# Patient Record
Sex: Female | Born: 1955 | Race: White | Hispanic: No | Marital: Married | Smoking: Never smoker
Health system: Southern US, Community
[De-identification: ages and names within clinical notes are randomized; demographics above are authoritative.]

## PROBLEM LIST (undated history)

## (undated) DIAGNOSIS — I890 Lymphedema, not elsewhere classified: Secondary | ICD-10-CM

## (undated) DIAGNOSIS — C73 Malignant neoplasm of thyroid gland: Secondary | ICD-10-CM

## (undated) DIAGNOSIS — I509 Heart failure, unspecified: Secondary | ICD-10-CM

## (undated) DIAGNOSIS — E119 Type 2 diabetes mellitus without complications: Secondary | ICD-10-CM

## (undated) DIAGNOSIS — E039 Hypothyroidism, unspecified: Secondary | ICD-10-CM

## (undated) DIAGNOSIS — G2581 Restless legs syndrome: Secondary | ICD-10-CM

## (undated) DIAGNOSIS — G4730 Sleep apnea, unspecified: Secondary | ICD-10-CM

## (undated) HISTORY — DX: Heart failure, unspecified: I50.9

## (undated) HISTORY — DX: Sleep apnea, unspecified: G47.30

## (undated) HISTORY — PX: THYROIDECTOMY: SHX17

## (undated) HISTORY — PX: ABDOMINAL HYSTERECTOMY: SHX81

## (undated) HISTORY — DX: Lymphedema, not elsewhere classified: I89.0

---

## 2003-06-18 ENCOUNTER — Other Ambulatory Visit: Payer: Self-pay

## 2004-05-07 ENCOUNTER — Ambulatory Visit: Payer: Self-pay | Admitting: Internal Medicine

## 2004-06-17 ENCOUNTER — Ambulatory Visit: Payer: Self-pay | Admitting: Internal Medicine

## 2004-10-02 ENCOUNTER — Ambulatory Visit: Payer: Self-pay | Admitting: Internal Medicine

## 2004-10-15 ENCOUNTER — Ambulatory Visit: Payer: Self-pay | Admitting: Pain Medicine

## 2004-11-03 ENCOUNTER — Ambulatory Visit: Payer: Self-pay | Admitting: Pain Medicine

## 2004-12-19 ENCOUNTER — Ambulatory Visit: Payer: Self-pay | Admitting: Internal Medicine

## 2005-01-07 ENCOUNTER — Ambulatory Visit: Payer: Self-pay | Admitting: Internal Medicine

## 2005-05-15 ENCOUNTER — Ambulatory Visit: Payer: Self-pay | Admitting: Internal Medicine

## 2005-06-18 ENCOUNTER — Ambulatory Visit: Payer: Self-pay | Admitting: Internal Medicine

## 2005-07-13 DIAGNOSIS — C73 Malignant neoplasm of thyroid gland: Secondary | ICD-10-CM

## 2005-07-13 HISTORY — DX: Malignant neoplasm of thyroid gland: C73

## 2005-09-17 ENCOUNTER — Ambulatory Visit: Payer: Self-pay | Admitting: Otolaryngology

## 2005-09-24 ENCOUNTER — Ambulatory Visit: Payer: Self-pay | Admitting: Otolaryngology

## 2005-11-09 ENCOUNTER — Ambulatory Visit: Payer: Self-pay | Admitting: Internal Medicine

## 2005-11-11 ENCOUNTER — Ambulatory Visit: Payer: Self-pay | Admitting: Internal Medicine

## 2005-11-13 ENCOUNTER — Ambulatory Visit: Payer: Self-pay | Admitting: Internal Medicine

## 2006-02-16 IMAGING — NM NM THYROID IMAGING W/ UPTAKE SINGLE (24 HR)
1 series · 3 of 3 positions shown · non-contrast
Comparison: none

REASON FOR EXAM: Thyroid nodule
COMMENTS:

[Series 0: thyroid · 2.4mm · 2.40mm/px · 3 of 3 frames shown]
[frame 1/3  full-range]
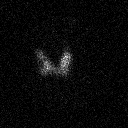
[frame 2/3  full-range]
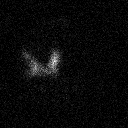
[frame 3/3  full-range]
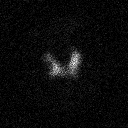

[3 of 3 positions shown; findings below may reference images not displayed]

PROCEDURE:     NM  - NM THYROID M-CMD 24 HR [DATE]  [DATE]

RESULT:     Following oral administration of 131.5 microcuries of
radioactive iodine M-CMD, the five-hour uptake measures 3.4% and the 24-hour
uptake measures 8.3%.  The six-hour uptake is below the normal range but
this is of doubtful clinical significance since the 24-hour uptake is within
the lower range of normal.

Thyroid scan was performed in the anterior and both oblique views.  There is
noted decreased tracer activity along the lateral margin of the midpole
region of the RIGHT lobe.  Since the patient reportedly has a palpable
nodule in the RIGHT lobe, the findings suggest a cold or at least
hypofunctioning nodule of the RIGHT lobe.  The thyroid lobes are otherwise
symmetrical.  There is noted normal tracer activity in the LEFT lobe.
IMPRESSION: There is decreased tracer activity along the lateral margin of the midpole
region of the RIGHT lobe of the thyroid consistent with a cold or
hypofunctioning nodule.

The six-hour thyroid uptake is below the normal range.  The finding is of
doubtful clinical significance since the 24-hour uptake measures 8.3% which
is in the normal range.

## 2007-05-24 ENCOUNTER — Ambulatory Visit: Payer: Self-pay | Admitting: Internal Medicine

## 2008-04-05 ENCOUNTER — Ambulatory Visit: Payer: Self-pay | Admitting: Orthopedic Surgery

## 2009-04-22 ENCOUNTER — Ambulatory Visit: Payer: Self-pay | Admitting: Internal Medicine

## 2010-04-08 ENCOUNTER — Ambulatory Visit: Payer: Self-pay | Admitting: Internal Medicine

## 2010-07-11 ENCOUNTER — Ambulatory Visit: Payer: Self-pay | Admitting: Unknown Physician Specialty

## 2010-07-16 LAB — PATHOLOGY REPORT

## 2011-04-14 ENCOUNTER — Ambulatory Visit: Payer: Self-pay | Admitting: Internal Medicine

## 2011-09-11 ENCOUNTER — Ambulatory Visit: Payer: Self-pay | Admitting: Internal Medicine

## 2011-10-16 ENCOUNTER — Ambulatory Visit: Payer: Self-pay | Admitting: Specialist

## 2012-04-01 ENCOUNTER — Ambulatory Visit: Payer: Self-pay | Admitting: Internal Medicine

## 2012-04-14 ENCOUNTER — Ambulatory Visit: Payer: Self-pay | Admitting: Internal Medicine

## 2013-04-17 ENCOUNTER — Ambulatory Visit: Payer: Self-pay | Admitting: Internal Medicine

## 2013-06-21 ENCOUNTER — Ambulatory Visit: Payer: Self-pay | Admitting: Otolaryngology

## 2014-01-15 ENCOUNTER — Emergency Department: Payer: Self-pay | Admitting: Emergency Medicine

## 2014-01-15 ENCOUNTER — Inpatient Hospital Stay: Payer: Self-pay | Admitting: Internal Medicine

## 2014-01-15 LAB — BASIC METABOLIC PANEL
Anion Gap: 9 (ref 7–16)
BUN: 24 mg/dL — ABNORMAL HIGH (ref 7–18)
CREATININE: 1.16 mg/dL (ref 0.60–1.30)
Calcium, Total: 8.5 mg/dL (ref 8.5–10.1)
Chloride: 106 mmol/L (ref 98–107)
Co2: 24 mmol/L (ref 21–32)
EGFR (Non-African Amer.): 51 — ABNORMAL LOW
GFR CALC AF AMER: 60 — AB
Glucose: 123 mg/dL — ABNORMAL HIGH (ref 65–99)
Osmolality: 283 (ref 275–301)
POTASSIUM: 4 mmol/L (ref 3.5–5.1)
SODIUM: 139 mmol/L (ref 136–145)

## 2014-01-15 LAB — CBC WITH DIFFERENTIAL/PLATELET
Basophil #: 0 10*3/uL (ref 0.0–0.1)
Basophil %: 0.2 %
EOS PCT: 1.5 %
Eosinophil #: 0.3 10*3/uL (ref 0.0–0.7)
HCT: 42.3 % (ref 35.0–47.0)
HGB: 13.2 g/dL (ref 12.0–16.0)
LYMPHS PCT: 3.6 %
Lymphocyte #: 0.7 10*3/uL — ABNORMAL LOW (ref 1.0–3.6)
MCH: 27.5 pg (ref 26.0–34.0)
MCHC: 31.3 g/dL — ABNORMAL LOW (ref 32.0–36.0)
MCV: 88 fL (ref 80–100)
MONO ABS: 0.3 x10 3/mm (ref 0.2–0.9)
Monocyte %: 1.5 %
NEUTROS PCT: 93.2 %
Neutrophil #: 18.7 10*3/uL — ABNORMAL HIGH (ref 1.4–6.5)
PLATELETS: 325 10*3/uL (ref 150–440)
RBC: 4.82 10*6/uL (ref 3.80–5.20)
RDW: 16.8 % — AB (ref 11.5–14.5)
WBC: 20 10*3/uL — ABNORMAL HIGH (ref 3.6–11.0)

## 2014-01-15 LAB — URINALYSIS, COMPLETE
Bilirubin,UR: NEGATIVE
GLUCOSE, UR: NEGATIVE mg/dL (ref 0–75)
Ketone: NEGATIVE
Nitrite: POSITIVE
PH: 5 (ref 4.5–8.0)
Protein: 100
SPECIFIC GRAVITY: 1.014 (ref 1.003–1.030)

## 2014-01-17 LAB — CBC WITH DIFFERENTIAL/PLATELET
Basophil #: 0 10*3/uL (ref 0.0–0.1)
Basophil %: 0.4 %
EOS ABS: 0 10*3/uL (ref 0.0–0.7)
Eosinophil %: 0.3 %
HCT: 39.1 % (ref 35.0–47.0)
HGB: 12.5 g/dL (ref 12.0–16.0)
LYMPHS ABS: 0.7 10*3/uL — AB (ref 1.0–3.6)
Lymphocyte %: 8.8 %
MCH: 27.6 pg (ref 26.0–34.0)
MCHC: 31.9 g/dL — ABNORMAL LOW (ref 32.0–36.0)
MCV: 87 fL (ref 80–100)
MONOS PCT: 7 %
Monocyte #: 0.6 x10 3/mm (ref 0.2–0.9)
Neutrophil #: 6.8 10*3/uL — ABNORMAL HIGH (ref 1.4–6.5)
Neutrophil %: 83.5 %
PLATELETS: 275 10*3/uL (ref 150–440)
RBC: 4.51 10*6/uL (ref 3.80–5.20)
RDW: 17.1 % — AB (ref 11.5–14.5)
WBC: 8.2 10*3/uL (ref 3.6–11.0)

## 2014-01-17 LAB — COMPREHENSIVE METABOLIC PANEL
ALT: 17 U/L (ref 12–78)
ANION GAP: 10 (ref 7–16)
AST: 31 U/L (ref 15–37)
Albumin: 2.6 g/dL — ABNORMAL LOW (ref 3.4–5.0)
Alkaline Phosphatase: 85 U/L
BILIRUBIN TOTAL: 0.3 mg/dL (ref 0.2–1.0)
BUN: 14 mg/dL (ref 7–18)
Calcium, Total: 8.5 mg/dL (ref 8.5–10.1)
Chloride: 102 mmol/L (ref 98–107)
Co2: 25 mmol/L (ref 21–32)
Creatinine: 1.09 mg/dL (ref 0.60–1.30)
GFR CALC NON AF AMER: 56 — AB
Glucose: 97 mg/dL (ref 65–99)
OSMOLALITY: 274 (ref 275–301)
Potassium: 3.8 mmol/L (ref 3.5–5.1)
Sodium: 137 mmol/L (ref 136–145)
Total Protein: 6.9 g/dL (ref 6.4–8.2)

## 2014-01-17 LAB — URINE CULTURE

## 2014-01-18 LAB — CBC WITH DIFFERENTIAL/PLATELET
BASOS ABS: 0.1 10*3/uL (ref 0.0–0.1)
BASOS PCT: 0.9 %
Eosinophil #: 0.2 10*3/uL (ref 0.0–0.7)
Eosinophil %: 2.2 %
HCT: 36.8 % (ref 35.0–47.0)
HGB: 12 g/dL (ref 12.0–16.0)
LYMPHS PCT: 16.8 %
Lymphocyte #: 1.4 10*3/uL (ref 1.0–3.6)
MCH: 28.7 pg (ref 26.0–34.0)
MCHC: 32.6 g/dL (ref 32.0–36.0)
MCV: 88 fL (ref 80–100)
MONO ABS: 1 x10 3/mm — AB (ref 0.2–0.9)
MONOS PCT: 12.1 %
NEUTROS PCT: 68 %
Neutrophil #: 5.5 10*3/uL (ref 1.4–6.5)
Platelet: 310 10*3/uL (ref 150–440)
RBC: 4.17 10*6/uL (ref 3.80–5.20)
RDW: 16.2 % — AB (ref 11.5–14.5)
WBC: 8.1 10*3/uL (ref 3.6–11.0)

## 2014-01-18 LAB — BASIC METABOLIC PANEL
Anion Gap: 8 (ref 7–16)
BUN: 14 mg/dL (ref 7–18)
CALCIUM: 8.2 mg/dL — AB (ref 8.5–10.1)
CREATININE: 1.14 mg/dL (ref 0.60–1.30)
Chloride: 104 mmol/L (ref 98–107)
Co2: 26 mmol/L (ref 21–32)
EGFR (African American): >60
GFR CALC NON AF AMER: 53 — AB
GLUCOSE: 104 mg/dL — AB (ref 65–99)
OSMOLALITY: 276 (ref 275–301)
Potassium: 3.7 mmol/L (ref 3.5–5.1)
Sodium: 138 mmol/L (ref 136–145)

## 2014-01-20 LAB — CULTURE, BLOOD (SINGLE)

## 2014-04-18 ENCOUNTER — Ambulatory Visit: Payer: Self-pay | Admitting: Internal Medicine

## 2014-07-27 ENCOUNTER — Ambulatory Visit: Payer: Self-pay | Admitting: Surgery

## 2014-11-03 NOTE — Discharge Summary (Signed)
PATIENT NAME:  Madison Mcbride, Madison Mcbride MR#:  [id number] DATE OF BIRTH:  08/24/1955  DATE OF ADMISSION:  01/15/2014 DATE OF DISCHARGE:  01/18/2014  DISCHARGE DIAGNOSES: 1.  Escherichia coli sepsis from urinary tract infection.  2.  Hypertension.  3.  Hypothyroidism.  4.  Restless leg syndrome.   DISCHARGE MEDICATIONS:  1.  Multiple vitamin daily. 2.  Diovan HCT 160/12.5 one daily. 3.  Maxzide-50 daily. 4.  Wellbutrin XL 150 mg b.i.d. 5.  Vitamin D3 1000 units daily. 6.  Mirapex 0.125 mg b.i.d. p.r.n. restless legs.  7.  Synthroid 150 mcg daily.  8.  Levaquin 750 mg daily x5 days.   REASON FOR ADMISSION: A 58 year old female who presents with sepsis. Please see H and P for HPI, past medical history, and physical exam.   HOSPITAL COURSE: The patient was admitted, found to have a significant UTI with positive blood cultures and urine cultures for E. coli. She received IV Levaquin and Rocephin. Her white count was 17,000, came down to 8,000 and she defervesced prior to discharge. She will be going home on p.o. Levaquin and to follow up with Dr. Sabra Heck in 1 week. Overall prognosis is good.   ____________________________ Rusty Aus, MD mfm:sb D: 01/18/2014 07:57:04 ET T: 01/18/2014 08:41:12 ET JOB#: [id number]  cc: Rusty Aus, MD, <Dictator> [place] MD ELECTRONICALLY SIGNED 01/19/2014 8:02

## 2014-11-03 NOTE — H&P (Signed)
PATIENT NAME:  Madison Mcbride, Madison Mcbride MR#:  [id number] DATE OF BIRTH:  [birth year]  DATE OF ADMISSION:  01/15/2014  REFERRING PHYSICIAN: Dr. Joni Fears  PRIMARY CARE PHYSICIAN: Dr. Emily Filbert, [facility].   CHIEF COMPLAINT: Fever.   HISTORY OF PRESENT ILLNESS: The patient is a 59 year old Caucasian female with past medical history of hypothyroidism, hypertension, presenting with fever. She had 2 to 3 day duration of fevers with shaking chills, noted dysuria with increased urinary frequency. Denies any abdominal pain, flank pain, or further symptomatology. She was actually evaluated in the Emergency Department, subsequently discharged, informed that she had positive blood cultures and asked to return.   REVIEW OF SYSTEMS:  CONSTITUTIONAL: Positive for fevers, chills. Denies fatigue, weakness.  EYES: Denies blurred vision, double vision. ENT:  Denies tinnitus, ear pain, hearing loss. RESPIRATORY: Denies cough or shortness of breath.  CARDIOVASCULAR: Denies chest pain, palpitations, edema. GASTROINTESTINAL: Denies nausea, vomiting, diarrhea, abdominal pain.  GENITOURINARY: Positive for dysuria and increased urinary frequency.  ENDOCRINE: Denies nocturia or thyroid problems.  HEMATOLOGIC AND LYMPHATIC: Denies easy bruising, bleeding.  SKIN: Denies rash or lesion.  MUSCULOSKELETAL: Denies pain in neck, back, shoulders, knees, hips or arthritic symptoms.  NEUROLOGIC: Denies paralysis, paresthesias. PSYCHIATRIC: Denies anxiety or depression. Otherwise, full review of systems performed by me is negative.  PAST MEDICAL HISTORY: Hypothyroidism, hypertension.   SOCIAL HISTORY: Denies alcohol, tobacco, or drug usage.   FAMILY HISTORY: Denies any knowledge of cardiovascular or pulmonary disorders.   ALLERGIES: No known drug allergies.   HOME MEDICATIONS: Include hydrochlorothiazide/triamterene 50/75 mg p.o. daily, hydrochlorothiazide/valsartan 12.5/160 mg p.o. daily, pramipexole 0.125 mg p.o. 3  times daily as needed, bupropion 150 mg p.o. b.i.d., Synthroid 150 mcg p.o. daily, multivitamin 1 tab p.o. daily, biotin 5000 mcg p.o. daily, vitamin D3 1000 international units p.o. daily.   PHYSICAL EXAMINATION: VITAL SIGNS: Temperature 99, heart rate 88, respirations 18, blood pressure 192/92, initial temperature 100.1, initial heart rate 109, respirations 20, blood pressure 174/79, saturating 98% on room air. Currently 99 temperature, heart rate 88, respirations 18, blood pressure 192/92, saturating 98% on room air. Weight 133.8 kg, BUN 50.7.  GENERAL: Well-nourished, obese Caucasian female, currently in no acute distress.  HEAD: Normocephalic, atraumatic.  EYES: Pupils equal, round, reactive to light. Extraocular movements intact. No scleral icterus.  MOUTH: Moist mucous membranes. Dentition intact. No abscess noted.  EARS, NOSE, AND THROAT: Clear, without exudates. No external lesions.  NECK: Supple. No thyromegaly. No nodules. No JVD.  PULMONARY: Clear to auscultation bilaterally without wheeze, rales, rhonchi. No use of accessory muscles. Good respiratory effort.  CHEST: Nontender to palpation.  CARDIOVASCULAR: S1, S2. Regular rate and rhythm. No murmurs, rubs, or gallops. No edema. Pedal pulses 2+ bilaterally.  GASTROINTESTINAL: Soft, obese, nontender, nondistended. No masses. Positive bowel sounds. No hepatosplenomegaly.  MUSCULOSKELETAL: No swelling, clubbing, edema. Range of motion full in all extremities. NEUROLOGIC: Cranial nerves II through XII are intact. No gross focal neurological deficit. Sensation intact. Reflexes intact.  SKIN: No ulceration, lesions, rash, cyanosis. Skin warm, dry. Turgor intact.  PSYCHIATRIC: Mood and affect within normal limits. The patient is awake, alert, oriented x3. Insight and judgment intact.   LABORATORY DATA: Sodium 139, potassium 4, chloride 106, bicarbonate 24, BUN 24, creatinine 1.16, glucose 123, WBC 20, hemoglobin 13.2, platelets 325.  Urinalysis: WBCs too numerous to count, RBCs 0 to 5, leukocyte esterase 2+, nitrite positive, epithelial cells 0. Blood culture positive for gram-negative rods.   ASSESSMENT AND PLAN: A 59 year old Caucasian female with history of  hypothyroidism, hypertension, presenting with fever.  1.  Sepsis due to urinary tract infection, meeting septic criteria by fever and leukocytosis. Follow culture data already obtained, antibiotic coverage with ceftriaxone, IV fluid hydration, keep mean arterail pressure greater than 65 2.  Hypertension. Add hydralazine 10 mg IV p.r.n. for systolic greater than 504, diastolic greater than 136. Continue home medications.  3.  Hypothyroidism. Continue Synthroid.  4.  Deep venous thrombosis prophylaxis with heparin subcutaneous.   CODE STATUS: The patient is full code.  TIME SPENT: 45 minutes.   ____________________________ Aaron Mose. Braiden Presutti, MD dkh:cg D: 01/15/2014 43:83:77 ET T: 01/16/2014 02:22:11 ET JOB#: [id number]  cc: Aaron Mose. Azalie Harbeck, MD, <Dictator> Kiyani Jernigan Woodfin Ganja MD ELECTRONICALLY SIGNED 01/17/2014 3:24

## 2014-11-14 ENCOUNTER — Other Ambulatory Visit: Payer: Self-pay | Admitting: Internal Medicine

## 2014-11-14 DIAGNOSIS — Z1231 Encounter for screening mammogram for malignant neoplasm of breast: Secondary | ICD-10-CM

## 2015-04-22 ENCOUNTER — Ambulatory Visit
Admission: RE | Admit: 2015-04-22 | Discharge: 2015-04-22 | Disposition: A | Discharge status: Home / Self Care | Payer: BLUE CROSS/BLUE SHIELD | Source: Ambulatory Visit | Attending: Internal Medicine | Admitting: Internal Medicine

## 2015-04-22 DIAGNOSIS — Z1231 Encounter for screening mammogram for malignant neoplasm of breast: Secondary | ICD-10-CM | POA: Diagnosis not present

## 2015-04-22 HISTORY — DX: Malignant neoplasm of thyroid gland: C73

## 2015-08-23 ENCOUNTER — Encounter: Payer: Self-pay | Admitting: *Deleted

## 2015-08-26 ENCOUNTER — Ambulatory Visit: Payer: BLUE CROSS/BLUE SHIELD | Admitting: Anesthesiology

## 2015-08-26 ENCOUNTER — Encounter
Admission: RE | Disposition: A | Discharge status: Home / Self Care | Payer: Self-pay | Source: Ambulatory Visit | Attending: Unknown Physician Specialty

## 2015-08-26 ENCOUNTER — Ambulatory Visit
Admission: RE | Admit: 2015-08-26 | Discharge: 2015-08-26 | Disposition: A | Discharge status: Home / Self Care | Payer: BLUE CROSS/BLUE SHIELD | Source: Ambulatory Visit | Attending: Unknown Physician Specialty | Admitting: Unknown Physician Specialty

## 2015-08-26 ENCOUNTER — Encounter: Payer: Self-pay | Admitting: *Deleted

## 2015-08-26 DIAGNOSIS — Z800 Family history of malignant neoplasm of digestive organs: Secondary | ICD-10-CM | POA: Diagnosis not present

## 2015-08-26 DIAGNOSIS — K640 First degree hemorrhoids: Secondary | ICD-10-CM | POA: Diagnosis not present

## 2015-08-26 DIAGNOSIS — Z79899 Other long term (current) drug therapy: Secondary | ICD-10-CM | POA: Insufficient documentation

## 2015-08-26 DIAGNOSIS — K635 Polyp of colon: Secondary | ICD-10-CM | POA: Insufficient documentation

## 2015-08-26 DIAGNOSIS — K621 Rectal polyp: Secondary | ICD-10-CM | POA: Insufficient documentation

## 2015-08-26 DIAGNOSIS — Z90710 Acquired absence of both cervix and uterus: Secondary | ICD-10-CM | POA: Diagnosis not present

## 2015-08-26 DIAGNOSIS — Z1211 Encounter for screening for malignant neoplasm of colon: Secondary | ICD-10-CM | POA: Diagnosis present

## 2015-08-26 DIAGNOSIS — D120 Benign neoplasm of cecum: Secondary | ICD-10-CM | POA: Insufficient documentation

## 2015-08-26 DIAGNOSIS — E119 Type 2 diabetes mellitus without complications: Secondary | ICD-10-CM | POA: Insufficient documentation

## 2015-08-26 DIAGNOSIS — G2581 Restless legs syndrome: Secondary | ICD-10-CM | POA: Insufficient documentation

## 2015-08-26 DIAGNOSIS — K5730 Diverticulosis of large intestine without perforation or abscess without bleeding: Secondary | ICD-10-CM | POA: Diagnosis not present

## 2015-08-26 DIAGNOSIS — E039 Hypothyroidism, unspecified: Secondary | ICD-10-CM | POA: Diagnosis not present

## 2015-08-26 HISTORY — DX: Hypothyroidism, unspecified: E03.9

## 2015-08-26 HISTORY — DX: Restless legs syndrome: G25.81

## 2015-08-26 HISTORY — DX: Type 2 diabetes mellitus without complications: E11.9

## 2015-08-26 HISTORY — PX: COLONOSCOPY WITH PROPOFOL: SHX5780

## 2015-08-26 LAB — GLUCOSE, CAPILLARY: Glucose-Capillary: 104 mg/dL — ABNORMAL HIGH (ref 65–99)

## 2015-08-26 SURGERY — COLONOSCOPY WITH PROPOFOL
Anesthesia: General

## 2015-08-26 MED ORDER — PROPOFOL 10 MG/ML IV BOLUS
INTRAVENOUS | Status: DC | PRN
  Administered 2015-08-26: 50 mg via INTRAVENOUS

## 2015-08-26 MED ORDER — FENTANYL CITRATE (PF) 100 MCG/2ML IJ SOLN
INTRAMUSCULAR | Status: DC | PRN
  Administered 2015-08-26: 50 ug via INTRAVENOUS

## 2015-08-26 MED ORDER — MIDAZOLAM HCL 5 MG/5ML IJ SOLN
INTRAMUSCULAR | Status: DC | PRN
Start: 2015-08-26 — End: 2015-08-26
  Administered 2015-08-26: 1 mg via INTRAVENOUS

## 2015-08-26 MED ORDER — SODIUM CHLORIDE 0.9 % IV SOLN
INTRAVENOUS | Status: DC
  Administered 2015-08-26: 14:00:00 via INTRAVENOUS

## 2015-08-26 MED ORDER — LIDOCAINE HCL (PF) 2 % IJ SOLN
INTRAMUSCULAR | Status: DC | PRN
  Administered 2015-08-26: 60 mg

## 2015-08-26 MED ORDER — SODIUM CHLORIDE 0.9 % IV SOLN: INTRAVENOUS | Status: DC

## 2015-08-26 MED ORDER — PROPOFOL 500 MG/50ML IV EMUL
INTRAVENOUS | Status: DC | PRN
  Administered 2015-08-26: 100 ug/kg/min via INTRAVENOUS

## 2015-08-26 NOTE — Anesthesia Preprocedure Evaluation (Signed)
Anesthesia Evaluation  Patient identified by MRN, date of birth, ID band Patient awake    Reviewed: Allergy & Precautions, NPO status , Patient's Chart, lab work & pertinent test results  Airway Mallampati: II  TM Distance: >3 FB Neck ROM: Full    Dental no notable dental hx.    Pulmonary neg pulmonary ROS,    Pulmonary exam normal breath sounds clear to auscultation       Cardiovascular Normal cardiovascular exam     Neuro/Psych negative neurological ROS  negative psych ROS   GI/Hepatic negative GI ROS, Neg liver ROS,   Endo/Other  diabetes, Well Controlled, Type 2Hypothyroidism Thyroid CA  Renal/GU negative Renal ROS  negative genitourinary   Musculoskeletal negative musculoskeletal ROS (+)   Abdominal Normal abdominal exam  (+)   Peds negative pediatric ROS (+)  Hematology negative hematology ROS (+)   Anesthesia Other Findings   Reproductive/Obstetrics                             Anesthesia Physical Anesthesia Plan  ASA: III  Anesthesia Plan: General   Post-op Pain Management:    Induction: Intravenous  Airway Management Planned: Nasal Cannula  Additional Equipment:   Intra-op Plan:   Post-operative Plan:   Informed Consent: I have reviewed the patients History and Physical, chart, labs and discussed the procedure including the risks, benefits and alternatives for the proposed anesthesia with the patient or authorized representative who has indicated his/her understanding and acceptance.   Dental advisory given  Plan Discussed with: CRNA and Surgeon  Anesthesia Plan Comments:         Anesthesia Quick Evaluation

## 2015-08-26 NOTE — H&P (Signed)
Primary Care Physician:  Rusty Aus., MD Primary Gastroenterologist:  Dr. Vira Agar  Pre-Procedure History & Physical: HPI:  Madison Mcbride is a 60 y.o. female is here for an colonoscopy.   Past Medical History  Diagnosis Date  . Thyroid cancer ([place]) 2007  . Diabetes mellitus without complication ([place])   . Hypothyroidism   . RLS (restless legs syndrome)     Past Surgical History  Procedure Laterality Date  . Abdominal hysterectomy    . Thyroidectomy      Prior to Admission medications   Medication Sig Start Date End Date Taking? Authorizing Provider  Biotin 2.5 MG CAPS Take 5,000 capsules by mouth as directed.   Yes Historical Provider, MD  buPROPion (WELLBUTRIN XL) 150 MG 24 hr tablet Take 150 mg by mouth daily.   Yes Historical Provider, MD  cholecalciferol (VITAMIN D) 400 units TABS tablet Take 1,000 Units by mouth.   Yes Historical Provider, MD  levothyroxine (SYNTHROID, LEVOTHROID) 150 MCG tablet Take 150 mcg by mouth daily before breakfast.   Yes Historical Provider, MD  metFORMIN (GLUCOPHAGE) 500 MG tablet Take 250 mg by mouth daily with breakfast.   Yes Historical Provider, MD  Multiple Vitamin (MULTIVITAMIN) tablet Take 1 tablet by mouth daily.   Yes Historical Provider, MD  pramipexole (MIRAPEX) 0.25 MG tablet Take 0.25 mg by mouth 2 (two) times daily.   Yes Historical Provider, MD  senna-docusate (SENOKOT-S) 8.6-50 MG tablet Take 1 tablet by mouth 2 (two) times daily.   Yes Historical Provider, MD  traMADol (ULTRAM) 50 MG tablet Take by mouth every 6 (six) hours as needed.   Yes Historical Provider, MD  triamterene-hydrochlorothiazide (MAXZIDE) 75-50 MG tablet Take 1 tablet by mouth daily.   Yes Historical Provider, MD  valsartan-hydrochlorothiazide (DIOVAN-HCT) 160-12.5 MG tablet Take 1 tablet by mouth daily.   Yes Historical Provider, MD  zinc gluconate 50 MG tablet Take 50 mg by mouth daily.   Yes Historical Provider, MD  Liraglutide -Weight Management  (SAXENDA) 18 MG/3ML SOPN Inject 3 mg into the skin daily at 6 (six) AM. Reported on 08/26/2015    Historical Provider, MD    Allergies as of 06/25/2015  . (Not on File)    History reviewed. No pertinent family history.  Social History   Social History  . Marital Status: Married    Spouse Name: N/A  . Number of Children: N/A  . Years of Education: N/A   Occupational History  . Not on file.   Social History Main Topics  . Smoking status: Never Smoker   . Smokeless tobacco: Never Used  . Alcohol Use: No  . Drug Use: No  . Sexual Activity: Not on file   Other Topics Concern  . Not on file   Social History Narrative    Review of Systems: See HPI, otherwise negative ROS  Physical Exam: BP 166/95 mmHg  Pulse 87  Temp(Src) 98.5 F (36.9 C) (Tympanic)  Resp 14  Ht 5\' 5"  (1.651 m)  Wt 139.708 kg (308 lb)  BMI 51.25 kg/m2  SpO2 99% General:   Alert,  pleasant and cooperative in NAD Head:  Normocephalic and atraumatic. Neck:  Supple; no masses or thyromegaly. Lungs:  Clear throughout to auscultation.    Heart:  Regular rate and rhythm. Abdomen:  Soft, nontender and nondistended. Normal bowel sounds, without guarding, and without rebound.  Patient very obese. Neurologic:  Alert and  oriented x4;  grossly normal neurologically.  Impression/Plan: Madison Mcbride is here  for an colonoscopy to be performed for FH colon cancer  Risks, benefits, limitations, and alternatives regarding  colonoscopy have been reviewed with the patient.  Questions have been answered.  All parties agreeable.   Gaylyn Cheers, MD  08/26/2015, 2:00 PM

## 2015-08-26 NOTE — Transfer of Care (Signed)
Immediate Anesthesia Transfer of Care Note  Patient: Madison Mcbride  Procedure(s) Performed: Procedure(s): COLONOSCOPY WITH PROPOFOL (N/A)  Patient Location: PACU  Anesthesia Type:General  Level of Consciousness: sedated  Airway & Oxygen Therapy: Patient Spontanous Breathing and Patient connected to nasal cannula oxygen  Post-op Assessment: Report given to RN and Post -op Vital signs reviewed and stable  Post vital signs: Reviewed and stable  Last Vitals:  Filed Vitals:   08/26/15 1317  BP: 166/95  Pulse: 87  Temp: 36.9 C  Resp: 14    Complications: No apparent anesthesia complications

## 2015-08-26 NOTE — Op Note (Signed)
[facility]astroenter[facility] Patient Name: Madison Mcbride Procedure Date: 08/26/2015 10:49 AM MRN: [id number] Account #: [id number] Date of Birth: April 26, 1956 Admit Type: Outpatient Age: 60 Room: [facility] ENDO ROOM 1 Gender: Female Note Status: Finalized Procedure:         Colonoscopy Indications:       Screening in patient at increased risk: Family history of                     1st-degree relative with colorectal cancer Providers:         Manya Silvas, MD Referring MD:      Rusty Aus, MD (Referring MD) Medicines:         Propofol per Anesthesia Complications:     No immediate complications. Procedure:         Pre-Anesthesia Assessment:                    - After reviewing the risks and benefits, the patient was                     deemed in satisfactory condition to undergo the procedure.                    After obtaining informed consent, the colonoscope was                     passed under direct vision. Throughout the procedure, the                     patient's blood pressure, pulse, and oxygen saturations                     were monitored continuously. The Colonoscope was                     introduced through the anus and advanced to the the cecum,                     identified by appendiceal orifice and ileocecal valve. The                     colonoscopy was performed without difficulty. The patient                     tolerated the procedure well. The quality of the bowel                     preparation was adequate to identify polyps. Findings:      Two sessile polyps were found in the cecum. The polyps were diminutive       in size. These polyps were removed with a jumbo cold forceps. Resection       and retrieval were complete.      A diminutive polyp was found in the descending colon. The polyp was       sessile. The polyp was removed with a jumbo cold forceps. Resection and       retrieval were complete.      A diminutive polyp was found  in the rectum. The polyp was sessile. The       polyp was removed with a jumbo cold forceps. Resection and retrieval       were complete.      Multiple small and large-mouthed diverticula were found in the sigmoid  colon, descending colon, transverse colon and ascending colon.      Internal hemorrhoids were found during endoscopy. The hemorrhoids were       small and Grade I (internal hemorrhoids that do not prolapse). Impression:        - Two diminutive polyps in the cecum, removed with a jumbo                     cold forceps. Resected and retrieved.                    - One diminutive polyp in the descending colon, removed                     with a jumbo cold forceps. Resected and retrieved.                    - One diminutive polyp in the rectum, removed with a jumbo                     cold forceps. Resected and retrieved.                    - Diverticulosis in the sigmoid colon, in the descending                     colon, in the transverse colon and in the ascending colon.                    - Internal hemorrhoids. Recommendation:    - Await pathology results. Manya Silvas, MD 08/26/2015 2:31:11 PM This report has been signed electronically. Number of Addenda: 0 Note Initiated On: 08/26/2015 10:49 AM Scope Withdrawal Time: 0 hours 11 minutes 58 seconds  Total Procedure Duration: 0 hours 18 minutes 31 seconds       [facility]

## 2015-08-28 ENCOUNTER — Encounter: Payer: Self-pay | Admitting: Unknown Physician Specialty

## 2015-08-28 LAB — SURGICAL PATHOLOGY

## 2015-08-28 NOTE — Anesthesia Postprocedure Evaluation (Signed)
Anesthesia Post Note  Patient: Madison Mcbride  Procedure(s) Performed: Procedure(s) (LRB): COLONOSCOPY WITH PROPOFOL (N/A)  Patient location during evaluation: PACU Anesthesia Type: General Level of consciousness: awake and alert and oriented Pain management: pain level controlled Vital Signs Assessment: post-procedure vital signs reviewed and stable Respiratory status: spontaneous breathing Cardiovascular status: blood pressure returned to baseline Anesthetic complications: no    Last Vitals:  Filed Vitals:   08/26/15 1450 08/26/15 1500  BP: 143/88 117/99  Pulse: 76 72  Temp:    Resp: 20 19    Last Pain: There were no vitals filed for this visit.               Shakemia Madera

## 2016-03-05 ENCOUNTER — Other Ambulatory Visit: Payer: Self-pay | Admitting: Orthopedic Surgery

## 2016-03-05 DIAGNOSIS — M25561 Pain in right knee: Secondary | ICD-10-CM

## 2016-03-24 ENCOUNTER — Ambulatory Visit
Admission: RE | Admit: 2016-03-24 | Discharge: 2016-03-24 | Disposition: A | Discharge status: Home / Self Care | Payer: BLUE CROSS/BLUE SHIELD | Source: Ambulatory Visit | Attending: Orthopedic Surgery | Admitting: Orthopedic Surgery

## 2016-03-24 DIAGNOSIS — S83241A Other tear of medial meniscus, current injury, right knee, initial encounter: Secondary | ICD-10-CM | POA: Diagnosis not present

## 2016-03-24 DIAGNOSIS — M1711 Unilateral primary osteoarthritis, right knee: Secondary | ICD-10-CM | POA: Insufficient documentation

## 2016-03-24 DIAGNOSIS — M25561 Pain in right knee: Secondary | ICD-10-CM | POA: Diagnosis not present

## 2016-03-24 DIAGNOSIS — X58XXXA Exposure to other specified factors, initial encounter: Secondary | ICD-10-CM | POA: Insufficient documentation

## 2016-03-24 DIAGNOSIS — M7121 Synovial cyst of popliteal space [Baker], right knee: Secondary | ICD-10-CM | POA: Diagnosis not present

## 2016-05-18 ENCOUNTER — Other Ambulatory Visit: Payer: Self-pay | Admitting: Internal Medicine

## 2016-05-18 DIAGNOSIS — I10 Essential (primary) hypertension: Secondary | ICD-10-CM | POA: Insufficient documentation

## 2016-05-18 DIAGNOSIS — R600 Localized edema: Secondary | ICD-10-CM

## 2016-05-19 ENCOUNTER — Other Ambulatory Visit: Payer: Self-pay | Admitting: Internal Medicine

## 2016-05-19 DIAGNOSIS — Z1231 Encounter for screening mammogram for malignant neoplasm of breast: Secondary | ICD-10-CM

## 2016-05-22 ENCOUNTER — Ambulatory Visit
Admission: RE | Admit: 2016-05-22 | Discharge: 2016-05-22 | Disposition: A | Discharge status: Home / Self Care | Payer: BLUE CROSS/BLUE SHIELD | Source: Ambulatory Visit | Attending: Internal Medicine | Admitting: Internal Medicine

## 2016-05-22 DIAGNOSIS — R600 Localized edema: Secondary | ICD-10-CM | POA: Diagnosis not present

## 2016-05-22 DIAGNOSIS — R937 Abnormal findings on diagnostic imaging of other parts of musculoskeletal system: Secondary | ICD-10-CM | POA: Insufficient documentation

## 2016-06-18 ENCOUNTER — Other Ambulatory Visit: Payer: Self-pay | Admitting: Unknown Physician Specialty

## 2016-06-18 DIAGNOSIS — M545 Low back pain: Secondary | ICD-10-CM

## 2016-06-22 ENCOUNTER — Ambulatory Visit
Admission: RE | Admit: 2016-06-22 | Discharge: 2016-06-22 | Disposition: A | Discharge status: Home / Self Care | Payer: BLUE CROSS/BLUE SHIELD | Source: Ambulatory Visit | Attending: Internal Medicine | Admitting: Internal Medicine

## 2016-06-22 ENCOUNTER — Other Ambulatory Visit: Payer: Self-pay | Admitting: Unknown Physician Specialty

## 2016-06-22 DIAGNOSIS — M545 Low back pain: Secondary | ICD-10-CM

## 2016-06-22 DIAGNOSIS — Z1231 Encounter for screening mammogram for malignant neoplasm of breast: Secondary | ICD-10-CM | POA: Insufficient documentation

## 2016-07-08 ENCOUNTER — Ambulatory Visit
Admission: RE | Admit: 2016-07-08 | Discharge: 2016-07-08 | Disposition: A | Discharge status: Home / Self Care | Payer: BLUE CROSS/BLUE SHIELD | Source: Ambulatory Visit | Attending: Unknown Physician Specialty | Admitting: Unknown Physician Specialty

## 2016-07-08 DIAGNOSIS — M48061 Spinal stenosis, lumbar region without neurogenic claudication: Secondary | ICD-10-CM | POA: Insufficient documentation

## 2016-07-08 DIAGNOSIS — M5136 Other intervertebral disc degeneration, lumbar region: Secondary | ICD-10-CM | POA: Insufficient documentation

## 2016-07-08 DIAGNOSIS — M4316 Spondylolisthesis, lumbar region: Secondary | ICD-10-CM | POA: Diagnosis not present

## 2016-07-08 DIAGNOSIS — M545 Low back pain: Secondary | ICD-10-CM | POA: Insufficient documentation

## 2016-08-21 DIAGNOSIS — I890 Lymphedema, not elsewhere classified: Secondary | ICD-10-CM | POA: Insufficient documentation

## 2016-10-12 ENCOUNTER — Ambulatory Visit: Payer: BLUE CROSS/BLUE SHIELD | Admitting: Physical Therapy

## 2016-10-22 ENCOUNTER — Ambulatory Visit: Payer: BLUE CROSS/BLUE SHIELD | Admitting: Physical Therapy

## 2016-10-27 ENCOUNTER — Ambulatory Visit: Payer: BLUE CROSS/BLUE SHIELD | Admitting: Physical Therapy

## 2016-10-29 ENCOUNTER — Ambulatory Visit: Payer: BLUE CROSS/BLUE SHIELD | Admitting: Physical Therapy

## 2017-06-01 ENCOUNTER — Other Ambulatory Visit: Payer: Self-pay | Admitting: Internal Medicine

## 2017-06-01 DIAGNOSIS — Z1231 Encounter for screening mammogram for malignant neoplasm of breast: Secondary | ICD-10-CM

## 2017-06-24 ENCOUNTER — Ambulatory Visit
Admission: RE | Admit: 2017-06-24 | Discharge: 2017-06-24 | Disposition: A | Discharge status: Home / Self Care | Payer: BLUE CROSS/BLUE SHIELD | Source: Ambulatory Visit | Attending: Internal Medicine | Admitting: Internal Medicine

## 2017-06-24 DIAGNOSIS — Z1231 Encounter for screening mammogram for malignant neoplasm of breast: Secondary | ICD-10-CM | POA: Diagnosis not present

## 2017-07-08 ENCOUNTER — Other Ambulatory Visit: Payer: Self-pay

## 2017-07-08 ENCOUNTER — Emergency Department
Admission: EM | Admit: 2017-07-08 | Discharge: 2017-07-08 | Disposition: A | Discharge status: Home / Self Care | Payer: BLUE CROSS/BLUE SHIELD | Attending: Emergency Medicine | Admitting: Emergency Medicine

## 2017-07-08 DIAGNOSIS — E039 Hypothyroidism, unspecified: Secondary | ICD-10-CM | POA: Insufficient documentation

## 2017-07-08 DIAGNOSIS — Z7984 Long term (current) use of oral hypoglycemic drugs: Secondary | ICD-10-CM | POA: Insufficient documentation

## 2017-07-08 DIAGNOSIS — Y999 Unspecified external cause status: Secondary | ICD-10-CM | POA: Diagnosis not present

## 2017-07-08 DIAGNOSIS — Y92011 Dining room of single-family (private) house as the place of occurrence of the external cause: Secondary | ICD-10-CM | POA: Insufficient documentation

## 2017-07-08 DIAGNOSIS — Y9384 Activity, sleeping: Secondary | ICD-10-CM | POA: Insufficient documentation

## 2017-07-08 DIAGNOSIS — W07XXXA Fall from chair, initial encounter: Secondary | ICD-10-CM | POA: Insufficient documentation

## 2017-07-08 DIAGNOSIS — Z79899 Other long term (current) drug therapy: Secondary | ICD-10-CM | POA: Insufficient documentation

## 2017-07-08 DIAGNOSIS — S0181XA Laceration without foreign body of other part of head, initial encounter: Secondary | ICD-10-CM | POA: Diagnosis present

## 2017-07-08 DIAGNOSIS — E119 Type 2 diabetes mellitus without complications: Secondary | ICD-10-CM | POA: Diagnosis not present

## 2017-07-08 MED ORDER — CEPHALEXIN 500 MG PO CAPS: 500.0000 mg | ORAL_CAPSULE | Freq: Two times a day (BID) | ORAL | 0 refills | Status: AC

## 2017-07-08 MED ORDER — BACITRACIN-NEOMYCIN-POLYMYXIN 400-5-5000 EX OINT
TOPICAL_OINTMENT | CUTANEOUS | Status: AC
  Filled 2017-07-08: qty 1

## 2017-07-08 MED ORDER — LIDOCAINE HCL (PF) 1 % IJ SOLN
INTRAMUSCULAR | Status: AC
  Filled 2017-07-08: qty 5

## 2017-07-08 NOTE — ED Triage Notes (Addendum)
Pt fell asleep on the recliner and fell forward hitting furniture to left face. Lac noted to left jaw area with small amt of bleeding noted.

## 2017-07-08 NOTE — ED Notes (Signed)
Pt to the ER for lac to the left side of the face from the side of the nose towards the side of the mouth in the laugh line. Bleeding noted. Wound is deep and long but edges are straight.

## 2017-07-08 NOTE — ED Provider Notes (Signed)
[facility] Emergency Department Provider Note    First MD Initiated Contact with Patient 07/08/17 0308     (approximate)  I have reviewed the triage vital signs and the nursing notes.   HISTORY  Chief Complaint Laceration    HPI Madison Mcbride is a 61 y.o. female with below list of chronic medical conditions presents to the emergency department with left facial laceration.  Patient states that she fell asleep in her recliner and accidentally fell forward striking her left side of her face against a piece of furniture.  Patient states current pain score is 4 out of 10.   Past Medical History:  Diagnosis Date  . Diabetes mellitus without complication ([place])   . Hypothyroidism   . RLS (restless legs syndrome)   . Thyroid cancer ([place]) 2007    There are no active problems to display for this patient.   Past Surgical History:  Procedure Laterality Date  . ABDOMINAL HYSTERECTOMY    . COLONOSCOPY WITH PROPOFOL N/A 08/26/2015   Procedure: COLONOSCOPY WITH PROPOFOL;  Surgeon: Manya Silvas, MD;  Location: [facility] ENDOSCOPY;  Service: Endoscopy;  Laterality: N/A;  . THYROIDECTOMY      Prior to Admission medications   Medication Sig Start Date End Date Taking? Authorizing Provider  Biotin 2.5 MG CAPS Take 5,000 capsules by mouth as directed.    [provider]  buPROPion (WELLBUTRIN XL) 150 MG 24 hr tablet Take 150 mg by mouth daily.    [provider]  cholecalciferol (VITAMIN D) 400 units TABS tablet Take 1,000 Units by mouth.    [provider]  levothyroxine (SYNTHROID, LEVOTHROID) 150 MCG tablet Take 150 mcg by mouth daily before breakfast.    [provider]  Liraglutide -Weight Management (SAXENDA) 18 MG/3ML SOPN Inject 3 mg into the skin daily at 6 (six) AM. Reported on 08/26/2015    [provider]  metFORMIN (GLUCOPHAGE) 500 MG tablet Take 250 mg by mouth daily with breakfast.    [provider]  Multiple Vitamin (MULTIVITAMIN) tablet Take 1 tablet by mouth daily.    [provider]  pramipexole (MIRAPEX) 0.25 MG tablet Take 0.25 mg by mouth 2 (two) times daily.    [provider]  senna-docusate (SENOKOT-S) 8.6-50 MG tablet Take 1 tablet by mouth 2 (two) times daily.    [provider]  traMADol (ULTRAM) 50 MG tablet Take by mouth every 6 (six) hours as needed.    [provider]  triamterene-hydrochlorothiazide (MAXZIDE) 75-50 MG tablet Take 1 tablet by mouth daily.    [provider]  valsartan-hydrochlorothiazide (DIOVAN-HCT) 160-12.5 MG tablet Take 1 tablet by mouth daily.    [provider]  zinc gluconate 50 MG tablet Take 50 mg by mouth daily.    [provider]    Allergies No known drug allergies  Family History  Problem Relation Age of Onset  . Breast cancer Neg Hx     Social History Social History   Tobacco Use  . Smoking status: Never Smoker  . Smokeless tobacco: Never Used  Substance Use Topics  . Alcohol use: No  . Drug use: No    Review of Systems Constitutional: No fever/chills Eyes: No visual changes. ENT: No sore throat. Cardiovascular: Denies chest pain. Respiratory: Denies shortness of breath. Gastrointestinal: No abdominal pain.  No nausea, no vomiting.  No diarrhea.  No constipation. Genitourinary: Negative for dysuria. Musculoskeletal: Negative for neck pain.  Negative for back pain.  Integumentary: Negative for rash.  Positive for facial laceration Neurological: Negative for headaches, focal weakness or numbness.   ____________________________________________   PHYSICAL EXAM:  VITAL SIGNS: ED Triage Vitals  Enc Vitals Group     BP 07/08/17 0144 (!) 159/81     Pulse Rate 07/08/17 0143 92     Resp 07/08/17 0143 20     Temp 07/08/17 0143 98.2 F (36.8 C)     Temp Source 07/08/17 0143 Oral     SpO2 07/08/17 0144 95 %     Weight 07/08/17 0143 (!) 138.3 kg  (305 lb)     Height 07/08/17 0143 1.651 m (5\' 5" )     Head Circumference --      Peak Flow --      Pain Score 07/08/17 0143 4     Pain Loc --      Pain Edu? --      Excl. in [place]? --     Constitutional: Alert and oriented. Well appearing and in no acute distress. Eyes: Conjunctivae are normal. PERRL. EOMI. Head: 10 cm linear facial laceration extending from inferior to the left nasal ala to chin Mouth/Throat: Mucous membranes are moist.  Oropharynx non-erythematous. Neck: No stridor.   Cardiovascular: Normal rate, regular rhythm. Good peripheral circulation. Grossly normal heart sounds. Respiratory: Normal respiratory effort.  No retractions. Lungs CTAB. Gastrointestinal: Soft and nontender. No distention.  Musculoskeletal: No lower extremity tenderness nor edema. No gross deformities of extremities. Neurologic:  Normal speech and language. No gross focal neurologic deficits are appreciated.  Skin:  Skin is warm, dry and intact. No rash noted. Psychiatric: Mood and affect are normal. Speech and behavior are normal.  ____________________________  PROCEDURES   .Marland KitchenLaceration Repair Date/Time: 07/08/2017 4:52 AM Performed by: Gregor Hams, MD Authorized by: Gregor Hams, MD   Consent:    Consent obtained:  Verbal   Consent given by:  Patient   Risks discussed:  Pain, poor cosmetic result and infection Anesthesia (see MAR for exact dosages):    Anesthesia method:  Local infiltration   Local anesthetic:  Lidocaine 1% w/o epi Laceration details:    Location:  Face   Face location:  L cheek   Length (cm):  11   Depth (mm):  10 Repair type:    Repair type:  Simple Pre-procedure details:    Preparation:  Patient was prepped and draped in usual sterile fashion Exploration:    Contaminated: no   Treatment:    Area cleansed with:  Betadine and saline   Amount of cleaning:  Standard   Visualized foreign bodies/material removed: no   Skin repair:    Repair method:   Sutures   Suture size:  6-0   Suture material:  Nylon   Suture technique:  Simple interrupted   Number of sutures:  13 Approximation:    Approximation:  Close Post-procedure details:    Dressing:  Antibiotic ointment   Patient tolerance of procedure:  Tolerated well, no immediate complications     ____________________________________________   INITIAL IMPRESSION / ASSESSMENT AND PLAN / ED COURSE  As part of my medical decision making, I reviewed the following data within the electronic MEDICAL RECORD NUMBER2 year old female presented with above-stated history physical exam following accidental fall with left facial laceration.  Patient advised of warning signs that would warrant return to the emergency department.  Patient advised to have sutures removed in 7 days. ____________________________________________  FINAL CLINICAL IMPRESSION(S) / ED DIAGNOSES  Final diagnoses:  Facial  laceration, initial encounter     MEDICATIONS GIVEN DURING THIS VISIT:  Medications  lidocaine (PF) (XYLOCAINE) 1 % injection (not administered)  lidocaine (PF) (XYLOCAINE) 1 % injection (not administered)  neomycin-bacitracin-polymyxin (NEOSPORIN) [id number] ointment (not administered)     ED Discharge Orders    None       Note:  This document was prepared using Dragon voice recognition software and may include unintentional dictation errors.    Gregor Hams, MD 07/08/17 [phone]

## 2018-03-11 ENCOUNTER — Encounter: Payer: BLUE CROSS/BLUE SHIELD | Attending: Neurology | Admitting: Dietician

## 2018-03-11 ENCOUNTER — Encounter: Payer: Self-pay | Admitting: Dietician

## 2018-03-11 VITALS — Ht 65.0 in | Wt 345.9 lb

## 2018-03-11 DIAGNOSIS — Z6841 Body Mass Index (BMI) 40.0 and over, adult: Secondary | ICD-10-CM | POA: Diagnosis not present

## 2018-03-11 NOTE — Patient Instructions (Signed)
   Reduce carb intake by changing snack foods and controlling portions of starchy foods with meals.  Use snack ideas on handout and meal ideas on menu list.   Limit the amount of added fats such as mayonnaise, salad dressing, cream cheese, bacon, margarine, etc.   Eat generous portions of low-carb "free" vegetables and fruits.

## 2018-03-11 NOTE — Progress Notes (Signed)
Medical Nutrition Therapy: Visit start time: 1100  end time: 1230  Assessment:  Diagnosis: obesity Past medical history: Type 2 Diabetes, HTN, sleep apnea, thyroidectomy Psychosocial issues/ stress concerns: none  Preferred learning method:  . Auditory . Visual . Hands-on   Current weight: 345.9lbs Height: 5'5" Medications, supplements: reconciled list in medical record  Progress and evaluation: Patient reports weight gain especially over the past year, many life changes recently. Has participated in Weight Watchers in the past with success, but has had difficulty sticking with the plan recently. Voices interest in keto diet. She was taking Saxenda for weight loss without any change. She went through process of having weight loss surgery several years ago, but insurance coverage was denied about 1-2 weeks before the scheduled surgery.   Physical activity: no structured activity; SOB with walking, mostly due pain in lower back and knees.   Dietary Intake:  Usual eating pattern includes 2-3 meals and 1-3 snacks per day. Dining out frequency: 2 meals per week.  Breakfast: bagel, or eggs and bacon with bread, cereal, cheese toast Snack: sometimes crackers with cheese or peanut butter, other crackers or chips Lunch: sandwich; lean cuisine meal; sometimes skips if lat breakfast, but then eats snack(s) Snack: none or same as am Supper: often salad with steak 2x a week, or chicken grilled occ with bbq sauce, takeout food ie cookout burger and adds salad Snack: none Beverages: water, diet Mt. Dew, usually no regular drinks.   Nutrition Care Education: Topics covered: weight control Basic nutrition: basic food groups, appropriate nutrient balance-- options for healthy macronutrient balance Weight control: benefits of weight control, keto diet-- importance of medical supervision to ensure adequate nutrition and avoid deficiencies; Mediterranean diet as healthy alternative to keto; importance of  low fat and low sugar food choices, portion control, benefits of tracking food intake, plate method for balanced nutrition; role of physical activity; factors such as thyroid function or lack of, stress, pain that can hinder weight loss.   Nutritional Diagnosis:  [place]-3.3 Overweight/obesity As related to excess calories, inactivity, status-post thyroidectomy.  As evidenced by patient with BMI of 57.56 and patient report of dietary history and activity.  Intervention: Instruction as noted above.   Set goals with direction from patient.    Patient will work on today's goals and if no results, will consider keto diet or discuss weight loss medication or weight loss surgery with MD.   Education Materials given:  . Plate Planner with food lists . Sample meal pattern/ menus . Snacking handout . Goals/ instructions   Learner/ who was taught:  . Patient    Level of understanding: [name] Verbalizes/ demonstrates competency   Demonstrated degree of understanding via:   Teach back Learning barriers: . None   Willingness to learn/ readiness for change: . Acceptance, ready for change   Monitoring and Evaluation:  Dietary intake, exercise, and body weight      follow up: 05/06/18

## 2018-05-06 ENCOUNTER — Ambulatory Visit: Payer: BLUE CROSS/BLUE SHIELD | Admitting: Dietician

## 2018-05-16 ENCOUNTER — Encounter: Payer: Self-pay | Admitting: Dietician

## 2018-05-16 NOTE — Progress Notes (Signed)
Patient did not wish to reschedule her follow-up appointment from 05/06/18, which she had cancelled. Sent discharge letter to referring provider.

## 2020-01-27 DIAGNOSIS — N1831 Chronic kidney disease, stage 3a: Secondary | ICD-10-CM | POA: Insufficient documentation

## 2020-01-27 DIAGNOSIS — R7401 Elevation of levels of liver transaminase levels: Secondary | ICD-10-CM | POA: Insufficient documentation

## 2020-02-20 ENCOUNTER — Inpatient Hospital Stay
Admission: EM | Admit: 2020-02-20 | Discharge: 2020-03-01 | DRG: 871 | Disposition: A | Discharge status: Skilled Nursing Facility | Payer: BC Managed Care – PPO | Attending: Internal Medicine | Admitting: Internal Medicine

## 2020-02-20 ENCOUNTER — Emergency Department: Payer: BC Managed Care – PPO

## 2020-02-20 ENCOUNTER — Other Ambulatory Visit: Payer: Self-pay

## 2020-02-20 ENCOUNTER — Encounter: Payer: Self-pay | Admitting: Emergency Medicine

## 2020-02-20 DIAGNOSIS — M545 Low back pain, unspecified: Secondary | ICD-10-CM

## 2020-02-20 DIAGNOSIS — G2581 Restless legs syndrome: Secondary | ICD-10-CM | POA: Diagnosis present

## 2020-02-20 DIAGNOSIS — R4182 Altered mental status, unspecified: Secondary | ICD-10-CM | POA: Diagnosis not present

## 2020-02-20 DIAGNOSIS — R6521 Severe sepsis with septic shock: Secondary | ICD-10-CM | POA: Diagnosis present

## 2020-02-20 DIAGNOSIS — N179 Acute kidney failure, unspecified: Secondary | ICD-10-CM

## 2020-02-20 DIAGNOSIS — N170 Acute kidney failure with tubular necrosis: Secondary | ICD-10-CM | POA: Diagnosis present

## 2020-02-20 DIAGNOSIS — J9601 Acute respiratory failure with hypoxia: Secondary | ICD-10-CM

## 2020-02-20 DIAGNOSIS — R339 Retention of urine, unspecified: Secondary | ICD-10-CM | POA: Diagnosis not present

## 2020-02-20 DIAGNOSIS — N1831 Chronic kidney disease, stage 3a: Secondary | ICD-10-CM

## 2020-02-20 DIAGNOSIS — J9602 Acute respiratory failure with hypercapnia: Secondary | ICD-10-CM | POA: Diagnosis present

## 2020-02-20 DIAGNOSIS — Z79899 Other long term (current) drug therapy: Secondary | ICD-10-CM

## 2020-02-20 DIAGNOSIS — L03116 Cellulitis of left lower limb: Secondary | ICD-10-CM | POA: Diagnosis present

## 2020-02-20 DIAGNOSIS — G92 Toxic encephalopathy: Secondary | ICD-10-CM | POA: Diagnosis present

## 2020-02-20 DIAGNOSIS — J189 Pneumonia, unspecified organism: Secondary | ICD-10-CM | POA: Diagnosis present

## 2020-02-20 DIAGNOSIS — E039 Hypothyroidism, unspecified: Secondary | ICD-10-CM

## 2020-02-20 DIAGNOSIS — G5602 Carpal tunnel syndrome, left upper limb: Secondary | ICD-10-CM | POA: Diagnosis present

## 2020-02-20 DIAGNOSIS — Z79890 Hormone replacement therapy (postmenopausal): Secondary | ICD-10-CM

## 2020-02-20 DIAGNOSIS — I129 Hypertensive chronic kidney disease with stage 1 through stage 4 chronic kidney disease, or unspecified chronic kidney disease: Secondary | ICD-10-CM | POA: Diagnosis present

## 2020-02-20 DIAGNOSIS — R579 Shock, unspecified: Secondary | ICD-10-CM

## 2020-02-20 DIAGNOSIS — E890 Postprocedural hypothyroidism: Secondary | ICD-10-CM | POA: Diagnosis present

## 2020-02-20 DIAGNOSIS — G9341 Metabolic encephalopathy: Secondary | ICD-10-CM

## 2020-02-20 DIAGNOSIS — A419 Sepsis, unspecified organism: Principal | ICD-10-CM | POA: Diagnosis present

## 2020-02-20 DIAGNOSIS — G4733 Obstructive sleep apnea (adult) (pediatric): Secondary | ICD-10-CM | POA: Diagnosis present

## 2020-02-20 DIAGNOSIS — E1142 Type 2 diabetes mellitus with diabetic polyneuropathy: Secondary | ICD-10-CM | POA: Diagnosis present

## 2020-02-20 DIAGNOSIS — M479 Spondylosis, unspecified: Secondary | ICD-10-CM | POA: Diagnosis present

## 2020-02-20 DIAGNOSIS — M48061 Spinal stenosis, lumbar region without neurogenic claudication: Secondary | ICD-10-CM | POA: Diagnosis present

## 2020-02-20 DIAGNOSIS — D631 Anemia in chronic kidney disease: Secondary | ICD-10-CM | POA: Diagnosis present

## 2020-02-20 DIAGNOSIS — Z20822 Contact with and (suspected) exposure to covid-19: Secondary | ICD-10-CM | POA: Diagnosis present

## 2020-02-20 DIAGNOSIS — E1122 Type 2 diabetes mellitus with diabetic chronic kidney disease: Secondary | ICD-10-CM | POA: Diagnosis present

## 2020-02-20 DIAGNOSIS — M5416 Radiculopathy, lumbar region: Secondary | ICD-10-CM | POA: Diagnosis present

## 2020-02-20 DIAGNOSIS — L03115 Cellulitis of right lower limb: Secondary | ICD-10-CM

## 2020-02-20 DIAGNOSIS — E662 Morbid (severe) obesity with alveolar hypoventilation: Secondary | ICD-10-CM | POA: Diagnosis present

## 2020-02-20 DIAGNOSIS — N1832 Chronic kidney disease, stage 3b: Secondary | ICD-10-CM | POA: Diagnosis present

## 2020-02-20 DIAGNOSIS — Z85850 Personal history of malignant neoplasm of thyroid: Secondary | ICD-10-CM

## 2020-02-20 DIAGNOSIS — Z6841 Body Mass Index (BMI) 40.0 and over, adult: Secondary | ICD-10-CM

## 2020-02-20 DIAGNOSIS — E1140 Type 2 diabetes mellitus with diabetic neuropathy, unspecified: Secondary | ICD-10-CM

## 2020-02-20 DIAGNOSIS — Z7984 Long term (current) use of oral hypoglycemic drugs: Secondary | ICD-10-CM

## 2020-02-20 LAB — CBC WITH DIFFERENTIAL/PLATELET
Abs Immature Granulocytes: 0.1 10*3/uL — ABNORMAL HIGH (ref 0.00–0.07)
Basophils Absolute: 0.1 10*3/uL (ref 0.0–0.1)
Basophils Relative: 0 %
Eosinophils Absolute: 0.2 10*3/uL (ref 0.0–0.5)
Eosinophils Relative: 1 %
HCT: 36.6 % (ref 36.0–46.0)
Hemoglobin: 11 g/dL — ABNORMAL LOW (ref 12.0–15.0)
Immature Granulocytes: 1 %
Lymphocytes Relative: 6 %
Lymphs Abs: 1.2 10*3/uL (ref 0.7–4.0)
MCH: 27.5 pg (ref 26.0–34.0)
MCHC: 30.1 g/dL (ref 30.0–36.0)
MCV: 91.5 fL (ref 80.0–100.0)
Monocytes Absolute: 1.5 10*3/uL — ABNORMAL HIGH (ref 0.1–1.0)
Monocytes Relative: 8 %
Neutro Abs: 16.3 10*3/uL — ABNORMAL HIGH (ref 1.7–7.7)
Neutrophils Relative %: 84 %
Platelets: 407 10*3/uL — ABNORMAL HIGH (ref 150–400)
RBC: 4 MIL/uL (ref 3.87–5.11)
RDW: 17.8 % — ABNORMAL HIGH (ref 11.5–15.5)
WBC: 19.3 10*3/uL — ABNORMAL HIGH (ref 4.0–10.5)
nRBC: 0 % (ref 0.0–0.2)

## 2020-02-20 LAB — COMPREHENSIVE METABOLIC PANEL
ALT: 26 U/L (ref 0–44)
AST: 47 U/L — ABNORMAL HIGH (ref 15–41)
Albumin: 2.6 g/dL — ABNORMAL LOW (ref 3.5–5.0)
Alkaline Phosphatase: 119 U/L (ref 38–126)
Anion gap: 13 (ref 5–15)
BUN: 48 mg/dL — ABNORMAL HIGH (ref 8–23)
CO2: 29 mmol/L (ref 22–32)
Calcium: 8.9 mg/dL (ref 8.9–10.3)
Chloride: 95 mmol/L — ABNORMAL LOW (ref 98–111)
Creatinine, Ser: 1.57 mg/dL — ABNORMAL HIGH (ref 0.44–1.00)
GFR calc Af Amer: 40 mL/min — ABNORMAL LOW (ref >60)
GFR calc non Af Amer: 34 mL/min — ABNORMAL LOW (ref >60)
Glucose, Bld: 115 mg/dL — ABNORMAL HIGH (ref 70–99)
Potassium: 4.5 mmol/L (ref 3.5–5.1)
Sodium: 137 mmol/L (ref 135–145)
Total Bilirubin: 1.8 mg/dL — ABNORMAL HIGH (ref 0.3–1.2)
Total Protein: 7.8 g/dL (ref 6.5–8.1)

## 2020-02-20 LAB — LACTIC ACID, PLASMA: Lactic Acid, Venous: 1.3 mmol/L (ref 0.5–1.9)

## 2020-02-20 LAB — BRAIN NATRIURETIC PEPTIDE: B Natriuretic Peptide: 180.5 pg/mL — ABNORMAL HIGH (ref 0.0–100.0)

## 2020-02-20 MED ORDER — ONDANSETRON HCL 4 MG/2ML IJ SOLN
4.0000 mg | Freq: Once | INTRAMUSCULAR | Status: AC
  Administered 2020-02-20: 4 mg via INTRAVENOUS
  Filled 2020-02-20: qty 2

## 2020-02-20 MED ORDER — KETOROLAC TROMETHAMINE 30 MG/ML IJ SOLN
15.0000 mg | Freq: Once | INTRAMUSCULAR | Status: AC
  Administered 2020-02-20: 15 mg via INTRAVENOUS
  Filled 2020-02-20: qty 1

## 2020-02-20 MED ORDER — MELOXICAM 7.5 MG PO TABS: 7.5000 mg | ORAL_TABLET | Freq: Every day | ORAL | 0 refills | Status: DC

## 2020-02-20 MED ORDER — CLINDAMYCIN HCL 300 MG PO CAPS: 300.0000 mg | ORAL_CAPSULE | Freq: Four times a day (QID) | ORAL | 0 refills | Status: DC

## 2020-02-20 MED ORDER — GABAPENTIN 300 MG PO CAPS
300.0000 mg | ORAL_CAPSULE | Freq: Once | ORAL | Status: AC
  Administered 2020-02-20: 300 mg via ORAL
  Filled 2020-02-20: qty 1

## 2020-02-20 MED ORDER — MORPHINE SULFATE (PF) 4 MG/ML IV SOLN
4.0000 mg | Freq: Once | INTRAVENOUS | Status: AC
  Administered 2020-02-20: 4 mg via INTRAVENOUS
  Filled 2020-02-20: qty 1

## 2020-02-20 MED ORDER — CEFAZOLIN SODIUM-DEXTROSE 1-4 GM/50ML-% IV SOLN
1.0000 g | Freq: Once | INTRAVENOUS | Status: AC
  Administered 2020-02-20: 1 g via INTRAVENOUS
  Filled 2020-02-20: qty 50

## 2020-02-20 NOTE — ED Notes (Signed)
Pt reports coming in due to neuropathy pain, causing her to have difficulty walking due to the pain.

## 2020-02-20 NOTE — ED Provider Notes (Signed)
[facility] Emergency Department Provider Note  ____________________________________________  Time seen: Approximately 7:52 PM  I have reviewed the triage vital signs and the nursing notes.   HISTORY  Chief Complaint Foot Pain    HPI Madison Mcbride is a 64 y.o. female who presents the emergency department complaining of bilateral foot pain.  Patient has been having increased bilateral lower extremity pain.  She states that this is been ongoing for some time though it is definitely worsened in the past week to 2 weeks.  Patient states that even light touch to the skin is very painful to her.  She does have a history of diabetes but has never been diagnosed with peripheral neuropathy.  She denies any back pain, bowel or bladder dysfunction, saddle anesthesia or paresthesias.  Patient states that the pain is severe enough that she does not feel like she can ambulate at this time.  Patient is also having pain and some swelling extending into the right calf.  She does have some skin changes bilateral lower extremities around the calf but the pain is predominantly in the right calf and not the left.  No loss of sensation.  No medications for his complaint prior to arrival.         Past Medical History:  Diagnosis Date  . Diabetes mellitus without complication ([place])   . Hypothyroidism   . RLS (restless legs syndrome)   . Thyroid cancer ([place]) 2007    There are no problems to display for this patient.   Past Surgical History:  Procedure Laterality Date  . ABDOMINAL HYSTERECTOMY    . COLONOSCOPY WITH PROPOFOL N/A 08/26/2015   Procedure: COLONOSCOPY WITH PROPOFOL;  Surgeon: Manya Silvas, MD;  Location: [facility] ENDOSCOPY;  Service: Endoscopy;  Laterality: N/A;  . THYROIDECTOMY      Prior to Admission medications   Medication Sig Start Date End Date Taking? Authorizing Provider  Biotin 2.5 MG CAPS Take 5,000 capsules by mouth as directed.    [provider]  buPROPion (WELLBUTRIN XL) 150 MG 24 hr tablet Take 150 mg by mouth daily.    [provider]  DULoxetine (CYMBALTA) 60 MG capsule Take by mouth daily. 02/01/18   [provider]  levofloxacin (LEVAQUIN) 750 MG tablet levofloxacin 750 mg tablet  TAKE 1 TABLET BY MOUTH EVERY DAY    [provider]  metFORMIN (GLUCOPHAGE) 500 MG tablet Take 250 mg by mouth daily with breakfast.    [provider]  metolazone (ZAROXOLYN) 2.5 MG tablet Take 2.5 mg by mouth daily. 01/31/18   [provider]  metolazone (ZAROXOLYN) 5 MG tablet Take 5 mg by mouth daily. 01/20/18   [provider]  Multiple Vitamin (MULTIVITAMIN) tablet Take 1 tablet by mouth daily.    [provider]  mupirocin cream (BACTROBAN) 2 % APPLY TO AFFECTED AREA TWICE A DAY 11/09/17   [provider]  rOPINIRole (REQUIP) 3 MG tablet Take 3 mg by mouth 3 (three) times daily. 01/20/18   [provider]  senna-docusate (SENOKOT-S) 8.6-50 MG tablet Take 1 tablet by mouth 2 (two) times daily.    [provider]  SYNTHROID 200 MCG tablet Take 200 mcg by mouth every morning. 01/29/18   [provider]  traMADol (ULTRAM) 50 MG tablet Take by mouth every 6 (six) hours as needed.    [provider]  Triamterene (DYRENIUM PO) triamterene    [provider]  valsartan-hydrochlorothiazide (DIOVAN-HCT) 160-12.5 MG tablet Take  1 tablet by mouth daily.    [provider]  zinc gluconate 50 MG tablet Take 50 mg by mouth daily.    [provider]    Allergies Patient has no known allergies.  Family History  Problem Relation Age of Onset  . Breast cancer Neg Hx     Social History Social History   Tobacco Use  . Smoking status: Never Smoker  . Smokeless tobacco: Never Used  Substance Use Topics  . Alcohol use: No  . Drug use: No     Review of Systems  Constitutional: No fever/chills Eyes: No visual  changes. No discharge ENT: No upper respiratory complaints. Cardiovascular: no chest pain. Respiratory: no cough. No SOB. Gastrointestinal: No abdominal pain.  No nausea, no vomiting.  No diarrhea.  No constipation. Genitourinary: Negative for dysuria. No hematuria Musculoskeletal: Bilateral foot pain with pain extending into the right calf.  Skin changes. Skin: Negative for rash, abrasions, lacerations, ecchymosis. Neurological: Negative for headaches, focal weakness or numbness. 10-point ROS otherwise negative.  ____________________________________________   PHYSICAL EXAM:  VITAL SIGNS: ED Triage Vitals  Enc Vitals Group     BP 02/20/20 1916 (!) 103/52     Pulse Rate 02/20/20 1916 74     Resp 02/20/20 1916 18     Temp 02/20/20 1916 99.2 F (37.3 C)     Temp Source 02/20/20 1916 Oral     SpO2 02/20/20 1916 92 %     Weight 02/20/20 1914 (!) 320 lb (145.2 kg)     Height 02/20/20 1914 5\' 5"  (1.651 m)     Head Circumference --      Peak Flow --      Pain Score 02/20/20 1914 8     Pain Loc --      Pain Edu? --      Excl. in [place]? --      Constitutional: Alert and oriented. Well appearing and in no acute distress. Eyes: Conjunctivae are normal. PERRL. EOMI. Head: Atraumatic. ENT:      Ears:       Nose: No congestion/rhinnorhea.      Mouth/Throat: Mucous membranes are moist.  Neck: No stridor.    Cardiovascular: Normal rate, regular rhythm. Normal S1 and S2.  Good peripheral circulation. Respiratory: Normal respiratory effort without tachypnea or retractions. Lungs CTAB. Good air entry to the bases with no decreased or absent breath sounds. Gastrointestinal: Bowel sounds 4 quadrants. Soft and nontender to palpation. No guarding or rigidity. No palpable masses. No distention. No CVA tenderness. Musculoskeletal: Full range of motion to all extremities. No gross deformities appreciated.  Visualization of bilateral lower extremities reveals edema which appears chronic  bilaterally.  There are some skin changes noted to bilateral calf regions consistent with venous stasis versus cellulitis.  Bilateral lower calfs are very warm to palpation.  Sensation intact to the left lower extremity with no decreased sensation.  Good range of motion to the ankle in all digits.  Dorsalis pedis pulse and capillary refill intact all digits.  Patient is decreased sensation when compared with left to the right foot.  This does not extend into the calf.  Patient does have good range of motion.  Dorsalis pedis pulse and capillary refill intact all digits. Neurologic:  Normal speech and language. No gross focal neurologic deficits are appreciated.  Skin:  Skin is warm, dry and intact. No rash noted. Psychiatric: Mood and affect are normal. Speech and behavior are normal. Patient exhibits appropriate insight and judgement.  ____________________________________________   LABS (all labs ordered are listed, but only abnormal results are displayed)  Labs Reviewed  COMPREHENSIVE METABOLIC PANEL - Abnormal; Notable for the following components:      Result Value   Chloride 95 (*)    Glucose, Bld 115 (*)    BUN 48 (*)    Creatinine, Ser 1.57 (*)    Albumin 2.6 (*)    AST 47 (*)    Total Bilirubin 1.8 (*)    GFR calc non Af Amer 34 (*)    GFR calc Af Amer 40 (*)    All other components within normal limits  CBC WITH DIFFERENTIAL/PLATELET - Abnormal; Notable for the following components:   WBC 19.3 (*)    Hemoglobin 11.0 (*)    RDW 17.8 (*)    Platelets 407 (*)    Neutro Abs 16.3 (*)    Monocytes Absolute 1.5 (*)    Abs Immature Granulocytes 0.10 (*)    All other components within normal limits  BRAIN NATRIURETIC PEPTIDE - Abnormal; Notable for the following components:   B Natriuretic Peptide 180.5 (*)    All other components within normal limits  LACTIC ACID, PLASMA  LACTIC ACID, PLASMA    ____________________________________________  EKG   ____________________________________________  RADIOLOGY I personally viewed and evaluated these images as part of my medical decision making, as well as reviewing the written report by the radiologist.  US Venous Img Lower Bilateral  Result Date: 02/20/2020 EXAM: BILATERAL LOWER EXTREMITY VENOUS DOPPLER ULTRASOUND TECHNIQUE: Gray-scale sonography with graded compression, as well as color Doppler and duplex ultrasound were performed to evaluate the lower extremity deep venous systems from the level of the common femoral vein and including the common femoral, femoral, profunda femoral, popliteal and calf veins including the posterior tibial, peroneal and gastrocnemius veins when visible. The superficial great saphenous vein was also interrogated. Spectral Doppler was utilized to evaluate flow at rest and with distal augmentation maneuvers in the common femoral, femoral and popliteal veins. COMPARISON:  None. FINDINGS: RIGHT LOWER EXTREMITY Common Femoral Vein: No evidence of thrombus. Normal compressibility, respiratory phasicity and response to augmentation. Saphenofemoral Junction: No evidence of thrombus. Normal compressibility and flow on color Doppler imaging. Profunda Femoral Vein: No evidence of thrombus. Normal compressibility and flow on color Doppler imaging. Femoral Vein: No evidence of thrombus. Normal compressibility, respiratory phasicity and response to augmentation. Popliteal Vein: No evidence of thrombus. Normal compressibility, respiratory phasicity and response to augmentation. Calf Veins: No evidence of thrombus. Normal compressibility and flow on color Doppler imaging. Peroneal vein not visualized. Superficial Great Saphenous Vein: No evidence of thrombus. Normal compressibility. Venous Reflux:  None. Other Findings: Mildly complex hypoechoic collection at the right popliteal fossa of measures 7.2 x 1.6 x 2.4 cm, consistent with a  Baker cyst. Mild diffuse soft tissue/interstitial edema noted within the soft tissues of the lower leg/calf. LEFT LOWER EXTREMITY Common Femoral Vein: No evidence of thrombus. Normal compressibility, respiratory phasicity and response to augmentation. Saphenofemoral Junction: No evidence of thrombus. Normal compressibility and flow on color Doppler imaging. Profunda Femoral Vein: No evidence of thrombus. Normal compressibility and flow on color Doppler imaging. Femoral Vein: No evidence of thrombus. Normal compressibility, respiratory phasicity and response to augmentation. Popliteal Vein: No evidence of thrombus. Normal compressibility, respiratory phasicity and response to augmentation. Calf Veins: No evidence of thrombus. Normal compressibility and flow on color Doppler imaging. Peroneal vein not visualized. Superficial Great Saphenous Vein: No evidence of thrombus. Normal compressibility. Venous Reflux:  None. Other Findings: Hypoechoic collection at the left popliteal fossa measures 1.7 x 0.6 x 1.8 cm, suggesting a small Baker cyst. Mild diffuse soft tissue/interstitial edema noted within the soft tissues of the lower leg/calf. IMPRESSION: 1. No evidence of deep venous thrombosis in either lower extremity. 2. Mild diffuse soft tissue/interstitial edema within the lower legs/calves bilaterally. 3. Bilateral Baker's cysts as detailed above, right larger than left. Electronically Signed   By: Jeannine Boga M.D.   On: 02/20/2020 21:23    ____________________________________________    PROCEDURES  Procedure(s) performed:    Procedures    Medications  morphine 4 MG/ML injection 4 mg (has no administration in time range)  ceFAZolin (ANCEF) IVPB 1 g/50 mL premix (has no administration in time range)  ketorolac (TORADOL) 30 MG/ML injection 15 mg (has no administration in time range)  gabapentin (NEURONTIN) capsule 300 mg (has no administration in time range)  ondansetron (ZOFRAN) injection 4  mg (has no administration in time range)     ____________________________________________   INITIAL IMPRESSION / ASSESSMENT AND PLAN / ED COURSE  Pertinent labs & imaging results that were available during my care of the patient were reviewed by me and considered in my medical decision making (see chart for details).  Review of the [place] CSRS was performed in accordance of the [place] prior to dispensing any controlled drugs.           Patient's diagnosis is consistent with cellulitis of the lower extremities, peripheral neuropathy.  Patient presented to the emergency department with gradually worsening pain to the bilateral lower extremities.  Over the past several days patient has noticed some redness, swelling to the lower extremities as well.  On exam, findings were concerning for DVT versus cellulitis versus venous stasis dermatitis.  Ultrasound reveals no DVT.  Labs reveal an elevated white blood cell count and given the warmth, erythema, progressive nature I will treat the patient for cellulitis.  The patient's lactic acid was reassuring.  No evidence of sepsis.  I do feel that this symptom improvement is a combination of her peripheral neuropathy and cellulitis.  [name] had presented to the emergency department when I went to inform patient results.  Evidently patient does have a history of diabetic peripheral neuropathy and is on gabapentin for same.  They have decreased her dose recently given the state of drowsiness that patient experience when taking increased dosing of the gabapentin.  At this time I do feel with the increase symptoms that she will likely need to increase her gabapentin.  Return precautions are discussed with the patient and her husband.  Patient will be given Ancef, very low-dose of Toradol, morphine for symptom improvement.. Patient will be discharged home with prescriptions for clindamycin with instructions to use a probiotic.  Patient will have low-dose  meloxicam in addition to increasing her gabapentin..  Patient is given ED precautions to return to the ED for any worsening or new symptoms.     ____________________________________________  FINAL CLINICAL IMPRESSION(S) / ED DIAGNOSES  Final diagnoses:  Cellulitis of left lower extremity  Cellulitis of right lower extremity  Diabetic polyneuropathy associated with type 2 diabetes mellitus ([place])      NEW MEDICATIONS STARTED DURING THIS VISIT:  ED Discharge Orders    None          This chart was dictated using voice recognition software/Dragon. Despite best efforts to proofread, errors can occur which can change the meaning. Any change was purely unintentional.  Brynda Peon 02/20/20 2324    Nance Pear, MD 02/21/20 1606

## 2020-02-20 NOTE — ED Triage Notes (Signed)
Pt to triage via recliner for pain in feet & ankles for several wks with no known injury, esp with any wt bearing; pt denies any other c/o at this time

## 2020-02-21 ENCOUNTER — Emergency Department: Payer: BC Managed Care – PPO

## 2020-02-21 ENCOUNTER — Inpatient Hospital Stay
Admit: 2020-02-21 | Discharge: 2020-02-21 | Disposition: A | Discharge status: Home / Self Care | Payer: BC Managed Care – PPO | Attending: Internal Medicine | Admitting: Internal Medicine

## 2020-02-21 ENCOUNTER — Inpatient Hospital Stay: Payer: BC Managed Care – PPO

## 2020-02-21 DIAGNOSIS — J9602 Acute respiratory failure with hypercapnia: Secondary | ICD-10-CM | POA: Diagnosis present

## 2020-02-21 DIAGNOSIS — L03116 Cellulitis of left lower limb: Secondary | ICD-10-CM | POA: Diagnosis present

## 2020-02-21 DIAGNOSIS — G9341 Metabolic encephalopathy: Secondary | ICD-10-CM | POA: Diagnosis not present

## 2020-02-21 DIAGNOSIS — I129 Hypertensive chronic kidney disease with stage 1 through stage 4 chronic kidney disease, or unspecified chronic kidney disease: Secondary | ICD-10-CM | POA: Diagnosis present

## 2020-02-21 DIAGNOSIS — N1832 Chronic kidney disease, stage 3b: Secondary | ICD-10-CM | POA: Diagnosis present

## 2020-02-21 DIAGNOSIS — E039 Hypothyroidism, unspecified: Secondary | ICD-10-CM

## 2020-02-21 DIAGNOSIS — E890 Postprocedural hypothyroidism: Secondary | ICD-10-CM | POA: Diagnosis present

## 2020-02-21 DIAGNOSIS — J9601 Acute respiratory failure with hypoxia: Secondary | ICD-10-CM

## 2020-02-21 DIAGNOSIS — A419 Sepsis, unspecified organism: Secondary | ICD-10-CM | POA: Diagnosis present

## 2020-02-21 DIAGNOSIS — N170 Acute kidney failure with tubular necrosis: Secondary | ICD-10-CM | POA: Diagnosis present

## 2020-02-21 DIAGNOSIS — M5416 Radiculopathy, lumbar region: Secondary | ICD-10-CM | POA: Diagnosis present

## 2020-02-21 DIAGNOSIS — N179 Acute kidney failure, unspecified: Secondary | ICD-10-CM

## 2020-02-21 DIAGNOSIS — Z6841 Body Mass Index (BMI) 40.0 and over, adult: Secondary | ICD-10-CM | POA: Diagnosis not present

## 2020-02-21 DIAGNOSIS — R579 Shock, unspecified: Secondary | ICD-10-CM

## 2020-02-21 DIAGNOSIS — L03115 Cellulitis of right lower limb: Secondary | ICD-10-CM | POA: Diagnosis present

## 2020-02-21 DIAGNOSIS — G2581 Restless legs syndrome: Secondary | ICD-10-CM | POA: Diagnosis present

## 2020-02-21 DIAGNOSIS — N1831 Chronic kidney disease, stage 3a: Secondary | ICD-10-CM

## 2020-02-21 DIAGNOSIS — R6521 Severe sepsis with septic shock: Secondary | ICD-10-CM | POA: Diagnosis present

## 2020-02-21 DIAGNOSIS — M479 Spondylosis, unspecified: Secondary | ICD-10-CM | POA: Diagnosis present

## 2020-02-21 DIAGNOSIS — R6520 Severe sepsis without septic shock: Secondary | ICD-10-CM | POA: Diagnosis not present

## 2020-02-21 DIAGNOSIS — E1122 Type 2 diabetes mellitus with diabetic chronic kidney disease: Secondary | ICD-10-CM | POA: Diagnosis present

## 2020-02-21 DIAGNOSIS — J189 Pneumonia, unspecified organism: Secondary | ICD-10-CM

## 2020-02-21 DIAGNOSIS — E1142 Type 2 diabetes mellitus with diabetic polyneuropathy: Secondary | ICD-10-CM | POA: Diagnosis present

## 2020-02-21 DIAGNOSIS — G4733 Obstructive sleep apnea (adult) (pediatric): Secondary | ICD-10-CM | POA: Diagnosis present

## 2020-02-21 DIAGNOSIS — E1140 Type 2 diabetes mellitus with diabetic neuropathy, unspecified: Secondary | ICD-10-CM

## 2020-02-21 DIAGNOSIS — D631 Anemia in chronic kidney disease: Secondary | ICD-10-CM | POA: Diagnosis present

## 2020-02-21 DIAGNOSIS — R339 Retention of urine, unspecified: Secondary | ICD-10-CM | POA: Diagnosis not present

## 2020-02-21 DIAGNOSIS — M545 Low back pain: Secondary | ICD-10-CM | POA: Diagnosis not present

## 2020-02-21 DIAGNOSIS — R4182 Altered mental status, unspecified: Secondary | ICD-10-CM | POA: Diagnosis present

## 2020-02-21 DIAGNOSIS — Z20822 Contact with and (suspected) exposure to covid-19: Secondary | ICD-10-CM | POA: Diagnosis present

## 2020-02-21 DIAGNOSIS — G92 Toxic encephalopathy: Secondary | ICD-10-CM | POA: Diagnosis present

## 2020-02-21 DIAGNOSIS — E662 Morbid (severe) obesity with alveolar hypoventilation: Secondary | ICD-10-CM | POA: Diagnosis present

## 2020-02-21 DIAGNOSIS — M48061 Spinal stenosis, lumbar region without neurogenic claudication: Secondary | ICD-10-CM | POA: Diagnosis present

## 2020-02-21 LAB — GLUCOSE, CAPILLARY
Glucose-Capillary: 115 mg/dL — ABNORMAL HIGH (ref 70–99)
Glucose-Capillary: 134 mg/dL — ABNORMAL HIGH (ref 70–99)
Glucose-Capillary: 137 mg/dL — ABNORMAL HIGH (ref 70–99)
Glucose-Capillary: 165 mg/dL — ABNORMAL HIGH (ref 70–99)
Glucose-Capillary: 177 mg/dL — ABNORMAL HIGH (ref 70–99)

## 2020-02-21 LAB — BLOOD GAS, ARTERIAL
Acid-Base Excess: 2.9 mmol/L — ABNORMAL HIGH (ref 0.0–2.0)
Bicarbonate: 29.9 mmol/L — ABNORMAL HIGH (ref 20.0–28.0)
FIO2: 0.32
O2 Saturation: 95 %
Patient temperature: 37
pCO2 arterial: 58 mmHg — ABNORMAL HIGH (ref 32.0–48.0)
pH, Arterial: 7.32 — ABNORMAL LOW (ref 7.350–7.450)
pO2, Arterial: 82 mmHg — ABNORMAL LOW (ref 83.0–108.0)

## 2020-02-21 LAB — CBC
HCT: 31.7 % — ABNORMAL LOW (ref 36.0–46.0)
Hemoglobin: 9.1 g/dL — ABNORMAL LOW (ref 12.0–15.0)
MCH: 27.5 pg (ref 26.0–34.0)
MCHC: 28.7 g/dL — ABNORMAL LOW (ref 30.0–36.0)
MCV: 95.8 fL (ref 80.0–100.0)
Platelets: 397 10*3/uL (ref 150–400)
RBC: 3.31 MIL/uL — ABNORMAL LOW (ref 3.87–5.11)
RDW: 17.9 % — ABNORMAL HIGH (ref 11.5–15.5)
WBC: 20.8 10*3/uL — ABNORMAL HIGH (ref 4.0–10.5)
nRBC: 0 % (ref 0.0–0.2)

## 2020-02-21 LAB — URINALYSIS, COMPLETE (UACMP) WITH MICROSCOPIC
Bacteria, UA: NONE SEEN
Bilirubin Urine: NEGATIVE
Glucose, UA: NEGATIVE mg/dL
Hgb urine dipstick: NEGATIVE
Ketones, ur: NEGATIVE mg/dL
Leukocytes,Ua: NEGATIVE
Nitrite: NEGATIVE
Protein, ur: NEGATIVE mg/dL
Specific Gravity, Urine: 1.01 (ref 1.005–1.030)
pH: 6 (ref 5.0–8.0)

## 2020-02-21 LAB — CREATININE, SERUM
Creatinine, Ser: 2.33 mg/dL — ABNORMAL HIGH (ref 0.44–1.00)
GFR calc Af Amer: 25 mL/min — ABNORMAL LOW (ref >60)
GFR calc non Af Amer: 21 mL/min — ABNORMAL LOW (ref >60)

## 2020-02-21 LAB — LACTIC ACID, PLASMA
Lactic Acid, Venous: 1.5 mmol/L (ref 0.5–1.9)
Lactic Acid, Venous: 1.4 mmol/L (ref 0.5–1.9)

## 2020-02-21 LAB — HIV ANTIBODY (ROUTINE TESTING W REFLEX): HIV Screen 4th Generation wRfx: NONREACTIVE

## 2020-02-21 LAB — MRSA PCR SCREENING: MRSA by PCR: NEGATIVE

## 2020-02-21 LAB — SARS CORONAVIRUS 2 BY RT PCR (HOSPITAL ORDER, PERFORMED IN ~~LOC~~ HOSPITAL LAB): SARS Coronavirus 2: NEGATIVE

## 2020-02-21 LAB — T4, FREE: Free T4: 1.31 ng/dL — ABNORMAL HIGH (ref 0.61–1.12)

## 2020-02-21 LAB — PROCALCITONIN: Procalcitonin: 2.09 ng/mL

## 2020-02-21 LAB — AMMONIA: Ammonia: 32 umol/L (ref 9–35)

## 2020-02-21 MED ORDER — NOREPINEPHRINE 4 MG/250ML-% IV SOLN
0.0000 ug/min | INTRAVENOUS | Status: DC
  Administered 2020-02-21 (×2): 6 ug/min via INTRAVENOUS
  Filled 2020-02-21: qty 250

## 2020-02-21 MED ORDER — NOREPINEPHRINE 4 MG/250ML-% IV SOLN
2.0000 ug/min | INTRAVENOUS | Status: DC
  Administered 2020-02-21: 2 ug/min via INTRAVENOUS
  Filled 2020-02-21: qty 250

## 2020-02-21 MED ORDER — ACETAMINOPHEN 325 MG PO TABS
650.0000 mg | ORAL_TABLET | Freq: Four times a day (QID) | ORAL | Status: DC | PRN
  Administered 2020-02-22 – 2020-02-29 (×12): 650 mg via ORAL
  Filled 2020-02-21 (×12): qty 2

## 2020-02-21 MED ORDER — ONDANSETRON HCL 4 MG PO TABS: 4.0000 mg | ORAL_TABLET | Freq: Four times a day (QID) | ORAL | Status: DC | PRN

## 2020-02-21 MED ORDER — SODIUM CHLORIDE 0.9 % IV SOLN: INTRAVENOUS | Status: DC

## 2020-02-21 MED ORDER — ACETAMINOPHEN 650 MG RE SUPP: 650.0000 mg | Freq: Four times a day (QID) | RECTAL | Status: DC | PRN

## 2020-02-21 MED ORDER — SODIUM CHLORIDE 0.9 % IV SOLN
250.0000 mL | INTRAVENOUS | Status: DC
  Administered 2020-02-21: 250 mL via INTRAVENOUS

## 2020-02-21 MED ORDER — LACTATED RINGERS IV BOLUS (SEPSIS)
1000.0000 mL | Freq: Once | INTRAVENOUS | Status: AC
  Administered 2020-02-21: 1000 mL via INTRAVENOUS

## 2020-02-21 MED ORDER — INSULIN ASPART 100 UNIT/ML ~~LOC~~ SOLN
0.0000 [IU] | Freq: Every day | SUBCUTANEOUS | Status: DC
  Administered 2020-02-22: 4 [IU] via SUBCUTANEOUS
  Filled 2020-02-21 (×2): qty 1

## 2020-02-21 MED ORDER — LACTATED RINGERS IV BOLUS (SEPSIS): 1000.0000 mL | Freq: Once | INTRAVENOUS | Status: DC

## 2020-02-21 MED ORDER — LACTATED RINGERS IV BOLUS (SEPSIS): 500.0000 mL | Freq: Once | INTRAVENOUS | Status: DC

## 2020-02-21 MED ORDER — VANCOMYCIN HCL 2000 MG/400ML IV SOLN
2000.0000 mg | Freq: Once | INTRAVENOUS | Status: AC
  Administered 2020-02-21: 2000 mg via INTRAVENOUS
  Filled 2020-02-21 (×2): qty 400

## 2020-02-21 MED ORDER — BUDESONIDE 0.5 MG/2ML IN SUSP
0.5000 mg | Freq: Two times a day (BID) | RESPIRATORY_TRACT | Status: DC
  Administered 2020-02-21 – 2020-03-01 (×17): 0.5 mg via RESPIRATORY_TRACT
  Filled 2020-02-21 (×18): qty 2

## 2020-02-21 MED ORDER — DICLOFENAC SODIUM 1 % EX GEL
2.0000 g | Freq: Four times a day (QID) | CUTANEOUS | Status: DC
  Administered 2020-02-22 – 2020-03-01 (×30): 2 g via TOPICAL
  Filled 2020-02-21 (×2): qty 100

## 2020-02-21 MED ORDER — SODIUM CHLORIDE 0.9 % IV SOLN
2.0000 g | INTRAVENOUS | Status: DC
  Administered 2020-02-21: 2 g via INTRAVENOUS
  Filled 2020-02-21: qty 20

## 2020-02-21 MED ORDER — INSULIN ASPART 100 UNIT/ML ~~LOC~~ SOLN
0.0000 [IU] | Freq: Three times a day (TID) | SUBCUTANEOUS | Status: DC
  Administered 2020-02-21: 4 [IU] via SUBCUTANEOUS
  Administered 2020-02-21 – 2020-02-22 (×2): 3 [IU] via SUBCUTANEOUS
  Administered 2020-02-22 (×2): 4 [IU] via SUBCUTANEOUS
  Administered 2020-02-23 (×2): 3 [IU] via SUBCUTANEOUS
  Administered 2020-02-26 – 2020-02-29 (×3): 4 [IU] via SUBCUTANEOUS
  Filled 2020-02-21 (×10): qty 1

## 2020-02-21 MED ORDER — SODIUM CHLORIDE 0.9 % IV BOLUS
500.0000 mL | Freq: Once | INTRAVENOUS | Status: AC
  Administered 2020-02-21: 500 mL via INTRAVENOUS

## 2020-02-21 MED ORDER — IPRATROPIUM-ALBUTEROL 0.5-2.5 (3) MG/3ML IN SOLN
3.0000 mL | Freq: Four times a day (QID) | RESPIRATORY_TRACT | Status: DC
  Filled 2020-02-21 (×2): qty 3

## 2020-02-21 MED ORDER — SODIUM CHLORIDE 0.9 % IV BOLUS
1000.0000 mL | Freq: Once | INTRAVENOUS | Status: AC
  Administered 2020-02-21: 1000 mL via INTRAVENOUS

## 2020-02-21 MED ORDER — LEVOTHYROXINE SODIUM 100 MCG PO TABS
250.0000 ug | ORAL_TABLET | Freq: Every morning | ORAL | Status: DC
  Administered 2020-02-23 – 2020-03-01 (×8): 250 ug via ORAL
  Filled 2020-02-21 (×7): qty 3
  Filled 2020-02-21: qty 2.5

## 2020-02-21 MED ORDER — HYDROCORTISONE NA SUCCINATE PF 100 MG IJ SOLR
50.0000 mg | Freq: Four times a day (QID) | INTRAMUSCULAR | Status: DC
  Administered 2020-02-21 – 2020-02-23 (×7): 50 mg via INTRAVENOUS
  Filled 2020-02-21 (×8): qty 2

## 2020-02-21 MED ORDER — DICLOFENAC SODIUM 1 % EX GEL: 2.0000 g | Freq: Four times a day (QID) | CUTANEOUS | Status: DC

## 2020-02-21 MED ORDER — SODIUM CHLORIDE 0.9 % IV SOLN
500.0000 mg | Freq: Once | INTRAVENOUS | Status: AC
  Administered 2020-02-21: 500 mg via INTRAVENOUS
  Filled 2020-02-21: qty 500

## 2020-02-21 MED ORDER — LACTATED RINGERS IV SOLN: INTRAVENOUS | Status: DC

## 2020-02-21 MED ORDER — BIOTIN 2.5 MG PO CAPS: 2.5000 mg | ORAL_CAPSULE | Freq: Every day | ORAL | Status: DC

## 2020-02-21 MED ORDER — SODIUM CHLORIDE 0.9 % IV SOLN
500.0000 mg | INTRAVENOUS | Status: DC
  Administered 2020-02-21 – 2020-02-24 (×4): 500 mg via INTRAVENOUS
  Filled 2020-02-21 (×5): qty 500

## 2020-02-21 MED ORDER — ADULT MULTIVITAMIN W/MINERALS CH
1.0000 | ORAL_TABLET | Freq: Every day | ORAL | Status: DC
  Administered 2020-02-22 – 2020-03-01 (×9): 1 via ORAL
  Filled 2020-02-21 (×9): qty 1

## 2020-02-21 MED ORDER — VANCOMYCIN HCL IN DEXTROSE 1-5 GM/200ML-% IV SOLN
1000.0000 mg | Freq: Once | INTRAVENOUS | Status: DC
Start: 2020-02-21 — End: 2020-02-21

## 2020-02-21 MED ORDER — NALOXONE HCL 2 MG/2ML IJ SOSY
1.0000 mg | PREFILLED_SYRINGE | Freq: Once | INTRAMUSCULAR | Status: AC
Start: 2020-02-21 — End: 2020-02-21

## 2020-02-21 MED ORDER — NALOXONE HCL 2 MG/2ML IJ SOSY
PREFILLED_SYRINGE | INTRAMUSCULAR | Status: AC
  Administered 2020-02-21: 01:00:00 1 mg via INTRAVENOUS
  Filled 2020-02-21: qty 2

## 2020-02-21 MED ORDER — LIDOCAINE 5 % EX PTCH
1.0000 | MEDICATED_PATCH | Freq: Every day | CUTANEOUS | Status: DC
  Administered 2020-02-21 – 2020-03-01 (×8): 1 via TRANSDERMAL
  Filled 2020-02-21 (×11): qty 1

## 2020-02-21 MED ORDER — BUPROPION HCL ER (XL) 150 MG PO TB24
150.0000 mg | ORAL_TABLET | Freq: Every day | ORAL | Status: DC
  Filled 2020-02-21: qty 1

## 2020-02-21 MED ORDER — METHYLPREDNISOLONE SODIUM SUCC 40 MG IJ SOLR
40.0000 mg | Freq: Two times a day (BID) | INTRAMUSCULAR | Status: DC
  Administered 2020-02-21: 40 mg via INTRAVENOUS
  Filled 2020-02-21: qty 1

## 2020-02-21 MED ORDER — SODIUM CHLORIDE 0.9 % IV SOLN
2.0000 g | INTRAVENOUS | Status: AC
  Administered 2020-02-22 – 2020-02-25 (×4): 2 g via INTRAVENOUS
  Filled 2020-02-21 (×5): qty 20

## 2020-02-21 MED ORDER — ENOXAPARIN SODIUM 40 MG/0.4ML ~~LOC~~ SOLN
40.0000 mg | Freq: Two times a day (BID) | SUBCUTANEOUS | Status: DC
  Administered 2020-02-21 – 2020-03-01 (×19): 40 mg via SUBCUTANEOUS
  Filled 2020-02-21 (×18): qty 0.4

## 2020-02-21 MED ORDER — IPRATROPIUM-ALBUTEROL 0.5-2.5 (3) MG/3ML IN SOLN
3.0000 mL | Freq: Four times a day (QID) | RESPIRATORY_TRACT | Status: DC | PRN
  Administered 2020-02-21 (×2): 3 mL via RESPIRATORY_TRACT

## 2020-02-21 MED ORDER — SODIUM CHLORIDE 0.9 % IV BOLUS
500.0000 mL | Freq: Once | INTRAVENOUS | Status: AC
  Administered 2020-02-21: 02:00:00 500 mL via INTRAVENOUS

## 2020-02-21 MED ORDER — ONDANSETRON HCL 4 MG/2ML IJ SOLN: 4.0000 mg | Freq: Four times a day (QID) | INTRAMUSCULAR | Status: DC | PRN

## 2020-02-21 MED ORDER — VANCOMYCIN VARIABLE DOSE PER UNSTABLE RENAL FUNCTION (PHARMACIST DOSING): Status: DC

## 2020-02-21 MED ORDER — ATORVASTATIN CALCIUM 20 MG PO TABS
20.0000 mg | ORAL_TABLET | Freq: Every day | ORAL | Status: DC
  Administered 2020-02-21 – 2020-02-29 (×9): 20 mg via ORAL
  Filled 2020-02-21 (×9): qty 1

## 2020-02-21 NOTE — Progress Notes (Signed)
[facility] [place] NAME: Madison Mcbride    MR#:  [id number]  DATE OF BIRTH:  1956-01-13  SUBJECTIVE:  husband at bedside patient much awake alert then early morning. She was very hard to wake up. Patient did eat lunch according to the nurse. Vitals stable. She is off IV norepinephrine complains of bilateral lower extremity pain. Denies any cough or fever at home  husband brought in with patient getting very weak for last couple days poor PO intake and unable to get up and walk around.  REVIEW OF SYSTEMS:   Review of Systems  Constitutional: Negative for chills, fever and weight loss.  HENT: Negative for ear discharge, ear pain and nosebleeds.   Eyes: Negative for blurred vision, pain and discharge.  Respiratory: Negative for sputum production, shortness of breath, wheezing and stridor.   Cardiovascular: Negative for chest pain, palpitations, orthopnea and PND.  Gastrointestinal: Negative for abdominal pain, diarrhea, nausea and vomiting.  Genitourinary: Negative for frequency and urgency.  Musculoskeletal: Positive for back pain and joint pain.  Skin:       Redness both le  Neurological: Positive for weakness. Negative for sensory change, speech change and focal weakness.  Psychiatric/Behavioral: Negative for depression and hallucinations. The patient is not nervous/anxious.    Tolerating Diet: Tolerating PT:   DRUG ALLERGIES:  No Known Allergies  VITALS:  Blood pressure (!) 124/105, pulse 63, temperature 99.2 F (37.3 C), temperature source Oral, resp. rate (!) 21, height 5\' 5"  (1.651 m), weight (!) 145.2 kg, SpO2 95 %.  PHYSICAL EXAMINATION:   Physical Exam  GENERAL:  64 y.o.-year-old patient lying in the bed with no acute distress. OBESE EYES: Pupils equal, round, reactive to light and accommodation. No scleral icterus.   HEENT: Head atraumatic, normocephalic. Oropharynx and nasopharynx clear.  NECK:  Supple, no jugular  venous distention. No thyroid enlargement, no tenderness.  LUNGS: DECREASED breath sounds bilaterally, no wheezing, rales, rhonchi. No use of accessory muscles of respiration.  CARDIOVASCULAR: S1, S2 normal. No murmurs, rubs, or gallops.  ABDOMEN: Soft, nontender, nondistended. Bowel sounds present. No organomegaly or mass.  EXTREMITIES:     NEUROLOGIC: Cranial nerves II through XII are intact. No focal Motor or sensory deficits b/l.   PSYCHIATRIC:  patient is alert and oriented x 3.  SKIN: AS above  LABORATORY PANEL:  CBC Recent Labs  Lab 02/21/20 0949  WBC 20.8*  HGB 9.1*  HCT 31.7*  PLT 397    Chemistries  Recent Labs  Lab 02/20/20 2128 02/20/20 2128 02/21/20 0949  NA 137  --   --   K 4.5  --   --   CL 95*  --   --   CO2 29  --   --   GLUCOSE 115*  --   --   BUN 48*  --   --   CREATININE 1.57*   < > 2.33*  CALCIUM 8.9  --   --   AST 47*  --   --   ALT 26  --   --   ALKPHOS 119  --   --   BILITOT 1.8*  --   --    < > = values in this interval not displayed.   Cardiac Enzymes No results for input(s): TROPONINI in the last 168 hours. RADIOLOGY:  US Venous Img Lower Bilateral  Result Date: 02/20/2020 EXAM: BILATERAL LOWER EXTREMITY VENOUS DOPPLER ULTRASOUND TECHNIQUE: Gray-scale sonography with graded compression, as well  as color Doppler and duplex ultrasound were performed to evaluate the lower extremity deep venous systems from the level of the common femoral vein and including the common femoral, femoral, profunda femoral, popliteal and calf veins including the posterior tibial, peroneal and gastrocnemius veins when visible. The superficial great saphenous vein was also interrogated. Spectral Doppler was utilized to evaluate flow at rest and with distal augmentation maneuvers in the common femoral, femoral and popliteal veins. COMPARISON:  None. FINDINGS: RIGHT LOWER EXTREMITY Common Femoral Vein: No evidence of thrombus. Normal compressibility, respiratory phasicity  and response to augmentation. Saphenofemoral Junction: No evidence of thrombus. Normal compressibility and flow on color Doppler imaging. Profunda Femoral Vein: No evidence of thrombus. Normal compressibility and flow on color Doppler imaging. Femoral Vein: No evidence of thrombus. Normal compressibility, respiratory phasicity and response to augmentation. Popliteal Vein: No evidence of thrombus. Normal compressibility, respiratory phasicity and response to augmentation. Calf Veins: No evidence of thrombus. Normal compressibility and flow on color Doppler imaging. Peroneal vein not visualized. Superficial Great Saphenous Vein: No evidence of thrombus. Normal compressibility. Venous Reflux:  None. Other Findings: Mildly complex hypoechoic collection at the right popliteal fossa of measures 7.2 x 1.6 x 2.4 cm, consistent with a Baker cyst. Mild diffuse soft tissue/interstitial edema noted within the soft tissues of the lower leg/calf. LEFT LOWER EXTREMITY Common Femoral Vein: No evidence of thrombus. Normal compressibility, respiratory phasicity and response to augmentation. Saphenofemoral Junction: No evidence of thrombus. Normal compressibility and flow on color Doppler imaging. Profunda Femoral Vein: No evidence of thrombus. Normal compressibility and flow on color Doppler imaging. Femoral Vein: No evidence of thrombus. Normal compressibility, respiratory phasicity and response to augmentation. Popliteal Vein: No evidence of thrombus. Normal compressibility, respiratory phasicity and response to augmentation. Calf Veins: No evidence of thrombus. Normal compressibility and flow on color Doppler imaging. Peroneal vein not visualized. Superficial Great Saphenous Vein: No evidence of thrombus. Normal compressibility. Venous Reflux:  None. Other Findings: Hypoechoic collection at the left popliteal fossa measures 1.7 x 0.6 x 1.8 cm, suggesting a small Baker cyst. Mild diffuse soft tissue/interstitial edema noted within  the soft tissues of the lower leg/calf. IMPRESSION: 1. No evidence of deep venous thrombosis in either lower extremity. 2. Mild diffuse soft tissue/interstitial edema within the lower legs/calves bilaterally. 3. Bilateral Baker's cysts as detailed above, right larger than left. Electronically Signed   By: Jeannine Boga M.D.   On: 02/20/2020 21:23   DG Chest Portable 1 View  Result Date: 02/21/2020 CLINICAL DATA:  64 year old female with shortness of breath, severe hypoxia. COVID-19. Status pending. EXAM: PORTABLE CHEST 1 VIEW COMPARISON:  Portable chest 01/25/2014. FINDINGS: Portable AP upright view at 0122 hours. Lower lung volumes. Mild cardiomegaly. Other mediastinal contours are within normal limits. Visualized tracheal air column is within normal limits. Streaky bilateral perihilar opacity. More confluent left lung base opacity. No superimposed pneumothorax. No definite pleural effusions. No acute osseous abnormality identified. IMPRESSION: Low lung volumes with streaky bilateral lung opacity, possible left lung base consolidation. The appearance is nonspecific but consider pneumonia, including viral/atypical etiologies. Aspiration is also in the differential. Electronically Signed   By: Genevie Ann M.D.   On: 02/21/2020 01:34   ASSESSMENT AND PLAN:  64 year old female with history of HTN, hypothyroidism, diabetes, OSA who presents to the emergency room presenting initially with bilateral leg pain becoming unresponsive, hypotensive and hypoxic while in the ER Chest x-ray consistent with pneumonia, urinalysis unremarkable.  Severe sepsis ([place])   CAP (community acquired pneumonia)  Bilateral cellulitis of lower leg -Borderline severe sepsis criteria with leukocytosis without fever or elevated lactic acid level but had multi organ derangement with hypotension, AKI, acute encephalopathy and hypoxia with pneumonia on chest x-ray and possible lower extremity cellulitis and required Norepinephrine for  couple hours -sepsis improving -Blood pressure responded to IV fluid bolus and Nor-epinephrin--now off IV pressors -Continue IV hydration -cont IV Rocephin  And azithromycin  -Supplemental oxygen --procalcitonin 2.03 -UA negative  Leucocytosis Came in wbc of 20K -BC so far negative    Acute respiratory failure with hypoxia (HCC) suspected due to pneumonia -Supplemental oxygen to keep sats over 92% -Patient had bilateral lower extremity Dopplers that were negative for acute DVT -cont PRN bronchodilators and try to wean off oxygen when able to  Bilateral lower extremity pain -Lower extremity Doppler negative, suspect related to cellulitis  AKI (acute kidney injury) ([place]) -baseline creat 1.1 --came in with creat 1.57--IVF --3.29    Acute metabolic encephalopathy--improved -Related to above -Fall and aspiration precautions -hold sedating meds    Type 2 diabetes mellitus without complication (HCC) -Sliding scale insulin coverage -A1c 7.4% On March 2021    Hypothyroidism -Continue home meds    Morbid obesity with BMI of 50.0-59.9, adult ([place]) - complicates overall prognosis and care    DVT prophylaxis: Lovenox  Code Status: full code  Family Communication:   husband at bedside Disposition Plan: Back to previous home environment Consults called: none  Status:At the time of admission, it appears that the appropriate admission status for this patient is INPATIENT. Patient is being treated for sepsis secondary to cellulitis and pneumonia she is acute renal failure as well   TOTAL TIME TAKING CARE OF THIS PATIENT: *30* minutes.  >50% time spent on counselling and coordination of care  Note: This dictation was prepared with Dragon dictation along with smaller phrase technology. Any transcriptional errors that result from this process are unintentional.  Fritzi Mandes M.D    [facility]   CC: Primary care physician; Rusty Aus, MDPatient ID: Madison Mcbride, female   DOB: 03/06/1956, 64 y.o.   MRN: [id number]

## 2020-02-21 NOTE — ED Notes (Addendum)
Pt responds to voice w spontaneous eye opening. Able to answer RN questions, A&Ox4 at this time. Pt obeys commands, lifts arms and repositioned self over in bed.

## 2020-02-21 NOTE — ED Notes (Signed)
Pt resting at this time, responds to physical stimuli. Pt A&Ox4 when awakened and able to answer RN questions.

## 2020-02-21 NOTE — H&P (Signed)
History and Physical    Madison Mcbride SKA:[id number] DOB: [birth year] DOA: 02/20/2020  PCP: Rusty Aus, MD   Patient coming from: Home  I have personally briefly reviewed patient's old medical records in [place]  Chief Complaint: Pain in legs  HPI: Madison Mcbride is a 64 y.o. female with medical history significant for HTN, hypothyroidism, diabetes, OSA who presents to the emergency room initially because of pain in bilateral lower extremities.  She was worked up in fast track where she underwent bilateral lower extremity Dopplers that were negative for DVT and was about to be discharged with a diagnosis of cellulitis when she was found unresponsive in the room with O2 sat 55% on room air.  She was placed on nonrebreather with O2 sat picking up to 95.  Blood pressure was as low as 80/40 requiring 3 L sepsis fluid bolus.  Further work-up in the emergency room, revealed white cell count of 20,000, creatinine 1.57 over baseline of 1.14, lactic acid was normal at 1.3/1.4.  Chest x-ray consistent with pneumonia, urinalysis unremarkable.  Patient responded to IV fluid bolus with improvement in blood pressure to 130/98 by admission.  She was still requiring 3 L O2 to keep sats in the mid 90s.  She did become more awake but remains somewhat c lethargic and confused Hospitalist consulted for admission.  Review of Systems: Unable to obtain due to lethargy  Past Medical History:  Diagnosis Date   Diabetes mellitus without complication ([place])    Hypothyroidism    RLS (restless legs syndrome)    Thyroid cancer ([place]) 2007    Past Surgical History:  Procedure Laterality Date   ABDOMINAL HYSTERECTOMY     COLONOSCOPY WITH PROPOFOL N/A 08/26/2015   Procedure: COLONOSCOPY WITH PROPOFOL;  Surgeon: Manya Silvas, MD;  Location: [facility];  Service: Endoscopy;  Laterality: N/A;   THYROIDECTOMY       reports that she has never smoked. She has never used smokeless  tobacco. She reports that she does not drink alcohol and does not use drugs.  No Known Allergies  Family History  Problem Relation Age of Onset   Breast cancer Neg Hx       Prior to Admission medications   Medication Sig Start Date End Date Taking? Authorizing Provider  acetaminophen (TYLENOL) 500 MG tablet Take 1,000 mg by mouth every 8 (eight) hours.   Yes [provider]  atorvastatin (LIPITOR) 20 MG tablet Take 20 mg by mouth daily at 10 pm. 01/31/20 01/30/21 Yes [provider]  diclofenac Sodium (VOLTAREN) 1 % GEL Apply 2 g topically 4 (four) times daily. 01/31/20  Yes [provider]  metolazone (ZAROXOLYN) 2.5 MG tablet Take 2.5 mg by mouth daily as needed.  01/31/18  Yes [provider]  SYNTHROID 125 MCG tablet Take 250 mcg by mouth every morning. 01/30/20  Yes [provider]  valsartan (DIOVAN) 160 MG tablet Take 160 mg by mouth daily. 01/30/20  Yes [provider]  Biotin 2.5 MG CAPS Take 5,000 capsules by mouth as directed.    [provider]  buPROPion (WELLBUTRIN XL) 150 MG 24 hr tablet Take 150 mg by mouth daily.    [provider]  clindamycin (CLEOCIN) 300 MG capsule Take 1 capsule (300 mg total) by mouth 4 (four) times daily. 02/20/20   Cuthriell, Charline Bills, PA-C  furosemide (LASIX) 40 MG tablet Take 40 mg by mouth daily. 01/27/20   [provider]  gabapentin (NEURONTIN)  300 MG capsule Take 300 mg by mouth 2 (two) times daily as needed. 01/30/20   [provider]  levofloxacin (LEVAQUIN) 750 MG tablet levofloxacin 750 mg tablet  TAKE 1 TABLET BY MOUTH EVERY DAY    [provider]  lidocaine (LIDODERM) 5 % Place 1 patch onto the skin See admin instructions. Apply 1 patch on the skin for 12 hours each day and then remove. 01/26/20   [provider]  meloxicam (MOBIC) 7.5 MG tablet Take 1 tablet (7.5 mg total) by mouth daily. 02/20/20 02/19/21  Cuthriell, Charline Bills, PA-C    metFORMIN (GLUCOPHAGE) 500 MG tablet Take 250 mg by mouth daily with breakfast. Patient not taking: Reported on 02/21/2020    [provider]  Multiple Vitamin (MULTIVITAMIN) tablet Take 1 tablet by mouth daily.    [provider]  mupirocin cream (BACTROBAN) 2 % APPLY TO AFFECTED AREA TWICE A DAY 11/09/17   [provider]  rOPINIRole (REQUIP) 3 MG tablet Take 3 mg by mouth 3 (three) times daily. 01/20/18   [provider]  senna-docusate (SENOKOT-S) 8.6-50 MG tablet Take 1 tablet by mouth 2 (two) times daily.    [provider]  traMADol (ULTRAM) 50 MG tablet Take by mouth every 6 (six) hours as needed.    [provider]  Triamterene (DYRENIUM PO) triamterene    [provider]  valsartan-hydrochlorothiazide (DIOVAN-HCT) 160-12.5 MG tablet Take 1 tablet by mouth daily.    [provider]  zinc gluconate 50 MG tablet Take 50 mg by mouth daily.    [provider]    Physical Exam: Vitals:   02/21/20 0201 02/21/20 0210 02/21/20 0400 02/21/20 0421  BP:  (!) 89/47 (!) 84/33 (!) 97/52  Pulse: 68 69 65 66  Resp: 19 (!) 22 (!) 21 (!) 22  Temp:      TempSrc:      SpO2: 98% 96% 95% 100%  Weight:      Height:         Vitals:   02/21/20 0201 02/21/20 0210 02/21/20 0400 02/21/20 0421  BP:  (!) 89/47 (!) 84/33 (!) 97/52  Pulse: 68 69 65 66  Resp: 19 (!) 22 (!) 21 (!) 22  Temp:      TempSrc:      SpO2: 98% 96% 95% 100%  Weight:      Height:          Constitutional:  Lethargic, but oriented x3 HEENT:      Head: Normocephalic and atraumatic.         Eyes: PERLA, EOMI, Conjunctivae are normal. Sclera is non-icteric.       Mouth/Throat: Mucous membranes are moist.       Neck: Supple with no signs of meningismus. Cardiovascular: Regular rate and rhythm. No murmurs, gallops, or rubs. 2+ symmetrical distal pulses are present . No JVD. No 2+ LE edema Respiratory: Respiratory effort increased.Lungs sounds  diminished bilaterally.  Coarse breath sounds bilaterally Gastrointestinal: Soft, non tender, and non distended with positive bowel sounds. No rebound or guarding. Genitourinary: No CVA tenderness. Musculoskeletal:  Redness swelling bilateral lower extremities. Neurologic: . Face is symmetric. Moving all extremities. No gross focal neurologic deficits . Skin: Skin is warm, dry.  erythema anterior shins Psychiatric: Mood and affect are normal Speech and behavior are normal   Labs on Admission: I have personally reviewed following labs and imaging studies  CBC: Recent Labs  Lab 02/20/20 2128  WBC 19.3*  NEUTROABS 16.3*  HGB 11.0*  HCT 36.6  MCV 91.5  PLT 144*   Basic Metabolic Panel: Recent Labs  Lab 02/20/20 2128  NA 137  K 4.5  CL 95*  CO2 29  GLUCOSE 115*  BUN 48*  CREATININE 1.57*  CALCIUM 8.9   GFR: Estimated Creatinine Clearance: 52.1 mL/min (A) (by C-G formula based on SCr of 1.57 mg/dL (H)). Liver Function Tests: Recent Labs  Lab 02/20/20 2128  AST 47*  ALT 26  ALKPHOS 119  BILITOT 1.8*  PROT 7.8  ALBUMIN 2.6*   No results for input(s): LIPASE, AMYLASE in the last 168 hours. No results for input(s): AMMONIA in the last 168 hours. Coagulation Profile: No results for input(s): INR, PROTIME in the last 168 hours. Cardiac Enzymes: No results for input(s): CKTOTAL, CKMB, CKMBINDEX, TROPONINI in the last 168 hours. BNP (last 3 results) No results for input(s): PROBNP in the last 8760 hours. HbA1C: No results for input(s): HGBA1C in the last 72 hours. CBG: Recent Labs  Lab 02/21/20 0047  GLUCAP 134*   Lipid Profile: No results for input(s): CHOL, HDL, LDLCALC, TRIG, CHOLHDL, LDLDIRECT in the last 72 hours. Thyroid Function Tests: No results for input(s): TSH, T4TOTAL, FREET4, T3FREE, THYROIDAB in the last 72 hours. Anemia Panel: No results for input(s): VITAMINB12, FOLATE, FERRITIN, TIBC, IRON, RETICCTPCT in the last 72 hours. Urine analysis:      Component Value Date/Time   COLORURINE YELLOW (A) 02/21/2020 0342   APPEARANCEUR HAZY (A) 02/21/2020 0342   APPEARANCEUR CLOUDY 01/15/2014 0209   LABSPEC 1.010 02/21/2020 0342   LABSPEC 1.014 01/15/2014 0209   PHURINE 6.0 02/21/2020 0342   GLUCOSEU NEGATIVE 02/21/2020 0342   GLUCOSEU NEGATIVE 01/15/2014 0209   HGBUR NEGATIVE 02/21/2020 0342   BILIRUBINUR NEGATIVE 02/21/2020 0342   BILIRUBINUR NEGATIVE 01/15/2014 0209   KETONESUR NEGATIVE 02/21/2020 0342   PROTEINUR NEGATIVE 02/21/2020 0342   NITRITE NEGATIVE 02/21/2020 0342   LEUKOCYTESUR NEGATIVE 02/21/2020 0342   LEUKOCYTESUR 2+ 01/15/2014 0209    Radiological Exams on Admission: US Venous Img Lower Bilateral  Result Date: 02/20/2020 EXAM: BILATERAL LOWER EXTREMITY VENOUS DOPPLER ULTRASOUND TECHNIQUE: Gray-scale sonography with graded compression, as well as color Doppler and duplex ultrasound were performed to evaluate the lower extremity deep venous systems from the level of the common femoral vein and including the common femoral, femoral, profunda femoral, popliteal and calf veins including the posterior tibial, peroneal and gastrocnemius veins when visible. The superficial great saphenous vein was also interrogated. Spectral Doppler was utilized to evaluate flow at rest and with distal augmentation maneuvers in the common femoral, femoral and popliteal veins. COMPARISON:  None. FINDINGS: RIGHT LOWER EXTREMITY Common Femoral Vein: No evidence of thrombus. Normal compressibility, respiratory phasicity and response to augmentation. Saphenofemoral Junction: No evidence of thrombus. Normal compressibility and flow on color Doppler imaging. Profunda Femoral Vein: No evidence of thrombus. Normal compressibility and flow on color Doppler imaging. Femoral Vein: No evidence of thrombus. Normal compressibility, respiratory phasicity and response to augmentation. Popliteal Vein: No evidence of thrombus. Normal compressibility, respiratory  phasicity and response to augmentation. Calf Veins: No evidence of thrombus. Normal compressibility and flow on color Doppler imaging. Peroneal vein not visualized. Superficial Great Saphenous Vein: No evidence of thrombus. Normal compressibility. Venous Reflux:  None. Other Findings: Mildly complex hypoechoic collection at the right popliteal fossa of measures 7.2 x 1.6 x 2.4 cm, consistent with a Baker cyst. Mild diffuse soft tissue/interstitial edema noted within the soft tissues of the lower leg/calf. LEFT LOWER EXTREMITY Common  Femoral Vein: No evidence of thrombus. Normal compressibility, respiratory phasicity and response to augmentation. Saphenofemoral Junction: No evidence of thrombus. Normal compressibility and flow on color Doppler imaging. Profunda Femoral Vein: No evidence of thrombus. Normal compressibility and flow on color Doppler imaging. Femoral Vein: No evidence of thrombus. Normal compressibility, respiratory phasicity and response to augmentation. Popliteal Vein: No evidence of thrombus. Normal compressibility, respiratory phasicity and response to augmentation. Calf Veins: No evidence of thrombus. Normal compressibility and flow on color Doppler imaging. Peroneal vein not visualized. Superficial Great Saphenous Vein: No evidence of thrombus. Normal compressibility. Venous Reflux:  None. Other Findings: Hypoechoic collection at the left popliteal fossa measures 1.7 x 0.6 x 1.8 cm, suggesting a small Baker cyst. Mild diffuse soft tissue/interstitial edema noted within the soft tissues of the lower leg/calf. IMPRESSION: 1. No evidence of deep venous thrombosis in either lower extremity. 2. Mild diffuse soft tissue/interstitial edema within the lower legs/calves bilaterally. 3. Bilateral Baker's cysts as detailed above, right larger than left. Electronically Signed   By: Jeannine Boga M.D.   On: 02/20/2020 21:23   DG Chest Portable 1 View  Result Date: 02/21/2020 CLINICAL DATA:   64 year old female with shortness of breath, severe hypoxia. COVID-19. Status pending. EXAM: PORTABLE CHEST 1 VIEW COMPARISON:  Portable chest 01/25/2014. FINDINGS: Portable AP upright view at 0122 hours. Lower lung volumes. Mild cardiomegaly. Other mediastinal contours are within normal limits. Visualized tracheal air column is within normal limits. Streaky bilateral perihilar opacity. More confluent left lung base opacity. No superimposed pneumothorax. No definite pleural effusions. No acute osseous abnormality identified. IMPRESSION: Low lung volumes with streaky bilateral lung opacity, possible left lung base consolidation. The appearance is nonspecific but consider pneumonia, including viral/atypical etiologies. Aspiration is also in the differential. Electronically Signed   By: Genevie Ann M.D.   On: 02/21/2020 01:34    EKG: Independently reviewed. Interpretation : Sinus rhythm  Assessment/Plan 64 year old female with history of HTN, hypothyroidism, diabetes, OSA who presents to the emergency room presenting initially with bilateral leg pain becoming unresponsive, hypotensive and hypoxic while in the ER  Suspect severe sepsis ([place])   CAP (community acquired pneumonia)   Bilateral cellulitis of lower leg -Borderline severe sepsis criteria alert patient had leukocytosis without fever or elevated lactic acid level but had multi organ derangement with hypotension, AKI, acute encephalopathy and hypoxia with pneumonia on chest x-ray and possible lower extremity cellulitis -Blood pressure responded to IV fluid bolus -Continue IV hydration -Rocephin azithromycin and vancomycin -Supplemental oxygen    Acute respiratory failure with hypoxia (H[facility]) -Supplemental oxygen to keep sats over 92% -Consider CTA chest to evaluate for PE when more stable -Patient had bilateral lower extremity Dopplers that were negative for acute DVT  Bilateral lower extremity pain -Lower extremity Doppler negative, suspect  related to cellulitis    AKI (acute kidney injury) ([place]) -Creatinine 1.57, should improve with hydration    Acute metabolic encephalopathy -Related to above -Fall and aspiration precautions -Neurochecks    Type 2 diabetes mellitus without complication ([facility]) -Sliding scale insulin coverage    Hypothyroidism -Continue home meds    Morbid obesity with BMI of 50.0-59.9, adult ([place]) -This complicates overall prognosis and care    DVT prophylaxis: Lovenox  Code Status: full code  Family Communication:  none  Disposition Plan: Back to previous home environment Consults called: none  Status:At the time of admission, it appears that the appropriate admission status for this patient is INPATIENT. This is judged to be reasonable and necessary  in order to provide the required intensity of service to ensure the patient's safety given the presenting symptoms, physical exam findings, and initial radiographic and laboratory data in the context of their  Comorbid conditions.   Patient requires inpatient status due to high intensity of service, high risk for further deterioration and high frequency of surveillance required.   I certify that at the point of admission it is my clinical judgment that the patient will require inpatient hospital care spanning beyond [place] MD [facility]     02/21/2020, 6:03 AM

## 2020-02-21 NOTE — Progress Notes (Addendum)
CODE SEPSIS - PHARMACY COMMUNICATION  **[facility] Spectrum Antibiotics should be administered within 1 hour of Sepsis diagnosis**  Time Code Sepsis Called/Page Received: 0981  Antibiotics Ordered: azithromycin/ceftriaxone/cefazolin  Time of 1st antibiotic administration: 0424  Additional action taken by pharmacy:   If necessary, Name of Provider/Nurse Contacted:     Tobie Lords ,PharmD Clinical Pharmacist  02/21/2020  4:38 AM

## 2020-02-21 NOTE — Consult Note (Addendum)
Name: Madison Mcbride MRN: [id number] DOB: 03-Mar-1956     CONSULTATION DATE: 02/20/2020   HISTORY OF PRESENT ILLNESS:   64 years old lady with history of morbid obesity, hypertension, hypothyroidism, obstructive sleep apnea, diabetes mellitus.  The patient is admitted to the hospitalist service with right leg cellulitis and DVT was ruled out with venous Doppler.  While in the ED she developed hypotension and unresponsiveness after receiving narcotics for pain control. All history was obtained from the medical record and the primary care team.  PAST MEDICAL HISTORY :   has a past medical history of Diabetes mellitus without complication ([place]), Hypothyroidism, RLS (restless legs syndrome), and Thyroid cancer ([place]) (2007).  has a past surgical history that includes Abdominal hysterectomy; Thyroidectomy; and Colonoscopy with propofol (N/A, 08/26/2015). Prior to Admission medications   Medication Sig Start Date End Date Taking? Authorizing Provider  acetaminophen (TYLENOL) 500 MG tablet Take 1,000 mg by mouth every 8 (eight) hours.   Yes [provider]  atorvastatin (LIPITOR) 20 MG tablet Take 20 mg by mouth daily at 10 pm. 01/31/20 01/30/21 Yes [provider]  Biotin 2.5 MG CAPS Take 2.5 mg by mouth daily.    Yes [provider]  buPROPion (WELLBUTRIN XL) 150 MG 24 hr tablet Take 150 mg by mouth daily.   Yes [provider]  diclofenac Sodium (VOLTAREN) 1 % GEL Apply 2 g topically 4 (four) times daily. 01/31/20  Yes [provider]  furosemide (LASIX) 40 MG tablet Take 40 mg by mouth daily. 01/27/20  Yes [provider]  gabapentin (NEURONTIN) 300 MG capsule Take 300 mg by mouth 2 (two) times daily as needed. 01/30/20  Yes [provider]  metolazone (ZAROXOLYN) 2.5 MG tablet Take 2.5 mg by mouth daily.   Yes [provider]  Multiple Vitamin (MULTIVITAMIN) tablet Take 1 tablet by mouth daily.   Yes [provider]  OZEMPIC, 0.25 OR 0.5 MG/DOSE, 2 MG/1.5ML SOPN Inject 0.375 mLs into the skin once a week. 02/04/20  Yes [provider]  propranolol (INDERAL) 20 MG tablet Take 20 mg by mouth 3 (three) times daily. 01/19/20  Yes [provider]  rOPINIRole (REQUIP) 3 MG tablet Take 3 mg by mouth 3 (three) times daily. 01/20/18  Yes [provider]  SYNTHROID 125 MCG tablet Take 250 mcg by mouth every morning. 01/30/20  Yes [provider]  triamterene-hydrochlorothiazide (MAXZIDE) 75-50 MG tablet Take 1 tablet by mouth daily. 01/11/20  Yes [provider]  valsartan (DIOVAN) 160 MG tablet Take 160 mg by mouth daily. 01/30/20  Yes [provider]  clindamycin (CLEOCIN) 300 MG capsule Take 1 capsule (300 mg total) by mouth 4 (four) times daily. 02/20/20   Cuthriell, Charline Bills, PA-C  DULoxetine (CYMBALTA) 30 MG capsule Take 90 mg by mouth daily. 01/27/20   [provider]  lidocaine (LIDODERM) 5 % Place 1 patch onto the skin See admin instructions. Apply 1 patch on the skin for 12 hours each day and then remove. 01/26/20   [provider]  meloxicam (MOBIC) 7.5 MG tablet Take 1 tablet (7.5 mg total) by mouth daily. 02/20/20 02/19/21  Cuthriell, Charline Bills, PA-C  metFORMIN (GLUCOPHAGE) 500 MG tablet Take 250 mg by mouth daily with breakfast. Patient not taking: Reported on 02/21/2020    [provider]  potassium chloride (KLOR-CON) 10 MEQ tablet Take 10 mEq by mouth daily. 12/16/19   [provider]   No Known Allergies  FAMILY  HISTORY:  family history is not on file. SOCIAL HISTORY:  reports that she has never smoked. She has never used smokeless tobacco. She reports that she does not drink alcohol and does not use drugs.  REVIEW OF SYSTEMS:   Unable to obtain due to critical illness      Estimated body mass index is 53.25 kg/m as calculated from the following:   Height as of this encounter: 5\' 5"  (1.651 m).   Weight as of  this encounter: 145.2 kg.    VITAL SIGNS: Temp:  [99.2 F (37.3 C)] 99.2 F (37.3 C) (08/10 1916) Pulse Rate:  [59-77] 63 (08/11 1442) Resp:  [15-26] 21 (08/11 1442) BP: (75-132)/(28-105) 124/105 (08/11 1442) SpO2:  [92 %-100 %] 95 % (08/11 1442) Weight:  [145.2 kg] 145.2 kg (08/10 1914)   I/O last 3 completed shifts: In: 2100 [IV Piggyback:2100] Out: -  Total I/O In: 10 [Other:10] Out: 450 [Urine:450]   SpO2: 95 %   Physical Examination:  Somnolent, arousable, verbally responsive with no focal motor deficits Tolerating nasal cannula, no distress, able to talk in full sentences, bilateral equal air entry and no adventitious sounds.  Dry mucous membranes S1 & S2 are audible with no murmur Benign obese abdomen with feeble peristalsis bil leg cellutitis    CULTURE RESULTS   Recent Results (from the past 240 hour(s))  Blood culture (routine x 2)     Status: None (Preliminary result)   Collection Time: 02/21/20  2:30 AM   Specimen: BLOOD  Result Value Ref Range Status   Specimen Description BLOOD LEFT FA  Final   Special Requests   Final    BOTTLES DRAWN AEROBIC AND ANAEROBIC Blood Culture adequate volume   Culture   Final    NO GROWTH < 12 HOURS Performed at [facility], [address] [place], [place] [address]    Report Status PENDING  Incomplete  Blood culture (routine x 2)     Status: None (Preliminary result)   Collection Time: 02/21/20  2:30 AM   Specimen: BLOOD  Result Value Ref Range Status   Specimen Description BLOOD RIGHT FA  Final   Special Requests   Final    BOTTLES DRAWN AEROBIC AND ANAEROBIC Blood Culture adequate volume   Culture   Final    NO GROWTH < 12 HOURS Performed at [facility], [address], [place], [place] [address]    Report Status PENDING  Incomplete  SARS Coronavirus 2 by RT PCR (hospital order, performed in [place] hospital lab) Nasopharyngeal Nasopharyngeal Swab     Status: None   Collection Time:  02/21/20  2:30 AM   Specimen: Nasopharyngeal Swab  Result Value Ref Range Status   SARS Coronavirus 2 NEGATIVE NEGATIVE Final    Comment: (NOTE) SARS-CoV-2 target nucleic acids are NOT DETECTED.  The SARS-CoV-2 RNA is generally detectable in upper and lower respiratory specimens during the acute phase of infection. The lowest concentration of SARS-CoV-2 viral copies this assay can detect is 250 copies / mL. A negative result does not preclude SARS-CoV-2 infection and should not be used as the sole basis for treatment or other patient management decisions.  A negative result may occur with improper specimen collection / handling, submission of specimen other than nasopharyngeal swab, presence of viral mutation(s) within the areas targeted by this assay, and inadequate number of viral copies (<250 copies / mL). A negative result must be combined with clinical observations, patient history, and epidemiological information.  Fact Sheet for Patients:   StrictlyIdeas.no  Fact Sheet for Healthcare Providers: BankingDealers.co.za  This test is not yet approved or  cleared by the [place] FDA and has been authorized for detection and/or diagnosis of SARS-CoV-2 by FDA under an Emergency Use Authorization (EUA).  This EUA will remain in effect (meaning this test can be used) for the duration of the COVID-19 declaration under Section 564(b)(1) of the Act, 21 U.S.C. section 360bbb-3(b)(1), unless the authorization is terminated or revoked sooner.  Performed at [facility], [address] [place], [place] [address]           IMAGING    US Venous Img Lower Bilateral  Result Date: 02/20/2020 EXAM: BILATERAL LOWER EXTREMITY VENOUS DOPPLER ULTRASOUND TECHNIQUE: Gray-scale sonography with graded compression, as well as color Doppler and duplex ultrasound were performed to evaluate the lower extremity deep venous systems from  the level of the common femoral vein and including the common femoral, femoral, profunda femoral, popliteal and calf veins including the posterior tibial, peroneal and gastrocnemius veins when visible. The superficial great saphenous vein was also interrogated. Spectral Doppler was utilized to evaluate flow at rest and with distal augmentation maneuvers in the common femoral, femoral and popliteal veins. COMPARISON:  None. FINDINGS: RIGHT LOWER EXTREMITY Common Femoral Vein: No evidence of thrombus. Normal compressibility, respiratory phasicity and response to augmentation. Saphenofemoral Junction: No evidence of thrombus. Normal compressibility and flow on color Doppler imaging. Profunda Femoral Vein: No evidence of thrombus. Normal compressibility and flow on color Doppler imaging. Femoral Vein: No evidence of thrombus. Normal compressibility, respiratory phasicity and response to augmentation. Popliteal Vein: No evidence of thrombus. Normal compressibility, respiratory phasicity and response to augmentation. Calf Veins: No evidence of thrombus. Normal compressibility and flow on color Doppler imaging. Peroneal vein not visualized. Superficial Great Saphenous Vein: No evidence of thrombus. Normal compressibility. Venous Reflux:  None. Other Findings: Mildly complex hypoechoic collection at the right popliteal fossa of measures 7.2 x 1.6 x 2.4 cm, consistent with a Baker cyst. Mild diffuse soft tissue/interstitial edema noted within the soft tissues of the lower leg/calf. LEFT LOWER EXTREMITY Common Femoral Vein: No evidence of thrombus. Normal compressibility, respiratory phasicity and response to augmentation. Saphenofemoral Junction: No evidence of thrombus. Normal compressibility and flow on color Doppler imaging. Profunda Femoral Vein: No evidence of thrombus. Normal compressibility and flow on color Doppler imaging. Femoral Vein: No evidence of thrombus. Normal compressibility, respiratory phasicity and  response to augmentation. Popliteal Vein: No evidence of thrombus. Normal compressibility, respiratory phasicity and response to augmentation. Calf Veins: No evidence of thrombus. Normal compressibility and flow on color Doppler imaging. Peroneal vein not visualized. Superficial Great Saphenous Vein: No evidence of thrombus. Normal compressibility. Venous Reflux:  None. Other Findings: Hypoechoic collection at the left popliteal fossa measures 1.7 x 0.6 x 1.8 cm, suggesting a small Baker cyst. Mild diffuse soft tissue/interstitial edema noted within the soft tissues of the lower leg/calf. IMPRESSION: 1. No evidence of deep venous thrombosis in either lower extremity. 2. Mild diffuse soft tissue/interstitial edema within the lower legs/calves bilaterally. 3. Bilateral Baker's cysts as detailed above, right larger than left. Electronically Signed   By: Jeannine Boga M.D.   On: 02/20/2020 21:23   DG Chest Portable 1 View  Result Date: 02/21/2020 CLINICAL DATA:  64 year old female with shortness of breath, severe hypoxia. COVID-19. Status pending. EXAM: PORTABLE CHEST 1 VIEW COMPARISON:  Portable chest 01/25/2014. FINDINGS: Portable AP upright view at 0122 hours. Lower lung volumes. Mild  cardiomegaly. Other mediastinal contours are within normal limits. Visualized tracheal air column is within normal limits. Streaky bilateral perihilar opacity. More confluent left lung base opacity. No superimposed pneumothorax. No definite pleural effusions. No acute osseous abnormality identified. IMPRESSION: Low lung volumes with streaky bilateral lung opacity, possible left lung base consolidation. The appearance is nonspecific but consider pneumonia, including viral/atypical etiologies. Aspiration is also in the differential. Electronically Signed   By: Genevie Ann M.D.   On: 02/21/2020 01:34     Assessment and plan: Acute hypercapnic and hypoxic respiratory failure with underlying chronic respiratory failure with  obesity hypoventilation syndrome  Altered mental status with toxic metabolic encephalopathy narcotics.  Improved, patient is verbally responsive and nonfocal neuro exam.  Hypotension with possible septic etiology/due to narcotics responded to volume resuscitation. -Monitor hemodynamics  Atelectasis and pneumonia.  Bibasilar airspace disease -Rocephin and Zithromax. -Monitor chest x-ray, CBC and FiO2 requirement  Cellulitis of both lower extremity.  DVT is ruled out by venous Doppler. -Continue with antimicrobial, follow with blood culture and monitor white cell count  AKI -Monitor renal panel and urine output. -Renal ultrasound to rule out obstructive uropathy  Hypothyroidism on levothyroxine -Monitor free T4  Anemia -Keep hemoglobin more than 7 g/dL  DVT prophylaxis.  Continue supportive care Continue with care as per primary care team  Critical care time 35 minutes

## 2020-02-21 NOTE — Progress Notes (Signed)
Patient ID: Madison Mcbride, female   DOB: Aug 25, 1955, 64 y.o.   MRN: [id number] RN informed patient's blood pressure down 80s fever over 44 with map of 55. Will increase her rate of fluids 200 mL per hour. Start patient on solucortef 50 mg q6 hourly IV and resume norepinephrine. Discussed with ICU charge nurse will place her again on step down unit.

## 2020-02-21 NOTE — ED Notes (Signed)
This RN went into room to discharge pt. Pts oxygen level noted to be 55% on room air. No non-rebreather available. RN placed pt on Nasal canula at 15lpm. Nurse called ER charge nurse to come assist and bring a provider. Oxygen level on pt came up to 95% and oxygen taken to 6LPM via the nasal canula.  ED charge nurse came to room. Pt is answering questions inappropriately and acutely altered. ED charge nurse turned off oxygen and pts oxygen dropped to the mid 80s and 6lpm via nasal canula restarted on pt. Pt taken to ER room 10 as per charge nurse. Report given to Dr. Owens Shark and Suzanne Boron, RN. Narcan given as per Dr. Owens Shark, please see MAR. Hand off of care given at this time.

## 2020-02-21 NOTE — ED Provider Notes (Signed)
12:45 AM I was notified by Ferdinand Lango RN to present to room 10 to evaluate a patient I was being discharged from another section in the emergency department.  On my arrival to the room the patient with confusion and hypoxia.  Ferdinand Lango RN notified me that the patient was evaluated in our urgent care he diagnosed with lower extremity cellulitis.  He stated that when he went to the patient room to discharge the patient oxygen saturation noted to be 55% on room air at which point 6 L of oxygen via nasal cannula was applied which improved the patient's oxygen saturation 95%.  He also stated the patient was noted to have altered mental status.  He states that the patient did receive 4 mg of IV morphine for approximately 40 minutes before he went in to discharge the patient.  He did inform me however that the patient has been tachypneic during the period of time that he was taking care of the patient.  On my arrival to the room the patient is tachypneic and hypoxic with altered mental status.  Patient was given 1 mg of IV Narcan with improvement of mental status however patient remains tachypneic upon hypoxia is persisted.  Chart review revealed that the patient has a white blood cell count of 19.3 both patient and husband state that she has not had any steroid use including prednisone.   Physical exam: Constitutional: Alert but with periods of confusion. Head: Atraumatic Mouth: Markedly dry oral mucosa Respiratory: Tachypnea, diffuse rhonchi worse on the left Cardiac: Normal rate, rhythm, good peripheral circulation Gastrointestinal: Soft nontender nondistended no guarding or rebound  Concern for SIRS versus sepsis is etiology for the patient's symptoms and as such additional laboratory data was obtained including blood cultures and urinalysis.  Urinalysis revealed no evidence of urinary tract infection.  Concern for possible pneumonia as such chest x-ray was performed which revealed streaky bilateral lung opacities  with possible lung consolidation in the left lung.  As such patient will be given azithromycin 500 mg IV as the patient was given Ancef while in urgent care area.  Despite receiving 205 mL boluses of normal saline patient's blood pressure did decrease to 89/47 at 2:10 AM.  As such an additional liter of IV normal saline was administered sepsis protocol will be initiated.  Patient will be admitted to the hospital staff for further evaluation and management.  However hospitalist requested that the patient be admitted to the ICU staff.  Patient subsequently discussed with the ICU staff.  ICU staff requested that the patient be admitted to the hospitalist as she is responding to IV fluids.  Patiently subsequently discussed with the hospitalist staff who admitted the patient.  .Critical Care Performed by: Gregor Hams, MD Authorized by: Gregor Hams, MD   Critical care provider statement:    Critical care time (minutes):  60   Critical care time was exclusive of:  Separately billable procedures and treating other patients   Critical care was necessary to treat or prevent imminent or life-threatening deterioration of the following conditions:  Sepsis   Critical care was time spent personally by me on the following activities:  Development of treatment plan with patient or surrogate, discussions with consultants, evaluation of patient's response to treatment, examination of patient, obtaining history from patient or surrogate, ordering and performing treatments and interventions, ordering and review of laboratory studies, ordering and review of radiographic studies, pulse oximetry, re-evaluation of patient's condition and review of old charts  Diagnosis Sepsis     Gregor Hams, MD 02/21/20 [id number]

## 2020-02-21 NOTE — Progress Notes (Signed)
*  PRELIMINARY RESULTS* Echocardiogram 2D Echocardiogram has been performed.  Madison Mcbride Madison Mcbride 02/21/2020, 7:11 PM

## 2020-02-21 NOTE — Progress Notes (Signed)
Pharmacy Antibiotic Note  Madison Mcbride is a 64 y.o. female admitted on 02/20/2020 with bilateral LE cellulitis as well as hypoxia w/ lower lung consolidation on CXR meeting 2/4 SIRS and 3/3 qSOFA.  Pharmacy has been consulted for vancomycin dosing.  Plan: Will give patient vancomycin 2g IV load and dose per levels for now given that patient is in AKI w/ baseline Scr of 1.09 - 1.16, current Scr is 1.57 mg/dL. Will plan for random level in 24 hours post dose and continue to monitor renal function and adjust doses/regimen as necessary.  Goal random < 20 mcg/mL.  Height: 5\' 5"  (165.1 cm) Weight: (!) 145.2 kg (320 lb) IBW/kg (Calculated) : 57  Temp (24hrs), Avg:99.2 F (37.3 C), Min:99.2 F (37.3 C), Max:99.2 F (37.3 C)  Recent Labs  Lab 02/20/20 0230 02/20/20 2128 02/21/20 0413  WBC  --  19.3*  --   CREATININE  --  1.57*  --   LATICACIDVEN 1.5 1.3 1.4    Estimated Creatinine Clearance: 52.1 mL/min (A) (by C-G formula based on SCr of 1.57 mg/dL (H)).    No Known Allergies   Thank you for allowing pharmacy to be a part of this patient's care.  Tobie Lords, PharmD, BCPS Clinical Pharmacist 02/21/2020 7:23 AM

## 2020-02-21 NOTE — ED Notes (Signed)
ED TO INPATIENT HANDOFF REPORT  ED Nurse Name and Phone #: [phone]  S Name/Age/Gender Madison Mcbride 64 y.o. female Room/Bed: ED10A/ED10A  Code Status   Code Status: Full Code  Home/SNF/Other Home Patient oriented to: self, place, time and situation Is this baseline? Yes   Triage Complete: Triage complete  Chief Complaint Severe sepsis ([place]) [A41.9, R65.20] Sepsis ([place]) [A41.9]  Triage Note Pt to triage via recliner for pain in feet & ankles for several wks with no known injury, esp with any wt bearing; pt denies any other c/o at this time    Allergies No Known Allergies  Level of Care/Admitting Diagnosis ED Disposition    ED Disposition Condition [facility] Area: [place] [[id number]]  Level of Care: Med-Surg [16]  Covid Evaluation: Confirmed COVID Negative  Diagnosis: Sepsis [facility]) [[id number]]  Admitting Physician: Odessa Fleming  Attending Physician: Odessa Fleming  Estimated length of stay: past midnight tomorrow  Certification:: I certify this patient will need inpatient services for at least 2 midnights       B Medical/Surgery History Past Medical History:  Diagnosis Date  . Diabetes mellitus without complication ([place])   . Hypothyroidism   . RLS (restless legs syndrome)   . Thyroid cancer ([place]) 2007   Past Surgical History:  Procedure Laterality Date  . ABDOMINAL HYSTERECTOMY    . COLONOSCOPY WITH PROPOFOL N/A 08/26/2015   Procedure: COLONOSCOPY WITH PROPOFOL;  Surgeon: Manya Silvas, MD;  Location: [facility] ENDOSCOPY;  Service: Endoscopy;  Laterality: N/A;  . THYROIDECTOMY       A IV Location/Drains/Wounds Patient Lines/Drains/Airways Status    Active Line/Drains/Airways    Name Placement date Placement time Site Days   Peripheral IV 02/20/20 Left Forearm 02/20/20  2129  Forearm  1   Peripheral IV 02/21/20 Left;Posterior Forearm 02/21/20  0829  Forearm  less than 1   Urethral Catheter Dee, RN  Straight-tip 16 Fr. 02/21/20  1030  Straight-tip  less than 1          Intake/Output Last 24 hours  Intake/Output Summary (Last 24 hours) at 02/21/2020 1459 Last data filed at 02/21/2020 1031 Gross per 24 hour  Intake 2110 ml  Output 450 ml  Net 1660 ml    Labs/Imaging Results for orders placed or performed during the hospital encounter of 02/20/20 (from the past 48 hour(s))  Lactic acid, plasma     Status: None   Collection Time: 02/20/20  2:30 AM  Result Value Ref Range   Lactic Acid, Venous 1.5 0.5 - 1.9 mmol/L    Comment: Performed at [facility], [address] [place], [place] [address]  Comprehensive metabolic panel     Status: Abnormal   Collection Time: 02/20/20  9:28 PM  Result Value Ref Range   Sodium 137 135 - 145 mmol/L   Potassium 4.5 3.5 - 5.1 mmol/L   Chloride 95 (L) 98 - 111 mmol/L   CO2 29 22 - 32 mmol/L   Glucose, Bld 115 (H) 70 - 99 mg/dL    Comment: Glucose reference range applies only to samples taken after fasting for at least 8 hours.   BUN 48 (H) 8 - 23 mg/dL   Creatinine, Ser 1.57 (H) 0.44 - 1.00 mg/dL   Calcium 8.9 8.9 - 10.3 mg/dL   Total Protein 7.8 6.5 - 8.1 g/dL   Albumin 2.6 (L) 3.5 - 5.0 g/dL   AST 47 (H) 15 - 41 U/L  ALT 26 0 - 44 U/L   Alkaline Phosphatase 119 38 - 126 U/L   Total Bilirubin 1.8 (H) 0.3 - 1.2 mg/dL   GFR calc non Af Amer 34 (L) >60 mL/min   GFR calc Af Amer 40 (L) >60 mL/min   Anion gap 13 5 - 15    Comment: Performed at [facility], [address] [place], [place] [address]  Lactic acid, plasma     Status: None   Collection Time: 02/20/20  9:28 PM  Result Value Ref Range   Lactic Acid, Venous 1.3 0.5 - 1.9 mmol/L    Comment: Performed at [facility], [address] [place], [place] [address]  CBC with Differential     Status: Abnormal   Collection Time: 02/20/20  9:28 PM  Result Value Ref Range   WBC 19.3 (H) 4.0 - 10.5 K/uL   RBC 4.00 3.87 - 5.11 MIL/uL   Hemoglobin 11.0 (L)  12.0 - 15.0 g/dL   HCT 36.6 36 - 46 %   MCV 91.5 80.0 - 100.0 fL   MCH 27.5 26.0 - 34.0 pg   MCHC 30.1 30.0 - 36.0 g/dL   RDW 17.8 (H) 11.5 - 15.5 %   Platelets 407 (H) 150 - 400 K/uL   nRBC 0.0 0.0 - 0.2 %   Neutrophils Relative % 84 %   Neutro Abs 16.3 (H) 1.7 - 7.7 K/uL   Lymphocytes Relative 6 %   Lymphs Abs 1.2 0.7 - 4.0 K/uL   Monocytes Relative 8 %   Monocytes Absolute 1.5 (H) 0 - 1 K/uL   Eosinophils Relative 1 %   Eosinophils Absolute 0.2 0 - 0 K/uL   Basophils Relative 0 %   Basophils Absolute 0.1 0 - 0 K/uL   Immature Granulocytes 1 %   Abs Immature Granulocytes 0.10 (H) 0.00 - 0.07 K/uL    Comment: Performed at [facility], [address] [place], [place] [address]  Brain natriuretic peptide     Status: Abnormal   Collection Time: 02/20/20  9:28 PM  Result Value Ref Range   B Natriuretic Peptide 180.5 (H) 0.0 - 100.0 pg/mL    Comment: Performed at [facility], [address] [place], [place] [address]  Glucose, capillary     Status: Abnormal   Collection Time: 02/21/20 12:47 AM  Result Value Ref Range   Glucose-Capillary 134 (H) 70 - 99 mg/dL    Comment: Glucose reference range applies only to samples taken after fasting for at least 8 hours.  Blood culture (routine x 2)     Status: None (Preliminary result)   Collection Time: 02/21/20  2:30 AM   Specimen: BLOOD  Result Value Ref Range   Specimen Description BLOOD LEFT FA    Special Requests      BOTTLES DRAWN AEROBIC AND ANAEROBIC Blood Culture adequate volume   Culture      NO GROWTH < 12 HOURS Performed at [facility], [address] [place], [place] [address]    Report Status PENDING   Blood culture (routine x 2)     Status: None (Preliminary result)   Collection Time: 02/21/20  2:30 AM   Specimen: BLOOD  Result Value Ref Range   Specimen Description BLOOD RIGHT FA    Special Requests      BOTTLES DRAWN AEROBIC AND ANAEROBIC Blood Culture adequate volume   Culture       NO GROWTH < 12 HOURS Performed at [facility], [address] [place], [place] [address]    Report Status PENDING   SARS Coronavirus 2 by RT PCR (hospital order, performed in [facility] hospital lab) Nasopharyngeal Nasopharyngeal Swab     Status: None   Collection Time: 02/21/20  2:30 AM   Specimen: Nasopharyngeal Swab  Result Value Ref Range   SARS Coronavirus 2 NEGATIVE NEGATIVE    Comment: (NOTE) SARS-CoV-2 target nucleic acids are NOT DETECTED.  The SARS-CoV-2 RNA is generally detectable in upper and lower respiratory specimens during the acute phase of infection. The lowest concentration of SARS-CoV-2 viral copies this assay can detect is 250 copies / mL. A negative result does not preclude SARS-CoV-2 infection and should not be used as the sole basis for treatment or other patient management decisions.  A negative result may occur with improper specimen collection / handling, submission of specimen other than nasopharyngeal swab, presence of viral mutation(s) within the areas targeted by this assay, and inadequate number of viral copies (<250 copies / mL). A negative result must be combined with clinical observations, patient history, and epidemiological information.  Fact Sheet for Patients:   StrictlyIdeas.no  Fact Sheet for Healthcare Providers: BankingDealers.co.za  This test is not yet approved or  cleared by the [place] FDA and has been authorized for detection and/or diagnosis of SARS-CoV-2 by FDA under an Emergency Use Authorization (EUA).  This EUA will remain in effect (meaning this test can be used) for the duration of the COVID-19 declaration under Section 564(b)(1) of the Act, 21 U.S.C. section 360bbb-3(b)(1), unless the authorization is terminated or revoked sooner.  Performed at [facility], [address] [place], [place] [address]   Urinalysis, Complete w Microscopic Urine,  Catheterized     Status: Abnormal   Collection Time: 02/21/20  3:42 AM  Result Value Ref Range   Color, Urine YELLOW (A) YELLOW   APPearance HAZY (A) CLEAR   Specific Gravity, Urine 1.010 1.005 - 1.030   pH 6.0 5.0 - 8.0   Glucose, UA NEGATIVE NEGATIVE mg/dL   Hgb urine dipstick NEGATIVE NEGATIVE   Bilirubin Urine NEGATIVE NEGATIVE   Ketones, ur NEGATIVE NEGATIVE mg/dL   Protein, ur NEGATIVE NEGATIVE mg/dL   Nitrite NEGATIVE NEGATIVE   Leukocytes,Ua NEGATIVE NEGATIVE   RBC / HPF 0-5 0 - 5 RBC/hpf   WBC, UA 0-5 0 - 5 WBC/hpf   Bacteria, UA NONE SEEN NONE SEEN   Squamous Epithelial / LPF 0-5 0 - 5   Mucus PRESENT     Comment: Performed at [facility], [address] [place], [place] [address]  Lactic acid, plasma     Status: None   Collection Time: 02/21/20  4:13 AM  Result Value Ref Range   Lactic Acid, Venous 1.4 0.5 - 1.9 mmol/L    Comment: Performed at [facility], [address] [place],  [address]  Blood gas, arterial     Status: Abnormal   Collection Time: 02/21/20  6:56 AM  Result Value Ref Range   FIO2 0.32    Delivery systems NASAL CANNULA    pH, Arterial 7.32 (L) 7.35 - 7.45   pCO2 arterial 58 (H) 32 - 48 mmHg   pO2, Arterial 82 (L) 83 - 108 mmHg   Bicarbonate 29.9 (H) 20.0 - 28.0 mmol/L   Acid-Base Excess 2.9 (H) 0.0 - 2.0 mmol/L   O2 Saturation 95.0 %   Patient temperature 37.0    Collection site LEFT RADIAL  Sample type ARTERIAL DRAW    Allens test (pass/fail) PASS PASS    Comment: Performed at [facility], [address] [place], [place] [address]  Glucose, capillary     Status: Abnormal   Collection Time: 02/21/20  7:28 AM  Result Value Ref Range   Glucose-Capillary 115 (H) 70 - 99 mg/dL    Comment: Glucose reference range applies only to samples taken after fasting for at least 8 hours.  Procalcitonin - Baseline     Status: None   Collection Time: 02/21/20  9:46 AM  Result Value Ref Range   Procalcitonin 2.09  ng/mL    Comment:        Interpretation: PCT > 2 ng/mL: Systemic infection (sepsis) is likely, unless other causes are known. (NOTE)       Sepsis PCT Algorithm           Lower Respiratory Tract                                      Infection PCT Algorithm    ----------------------------     ----------------------------         PCT < 0.25 ng/mL                PCT < 0.10 ng/mL          Strongly encourage             Strongly discourage   discontinuation of antibiotics    initiation of antibiotics    ----------------------------     -----------------------------       PCT 0.25 - 0.50 ng/mL            PCT 0.10 - 0.25 ng/mL               OR       >80% decrease in PCT            Discourage initiation of                                            antibiotics      Encourage discontinuation           of antibiotics    ----------------------------     -----------------------------         PCT >= 0.50 ng/mL              PCT 0.26 - 0.50 ng/mL               AND       <80% decrease in PCT              Encourage initiation of                                             antibiotics       Encourage continuation           of antibiotics    ----------------------------     -----------------------------        PCT >= 0.50 ng/mL                  PCT > 0.50 ng/mL  AND         increase in PCT                  Strongly encourage                                      initiation of antibiotics    Strongly encourage escalation           of antibiotics                                     -----------------------------                                           PCT <= 0.25 ng/mL                                                 OR                                        > 80% decrease in PCT                                      Discontinue / Do not initiate                                             antibiotics  Performed at [facility], [address] [place], [place] [address]    CBC     Status: Abnormal   Collection Time: 02/21/20  9:49 AM  Result Value Ref Range   WBC 20.8 (H) 4.0 - 10.5 K/uL   RBC 3.31 (L) 3.87 - 5.11 MIL/uL   Hemoglobin 9.1 (L) 12.0 - 15.0 g/dL   HCT 31.7 (L) 36 - 46 %   MCV 95.8 80.0 - 100.0 fL   MCH 27.5 26.0 - 34.0 pg   MCHC 28.7 (L) 30.0 - 36.0 g/dL   RDW 17.9 (H) 11.5 - 15.5 %   Platelets 397 150 - 400 K/uL   nRBC 0.0 0.0 - 0.2 %    Comment: Performed at [facility], [address] [place], [place] [address]  Creatinine, serum     Status: Abnormal   Collection Time: 02/21/20  9:49 AM  Result Value Ref Range   Creatinine, Ser 2.33 (H) 0.44 - 1.00 mg/dL   GFR calc non Af Amer 21 (L) >60 mL/min   GFR calc Af Amer 25 (L) >60 mL/min    Comment: Performed at [facility], [address] [place],  [address]  Glucose, capillary     Status: Abnormal   Collection Time: 02/21/20 12:08 PM  Result Value Ref Range   Glucose-Capillary 137 (H) 70 - 99 mg/dL    Comment: Glucose reference range applies only to samples  taken after fasting for at least 8 hours.   US Venous Img Lower Bilateral  Result Date: 02/20/2020 EXAM: BILATERAL LOWER EXTREMITY VENOUS DOPPLER ULTRASOUND TECHNIQUE: Gray-scale sonography with graded compression, as well as color Doppler and duplex ultrasound were performed to evaluate the lower extremity deep venous systems from the level of the common femoral vein and including the common femoral, femoral, profunda femoral, popliteal and calf veins including the posterior tibial, peroneal and gastrocnemius veins when visible. The superficial great saphenous vein was also interrogated. Spectral Doppler was utilized to evaluate flow at rest and with distal augmentation maneuvers in the common femoral, femoral and popliteal veins. COMPARISON:  None. FINDINGS: RIGHT LOWER EXTREMITY Common Femoral Vein: No evidence of thrombus. Normal compressibility, respiratory phasicity and response to augmentation.  Saphenofemoral Junction: No evidence of thrombus. Normal compressibility and flow on color Doppler imaging. Profunda Femoral Vein: No evidence of thrombus. Normal compressibility and flow on color Doppler imaging. Femoral Vein: No evidence of thrombus. Normal compressibility, respiratory phasicity and response to augmentation. Popliteal Vein: No evidence of thrombus. Normal compressibility, respiratory phasicity and response to augmentation. Calf Veins: No evidence of thrombus. Normal compressibility and flow on color Doppler imaging. Peroneal vein not visualized. Superficial Great Saphenous Vein: No evidence of thrombus. Normal compressibility. Venous Reflux:  None. Other Findings: Mildly complex hypoechoic collection at the right popliteal fossa of measures 7.2 x 1.6 x 2.4 cm, consistent with a Baker cyst. Mild diffuse soft tissue/interstitial edema noted within the soft tissues of the lower leg/calf. LEFT LOWER EXTREMITY Common Femoral Vein: No evidence of thrombus. Normal compressibility, respiratory phasicity and response to augmentation. Saphenofemoral Junction: No evidence of thrombus. Normal compressibility and flow on color Doppler imaging. Profunda Femoral Vein: No evidence of thrombus. Normal compressibility and flow on color Doppler imaging. Femoral Vein: No evidence of thrombus. Normal compressibility, respiratory phasicity and response to augmentation. Popliteal Vein: No evidence of thrombus. Normal compressibility, respiratory phasicity and response to augmentation. Calf Veins: No evidence of thrombus. Normal compressibility and flow on color Doppler imaging. Peroneal vein not visualized. Superficial Great Saphenous Vein: No evidence of thrombus. Normal compressibility. Venous Reflux:  None. Other Findings: Hypoechoic collection at the left popliteal fossa measures 1.7 x 0.6 x 1.8 cm, suggesting a small Baker cyst. Mild diffuse soft tissue/interstitial edema noted within the soft tissues of the lower  leg/calf. IMPRESSION: 1. No evidence of deep venous thrombosis in either lower extremity. 2. Mild diffuse soft tissue/interstitial edema within the lower legs/calves bilaterally. 3. Bilateral Baker's cysts as detailed above, right larger than left. Electronically Signed   By: Jeannine Boga M.D.   On: 02/20/2020 21:23   DG Chest Portable 1 View  Result Date: 02/21/2020 CLINICAL DATA:  64 year old female with shortness of breath, severe hypoxia. COVID-19. Status pending. EXAM: PORTABLE CHEST 1 VIEW COMPARISON:  Portable chest 01/25/2014. FINDINGS: Portable AP upright view at 0122 hours. Lower lung volumes. Mild cardiomegaly. Other mediastinal contours are within normal limits. Visualized tracheal air column is within normal limits. Streaky bilateral perihilar opacity. More confluent left lung base opacity. No superimposed pneumothorax. No definite pleural effusions. No acute osseous abnormality identified. IMPRESSION: Low lung volumes with streaky bilateral lung opacity, possible left lung base consolidation. The appearance is nonspecific but consider pneumonia, including viral/atypical etiologies. Aspiration is also in the differential. Electronically Signed   By: Genevie Ann M.D.   On: 02/21/2020 01:34    Pending Labs Unresulted Labs (From admission, onward) Comment  Start     Ordered   02/28/20 0500  Creatinine, serum  (enoxaparin (LOVENOX)    CrCl >/= 30 ml/min)  Weekly,   STAT     Comments: while on enoxaparin therapy    02/21/20 0600   02/22/20 1200  Vancomycin, random  Once-Timed,   STAT        02/21/20 1221   02/22/20 0500  Protime-INR  Tomorrow morning,   STAT        02/21/20 0600   02/22/20 0500  Procalcitonin  Tomorrow morning,   STAT        02/21/20 0600   02/22/20 0500  Procalcitonin  Daily,   STAT      02/21/20 0655   02/22/20 0865  Basic metabolic panel  Tomorrow morning,   STAT        02/21/20 0731   02/22/20 0500  CBC  Tomorrow morning,   STAT        02/21/20 1450    02/21/20 1433  MRSA PCR Screening  Once,   STAT        02/21/20 1432   02/21/20 0603  Hemoglobin A1c  Once,   STAT       Comments: To assess prior glycemic control    02/21/20 0602   02/21/20 0558  HIV Antibody (routine testing w rflx)  (HIV Antibody (Routine testing w reflex) panel)  Once,   STAT        02/21/20 0600   02/21/20 0432  Culture, blood (single)  (Undifferentiated -> Now sepsis confirmed (treatment and sepsis specific nursing orders))  ONCE - STAT,   STAT        02/21/20 0432          Vitals/Pain Today's Vitals   02/21/20 0730 02/21/20 0940 02/21/20 0950 02/21/20 1020  BP: 92/68 (!) 88/28 (!) 106/37 98/62  Pulse: 62 63 (!) 59 65  Resp: (!) 22 17 (!) 24 (!) 26  Temp:      TempSrc:      SpO2: 99% 97% 97% 94%  Weight:      Height:      PainSc:        Isolation Precautions No active isolations  Medications Medications  lactated ringers bolus 1,000 mL (0 mLs Intravenous Stopped 02/21/20 0739)    And  lactated ringers bolus 1,000 mL (0 mLs Intravenous Stopped 02/21/20 0739)    And  lactated ringers bolus 1,000 mL (1,000 mLs Intravenous Not Given 02/21/20 0923)    And  lactated ringers bolus 1,000 mL (0 mLs Intravenous Stopped 02/21/20 0943)    And  lactated ringers bolus 500 mL (500 mLs Intravenous Not Given 02/21/20 0924)  enoxaparin (LOVENOX) injection 40 mg (40 mg Subcutaneous Given 02/21/20 0913)  0.9 %  sodium chloride infusion ( Intravenous New Bag/Given 02/21/20 0959)  cefTRIAXone (ROCEPHIN) 2 g in sodium chloride 0.9 % 100 mL IVPB (2 g Intravenous Not Given 02/21/20 0619)  azithromycin (ZITHROMAX) 500 mg in sodium chloride 0.9 % 250 mL IVPB (0 mg Intravenous Stopped 02/21/20 1336)  acetaminophen (TYLENOL) tablet 650 mg (has no administration in time range)    Or  acetaminophen (TYLENOL) suppository 650 mg (has no administration in time range)  ondansetron (ZOFRAN) tablet 4 mg (has no administration in time range)    Or  ondansetron (ZOFRAN) injection 4 mg  (has no administration in time range)  insulin aspart (novoLOG) injection 0-5 Units (has no administration in time range)  insulin aspart (novoLOG) injection 0-20 Units (  3 Units Subcutaneous Given 02/21/20 1220)  budesonide (PULMICORT) nebulizer solution 0.5 mg (0.5 mg Nebulization Given 02/21/20 0911)  ipratropium-albuterol (DUONEB) 0.5-2.5 (3) MG/3ML nebulizer solution 3 mL (3 mLs Nebulization Not Given 02/21/20 0912)  ipratropium-albuterol (DUONEB) 0.5-2.5 (3) MG/3ML nebulizer solution 3 mL (3 mLs Nebulization Given 02/21/20 0911)  0.9 %  sodium chloride infusion (0 mLs Intravenous Hold 02/21/20 0923)  atorvastatin (LIPITOR) tablet 20 mg (has no administration in time range)  Biotin CAPS 2.5 mg (has no administration in time range)  buPROPion (WELLBUTRIN XL) 24 hr tablet 150 mg (has no administration in time range)  diclofenac Sodium (VOLTAREN) 1 % topical gel 2 g (has no administration in time range)  lidocaine (LIDODERM) 5 % 1 patch (has no administration in time range)  multivitamin tablet 1 tablet (has no administration in time range)  levothyroxine (SYNTHROID) tablet 250 mcg (has no administration in time range)  morphine 4 MG/ML injection 4 mg (4 mg Intravenous Given 02/20/20 2330)  ceFAZolin (ANCEF) IVPB 1 g/50 mL premix (0 g Intravenous Stopped 02/21/20 0019)  ketorolac (TORADOL) 30 MG/ML injection 15 mg (15 mg Intravenous Given 02/20/20 2334)  gabapentin (NEURONTIN) capsule 300 mg (300 mg Oral Given 02/20/20 2328)  ondansetron (ZOFRAN) injection 4 mg (4 mg Intravenous Given 02/20/20 2330)  naloxone (NARCAN) injection 1 mg (1 mg Intravenous Given 02/21/20 0038)  sodium chloride 0.9 % bolus 500 mL (0 mLs Intravenous Stopped 02/21/20 0231)  sodium chloride 0.9 % bolus 500 mL (0 mLs Intravenous Stopped 02/21/20 0412)  sodium chloride 0.9 % bolus 1,000 mL (0 mLs Intravenous Stopped 02/21/20 0542)  azithromycin (ZITHROMAX) 500 mg in sodium chloride 0.9 % 250 mL IVPB (0 mg Intravenous Stopped 02/21/20  0542)  vancomycin (VANCOREADY) IVPB 2000 mg/400 mL (0 mg Intravenous Stopped 02/21/20 1416)    Mobility walks with person assist Low fall risk   Focused Assessments    R Recommendations: See Admitting Provider Note  Report given to:

## 2020-02-21 NOTE — Progress Notes (Signed)
VAST consulted to obtain 2nd IV access for sepsis protocol. Bilateral arms assessed utilizing [place]. Pt with extremely small and limited vasculature. No vessels noted appropriate for midline.  Successful in obtaining an IV, however, catheter not seated completely and blood return not enough to collect labs as requested.  If further IV access is needed, likely to need central line.

## 2020-02-22 DIAGNOSIS — J9601 Acute respiratory failure with hypoxia: Secondary | ICD-10-CM

## 2020-02-22 LAB — ECHOCARDIOGRAM COMPLETE
AR max vel: 1.47 cm2
AV Peak grad: 9.6 mmHg
Ao pk vel: 1.55 m/s
Area-P 1/2: 4.15 cm2
Height: 65 in
S' Lateral: 4.05 cm
Weight: 5120 oz

## 2020-02-22 LAB — BASIC METABOLIC PANEL
Anion gap: 13 (ref 5–15)
BUN: 61 mg/dL — ABNORMAL HIGH (ref 8–23)
CO2: 26 mmol/L (ref 22–32)
Calcium: 8.1 mg/dL — ABNORMAL LOW (ref 8.9–10.3)
Chloride: 100 mmol/L (ref 98–111)
Creatinine, Ser: 2.35 mg/dL — ABNORMAL HIGH (ref 0.44–1.00)
GFR calc Af Amer: 24 mL/min — ABNORMAL LOW (ref >60)
GFR calc non Af Amer: 21 mL/min — ABNORMAL LOW (ref >60)
Glucose, Bld: 159 mg/dL — ABNORMAL HIGH (ref 70–99)
Potassium: 4.6 mmol/L (ref 3.5–5.1)
Sodium: 139 mmol/L (ref 135–145)

## 2020-02-22 LAB — CBC
HCT: 33.3 % — ABNORMAL LOW (ref 36.0–46.0)
Hemoglobin: 10.3 g/dL — ABNORMAL LOW (ref 12.0–15.0)
MCH: 28.2 pg (ref 26.0–34.0)
MCHC: 30.9 g/dL (ref 30.0–36.0)
MCV: 91.2 fL (ref 80.0–100.0)
Platelets: 395 10*3/uL (ref 150–400)
RBC: 3.65 MIL/uL — ABNORMAL LOW (ref 3.87–5.11)
RDW: 17.3 % — ABNORMAL HIGH (ref 11.5–15.5)
WBC: 19.1 10*3/uL — ABNORMAL HIGH (ref 4.0–10.5)
nRBC: 0 % (ref 0.0–0.2)

## 2020-02-22 LAB — PROCALCITONIN: Procalcitonin: 2.27 ng/mL

## 2020-02-22 LAB — GLUCOSE, CAPILLARY
Glucose-Capillary: 167 mg/dL — ABNORMAL HIGH (ref 70–99)
Glucose-Capillary: 146 mg/dL — ABNORMAL HIGH (ref 70–99)
Glucose-Capillary: 166 mg/dL — ABNORMAL HIGH (ref 70–99)
Glucose-Capillary: 159 mg/dL — ABNORMAL HIGH (ref 70–99)

## 2020-02-22 LAB — VANCOMYCIN, RANDOM: Vancomycin Rm: 12

## 2020-02-22 LAB — PROTIME-INR
INR: 1.2 (ref 0.8–1.2)
Prothrombin Time: 14.9 seconds (ref 11.4–15.2)

## 2020-02-22 LAB — HEMOGLOBIN A1C
Hgb A1c MFr Bld: 6.5 % — ABNORMAL HIGH (ref 4.8–5.6)
Mean Plasma Glucose: 140 mg/dL

## 2020-02-22 MED ORDER — NOREPINEPHRINE 4 MG/250ML-% IV SOLN
2.0000 ug/min | INTRAVENOUS | Status: DC
  Administered 2020-02-22: 2 ug/min via INTRAVENOUS
  Filled 2020-02-22: qty 250

## 2020-02-22 MED ORDER — SODIUM CHLORIDE 0.45 % IV SOLN: INTRAVENOUS | Status: AC

## 2020-02-22 MED ORDER — SODIUM CHLORIDE 0.9 % IV SOLN
250.0000 mL | INTRAVENOUS | Status: DC
  Administered 2020-02-22: 250 mL via INTRAVENOUS

## 2020-02-22 MED ORDER — ALBUMIN HUMAN 5 % IV SOLN
25.0000 g | Freq: Once | INTRAVENOUS | Status: AC
  Administered 2020-02-22: 25 g via INTRAVENOUS
  Filled 2020-02-22: qty 500

## 2020-02-22 NOTE — ED Notes (Signed)
Messaged Admit Patel and informed of pt's left hand swelling and current output

## 2020-02-22 NOTE — ED Notes (Signed)
Pt given water 

## 2020-02-22 NOTE — ED Notes (Signed)
Pt given peanut butter and graham crackers

## 2020-02-22 NOTE — ED Notes (Signed)
Pt given supper tray.

## 2020-02-22 NOTE — Progress Notes (Signed)
[facility] [place] NAME: Madison Mcbride    MR#:  [id number]  DATE OF BIRTH:  1956-06-03  SUBJECTIVE:   patient much awake alert then yday.  Now  Down to 2 mcg of IV norepinephrine complains of bilateral lower extremity pain. Denies any cough or fever at home Left wrist tighness--Puffy around IV site  REVIEW OF SYSTEMS:   Review of Systems  Constitutional: Negative for chills, fever and weight loss.  HENT: Negative for ear discharge, ear pain and nosebleeds.   Eyes: Negative for blurred vision, pain and discharge.  Respiratory: Negative for sputum production, shortness of breath, wheezing and stridor.   Cardiovascular: Negative for chest pain, palpitations, orthopnea and PND.  Gastrointestinal: Negative for abdominal pain, diarrhea, nausea and vomiting.  Genitourinary: Negative for frequency and urgency.  Musculoskeletal: Positive for back pain and joint pain.  Skin:       Redness both le  Neurological: Positive for weakness. Negative for sensory change, speech change and focal weakness.  Psychiatric/Behavioral: Negative for depression and hallucinations. The patient is not nervous/anxious.    Tolerating Diet:yes Tolerating PT: pending on IV pressor  DRUG ALLERGIES:  No Known Allergies  VITALS:  Blood pressure (!) 97/59, pulse 73, temperature 99.2 F (37.3 C), temperature source Oral, resp. rate 19, height 5\' 5"  (1.651 m), weight (!) 145.2 kg, SpO2 96 %.  PHYSICAL EXAMINATION:   Physical Exam  GENERAL:  64 y.o.-year-old patient lying in the bed with no acute distress. OBESE EYES: Pupils equal, round, reactive to light and accommodation. No scleral icterus.   HEENT: Head atraumatic, normocephalic. Oropharynx and nasopharynx clear.  NECK:  Supple, no jugular venous distention. No thyroid enlargement, no tenderness.  LUNGS: DECREASED breath sounds bilaterally, no wheezing, rales, rhonchi. No use of accessory muscles of  respiration.  CARDIOVASCULAR: S1, S2 normal. No murmurs, rubs, or gallops.  ABDOMEN: Soft, nontender, nondistended. Bowel sounds present. No organomegaly or mass.  EXTREMITIES: lef hand/wrist puffy with edema ?IV infiltration  Above on admssion 8/11- 8/12 appears stable   NEUROLOGIC: Cranial nerves II through XII are intact. No focal Motor or sensory deficits b/l.  Weak, deconditioned PSYCHIATRIC:  patient is alert and oriented x 3.  SKIN: AS above  LABORATORY PANEL:  CBC Recent Labs  Lab 02/22/20 0609  WBC 19.1*  HGB 10.3*  HCT 33.3*  PLT 395    Chemistries  Recent Labs  Lab 02/20/20 2128 02/21/20 0949 02/22/20 0609  NA 137  --  139  K 4.5  --  4.6  CL 95*  --  100  CO2 29  --  26  GLUCOSE 115*  --  159*  BUN 48*  --  61*  CREATININE 1.57*   < > 2.35*  CALCIUM 8.9  --  8.1*  AST 47*  --   --   ALT 26  --   --   ALKPHOS 119  --   --   BILITOT 1.8*  --   --    < > = values in this interval not displayed.   Cardiac Enzymes No results for input(s): TROPONINI in the last 168 hours. RADIOLOGY:  US RENAL  Result Date: 02/21/2020 CLINICAL DATA:  Acute kidney injury EXAM: RENAL / URINARY TRACT ULTRASOUND COMPLETE COMPARISON:  None. FINDINGS: Right Kidney: Renal measurements: 10.2 x 6.2 x 5.9 cm = volume: 192 mL. Small cyst in the upper pole measuring 13 mm. Normal echotexture. Mild cortical thinning. No hydronephrosis. Left Kidney:  Renal measurements: 10.2 x 5.8 x 6.4 cm = volume: 199 mL. Cortical thinning. Normal echotexture. No hydronephrosis. Small echogenic areas within the left kidney may reflect nonobstructing stones. Bladder: Appears normal for degree of bladder distention. Other: None. IMPRESSION: Mild cortical thinning.  No acute findings.  No hydronephrosis. Suspect left nephrolithiasis. Electronically Signed   By: Rolm Baptise M.D.   On: 02/21/2020 17:58   US Venous Img Lower Bilateral  Result Date: 02/20/2020 EXAM: BILATERAL LOWER EXTREMITY VENOUS DOPPLER  ULTRASOUND TECHNIQUE: Gray-scale sonography with graded compression, as well as color Doppler and duplex ultrasound were performed to evaluate the lower extremity deep venous systems from the level of the common femoral vein and including the common femoral, femoral, profunda femoral, popliteal and calf veins including the posterior tibial, peroneal and gastrocnemius veins when visible. The superficial great saphenous vein was also interrogated. Spectral Doppler was utilized to evaluate flow at rest and with distal augmentation maneuvers in the common femoral, femoral and popliteal veins. COMPARISON:  None. FINDINGS: RIGHT LOWER EXTREMITY Common Femoral Vein: No evidence of thrombus. Normal compressibility, respiratory phasicity and response to augmentation. Saphenofemoral Junction: No evidence of thrombus. Normal compressibility and flow on color Doppler imaging. Profunda Femoral Vein: No evidence of thrombus. Normal compressibility and flow on color Doppler imaging. Femoral Vein: No evidence of thrombus. Normal compressibility, respiratory phasicity and response to augmentation. Popliteal Vein: No evidence of thrombus. Normal compressibility, respiratory phasicity and response to augmentation. Calf Veins: No evidence of thrombus. Normal compressibility and flow on color Doppler imaging. Peroneal vein not visualized. Superficial Great Saphenous Vein: No evidence of thrombus. Normal compressibility. Venous Reflux:  None. Other Findings: Mildly complex hypoechoic collection at the right popliteal fossa of measures 7.2 x 1.6 x 2.4 cm, consistent with a Baker cyst. Mild diffuse soft tissue/interstitial edema noted within the soft tissues of the lower leg/calf. LEFT LOWER EXTREMITY Common Femoral Vein: No evidence of thrombus. Normal compressibility, respiratory phasicity and response to augmentation. Saphenofemoral Junction: No evidence of thrombus. Normal compressibility and flow on color Doppler imaging. Profunda  Femoral Vein: No evidence of thrombus. Normal compressibility and flow on color Doppler imaging. Femoral Vein: No evidence of thrombus. Normal compressibility, respiratory phasicity and response to augmentation. Popliteal Vein: No evidence of thrombus. Normal compressibility, respiratory phasicity and response to augmentation. Calf Veins: No evidence of thrombus. Normal compressibility and flow on color Doppler imaging. Peroneal vein not visualized. Superficial Great Saphenous Vein: No evidence of thrombus. Normal compressibility. Venous Reflux:  None. Other Findings: Hypoechoic collection at the left popliteal fossa measures 1.7 x 0.6 x 1.8 cm, suggesting a small Baker cyst. Mild diffuse soft tissue/interstitial edema noted within the soft tissues of the lower leg/calf. IMPRESSION: 1. No evidence of deep venous thrombosis in either lower extremity. 2. Mild diffuse soft tissue/interstitial edema within the lower legs/calves bilaterally. 3. Bilateral Baker's cysts as detailed above, right larger than left. Electronically Signed   By: Jeannine Boga M.D.   On: 02/20/2020 21:23   DG Chest Portable 1 View  Result Date: 02/21/2020 CLINICAL DATA:  64 year old female with shortness of breath, severe hypoxia. COVID-19. Status pending. EXAM: PORTABLE CHEST 1 VIEW COMPARISON:  Portable chest 01/25/2014. FINDINGS: Portable AP upright view at 0122 hours. Lower lung volumes. Mild cardiomegaly. Other mediastinal contours are within normal limits. Visualized tracheal air column is within normal limits. Streaky bilateral perihilar opacity. More confluent left lung base opacity. No superimposed pneumothorax. No definite pleural effusions. No acute osseous abnormality identified. IMPRESSION: Low lung volumes with  streaky bilateral lung opacity, possible left lung base consolidation. The appearance is nonspecific but consider pneumonia, including viral/atypical etiologies. Aspiration is also in the differential.  Electronically Signed   By: Genevie Ann M.D.   On: 02/21/2020 01:34   ECHOCARDIOGRAM COMPLETE  Result Date: 02/22/2020    ECHOCARDIOGRAM REPORT   Patient Name:   LAMANDA RUDDER Date of Exam: 02/21/2020 Medical Rec #:  [id number]             Height:       65.0 in Accession #:    [id number]            Weight:       320.0 lb Date of Birth:  1956/04/20             BSA:          2.415 m Patient Age:    23 years              BP:           95/49 mmHg Patient Gender: F                     HR:           62 bpm. Exam Location:  [facility] Procedure: 2D Echo Indications:     Abnormal ECG R94.31  History:         Patient has no prior history of Echocardiogram examinations.  Sonographer:     Arville Go RDCS Referring Phys:  Jordan Diagnosing Phys: Serafina Royals MD  Sonographer Comments: Technically challenging study due to limited acoustic windows, Technically difficult study due to poor echo windows and no subcostal window. Image acquisition challenging due to patient body habitus. IMPRESSIONS  1. Left ventricular ejection fraction, by estimation, is 50 to 55%. The left ventricle has low normal function. The left ventricle has no regional wall motion abnormalities. Left ventricular diastolic parameters were normal.  2. Right ventricular systolic function is normal. The right ventricular size is normal.  3. Left atrial size was mildly dilated.  4. The mitral valve is normal in structure. Mild to moderate mitral valve regurgitation.  5. The aortic valve is normal in structure. Aortic valve regurgitation is mild.  6. Aortic dilatation noted. 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 FINDINGS  Left Ventricle: Left ventricular ejection fraction, by estimation, is 50 to 55%. The left ventricle has low normal function. The left ventricle has no regional wall motion abnormalities. The left ventricular internal cavity size was normal in size. There is no left ventricular hypertrophy. Left ventricular diastolic  parameters were normal. Right Ventricle: The right ventricular size is normal. No increase in right ventricular wall thickness. Right ventricular systolic function is normal. Left Atrium: Left atrial size was mildly dilated. Right Atrium: Right atrial size was normal in size. Pericardium: There is no evidence of pericardial effusion. Mitral Valve: The mitral valve is normal in structure. Mild to moderate mitral valve regurgitation. Tricuspid Valve: The tricuspid valve is normal in structure. Tricuspid valve regurgitation is mild. Aortic Valve: The aortic valve is normal in structure. Aortic valve regurgitation is mild. Aortic valve peak gradient measures 9.6 mmHg. Pulmonic Valve: The pulmonic valve was normal in structure. Pulmonic valve regurgitation is not visualized. Aorta: Aortic dilatation noted. There is an aneurysm involving the aortic root and ascending aorta. IAS/Shunts: No atrial level shunt detected by color flow Doppler.  LEFT VENTRICLE PLAX 2D LVIDd:  5.81 cm  Diastology LVIDs:         4.05 cm  LV e' lateral:   6.53 cm/s LV PW:         1.20 cm  LV E/e' lateral: 12.8 LV IVS:        1.33 cm  LV e' medial:    5.44 cm/s LVOT diam:     2.10 cm  LV E/e' medial:  15.4 LV SV:         49 LV SV Index:   20 LVOT Area:     3.46 cm  RIGHT VENTRICLE RV Basal diam:  4.05 cm RV S prime:     12.80 cm/s TAPSE (M-mode): 2.3 cm LEFT ATRIUM           Index       RIGHT ATRIUM           Index LA diam:      2.70 cm 1.12 cm/m  RA Area:     18.20 cm LA Vol (A4C): 50.8 ml 21.03 ml/m RA Volume:   52.90 ml  21.90 ml/m  AORTIC VALVE                PULMONIC VALVE AV Area (Vmax): 1.47 cm    PV Vmax:       1.41 m/s AV Vmax:        155.00 cm/s PV Peak grad:  8.0 mmHg AV Peak Grad:   9.6 mmHg LVOT Vmax:      65.90 cm/s LVOT Vmean:     43.300 cm/s LVOT VTI:       0.141 m  AORTA Ao Root diam: 5.10 cm Ao Asc diam:  4.10 cm MITRAL VALVE               TRICUSPID VALVE MV Area (PHT): 4.15 cm    TV Peak grad:   34.9 mmHg MV Decel  Time: 183 msec    TV Vmax:        2.96 m/s MV E velocity: 83.60 cm/s MV A velocity: 75.80 cm/s  SHUNTS MV E/A ratio:  1.10        Systemic VTI:  0.14 m                            Systemic Diam: 2.10 cm Serafina Royals MD Electronically signed by Serafina Royals MD Signature Date/Time: 02/22/2020/8:58:54 AM    Final    ASSESSMENT AND PLAN:  64 year old female with history of HTN, hypothyroidism, diabetes, OSA who presents to the emergency room presenting initially with bilateral leg pain becoming unresponsive, hypotensive and hypoxic while in the ER Chest x-ray consistent with pneumonia, urinalysis unremarkable.  Severe sepsis ([place]) POA   CAP (community acquired pneumonia)   Bilateral cellulitis of lower leg -Borderline severe sepsis criteria with leukocytosis without fever or elevated lactic acid level but had multi organ derangement with hypotension, AKI, acute encephalopathy and hypoxia with pneumonia on chest x-ray and possible lower extremity cellulitis and required Norepinephrine for couple hours -sepsis improving -remains on IV Norepinephrine --IV solucortef 50 mg IV (started 8/11) --Continue IV hydration -cont IV Rocephin  And azithromycin  -Supplemental oxygen --procalcitonin 2.03 -UA negative -BC negative -MRSA pCR neg  Leucocytosis Came in wbc of 20K--19K -BC so far negative   Acute respiratory failure with hypoxia ([facility]CC) suspected due to pneumonia -Supplemental oxygen to keep sats over 92% -Patient had bilateral lower extremity Dopplers that were negative for acute DVT -  cont PRN bronchodilators and try to wean off oxygen when able to  Bilateral lower extremity pain -Lower extremity Doppler negative, suspect related to cellulitis and small baker cysts left knee  AKI (acute kidney injury) ([place]) appears ATN with sepsis -baseline creat 1.1 --came in with creat 1.57--IVF --2.33--2.35 -- nephrology consultation with Dr. Juleen China    Acute metabolic  encephalopathy--improved -Related to above -Fall and aspiration precautions -hold sedating meds    Type 2 diabetes mellitus without complication ([place]) -Sliding scale insulin coverage -A1c 7.4% On March 2021    Hypothyroidism -Continue home meds    Morbid obesity with BMI of 50.0-59.9, adult ([place]) - complicates overall prognosis and care    DVT prophylaxis: Lovenox  Code Status: full code  Family Communication:   husband on the phone Disposition Plan: Back to previous home environment Consults called: none  Status:At the time of admission, it appears that the appropriate admission status for this patient is INPATIENT. Patient is being treated for sepsis secondary to cellulitis and pneumonia she is acute renal failure as well  nephrology consultation placed patient remains on IV pressers   TOTAL TIME TAKING CARE OF THIS PATIENT: *30* minutes.  >50% time spent on counselling and coordination of care  Note: This dictation was prepared with Dragon dictation along with smaller phrase technology. Any transcriptional errors that result from this process are unintentional.  Fritzi Mandes M.D    [facility]   CC: Primary care physician; Rusty Aus, MDPatient ID: [name], female   DOB: 05-12-1956, 64 y.o.   MRN: [id number]

## 2020-02-22 NOTE — ED Notes (Signed)
Received call from [place] MD Posey Pronto to try and ween pt down from Levo. Pressures and Map have maintained since around 0100. Levo dropped down to 52mcg at this time BP-111/71. Pt denies any issues at this time.

## 2020-02-22 NOTE — ED Notes (Signed)
Pt has swelling noted to left hand. Pt states it feels tight.

## 2020-02-22 NOTE — ED Notes (Signed)
Pt moved over to hospital bed and cleaned up. Pt repositioned in bed and resting

## 2020-02-22 NOTE — ED Notes (Signed)
Pt given breakfast tray

## 2020-02-22 NOTE — Consult Note (Signed)
[facility]  CONSULT NOTE    Date: 02/22/2020                  Patient Name:  Madison Mcbride  MRN: [id number]  DOB: [birth year]  Age / Sex: 64 y.o., female         PCP: Rusty Aus, MD                 Service Requesting Consult: Dr. Posey Pronto                 Reason for Consult: Acute renal failure            History of Present Illness: Ms. Garnell Begeman is admitted to [facility] for pain in hre bilateral extremities. She was found to be negative for DVT and diagnosed with cellulitis. She was also found to be hypoxic and hypotensive. Placed on supplemental oxygen and requiring vasopressors.    Medications: Outpatient medications: (Not in a hospital admission)   Current medications: Current Facility-Administered Medications  Medication Dose Route Frequency Provider Last Rate Last Admin  . 0.45 % sodium chloride infusion   Intravenous Continuous Cassandria Santee, MD 75 mL/hr at 02/22/20 1008 New Bag at 02/22/20 1008  . 0.9 %  sodium chloride infusion   Intravenous Continuous Fritzi Mandes, MD   Stopped at 02/22/20 0700  . 0.9 %  sodium chloride infusion  250 mL Intravenous Continuous Awilda Bill, NP   Stopped at 02/22/20 0732  . acetaminophen (TYLENOL) tablet 650 mg  650 mg Oral Q6H PRN Athena Masse, MD   650 mg at 02/22/20 1016   Or  . acetaminophen (TYLENOL) suppository 650 mg  650 mg Rectal Q6H PRN Athena Masse, MD      . albumin human 5 % solution 25 g  25 g Intravenous Once Samaan, Maged, MD      . atorvastatin (LIPITOR) tablet 20 mg  20 mg Oral Q2200 Fritzi Mandes, MD   20 mg at 02/21/20 2224  . azithromycin (ZITHROMAX) 500 mg in sodium chloride 0.9 % 250 mL IVPB  500 mg Intravenous Q24H Athena Masse, MD   Stopped at 02/22/20 [phone]  . budesonide (PULMICORT) nebulizer solution 0.5 mg  0.5 mg Nebulization BID Awilda Bill, NP   0.5 mg at 02/22/20 0942  . cefTRIAXone (ROCEPHIN) 2 g in sodium chloride 0.9 % 100 mL IVPB  2 g Intravenous  Q24H Athena Masse, MD   Stopped at 02/22/20 [phone]  . diclofenac Sodium (VOLTAREN) 1 % topical gel 2 g  2 g Topical QID Eleonore Chiquito S, RPH      . enoxaparin (LOVENOX) injection 40 mg  40 mg Subcutaneous BID Judd Gaudier V, MD   40 mg at 02/22/20 0942  . hydrocortisone sodium succinate (SOLU-CORTEF) 100 MG injection 50 mg  50 mg Intravenous Q6H Fritzi Mandes, MD   50 mg at 02/22/20 0610  . insulin aspart (novoLOG) injection 0-20 Units  0-20 Units Subcutaneous TID WC Athena Masse, MD   4 Units at 02/22/20 [phone]  . insulin aspart (novoLOG) injection 0-5 Units  0-5 Units Subcutaneous QHS Judd Gaudier V, MD      . ipratropium-albuterol (DUONEB) 0.5-2.5 (3) MG/3ML nebulizer solution 3 mL  3 mL Nebulization Q6H PRN Awilda Bill, NP   3 mL at 02/21/20 1605  . [START ON 02/23/2020] levothyroxine (SYNTHROID) tablet 250 mcg  250 mcg Oral q morning - 10a Fritzi Mandes, MD      .  lidocaine (LIDODERM) 5 % 1 patch  1 patch Transdermal Daily Fritzi Mandes, MD   1 patch at 02/22/20 0943  . multivitamin with minerals tablet 1 tablet  1 tablet Oral Daily Fritzi Mandes, MD   1 tablet at 02/22/20 0944  . norepinephrine (LEVOPHED) 4mg  in 222mL premix infusion  0-40 mcg/min Intravenous Continuous Fritzi Mandes, MD 7.5 mL/hr at 02/22/20 0733 2 mcg/min at 02/22/20 0733  . ondansetron (ZOFRAN) tablet 4 mg  4 mg Oral Q6H PRN Athena Masse, MD       Or  . ondansetron [facility]) injection 4 mg  4 mg Intravenous Q6H PRN Athena Masse, MD       Current Outpatient Medications  Medication Sig Dispense Refill  . acetaminophen (TYLENOL) 500 MG tablet Take 1,000 mg by mouth every 8 (eight) hours.    [name] atorvastatin (LIPITOR) 20 MG tablet Take 20 mg by mouth daily at 10 pm.    . Biotin 2.5 MG CAPS Take 2.5 mg by mouth daily.     [name] buPROPion (WELLBUTRIN XL) 150 MG 24 hr tablet Take 150 mg by mouth daily.    . diclofenac Sodium (VOLTAREN) 1 % GEL Apply 2 g topically 4 (four) times daily.    . furosemide (LASIX) 40 MG tablet Take 40  mg by mouth daily.    [name] gabapentin (NEURONTIN) 300 MG capsule Take 300 mg by mouth 2 (two) times daily as needed.    . metolazone (ZAROXOLYN) 2.5 MG tablet Take 2.5 mg by mouth daily.    . Multiple Vitamin (MULTIVITAMIN) tablet Take 1 tablet by mouth daily.    [name] OZEMPIC, 0.25 OR 0.5 MG/DOSE, 2 MG/1.5ML SOPN Inject 0.375 mLs into the skin once a week.    . propranolol (INDERAL) 20 MG tablet Take 20 mg by mouth 3 (three) times daily.    [name] rOPINIRole (REQUIP) 3 MG tablet Take 3 mg by mouth 3 (three) times daily.  3  . SYNTHROID 125 MCG tablet Take 250 mcg by mouth every morning.    . triamterene-hydrochlorothiazide (MAXZIDE) 75-50 MG tablet Take 1 tablet by mouth daily.    . valsartan (DIOVAN) 160 MG tablet Take 160 mg by mouth daily.    . clindamycin (CLEOCIN) 300 MG capsule Take 1 capsule (300 mg total) by mouth 4 (four) times daily. 28 capsule 0  . DULoxetine (CYMBALTA) 30 MG capsule Take 90 mg by mouth daily.    [name] lidocaine (LIDODERM) 5 % Place 1 patch onto the skin See admin instructions. Apply 1 patch on the skin for 12 hours each day and then remove.    . meloxicam (MOBIC) 7.5 MG tablet Take 1 tablet (7.5 mg total) by mouth daily. 30 tablet 0  . metFORMIN (GLUCOPHAGE) 500 MG tablet Take 250 mg by mouth daily with breakfast. (Patient not taking: Reported on 02/21/2020)    . potassium chloride (KLOR-CON) 10 MEQ tablet Take 10 mEq by mouth daily.        Allergies: No Known Allergies    Past Medical History: Past Medical History:  Diagnosis Date  . Diabetes mellitus without complication ([place])   . Hypothyroidism   . RLS (restless legs syndrome)   . Thyroid cancer ([place]) 2007     Past Surgical History: Past Surgical History:  Procedure Laterality Date  . ABDOMINAL HYSTERECTOMY    . COLONOSCOPY WITH PROPOFOL N/A 08/26/2015   Procedure: COLONOSCOPY WITH PROPOFOL;  Surgeon: Manya Silvas, MD;  Location[facility];  Service: Endoscopy;  Laterality:  N/A;  . THYROIDECTOMY        Family History: Family History  Problem Relation Age of Onset  . Breast cancer Neg Hx      Social History: Social History   Socioeconomic History  . Marital status: Married    Spouse name: Not on file  . Number of children: Not on file  . Years of education: Not on file  . Highest education level: Not on file  Occupational History  . Not on file  Tobacco Use  . Smoking status: Never Smoker  . Smokeless tobacco: Never Used  Substance and Sexual Activity  . Alcohol use: No  . Drug use: No  . Sexual activity: Not on file  Other Topics Concern  . Not on file  Social History Narrative  . Not on file   Social Determinants of Health   Financial Resource Strain:   . Difficulty of Paying Living Expenses:   Food Insecurity:   . Worried About Charity fundraiser in the Last Year:   . Arboriculturist in the Last Year:   Transportation Needs:   . Film/video editor (Medical):   [name] Lack of Transportation (Non-Medical):   Physical Activity:   . Days of Exercise per Week:   . Minutes of Exercise per Session:   Stress:   . Feeling of Stress :   Social Connections:   . Frequency of Communication with Friends and Family:   . Frequency of Social Gatherings with Friends and Family:   . Attends Religious Services:   . Active Member of Clubs or Organizations:   . Attends Archivist Meetings:   [name] Marital Status:   Intimate Partner Violence:   . Fear of Current or Ex-Partner:   . Emotionally Abused:   [name] Physically Abused:   . Sexually Abused:      Review of Systems: Review of Systems  Constitutional: Positive for chills and fever.  HENT: Negative.   Eyes: Negative.   Respiratory: Positive for sputum production and shortness of breath. Negative for cough, hemoptysis and wheezing.   Cardiovascular: Positive for leg swelling. Negative for chest pain, palpitations, orthopnea, claudication and PND.  Gastrointestinal: Negative.   Genitourinary: Negative.   Negative for dysuria, flank pain, frequency, hematuria and urgency.  Musculoskeletal: Negative for back pain, falls, joint pain, myalgias and neck pain.  Skin: Positive for itching and rash.  Neurological: Negative.   Endo/Heme/Allergies: Negative.   Psychiatric/Behavioral: Negative.     Vital Signs: Blood pressure 104/64, pulse 71, temperature 99.2 F (37.3 C), temperature source Oral, resp. rate 17, height 5\' 5"  (1.651 m), weight (!) 145.2 kg, SpO2 96 %.  Weight trends: Danley Danker Weights   02/20/20 1914 02/22/20 0730  Weight: (!) 145.2 kg (!) 145.2 kg    Physical Exam: General: Critically ill   Head: Normocephalic, atraumatic. Moist oral mucosal membranes  Eyes: Anicteric, PERRL  Neck: Supple, trachea midline  Lungs:  Clear to auscultation  Heart: Regular rate and rhythm  Abdomen:  Soft, nontender,   Extremities:  + peripheral edema.  Neurologic: Nonfocal, moving all four extremities  Skin: Bilateral erythema and induration of legs.          Lab results: Basic Metabolic Panel: Recent Labs  Lab 02/20/20 2128 02/21/20 0949 02/22/20 0609  NA 137  --  139  K 4.5  --  4.6  CL 95*  --  100  CO2 29  --  26  GLUCOSE 115*  --  159*  BUN 48*  --  61*  CREATININE 1.57* 2.33* 2.35*  CALCIUM 8.9  --  8.1*    Liver Function Tests: Recent Labs  Lab 02/20/20 2128  AST 47*  ALT 26  ALKPHOS 119  BILITOT 1.8*  PROT 7.8  ALBUMIN 2.6*   No results for input(s): LIPASE, AMYLASE in the last 168 hours. Recent Labs  Lab 02/21/20 1645  AMMONIA 32    CBC: Recent Labs  Lab 02/20/20 2128 02/21/20 0949 02/22/20 0609  WBC 19.3* 20.8* 19.1*  NEUTROABS 16.3*  --   --   HGB 11.0* 9.1* 10.3*  HCT 36.6 31.7* 33.3*  MCV 91.5 95.8 91.2  PLT 407* 397 395    Cardiac Enzymes: No results for input(s): CKTOTAL, CKMB, CKMBINDEX, TROPONINI in the last 168 hours.  BNP: Invalid input(s): POCBNP  CBG: Recent Labs  Lab 02/21/20 0728 02/21/20 1208 02/21/20 1602 02/21/20 2214  02/22/20 0948  GLUCAP 115* 137* 165* 177* 167*    Microbiology: Results for orders placed or performed during the hospital encounter of 02/20/20  Blood culture (routine x 2)     Status: None (Preliminary result)   Collection Time: 02/21/20  2:30 AM   Specimen: BLOOD  Result Value Ref Range Status   Specimen Description BLOOD LEFT FA  Final   Special Requests   Final    BOTTLES DRAWN AEROBIC AND ANAEROBIC Blood Culture adequate volume   Culture   Final    NO GROWTH 1 DAY Performed at [facility], [address] [place], [place] [address]    Report Status PENDING  Incomplete  Blood culture (routine x 2)     Status: None (Preliminary result)   Collection Time: 02/21/20  2:30 AM   Specimen: BLOOD  Result Value Ref Range Status   Specimen Description BLOOD RIGHT FA  Final   Special Requests   Final    BOTTLES DRAWN AEROBIC AND ANAEROBIC Blood Culture adequate volume   Culture   Final    NO GROWTH 1 DAY Performed at [facility], [address] [place],  [address]    Report Status PENDING  Incomplete  SARS Coronavirus 2 by RT PCR (hospital order, performed in [facility] hospital lab) Nasopharyngeal Nasopharyngeal Swab     Status: None   Collection Time: 02/21/20  2:30 AM   Specimen: Nasopharyngeal Swab  Result Value Ref Range Status   SARS Coronavirus 2 NEGATIVE NEGATIVE Final    Comment: (NOTE) SARS-CoV-2 target nucleic acids are NOT DETECTED.  The SARS-CoV-2 RNA is generally detectable in upper and lower respiratory specimens during the acute phase of infection. The lowest concentration of SARS-CoV-2 viral copies this assay can detect is 250 copies / mL. A negative result does not preclude SARS-CoV-2 infection and should not be used as the sole basis for treatment or other patient management decisions.  A negative result may occur with improper specimen collection / handling, submission of specimen other than nasopharyngeal swab, presence of  viral mutation(s) within the areas targeted by this assay, and inadequate number of viral copies (<250 copies / mL). A negative result must be combined with clinical observations, patient history, and epidemiological information.  Fact Sheet for Patients:   StrictlyIdeas.no  Fact Sheet for Healthcare Providers: BankingDealers.co.za  This test is not yet approved or  cleared by the [place] FDA and has been authorized for detection and/or diagnosis of SARS-CoV-2 by FDA under an Emergency Use Authorization (EUA).  This EUA will remain in effect (meaning  this test can be used) for the duration of the COVID-19 declaration under Section 564(b)(1) of the Act, 21 U.S.C. section 360bbb-3(b)(1), unless the authorization is terminated or revoked sooner.  Performed at [facility], [address] [place], [place] [address]   Culture, blood (single)     Status: None (Preliminary result)   Collection Time: 02/21/20  9:35 AM   Specimen: BLOOD  Result Value Ref Range Status   Specimen Description BLOOD BLOOD RIGHT HAND  Final   Special Requests   Final    BOTTLES DRAWN AEROBIC ONLY Blood Culture results may not be optimal due to an inadequate volume of blood received in culture bottles   Culture   Final    NO GROWTH < 24 HOURS Performed at [facility], [address] [place], [place] [address]    Report Status PENDING  Incomplete  MRSA PCR Screening     Status: None   Collection Time: 02/21/20  4:14 PM   Specimen: Nasal Mucosa; Nasopharyngeal  Result Value Ref Range Status   MRSA by PCR NEGATIVE NEGATIVE Final    Comment:        The GeneXpert MRSA Assay (FDA approved for NASAL specimens only), is one component of a comprehensive MRSA colonization surveillance program. It is not intended to diagnose MRSA infection nor to guide or monitor treatment for MRSA infections. Performed at [facility], [address] [place], [place] [address]     Coagulation Studies: Recent Labs    02/22/20 9629  LABPROT 14.9  INR 1.2    Urinalysis: Recent Labs    02/21/20 0342  COLORURINE YELLOW*  LABSPEC 1.010  PHURINE 6.0  GLUCOSEU NEGATIVE  HGBUR NEGATIVE  BILIRUBINUR NEGATIVE  KETONESUR NEGATIVE  PROTEINUR NEGATIVE  NITRITE NEGATIVE  LEUKOCYTESUR NEGATIVE      Imaging: US RENAL  Result Date: 02/21/2020 CLINICAL DATA:  Acute kidney injury EXAM: RENAL / URINARY TRACT ULTRASOUND COMPLETE COMPARISON:  None. FINDINGS: Right Kidney: Renal measurements: 10.2 x 6.2 x 5.9 cm = volume: 192 mL. Small cyst in the upper pole measuring 13 mm. Normal echotexture. Mild cortical thinning. No hydronephrosis. Left Kidney: Renal measurements: 10.2 x 5.8 x 6.4 cm = volume: 199 mL. Cortical thinning. Normal echotexture. No hydronephrosis. Small echogenic areas within the left kidney may reflect nonobstructing stones. Bladder: Appears normal for degree of bladder distention. Other: None. IMPRESSION: Mild cortical thinning.  No acute findings.  No hydronephrosis. Suspect left nephrolithiasis. Electronically Signed   By: Rolm Baptise M.D.   On: 02/21/2020 17:58   US Venous Img Lower Bilateral  Result Date: 02/20/2020 EXAM: BILATERAL LOWER EXTREMITY VENOUS DOPPLER ULTRASOUND TECHNIQUE: Gray-scale sonography with graded compression, as well as color Doppler and duplex ultrasound were performed to evaluate the lower extremity deep venous systems from the level of the common femoral vein and including the common femoral, femoral, profunda femoral, popliteal and calf veins including the posterior tibial, peroneal and gastrocnemius veins when visible. The superficial great saphenous vein was also interrogated. Spectral Doppler was utilized to evaluate flow at rest and with distal augmentation maneuvers in the common femoral, femoral and popliteal veins. COMPARISON:  None. FINDINGS: RIGHT LOWER EXTREMITY Common Femoral  Vein: No evidence of thrombus. Normal compressibility, respiratory phasicity and response to augmentation. Saphenofemoral Junction: No evidence of thrombus. Normal compressibility and flow on color Doppler imaging. Profunda Femoral Vein: No evidence of thrombus. Normal compressibility and flow on color Doppler imaging. Femoral Vein: No evidence of thrombus. Normal compressibility, respiratory  phasicity and response to augmentation. Popliteal Vein: No evidence of thrombus. Normal compressibility, respiratory phasicity and response to augmentation. Calf Veins: No evidence of thrombus. Normal compressibility and flow on color Doppler imaging. Peroneal vein not visualized. Superficial Great Saphenous Vein: No evidence of thrombus. Normal compressibility. Venous Reflux:  None. Other Findings: Mildly complex hypoechoic collection at the right popliteal fossa of measures 7.2 x 1.6 x 2.4 cm, consistent with a Baker cyst. Mild diffuse soft tissue/interstitial edema noted within the soft tissues of the lower leg/calf. LEFT LOWER EXTREMITY Common Femoral Vein: No evidence of thrombus. Normal compressibility, respiratory phasicity and response to augmentation. Saphenofemoral Junction: No evidence of thrombus. Normal compressibility and flow on color Doppler imaging. Profunda Femoral Vein: No evidence of thrombus. Normal compressibility and flow on color Doppler imaging. Femoral Vein: No evidence of thrombus. Normal compressibility, respiratory phasicity and response to augmentation. Popliteal Vein: No evidence of thrombus. Normal compressibility, respiratory phasicity and response to augmentation. Calf Veins: No evidence of thrombus. Normal compressibility and flow on color Doppler imaging. Peroneal vein not visualized. Superficial Great Saphenous Vein: No evidence of thrombus. Normal compressibility. Venous Reflux:  None. Other Findings: Hypoechoic collection at the left popliteal fossa measures 1.7 x 0.6 x 1.8 cm, suggesting  a small Baker cyst. Mild diffuse soft tissue/interstitial edema noted within the soft tissues of the lower leg/calf. IMPRESSION: 1. No evidence of deep venous thrombosis in either lower extremity. 2. Mild diffuse soft tissue/interstitial edema within the lower legs/calves bilaterally. 3. Bilateral Baker's cysts as detailed above, right larger than left. Electronically Signed   By: Jeannine Boga M.D.   On: 02/20/2020 21:23   DG Chest Portable 1 View  Result Date: 02/21/2020 CLINICAL DATA:  64 year old female with shortness of breath, severe hypoxia. COVID-19. Status pending. EXAM: PORTABLE CHEST 1 VIEW COMPARISON:  Portable chest 01/25/2014. FINDINGS: Portable AP upright view at 0122 hours. Lower lung volumes. Mild cardiomegaly. Other mediastinal contours are within normal limits. Visualized tracheal air column is within normal limits. Streaky bilateral perihilar opacity. More confluent left lung base opacity. No superimposed pneumothorax. No definite pleural effusions. No acute osseous abnormality identified. IMPRESSION: Low lung volumes with streaky bilateral lung opacity, possible left lung base consolidation. The appearance is nonspecific but consider pneumonia, including viral/atypical etiologies. Aspiration is also in the differential. Electronically Signed   By: Genevie Ann M.D.   On: 02/21/2020 01:34   ECHOCARDIOGRAM COMPLETE  Result Date: 02/22/2020    ECHOCARDIOGRAM REPORT   Patient Name:   ALLEYAH TWOMBLY Date of Exam: 02/21/2020 Medical Rec #:  [id number]             Height:       65.0 in Accession #:    [id number]            Weight:       320.0 lb Date of Birth:  May 02, 1956             BSA:          2.415 m Patient Age:    6 years              BP:           95/49 mmHg Patient Gender: F                     HR:           62 bpm. Exam Location:  [facility] Procedure: 2D Echo Indications:     Abnormal ECG R94.31  History:         Patient has no prior history of Echocardiogram examinations.   Sonographer:     Arville Go RDCS Referring Phys:  [place] Diagnosing Phys: Serafina Royals MD  Sonographer Comments: Technically challenging study due to limited acoustic windows, Technically difficult study due to poor echo windows and no subcostal window. Image acquisition challenging due to patient body habitus. IMPRESSIONS  1. Left ventricular ejection fraction, by estimation, is 50 to 55%. The left ventricle has low normal function. The left ventricle has no regional wall motion abnormalities. Left ventricular diastolic parameters were normal.  2. Right ventricular systolic function is normal. The right ventricular size is normal.  3. Left atrial size was mildly dilated.  4. The mitral valve is normal in structure. Mild to moderate mitral valve regurgitation.  5. The aortic valve is normal in structure. Aortic valve regurgitation is mild.  6. Aortic dilatation noted. 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 FINDINGS  Left Ventricle: Left ventricular ejection fraction, by estimation, is 50 to 55%. The left ventricle has low normal function. The left ventricle has no regional wall motion abnormalities. The left ventricular internal cavity size was normal in size. There is no left ventricular hypertrophy. Left ventricular diastolic parameters were normal. Right Ventricle: The right ventricular size is normal. No increase in right ventricular wall thickness. Right ventricular systolic function is normal. Left Atrium: Left atrial size was mildly dilated. Right Atrium: Right atrial size was normal in size. Pericardium: There is no evidence of pericardial effusion. Mitral Valve: The mitral valve is normal in structure. Mild to moderate mitral valve regurgitation. Tricuspid Valve: The tricuspid valve is normal in structure. Tricuspid valve regurgitation is mild. Aortic Valve: The aortic valve is normal in structure. Aortic valve regurgitation is mild. Aortic valve peak gradient measures 9.6 mmHg.  Pulmonic Valve: The pulmonic valve was normal in structure. Pulmonic valve regurgitation is not visualized. Aorta: Aortic dilatation noted. There is an aneurysm involving the aortic root and ascending aorta. IAS/Shunts: No atrial level shunt detected by color flow Doppler.  LEFT VENTRICLE PLAX 2D LVIDd:         5.81 cm  Diastology LVIDs:         4.05 cm  LV e' lateral:   6.53 cm/s LV PW:         1.20 cm  LV E/e' lateral: 12.8 LV IVS:        1.33 cm  LV e' medial:    5.44 cm/s LVOT diam:     2.10 cm  LV E/e' medial:  15.4 LV SV:         49 LV SV Index:   20 LVOT Area:     3.46 cm  RIGHT VENTRICLE RV Basal diam:  4.05 cm RV S prime:     12.80 cm/s TAPSE (M-mode): 2.3 cm LEFT ATRIUM           Index       RIGHT ATRIUM           Index LA diam:      2.70 cm 1.12 cm/m  RA Area:     18.20 cm LA Vol (A4C): 50.8 ml 21.03 ml/m RA Volume:   52.90 ml  21.90 ml/m  AORTIC VALVE                PULMONIC VALVE AV Area (Vmax): 1.47 cm    PV Vmax:       1.41 m/s AV Vmax:  155.00 cm/s PV Peak grad:  8.0 mmHg AV Peak Grad:   9.6 mmHg LVOT Vmax:      65.90 cm/s LVOT Vmean:     43.300 cm/s LVOT VTI:       0.141 m  AORTA Ao Root diam: 5.10 cm Ao Asc diam:  4.10 cm MITRAL VALVE               TRICUSPID VALVE MV Area (PHT): 4.15 cm    TV Peak grad:   34.9 mmHg MV Decel Time: 183 msec    TV Vmax:        2.96 m/s MV E velocity: 83.60 cm/s MV A velocity: 75.80 cm/s  SHUNTS MV E/A ratio:  1.10        Systemic VTI:  0.14 m                            Systemic Diam: 2.10 cm Serafina Royals MD Electronically signed by Serafina Royals MD Signature Date/Time: 02/22/2020/8:58:54 AM    Final       Assessment & Plan: Ms. Mee Macdonnell is a 64 y.o. white female with hypertension, lymphedema, diabetes mellitus type II, obstructive sleep apnea, hypothyroidism, who was admitted to [facility] on 02/20/2020 for Severe sepsis ([place]) [A41.9, R65.20] Sepsis ([place]) [A41.9]  1. Acute renal failure on chronic kidney disease stage IIIB. Baseline  creatinine of 1.58, GFR of 34 on 01/25/2020.  Clear urine. No IV contrast exposure. Renal ultrasound with no obstruction.  - holding home medications of metolazone, furosemide, triamterene, hydrochlorothiazide, potassium chloride, valsartan and metformin Chronic kidney disease most likely secondary to diabetes and hypertension Acute renal failure is ATN from concurrent illness of sepsis and bacteremia.  Nonoliguric urine output.  - IV fluids: NS at 28mL/hr - no indication for dialysis.   2. Hypotension with septic shock: requiring norepinephrine.  Secondary to bilateral lower extremity cellulitis and pneumonia.  Cultures pending - empiric vancomycin, azithromycin and ceftriaxone.    LOS: 1 [place] 8/12/202112:46 PM

## 2020-02-22 NOTE — Progress Notes (Signed)
Name: Alekhya Gravlin MRN: [id number] DOB: September 16, 1955     CONSULTATION DATE: 02/20/2020  Subjective and objective: No major events last night.  Levophed has been titrated down to 2 mics  PAST MEDICAL HISTORY :   has a past medical history of Diabetes mellitus without complication ([place]), Hypothyroidism, RLS (restless legs syndrome), and Thyroid cancer ([place]) (2007).  has a past surgical history that includes Abdominal hysterectomy; Thyroidectomy; and Colonoscopy with propofol (N/A, 08/26/2015). Prior to Admission medications   Medication Sig Start Date End Date Taking? Authorizing Provider  acetaminophen (TYLENOL) 500 MG tablet Take 1,000 mg by mouth every 8 (eight) hours.   Yes [provider]  atorvastatin (LIPITOR) 20 MG tablet Take 20 mg by mouth daily at 10 pm. 01/31/20 01/30/21 Yes [provider]  Biotin 2.5 MG CAPS Take 2.5 mg by mouth daily.    Yes [provider]  buPROPion (WELLBUTRIN XL) 150 MG 24 hr tablet Take 150 mg by mouth daily.   Yes [provider]  diclofenac Sodium (VOLTAREN) 1 % GEL Apply 2 g topically 4 (four) times daily. 01/31/20  Yes [provider]  furosemide (LASIX) 40 MG tablet Take 40 mg by mouth daily. 01/27/20  Yes [provider]  gabapentin (NEURONTIN) 300 MG capsule Take 300 mg by mouth 2 (two) times daily as needed. 01/30/20  Yes [provider]  metolazone (ZAROXOLYN) 2.5 MG tablet Take 2.5 mg by mouth daily.   Yes [provider]  Multiple Vitamin (MULTIVITAMIN) tablet Take 1 tablet by mouth daily.   Yes [provider]  OZEMPIC, 0.25 OR 0.5 MG/DOSE, 2 MG/1.5ML SOPN Inject 0.375 mLs into the skin once a week. 02/04/20  Yes [provider]  propranolol (INDERAL) 20 MG tablet Take 20 mg by mouth 3 (three) times daily. 01/19/20  Yes [provider]  rOPINIRole (REQUIP) 3 MG tablet Take 3 mg by mouth 3 (three) times daily. 01/20/18  Yes [provider]    SYNTHROID 125 MCG tablet Take 250 mcg by mouth every morning. 01/30/20  Yes [provider]  triamterene-hydrochlorothiazide (MAXZIDE) 75-50 MG tablet Take 1 tablet by mouth daily. 01/11/20  Yes [provider]  valsartan (DIOVAN) 160 MG tablet Take 160 mg by mouth daily. 01/30/20  Yes [provider]  clindamycin (CLEOCIN) 300 MG capsule Take 1 capsule (300 mg total) by mouth 4 (four) times daily. 02/20/20   Cuthriell, Charline Bills, PA-C  DULoxetine (CYMBALTA) 30 MG capsule Take 90 mg by mouth daily. 01/27/20   [provider]  lidocaine (LIDODERM) 5 % Place 1 patch onto the skin See admin instructions. Apply 1 patch on the skin for 12 hours each day and then remove. 01/26/20   [provider]  meloxicam (MOBIC) 7.5 MG tablet Take 1 tablet (7.5 mg total) by mouth daily. 02/20/20 02/19/21  Cuthriell, Charline Bills, PA-C  metFORMIN (GLUCOPHAGE) 500 MG tablet Take 250 mg by mouth daily with breakfast. Patient not taking: Reported on 02/21/2020    [provider]  potassium chloride (KLOR-CON) 10 MEQ tablet Take 10 mEq by mouth daily. 12/16/19   [provider]   No Known Allergies  FAMILY HISTORY:  family history is not on file. SOCIAL HISTORY:  reports that she has never smoked. She has never used smokeless tobacco. She reports that she does not drink alcohol and does not use drugs.  REVIEW OF SYSTEMS:   Unable to obtain due to critical illness      Estimated  body mass index is 53.25 kg/m as calculated from the following:   Height as of this encounter: 5\' 5"  (1.651 m).   Weight as of this encounter: 145.2 kg.    VITAL SIGNS: Pulse Rate:  [59-81] 68 (08/12 0909) Resp:  [12-26] 14 (08/12 0909) BP: (82-131)/(28-105) 94/57 (08/12 0830) SpO2:  [94 %-100 %] 95 % (08/12 0909) Weight:  [145.2 kg] 145.2 kg (08/12 0730)   I/O last 3 completed shifts: In: 2210 [Other:10; IV Piggyback:2200] Out: 1050 [Urine:1050] Total I/O In: 250 [IV  Piggyback:250] Out: 1000 [Urine:1000]   SpO2: 95 %   Physical Examination:  Awake and oriented with no focal motor deficits Tolerating nasal cannula, no distress, able to talk in full sentences, bilateral equal air entry and no adventitious sounds.  Dry mucous membranes S1 & S2 are audible with no murmur Benign obese abdomen with feeble peristalsis Bilateral leg cellulitis  CULTURE RESULTS   Recent Results (from the past 240 hour(s))  Blood culture (routine x 2)     Status: None (Preliminary result)   Collection Time: 02/21/20  2:30 AM   Specimen: BLOOD  Result Value Ref Range Status   Specimen Description BLOOD LEFT FA  Final   Special Requests   Final    BOTTLES DRAWN AEROBIC AND ANAEROBIC Blood Culture adequate volume   Culture   Final    NO GROWTH 1 DAY Performed at [facility], [address], [place], [place] [address]    Report Status PENDING  Incomplete  Blood culture (routine x 2)     Status: None (Preliminary result)   Collection Time: 02/21/20  2:30 AM   Specimen: BLOOD  Result Value Ref Range Status   Specimen Description BLOOD RIGHT FA  Final   Special Requests   Final    BOTTLES DRAWN AEROBIC AND ANAEROBIC Blood Culture adequate volume   Culture   Final    NO GROWTH 1 DAY Performed at [facility], [address] [place], [place] [address]    Report Status PENDING  Incomplete  SARS Coronavirus 2 by RT PCR (hospital order, performed in [place] hospital lab) Nasopharyngeal Nasopharyngeal Swab     Status: None   Collection Time: 02/21/20  2:30 AM   Specimen: Nasopharyngeal Swab  Result Value Ref Range Status   SARS Coronavirus 2 NEGATIVE NEGATIVE Final    Comment: (NOTE) SARS-CoV-2 target nucleic acids are NOT DETECTED.  The SARS-CoV-2 RNA is generally detectable in upper and lower respiratory specimens during the acute phase of infection. The lowest concentration of SARS-CoV-2 viral copies this assay can detect is 250 copies /  mL. A negative result does not preclude SARS-CoV-2 infection and should not be used as the sole basis for treatment or other patient management decisions.  A negative result may occur with improper specimen collection / handling, submission of specimen other than nasopharyngeal swab, presence of viral mutation(s) within the areas targeted by this assay, and inadequate number of viral copies (<250 copies / mL). A negative result must be combined with clinical observations, patient history, and epidemiological information.  Fact Sheet for Patients:   StrictlyIdeas.no  Fact Sheet for Healthcare Providers: BankingDealers.co.za  This test is not yet approved or  cleared by the [place] FDA and has been authorized for detection and/or diagnosis of SARS-CoV-2 by FDA under an Emergency Use Authorization (EUA).  This EUA will remain in effect (meaning this test can be used) for the duration of the COVID-19 declaration under Section 564(b)(1)  of the Act, 21 U.S.C. section 360bbb-3(b)(1), unless the authorization is terminated or revoked sooner.  Performed at [facility], [address] [place], [place] [address]   Culture, blood (single)     Status: None (Preliminary result)   Collection Time: 02/21/20  9:35 AM   Specimen: BLOOD  Result Value Ref Range Status   Specimen Description BLOOD BLOOD RIGHT HAND  Final   Special Requests   Final    BOTTLES DRAWN AEROBIC ONLY Blood Culture results may not be optimal due to an inadequate volume of blood received in culture bottles   Culture   Final    NO GROWTH < 24 HOURS Performed at [facility], [address] [place], [place] [address]    Report Status PENDING  Incomplete  MRSA PCR Screening     Status: None   Collection Time: 02/21/20  4:14 PM   Specimen: Nasal Mucosa; Nasopharyngeal  Result Value Ref Range Status   MRSA by PCR NEGATIVE NEGATIVE Final    Comment:         The GeneXpert MRSA Assay (FDA approved for NASAL specimens only), is one component of a comprehensive MRSA colonization surveillance program. It is not intended to diagnose MRSA infection nor to guide or monitor treatment for MRSA infections. Performed at [facility], [address] [place],  [address]           IMAGING    US RENAL  Result Date: 02/21/2020 CLINICAL DATA:  Acute kidney injury EXAM: RENAL / URINARY TRACT ULTRASOUND COMPLETE COMPARISON:  None. FINDINGS: Right Kidney: Renal measurements: 10.2 x 6.2 x 5.9 cm = volume: 192 mL. Small cyst in the upper pole measuring 13 mm. Normal echotexture. Mild cortical thinning. No hydronephrosis. Left Kidney: Renal measurements: 10.2 x 5.8 x 6.4 cm = volume: 199 mL. Cortical thinning. Normal echotexture. No hydronephrosis. Small echogenic areas within the left kidney may reflect nonobstructing stones. Bladder: Appears normal for degree of bladder distention. Other: None. IMPRESSION: Mild cortical thinning.  No acute findings.  No hydronephrosis. Suspect left nephrolithiasis. Electronically Signed   By: Rolm Baptise M.D.   On: 02/21/2020 17:58   ECHOCARDIOGRAM COMPLETE  Result Date: 02/22/2020    ECHOCARDIOGRAM REPORT   Patient Name:   MAYLI COVINGTON Date of Exam: 02/21/2020 Medical Rec #:  [id number]             Height:       65.0 in Accession #:    [id number]            Weight:       320.0 lb Date of Birth:  [birth year]             BSA:          2.415 m Patient Age:    64 years              BP:           95/49 mmHg Patient Gender: F                     HR:           62 bpm. Exam Location:  [facility] Procedure: 2D Echo Indications:     Abnormal ECG R94.31  History:         Patient has no prior history of Echocardiogram examinations.  Sonographer:     Arville Go RDCS Referring Phys:  [place] Diagnosing Phys: Serafina Royals MD  Sonographer Comments: Technically challenging study due to limited acoustic  windows, Technically difficult study due to poor echo windows and no subcostal window. Image acquisition challenging due to patient body habitus. IMPRESSIONS  1. Left ventricular ejection fraction, by estimation, is 50 to 55%. The left ventricle has low normal function. The left ventricle has no regional wall motion abnormalities. Left ventricular diastolic parameters were normal.  2. Right ventricular systolic function is normal. The right ventricular size is normal.  3. Left atrial size was mildly dilated.  4. The mitral valve is normal in structure. Mild to moderate mitral valve regurgitation.  5. The aortic valve is normal in structure. Aortic valve regurgitation is mild.  6. Aortic dilatation noted. 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 FINDINGS  Left Ventricle: Left ventricular ejection fraction, by estimation, is 50 to 55%. The left ventricle has low normal function. The left ventricle has no regional wall motion abnormalities. The left ventricular internal cavity size was normal in size. There is no left ventricular hypertrophy. Left ventricular diastolic parameters were normal. Right Ventricle: The right ventricular size is normal. No increase in right ventricular wall thickness. Right ventricular systolic function is normal. Left Atrium: Left atrial size was mildly dilated. Right Atrium: Right atrial size was normal in size. Pericardium: There is no evidence of pericardial effusion. Mitral Valve: The mitral valve is normal in structure. Mild to moderate mitral valve regurgitation. Tricuspid Valve: The tricuspid valve is normal in structure. Tricuspid valve regurgitation is mild. Aortic Valve: The aortic valve is normal in structure. Aortic valve regurgitation is mild. Aortic valve peak gradient measures 9.6 mmHg. Pulmonic Valve: The pulmonic valve was normal in structure. Pulmonic valve regurgitation is not visualized. Aorta: Aortic dilatation noted. There is an aneurysm involving the aortic  root and ascending aorta. IAS/Shunts: No atrial level shunt detected by color flow Doppler.  LEFT VENTRICLE PLAX 2D LVIDd:         5.81 cm  Diastology LVIDs:         4.05 cm  LV e' lateral:   6.53 cm/s LV PW:         1.20 cm  LV E/e' lateral: 12.8 LV IVS:        1.33 cm  LV e' medial:    5.44 cm/s LVOT diam:     2.10 cm  LV E/e' medial:  15.4 LV SV:         49 LV SV Index:   20 LVOT Area:     3.46 cm  RIGHT VENTRICLE RV Basal diam:  4.05 cm RV S prime:     12.80 cm/s TAPSE (M-mode): 2.3 cm LEFT ATRIUM           Index       RIGHT ATRIUM           Index LA diam:      2.70 cm 1.12 cm/m  RA Area:     18.20 cm LA Vol (A4C): 50.8 ml 21.03 ml/m RA Volume:   52.90 ml  21.90 ml/m  AORTIC VALVE                PULMONIC VALVE AV Area (Vmax): 1.47 cm    PV Vmax:       1.41 m/s AV Vmax:        155.00 cm/s PV Peak grad:  8.0 mmHg AV Peak Grad:   9.6 mmHg LVOT Vmax:      65.90 cm/s LVOT Vmean:     43.300  cm/s LVOT VTI:       0.141 m  AORTA Ao Root diam: 5.10 cm Ao Asc diam:  4.10 cm MITRAL VALVE               TRICUSPID VALVE MV Area (PHT): 4.15 cm    TV Peak grad:   34.9 mmHg MV Decel Time: 183 msec    TV Vmax:        2.96 m/s MV E velocity: 83.60 cm/s MV A velocity: 75.80 cm/s  SHUNTS MV E/A ratio:  1.10        Systemic VTI:  0.14 m                            Systemic Diam: 2.10 cm Serafina Royals MD Electronically signed by Serafina Royals MD Signature Date/Time: 02/22/2020/8:58:54 AM    Final     Assessment and plan: Acute hypercapnic and hypoxic respiratory failure with underlying chronic respiratory failure with obesity hypoventilation syndrome. -Tolerating nasal cannula.  Monitor work of breathing and O2 sat  Altered mental status with toxic metabolic encephalopathy narcotics.  Improved, patient is verbally responsive and nonfocal neuro exam.  Hypotension with possible septic etiology /intravascular volume depletion -Monitor hemodynamics.  Optimize hydration and vasopressors.  Atelectasis and pneumonia.   Bibasilar airspace disease -Rocephin, Vanc and Zithromax. -Monitor chest x-ray, CBC and FiO2 requirement  Cellulitis of bil lower extremity.  DVT is ruled out by venous Doppler. -Continue with antimicrobial, follow with blood culture and monitor white cell count  AKI.  (Worsening) with prerenal azotemia and intravascular volume deplation / ATN -Monitor renal panel and urine output. -Renal ultrasound: Mild cortical thinning.  No acute findings.  No hydronephrosis. Suspect left nephrolithiasis.  Hypothyroidism on levothyroxine -Free T4 1.31  Anemia -Keep hemoglobin more than 7 g/dL  ECHO. LVEF 5055% no regional wall abnormality  DVT prophylaxis.  Continue supportive care  Continue with care as per primary care team  Critical care time 35 min

## 2020-02-23 DIAGNOSIS — L03115 Cellulitis of right lower limb: Secondary | ICD-10-CM

## 2020-02-23 DIAGNOSIS — L03116 Cellulitis of left lower limb: Secondary | ICD-10-CM

## 2020-02-23 DIAGNOSIS — N179 Acute kidney failure, unspecified: Secondary | ICD-10-CM

## 2020-02-23 DIAGNOSIS — G9341 Metabolic encephalopathy: Secondary | ICD-10-CM

## 2020-02-23 DIAGNOSIS — J189 Pneumonia, unspecified organism: Secondary | ICD-10-CM

## 2020-02-23 LAB — CBC
HCT: 29.4 % — ABNORMAL LOW (ref 36.0–46.0)
Hemoglobin: 8.8 g/dL — ABNORMAL LOW (ref 12.0–15.0)
MCH: 28 pg (ref 26.0–34.0)
MCHC: 29.9 g/dL — ABNORMAL LOW (ref 30.0–36.0)
MCV: 93.6 fL (ref 80.0–100.0)
Platelets: 367 10*3/uL (ref 150–400)
RBC: 3.14 MIL/uL — ABNORMAL LOW (ref 3.87–5.11)
RDW: 17 % — ABNORMAL HIGH (ref 11.5–15.5)
WBC: 15.5 10*3/uL — ABNORMAL HIGH (ref 4.0–10.5)
nRBC: 0.1 % (ref 0.0–0.2)

## 2020-02-23 LAB — GLUCOSE, CAPILLARY
Glucose-Capillary: 148 mg/dL — ABNORMAL HIGH (ref 70–99)
Glucose-Capillary: 136 mg/dL — ABNORMAL HIGH (ref 70–99)
Glucose-Capillary: 72 mg/dL (ref 70–99)
Glucose-Capillary: 77 mg/dL (ref 70–99)

## 2020-02-23 LAB — COMPREHENSIVE METABOLIC PANEL
ALT: 32 U/L (ref 0–44)
AST: 53 U/L — ABNORMAL HIGH (ref 15–41)
Albumin: 2.4 g/dL — ABNORMAL LOW (ref 3.5–5.0)
Alkaline Phosphatase: 121 U/L (ref 38–126)
Anion gap: 12 (ref 5–15)
BUN: 62 mg/dL — ABNORMAL HIGH (ref 8–23)
CO2: 27 mmol/L (ref 22–32)
Calcium: 7.9 mg/dL — ABNORMAL LOW (ref 8.9–10.3)
Chloride: 101 mmol/L (ref 98–111)
Creatinine, Ser: 1.83 mg/dL — ABNORMAL HIGH (ref 0.44–1.00)
GFR calc Af Amer: 33 mL/min — ABNORMAL LOW (ref >60)
GFR calc non Af Amer: 28 mL/min — ABNORMAL LOW (ref >60)
Glucose, Bld: 163 mg/dL — ABNORMAL HIGH (ref 70–99)
Potassium: 3.8 mmol/L (ref 3.5–5.1)
Sodium: 140 mmol/L (ref 135–145)
Total Bilirubin: 0.7 mg/dL (ref 0.3–1.2)
Total Protein: 6.8 g/dL (ref 6.5–8.1)

## 2020-02-23 LAB — PHOSPHORUS: Phosphorus: 4.2 mg/dL (ref 2.5–4.6)

## 2020-02-23 LAB — PROCALCITONIN: Procalcitonin: 1.44 ng/mL

## 2020-02-23 LAB — TSH: TSH: 0.762 u[IU]/mL (ref 0.350–4.500)

## 2020-02-23 MED ORDER — ROPINIROLE HCL 0.25 MG PO TABS
0.2500 mg | ORAL_TABLET | Freq: Every day | ORAL | Status: DC
  Administered 2020-02-23: 0.25 mg via ORAL
  Filled 2020-02-23: qty 1

## 2020-02-23 MED ORDER — GABAPENTIN 300 MG PO CAPS: 300.0000 mg | ORAL_CAPSULE | Freq: Two times a day (BID) | ORAL | Status: DC | PRN

## 2020-02-23 MED ORDER — HYDROCORTISONE NA SUCCINATE PF 100 MG IJ SOLR
50.0000 mg | Freq: Three times a day (TID) | INTRAMUSCULAR | Status: DC
  Filled 2020-02-23: qty 1

## 2020-02-23 MED ORDER — ROPINIROLE HCL 1 MG PO TABS
3.0000 mg | ORAL_TABLET | Freq: Three times a day (TID) | ORAL | Status: DC
  Administered 2020-02-23 – 2020-03-01 (×21): 3 mg via ORAL
  Filled 2020-02-23 (×23): qty 3

## 2020-02-23 MED ORDER — CHLORHEXIDINE GLUCONATE CLOTH 2 % EX PADS
6.0000 | MEDICATED_PAD | Freq: Every day | CUTANEOUS | Status: DC
  Administered 2020-02-25 – 2020-02-28 (×2): 6 via TOPICAL

## 2020-02-23 MED ORDER — GABAPENTIN 300 MG PO CAPS
300.0000 mg | ORAL_CAPSULE | Freq: Two times a day (BID) | ORAL | Status: DC
  Administered 2020-02-23 – 2020-02-24 (×2): 300 mg via ORAL
  Filled 2020-02-23 (×2): qty 1

## 2020-02-23 MED ORDER — TRAMADOL HCL 50 MG PO TABS
50.0000 mg | ORAL_TABLET | Freq: Four times a day (QID) | ORAL | Status: DC | PRN
  Administered 2020-02-26 – 2020-03-01 (×12): 50 mg via ORAL
  Filled 2020-02-23 (×12): qty 1

## 2020-02-23 NOTE — ED Notes (Signed)
Pt boosted up in bed and assisted with bedpan.

## 2020-02-23 NOTE — ED Notes (Signed)
Attempted to call report- RN busy. Extension provided for call-back

## 2020-02-23 NOTE — Progress Notes (Signed)
[facility]  ROUNDING NOTE   Subjective:   UOP 1800. Weaned off norepinephrine.   Creatinine 1.83 (2.35)  Objective:  Vital signs in last 24 hours:  Pulse Rate:  [67-75] 72 (08/13 0532) Resp:  [14-25] 22 (08/13 0532) BP: (81-117)/(46-77) 103/55 (08/13 0532) SpO2:  [82 %-100 %] 89 % (08/13 0532)  Weight change:  Filed Weights   02/20/20 1914 02/22/20 0730  Weight: (!) 145.2 kg (!) 145.2 kg    Intake/Output: I/O last 3 completed shifts: In: 350 [IV Piggyback:350] Out: 2400 [Urine:2400]   Intake/Output this shift:  Total I/O In: 100 [IV Piggyback:100] Out: -   Physical Exam: General: NAD,   Head: Normocephalic, atraumatic. Moist oral mucosal membranes  Eyes: Anicteric, PERRL  Neck: Supple, trachea midline  Lungs:  Clear to auscultation  Heart: Regular rate and rhythm  Abdomen:  Soft, nontender,   Extremities:  no peripheral edema.  Neurologic: Nonfocal, moving all four extremities  Skin: No lesions        Basic Metabolic Panel: Recent Labs  Lab 02/20/20 2128 02/21/20 0949 02/22/20 0609 02/23/20 0728  NA 137  --  139 140  K 4.5  --  4.6 3.8  CL 95*  --  100 101  CO2 29  --  26 27  GLUCOSE 115*  --  159* 163*  BUN 48*  --  61* 62*  CREATININE 1.57* 2.33* 2.35* 1.83*  CALCIUM 8.9  --  8.1* 7.9*  PHOS  --   --   --  4.2    Liver Function Tests: Recent Labs  Lab 02/20/20 2128 02/23/20 0728  AST 47* 53*  ALT 26 32  ALKPHOS 119 121  BILITOT 1.8* 0.7  PROT 7.8 6.8  ALBUMIN 2.6* 2.4*   No results for input(s): LIPASE, AMYLASE in the last 168 hours. Recent Labs  Lab 02/21/20 1645  AMMONIA 32    CBC: Recent Labs  Lab 02/20/20 2128 02/21/20 0949 02/22/20 0609 02/23/20 0728  WBC 19.3* 20.8* 19.1* 15.5*  NEUTROABS 16.3*  --   --   --   HGB 11.0* 9.1* 10.3* 8.8*  HCT 36.6 31.7* 33.3* 29.4*  MCV 91.5 95.8 91.2 93.6  PLT 407* 397 395 367    Cardiac Enzymes: No results for input(s): CKTOTAL, CKMB, CKMBINDEX, TROPONINI in the  last 168 hours.  BNP: Invalid input(s): POCBNP  CBG: Recent Labs  Lab 02/22/20 0948 02/22/20 1353 02/22/20 1809 02/22/20 2307 02/23/20 0813  GLUCAP 167* 146* 166* 159* 148*    Microbiology: Results for orders placed or performed during the hospital encounter of 02/20/20  Blood culture (routine x 2)     Status: None (Preliminary result)   Collection Time: 02/21/20  2:30 AM   Specimen: BLOOD  Result Value Ref Range Status   Specimen Description BLOOD LEFT FA  Final   Special Requests   Final    BOTTLES DRAWN AEROBIC AND ANAEROBIC Blood Culture adequate volume   Culture   Final    NO GROWTH 2 DAYS Performed at [facility], [address] [place], [place] [address]    Report Status PENDING  Incomplete  Blood culture (routine x 2)     Status: None (Preliminary result)   Collection Time: 02/21/20  2:30 AM   Specimen: BLOOD  Result Value Ref Range Status   Specimen Description BLOOD RIGHT FA  Final   Special Requests   Final    BOTTLES DRAWN AEROBIC AND ANAEROBIC Blood Culture adequate volume   Culture  Final    NO GROWTH 2 DAYS Performed at [facility], [address] [place], [place] [address]    Report Status PENDING  Incomplete  SARS Coronavirus 2 by RT PCR (hospital order, performed in [facility] hospital lab) Nasopharyngeal Nasopharyngeal Swab     Status: None   Collection Time: 02/21/20  2:30 AM   Specimen: Nasopharyngeal Swab  Result Value Ref Range Status   SARS Coronavirus 2 NEGATIVE NEGATIVE Final    Comment: (NOTE) SARS-CoV-2 target nucleic acids are NOT DETECTED.  The SARS-CoV-2 RNA is generally detectable in upper and lower respiratory specimens during the acute phase of infection. The lowest concentration of SARS-CoV-2 viral copies this assay can detect is 250 copies / mL. A negative result does not preclude SARS-CoV-2 infection and should not be used as the sole basis for treatment or other patient management decisions.  A  negative result may occur with improper specimen collection / handling, submission of specimen other than nasopharyngeal swab, presence of viral mutation(s) within the areas targeted by this assay, and inadequate number of viral copies (<250 copies / mL). A negative result must be combined with clinical observations, patient history, and epidemiological information.  Fact Sheet for Patients:   StrictlyIdeas.no  Fact Sheet for Healthcare Providers: BankingDealers.co.za  This test is not yet approved or  cleared by the [place] FDA and has been authorized for detection and/or diagnosis of SARS-CoV-2 by FDA under an Emergency Use Authorization (EUA).  This EUA will remain in effect (meaning this test can be used) for the duration of the COVID-19 declaration under Section 564(b)(1) of the Act, 21 U.S.C. section 360bbb-3(b)(1), unless the authorization is terminated or revoked sooner.  Performed at [facility], [address] [place], [place] [address]   Culture, blood (single)     Status: None (Preliminary result)   Collection Time: 02/21/20  9:35 AM   Specimen: BLOOD  Result Value Ref Range Status   Specimen Description BLOOD BLOOD RIGHT HAND  Final   Special Requests   Final    BOTTLES DRAWN AEROBIC ONLY Blood Culture results may not be optimal due to an inadequate volume of blood received in culture bottles   Culture   Final    NO GROWTH 2 DAYS Performed at [facility], [address], [place], [place] [address]    Report Status PENDING  Incomplete  MRSA PCR Screening     Status: None   Collection Time: 02/21/20  4:14 PM   Specimen: Nasal Mucosa; Nasopharyngeal  Result Value Ref Range Status   MRSA by PCR NEGATIVE NEGATIVE Final    Comment:        The GeneXpert MRSA Assay (FDA approved for NASAL specimens only), is one component of a comprehensive MRSA colonization surveillance program. It is  not intended to diagnose MRSA infection nor to guide or monitor treatment for MRSA infections. Performed at [facility], [address] [place], [place] [address]     Coagulation Studies: Recent Labs    02/22/20 9371  LABPROT 14.9  INR 1.2    Urinalysis: Recent Labs    02/21/20 0342  COLORURINE YELLOW*  LABSPEC 1.010  PHURINE 6.0  GLUCOSEU NEGATIVE  HGBUR NEGATIVE  BILIRUBINUR NEGATIVE  KETONESUR NEGATIVE  PROTEINUR NEGATIVE  NITRITE NEGATIVE  LEUKOCYTESUR NEGATIVE      Imaging: US RENAL  Result Date: 02/21/2020 CLINICAL DATA:  Acute kidney injury EXAM: RENAL / URINARY TRACT ULTRASOUND COMPLETE COMPARISON:  None. FINDINGS: Right Kidney:  Renal measurements: 10.2 x 6.2 x 5.9 cm = volume: 192 mL. Small cyst in the upper pole measuring 13 mm. Normal echotexture. Mild cortical thinning. No hydronephrosis. Left Kidney: Renal measurements: 10.2 x 5.8 x 6.4 cm = volume: 199 mL. Cortical thinning. Normal echotexture. No hydronephrosis. Small echogenic areas within the left kidney may reflect nonobstructing stones. Bladder: Appears normal for degree of bladder distention. Other: None. IMPRESSION: Mild cortical thinning.  No acute findings.  No hydronephrosis. Suspect left nephrolithiasis. Electronically Signed   By: Rolm Baptise M.D.   On: 02/21/2020 17:58   ECHOCARDIOGRAM COMPLETE  Result Date: 02/22/2020    ECHOCARDIOGRAM REPORT   Patient Name:   Madison Mcbride Date of Exam: 02/21/2020 Medical Rec #:  [id number]             Height:       65.0 in Accession #:    [id number]            Weight:       320.0 lb Date of Birth:  15-Aug-1955             BSA:          2.415 m Patient Age:    44 years              BP:           95/49 mmHg Patient Gender: F                     HR:           62 bpm. Exam Location:  [facility] Procedure: 2D Echo Indications:     Abnormal ECG R94.31  History:         Patient has no prior history of Echocardiogram examinations.  Sonographer:     Arville Go RDCS Referring Phys:  [place] Diagnosing Phys: Serafina Royals MD  Sonographer Comments: Technically challenging study due to limited acoustic windows, Technically difficult study due to poor echo windows and no subcostal window. Image acquisition challenging due to patient body habitus. IMPRESSIONS  1. Left ventricular ejection fraction, by estimation, is 50 to 55%. The left ventricle has low normal function. The left ventricle has no regional wall motion abnormalities. Left ventricular diastolic parameters were normal.  2. Right ventricular systolic function is normal. The right ventricular size is normal.  3. Left atrial size was mildly dilated.  4. The mitral valve is normal in structure. Mild to moderate mitral valve regurgitation.  5. The aortic valve is normal in structure. Aortic valve regurgitation is mild.  6. Aortic dilatation noted. 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 FINDINGS  Left Ventricle: Left ventricular ejection fraction, by estimation, is 50 to 55%. The left ventricle has low normal function. The left ventricle has no regional wall motion abnormalities. The left ventricular internal cavity size was normal in size. There is no left ventricular hypertrophy. Left ventricular diastolic parameters were normal. Right Ventricle: The right ventricular size is normal. No increase in right ventricular wall thickness. Right ventricular systolic function is normal. Left Atrium: Left atrial size was mildly dilated. Right Atrium: Right atrial size was normal in size. Pericardium: There is no evidence of pericardial effusion. Mitral Valve: The mitral valve is normal in structure. Mild to moderate mitral valve regurgitation. Tricuspid Valve: The tricuspid valve is normal in structure. Tricuspid valve regurgitation is mild. Aortic Valve: The aortic valve is normal in structure. Aortic valve regurgitation is mild. Aortic valve  peak gradient measures 9.6 mmHg. Pulmonic Valve: The pulmonic  valve was normal in structure. Pulmonic valve regurgitation is not visualized. Aorta: Aortic dilatation noted. There is an aneurysm involving the aortic root and ascending aorta. IAS/Shunts: No atrial level shunt detected by color flow Doppler.  LEFT VENTRICLE PLAX 2D LVIDd:         5.81 cm  Diastology LVIDs:         4.05 cm  LV e' lateral:   6.53 cm/s LV PW:         1.20 cm  LV E/e' lateral: 12.8 LV IVS:        1.33 cm  LV e' medial:    5.44 cm/s LVOT diam:     2.10 cm  LV E/e' medial:  15.4 LV SV:         49 LV SV Index:   20 LVOT Area:     3.46 cm  RIGHT VENTRICLE RV Basal diam:  4.05 cm RV S prime:     12.80 cm/s TAPSE (M-mode): 2.3 cm LEFT ATRIUM           Index       RIGHT ATRIUM           Index LA diam:      2.70 cm 1.12 cm/m  RA Area:     18.20 cm LA Vol (A4C): 50.8 ml 21.03 ml/m RA Volume:   52.90 ml  21.90 ml/m  AORTIC VALVE                PULMONIC VALVE AV Area (Vmax): 1.47 cm    PV Vmax:       1.41 m/s AV Vmax:        155.00 cm/s PV Peak grad:  8.0 mmHg AV Peak Grad:   9.6 mmHg LVOT Vmax:      65.90 cm/s LVOT Vmean:     43.300 cm/s LVOT VTI:       0.141 m  AORTA Ao Root diam: 5.10 cm Ao Asc diam:  4.10 cm MITRAL VALVE               TRICUSPID VALVE MV Area (PHT): 4.15 cm    TV Peak grad:   34.9 mmHg MV Decel Time: 183 msec    TV Vmax:        2.96 m/s MV E velocity: 83.60 cm/s MV A velocity: 75.80 cm/s  SHUNTS MV E/A ratio:  1.10        Systemic VTI:  0.14 m                            Systemic Diam: 2.10 cm Serafina Royals MD Electronically signed by Serafina Royals MD Signature Date/Time: 02/22/2020/8:58:54 AM    Final      Medications:   . sodium chloride Stopped (02/22/20 0700)  . sodium chloride Stopped (02/22/20 0732)  . sodium chloride 10 mL/hr at 02/22/20 2329  . azithromycin 500 mg (02/23/20 0856)  . cefTRIAXone (ROCEPHIN)  IV Stopped (02/23/20 0845)  . norepinephrine (LEVOPHED) Adult infusion Stopped (02/23/20 0021)   . atorvastatin  20 mg Oral Q2200  . budesonide (PULMICORT)  nebulizer solution  0.5 mg Nebulization BID  . diclofenac Sodium  2 g Topical QID  . enoxaparin (LOVENOX) injection  40 mg Subcutaneous BID  . hydrocortisone sod succinate (SOLU-CORTEF) inj  50 mg Intravenous Q6H  . insulin aspart  0-20 Units Subcutaneous TID WC  . insulin  aspart  0-5 Units Subcutaneous QHS  . levothyroxine  250 mcg Oral q morning - 10a  . lidocaine  1 patch Transdermal Daily  . multivitamin with minerals  1 tablet Oral Daily  . rOPINIRole  0.25 mg Oral QHS   acetaminophen **OR** acetaminophen, ipratropium-albuterol, ondansetron **OR** ondansetron (ZOFRAN) IV  Assessment/ Plan:  Ms. Madison Mcbride is a 64 y.o. white female with hypertension, lymphedema, diabetes mellitus type II, obstructive sleep apnea, hypothyroidism, who was admitted to [facility] on 02/20/2020 for Severe sepsis ([place]) [A41.9, R65.20] Sepsis ([place]) [A41.9]  1. Acute renal failure on chronic kidney disease stage IIIB. Baseline creatinine of 1.58, GFR of 34 on 01/25/2020.  Clear urine. No IV contrast exposure. Renal ultrasound with no obstruction.  - holding home medications of metolazone, furosemide, triamterene, hydrochlorothiazide, potassium chloride, valsartan and metformin Chronic kidney disease most likely secondary to diabetes and hypertension Acute renal failure is ATN from concurrent illness of sepsis and bacteremia.  Nonoliguric urine output. Creatinine improving.  - Discontinue IV fluids. Encourage PO intake.  - no indication for dialysis.   2. Hypotension with septic shock: required norepinephrine. Now weaned off vasopressors Secondary to bilateral lower extremity cellulitis and pneumonia.  Cultures with no growth - empiric vancomycin, azithromycin and ceftriaxone.    LOS: 2 Madison Mcbride 8/13/202110:40 AM

## 2020-02-23 NOTE — Progress Notes (Signed)
Writer in to check patient, she removed O2 [place] after therapy and writer rechecked on RA 96%.

## 2020-02-23 NOTE — ED Notes (Signed)
Pharmacy contacted for missing medications.

## 2020-02-23 NOTE — Evaluation (Signed)
Physical Therapy Evaluation Patient Details Name: Madison Mcbride MRN: [id number] DOB: April 20, 1956 Today's Date: 02/23/2020   History of Present Illness  Pt is a 64 y.o. female with medical history significant for morbid obesity, HTN, DM, hypothyroidism, diabetes, OSA, thyroid cancer, and CKD IIIb. Per MD impression, pt currently presents w/ severe sepsis, community acquired pneunomia, bilateral cellulitis of lower leg, acute respiratory failure w/ hypoxia, AKI, and acute metabolic encephalopathy.  Clinical Impression  Pt was pleasant and motivated to participate during the session. Pt found in supine and reported 5/10 BLE pain. At baseline, HR WFL and SpO2 between 84-87%; nurse notified and told to continue on room air. After ankle pumps, pt SpO2 desat to high 70s-low 80s; nurse notified and pt started on 2L O2. After deep breathing, pt's SpO2 increased back to ~88%. Pt continued w/ supine therex and SpO2 remained between high 80s-low 90s throughout; pt required min instructional cueing for supine therex as well as min-A for SLR. Pt required mod-A for supine-sit via HHA to help pull her trunk into full sitting position; SpO2 again desat to the low 80s but quickly returned to high 80s. Pt required mod-A from elevated surface to perform sit-to-stand; able to lift buttocks from bed and extend knees but unable to bring trunk into tall standing. Pt maintained trunk lean on RW in standing and was unable to bring hands to walker. Ambulation attempted, but pt only able to shuffle feet 1-2in before requesting return to sit; SpO2 remained in the high 80s. Pt required +2 mod/max-A for sit-to-supine for BLE management and repositioning w/ minimal ability to help reposition self. Pt left in supine, SpO2 and HR WFL at end of session. Pt will benefit from PT services in a SNF setting upon discharge to safely address deficits listed in patient problem list for decreased caregiver assistance and eventual return to  [facility].     Follow Up Recommendations SNF;Supervision/Assistance - 24 hour    Equipment Recommendations  Other (comment) (tbd at next venue of care)    Recommendations for Other Services       Precautions / Restrictions Precautions Precautions: Fall Restrictions Weight Bearing Restrictions: No      Mobility  Bed Mobility Overal bed mobility: Needs Assistance Bed Mobility: Supine to Sit;Sit to Supine     Supine to sit: Mod assist;HOB elevated Sit to supine: Mod assist;Max assist;+2 for physical assistance   General bed mobility comments: Pt required mod-A for trunk control w/ supine-sit w/ HHA. Pt required mod-max Ax2 for sit-to-supine for BLE management as well as repositioning. Minimal ability to reposition self in bed.  Transfers Overall transfer level: Needs assistance Equipment used: Rolling walker (2 wheeled) Transfers: Sit to/from Stand Sit to Stand: Mod assist;From elevated surface         General transfer comment: Pt unable to come to full stand; w/ mod-A from an elevated surface, pt was able to lift buttocks from bed and lean trunk on RW. Maintained arm lean and unable to bring hands to walker and bring self to full stand.  Ambulation/Gait             General Gait Details: side-step shuffling attempted; pt only able to shuffle feet maybe an inch or two before asking to return to sit  Stairs            Wheelchair Mobility    Modified Rankin (Stroke Patients Only)       Balance Overall balance assessment: Needs assistance Sitting-balance support: Feet supported Sitting balance-Leahy  Scale: Good       Standing balance-Leahy Scale: Poor Standing balance comment: unable to lift BLE from ground; maintained full trunk lean on RW                             Pertinent Vitals/Pain Pain Assessment: 0-10 Pain Score: 5  Pain Location: BLE Pain Intervention(s): Limited activity within patient's tolerance;Monitored during  session;Repositioned    Home Living Family/patient expects to be discharged to:: Private residence Living Arrangements: Spouse/significant other Available Help at Discharge: Family;Friend(s);Available 24 hours/day;Other (Comment) (husband employed, pt states she has a friend she can call during the day if she needs anything) Type of Home: House Home Access: Stairs to enter Entrance Stairs-Rails: Right;Left;Can reach both Entrance Stairs-Number of Steps: 5 Home Layout: One level Home Equipment: [place] - 4 wheels;Shower seat;Grab bars - toilet;Grab bars - tub/shower      Prior Function Level of Independence: Needs assistance         Comments: Pt currently uses a 4WW for household ambulation. Unable to walk community distances and requires a manual WC; does not own a WC so reliant on facility equipment; pt states ambulation is limited by fatigue and pain. Multiple falls in the past year and all have involved falling from chair or lift chair and requires EMS to get up from floor. Pt states that she was mostly independent up until a few weeks ago; husband now helps more w/ getting out of lift chair, walking, and sometimes w/ showering     Hand Dominance        Extremity/Trunk Assessment   Upper Extremity Assessment Upper Extremity Assessment: Generalized weakness    Lower Extremity Assessment Lower Extremity Assessment: Generalized weakness       Communication   Communication: No difficulties  Cognition Arousal/Alertness: Awake/alert Behavior During Therapy: WFL for tasks assessed/performed Overall Cognitive Status: Within Functional Limits for tasks assessed                                        General Comments      Exercises Total Joint Exercises Ankle Circles/Pumps: AROM;Strengthening;Both;10 reps Quad Sets: Strengthening;Both;10 reps Gluteal Sets: Strengthening;Both;10 reps Straight Leg Raises: AAROM;Strengthening;Both;10 reps Long Arc Quad:  AROM;Strengthening;Both;10 reps Knee Flexion: AROM;Strengthening;Both;10 reps   Assessment/Plan    PT Assessment Patient needs continued PT services  PT Problem List Decreased strength;Decreased activity tolerance;Decreased balance;Decreased knowledge of use of DME;Decreased mobility;Pain       PT Treatment Interventions DME instruction;Gait training;Stair training;Functional mobility training;Therapeutic activities;Patient/family education;Balance training;Therapeutic exercise    PT Goals (Current goals can be found in the Care Plan section)  Acute Rehab PT Goals Patient Stated Goal: to get stronger and walk w/o walker PT Goal Formulation: With patient Time For Goal Achievement: 03/07/20 Potential to Achieve Goals: Fair    Frequency Min 2X/week   Barriers to discharge        Co-evaluation               AM-PAC PT "6 Clicks" Mobility  Outcome Measure Help needed turning from your back to your side while in a flat bed without using bedrails?: A Lot Help needed moving from lying on your back to sitting on the side of a flat bed without using bedrails?: A Lot Help needed moving to and from a bed to a chair (including a wheelchair)?: A  Lot Help needed standing up from a chair using your arms (e.g., wheelchair or bedside chair)?: A Lot Help needed to walk in hospital room?: Total Help needed climbing 3-5 steps with a railing? : Total 6 Click Score: 10    End of Session Equipment Utilized During Treatment: Gait belt;Oxygen Activity Tolerance: Patient limited by fatigue Patient left: in bed;with call bell/phone within reach;with bed alarm set;with family/visitor present Nurse Communication: Mobility status; pt response to activity on room air and supplemental O2 PT Visit Diagnosis: Unsteadiness on feet (R26.81);Muscle weakness (generalized) (M62.81);Difficulty in walking, not elsewhere classified (R26.2);Pain Pain - Right/Left: Left (bilateral) Pain - part of body: Leg     Time: 3403-7096 PT Time Calculation (min) (ACUTE ONLY): 50 min   Charges:             Herbert Moors SPT 02/23/20, 5:27 PM

## 2020-02-23 NOTE — Progress Notes (Signed)
Writer was contacted by rehab when working with patient she dipped into the low/mid 80s a couple of times during more intense activities with rehab but was mostly in the upper 80s to low 90s. Placed her on 2L O2. Writer made MD aware of changes.

## 2020-02-23 NOTE — ED Notes (Signed)
Pt eating breakfast tray 

## 2020-02-23 NOTE — Progress Notes (Signed)
[facility] NAME: Mahiya Kercheval    MR#:  [id number]  DATE OF BIRTH:  07/28/1955  SUBJECTIVE:   Patient feels better. It is a bit uncomfortable in the bed. Did eat good breakfast. Overall improving. Complains of pain in her toes.  REVIEW OF SYSTEMS:   Review of Systems  Constitutional: Negative for chills, fever and weight loss.  HENT: Negative for ear discharge, ear pain and nosebleeds.   Eyes: Negative for blurred vision, pain and discharge.  Respiratory: Negative for sputum production, shortness of breath, wheezing and stridor.   Cardiovascular: Negative for chest pain, palpitations, orthopnea and PND.  Gastrointestinal: Negative for abdominal pain, diarrhea, nausea and vomiting.  Genitourinary: Negative for frequency and urgency.  Musculoskeletal: Positive for back pain and joint pain.  Skin:       Redness both le  Neurological: Positive for weakness. Negative for sensory change, speech change and focal weakness.  Psychiatric/Behavioral: Negative for depression and hallucinations. The patient is not nervous/anxious.    Tolerating Diet:yes Tolerating PT: pending  DRUG ALLERGIES:  No Known Allergies  VITALS:  Blood pressure 111/63, pulse 69, temperature 98.2 F (36.8 C), temperature source Oral, resp. rate 19, height 5\' 5"  (1.651 m), weight (!) 145.2 kg, SpO2 93 %.  PHYSICAL EXAMINATION:   Physical Exam  GENERAL:  64 y.o.-year-old patient lying in the bed with no acute distress. OBESE EYES: Pupils equal, round, reactive to light and accommodation. No scleral icterus.   HEENT: Head atraumatic, normocephalic. Oropharynx and nasopharynx clear.  NECK:  Supple, no jugular venous distention. No thyroid enlargement, no tenderness.  LUNGS: DECREASED breath sounds bilaterally, no wheezing, rales, rhonchi. No use of accessory muscles of respiration.  CARDIOVASCULAR: S1, S2 normal. No murmurs, rubs, or gallops.  ABDOMEN: Soft,  nontender, nondistended. Bowel sounds present. No organomegaly or mass. Foley+ EXTREMITIES: lef hand/wrist puffy with edema ?IV infiltration  Above on admssion 8/11- 8/12 appears stable 8/13--fading redness   NEUROLOGIC: Cranial nerves II through XII are intact. No focal Motor or sensory deficits b/l.  Weak, deconditioned PSYCHIATRIC:  patient is alert and oriented x 3.  SKIN: AS above  LABORATORY PANEL:  CBC Recent Labs  Lab 02/23/20 0728  WBC 15.5*  HGB 8.8*  HCT 29.4*  PLT 367    Chemistries  Recent Labs  Lab 02/23/20 0728  NA 140  K 3.8  CL 101  CO2 27  GLUCOSE 163*  BUN 62*  CREATININE 1.83*  CALCIUM 7.9*  AST 53*  ALT 32  ALKPHOS 121  BILITOT 0.7   Cardiac Enzymes No results for input(s): TROPONINI in the last 168 hours. RADIOLOGY:  US RENAL  Result Date: 02/21/2020 CLINICAL DATA:  Acute kidney injury EXAM: RENAL / URINARY TRACT ULTRASOUND COMPLETE COMPARISON:  None. FINDINGS: Right Kidney: Renal measurements: 10.2 x 6.2 x 5.9 cm = volume: 192 mL. Small cyst in the upper pole measuring 13 mm. Normal echotexture. Mild cortical thinning. No hydronephrosis. Left Kidney: Renal measurements: 10.2 x 5.8 x 6.4 cm = volume: 199 mL. Cortical thinning. Normal echotexture. No hydronephrosis. Small echogenic areas within the left kidney may reflect nonobstructing stones. Bladder: Appears normal for degree of bladder distention. Other: None. IMPRESSION: Mild cortical thinning.  No acute findings.  No hydronephrosis. Suspect left nephrolithiasis. Electronically Signed   By: Rolm Baptise M.D.   On: 02/21/2020 17:58   ECHOCARDIOGRAM COMPLETE  Result Date: 02/22/2020    ECHOCARDIOGRAM REPORT   Patient Name:  [place] Date of Exam: 02/21/2020 Medical Rec #:  [id number]             Height:       65.0 in Accession #:    [id number]            Weight:       320.0 lb Date of Birth:  [birth year]             BSA:          2.415 m Patient Age:    64 years              BP:            95/49 mmHg Patient Gender: F                     HR:           62 bpm. Exam Location:  [facility] Procedure: 2D Echo Indications:     Abnormal ECG R94.31  History:         Patient has no prior history of Echocardiogram examinations.  Sonographer:     Arville Go RDCS Referring Phys:  [place] Diagnosing Phys: Serafina Royals MD  Sonographer Comments: Technically challenging study due to limited acoustic windows, Technically difficult study due to poor echo windows and no subcostal window. Image acquisition challenging due to patient body habitus. IMPRESSIONS  1. Left ventricular ejection fraction, by estimation, is 50 to 55%. The left ventricle has low normal function. The left ventricle has no regional wall motion abnormalities. Left ventricular diastolic parameters were normal.  2. Right ventricular systolic function is normal. The right ventricular size is normal.  3. Left atrial size was mildly dilated.  4. The mitral valve is normal in structure. Mild to moderate mitral valve regurgitation.  5. The aortic valve is normal in structure. Aortic valve regurgitation is mild.  6. Aortic dilatation noted. 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 FINDINGS  Left Ventricle: Left ventricular ejection fraction, by estimation, is 50 to 55%. The left ventricle has low normal function. The left ventricle has no regional wall motion abnormalities. The left ventricular internal cavity size was normal in size. There is no left ventricular hypertrophy. Left ventricular diastolic parameters were normal. Right Ventricle: The right ventricular size is normal. No increase in right ventricular wall thickness. Right ventricular systolic function is normal. Left Atrium: Left atrial size was mildly dilated. Right Atrium: Right atrial size was normal in size. Pericardium: There is no evidence of pericardial effusion. Mitral Valve: The mitral valve is normal in structure. Mild to moderate mitral valve regurgitation.  Tricuspid Valve: The tricuspid valve is normal in structure. Tricuspid valve regurgitation is mild. Aortic Valve: The aortic valve is normal in structure. Aortic valve regurgitation is mild. Aortic valve peak gradient measures 9.6 mmHg. Pulmonic Valve: The pulmonic valve was normal in structure. Pulmonic valve regurgitation is not visualized. Aorta: Aortic dilatation noted. There is an aneurysm involving the aortic root and ascending aorta. IAS/Shunts: No atrial level shunt detected by color flow Doppler.  LEFT VENTRICLE PLAX 2D LVIDd:         5.81 cm  Diastology LVIDs:         4.05 cm  LV e' lateral:   6.53 cm/s LV PW:         1.20 cm  LV E/e' lateral: 12.8 LV IVS:        1.33 cm  LV e' medial:  5.44 cm/s LVOT diam:     2.10 cm  LV E/e' medial:  15.4 LV SV:         49 LV SV Index:   20 LVOT Area:     3.46 cm  RIGHT VENTRICLE RV Basal diam:  4.05 cm RV S prime:     12.80 cm/s TAPSE (M-mode): 2.3 cm LEFT ATRIUM           Index       RIGHT ATRIUM           Index LA diam:      2.70 cm 1.12 cm/m  RA Area:     18.20 cm LA Vol (A4C): 50.8 ml 21.03 ml/m RA Volume:   52.90 ml  21.90 ml/m  AORTIC VALVE                PULMONIC VALVE AV Area (Vmax): 1.47 cm    PV Vmax:       1.41 m/s AV Vmax:        155.00 cm/s PV Peak grad:  8.0 mmHg AV Peak Grad:   9.6 mmHg LVOT Vmax:      65.90 cm/s LVOT Vmean:     43.300 cm/s LVOT VTI:       0.141 m  AORTA Ao Root diam: 5.10 cm Ao Asc diam:  4.10 cm MITRAL VALVE               TRICUSPID VALVE MV Area (PHT): 4.15 cm    TV Peak grad:   34.9 mmHg MV Decel Time: 183 msec    TV Vmax:        2.96 m/s MV E velocity: 83.60 cm/s MV A velocity: 75.80 cm/s  SHUNTS MV E/A ratio:  1.10        Systemic VTI:  0.14 m                            Systemic Diam: 2.10 cm Serafina Royals MD Electronically signed by Serafina Royals MD Signature Date/Time: 02/22/2020/8:58:54 AM    Final    ASSESSMENT AND PLAN:  64 year old female with history of HTN, hypothyroidism, diabetes, OSA who presents to the  emergency room presenting initially with bilateral leg pain becoming unresponsive, hypotensive and hypoxic while in the ER Chest x-ray consistent with pneumonia, urinalysis unremarkable.  Severe sepsis ([place]) POA   CAP (community acquired pneumonia)   Bilateral cellulitis of lower leg -Borderline severe sepsis criteria with leukocytosis without fever or elevated lactic acid level but had multi organ derangement with hypotension, AKI, acute encephalopathy and hypoxia with pneumonia on chest x-ray and possible lower extremity cellulitis a -sepsis improving -now off IV Norepinephrine --IV solucortef 50 mg IV (started 8/11)--now d/ced -- received IV hydration -cont IV Rocephin  And azithromycin  -Supplemental oxygen --procalcitonin 2.03-- 1.66 -UA negative -BC negative -MRSA pCR neg -COVID negative  Leucocytosis Came in wbc of 20K--19K-- 15 K -BC so far negative   Acute respiratory failure with hypoxia (HCC) suspected due to pneumonia -Supplemental oxygen to keep sats over 92% -Patient had bilateral lower extremity Dopplers that were negative for acute DVT -cont PRN bronchodilators and try to wean off oxygen when able to  Bilateral lower extremity pain -Lower extremity Doppler negative, suspect related to cellulitis and small baker cysts left knee  AKI (acute kidney injury) ([place]) appears ATN with sepsis -baseline creat 1.1 --came in with creat 1.57--IVF --2.33--2.35--1.6 -- nephrology consultation with Dr. Juleen China  appreciated  Relative hypotension in the setting of sepsis -patient is now off IV pressure -resume blood pressure meds at discharge depending on blood pressure readings.    Acute metabolic encephalopathy--improved -Related to above -Fall and aspiration precautions -hold sedating meds    Type 2 diabetes mellitus with peripheral neuropathy  -Sliding scale insulin coverage -A1c 7.4% On March 2021 -resume Requip and gabapentin - pt takes Ozempic at home     Hypothyroidism -Continue home meds    Morbid obesity with BMI of 50.0-59.9, adult ([place]) - complicates overall prognosis and care  Generalized deconditioning. Physical therapy to see patient.  Foley catheter needs to be removed. Patient wants to keep it for one more day said she is very weak unable to get out of bed. Please DC Foley tomorrow 8/14    DVT prophylaxis: Lovenox  Code Status: full code  Family Communication:   husband in the room Disposition Plan: TBD--pending PT evaluation Consults called: none  Status:At the time of admission, it appears that the appropriate admission status for this patient is INPATIENT. Patient is being treated for sepsis secondary to cellulitis and pneumonia she is acute renal failure as well overall improving. Needs physical therapy and TOC for discharge planning   TOTAL TIME TAKING CARE OF THIS PATIENT: *25* minutes.  >50% time spent on counselling and coordination of care  Note: This dictation was prepared with Dragon dictation along with smaller phrase technology. Any transcriptional errors that result from this process are unintentional.  Fritzi Mandes M.D    [facility]   CC: Primary care physician; Rusty Aus, MDPatient ID: [name], female   DOB: [birth year], 64 y.o.   MRN: [id number]

## 2020-02-24 LAB — BLOOD GAS, ARTERIAL
Acid-Base Excess: 7.5 mmol/L — ABNORMAL HIGH (ref 0.0–2.0)
Allens test (pass/fail): POSITIVE — AB
Bicarbonate: 32.6 mmol/L — ABNORMAL HIGH (ref 20.0–28.0)
FIO2: 0.32
O2 Saturation: 97 %
Patient temperature: 37
pCO2 arterial: 48 mmHg (ref 32.0–48.0)
pH, Arterial: 7.44 (ref 7.350–7.450)
pO2, Arterial: 87 mmHg (ref 83.0–108.0)

## 2020-02-24 LAB — GLUCOSE, CAPILLARY
Glucose-Capillary: 94 mg/dL (ref 70–99)
Glucose-Capillary: 89 mg/dL (ref 70–99)
Glucose-Capillary: 92 mg/dL (ref 70–99)
Glucose-Capillary: 119 mg/dL — ABNORMAL HIGH (ref 70–99)
Glucose-Capillary: 100 mg/dL — ABNORMAL HIGH (ref 70–99)

## 2020-02-24 MED ORDER — GABAPENTIN 100 MG PO CAPS
100.0000 mg | ORAL_CAPSULE | Freq: Two times a day (BID) | ORAL | Status: DC
  Administered 2020-02-25: 100 mg via ORAL
  Filled 2020-02-24: qty 1

## 2020-02-24 NOTE — NC FL2 (Addendum)
[place] LEVEL OF CARE SCREENING TOOL     IDENTIFICATION  Patient Name: Madison Mcbride Birthdate: Apr 02, 1956 Sex: female Admission Date (Current Location): 02/20/2020  [place] and [place] Number:  Engineering geologist and Address:  [facility], [address] [place], [place] [address]      Provider Number: [id number]  Attending Physician Name and Address:  Kayleen Memos, DO  Relative Name and Phone Number:  [name], Raechal Raben ([phone])    Current Level of Care: Hospital Recommended Level of Care: [place] Prior Approval Number:    Date Approved/Denied:   PASRR Number:   [id number]  Discharge Plan: SNF    Current Diagnoses: Patient Active Problem List   Diagnosis Date Noted  . Type 2 diabetes mellitus without complication ([place]) 76/81/1572  . Hypothyroidism 02/21/2020  . Morbid obesity with BMI of 50.0-59.9, adult ([place]) 02/21/2020  . Severe sepsis ([place]) 02/21/2020  . CAP (community acquired pneumonia) 02/21/2020  . Bilateral cellulitis of lower leg 02/21/2020  . Acute respiratory failure with hypoxia ([place]) 02/21/2020  . AKI (acute kidney injury) ([place]) 02/21/2020  . Acute metabolic encephalopathy 62/09/5595  . Sepsis ([place]) 02/21/2020    Orientation RESPIRATION BLADDER Height & Weight     Self, Time, Situation, Place  Normal Continent Weight: (!) 320 lb (145.2 kg) Height:  5\' 5"  (165.1 cm)  BEHAVIORAL SYMPTOMS/MOOD NEUROLOGICAL BOWEL NUTRITION STATUS      Continent Diet  AMBULATORY STATUS COMMUNICATION OF NEEDS Skin   Limited Assist Verbally Other (Comment) (redness on both legs)                       Personal Care Assistance Level of Assistance  Bathing Bathing Assistance: Limited assistance         Functional Limitations Info             SPECIAL CARE FACTORS FREQUENCY                       Contractures Contractures Info: Not present    Additional  Factors Info  Code Status Code Status Info: FULL             Current Medications (02/24/2020):  This is the current hospital active medication list Current Facility-Administered Medications  Medication Dose Route Frequency Provider Last Rate Last Admin  . 0.9 %  sodium chloride infusion  250 mL Intravenous Continuous Awilda Bill, NP   Stopped at 02/22/20 0732  . acetaminophen (TYLENOL) tablet 650 mg  650 mg Oral Q6H PRN Athena Masse, MD   650 mg at 02/23/20 0257   Or  . acetaminophen (TYLENOL) suppository 650 mg  650 mg Rectal Q6H PRN Athena Masse, MD      . atorvastatin (LIPITOR) tablet 20 mg  20 mg Oral Q2200 Fritzi Mandes, MD   20 mg at 02/23/20 2155  . azithromycin (ZITHROMAX) 500 mg in sodium chloride 0.9 % 250 mL IVPB  500 mg Intravenous Q24H Judd Gaudier V, MD 250 mL/hr at 02/24/20 0543 500 mg at 02/24/20 0543  . budesonide (PULMICORT) nebulizer solution 0.5 mg  0.5 mg Nebulization BID Awilda Bill, NP   0.5 mg at 02/24/20 0809  . cefTRIAXone (ROCEPHIN) 2 g in sodium chloride 0.9 % 100 mL IVPB  2 g Intravenous Q24H Judd Gaudier V, MD 200 mL/hr at 02/24/20 0554 2 g at 02/24/20 0554  . Chlorhexidine Gluconate Cloth 2 % PADS  6 each  6 each Topical Daily Fritzi Mandes, MD      . diclofenac Sodium (VOLTAREN) 1 % topical gel 2 g  2 g Topical QID Oswald Hillock, RPH   2 g at 02/24/20 1761  . enoxaparin (LOVENOX) injection 40 mg  40 mg Subcutaneous BID Athena Masse, MD   40 mg at 02/24/20 0924  . gabapentin (NEURONTIN) capsule 300 mg  300 mg Oral BID Fritzi Mandes, MD   300 mg at 02/24/20 0925  . insulin aspart (novoLOG) injection 0-20 Units  0-20 Units Subcutaneous TID WC Athena Masse, MD   3 Units at 02/23/20 1206  . insulin aspart (novoLOG) injection 0-5 Units  0-5 Units Subcutaneous QHS Athena Masse, MD   4 Units at 02/22/20 2326  . ipratropium-albuterol (DUONEB) 0.5-2.5 (3) MG/3ML nebulizer solution 3 mL  3 mL Nebulization Q6H PRN Awilda Bill, NP   3 mL at  02/21/20 1605  . levothyroxine (SYNTHROID) tablet 250 mcg  250 mcg Oral q morning - 10a Fritzi Mandes, MD   250 mcg at 02/24/20 [phone]  . lidocaine (LIDODERM) 5 % 1 patch  1 patch Transdermal Daily Fritzi Mandes, MD   1 patch at 02/24/20 0925  . multivitamin with minerals tablet 1 tablet  1 tablet Oral Daily Fritzi Mandes, MD   1 tablet at 02/24/20 0925  . ondansetron (ZOFRAN) tablet 4 mg  4 mg Oral Q6H PRN Athena Masse, MD       Or  . ondansetron [facility]) injection 4 mg  4 mg Intravenous Q6H PRN Athena Masse, MD      . rOPINIRole (REQUIP) tablet 3 mg  3 mg Oral TID Fritzi Mandes, MD   3 mg at 02/24/20 0925  . traMADol (ULTRAM) tablet 50 mg  50 mg Oral Q6H PRN Fritzi Mandes, MD         Discharge Medications: Please see discharge summary for a list of discharge medications.  Relevant Imaging Results:  Relevant Lab Results:   Additional Information SS#: [id number]  Trecia Rogers, LCSW

## 2020-02-24 NOTE — Progress Notes (Signed)
PROGRESS NOTE  Madison Mcbride ZOX:[id number] DOB: Nov 26, 1955 DOA: 02/20/2020 PCP: Rusty Aus, MD  HPI/Recap of past 35 hours: 64 year old female with history of HTN, hypothyroidism, diabetes, OSA who presents to the emergency room presenting initially with bilateral leg pain becoming unresponsive, hypotensive and hypoxic while in the ER Chest x-ray consistent with pneumonia, urinalysis unremarkable.  02/24/20: Seen and examined.  Still feels weak.  Bilateral lower extremity tenderness improved since admission.  Currently on IV antibiotics empirically for community-acquired pneumonia and bilateral cellulitis of lower extremities.  Assessment/Plan: Principal Problem:   Severe sepsis ([place]) Active Problems:   Type 2 diabetes mellitus without complication (HCC)   Hypothyroidism   Morbid obesity with BMI of 50.0-59.9, adult ([place])   CAP (community acquired pneumonia)   Bilateral cellulitis of lower leg   Acute respiratory failure with hypoxia (HCC)   AKI (acute kidney injury) ([place])   Acute metabolic encephalopathy   Sepsis ([place])   Septic shock, shock has resolved, secondary to CAP and B/L lower extremity cellulitis, POA  Now off Vasopressors Maintaining MAP greater than 65 Completed 5 days of azithromycin Currently on ceftriaxone day #5 Procalcitonin downtrending 1.4 from 2.2 Repeat procalcitonin level in the morning, repeat CBC with differential in the AM Monitor fever curve and WBC  Acute respiratory failure with hypoxia ([place]) suspected due to CAP, poa Currently O2 saturation 90% on room air Home O2 evaluation for DC planning Continue bronchodilators Reviewed chest x-ray which showed increase in pulmonary vascularity suggestive of pulmonary edema, she is on p.o. Lasix prior to admission, held due to AKI. Start incentive spirometer and flutter valve Maintain O2 saturation greater than 90%  Bilateral lower extremity cellulitis Clinically improving -Lower extremity  Doppler negative for DVT Continue empiric antibiotics  AKI (acute kidney injury) ([place]) appears ATN with sepsis -baseline creat 1.1 --came in with creat 1.57--IVF --2.33--2.35--1.6 -- nephrology consultation with Dr. Juleen China appreciated -Creatinine improving, 1.8 from 2.3 Continue to monitor with pulmonology Monitor urine output Repeat BMP in the morning  Acute metabolic encephalopathy-resolved -Related to above -Fall and aspiration precautions  Type 2 diabetes mellitus with hyperglycemia and peripheral neuropathy  -Sliding scale insulin coverage -A1c 7.4% On March 2021 -resume Requip and gabapentin - pt takes Ozempic at home  Hypothyroidism TSH 0.7 -Continue home levothyroxine  Morbid obesity with BMI of 50.0-59.9, adult ([place]) - complicates overall prognosis and care  Generalized deconditioning. PT  Fall precautions  Acute urinary retention Continue Foley catheter Voiding trial    DVT prophylaxis:Lovenox subcu daily Code Status:full code Family Communication:  Updated husband at bedside.  Consults called:Nephrology    Status is: Inpatient    Dispo: The patient is from: Home.              Anticipated d/c is to: Home with home health services or SNF              Anticipated d/c date is: 02/26/2020              Patient currently not stable for discharge due to ongoing treatment for community-acquired pneumonia, management of acute hypoxic respiratory failure.       Objective: Vitals:   02/23/20 2234 02/24/20 0258 02/24/20 0832 02/24/20 1245  BP: 124/73 116/62 (!) 118/59   Pulse: 78 90 96   Resp: 17 17    Temp: 98 F (36.7 C) 98.4 F (36.9 C) 98.7 F (37.1 C)   TempSrc: Oral Oral Oral   SpO2: 94% 91% 90% 92%  Weight:  Height:        Intake/Output Summary (Last 24 hours) at 02/24/2020 1400 Last data filed at 02/24/2020 1345 Gross per 24 hour  Intake 1824.86 ml  Output 2801 ml  Net -976.14 ml   Filed Weights    02/20/20 1914 02/22/20 0730  Weight: (!) 145.2 kg (!) 145.2 kg    Exam:  . General: 64 y.o. year-old female well developed well nourished in no acute distress.  Alert and oriented x3. . Cardiovascular: Regular rate and rhythm with no rubs or gallops.  No thyromegaly or JVD noted.   [name] Respiratory: Mild rales at bases.  No wheezing noted.  Good respiratory effort.   . Abdomen: Soft nontender nondistended with normal bowel sounds x4 quadrants. . Musculoskeletal: Trace lower extremity edema bilaterally.  Erythema, warmth and tenderness improved. [name] Psychiatry: Mood is appropriate for condition and setting   Data Reviewed: CBC: Recent Labs  Lab 02/20/20 2128 02/21/20 0949 02/22/20 0609 02/23/20 0728  WBC 19.3* 20.8* 19.1* 15.5*  NEUTROABS 16.3*  --   --   --   HGB 11.0* 9.1* 10.3* 8.8*  HCT 36.6 31.7* 33.3* 29.4*  MCV 91.5 95.8 91.2 93.6  PLT 407* 397 395 431   Basic Metabolic Panel: Recent Labs  Lab 02/20/20 2128 02/21/20 0949 02/22/20 0609 02/23/20 0728  NA 137  --  139 140  K 4.5  --  4.6 3.8  CL 95*  --  100 101  CO2 29  --  26 27  GLUCOSE 115*  --  159* 163*  BUN 48*  --  61* 62*  CREATININE 1.57* 2.33* 2.35* 1.83*  CALCIUM 8.9  --  8.1* 7.9*  PHOS  --   --   --  4.2   GFR: Estimated Creatinine Clearance: 44.7 mL/min (A) (by C-G formula based on SCr of 1.83 mg/dL (H)). Liver Function Tests: Recent Labs  Lab 02/20/20 2128 02/23/20 0728  AST 47* 53*  ALT 26 32  ALKPHOS 119 121  BILITOT 1.8* 0.7  PROT 7.8 6.8  ALBUMIN 2.6* 2.4*   No results for input(s): LIPASE, AMYLASE in the last 168 hours. Recent Labs  Lab 02/21/20 1645  AMMONIA 32   Coagulation Profile: Recent Labs  Lab 02/22/20 0609  INR 1.2   Cardiac Enzymes: No results for input(s): CKTOTAL, CKMB, CKMBINDEX, TROPONINI in the last 168 hours. BNP (last 3 results) No results for input(s): PROBNP in the last 8760 hours. HbA1C: No results for input(s): HGBA1C in the last 72  hours. CBG: Recent Labs  Lab 02/23/20 1152 02/23/20 1542 02/23/20 2101 02/24/20 0836 02/24/20 1136  GLUCAP 136* 72 77 94 89   Lipid Profile: No results for input(s): CHOL, HDL, LDLCALC, TRIG, CHOLHDL, LDLDIRECT in the last 72 hours. Thyroid Function Tests: Recent Labs    02/21/20 1645 02/23/20 0728  TSH  --  0.762  FREET4 1.31*  --    Anemia Panel: No results for input(s): VITAMINB12, FOLATE, FERRITIN, TIBC, IRON, RETICCTPCT in the last 72 hours. Urine analysis:    Component Value Date/Time   COLORURINE YELLOW (A) 02/21/2020 0342   APPEARANCEUR HAZY (A) 02/21/2020 0342   APPEARANCEUR CLOUDY 01/15/2014 0209   LABSPEC 1.010 02/21/2020 0342   LABSPEC 1.014 01/15/2014 0209   PHURINE 6.0 02/21/2020 0342   GLUCOSEU NEGATIVE 02/21/2020 0342   GLUCOSEU NEGATIVE 01/15/2014 0209   HGBUR NEGATIVE 02/21/2020 0342   BILIRUBINUR NEGATIVE 02/21/2020 0342   BILIRUBINUR NEGATIVE 01/15/2014 [place] NEGATIVE 02/21/2020 0342   PROTEINUR  NEGATIVE 02/21/2020 0342   NITRITE NEGATIVE 02/21/2020 0342   LEUKOCYTESUR NEGATIVE 02/21/2020 0342   LEUKOCYTESUR 2+ 01/15/2014 0209   Sepsis Labs: @LABRCNTIP (procalcitonin:4,lacticidven:4)  ) Recent Results (from the past 240 hour(s))  Blood culture (routine x 2)     Status: None (Preliminary result)   Collection Time: 02/21/20  2:30 AM   Specimen: BLOOD  Result Value Ref Range Status   Specimen Description BLOOD LEFT FA  Final   Special Requests   Final    BOTTLES DRAWN AEROBIC AND ANAEROBIC Blood Culture adequate volume   Culture   Final    NO GROWTH 3 DAYS Performed at [facility], [address] [place], [place] [address]    Report Status PENDING  Incomplete  Blood culture (routine x 2)     Status: None (Preliminary result)   Collection Time: 02/21/20  2:30 AM   Specimen: BLOOD  Result Value Ref Range Status   Specimen Description BLOOD RIGHT FA  Final   Special Requests   Final    BOTTLES DRAWN AEROBIC AND  ANAEROBIC Blood Culture adequate volume   Culture   Final    NO GROWTH 3 DAYS Performed at [facility], [address], [place], [place] [address]    Report Status PENDING  Incomplete  SARS Coronavirus 2 by RT PCR (hospital order, performed in [place] hospital lab) Nasopharyngeal Nasopharyngeal Swab     Status: None   Collection Time: 02/21/20  2:30 AM   Specimen: Nasopharyngeal Swab  Result Value Ref Range Status   SARS Coronavirus 2 NEGATIVE NEGATIVE Final    Comment: (NOTE) SARS-CoV-2 target nucleic acids are NOT DETECTED.  The SARS-CoV-2 RNA is generally detectable in upper and lower respiratory specimens during the acute phase of infection. The lowest concentration of SARS-CoV-2 viral copies this assay can detect is 250 copies / mL. A negative result does not preclude SARS-CoV-2 infection and should not be used as the sole basis for treatment or other patient management decisions.  A negative result may occur with improper specimen collection / handling, submission of specimen other than nasopharyngeal swab, presence of viral mutation(s) within the areas targeted by this assay, and inadequate number of viral copies (<250 copies / mL). A negative result must be combined with clinical observations, patient history, and epidemiological information.  Fact Sheet for Patients:   StrictlyIdeas.no  Fact Sheet for Healthcare Providers: BankingDealers.co.za  This test is not yet approved or  cleared by the [place] FDA and has been authorized for detection and/or diagnosis of SARS-CoV-2 by FDA under an Emergency Use Authorization (EUA).  This EUA will remain in effect (meaning this test can be used) for the duration of the COVID-19 declaration under Section 564(b)(1) of the Act, 21 U.S.C. section 360bbb-3(b)(1), unless the authorization is terminated or revoked sooner.  Performed at [facility], [address] [place], [place] [address]   Culture, blood (single)     Status: None (Preliminary result)   Collection Time: 02/21/20  9:35 AM   Specimen: BLOOD  Result Value Ref Range Status   Specimen Description BLOOD BLOOD RIGHT HAND  Final   Special Requests   Final    BOTTLES DRAWN AEROBIC ONLY Blood Culture results may not be optimal due to an inadequate volume of blood received in culture bottles   Culture   Final    NO GROWTH 3 DAYS Performed at [facility], [address] [place], [place] [address]    Report  Status PENDING  Incomplete  MRSA PCR Screening     Status: None   Collection Time: 02/21/20  4:14 PM   Specimen: Nasal Mucosa; Nasopharyngeal  Result Value Ref Range Status   MRSA by PCR NEGATIVE NEGATIVE Final    Comment:        The GeneXpert MRSA Assay (FDA approved for NASAL specimens only), is one component of a comprehensive MRSA colonization surveillance program. It is not intended to diagnose MRSA infection nor to guide or monitor treatment for MRSA infections. Performed at [facility], [address] [place], [place] [address]       Studies: No results found.  Scheduled Meds: . atorvastatin  20 mg Oral Q2200  . budesonide (PULMICORT) nebulizer solution  0.5 mg Nebulization BID  . Chlorhexidine Gluconate Cloth  6 each Topical Daily  . diclofenac Sodium  2 g Topical QID  . enoxaparin (LOVENOX) injection  40 mg Subcutaneous BID  . gabapentin  300 mg Oral BID  . insulin aspart  0-20 Units Subcutaneous TID WC  . insulin aspart  0-5 Units Subcutaneous QHS  . levothyroxine  250 mcg Oral q morning - 10a  . lidocaine  1 patch Transdermal Daily  . multivitamin with minerals  1 tablet Oral Daily  . rOPINIRole  3 mg Oral TID    Continuous Infusions: . sodium chloride Stopped (02/22/20 0732)  . azithromycin 500 mg (02/24/20 0543)  . cefTRIAXone (ROCEPHIN)  IV 2 g (02/24/20 0554)     LOS: 3 days     Kayleen Memos, MD [facility] Pager [phone]  If 7PM-7AM, please contact night-coverage [email or web address] Password [email or web address] 02/24/2020, 2:00 PM

## 2020-02-25 LAB — COMPREHENSIVE METABOLIC PANEL
ALT: 28 U/L (ref 0–44)
AST: 36 U/L (ref 15–41)
Albumin: 2.5 g/dL — ABNORMAL LOW (ref 3.5–5.0)
Alkaline Phosphatase: 112 U/L (ref 38–126)
Anion gap: 11 (ref 5–15)
BUN: 38 mg/dL — ABNORMAL HIGH (ref 8–23)
CO2: 30 mmol/L (ref 22–32)
Calcium: 8.7 mg/dL — ABNORMAL LOW (ref 8.9–10.3)
Chloride: 104 mmol/L (ref 98–111)
Creatinine, Ser: 1.11 mg/dL — ABNORMAL HIGH (ref 0.44–1.00)
GFR calc Af Amer: >60 mL/min (ref >60)
GFR calc non Af Amer: 52 mL/min — ABNORMAL LOW (ref >60)
Glucose, Bld: 104 mg/dL — ABNORMAL HIGH (ref 70–99)
Potassium: 4.3 mmol/L (ref 3.5–5.1)
Sodium: 145 mmol/L (ref 135–145)
Total Bilirubin: 0.8 mg/dL (ref 0.3–1.2)
Total Protein: 6.6 g/dL (ref 6.5–8.1)

## 2020-02-25 LAB — CBC WITH DIFFERENTIAL/PLATELET
Abs Immature Granulocytes: 0.11 10*3/uL — ABNORMAL HIGH (ref 0.00–0.07)
Basophils Absolute: 0 10*3/uL (ref 0.0–0.1)
Basophils Relative: 0 %
Eosinophils Absolute: 0.1 10*3/uL (ref 0.0–0.5)
Eosinophils Relative: 1 %
HCT: 32.3 % — ABNORMAL LOW (ref 36.0–46.0)
Hemoglobin: 9.8 g/dL — ABNORMAL LOW (ref 12.0–15.0)
Immature Granulocytes: 1 %
Lymphocytes Relative: 11 %
Lymphs Abs: 1.4 10*3/uL (ref 0.7–4.0)
MCH: 27.5 pg (ref 26.0–34.0)
MCHC: 30.3 g/dL (ref 30.0–36.0)
MCV: 90.7 fL (ref 80.0–100.0)
Monocytes Absolute: 1.1 10*3/uL — ABNORMAL HIGH (ref 0.1–1.0)
Monocytes Relative: 8 %
Neutro Abs: 10.5 10*3/uL — ABNORMAL HIGH (ref 1.7–7.7)
Neutrophils Relative %: 79 %
Platelets: 371 10*3/uL (ref 150–400)
RBC: 3.56 MIL/uL — ABNORMAL LOW (ref 3.87–5.11)
RDW: 17.2 % — ABNORMAL HIGH (ref 11.5–15.5)
WBC: 13.3 10*3/uL — ABNORMAL HIGH (ref 4.0–10.5)
nRBC: 0.1 % (ref 0.0–0.2)

## 2020-02-25 LAB — MAGNESIUM: Magnesium: 2.1 mg/dL (ref 1.7–2.4)

## 2020-02-25 LAB — GLUCOSE, CAPILLARY
Glucose-Capillary: 105 mg/dL — ABNORMAL HIGH (ref 70–99)
Glucose-Capillary: 127 mg/dL — ABNORMAL HIGH (ref 70–99)
Glucose-Capillary: 110 mg/dL — ABNORMAL HIGH (ref 70–99)

## 2020-02-25 LAB — PROCALCITONIN: Procalcitonin: 0.55 ng/mL

## 2020-02-25 LAB — PHOSPHORUS: Phosphorus: 3 mg/dL (ref 2.5–4.6)

## 2020-02-25 MED ORDER — GABAPENTIN 100 MG PO CAPS
200.0000 mg | ORAL_CAPSULE | Freq: Three times a day (TID) | ORAL | Status: DC
  Administered 2020-02-25 – 2020-03-01 (×9): 200 mg via ORAL
  Filled 2020-02-25 (×9): qty 2

## 2020-02-25 MED ORDER — FUROSEMIDE 10 MG/ML IJ SOLN
20.0000 mg | Freq: Three times a day (TID) | INTRAMUSCULAR | Status: DC
  Administered 2020-02-25 (×4): 20 mg via INTRAVENOUS
  Filled 2020-02-25 (×3): qty 4

## 2020-02-25 NOTE — Progress Notes (Signed)
PROGRESS NOTE  Madison Mcbride WLN:[id number] DOB: 09-Nov-1955 DOA: 02/20/2020 PCP: Rusty Aus, MD  HPI/Recap of past 12 hours: 64 year old female with history of HTN, hypothyroidism, diabetes, OSA who presents to the emergency room presenting initially with bilateral leg pain becoming unresponsive, hypotensive and hypoxic while in the ER Chest x-ray consistent with pneumonia, urinalysis unremarkable.  02/25/20: Seen and examined.  Still feels weak.  Was very drowsy yesterday on high doses of gabapentin.  Doses have been reduced.  More alert this morning.    Assessment/Plan: Principal Problem:   Severe sepsis ([place]) Active Problems:   Type 2 diabetes mellitus without complication (HCC)   Hypothyroidism   Morbid obesity with BMI of 50.0-59.9, adult ([place])   CAP (community acquired pneumonia)   Bilateral cellulitis of lower leg   Acute respiratory failure with hypoxia (HCC)   AKI (acute kidney injury) ([place])   Acute metabolic encephalopathy   Sepsis ([place])   Septic shock, shock has resolved, secondary to CAP and B/L lower extremity cellulitis, POA  Now off Vasopressors Maintaining MAP greater than 65 Completed 5 days of azithromycin Completed ceftriaxone day #6/6 Procalcitonin downtrending 0.55 from 1.4 from 2.2 Leukocytosis improving, afebrile  Acute respiratory failure with hypoxia ([place]) suspected due to CAP, poa Currently O2 saturation 90% on room air Home O2 evaluation for DC planning Continue bronchodilators, incentive spirometer and flutter valve. 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  Hypothyroidism TSH 0.7 -Continue home levothyroxine  Morbid obesity with BMI of 50.0-59.9, adult ([place]) - complicates overall prognosis and care  Generalized deconditioning. PT recommended SNF TOC consulted to assist with SNF placement. Continue fall precautions  Acute urinary retention Foley catheter removed on 02/24/2020 2.5 L urine output recorded in the last 24 hours.    DVT prophylaxis:Lovenox subcu daily Code Status:full code Family Communication:  Updated husband at her bedside.  Consults called:Nephrology    Status is: Inpatient    Dispo: The patient is from: Home.              Anticipated d/c is to: Home with home health services or SNF              Anticipated d/c date is: 02/26/2020              Patient currently not stable for discharge due to ongoing management of acute hypoxic respiratory failure.       Objective: Vitals:   02/24/20 1604 02/24/20 2339 02/25/20 0652 02/25/20 0823  BP: 138/67 (!) 144/71 131/72 94/65  Pulse: 76 91 67 99  Resp: 18 20 20 20   Temp: 98.8 F (37.1 C) 99.6 F (37.6 C) 98.4 F (36.9 C) 99.2 F (37.3 C)  TempSrc: Oral Oral Oral Oral  SpO2: 97% 91% 93% 93%  Weight:      Height:        Intake/Output Summary (Last 24 hours) at 02/25/2020 1520 Last data filed at 02/25/2020 1230 Gross per 24 hour  Intake 1078.09 ml  Output 4350 ml  Net -3271.91 ml   Filed Weights   02/20/20 1914 02/22/20 0730  Weight: (!) 145.2 kg (!) 145.2 kg    Exam:   General: 64 y.o. year-old female obese in no acute distress.  Alert and oriented x3.    Cardiovascular: Regular rate and rhythm no rubs or gallops.    Respiratory: Mild rales bases no wheezing noted.    Abdomen: Obese nontender normal  bowel sounds present.    Musculoskeletal: Trace lower extremity edema bilaterally.    Psychiatry: Mood is appropriate for condition and setting.   Data Reviewed: CBC: Recent Labs  Lab 02/20/20 2128 02/21/20 0949 02/22/20 0609 02/23/20 0728 02/25/20 0534  WBC 19.3* 20.8* 19.1* 15.5* 13.3*  NEUTROABS 16.3*  --   --   --  10.5*  HGB 11.0* 9.1* 10.3* 8.8* 9.8*  HCT 36.6 31.7* 33.3* 29.4* 32.3*  MCV 91.5 95.8 91.2 93.6 90.7  PLT 407* 397 395 367 623   Basic Metabolic Panel: Recent Labs  Lab 02/20/20 2128 02/21/20 0949 02/22/20 0609 02/23/20 0728 02/25/20 0534  NA 137  --  139 140 145  K 4.5  --  4.6 3.8 4.3  CL 95*  --  100 101 104  CO2 29  --  26 27 30   GLUCOSE 115*  --  159* 163* 104*  BUN 48*  --  61* 62* 38*  CREATININE 1.57* 2.33* 2.35* 1.83* 1.11*  CALCIUM 8.9  --  8.1* 7.9* 8.7*  MG  --   --   --   --  2.1  PHOS  --   --   --  4.2 3.0   GFR: Estimated Creatinine Clearance: 73.6 mL/min (A) (by C-G formula based on SCr of 1.11 mg/dL (H)). Liver Function Tests: Recent Labs  Lab 02/20/20 2128 02/23/20 0728 02/25/20 0534  AST 47* 53* 36  ALT 26 32 28  ALKPHOS 119 121 112  BILITOT 1.8* 0.7 0.8  PROT 7.8 6.8 6.6  ALBUMIN 2.6* 2.4* 2.5*   No results for input(s): LIPASE, AMYLASE in the last 168 hours. Recent Labs  Lab 02/21/20 1645  AMMONIA 32   Coagulation Profile: Recent Labs  Lab 02/22/20 0609  INR 1.2   Cardiac Enzymes: No results for input(s): CKTOTAL, CKMB, CKMBINDEX, TROPONINI in the last 168 hours. BNP (last 3 results) No results for input(s): PROBNP in the last 8760 hours. HbA1C: No results for input(s): HGBA1C in the last 72 hours. CBG: Recent Labs  Lab 02/24/20 1512 02/24/20 1800 02/24/20 2104 02/25/20 0817 02/25/20 1201  GLUCAP 92 119* 100* 105* 127*   Lipid Profile: No results for input(s): CHOL, HDL, LDLCALC, TRIG, CHOLHDL, LDLDIRECT in the last 72 hours. Thyroid Function Tests: Recent Labs    02/23/20 0728  TSH 0.762     Anemia Panel: No results for input(s): VITAMINB12, FOLATE, FERRITIN, TIBC, IRON, RETICCTPCT in the last 72 hours. Urine analysis:    Component Value Date/Time   COLORURINE YELLOW (A) 02/21/2020 0342   APPEARANCEUR HAZY (A) 02/21/2020 0342   APPEARANCEUR CLOUDY 01/15/2014 0209   LABSPEC 1.010 02/21/2020 0342   LABSPEC 1.014 01/15/2014 0209   PHURINE 6.0 02/21/2020 0342   GLUCOSEU NEGATIVE 02/21/2020 0342   GLUCOSEU NEGATIVE 01/15/2014 0209   HGBUR NEGATIVE 02/21/2020 0342   BILIRUBINUR NEGATIVE 02/21/2020 0342   BILIRUBINUR NEGATIVE 01/15/2014 0209   KETONESUR NEGATIVE 02/21/2020 0342   PROTEINUR NEGATIVE 02/21/2020 0342   NITRITE NEGATIVE 02/21/2020 0342   LEUKOCYTESUR NEGATIVE 02/21/2020 0342   LEUKOCYTESUR  2+ 01/15/2014 0209   Sepsis Labs: @LABRCNTIP (procalcitonin:4,lacticidven:4)  ) Recent Results (from the past 240 hour(s))  Blood culture (routine x 2)     Status: None (Preliminary result)   Collection Time: 02/21/20  2:30 AM   Specimen: BLOOD  Result Value Ref Range Status   Specimen Description BLOOD LEFT FA  Final   Special Requests   Final    BOTTLES DRAWN AEROBIC AND ANAEROBIC Blood Culture adequate volume   Culture   Final    NO GROWTH 4 DAYS Performed at [facility], [address] [place], [place] [address]    Report Status PENDING  Incomplete  Blood culture (routine x 2)     Status: None (Preliminary result)   Collection Time: 02/21/20  2:30 AM   Specimen: BLOOD  Result Value Ref Range Status   Specimen Description BLOOD RIGHT FA  Final   Special Requests   Final    BOTTLES DRAWN AEROBIC AND ANAEROBIC Blood Culture adequate volume   Culture   Final    NO GROWTH 4 DAYS Performed at [facility], [address], [place], [place] [address]    Report Status PENDING  Incomplete  SARS Coronavirus 2 by RT PCR (hospital order, performed in [place] hospital lab) Nasopharyngeal Nasopharyngeal Swab     Status: None   Collection Time:  02/21/20  2:30 AM   Specimen: Nasopharyngeal Swab  Result Value Ref Range Status   SARS Coronavirus 2 NEGATIVE NEGATIVE Final    Comment: (NOTE) SARS-CoV-2 target nucleic acids are NOT DETECTED.  The SARS-CoV-2 RNA is generally detectable in upper and lower respiratory specimens during the acute phase of infection. The lowest concentration of SARS-CoV-2 viral copies this assay can detect is 250 copies / mL. A negative result does not preclude SARS-CoV-2 infection and should not be used as the sole basis for treatment or other patient management decisions.  A negative result may occur with improper specimen collection / handling, submission of specimen other than nasopharyngeal swab, presence of viral mutation(s) within the areas targeted by this assay, and inadequate number of viral copies (<250 copies / mL). A negative result must be combined with clinical observations, patient history, and epidemiological information.  Fact Sheet for Patients:   StrictlyIdeas.no  Fact Sheet for Healthcare Providers: BankingDealers.co.za  This test is not yet approved or  cleared by the [place] FDA and has been authorized for detection and/or diagnosis of SARS-CoV-2 by FDA under an Emergency Use Authorization (EUA).  This EUA will remain in effect (meaning this test can be used) for the duration of the COVID-19 declaration under Section 564(b)(1) of the Act, 21 U.S.C. section 360bbb-3(b)(1), unless the authorization is terminated or revoked sooner.  Performed at [facility], [address] [place], [place] [address]   Culture, blood (single)     Status: None (Preliminary result)   Collection Time: 02/21/20  9:35 AM   Specimen: BLOOD  Result Value Ref Range Status   Specimen Description BLOOD BLOOD RIGHT HAND  Final   Special Requests   Final    BOTTLES DRAWN AEROBIC ONLY Blood Culture results may not be optimal due to an  inadequate volume of blood received in culture bottles   Culture   Final    NO GROWTH 4 DAYS Performed at [facility], [address] [place], [place] [address]    Report Status PENDING  Incomplete  MRSA PCR Screening     Status: None   Collection Time:  02/21/20  4:14 PM   Specimen: Nasal Mucosa; Nasopharyngeal  Result Value Ref Range Status   MRSA by PCR NEGATIVE NEGATIVE Final    Comment:        The GeneXpert MRSA Assay (FDA approved for NASAL specimens only), is one component of a comprehensive MRSA colonization surveillance program. It is not intended to diagnose MRSA infection nor to guide or monitor treatment for MRSA infections. Performed at [facility], [address], [place], [place] [address]       Studies: No results found.  Scheduled Meds:  atorvastatin  20 mg Oral Q2200   budesonide (PULMICORT) nebulizer solution  0.5 mg Nebulization BID   Chlorhexidine Gluconate Cloth  6 each Topical Daily   diclofenac Sodium  2 g Topical QID   enoxaparin (LOVENOX) injection  40 mg Subcutaneous BID   furosemide  20 mg Intravenous TID   gabapentin  100 mg Oral BID   insulin aspart  0-20 Units Subcutaneous TID WC   insulin aspart  0-5 Units Subcutaneous QHS   levothyroxine  250 mcg Oral q morning - 10a   lidocaine  1 patch Transdermal Daily   multivitamin with minerals  1 tablet Oral Daily   rOPINIRole  3 mg Oral TID    Continuous Infusions:  sodium chloride Stopped (02/22/20 0732)   cefTRIAXone (ROCEPHIN)  IV 2 g (02/25/20 0621)     LOS: 4 days     Kayleen Memos, MD [facility] Pager [phone]  If 7PM-7AM, please contact night-coverage [email or web address] Password [facility] 02/25/2020, 3:20 PM

## 2020-02-25 NOTE — Plan of Care (Signed)

## 2020-02-25 NOTE — Progress Notes (Signed)
Attending DO notified of pt's episode of AFIB, pt currently in NSR.

## 2020-02-25 NOTE — Progress Notes (Signed)
RN and tech gave pt a bedbath, CHG wipes, changed gown and linens. Pt sat up in high fowlers and legs elevated. Pt performed incentive spirometer and flutter valve. Pt denies pain and denies any further needs at this time. Call bell within reach and RN number on board.

## 2020-02-25 NOTE — Progress Notes (Signed)
Per telemetry pt had a 7 run of v tach. Attending DO notified

## 2020-02-26 LAB — GLUCOSE, CAPILLARY
Glucose-Capillary: 126 mg/dL — ABNORMAL HIGH (ref 70–99)
Glucose-Capillary: 123 mg/dL — ABNORMAL HIGH (ref 70–99)
Glucose-Capillary: 161 mg/dL — ABNORMAL HIGH (ref 70–99)
Glucose-Capillary: 101 mg/dL — ABNORMAL HIGH (ref 70–99)
Glucose-Capillary: 132 mg/dL — ABNORMAL HIGH (ref 70–99)

## 2020-02-26 LAB — CBC WITH DIFFERENTIAL/PLATELET
Abs Immature Granulocytes: 0.08 10*3/uL — ABNORMAL HIGH (ref 0.00–0.07)
Basophils Absolute: 0 10*3/uL (ref 0.0–0.1)
Basophils Relative: 0 %
Eosinophils Absolute: 0.2 10*3/uL (ref 0.0–0.5)
Eosinophils Relative: 2 %
HCT: 33.7 % — ABNORMAL LOW (ref 36.0–46.0)
Hemoglobin: 10.3 g/dL — ABNORMAL LOW (ref 12.0–15.0)
Immature Granulocytes: 1 %
Lymphocytes Relative: 9 %
Lymphs Abs: 1.2 10*3/uL (ref 0.7–4.0)
MCH: 27.7 pg (ref 26.0–34.0)
MCHC: 30.6 g/dL (ref 30.0–36.0)
MCV: 90.6 fL (ref 80.0–100.0)
Monocytes Absolute: 0.8 10*3/uL (ref 0.1–1.0)
Monocytes Relative: 6 %
Neutro Abs: 10.3 10*3/uL — ABNORMAL HIGH (ref 1.7–7.7)
Neutrophils Relative %: 82 %
Platelets: 401 10*3/uL — ABNORMAL HIGH (ref 150–400)
RBC: 3.72 MIL/uL — ABNORMAL LOW (ref 3.87–5.11)
RDW: 17.2 % — ABNORMAL HIGH (ref 11.5–15.5)
WBC: 12.5 10*3/uL — ABNORMAL HIGH (ref 4.0–10.5)
nRBC: 0 % (ref 0.0–0.2)

## 2020-02-26 LAB — CULTURE, BLOOD (SINGLE): Culture: NO GROWTH

## 2020-02-26 LAB — CULTURE, BLOOD (ROUTINE X 2)
Culture: NO GROWTH
Culture: NO GROWTH
Special Requests: ADEQUATE
Special Requests: ADEQUATE

## 2020-02-26 LAB — COMPREHENSIVE METABOLIC PANEL
ALT: 24 U/L (ref 0–44)
AST: 29 U/L (ref 15–41)
Albumin: 2.4 g/dL — ABNORMAL LOW (ref 3.5–5.0)
Alkaline Phosphatase: 115 U/L (ref 38–126)
Anion gap: 12 (ref 5–15)
BUN: 30 mg/dL — ABNORMAL HIGH (ref 8–23)
CO2: 35 mmol/L — ABNORMAL HIGH (ref 22–32)
Calcium: 8.9 mg/dL (ref 8.9–10.3)
Chloride: 96 mmol/L — ABNORMAL LOW (ref 98–111)
Creatinine, Ser: 1.11 mg/dL — ABNORMAL HIGH (ref 0.44–1.00)
GFR calc Af Amer: >60 mL/min (ref >60)
GFR calc non Af Amer: 52 mL/min — ABNORMAL LOW (ref >60)
Glucose, Bld: 111 mg/dL — ABNORMAL HIGH (ref 70–99)
Potassium: 3.5 mmol/L (ref 3.5–5.1)
Sodium: 143 mmol/L (ref 135–145)
Total Bilirubin: 1 mg/dL (ref 0.3–1.2)
Total Protein: 7.1 g/dL (ref 6.5–8.1)

## 2020-02-26 LAB — PROCALCITONIN: Procalcitonin: 0.45 ng/mL

## 2020-02-26 MED ORDER — FUROSEMIDE 10 MG/ML IJ SOLN: 20.0000 mg | Freq: Three times a day (TID) | INTRAMUSCULAR | Status: DC

## 2020-02-26 MED ORDER — FUROSEMIDE 10 MG/ML IJ SOLN
20.0000 mg | Freq: Three times a day (TID) | INTRAMUSCULAR | Status: AC
  Administered 2020-02-26 (×3): 20 mg via INTRAVENOUS
  Filled 2020-02-26 (×3): qty 4

## 2020-02-26 MED ORDER — POTASSIUM CHLORIDE CRYS ER 20 MEQ PO TBCR
40.0000 meq | EXTENDED_RELEASE_TABLET | Freq: Two times a day (BID) | ORAL | Status: DC
  Administered 2020-02-26 (×2): 40 meq via ORAL
  Filled 2020-02-26 (×2): qty 2

## 2020-02-26 NOTE — TOC Initial Note (Signed)
Transition of Care [facility]) - Initial/Assessment Note    Patient Details  Name: Madison Mcbride MRN: [id number] Date of Birth: [birth year]  Transition of Care [facility]) CM/SW Contact:    Shelbie Ammons, RN Phone Number: 02/26/2020, 3:25 PM  Clinical Narrative:  09:30: RNCM assessed patient at bedside to discuss bed offers, initially [facility] in [place] and [place]. Patient initially reporting she did not wish to travel out of the county if possible. RNCM reached out to both [facility] and [facility] and they were able to offer beds as well.   11:00: RNCM met at bedside again to update on bed offers, husband present with several questions. RNCM reached out to [place] with [facility] so that he may call and answer questions. After phone call patient and husband agreeable to accept bed at [facility]. RNCM reached out to [place] who will start the insurance authorization.            Expected Discharge Plan: Skilled Nursing Facility Barriers to Discharge: No Barriers Identified   Patient Goals and CMS Choice        Expected Discharge Plan and Services Expected Discharge Plan: [place]   Discharge Planning Services: CM Consult Post Acute Care Choice: [place] Living arrangements for the past 2 months: Single Family Home Expected Discharge Date: 02/26/20                                    Prior Living Arrangements/Services Living arrangements for the past 2 months: Single Family Home Lives with:: Spouse Patient language and need for interpreter reviewed:: Yes Do you feel safe going back to the place where you live?: Yes      Need for Family Participation in Patient Care: Yes (Comment) Care giver support system in place?: Yes (comment)   Criminal Activity/Legal Involvement Pertinent to Current Situation/Hospitalization: No - Comment as needed  Activities of Daily Living Home Assistive Devices/Equipment: Eyeglasses, [place] Rile (specify type) ADL  Screening (condition at time of admission) Patient's cognitive ability adequate to safely complete daily activities?: Yes Is the patient deaf or have difficulty hearing?: No Does the patient have difficulty seeing, even when wearing glasses/contacts?: No Does the patient have difficulty concentrating, remembering, or making decisions?: No Patient able to express need for assistance with ADLs?: No Does the patient have difficulty dressing or bathing?: Yes Independently performs ADLs?: No Communication: Independent Dressing (OT): Needs assistance Is this a change from baseline?: Change from baseline, expected to last >3 days Grooming: Needs assistance Is this a change from baseline?: Change from baseline, expected to last >3 days Feeding: Independent Bathing: Needs assistance Toileting: Dependent In/Out Bed: Needs assistance Is this a change from baseline?: Change from baseline, expected to last >3 days Walks in Home: Needs assistance Does the patient have difficulty walking or climbing stairs?: Yes Weakness of Legs: Both Weakness of Arms/Hands: None  Permission Sought/Granted                  Emotional Assessment Appearance:: Appears stated age Attitude/Demeanor/Rapport: Guarded Affect (typically observed): Appropriate Orientation: : Oriented to Self, Oriented to Place, Oriented to  Time, Oriented to Situation Alcohol / Substance Use: Not Applicable Psych Involvement: No (comment)  Admission diagnosis:  Cellulitis of left lower extremity [L03.116] Cellulitis of right lower extremity [L03.115] AKI (acute kidney injury) ([place]) [N17.9] Sepsis ([place]) [A41.9] Severe sepsis ([place]) [A41.9, R65.20] Diabetic polyneuropathy associated with type 2 diabetes mellitus (  [place]) [E11.42] Sepsis, due to unspecified organism, unspecified whether acute organ dysfunction present [facility]) [A41.9] Patient Active Problem List   Diagnosis Date Noted  . Type 2 diabetes mellitus without complication  ([place]) 09/40/7680  . Hypothyroidism 02/21/2020  . Morbid obesity with BMI of 50.0-59.9, adult ([place]) 02/21/2020  . Severe sepsis ([place]) 02/21/2020  . CAP (community acquired pneumonia) 02/21/2020  . Bilateral cellulitis of lower leg 02/21/2020  . Acute respiratory failure with hypoxia ([place]) 02/21/2020  . AKI (acute kidney injury) ([place] ) 02/21/2020  . Acute metabolic encephalopathy 88/05/314  . Sepsis ([place]) 02/21/2020   PCP:  Rusty Aus, MD Pharmacy:   [id number] [id number]: [phone]: [phone]    Social Determinants of Health (SDOH) Interventions    Readmission Risk Interventions No flowsheet data found.

## 2020-02-26 NOTE — Progress Notes (Signed)
PT Cancellation Note  Patient Details Name: Madison Mcbride MRN: [id number] DOB: 13-Mar-1956   Cancelled Treatment:    Reason Eval/Treat Not Completed: Patient declined to participate with PT services this date secondary to pain with movement, nursing notified.  Pt offered low intensity bed therex but declined that as well.  Offered to come back later this date to attempt PT if patient was feeling better with patient also requesting no further attempts at PT services this date.  Will attempt to see pt at a future date as medically appropriate.    Linus Salmons PT, DPT 02/26/20, 1:49 PM

## 2020-02-26 NOTE — Progress Notes (Signed)
Physical Therapy Treatment Patient Details Name: Madison Mcbride MRN: [id number] DOB: Oct 27, 1955 Today's Date: 02/26/2020    History of Present Illness Pt is a 64 y.o. female with medical history significant for morbid obesity, HTN, DM, hypothyroidism, diabetes, OSA, thyroid cancer, and CKD IIIb. Per MD impression, pt currently presents w/ severe sepsis, community acquired pneunomia, bilateral cellulitis of lower leg, acute respiratory failure w/ hypoxia, AKI, and acute metabolic encephalopathy.    PT Comments    Requested to room per RN for assistance with mobility during toileting needs; patient requesting attempt at transfer to [facility].  Patient able to complete sit/stand from elevated bed surface, mod/max assist +2; however, unable to maintain for adequate duration and unable to initiate any attempts at standing once upright due to pain and poor functional endurance.   Able to complete standing x3 with RW, but does require heavy assist for lift off from elevated bed height; assist to block knees, stabilize feet.  Very heavy WBing on RW (difficulty with L hand placement/grasp due to pain). Unable to achieve full postural extension (upright trunk), but did fully clear buttocks from bed surface.  Palpable crepitus noted L knee.  Dep assist for hygiene, peri-care after continent BM.    Follow Up Recommendations  SNF;Supervision/Assistance - 24 hour     Equipment Recommendations       Recommendations for Other Services       Precautions / Restrictions Precautions Precautions: Fall Restrictions Weight Bearing Restrictions: No    Mobility  Bed Mobility Overal bed mobility: Needs Assistance Bed Mobility: Sit to Supine       Sit to supine: Max assist;Total assist;+2 for physical assistance   General bed mobility comments: full assist for trunk control, LE elevation, scooting to head of bed and positioning fot comfort  Transfers Overall transfer level: Needs  assistance Equipment used: Rolling walker (2 wheeled) Transfers: Sit to/from Stand Sit to Stand: Max assist;Mod assist;+2 physical assistance         General transfer comment: heavy assist for lift off from elevated bed height; assist to block knees, stabilize feet.  very heavy WBing on RW (difficulty with L hand placement/grasp due to pain). Unable to achieve full postural extension (upright trunk), but did fully clear buttocks from bed surface.  Palpable crepitus noted L knee  Ambulation/Gait             General Gait Details: unsafe/unable   Stairs             Wheelchair Mobility    Modified Rankin (Stroke Patients Only)       Balance Overall balance assessment: Needs assistance Sitting-balance support: No upper extremity supported;Feet supported Sitting balance-Leahy Scale: Fair Sitting balance - Comments: poor righting reactions; very guarded, limited ability to move outside immediate BOS   Standing balance support: Bilateral upper extremity supported Standing balance-Leahy Scale: Zero Standing balance comment: max assist +2 for lift off and standing balance                            Cognition Arousal/Alertness: Awake/alert Behavior During Therapy: WFL for tasks assessed/performed Overall Cognitive Status: Within Functional Limits for tasks assessed                                        Exercises Other Exercises Other Exercises: Sit/stand from elevated bed surface x3 with RW,  mod/max assist +2 (see transfers for details).  Unsafe/unable to attempt transfer to [facility], requiring placement of bedpan under patient in sitting position for stool elimination.  Dep for peri-care, hygiene.  Max/total assist +2 for return to bed.    General Comments        Pertinent Vitals/Pain Pain Assessment: Faces Faces Pain Scale: Hurts even more Pain Location: back Pain Descriptors / Indicators: Guarding;Grimacing Pain Intervention(s): Limited  activity within patient's tolerance;Monitored during session;Repositioned    Home Living                      Prior Function            PT Goals (current goals can now be found in the care plan section) Acute Rehab PT Goals Patient Stated Goal: to get stronger and walk w/o walker PT Goal Formulation: With patient Time For Goal Achievement: 03/07/20 Potential to Achieve Goals: Fair Progress towards PT goals: Progressing toward goals    Frequency    Min 2X/week      PT Plan Current plan remains appropriate    Co-evaluation              AM-PAC PT "6 Clicks" Mobility   Outcome Measure  Help needed turning from your back to your side while in a flat bed without using bedrails?: Total Help needed moving from lying on your back to sitting on the side of a flat bed without using bedrails?: Total Help needed moving to and from a bed to a chair (including a wheelchair)?: Total Help needed standing up from a chair using your arms (e.g., wheelchair or bedside chair)?: Total Help needed to walk in hospital room?: Total Help needed climbing 3-5 steps with a railing? : Total 6 Click Score: 6    End of Session Equipment Utilized During Treatment: Gait belt Activity Tolerance: Patient limited by pain Patient left: in bed;with call bell/phone within reach;with nursing/sitter in room Nurse Communication: Mobility status PT Visit Diagnosis: Unsteadiness on feet (R26.81);Muscle weakness (generalized) (M62.81);Difficulty in walking, not elsewhere classified (R26.2);Pain     Time: 3832-9191 PT Time Calculation (min) (ACUTE ONLY): 17 min  Charges:  $Therapeutic Activity: 8-22 mins                     Charlotta Lapaglia H. Owens Shark, PT, DPT, NCS 02/26/20, 4:36 PM [phone]

## 2020-02-26 NOTE — Progress Notes (Signed)
PROGRESS NOTE  Madison Mcbride JJK:[id number] DOB: Dec 06, 1955 DOA: 02/20/2020 PCP: Rusty Aus, MD  HPI/Recap of past 79 hours: 64 year old female with history of HTN, hypothyroidism, diabetes, OSA who presents to the emergency room presenting initially with bilateral leg pain becoming unresponsive, hypotensive and hypoxic while in the ER Chest x-ray consistent with pneumonia, urinalysis unremarkable.  02/26/20: Seen and examined.  R lower back pain this AM.  Pain control in place.  TOC assisting with placement.    Assessment/Plan: Principal Problem:   Severe sepsis ([place]) Active Problems:   Type 2 diabetes mellitus without complication (HCC)   Hypothyroidism   Morbid obesity with BMI of 50.0-59.9, adult ([place])   CAP (community acquired pneumonia)   Bilateral cellulitis of lower leg   Acute respiratory failure with hypoxia (HCC)   AKI (acute kidney injury) ([place])   Acute metabolic encephalopathy   Sepsis ([place])   Resolved septic shock, secondary to CAP and B/L lower extremity cellulitis, POA  Now off Vasopressors Maintaining MAP greater than 65 Completed 5 days of azithromycin Completed ceftriaxone day #6/6 Procalcitonin down trending 0.4 from 0.55 from 1.4 from 2.2 Leukocytosis improving, afebrile  Resolved acute respiratory failure with hypoxia ([place]) suspected due to CAP, poa Currently O2 saturation >90% on room air Home O2 evaluation for DC planning Continue bronchodilators, incentive spirometer and flutter valve. Continue diuretics, net I&O -7.3 L  Resolved bilateral lower extremity cellulitis Clinically improving -Lower extremity Doppler negative for DVT Continue diuretics  Resolved AKI (acute kidney injury) ([place]) appears ATN with sepsis -baseline creat 1.1 She is back to her baseline creatinine 1.1 from 1.8 from 2.3 Continue to monitor urine output Continue to avoid nephrotoxins and hypotension  Resolved acute metabolic encephalopathy secondary to  sepsis -Related to above  Type 2 diabetes mellitus with hyperglycemia and peripheral neuropathy  -Sliding scale insulin coverage -A1c 7.4% On March 2021 Continue gabapentin, at lower doses.  Hypothyroidism TSH 0.7 -Continue home levothyroxine  Morbid obesity with BMI of 50.0-59.9, adult ([place]) - complicates overall prognosis and care  Generalized deconditioning. PT recommended SNF TOC consulted to assist with SNF placement. Continue fall precautions  Acute urinary retention Foley catheter removed on 02/24/2020 Good urine output.    DVT prophylaxis:Lovenox subcu daily Code Status:full code Family Communication:  Updated husband at her bedside.  Consults called:Nephrology    Status is: Inpatient    Dispo: The patient is from: Home.              Anticipated d/c is to: SNF              Anticipated d/c date is: 02/27/2020              Patient currently stable for discharge.  Awaiting SNF placement.      Objective: Vitals:   02/25/20 0823 02/25/20 1549 02/25/20 2331 02/26/20 0745  BP: 94/65 (!) 123/58 (!) 115/45 (!) 113/59  Pulse: 99 83 69 82  Resp: 20 16 20 16   Temp: 99.2 F (37.3 C) 99.2 F (37.3 C) 98.5 F (36.9 C) 98.7 F (37.1 C)  TempSrc: Oral  Oral Oral  SpO2: 93% 98% 100% 97%  Weight:      Height:        Intake/Output Summary (Last 24 hours) at 02/26/2020 1459 Last data filed at 02/26/2020 1259 Gross per 24 hour  Intake 577.22 ml  Output 3350 ml  Net -2772.78 ml   Filed Weights   02/20/20 1914 02/22/20 0730  Weight: (!) 145.2 kg [name])  145.2 kg    Exam: No significant changes from prior exam.  . General: 64 y.o. year-old female obese in no acute distress.  Alert and oriented x3.  .  Cardiovascular: Regular rate and rhythm no rubs or gallops.   [name] Respiratory: Clear to auscultation bilaterally no wheezes or rales. . Abdomen: Obese nontender normal bowel sounds present.   . Musculoskeletal: Trace lower extremity edema  bilaterally.   [name] Psychiatry: Mood is appropriate for condition and setting.   Data Reviewed: CBC: Recent Labs  Lab 02/20/20 2128 02/20/20 2128 02/21/20 0949 02/22/20 0609 02/23/20 0728 02/25/20 0534 02/26/20 0302  WBC 19.3*   < > 20.8* 19.1* 15.5* 13.3* 12.5*  NEUTROABS 16.3*  --   --   --   --  10.5* 10.3*  HGB 11.0*   < > 9.1* 10.3* 8.8* 9.8* 10.3*  HCT 36.6   < > 31.7* 33.3* 29.4* 32.3* 33.7*  MCV 91.5   < > 95.8 91.2 93.6 90.7 90.6  PLT 407*   < > 397 395 367 371 401*   < > = values in this interval not displayed.   Basic Metabolic Panel: Recent Labs  Lab 02/20/20 2128 02/20/20 2128 02/21/20 0949 02/22/20 0609 02/23/20 0728 02/25/20 0534 02/26/20 0302  NA 137  --   --  139 140 145 143  K 4.5  --   --  4.6 3.8 4.3 3.5  CL 95*  --   --  100 101 104 96*  CO2 29  --   --  26 27 30  35*  GLUCOSE 115*  --   --  159* 163* 104* 111*  BUN 48*  --   --  61* 62* 38* 30*  CREATININE 1.57*   < > 2.33* 2.35* 1.83* 1.11* 1.11*  CALCIUM 8.9  --   --  8.1* 7.9* 8.7* 8.9  MG  --   --   --   --   --  2.1  --   PHOS  --   --   --   --  4.2 3.0  --    < > = values in this interval not displayed.   GFR: Estimated Creatinine Clearance: 73.6 mL/min (A) (by C-G formula based on SCr of 1.11 mg/dL (H)). Liver Function Tests: Recent Labs  Lab 02/20/20 2128 02/23/20 0728 02/25/20 0534 02/26/20 0302  AST 47* 53* 36 29  ALT 26 32 28 24  ALKPHOS 119 121 112 115  BILITOT 1.8* 0.7 0.8 1.0  PROT 7.8 6.8 6.6 7.1  ALBUMIN 2.6* 2.4* 2.5* 2.4*   No results for input(s): LIPASE, AMYLASE in the last 168 hours. Recent Labs  Lab 02/21/20 1645  AMMONIA 32   Coagulation Profile: Recent Labs  Lab 02/22/20 0609  INR 1.2   Cardiac Enzymes: No results for input(s): CKTOTAL, CKMB, CKMBINDEX, TROPONINI in the last 168 hours. BNP (last 3 results) No results for input(s): PROBNP in the last 8760 hours. HbA1C: No results for input(s): HGBA1C in the last 72 hours. CBG: Recent Labs  Lab  02/25/20 1201 02/25/20 1654 02/25/20 2116 02/26/20 0747 02/26/20 1140  GLUCAP 127* 110* 126* 123* 161*   Lipid Profile: No results for input(s): CHOL, HDL, LDLCALC, TRIG, CHOLHDL, LDLDIRECT in the last 72 hours. Thyroid Function Tests: No results for input(s): TSH, T4TOTAL, FREET4, T3FREE, THYROIDAB in the last 72 hours. Anemia Panel: No results for input(s): VITAMINB12, FOLATE, FERRITIN, TIBC, IRON, RETICCTPCT in the last 72 hours. Urine analysis:    Component Value  Date/Time   COLORURINE YELLOW (A) 02/21/2020 0342   APPEARANCEUR HAZY (A) 02/21/2020 0342   APPEARANCEUR CLOUDY 01/15/2014 0209   LABSPEC 1.010 02/21/2020 0342   LABSPEC 1.014 01/15/2014 0209   PHURINE 6.0 02/21/2020 0342   GLUCOSEU NEGATIVE 02/21/2020 0342   GLUCOSEU NEGATIVE 01/15/2014 0209   HGBUR NEGATIVE 02/21/2020 0342   BILIRUBINUR NEGATIVE 02/21/2020 0342   BILIRUBINUR NEGATIVE 01/15/2014 0209   KETONESUR NEGATIVE 02/21/2020 0342   PROTEINUR NEGATIVE 02/21/2020 0342   NITRITE NEGATIVE 02/21/2020 0342   LEUKOCYTESUR NEGATIVE 02/21/2020 0342   LEUKOCYTESUR 2+ 01/15/2014 0209   Sepsis Labs: @LABRCNTIP (procalcitonin:4,lacticidven:4)  ) Recent Results (from the past 240 hour(s))  Blood culture (routine x 2)     Status: None   Collection Time: 02/21/20  2:30 AM   Specimen: BLOOD  Result Value Ref Range Status   Specimen Description BLOOD LEFT FA  Final   Special Requests   Final    BOTTLES DRAWN AEROBIC AND ANAEROBIC Blood Culture adequate volume   Culture   Final    NO GROWTH 5 DAYS Performed at [facility], [address] [place], [place] [address]    Report Status 02/26/2020 FINAL  Final  Blood culture (routine x 2)     Status: None   Collection Time: 02/21/20  2:30 AM   Specimen: BLOOD  Result Value Ref Range Status   Specimen Description BLOOD RIGHT FA  Final   Special Requests   Final    BOTTLES DRAWN AEROBIC AND ANAEROBIC Blood Culture adequate volume   Culture   Final    NO  GROWTH 5 DAYS Performed at [facility], [address] [place], [place] [address]    Report Status 02/26/2020 FINAL  Final  SARS Coronavirus 2 by RT PCR (hospital order, performed in [facility] hospital lab) Nasopharyngeal Nasopharyngeal Swab     Status: None   Collection Time: 02/21/20  2:30 AM   Specimen: Nasopharyngeal Swab  Result Value Ref Range Status   SARS Coronavirus 2 NEGATIVE NEGATIVE Final    Comment: (NOTE) SARS-CoV-2 target nucleic acids are NOT DETECTED.  The SARS-CoV-2 RNA is generally detectable in upper and lower respiratory specimens during the acute phase of infection. The lowest concentration of SARS-CoV-2 viral copies this assay can detect is 250 copies / mL. A negative result does not preclude SARS-CoV-2 infection and should not be used as the sole basis for treatment or other patient management decisions.  A negative result may occur with improper specimen collection / handling, submission of specimen other than nasopharyngeal swab, presence of viral mutation(s) within the areas targeted by this assay, and inadequate number of viral copies (<250 copies / mL). A negative result must be combined with clinical observations, patient history, and epidemiological information.  Fact Sheet for Patients:   StrictlyIdeas.no  Fact Sheet for Healthcare Providers: BankingDealers.co.za  This test is not yet approved or  cleared by the [place] FDA and has been authorized for detection and/or diagnosis of SARS-CoV-2 by FDA under an Emergency Use Authorization (EUA).  This EUA will remain in effect (meaning this test can be used) for the duration of the COVID-19 declaration under Section 564(b)(1) of the Act, 21 U.S.C. section 360bbb-3(b)(1), unless the authorization is terminated or revoked sooner.  Performed at [facility], [address] [place] , [place] [address]   Culture, blood (single)      Status: None   Collection Time: 02/21/20  9:35 AM   Specimen: BLOOD  Result Value  Ref Range Status   Specimen Description BLOOD BLOOD RIGHT HAND  Final   Special Requests   Final    BOTTLES DRAWN AEROBIC ONLY Blood Culture results may not be optimal due to an inadequate volume of blood received in culture bottles   Culture   Final    NO GROWTH 5 DAYS Performed at [facility], [address] [place], [place] [address]    Report Status 02/26/2020 FINAL  Final  MRSA PCR Screening     Status: None   Collection Time: 02/21/20  4:14 PM   Specimen: Nasal Mucosa; Nasopharyngeal  Result Value Ref Range Status   MRSA by PCR NEGATIVE NEGATIVE Final    Comment:        The GeneXpert MRSA Assay (FDA approved for NASAL specimens only), is one component of a comprehensive MRSA colonization surveillance program. It is not intended to diagnose MRSA infection nor to guide or monitor treatment for MRSA infections. Performed at [facility], [address], [place], [place] [address]       Studies: No results found.  Scheduled Meds: . atorvastatin  20 mg Oral Q2200  . budesonide (PULMICORT) nebulizer solution  0.5 mg Nebulization BID  . Chlorhexidine Gluconate Cloth  6 each Topical Daily  . diclofenac Sodium  2 g Topical QID  . enoxaparin (LOVENOX) injection  40 mg Subcutaneous BID  . furosemide  20 mg Intravenous TID  . gabapentin  200 mg Oral TID  . insulin aspart  0-20 Units Subcutaneous TID WC  . insulin aspart  0-5 Units Subcutaneous QHS  . levothyroxine  250 mcg Oral q morning - 10a  . lidocaine  1 patch Transdermal Daily  . multivitamin with minerals  1 tablet Oral Daily  . potassium chloride  40 mEq Oral BID  . rOPINIRole  3 mg Oral TID    Continuous Infusions: . sodium chloride Stopped (02/22/20 0732)     LOS: 5 days     Kayleen Memos, MD [facility] Pager [phone]  If 7PM-7AM, please contact  night-coverage [email or web address] Password [facility] 02/26/2020, 2:59 PM

## 2020-02-27 LAB — GLUCOSE, CAPILLARY
Glucose-Capillary: 95 mg/dL (ref 70–99)
Glucose-Capillary: 116 mg/dL — ABNORMAL HIGH (ref 70–99)
Glucose-Capillary: 106 mg/dL — ABNORMAL HIGH (ref 70–99)
Glucose-Capillary: 109 mg/dL — ABNORMAL HIGH (ref 70–99)

## 2020-02-27 LAB — BASIC METABOLIC PANEL
Anion gap: 12 (ref 5–15)
BUN: 22 mg/dL (ref 8–23)
CO2: 35 mmol/L — ABNORMAL HIGH (ref 22–32)
Calcium: 8.7 mg/dL — ABNORMAL LOW (ref 8.9–10.3)
Chloride: 93 mmol/L — ABNORMAL LOW (ref 98–111)
Creatinine, Ser: 0.97 mg/dL (ref 0.44–1.00)
GFR calc Af Amer: >60 mL/min (ref >60)
GFR calc non Af Amer: >60 mL/min (ref >60)
Glucose, Bld: 108 mg/dL — ABNORMAL HIGH (ref 70–99)
Potassium: 3.7 mmol/L (ref 3.5–5.1)
Sodium: 140 mmol/L (ref 135–145)

## 2020-02-27 LAB — PROCALCITONIN: Procalcitonin: 0.89 ng/mL

## 2020-02-27 MED ORDER — POTASSIUM CHLORIDE CRYS ER 20 MEQ PO TBCR: 20.0000 meq | EXTENDED_RELEASE_TABLET | Freq: Every day | ORAL | 0 refills | Status: DC

## 2020-02-27 MED ORDER — FUROSEMIDE 40 MG PO TABS
40.0000 mg | ORAL_TABLET | Freq: Every day | ORAL | Status: DC
  Administered 2020-02-27 – 2020-03-01 (×4): 40 mg via ORAL
  Filled 2020-02-27 (×4): qty 1

## 2020-02-27 MED ORDER — POTASSIUM CHLORIDE CRYS ER 20 MEQ PO TBCR
20.0000 meq | EXTENDED_RELEASE_TABLET | Freq: Every day | ORAL | Status: DC
  Administered 2020-02-27 – 2020-03-01 (×4): 20 meq via ORAL
  Filled 2020-02-27 (×4): qty 1

## 2020-02-27 MED ORDER — TRAMADOL HCL 50 MG PO TABS: 50.0000 mg | ORAL_TABLET | Freq: Two times a day (BID) | ORAL | 0 refills | Status: DC | PRN

## 2020-02-27 MED ORDER — GABAPENTIN 100 MG PO CAPS: 200.0000 mg | ORAL_CAPSULE | Freq: Three times a day (TID) | ORAL | 0 refills | Status: DC

## 2020-02-27 MED ORDER — BUPROPION HCL ER (XL) 150 MG PO TB24
150.0000 mg | ORAL_TABLET | Freq: Every day | ORAL | Status: DC
  Administered 2020-02-27 – 2020-03-01 (×4): 150 mg via ORAL
  Filled 2020-02-27 (×4): qty 1

## 2020-02-27 NOTE — Plan of Care (Signed)

## 2020-02-27 NOTE — Progress Notes (Signed)
PROGRESS NOTE  Cameron Katayama GGE:[id number] DOB: [birth year] DOA: 02/20/2020 PCP: Rusty Aus, MD  HPI/Recap of past 11 hours: 64 year old female with history of HTN, hypothyroidism, type II diabetes, severe morbid obesity, OSA who presents to [facility] ED initially with bilateral leg pain.  She became unresponsive, hypotensive and hypoxic while in the ER. Chest x-ray consistent with pneumonia.  Admitted for septic shock secondary to community-acquired pneumonia and bilateral lower extremity cellulitis, now off pressors and completed course of antibiotics.  Hospital course complicated by generalized weakness for which PT recommended SNF.  TOC assisting with placement.  Pending insurance authorization.  02/27/20: Seen and examined.  Left wrist edema noted.  She states she has carpal tunnel on her left wrist.  Spoke with her husband [name] via phone who will bring her wrist brace.  Advised to elevate her left upper extremity.   Assessment/Plan: Principal Problem:   Severe sepsis ([place]) Active Problems:   Type 2 diabetes mellitus without complication (HCC)   Hypothyroidism   Morbid obesity with BMI of 50.0-59.9, adult ([place])   CAP (community acquired pneumonia)   Bilateral cellulitis of lower leg   Acute respiratory failure with hypoxia (HCC)   AKI (acute kidney injury) ([place])   Acute metabolic encephalopathy   Sepsis ([place])   Resolved septic shock, secondary to CAP and B/L lower extremity cellulitis, POA  Now off Vasopressors Maintaining MAP greater than 65 Completed 5 days of azithromycin Completed 6 days of ceftriaxone  Resolved acute respiratory failure with hypoxia ([place]) suspected due to CAP, poa Currently O2 saturation >90% on room air Home O2 evaluation for DC planning Continue bronchodilators, incentive spirometer and flutter valve. Continue diuretics, net I&O -8.3 L  Resolved bilateral lower extremity cellulitis Clinically improving -Lower extremity Doppler negative  for DVT Continue home diuretics  Resolved AKI (acute kidney injury) ([place]) appears 2/2 to ATN with sepsis She is back to her baseline creatinine 0.9 with GFR greater than 60 from creatinine of 2.3. 2.7 L urine output recorded in the last 24 hours. Continue to avoid nephrotoxins and hypotension  Resolved acute metabolic encephalopathy secondary to sepsis Likely related to sepsis. Currently she is alert and oriented x4.  Back to her baseline mentation.  Type 2 diabetes mellitus with hyperglycemia and peripheral neuropathy  -Sliding scale insulin coverage -A1c 7.4% On March 2021 Continue gabapentin, at lower doses.  She became very sleepy on her home dose of gabapentin dose was reduced to 200 mg 3 times daily.  Hypothyroidism TSH 0.7 -Continue home levothyroxine  Morbid obesity with BMI of 50.0-59.9, adult ([place]) - complicates overall prognosis and care  Generalized deconditioning. PT recommended SNF TOC consulted to assist with SNF placement. Continue fall precautions Continue PT OT with assistance  Acute urinary retention Foley catheter removed on 02/24/2020 Good urine output.  Left wrist carpal tunnel Recommended to wear her brace at night Her husband will bring her wrist brace    DVT prophylaxis:Lovenox subcu daily Code Status:full code Family Communication:  Updated husband via phone on 02/27/20.  Consults called:Nephrology    Status is: Inpatient    Dispo: The patient is from: Home.              Anticipated d/c is to: SNF              Anticipated d/c date is: 02/28/2020              Patient currently stable for discharge.  Awaiting insurance authorization for SNF placement.  Objective: Vitals:   02/26/20 1628 02/26/20 2340 02/27/20 0728 02/27/20 0757  BP: (!) 116/48 (!) 106/57 (!) 115/56   Pulse: 94 70 90   Resp: 16 20 16    Temp: 99.1 F (37.3 C) 99.1 F (37.3 C) 98.4 F (36.9 C)   TempSrc: Oral Oral Oral   SpO2: 95% 98% 94%  94%  Weight:      Height:        Intake/Output Summary (Last 24 hours) at 02/27/2020 1509 Last data filed at 02/27/2020 1330 Gross per 24 hour  Intake 480 ml  Output 1550 ml  Net -1070 ml   Filed Weights   02/20/20 1914 02/22/20 0730  Weight: (!) 145.2 kg (!) 145.2 kg    Exam:   . General: 64 y.o. year-old female obese in no acute distress.  Alert oriented x3. .  Cardiovascular: Regular rate and rhythm no rubs or gallops.   [name] Respiratory: Clear to auscultation no wheezes or rales.   . Abdomen: Obese nontender normal bowel sounds present.   . Musculoskeletal: Trace lower extremity edema bilaterally.   [name] Psychiatry: Mood is appropriate for condition and setting.   Data Reviewed: CBC: Recent Labs  Lab 02/20/20 2128 02/20/20 2128 02/21/20 0949 02/22/20 0609 02/23/20 0728 02/25/20 0534 02/26/20 0302  WBC 19.3*   < > 20.8* 19.1* 15.5* 13.3* 12.5*  NEUTROABS 16.3*  --   --   --   --  10.5* 10.3*  HGB 11.0*   < > 9.1* 10.3* 8.8* 9.8* 10.3*  HCT 36.6   < > 31.7* 33.3* 29.4* 32.3* 33.7*  MCV 91.5   < > 95.8 91.2 93.6 90.7 90.6  PLT 407*   < > 397 395 367 371 401*   < > = values in this interval not displayed.   Basic Metabolic Panel: Recent Labs  Lab 02/22/20 0609 02/23/20 0728 02/25/20 0534 02/26/20 0302 02/27/20 0514  NA 139 140 145 143 140  K 4.6 3.8 4.3 3.5 3.7  CL 100 101 104 96* 93*  CO2 26 27 30  35* 35*  GLUCOSE 159* 163* 104* 111* 108*  BUN 61* 62* 38* 30* 22  CREATININE 2.35* 1.83* 1.11* 1.11* 0.97  CALCIUM 8.1* 7.9* 8.7* 8.9 8.7*  MG  --   --  2.1  --   --   PHOS  --  4.2 3.0  --   --    GFR: Estimated Creatinine Clearance: 84.3 mL/min (by C-G formula based on SCr of 0.97 mg/dL). Liver Function Tests: Recent Labs  Lab 02/20/20 2128 02/23/20 0728 02/25/20 0534 02/26/20 0302  AST 47* 53* 36 29  ALT 26 32 28 24  ALKPHOS 119 121 112 115  BILITOT 1.8* 0.7 0.8 1.0  PROT 7.8 6.8 6.6 7.1  ALBUMIN 2.6* 2.4* 2.5* 2.4*   No results for input(s):  LIPASE, AMYLASE in the last 168 hours. Recent Labs  Lab 02/21/20 1645  AMMONIA 32   Coagulation Profile: Recent Labs  Lab 02/22/20 0609  INR 1.2   Cardiac Enzymes: No results for input(s): CKTOTAL, CKMB, CKMBINDEX, TROPONINI in the last 168 hours. BNP (last 3 results) No results for input(s): PROBNP in the last 8760 hours. HbA1C: No results for input(s): HGBA1C in the last 72 hours. CBG: Recent Labs  Lab 02/26/20 1140 02/26/20 1632 02/26/20 2104 02/27/20 0729 02/27/20 1133  GLUCAP 161* 101* 132* 95 116*   Lipid Profile: No results for input(s): CHOL, HDL, LDLCALC, TRIG, CHOLHDL, LDLDIRECT in the last 72 hours. Thyroid Function  Tests: No results for input(s): TSH, T4TOTAL, FREET4, T3FREE, THYROIDAB in the last 72 hours. Anemia Panel: No results for input(s): VITAMINB12, FOLATE, FERRITIN, TIBC, IRON, RETICCTPCT in the last 72 hours. Urine analysis:    Component Value Date/Time   COLORURINE YELLOW (A) 02/21/2020 0342   APPEARANCEUR HAZY (A) 02/21/2020 0342   APPEARANCEUR CLOUDY 01/15/2014 0209   LABSPEC 1.010 02/21/2020 0342   LABSPEC 1.014 01/15/2014 0209   PHURINE 6.0 02/21/2020 0342   GLUCOSEU NEGATIVE 02/21/2020 0342   GLUCOSEU NEGATIVE 01/15/2014 0209   HGBUR NEGATIVE 02/21/2020 0342   BILIRUBINUR NEGATIVE 02/21/2020 0342   BILIRUBINUR NEGATIVE 01/15/2014 0209   KETONESUR NEGATIVE 02/21/2020 0342   PROTEINUR NEGATIVE 02/21/2020 0342   NITRITE NEGATIVE 02/21/2020 0342   LEUKOCYTESUR NEGATIVE 02/21/2020 0342   LEUKOCYTESUR 2+ 01/15/2014 0209   Sepsis Labs: @LABRCNTIP (procalcitonin:4,lacticidven:4)  ) Recent Results (from the past 240 hour(s))  Blood culture (routine x 2)     Status: None   Collection Time: 02/21/20  2:30 AM   Specimen: BLOOD  Result Value Ref Range Status   Specimen Description BLOOD LEFT FA  Final   Special Requests   Final    BOTTLES DRAWN AEROBIC AND ANAEROBIC Blood Culture adequate volume   Culture   Final    NO GROWTH 5  DAYS Performed at [facility], [address], [place], [place] [address]    Report Status 02/26/2020 FINAL  Final  Blood culture (routine x 2)     Status: None   Collection Time: 02/21/20  2:30 AM   Specimen: BLOOD  Result Value Ref Range Status   Specimen Description BLOOD RIGHT FA  Final   Special Requests   Final    BOTTLES DRAWN AEROBIC AND ANAEROBIC Blood Culture adequate volume   Culture   Final    NO GROWTH 5 DAYS Performed at [facility], [address], [place],  [address]    Report Status 02/26/2020 FINAL  Final  SARS Coronavirus 2 by RT PCR (hospital order, performed in [facility] hospital lab) Nasopharyngeal Nasopharyngeal Swab     Status: None   Collection Time: 02/21/20  2:30 AM   Specimen: Nasopharyngeal Swab  Result Value Ref Range Status   SARS Coronavirus 2 NEGATIVE NEGATIVE Final    Comment: (NOTE) SARS-CoV-2 target nucleic acids are NOT DETECTED.  The SARS-CoV-2 RNA is generally detectable in upper and lower respiratory specimens during the acute phase of infection. The lowest concentration of SARS-CoV-2 viral copies this assay can detect is 250 copies / mL. A negative result does not preclude SARS-CoV-2 infection and should not be used as the sole basis for treatment or other patient management decisions.  A negative result may occur with improper specimen collection / handling, submission of specimen other than nasopharyngeal swab, presence of viral mutation(s) within the areas targeted by this assay, and inadequate number of viral copies (<250 copies / mL). A negative result must be combined with clinical observations, patient history, and epidemiological information.  Fact Sheet for Patients:   StrictlyIdeas.no  Fact Sheet for Healthcare Providers: BankingDealers.co.za  This test is not yet approved or  cleared by the [place] FDA and has been authorized for detection  and/or diagnosis of SARS-CoV-2 by FDA under an Emergency Use Authorization (EUA).  This EUA will remain in effect (meaning this test can be used) for the duration of the COVID-19 declaration under Section 564(b)(1) of the Act, 21 U.S.C. section 360bbb-3(b)(1), unless the authorization is terminated or revoked sooner.  Performed at [facility], [address] [place], [place] [address]   Culture, blood (single)     Status: None   Collection Time: 02/21/20  9:35 AM   Specimen: BLOOD  Result Value Ref Range Status   Specimen Description BLOOD BLOOD RIGHT HAND  Final   Special Requests   Final    BOTTLES DRAWN AEROBIC ONLY Blood Culture results may not be optimal due to an inadequate volume of blood received in culture bottles   Culture   Final    NO GROWTH 5 DAYS Performed at [facility], [address] [place], [place] [address]    Report Status 02/26/2020 FINAL  Final  MRSA PCR Screening     Status: None   Collection Time: 02/21/20  4:14 PM   Specimen: Nasal Mucosa; Nasopharyngeal  Result Value Ref Range Status   MRSA by PCR NEGATIVE NEGATIVE Final    Comment:        The GeneXpert MRSA Assay (FDA approved for NASAL specimens only), is one component of a comprehensive MRSA colonization surveillance program. It is not intended to diagnose MRSA infection nor to guide or monitor treatment for MRSA infections. Performed at [facility], [address] [place],  [address]       Studies: No results found.  Scheduled Meds: . atorvastatin  20 mg Oral Q2200  . budesonide (PULMICORT) nebulizer solution  0.5 mg Nebulization BID  . buPROPion  150 mg Oral Daily  . Chlorhexidine Gluconate Cloth  6 each Topical Daily  . diclofenac Sodium  2 g Topical QID  . enoxaparin (LOVENOX) injection  40 mg Subcutaneous BID  . furosemide  40 mg Oral Daily  . gabapentin  200 mg Oral TID  . insulin aspart  0-20 Units Subcutaneous TID WC  . insulin  aspart  0-5 Units Subcutaneous QHS  . levothyroxine  250 mcg Oral q morning - 10a  . lidocaine  1 patch Transdermal Daily  . multivitamin with minerals  1 tablet Oral Daily  . potassium chloride  20 mEq Oral Daily  . rOPINIRole  3 mg Oral TID    Continuous Infusions: . sodium chloride Stopped (02/22/20 0732)     LOS: 6 days     Kayleen Memos, MD [facility] Pager [phone]  If 7PM-7AM, please contact night-coverage [email or web address] Password [facility] 02/27/2020, 3:09 PM

## 2020-02-27 NOTE — Progress Notes (Signed)
Patient's home CPAP examined and found to be in proper working condition. No lose or frayed wires, plugged into red outlet. Patient states she self places CPAP. Advised to call RT if further assistance is needed.

## 2020-02-27 NOTE — Progress Notes (Signed)
Order received from Dr. Nevada Crane to discontinue telemetry

## 2020-02-28 ENCOUNTER — Inpatient Hospital Stay: Payer: BC Managed Care – PPO

## 2020-02-28 DIAGNOSIS — R6521 Severe sepsis with septic shock: Secondary | ICD-10-CM

## 2020-02-28 DIAGNOSIS — M545 Low back pain, unspecified: Secondary | ICD-10-CM

## 2020-02-28 DIAGNOSIS — E1140 Type 2 diabetes mellitus with diabetic neuropathy, unspecified: Secondary | ICD-10-CM

## 2020-02-28 DIAGNOSIS — Z6841 Body Mass Index (BMI) 40.0 and over, adult: Secondary | ICD-10-CM

## 2020-02-28 DIAGNOSIS — E039 Hypothyroidism, unspecified: Secondary | ICD-10-CM

## 2020-02-28 LAB — GLUCOSE, CAPILLARY
Glucose-Capillary: 74 mg/dL (ref 70–99)
Glucose-Capillary: 99 mg/dL (ref 70–99)
Glucose-Capillary: 105 mg/dL — ABNORMAL HIGH (ref 70–99)
Glucose-Capillary: 198 mg/dL — ABNORMAL HIGH (ref 70–99)

## 2020-02-28 LAB — CREATININE, SERUM
Creatinine, Ser: 0.85 mg/dL (ref 0.44–1.00)
GFR calc Af Amer: >60 mL/min (ref >60)
GFR calc non Af Amer: >60 mL/min (ref >60)

## 2020-02-28 MED ORDER — METHYLPREDNISOLONE SODIUM SUCC 40 MG IJ SOLR
40.0000 mg | Freq: Every day | INTRAMUSCULAR | Status: DC
  Administered 2020-02-28 – 2020-02-29 (×2): 40 mg via INTRAVENOUS
  Filled 2020-02-28 (×2): qty 1

## 2020-02-28 NOTE — Progress Notes (Signed)
Physical Therapy Treatment Patient Details Name: Madison Mcbride MRN: [id number] DOB: 02/25/1956 Today's Date: 02/28/2020    History of Present Illness Pt is a 64 y.o. female with medical history significant for morbid obesity, HTN, DM, hypothyroidism, diabetes, OSA, thyroid cancer, and CKD IIIb. Per MD impression, pt currently presents w/ severe sepsis, community acquired pneunomia, bilateral cellulitis of lower leg, acute respiratory failure w/ hypoxia, AKI, and acute metabolic encephalopathy.    PT Comments    Pt was pleasant and motivated to participate during the session. Pt reported 7/10 low back pain this session; fairly limited throughout session likely 2/2 pain. Pt found in supine on 3L O2 via [place]; SpO2 measured as 97%. Pt able to perform siting/supine exercises w/ min instructional cueing and no physical assist. Pt able to perform multiple L/R rolls throughout session w/ mod-A and cueing for positioning/sequencing. Pt +2 mod-A for supine-sit and +2 max/total-A for sit-supine w/ assist needed for trunk control, BLE management, and scooting to [facility] / repositioning. Pt attempted sit-to-stand this session; w/ +2 max-A pt was able to unweight from the bed multiple times, but unable to clear surface. Pt was unsafe/unable to perform further mobility. Pt will benefit from PT services in a SNF setting upon discharge to safely address deficits listed in patient problem list for decreased caregiver assistance and eventual return to PLOF.   Follow Up Recommendations  SNF;Supervision/Assistance - 24 hour     Equipment Recommendations  Other (comment) (tbd next venue of care)    Recommendations for Other Services       Precautions / Restrictions Precautions Precautions: Fall Restrictions Weight Bearing Restrictions: No    Mobility  Bed Mobility Overal bed mobility: Needs Assistance Bed Mobility: Sit to Supine;Supine to Sit;Rolling Rolling: Mod assist   Supine to sit: Mod assist;+2  for physical assistance;HOB elevated Sit to supine: Max assist;Total assist;+2 for physical assistance   General bed mobility comments: assist for trunk control, BLE management, and scooting to head of bed and positioning  Transfers Overall transfer level: Needs assistance Equipment used: Rolling walker (2 wheeled) Transfers: Sit to/from Stand Sit to Stand: Max assist;+2 physical assistance         General transfer comment: +2 max-A; pt able to unweight from the bed but unable to completely clear bed to come to standing  Ambulation/Gait             General Gait Details: unsafe/unable   Stairs             Wheelchair Mobility    Modified Rankin (Stroke Patients Only)       Balance Overall balance assessment: Needs assistance Sitting-balance support: No upper extremity supported;Feet supported Sitting balance-Leahy Scale: Fair Sitting balance - Comments: poor righting reactions; very guarded, limited ability to move outside immediate BOS                                    Cognition Arousal/Alertness: Awake/alert Behavior During Therapy: WFL for tasks assessed/performed Overall Cognitive Status: Within Functional Limits for tasks assessed                                        Exercises Total Joint Exercises Ankle Circles/Pumps: AROM;Strengthening;Both;10 reps Hip ABduction/ADduction: AROM;Strengthening;Both;10 reps Straight Leg Raises: AROM;Strengthening;Both;10 reps Long Arc Quad: AROM;Strengthening;Both;10 reps Knee Flexion: AROM;Strengthening;Both;10 reps Other Exercises  Other Exercises: Multiple L/R rolls in bed w/ cueing for hand positioning, sequencing    General Comments        Pertinent Vitals/Pain Pain Assessment: 0-10 Pain Score: 7  Pain Location: back Pain Descriptors / Indicators: Guarding;Grimacing Pain Intervention(s): Limited activity within patient's tolerance;Monitored during session;Repositioned     Home Living                      Prior Function            PT Goals (current goals can now be found in the care plan section) Progress towards PT goals: Progressing toward goals    Frequency    Min 2X/week      PT Plan Current plan remains appropriate    Co-evaluation              AM-PAC PT "6 Clicks" Mobility   Outcome Measure  Help needed turning from your back to your side while in a flat bed without using bedrails?: Total Help needed moving from lying on your back to sitting on the side of a flat bed without using bedrails?: Total Help needed moving to and from a bed to a chair (including a wheelchair)?: Total Help needed standing up from a chair using your arms (e.g., wheelchair or bedside chair)?: Total Help needed to walk in hospital room?: Total Help needed climbing 3-5 steps with a railing? : Total 6 Click Score: 6    End of Session Equipment Utilized During Treatment: Gait belt;Oxygen Activity Tolerance: Patient limited by pain;Patient limited by fatigue Patient left: in bed;with call bell/phone within reach;with nursing/sitter in room Nurse Communication: Mobility status PT Visit Diagnosis: Unsteadiness on feet (R26.81);Muscle weakness (generalized) (M62.81);Difficulty in walking, not elsewhere classified (R26.2);Pain Pain - Right/Left:  (back)     Time: 2248-2500 PT Time Calculation (min) (ACUTE ONLY): 47 min  Charges:                        Davione Lenker SPT 02/28/20, 4:01 PM

## 2020-02-28 NOTE — TOC Progression Note (Signed)
Transition of Care [facility]) - Progression Note    Patient Details  Name: Madison Mcbride MRN: [id number] Date of Birth: [birth year]  [facility] of [facility]) CM/SW Contact  Shelbie Ammons, RN Phone Number: 02/28/2020, 9:35 AM  Clinical Narrative:   RNCM reached out to [facility], at this time insurance authorization is still pending.     Expected Discharge Plan: [place] Barriers to Discharge: No Barriers Identified  Expected Discharge Plan and Services Expected Discharge Plan: [place]   Discharge Planning Services: CM Consult Post Acute Care Choice: [place] Living arrangements for the past 2 months: Single Family Home Expected Discharge Date: 02/27/20                                     Social Determinants of Health (SDOH) Interventions    Readmission Risk Interventions No flowsheet data found.

## 2020-02-28 NOTE — Progress Notes (Signed)
Patient ID: Madison Mcbride, female   DOB: 1955-12-21, 64 y.o.   MRN: [id number] [facility] PROGRESS NOTE  Britiany Silbernagel EUM:[id number] DOB: [birth year] DOA: 02/20/2020 PCP: Rusty Aus, MD  HPI/Subjective: Patient's main complaint when she came in was a low back pain and can hardly walk or pull herself up.  She is still having this pain in her back.  This morning we are waiting for insurance authorization to go to rehab.  Objective: Vitals:   02/28/20 0746 02/28/20 1503  BP: (!) 114/57 (!) 112/55  Pulse: 72 69  Resp: 17 17  Temp: 98.3 F (36.8 C) 97.9 F (36.6 C)  SpO2: 93% 97%    Intake/Output Summary (Last 24 hours) at 02/28/2020 1623 Last data filed at 02/28/2020 1600 Gross per 24 hour  Intake 720 ml  Output 2400 ml  Net -1680 ml   Filed Weights   02/20/20 1914 02/22/20 0730  Weight: (!) 145.2 kg (!) 145.2 kg    ROS: Review of Systems  Respiratory: Negative for shortness of breath.   Cardiovascular: Negative for chest pain.  Gastrointestinal: Negative for abdominal pain.  Musculoskeletal: Positive for back pain.   Exam: Physical Exam HENT:     Nose: No mucosal edema.     Mouth/Throat:     Pharynx: No oropharyngeal exudate.  Eyes:     General: Lids are normal.     Conjunctiva/sclera: Conjunctivae normal.     Pupils: Pupils are equal, round, and reactive to light.  Cardiovascular:     Rate and Rhythm: Normal rate and regular rhythm.     Heart sounds: Normal heart sounds, S1 normal and S2 normal.  Pulmonary:     Breath sounds: Examination of the right-lower field reveals decreased breath sounds. Examination of the left-lower field reveals decreased breath sounds. Decreased breath sounds present. No wheezing, rhonchi or rales.  Abdominal:     Palpations: Abdomen is soft.     Tenderness: There is no abdominal tenderness.  Musculoskeletal:     Right ankle: Swelling present.     Left ankle: Swelling present.  Skin:    General: Skin is warm.      Findings: No rash.  Neurological:     Mental Status: She is alert and oriented to person, place, and time.     Comments: Barely able to straight leg raise bilateral lower extremities.       Data Reviewed: Basic Metabolic Panel: Recent Labs  Lab 02/22/20 0609 02/22/20 0609 02/23/20 0728 02/25/20 0534 02/26/20 0302 02/27/20 0514 02/28/20 0516  NA 139  --  140 145 143 140  --   K 4.6  --  3.8 4.3 3.5 3.7  --   CL 100  --  101 104 96* 93*  --   CO2 26  --  27 30 35* 35*  --   GLUCOSE 159*  --  163* 104* 111* 108*  --   BUN 61*  --  62* 38* 30* 22  --   CREATININE 2.35*   < > 1.83* 1.11* 1.11* 0.97 0.85  CALCIUM 8.1*  --  7.9* 8.7* 8.9 8.7*  --   MG  --   --   --  2.1  --   --   --   PHOS  --   --  4.2 3.0  --   --   --    < > = values in this interval not displayed.   Liver Function Tests: Recent Labs  Lab 02/23/20  8032 02/25/20 0534 02/26/20 0302  AST 53* 36 29  ALT 32 28 24  ALKPHOS 121 112 115  BILITOT 0.7 0.8 1.0  PROT 6.8 6.6 7.1  ALBUMIN 2.4* 2.5* 2.4*    Recent Labs  Lab 02/21/20 1645  AMMONIA 32   CBC: Recent Labs  Lab 02/22/20 0609 02/23/20 0728 02/25/20 0534 02/26/20 0302  WBC 19.1* 15.5* 13.3* 12.5*  NEUTROABS  --   --  10.5* 10.3*  HGB 10.3* 8.8* 9.8* 10.3*  HCT 33.3* 29.4* 32.3* 33.7*  MCV 91.2 93.6 90.7 90.6  PLT 395 367 371 401*   BNP (last 3 results) Recent Labs    02/20/20 2128  BNP 180.5*    CBG: Recent Labs  Lab 02/27/20 1133 02/27/20 1625 02/27/20 2054 02/28/20 0744 02/28/20 1133  GLUCAP 116* 106* 109* 74 99    Recent Results (from the past 240 hour(s))  Blood culture (routine x 2)     Status: None   Collection Time: 02/21/20  2:30 AM   Specimen: BLOOD  Result Value Ref Range Status   Specimen Description BLOOD LEFT FA  Final   Special Requests   Final    BOTTLES DRAWN AEROBIC AND ANAEROBIC Blood Culture adequate volume   Culture   Final    NO GROWTH 5 DAYS Performed at [facility], [address] [place], [place] [address]    Report Status 02/26/2020 FINAL  Final  Blood culture (routine x 2)     Status: None   Collection Time: 02/21/20  2:30 AM   Specimen: BLOOD  Result Value Ref Range Status   Specimen Description BLOOD RIGHT FA  Final   Special Requests   Final    BOTTLES DRAWN AEROBIC AND ANAEROBIC Blood Culture adequate volume   Culture   Final    NO GROWTH 5 DAYS Performed at [facility], [address], [place], [place] [address]    Report Status 02/26/2020 FINAL  Final  SARS Coronavirus 2 by RT PCR (hospital order, performed in [facility] hospital lab) Nasopharyngeal Nasopharyngeal Swab     Status: None   Collection Time: 02/21/20  2:30 AM   Specimen: Nasopharyngeal Swab  Result Value Ref Range Status   SARS Coronavirus 2 NEGATIVE NEGATIVE Final    Comment: (NOTE) SARS-CoV-2 target nucleic acids are NOT DETECTED.  The SARS-CoV-2 RNA is generally detectable in upper and lower respiratory specimens during the acute phase of infection. The lowest concentration of SARS-CoV-2 viral copies this assay can detect is 250 copies / mL. A negative result does not preclude SARS-CoV-2 infection and should not be used as the sole basis for treatment or other patient management decisions.  A negative result may occur with improper specimen collection / handling, submission of specimen other than nasopharyngeal swab, presence of viral mutation(s) within the areas targeted by this assay, and inadequate number of viral copies (<250 copies / mL). A negative result must be combined with clinical observations, patient history, and epidemiological information.  Fact Sheet for Patients:   StrictlyIdeas.no  Fact Sheet for Healthcare Providers: BankingDealers.co.za  This test is not yet approved or  cleared by the [place] FDA and has been authorized for detection and/or diagnosis of SARS-CoV-2 by FDA under an  Emergency Use Authorization (EUA).  This EUA will remain in effect (meaning this test can be used) for the duration of the COVID-19 declaration under Section 564(b)(1) of the Act, 21 U.S.C. section 360bbb-3(b)(1), unless the authorization is terminated or revoked  sooner.  Performed at [facility], [address] [place], [place] [address]   Culture, blood (single)     Status: None   Collection Time: 02/21/20  9:35 AM   Specimen: BLOOD  Result Value Ref Range Status   Specimen Description BLOOD BLOOD RIGHT HAND  Final   Special Requests   Final    BOTTLES DRAWN AEROBIC ONLY Blood Culture results may not be optimal due to an inadequate volume of blood received in culture bottles   Culture   Final    NO GROWTH 5 DAYS Performed at [facility], [address] [place], [place] [address]    Report Status 02/26/2020 FINAL  Final  MRSA PCR Screening     Status: None   Collection Time: 02/21/20  4:14 PM   Specimen: Nasal Mucosa; Nasopharyngeal  Result Value Ref Range Status   MRSA by PCR NEGATIVE NEGATIVE Final    Comment:        The GeneXpert MRSA Assay (FDA approved for NASAL specimens only), is one component of a comprehensive MRSA colonization surveillance program. It is not intended to diagnose MRSA infection nor to guide or monitor treatment for MRSA infections. Performed at [facility], [address] [place], [place] [address]      Scheduled Meds: . atorvastatin  20 mg Oral Q2200  . budesonide (PULMICORT) nebulizer solution  0.5 mg Nebulization BID  . buPROPion  150 mg Oral Daily  . Chlorhexidine Gluconate Cloth  6 each Topical Daily  . diclofenac Sodium  2 g Topical QID  . enoxaparin (LOVENOX) injection  40 mg Subcutaneous BID  . furosemide  40 mg Oral Daily  . gabapentin  200 mg Oral TID  . insulin aspart  0-20 Units Subcutaneous TID WC  . insulin aspart  0-5 Units Subcutaneous QHS  . levothyroxine  250 mcg Oral q morning - 10a   . lidocaine  1 patch Transdermal Daily  . methylPREDNISolone (SOLU-MEDROL) injection  40 mg Intravenous Daily  . multivitamin with minerals  1 tablet Oral Daily  . potassium chloride  20 mEq Oral Daily  . rOPINIRole  3 mg Oral TID   Continuous Infusions: . sodium chloride Stopped (02/22/20 0732)    Assessment/Plan:  1. Severe low back pain.  Patient has pain to palpation over the lumbar spine.  Patient can hardly straight leg raise.  We will get an MRI of the lumbar spine for further evaluation.  We will give a dose of Solu-Medrol see if that improves things. 2. Septic shock, lower extremity cellulitis and community-acquired pneumonia.  Finished antibiotics. 3. Acute hypoxic respiratory failure.  Patient was on oxygen this morning.  Get pulse ox on room air. 4. Acute metabolic encephalopathy this has resolved. 5. Type 2 diabetes mellitus with peripheral neuropathy.  Hemoglobin A1c 7.4 back in March.  On gabapentin. 6. Hypothyroidism unspecified on levothyroxine 7. Morbid obesity with a BMI of 53.25.  Weight loss needed 8. 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    Code Status:     Code Status Orders  (From admission, onward)         Start     Ordered   02/21/20 0559  Full code  Continuous        02/21/20 0600        Code Status History    This patient has a current code status but no historical code status.   Advance Care Planning Activity     Family Communication:  Husband at the bedside Disposition Plan: Status is: Inpatient  Dispo:  Patient From: Home  Planned Disposition: [place]  Expected discharge date: Awaiting insurance authorization for rehab.  Once obtained can go out to rehab but getting mri first  Medically stable for discharge: Since patient had severe back pain has been having it the entire time here, I will get an MRI of the lumbar spine.  Time spent: 28 minutes  [place]

## 2020-02-28 NOTE — Progress Notes (Signed)
Patient o2 checked on room air she is at 95% and her pulse is 68bpm.

## 2020-02-29 DIAGNOSIS — M5416 Radiculopathy, lumbar region: Secondary | ICD-10-CM

## 2020-02-29 DIAGNOSIS — D890 Polyclonal hypergammaglobulinemia: Secondary | ICD-10-CM | POA: Insufficient documentation

## 2020-02-29 DIAGNOSIS — G629 Polyneuropathy, unspecified: Secondary | ICD-10-CM | POA: Insufficient documentation

## 2020-02-29 LAB — GLUCOSE, CAPILLARY
Glucose-Capillary: 159 mg/dL — ABNORMAL HIGH (ref 70–99)
Glucose-Capillary: 151 mg/dL — ABNORMAL HIGH (ref 70–99)
Glucose-Capillary: 110 mg/dL — ABNORMAL HIGH (ref 70–99)
Glucose-Capillary: 199 mg/dL — ABNORMAL HIGH (ref 70–99)

## 2020-02-29 MED ORDER — METHOCARBAMOL 500 MG PO TABS
500.0000 mg | ORAL_TABLET | Freq: Three times a day (TID) | ORAL | Status: DC | PRN
  Administered 2020-02-29: 500 mg via ORAL
  Filled 2020-02-29: qty 1

## 2020-02-29 NOTE — Plan of Care (Signed)

## 2020-02-29 NOTE — Progress Notes (Signed)
Patient ID: Madison Mcbride, female   DOB: January 14, 1956, 64 y.o.   MRN: [id number] [facility] PROGRESS NOTE  Madison Mcbride POE:[id number] DOB: 05-19-1956 DOA: 02/20/2020 PCP: Rusty Aus, MD  HPI/Subjective: Patient's back is a little bit better today this morning did have some spasms.  Able to move her legs around a little bit better today from straight leg raising.  Still waiting for insurance authorization.  Today complains of numbness in both feet.  Objective: Vitals:   02/29/20 0833 02/29/20 1533  BP:  (!) 102/53  Pulse: 82 74  Resp: 16 17  Temp:  97.6 F (36.4 C)  SpO2: 93% 98%    Intake/Output Summary (Last 24 hours) at 02/29/2020 1632 Last data filed at 02/29/2020 1500 Gross per 24 hour  Intake 1440 ml  Output --  Net 1440 ml   Filed Weights   02/20/20 1914 02/22/20 0730  Weight: (!) 145.2 kg (!) 145.2 kg    ROS: Review of Systems  Respiratory: Negative for shortness of breath.   Cardiovascular: Negative for chest pain.  Gastrointestinal: Negative for abdominal pain.  Musculoskeletal: Positive for back pain.  Neurological: Positive for tingling and sensory change.   Exam: Physical Exam HENT:     Nose: No mucosal edema.     Mouth/Throat:     Pharynx: No oropharyngeal exudate.  Eyes:     General: Lids are normal.     Conjunctiva/sclera: Conjunctivae normal.     Pupils: Pupils are equal, round, and reactive to light.  Cardiovascular:     Rate and Rhythm: Normal rate and regular rhythm.     Heart sounds: Normal heart sounds, S1 normal and S2 normal.  Pulmonary:     Breath sounds: No decreased breath sounds, wheezing, rhonchi or rales.  Abdominal:     Palpations: Abdomen is soft.     Tenderness: There is no abdominal tenderness.  Musculoskeletal:     Right ankle: No swelling.     Left ankle: No swelling.  Skin:    General: Skin is warm.     Findings: No rash.     Comments: Chronic lower extremity skin discoloration bilateral lower  extremity  Neurological:     Mental Status: She is alert and oriented to person, place, and time.     Comments: Able to straight leg raise a little bit better today bilaterally.       Data Reviewed: Basic Metabolic Panel: Recent Labs  Lab 02/23/20 0728 02/25/20 0534 02/26/20 0302 02/27/20 0514 02/28/20 0516  NA 140 145 143 140  --   K 3.8 4.3 3.5 3.7  --   CL 101 104 96* 93*  --   CO2 27 30 35* 35*  --   GLUCOSE 163* 104* 111* 108*  --   BUN 62* 38* 30* 22  --   CREATININE 1.83* 1.11* 1.11* 0.97 0.85  CALCIUM 7.9* 8.7* 8.9 8.7*  --   MG  --  2.1  --   --   --   PHOS 4.2 3.0  --   --   --    Liver Function Tests: Recent Labs  Lab 02/23/20 0728 02/25/20 0534 02/26/20 0302  AST 53* 36 29  ALT 32 28 24  ALKPHOS 121 112 115  BILITOT 0.7 0.8 1.0  PROT 6.8 6.6 7.1  ALBUMIN 2.4* 2.5* 2.4*   CBC: Recent Labs  Lab 02/23/20 0728 02/25/20 0534 02/26/20 0302  WBC 15.5* 13.3* 12.5*  NEUTROABS  --  10.5* 10.3*  HGB 8.8* 9.8* 10.3*  HCT 29.4* 32.3* 33.7*  MCV 93.6 90.7 90.6  PLT 367 371 401*   BNP (last 3 results) Recent Labs    02/20/20 2128  BNP 180.5*     CBG: Recent Labs  Lab 02/28/20 1133 02/28/20 1628 02/28/20 2114 02/29/20 0733 02/29/20 1106  GLUCAP 99 105* 198* 159* 151*    Recent Results (from the past 240 hour(s))  Blood culture (routine x 2)     Status: None   Collection Time: 02/21/20  2:30 AM   Specimen: BLOOD  Result Value Ref Range Status   Specimen Description BLOOD LEFT FA  Final   Special Requests   Final    BOTTLES DRAWN AEROBIC AND ANAEROBIC Blood Culture adequate volume   Culture   Final    NO GROWTH 5 DAYS Performed at [facility], [address] [place], [place] [address]    Report Status 02/26/2020 FINAL  Final  Blood culture (routine x 2)     Status: None   Collection Time: 02/21/20  2:30 AM   Specimen: BLOOD  Result Value Ref Range Status   Specimen Description BLOOD RIGHT FA  Final   Special Requests    Final    BOTTLES DRAWN AEROBIC AND ANAEROBIC Blood Culture adequate volume   Culture   Final    NO GROWTH 5 DAYS Performed at [facility], [address], [place], [place] [address]    Report Status 02/26/2020 FINAL  Final  SARS Coronavirus 2 by RT PCR (hospital order, performed in [facility] hospital lab) Nasopharyngeal Nasopharyngeal Swab     Status: None   Collection Time: 02/21/20  2:30 AM   Specimen: Nasopharyngeal Swab  Result Value Ref Range Status   SARS Coronavirus 2 NEGATIVE NEGATIVE Final    Comment: (NOTE) SARS-CoV-2 target nucleic acids are NOT DETECTED.  The SARS-CoV-2 RNA is generally detectable in upper and lower respiratory specimens during the acute phase of infection. The lowest concentration of SARS-CoV-2 viral copies this assay can detect is 250 copies / mL. A negative result does not preclude SARS-CoV-2 infection and should not be used as the sole basis for treatment or other patient management decisions.  A negative result may occur with improper specimen collection / handling, submission of specimen other than nasopharyngeal swab, presence of viral mutation(s) within the areas targeted by this assay, and inadequate number of viral copies (<250 copies / mL). A negative result must be combined with clinical observations, patient history, and epidemiological information.  Fact Sheet for Patients:   StrictlyIdeas.no  Fact Sheet for Healthcare Providers: BankingDealers.co.za  This test is not yet approved or  cleared by the [place] FDA and has been authorized for detection and/or diagnosis of SARS-CoV-2 by FDA under an Emergency Use Authorization (EUA).  This EUA will remain in effect (meaning this test can be used) for the duration of the COVID-19 declaration under Section 564(b)(1) of the Act, 21 U.S.C. section 360bbb-3(b)(1), unless the authorization is terminated or revoked  sooner.  Performed at [facility], [address] [place], [place] [address]   Culture, blood (single)     Status: None   Collection Time: 02/21/20  9:35 AM   Specimen: BLOOD  Result Value Ref Range Status   Specimen Description BLOOD BLOOD RIGHT HAND  Final   Special Requests   Final    BOTTLES DRAWN AEROBIC ONLY Blood Culture results may not be optimal due to an inadequate volume of blood received  in culture bottles   Culture   Final    NO GROWTH 5 DAYS Performed at [facility], [address] [place], [place] [address]    Report Status 02/26/2020 FINAL  Final  MRSA PCR Screening     Status: None   Collection Time: 02/21/20  4:14 PM   Specimen: Nasal Mucosa; Nasopharyngeal  Result Value Ref Range Status   MRSA by PCR NEGATIVE NEGATIVE Final    Comment:        The GeneXpert MRSA Assay (FDA approved for NASAL specimens only), is one component of a comprehensive MRSA colonization surveillance program. It is not intended to diagnose MRSA infection nor to guide or monitor treatment for MRSA infections. Performed at [facility], [address] [place], [place] [address]      Studies: MR LUMBAR SPINE WO CONTRAST  Result Date: 02/28/2020 CLINICAL DATA:  Low back pain for 6 weeks EXAM: MRI LUMBAR SPINE WITHOUT CONTRAST TECHNIQUE: Multiplanar, multisequence MR imaging of the lumbar spine was performed. No intravenous contrast was administered. COMPARISON:  07/08/2016 FINDINGS: Segmentation:  Standard. Alignment:  Grade 1 anterolisthesis at L4-5 Vertebrae: Discogenic endplate signal changes at L1-2. L5 M angioma, unchanged. Conus medullaris and cauda equina: Conus extends to the L1 level. Conus and cauda equina appear normal. Paraspinal and other soft tissues: Negative Disc levels: L1-L2: Disc space narrowing with mild bulge. There is no spinal canal stenosis. No neural foraminal stenosis. L2-L3: Unchanged intermediate disc bulge. Narrowing of both  lateral recesses without central spinal canal stenosis. No neural foraminal stenosis. L3-L4: Unchanged disc bulge with superimposed central protrusion and mild facet hypertrophy. There is narrowing of the right lateral recess with mild central spinal canal stenosis. No neural foraminal stenosis. L4-L5: Unchanged severe facet hypertrophy. Worsened disc bulge. Moderate spinal canal stenosis. Worsened mild right and severe left neural foraminal stenosis. L5-S1: Normal disc space and facet joints. There is no spinal canal stenosis. No neural foraminal stenosis. Visualized sacrum: Normal. IMPRESSION: 1. Worsened disc bulge at L4-L5 with severe facet arthrosis and moderate spinal canal stenosis with mild right, severe left neural foraminal stenosis. 2. Unchanged L3-4 mild central spinal canal stenosis and right lateral recess stenosis. Electronically Signed   By: Ulyses Jarred M.D.   On: 02/28/2020 21:01    Scheduled Meds: . atorvastatin  20 mg Oral Q2200  . budesonide (PULMICORT) nebulizer solution  0.5 mg Nebulization BID  . buPROPion  150 mg Oral Daily  . Chlorhexidine Gluconate Cloth  6 each Topical Daily  . diclofenac Sodium  2 g Topical QID  . enoxaparin (LOVENOX) injection  40 mg Subcutaneous BID  . furosemide  40 mg Oral Daily  . gabapentin  200 mg Oral TID  . insulin aspart  0-20 Units Subcutaneous TID WC  . insulin aspart  0-5 Units Subcutaneous QHS  . levothyroxine  250 mcg Oral q morning - 10a  . lidocaine  1 patch Transdermal Daily  . methylPREDNISolone (SOLU-MEDROL) injection  40 mg Intravenous Daily  . multivitamin with minerals  1 tablet Oral Daily  . potassium chloride  20 mEq Oral Daily  . rOPINIRole  3 mg Oral TID   Continuous Infusions: . sodium chloride Stopped (02/22/20 0732)    Assessment/Plan:  1. Lumbar radiculopathy.  Disc bulge L4-L5 with severe facet arthrosis and moderate spinal canal stenosis.  Started Solu-Medrol yesterday.  Continue while here.  Switch over to  prednisone upon discharge to rehab. 2. Septic shock lower extremity cellulitis and community-acquired pneumonia.  Finished antibiotics. 3. Acute hypoxic respiratory failure.  Able to come off oxygen during the day.  Needs oxygen with CPAP at night. 4. Acute metabolic encephalopathy this has resolved 5. Type 2 diabetes mellitus.  Peripheral neuropathy.  Hemoglobin A1c 7.4 back in March.  On gabapentin. 6. Hypothyroidism unspecified on levothyroxine 7. Morbid obesity with a BMI of 53.25.  Weight loss needed 8. Left wrist carpal tunnel with left wrist brace.   Code Status:     Code Status Orders  (From admission, onward)         Start     Ordered   02/21/20 0559  Full code  Continuous        02/21/20 0600        Code Status History    This patient has a current code status but no historical code status.   Advance Care Planning Activity      Disposition Plan: Status is: Inpatient  Dispo:  Patient From: Home  Planned Disposition: [place]  Expected discharge date: Whenever insurance company approves rehab.  Medically stable for discharge: Yes  Time spent: 28 minutes  [place]

## 2020-02-29 NOTE — Progress Notes (Signed)
Pt desats while sleeping with cpap. O2 increased to 3.5L. Saturating at 93,

## 2020-03-01 LAB — GLUCOSE, CAPILLARY
Glucose-Capillary: 108 mg/dL — ABNORMAL HIGH (ref 70–99)
Glucose-Capillary: 100 mg/dL — ABNORMAL HIGH (ref 70–99)

## 2020-03-01 MED ORDER — PREDNISONE 10 MG PO TABS: ORAL_TABLET | ORAL | 0 refills | Status: DC

## 2020-03-01 MED ORDER — METHOCARBAMOL 500 MG PO TABS: 500.0000 mg | ORAL_TABLET | Freq: Three times a day (TID) | ORAL | 0 refills | Status: DC | PRN

## 2020-03-01 MED ORDER — NYSTATIN 100000 UNIT/GM EX POWD
Freq: Three times a day (TID) | CUTANEOUS | Status: DC
  Filled 2020-03-01: qty 15

## 2020-03-01 MED ORDER — NYSTATIN 100000 UNIT/GM EX POWD
Freq: Three times a day (TID) | CUTANEOUS | 0 refills | Status: DC
Start: 2020-03-01 — End: 2021-03-24

## 2020-03-01 MED ORDER — GABAPENTIN 100 MG PO CAPS: 200.0000 mg | ORAL_CAPSULE | Freq: Three times a day (TID) | ORAL | 0 refills | Status: DC

## 2020-03-01 MED ORDER — TRAMADOL HCL 50 MG PO TABS: 50.0000 mg | ORAL_TABLET | Freq: Two times a day (BID) | ORAL | 0 refills | Status: AC | PRN

## 2020-03-01 NOTE — TOC Progression Note (Signed)
Transition of Care [facility]) - Progression Note    Patient Details  Name: Madison Mcbride MRN: [id number] Date of Birth: [birth year]  [facility] CM/SW Contact  Shelbie Ammons, RN Phone Number: 03/01/2020, 10:45 AM  Clinical Narrative:   RNCM touched base with Gerald Stabs at [facility], he reports that he sent additional clinicals yesterday afternoon and thinks there should be a determination today.     Expected Discharge Plan: [place] Barriers to Discharge: No Barriers Identified  Expected Discharge Plan and Services Expected Discharge Plan: [place]   Discharge Planning Services: CM Consult Post Acute Care Choice: [place] Living arrangements for the past 2 months: Single Family Home Expected Discharge Date: 02/27/20                                     Social Determinants of Health (SDOH) Interventions    Readmission Risk Interventions No flowsheet data found.

## 2020-03-01 NOTE — Progress Notes (Signed)
Pt d/c to [facility] this afternoon via EMS.  All belongings were taken at time of d/c.  This nurse called report to nurse Altha Harm at [facility] earlier in the day.  IVs removed from pts L AC and R FA without issue.  NAD noted VS WNL.

## 2020-03-01 NOTE — Discharge Summary (Addendum)
[facility] at [facility] NAME: Madison Buenaventura    MR#:  [id number]  DATE OF BIRTH:  09-24-1955  DATE OF ADMISSION:  02/20/2020 ADMITTING PHYSICIAN: Fritzi Mandes, MD  DATE OF DISCHARGE: 03/01/20  PRIMARY CARE PHYSICIAN: Rusty Aus, MD    ADMISSION DIAGNOSIS:  Cellulitis of left lower extremity [L03.116] Cellulitis of right lower extremity [L03.115] AKI (acute kidney injury) ([place]) [N17.9] Sepsis ([place]) [A41.9] Severe sepsis ([place]) [A41.9, R65.20] Diabetic polyneuropathy associated with type 2 diabetes mellitus ([place]) [E11.42] Sepsis, due to unspecified organism, unspecified whether acute organ dysfunction present ([place]) [A41.9]  DISCHARGE DIAGNOSIS:  Principal Problem:   Severe sepsis ([place]) Active Problems:   Type 2 diabetes mellitus with diabetic neuropathy, without long-term current use of insulin ([place])   Hypothyroidism   Morbid obesity with BMI of 50.0-59.9, adult ([place])   CAP (community acquired pneumonia)   Bilateral cellulitis of lower leg   Acute respiratory failure with hypoxia ([place])   AKI (acute kidney injury) ([place])   Acute metabolic encephalopathy   Septic shock (H[facility])   Acute midline low back pain without sciatica   Lumbar radiculopathy, acute   SECONDARY DIAGNOSIS:   Past Medical History:  Diagnosis Date  . Diabetes mellitus without complication ([place])   . Hypothyroidism   . RLS (restless legs syndrome)   . Thyroid cancer ([place]) 2007    HOSPITAL COURSE:   1.  Lumbar radiculopathy.  Patient has a disc bulge L4-L5 with severe facet arthrosis and moderate spinal canal stenosis.  Patient also has a tightening of where the left nerve roots come out.  Continue Solu-Medrol while here in the hospital and switch over to a prednisone taper upon going out to rehab.  Can follow-up with neurosurgery as outpatient if still with them still bothersome upon discharge home from rehab.  Can consider Mobic after prednisone finished. 2.  Septic  shock secondary to lower extremity cellulitis and community-acquired pneumonia.  Patient finished antibiotics during the hospital course. 3.  Acute hypoxic respiratory failure.  The patient was able to come off oxygen during the day.  Does have oxygen 2 L with the CPAP at night. 4.  Acute metabolic encephalopathy this has resolved 5.  Type 2 diabetes mellitus mellitus with peripheral neuropathy.  On gabapentin.  Patient on Ozempic.  Sugars likely be a little higher with steroids. 6.  Hypothyroidism unspecified on levothyroxine 7.  Morbid obesity with a BMI of 53.25. 8.  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  DISCHARGE CONDITIONS:   Satisfactory  CONSULTS OBTAINED:  None  DRUG ALLERGIES:  No Known Allergies  DISCHARGE MEDICATIONS:   Allergies as of 03/01/2020   No Known Allergies     Medication List    STOP taking these medications   metolazone 2.5 MG tablet Commonly known as: ZAROXOLYN   propranolol 20 MG tablet Commonly known as: INDERAL   triamterene-hydrochlorothiazide 75-50 MG tablet Commonly known as: MAXZIDE   valsartan 160 MG tablet Commonly known as: DIOVAN     TAKE these medications   acetaminophen 500 MG tablet Commonly known as: TYLENOL Take 1,000 mg by mouth every 8 (eight) hours.   atorvastatin 20 MG tablet Commonly known as: LIPITOR Take 20 mg by mouth daily at 10 pm.   Biotin 2.5 MG Caps Take 2.5 mg by mouth daily. Notes to patient: Not given this hospital visit   buPROPion 150 MG 24 hr tablet Commonly known as: WELLBUTRIN XL Take 150 mg by mouth  daily.   diclofenac Sodium 1 % Gel Commonly known as: VOLTAREN Apply 2 g topically 4 (four) times daily.   furosemide 40 MG tablet Commonly known as: LASIX Take 40 mg by mouth daily.   gabapentin 100 MG capsule Commonly known as: NEURONTIN Take 2 capsules (200 mg total) by mouth 3 (three) times daily. What changed:   medication strength  how much to take  when to take  this  reasons to take this   lidocaine 5 % Commonly known as: LIDODERM Place 1 patch onto the skin See admin instructions. Apply 1 patch on the skin for 12 hours each day and then remove.   methocarbamol 500 MG tablet Commonly known as: ROBAXIN Take 1 tablet (500 mg total) by mouth every 8 (eight) hours as needed for muscle spasms.   multivitamin tablet Take 1 tablet by mouth daily.   nystatin powder Commonly known as: MYCOSTATIN/NYSTOP Apply topically 3 (three) times daily.   Ozempic (0.25 or 0.5 MG/DOSE) 2 MG/1.5ML Sopn Generic drug: Semaglutide(0.25 or 0.5MG /DOS) Inject 0.375 mLs into the skin every Saturday.   potassium chloride SA 20 MEQ tablet Commonly known as: KLOR-CON Take 1 tablet (20 mEq total) by mouth daily.   predniSONE 10 MG tablet Commonly known as: DELTASONE 3 tabs po for three days; 2 tabs po for three days; 1 tab po for three days; 1/2 tab po for four days   rOPINIRole 3 MG tablet Commonly known as: REQUIP Take 3 mg by mouth 3 (three) times daily.   Synthroid 125 MCG tablet Generic drug: levothyroxine Take 250 mcg by mouth every morning.   traMADol 50 MG tablet Commonly known as: ULTRAM Take 1 tablet (50 mg total) by mouth every 12 (twelve) hours as needed for up to 3 days for severe pain.        DISCHARGE INSTRUCTIONS:   Follow-up Dr. At rehab 1 day  If you experience worsening of your admission symptoms, develop shortness of breath, life threatening emergency, suicidal or homicidal thoughts you must seek medical attention immediately by calling 911 or calling your MD immediately  if symptoms less severe.  You Must read complete instructions/literature along with all the possible adverse reactions/side effects for all the Medicines you take and that have been prescribed to you. Take any new Medicines after you have completely understood and accept all the possible adverse reactions/side effects.   Please note  You were cared for by a  hospitalist during your hospital stay. If you have any questions about your discharge medications or the care you received while you were in the hospital after you are discharged, you can call the unit and asked to speak with the hospitalist on call if the hospitalist that took care of you is not available. Once you are discharged, your primary care physician will handle any further medical issues. Please note that NO REFILLS for any discharge medications will be authorized once you are discharged, as it is imperative that you return to your primary care physician (or establish a relationship with a primary care physician if you do not have one) for your aftercare needs so that they can reassess your need for medications and monitor your lab values.    Today   CHIEF COMPLAINT:   Chief Complaint  Patient presents with  . Foot Pain    HISTORY OF PRESENT ILLNESS:  Taylinn Mcbride  is a 64 y.o. female came in initially with back pain   VITAL SIGNS:  Blood pressure 116/64, pulse  71, temperature 98.5 F (36.9 C), temperature source Oral, resp. rate 16, height 5\' 5"  (1.651 m), weight (!) 145.2 kg, SpO2 95 %.  I/O:    Intake/Output Summary (Last 24 hours) at 03/01/2020 1121 Last data filed at 03/01/2020 1027 Gross per 24 hour  Intake 720 ml  Output 300 ml  Net 420 ml    PHYSICAL EXAMINATION:  GENERAL:  64 y.o.-year-old patient lying in the bed with no acute distress.  EYES: Pupils equal, round, reactive to light and accommodation. No scleral icterus. Extraocular muscles intact.  HEENT: Head atraumatic, normocephalic. Oropharynx and nasopharynx clear.  LUNGS: Normal breath sounds bilaterally, no wheezing, rales,rhonchi or crepitation. No use of accessory muscles of respiration.  CARDIOVASCULAR: S1, S2 normal. No murmurs, rubs, or gallops.  ABDOMEN: Soft, non-tender, non-distended.  EXTREMITIES: Trace pedal edema.  NEUROLOGIC: Cranial nerves II through XII are intact.  Patient able to  straight leg raise today little bit better than yesterday. PSYCHIATRIC: The patient is alert and oriented x 3.  SKIN: Chronic lower extremity discoloration  DATA REVIEW:   CBC Recent Labs  Lab 02/26/20 0302  WBC 12.5*  HGB 10.3*  HCT 33.7*  PLT 401*    Chemistries  Recent Labs  Lab 02/25/20 0534 02/25/20 0534 02/26/20 0302 02/26/20 0302 02/27/20 0514 02/27/20 0514 02/28/20 0516  NA 145   < > 143   < > 140  --   --   K 4.3   < > 3.5   < > 3.7  --   --   CL 104   < > 96*   < > 93*  --   --   CO2 30   < > 35*   < > 35*  --   --   GLUCOSE 104*   < > 111*   < > 108*  --   --   BUN 38*   < > 30*   < > 22  --   --   CREATININE 1.11*   < > 1.11*   < > 0.97   < > 0.85  CALCIUM 8.7*   < > 8.9   < > 8.7*  --   --   MG 2.1  --   --   --   --   --   --   AST 36   < > 29  --   --   --   --   ALT 28   < > 24  --   --   --   --   ALKPHOS 112   < > 115  --   --   --   --   BILITOT 0.8   < > 1.0  --   --   --   --    < > = values in this interval not displayed.    Microbiology Results  Results for orders placed or performed during the hospital encounter of 02/20/20  Blood culture (routine x 2)     Status: None   Collection Time: 02/21/20  2:30 AM   Specimen: BLOOD  Result Value Ref Range Status   Specimen Description BLOOD LEFT FA  Final   Special Requests   Final    BOTTLES DRAWN AEROBIC AND ANAEROBIC Blood Culture adequate volume   Culture   Final    NO GROWTH 5 DAYS Performed at [facility], [address], [place], [place] [address]    Report Status 02/26/2020 FINAL  Final  Blood culture (routine x 2)     Status: None   Collection Time: 02/21/20  2:30 AM   Specimen: BLOOD  Result Value Ref Range Status   Specimen Description BLOOD RIGHT FA  Final   Special Requests   Final    BOTTLES DRAWN AEROBIC AND ANAEROBIC Blood Culture adequate volume   Culture   Final    NO GROWTH 5 DAYS Performed at [facility], [address] [place], [place]  [address]    Report Status 02/26/2020 FINAL  Final  SARS Coronavirus 2 by RT PCR (hospital order, performed in [facility] hospital lab) Nasopharyngeal Nasopharyngeal Swab     Status: None   Collection Time: 02/21/20  2:30 AM   Specimen: Nasopharyngeal Swab  Result Value Ref Range Status   SARS Coronavirus 2 NEGATIVE NEGATIVE Final    Comment: (NOTE) SARS-CoV-2 target nucleic acids are NOT DETECTED.  The SARS-CoV-2 RNA is generally detectable in upper and lower respiratory specimens during the acute phase of infection. The lowest concentration of SARS-CoV-2 viral copies this assay can detect is 250 copies / mL. A negative result does not preclude SARS-CoV-2 infection and should not be used as the sole basis for treatment or other patient management decisions.  A negative result may occur with improper specimen collection / handling, submission of specimen other than nasopharyngeal swab, presence of viral mutation(s) within the areas targeted by this assay, and inadequate number of viral copies (<250 copies / mL). A negative result must be combined with clinical observations, patient history, and epidemiological information.  Fact Sheet for Patients:   StrictlyIdeas.no  Fact Sheet for Healthcare Providers: BankingDealers.co.za  This test is not yet approved or  cleared by the [place] FDA and has been authorized for detection and/or diagnosis of SARS-CoV-2 by FDA under an Emergency Use Authorization (EUA).  This EUA will remain in effect (meaning this test can be used) for the duration of the COVID-19 declaration under Section 564(b)(1) of the Act, 21 U.S.C. section 360bbb-3(b)(1), unless the authorization is terminated or revoked sooner.  Performed at [facility], [address] [place], [place] [address]   Culture, blood (single)     Status: None   Collection Time: 02/21/20  9:35 AM   Specimen: BLOOD  Result Value  Ref Range Status   Specimen Description BLOOD BLOOD RIGHT HAND  Final   Special Requests   Final    BOTTLES DRAWN AEROBIC ONLY Blood Culture results may not be optimal due to an inadequate volume of blood received in culture bottles   Culture   Final    NO GROWTH 5 DAYS Performed at [facility], [address] [place], [place] [address]    Report Status 02/26/2020 FINAL  Final  MRSA PCR Screening     Status: None   Collection Time: 02/21/20  4:14 PM   Specimen: Nasal Mucosa; Nasopharyngeal  Result Value Ref Range Status   MRSA by PCR NEGATIVE NEGATIVE Final    Comment:        The GeneXpert MRSA Assay (FDA approved for NASAL specimens only), is one component of a comprehensive MRSA colonization surveillance program. It is not intended to diagnose MRSA infection nor to guide or monitor treatment for MRSA infections. Performed at [facility], [address] [place], [place] [address]     RADIOLOGY:  MR LUMBAR SPINE WO CONTRAST  Result Date: 02/28/2020 CLINICAL DATA:  Low back pain for 6 weeks EXAM: MRI LUMBAR SPINE WITHOUT  CONTRAST TECHNIQUE: Multiplanar, multisequence MR imaging of the lumbar spine was performed. No intravenous contrast was administered. COMPARISON:  07/08/2016 FINDINGS: Segmentation:  Standard. Alignment:  Grade 1 anterolisthesis at L4-5 Vertebrae: Discogenic endplate signal changes at L1-2. L5 M angioma, unchanged. Conus medullaris and cauda equina: Conus extends to the L1 level. Conus and cauda equina appear normal. Paraspinal and other soft tissues: Negative Disc levels: L1-L2: Disc space narrowing with mild bulge. There is no spinal canal stenosis. No neural foraminal stenosis. L2-L3: Unchanged intermediate disc bulge. Narrowing of both lateral recesses without central spinal canal stenosis. No neural foraminal stenosis. L3-L4: Unchanged disc bulge with superimposed central protrusion and mild facet hypertrophy. There is narrowing of the  right lateral recess with mild central spinal canal stenosis. No neural foraminal stenosis. L4-L5: Unchanged severe facet hypertrophy. Worsened disc bulge. Moderate spinal canal stenosis. Worsened mild right and severe left neural foraminal stenosis. L5-S1: Normal disc space and facet joints. There is no spinal canal stenosis. No neural foraminal stenosis. Visualized sacrum: Normal. IMPRESSION: 1. Worsened disc bulge at L4-L5 with severe facet arthrosis and moderate spinal canal stenosis with mild right, severe left neural foraminal stenosis. 2. Unchanged L3-4 mild central spinal canal stenosis and right lateral recess stenosis. Electronically Signed   By: Ulyses Jarred M.D.   On: 02/28/2020 21:01    Management plans discussed with the patient, and she is in agreement.  CODE STATUS:     Code Status Orders  (From admission, onward)         Start     Ordered   02/21/20 0559  Full code  Continuous        02/21/20 0600        Code Status History    This patient has a current code status but no historical code status.   Advance Care Planning Activity      TOTAL TIME TAKING CARE OF THIS PATIENT: 32 minutes.    Loletha Grayer M.D on 03/01/2020 at 11:21 AM  Between 7am to 6pm - Pager - [phone]  After 6pm go to [email or web address] - password [email or web address]  [facility]  CC: Primary care physician; Rusty Aus, MD

## 2020-03-01 NOTE — Progress Notes (Signed)
Report called to Engineer, maintenance at [employer].  Pt is dressed and ready for EMS transport.  EMS was called by [facility] pt is currently 3-4th on transportation list.

## 2020-03-01 NOTE — Plan of Care (Signed)

## 2020-04-09 ENCOUNTER — Other Ambulatory Visit: Payer: Self-pay

## 2020-04-09 ENCOUNTER — Inpatient Hospital Stay
Admission: EM | Admit: 2020-04-09 | Discharge: 2020-04-18 | DRG: 291 | Disposition: A | Discharge status: Skilled Nursing Facility | Payer: BC Managed Care – PPO | Attending: Internal Medicine | Admitting: Internal Medicine

## 2020-04-09 ENCOUNTER — Emergency Department: Payer: BC Managed Care – PPO

## 2020-04-09 DIAGNOSIS — J9601 Acute respiratory failure with hypoxia: Secondary | ICD-10-CM | POA: Diagnosis not present

## 2020-04-09 DIAGNOSIS — T502X5A Adverse effect of carbonic-anhydrase inhibitors, benzothiadiazides and other diuretics, initial encounter: Secondary | ICD-10-CM | POA: Diagnosis present

## 2020-04-09 DIAGNOSIS — J9622 Acute and chronic respiratory failure with hypercapnia: Secondary | ICD-10-CM | POA: Diagnosis present

## 2020-04-09 DIAGNOSIS — Z791 Long term (current) use of non-steroidal anti-inflammatories (NSAID): Secondary | ICD-10-CM | POA: Diagnosis not present

## 2020-04-09 DIAGNOSIS — G4733 Obstructive sleep apnea (adult) (pediatric): Secondary | ICD-10-CM | POA: Diagnosis present

## 2020-04-09 DIAGNOSIS — E872 Acidosis: Secondary | ICD-10-CM | POA: Diagnosis present

## 2020-04-09 DIAGNOSIS — E785 Hyperlipidemia, unspecified: Secondary | ICD-10-CM | POA: Diagnosis present

## 2020-04-09 DIAGNOSIS — E662 Morbid (severe) obesity with alveolar hypoventilation: Secondary | ICD-10-CM | POA: Diagnosis present

## 2020-04-09 DIAGNOSIS — N390 Urinary tract infection, site not specified: Secondary | ICD-10-CM | POA: Diagnosis present

## 2020-04-09 DIAGNOSIS — F32A Depression, unspecified: Secondary | ICD-10-CM | POA: Diagnosis present

## 2020-04-09 DIAGNOSIS — G928 Other toxic encephalopathy: Secondary | ICD-10-CM | POA: Diagnosis not present

## 2020-04-09 DIAGNOSIS — I719 Aortic aneurysm of unspecified site, without rupture: Secondary | ICD-10-CM | POA: Diagnosis present

## 2020-04-09 DIAGNOSIS — D631 Anemia in chronic kidney disease: Secondary | ICD-10-CM | POA: Diagnosis present

## 2020-04-09 DIAGNOSIS — Z9289 Personal history of other medical treatment: Secondary | ICD-10-CM

## 2020-04-09 DIAGNOSIS — Z20822 Contact with and (suspected) exposure to covid-19: Secondary | ICD-10-CM | POA: Diagnosis present

## 2020-04-09 DIAGNOSIS — N179 Acute kidney failure, unspecified: Secondary | ICD-10-CM | POA: Diagnosis not present

## 2020-04-09 DIAGNOSIS — E039 Hypothyroidism, unspecified: Secondary | ICD-10-CM | POA: Diagnosis present

## 2020-04-09 DIAGNOSIS — R7989 Other specified abnormal findings of blood chemistry: Secondary | ICD-10-CM | POA: Diagnosis present

## 2020-04-09 DIAGNOSIS — J9621 Acute and chronic respiratory failure with hypoxia: Secondary | ICD-10-CM | POA: Diagnosis present

## 2020-04-09 DIAGNOSIS — Z85850 Personal history of malignant neoplasm of thyroid: Secondary | ICD-10-CM | POA: Diagnosis not present

## 2020-04-09 DIAGNOSIS — R9389 Abnormal findings on diagnostic imaging of other specified body structures: Secondary | ICD-10-CM

## 2020-04-09 DIAGNOSIS — Z6841 Body Mass Index (BMI) 40.0 and over, adult: Secondary | ICD-10-CM | POA: Diagnosis not present

## 2020-04-09 DIAGNOSIS — I130 Hypertensive heart and chronic kidney disease with heart failure and stage 1 through stage 4 chronic kidney disease, or unspecified chronic kidney disease: Principal | ICD-10-CM | POA: Diagnosis present

## 2020-04-09 DIAGNOSIS — N1831 Chronic kidney disease, stage 3a: Secondary | ICD-10-CM

## 2020-04-09 DIAGNOSIS — Z79890 Hormone replacement therapy (postmenopausal): Secondary | ICD-10-CM

## 2020-04-09 DIAGNOSIS — I959 Hypotension, unspecified: Secondary | ICD-10-CM | POA: Diagnosis not present

## 2020-04-09 DIAGNOSIS — F329 Major depressive disorder, single episode, unspecified: Secondary | ICD-10-CM | POA: Diagnosis present

## 2020-04-09 DIAGNOSIS — G2581 Restless legs syndrome: Secondary | ICD-10-CM | POA: Diagnosis present

## 2020-04-09 DIAGNOSIS — E890 Postprocedural hypothyroidism: Secondary | ICD-10-CM | POA: Diagnosis present

## 2020-04-09 DIAGNOSIS — G894 Chronic pain syndrome: Secondary | ICD-10-CM | POA: Diagnosis present

## 2020-04-09 DIAGNOSIS — T402X5A Adverse effect of other opioids, initial encounter: Secondary | ICD-10-CM | POA: Diagnosis not present

## 2020-04-09 DIAGNOSIS — E119 Type 2 diabetes mellitus without complications: Secondary | ICD-10-CM

## 2020-04-09 DIAGNOSIS — Z79899 Other long term (current) drug therapy: Secondary | ICD-10-CM

## 2020-04-09 DIAGNOSIS — G253 Myoclonus: Secondary | ICD-10-CM | POA: Diagnosis not present

## 2020-04-09 DIAGNOSIS — E1122 Type 2 diabetes mellitus with diabetic chronic kidney disease: Secondary | ICD-10-CM | POA: Diagnosis present

## 2020-04-09 DIAGNOSIS — I5033 Acute on chronic diastolic (congestive) heart failure: Secondary | ICD-10-CM | POA: Diagnosis present

## 2020-04-09 DIAGNOSIS — I509 Heart failure, unspecified: Secondary | ICD-10-CM

## 2020-04-09 DIAGNOSIS — M48061 Spinal stenosis, lumbar region without neurogenic claudication: Secondary | ICD-10-CM | POA: Diagnosis present

## 2020-04-09 DIAGNOSIS — R778 Other specified abnormalities of plasma proteins: Secondary | ICD-10-CM

## 2020-04-09 DIAGNOSIS — B9620 Unspecified Escherichia coli [E. coli] as the cause of diseases classified elsewhere: Secondary | ICD-10-CM | POA: Diagnosis present

## 2020-04-09 DIAGNOSIS — J9602 Acute respiratory failure with hypercapnia: Secondary | ICD-10-CM

## 2020-04-09 DIAGNOSIS — Z90710 Acquired absence of both cervix and uterus: Secondary | ICD-10-CM

## 2020-04-09 DIAGNOSIS — R059 Cough, unspecified: Secondary | ICD-10-CM

## 2020-04-09 LAB — HEMOGLOBIN A1C
Hgb A1c MFr Bld: 5.8 % — ABNORMAL HIGH (ref 4.8–5.6)
Mean Plasma Glucose: 119.76 mg/dL

## 2020-04-09 LAB — TROPONIN I (HIGH SENSITIVITY)
Troponin I (High Sensitivity): 24 ng/L — ABNORMAL HIGH (ref <18)
Troponin I (High Sensitivity): 27 ng/L — ABNORMAL HIGH (ref <18)
Troponin I (High Sensitivity): 27 ng/L — ABNORMAL HIGH (ref <18)
Troponin I (High Sensitivity): 29 ng/L — ABNORMAL HIGH (ref <18)

## 2020-04-09 LAB — GLUCOSE, CAPILLARY
Glucose-Capillary: 66 mg/dL — ABNORMAL LOW (ref 70–99)
Glucose-Capillary: 71 mg/dL (ref 70–99)
Glucose-Capillary: 75 mg/dL (ref 70–99)
Glucose-Capillary: 80 mg/dL (ref 70–99)
Glucose-Capillary: 92 mg/dL (ref 70–99)

## 2020-04-09 LAB — BASIC METABOLIC PANEL
Anion gap: 11 (ref 5–15)
BUN: 63 mg/dL — ABNORMAL HIGH (ref 8–23)
CO2: 28 mmol/L (ref 22–32)
Calcium: 8.4 mg/dL — ABNORMAL LOW (ref 8.9–10.3)
Chloride: 98 mmol/L (ref 98–111)
Creatinine, Ser: 2.23 mg/dL — ABNORMAL HIGH (ref 0.44–1.00)
GFR calc Af Amer: 26 mL/min — ABNORMAL LOW (ref >60)
GFR calc non Af Amer: 22 mL/min — ABNORMAL LOW (ref >60)
Glucose, Bld: 100 mg/dL — ABNORMAL HIGH (ref 70–99)
Potassium: 4.7 mmol/L (ref 3.5–5.1)
Sodium: 137 mmol/L (ref 135–145)

## 2020-04-09 LAB — BLOOD GAS, VENOUS
Acid-Base Excess: 6 mmol/L — ABNORMAL HIGH (ref 0.0–2.0)
Bicarbonate: 35.2 mmol/L — ABNORMAL HIGH (ref 20.0–28.0)
FIO2: 0.21
O2 Saturation: 85.3 %
Patient temperature: 34
pCO2, Ven: 75 mmHg (ref 44.0–60.0)
pH, Ven: 7.32 (ref 7.250–7.430)
pO2, Ven: 46 mmHg — ABNORMAL HIGH (ref 32.0–45.0)

## 2020-04-09 LAB — CBC WITH DIFFERENTIAL/PLATELET
Abs Immature Granulocytes: 0.08 10*3/uL — ABNORMAL HIGH (ref 0.00–0.07)
Basophils Absolute: 0.1 10*3/uL (ref 0.0–0.1)
Basophils Relative: 1 %
Eosinophils Absolute: 0.2 10*3/uL (ref 0.0–0.5)
Eosinophils Relative: 2 %
HCT: 41.3 % (ref 36.0–46.0)
Hemoglobin: 12.2 g/dL (ref 12.0–15.0)
Immature Granulocytes: 1 %
Lymphocytes Relative: 18 %
Lymphs Abs: 2.2 10*3/uL (ref 0.7–4.0)
MCH: 28.7 pg (ref 26.0–34.0)
MCHC: 29.5 g/dL — ABNORMAL LOW (ref 30.0–36.0)
MCV: 97.2 fL (ref 80.0–100.0)
Monocytes Absolute: 1 10*3/uL (ref 0.1–1.0)
Monocytes Relative: 9 %
Neutro Abs: 8.3 10*3/uL — ABNORMAL HIGH (ref 1.7–7.7)
Neutrophils Relative %: 69 %
Platelets: 445 10*3/uL — ABNORMAL HIGH (ref 150–400)
RBC: 4.25 MIL/uL (ref 3.87–5.11)
RDW: 22.4 % — ABNORMAL HIGH (ref 11.5–15.5)
Smear Review: NORMAL
WBC: 11.8 10*3/uL — ABNORMAL HIGH (ref 4.0–10.5)
nRBC: 2.7 % — ABNORMAL HIGH (ref 0.0–0.2)

## 2020-04-09 LAB — RESPIRATORY PANEL BY RT PCR (FLU A&B, COVID)
Influenza A by PCR: NEGATIVE
Influenza B by PCR: NEGATIVE
SARS Coronavirus 2 by RT PCR: NEGATIVE

## 2020-04-09 LAB — BRAIN NATRIURETIC PEPTIDE: B Natriuretic Peptide: 941.8 pg/mL — ABNORMAL HIGH (ref 0.0–100.0)

## 2020-04-09 LAB — TSH: TSH: 0.738 u[IU]/mL (ref 0.350–4.500)

## 2020-04-09 MED ORDER — ENOXAPARIN SODIUM 40 MG/0.4ML ~~LOC~~ SOLN: 40.0000 mg | SUBCUTANEOUS | Status: DC

## 2020-04-09 MED ORDER — LEVOTHYROXINE SODIUM 50 MCG PO TABS
250.0000 ug | ORAL_TABLET | Freq: Every day | ORAL | Status: DC
  Administered 2020-04-10 – 2020-04-18 (×9): 250 ug via ORAL
  Filled 2020-04-09 (×9): qty 1

## 2020-04-09 MED ORDER — INSULIN ASPART 100 UNIT/ML ~~LOC~~ SOLN
0.0000 [IU] | Freq: Three times a day (TID) | SUBCUTANEOUS | Status: DC
  Administered 2020-04-12: 1 [IU] via SUBCUTANEOUS
  Filled 2020-04-09 (×3): qty 1

## 2020-04-09 MED ORDER — ALBUTEROL SULFATE HFA 108 (90 BASE) MCG/ACT IN AERS
2.0000 | INHALATION_SPRAY | RESPIRATORY_TRACT | Status: DC | PRN
  Filled 2020-04-09: qty 6.7

## 2020-04-09 MED ORDER — FUROSEMIDE 10 MG/ML IJ SOLN
40.0000 mg | Freq: Once | INTRAMUSCULAR | Status: AC
  Administered 2020-04-09: 40 mg via INTRAVENOUS
  Filled 2020-04-09: qty 4

## 2020-04-09 MED ORDER — ASPIRIN EC 81 MG PO TBEC
81.0000 mg | DELAYED_RELEASE_TABLET | Freq: Every day | ORAL | Status: DC
  Administered 2020-04-09 – 2020-04-18 (×10): 81 mg via ORAL
  Filled 2020-04-09 (×10): qty 1

## 2020-04-09 MED ORDER — SODIUM CHLORIDE 0.9% FLUSH
3.0000 mL | Freq: Two times a day (BID) | INTRAVENOUS | Status: DC
  Administered 2020-04-10 – 2020-04-18 (×17): 3 mL via INTRAVENOUS

## 2020-04-09 MED ORDER — VITAMIN D3 25 MCG (1000 UNIT) PO TABS
1000.0000 [IU] | ORAL_TABLET | Freq: Every day | ORAL | Status: DC
  Administered 2020-04-09 – 2020-04-18 (×10): 1000 [IU] via ORAL
  Filled 2020-04-09 (×19): qty 1

## 2020-04-09 MED ORDER — BUPROPION HCL ER (SR) 150 MG PO TB12
150.0000 mg | ORAL_TABLET | Freq: Every day | ORAL | Status: DC
  Administered 2020-04-10 – 2020-04-18 (×9): 150 mg via ORAL
  Filled 2020-04-09 (×9): qty 1

## 2020-04-09 MED ORDER — BIOTIN 2.5 MG PO CAPS: 2.5000 mg | ORAL_CAPSULE | Freq: Every day | ORAL | Status: DC

## 2020-04-09 MED ORDER — DM-GUAIFENESIN ER 30-600 MG PO TB12
1.0000 | ORAL_TABLET | Freq: Two times a day (BID) | ORAL | Status: DC | PRN
  Administered 2020-04-16: 1 via ORAL
  Filled 2020-04-09: qty 1

## 2020-04-09 MED ORDER — GABAPENTIN 300 MG PO CAPS
300.0000 mg | ORAL_CAPSULE | Freq: Three times a day (TID) | ORAL | Status: DC
  Administered 2020-04-09 – 2020-04-13 (×10): 300 mg via ORAL
  Filled 2020-04-09 (×10): qty 1

## 2020-04-09 MED ORDER — ENOXAPARIN SODIUM 40 MG/0.4ML ~~LOC~~ SOLN
40.0000 mg | Freq: Two times a day (BID) | SUBCUTANEOUS | Status: DC
  Administered 2020-04-09 – 2020-04-18 (×18): 40 mg via SUBCUTANEOUS
  Filled 2020-04-09 (×18): qty 0.4

## 2020-04-09 MED ORDER — SODIUM CHLORIDE 0.9 % IV SOLN: 250.0000 mL | INTRAVENOUS | Status: DC | PRN

## 2020-04-09 MED ORDER — ZINC SULFATE 220 (50 ZN) MG PO CAPS
220.0000 mg | ORAL_CAPSULE | Freq: Every day | ORAL | Status: DC
  Administered 2020-04-10 – 2020-04-18 (×9): 220 mg via ORAL
  Filled 2020-04-09 (×9): qty 1

## 2020-04-09 MED ORDER — ATORVASTATIN CALCIUM 20 MG PO TABS
20.0000 mg | ORAL_TABLET | Freq: Every day | ORAL | Status: DC
  Administered 2020-04-09 – 2020-04-17 (×8): 20 mg via ORAL
  Filled 2020-04-09 (×8): qty 1

## 2020-04-09 MED ORDER — HYDRALAZINE HCL 20 MG/ML IJ SOLN: 5.0000 mg | INTRAMUSCULAR | Status: DC | PRN

## 2020-04-09 MED ORDER — SODIUM CHLORIDE 0.9% FLUSH
3.0000 mL | INTRAVENOUS | Status: DC | PRN
  Administered 2020-04-09 (×2): 3 mL via INTRAVENOUS

## 2020-04-09 MED ORDER — INSULIN ASPART 100 UNIT/ML ~~LOC~~ SOLN: 0.0000 [IU] | Freq: Every day | SUBCUTANEOUS | Status: DC

## 2020-04-09 MED ORDER — ACETAMINOPHEN 325 MG PO TABS
650.0000 mg | ORAL_TABLET | Freq: Four times a day (QID) | ORAL | Status: DC | PRN
  Administered 2020-04-11 (×2): 650 mg via ORAL
  Filled 2020-04-09 (×3): qty 2

## 2020-04-09 MED ORDER — PROPRANOLOL HCL 20 MG PO TABS
20.0000 mg | ORAL_TABLET | Freq: Three times a day (TID) | ORAL | Status: DC
  Filled 2020-04-09 (×5): qty 1

## 2020-04-09 MED ORDER — FUROSEMIDE 10 MG/ML IJ SOLN
40.0000 mg | Freq: Two times a day (BID) | INTRAMUSCULAR | Status: DC
  Administered 2020-04-10: 40 mg via INTRAVENOUS
  Filled 2020-04-09 (×2): qty 4

## 2020-04-09 MED ORDER — DEXTROSE 50 % IV SOLN
12.5000 g | Freq: Once | INTRAVENOUS | Status: AC
  Administered 2020-04-09: 12.5 g via INTRAVENOUS
  Filled 2020-04-09: qty 50

## 2020-04-09 MED ORDER — ROPINIROLE HCL 1 MG PO TABS
3.0000 mg | ORAL_TABLET | Freq: Three times a day (TID) | ORAL | Status: DC
  Administered 2020-04-09 – 2020-04-13 (×10): 3 mg via ORAL
  Filled 2020-04-09 (×10): qty 3

## 2020-04-09 MED ORDER — NITROGLYCERIN 2 % TD OINT
0.5000 [in_us] | TOPICAL_OINTMENT | Freq: Once | TRANSDERMAL | Status: AC
  Administered 2020-04-09: 0.5 [in_us] via TOPICAL
  Filled 2020-04-09: qty 1

## 2020-04-09 MED ORDER — ADULT MULTIVITAMIN W/MINERALS CH
1.0000 | ORAL_TABLET | Freq: Every day | ORAL | Status: DC
  Administered 2020-04-09 – 2020-04-18 (×10): 1 via ORAL
  Filled 2020-04-09 (×20): qty 1

## 2020-04-09 MED ORDER — HYDROXYZINE HCL 10 MG PO TABS
10.0000 mg | ORAL_TABLET | Freq: Three times a day (TID) | ORAL | Status: DC | PRN
  Filled 2020-04-09: qty 1

## 2020-04-09 MED ORDER — ZINC GLUCONATE 50 MG PO TABS: 50.0000 mg | ORAL_TABLET | Freq: Every day | ORAL | Status: DC

## 2020-04-09 NOTE — ED Triage Notes (Signed)
Pt comes via [facility] from home with c/o SOB for last couple of days. Pt states she was recently admitted to hospital for pneumonia and then d/c to [facility]. Pt has been home for about a week.  EMs reports pt was 66% RA upon their arrival and placed on NRB at 15L and O2 increased to 87-88. EMS states since it went up so fast they then placed pt on 5L [place] and O2 at 94$.  Pt also c/o bilateral leg swelling with redness and wheeping.  MD Charna Archer at bedside

## 2020-04-09 NOTE — ED Notes (Signed)
Report called to [facility]

## 2020-04-09 NOTE — ED Notes (Addendum)
Date and time results received: 04/09/20 1:19 PM   Test: vbg Critical Value: 7.28 75 46 35.2  Name of Provider Notified: Charna Archer MD

## 2020-04-09 NOTE — H&P (Signed)
History and Physical    Madison Mcbride ZDG:[id number] DOB: 10-03-1955 DOA: 04/09/2020  Referring MD/NP/PA:   PCP: Rusty Aus, MD   Patient coming from:  The patient is coming from home.  At baseline, pt is independent for most of ADL.        Chief Complaint: SOB  HPI: Madison Mcbride is a 64 y.o. female with medical history significant of dCHF, hyperlipidemia, diabetes mellitus, hypothyroidism, depression, thyroid cancer, morbid obesity, chronic back pain, OSA on CPAP, CKD-IIIa, who presents with shortness breath.  Patient states that she has been having shortness breath for more than 3 days, which has been progressively worsening.  She has dry cough, denies chest pain, fever or chills.  No nausea, vomiting, diarrhea, abdominal pain, symptoms of UTI or unilateral weakness.  Patient was found to have oxygen desaturation to 65% on room air, which improved with 94% on 5 L oxygen, later BiPAP started in ED.  ED Course: pt was found to have BNP 941, troponin 24, negative Covid PCR, worsening renal function, temperature normal, blood pressure 117/72, heart rate 54, RR 22, VBG with pH 7.32, CO2 75, O2 46. Chest x-ray showed cardiomegaly, interstitial edema.  Patient is admitted to progressive bed as inpatient.  Cardiology, Dr. Nehemiah Massed was consulted.  Review of Systems:   General: no fevers, chills, no body weight gain, has fatigue HEENT: no blurry vision, hearing changes or sore throat Respiratory: has dyspnea, coughing, no wheezing CV: no chest pain, no palpitations GI: no nausea, vomiting, abdominal pain, diarrhea, constipation GU: no dysuria, burning on urination, increased urinary frequency, hematuria  Ext: has leg edema Neuro: no unilateral weakness, numbness, or tingling, no vision change or hearing loss Skin: no rash, no skin tear. MSK: No muscle spasm, no deformity, no limitation of range of movement in spin Heme: No easy bruising.  Travel history: No recent long  distant travel.  Allergy: No Known Allergies  Past Medical History:  Diagnosis Date  . Diabetes mellitus without complication ([place])   . Hypothyroidism   . RLS (restless legs syndrome)   . Thyroid cancer ([place]) 2007    Past Surgical History:  Procedure Laterality Date  . ABDOMINAL HYSTERECTOMY    . COLONOSCOPY WITH PROPOFOL N/A 08/26/2015   Procedure: COLONOSCOPY WITH PROPOFOL;  Surgeon: Manya Silvas, MD;  Location: [facility] ENDOSCOPY;  Service: Endoscopy;  Laterality: N/A;  . THYROIDECTOMY      Social History:  reports that she has never smoked. She has never used smokeless tobacco. She reports that she does not drink alcohol and does not use drugs.  Family History:  Family History  Problem Relation Age of Onset  . Breast cancer Neg Hx      Prior to Admission medications   Medication Sig Start Date End Date Taking? Authorizing Provider  acetaminophen (TYLENOL) 500 MG tablet Take 1,000 mg by mouth every 8 (eight) hours.    [provider]  atorvastatin (LIPITOR) 20 MG tablet Take 20 mg by mouth daily at 10 pm. 01/31/20 01/30/21  [provider]  Biotin 2.5 MG CAPS Take 2.5 mg by mouth daily.     [provider]  buPROPion (WELLBUTRIN XL) 150 MG 24 hr tablet Take 150 mg by mouth daily.    [provider]  diclofenac Sodium (VOLTAREN) 1 % GEL Apply 2 g topically 4 (four) times daily. 01/31/20   [provider]  furosemide (LASIX) 40 MG tablet Take 40 mg by mouth daily. 01/27/20   [provider]  gabapentin (NEURONTIN) 100 MG capsule Take 2 capsules (200 mg total) by mouth 3 (three) times daily. 03/01/20 03/31/20  Loletha Grayer, MD  lidocaine (LIDODERM) 5 % Place 1 patch onto the skin See admin instructions. Apply 1 patch on the skin for 12 hours each day and then remove. 01/26/20   [provider]  methocarbamol (ROBAXIN) 500 MG tablet Take 1 tablet (500 mg total) by mouth every 8 (eight) hours as needed for muscle spasms.  03/01/20   Loletha Grayer, MD  Multiple Vitamin (MULTIVITAMIN) tablet Take 1 tablet by mouth daily.    [provider]  nystatin (MYCOSTATIN/NYSTOP) powder Apply topically 3 (three) times daily. 03/01/20   Loletha Grayer, MD  OZEMPIC, 0.25 OR 0.5 MG/DOSE, 2 MG/1.5ML SOPN Inject 0.375 mLs into the skin every Saturday.  02/04/20   [provider]  potassium chloride SA (KLOR-CON) 20 MEQ tablet Take 1 tablet (20 mEq total) by mouth daily. 02/27/20 03/28/20  Kayleen Memos, DO  predniSONE (DELTASONE) 10 MG tablet 3 tabs po for three days; 2 tabs po for three days; 1 tab po for three days; 1/2 tab po for four days 03/01/20   Loletha Grayer, MD  rOPINIRole (REQUIP) 3 MG tablet Take 3 mg by mouth 3 (three) times daily. 01/20/18   [provider]  SYNTHROID 125 MCG tablet Take 250 mcg by mouth every morning. 01/30/20   [provider]    Physical Exam: Vitals:   04/09/20 1620 04/09/20 1630 04/09/20 1715 04/09/20 1827  BP: 128/76 134/79 110/62 (!) 113/59  Pulse:   (!) 56 85  Resp: (!) 22 16 20 20   Temp:    98.4 F (36.9 C)  TempSrc:    Axillary  SpO2:  100% 95% 100%  Weight:      Height:       General: Not in acute distress HEENT:       Eyes: PERRL, EOMI, no scleral icterus.       ENT: No discharge from the ears and nose, no pharynx injection, no tonsillar enlargement.        Neck: No JVD, no bruit, no mass felt. Heme: No neck lymph node enlargement. Cardiac: S1/S2, RRR, No murmurs, No gallops or rubs. Respiratory: Has crackles bilaterally GI: Soft, nondistended, nontender, no rebound pain, no organomegaly, BS present. GU: No hematuria Ext: 2+ pitting leg edema bilaterally. 1+DP/PT pulse bilaterally. Musculoskeletal: No joint deformities, No joint redness or warmth, no limitation of ROM in spin. Skin: No rashes.  Neuro: Alert, oriented X3, cranial nerves II-XII grossly intact, moves all extremities normally.  Psych: Patient is not psychotic, no suicidal  or hemocidal ideation.  Labs on Admission: I have personally reviewed following labs and imaging studies  CBC: Recent Labs  Lab 04/09/20 1303  WBC 11.8*  NEUTROABS 8.3*  HGB 12.2  HCT 41.3  MCV 97.2  PLT 062*   Basic Metabolic Panel: Recent Labs  Lab 04/09/20 1303  NA 137  K 4.7  CL 98  CO2 28  GLUCOSE 100*  BUN 63*  CREATININE 2.23*  CALCIUM 8.4*   GFR: Estimated Creatinine Clearance: 36.7 mL/min (A) (by C-G formula based on SCr of 2.23 mg/dL (H)). Liver Function Tests: No results for input(s): AST, ALT, ALKPHOS, BILITOT, PROT, ALBUMIN in the last 168 hours. No results for input(s): LIPASE, AMYLASE in the last 168 hours. No results for input(s): AMMONIA in the last 168 hours. Coagulation Profile: No results for input(s): INR, PROTIME in the last  168 hours. Cardiac Enzymes: No results for input(s): CKTOTAL, CKMB, CKMBINDEX, TROPONINI in the last 168 hours. BNP (last 3 results) No results for input(s): PROBNP in the last 8760 hours. HbA1C: No results for input(s): HGBA1C in the last 72 hours. CBG: Recent Labs  Lab 04/09/20 1646 04/09/20 1722  GLUCAP 66* 92   Lipid Profile: No results for input(s): CHOL, HDL, LDLCALC, TRIG, CHOLHDL, LDLDIRECT in the last 72 hours. Thyroid Function Tests: Recent Labs    04/09/20 1303  TSH 0.738   Anemia Panel: No results for input(s): VITAMINB12, FOLATE, FERRITIN, TIBC, IRON, RETICCTPCT in the last 72 hours. Urine analysis:    Component Value Date/Time   COLORURINE YELLOW (A) 02/21/2020 0342   APPEARANCEUR HAZY (A) 02/21/2020 0342   APPEARANCEUR CLOUDY 01/15/2014 0209   LABSPEC 1.010 02/21/2020 0342   LABSPEC 1.014 01/15/2014 0209   PHURINE 6.0 02/21/2020 0342   GLUCOSEU NEGATIVE 02/21/2020 0342   GLUCOSEU NEGATIVE 01/15/2014 0209   HGBUR NEGATIVE 02/21/2020 0342   BILIRUBINUR NEGATIVE 02/21/2020 0342   BILIRUBINUR NEGATIVE 01/15/2014 0209   KETONESUR NEGATIVE 02/21/2020 0342   PROTEINUR NEGATIVE 02/21/2020 0342     NITRITE NEGATIVE 02/21/2020 0342   LEUKOCYTESUR NEGATIVE 02/21/2020 0342   LEUKOCYTESUR 2+ 01/15/2014 0209   Sepsis Labs: @LABRCNTIP (procalcitonin:4,lacticidven:4) ) Recent Results (from the past 240 hour(s))  Respiratory Panel by RT PCR (Flu A&B, Covid) - Nasopharyngeal Swab     Status: None   Collection Time: 04/09/20  1:17 PM   Specimen: Nasopharyngeal Swab  Result Value Ref Range Status   SARS Coronavirus 2 by RT PCR NEGATIVE NEGATIVE Final    Comment: (NOTE) SARS-CoV-2 target nucleic acids are NOT DETECTED.  The SARS-CoV-2 RNA is generally detectable in upper respiratoy specimens during the acute phase of infection. The lowest concentration of SARS-CoV-2 viral copies this assay can detect is 131 copies/mL. A negative result does not preclude SARS-Cov-2 infection and should not be used as the sole basis for treatment or other patient management decisions. A negative result may occur with  improper specimen collection/handling, submission of specimen other than nasopharyngeal swab, presence of viral mutation(s) within the areas targeted by this assay, and inadequate number of viral copies (<131 copies/mL). A negative result must be combined with clinical observations, patient history, and epidemiological information. The expected result is Negative.  Fact Sheet for Patients:  PinkCheek.be  Fact Sheet for Healthcare Providers:  GravelBags.it  This test is no t yet approved or cleared by the [place] FDA and  has been authorized for detection and/or diagnosis of SARS-CoV-2 by FDA under an Emergency Use Authorization (EUA). This EUA will remain  in effect (meaning this test can be used) for the duration of the COVID-19 declaration under Section 564(b)(1) of the Act, 21 U.S.C. section 360bbb-3(b)(1), unless the authorization is terminated or revoked sooner.     Influenza A by PCR NEGATIVE NEGATIVE Final    Influenza B by PCR NEGATIVE NEGATIVE Final    Comment: (NOTE) The Xpert Xpress SARS-CoV-2/FLU/RSV assay is intended as an aid in  the diagnosis of influenza from Nasopharyngeal swab specimens and  should not be used as a sole basis for treatment. Nasal washings and  aspirates are unacceptable for Xpert Xpress SARS-CoV-2/FLU/RSV  testing.  Fact Sheet for Patients: PinkCheek.be  Fact Sheet for Healthcare Providers: GravelBags.it  This test is not yet approved or cleared by the [place] FDA and  has been authorized for detection and/or diagnosis of SARS-CoV-2 by  FDA under an Emergency Use  Authorization (EUA). This EUA will remain  in effect (meaning this test can be used) for the duration of the  Covid-19 declaration under Section 564(b)(1) of the Act, 21  U.S.C. section 360bbb-3(b)(1), unless the authorization is  terminated or revoked. Performed at [facility], [address], [place], [place] [address]      Radiological Exams on Admission: DG Chest Portable 1 View  Result Date: 04/09/2020 CLINICAL DATA:  Shortness of breath EXAM: PORTABLE CHEST 1 VIEW COMPARISON:  February 21, 2020 FINDINGS: There is cardiomegaly with pulmonary venous hypertension. There is mild interstitial edema. No consolidation. No adenopathy. No bone lesions. IMPRESSION: Cardiomegaly with pulmonary vascular congestion. Mild interstitial edema. Overall appearance is felt to be indicative of congestive heart failure. No consolidation. Electronically Signed   By: Lowella Grip III M.D.   On: 04/09/2020 13:59     EKG: Independently reviewed.  Sinus rhythm, QTC 505, low voltage, bradycardia, poor R wave progression, LAD  Assessment/Plan Principal Problem:   Acute on chronic diastolic CHF (congestive heart failure) (HCC) Active Problems:   Hypothyroidism   Acute respiratory failure with hypoxia and hypercapnia (HCC)   Acute renal  failure superimposed on stage 3a chronic kidney disease (HCC)   Diabetes mellitus without complication (HCC)   HLD (hyperlipidemia)   Depression   Elevated troponin   Acute respiratory failure with hypoxia and hypercapnia due to acute on chronic diastolic CHF: 2D echo on 02/17/8109 showed EF of 50-55%.  Patient has elevated BNP 941, 2+ bilateral leg edema, and interstitial edema on chest x-ray, clinically consistent with CHF exacerbation.  Cardiology, Dr. Nehemiah Massed is consulted.  -Will admit to progressive unit as inpatient -Lasix 40 mg bid by IV -2d echo -Daily weights -strict I/O's -Low salt diet -Fluid restriction  Elevated troponin: Troponin 24.  No chest pain.  Most likely due to demand ischemia. -Trend troponin -Continue Lipitor -Aspirin -Check UDS, A1c, FLP -Follow-up 2D echo  Hypothyroidism -Synthroid  Acute renal failure superimposed on stage 3a chronic kidney disease ([place]): Baseline creatinine 1.0-1.1 recently.  Creatinine 2.23, BUN 63, likely due to cardiorenal syndrome. -Hold Diovan -Monitor renal function closely edema of  Diabetes mellitus without complication [facility]): Recent A1c 6.5, well controlled.  Patient is taking Ozempic at home -Sliding scale insulin  HLD (hyperlipidemia) -Lipitor  Depression -Continue home medications.     DVT ppx: SQ Lovenox Code Status: Full code Family Communication:   Yes, patient's husband at bed side Disposition Plan:  Anticipate discharge back to previous environment Consults called:  Dr. Nehemiah Massed of cardiology Admission status: progressive unit as inpt     Patient is a multiple comorbidities, now presents with acute on chronic diastolic CHF and acute respiratory failure with hypoxia and hypercapnia. Needs BiPAP. Patient also has elevated troponin, worsening renal function.  Her presentation is highly complicated.  Patient is at high risk of deteriorating.  Need to be treated in hospital for at least 2 days.    Date of  Service 04/09/2020    Ivor Costa [facility]   If 7PM-7AM, please contact night-coverage [email or web address] 04/09/2020, 6:42 PM

## 2020-04-09 NOTE — ED Provider Notes (Signed)
[facility] Emergency Department Provider Note   ____________________________________________   First MD Initiated Contact with Patient 04/09/20 1256     (approximate)  I have reviewed the triage vital signs and the nursing notes.   HISTORY  Chief Complaint Shortness of Breath    HPI Madison Mcbride is a 64 y.o. female with past medical history of diabetes, hypothyroidism, and OSA who presents to the ED complaining of shortness of breath.  Patient reports that she has had increasing difficulty breathing over the past 2 to 3 days along with increasing swelling in both of her legs.  She endorses a nonproductive cough and will occasionally have pain in her chest when she goes to cough, but she currently denies any chest pain.  She denies any recent fevers and has not had any sick contacts, denies vomiting or diarrhea.  On EMS arrival, patient was found to have O2 sats in the 70s on room air, but this improved with application of 4 L nasal cannula.  Patient currently wears 2 L nasal cannula at night along with her CPAP for OSA.  She states her breathing has improved with supplemental oxygen.        Past Medical History:  Diagnosis Date   Diabetes mellitus without complication (HCC)    Hypothyroidism    RLS (restless legs syndrome)    Thyroid cancer ([place]) 2007    Patient Active Problem List   Diagnosis Date Noted   Lumbar radiculopathy, acute    Acute midline low back pain without sciatica    Type 2 diabetes mellitus with diabetic neuropathy, without long-term current use of insulin ([place]) 02/21/2020   Hypothyroidism 02/21/2020   Morbid obesity with BMI of 50.0-59.9, adult ([place]) 02/21/2020   Severe sepsis ([place]) 02/21/2020   CAP (community acquired pneumonia) 02/21/2020   Bilateral cellulitis of lower leg 02/21/2020   Acute respiratory failure with hypoxia ([place]) 02/21/2020   AKI (acute kidney injury) ([place]) 81/19/1478   Acute  metabolic encephalopathy 29/56/2130   Septic shock ([place]) 02/21/2020    Past Surgical History:  Procedure Laterality Date   ABDOMINAL HYSTERECTOMY     COLONOSCOPY WITH PROPOFOL N/A 08/26/2015   Procedure: COLONOSCOPY WITH PROPOFOL;  Surgeon: Manya Silvas, MD;  Location: [facility];  Service: Endoscopy;  Laterality: N/A;   THYROIDECTOMY      Prior to Admission medications   Medication Sig Start Date End Date Taking? Authorizing Provider  acetaminophen (TYLENOL) 500 MG tablet Take 1,000 mg by mouth every 8 (eight) hours.    [provider]  atorvastatin (LIPITOR) 20 MG tablet Take 20 mg by mouth daily at 10 pm. 01/31/20 01/30/21  [provider]  Biotin 2.5 MG CAPS Take 2.5 mg by mouth daily.     [provider]  buPROPion (WELLBUTRIN XL) 150 MG 24 hr tablet Take 150 mg by mouth daily.    [provider]  diclofenac Sodium (VOLTAREN) 1 % GEL Apply 2 g topically 4 (four) times daily. 01/31/20   [provider]  furosemide (LASIX) 40 MG tablet Take 40 mg by mouth daily. 01/27/20   [provider]  gabapentin (NEURONTIN) 100 MG capsule Take 2 capsules (200 mg total) by mouth 3 (three) times daily. 03/01/20 03/31/20  Loletha Grayer, MD  lidocaine (LIDODERM) 5 % Place 1 patch onto the skin See admin instructions. Apply 1 patch on the skin for 12 hours each day and then remove. 01/26/20   [provider]  methocarbamol (ROBAXIN) 500  MG tablet Take 1 tablet (500 mg total) by mouth every 8 (eight) hours as needed for muscle spasms. 03/01/20   Loletha Grayer, MD  Multiple Vitamin (MULTIVITAMIN) tablet Take 1 tablet by mouth daily.    [provider]  nystatin (MYCOSTATIN/NYSTOP) powder Apply topically 3 (three) times daily. 03/01/20   Loletha Grayer, MD  OZEMPIC, 0.25 OR 0.5 MG/DOSE, 2 MG/1.5ML SOPN Inject 0.375 mLs into the skin every Saturday.  02/04/20   [provider]  potassium chloride SA (KLOR-CON) 20 MEQ  tablet Take 1 tablet (20 mEq total) by mouth daily. 02/27/20 03/28/20  Kayleen Memos, DO  predniSONE (DELTASONE) 10 MG tablet 3 tabs po for three days; 2 tabs po for three days; 1 tab po for three days; 1/2 tab po for four days 03/01/20   Loletha Grayer, MD  rOPINIRole (REQUIP) 3 MG tablet Take 3 mg by mouth 3 (three) times daily. 01/20/18   [provider]  SYNTHROID 125 MCG tablet Take 250 mcg by mouth every morning. 01/30/20   [provider]    Allergies Patient has no known allergies.  Family History  Problem Relation Age of Onset   Breast cancer Neg Hx     Social History Social History   Tobacco Use   Smoking status: Never Smoker   Smokeless tobacco: Never Used  Substance Use Topics   Alcohol use: No   Drug use: No    Review of Systems  Constitutional: No fever/chills Eyes: No visual changes. ENT: No sore throat. Cardiovascular: Positive for chest pain. Respiratory: Positive for cough and shortness of breath. Gastrointestinal: No abdominal pain.  No nausea, no vomiting.  No diarrhea.  No constipation. Genitourinary: Negative for dysuria. Musculoskeletal: Negative for back pain.  Positive for leg swelling. Skin: Negative for rash. Neurological: Negative for headaches, focal weakness or numbness.  ____________________________________________   PHYSICAL EXAM:  VITAL SIGNS: ED Triage Vitals  Enc Vitals Group     BP      Pulse      Resp      Temp      Temp src      SpO2      Weight      Height      Head Circumference      Peak Flow      Pain Score      Pain Loc      Pain Edu?      Excl. in [place]?     Constitutional: Alert and oriented. Eyes: Conjunctivae are normal. Head: Atraumatic. Nose: No congestion/rhinnorhea. Mouth/Throat: Mucous membranes are moist. Neck: Normal ROM Cardiovascular: Normal rate, regular rhythm. Grossly normal heart sounds. Respiratory: Mild tachypnea with increased respiratory effort.  No retractions. Lungs  with crackles to bilateral bases, no wheezing noted. Gastrointestinal: Soft and nontender. No distention. Genitourinary: deferred Musculoskeletal: No lower extremity tenderness, 2+ pitting edema into thighs bilaterally. Neurologic:  Normal speech and language. No gross focal neurologic deficits are appreciated. Skin:  Skin is warm, dry and intact. No rash noted. Psychiatric: Mood and affect are normal. Speech and behavior are normal.  ____________________________________________   LABS (all labs ordered are listed, but only abnormal results are displayed)  Labs Reviewed  CBC WITH DIFFERENTIAL/PLATELET - Abnormal; Notable for the following components:      Result Value   WBC 11.8 (*)    MCHC 29.5 (*)    RDW 22.4 (*)    Platelets 445 (*)    nRBC 2.7 (*)  Neutro Abs 8.3 (*)    Abs Immature Granulocytes 0.08 (*)    All other components within normal limits  BASIC METABOLIC PANEL - Abnormal; Notable for the following components:   Glucose, Bld 100 (*)    BUN 63 (*)    Creatinine, Ser 2.23 (*)    Calcium 8.4 (*)    GFR calc non Af Amer 22 (*)    GFR calc Af Amer 26 (*)    All other components within normal limits  BRAIN NATRIURETIC PEPTIDE - Abnormal; Notable for the following components:   B Natriuretic Peptide 941.8 (*)    All other components within normal limits  BLOOD GAS, VENOUS - Abnormal; Notable for the following components:   pCO2, Ven 75 (*)    pO2, Ven 46.0 (*)    Bicarbonate 35.2 (*)    Acid-Base Excess 6.0 (*)    All other components within normal limits  TROPONIN I (HIGH SENSITIVITY) - Abnormal; Notable for the following components:   Troponin I (High Sensitivity) 24 (*)    All other components within normal limits  RESPIRATORY PANEL BY RT PCR (FLU A&B, COVID)  TSH  TROPONIN I (HIGH SENSITIVITY)   ____________________________________________  EKG  ED ECG REPORT I, Blake Divine, the attending physician, personally viewed and interpreted this ECG.    Date: 04/09/2020  EKG Time: 13:06  Rate: 54  Rhythm: sinus bradycardia  Axis: Normal  Intervals:first-degree A-V block  and prolonged QT  ST&T Change: None   PROCEDURES  Procedure(s) performed (including Critical Care):  .Critical Care Performed by: Blake Divine, MD Authorized by: Blake Divine, MD   Critical care provider statement:    Critical care time (minutes):  45   Critical care time was exclusive of:  Separately billable procedures and treating other patients and teaching time   Critical care was necessary to treat or prevent imminent or life-threatening deterioration of the following conditions:  Respiratory failure   Critical care was time spent personally by me on the following activities:  Discussions with consultants, evaluation of patient's response to treatment, examination of patient, ordering and performing treatments and interventions, ordering and review of laboratory studies, ordering and review of radiographic studies, pulse oximetry, re-evaluation of patient's condition, obtaining history from patient or surrogate and review of old charts   I assumed direction of critical care for this patient from another provider in my specialty: no       ____________________________________________   INITIAL IMPRESSION / ASSESSMENT AND PLAN / ED COURSE       64 year old female with past medical history of diabetes, hypothyroidism, and OSA on 2 L nasal cannula at night who presents to the ED with increasing shortness of breath over the past 2 to 3 days with associated leg swelling.  She was noted to have low O2 sats with EMS, however this quickly improved with application of 4 L nasal cannula and while she has some increased work of breathing on arrival, she is not in any respiratory distress.  She appears clinically fluid overloaded and I would be suspicious for new onset CHF.  We will further assess with chest x-ray, EKG, and labs.  Chest x-ray shows cardiomegaly  with pulmonary edema consistent with CHF.  VBG is also concerning for hypercapnia with mild respiratory acidosis.  Patient was subsequently placed on BiPAP, remains awake and alert.  We will also treat with IV Lasix and Nitropaste.  Troponin is within normal limits and there is no evidence of ACS at this  time.  She does have a significant AKI and we will need to be careful with diuresis.  Case discussed with hospitalist for admission.      ____________________________________________   FINAL CLINICAL IMPRESSION(S) / ED DIAGNOSES  Final diagnoses:  New onset of congestive heart failure ([place])  Acute respiratory failure with hypoxia and hypercapnia [facility])     ED Discharge Orders    None       Note:  This document was prepared using Dragon voice recognition software and may include unintentional dictation errors.   Blake Divine, MD 04/09/20 1440

## 2020-04-09 NOTE — Consult Note (Signed)
[facility] Cardiology Consultation Note  Patient ID: Madison Mcbride, MRN: [id number], DOB/AGE: [birth year] 64 y.o. Admit date: 04/09/2020   Date of Consult: 04/09/2020 Primary Physician: Rusty Aus, MD Primary Cardiologist: None  Chief Complaint:  Chief Complaint  Patient presents with  . Shortness of Breath   Reason for Consult: Heart failure  HPI: 64 y.o. female with known diabetes with multiple complications chronic kidney disease stage IV hypertension hyperlipidemia with acute on chronic diastolic dysfunction congestive heart failure.  The patient has had recent hospitalization with pulmonary infection as well as diastolic dysfunction congestive heart failure for which she had intravenous Lasix and other treatments.  The patient slowly improved with some issues but doesn't go to her rehab.  At that time the patient had an exacerbation with worsening pulmonary edema and a BNP of 941 with a troponin of 24 and a chest x-ray showing continued vascular congestion.  The patient's oxygenation level was low and she was placed on BiPAP which has improved her symptoms.  She does have an EKG showing bradycardia with left axis deviation and poor R wave progression and a previous echocardiogram showing normal LV systolic function and no evidence of significant valvular heart disease.  Patient does feel slightly better with BiPAP at this time and has no evidence of chest pain or myocardial infarction.  Past Medical History:  Diagnosis Date  . Diabetes mellitus without complication ([place])   . Hypothyroidism   . RLS (restless legs syndrome)   . Thyroid cancer [facility]) 2007      Surgical History:  Past Surgical History:  Procedure Laterality Date  . ABDOMINAL HYSTERECTOMY    . COLONOSCOPY WITH PROPOFOL N/A 08/26/2015   Procedure: COLONOSCOPY WITH PROPOFOL;  Surgeon: Manya Silvas, MD;  Location: [facility] ENDOSCOPY;  Service: Endoscopy;  Laterality: N/A;  . THYROIDECTOMY       Home  Meds: Prior to Admission medications   Medication Sig Start Date End Date Taking? Authorizing Provider  atorvastatin (LIPITOR) 20 MG tablet Take 20 mg by mouth daily at 10 pm. 01/31/20 01/30/21 Yes [provider]  Biotin 2.5 MG CAPS Take 2.5 mg by mouth daily.    Yes [provider]  buPROPion (WELLBUTRIN SR) 150 MG 12 hr tablet Take 150 mg by mouth daily. 03/22/20  Yes [provider]  Cholecalciferol (D3-1000) 25 MCG (1000 UT) tablet Take 1,000 Units by mouth daily.   Yes [provider]  furosemide (LASIX) 40 MG tablet Take 40 mg by mouth daily. 01/27/20  Yes [provider]  gabapentin (NEURONTIN) 100 MG capsule Take 2 capsules (200 mg total) by mouth 3 (three) times daily. Patient taking differently: Take 300 mg by mouth 3 (three) times daily.  03/01/20 04/09/20 Yes Wieting, Richard, MD  Multiple Vitamin (MULTIVITAMIN) tablet Take 1 tablet by mouth daily.   Yes [provider]  propranolol (INDERAL) 20 MG tablet Take 20 mg by mouth 3 (three) times daily.   Yes [provider]  rOPINIRole (REQUIP) 3 MG tablet Take 3 mg by mouth 3 (three) times daily. 01/20/18  Yes [provider]  SYNTHROID 125 MCG tablet Take 250 mcg by mouth every morning. 01/30/20  Yes [provider]  valsartan (DIOVAN) 160 MG tablet Take 160 mg by mouth daily. 04/07/20  Yes [provider]  zinc gluconate 50 MG tablet Take 50 mg by mouth daily.   Yes [provider]  acetaminophen (TYLENOL) 500 MG tablet Take 1,000 mg by mouth every 8 (  eight) hours.    [provider]  methocarbamol (ROBAXIN) 500 MG tablet Take 1 tablet (500 mg total) by mouth every 8 (eight) hours as needed for muscle spasms. Patient not taking: Reported on 04/09/2020 03/01/20   Loletha Grayer, MD  metolazone (ZAROXOLYN) 5 MG tablet Take 1 tablet by mouth daily as needed.  02/28/20   [provider]  nystatin (MYCOSTATIN/NYSTOP) powder Apply  topically 3 (three) times daily. Patient not taking: Reported on 04/09/2020 03/01/20   Loletha Grayer, MD  OZEMPIC, 0.25 OR 0.5 MG/DOSE, 2 MG/1.5ML SOPN Inject 0.375 mLs into the skin every Saturday.  02/04/20   [provider]  potassium chloride SA (KLOR-CON) 20 MEQ tablet Take 1 tablet (20 mEq total) by mouth daily. Patient not taking: Reported on 04/09/2020 02/27/20 03/28/20  Kayleen Memos, DO  predniSONE (DELTASONE) 10 MG tablet 3 tabs po for three days; 2 tabs po for three days; 1 tab po for three days; 1/2 tab po for four days Patient not taking: Reported on 04/09/2020 03/01/20   Loletha Grayer, MD    Inpatient Medications:  . insulin aspart  0-5 Units Subcutaneous QHS  . insulin aspart  0-9 Units Subcutaneous TID WC     Allergies: No Known Allergies  Social History   Socioeconomic History  . Marital status: Married    Spouse name: Not on file  . Number of children: Not on file  . Years of education: Not on file  . Highest education level: Not on file  Occupational History  . Not on file  Tobacco Use  . Smoking status: Never Smoker  . Smokeless tobacco: Never Used  Substance and Sexual Activity  . Alcohol use: No  . Drug use: No  . Sexual activity: Not on file  Other Topics Concern  . Not on file  Social History Narrative  . Not on file   Social Determinants of Health   Financial Resource Strain:   . Difficulty of Paying Living Expenses: Not on file  Food Insecurity:   . Worried About Charity fundraiser in the Last Year: Not on file  . Ran Out of Food in the Last Year: Not on file  Transportation Needs:   . Lack of Transportation (Medical): Not on file  . Lack of Transportation (Non-Medical): Not on file  Physical Activity:   . Days of Exercise per Week: Not on file  . Minutes of Exercise per Session: Not on file  Stress:   . Feeling of Stress : Not on file  Social Connections:   . Frequency of Communication with Friends and Family: Not on file  .  Frequency of Social Gatherings with Friends and Family: Not on file  . Attends Religious Services: Not on file  . Active Member of Clubs or Organizations: Not on file  . Attends Archivist Meetings: Not on file  . Marital Status: Not on file  Intimate Partner Violence:   . Fear of Current or Ex-Partner: Not on file  . Emotionally Abused: Not on file  . Physically Abused: Not on file  . Sexually Abused: Not on file     Family History  Problem Relation Age of Onset  . Breast cancer Neg Hx      Review of Systems Positive for shortness of breath PND orthopnea Negative for: General:  chills, fever, night sweats or weight changes.  Cardiovascular: Positive for PND orthopnea negative for syncope dizziness  Dermatological skin lesions rashes Respiratory: Cough congestion Urologic: Frequent  urination urination at night and hematuria Abdominal: negative for nausea, vomiting, diarrhea, bright red blood per rectum, melena, or hematemesis Neurologic: negative for visual changes, and/or hearing changes  All other systems reviewed and are otherwise negative except as noted above.  Labs: No results for input(s): CKTOTAL, CKMB, TROPONINI in the last 72 hours. Lab Results  Component Value Date   WBC 11.8 (H) 04/09/2020   HGB 12.2 04/09/2020   HCT 41.3 04/09/2020   MCV 97.2 04/09/2020   PLT 445 (H) 04/09/2020    Recent Labs  Lab 04/09/20 1303  NA 137  K 4.7  CL 98  CO2 28  BUN 63*  CREATININE 2.23*  CALCIUM 8.4*  GLUCOSE 100*   No results found for: CHOL, HDL, LDLCALC, TRIG No results found for: DDIMER  Radiology/Studies:  DG Chest Portable 1 View  Result Date: 04/09/2020 CLINICAL DATA:  Shortness of breath EXAM: PORTABLE CHEST 1 VIEW COMPARISON:  February 21, 2020 FINDINGS: There is cardiomegaly with pulmonary venous hypertension. There is mild interstitial edema. No consolidation. No adenopathy. No bone lesions. IMPRESSION: Cardiomegaly with pulmonary vascular  congestion. Mild interstitial edema. Overall appearance is felt to be indicative of congestive heart failure. No consolidation. Electronically Signed   By: Lowella Grip III M.D.   On: 04/09/2020 13:59    EKG: Sinus bradycardia left axis deviation poor R wave progression  Weights: Filed Weights   04/09/20 1300  Weight: (!) 148.8 kg     Physical Exam: Blood pressure 134/79, pulse (!) 54, temperature 98.3 F (36.8 C), resp. rate 16, height 5\' 5"  (1.651 m), weight (!) 148.8 kg, SpO2 100 %. Body mass index is 54.58 kg/m. General: Well developed, well nourished, in no acute distress. Head eyes ears nose throat: Normocephalic, atraumatic, sclera non-icteric, no xanthomas, nares are without discharge. No apparent thyromegaly and/or mass  Lungs: Normal respiratory effort.  Some wheezes, basilar rales, no rhonchi.  Heart: RRR with normal S1 S2. no murmur gallop, no rub, PMI is normal size and placement, carotid upstroke normal without bruit, jugular venous pressure is normal Abdomen: Soft, non-tender,  distended with normoactive bowel sounds. No hepatomegaly. No rebound/guarding. No obvious abdominal masses. Abdominal aorta is normal size without bruit Extremities: 1-2+ edema. no cyanosis, no clubbing, positive ulcers  Peripheral : 2+ bilateral upper extremity pulses, 2+ bilateral femoral pulses, 2+ bilateral dorsal pedal pulse Neuro: Alert and oriented. No facial asymmetry. No focal deficit. Moves all extremities spontaneously. Musculoskeletal: Normal muscle tone without kyphosis Psych:  Responds to questions appropriately with a normal affect.    Assessment: 64 year old female with diabetes with complications hypertension hyperlipidemia chronic kidney disease stage IV with acute on chronic diastolic dysfunction congestive heart failure without myocardial infarction  Plan: 1.  Reinstatement of Lasix intravenously for acute on chronic diastolic dysfunction congestive heart failure with  acute on chronic kidney disease.  If patient has poor urine output will consider Lasix drip tomorrow 2.  Continue BiPAP for oxygenation and hypoxia from diastolic dysfunction or failure 3.  No further cardiac diagnostics necessary at this time 4.  Further investigation of other causes and treatment options after above  Signed, Corey Skains M.D. [facility] 04/09/2020, 5:10 PM

## 2020-04-09 NOTE — Progress Notes (Signed)
   04/09/20 1827  Vitals  Temp 98.4 F (36.9 C)  Temp Source Axillary  BP (!) 113/59  MAP (mmHg) 71  BP Location Left Arm  BP Method Automatic  Patient Position (if appropriate) Lying  Pulse Rate 85  Pulse Rate Source Monitor  Resp 20   Patient arrived to room 257. Pt A&O. Pt has old wounds from a boil(Per pt).

## 2020-04-09 NOTE — ED Notes (Signed)
Pt CBG 64. 12.5 g d50 administered per standing protocol. Admit MD notified.

## 2020-04-10 DIAGNOSIS — I5033 Acute on chronic diastolic (congestive) heart failure: Secondary | ICD-10-CM

## 2020-04-10 LAB — GLUCOSE, CAPILLARY
Glucose-Capillary: 75 mg/dL (ref 70–99)
Glucose-Capillary: 74 mg/dL (ref 70–99)
Glucose-Capillary: 85 mg/dL (ref 70–99)
Glucose-Capillary: 82 mg/dL (ref 70–99)
Glucose-Capillary: 104 mg/dL — ABNORMAL HIGH (ref 70–99)

## 2020-04-10 LAB — CBC
HCT: 36 % (ref 36.0–46.0)
Hemoglobin: 10.8 g/dL — ABNORMAL LOW (ref 12.0–15.0)
MCH: 28.6 pg (ref 26.0–34.0)
MCHC: 30 g/dL (ref 30.0–36.0)
MCV: 95.2 fL (ref 80.0–100.0)
Platelets: 373 10*3/uL (ref 150–400)
RBC: 3.78 MIL/uL — ABNORMAL LOW (ref 3.87–5.11)
RDW: 21.9 % — ABNORMAL HIGH (ref 11.5–15.5)
WBC: 12.6 10*3/uL — ABNORMAL HIGH (ref 4.0–10.5)
nRBC: 0.9 % — ABNORMAL HIGH (ref 0.0–0.2)

## 2020-04-10 LAB — LIPID PANEL
Cholesterol: 91 mg/dL (ref 0–200)
HDL: 13 mg/dL — ABNORMAL LOW (ref >40)
LDL Cholesterol: 56 mg/dL (ref 0–99)
Total CHOL/HDL Ratio: 7 RATIO
Triglycerides: 108 mg/dL (ref <150)
VLDL: 22 mg/dL (ref 0–40)

## 2020-04-10 LAB — BASIC METABOLIC PANEL
Anion gap: 11 (ref 5–15)
BUN: 63 mg/dL — ABNORMAL HIGH (ref 8–23)
CO2: 32 mmol/L (ref 22–32)
Calcium: 8.3 mg/dL — ABNORMAL LOW (ref 8.9–10.3)
Chloride: 97 mmol/L — ABNORMAL LOW (ref 98–111)
Creatinine, Ser: 2.38 mg/dL — ABNORMAL HIGH (ref 0.44–1.00)
GFR calc Af Amer: 24 mL/min — ABNORMAL LOW (ref >60)
GFR calc non Af Amer: 21 mL/min — ABNORMAL LOW (ref >60)
Glucose, Bld: 80 mg/dL (ref 70–99)
Potassium: 5 mmol/L (ref 3.5–5.1)
Sodium: 140 mmol/L (ref 135–145)

## 2020-04-10 LAB — MAGNESIUM: Magnesium: 2.4 mg/dL (ref 1.7–2.4)

## 2020-04-10 NOTE — Progress Notes (Addendum)
  Heart Failure Nurse Navigator Note  HfpEF- 50-55%by echocardiogram performed 02/2020.    Patient presented from home with complaints of worsening SOB for 3 days.[name]  02 saturations of 65% on room air. Also noted dry cough.  Comorbidities:  OSA Morbid Obesity CKD stage 3 Diabetes Hypertension Hyperlipidemia   Medications:  ASA 81 mg daily Lipitor 20 mg daily Lasix-currently on hold Inderal-currently on hold  Labs:  Sodium 140, potassium 5.0, BUN 63, creatinine 2.38(yesterday 2.23)  On admission BNP 941 and troponin 24.  Weight- 152.9 (148.8)  Intake-360 ml Output-400 ml  Assessment:  General- she is awake and alert, husband at bedside. She states her breathing is not back to baseline. Wearing 02 at 3 liters [place].Blood pressure readings 88/-101/ she denies any dizziness or being light headed.  HEENT-normocephalic, pupils equal.  Neck-thick  Cardiac- heart tones are distant   Respiratory- breath sounds to anterior auscultation.  GI- abdomen obese, soft.  Musculoskeletal-lower extremities trace edema.  Neuro-speech is clear, moves all 4 extremities.  Psych- is pleasant and appropriate, makes eye contact.    Patient and husband were given heart failure teaching booklet, she states she had not seen that before.  Discussed with them the importance of removing salt/sodium from their diet.  Discussed foods to eat and ones to abstain from.  Also stressed the importance of daily weights and recording, what to report to doctor.  Also discussed the signs and symptoms to report.  Explained that I would be following her while her in the hospital.  If they thought of  Any questions to write them down and would discuss tomorrow.   Pricilla Riffle RN, CHFN

## 2020-04-10 NOTE — Progress Notes (Signed)
Patient's SBP has been in the 90s all night. Evening lasix held per hospitalist. HR in the lower 50s, can drop down to high 40s. Evening beta blocker also held. Nightly blood sugar was 71. Apple juice given with meds and rechecked midnight to make sure she didn't drop any lower. Midnight recheck was 75. Patient then had some more apple juice with some extra sugar packets added to help increase blood sugar while not having to give her excessive amounts of juice. Rechecked sugar again at 4 and it remained 75.  Morning EKG done shows SB with 1st degree and prolonged Qtc. Jonny Ruiz NP notified and updated in the beginning of shift and now. Patient resting in bed. Voicing no complaints at this time. Continues to wear bipap. Oxygen saturation 95-100%. Will desat into the 80s on room air.

## 2020-04-10 NOTE — Progress Notes (Signed)
[facility] Cardiology [facility] Encounter Note  Patient: Madison Mcbride / Admit Date: 04/09/2020 / Date of Encounter: 04/10/2020, 8:40 AM   Subjective: Patient overall feeling slightly better than yesterday.  Breathing better and able to sit up.  Patient's oxygenation appears to be slightly better.  The patient has had laboratories pending this a.m. and therefore do not know her current kidney status.  No evidence of myocardial infarction and or chest discomfort this morning.  The patient likely has acute on chronic diastolic dysfunction heart failure due to multiple factors including hypoxia sleep apnea morbid obesity and chronic kidney disease.  Review of Systems: Positive for: Shortness of breath Negative for: Vision change, hearing change, syncope, dizziness, nausea, vomiting,diarrhea, bloody stool, stomach pain, cough, congestion, diaphoresis, urinary frequency, urinary pain,skin lesions, skin rashes Others previously listed  Objective: Telemetry: Sinus bradycardia Physical Exam: Blood pressure 94/63, pulse (!) 56, temperature 98.9 F (37.2 C), temperature source Axillary, resp. rate 20, height 5\' 5"  (1.651 m), weight (!) 152.9 kg, SpO2 94 %. Body mass index is 56.09 kg/m. General: Well developed, well nourished, in no acute distress. Head: Normocephalic, atraumatic, sclera non-icteric, no xanthomas, nares are without discharge. Neck: No apparent masses Lungs: Normal respirations with no wheezes, few rhonchi, no rales , basilar crackles   Heart: Regular rate and rhythm, normal S1 S2, no murmur, no rub, no gallop, PMI is normal size and placement, carotid upstroke normal without bruit, jugular venous pressure normal Abdomen: Soft, non-tender, distended with normoactive bowel sounds. No hepatosplenomegaly. Abdominal aorta is normal size without bruit Extremities: Trace to 1+ edema, no clubbing, no cyanosis, no ulcers,  Peripheral: 2+ radial, 2+ femoral, 2+ dorsal pedal  pulses Neuro: Alert and oriented. Moves all extremities spontaneously. Psych:  Responds to questions appropriately with a normal affect.   Intake/Output Summary (Last 24 hours) at 04/10/2020 0840 Last data filed at 04/10/2020 0418 Gross per 24 hour  Intake 360 ml  Output 400 ml  Net -40 ml    Inpatient Medications:  . aspirin EC  81 mg Oral Daily  . atorvastatin  20 mg Oral Q2200  . buPROPion  150 mg Oral Daily  . cholecalciferol  1,000 Units Oral Daily  . enoxaparin (LOVENOX) injection  40 mg Subcutaneous Q12H  . furosemide  40 mg Intravenous BID  . gabapentin  300 mg Oral TID  . insulin aspart  0-5 Units Subcutaneous QHS  . insulin aspart  0-9 Units Subcutaneous TID WC  . levothyroxine  250 mcg Oral Q0600  . multivitamin with minerals  1 tablet Oral Daily  . propranolol  20 mg Oral TID  . rOPINIRole  3 mg Oral TID  . sodium chloride flush  3 mL Intravenous Q12H  . zinc sulfate  220 mg Oral Daily   Infusions:  . sodium chloride      Labs: Recent Labs    04/09/20 1303 04/10/20 0629  NA 137  --   K 4.7  --   CL 98  --   CO2 28  --   GLUCOSE 100*  --   BUN 63*  --   CREATININE 2.23*  --   CALCIUM 8.4*  --   MG  --  2.4   No results for input(s): AST, ALT, ALKPHOS, BILITOT, PROT, ALBUMIN in the last 72 hours. Recent Labs    04/09/20 1303 04/10/20 0629  WBC 11.8* 12.6*  NEUTROABS 8.3*  --   HGB 12.2 10.8*  HCT 41.3 36.0  MCV 97.2 95.2  PLT 445* 373   No results for input(s): CKTOTAL, CKMB, TROPONINI in the last 72 hours. Invalid input(s): POCBNP Recent Labs    04/09/20 1641  HGBA1C 5.8*     Weights: Filed Weights   04/09/20 1300 04/10/20 0400  Weight: (!) 148.8 kg (!) 152.9 kg     Radiology/Studies:  DG Chest Portable 1 View  Result Date: 04/09/2020 CLINICAL DATA:  Shortness of breath EXAM: PORTABLE CHEST 1 VIEW COMPARISON:  February 21, 2020 FINDINGS: There is cardiomegaly with pulmonary venous hypertension. There is mild interstitial edema. No  consolidation. No adenopathy. No bone lesions. IMPRESSION: Cardiomegaly with pulmonary vascular congestion. Mild interstitial edema. Overall appearance is felt to be indicative of congestive heart failure. No consolidation. Electronically Signed   By: Lowella Grip III M.D.   On: 04/09/2020 13:59     Assessment and Recommendation  64 y.o. female with acute on chronic diastolic dysfunction congestive heart failure multifactorial in nature including acute on chronic kidney dysfunction diabetes hypertension hyperlipidemia sleep apnea without evidence of myocardial infarction 1.  Continue BiPAP as able for improve oxygenation and improved acute on chronic diastolic dysfunction congestive heart failure 2.  Continue intravenous Lasix as long as patient does not have any significant worsening chronic kidney disease.  If worsened and continues to have concerns of pulmonary edema would potentially consider Lasix drip 3.  No further cardiac diagnostics necessary at this time 4.  Begin ambulation and follow-up for improvements of symptoms and need for adjustments  Signed, Serafina Royals M.D. FACC

## 2020-04-10 NOTE — Progress Notes (Signed)
Progress Note    Madison Mcbride  TDS:[id number] DOB: 1956/06/20  DOA: 04/09/2020 PCP: Rusty Aus, MD      Brief Narrative:    Medical records reviewed and are as summarized below:  Madison Mcbride is a 64 y.o. female       Assessment/Plan:   Principal Problem:   Acute on chronic diastolic CHF (congestive heart failure) ([place]) Active Problems:   Hypothyroidism   Acute respiratory failure with hypoxia and hypercapnia (HCC)   Acute renal failure superimposed on stage 3a chronic kidney disease ([place])   Diabetes mellitus without complication (HCC)   HLD (hyperlipidemia)   Depression   Elevated troponin   OSA (obstructive sleep apnea)  Hypotension  Body mass index is 56.09 kg/m.  (Morbid obesity)   PLAN  Her blood pressure has been running low which limits the use of high-dose IV Lasix. Continue IV Lasix as BP allows. Hold propranolol because of hypotension Humalog as needed for hyperglycemia. Creatinine is trending up.  Monitor BMP closely. Follow-up with cardiologist for further recommendations.   Diet Order            Diet 2 gram sodium Room service appropriate? Yes; Fluid consistency: Thin  Diet effective now                    Consultants:  Cardiologist  Procedures:  None    Medications:   . aspirin EC  81 mg Oral Daily  . atorvastatin  20 mg Oral Q2200  . buPROPion  150 mg Oral Daily  . cholecalciferol  1,000 Units Oral Daily  . enoxaparin (LOVENOX) injection  40 mg Subcutaneous Q12H  . furosemide  40 mg Intravenous BID  . gabapentin  300 mg Oral TID  . insulin aspart  0-5 Units Subcutaneous QHS  . insulin aspart  0-9 Units Subcutaneous TID WC  . levothyroxine  250 mcg Oral Q0600  . multivitamin with minerals  1 tablet Oral Daily  . propranolol  20 mg Oral TID  . rOPINIRole  3 mg Oral TID  . sodium chloride flush  3 mL Intravenous Q12H  . zinc sulfate  220 mg Oral Daily   Continuous Infusions: . sodium  chloride       Anti-infectives (From admission, onward)   None             Family Communication/Anticipated D/C date and plan/Code Status   DVT prophylaxis: enoxaparin (LOVENOX) injection 40 mg Start: 04/09/20 2200     Code Status: Full Code  Family Communication: Plan discussed with husband at bedside Disposition Plan:    Status is: Inpatient  Remains inpatient appropriate because:IV treatments appropriate due to intensity of illness or inability to take PO and Inpatient level of care appropriate due to severity of illness   Dispo: The patient is from: Home              Anticipated d/c is to: Home              Anticipated d/c date is: 3 days              Patient currently is not medically stable to d/c.           Subjective:   She complains of bilateral leg swelling.  She feels short of breath but not as bad as it was yesterday.  Objective:    Vitals:   04/09/20 2235 04/10/20 0000 04/10/20 0400 04/10/20 0742  BP: 91/63 [name])  93/59 (!) 99/57 94/63  Pulse: (!) 53 (!) 52  (!) 56  Resp: 18 19  20   Temp:   98.4 F (36.9 C) 98.9 F (37.2 C)  TempSrc:   Axillary Axillary  SpO2: 96% 95% 100% 94%  Weight:   (!) 152.9 kg   Height:       No data found.   Intake/Output Summary (Last 24 hours) at 04/10/2020 1051 Last data filed at 04/10/2020 0418 Gross per 24 hour  Intake 360 ml  Output 400 ml  Net -40 ml   Filed Weights   04/09/20 1300 04/10/20 0400  Weight: (!) 148.8 kg (!) 152.9 kg    Exam:  GEN: NAD, on BiPAP, morbidly obese SKIN: Warm and dry EYES: EOMI ENT: MMM CV: RRR PULM: CTA B ABD: soft, obese, NT, +BS CNS: AAO x 3, non focal EXT: B/leg edema with erythema, no tenderness   Data Reviewed:   I have personally reviewed following labs and imaging studies:  Labs: Labs show the following:   Basic Metabolic Panel: Recent Labs  Lab 04/09/20 1303 04/10/20 0629  NA 137 140  K 4.7 5.0  CL 98 97*  CO2 28 32  GLUCOSE 100* 80    BUN 63* 63*  CREATININE 2.23* 2.38*  CALCIUM 8.4* 8.3*  MG  --  2.4   GFR Estimated Creatinine Clearance: 35 mL/min (A) (by C-G formula based on SCr of 2.38 mg/dL (H)). Liver Function Tests: No results for input(s): AST, ALT, ALKPHOS, BILITOT, PROT, ALBUMIN in the last 168 hours. No results for input(s): LIPASE, AMYLASE in the last 168 hours. No results for input(s): AMMONIA in the last 168 hours. Coagulation profile No results for input(s): INR, PROTIME in the last 168 hours.  CBC: Recent Labs  Lab 04/09/20 1303 04/10/20 0629  WBC 11.8* 12.6*  NEUTROABS 8.3*  --   HGB 12.2 10.8*  HCT 41.3 36.0  MCV 97.2 95.2  PLT 445* 373   Cardiac Enzymes: No results for input(s): CKTOTAL, CKMB, CKMBINDEX, TROPONINI in the last 168 hours. BNP (last 3 results) No results for input(s): PROBNP in the last 8760 hours. CBG: Recent Labs  Lab 04/09/20 1847 04/09/20 2113 04/09/20 2330 04/10/20 0406 04/10/20 0832  GLUCAP 80 71 75 75 74   D-Dimer: No results for input(s): DDIMER in the last 72 hours. Hgb A1c: Recent Labs    04/09/20 1641  HGBA1C 5.8*   Lipid Profile: Recent Labs    04/10/20 0629  CHOL 91  HDL 13*  LDLCALC 56  TRIG 108  CHOLHDL 7.0   Thyroid function studies: Recent Labs    04/09/20 1303  TSH 0.738   Anemia work up: No results for input(s): VITAMINB12, FOLATE, FERRITIN, TIBC, IRON, RETICCTPCT in the last 72 hours. Sepsis Labs: Recent Labs  Lab 04/09/20 1303 04/10/20 0629  WBC 11.8* 12.6*    Microbiology Recent Results (from the past 240 hour(s))  Respiratory Panel by RT PCR (Flu A&B, Covid) - Nasopharyngeal Swab     Status: None   Collection Time: 04/09/20  1:17 PM   Specimen: Nasopharyngeal Swab  Result Value Ref Range Status   SARS Coronavirus 2 by RT PCR NEGATIVE NEGATIVE Final    Comment: (NOTE) SARS-CoV-2 target nucleic acids are NOT DETECTED.  The SARS-CoV-2 RNA is generally detectable in upper respiratoy specimens during the acute  phase of infection. The lowest concentration of SARS-CoV-2 viral copies this assay can detect is 131 copies/mL. A negative result does not preclude SARS-Cov-2 infection and  should not be used as the sole basis for treatment or other patient management decisions. A negative result may occur with  improper specimen collection/handling, submission of specimen other than nasopharyngeal swab, presence of viral mutation(s) within the areas targeted by this assay, and inadequate number of viral copies (<131 copies/mL). A negative result must be combined with clinical observations, patient history, and epidemiological information. The expected result is Negative.  Fact Sheet for Patients:  PinkCheek.be  Fact Sheet for Healthcare Providers:  GravelBags.it  This test is no t yet approved or cleared by the [place] FDA and  has been authorized for detection and/or diagnosis of SARS-CoV-2 by FDA under an Emergency Use Authorization (EUA). This EUA will remain  in effect (meaning this test can be used) for the duration of the COVID-19 declaration under Section 564(b)(1) of the Act, 21 U.S.C. section 360bbb-3(b)(1), unless the authorization is terminated or revoked sooner.     Influenza A by PCR NEGATIVE NEGATIVE Final   Influenza B by PCR NEGATIVE NEGATIVE Final    Comment: (NOTE) The Xpert Xpress SARS-CoV-2/FLU/RSV assay is intended as an aid in  the diagnosis of influenza from Nasopharyngeal swab specimens and  should not be used as a sole basis for treatment. Nasal washings and  aspirates are unacceptable for Xpert Xpress SARS-CoV-2/FLU/RSV  testing.  Fact Sheet for Patients: PinkCheek.be  Fact Sheet for Healthcare Providers: GravelBags.it  This test is not yet approved or cleared by the [place] FDA and  has been authorized for detection and/or diagnosis of  SARS-CoV-2 by  FDA under an Emergency Use Authorization (EUA). This EUA will remain  in effect (meaning this test can be used) for the duration of the  Covid-19 declaration under Section 564(b)(1) of the Act, 21  U.S.C. section 360bbb-3(b)(1), unless the authorization is  terminated or revoked. Performed at [facility], [address] [place], [place] [address]     Procedures and diagnostic studies:  DG Chest Portable 1 View  Result Date: 04/09/2020 CLINICAL DATA:  Shortness of breath EXAM: PORTABLE CHEST 1 VIEW COMPARISON:  February 21, 2020 FINDINGS: There is cardiomegaly with pulmonary venous hypertension. There is mild interstitial edema. No consolidation. No adenopathy. No bone lesions. IMPRESSION: Cardiomegaly with pulmonary vascular congestion. Mild interstitial edema. Overall appearance is felt to be indicative of congestive heart failure. No consolidation. Electronically Signed   By: Lowella Grip III M.D.   On: 04/09/2020 13:59               LOS: 1 day   Marcin Holte  [facility]   Pager on [email or web address]. If 7PM-7AM, please contact night-coverage at [email or web address]     04/10/2020, 10:51 AM

## 2020-04-10 NOTE — Progress Notes (Addendum)
OT Cancellation Note  Patient Details Name: Madison Mcbride MRN: [id number] DOB: 12-03-1955   Cancelled Treatment:    Reason Eval/Treat Not Completed: Medical issues which prohibited therapy. Consult received, chart reviewed. Pt noted on BiPAP, blood glucose at 75. OT contraindicated at this time. Will re-attempt at later date/time as medically appropriate.   Josiah Lobo, PhD, [place], OTR/L ascom [phone] 04/10/20, 8:20 AM

## 2020-04-11 ENCOUNTER — Inpatient Hospital Stay: Payer: BC Managed Care – PPO

## 2020-04-11 LAB — GLUCOSE, CAPILLARY
Glucose-Capillary: 84 mg/dL (ref 70–99)
Glucose-Capillary: 110 mg/dL — ABNORMAL HIGH (ref 70–99)
Glucose-Capillary: 112 mg/dL — ABNORMAL HIGH (ref 70–99)
Glucose-Capillary: 107 mg/dL — ABNORMAL HIGH (ref 70–99)

## 2020-04-11 LAB — CBC WITH DIFFERENTIAL/PLATELET
Abs Immature Granulocytes: 0.07 10*3/uL (ref 0.00–0.07)
Basophils Absolute: 0 10*3/uL (ref 0.0–0.1)
Basophils Relative: 0 %
Eosinophils Absolute: 0.1 10*3/uL (ref 0.0–0.5)
Eosinophils Relative: 1 %
HCT: 34.4 % — ABNORMAL LOW (ref 36.0–46.0)
Hemoglobin: 10.8 g/dL — ABNORMAL LOW (ref 12.0–15.0)
Immature Granulocytes: 1 %
Lymphocytes Relative: 12 %
Lymphs Abs: 1.6 10*3/uL (ref 0.7–4.0)
MCH: 29.2 pg (ref 26.0–34.0)
MCHC: 31.4 g/dL (ref 30.0–36.0)
MCV: 93 fL (ref 80.0–100.0)
Monocytes Absolute: 1.2 10*3/uL — ABNORMAL HIGH (ref 0.1–1.0)
Monocytes Relative: 10 %
Neutro Abs: 9.8 10*3/uL — ABNORMAL HIGH (ref 1.7–7.7)
Neutrophils Relative %: 76 %
Platelets: 343 10*3/uL (ref 150–400)
RBC: 3.7 MIL/uL — ABNORMAL LOW (ref 3.87–5.11)
RDW: 21.9 % — ABNORMAL HIGH (ref 11.5–15.5)
Smear Review: NORMAL
WBC: 12.8 10*3/uL — ABNORMAL HIGH (ref 4.0–10.5)
nRBC: 0.2 % (ref 0.0–0.2)

## 2020-04-11 LAB — BLOOD GAS, ARTERIAL
Acid-Base Excess: 7.5 mmol/L — ABNORMAL HIGH (ref 0.0–2.0)
Bicarbonate: 35.1 mmol/L — ABNORMAL HIGH (ref 20.0–28.0)
FIO2: 36
O2 Saturation: 95.2 %
Patient temperature: 37
pCO2 arterial: 65 mmHg — ABNORMAL HIGH (ref 32.0–48.0)
pH, Arterial: 7.34 — ABNORMAL LOW (ref 7.350–7.450)
pO2, Arterial: 81 mmHg — ABNORMAL LOW (ref 83.0–108.0)

## 2020-04-11 LAB — PROTEIN / CREATININE RATIO, URINE
Creatinine, Urine: 98 mg/dL
Protein Creatinine Ratio: 0.28 mg/mg{Cre} — ABNORMAL HIGH (ref 0.00–0.15)
Total Protein, Urine: 27 mg/dL

## 2020-04-11 LAB — URINALYSIS, ROUTINE W REFLEX MICROSCOPIC
Bilirubin Urine: NEGATIVE
Glucose, UA: NEGATIVE mg/dL
Ketones, ur: NEGATIVE mg/dL
Nitrite: POSITIVE — AB
Protein, ur: NEGATIVE mg/dL
Specific Gravity, Urine: 1.011 (ref 1.005–1.030)
WBC, UA: >50 WBC/hpf — ABNORMAL HIGH (ref 0–5)
pH: 5 (ref 5.0–8.0)

## 2020-04-11 LAB — BASIC METABOLIC PANEL
Anion gap: 12 (ref 5–15)
BUN: 67 mg/dL — ABNORMAL HIGH (ref 8–23)
CO2: 30 mmol/L (ref 22–32)
Calcium: 7.9 mg/dL — ABNORMAL LOW (ref 8.9–10.3)
Chloride: 99 mmol/L (ref 98–111)
Creatinine, Ser: 2.55 mg/dL — ABNORMAL HIGH (ref 0.44–1.00)
GFR calc Af Amer: 22 mL/min — ABNORMAL LOW (ref >60)
GFR calc non Af Amer: 19 mL/min — ABNORMAL LOW (ref >60)
Glucose, Bld: 90 mg/dL (ref 70–99)
Potassium: 4.6 mmol/L (ref 3.5–5.1)
Sodium: 141 mmol/L (ref 135–145)

## 2020-04-11 MED ORDER — MIDODRINE HCL 5 MG PO TABS
10.0000 mg | ORAL_TABLET | Freq: Three times a day (TID) | ORAL | Status: DC
  Administered 2020-04-11 – 2020-04-17 (×16): 10 mg via ORAL
  Filled 2020-04-11 (×16): qty 2

## 2020-04-11 NOTE — Progress Notes (Signed)
  Heart Failure Nurse Navigator Note  HFpEF 50-55% by echocardiogram 02/2020.  Patient presented from home with complaints of worsening SOB for 3 days.  02 saturations of 65% on room air.  Also noted dry cough.  Comorbidities:  OSA Morbid Obesity CKD stage 3 Diabetes Hypertension Hyperlipidemia  Medications:  Aspirin 81 mg daily Lipitor 20 mg daily Lasix on hold Inderal discontinued  Labs:  Sodium 141, potassium 4.6, BUN 67 (63), creatinine 2.55 (yesterday 2.38)  Intake 480 ml Output 600 ml    Weight 151.9 kg (152.9)  Stopped by patients room to see if they had any further questions on written heart failure materials, they states they had not read it but planned to.  Also gave then today the heart failure zones magnet and discussed the various zones. What and when to report.  Will continue to follow.  Pricilla Riffle RN, [facility]

## 2020-04-11 NOTE — Progress Notes (Signed)
[facility] Encounter Note  Patient: Madison Mcbride / Admit Date: 04/09/2020 / Date of Encounter: 04/11/2020, 8:59 AM   Subjective: Patient overall feeling slightly better admission with no worsening shortness of breath at this time and pulmonary exam improved.  Breathing better and able to sit up without use of BiPAP at this time.  Patient's oxygenation appears to be slightly better.  Lower extremities show significant improvements.  Primary chronic kidney disease stage IV not significantly changed from admission although would consider discontinuation of intravenous Lasix today due to hypotension and concerns and reinstate as outpatient.  No evidence of myocardial infarction and or chest discomfort this morning.  The patient likely has acute on chronic diastolic dysfunction heart failure due to multiple factors including hypoxia sleep apnea morbid obesity and chronic kidney disease.  Review of Systems: Positive for: Shortness of breath Negative for: Vision change, hearing change, syncope, dizziness, nausea, vomiting,diarrhea, bloody stool, stomach pain, cough, congestion, diaphoresis, urinary frequency, urinary pain,skin lesions, skin rashes Others previously listed  Objective: Telemetry: Sinus bradycardia Physical Exam: Blood pressure (!) 94/55, pulse (!) 54, temperature 97.9 F (36.6 C), temperature source Oral, resp. rate 18, height 5\' 5"  (1.651 m), weight (!) 151.9 kg, SpO2 100 %. Body mass index is 55.71 kg/m. General: Well developed, well nourished, in no acute distress. Head: Normocephalic, atraumatic, sclera non-icteric, no xanthomas, nares are without discharge. Neck: No apparent masses Lungs: Normal respirations with no wheezes, few rhonchi, no rales , basilar crackles   Heart: Regular rate and rhythm, normal S1 S2, no murmur, no rub, no gallop, PMI is normal size and placement, carotid upstroke normal without bruit, jugular venous pressure  normal Abdomen: Soft, non-tender, distended with normoactive bowel sounds. No hepatosplenomegaly. Abdominal aorta is normal size without bruit Extremities: Trace to 1+ edema, no clubbing, no cyanosis, no ulcers,  Peripheral: 2+ radial, 2+ femoral, 2+ dorsal pedal pulses Neuro: Alert and oriented. Moves all extremities spontaneously. Psych:  Responds to questions appropriately with a normal affect.   Intake/Output Summary (Last 24 hours) at 04/11/2020 0859 Last data filed at 04/11/2020 0600 Gross per 24 hour  Intake 480 ml  Output 600 ml  Net -120 ml    Inpatient Medications:  . aspirin EC  81 mg Oral Daily  . atorvastatin  20 mg Oral Q2200  . buPROPion  150 mg Oral Daily  . cholecalciferol  1,000 Units Oral Daily  . enoxaparin (LOVENOX) injection  40 mg Subcutaneous Q12H  . gabapentin  300 mg Oral TID  . insulin aspart  0-5 Units Subcutaneous QHS  . insulin aspart  0-9 Units Subcutaneous TID WC  . levothyroxine  250 mcg Oral Q0600  . multivitamin with minerals  1 tablet Oral Daily  . rOPINIRole  3 mg Oral TID  . sodium chloride flush  3 mL Intravenous Q12H  . zinc sulfate  220 mg Oral Daily   Infusions:  . sodium chloride      Labs: Recent Labs    04/10/20 0629 04/11/20 0359  NA 140 141  K 5.0 4.6  CL 97* 99  CO2 32 30  GLUCOSE 80 90  BUN 63* 67*  CREATININE 2.38* 2.55*  CALCIUM 8.3* 7.9*  MG 2.4  --    No results for input(s): AST, ALT, ALKPHOS, BILITOT, PROT, ALBUMIN in the last 72 hours. Recent Labs    04/09/20 1303 04/09/20 1303 04/10/20 0629 04/11/20 0359  WBC 11.8*   < > 12.6* 12.8*  NEUTROABS 8.3*  --   --  9.8*  HGB 12.2   < > 10.8* 10.8*  HCT 41.3   < > 36.0 34.4*  MCV 97.2   < > 95.2 93.0  PLT 445*   < > 373 343   < > = values in this interval not displayed.   No results for input(s): CKTOTAL, CKMB, TROPONINI in the last 72 hours. Invalid input(s): POCBNP Recent Labs    04/09/20 1641  HGBA1C 5.8*     Weights: Filed Weights   04/09/20  1300 04/10/20 0400 04/11/20 0404  Weight: (!) 148.8 kg (!) 152.9 kg (!) 151.9 kg     Radiology/Studies:  DG Chest Portable 1 View  Result Date: 04/09/2020 CLINICAL DATA:  Shortness of breath EXAM: PORTABLE CHEST 1 VIEW COMPARISON:  February 21, 2020 FINDINGS: There is cardiomegaly with pulmonary venous hypertension. There is mild interstitial edema. No consolidation. No adenopathy. No bone lesions. IMPRESSION: Cardiomegaly with pulmonary vascular congestion. Mild interstitial edema. Overall appearance is felt to be indicative of congestive heart failure. No consolidation. Electronically Signed   By: Lowella Grip III M.D.   On: 04/09/2020 13:59     Assessment and Recommendation  64 y.o. female with acute on chronic diastolic dysfunction congestive heart failure multifactorial in nature including acute on chronic kidney dysfunction diabetes hypertension hyperlipidemia sleep apnea without evidence of myocardial infarction now essentially more at baseline from admission 1.  Continue supplemental oxygen as necessary but only using BiPAP at night as necessary 2.  Avoid intravenous Lasix due to chronic kidney disease stage IV hypotension and other concerns.  Would be okay with discharge to home with Demadex at 40 mg orally daily  3.  No further cardiac diagnostics necessary at this time 4.  Begin ambulation and follow-up for improvements of symptoms.  If patient is improved with no evidence of further significant symptoms and blood pressure stable would be okay for discharge to home from cardiac standpoint with follow-up next week for adjustments of medication management and continued decrease in episodes of heart failure. 5.  Will add other medication management including beta-blocker as outpatient.  Would avoid ACE inhibitor angiotensin receptor blocker Entresto and dapagliflozin due to significant chronic kidney disease 6.  Patient should have outpatient evaluation from nephrology  Signed, Serafina Royals M.D. FACC

## 2020-04-11 NOTE — Consult Note (Signed)
Pulmonary Medicine          Date: 04/13/2020,   MRN# [id number] Madison Mcbride [birth year]     AdmissionWeight: (!) 148.8 kg                 CurrentWeight: (!) 151.6 kg  Referring physician: Dr. 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  She also reported associated symptoms of cough which was dry and nonproductive however denied chest pain flulike illness nausea vomiting or diarrhea.  On arrival she was found to be acutely hypoxemic at 65% SPO2 but this did respond to supplemental oxygen and she was later placed on BiPAP which made her feel better.  She was Covid negative on arrival, had a venous blood gas drawn which showed mild acidemia with hypercapnia and hypoxemia.  Pulmonary consultation was placed for additional evaluation of acute on chronic hypoxemic and hypercapnic respiratory failure.   PAST MEDICAL HISTORY   Past Medical History:  Diagnosis Date  . Diabetes mellitus without complication ([place])   . Hypothyroidism   . RLS (restless legs syndrome)   . Thyroid cancer ([place]) 2007     SURGICAL HISTORY   Past Surgical History:  Procedure Laterality Date  . ABDOMINAL HYSTERECTOMY    . COLONOSCOPY WITH PROPOFOL N/A 08/26/2015   Procedure: COLONOSCOPY WITH PROPOFOL;  Surgeon: Manya Silvas, MD;  Location: [facility] ENDOSCOPY;  Service: Endoscopy;  Laterality: N/A;  . THYROIDECTOMY       FAMILY HISTORY   Family History  Problem Relation Age of Onset  . Breast cancer Neg Hx      SOCIAL HISTORY   Social History    Tobacco Use  . Smoking status: Never Smoker  . Smokeless tobacco: Never Used  Substance Use Topics  . Alcohol use: No  . Drug use: No     MEDICATIONS    Home Medication:    Current Medication:  Current Facility-Administered Medications:  .  0.9 %  sodium chloride infusion, 250 mL, Intravenous, PRN, Ivor Costa, MD .  0.9 %  sodium chloride infusion, , Intravenous, PRN, Jennye Boroughs, MD, Last Rate: 10 mL/hr at 04/12/20 2212, 250 mL at 04/12/20 2212 .  acetaminophen (TYLENOL) tablet 650 mg, 650 mg, Oral, Q6H PRN, Jennye Boroughs, MD .  albuterol (VENTOLIN HFA) 108 (90 Base) MCG/ACT inhaler 2 puff, 2 puff, Inhalation, Q4H PRN, Ivor Costa, MD .  aspirin EC tablet 81 mg, 81 mg, Oral, Daily, Ivor Costa, MD, 81 mg at 04/13/20 0842 .  atorvastatin (LIPITOR) tablet 20 mg, 20 mg, Oral, Q2200, Ivor Costa, MD, 20 mg at 04/11/20 2226 .  buPROPion (WELLBUTRIN SR) 12 hr tablet 150 mg, 150 mg, Oral, Daily, Ivor Costa, MD, 150 mg at 04/13/20 0842 .  cefTRIAXone (ROCEPHIN) 1 g in sodium chloride 0.9 % 100 mL IVPB, 1 g, Intravenous, Q24H, Jennye Boroughs, MD, Last Rate: 200 mL/hr at 04/12/20 2220, 1 g at 04/12/20 2220 .  cholecalciferol (VITAMIN D) tablet 1,000 Units, 1,000 Units, Oral, Daily, Ivor Costa, MD, 1,000 Units at 04/13/20 [phone] .  dextromethorphan-guaiFENesin (  MUCINEX DM) 30-600 MG per 12 hr tablet 1 tablet, 1 tablet, Oral, BID PRN, Ivor Costa, MD .  enoxaparin (LOVENOX) injection 40 mg, 40 mg, Subcutaneous, Q12H, Ivor Costa, MD, 40 mg at 04/13/20 0843 .  gabapentin (NEURONTIN) capsule 300 mg, 300 mg, Oral, TID, Ivor Costa, MD, 300 mg at 04/13/20 0842 .  hydrALAZINE (APRESOLINE) injection 5 mg, 5 mg, Intravenous, Q2H PRN, Ivor Costa, MD .  hydrOXYzine (ATARAX/VISTARIL) tablet 10 mg, 10 mg, Oral, TID PRN, Ivor Costa, MD .  insulin aspart (novoLOG) injection 0-5 Units, 0-5 Units, Subcutaneous, QHS, Niu, Xilin, MD .  insulin aspart (novoLOG) injection 0-9 Units, 0-9 Units, Subcutaneous, TID  WC, Ivor Costa, MD, 1 Units at 04/12/20 1756 .  levothyroxine (SYNTHROID) tablet 250 mcg, 250 mcg, Oral, Q0600, Ivor Costa, MD, 250 mcg at 04/13/20 0721 .  midodrine (PROAMATINE) tablet 10 mg, 10 mg, Oral, TID WC, Candiss Norse, [place], MD, 10 mg at 04/13/20 0842 .  multivitamin with minerals tablet 1 tablet, 1 tablet, Oral, Daily, Ivor Costa, MD, 1 tablet at 04/13/20 [phone] .  oxyCODONE (Oxy IR/ROXICODONE) immediate release tablet 5 mg, 5 mg, Oral, Q8H PRN, Jennye Boroughs, MD, 5 mg at 04/12/20 1302 .  rOPINIRole (REQUIP) tablet 3 mg, 3 mg, Oral, TID, Ivor Costa, MD, 3 mg at 04/13/20 0842 .  sodium chloride flush (NS) 0.9 % injection 3 mL, 3 mL, Intravenous, Q12H, Ivor Costa, MD, 3 mL at 04/13/20 0844 .  sodium chloride flush (NS) 0.9 % injection 3 mL, 3 mL, Intravenous, PRN, Ivor Costa, MD, 3 mL at 04/09/20 2136 .  zinc sulfate capsule 220 mg, 220 mg, Oral, Daily, Ivor Costa, MD, 220 mg at 04/13/20 9211    ALLERGIES   Patient has no known allergies.     REVIEW OF SYSTEMS    Review of Systems:  Gen:  Denies  fever, sweats, chills weigh loss  HEENT: Denies blurred vision, double vision, ear pain, eye pain, hearing loss, nose bleeds, sore throat Cardiac:  No dizziness, chest pain or heaviness, chest tightness,edema Resp:   Denies cough or sputum porduction, shortness of breath,wheezing, hemoptysis,  Gi: Denies swallowing difficulty, stomach pain, nausea or vomiting, diarrhea, constipation, bowel incontinence Gu:  Denies bladder incontinence, burning urine Ext:   Denies Joint pain, stiffness or swelling Skin: Denies  skin rash, easy bruising or bleeding or hives Endoc:  Denies polyuria, polydipsia , polyphagia or weight change Psych:   Denies depression, insomnia or hallucinations   Other:  All other systems negative   VS: BP (!) 112/50 (BP Location: Right Arm)   Pulse 66   Temp 98.5 F (36.9 C) (Oral)   Resp 16   Ht 5\' 5"  (1.651 m)   Wt (!) 151.6 kg   SpO2 95%   BMI 55.62 kg/m       PHYSICAL EXAM    GENERAL:NAD, no fevers, chills, no weakness no fatigue HEAD: Normocephalic, atraumatic.  EYES: Pupils equal, round, reactive to light. Extraocular muscles intact. No scleral icterus.  MOUTH: Moist mucosal membrane. Dentition intact. No abscess noted.  EAR, NOSE, THROAT: Clear without exudates. No external lesions.  NECK: Supple. No thyromegaly. No nodules. No JVD.  PULMONARY: Decreased breath sounds bilaterally without wheezing or rhonchorous lung sounds CARDIOVASCULAR: S1 and S2. Regular rate and rhythm. No murmurs, rubs, or gallops. No edema. Pedal pulses 2+ bilaterally.  GASTROINTESTINAL: Soft, nontender, nondistended. No masses. Positive bowel sounds. No hepatosplenomegaly.  Obese abdomen MUSCULOSKELETAL: No swelling, clubbing, or edema. Range of motion full in all  extremities.  NEUROLOGIC: Cranial nerves II through XII are intact. No gross focal neurological deficits. Sensation intact. Reflexes intact.  SKIN: No ulceration, lesions, rashes, or cyanosis. Skin warm and dry. Turgor intact.  PSYCHIATRIC: Mood, affect within normal limits. The patient is awake, alert and oriented x 3. Insight, judgment intact.       IMAGING    US RENAL  Result Date: 04/11/2020 CLINICAL DATA:  Acute renal failure EXAM: RENAL / URINARY TRACT ULTRASOUND COMPLETE COMPARISON:  Renal ultrasound 10/22/2019 FINDINGS: Right Kidney: Renal measurements: 10.8 x 6.6 x 6.2 cm = volume: 233 mL. Echogenicity is within normal limits. Mild cortical thinning and renal sinus lipomatosis. Simple appearing 1.4 x 1.3 x 1.3 cm cyst arising from the upper pole right kidney. No concerning renal mass, shadowing calculus or hydronephrosis. Left Kidney: Renal measurements: 13.5 x 7.2 x 6.4 cm = volume: 323.4 mL. Echogenicity is within normal limits. Mild cortical thinning and renal sinus lipomatosis. No concerning renal mass, shadowing calculus or hydronephrosis. Bladder: Appears normal for degree of bladder  distention. Other: None. IMPRESSION: Kidneys appear slightly enlarged bilaterally (upper limits normal for females 150 mL). With some chronic renal cortical thinning and renal sinus lipomatosis, can reflect a degree of chronic renal failure or diabetic nephropathy but with preservation of the parenchymal echogenicity arguing against a more acute medical renal disease. Simple 1.4 cm cyst in the upper pole right kidney. Electronically Signed   By: Lovena Le M.D.   On: 04/11/2020 19:19   DG Chest Port 1 View  Result Date: 04/12/2020 CLINICAL DATA:  Short of breath.  Cough EXAM: PORTABLE CHEST 1 VIEW COMPARISON:  04/01/2020 FINDINGS: Enlarged cardiac silhouette with ectatic aorta. There is patchy bilateral airspace disease. No focal consolidation. No pneumothorax. Mild improvement in airspace opacities. IMPRESSION: Patchy bilateral airspace disease suggests pulmonary edema. Mild improvement from comparison exam. Electronically Signed   By: Suzy Bouchard M.D.   On: 04/12/2020 10:40   DG Chest Portable 1 View  Result Date: 04/09/2020 CLINICAL DATA:  Shortness of breath EXAM: PORTABLE CHEST 1 VIEW COMPARISON:  February 21, 2020 FINDINGS: There is cardiomegaly with pulmonary venous hypertension. There is mild interstitial edema. No consolidation. No adenopathy. No bone lesions. IMPRESSION: Cardiomegaly with pulmonary vascular congestion. Mild interstitial edema. Overall appearance is felt to be indicative of congestive heart failure. No consolidation. Electronically Signed   By: Lowella Grip III M.D.   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 -Likely due to thoracic restriction with obesity hypoventilation syndrome and obstructive sleep apnea overlap -Would like to perform spirometry with graph to evaluate residual lung function and stratification of ventilatory defect -Arterial blood gas to allow patient for BiPAP qualification for  home use -Consultation with [facility] for medical equipment and supplies  -Patient would benefit from bariatric evaluation -Complicated by fluid shifts secondary to heart failure and renal impairment -Would benefit from occupational and physical therapy -D-dimer to rule out pulmonary venous thromboembolism -RD nutritional evaluation for appropriate dietary changes with morbid obesity , hypothyroid and DM2     Thank you for allowing me to participate in the care of this patient.   Patient/Family are satisfied with care plan and all questions have been answered.   This document was prepared using Dragon voice recognition software and may include unintentional dictation errors.     Ottie Glazier, M.D.  Division of [facility]

## 2020-04-11 NOTE — Evaluation (Signed)
Occupational Therapy Evaluation Patient Details Name: Madison Mcbride MRN: [id number] DOB: Jun 26, 1956 Today's Date: 04/11/2020    History of Present Illness 64 y.o. female presented to ED wiht shortness of breath. Pt admitted with acute on chronic diastolic dysfunction congestive heart failure multifactorial in nature including acute on chronic kidney dysfunction and PMHx of CKD3, DM, HTN, HLD, and sleep apnea without evidence of myocardial infarction.   Clinical Impression   Pt was seen for OT evaluation this date. Prior to hospital admission, pt was recently returned home from SNF. Pt required assist for ADL transfers from spouse, was walking short household distances with 2WW and supervision assist, requiring the w/c for longer household and community distances. Pt/spouse endorse a "couple" falls in past 6 months. Pt denies home O2 use prior to this admission. Pt/spouse endorse that spouse assisted with clothing mgt after toileting as well as can initiate LB bathing/dressing but requires assist for completing task in standing from spouse. Spouse and pt work together to set up medications. A friend comes a couple hours a day a few times per week to assist pt with meal prep and toileting as well. Pt on 4.5L O2 at time of evaluation, denied SOB, primarily concerned about back pain. Pain worsened with transition to sitting EOB (Max1-2 assist for bed mobility), making it unsafe to attempt STS transfer EOB. Currently pt demonstrates impairments as described below (See OT problem list) which functionally limit her ability to perform ADL/self-care tasks at baseline levels. Pt currently requires Max A for LB ADL tasks, +2 assist for ADL transfers. Pt/spouse instructed in bed mobility training, introduced AE/DME and home/routines modifications to improve safety/indep with ADL.  Pt would benefit from skilled OT services to address noted impairments and functional limitations (see below for any additional  details) in order to maximize safety and independence while minimizing falls risk and caregiver burden. Upon hospital discharge, recommend STR to maximize pt safety and return to PLOF.     Follow Up Recommendations  SNF    Equipment Recommendations  None recommended by OT    Recommendations for Other Services       Precautions / Restrictions Precautions Precautions: Fall Restrictions Weight Bearing Restrictions: No      Mobility Bed Mobility Overal bed mobility: Needs Assistance Bed Mobility: Supine to Sit;Sit to Supine     Supine to sit: Max assist;HOB elevated Sit to supine: Max assist;+2 for physical assistance   General bed mobility comments: VC for hand placement to improve pt's participation, ultimately requiring Max + assist for mobility - anticipate need for +2 assist with future mobility attempts  Transfers                 General transfer comment: pt unable to attend 2/2 significant back pain    Balance Overall balance assessment: Needs assistance Sitting-balance support: Feet supported;Bilateral upper extremity supported;Single extremity supported Sitting balance-Leahy Scale: Fair Sitting balance - Comments: pt relying moderately on bed rail for support, no LOB                                   ADL either performed or assessed with clinical judgement   ADL Overall ADL's : Needs assistance/impaired                                       General  ADL Comments: Min A for UB bathing/dressing, Max A for LB bathing/dressing; CGA to Supervision for seated grooming tasks (limited in sitting duration 2/2 back pain); anticipate +2 assist for toilet transfer     Vision         Perception     Praxis      Pertinent Vitals/Pain Pain Assessment: 0-10 Pain Score: 7  Pain Location: middle back, improved with repositioning Pain Descriptors / Indicators: Aching Pain Intervention(s): Limited activity within patient's  tolerance;Monitored during session;Repositioned     Hand Dominance     Extremity/Trunk Assessment Upper Extremity Assessment Upper Extremity Assessment: Generalized weakness (L grip slightly worse than R (hx carpal tunnel); otherwise grossly 3+/5 to 4/5 bilat)   Lower Extremity Assessment Lower Extremity Assessment: Generalized weakness       Communication Communication Communication: No difficulties   Cognition Arousal/Alertness: Awake/alert Behavior During Therapy: WFL for tasks assessed/performed Overall Cognitive Status: Within Functional Limits for tasks assessed                                     General Comments  BP 112/67    Exercises Other Exercises Other Exercises: Pt/spouse instructed in falls prevention strategies, bed positioning for improved comfort and minimal pressure injury risk; functional bed mobility training, instructed in ECS including PLB   Shoulder Instructions      Home Living Family/patient expects to be discharged to:: Private residence Living Arrangements: Spouse/significant other Available Help at Discharge: Family;Friend(s);Available 24 hours/day;Other (Comment) (husband employed, pt states she has a friend she can call during the day if she needs anything) Type of Home: House Home Access: Stairs to enter [employer] of Steps: 5 Entrance Stairs-Rails: Right;Left;Can reach both Home Layout: One level     Bathroom Shower/Tub: Tub/shower unit;Walk-in shower   Bathroom Toilet: Handicapped height (BSC over toilet) Bathroom Accessibility: Yes   Home Equipment: Walker - 4 wheels;Shower seat;Grab bars - toilet;Grab bars - tub/shower;Walker - 2 wheels;Bedside commode;Wheelchair - manual          Prior Functioning/Environment Level of Independence: Needs assistance  Gait / Transfers Assistance Needed: since returning home from rehab after Aug 21 hospital admission, pt ambulating short [place] distances wiht 2WW and using  W/C for longer HH distance and outside the home; assist for transfers ADL's / Homemaking Assistance Needed: assist for clothing mgt and dressing to pull up over hips in standing 2/2 decr balance; assist for meal prep, med mgt set up   Comments: endorses a couple falls in past 44mo        OT Problem List: Decreased strength;Cardiopulmonary status limiting activity;Pain;Decreased range of motion;Increased edema;Obesity;Decreased knowledge of use of DME or AE;Impaired balance (sitting and/or standing);Decreased activity tolerance      OT Treatment/Interventions: Self-care/ADL training;Therapeutic exercise;Therapeutic activities;Energy conservation;DME and/or AE instruction;Patient/family education;Balance training    OT Goals(Current goals can be found in the care plan section) Acute Rehab OT Goals Patient Stated Goal: get better and go home OT Goal Formulation: With patient/family Time For Goal Achievement: 04/25/20 Potential to Achieve Goals: Good ADL Goals Pt Will Perform Lower Body Dressing: with min assist;sit to/from stand;with adaptive equipment (Min +2) Pt Will Transfer to Toilet: with min assist;bedside commode;ambulating (+2 assist, LRAD for amb) Additional ADL Goal #1: Pt will verbalize plan to implement at least 1 learned energy conservation strategy to minimize SOB during ADL tasks.  OT Frequency: Min 2X/week   Barriers to D/C:  Co-evaluation              AM-PAC OT "6 Clicks" Daily Activity     Outcome Measure Help from another person eating meals?: None Help from another person taking care of personal grooming?: A Little Help from another person toileting, which includes using toliet, bedpan, or urinal?: Total Help from another person bathing (including washing, rinsing, drying)?: A Lot Help from another person to put on and taking off regular upper body clothing?: A Little Help from another person to put on and taking off regular lower body clothing?: A  Lot 6 Click Score: 15   End of Session Equipment Utilized During Treatment: Oxygen Nurse Communication: Other (comment) (PT order)  Activity Tolerance: Patient limited by pain Patient left: in bed;with call bell/phone within reach;with bed alarm set;with family/visitor present  OT Visit Diagnosis: Other abnormalities of gait and mobility (R26.89);Muscle weakness (generalized) (M62.81);History of falling (Z91.81);Pain Pain - Right/Left:  (back)                Time: 6433-2951 OT Time Calculation (min): 63 min Charges:  OT General Charges $OT Visit: 1 Visit OT Evaluation $OT Eval Moderate Complexity: 1 Mod OT Treatments $Self Care/Home Management : 23-37 mins $Therapeutic Activity: 23-37 mins  Jeni Salles, MPH, MS, OTR/L ascom [phone] 04/11/20, 3:33 PM

## 2020-04-11 NOTE — Progress Notes (Signed)
Progress Note    Madison Mcbride  CHE:[id number] DOB: 1955/07/26  DOA: 04/09/2020 PCP: Rusty Aus, MD      Brief Narrative:    Medical records reviewed and are as summarized below:  Madison Mcbride is a 64 y.o. female       Assessment/Plan:   Principal Problem:   Acute on chronic diastolic CHF (congestive heart failure) ([place]) Active Problems:   Hypothyroidism   Acute respiratory failure with hypoxia and hypercapnia (HCC)   Acute renal failure superimposed on stage 3a chronic kidney disease ([place])   Diabetes mellitus without complication (HCC)   HLD (hyperlipidemia)   Depression   Elevated troponin   OSA (obstructive sleep apnea)  Hypotension AKI on CKD stage IIIa Anemia of chronic disease  Body mass index is 55.71 kg/m.  (Morbid obesity)   PLAN  Blood gas ordered today showed pH of 7.34, PCO2 of 65 and PO2 of 81 and this was done on 4 L/min oxygen via nasal cannula.  Suspect patient may have underlying obesity hypoventilation syndrome causing chronic hypercapnia.  She may need BiPAP at home.  Consult pulmonologist to assist with management.  Discontinue IV Lasix.  Monitor urine output closely. Consult nephrologist for AKI. Propranolol has been discontinued because of hypotension. Humalog as needed for hyperglycemia. Follow-up with cardiologist.   Diet Order            Diet 2 gram sodium Room service appropriate? Yes; Fluid consistency: Thin  Diet effective now                    Consultants:  Cardiologist  Procedures:  None    Medications:   . aspirin EC  81 mg Oral Daily  . atorvastatin  20 mg Oral Q2200  . buPROPion  150 mg Oral Daily  . cholecalciferol  1,000 Units Oral Daily  . enoxaparin (LOVENOX) injection  40 mg Subcutaneous Q12H  . gabapentin  300 mg Oral TID  . insulin aspart  0-5 Units Subcutaneous QHS  . insulin aspart  0-9 Units Subcutaneous TID WC  . levothyroxine  250 mcg Oral Q0600  . midodrine   10 mg Oral TID WC  . multivitamin with minerals  1 tablet Oral Daily  . rOPINIRole  3 mg Oral TID  . sodium chloride flush  3 mL Intravenous Q12H  . zinc sulfate  220 mg Oral Daily   Continuous Infusions: . sodium chloride       Anti-infectives (From admission, onward)   None             Family Communication/Anticipated D/C date and plan/Code Status   DVT prophylaxis: enoxaparin (LOVENOX) injection 40 mg Start: 04/09/20 2200     Code Status: Full Code  Family Communication: Plan discussed with husband at bedside Disposition Plan:    Status is: Inpatient  Remains inpatient appropriate because:IV treatments appropriate due to intensity of illness or inability to take PO and Inpatient level of care appropriate due to severity of illness   Dispo: The patient is from: Home              Anticipated d/c is to: Home              Anticipated d/c date is: 3 days              Patient currently is not medically stable to d/c.           Subjective:   Interval events  noted.  She required BiPAP overnight and she has been switched to oxygen via nasal cannula.  She complains of decreased urine output.  No shortness of breath or chest pain.  Objective:    Vitals:   04/11/20 0404 04/11/20 0743 04/11/20 1126 04/11/20 1538  BP: (!) 96/50 (!) 94/55 (!) 94/48 (!) 93/56  Pulse: (!) 52 (!) 54 60 (!) 59  Resp:  18 19 18   Temp: 98.4 F (36.9 C) 97.9 F (36.6 C) 98.4 F (36.9 C) 98.4 F (36.9 C)  TempSrc: Axillary Oral Oral Oral  SpO2: 97% 100% 96% 95%  Weight: (!) 151.9 kg     Height:       No data found.   Intake/Output Summary (Last 24 hours) at 04/11/2020 1658 Last data filed at 04/11/2020 1544 Gross per 24 hour  Intake 840 ml  Output 700 ml  Net 140 ml   Filed Weights   04/09/20 1300 04/10/20 0400 04/11/20 0404  Weight: (!) 148.8 kg (!) 152.9 kg (!) 151.9 kg    Exam:   GEN: NAD SKIN: Warm and dry. EYES: No pallor or icterus ENT: MMM CV:  RRR PULM: CTA B ABD: soft, obese, NT, +BS CNS: AAO x 3, non focal EXT: Bilateral leg edema with erythema but no tenderness.    Data Reviewed:   I have personally reviewed following labs and imaging studies:  Labs: Labs show the following:   Basic Metabolic Panel: Recent Labs  Lab 04/09/20 1303 04/09/20 1303 04/10/20 0629 04/11/20 0359  NA 137  --  140 141  K 4.7   < > 5.0 4.6  CL 98  --  97* 99  CO2 28  --  32 30  GLUCOSE 100*  --  80 90  BUN 63*  --  63* 67*  CREATININE 2.23*  --  2.38* 2.55*  CALCIUM 8.4*  --  8.3* 7.9*  MG  --   --  2.4  --    < > = values in this interval not displayed.   GFR Estimated Creatinine Clearance: 32.5 mL/min (A) (by C-G formula based on SCr of 2.55 mg/dL (H)). Liver Function Tests: No results for input(s): AST, ALT, ALKPHOS, BILITOT, PROT, ALBUMIN in the last 168 hours. No results for input(s): LIPASE, AMYLASE in the last 168 hours. No results for input(s): AMMONIA in the last 168 hours. Coagulation profile No results for input(s): INR, PROTIME in the last 168 hours.  CBC: Recent Labs  Lab 04/09/20 1303 04/10/20 0629 04/11/20 0359  WBC 11.8* 12.6* 12.8*  NEUTROABS 8.3*  --  9.8*  HGB 12.2 10.8* 10.8*  HCT 41.3 36.0 34.4*  MCV 97.2 95.2 93.0  PLT 445* 373 343   Cardiac Enzymes: No results for input(s): CKTOTAL, CKMB, CKMBINDEX, TROPONINI in the last 168 hours. BNP (last 3 results) No results for input(s): PROBNP in the last 8760 hours. CBG: Recent Labs  Lab 04/10/20 1744 04/10/20 2020 04/11/20 0739 04/11/20 1142 04/11/20 1644  GLUCAP 82 104* 84 110* 112*   D-Dimer: No results for input(s): DDIMER in the last 72 hours. Hgb A1c: Recent Labs    04/09/20 1641  HGBA1C 5.8*   Lipid Profile: Recent Labs    04/10/20 0629  CHOL 91  HDL 13*  LDLCALC 56  TRIG 108  CHOLHDL 7.0   Thyroid function studies: Recent Labs    04/09/20 1303  TSH 0.738   Anemia work up: No results for input(s): VITAMINB12, FOLATE,  FERRITIN, TIBC, IRON, RETICCTPCT in the  last 72 hours. Sepsis Labs: Recent Labs  Lab 04/09/20 1303 04/10/20 0629 04/11/20 0359  WBC 11.8* 12.6* 12.8*    Microbiology Recent Results (from the past 240 hour(s))  Respiratory Panel by RT PCR (Flu A&B, Covid) - Nasopharyngeal Swab     Status: None   Collection Time: 04/09/20  1:17 PM   Specimen: Nasopharyngeal Swab  Result Value Ref Range Status   SARS Coronavirus 2 by RT PCR NEGATIVE NEGATIVE Final    Comment: (NOTE) SARS-CoV-2 target nucleic acids are NOT DETECTED.  The SARS-CoV-2 RNA is generally detectable in upper respiratoy specimens during the acute phase of infection. The lowest concentration of SARS-CoV-2 viral copies this assay can detect is 131 copies/mL. A negative result does not preclude SARS-Cov-2 infection and should not be used as the sole basis for treatment or other patient management decisions. A negative result may occur with  improper specimen collection/handling, submission of specimen other than nasopharyngeal swab, presence of viral mutation(s) within the areas targeted by this assay, and inadequate number of viral copies (<131 copies/mL). A negative result must be combined with clinical observations, patient history, and epidemiological information. The expected result is Negative.  Fact Sheet for Patients:  PinkCheek.be  Fact Sheet for Healthcare Providers:  GravelBags.it  This test is no t yet approved or cleared by the [place] FDA and  has been authorized for detection and/or diagnosis of SARS-CoV-2 by FDA under an Emergency Use Authorization (EUA). This EUA will remain  in effect (meaning this test can be used) for the duration of the COVID-19 declaration under Section 564(b)(1) of the Act, 21 U.S.C. section 360bbb-3(b)(1), unless the authorization is terminated or revoked sooner.     Influenza A by PCR NEGATIVE NEGATIVE Final    Influenza B by PCR NEGATIVE NEGATIVE Final    Comment: (NOTE) The Xpert Xpress SARS-CoV-2/FLU/RSV assay is intended as an aid in  the diagnosis of influenza from Nasopharyngeal swab specimens and  should not be used as a sole basis for treatment. Nasal washings and  aspirates are unacceptable for Xpert Xpress SARS-CoV-2/FLU/RSV  testing.  Fact Sheet for Patients: PinkCheek.be  Fact Sheet for Healthcare Providers: GravelBags.it  This test is not yet approved or cleared by the [place] FDA and  has been authorized for detection and/or diagnosis of SARS-CoV-2 by  FDA under an Emergency Use Authorization (EUA). This EUA will remain  in effect (meaning this test can be used) for the duration of the  Covid-19 declaration under Section 564(b)(1) of the Act, 21  U.S.C. section 360bbb-3(b)(1), unless the authorization is  terminated or revoked. Performed at [facility], [address] [place], [place] [address]     Procedures and diagnostic studies:  No results found.             LOS: 2 days   Duel Conrad  [facility]   Pager on [email or web address]. If 7PM-7AM, please contact night-coverage at [email or web address]     04/11/2020, 4:58 PM

## 2020-04-11 NOTE — Progress Notes (Signed)
   04/11/20 2000  Assess: MEWS Score  Temp 98.8 F (37.1 C)  BP (!) 92/49  Pulse Rate (!) 55  ECG Heart Rate 60  Resp 15  Level of Consciousness Alert  SpO2 95 %  O2 Device Nasal Cannula  O2 Flow Rate (L/min) 4 L/min  Assess: MEWS Score  MEWS Temp 0  MEWS Systolic 1  MEWS Pulse 0  MEWS RR 0  MEWS LOC 0  MEWS Score 1  MEWS Score Color Green  Assess: if the MEWS score is Yellow or Red  Were vital signs taken at a resting state? Yes  Focused Assessment No change from prior assessment  Early Detection of Sepsis Score *See Row Information* Low  MEWS guidelines implemented *See Row Information* Yes  Treat  MEWS Interventions Other (Comment) (SBP with normal MAP)  Take Vital Signs  Increase Vital Sign Frequency  Yellow: Q 2hr X 2 then Q 4hr X 2, if remains yellow, continue Q 4hrs  Escalate  MEWS: Escalate Yellow: discuss with charge nurse/RN and consider discussing with provider and RRT  Notify: Charge Nurse/RN  Name of Charge Nurse/RN Notified Coralee North, RN (second RN consulted)  Date Charge Nurse/RN Notified 04/11/20  Time Charge Nurse/RN Notified 2000  Document  Patient Outcome Other (Comment) (will monitor SBP with normal MAP)

## 2020-04-11 NOTE — Consult Note (Signed)
[address] [place], [place] [address] Phone [phone]. Fax [phone]  Date: 04/11/2020                  Patient Name:  Madison Mcbride  MRN: [id number]  DOB: 04/13/1956  Age / Sex: 64 y.o., female         PCP: Rusty Aus, MD                 Service Requesting Consult: IM/ Jennye Boroughs, MD                 Reason for Consult: ARF            History of Present Illness: Patient is a 64 y.o. female with multiple medical problems was admitted to [facility] on 04/09/2020   Patient and her husband report that she has been experiencing weakness, cough, shortness of breath for about 2 to 3 days prior to admission.  No nausea, vomiting, diarrhea or abdominal pain.  She had low oxygen saturation of 65% on room air which improved with oxygen supplementation.  Patient uses CPAP at home for sleep apnea. Covid test is negative Chest x-ray showed cardiomegaly and interstitial edema  Baseline creatinine of 0.85 on August 18.  Admission creatinine of 2.23 Nephrology consult has been requested for evaluation In the hospital, patient's blood pressure has been on the low normal side.  Medications: Outpatient medications: Medications Prior to Admission  Medication Sig Dispense Refill Last Dose  . atorvastatin (LIPITOR) 20 MG tablet Take 20 mg by mouth daily at 10 pm.   04/08/2020 at Unknown time  . Biotin 2.5 MG CAPS Take 2.5 mg by mouth daily.    04/08/2020 at Unknown time  . buPROPion (WELLBUTRIN SR) 150 MG 12 hr tablet Take 150 mg by mouth daily.   04/08/2020 at Unknown time  . Cholecalciferol (D3-1000) 25 MCG (1000 UT) tablet Take 1,000 Units by mouth daily.   04/08/2020 at Unknown time  . furosemide (LASIX) 40 MG tablet Take 40 mg by mouth daily.   04/08/2020 at Unknown time  . gabapentin (NEURONTIN) 100 MG capsule Take 2 capsules (200 mg total) by mouth 3 (three) times daily. (Patient taking differently: Take 300 mg by mouth 3 (three) times daily. ) 180 capsule 0 04/08/2020  at Unknown time  . Multiple Vitamin (MULTIVITAMIN) tablet Take 1 tablet by mouth daily.   04/08/2020 at Unknown time  . propranolol (INDERAL) 20 MG tablet Take 20 mg by mouth 3 (three) times daily.   04/08/2020 at Unknown time  . rOPINIRole (REQUIP) 3 MG tablet Take 3 mg by mouth 3 (three) times daily.  3 04/08/2020 at Unknown time  . SYNTHROID 125 MCG tablet Take 250 mcg by mouth every morning.   04/08/2020 at Unknown time  . valsartan (DIOVAN) 160 MG tablet Take 160 mg by mouth daily.   04/08/2020 at Unknown time  . zinc gluconate 50 MG tablet Take 50 mg by mouth daily.   04/08/2020 at Unknown time  . acetaminophen (TYLENOL) 500 MG tablet Take 1,000 mg by mouth every 8 (eight) hours.   PRN at PRN  . methocarbamol (ROBAXIN) 500 MG tablet Take 1 tablet (500 mg total) by mouth every 8 (eight) hours as needed for muscle spasms. (Patient not taking: Reported on 04/09/2020) 15 tablet 0 Not Taking at Unknown time  . metolazone (ZAROXOLYN) 5 MG tablet Take 1 tablet by mouth daily as needed.    PRN at PRN  . nystatin (  MYCOSTATIN/NYSTOP) powder Apply topically 3 (three) times daily. (Patient not taking: Reported on 04/09/2020) 15 g 0 Not Taking at Unknown time  . OZEMPIC, 0.25 OR 0.5 MG/DOSE, 2 MG/1.5ML SOPN Inject 0.375 mLs into the skin every Saturday.    04/06/2020  . potassium chloride SA (KLOR-CON) 20 MEQ tablet Take 1 tablet (20 mEq total) by mouth daily. (Patient not taking: Reported on 04/09/2020) 30 tablet 0 Not Taking at Unknown time  . predniSONE (DELTASONE) 10 MG tablet 3 tabs po for three days; 2 tabs po for three days; 1 tab po for three days; 1/2 tab po for four days (Patient not taking: Reported on 04/09/2020) 20 tablet 0 Not Taking at Unknown time    Current medications: Current Facility-Administered Medications  Medication Dose Route Frequency Provider Last Rate Last Admin  . 0.9 %  sodium chloride infusion  250 mL Intravenous PRN Ivor Costa, MD      . acetaminophen (TYLENOL) tablet 650 mg  650 mg  Oral Q6H PRN Ivor Costa, MD   650 mg at 04/11/20 0946  . albuterol (VENTOLIN HFA) 108 (90 Base) MCG/ACT inhaler 2 puff  2 puff Inhalation Q4H PRN Ivor Costa, MD      . aspirin EC tablet 81 mg  81 mg Oral Daily Ivor Costa, MD   81 mg at 04/11/20 0945  . atorvastatin (LIPITOR) tablet 20 mg  20 mg Oral Q2200 Ivor Costa, MD   20 mg at 04/10/20 2135  . buPROPion (WELLBUTRIN SR) 12 hr tablet 150 mg  150 mg Oral Daily Ivor Costa, MD   150 mg at 04/11/20 0957  . cholecalciferol (VITAMIN D) tablet 1,000 Units  1,000 Units Oral Daily Ivor Costa, MD   1,000 Units at 04/11/20 0945  . dextromethorphan-guaiFENesin (MUCINEX DM) 30-600 MG per 12 hr tablet 1 tablet  1 tablet Oral BID PRN Ivor Costa, MD      . enoxaparin (LOVENOX) injection 40 mg  40 mg Subcutaneous Q12H Ivor Costa, MD   40 mg at 04/11/20 0944  . gabapentin (NEURONTIN) capsule 300 mg  300 mg Oral TID Ivor Costa, MD   300 mg at 04/11/20 0945  . hydrALAZINE (APRESOLINE) injection 5 mg  5 mg Intravenous Q2H PRN Ivor Costa, MD      . hydrOXYzine (ATARAX/VISTARIL) tablet 10 mg  10 mg Oral TID PRN Ivor Costa, MD      . insulin aspart (novoLOG) injection 0-5 Units  0-5 Units Subcutaneous QHS Ivor Costa, MD      . insulin aspart (novoLOG) injection 0-9 Units  0-9 Units Subcutaneous TID WC Ivor Costa, MD      . levothyroxine (SYNTHROID) tablet 250 mcg  250 mcg Oral [id number] Ivor Costa, MD   250 mcg at 04/11/20 6045  . multivitamin with minerals tablet 1 tablet  1 tablet Oral Daily Ivor Costa, MD   1 tablet at 04/11/20 0946  . rOPINIRole (REQUIP) tablet 3 mg  3 mg Oral TID Ivor Costa, MD   3 mg at 04/11/20 0944  . sodium chloride flush (NS) 0.9 % injection 3 mL  3 mL Intravenous Q12H Ivor Costa, MD   3 mL at 04/10/20 2138  . sodium chloride flush (NS) 0.9 % injection 3 mL  3 mL Intravenous PRN Ivor Costa, MD   3 mL at 04/09/20 2136  . zinc sulfate capsule 220 mg  220 mg Oral Daily Ivor Costa, MD   220 mg at 04/11/20 0946      Allergies: No Known  Allergies     Past Medical History: Past Medical History:  Diagnosis Date  . Diabetes mellitus without complication ([place])   . Hypothyroidism   . RLS (restless legs syndrome)   . Thyroid cancer ([place]) 2007     Past Surgical History: Past Surgical History:  Procedure Laterality Date  . ABDOMINAL HYSTERECTOMY    . COLONOSCOPY WITH PROPOFOL N/A 08/26/2015   Procedure: COLONOSCOPY WITH PROPOFOL;  Surgeon: Manya Silvas, MD;  Location: [facility] ENDOSCOPY;  Service: Endoscopy;  Laterality: N/A;  . THYROIDECTOMY       Family History: Family History  Problem Relation Age of Onset  . Breast cancer Neg Hx      Social History: Social History   Socioeconomic History  . Marital status: Married    Spouse name: Not on file  . Number of children: Not on file  . Years of education: Not on file  . Highest education level: Not on file  Occupational History  . Not on file  Tobacco Use  . Smoking status: Never Smoker  . Smokeless tobacco: Never Used  Substance and Sexual Activity  . Alcohol use: No  . Drug use: No  . Sexual activity: Not on file  Other Topics Concern  . Not on file  Social History Narrative  . Not on file   Social Determinants of Health   Financial Resource Strain:   . Difficulty of Paying Living Expenses: Not on file  Food Insecurity:   . Worried About Charity fundraiser in the Last Year: Not on file  . Ran Out of Food in the Last Year: Not on file  Transportation Needs:   . Lack of Transportation (Medical): Not on file  . Lack of Transportation (Non-Medical): Not on file  Physical Activity:   . Days of Exercise per Week: Not on file  . Minutes of Exercise per Session: Not on file  Stress:   . Feeling of Stress : Not on file  Social Connections:   . Frequency of Communication with Friends and Family: Not on file  . Frequency of Social Gatherings with Friends and Family: Not on file  . Attends Religious Services: Not on file  . Active Member of Clubs or  Organizations: Not on file  . Attends Archivist Meetings: Not on file  . Marital Status: Not on file  Intimate Partner Violence:   . Fear of Current or Ex-Partner: Not on file  . Emotionally Abused: Not on file  . Physically Abused: Not on file  . Sexually Abused: Not on file    Gen: Denies any fevers or chills HEENT: No vision or hearing problems CV: No chest pain or shortness of breath Resp: + cough, sputum production GI: No nausea, vomiting or diarrhea.  No blood in the stool GU : No problems with voiding.  No hematuria.  No previous history of kidney problems MS: Ambulatory.  Denies any acute joint pain or swelling Derm:   No complaints Psych: No complaints Heme: No complaints Neuro: No complaints Endocrine: No complaints   Vital Signs: Blood pressure (!) 94/48, pulse 60, temperature 98.4 F (36.9 C), temperature source Oral, resp. rate 19, height 5\' 5"  (1.651 m), weight (!) 151.9 kg, SpO2 96 %.   Intake/Output Summary (Last 24 hours) at 04/11/2020 1132 Last data filed at 04/11/2020 0954 Gross per 24 hour  Intake 960 ml  Output 300 ml  Net 660 ml    Weight trends: Filed Weights   04/09/20 1300 04/10/20  0400 04/11/20 0404  Weight: (!) 148.8 kg (!) 152.9 kg (!) 151.9 kg   Physical Exam: General:  No acute distress, laying in the bed, morbidly obese  HEENT  anicteric, moist oral mucous membrane  Pulm/lungs  normal breathing effort, b/basilar crackles,  O2  CVS/Heart  regular rhythm, no rub or gallop  Abdomen:   Soft, nontender  Extremities:  1+ dependent peripheral edema  Neurologic:  Alert, oriented, able to follow commands  Skin:  No acute rashes      Lab results: Basic Metabolic Panel: Recent Labs  Lab 04/09/20 1303 04/10/20 0629 04/11/20 0359  NA 137 140 141  K 4.7 5.0 4.6  CL 98 97* 99  CO2 28 32 30  GLUCOSE 100* 80 90  BUN 63* 63* 67*  CREATININE 2.23* 2.38* 2.55*  CALCIUM 8.4* 8.3* 7.9*  MG  --  2.4  --     Liver Function  Tests: No results for input(s): AST, ALT, ALKPHOS, BILITOT, PROT, ALBUMIN in the last 168 hours. No results for input(s): LIPASE, AMYLASE in the last 168 hours. No results for input(s): AMMONIA in the last 168 hours.  CBC: Recent Labs  Lab 04/09/20 1303 04/09/20 1303 04/10/20 0629 04/11/20 0359  WBC 11.8*   < > 12.6* 12.8*  NEUTROABS 8.3*  --   --  9.8*  HGB 12.2   < > 10.8* 10.8*  HCT 41.3   < > 36.0 34.4*  MCV 97.2   < > 95.2 93.0  PLT 445*   < > 373 343   < > = values in this interval not displayed.    Cardiac Enzymes: No results for input(s): CKTOTAL, TROPONINI in the last 168 hours.  BNP: Invalid input(s): POCBNP  CBG: Recent Labs  Lab 04/10/20 0832 04/10/20 1112 04/10/20 1744 04/10/20 2020 04/11/20 0739  GLUCAP 74 85 82 104* 84    Microbiology: Recent Results (from the past 720 hour(s))  Respiratory Panel by RT PCR (Flu A&B, Covid) - Nasopharyngeal Swab     Status: None   Collection Time: 04/09/20  1:17 PM   Specimen: Nasopharyngeal Swab  Result Value Ref Range Status   SARS Coronavirus 2 by RT PCR NEGATIVE NEGATIVE Final    Comment: (NOTE) SARS-CoV-2 target nucleic acids are NOT DETECTED.  The SARS-CoV-2 RNA is generally detectable in upper respiratoy specimens during the acute phase of infection. The lowest concentration of SARS-CoV-2 viral copies this assay can detect is 131 copies/mL. A negative result does not preclude SARS-Cov-2 infection and should not be used as the sole basis for treatment or other patient management decisions. A negative result may occur with  improper specimen collection/handling, submission of specimen other than nasopharyngeal swab, presence of viral mutation(s) within the areas targeted by this assay, and inadequate number of viral copies (<131 copies/mL). A negative result must be combined with clinical observations, patient history, and epidemiological information. The expected result is Negative.  Fact Sheet for  Patients:  PinkCheek.be  Fact Sheet for Healthcare Providers:  GravelBags.it  This test is no t yet approved or cleared by the [place] FDA and  has been authorized for detection and/or diagnosis of SARS-CoV-2 by FDA under an Emergency Use Authorization (EUA). This EUA will remain  in effect (meaning this test can be used) for the duration of the COVID-19 declaration under Section 564(b)(1) of the Act, 21 U.S.C. section 360bbb-3(b)(1), unless the authorization is terminated or revoked sooner.     Influenza A by PCR NEGATIVE NEGATIVE Final  Influenza B by PCR NEGATIVE NEGATIVE Final    Comment: (NOTE) The Xpert Xpress SARS-CoV-2/FLU/RSV assay is intended as an aid in  the diagnosis of influenza from Nasopharyngeal swab specimens and  should not be used as a sole basis for treatment. Nasal washings and  aspirates are unacceptable for Xpert Xpress SARS-CoV-2/FLU/RSV  testing.  Fact Sheet for Patients: PinkCheek.be  Fact Sheet for Healthcare Providers: GravelBags.it  This test is not yet approved or cleared by the [place] FDA and  has been authorized for detection and/or diagnosis of SARS-CoV-2 by  FDA under an Emergency Use Authorization (EUA). This EUA will remain  in effect (meaning this test can be used) for the duration of the  Covid-19 declaration under Section 564(b)(1) of the Act, 21  U.S.C. section 360bbb-3(b)(1), unless the authorization is  terminated or revoked. Performed at [facility], [address] [place], [place] [address]      Coagulation Studies: No results for input(s): LABPROT, INR in the last 72 hours.  Urinalysis: No results for input(s): COLORURINE, LABSPEC, PHURINE, GLUCOSEU, HGBUR, BILIRUBINUR, KETONESUR, PROTEINUR, UROBILINOGEN, NITRITE, LEUKOCYTESUR in the last 72 hours.  Invalid input(s): APPERANCEUR       Imaging: DG Chest Portable 1 View  Result Date: 04/09/2020 CLINICAL DATA:  Shortness of breath EXAM: PORTABLE CHEST 1 VIEW COMPARISON:  February 21, 2020 FINDINGS: There is cardiomegaly with pulmonary venous hypertension. There is mild interstitial edema. No consolidation. No adenopathy. No bone lesions. IMPRESSION: Cardiomegaly with pulmonary vascular congestion. Mild interstitial edema. Overall appearance is felt to be indicative of congestive heart failure. No consolidation. Electronically Signed   By: Lowella Grip III M.D.   On: 04/09/2020 13:59      Assessment & Plan: Pt is a 64 y.o.   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 Baseline Cr 0.85 /  GFR > 60 on 02/28/2020 Renal US from 02/21/20: cortical thinning, no hydronephrosis, suspected left nephrolithiasis Low BP noted this admission  09/29 0701 - 09/30 0700 In: 480 [P.O.:480] Out: 600 [Urine:600]  Lab Results  Component Value Date   CREATININE 2.55 (H) 04/11/2020   CREATININE 2.38 (H) 04/10/2020   CREATININE 2.23 (H) 04/09/2020   Plan Obtain U.A Repeat renal U.S Midodrine to treat hypotension Try to determine underlying cause of hypotension  Ch Sys CHF, acute exacerbation LVEF 50-55 % CXR shows congestion Aortic aneurysm (ascending) Once BP is higher (SBP > 120-130) will be able to use lasix infusion     LOS: 2 Ariyah Sedlack Candiss Norse 9/30/202111:32 AM    Note: This note was prepared with Dragon dictation. Any transcription errors are unintentional

## 2020-04-12 ENCOUNTER — Inpatient Hospital Stay: Payer: BC Managed Care – PPO

## 2020-04-12 LAB — BASIC METABOLIC PANEL
Anion gap: 8 (ref 5–15)
BUN: 68 mg/dL — ABNORMAL HIGH (ref 8–23)
CO2: 32 mmol/L (ref 22–32)
Calcium: 8 mg/dL — ABNORMAL LOW (ref 8.9–10.3)
Chloride: 100 mmol/L (ref 98–111)
Creatinine, Ser: 1.98 mg/dL — ABNORMAL HIGH (ref 0.44–1.00)
GFR calc Af Amer: 30 mL/min — ABNORMAL LOW (ref >60)
GFR calc non Af Amer: 26 mL/min — ABNORMAL LOW (ref >60)
Glucose, Bld: 94 mg/dL (ref 70–99)
Potassium: 4.6 mmol/L (ref 3.5–5.1)
Sodium: 140 mmol/L (ref 135–145)

## 2020-04-12 LAB — CBC WITH DIFFERENTIAL/PLATELET
Abs Immature Granulocytes: 0.06 10*3/uL (ref 0.00–0.07)
Basophils Absolute: 0 10*3/uL (ref 0.0–0.1)
Basophils Relative: 0 %
Eosinophils Absolute: 0.2 10*3/uL (ref 0.0–0.5)
Eosinophils Relative: 2 %
HCT: 35.7 % — ABNORMAL LOW (ref 36.0–46.0)
Hemoglobin: 10.8 g/dL — ABNORMAL LOW (ref 12.0–15.0)
Immature Granulocytes: 1 %
Lymphocytes Relative: 13 %
Lymphs Abs: 1.4 10*3/uL (ref 0.7–4.0)
MCH: 29 pg (ref 26.0–34.0)
MCHC: 30.3 g/dL (ref 30.0–36.0)
MCV: 95.7 fL (ref 80.0–100.0)
Monocytes Absolute: 1.1 10*3/uL — ABNORMAL HIGH (ref 0.1–1.0)
Monocytes Relative: 10 %
Neutro Abs: 8.3 10*3/uL — ABNORMAL HIGH (ref 1.7–7.7)
Neutrophils Relative %: 74 %
Platelets: 341 10*3/uL (ref 150–400)
RBC: 3.73 MIL/uL — ABNORMAL LOW (ref 3.87–5.11)
RDW: 21.4 % — ABNORMAL HIGH (ref 11.5–15.5)
Smear Review: NORMAL
WBC: 11.1 10*3/uL — ABNORMAL HIGH (ref 4.0–10.5)
nRBC: 0 % (ref 0.0–0.2)

## 2020-04-12 LAB — GLUCOSE, CAPILLARY
Glucose-Capillary: 93 mg/dL (ref 70–99)
Glucose-Capillary: 98 mg/dL (ref 70–99)
Glucose-Capillary: 123 mg/dL — ABNORMAL HIGH (ref 70–99)
Glucose-Capillary: 100 mg/dL — ABNORMAL HIGH (ref 70–99)

## 2020-04-12 LAB — BLOOD GAS, ARTERIAL
Acid-Base Excess: 12.1 mmol/L — ABNORMAL HIGH (ref 0.0–2.0)
Bicarbonate: 39.6 mmol/L — ABNORMAL HIGH (ref 20.0–28.0)
FIO2: 0.32
O2 Saturation: 94.6 %
Patient temperature: 37
pCO2 arterial: 67 mmHg (ref 32.0–48.0)
pH, Arterial: 7.38 (ref 7.350–7.450)
pO2, Arterial: 75 mmHg — ABNORMAL LOW (ref 83.0–108.0)

## 2020-04-12 MED ORDER — OXYCODONE HCL 5 MG PO TABS
5.0000 mg | ORAL_TABLET | Freq: Three times a day (TID) | ORAL | Status: DC | PRN
  Administered 2020-04-12: 5 mg via ORAL
  Filled 2020-04-12: qty 1

## 2020-04-12 MED ORDER — ACETAMINOPHEN 325 MG PO TABS
650.0000 mg | ORAL_TABLET | Freq: Four times a day (QID) | ORAL | Status: DC | PRN
  Administered 2020-04-13 – 2020-04-14 (×2): 650 mg via ORAL
  Filled 2020-04-12 (×2): qty 2

## 2020-04-12 MED ORDER — SODIUM CHLORIDE 0.9 % IV SOLN
INTRAVENOUS | Status: DC | PRN
  Administered 2020-04-12 – 2020-04-13 (×2): 250 mL via INTRAVENOUS

## 2020-04-12 MED ORDER — SODIUM CHLORIDE 0.9 % IV SOLN
1.0000 g | INTRAVENOUS | Status: DC
  Administered 2020-04-12 – 2020-04-13 (×2): 1 g via INTRAVENOUS
  Filled 2020-04-12 (×2): qty 10
  Filled 2020-04-12: qty 1

## 2020-04-12 NOTE — Progress Notes (Signed)
[facility], [place] 04/12/20  Subjective:   Hospital day # 3  Overall doing fair today Serum creatinine is lower Patient feels like her breathing is maybe a little better Continues to have some lower extremity edema [name] thinks that she may be a little confused  09/30 0701 - 10/01 0700 In: 840 [P.O.:840] Out: 1100 [Urine:1100] Lab Results  Component Value Date   CREATININE 1.98 (H) 04/12/2020   CREATININE 2.55 (H) 04/11/2020   CREATININE 2.38 (H) 04/10/2020     Objective:  Vital signs in last 24 hours:  Temp:  [98.4 F (36.9 C)-99.3 F (37.4 C)] 98.9 F (37.2 C) (10/01 1000) Pulse Rate:  [55-62] 62 (10/01 1000) Resp:  [13-27] 23 (10/01 1000) BP: (91-119)/(45-100) 117/73 (10/01 1000) SpO2:  [90 %-99 %] 92 % (10/01 1000) Weight:  [153 kg] 153 kg (10/01 0412)  Weight change: 1.179 kg Filed Weights   04/10/20 0400 04/11/20 0404 04/12/20 0412  Weight: (!) 152.9 kg (!) 151.9 kg (!) 153 kg    Intake/Output:    Intake/Output Summary (Last 24 hours) at 04/12/2020 1322 Last data filed at 04/12/2020 0414 Gross per 24 hour  Intake 360 ml  Output 1100 ml  Net -740 ml   Physical Exam: General:  No acute distress, laying in the bed  HEENT  anicteric, moist oral mucous membrane  Pulm/lungs  [place] O2, bilateral basilar crackles  CVS/Heart  regular rhythm, no rub or gallop  Abdomen:   Soft, nontender  Extremities: ++ peripheral edema with chronic skin changes  Neurologic:  Alert, oriented, able to follow commands, myoclonic jerks  Skin:  No acute rashes      Basic Metabolic Panel:  Recent Labs  Lab 04/09/20 1303 04/09/20 1303 04/10/20 0629 04/11/20 0359 04/12/20 0501  NA 137  --  140 141 140  K 4.7  --  5.0 4.6 4.6  CL 98  --  97* 99 100  CO2 28  --  32 30 32  GLUCOSE 100*  --  80 90 94  BUN 63*  --  63* 67* 68*  CREATININE 2.23*  --  2.38* 2.55* 1.98*  CALCIUM 8.4*   < > 8.3* 7.9* 8.0*  MG  --   --  2.4  --   --     < > = values in this interval not displayed.     CBC: Recent Labs  Lab 04/09/20 1303 04/10/20 0629 04/11/20 0359 04/12/20 0501  WBC 11.8* 12.6* 12.8* 11.1*  NEUTROABS 8.3*  --  9.8* 8.3*  HGB 12.2 10.8* 10.8* 10.8*  HCT 41.3 36.0 34.4* 35.7*  MCV 97.2 95.2 93.0 95.7  PLT 445* 373 343 341     No results found for: HEPBSAG, HEPBSAB, HEPBIGM    Microbiology:  Recent Results (from the past 240 hour(s))  Respiratory Panel by RT PCR (Flu A&B, Covid) - Nasopharyngeal Swab     Status: None   Collection Time: 04/09/20  1:17 PM   Specimen: Nasopharyngeal Swab  Result Value Ref Range Status   SARS Coronavirus 2 by RT PCR NEGATIVE NEGATIVE Final    Comment: (NOTE) SARS-CoV-2 target nucleic acids are NOT DETECTED.  The SARS-CoV-2 RNA is generally detectable in upper respiratoy specimens during the acute phase of infection. The lowest concentration of SARS-CoV-2 viral copies this assay can detect is 131 copies/mL. A negative result does not preclude SARS-Cov-2 infection and should not be used as the sole basis for treatment or other patient management decisions. A  negative result may occur with  improper specimen collection/handling, submission of specimen other than nasopharyngeal swab, presence of viral mutation(s) within the areas targeted by this assay, and inadequate number of viral copies (<131 copies/mL). A negative result must be combined with clinical observations, patient history, and epidemiological information. The expected result is Negative.  Fact Sheet for Patients:  PinkCheek.be  Fact Sheet for Healthcare Providers:  GravelBags.it  This test is no t yet approved or cleared by the [place] FDA and  has been authorized for detection and/or diagnosis of SARS-CoV-2 by FDA under an Emergency Use Authorization (EUA). This EUA will remain  in effect (meaning this test can be used) for the duration of  the COVID-19 declaration under Section 564(b)(1) of the Act, 21 U.S.C. section 360bbb-3(b)(1), unless the authorization is terminated or revoked sooner.     Influenza A by PCR NEGATIVE NEGATIVE Final   Influenza B by PCR NEGATIVE NEGATIVE Final    Comment: (NOTE) The Xpert Xpress SARS-CoV-2/FLU/RSV assay is intended as an aid in  the diagnosis of influenza from Nasopharyngeal swab specimens and  should not be used as a sole basis for treatment. Nasal washings and  aspirates are unacceptable for Xpert Xpress SARS-CoV-2/FLU/RSV  testing.  Fact Sheet for Patients: PinkCheek.be  Fact Sheet for Healthcare Providers: GravelBags.it  This test is not yet approved or cleared by the [place] FDA and  has been authorized for detection and/or diagnosis of SARS-CoV-2 by  FDA under an Emergency Use Authorization (EUA). This EUA will remain  in effect (meaning this test can be used) for the duration of the  Covid-19 declaration under Section 564(b)(1) of the Act, 21  U.S.C. section 360bbb-3(b)(1), unless the authorization is  terminated or revoked. Performed at [facility], [address] [place],  [address]     Coagulation Studies: No results for input(s): LABPROT, INR in the last 72 hours.  Urinalysis: Recent Labs    04/11/20 1650  COLORURINE YELLOW*  LABSPEC 1.011  PHURINE 5.0  GLUCOSEU NEGATIVE  HGBUR MODERATE*  BILIRUBINUR NEGATIVE  KETONESUR NEGATIVE  PROTEINUR NEGATIVE  NITRITE POSITIVE*  LEUKOCYTESUR LARGE*      Imaging: US RENAL  Result Date: 04/11/2020 CLINICAL DATA:  Acute renal failure EXAM: RENAL / URINARY TRACT ULTRASOUND COMPLETE COMPARISON:  Renal ultrasound 10/22/2019 FINDINGS: Right Kidney: Renal measurements: 10.8 x 6.6 x 6.2 cm = volume: 233 mL. Echogenicity is within normal limits. Mild cortical thinning and renal sinus lipomatosis. Simple appearing 1.4 x 1.3 x 1.3 cm cyst  arising from the upper pole right kidney. No concerning renal mass, shadowing calculus or hydronephrosis. Left Kidney: Renal measurements: 13.5 x 7.2 x 6.4 cm = volume: 323.4 mL. Echogenicity is within normal limits. Mild cortical thinning and renal sinus lipomatosis. No concerning renal mass, shadowing calculus or hydronephrosis. Bladder: Appears normal for degree of bladder distention. Other: None. IMPRESSION: Kidneys appear slightly enlarged bilaterally (upper limits normal for females 150 mL). With some chronic renal cortical thinning and renal sinus lipomatosis, can reflect a degree of chronic renal failure or diabetic nephropathy but with preservation of the parenchymal echogenicity arguing against a more acute medical renal disease. Simple 1.4 cm cyst in the upper pole right kidney. Electronically Signed   By: Lovena Le M.D.   On: 04/11/2020 19:19   DG Chest Port 1 View  Result Date: 04/12/2020 CLINICAL DATA:  Short of breath.  Cough EXAM: PORTABLE CHEST 1 VIEW COMPARISON:  04/01/2020 FINDINGS: Enlarged cardiac silhouette with ectatic aorta.  There is patchy bilateral airspace disease. No focal consolidation. No pneumothorax. Mild improvement in airspace opacities. IMPRESSION: Patchy bilateral airspace disease suggests pulmonary edema. Mild improvement from comparison exam. Electronically Signed   By: Suzy Bouchard M.D.   On: 04/12/2020 10:40     Medications:   . sodium chloride     . aspirin EC  81 mg Oral Daily  . atorvastatin  20 mg Oral Q2200  . buPROPion  150 mg Oral Daily  . cholecalciferol  1,000 Units Oral Daily  . enoxaparin (LOVENOX) injection  40 mg Subcutaneous Q12H  . gabapentin  300 mg Oral TID  . insulin aspart  0-5 Units Subcutaneous QHS  . insulin aspart  0-9 Units Subcutaneous TID WC  . levothyroxine  250 mcg Oral Q0600  . midodrine  10 mg Oral TID WC  . multivitamin with minerals  1 tablet Oral Daily  . rOPINIRole  3 mg Oral TID  . sodium chloride flush  3 mL  Intravenous Q12H  . zinc sulfate  220 mg Oral Daily   sodium chloride, acetaminophen, albuterol, dextromethorphan-guaiFENesin, hydrALAZINE, hydrOXYzine, oxyCODONE, sodium chloride flush  Assessment/ Plan:  64 y.o. 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  Will discuss with hospitalist Renal U.S-chronic renal cortical thinning Midodrine to treat hypotension Try to determine underlying cause of hypotension  Ch Sys CHF, acute exacerbation LVEF 50-55 % CXR shows congestion Aortic aneurysm (ascending) Patient requesting COVID-19 booster  Urinary tract infection Urine culture is pending  Myoclonic jerks Possibly reduced clearance due to AKI Consider reducing dose of gabapentin and ropinirole    LOS: [place] 10/1/20211:22 PM  [place], [place]  Note: This note was prepared with Dragon dictation. Any transcription errors are unintentional

## 2020-04-12 NOTE — Progress Notes (Signed)
Pts RR can be up with activity. RR checked for a full minute. MEWS is yellow but this is not an acute change. I will continue to assess.

## 2020-04-12 NOTE — Plan of Care (Signed)
  Problem: Activity: Goal: Capacity to carry out activities will improve Outcome: Not Progressing  Generalized weakness with max assist to move in bed.

## 2020-04-12 NOTE — Progress Notes (Signed)
Progress Note    Madison Mcbride  ACZ:[id number] DOB: January 30, 1956  DOA: 04/09/2020 PCP: Rusty Aus, MD      Brief Narrative:    Medical records reviewed and are as summarized below:  Madison Mcbride is a 64 y.o. 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  (Morbid obesity)   PLAN  Creatinine and urine output are improving.  Continue to hold Lasix for now. Continue 4 L/min oxygen via nasal cannula for respiratory failure.  Taper off as able. Continue BiPAP as needed. Obtain urine culture because of abnormal urinalysis.  Doubt UTI at this time. Continue midodrine for low blood pressure. Repeat chest x-ray done today showed pulmonary edema but with some improvement. Analgesics as needed for pain. Follow-up with cardiologist, nephrologist and pulmonologist. PT recommends discharge to SNF. Plan discussed with patient and her husband at the bedside.   Diet Order            Diet 2 gram sodium Room service appropriate? Yes; Fluid consistency: Thin  Diet effective now                    Consultants:  Cardiologist  Pulmonologist  Nephrologist  Procedures:  None    Medications:   . aspirin EC  81 mg Oral Daily  . atorvastatin  20 mg Oral Q2200  . buPROPion  150 mg Oral Daily  . cholecalciferol  1,000  Units Oral Daily  . enoxaparin (LOVENOX) injection  40 mg Subcutaneous Q12H  . gabapentin  300 mg Oral TID  . insulin aspart  0-5 Units Subcutaneous QHS  . insulin aspart  0-9 Units Subcutaneous TID WC  . levothyroxine  250 mcg Oral Q0600  . midodrine  10 mg Oral TID WC  . multivitamin with minerals  1 tablet Oral Daily  . rOPINIRole  3 mg Oral TID  . sodium chloride flush  3 mL Intravenous Q12H  . zinc sulfate  220 mg Oral Daily   Continuous Infusions: . sodium chloride       Anti-infectives (From admission, onward)   None             Family Communication/Anticipated D/C date and plan/Code Status   DVT prophylaxis: enoxaparin (LOVENOX) injection 40 mg Start: 04/09/20 2200     Code Status: Full Code  Family Communication: Plan discussed with husband at bedside Disposition Plan:    Status is: Inpatient  Remains inpatient appropriate because:IV treatments appropriate due to intensity of illness or inability to take PO and Inpatient level of care appropriate due to severity of illness   Dispo: The patient is from: Home              Anticipated d/c is to: Home              Anticipated d/c date is: 3 days  Patient currently is not medically stable to d/c.           Subjective:   Interval events noted.  She did not require BiPAP overnight.  She complains of cough.  No shortness of breath or chest pain.  She did not tell me about her back pain but she had reported to the nurse that she was having back pain not relieved by Tylenol.  She has chronic back pain.  He has been also reports worsening tremors in the left upper extremity.  She has tremors at baseline but apparently has worsened.    Objective:    Vitals:   04/12/20 0700 04/12/20 0800 04/12/20 0805 04/12/20 1000  BP:  (!) 119/100 (!) 119/100 117/73  Pulse:  (!) 58 (!) 58 62  Resp:  (!) 27 20 (!) 23  Temp: 98.9 F (37.2 C) 98.9 F (37.2 C) 98.9 F (37.2 C) 98.9 F (37.2 C)    TempSrc: Oral   Oral  SpO2:  99%  92%  Weight:      Height:       No data found.   Intake/Output Summary (Last 24 hours) at 04/12/2020 1152 Last data filed at 04/12/2020 0414 Gross per 24 hour  Intake 360 ml  Output 1100 ml  Net -740 ml   Filed Weights   04/10/20 0400 04/11/20 0404 04/12/20 0412  Weight: (!) 152.9 kg (!) 151.9 kg (!) 153 kg    Exam:   GEN: NAD SKIN: No rash EYES: EOMI ENT: MMM CV: RRR PULM: CTA B ABD: soft, obese, NT, +BS CNS: AAO x 3, non focal EXT: B/l leg edema with erythema, no tenderness     Data Reviewed:   I have personally reviewed following labs and imaging studies:  Labs: Labs show the following:   Basic Metabolic Panel: Recent Labs  Lab 04/09/20 1303 04/09/20 1303 04/10/20 0629 04/10/20 0629 04/11/20 0359 04/12/20 0501  NA 137  --  140  --  141 140  K 4.7   < > 5.0   < > 4.6 4.6  CL 98  --  97*  --  99 100  CO2 28  --  32  --  30 32  GLUCOSE 100*  --  80  --  90 94  BUN 63*  --  63*  --  67* 68*  CREATININE 2.23*  --  2.38*  --  2.55* 1.98*  CALCIUM 8.4*  --  8.3*  --  7.9* 8.0*  MG  --   --  2.4  --   --   --    < > = values in this interval not displayed.   GFR Estimated Creatinine Clearance: 42.1 mL/min (A) (by C-G formula based on SCr of 1.98 mg/dL (H)). Liver Function Tests: No results for input(s): AST, ALT, ALKPHOS, BILITOT, PROT, ALBUMIN in the last 168 hours. No results for input(s): LIPASE, AMYLASE in the last 168 hours. No results for input(s): AMMONIA in the last 168 hours. Coagulation profile No results for input(s): INR, PROTIME in the last 168 hours.  CBC: Recent Labs  Lab 04/09/20 1303 04/10/20 0629 04/11/20 0359 04/12/20 0501  WBC 11.8* 12.6* 12.8* 11.1*  NEUTROABS 8.3*  --  9.8* 8.3*  HGB 12.2 10.8* 10.8* 10.8*  HCT 41.3 36.0 34.4* 35.7*  MCV 97.2 95.2 93.0 95.7  PLT 445* 373 343 341   Cardiac Enzymes: No results for input(s): CKTOTAL, CKMB, CKMBINDEX, TROPONINI in the last 168  hours. BNP (last 3  results) No results for input(s): PROBNP in the last 8760 hours. CBG: Recent Labs  Lab 04-28-2020 0739 04-28-20 1142 04-28-20 1644 2020/04/28 2111 04/12/20 0809  GLUCAP 84 110* 112* 107* 93   D-Dimer: No results for input(s): DDIMER in the last 72 hours. Hgb A1c: Recent Labs    04/09/20 1641  HGBA1C 5.8*   Lipid Profile: Recent Labs    04/10/20 0629  CHOL 91  HDL 13*  LDLCALC 56  TRIG 108  CHOLHDL 7.0   Thyroid function studies: Recent Labs    04/09/20 1303  TSH 0.738   Anemia work up: No results for input(s): VITAMINB12, FOLATE, FERRITIN, TIBC, IRON, RETICCTPCT in the last 72 hours. Sepsis Labs: Recent Labs  Lab 04/09/20 1303 04/10/20 0629 04-28-20 0359 04/12/20 0501  WBC 11.8* 12.6* 12.8* 11.1*    Microbiology Recent Results (from the past 240 hour(s))  Respiratory Panel by RT PCR (Flu A&B, Covid) - Nasopharyngeal Swab     Status: None   Collection Time: 04/09/20  1:17 PM   Specimen: Nasopharyngeal Swab  Result Value Ref Range Status   SARS Coronavirus 2 by RT PCR NEGATIVE NEGATIVE Final    Comment: (NOTE) SARS-CoV-2 target nucleic acids are NOT DETECTED.  The SARS-CoV-2 RNA is generally detectable in upper respiratoy specimens during the acute phase of infection. The lowest concentration of SARS-CoV-2 viral copies this assay can detect is 131 copies/mL. A negative result does not preclude SARS-Cov-2 infection and should not be used as the sole basis for treatment or other patient management decisions. A negative result may occur with  improper specimen collection/handling, submission of specimen other than nasopharyngeal swab, presence of viral mutation(s) within the areas targeted by this assay, and inadequate number of viral copies (<131 copies/mL). A negative result must be combined with clinical observations, patient history, and epidemiological information. The expected result is Negative.  Fact Sheet for Patients:   PinkCheek.be  Fact Sheet for Healthcare Providers:  GravelBags.it  This test is no t yet approved or cleared by the [place] FDA and  has been authorized for detection and/or diagnosis of SARS-CoV-2 by FDA under an Emergency Use Authorization (EUA). This EUA will remain  in effect (meaning this test can be used) for the duration of the COVID-19 declaration under Section 564(b)(1) of the Act, 21 U.S.C. section 360bbb-3(b)(1), unless the authorization is terminated or revoked sooner.     Influenza A by PCR NEGATIVE NEGATIVE Final   Influenza B by PCR NEGATIVE NEGATIVE Final    Comment: (NOTE) The Xpert Xpress SARS-CoV-2/FLU/RSV assay is intended as an aid in  the diagnosis of influenza from Nasopharyngeal swab specimens and  should not be used as a sole basis for treatment. Nasal washings and  aspirates are unacceptable for Xpert Xpress SARS-CoV-2/FLU/RSV  testing.  Fact Sheet for Patients: PinkCheek.be  Fact Sheet for Healthcare Providers: GravelBags.it  This test is not yet approved or cleared by the [place] FDA and  has been authorized for detection and/or diagnosis of SARS-CoV-2 by  FDA under an Emergency Use Authorization (EUA). This EUA will remain  in effect (meaning this test can be used) for the duration of the  Covid-19 declaration under Section 564(b)(1) of the Act, 21  U.S.C. section 360bbb-3(b)(1), unless the authorization is  terminated or revoked. Performed at [facility], [address] [place], [place] [address]     Procedures and diagnostic studies:  US RENAL  Result Date: 04/28/20 CLINICAL DATA:  Acute renal failure EXAM: RENAL /  URINARY TRACT ULTRASOUND COMPLETE COMPARISON:  Renal ultrasound 10/22/2019 FINDINGS: Right Kidney: Renal measurements: 10.8 x 6.6 x 6.2 cm = volume: 233 mL. Echogenicity is within normal  limits. Mild cortical thinning and renal sinus lipomatosis. Simple appearing 1.4 x 1.3 x 1.3 cm cyst arising from the upper pole right kidney. No concerning renal mass, shadowing calculus or hydronephrosis. Left Kidney: Renal measurements: 13.5 x 7.2 x 6.4 cm = volume: 323.4 mL. Echogenicity is within normal limits. Mild cortical thinning and renal sinus lipomatosis. No concerning renal mass, shadowing calculus or hydronephrosis. Bladder: Appears normal for degree of bladder distention. Other: None. IMPRESSION: Kidneys appear slightly enlarged bilaterally (upper limits normal for females 150 mL). With some chronic renal cortical thinning and renal sinus lipomatosis, can reflect a degree of chronic renal failure or diabetic nephropathy but with preservation of the parenchymal echogenicity arguing against a more acute medical renal disease. Simple 1.4 cm cyst in the upper pole right kidney. Electronically Signed   By: Lovena Le M.D.   On: 04/11/2020 19:19   DG Chest Port 1 View  Result Date: 04/12/2020 CLINICAL DATA:  Short of breath.  Cough EXAM: PORTABLE CHEST 1 VIEW COMPARISON:  04/01/2020 FINDINGS: Enlarged cardiac silhouette with ectatic aorta. There is patchy bilateral airspace disease. No focal consolidation. No pneumothorax. Mild improvement in airspace opacities. IMPRESSION: Patchy bilateral airspace disease suggests pulmonary edema. Mild improvement from comparison exam. Electronically Signed   By: Suzy Bouchard M.D.   On: 04/12/2020 10:40               LOS: 3 days   Linnette Panella  [facility]   Pager on [email or web address]. If 7PM-7AM, please contact night-coverage at [email or web address]     04/12/2020, 11:52 AM

## 2020-04-12 NOTE — Progress Notes (Signed)
Pt is lethargic and difficult to arouse. MD notified. Orders for ABG given. I will continue to assess.

## 2020-04-12 NOTE — Evaluation (Signed)
Physical Therapy Evaluation Patient Details Name: Madison Mcbride MRN: [id number] DOB: 10/08/1955 Today's Date: 04/12/2020   History of Present Illness  64 y.o. female presented to ED wiht shortness of breath. Pt admitted with acute on chronic diastolic dysfunction congestive heart failure multifactorial in nature including acute on chronic kidney dysfunction and PMHx of CKD3, DM, HTN, HLD, and sleep apnea without evidence of myocardial infarction.   Clinical Impression  Pt was recently in the hospital and discharged to rehab.  Apparently she was on isolation at rehab and did not do a lot of walking, but was able to do some loops in room with PT and with assist was getting to the bathroom and back.  She is very weak and limited today and showed consistent tremors and jerking in all limbs as well as weak and halting speech.  She needed total assist getting to/from sitting EOB and even with +2 heavy assist and much cuing she was unable top get hips off the bed even 1 inch on multiple attempts.  She is on 4L O2 (no O2 at baseline) and though her sats generally remained in the 90s (occasional drops into the upper/mid 80s but quickly back to 90s with focused breathing).  Pt, however, reported feeling very weak and agrees that she is in no way safe/able to go home at this point.  PT can only recommend STR once medically ready for discharge she is simply to weak and functionally limited to be able to safely go home.     Follow Up Recommendations SNF    Equipment Recommendations  None recommended by PT    Recommendations for Other Services       Precautions / Restrictions Precautions Precautions: Fall Restrictions Weight Bearing Restrictions: No      Mobility  Bed Mobility Overal bed mobility: Needs Assistance Bed Mobility: Supine to Sit;Sit to Supine     Supine to sit: Max assist;+2 for physical assistance Sit to supine: Max assist;+2 for physical assistance   General bed mobility  comments: Pt essentially unable to help, heavy assist the entire time trying to attain sitting  Transfers Overall transfer level: Needs assistance Equipment used: Rolling walker (2 wheeled);Left platform walker Transfers: Sit to/from Stand Sit to Stand: Total assist         General transfer comment: Even with heavy assist pt was unable to elevate hips even an inch off the bed despite much cuing, set up and pt counting down herself and using rocking momentum to try and assist (as she apparently does at home)  Ambulation/Gait                Stairs            Wheelchair Mobility    Modified Rankin (Stroke Patients Only)       Balance Overall balance assessment: Needs assistance Sitting-balance support: Feet supported;Bilateral upper extremity supported;Single extremity supported Sitting balance-Leahy Scale: Fair         Standing balance comment: unable to attain standing                             Pertinent Vitals/Pain Pain Assessment: 0-10 Pain Score: 6  Pain Location: mid/low back, L>R    Home Living Family/patient expects to be discharged to:: Skilled nursing facility Living Arrangements: Spouse/significant other Available Help at Discharge: Family;Friend(s);Available 24 hours/day;Other (Comment) Type of Home: House Home Access: Stairs to enter Entrance Stairs-Rails: Right;Left;Can reach both Entrance [employer]  of Steps: 5 Home Layout: One level Home Equipment: [place] - 4 wheels;Shower seat;Grab bars - toilet;Grab bars - tub/shower;Walker - 2 wheels;Bedside commode;Wheelchair - manual      Prior Function Level of Independence: Needs assistance   Gait / Transfers Assistance Needed: since returning home from rehab after Aug 21 hospital admission, pt ambulating short [place] distances wiht 2WW and using W/C for longer HH distance and outside the home; assist for transfers           Hand Dominance        Extremity/Trunk Assessment    Upper Extremity Assessment Upper Extremity Assessment: Generalized weakness (L CTS/dec grip, R shoulder OA crepitus, grossly 3-/5 t/o)    Lower Extremity Assessment Lower Extremity Assessment: Generalized weakness (limited AROM b/l, LE hip ROM limited due to habitus)       Communication   Communication: No difficulties (weak, labored, "cracking" non-baseline voice)  Cognition Arousal/Alertness: Awake/alert   Overall Cognitive Status: Impaired/Different from baseline                                 General Comments: per husband pt has been more confused and slow to respond in the last few weeks      General Comments General comments (skin integrity, edema, etc.): Pt with consistent tremors and muscle jerks that are apparently relatively new and much more pronounced than even the last week    Exercises     Assessment/Plan    PT Assessment Patient needs continued PT services  PT Problem List Decreased strength;Decreased range of motion;Decreased activity tolerance;Decreased balance;Decreased mobility;Decreased coordination;Decreased cognition;Decreased knowledge of use of DME;Decreased safety awareness;Pain       PT Treatment Interventions DME instruction;Gait training;Functional mobility training;Therapeutic exercise;Therapeutic activities;Balance training;Cognitive remediation;Patient/family education    PT Goals (Current goals can be found in the Care Plan section)  Acute Rehab PT Goals Patient Stated Goal: get better and go home PT Goal Formulation: With patient/family Time For Goal Achievement: 04/26/20 Potential to Achieve Goals: Fair    Frequency Min 2X/week   Barriers to discharge        Co-evaluation               AM-PAC PT "6 Clicks" Mobility  Outcome Measure Help needed turning from your back to your side while in a flat bed without using bedrails?: Total Help needed moving from lying on your back to sitting on the side of a flat bed  without using bedrails?: Total Help needed moving to and from a bed to a chair (including a wheelchair)?: Total Help needed standing up from a chair using your arms (e.g., wheelchair or bedside chair)?: Total Help needed to walk in hospital room?: Total Help needed climbing 3-5 steps with a railing? : Total 6 Click Score: 6    End of Session Equipment Utilized During Treatment: Gait belt;Oxygen (4L) Activity Tolerance: Patient limited by pain;Patient limited by fatigue Patient left: in bed;with call bell/phone within reach (x-ray in room) Nurse Communication: Mobility status PT Visit Diagnosis: Muscle weakness (generalized) (M62.81);Difficulty in walking, not elsewhere classified (R26.2);Pain Pain - part of body:  (lumbago)    Time: 9798-9211 PT Time Calculation (min) (ACUTE ONLY): 47 min   Charges:   PT Evaluation $PT Eval Moderate Complexity: 1 Mod PT Treatments $Therapeutic Exercise: 8-22 mins $Therapeutic Activity: 8-22 mins        Kreg Shropshire, DPT 04/12/2020, 1:04 PM

## 2020-04-13 ENCOUNTER — Inpatient Hospital Stay: Payer: BC Managed Care – PPO

## 2020-04-13 DIAGNOSIS — G4733 Obstructive sleep apnea (adult) (pediatric): Secondary | ICD-10-CM

## 2020-04-13 LAB — BLOOD GAS, ARTERIAL
Acid-Base Excess: 9.4 mmol/L — ABNORMAL HIGH (ref 0.0–2.0)
Allens test (pass/fail): POSITIVE — AB
Bicarbonate: 38 mmol/L — ABNORMAL HIGH (ref 20.0–28.0)
FIO2: 36
O2 Saturation: 95.2 %
Patient temperature: 37
pCO2 arterial: 72 mmHg (ref 32.0–48.0)
pH, Arterial: 7.33 — ABNORMAL LOW (ref 7.350–7.450)
pO2, Arterial: 82 mmHg — ABNORMAL LOW (ref 83.0–108.0)

## 2020-04-13 LAB — CBC WITH DIFFERENTIAL/PLATELET
Abs Immature Granulocytes: 0.03 10*3/uL (ref 0.00–0.07)
Basophils Absolute: 0 10*3/uL (ref 0.0–0.1)
Basophils Relative: 0 %
Eosinophils Absolute: 0.2 10*3/uL (ref 0.0–0.5)
Eosinophils Relative: 2 %
HCT: 37.6 % (ref 36.0–46.0)
Hemoglobin: 10.9 g/dL — ABNORMAL LOW (ref 12.0–15.0)
Immature Granulocytes: 0 %
Lymphocytes Relative: 14 %
Lymphs Abs: 1.5 10*3/uL (ref 0.7–4.0)
MCH: 28.5 pg (ref 26.0–34.0)
MCHC: 29 g/dL — ABNORMAL LOW (ref 30.0–36.0)
MCV: 98.2 fL (ref 80.0–100.0)
Monocytes Absolute: 0.9 10*3/uL (ref 0.1–1.0)
Monocytes Relative: 9 %
Neutro Abs: 7.8 10*3/uL — ABNORMAL HIGH (ref 1.7–7.7)
Neutrophils Relative %: 75 %
Platelets: 354 10*3/uL (ref 150–400)
RBC: 3.83 MIL/uL — ABNORMAL LOW (ref 3.87–5.11)
RDW: 21.2 % — ABNORMAL HIGH (ref 11.5–15.5)
Smear Review: NORMAL
WBC: 10.5 10*3/uL (ref 4.0–10.5)
nRBC: 0 % (ref 0.0–0.2)

## 2020-04-13 LAB — BASIC METABOLIC PANEL
Anion gap: 8 (ref 5–15)
BUN: 55 mg/dL — ABNORMAL HIGH (ref 8–23)
CO2: 32 mmol/L (ref 22–32)
Calcium: 9 mg/dL (ref 8.9–10.3)
Chloride: 99 mmol/L (ref 98–111)
Creatinine, Ser: 1.45 mg/dL — ABNORMAL HIGH (ref 0.44–1.00)
GFR calc Af Amer: 43 mL/min — ABNORMAL LOW (ref >60)
GFR calc non Af Amer: 37 mL/min — ABNORMAL LOW (ref >60)
Glucose, Bld: 92 mg/dL (ref 70–99)
Potassium: 4.6 mmol/L (ref 3.5–5.1)
Sodium: 139 mmol/L (ref 135–145)

## 2020-04-13 LAB — GLUCOSE, CAPILLARY
Glucose-Capillary: 86 mg/dL (ref 70–99)
Glucose-Capillary: 121 mg/dL — ABNORMAL HIGH (ref 70–99)
Glucose-Capillary: 118 mg/dL — ABNORMAL HIGH (ref 70–99)
Glucose-Capillary: 104 mg/dL — ABNORMAL HIGH (ref 70–99)

## 2020-04-13 LAB — FIBRIN DERIVATIVES D-DIMER (ARMC ONLY): Fibrin derivatives D-dimer (ARMC): 5333.49 ng/mL (FEU) — ABNORMAL HIGH (ref 0.00–499.00)

## 2020-04-13 MED ORDER — ROPINIROLE HCL 1 MG PO TABS
2.0000 mg | ORAL_TABLET | Freq: Three times a day (TID) | ORAL | Status: DC
  Administered 2020-04-13 – 2020-04-18 (×14): 2 mg via ORAL
  Filled 2020-04-13 (×14): qty 2

## 2020-04-13 MED ORDER — GABAPENTIN 300 MG PO CAPS
300.0000 mg | ORAL_CAPSULE | Freq: Every day | ORAL | Status: DC
  Administered 2020-04-14 – 2020-04-15 (×2): 300 mg via ORAL
  Filled 2020-04-13 (×2): qty 1

## 2020-04-13 NOTE — Progress Notes (Signed)
°   04/12/20 2155  Assess: MEWS Score  BP 117/60  Pulse Rate (!) 58  ECG Heart Rate (!) 58  Resp (!) 21  Level of Consciousness Responds to Voice  SpO2 96 %  O2 Device Bi-PAP  Patient Activity (if Appropriate) In bed  FiO2 (%) 30 %  Assess: MEWS Score  MEWS Temp 0  MEWS Systolic 0  MEWS Pulse 0  MEWS RR 1  MEWS LOC 1  MEWS Score 2  MEWS Score Color Yellow  Assess: if the MEWS score is Yellow or Red  Were vital signs taken at a resting state? Yes  Focused Assessment No change from prior assessment  Early Detection of Sepsis Score *See Row Information* Low  MEWS guidelines implemented *See Row Information* No, vital signs rechecked  Treat  MEWS Interventions Other (Comment)

## 2020-04-13 NOTE — Progress Notes (Signed)
[place], [place] 04/13/20  Subjective:   Hospital day # 4  Renal function improved as creatinine down to 1.45. Resting comfortably in bed.  10/01 0701 - 10/02 0700 In: 240 [P.O.:240] Out: 1400 [Urine:1400] Lab Results  Component Value Date   CREATININE 1.45 (H) 04/13/2020   CREATININE 1.98 (H) 04/12/2020   CREATININE 2.55 (H) 04/11/2020     Objective:  Vital signs in last 24 hours:  Temp:  [98.5 F (36.9 C)-99.5 F (37.5 C)] 99.5 F (37.5 C) (10/02 1211) Pulse Rate:  [55-66] 66 (10/02 1211) Resp:  [16-24] 20 (10/02 1211) BP: (105-144)/(50-60) 125/56 (10/02 1211) SpO2:  [93 %-97 %] 95 % (10/02 1211) FiO2 (%):  [30 %] 30 % (10/01 2300) Weight:  [151.6 kg] 151.6 kg (10/02 0427)  Weight change: -1.444 kg Filed Weights   04/11/20 0404 04/12/20 0412 04/13/20 0427  Weight: (!) 151.9 kg (!) 153 kg (!) 151.6 kg    Intake/Output:    Intake/Output Summary (Last 24 hours) at 04/13/2020 1441 Last data filed at 04/13/2020 0208 Gross per 24 hour  Intake --  Output 1400 ml  Net -1400 ml   Physical Exam: General:  No acute distress, laying in the bed  HEENT  anicteric, moist oral mucous membrane  Pulm/lungs  [place] O2, bilateral basilar crackles  CVS/Heart  regular rhythm, no rub or gallop  Abdomen:   Soft, nontender  Extremities: ++ peripheral edema with chronic skin changes  Neurologic:  Alert, oriented, able to follow commands, myoclonic jerks  Skin:  No acute rashes      Basic Metabolic Panel:  Recent Labs  Lab 04/09/20 1303 04/09/20 1303 04/10/20 0629 04/10/20 0629 04/11/20 0359 04/12/20 0501 04/13/20 0738  NA 137  --  140  --  141 140 139  K 4.7  --  5.0  --  4.6 4.6 4.6  CL 98  --  97*  --  99 100 99  CO2 28  --  32  --  30 32 32  GLUCOSE 100*  --  80  --  90 94 92  BUN 63*  --  63*  --  67* 68* 55*  CREATININE 2.23*  --  2.38*  --  2.55* 1.98* 1.45*  CALCIUM 8.4*   < > 8.3*   < > 7.9* 8.0* 9.0  MG  --   --  2.4  --   --    --   --    < > = values in this interval not displayed.     CBC: Recent Labs  Lab 04/09/20 1303 04/10/20 0629 04/11/20 0359 04/12/20 0501 04/13/20 0738  WBC 11.8* 12.6* 12.8* 11.1* 10.5  NEUTROABS 8.3*  --  9.8* 8.3* 7.8*  HGB 12.2 10.8* 10.8* 10.8* 10.9*  HCT 41.3 36.0 34.4* 35.7* 37.6  MCV 97.2 95.2 93.0 95.7 98.2  PLT 445* 373 343 341 354     No results found for: HEPBSAG, HEPBSAB, HEPBIGM    Microbiology:  Recent Results (from the past 240 hour(s))  Respiratory Panel by RT PCR (Flu A&B, Covid) - Nasopharyngeal Swab     Status: None   Collection Time: 04/09/20  1:17 PM   Specimen: Nasopharyngeal Swab  Result Value Ref Range Status   SARS Coronavirus 2 by RT PCR NEGATIVE NEGATIVE Final    Comment: (NOTE) SARS-CoV-2 target nucleic acids are NOT DETECTED.  The SARS-CoV-2 RNA is generally detectable in upper respiratoy specimens during the acute phase of infection. The lowest concentration  of SARS-CoV-2 viral copies this assay can detect is 131 copies/mL. A negative result does not preclude SARS-Cov-2 infection and should not be used as the sole basis for treatment or other patient management decisions. A negative result may occur with  improper specimen collection/handling, submission of specimen other than nasopharyngeal swab, presence of viral mutation(s) within the areas targeted by this assay, and inadequate number of viral copies (<131 copies/mL). A negative result must be combined with clinical observations, patient history, and epidemiological information. The expected result is Negative.  Fact Sheet for Patients:  PinkCheek.be  Fact Sheet for Healthcare Providers:  GravelBags.it  This test is no t yet approved or cleared by the [place] FDA and  has been authorized for detection and/or diagnosis of SARS-CoV-2 by FDA under an Emergency Use Authorization (EUA). This EUA will remain  in  effect (meaning this test can be used) for the duration of the COVID-19 declaration under Section 564(b)(1) of the Act, 21 U.S.C. section 360bbb-3(b)(1), unless the authorization is terminated or revoked sooner.     Influenza A by PCR NEGATIVE NEGATIVE Final   Influenza B by PCR NEGATIVE NEGATIVE Final    Comment: (NOTE) The Xpert Xpress SARS-CoV-2/FLU/RSV assay is intended as an aid in  the diagnosis of influenza from Nasopharyngeal swab specimens and  should not be used as a sole basis for treatment. Nasal washings and  aspirates are unacceptable for Xpert Xpress SARS-CoV-2/FLU/RSV  testing.  Fact Sheet for Patients: PinkCheek.be  Fact Sheet for Healthcare Providers: GravelBags.it  This test is not yet approved or cleared by the [place] FDA and  has been authorized for detection and/or diagnosis of SARS-CoV-2 by  FDA under an Emergency Use Authorization (EUA). This EUA will remain  in effect (meaning this test can be used) for the duration of the  Covid-19 declaration under Section 564(b)(1) of the Act, 21  U.S.C. section 360bbb-3(b)(1), unless the authorization is  terminated or revoked. Performed at [facility], [address], [place], [place] [address]   Urine Culture     Status: Abnormal (Preliminary result)   Collection Time: 04/11/20  4:50 PM   Specimen: Urine, Random  Result Value Ref Range Status   Specimen Description   Final    URINE, RANDOM Performed at [facility], [address], [place], [place] [address]    Special Requests   Final    NONE Performed at [facility], [address] [place], [place] [address]    Culture >=100,000 COLONIES/mL ESCHERICHIA COLI (A)  Final   Report Status PENDING  Incomplete    Coagulation Studies: No results for input(s): LABPROT, INR in the last 72 hours.  Urinalysis: Recent Labs    04/11/20 1650  COLORURINE YELLOW*   LABSPEC 1.011  PHURINE 5.0  GLUCOSEU NEGATIVE  HGBUR MODERATE*  BILIRUBINUR NEGATIVE  KETONESUR NEGATIVE  PROTEINUR NEGATIVE  NITRITE POSITIVE*  LEUKOCYTESUR LARGE*      Imaging: US RENAL  Result Date: 04/11/2020 CLINICAL DATA:  Acute renal failure EXAM: RENAL / URINARY TRACT ULTRASOUND COMPLETE COMPARISON:  Renal ultrasound 10/22/2019 FINDINGS: Right Kidney: Renal measurements: 10.8 x 6.6 x 6.2 cm = volume: 233 mL. Echogenicity is within normal limits. Mild cortical thinning and renal sinus lipomatosis. Simple appearing 1.4 x 1.3 x 1.3 cm cyst arising from the upper pole right kidney. No concerning renal mass, shadowing calculus or hydronephrosis. Left Kidney: Renal measurements: 13.5 x 7.2 x 6.4 cm = volume: 323.4 mL. Echogenicity is within normal limits.  Mild cortical thinning and renal sinus lipomatosis. No concerning renal mass, shadowing calculus or hydronephrosis. Bladder: Appears normal for degree of bladder distention. Other: None. IMPRESSION: Kidneys appear slightly enlarged bilaterally (upper limits normal for females 150 mL). With some chronic renal cortical thinning and renal sinus lipomatosis, can reflect a degree of chronic renal failure or diabetic nephropathy but with preservation of the parenchymal echogenicity arguing against a more acute medical renal disease. Simple 1.4 cm cyst in the upper pole right kidney. Electronically Signed   By: Lovena Le M.D.   On: 04/11/2020 19:19   DG Chest Port 1 View  Result Date: 04/13/2020 CLINICAL DATA:  Acute on chronic CHF, thyroid cancer EXAM: PORTABLE CHEST 1 VIEW COMPARISON:  Chest radiograph from one day prior. FINDINGS: Stable cardiomediastinal silhouette with mild to moderate cardiomegaly. No pneumothorax. No pleural effusion. Mild pulmonary edema, similar. IMPRESSION: Mild congestive heart failure, similar. Electronically Signed   By: Ilona Sorrel M.D.   On: 04/13/2020 09:29   DG Chest Port 1 View  Result Date:  04/12/2020 CLINICAL DATA:  Short of breath.  Cough EXAM: PORTABLE CHEST 1 VIEW COMPARISON:  04/01/2020 FINDINGS: Enlarged cardiac silhouette with ectatic aorta. There is patchy bilateral airspace disease. No focal consolidation. No pneumothorax. Mild improvement in airspace opacities. IMPRESSION: Patchy bilateral airspace disease suggests pulmonary edema. Mild improvement from comparison exam. Electronically Signed   By: Suzy Bouchard M.D.   On: 04/12/2020 10:40     Medications:   . sodium chloride    . sodium chloride 250 mL (04/12/20 2212)  . cefTRIAXone (ROCEPHIN)  IV 1 g (04/12/20 2220)   . aspirin EC  81 mg Oral Daily  . atorvastatin  20 mg Oral Q2200  . buPROPion  150 mg Oral Daily  . cholecalciferol  1,000 Units Oral Daily  . enoxaparin (LOVENOX) injection  40 mg Subcutaneous Q12H  . [START ON 04/14/2020] gabapentin  300 mg Oral QHS  . insulin aspart  0-5 Units Subcutaneous QHS  . insulin aspart  0-9 Units Subcutaneous TID WC  . levothyroxine  250 mcg Oral Q0600  . midodrine  10 mg Oral TID WC  . multivitamin with minerals  1 tablet Oral Daily  . rOPINIRole  2 mg Oral TID  . sodium chloride flush  3 mL Intravenous Q12H  . zinc sulfate  220 mg Oral Daily   sodium chloride, sodium chloride, acetaminophen, albuterol, dextromethorphan-guaiFENesin, hydrALAZINE, hydrOXYzine, oxyCODONE, sodium chloride flush  Assessment/ Plan:  64 y.o. female with  Diastolic dysfunction, CKD, DM-2, HTN, HLD, OSA , morbid obesity    admitted on 04/09/2020 for Acute CHF (congestive heart failure) ([place]) [I50.9] Acute respiratory failure with hypoxia and hypercapnia ([place]) [J96.01, J96.02] New onset of congestive heart failure ([place]) [I50.9]   # AKI  Baseline Cr 0.85 /  GFR > 60 on 02/28/2020 Renal US from 02/21/20: cortical thinning, no hydronephrosis, suspected left nephrolithiasis Low BP noted this admission  10/01 0701 - 10/02 0700 In: 240 [P.O.:240] Out: 1400 [Urine:1400]  Recent Labs Lab  Results  Component Value Date   CREATININE 1.45 (H) 04/13/2020   CREATININE 1.98 (H) 04/12/2020   CREATININE 2.55 (H) 04/11/2020    Plan Renal function improving slowly.  Creatinine down to 1.45.  Ch Sys CHF, acute exacerbation LVEF 50-55 % CXR shows congestion Aortic aneurysm (ascending) Appears to be in satisfactory volume control.  Urinary tract infection E. coli in the urine.  Currently on ceftriaxone.  Myoclonic jerks Possibly reduced clearance due to AKI Appears  improved at the moment.  Okay to continue gabapentin and ropinirole.    LOS: 4 Glora Hulgan 10/2/20212:41 PM  [place], [place]  Note: This note was prepared with Dragon dictation. Any transcription errors are unintentional

## 2020-04-13 NOTE — Progress Notes (Signed)
°   04/12/20 2300  Assess: MEWS Score  BP (!) 105/57  Pulse Rate (!) 57  ECG Heart Rate (!) 58  Resp 20  SpO2 94 %  O2 Device Bi-PAP  Patient Activity (if Appropriate) In bed  FiO2 (%) 30 %  Assess: MEWS Score  MEWS Temp 0  MEWS Systolic 0  MEWS Pulse 0  MEWS RR 0  MEWS LOC 1  MEWS Score 1  MEWS Score Color Green

## 2020-04-13 NOTE — Progress Notes (Signed)
Pulmonary Medicine          Date: 04/13/2020,   MRN# [id number] Madison Mcbride 1955/07/14     AdmissionWeight: (!) 148.8 kg                 CurrentWeight: (!) 151.6 kg  Referring physician: Dr. Mal Misty    CHIEF COMPLAINT:  Acute on chronic hypoxemic and hypercapnic respiratory failure   HISTORY OF PRESENT ILLNESS   This is a pleasant 63 year old female with a history significant for morbid obesity with a BMI over 55, chronic heart failure with preserved EF, thyroid cancer, restless leg syndrome, hypothyroidism, diabetes, obstructive sleep apnea, CKD, came into the hospital via emergency room for dyspnea and shortness of breath at rest which lasted for the last 3 to 4 days.  She also reported associated symptoms of cough which was dry and nonproductive however denied chest pain flulike illness nausea vomiting or diarrhea.  On arrival she was found to be acutely hypoxemic at 65% SPO2 but this did respond to supplemental oxygen and she was later placed on BiPAP which made her feel better.  She was Covid negative on arrival, had a venous blood gas drawn which showed mild acidemia with hypercapnia and hypoxemia.  Pulmonary consultation was placed for additional evaluation of acute on chronic hypoxemic and hypercapnic respiratory failure.   04/13/20- Patient states she feels better, her LE edema is improved, abdomen still feels swollen per patient, breathing is improved.    PAST MEDICAL HISTORY   Past Medical History:  Diagnosis Date   Diabetes mellitus without complication ([place])    Hypothyroidism    RLS (restless legs syndrome)    Thyroid cancer ([place]) 2007     SURGICAL HISTORY   Past Surgical History:  Procedure Laterality Date   ABDOMINAL HYSTERECTOMY     COLONOSCOPY WITH PROPOFOL N/A 08/26/2015   Procedure: COLONOSCOPY WITH PROPOFOL;  Surgeon: Manya Silvas, MD;  Location: [facility] ENDOSCOPY;  Service: Endoscopy;  Laterality: N/A;   THYROIDECTOMY        FAMILY HISTORY   Family History  Problem Relation Age of Onset   Breast cancer Neg Hx      SOCIAL HISTORY   Social History   Tobacco Use   Smoking status: Never Smoker   Smokeless tobacco: Never Used  Substance Use Topics   Alcohol use: No   Drug use: No     MEDICATIONS    Home Medication:    Current Medication:  Current Facility-Administered Medications:    0.9 %  sodium chloride infusion, 250 mL, Intravenous, PRN, Ivor Costa, MD   0.9 %  sodium chloride infusion, , Intravenous, PRN, Jennye Boroughs, MD, Last Rate: 10 mL/hr at 04/12/20 2212, 250 mL at 04/12/20 2212   acetaminophen (TYLENOL) tablet 650 mg, 650 mg, Oral, Q6H PRN, Jennye Boroughs, MD   albuterol (VENTOLIN HFA) 108 (90 Base) MCG/ACT inhaler 2 puff, 2 puff, Inhalation, Q4H PRN, Ivor Costa, MD   aspirin EC tablet 81 mg, 81 mg, Oral, Daily, Ivor Costa, MD, 81 mg at 04/13/20 0842   atorvastatin (LIPITOR) tablet 20 mg, 20 mg, Oral, Q2200, Ivor Costa, MD, 20 mg at 04/11/20 2226   buPROPion (WELLBUTRIN SR) 12 hr tablet 150 mg, 150 mg, Oral, Daily, Ivor Costa, MD, 150 mg at 04/13/20 0842   cefTRIAXone (ROCEPHIN) 1 g in sodium chloride 0.9 % 100 mL IVPB, 1 g, Intravenous, Q24H, Jennye Boroughs, MD, Last Rate: 200 mL/hr at 04/12/20 2220, 1 g at 04/12/20 2220  cholecalciferol (VITAMIN D) tablet 1,000 Units, 1,000 Units, Oral, Daily, Ivor Costa, MD, 1,000 Units at 04/13/20 0842   dextromethorphan-guaiFENesin ([place] DM) 30-600 MG per 12 hr tablet 1 tablet, 1 tablet, Oral, BID PRN, Ivor Costa, MD   enoxaparin (LOVENOX) injection 40 mg, 40 mg, Subcutaneous, Q12H, Ivor Costa, MD, 40 mg at 04/13/20 0843   gabapentin (NEURONTIN) capsule 300 mg, 300 mg, Oral, TID, Ivor Costa, MD, 300 mg at 04/13/20 2637   hydrALAZINE (APRESOLINE) injection 5 mg, 5 mg, Intravenous, Q2H PRN, Ivor Costa, MD   hydrOXYzine (ATARAX/VISTARIL) tablet 10 mg, 10 mg, Oral, TID PRN, Ivor Costa, MD   insulin aspart (novoLOG)  injection 0-5 Units, 0-5 Units, Subcutaneous, QHS, Niu, Soledad Gerlach, MD   insulin aspart (novoLOG) injection 0-9 Units, 0-9 Units, Subcutaneous, TID WC, Ivor Costa, MD, 1 Units at 04/12/20 1756   levothyroxine (SYNTHROID) tablet 250 mcg, 250 mcg, Oral, Q0600, Ivor Costa, MD, 250 mcg at 04/13/20 0721   midodrine (PROAMATINE) tablet 10 mg, 10 mg, Oral, TID WC, Murlean Iba, MD, 10 mg at 04/13/20 8588   multivitamin with minerals tablet 1 tablet, 1 tablet, Oral, Daily, Ivor Costa, MD, 1 tablet at 04/13/20 5027   oxyCODONE (Oxy IR/ROXICODONE) immediate release tablet 5 mg, 5 mg, Oral, Q8H PRN, Jennye Boroughs, MD, 5 mg at 04/12/20 1302   rOPINIRole (REQUIP) tablet 3 mg, 3 mg, Oral, TID, Ivor Costa, MD, 3 mg at 04/13/20 0842   sodium chloride flush (NS) 0.9 % injection 3 mL, 3 mL, Intravenous, Q12H, Ivor Costa, MD, 3 mL at 04/13/20 0844   sodium chloride flush (NS) 0.9 % injection 3 mL, 3 mL, Intravenous, PRN, Ivor Costa, MD, 3 mL at 04/09/20 2136   zinc sulfate capsule 220 mg, 220 mg, Oral, Daily, Ivor Costa, MD, 220 mg at 04/13/20 7412    ALLERGIES   Patient has no known allergies.     REVIEW OF SYSTEMS    Review of Systems:  Gen:  Denies  fever, sweats, chills weigh loss  HEENT: Denies blurred vision, double vision, ear pain, eye pain, hearing loss, nose bleeds, sore throat Cardiac:  No dizziness, chest pain or heaviness, chest tightness,edema Resp:   Denies cough or sputum porduction, shortness of breath,wheezing, hemoptysis,  Gi: Denies swallowing difficulty, stomach pain, nausea or vomiting, diarrhea, constipation, bowel incontinence Gu:  Denies bladder incontinence, burning urine Ext:   Denies Joint pain, stiffness or swelling Skin: Denies  skin rash, easy bruising or bleeding or hives Endoc:  Denies polyuria, polydipsia , polyphagia or weight change Psych:   Denies depression, insomnia or hallucinations   Other:  All other systems negative   VS: BP (!) 112/50 (BP  Location: Right Arm)    Pulse 66    Temp 98.5 F (36.9 C) (Oral)    Resp 16    Ht 5\' 5"  (1.651 m)    Wt (!) 151.6 kg    SpO2 95%    BMI 55.62 kg/m      PHYSICAL EXAM    GENERAL:NAD, no fevers, chills, no weakness no fatigue HEAD: Normocephalic, atraumatic.  EYES: Pupils equal, round, reactive to light. Extraocular muscles intact. No scleral icterus.  MOUTH: Moist mucosal membrane. Dentition intact. No abscess noted.  EAR, NOSE, THROAT: Clear without exudates. No external lesions.  NECK: Supple. No thyromegaly. No nodules. No JVD.  PULMONARY: Decreased breath sounds bilaterally without wheezing or rhonchorous lung sounds CARDIOVASCULAR: S1 and S2. Regular rate and rhythm. No murmurs, rubs, or gallops. No edema. Pedal pulses 2+  bilaterally.  GASTROINTESTINAL: Soft, nontender, nondistended. No masses. Positive bowel sounds. No hepatosplenomegaly.  Obese abdomen MUSCULOSKELETAL: No swelling, clubbing, or edema. Range of motion full in all extremities.  NEUROLOGIC: Cranial nerves II through XII are intact. No gross focal neurological deficits. Sensation intact. Reflexes intact.  SKIN: No ulceration, lesions, rashes, or cyanosis. Skin warm and dry. Turgor intact.  PSYCHIATRIC: Mood, affect within normal limits. The patient is awake, alert and oriented x 3. Insight, judgment intact.       IMAGING    US RENAL  Result Date: 04/11/2020 CLINICAL DATA:  Acute renal failure EXAM: RENAL / URINARY TRACT ULTRASOUND COMPLETE COMPARISON:  Renal ultrasound 10/22/2019 FINDINGS: Right Kidney: Renal measurements: 10.8 x 6.6 x 6.2 cm = volume: 233 mL. Echogenicity is within normal limits. Mild cortical thinning and renal sinus lipomatosis. Simple appearing 1.4 x 1.3 x 1.3 cm cyst arising from the upper pole right kidney. No concerning renal mass, shadowing calculus or hydronephrosis. Left Kidney: Renal measurements: 13.5 x 7.2 x 6.4 cm = volume: 323.4 mL. Echogenicity is within normal limits. Mild cortical  thinning and renal sinus lipomatosis. No concerning renal mass, shadowing calculus or hydronephrosis. Bladder: Appears normal for degree of bladder distention. Other: None. IMPRESSION: Kidneys appear slightly enlarged bilaterally (upper limits normal for females 150 mL). With some chronic renal cortical thinning and renal sinus lipomatosis, can reflect a degree of chronic renal failure or diabetic nephropathy but with preservation of the parenchymal echogenicity arguing against a more acute medical renal disease. Simple 1.4 cm cyst in the upper pole right kidney. Electronically Signed   By: Lovena Le M.D.   On: 04/11/2020 19:19   DG Chest Port 1 View  Result Date: 04/12/2020 CLINICAL DATA:  Short of breath.  Cough EXAM: PORTABLE CHEST 1 VIEW COMPARISON:  04/01/2020 FINDINGS: Enlarged cardiac silhouette with ectatic aorta. There is patchy bilateral airspace disease. No focal consolidation. No pneumothorax. Mild improvement in airspace opacities. IMPRESSION: Patchy bilateral airspace disease suggests pulmonary edema. Mild improvement from comparison exam. Electronically Signed   By: Suzy Bouchard M.D.   On: 04/12/2020 10:40   DG Chest Portable 1 View  Result Date: 04/09/2020 CLINICAL DATA:  Shortness of breath EXAM: PORTABLE CHEST 1 VIEW COMPARISON:  February 21, 2020 FINDINGS: There is cardiomegaly with pulmonary venous hypertension. There is mild interstitial edema. No consolidation. No adenopathy. No bone lesions. IMPRESSION: Cardiomegaly with pulmonary vascular congestion. Mild interstitial edema. Overall appearance is felt to be indicative of congestive heart failure. No consolidation. Electronically Signed   By: Lowella Grip III M.D.   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 -Likely due to thoracic restriction with obesity hypoventilation syndrome and obstructive sleep apnea overlap -Would like to perform spirometry with  graph to evaluate residual lung function and stratification of ventilatory defect -Arterial blood gas to allow patient for BiPAP qualification for home use -Consultation with [employer] for medical equipment and supplies  -Patient would benefit from bariatric evaluation -Complicated by fluid shifts secondary to heart failure and renal impairment -Would benefit from occupational and physical therapy -D-dimer to rule out pulmonary venous thromboembolism -RD nutritional evaluation for appropriate dietary changes with morbid obesity , hypothyroid and DM2     Thank you for allowing me to participate in the care of this patient.   Patient/Family are satisfied with care plan and all questions have been answered.   This document was prepared using Set designer  software and may include unintentional dictation errors.     Ottie Glazier, M.D.  Division of [facility]

## 2020-04-13 NOTE — Progress Notes (Addendum)
   04/12/20 2000  Assess: MEWS Score  BP (!) 123/59  Pulse Rate (!) 59  ECG Heart Rate 62  Resp (!) 21  Level of Consciousness Responds to Voice  SpO2 93 %  Assess: MEWS Score  MEWS Temp 0  MEWS Systolic 0  MEWS Pulse 0  MEWS RR 1  MEWS LOC 1  MEWS Score 2  MEWS Score Color Yellow  Assess: if the MEWS score is Yellow or Red  Were vital signs taken at a resting state? Yes  Focused Assessment No change from prior assessment  Early Detection of Sepsis Score *See Row Information* Low  MEWS guidelines implemented *See Row Information* Yes  Treat  MEWS Interventions Consulted Respiratory Therapy (Placed on bipap. )  Pain Scale 0-10  Pain Score 0   ABG results discussed with Rachael Fee, NP. No changes to treatment at this time; no orders received.

## 2020-04-13 NOTE — Progress Notes (Addendum)
Progress Note    Via Rosado  VOP:[id number] DOB: [birth year]  DOA: 04/09/2020 PCP: Rusty Aus, MD      Brief Narrative:    Medical records reviewed and are as summarized below:  Madison Mcbride is a 64 y.o. female  with medical history significant of dCHF, hyperlipidemia, diabetes mellitus, hypothyroidism, depression, thyroid cancer, morbid obesity, chronic back pain, OSA on CPAP, CKD-IIIa.  She presented to the hospital because of progressively worsening shortness of breath. Her oxygen saturation was low around 65% on room air.  She was admitted to the hospital for acute exacerbation of chronic diastolic CHF and acute hypoxemic respiratory failure.  Initially, she was treated with BiPAP and was subsequently transitioned to oxygen via nasal cannula.  ABG shows chronic hypercapnia and patient likely has obesity hypoventilation syndrome in addition to her obstructive sleep apnea.  She was treated with IV Lasix but she developed acute kidney injury so IV Lasix was held.  She was also started on midodrine for low blood pressure.  Nephrologist was consulted to assist with management of AKI with underlying CKD stage IIIa.  Cardiologist was also consulted to assist with management of CHF.  She was evaluated by the pulmonologist and patient will be a candidate for home BiPAP/trilogy machine for management of chronic hypoxemic and hypercapnic respiratory failure and OSA.  She developed acute change in mental status, lethargy and confusion).  This occurred few hours after she received oxycodone for pain.  She also had abnormal urinalysis and urine culture showed E. coli.  It was not clear whether encephalopathy was from oxycodone or from UTI.  She was treated with empiric IV Rocephin.   Assessment/Plan:   Principal Problem:   Acute on chronic diastolic CHF (congestive heart failure) (HCC) Active Problems:   Hypothyroidism   Acute respiratory failure with hypoxia and  hypercapnia (HCC)   Acute renal failure superimposed on stage 3a chronic kidney disease (HCC)   Diabetes mellitus without complication (HCC)   HLD (hyperlipidemia)   Depression   Elevated troponin   OSA (obstructive sleep apnea)  Hypotension-BP has improved AKI on CKD stage IIIa-creatinine is improving Anemia of chronic disease Chronic hypercapnia, probable obesity hypoventilation syndrome Chronic back pain from lumbar spinal canal stenosis Acute toxic encephalopathy from oxycodone on 04/12/2020 or ? from UTI ? E. coli UTI vs colonization  Body mass index is 55.62 kg/m.  (Morbid obesity)   PLAN  Creatinine is still improving.  Continue to hold Lasix for now.  Abnormal urinalysis with E. coli isolated in urine culture.  Is not clear whether this is true UTI or colonization.  We will continue IV Rocephin given recent encephalopathy.  Continue 4 L/min oxygen via nasal cannula.  Taper off as able.  Patient has chronic hypoxic and hypercapnic respiratory failure and will benefit from nightly BiPAP at home.  He has been evaluated by the pulmonologist.  Spirometry has been ordered.  Continue midodrine for low blood pressure.  Repeat chest x-ray done today showed mild CHF pattern.  Lasix is on hold for now.  Gabapentin has been decreased from 300 mg 3 times daily to 300 mg nightly and ropinirole has been decreased from 3 mg 3 times daily to 2 mg 3 times daily.  Follow-up with cardiologist, nephrologist and pulmonologist. PT recommends discharge to SNF. Plan discussed with patient and her husband at the bedside.   Diet Order            Diet 2 gram sodium  Room service appropriate? Yes; Fluid consistency: Thin  Diet effective now                    Consultants:  Cardiologist  Pulmonologist  Nephrologist  Procedures:  None    Medications:   . aspirin EC  81 mg Oral Daily  . atorvastatin  20 mg Oral Q2200  . buPROPion  150 mg Oral Daily  . cholecalciferol   1,000 Units Oral Daily  . enoxaparin (LOVENOX) injection  40 mg Subcutaneous Q12H  . [START ON 04/14/2020] gabapentin  300 mg Oral QHS  . insulin aspart  0-5 Units Subcutaneous QHS  . insulin aspart  0-9 Units Subcutaneous TID WC  . levothyroxine  250 mcg Oral Q0600  . midodrine  10 mg Oral TID WC  . multivitamin with minerals  1 tablet Oral Daily  . rOPINIRole  2 mg Oral TID  . sodium chloride flush  3 mL Intravenous Q12H  . zinc sulfate  220 mg Oral Daily   Continuous Infusions: . sodium chloride    . sodium chloride 250 mL (04/12/20 2212)  . cefTRIAXone (ROCEPHIN)  IV 1 g (04/12/20 2220)     Anti-infectives (From admission, onward)   Start     Dose/Rate Route Frequency Ordered Stop   04/12/20 2200  cefTRIAXone (ROCEPHIN) 1 g in sodium chloride 0.9 % 100 mL IVPB        1 g 200 mL/hr over 30 Minutes Intravenous Every 24 hours 04/12/20 2055               Family Communication/Anticipated D/C date and plan/Code Status   DVT prophylaxis: enoxaparin (LOVENOX) injection 40 mg Start: 04/09/20 2200     Code Status: Full Code  Family Communication: Plan discussed with husband at bedside Disposition Plan:    Status is: Inpatient  Remains inpatient appropriate because:IV treatments appropriate due to intensity of illness or inability to take PO and Inpatient level of care appropriate due to severity of illness   Dispo: The patient is from: Home              Anticipated d/c is to: Home              Anticipated d/c date is: 3 days              Patient currently is not medically stable to d/c.           Subjective:   Interval events noted. She became lethargic yesterday which was likely due to oxycodone she had received. She's better today. She has no urinary symptoms or fever.      Objective:    Vitals:   04/13/20 0423 04/13/20 0427 04/13/20 0755 04/13/20 1211  BP:   (!) 112/50 (!) 125/56  Pulse:   66 66  Resp:   16 20  Temp: 99.5 F (37.5 C)  98.5 F  (36.9 C) 99.5 F (37.5 C)  TempSrc: Oral  Oral Oral  SpO2:   95% 95%  Weight:  (!) 151.6 kg    Height:       No data found.   Intake/Output Summary (Last 24 hours) at 04/13/2020 1344 Last data filed at 04/13/2020 0208 Gross per 24 hour  Intake 240 ml  Output 1400 ml  Net -1160 ml   Filed Weights   04/11/20 0404 04/12/20 0412 04/13/20 0427  Weight: (!) 151.9 kg (!) 153 kg (!) 151.6 kg    Exam:   GEN: NAD SKIN:  Warm and dry EYES: EOMI ENT: MMM CV: RRR PULM: CTA B ABD: soft, obese, NT, +BS CNS: AAO x 3, non focal EXT: B/l leg edema with erythema, no tenderness      Data Reviewed:   I have personally reviewed following labs and imaging studies:  Labs: Labs show the following:   Basic Metabolic Panel: Recent Labs  Lab 04/09/20 1303 04/09/20 1303 04/10/20 0629 04/10/20 0629 04/11/20 0359 04/11/20 0359 04/12/20 0501 04/13/20 0738  NA 137  --  140  --  141  --  140 139  K 4.7   < > 5.0   < > 4.6   < > 4.6 4.6  CL 98  --  97*  --  99  --  100 99  CO2 28  --  32  --  30  --  32 32  GLUCOSE 100*  --  80  --  90  --  94 92  BUN 63*  --  63*  --  67*  --  68* 55*  CREATININE 2.23*  --  2.38*  --  2.55*  --  1.98* 1.45*  CALCIUM 8.4*  --  8.3*  --  7.9*  --  8.0* 9.0  MG  --   --  2.4  --   --   --   --   --    < > = values in this interval not displayed.   GFR Estimated Creatinine Clearance: 57.1 mL/min (A) (by C-G formula based on SCr of 1.45 mg/dL (H)). Liver Function Tests: No results for input(s): AST, ALT, ALKPHOS, BILITOT, PROT, ALBUMIN in the last 168 hours. No results for input(s): LIPASE, AMYLASE in the last 168 hours. No results for input(s): AMMONIA in the last 168 hours. Coagulation profile No results for input(s): INR, PROTIME in the last 168 hours.  CBC: Recent Labs  Lab 04/09/20 1303 04/10/20 0629 04/11/20 0359 04/12/20 0501 04/13/20 0738  WBC 11.8* 12.6* 12.8* 11.1* 10.5  NEUTROABS 8.3*  --  9.8* 8.3* 7.8*  HGB 12.2 10.8* 10.8*  10.8* 10.9*  HCT 41.3 36.0 34.4* 35.7* 37.6  MCV 97.2 95.2 93.0 95.7 98.2  PLT 445* 373 343 341 354   Cardiac Enzymes: No results for input(s): CKTOTAL, CKMB, CKMBINDEX, TROPONINI in the last 168 hours. BNP (last 3 results) No results for input(s): PROBNP in the last 8760 hours. CBG: Recent Labs  Lab 04/12/20 1309 04/12/20 1715 04/12/20 2056 04/13/20 0804 04/13/20 1212  GLUCAP 98 123* 100* 86 121*   D-Dimer: No results for input(s): DDIMER in the last 72 hours. Hgb A1c: No results for input(s): HGBA1C in the last 72 hours. Lipid Profile: No results for input(s): CHOL, HDL, LDLCALC, TRIG, CHOLHDL, LDLDIRECT in the last 72 hours. Thyroid function studies: No results for input(s): TSH, T4TOTAL, T3FREE, THYROIDAB in the last 72 hours.  Invalid input(s): FREET3 Anemia work up: No results for input(s): VITAMINB12, FOLATE, FERRITIN, TIBC, IRON, RETICCTPCT in the last 72 hours. Sepsis Labs: Recent Labs  Lab 04/10/20 0629 04/11/20 0359 04/12/20 0501 04/13/20 0738  WBC 12.6* 12.8* 11.1* 10.5    Microbiology Recent Results (from the past 240 hour(s))  Respiratory Panel by RT PCR (Flu A&B, Covid) - Nasopharyngeal Swab     Status: None   Collection Time: 04/09/20  1:17 PM   Specimen: Nasopharyngeal Swab  Result Value Ref Range Status   SARS Coronavirus 2 by RT PCR NEGATIVE NEGATIVE Final    Comment: (NOTE) SARS-CoV-2 target nucleic acids are  NOT DETECTED.  The SARS-CoV-2 RNA is generally detectable in upper respiratoy specimens during the acute phase of infection. The lowest concentration of SARS-CoV-2 viral copies this assay can detect is 131 copies/mL. A negative result does not preclude SARS-Cov-2 infection and should not be used as the sole basis for treatment or other patient management decisions. A negative result may occur with  improper specimen collection/handling, submission of specimen other than nasopharyngeal swab, presence of viral mutation(s) within  the areas targeted by this assay, and inadequate number of viral copies (<131 copies/mL). A negative result must be combined with clinical observations, patient history, and epidemiological information. The expected result is Negative.  Fact Sheet for Patients:  PinkCheek.be  Fact Sheet for Healthcare Providers:  GravelBags.it  This test is no t yet approved or cleared by the [place] FDA and  has been authorized for detection and/or diagnosis of SARS-CoV-2 by FDA under an Emergency Use Authorization (EUA). This EUA will remain  in effect (meaning this test can be used) for the duration of the COVID-19 declaration under Section 564(b)(1) of the Act, 21 U.S.C. section 360bbb-3(b)(1), unless the authorization is terminated or revoked sooner.     Influenza A by PCR NEGATIVE NEGATIVE Final   Influenza B by PCR NEGATIVE NEGATIVE Final    Comment: (NOTE) The Xpert Xpress SARS-CoV-2/FLU/RSV assay is intended as an aid in  the diagnosis of influenza from Nasopharyngeal swab specimens and  should not be used as a sole basis for treatment. Nasal washings and  aspirates are unacceptable for Xpert Xpress SARS-CoV-2/FLU/RSV  testing.  Fact Sheet for Patients: PinkCheek.be  Fact Sheet for Healthcare Providers: GravelBags.it  This test is not yet approved or cleared by the [place] FDA and  has been authorized for detection and/or diagnosis of SARS-CoV-2 by  FDA under an Emergency Use Authorization (EUA). This EUA will remain  in effect (meaning this test can be used) for the duration of the  Covid-19 declaration under Section 564(b)(1) of the Act, 21  U.S.C. section 360bbb-3(b)(1), unless the authorization is  terminated or revoked. Performed at [facility], [address] [place], [place] [address]   Urine Culture     Status: Abnormal (Preliminary  result)   Collection Time: April 14, 2020  4:50 PM   Specimen: Urine, Random  Result Value Ref Range Status   Specimen Description   Final    URINE, RANDOM Performed at [facility], [address] [place], [place] [address]    Special Requests   Final    NONE Performed at [facility], [address] [place], [place] [address]    Culture >=100,000 COLONIES/mL ESCHERICHIA COLI (A)  Final   Report Status PENDING  Incomplete    Procedures and diagnostic studies:  US RENAL  Result Date: 2020-04-14 CLINICAL DATA:  Acute renal failure EXAM: RENAL / URINARY TRACT ULTRASOUND COMPLETE COMPARISON:  Renal ultrasound 10/22/2019 FINDINGS: Right Kidney: Renal measurements: 10.8 x 6.6 x 6.2 cm = volume: 233 mL. Echogenicity is within normal limits. Mild cortical thinning and renal sinus lipomatosis. Simple appearing 1.4 x 1.3 x 1.3 cm cyst arising from the upper pole right kidney. No concerning renal mass, shadowing calculus or hydronephrosis. Left Kidney: Renal measurements: 13.5 x 7.2 x 6.4 cm = volume: 323.4 mL. Echogenicity is within normal limits. Mild cortical thinning and renal sinus lipomatosis. No concerning renal mass, shadowing calculus or hydronephrosis. Bladder: Appears normal for degree of bladder distention. Other: None. IMPRESSION: Kidneys appear slightly enlarged bilaterally (  upper limits normal for females 150 mL). With some chronic renal cortical thinning and renal sinus lipomatosis, can reflect a degree of chronic renal failure or diabetic nephropathy but with preservation of the parenchymal echogenicity arguing against a more acute medical renal disease. Simple 1.4 cm cyst in the upper pole right kidney. Electronically Signed   By: Lovena Le M.D.   On: 04/11/2020 19:19   DG Chest Port 1 View  Result Date: 04/13/2020 CLINICAL DATA:  Acute on chronic CHF, thyroid cancer EXAM: PORTABLE CHEST 1 VIEW COMPARISON:  Chest radiograph from one day prior. FINDINGS: Stable  cardiomediastinal silhouette with mild to moderate cardiomegaly. No pneumothorax. No pleural effusion. Mild pulmonary edema, similar. IMPRESSION: Mild congestive heart failure, similar. Electronically Signed   By: Ilona Sorrel M.D.   On: 04/13/2020 09:29   DG Chest Port 1 View  Result Date: 04/12/2020 CLINICAL DATA:  Short of breath.  Cough EXAM: PORTABLE CHEST 1 VIEW COMPARISON:  04/01/2020 FINDINGS: Enlarged cardiac silhouette with ectatic aorta. There is patchy bilateral airspace disease. No focal consolidation. No pneumothorax. Mild improvement in airspace opacities. IMPRESSION: Patchy bilateral airspace disease suggests pulmonary edema. Mild improvement from comparison exam. Electronically Signed   By: Suzy Bouchard M.D.   On: 04/12/2020 10:40               LOS: 4 days   Zakar Brosch  [facility]   Pager on [email or web address]. If 7PM-7AM, please contact night-coverage at [email or web address]     04/13/2020, 1:44 PM

## 2020-04-13 NOTE — TOC Initial Note (Signed)
Transition of Care [facility]) - Initial/Assessment Note    Patient Details  Name: Madison Mcbride MRN: [id number] Date of Birth: 10/14/1955  Transition of Care [facility]) CM/SW Contact:    Harriet Masson, RN Phone Number:[phone] 04/13/2020, 4:36 PM  Clinical Narrative:                 Spoke with pt who provided permission to speak with her spouse [name]). RN spoke with spouse concerning possible discharge plans based upon pt's recent evaluation via PT/OT for SNF. Educated on the level of care or SNF and explained the progress for facility options. Information provided and spouse okay with all local facility except for [facility]. Will completed the paperwork for possible SNF placement for available facilities based upon pt's evaluation.    Spouse also provided home information indicating help in the home to assist pt with her ADLs several days a week. Spouse states he still works so the assistance is very helpful.   Will start the [facility] for SNF facilities and continue to follow up accordingly with ongoing discharge plans.   Expected Discharge Plan: Skilled Nursing Facility Barriers to Discharge: Continued Medical Work up   Patient Goals and CMS Choice   CMS Medicare.gov Compare Post Acute Care list provided to:: Patient Represenative (must comment) (Spouse [name]) Choice offered to / list presented to : Spouse  Expected Discharge Plan and Services Expected Discharge Plan: [place] Choice: [place] arrangements for the past 2 months: Single Family Home                                      Prior Living Arrangements/Services Living arrangements for the past 2 months: Single Family Home Lives with:: Spouse, Other (Comment) (also has caregiver that lives with pt several days weekly) Patient language and need for interpreter reviewed:: No        Need for Family Participation in Patient Care: Yes  (Comment) Care giver support system in place?: Yes (comment) Current home services: Other (comment) (hired personal care) Criminal Activity/Legal Involvement Pertinent to Current Situation/Hospitalization: No - Comment as needed  Activities of Daily Living Home Assistive Devices/Equipment: None ADL Screening (condition at time of admission) Patient's cognitive ability adequate to safely complete daily activities?: No Is the patient deaf or have difficulty hearing?: No Does the patient have difficulty seeing, even when wearing glasses/contacts?: No Does the patient have difficulty concentrating, remembering, or making decisions?: No Patient able to express need for assistance with ADLs?: No Does the patient have difficulty dressing or bathing?: Yes Independently performs ADLs?: Yes (appropriate for developmental age) Does the patient have difficulty walking or climbing stairs?: Yes Weakness of Legs: Both Weakness of Arms/Hands: None  Permission Sought/Granted Permission sought to share information with : Facility Sport and exercise psychologist, Case Optician, dispensing granted to share information with : Yes, Verbal Permission Granted        Permission granted to share info w Relationship: Spouse  Permission granted to share info w Contact Information: [name]  Emotional Assessment Appearance:: Appears stated age Attitude/Demeanor/Rapport: Engaged Affect (typically observed): Stable Orientation: : Oriented to Place, Oriented to Situation, Oriented to Self   Psych Involvement: No (comment)  Admission diagnosis:  Acute CHF (congestive heart failure) (HCC) [I50.9] Acute respiratory failure with hypoxia and hypercapnia (HCC) [J96.01, J96.02] New onset of congestive heart failure ([place]) [I50.9] Patient Active Problem  List   Diagnosis Date Noted  . Acute on chronic diastolic CHF (congestive heart failure) ([place]) 04/09/2020  . Diabetes mellitus without complication ([place]) 27/51/7001  . HLD  (hyperlipidemia) 04/09/2020  . Depression 04/09/2020  . Elevated troponin 04/09/2020  . OSA (obstructive sleep apnea) 04/09/2020  . Lumbar radiculopathy, acute   . Acute midline low back pain without sciatica   . Type 2 diabetes mellitus with diabetic neuropathy, without long-term current use of insulin ([place]) 02/21/2020  . Hypothyroidism 02/21/2020  . Morbid obesity with BMI of 50.0-59.9, adult ([place]) 02/21/2020  . Severe sepsis ([place]) 02/21/2020  . CAP (community acquired pneumonia) 02/21/2020  . Bilateral cellulitis of lower leg 02/21/2020  . Acute respiratory failure with hypoxia and hypercapnia ([place]) 02/21/2020  . Acute renal failure superimposed on stage 3a chronic kidney disease ([place]) 02/21/2020  . Acute metabolic encephalopathy 74/94/4967  . Septic shock ([place]) 02/21/2020   PCP:  Rusty Aus, MD Pharmacy:   [id number] [address], [place] [address] Phone: [phone] Fax: [phone]     Social Determinants of Health (SDOH) Interventions    Readmission Risk Interventions No flowsheet data found.

## 2020-04-13 NOTE — Progress Notes (Signed)
[facility] Cardiology    SUBJECTIVE: Patient states that she is doing reasonably well still short of breath still lying in bed.  Denies any chest pain no blackout spells or syncope   Vitals:   04/13/20 0100 04/13/20 0423 04/13/20 0427 04/13/20 0755  BP: (!) 119/53   (!) 112/50  Pulse: (!) 58   66  Resp: 18   16  Temp:  99.5 F (37.5 C)  98.5 F (36.9 C)  TempSrc:  Oral  Oral  SpO2:    95%  Weight:   (!) 151.6 kg   Height:         Intake/Output Summary (Last 24 hours) at 04/13/2020 1004 Last data filed at 04/13/2020 0208 Gross per 24 hour  Intake 240 ml  Output 1400 ml  Net -1160 ml      PHYSICAL EXAM  General: Well developed, well nourished, in no acute distress morbid obesity HEENT:  Normocephalic and atramatic Neck:  No JVD.  Lungs: Clear bilaterally to auscultation and percussion. Heart: HRRR . Normal S1 and S2 without gallops or murmurs.  Abdomen: Bowel sounds are positive, abdomen soft and non-tender  Msk:  Back normal, normal gait. Normal strength and tone for age. Extremities: No clubbing, cyanosis or 3+edema.   Neuro: Alert and oriented X 3. Psych:  Good affect, responds appropriately   LABS: Basic Metabolic Panel: Recent Labs    04/12/20 0501 04/13/20 0738  NA 140 139  K 4.6 4.6  CL 100 99  CO2 32 32  GLUCOSE 94 92  BUN 68* 55*  CREATININE 1.98* 1.45*  CALCIUM 8.0* 9.0   Liver Function Tests: No results for input(s): AST, ALT, ALKPHOS, BILITOT, PROT, ALBUMIN in the last 72 hours. No results for input(s): LIPASE, AMYLASE in the last 72 hours. CBC: Recent Labs    04/12/20 0501 04/13/20 0738  WBC 11.1* 10.5  NEUTROABS 8.3* 7.8*  HGB 10.8* 10.9*  HCT 35.7* 37.6  MCV 95.7 98.2  PLT 341 354   Cardiac Enzymes: No results for input(s): CKTOTAL, CKMB, CKMBINDEX, TROPONINI in the last 72 hours. BNP: Invalid input(s): POCBNP D-Dimer: No results for input(s): DDIMER in the last 72 hours. Hemoglobin A1C: No results for input(s): HGBA1C in the  last 72 hours. Fasting Lipid Panel: No results for input(s): CHOL, HDL, LDLCALC, TRIG, CHOLHDL, LDLDIRECT in the last 72 hours. Thyroid Function Tests: No results for input(s): TSH, T4TOTAL, T3FREE, THYROIDAB in the last 72 hours.  Invalid input(s): FREET3 Anemia Panel: No results for input(s): VITAMINB12, FOLATE, FERRITIN, TIBC, IRON, RETICCTPCT in the last 72 hours.  US RENAL  Result Date: 04/11/2020 CLINICAL DATA:  Acute renal failure EXAM: RENAL / URINARY TRACT ULTRASOUND COMPLETE COMPARISON:  Renal ultrasound 10/22/2019 FINDINGS: Right Kidney: Renal measurements: 10.8 x 6.6 x 6.2 cm = volume: 233 mL. Echogenicity is within normal limits. Mild cortical thinning and renal sinus lipomatosis. Simple appearing 1.4 x 1.3 x 1.3 cm cyst arising from the upper pole right kidney. No concerning renal mass, shadowing calculus or hydronephrosis. Left Kidney: Renal measurements: 13.5 x 7.2 x 6.4 cm = volume: 323.4 mL. Echogenicity is within normal limits. Mild cortical thinning and renal sinus lipomatosis. No concerning renal mass, shadowing calculus or hydronephrosis. Bladder: Appears normal for degree of bladder distention. Other: None. IMPRESSION: Kidneys appear slightly enlarged bilaterally (upper limits normal for females 150 mL). With some chronic renal cortical thinning and renal sinus lipomatosis, can reflect a degree of chronic renal failure or diabetic nephropathy but with preservation of the parenchymal  echogenicity arguing against a more acute medical renal disease. Simple 1.4 cm cyst in the upper pole right kidney. Electronically Signed   By: Lovena Le M.D.   On: 04/11/2020 19:19   DG Chest Port 1 View  Result Date: 04/13/2020 CLINICAL DATA:  Acute on chronic CHF, thyroid cancer EXAM: PORTABLE CHEST 1 VIEW COMPARISON:  Chest radiograph from one day prior. FINDINGS: Stable cardiomediastinal silhouette with mild to moderate cardiomegaly. No pneumothorax. No pleural effusion. Mild pulmonary  edema, similar. IMPRESSION: Mild congestive heart failure, similar. Electronically Signed   By: Ilona Sorrel M.D.   On: 04/13/2020 09:29   DG Chest Port 1 View  Result Date: 04/12/2020 CLINICAL DATA:  Short of breath.  Cough EXAM: PORTABLE CHEST 1 VIEW COMPARISON:  04/01/2020 FINDINGS: Enlarged cardiac silhouette with ectatic aorta. There is patchy bilateral airspace disease. No focal consolidation. No pneumothorax. Mild improvement in airspace opacities. IMPRESSION: Patchy bilateral airspace disease suggests pulmonary edema. Mild improvement from comparison exam. Electronically Signed   By: Suzy Bouchard M.D.   On: 04/12/2020 10:40     Echo history of preserved left ventricular function ejection fraction between 50 and 32% with diastolic dysfunction  TELEMETRY: Sinus rhythm nonspecific ST-T wave changes  ASSESSMENT AND PLAN:  Principal Problem:   Acute on chronic diastolic CHF (congestive heart failure) (HCC) Active Problems:   Hypothyroidism   Acute respiratory failure with hypoxia and hypercapnia (HCC)   Acute renal failure superimposed on stage 3a chronic kidney disease (HCC)   Diabetes mellitus without complication (HCC)   HLD (hyperlipidemia)   Depression   Elevated troponin   OSA (obstructive sleep apnea)    Plan Agree with telemetry Continue Lasix drip to help with diuresis with renal insufficiency Agree with nephrology input for management of renal insufficiency Continue diabetes management and control CPAP for significant obstructive sleep apnea Recommend continue statin therapy for hyperlipidemia Recommend significant weight loss exercise portion control Continue conservative cardiac input at this point   Yolonda Kida, MD 04/13/2020 10:04 AM

## 2020-04-14 LAB — BASIC METABOLIC PANEL
Anion gap: 8 (ref 5–15)
BUN: 42 mg/dL — ABNORMAL HIGH (ref 8–23)
CO2: 33 mmol/L — ABNORMAL HIGH (ref 22–32)
Calcium: 8.9 mg/dL (ref 8.9–10.3)
Chloride: 101 mmol/L (ref 98–111)
Creatinine, Ser: 1.14 mg/dL — ABNORMAL HIGH (ref 0.44–1.00)
GFR calc Af Amer: 58 mL/min — ABNORMAL LOW (ref >60)
GFR calc non Af Amer: 50 mL/min — ABNORMAL LOW (ref >60)
Glucose, Bld: 119 mg/dL — ABNORMAL HIGH (ref 70–99)
Potassium: 4.7 mmol/L (ref 3.5–5.1)
Sodium: 142 mmol/L (ref 135–145)

## 2020-04-14 LAB — URINE CULTURE: Culture: >=100000 — AB

## 2020-04-14 LAB — GLUCOSE, CAPILLARY
Glucose-Capillary: 101 mg/dL — ABNORMAL HIGH (ref 70–99)
Glucose-Capillary: 114 mg/dL — ABNORMAL HIGH (ref 70–99)
Glucose-Capillary: 118 mg/dL — ABNORMAL HIGH (ref 70–99)
Glucose-Capillary: 105 mg/dL — ABNORMAL HIGH (ref 70–99)

## 2020-04-14 MED ORDER — CEPHALEXIN 500 MG PO CAPS
500.0000 mg | ORAL_CAPSULE | Freq: Two times a day (BID) | ORAL | Status: DC
  Administered 2020-04-14: 500 mg via ORAL
  Filled 2020-04-14: qty 1

## 2020-04-14 NOTE — NC FL2 (Signed)
[facility] LEVEL OF CARE SCREENING TOOL     IDENTIFICATION  Patient Name: Madison Mcbride Birthdate: 05-24-1956 Sex: female Admission Date (Current Location): 04/09/2020  [place] and [place] Number:  Engineering geologist and Address:  [facility], [address], [place],  [address]      Provider Number: [id number]  Attending Physician Name and Address:  Lorella Nimrod, MD  Relative Name and Phone Number:  [name] (spouse)    Current Level of Care: Hospital Recommended Level of Care: [place] Prior Approval Number:    Date Approved/Denied:   PASRR Number: [id number]  Discharge Plan: SNF    Current Diagnoses: Patient Active Problem List   Diagnosis Date Noted  . Acute on chronic diastolic CHF (congestive heart failure) ([place]) 04/09/2020  . Diabetes mellitus without complication ([place]) 48/25/0037  . HLD (hyperlipidemia) 04/09/2020  . Depression 04/09/2020  . Elevated troponin 04/09/2020  . OSA (obstructive sleep apnea) 04/09/2020  . Lumbar radiculopathy, acute   . Acute midline low back pain without sciatica   . Type 2 diabetes mellitus with diabetic neuropathy, without long-term current use of insulin ([place]) 02/21/2020  . Hypothyroidism 02/21/2020  . Morbid obesity with BMI of 50.0-59.9, adult ([place]) 02/21/2020  . Severe sepsis ([place]) 02/21/2020  . CAP (community acquired pneumonia) 02/21/2020  . Bilateral cellulitis of lower leg 02/21/2020  . Acute respiratory failure with hypoxia and hypercapnia ([place]) 02/21/2020  . Acute renal failure superimposed on stage 3a chronic kidney disease ([place]) 02/21/2020  . Acute metabolic encephalopathy 04/88/8916  . Septic shock ([place]) 02/21/2020    Orientation RESPIRATION BLADDER Height & Weight     Self, Situation, Place, Time  Normal   Weight: (!) 152.6 kg Height:  5\' 5"  (165.1 cm)  BEHAVIORAL SYMPTOMS/MOOD NEUROLOGICAL BOWEL NUTRITION STATUS           AMBULATORY  STATUS COMMUNICATION OF NEEDS Skin   Limited Assist Verbally Normal                       Personal Care Assistance Level of Assistance  Bathing, Dressing, Feeding Bathing Assistance: Limited assistance Feeding assistance: Limited assistance Dressing Assistance: Limited assistance     Functional Limitations Info             SPECIAL CARE FACTORS FREQUENCY                       Contractures      Additional Factors Info                  Current Medications (04/14/2020):  This is the current hospital active medication list Current Facility-Administered Medications  Medication Dose Route Frequency Provider Last Rate Last Admin  . 0.9 %  sodium chloride infusion  250 mL Intravenous PRN Ivor Costa, MD      . 0.9 %  sodium chloride infusion   Intravenous PRN Jennye Boroughs, MD 10 mL/hr at 04/13/20 2314 250 mL at 04/13/20 2314  . acetaminophen (TYLENOL) tablet 650 mg  650 mg Oral Q6H PRN Jennye Boroughs, MD   650 mg at 04/14/20 0245  . albuterol (VENTOLIN HFA) 108 (90 Base) MCG/ACT inhaler 2 puff  2 puff Inhalation Q4H PRN Ivor Costa, MD      . aspirin EC tablet 81 mg  81 mg Oral Daily Ivor Costa, MD   81 mg at 04/14/20 0933  . atorvastatin (LIPITOR) tablet 20 mg  20 mg Oral  [id number] Ivor Costa, MD   20 mg at 04/13/20 2303  . buPROPion (WELLBUTRIN SR) 12 hr tablet 150 mg  150 mg Oral Daily Ivor Costa, MD   150 mg at 04/14/20 0934  . cephALEXin (KEFLEX) capsule 500 mg  500 mg Oral Q12H Lorella Nimrod, MD      . cholecalciferol (VITAMIN D) tablet 1,000 Units  1,000 Units Oral Daily Ivor Costa, MD   1,000 Units at 04/14/20 0933  . dextromethorphan-guaiFENesin (MUCINEX DM) 30-600 MG per 12 hr tablet 1 tablet  1 tablet Oral BID PRN Ivor Costa, MD      . enoxaparin (LOVENOX) injection 40 mg  40 mg Subcutaneous Q12H Ivor Costa, MD   40 mg at 04/14/20 0932  . gabapentin (NEURONTIN) capsule 300 mg  300 mg Oral QHS Jennye Boroughs, MD      . hydrALAZINE (APRESOLINE) injection 5 mg  5  mg Intravenous Q2H PRN Ivor Costa, MD      . hydrOXYzine (ATARAX/VISTARIL) tablet 10 mg  10 mg Oral TID PRN Ivor Costa, MD      . insulin aspart (novoLOG) injection 0-5 Units  0-5 Units Subcutaneous QHS Ivor Costa, MD      . insulin aspart (novoLOG) injection 0-9 Units  0-9 Units Subcutaneous TID WC Ivor Costa, MD   1 Units at 04/12/20 1756  . levothyroxine (SYNTHROID) tablet 250 mcg  250 mcg Oral Q0600 Ivor Costa, MD   250 mcg at 04/14/20 0601  . midodrine (PROAMATINE) tablet 10 mg  10 mg Oral TID WC Murlean Iba, MD   10 mg at 04/14/20 1254  . multivitamin with minerals tablet 1 tablet  1 tablet Oral Daily Ivor Costa, MD   1 tablet at 04/14/20 0933  . oxyCODONE (Oxy IR/ROXICODONE) immediate release tablet 5 mg  5 mg Oral Q8H PRN Jennye Boroughs, MD   5 mg at 04/12/20 1302  . rOPINIRole (REQUIP) tablet 2 mg  2 mg Oral TID Jennye Boroughs, MD   2 mg at 04/14/20 0933  . sodium chloride flush (NS) 0.9 % injection 3 mL  3 mL Intravenous Q12H Ivor Costa, MD   3 mL at 04/14/20 0935  . sodium chloride flush (NS) 0.9 % injection 3 mL  3 mL Intravenous PRN Ivor Costa, MD   3 mL at 04/09/20 2136  . zinc sulfate capsule 220 mg  220 mg Oral Daily Ivor Costa, MD   220 mg at 04/14/20 4585     Discharge Medications: Please see discharge summary for a list of discharge medications.  Relevant Imaging Results:  Relevant Lab Results:   Additional Information SS# [id number]  Harriet Masson, RN

## 2020-04-14 NOTE — Plan of Care (Signed)

## 2020-04-14 NOTE — Progress Notes (Signed)
Pulmonary Medicine          Date: 04/14/2020,   MRN# [id number] Madison Mcbride 1956-02-04     AdmissionWeight: (!) 148.8 kg                 CurrentWeight: (!) 152.6 kg  Referring physician: Dr. 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  She also reported associated symptoms of cough which was dry and nonproductive however denied chest pain flulike illness nausea vomiting or diarrhea.  On arrival she was found to be acutely hypoxemic at 65% SPO2 but this did respond to supplemental oxygen and she was later placed on BiPAP which made her feel better.  She was Covid negative on arrival, had a venous blood gas drawn which showed mild acidemia with hypercapnia and hypoxemia.  Pulmonary consultation was placed for additional evaluation of acute on chronic hypoxemic and hypercapnic respiratory failure.   04/13/20- Patient states she feels better, her LE edema is improved, abdomen still feels swollen per patient, breathing is improved.  10/3- patient evaluated at bedside, husband in room.  We discussed BiPAP and follow up on outpatient. She is on CPAP at home and knows how equipment and supplies work so it should be a fairly easy transition.    PAST MEDICAL HISTORY   Past Medical History:  Diagnosis Date  . Diabetes mellitus without complication ([place])   . Hypothyroidism   . RLS (restless legs syndrome)   . Thyroid cancer ([place]) 2007     SURGICAL HISTORY   Past Surgical History:  Procedure Laterality Date  . ABDOMINAL HYSTERECTOMY     . COLONOSCOPY WITH PROPOFOL N/A 08/26/2015   Procedure: COLONOSCOPY WITH PROPOFOL;  Surgeon: Manya Silvas, MD;  Location: [facility] ENDOSCOPY;  Service: Endoscopy;  Laterality: N/A;  . THYROIDECTOMY       FAMILY HISTORY   Family History  Problem Relation Age of Onset  . Breast cancer Neg Hx      SOCIAL HISTORY   Social History   Tobacco Use  . Smoking status: Never Smoker  . Smokeless tobacco: Never Used  Substance Use Topics  . Alcohol use: No  . Drug use: No     MEDICATIONS    Home Medication:    Current Medication:  Current Facility-Administered Medications:  .  0.9 %  sodium chloride infusion, 250 mL, Intravenous, PRN, Ivor Costa, MD .  0.9 %  sodium chloride infusion, , Intravenous, PRN, Jennye Boroughs, MD, Last Rate: 10 mL/hr at 04/13/20 2314, 250 mL at 04/13/20 2314 .  acetaminophen (TYLENOL) tablet 650 mg, 650 mg, Oral, Q6H PRN, Jennye Boroughs, MD, 650 mg at 04/14/20 0245 .  albuterol (VENTOLIN HFA) 108 (90 Base) MCG/ACT inhaler 2 puff, 2 puff, Inhalation, Q4H PRN, Ivor Costa, MD .  aspirin EC tablet 81 mg, 81 mg, Oral, Daily, Ivor Costa, MD, 81 mg at 04/14/20 0933 .  atorvastatin (LIPITOR) tablet 20 mg, 20 mg, Oral, Q2200, Ivor Costa, MD, 20 mg at 04/13/20 2303 .  buPROPion (WELLBUTRIN SR) 12 hr tablet 150 mg, 150  mg, Oral, Daily, Ivor Costa, MD, 150 mg at 04/14/20 0934 .  cephALEXin (KEFLEX) capsule 500 mg, 500 mg, Oral, Q12H, Lorella Nimrod, MD .  cholecalciferol (VITAMIN D) tablet 1,000 Units, 1,000 Units, Oral, Daily, Ivor Costa, MD, 1,000 Units at 04/14/20 0933 .  dextromethorphan-guaiFENesin (MUCINEX DM) 30-600 MG per 12 hr tablet 1 tablet, 1 tablet, Oral, BID PRN, Ivor Costa, MD .  enoxaparin (LOVENOX) injection 40 mg, 40 mg, Subcutaneous, Q12H, Ivor Costa, MD, 40 mg at 04/14/20 0932 .  gabapentin (NEURONTIN) capsule 300 mg, 300 mg, Oral, QHS, Jennye Boroughs, MD .  hydrALAZINE (APRESOLINE) injection 5 mg, 5 mg, Intravenous, Q2H PRN, Ivor Costa, MD .   hydrOXYzine (ATARAX/VISTARIL) tablet 10 mg, 10 mg, Oral, TID PRN, Ivor Costa, MD .  insulin aspart (novoLOG) injection 0-5 Units, 0-5 Units, Subcutaneous, QHS, Niu, Xilin, MD .  insulin aspart (novoLOG) injection 0-9 Units, 0-9 Units, Subcutaneous, TID WC, Ivor Costa, MD, 1 Units at 04/12/20 1756 .  levothyroxine (SYNTHROID) tablet 250 mcg, 250 mcg, Oral, Q0600, Ivor Costa, MD, 250 mcg at 04/14/20 0601 .  midodrine (PROAMATINE) tablet 10 mg, 10 mg, Oral, TID WC, Candiss Norse, [place], MD, 10 mg at 04/14/20 1733 .  multivitamin with minerals tablet 1 tablet, 1 tablet, Oral, Daily, Ivor Costa, MD, 1 tablet at 04/14/20 0933 .  oxyCODONE (Oxy IR/ROXICODONE) immediate release tablet 5 mg, 5 mg, Oral, Q8H PRN, Jennye Boroughs, MD, 5 mg at 04/12/20 1302 .  rOPINIRole (REQUIP) tablet 2 mg, 2 mg, Oral, TID, Jennye Boroughs, MD, 2 mg at 04/14/20 1734 .  sodium chloride flush (NS) 0.9 % injection 3 mL, 3 mL, Intravenous, Q12H, Ivor Costa, MD, 3 mL at 04/14/20 0935 .  sodium chloride flush (NS) 0.9 % injection 3 mL, 3 mL, Intravenous, PRN, Ivor Costa, MD, 3 mL at 04/09/20 2136 .  zinc sulfate capsule 220 mg, 220 mg, Oral, Daily, Ivor Costa, MD, 220 mg at 04/14/20 2202    ALLERGIES   Patient has no known allergies.     REVIEW OF SYSTEMS    Review of Systems:  Gen:  Denies  fever, sweats, chills weigh loss  HEENT: Denies blurred vision, double vision, ear pain, eye pain, hearing loss, nose bleeds, sore throat Cardiac:  No dizziness, chest pain or heaviness, chest tightness,edema Resp:   Denies cough or sputum porduction, shortness of breath,wheezing, hemoptysis,  Gi: Denies swallowing difficulty, stomach pain, nausea or vomiting, diarrhea, constipation, bowel incontinence Gu:  Denies bladder incontinence, burning urine Ext:   Denies Joint pain, stiffness or swelling Skin: Denies  skin rash, easy bruising or bleeding or hives Endoc:  Denies polyuria, polydipsia , polyphagia or weight change Psych:    Denies depression, insomnia or hallucinations   Other:  All other systems negative   VS: BP 133/60   Pulse 65   Temp 99.8 F (37.7 C) (Oral)   Resp 19   Ht 5\' 5"  (1.651 m)   Wt (!) 152.6 kg   SpO2 97%   BMI 56.00 kg/m      PHYSICAL EXAM    GENERAL:NAD, no fevers, chills, no weakness no fatigue HEAD: Normocephalic, atraumatic.  EYES: Pupils equal, round, reactive to light. Extraocular muscles intact. No scleral icterus.  MOUTH: Moist mucosal membrane. Dentition intact. No abscess noted.  EAR, NOSE, THROAT: Clear without exudates. No external lesions.  NECK: Supple. No thyromegaly. No nodules. No JVD.  PULMONARY: Decreased breath sounds bilaterally without wheezing or rhonchorous lung sounds CARDIOVASCULAR: S1 and S2. Regular rate and rhythm.  No murmurs, rubs, or gallops. No edema. Pedal pulses 2+ bilaterally.  GASTROINTESTINAL: Soft, nontender, nondistended. No masses. Positive bowel sounds. No hepatosplenomegaly.  Obese abdomen MUSCULOSKELETAL: No swelling, clubbing, or edema. Range of motion full in all extremities.  NEUROLOGIC: Cranial nerves II through XII are intact. No gross focal neurological deficits. Sensation intact. Reflexes intact.  SKIN: No ulceration, lesions, rashes, or cyanosis. Skin warm and dry. Turgor intact.  PSYCHIATRIC: Mood, affect within normal limits. The patient is awake, alert and oriented x 3. Insight, judgment intact.       IMAGING    US RENAL  Result Date: 04/11/2020 CLINICAL DATA:  Acute renal failure EXAM: RENAL / URINARY TRACT ULTRASOUND COMPLETE COMPARISON:  Renal ultrasound 10/22/2019 FINDINGS: Right Kidney: Renal measurements: 10.8 x 6.6 x 6.2 cm = volume: 233 mL. Echogenicity is within normal limits. Mild cortical thinning and renal sinus lipomatosis. Simple appearing 1.4 x 1.3 x 1.3 cm cyst arising from the upper pole right kidney. No concerning renal mass, shadowing calculus or hydronephrosis. Left Kidney: Renal measurements: 13.5 x 7.2  x 6.4 cm = volume: 323.4 mL. Echogenicity is within normal limits. Mild cortical thinning and renal sinus lipomatosis. No concerning renal mass, shadowing calculus or hydronephrosis. Bladder: Appears normal for degree of bladder distention. Other: None. IMPRESSION: Kidneys appear slightly enlarged bilaterally (upper limits normal for females 150 mL). With some chronic renal cortical thinning and renal sinus lipomatosis, can reflect a degree of chronic renal failure or diabetic nephropathy but with preservation of the parenchymal echogenicity arguing against a more acute medical renal disease. Simple 1.4 cm cyst in the upper pole right kidney. Electronically Signed   By: Lovena Le M.D.   On: 04/11/2020 19:19   DG Chest Port 1 View  Result Date: 04/13/2020 CLINICAL DATA:  Acute on chronic CHF, thyroid cancer EXAM: PORTABLE CHEST 1 VIEW COMPARISON:  Chest radiograph from one day prior. FINDINGS: Stable cardiomediastinal silhouette with mild to moderate cardiomegaly. No pneumothorax. No pleural effusion. Mild pulmonary edema, similar. IMPRESSION: Mild congestive heart failure, similar. Electronically Signed   By: Ilona Sorrel M.D.   On: 04/13/2020 09:29   DG Chest Port 1 View  Result Date: 04/12/2020 CLINICAL DATA:  Short of breath.  Cough EXAM: PORTABLE CHEST 1 VIEW COMPARISON:  04/01/2020 FINDINGS: Enlarged cardiac silhouette with ectatic aorta. There is patchy bilateral airspace disease. No focal consolidation. No pneumothorax. Mild improvement in airspace opacities. IMPRESSION: Patchy bilateral airspace disease suggests pulmonary edema. Mild improvement from comparison exam. Electronically Signed   By: Suzy Bouchard M.D.   On: 04/12/2020 10:40   DG Chest Portable 1 View  Result Date: 04/09/2020 CLINICAL DATA:  Shortness of breath EXAM: PORTABLE CHEST 1 VIEW COMPARISON:  February 21, 2020 FINDINGS: There is cardiomegaly with pulmonary venous hypertension. There is mild interstitial edema. No  consolidation. No adenopathy. No bone lesions. IMPRESSION: Cardiomegaly with pulmonary vascular congestion. Mild interstitial edema. Overall appearance is felt to be indicative of congestive heart failure. No consolidation. Electronically Signed   By: Lowella Grip III M.D.   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 -Likely due to thoracic restriction with obesity hypoventilation syndrome and obstructive sleep apnea overlap -Would like to perform spirometry with graph to evaluate residual lung function and stratification of ventilatory defect -Arterial blood gas to allow patient for BiPAP qualification for home use -Consultation with [facility] for medical equipment and supplies  -Patient would benefit from bariatric  evaluation -Complicated by fluid shifts secondary to heart failure and renal impairment -Would benefit from occupational and physical therapy -D-dimer to rule out pulmonary venous thromboembolism -RD nutritional evaluation for appropriate dietary changes with morbid obesity , hypothyroid and DM2    Obstructive sleep apnea  - patient uses CPAP at home at 19cm H2O  - BIPAP while in house QHS   Thank you for allowing me to participate in the care of this patient.   Patient/Family are satisfied with care plan and all questions have been answered.   This document was prepared using Dragon voice recognition software and may include unintentional dictation errors.     Ottie Glazier, M.D.  Division of [place]

## 2020-04-14 NOTE — Progress Notes (Signed)
PROGRESS NOTE    Madison Mcbride  GEX:[id number] DOB: [birth year] DOA: 04/09/2020 PCP: Rusty Aus, MD   Brief Narrative:  Madison Mcbride is a 64 y.o. female with medical history significant ofdCHF,hyperlipidemia, diabetes mellitus, hypothyroidism, depression, thyroid cancer, morbid obesity, chronic back pain, OSA on CPAP, CKD-IIIa.  She presented to the hospital because of progressively worsening shortness of breath. Her oxygen saturation was low around 65% on room air.  She was admitted to the hospital for acute exacerbation of chronic diastolic CHF and acute hypoxemic respiratory failure.  Initially, she was treated with BiPAP and was subsequently transitioned to oxygen via nasal cannula.  ABG shows chronic hypercapnia and patient likely has obesity hypoventilation syndrome in addition to her obstructive sleep apnea.  She was treated with IV Lasix but she developed acute kidney injury so IV Lasix was held.  She was also started on midodrine for low blood pressure.  Nephrologist was consulted to assist with management of AKI with underlying CKD stage IIIa.  Cardiologist was also consulted to assist with management of CHF.  She was evaluated by the pulmonologist and patient will be a candidate for home BiPAP/trilogy machine for management of chronic hypoxemic and hypercapnic respiratory failure and OSA.  She developed acute change in mental status, lethargy and confusion).  This occurred few hours after she received oxycodone for pain.  She also had abnormal urinalysis and urine culture showed E. coli.  It was not clear whether encephalopathy was from oxycodone or from UTI.  She was treated with empiric IV Rocephin.  Subjective: Patient was seen and examined during morning rounds today.  No new complaint.  Husband was at bedside.  She was saturating well on 4 L, decrease to 2 with no significant change.  Assessment & Plan:   Principal Problem:   Acute on chronic diastolic CHF  (congestive heart failure) (HCC) Active Problems:   Hypothyroidism   Acute respiratory failure with hypoxia and hypercapnia (HCC)   Acute renal failure superimposed on stage 3a chronic kidney disease (HCC)   Diabetes mellitus without complication (HCC)   HLD (hyperlipidemia)   Depression   Elevated troponin   OSA (obstructive sleep apnea)  Acute hypoxic and hypercapnic respiratory failure.  Initially requiring BiPAP, not transition to [place], saturating well on 4L, decreased to 2 with no significant change in saturation.  Concern of obesity hypoventilation syndrome along with sleep apnea.  D-dimer was checked yesterday which was elevated at 5333. -VQ scan as we will avoid contrast secondary to AKI with CKD. -Continue BiPAP at nighttime. -Pulmonary is following-appreciate their recommendations. -They are recommending BiPAP at night instead of CPAP at home.  UTI.  Urine culture with E. coli, good sensitivity.  She was on ceftriaxone. -Switch ceftriaxone with Keflex to complete a 5-day course.  HFpEF.  There was some concern of CHF exacerbation and she was started on IV Lasix, resulted in AKI and diuretics were discontinued. -Continue holding diuretic and monitor.  Hypothyroidism. -Continue home dose of Synthroid.  Chronic pain syndrome.  -Continue home dose of oxycodone, ropinirole and gabapentin.  AKI.  Most likely secondary to diuresis.  Creatinine improving.  Baseline below 1.  Nephrology was consulted-appreciate their recommendations. Renal ultrasound with cortical thinning, no hydronephrosis and a suspected left nephrolithiasis.  Hypotension.  Blood pressure within goal with midodrine. -Continue midodrine and monitor.  Morbid obesity. Body mass index is 56 kg/m.  -This will complicate overall management and prognosis.  Objective: Vitals:   04/14/20 1200 04/14/20 1400 04/14/20  1600 04/14/20 1800  BP: 133/60 (!) 68/45 (!) 114/98 140/70  Pulse: 65 66 65 66  Resp: 19 17 14 19    Temp: 99.8 F (37.7 C)  99 F (37.2 C)   TempSrc: Oral     SpO2: 97% 93% 92% 91%  Weight:      Height:        Intake/Output Summary (Last 24 hours) at 04/14/2020 1953 Last data filed at 04/14/2020 1229 Gross per 24 hour  Intake --  Output 1200 ml  Net -1200 ml   Filed Weights   04/12/20 0412 04/13/20 0427 04/14/20 0534  Weight: (!) 153 kg (!) 151.6 kg (!) 152.6 kg    Examination:  General exam: Morbidly obese lady, appears calm and comfortable  Respiratory system: Clear to auscultation. Respiratory effort normal. Cardiovascular system: S1 & S2 heard, RRR. No JVD, murmurs, Gastrointestinal system: Soft, nontender, nondistended, bowel sounds positive. Central nervous system: Alert and oriented. No focal neurological deficits. Extremities: No edema, no cyanosis, pulses intact and symmetrical. Psychiatry: Judgement and insight appear normal.    DVT prophylaxis: Lovenox Code Status: Full Family Communication: Husband was updated at bedside Disposition Plan:  Status is: Inpatient  Remains inpatient appropriate because:Inpatient level of care appropriate due to severity of illness   Dispo: The patient is from: Home              Anticipated d/c is to: SNF              Anticipated d/c date is: 1 day              Patient currently is not medically stable to d/c.  Consultants:   Cardiology  Pulmonology  Nephrology  Procedures:  Antimicrobials:  Keflex  Data Reviewed: I have personally reviewed following labs and imaging studies  CBC: Recent Labs  Lab 04/09/20 1303 04/10/20 0629 04/11/20 0359 04/12/20 0501 04/13/20 0738  WBC 11.8* 12.6* 12.8* 11.1* 10.5  NEUTROABS 8.3*  --  9.8* 8.3* 7.8*  HGB 12.2 10.8* 10.8* 10.8* 10.9*  HCT 41.3 36.0 34.4* 35.7* 37.6  MCV 97.2 95.2 93.0 95.7 98.2  PLT 445* 373 343 341 465   Basic Metabolic Panel: Recent Labs  Lab 04/10/20 0629 04/11/20 0359 04/12/20 0501 04/13/20 0738 04/14/20 0314  NA 140 141 140 139 142  K  5.0 4.6 4.6 4.6 4.7  CL 97* 99 100 99 101  CO2 32 30 32 32 33*  GLUCOSE 80 90 94 92 119*  BUN 63* 67* 68* 55* 42*  CREATININE 2.38* 2.55* 1.98* 1.45* 1.14*  CALCIUM 8.3* 7.9* 8.0* 9.0 8.9  MG 2.4  --   --   --   --    GFR: Estimated Creatinine Clearance: 73 mL/min (A) (by C-G formula based on SCr of 1.14 mg/dL (H)). Liver Function Tests: No results for input(s): AST, ALT, ALKPHOS, BILITOT, PROT, ALBUMIN in the last 168 hours. No results for input(s): LIPASE, AMYLASE in the last 168 hours. No results for input(s): AMMONIA in the last 168 hours. Coagulation Profile: No results for input(s): INR, PROTIME in the last 168 hours. Cardiac Enzymes: No results for input(s): CKTOTAL, CKMB, CKMBINDEX, TROPONINI in the last 168 hours. BNP (last 3 results) No results for input(s): PROBNP in the last 8760 hours. HbA1C: No results for input(s): HGBA1C in the last 72 hours. CBG: Recent Labs  Lab 04/13/20 1549 04/13/20 2135 04/14/20 0732 04/14/20 1159 04/14/20 1630  GLUCAP 118* 104* 101* 114* 118*   Lipid Profile: No results  for input(s): CHOL, HDL, LDLCALC, TRIG, CHOLHDL, LDLDIRECT in the last 72 hours. Thyroid Function Tests: No results for input(s): TSH, T4TOTAL, FREET4, T3FREE, THYROIDAB in the last 72 hours. Anemia Panel: No results for input(s): VITAMINB12, FOLATE, FERRITIN, TIBC, IRON, RETICCTPCT in the last 72 hours. Sepsis Labs: No results for input(s): PROCALCITON, LATICACIDVEN in the last 168 hours.  Recent Results (from the past 240 hour(s))  Respiratory Panel by RT PCR (Flu A&B, Covid) - Nasopharyngeal Swab     Status: None   Collection Time: 04/09/20  1:17 PM   Specimen: Nasopharyngeal Swab  Result Value Ref Range Status   SARS Coronavirus 2 by RT PCR NEGATIVE NEGATIVE Final    Comment: (NOTE) SARS-CoV-2 target nucleic acids are NOT DETECTED.  The SARS-CoV-2 RNA is generally detectable in upper respiratoy specimens during the acute phase of infection. The  lowest concentration of SARS-CoV-2 viral copies this assay can detect is 131 copies/mL. A negative result does not preclude SARS-Cov-2 infection and should not be used as the sole basis for treatment or other patient management decisions. A negative result may occur with  improper specimen collection/handling, submission of specimen other than nasopharyngeal swab, presence of viral mutation(s) within the areas targeted by this assay, and inadequate number of viral copies (<131 copies/mL). A negative result must be combined with clinical observations, patient history, and epidemiological information. The expected result is Negative.  Fact Sheet for Patients:  PinkCheek.be  Fact Sheet for Healthcare Providers:  GravelBags.it  This test is no t yet approved or cleared by the [place] FDA and  has been authorized for detection and/or diagnosis of SARS-CoV-2 by FDA under an Emergency Use Authorization (EUA). This EUA will remain  in effect (meaning this test can be used) for the duration of the COVID-19 declaration under Section 564(b)(1) of the Act, 21 U.S.C. section 360bbb-3(b)(1), unless the authorization is terminated or revoked sooner.     Influenza A by PCR NEGATIVE NEGATIVE Final   Influenza B by PCR NEGATIVE NEGATIVE Final    Comment: (NOTE) The Xpert Xpress SARS-CoV-2/FLU/RSV assay is intended as an aid in  the diagnosis of influenza from Nasopharyngeal swab specimens and  should not be used as a sole basis for treatment. Nasal washings and  aspirates are unacceptable for Xpert Xpress SARS-CoV-2/FLU/RSV  testing.  Fact Sheet for Patients: PinkCheek.be  Fact Sheet for Healthcare Providers: GravelBags.it  This test is not yet approved or cleared by the [place] FDA and  has been authorized for detection and/or diagnosis of SARS-CoV-2 by  FDA under  an Emergency Use Authorization (EUA). This EUA will remain  in effect (meaning this test can be used) for the duration of the  Covid-19 declaration under Section 564(b)(1) of the Act, 21  U.S.C. section 360bbb-3(b)(1), unless the authorization is  terminated or revoked. Performed at [facility], [address] [place], [place] [address]   Urine Culture     Status: Abnormal   Collection Time: 04/11/20  4:50 PM   Specimen: Urine, Random  Result Value Ref Range Status   Specimen Description   Final    URINE, RANDOM Performed at [facility], [address], [place], [place] [address]    Special Requests   Final    NONE Performed at [facility], [address] [place],  [address]    Culture >=100,000 COLONIES/mL ESCHERICHIA COLI (A)  Final   Report Status 04/14/2020 FINAL  Final   Organism ID, Bacteria ESCHERICHIA COLI (A)  Final      Susceptibility   Escherichia coli - MIC*    AMPICILLIN 4 SENSITIVE Sensitive     CEFAZOLIN <=4 SENSITIVE Sensitive     CEFTRIAXONE <=0.25 SENSITIVE Sensitive     CIPROFLOXACIN >=4 RESISTANT Resistant     GENTAMICIN <=1 SENSITIVE Sensitive     IMIPENEM <=0.25 SENSITIVE Sensitive     NITROFURANTOIN <=16 SENSITIVE Sensitive     TRIMETH/SULFA <=20 SENSITIVE Sensitive     AMPICILLIN/SULBACTAM <=2 SENSITIVE Sensitive     PIP/TAZO <=4 SENSITIVE Sensitive     * >=100,000 COLONIES/mL ESCHERICHIA COLI     Radiology Studies: DG Chest Port 1 View  Result Date: 04/13/2020 CLINICAL DATA:  Acute on chronic CHF, thyroid cancer EXAM: PORTABLE CHEST 1 VIEW COMPARISON:  Chest radiograph from one day prior. FINDINGS: Stable cardiomediastinal silhouette with mild to moderate cardiomegaly. No pneumothorax. No pleural effusion. Mild pulmonary edema, similar. IMPRESSION: Mild congestive heart failure, similar. Electronically Signed   By: Ilona Sorrel M.D.   On: 04/13/2020 09:29    Scheduled Meds: . aspirin EC  81 mg Oral Daily   . atorvastatin  20 mg Oral Q2200  . buPROPion  150 mg Oral Daily  . cephALEXin  500 mg Oral Q12H  . cholecalciferol  1,000 Units Oral Daily  . enoxaparin (LOVENOX) injection  40 mg Subcutaneous Q12H  . gabapentin  300 mg Oral QHS  . insulin aspart  0-5 Units Subcutaneous QHS  . insulin aspart  0-9 Units Subcutaneous TID WC  . levothyroxine  250 mcg Oral Q0600  . midodrine  10 mg Oral TID WC  . multivitamin with minerals  1 tablet Oral Daily  . rOPINIRole  2 mg Oral TID  . sodium chloride flush  3 mL Intravenous Q12H  . zinc sulfate  220 mg Oral Daily   Continuous Infusions: . sodium chloride    . sodium chloride 250 mL (04/13/20 2314)     LOS: 5 days   Time spent: 35 minutes  Lorella Nimrod, MD [facility]  If 7PM-7AM, please contact night-coverage [email or web address]  04/14/2020, 7:53 PM   This record has been created using Systems analyst. Errors have been sought and corrected,but may not always be located. Such creation errors do not reflect on the standard of care.

## 2020-04-14 NOTE — Progress Notes (Signed)
[facility] Cardiology    SUBJECTIVE: Patient states to be doing reasonably well shortness of breath is somewhat improved denies any chest pain resting comfortably.   Vitals:   04/14/20 0534 04/14/20 0600 04/14/20 0605 04/14/20 0800  BP: 106/65 117/82  (!) 116/56  Pulse: 66 69  67  Resp:  (!) 35 19 17  Temp: 99.3 F (37.4 C)   99.1 F (37.3 C)  TempSrc: Oral   Oral  SpO2: 96% 95%  96%  Weight: (!) 152.6 kg     Height:         Intake/Output Summary (Last 24 hours) at 04/14/2020 0942 Last data filed at 04/14/2020 0105 Gross per 24 hour  Intake --  Output 900 ml  Net -900 ml      PHYSICAL EXAM  General: Well developed, well nourished, in no acute distress HEENT:  Normocephalic and atramatic Neck:  No JVD.  Lungs: Clear bilaterally to auscultation and percussion. Heart: HRRR . Normal S1 and S2 without gallops or murmurs.  Abdomen: Bowel sounds are positive, abdomen soft and non-tender  Msk:  Back normal, normal gait. Normal strength and tone for age. Extremities: No clubbing, cyanosis or edema.   Neuro: Alert and oriented X 3. Psych:  Good affect, responds appropriately   LABS: Basic Metabolic Panel: Recent Labs    04/13/20 0738 04/14/20 0314  NA 139 142  K 4.6 4.7  CL 99 101  CO2 32 33*  GLUCOSE 92 119*  BUN 55* 42*  CREATININE 1.45* 1.14*  CALCIUM 9.0 8.9   Liver Function Tests: No results for input(s): AST, ALT, ALKPHOS, BILITOT, PROT, ALBUMIN in the last 72 hours. No results for input(s): LIPASE, AMYLASE in the last 72 hours. CBC: Recent Labs    04/12/20 0501 04/13/20 0738  WBC 11.1* 10.5  NEUTROABS 8.3* 7.8*  HGB 10.8* 10.9*  HCT 35.7* 37.6  MCV 95.7 98.2  PLT 341 354   Cardiac Enzymes: No results for input(s): CKTOTAL, CKMB, CKMBINDEX, TROPONINI in the last 72 hours. BNP: Invalid input(s): POCBNP D-Dimer: No results for input(s): DDIMER in the last 72 hours. Hemoglobin A1C: No results for input(s): HGBA1C in the last 72 hours. Fasting Lipid  Panel: No results for input(s): CHOL, HDL, LDLCALC, TRIG, CHOLHDL, LDLDIRECT in the last 72 hours. Thyroid Function Tests: No results for input(s): TSH, T4TOTAL, T3FREE, THYROIDAB in the last 72 hours.  Invalid input(s): FREET3 Anemia Panel: No results for input(s): VITAMINB12, FOLATE, FERRITIN, TIBC, IRON, RETICCTPCT in the last 72 hours.  DG Chest Port 1 View  Result Date: 04/13/2020 CLINICAL DATA:  Acute on chronic CHF, thyroid cancer EXAM: PORTABLE CHEST 1 VIEW COMPARISON:  Chest radiograph from one day prior. FINDINGS: Stable cardiomediastinal silhouette with mild to moderate cardiomegaly. No pneumothorax. No pleural effusion. Mild pulmonary edema, similar. IMPRESSION: Mild congestive heart failure, similar. Electronically Signed   By: Ilona Sorrel M.D.   On: 04/13/2020 09:29   DG Chest Port 1 View  Result Date: 04/12/2020 CLINICAL DATA:  Short of breath.  Cough EXAM: PORTABLE CHEST 1 VIEW COMPARISON:  04/01/2020 FINDINGS: Enlarged cardiac silhouette with ectatic aorta. There is patchy bilateral airspace disease. No focal consolidation. No pneumothorax. Mild improvement in airspace opacities. IMPRESSION: Patchy bilateral airspace disease suggests pulmonary edema. Mild improvement from comparison exam. Electronically Signed   By: Suzy Bouchard M.D.   On: 04/12/2020 10:40     Echo preserved left ventricular function around 50%  TELEMETRY: Normal sinus rhythm nonspecific ST-T wave changes  ASSESSMENT AND PLAN:  Principal Problem:   Acute on chronic diastolic CHF (congestive heart failure) (HCC) Active Problems:   Hypothyroidism   Acute respiratory failure with hypoxia and hypercapnia (HCC)   Acute renal failure superimposed on stage 3a chronic kidney disease (HCC)   Diabetes mellitus without complication (HCC)   HLD (hyperlipidemia)   Depression   Elevated troponin   OSA (obstructive sleep apnea)    Plan continue diuresis Agree with telemetry follow-up EKGs  troponins Recommend supplemental oxygen as necessary Proceed with Lasix therapy as per nephrology Continue lipid therapy Continue CPAP therapy for obstructive sleep apnea Strongly recommend weight loss exercise portion control Agree with nephrology input for continued nephrology management and control Maintain diabetes management and control Conservative cardiac management at this point    Yolonda Kida, MD 04/14/2020 9:42 AM

## 2020-04-14 NOTE — Progress Notes (Signed)
Pt off of Bipap @0230 , back on nasal cannula@ 4liters

## 2020-04-15 ENCOUNTER — Inpatient Hospital Stay: Payer: BC Managed Care – PPO

## 2020-04-15 LAB — BASIC METABOLIC PANEL
Anion gap: 9 (ref 5–15)
BUN: 28 mg/dL — ABNORMAL HIGH (ref 8–23)
CO2: 36 mmol/L — ABNORMAL HIGH (ref 22–32)
Calcium: 9.3 mg/dL (ref 8.9–10.3)
Chloride: 100 mmol/L (ref 98–111)
Creatinine, Ser: 0.93 mg/dL (ref 0.44–1.00)
GFR calc Af Amer: >60 mL/min (ref >60)
GFR calc non Af Amer: >60 mL/min (ref >60)
Glucose, Bld: 80 mg/dL (ref 70–99)
Potassium: 4.4 mmol/L (ref 3.5–5.1)
Sodium: 145 mmol/L (ref 135–145)

## 2020-04-15 LAB — CBC
HCT: 37.4 % (ref 36.0–46.0)
Hemoglobin: 11.1 g/dL — ABNORMAL LOW (ref 12.0–15.0)
MCH: 28.1 pg (ref 26.0–34.0)
MCHC: 29.7 g/dL — ABNORMAL LOW (ref 30.0–36.0)
MCV: 94.7 fL (ref 80.0–100.0)
Platelets: 392 10*3/uL (ref 150–400)
RBC: 3.95 MIL/uL (ref 3.87–5.11)
RDW: 20.8 % — ABNORMAL HIGH (ref 11.5–15.5)
WBC: 10 10*3/uL (ref 4.0–10.5)
nRBC: 0 % (ref 0.0–0.2)

## 2020-04-15 LAB — GLUCOSE, CAPILLARY
Glucose-Capillary: 92 mg/dL (ref 70–99)
Glucose-Capillary: 97 mg/dL (ref 70–99)
Glucose-Capillary: 102 mg/dL — ABNORMAL HIGH (ref 70–99)
Glucose-Capillary: 102 mg/dL — ABNORMAL HIGH (ref 70–99)

## 2020-04-15 MED ORDER — TECHNETIUM TO 99M ALBUMIN AGGREGATED
4.0630 | Freq: Once | INTRAVENOUS | Status: AC | PRN
  Administered 2020-04-15: 4.063 via INTRAVENOUS

## 2020-04-15 MED ORDER — CEPHALEXIN 250 MG PO CAPS
250.0000 mg | ORAL_CAPSULE | Freq: Four times a day (QID) | ORAL | Status: AC
  Administered 2020-04-15 – 2020-04-16 (×4): 250 mg via ORAL
  Filled 2020-04-15 (×6): qty 1

## 2020-04-15 NOTE — Progress Notes (Signed)
PROGRESS NOTE    Madison Mcbride  EGB:[id number] DOB: 1955/10/19 DOA: 04/09/2020 PCP: Rusty Aus, MD   Brief Narrative:  Madison Mcbride is a 63 y.o. female with medical history significant ofdCHF,hyperlipidemia, diabetes mellitus, hypothyroidism, depression, thyroid cancer, morbid obesity, chronic back pain, OSA on CPAP, CKD-IIIa.  She presented to the hospital because of progressively worsening shortness of breath. Her oxygen saturation was low around 65% on room air.  She was admitted to the hospital for acute exacerbation of chronic diastolic CHF and acute hypoxemic respiratory failure.  Initially, she was treated with BiPAP and was subsequently transitioned to oxygen via nasal cannula.  ABG shows chronic hypercapnia and patient likely has obesity hypoventilation syndrome in addition to her obstructive sleep apnea.  She was treated with IV Lasix but she developed acute kidney injury so IV Lasix was held.  She was also started on midodrine for low blood pressure.  Nephrologist was consulted to assist with management of AKI with underlying CKD stage IIIa.  Cardiologist was also consulted to assist with management of CHF.  She was evaluated by the pulmonologist and patient will be a candidate for home BiPAP/trilogy machine for management of chronic hypoxemic and hypercapnic respiratory failure and OSA.  She developed acute change in mental status, lethargy and confusion).  This occurred few hours after she received oxycodone for pain.  She also had abnormal urinalysis and urine culture showed E. coli.  It was not clear whether encephalopathy was from oxycodone or from UTI.  She was treated with empiric IV Rocephin.  Subjective: Overnight nursing concern that patient was taking BiPAP off every 2 hour.  When asked she said that it goes of by itself, it is hard to keep on her face. No new complaint.  She just came back from VQ scan.  Assessment & Plan:   Principal Problem:    Acute on chronic diastolic CHF (congestive heart failure) (HCC) Active Problems:   Hypothyroidism   Acute respiratory failure with hypoxia and hypercapnia (HCC)   Acute renal failure superimposed on stage 3a chronic kidney disease (HCC)   Diabetes mellitus without complication (HCC)   HLD (hyperlipidemia)   Depression   Elevated troponin   OSA (obstructive sleep apnea)  Acute hypoxic and hypercapnic respiratory failure.  Initially requiring BiPAP, not transition to [place], saturating well on 4L, decreased to 2 with no significant change in saturation.  Concern of obesity hypoventilation syndrome along with sleep apnea.  D-dimer was checked yesterday which was elevated at 5333. -VQ scan was without any perfusion defect. -Continue BiPAP at nighttime. -Pulmonary is following-appreciate their recommendations. -They are recommending BiPAP at night instead of CPAP at home.  UTI.  Urine culture with E. coli, good sensitivity.  She was on ceftriaxone. -Switch ceftriaxone with Keflex to complete a 5-day course, day 4 today.  HFpEF.  There was some concern of CHF exacerbation and she was started on IV Lasix, resulted in AKI and diuretics were discontinued. -Continue holding diuretic and monitor.  Hypothyroidism. -Continue home dose of Synthroid.  Chronic pain syndrome.  -Continue home dose of oxycodone, ropinirole and gabapentin.  AKI.  Resolved.  Most likely secondary to diuresis. Renal ultrasound with cortical thinning, no hydronephrosis and a suspected left nephrolithiasis. -Continue to monitor  Hypotension.  Blood pressure within goal with midodrine. -Continue midodrine and monitor.  Morbid obesity. Body mass index is 55.36 kg/m.  -This will complicate overall management and prognosis.  Objective: Vitals:   04/15/20 0403 04/15/20 0817 04/15/20 1117  04/15/20 1538  BP:  (!) 125/52 135/78 (!) 145/61  Pulse:  75 70 74  Resp:  18 17 17   Temp: 98.8 F (37.1 C) 98.9 F (37.2 C) 98.4  F (36.9 C) 97.9 F (36.6 C)  TempSrc: Oral     SpO2:  97% 96% 94%  Weight:      Height:        Intake/Output Summary (Last 24 hours) at 04/15/2020 1643 Last data filed at 04/15/2020 1057 Gross per 24 hour  Intake 240 ml  Output 1600 ml  Net -1360 ml   Filed Weights   04/13/20 0427 04/14/20 0534 04/15/20 0400  Weight: (!) 151.6 kg (!) 152.6 kg (!) 150.9 kg    Examination:  General.  Morbidly obese lady, in no acute distress. Pulmonary.  Lungs clear bilaterally, normal respiratory effort. CV.  Regular rate and rhythm, no JVD, rub or murmur. Abdomen.  Soft, nontender, nondistended, BS positive. CNS.  Alert and oriented x3.  No focal neurologic deficit. Extremities.  Trace LE edema, no cyanosis, pulses intact and symmetrical. Psychiatry.  Judgment and insight appears normal.  DVT prophylaxis: Lovenox Code Status: Full Family Communication: Discussed with patient Disposition Plan:  Status is: Inpatient  Remains inpatient appropriate because:Inpatient level of care appropriate due to severity of illness   Dispo: The patient is from: Home              Anticipated d/c is to: SNF              Anticipated d/c date is: 1 day              Patient currently is medically stable for discharge.  Waiting for placement.  Consultants:   Cardiology  Pulmonology  Nephrology  Procedures:  Antimicrobials:  Keflex  Data Reviewed: I have personally reviewed following labs and imaging studies  CBC: Recent Labs  Lab 04/09/20 1303 04/09/20 1303 04/10/20 0629 04/11/20 0359 04/12/20 0501 04/13/20 0738 04/15/20 0523  WBC 11.8*   < > 12.6* 12.8* 11.1* 10.5 10.0  NEUTROABS 8.3*  --   --  9.8* 8.3* 7.8*  --   HGB 12.2   < > 10.8* 10.8* 10.8* 10.9* 11.1*  HCT 41.3   < > 36.0 34.4* 35.7* 37.6 37.4  MCV 97.2   < > 95.2 93.0 95.7 98.2 94.7  PLT 445*   < > 373 343 341 354 392   < > = values in this interval not displayed.   Basic Metabolic Panel: Recent Labs  Lab 04/10/20 0629  04/10/20 0629 04/11/20 0359 04/12/20 0501 04/13/20 0738 04/14/20 0314 04/15/20 0523  NA 140   < > 141 140 139 142 145  K 5.0   < > 4.6 4.6 4.6 4.7 4.4  CL 97*   < > 99 100 99 101 100  CO2 32   < > 30 32 32 33* 36*  GLUCOSE 80   < > 90 94 92 119* 80  BUN 63*   < > 67* 68* 55* 42* 28*  CREATININE 2.38*   < > 2.55* 1.98* 1.45* 1.14* 0.93  CALCIUM 8.3*   < > 7.9* 8.0* 9.0 8.9 9.3  MG 2.4  --   --   --   --   --   --    < > = values in this interval not displayed.   GFR: Estimated Creatinine Clearance: 88.9 mL/min (by C-G formula based on SCr of 0.93 mg/dL). Liver Function Tests: No results for input(s):  AST, ALT, ALKPHOS, BILITOT, PROT, ALBUMIN in the last 168 hours. No results for input(s): LIPASE, AMYLASE in the last 168 hours. No results for input(s): AMMONIA in the last 168 hours. Coagulation Profile: No results for input(s): INR, PROTIME in the last 168 hours. Cardiac Enzymes: No results for input(s): CKTOTAL, CKMB, CKMBINDEX, TROPONINI in the last 168 hours. BNP (last 3 results) No results for input(s): PROBNP in the last 8760 hours. HbA1C: No results for input(s): HGBA1C in the last 72 hours. CBG: Recent Labs  Lab 04/14/20 1159 04/14/20 1630 04/14/20 2006 04/15/20 0817 04/15/20 1227  GLUCAP 114* 118* 105* 92 97   Lipid Profile: No results for input(s): CHOL, HDL, LDLCALC, TRIG, CHOLHDL, LDLDIRECT in the last 72 hours. Thyroid Function Tests: No results for input(s): TSH, T4TOTAL, FREET4, T3FREE, THYROIDAB in the last 72 hours. Anemia Panel: No results for input(s): VITAMINB12, FOLATE, FERRITIN, TIBC, IRON, RETICCTPCT in the last 72 hours. Sepsis Labs: No results for input(s): PROCALCITON, LATICACIDVEN in the last 168 hours.  Recent Results (from the past 240 hour(s))  Respiratory Panel by RT PCR (Flu A&B, Covid) - Nasopharyngeal Swab     Status: None   Collection Time: 04/09/20  1:17 PM   Specimen: Nasopharyngeal Swab  Result Value Ref Range Status   SARS  Coronavirus 2 by RT PCR NEGATIVE NEGATIVE Final    Comment: (NOTE) SARS-CoV-2 target nucleic acids are NOT DETECTED.  The SARS-CoV-2 RNA is generally detectable in upper respiratoy specimens during the acute phase of infection. The lowest concentration of SARS-CoV-2 viral copies this assay can detect is 131 copies/mL. A negative result does not preclude SARS-Cov-2 infection and should not be used as the sole basis for treatment or other patient management decisions. A negative result may occur with  improper specimen collection/handling, submission of specimen other than nasopharyngeal swab, presence of viral mutation(s) within the areas targeted by this assay, and inadequate number of viral copies (<131 copies/mL). A negative result must be combined with clinical observations, patient history, and epidemiological information. The expected result is Negative.  Fact Sheet for Patients:  PinkCheek.be  Fact Sheet for Healthcare Providers:  GravelBags.it  This test is no t yet approved or cleared by the [place] FDA and  has been authorized for detection and/or diagnosis of SARS-CoV-2 by FDA under an Emergency Use Authorization (EUA). This EUA will remain  in effect (meaning this test can be used) for the duration of the COVID-19 declaration under Section 564(b)(1) of the Act, 21 U.S.C. section 360bbb-3(b)(1), unless the authorization is terminated or revoked sooner.     Influenza A by PCR NEGATIVE NEGATIVE Final   Influenza B by PCR NEGATIVE NEGATIVE Final    Comment: (NOTE) The Xpert Xpress SARS-CoV-2/FLU/RSV assay is intended as an aid in  the diagnosis of influenza from Nasopharyngeal swab specimens and  should not be used as a sole basis for treatment. Nasal washings and  aspirates are unacceptable for Xpert Xpress SARS-CoV-2/FLU/RSV  testing.  Fact Sheet for  Patients: PinkCheek.be  Fact Sheet for Healthcare Providers: GravelBags.it  This test is not yet approved or cleared by the [place] FDA and  has been authorized for detection and/or diagnosis of SARS-CoV-2 by  FDA under an Emergency Use Authorization (EUA). This EUA will remain  in effect (meaning this test can be used) for the duration of the  Covid-19 declaration under Section 564(b)(1) of the Act, 21  U.S.C. section 360bbb-3(b)(1), unless the authorization is  terminated or revoked. Performed at [facility], [address] [place], [place] [address]   Urine Culture     Status: Abnormal   Collection Time: 04/11/20  4:50 PM   Specimen: Urine, Random  Result Value Ref Range Status   Specimen Description   Final    URINE, RANDOM Performed at [facility], [address] [place], [place] [address]    Special Requests   Final    NONE Performed at [facility], [address] [place], [place] [address]    Culture >=100,000 COLONIES/mL ESCHERICHIA COLI (A)  Final   Report Status 04/14/2020 FINAL  Final   Organism ID, Bacteria ESCHERICHIA COLI (A)  Final      Susceptibility   Escherichia coli - MIC*    AMPICILLIN 4 SENSITIVE Sensitive     CEFAZOLIN <=4 SENSITIVE Sensitive     CEFTRIAXONE <=0.25 SENSITIVE Sensitive     CIPROFLOXACIN >=4 RESISTANT Resistant     GENTAMICIN <=1 SENSITIVE Sensitive     IMIPENEM <=0.25 SENSITIVE Sensitive     NITROFURANTOIN <=16 SENSITIVE Sensitive     TRIMETH/SULFA <=20 SENSITIVE Sensitive     AMPICILLIN/SULBACTAM <=2 SENSITIVE Sensitive     PIP/TAZO <=4 SENSITIVE Sensitive     * >=100,000 COLONIES/mL ESCHERICHIA COLI     Radiology Studies: NM Pulmonary Perfusion  Result Date: 04/15/2020 CLINICAL DATA:  Respiratory failure/shortness of breath. Elevated D-dimer EXAM: NUCLEAR MEDICINE PERFUSION LUNG SCAN TECHNIQUE: Perfusion images were obtained in  multiple projections after intravenous injection of radiopharmaceutical. Views: Anterior, posterior, left lateral, right lateral, RPO, LPO, RAO, LAO. RADIOPHARMACEUTICALS:  4.063 mCi Tc-93m MAA IV COMPARISON:  Chest radiograph April 15, 2020 FINDINGS: The distribution of radiotracer uptake bilaterally is homogeneous and symmetric without focal perfusion defect. There is cardiomegaly. IMPRESSION: No appreciable perfusion defects. No findings indicative of pulmonary embolus. Cardiomegaly noted. Electronically Signed   By: Lowella Grip III M.D.   On: 04/15/2020 13:03   DG Chest Port 1 View  Result Date: 04/15/2020 CLINICAL DATA:  64 year old female undergoing V/Q scan for respiratory failure. EXAM: PORTABLE CHEST 1 VIEW COMPARISON:  Portable chest 04/13/2020 and earlier. FINDINGS: Portable AP semi upright view at 1108 hours. Stable cardiomegaly and mediastinal contours. Mildly improved lung volumes. No pneumothorax, pleural effusion or consolidation. Bilateral pulmonary vascular congestion, although mildly regressed compared to 04/12/2020. Visualized tracheal air column is within normal limits. No acute osseous abnormality identified. IMPRESSION: Cardiomegaly with decreased but not resolved pulmonary interstitial edema since 04/12/2020. No new cardiopulmonary abnormality. Electronically Signed   By: Genevie Ann M.D.   On: 04/15/2020 11:38    Scheduled Meds: . aspirin EC  81 mg Oral Daily  . atorvastatin  20 mg Oral Q2200  . buPROPion  150 mg Oral Daily  . cephALEXin  250 mg Oral Q6H  . cholecalciferol  1,000 Units Oral Daily  . enoxaparin (LOVENOX) injection  40 mg Subcutaneous Q12H  . gabapentin  300 mg Oral QHS  . insulin aspart  0-5 Units Subcutaneous QHS  . insulin aspart  0-9 Units Subcutaneous TID WC  . levothyroxine  250 mcg Oral Q0600  . midodrine  10 mg Oral TID WC  . multivitamin with minerals  1 tablet Oral Daily  . rOPINIRole  2 mg Oral TID  . sodium chloride flush  3 mL Intravenous  Q12H  . zinc sulfate  220 mg Oral Daily   Continuous Infusions: . sodium chloride    . sodium chloride 250 mL (04/13/20 2314)     LOS: 6 days  Time spent: 25 minutes  Lorella Nimrod, MD [facility]  If 7PM-7AM, please contact night-coverage [email or web address]  04/15/2020, 4:43 PM   This record has been created using Systems analyst. Errors have been sought and corrected,but may not always be located. Such creation errors do not reflect on the standard of care.

## 2020-04-15 NOTE — Progress Notes (Signed)
Off Bipap @0135 . Back on O2 @ 4liters [name].

## 2020-04-15 NOTE — Progress Notes (Signed)
Physical Therapy Treatment Patient Details Name: Madison Mcbride MRN: [id number] DOB: 10-08-1955 Today's Date: 04/15/2020    History of Present Illness 64 y.o. female presented to ED wiht shortness of breath. Pt admitted with acute on chronic diastolic dysfunction congestive heart failure multifactorial in nature including acute on chronic kidney dysfunction and PMHx of CKD3, DM, HTN, HLD, and sleep apnea without evidence of myocardial infarction.     PT Comments    Patient agreeable to physical therapy. Patient continues to require significant assistance for bed mobility. Max A provided for trunk and BLE support. Verbal cues for technique. Unable to stand due to poor sitting tolerance and fatigue with minimal activity. Patient sat up on edge of bed for less than 3 minutes. Vitals monitored throughout with Sp02 decreasing for brief periods to 88% and quickly increasing to low 90's with rest breaks. Recommend to continue PT to maximize function and safety to facilitate independence. SNF remains appropriate discharge recommendation at this time.    Follow Up Recommendations  SNF     Equipment Recommendations  None recommended by PT    Recommendations for Other Services       Precautions / Restrictions Precautions Precautions: Fall Restrictions Weight Bearing Restrictions: No    Mobility  Bed Mobility Overal bed mobility: Needs Assistance Bed Mobility: Supine to Sit;Sit to Supine     Supine to sit: Max assist;HOB elevated Sit to supine: Max assist;HOB elevated   General bed mobility comments: assistance for BLE and trunk support. verbal cues for sequencing and technique. extra time required to complete tasks   Transfers                 General transfer comment: unable to attempt due to fatigue with activity. would recommend a lift for safe OOB activity at this time   Ambulation/Gait                 Stairs             Wheelchair Mobility     Modified Rankin (Stroke Patients Only)       Balance Overall balance assessment: Needs assistance Sitting-balance support: Feet supported;Bilateral upper extremity supported Sitting balance-Leahy Scale: Fair Sitting balance - Comments: patient using bed rail for support in sitting position with no gross loss of balance or trunk sway noted. sitting tolerance less than 3 minutes                                     Cognition Arousal/Alertness: Awake/alert   Overall Cognitive Status: Impaired/Different from baseline                                 General Comments: patient is able to follow single step commands with extra time       Exercises      General Comments General comments (skin integrity, edema, etc.): vitals monitored throughout session. Sp02 briefly decresed to 88% with activity.       Pertinent Vitals/Pain Pain Location: vague complaint of left flank pain     Home Living                      Prior Function            PT Goals (current goals can now be found in the care plan section) Acute Rehab  PT Goals Patient Stated Goal: to get better  PT Goal Formulation: With patient Time For Goal Achievement: 04/26/20 Potential to Achieve Goals: Fair Progress towards PT goals: Progressing toward goals    Frequency    Min 2X/week      PT Plan Current plan remains appropriate    Co-evaluation              AM-PAC PT "6 Clicks" Mobility   Outcome Measure  Help needed turning from your back to your side while in a flat bed without using bedrails?: Total Help needed moving from lying on your back to sitting on the side of a flat bed without using bedrails?: Total Help needed moving to and from a bed to a chair (including a wheelchair)?: Total Help needed standing up from a chair using your arms (e.g., wheelchair or bedside chair)?: Total Help needed to walk in hospital room?: Total Help needed climbing 3-5 steps  with a railing? : Total 6 Click Score: 6    End of Session Equipment Utilized During Treatment: Oxygen Activity Tolerance: Patient limited by fatigue     PT Visit Diagnosis: Muscle weakness (generalized) (M62.81);Difficulty in walking, not elsewhere classified (R26.2)     Time: 0258-5277 PT Time Calculation (min) (ACUTE ONLY): 31 min  Charges:  $Therapeutic Activity: 23-37 mins                    Minna Merritts, PT, MPT    Percell Locus 04/15/2020, 2:32 PM

## 2020-04-15 NOTE — TOC Progression Note (Signed)
Transition of Care [facility]) - Progression Note    Patient Details  Name: Madison Mcbride MRN: [id number] Date of Birth: 1956-06-11  Transition of Care [facility]) CM/SW [place], RN Phone Number: 04/15/2020, 2:47 PM  Clinical Narrative:     At this time, 4 facilities have declined SNF bed, called and left a message with Neoma Laming at [facility] to ask her to review and spoke to [place] at [facility] and he will review case.   Expected Discharge Plan: [place] Barriers to Discharge: Continued Medical Work up  Expected Discharge Plan and Services Expected Discharge Plan: [place] Choice: [place] arrangements for the past 2 months: Single Family Home                                       Social Determinants of Health (SDOH) Interventions    Readmission Risk Interventions No flowsheet data found.

## 2020-04-15 NOTE — Progress Notes (Signed)
Pulmonary Medicine          Date: 04/15/2020,   MRN# [id number] Madison Mcbride 1955/09/24     AdmissionWeight: (!) 148.8 kg                 CurrentWeight: (!) 150.9 kg  Referring physician: Dr. 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  She also reported associated symptoms of cough which was dry and nonproductive however denied chest pain flulike illness nausea vomiting or diarrhea.  On arrival she was found to be acutely hypoxemic at 65% SPO2 but this did respond to supplemental oxygen and she was later placed on BiPAP which made her feel better.  She was Covid negative on arrival, had a venous blood gas drawn which showed mild acidemia with hypercapnia and hypoxemia.  Pulmonary consultation was placed for additional evaluation of acute on chronic hypoxemic and hypercapnic respiratory failure.   04/13/20- Patient states she feels better, her LE edema is improved, abdomen still feels swollen per patient, breathing is improved.  10/3- patient evaluated at bedside, husband in room.  We discussed BiPAP and follow up on outpatient. She is on CPAP at home and knows how equipment and supplies work so it should be a fairly easy transition.   04/15/20- patient was unable to tolerate BIPAP. She states facemask interface was very uncomfortable and with pain around nasal bridge. She reports pressure felt too high and mask "kept blowing off my face".  Due to this she's tired/fatigues unable to sleep.    PAST MEDICAL HISTORY   Past Medical  History:  Diagnosis Date  . Diabetes mellitus without complication ([place])   . Hypothyroidism   . RLS (restless legs syndrome)   . Thyroid cancer ([place]) 2007     SURGICAL HISTORY   Past Surgical History:  Procedure Laterality Date  . ABDOMINAL HYSTERECTOMY    . COLONOSCOPY WITH PROPOFOL N/A 08/26/2015   Procedure: COLONOSCOPY WITH PROPOFOL;  Surgeon: Manya Silvas, MD;  Location: [facility] ENDOSCOPY;  Service: Endoscopy;  Laterality: N/A;  . THYROIDECTOMY       FAMILY HISTORY   Family History  Problem Relation Age of Onset  . Breast cancer Neg Hx      SOCIAL HISTORY   Social History   Tobacco Use  . Smoking status: Never Smoker  . Smokeless tobacco: Never Used  Substance Use Topics  . Alcohol use: No  . Drug use: No     MEDICATIONS    Home Medication:    Current Medication:  Current Facility-Administered Medications:  .  0.9 %  sodium chloride infusion, 250 mL, Intravenous, PRN, Ivor Costa, MD .  0.9 %  sodium chloride infusion, , Intravenous, PRN, Jennye Boroughs, MD, Last Rate: 10 mL/hr at 04/13/20 2314, 250 mL at 04/13/20 2314 .  acetaminophen (TYLENOL) tablet 650 mg, 650 mg, Oral, Q6H PRN, Jennye Boroughs, MD, 650 mg at 04/14/20 0245 .  albuterol (VENTOLIN HFA) 108 (90 Base) MCG/ACT inhaler 2 puff, 2 puff, Inhalation, Q4H PRN, Ivor Costa, MD .  aspirin EC tablet  81 mg, 81 mg, Oral, Daily, Ivor Costa, MD, 81 mg at 04/15/20 1230 .  atorvastatin (LIPITOR) tablet 20 mg, 20 mg, Oral, Q2200, Ivor Costa, MD, 20 mg at 04/14/20 2315 .  buPROPion (WELLBUTRIN SR) 12 hr tablet 150 mg, 150 mg, Oral, Daily, Ivor Costa, MD, 150 mg at 04/15/20 1231 .  cephALEXin (KEFLEX) capsule 250 mg, 250 mg, Oral, Q6H, Oswald Hillock, RPH .  cholecalciferol (VITAMIN D) tablet 1,000 Units, 1,000 Units, Oral, Daily, Ivor Costa, MD, 1,000 Units at 04/15/20 1230 .  dextromethorphan-guaiFENesin (MUCINEX DM) 30-600 MG per 12 hr tablet 1 tablet, 1 tablet, Oral, BID PRN, Ivor Costa, MD .   enoxaparin (LOVENOX) injection 40 mg, 40 mg, Subcutaneous, Q12H, Ivor Costa, MD, 40 mg at 04/15/20 1233 .  gabapentin (NEURONTIN) capsule 300 mg, 300 mg, Oral, QHS, Jennye Boroughs, MD, 300 mg at 04/14/20 2316 .  hydrALAZINE (APRESOLINE) injection 5 mg, 5 mg, Intravenous, Q2H PRN, Ivor Costa, MD .  hydrOXYzine (ATARAX/VISTARIL) tablet 10 mg, 10 mg, Oral, TID PRN, Ivor Costa, MD .  insulin aspart (novoLOG) injection 0-5 Units, 0-5 Units, Subcutaneous, QHS, Niu, Xilin, MD .  insulin aspart (novoLOG) injection 0-9 Units, 0-9 Units, Subcutaneous, TID WC, Ivor Costa, MD, 1 Units at 04/12/20 1756 .  levothyroxine (SYNTHROID) tablet 250 mcg, 250 mcg, Oral, Q0600, Ivor Costa, MD, 250 mcg at 04/14/20 0601 .  midodrine (PROAMATINE) tablet 10 mg, 10 mg, Oral, TID WC, Candiss Norse, [place], MD, 10 mg at 04/15/20 1229 .  multivitamin with minerals tablet 1 tablet, 1 tablet, Oral, Daily, Ivor Costa, MD, 1 tablet at 04/15/20 1229 .  oxyCODONE (Oxy IR/ROXICODONE) immediate release tablet 5 mg, 5 mg, Oral, Q8H PRN, Jennye Boroughs, MD, 5 mg at 04/12/20 1302 .  rOPINIRole (REQUIP) tablet 2 mg, 2 mg, Oral, TID, Jennye Boroughs, MD, 2 mg at 04/15/20 1229 .  sodium chloride flush (NS) 0.9 % injection 3 mL, 3 mL, Intravenous, Q12H, Ivor Costa, MD, 3 mL at 04/15/20 1233 .  sodium chloride flush (NS) 0.9 % injection 3 mL, 3 mL, Intravenous, PRN, Ivor Costa, MD, 3 mL at 04/09/20 2136 .  zinc sulfate capsule 220 mg, 220 mg, Oral, Daily, Ivor Costa, MD, 220 mg at 04/15/20 1229    ALLERGIES   Patient has no known allergies.     REVIEW OF SYSTEMS    Review of Systems:  Gen:  Denies  fever, sweats, chills weigh loss  HEENT: Denies blurred vision, double vision, ear pain, eye pain, hearing loss, nose bleeds, sore throat Cardiac:  No dizziness, chest pain or heaviness, chest tightness,edema Resp:   Denies cough or sputum porduction, shortness of breath,wheezing, hemoptysis,  Gi: Denies swallowing difficulty, stomach pain,  nausea or vomiting, diarrhea, constipation, bowel incontinence Gu:  Denies bladder incontinence, burning urine Ext:   Denies Joint pain, stiffness or swelling Skin: Denies  skin rash, easy bruising or bleeding or hives Endoc:  Denies polyuria, polydipsia , polyphagia or weight change Psych:   Denies depression, insomnia or hallucinations   Other:  All other systems negative   VS: BP 135/78 (BP Location: Right Arm)   Pulse 70   Temp 98.4 F (36.9 C)   Resp 17   Ht 5\' 5"  (1.651 m)   Wt (!) 150.9 kg   SpO2 96%   BMI 55.36 kg/m      PHYSICAL EXAM    GENERAL:NAD, no fevers, chills, no weakness no fatigue HEAD: Normocephalic, atraumatic.  EYES: Pupils equal, round, reactive to light. Extraocular muscles  intact. No scleral icterus.  MOUTH: Moist mucosal membrane. Dentition intact. No abscess noted.  EAR, NOSE, THROAT: Clear without exudates. No external lesions.  NECK: Supple. No thyromegaly. No nodules. No JVD.  PULMONARY: Decreased breath sounds bilaterally without wheezing or rhonchorous lung sounds CARDIOVASCULAR: S1 and S2. Regular rate and rhythm. No murmurs, rubs, or gallops. No edema. Pedal pulses 2+ bilaterally.  GASTROINTESTINAL: Soft, nontender, nondistended. No masses. Positive bowel sounds. No hepatosplenomegaly.  Obese abdomen MUSCULOSKELETAL: No swelling, clubbing, or edema. Range of motion full in all extremities.  NEUROLOGIC: Cranial nerves II through XII are intact. No gross focal neurological deficits. Sensation intact. Reflexes intact.  SKIN: No ulceration, lesions, rashes, or cyanosis. Skin warm and dry. Turgor intact.  PSYCHIATRIC: Mood, affect within normal limits. The patient is awake, alert and oriented x 3. Insight, judgment intact.       IMAGING    NM Pulmonary Perfusion  Result Date: 04/15/2020 CLINICAL DATA:  Respiratory failure/shortness of breath. Elevated D-dimer EXAM: NUCLEAR MEDICINE PERFUSION LUNG SCAN TECHNIQUE: Perfusion images were  obtained in multiple projections after intravenous injection of radiopharmaceutical. Views: Anterior, posterior, left lateral, right lateral, RPO, LPO, RAO, LAO. RADIOPHARMACEUTICALS:  4.063 mCi Tc-54m MAA IV COMPARISON:  Chest radiograph April 15, 2020 FINDINGS: The distribution of radiotracer uptake bilaterally is homogeneous and symmetric without focal perfusion defect. There is cardiomegaly. IMPRESSION: No appreciable perfusion defects. No findings indicative of pulmonary embolus. Cardiomegaly noted. Electronically Signed   By: Lowella Grip III M.D.   On: 04/15/2020 13:03   US RENAL  Result Date: 04/11/2020 CLINICAL DATA:  Acute renal failure EXAM: RENAL / URINARY TRACT ULTRASOUND COMPLETE COMPARISON:  Renal ultrasound 10/22/2019 FINDINGS: Right Kidney: Renal measurements: 10.8 x 6.6 x 6.2 cm = volume: 233 mL. Echogenicity is within normal limits. Mild cortical thinning and renal sinus lipomatosis. Simple appearing 1.4 x 1.3 x 1.3 cm cyst arising from the upper pole right kidney. No concerning renal mass, shadowing calculus or hydronephrosis. Left Kidney: Renal measurements: 13.5 x 7.2 x 6.4 cm = volume: 323.4 mL. Echogenicity is within normal limits. Mild cortical thinning and renal sinus lipomatosis. No concerning renal mass, shadowing calculus or hydronephrosis. Bladder: Appears normal for degree of bladder distention. Other: None. IMPRESSION: Kidneys appear slightly enlarged bilaterally (upper limits normal for females 150 mL). With some chronic renal cortical thinning and renal sinus lipomatosis, can reflect a degree of chronic renal failure or diabetic nephropathy but with preservation of the parenchymal echogenicity arguing against a more acute medical renal disease. Simple 1.4 cm cyst in the upper pole right kidney. Electronically Signed   By: Lovena Le M.D.   On: 04/11/2020 19:19   DG Chest Port 1 View  Result Date: 04/15/2020 CLINICAL DATA:  64 year old female undergoing V/Q scan for  respiratory failure. EXAM: PORTABLE CHEST 1 VIEW COMPARISON:  Portable chest 04/13/2020 and earlier. FINDINGS: Portable AP semi upright view at 1108 hours. Stable cardiomegaly and mediastinal contours. Mildly improved lung volumes. No pneumothorax, pleural effusion or consolidation. Bilateral pulmonary vascular congestion, although mildly regressed compared to 04/12/2020. Visualized tracheal air column is within normal limits. No acute osseous abnormality identified. IMPRESSION: Cardiomegaly with decreased but not resolved pulmonary interstitial edema since 04/12/2020. No new cardiopulmonary abnormality. Electronically Signed   By: Genevie Ann M.D.   On: 04/15/2020 11:38   DG Chest Port 1 View  Result Date: 04/13/2020 CLINICAL DATA:  Acute on chronic CHF, thyroid cancer EXAM: PORTABLE CHEST 1 VIEW COMPARISON:  Chest radiograph from one day prior. FINDINGS:  Stable cardiomediastinal silhouette with mild to moderate cardiomegaly. No pneumothorax. No pleural effusion. Mild pulmonary edema, similar. IMPRESSION: Mild congestive heart failure, similar. Electronically Signed   By: Ilona Sorrel M.D.   On: 04/13/2020 09:29   DG Chest Port 1 View  Result Date: 04/12/2020 CLINICAL DATA:  Short of breath.  Cough EXAM: PORTABLE CHEST 1 VIEW COMPARISON:  04/01/2020 FINDINGS: Enlarged cardiac silhouette with ectatic aorta. There is patchy bilateral airspace disease. No focal consolidation. No pneumothorax. Mild improvement in airspace opacities. IMPRESSION: Patchy bilateral airspace disease suggests pulmonary edema. Mild improvement from comparison exam. Electronically Signed   By: Suzy Bouchard M.D.   On: 04/12/2020 10:40   DG Chest Portable 1 View  Result Date: 04/09/2020 CLINICAL DATA:  Shortness of breath EXAM: PORTABLE CHEST 1 VIEW COMPARISON:  February 21, 2020 FINDINGS: There is cardiomegaly with pulmonary venous hypertension. There is mild interstitial edema. No consolidation. No adenopathy. No bone lesions.  IMPRESSION: Cardiomegaly with pulmonary vascular congestion. Mild interstitial edema. Overall appearance is felt to be indicative of congestive heart failure. No consolidation. Electronically Signed   By: Lowella Grip III M.D.   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 -Likely due to thoracic restriction with obesity hypoventilation syndrome and obstructive sleep apnea overlap -Would like to perform spirometry with graph to evaluate residual lung function and stratification of ventilatory defect -Arterial blood gas to allow patient for BiPAP qualification for home use -Consultation with [facility] for medical equipment and supplies  -Patient would benefit from bariatric evaluation -Complicated by fluid shifts secondary to heart failure and renal impairment -Would benefit from occupational and physical therapy -D-dimer to rule out pulmonary venous thromboembolism -RD nutritional evaluation for appropriate dietary changes with morbid obesity , hypothyroid and DM2    Obstructive sleep apnea  - patient uses CPAP at home at 19cm H2O  - BIPAP while in house QHS   Thank you for allowing me to participate in the care of this patient.   Patient/Family are satisfied with care plan and all questions have been answered.   This document was prepared using Dragon voice recognition software and may include unintentional dictation errors.     Ottie Glazier, M.D.  Division of [place]

## 2020-04-15 NOTE — Progress Notes (Signed)
OT Cancellation Note  Patient Details Name: Dorsey Charette MRN: [id number] DOB: 1955/10/18   Cancelled Treatment:    Reason Eval/Treat Not Completed: Patient at procedure or test/ unavailable. Pt out of room upon attempt for OT tx. Will re-attempt as schedule permits and as pt is available and medically appropriate.   Jeni Salles, MPH, MS, OTR/L ascom [phone] 04/15/20, 11:56 AM

## 2020-04-16 ENCOUNTER — Ambulatory Visit: Payer: BC Managed Care – PPO | Admitting: Family

## 2020-04-16 LAB — GLUCOSE, CAPILLARY
Glucose-Capillary: 107 mg/dL — ABNORMAL HIGH (ref 70–99)
Glucose-Capillary: 108 mg/dL — ABNORMAL HIGH (ref 70–99)
Glucose-Capillary: 110 mg/dL — ABNORMAL HIGH (ref 70–99)
Glucose-Capillary: 93 mg/dL (ref 70–99)

## 2020-04-16 MED ORDER — GABAPENTIN 300 MG PO CAPS
300.0000 mg | ORAL_CAPSULE | Freq: Two times a day (BID) | ORAL | Status: DC
  Administered 2020-04-16 – 2020-04-18 (×4): 300 mg via ORAL
  Filled 2020-04-16 (×4): qty 1

## 2020-04-16 MED ORDER — FUROSEMIDE 10 MG/ML IJ SOLN
20.0000 mg | Freq: Two times a day (BID) | INTRAMUSCULAR | Status: DC
  Administered 2020-04-16 (×2): 20 mg via INTRAVENOUS
  Filled 2020-04-16 (×2): qty 2

## 2020-04-16 NOTE — Progress Notes (Signed)

## 2020-04-16 NOTE — Progress Notes (Signed)
PROGRESS NOTE    [name]  YQI:[id number] DOB: 1956/06/03 DOA: 04/09/2020 PCP: Rusty Aus, MD   Brief Narrative:  Madison Mcbride is a 64 y.o. female with medical history significant ofdCHF,hyperlipidemia, diabetes mellitus, hypothyroidism, depression, thyroid cancer, morbid obesity, chronic back pain, OSA on CPAP, CKD-IIIa.  She presented to the hospital because of progressively worsening shortness of breath. Her oxygen saturation was low around 65% on room air.  She was admitted to the hospital for acute exacerbation of chronic diastolic CHF and acute hypoxemic respiratory failure.  Initially, she was treated with BiPAP and was subsequently transitioned to oxygen via nasal cannula.  ABG shows chronic hypercapnia and patient likely has obesity hypoventilation syndrome in addition to her obstructive sleep apnea.  She was treated with IV Lasix but she developed acute kidney injury so IV Lasix was held.  She was also started on midodrine for low blood pressure.  Nephrologist was consulted to assist with management of AKI with underlying CKD stage IIIa.  Cardiologist was also consulted to assist with management of CHF.  She was evaluated by the pulmonologist and patient will be a candidate for home BiPAP/trilogy machine for management of chronic hypoxemic and hypercapnic respiratory failure and OSA.  She developed acute change in mental status, lethargy and confusion).  This occurred few hours after she received oxycodone for pain.  She also had abnormal urinalysis and urine culture showed E. coli.  It was not clear whether encephalopathy was from oxycodone or from UTI.  She was treated with empiric IV Rocephin.  Subjective: Patient was sitting in chair when seen during morning rounds today.  No new complaint.  Per patient she rested good overnight.  Assessment & Plan:   Principal Problem:   Acute on chronic diastolic CHF (congestive heart failure) (HCC) Active  Problems:   Hypothyroidism   Acute respiratory failure with hypoxia and hypercapnia (HCC)   Acute renal failure superimposed on stage 3a chronic kidney disease (HCC)   Diabetes mellitus without complication (HCC)   HLD (hyperlipidemia)   Depression   Elevated troponin   OSA (obstructive sleep apnea)  Acute hypoxic and hypercapnic respiratory failure.  Initially requiring BiPAP, not transition to [place], saturating well on 4L, decreased to 2 with no significant change in saturation.  Concern of obesity hypoventilation syndrome along with sleep apnea.  D-dimer was checked  which was elevated at 5333. VQ scan was without any perfusion defect. -Continue BiPAP at nighttime. -Pulmonary is following-appreciate their recommendations. -They are recommending BiPAP at night instead of CPAP at home. -Low-dose Lasix was again added by pulmonary today.  UTI.  Urine culture with E. coli, good sensitivity.  He was given ceftriaxone initially which was transitioned to Keflex to complete a 5-day course.  HFpEF.  There was some concern of CHF exacerbation and she was started on IV Lasix, resulted in AKI and diuretics were discontinued. -Lasix was again restarted at 20 mg twice daily today. -Daily BMPs.  Hypothyroidism. -Continue home dose of Synthroid.  Chronic pain syndrome.  -Continue home dose of oxycodone, ropinirole and gabapentin.  AKI.  Resolved.  Most likely secondary to diuresis. Renal ultrasound with cortical thinning, no hydronephrosis and a suspected left nephrolithiasis. -Continue to monitor  Hypotension.  Blood pressure within goal with midodrine. -Continue midodrine and monitor.  Morbid obesity. Body mass index is 54.4 kg/m.  -This will complicate overall management and prognosis.  Objective: Vitals:   04/16/20 0551 04/16/20 0818 04/16/20 1133 04/16/20 1550  BP:  126/68 [name])  131/48 117/65  Pulse:  78 69 76  Resp:  18 20 18   Temp:  99.6 F (37.6 C) 99.3 F (37.4 C) 98.1 F  (36.7 C)  TempSrc:  Oral Oral Oral  SpO2:  93% 93% 97%  Weight: (!) 148.3 kg     Height:        Intake/Output Summary (Last 24 hours) at 04/16/2020 1707 Last data filed at 04/16/2020 1552 Gross per 24 hour  Intake 1100 ml  Output 1100 ml  Net 0 ml   Filed Weights   04/14/20 0534 04/15/20 0400 04/16/20 0551  Weight: (!) 152.6 kg (!) 150.9 kg (!) 148.3 kg    Examination:  General.  Morbidly obese lady, sitting in chair, in no acute distress. Pulmonary.  Lungs clear bilaterally, normal respiratory effort. CV.  Regular rate and rhythm, no JVD, rub or murmur. Abdomen.  Soft, nontender, nondistended, BS positive. CNS.  Alert and oriented x3.  No focal neurologic deficit. Extremities.  Race LE edema, no cyanosis, pulses intact and symmetrical. Psychiatry.  Judgment and insight appears normal.  DVT prophylaxis: Lovenox Code Status: Full Family Communication: Updated the husband on phone. Disposition Plan:  Status is: Inpatient  Remains inpatient appropriate because:Inpatient level of care appropriate due to severity of illness   Dispo: The patient is from: Home              Anticipated d/c is to: SNF              Anticipated d/c date is: 1 day              Patient currently is medically stable for discharge.  Waiting for placement.  Consultants:   Cardiology  Pulmonology  Nephrology  Procedures:  Antimicrobials:   Data Reviewed: I have personally reviewed following labs and imaging studies  CBC: Recent Labs  Lab 04/10/20 0629 04/11/20 0359 04/12/20 0501 04/13/20 0738 04/15/20 0523  WBC 12.6* 12.8* 11.1* 10.5 10.0  NEUTROABS  --  9.8* 8.3* 7.8*  --   HGB 10.8* 10.8* 10.8* 10.9* 11.1*  HCT 36.0 34.4* 35.7* 37.6 37.4  MCV 95.2 93.0 95.7 98.2 94.7  PLT 373 343 341 354 045   Basic Metabolic Panel: Recent Labs  Lab 04/10/20 0629 04/10/20 0629 04/11/20 0359 04/12/20 0501 04/13/20 0738 04/14/20 0314 04/15/20 0523  NA 140   < > 141 140 139 142 145  K  5.0   < > 4.6 4.6 4.6 4.7 4.4  CL 97*   < > 99 100 99 101 100  CO2 32   < > 30 32 32 33* 36*  GLUCOSE 80   < > 90 94 92 119* 80  BUN 63*   < > 67* 68* 55* 42* 28*  CREATININE 2.38*   < > 2.55* 1.98* 1.45* 1.14* 0.93  CALCIUM 8.3*   < > 7.9* 8.0* 9.0 8.9 9.3  MG 2.4  --   --   --   --   --   --    < > = values in this interval not displayed.   GFR: Estimated Creatinine Clearance: 87.8 mL/min (by C-G formula based on SCr of 0.93 mg/dL). Liver Function Tests: No results for input(s): AST, ALT, ALKPHOS, BILITOT, PROT, ALBUMIN in the last 168 hours. No results for input(s): LIPASE, AMYLASE in the last 168 hours. No results for input(s): AMMONIA in the last 168 hours. Coagulation Profile: No results for input(s): INR, PROTIME in the last 168 hours. Cardiac Enzymes: No results  for input(s): CKTOTAL, CKMB, CKMBINDEX, TROPONINI in the last 168 hours. BNP (last 3 results) No results for input(s): PROBNP in the last 8760 hours. HbA1C: No results for input(s): HGBA1C in the last 72 hours. CBG: Recent Labs  Lab 04/15/20 1642 04/15/20 2040 04/16/20 0819 04/16/20 1137 04/16/20 1554  GLUCAP 102* 102* 107* 108* 110*   Lipid Profile: No results for input(s): CHOL, HDL, LDLCALC, TRIG, CHOLHDL, LDLDIRECT in the last 72 hours. Thyroid Function Tests: No results for input(s): TSH, T4TOTAL, FREET4, T3FREE, THYROIDAB in the last 72 hours. Anemia Panel: No results for input(s): VITAMINB12, FOLATE, FERRITIN, TIBC, IRON, RETICCTPCT in the last 72 hours. Sepsis Labs: No results for input(s): PROCALCITON, LATICACIDVEN in the last 168 hours.  Recent Results (from the past 240 hour(s))  Respiratory Panel by RT PCR (Flu A&B, Covid) - Nasopharyngeal Swab     Status: None   Collection Time: 04/09/20  1:17 PM   Specimen: Nasopharyngeal Swab  Result Value Ref Range Status   SARS Coronavirus 2 by RT PCR NEGATIVE NEGATIVE Final    Comment: (NOTE) SARS-CoV-2 target nucleic acids are NOT DETECTED.  The  SARS-CoV-2 RNA is generally detectable in upper respiratoy specimens during the acute phase of infection. The lowest concentration of SARS-CoV-2 viral copies this assay can detect is 131 copies/mL. A negative result does not preclude SARS-Cov-2 infection and should not be used as the sole basis for treatment or other patient management decisions. A negative result may occur with  improper specimen collection/handling, submission of specimen other than nasopharyngeal swab, presence of viral mutation(s) within the areas targeted by this assay, and inadequate number of viral copies (<131 copies/mL). A negative result must be combined with clinical observations, patient history, and epidemiological information. The expected result is Negative.  Fact Sheet for Patients:  PinkCheek.be  Fact Sheet for Healthcare Providers:  GravelBags.it  This test is no t yet approved or cleared by the [place] FDA and  has been authorized for detection and/or diagnosis of SARS-CoV-2 by FDA under an Emergency Use Authorization (EUA). This EUA will remain  in effect (meaning this test can be used) for the duration of the COVID-19 declaration under Section 564(b)(1) of the Act, 21 U.S.C. section 360bbb-3(b)(1), unless the authorization is terminated or revoked sooner.     Influenza A by PCR NEGATIVE NEGATIVE Final   Influenza B by PCR NEGATIVE NEGATIVE Final    Comment: (NOTE) The Xpert Xpress SARS-CoV-2/FLU/RSV assay is intended as an aid in  the diagnosis of influenza from Nasopharyngeal swab specimens and  should not be used as a sole basis for treatment. Nasal washings and  aspirates are unacceptable for Xpert Xpress SARS-CoV-2/FLU/RSV  testing.  Fact Sheet for Patients: PinkCheek.be  Fact Sheet for Healthcare Providers: GravelBags.it  This test is not yet approved or cleared  by the [place] FDA and  has been authorized for detection and/or diagnosis of SARS-CoV-2 by  FDA under an Emergency Use Authorization (EUA). This EUA will remain  in effect (meaning this test can be used) for the duration of the  Covid-19 declaration under Section 564(b)(1) of the Act, 21  U.S.C. section 360bbb-3(b)(1), unless the authorization is  terminated or revoked. Performed at [facility], [address], [place], [place] [address]   Urine Culture     Status: Abnormal   Collection Time: 04/11/20  4:50 PM   Specimen: Urine, Random  Result Value Ref Range Status   Specimen Description   Final    URINE,  RANDOM Performed at [facility], [address] [place], [place] [address]    Special Requests   Final    NONE Performed at [facility], [address] [place], [place] [address]    Culture >=100,000 COLONIES/mL ESCHERICHIA COLI (A)  Final   Report Status 04/14/2020 FINAL  Final   Organism ID, Bacteria ESCHERICHIA COLI (A)  Final      Susceptibility   Escherichia coli - MIC*    AMPICILLIN 4 SENSITIVE Sensitive     CEFAZOLIN <=4 SENSITIVE Sensitive     CEFTRIAXONE <=0.25 SENSITIVE Sensitive     CIPROFLOXACIN >=4 RESISTANT Resistant     GENTAMICIN <=1 SENSITIVE Sensitive     IMIPENEM <=0.25 SENSITIVE Sensitive     NITROFURANTOIN <=16 SENSITIVE Sensitive     TRIMETH/SULFA <=20 SENSITIVE Sensitive     AMPICILLIN/SULBACTAM <=2 SENSITIVE Sensitive     PIP/TAZO <=4 SENSITIVE Sensitive     * >=100,000 COLONIES/mL ESCHERICHIA COLI     Radiology Studies: NM Pulmonary Perfusion  Result Date: 04/15/2020 CLINICAL DATA:  Respiratory failure/shortness of breath. Elevated D-dimer EXAM: NUCLEAR MEDICINE PERFUSION LUNG SCAN TECHNIQUE: Perfusion images were obtained in multiple projections after intravenous injection of radiopharmaceutical. Views: Anterior, posterior, left lateral, right lateral, RPO, LPO, RAO, LAO. RADIOPHARMACEUTICALS:  4.063 mCi  Tc-35m MAA IV COMPARISON:  Chest radiograph April 15, 2020 FINDINGS: The distribution of radiotracer uptake bilaterally is homogeneous and symmetric without focal perfusion defect. There is cardiomegaly. IMPRESSION: No appreciable perfusion defects. No findings indicative of pulmonary embolus. Cardiomegaly noted. Electronically Signed   By: Lowella Grip III M.D.   On: 04/15/2020 13:03   DG Chest Port 1 View  Result Date: 04/15/2020 CLINICAL DATA:  64 year old female undergoing V/Q scan for respiratory failure. EXAM: PORTABLE CHEST 1 VIEW COMPARISON:  Portable chest 04/13/2020 and earlier. FINDINGS: Portable AP semi upright view at 1108 hours. Stable cardiomegaly and mediastinal contours. Mildly improved lung volumes. No pneumothorax, pleural effusion or consolidation. Bilateral pulmonary vascular congestion, although mildly regressed compared to 04/12/2020. Visualized tracheal air column is within normal limits. No acute osseous abnormality identified. IMPRESSION: Cardiomegaly with decreased but not resolved pulmonary interstitial edema since 04/12/2020. No new cardiopulmonary abnormality. Electronically Signed   By: Genevie Ann M.D.   On: 04/15/2020 11:38    Scheduled Meds:  aspirin EC  81 mg Oral Daily   atorvastatin  20 mg Oral Q2200   buPROPion  150 mg Oral Daily   cephALEXin  250 mg Oral Q6H   cholecalciferol  1,000 Units Oral Daily   enoxaparin (LOVENOX) injection  40 mg Subcutaneous Q12H   furosemide  20 mg Intravenous Q12H   gabapentin  300 mg Oral QHS   insulin aspart  0-5 Units Subcutaneous QHS   insulin aspart  0-9 Units Subcutaneous TID WC   levothyroxine  250 mcg Oral Q0600   midodrine  10 mg Oral TID WC   multivitamin with minerals  1 tablet Oral Daily   rOPINIRole  2 mg Oral TID   sodium chloride flush  3 mL Intravenous Q12H   zinc sulfate  220 mg Oral Daily   Continuous Infusions:  sodium chloride       LOS: 7 days   Time spent: 25 minutes  Lorella Nimrod, MD [facility]  If 7PM-7AM, please contact night-coverage [email or web address]  04/16/2020, 5:07 PM   This record has been created using Systems analyst. Errors have been sought and corrected,but may not always be located. Such creation errors  do not reflect on the standard of care.

## 2020-04-16 NOTE — Progress Notes (Addendum)
OT Cancellation Note  Patient Details Name: Madison Mcbride MRN: [id number] DOB: 1956/05/14   Cancelled Treatment:    Reason Eval/Treat Not Completed: Other (comment) . Pt seated in recliner upon attempt. Requesting a bath and return to bed after she uses the Purewick to urinate. Nurse tech unavailable to assist with return to bed at this time. Will re-attempt at later time as schedule permits.   Addendum: On 2nd attempt, pt back to bed, recently bathed. Nasal canula noted to be off. Pt unable to verbalize why or how long it had been off. Bruin replaced and cues provided for PLB. O2 sats initially 80% on room air, improving to 90% with PLB. Pt coughing and requests cough medication. RN notified. Will hold OT tx this date. Will re-attempt OT tx next date as appropriate.    Jeni Salles, MPH, MS, OTR/L ascom [phone] 04/16/20, 1:43 PM

## 2020-04-16 NOTE — Progress Notes (Signed)
Physical Therapy Treatment Patient Details Name: Madison Mcbride MRN: [id number] DOB: 04-22-1956 Today's Date: 04/16/2020    History of Present Illness 64 y.o. female presented to ED wiht shortness of breath. Pt admitted with acute on chronic diastolic dysfunction congestive heart failure multifactorial in nature including acute on chronic kidney dysfunction and PMHx of CKD3, DM, HTN, HLD, and sleep apnea without evidence of myocardial infarction.     PT Comments    Patient received in bed, agrees to PT session. She reports back pain. Requires max assist for supine to sit. Max assist for scooting hips forward to edge of bed. Max +2 assist for sit to stand, requiring 2 attempts to get up with cues for hand placement and upright posture. She is able to take a few steps from bed to recliner with mod assist to advance RW and cues for sequencing. She will continue to benefit from skilled PT to improve strength and functional mobility.        Follow Up Recommendations  SNF;Supervision/Assistance - 24 hour     Equipment Recommendations  None recommended by PT    Recommendations for Other Services       Precautions / Restrictions Precautions Precautions: Fall Restrictions Weight Bearing Restrictions: No    Mobility  Bed Mobility Overal bed mobility: Needs Assistance Bed Mobility: Supine to Sit     Supine to sit: Max assist;HOB elevated        Transfers Overall transfer level: Needs assistance Equipment used: Rolling walker (2 wheeled);Left platform walker Transfers: Sit to/from Stand Sit to Stand: From elevated surface;Max assist;+2 physical assistance         General transfer comment: Required 2 attempts to get to standing from elevated bed. Requires +2 max assist.  Ambulation/Gait Ambulation/Gait assistance: Mod assist;+2 physical assistance Gait Distance (Feet): 3 Feet Assistive device: Rolling walker (2 wheeled);Left platform walker Gait Pattern/deviations:  Step-to pattern;Shuffle;Decreased step length - right;Decreased step length - left;Trunk flexed Gait velocity: decreased   General Gait Details: patient able to take a few steps from bed to recliner with increased time and effort. Cues needed for sequencing.   Stairs             Wheelchair Mobility    Modified Rankin (Stroke Patients Only)       Balance Overall balance assessment: Needs assistance Sitting-balance support: Feet supported;Bilateral upper extremity supported Sitting balance-Leahy Scale: Fair Sitting balance - Comments: patient using bed rail for support in sitting position with no gross loss of balance or trunk sway noted.   Standing balance support: Bilateral upper extremity supported;During functional activity Standing balance-Leahy Scale: Poor Standing balance comment: requires heavy UE support on RW and +2 assist for standing and taking a few steps                            Cognition Arousal/Alertness: Awake/alert Behavior During Therapy: WFL for tasks assessed/performed Overall Cognitive Status: Within Functional Limits for tasks assessed                                        Exercises      General Comments        Pertinent Vitals/Pain Pain Assessment: Faces Faces Pain Scale: Hurts a little bit Pain Location: back Pain Descriptors / Indicators: Sore;Discomfort Pain Intervention(s): Monitored during session;Repositioned    Home Living  Prior Function            PT Goals (current goals can now be found in the care plan section) Acute Rehab PT Goals Patient Stated Goal: to get better  PT Goal Formulation: With patient Time For Goal Achievement: 04/26/20 Potential to Achieve Goals: Fair Progress towards PT goals: Progressing toward goals    Frequency    Min 2X/week      PT Plan Current plan remains appropriate    Co-evaluation              AM-PAC PT "6 Clicks"  Mobility   Outcome Measure  Help needed turning from your back to your side while in a flat bed without using bedrails?: A Lot Help needed moving from lying on your back to sitting on the side of a flat bed without using bedrails?: A Lot Help needed moving to and from a bed to a chair (including a wheelchair)?: A Lot Help needed standing up from a chair using your arms (e.g., wheelchair or bedside chair)?: A Lot Help needed to walk in hospital room?: A Lot Help needed climbing 3-5 steps with a railing? : Total 6 Click Score: 11    End of Session Equipment Utilized During Treatment: Gait belt;Oxygen Activity Tolerance: Patient limited by fatigue Patient left: in chair;with call bell/phone within reach Nurse Communication: Mobility status PT Visit Diagnosis: Muscle weakness (generalized) (M62.81);Difficulty in walking, not elsewhere classified (R26.2);Other abnormalities of gait and mobility (R26.89) Pain - part of body:  (back)     Time: 1030-1057 PT Time Calculation (min) (ACUTE ONLY): 27 min  Charges:  $Gait Training: 8-22 mins $Therapeutic Activity: 8-22 mins                     Zackerie Sara, PT, GCS 04/16/20,11:08 AM

## 2020-04-16 NOTE — Progress Notes (Signed)
Pulmonary Medicine          Date: 04/16/2020,   MRN# [id number] Madison Mcbride April 16, 1956     AdmissionWeight: (!) 148.8 kg                 CurrentWeight: (!) 148.3 kg  Referring physician: Dr. 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  She also reported associated symptoms of cough which was dry and nonproductive however denied chest pain flulike illness nausea vomiting or diarrhea.  On arrival she was found to be acutely hypoxemic at 65% SPO2 but this did respond to supplemental oxygen and she was later placed on BiPAP which made her feel better.  She was Covid negative on arrival, had a venous blood gas drawn which showed mild acidemia with hypercapnia and hypoxemia.  Pulmonary consultation was placed for additional evaluation of acute on chronic hypoxemic and hypercapnic respiratory failure.   04/13/20- Patient states she feels better, her LE edema is improved, abdomen still feels swollen per patient, breathing is improved.  10/3- patient evaluated at bedside, husband in room.  We discussed BiPAP and follow up on outpatient. She is on CPAP at home and knows how equipment and supplies work so it should be a fairly easy transition.   04/15/20- patient was unable to tolerate BIPAP. She states facemask interface was very uncomfortable and with pain around nasal bridge. She reports pressure felt too high and mask "kept blowing off my face".  Due to this she's tired/fatigues unable to sleep.   04/16/20- patient participating with PT/OT  doing better with very slight destaturations during therapy.  She had VQ negative for PE.  She has residual interstitial infiltrates with edema bilaterally, will re-institute diuresis. Noted patient on TID midodrine 10. Continue current scope of therapy.  Would recommend bariatric evaluation.    PAST MEDICAL HISTORY   Past Medical History:  Diagnosis Date  . Diabetes mellitus without complication ([place])   . Hypothyroidism   . RLS (restless legs syndrome)   . Thyroid cancer ([place]) 2007     SURGICAL HISTORY   Past Surgical History:  Procedure Laterality Date  . ABDOMINAL HYSTERECTOMY    . COLONOSCOPY WITH PROPOFOL N/A 08/26/2015   Procedure: COLONOSCOPY WITH PROPOFOL;  Surgeon: Manya Silvas, MD;  Location: [facility] ENDOSCOPY;  Service: Endoscopy;  Laterality: N/A;  . THYROIDECTOMY       FAMILY HISTORY   Family History  Problem Relation Age of Onset  . Breast cancer Neg Hx      SOCIAL HISTORY   Social History   Tobacco Use  . Smoking status: Never Smoker  . Smokeless tobacco: Never Used  Substance Use Topics  . Alcohol use: No  . Drug use: No     MEDICATIONS    Home Medication:    Current Medication:  Current Facility-Administered Medications:  .  0.9 %  sodium chloride infusion, 250 mL, Intravenous, PRN, Ivor Costa, MD .  acetaminophen (TYLENOL) tablet 650 mg, 650 mg, Oral, Q6H PRN, Jennye Boroughs, MD, 650 mg at 04/14/20 0245 .  albuterol (VENTOLIN HFA) 108 (90 Base) MCG/ACT inhaler 2 puff, 2 puff, Inhalation, Q4H PRN, Ivor Costa, MD .  aspirin EC tablet 81 mg, 81 mg, Oral, Daily, Ivor Costa, MD, 81 mg at 04/16/20 1014 .  atorvastatin (LIPITOR) tablet 20 mg, 20 mg, Oral, Q2200, Ivor Costa, MD, 20 mg at 04/15/20 2042 .  buPROPion (WELLBUTRIN SR) 12 hr tablet 150 mg, 150 mg, Oral, Daily, Ivor Costa, MD, 150 mg at 04/16/20 1014 .  cephALEXin (KEFLEX) capsule 250 mg, 250 mg, Oral, Q6H, Oswald Hillock, RPH, 250 mg at 04/16/20 0601 .  cholecalciferol (VITAMIN D)  tablet 1,000 Units, 1,000 Units, Oral, Daily, Ivor Costa, MD, 1,000 Units at 04/16/20 1014 .  dextromethorphan-guaiFENesin ([place] DM) 30-600 MG per 12 hr tablet 1 tablet, 1 tablet, Oral, BID PRN, Ivor Costa, MD .  enoxaparin (LOVENOX) injection 40 mg, 40 mg, Subcutaneous, Q12H, Ivor Costa, MD, 40 mg at 04/16/20 1015 .  furosemide (LASIX) injection 20 mg, 20 mg, Intravenous, Q12H, Ceirra Belli, MD .  gabapentin (NEURONTIN) capsule 300 mg, 300 mg, Oral, QHS, Jennye Boroughs, MD, 300 mg at 04/15/20 2042 .  hydrALAZINE (APRESOLINE) injection 5 mg, 5 mg, Intravenous, Q2H PRN, Ivor Costa, MD .  hydrOXYzine (ATARAX/VISTARIL) tablet 10 mg, 10 mg, Oral, TID PRN, Ivor Costa, MD .  insulin aspart (novoLOG) injection 0-5 Units, 0-5 Units, Subcutaneous, QHS, Niu, Xilin, MD .  insulin aspart (novoLOG) injection 0-9 Units, 0-9 Units, Subcutaneous, TID WC, Ivor Costa, MD, 1 Units at 04/12/20 1756 .  levothyroxine (SYNTHROID) tablet 250 mcg, 250 mcg, Oral, Q0600, Ivor Costa, MD, 250 mcg at 04/16/20 0600 .  midodrine (PROAMATINE) tablet 10 mg, 10 mg, Oral, TID WC, Singh, Harmeet, MD, 10 mg at 04/16/20 1014 .  multivitamin with minerals tablet 1 tablet, 1 tablet, Oral, Daily, Ivor Costa, MD, 1 tablet at 04/16/20 1014 .  oxyCODONE (Oxy IR/ROXICODONE) immediate release tablet 5 mg, 5 mg, Oral, Q8H PRN, Jennye Boroughs, MD, 5 mg at 04/12/20 1302 .  rOPINIRole (REQUIP) tablet 2 mg, 2 mg, Oral, TID, Jennye Boroughs, MD, 2 mg at 04/16/20 1014 .  sodium chloride flush (NS) 0.9 % injection 3 mL, 3 mL, Intravenous, Q12H, Ivor Costa, MD, 3 mL at 04/16/20 1015 .  sodium chloride flush (NS) 0.9 % injection 3 mL, 3 mL, Intravenous, PRN, Ivor Costa, MD, 3 mL at 04/09/20 2136 .  zinc sulfate capsule 220 mg, 220 mg, Oral, Daily, Ivor Costa, MD, 220 mg at 04/16/20 1014    ALLERGIES   Patient has no known allergies.     REVIEW OF SYSTEMS    Review of Systems:  Gen:  Denies  fever, sweats, chills weigh loss  HEENT:  Denies blurred vision, double vision, ear pain, eye pain, hearing loss, nose bleeds, sore throat Cardiac:  No dizziness, chest pain or heaviness, chest tightness,edema Resp:   Denies cough or sputum porduction, shortness of breath,wheezing, hemoptysis,  Gi: Denies swallowing difficulty, stomach pain, nausea or vomiting, diarrhea, constipation, bowel incontinence Gu:  Denies bladder incontinence, burning urine Ext:   Denies Joint pain, stiffness or swelling Skin: Denies  skin rash, easy bruising or bleeding or hives Endoc:  Denies polyuria, polydipsia , polyphagia or weight change Psych:   Denies depression, insomnia or hallucinations   Other:  All other systems negative   VS: BP 126/68 (BP Location: Right Arm)   Pulse 78   Temp 99.6 F (37.6 C) (Oral)   Resp 18   Ht 5\' 5"  (1.651 m)   Wt [name])  148.3 kg   SpO2 93%   BMI 54.40 kg/m      PHYSICAL EXAM    GENERAL:NAD, no fevers, chills, no weakness no fatigue HEAD: Normocephalic, atraumatic.  EYES: Pupils equal, round, reactive to light. Extraocular muscles intact. No scleral icterus.  MOUTH: Moist mucosal membrane. Dentition intact. No abscess noted.  EAR, NOSE, THROAT: Clear without exudates. No external lesions.  NECK: Supple. No thyromegaly. No nodules. No JVD.  PULMONARY: Decreased breath sounds bilaterally without wheezing or rhonchorous lung sounds CARDIOVASCULAR: S1 and S2. Regular rate and rhythm. No murmurs, rubs, or gallops. No edema. Pedal pulses 2+ bilaterally.  GASTROINTESTINAL: Soft, nontender, nondistended. No masses. Positive bowel sounds. No hepatosplenomegaly.  Obese abdomen MUSCULOSKELETAL: No swelling, clubbing, or edema. Range of motion full in all extremities.  NEUROLOGIC: Cranial nerves II through XII are intact. No gross focal neurological deficits. Sensation intact. Reflexes intact.  SKIN: No ulceration, lesions, rashes, or cyanosis. Skin warm and dry. Turgor intact.  PSYCHIATRIC: Mood, affect within normal  limits. The patient is awake, alert and oriented x 3. Insight, judgment intact.       IMAGING    NM Pulmonary Perfusion  Result Date: 04/15/2020 CLINICAL DATA:  Respiratory failure/shortness of breath. Elevated D-dimer EXAM: NUCLEAR MEDICINE PERFUSION LUNG SCAN TECHNIQUE: Perfusion images were obtained in multiple projections after intravenous injection of radiopharmaceutical. Views: Anterior, posterior, left lateral, right lateral, RPO, LPO, RAO, LAO. RADIOPHARMACEUTICALS:  4.063 mCi Tc-54m MAA IV COMPARISON:  Chest radiograph April 15, 2020 FINDINGS: The distribution of radiotracer uptake bilaterally is homogeneous and symmetric without focal perfusion defect. There is cardiomegaly. IMPRESSION: No appreciable perfusion defects. No findings indicative of pulmonary embolus. Cardiomegaly noted. Electronically Signed   By: Lowella Grip III M.D.   On: 04/15/2020 13:03   US RENAL  Result Date: 04/11/2020 CLINICAL DATA:  Acute renal failure EXAM: RENAL / URINARY TRACT ULTRASOUND COMPLETE COMPARISON:  Renal ultrasound 10/22/2019 FINDINGS: Right Kidney: Renal measurements: 10.8 x 6.6 x 6.2 cm = volume: 233 mL. Echogenicity is within normal limits. Mild cortical thinning and renal sinus lipomatosis. Simple appearing 1.4 x 1.3 x 1.3 cm cyst arising from the upper pole right kidney. No concerning renal mass, shadowing calculus or hydronephrosis. Left Kidney: Renal measurements: 13.5 x 7.2 x 6.4 cm = volume: 323.4 mL. Echogenicity is within normal limits. Mild cortical thinning and renal sinus lipomatosis. No concerning renal mass, shadowing calculus or hydronephrosis. Bladder: Appears normal for degree of bladder distention. Other: None. IMPRESSION: Kidneys appear slightly enlarged bilaterally (upper limits normal for females 150 mL). With some chronic renal cortical thinning and renal sinus lipomatosis, can reflect a degree of chronic renal failure or diabetic nephropathy but with preservation of the  parenchymal echogenicity arguing against a more acute medical renal disease. Simple 1.4 cm cyst in the upper pole right kidney. Electronically Signed   By: Lovena Le M.D.   On: 04/11/2020 19:19   DG Chest Port 1 View  Result Date: 04/15/2020 CLINICAL DATA:  64 year old female undergoing V/Q scan for respiratory failure. EXAM: PORTABLE CHEST 1 VIEW COMPARISON:  Portable chest 04/13/2020 and earlier. FINDINGS: Portable AP semi upright view at 1108 hours. Stable cardiomegaly and mediastinal contours. Mildly improved lung volumes. No pneumothorax, pleural effusion or consolidation. Bilateral pulmonary vascular congestion, although mildly regressed compared to 04/12/2020. Visualized tracheal air column is within normal limits. No acute osseous abnormality identified. IMPRESSION: Cardiomegaly with decreased but not resolved pulmonary interstitial edema since 04/12/2020. No new cardiopulmonary abnormality. Electronically Signed   By: Lemmie Evens  [place] Crane M.D.   On: 04/15/2020 11:38   DG Chest Port 1 View  Result Date: 04/13/2020 CLINICAL DATA:  Acute on chronic CHF, thyroid cancer EXAM: PORTABLE CHEST 1 VIEW COMPARISON:  Chest radiograph from one day prior. FINDINGS: Stable cardiomediastinal silhouette with mild to moderate cardiomegaly. No pneumothorax. No pleural effusion. Mild pulmonary edema, similar. IMPRESSION: Mild congestive heart failure, similar. Electronically Signed   By: Ilona Sorrel M.D.   On: 04/13/2020 09:29   DG Chest Port 1 View  Result Date: 04/12/2020 CLINICAL DATA:  Short of breath.  Cough EXAM: PORTABLE CHEST 1 VIEW COMPARISON:  04/01/2020 FINDINGS: Enlarged cardiac silhouette with ectatic aorta. There is patchy bilateral airspace disease. No focal consolidation. No pneumothorax. Mild improvement in airspace opacities. IMPRESSION: Patchy bilateral airspace disease suggests pulmonary edema. Mild improvement from comparison exam. Electronically Signed   By: Suzy Bouchard M.D.   On: 04/12/2020 10:40    DG Chest Portable 1 View  Result Date: 04/09/2020 CLINICAL DATA:  Shortness of breath EXAM: PORTABLE CHEST 1 VIEW COMPARISON:  February 21, 2020 FINDINGS: There is cardiomegaly with pulmonary venous hypertension. There is mild interstitial edema. No consolidation. No adenopathy. No bone lesions. IMPRESSION: Cardiomegaly with pulmonary vascular congestion. Mild interstitial edema. Overall appearance is felt to be indicative of congestive heart failure. No consolidation. Electronically Signed   By: Lowella Grip III M.D.   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 -Likely due to thoracic restriction with obesity hypoventilation syndrome and obstructive sleep apnea overlap -Would like to perform spirometry with graph to evaluate residual lung function and stratification of ventilatory defect -reviewed Arterial blood gas to allow patient for BiPAP qualification for home use -Consultation with [facility] for medical equipment and supplies-BIPAP is being utilized   -Patient would benefit from bariatric evaluation -Complicated by fluid shifts secondary to heart failure and renal impairment -Would benefit from occupational and physical therapy -D-dimer to rule out pulmonary venous thromboembolism-VQ negative -RD nutritional evaluation for appropriate dietary changes with morbid obesity , hypothyroid and DM2, as well as bariatric evaluation.     Obstructive sleep apnea  - patient uses CPAP at home at 19cm H2O  - BIPAP while in house QHS   Thank you for allowing me to participate in the care of this patient.   Patient/Family are satisfied with care plan and all questions have been answered.   This document was prepared using Dragon voice recognition software and may include unintentional dictation errors.     Ottie Glazier, M.D.  Division of [place]

## 2020-04-16 NOTE — TOC Progression Note (Signed)
Transition of Care [facility]) - Progression Note    Patient Details  Name: Shatara Stanek MRN: [id number] Date of Birth: Feb 24, 1956  Transition [facility]) CM/SW [place], RN Phone Number: 04/16/2020, 1:16 PM  Clinical Narrative:     Spoke with patient and husband [name])  Patient referred me to call her husband.  Explained no bed offers at this time.  Asked for permission to widen bed search to [facility], husband reluctantly agreed.  He feels she requires Short term rehab.  Bed Search widened.  Patient also reported he asked the nurse to ask MD to call.  He has not heard back yet, I reassured husband I would send a message to Dr Reesa Chew. Spoke with Dr Reesa Chew, who reports she will talk with husband.     Expected Discharge Plan: [place] Barriers to Discharge: Continued Medical Work up  Expected Discharge Plan and Services Expected Discharge Plan: [place] Choice: [place] arrangements for the past 2 months: Single Family Home                                       Social Determinants of Health (SDOH) Interventions    Readmission Risk Interventions No flowsheet data found.

## 2020-04-17 ENCOUNTER — Inpatient Hospital Stay: Payer: BC Managed Care – PPO

## 2020-04-17 LAB — BASIC METABOLIC PANEL
Anion gap: 11 (ref 5–15)
BUN: 22 mg/dL (ref 8–23)
CO2: 34 mmol/L — ABNORMAL HIGH (ref 22–32)
Calcium: 9.1 mg/dL (ref 8.9–10.3)
Chloride: 96 mmol/L — ABNORMAL LOW (ref 98–111)
Creatinine, Ser: 1.06 mg/dL — ABNORMAL HIGH (ref 0.44–1.00)
GFR calc non Af Amer: 55 mL/min — ABNORMAL LOW (ref >60)
Glucose, Bld: 86 mg/dL (ref 70–99)
Potassium: 4.1 mmol/L (ref 3.5–5.1)
Sodium: 141 mmol/L (ref 135–145)

## 2020-04-17 LAB — GLUCOSE, CAPILLARY
Glucose-Capillary: 83 mg/dL (ref 70–99)
Glucose-Capillary: 113 mg/dL — ABNORMAL HIGH (ref 70–99)
Glucose-Capillary: 118 mg/dL — ABNORMAL HIGH (ref 70–99)
Glucose-Capillary: 102 mg/dL — ABNORMAL HIGH (ref 70–99)

## 2020-04-17 LAB — SARS CORONAVIRUS 2 BY RT PCR (HOSPITAL ORDER, PERFORMED IN ~~LOC~~ HOSPITAL LAB): SARS Coronavirus 2: NEGATIVE

## 2020-04-17 MED ORDER — FUROSEMIDE 40 MG PO TABS
40.0000 mg | ORAL_TABLET | Freq: Every day | ORAL | Status: DC
  Administered 2020-04-17 – 2020-04-18 (×2): 40 mg via ORAL
  Filled 2020-04-17 (×2): qty 1

## 2020-04-17 MED ORDER — HYDROCOD POLST-CPM POLST ER 10-8 MG/5ML PO SUER
5.0000 mL | Freq: Three times a day (TID) | ORAL | Status: DC
  Administered 2020-04-17 – 2020-04-18 (×4): 5 mL via ORAL
  Filled 2020-04-17 (×4): qty 5

## 2020-04-17 MED ORDER — MIDODRINE HCL 5 MG PO TABS: 10.0000 mg | ORAL_TABLET | Freq: Three times a day (TID) | ORAL | Status: DC | PRN

## 2020-04-17 NOTE — Plan of Care (Signed)

## 2020-04-17 NOTE — Progress Notes (Signed)
  Heart Failure Nurse Navigator Note  HFpEF 50 -55%  She presented from home with complaints of worsening SOB for 3 days.  02 saturations of 65% on room air.  Also noted dry cough.  Co morbidities:  OSA Morbid Obesity CKD stage 3 Diabetes Hypertension Hyperlipidemia  Medications:  ASA 81mg  Lipitor 20 mg daily Lasix 40 mg po daily  Labs:  Sodium 141, potassium 4.1, BUN 22, creatinine 1.06,  Weight-145.7 kg (148.3) Intake 840 ml Output 1250 ml  Assessment:  General -lying in bed, coughing-productive of white sputum.  HEENT-normocephalic, unable to assess JVD due to body habitus.  Cardiac- heart tones are regular , no rubs or murmur  Chest- clear to anterior auscultation.  GI- abdomen rounded and soft, + bowel sounds  Musculoskeletal-skin discoloration Significant for venous insuffiencey.  Neuro- speech is clear  Psych- is pleasant and appropriate      Spent time with patient discussing what to report to doctor when she is at home.  Also discussed the importance of daily weights and what to report to doctor.  She says the BIPAP is still uncomfortable, but is getting used to it.  Discussed the importance of its use.    Madison Riffle RN,CHFN

## 2020-04-17 NOTE — Progress Notes (Signed)
Occupational Therapy Treatment Patient Details Name: Madison Mcbride MRN: [id number] DOB: January 03, 1956 Today's Date: 04/17/2020    History of present illness 64 y.o. female presented to ED wiht shortness of breath. Pt admitted with acute on chronic diastolic dysfunction congestive heart failure multifactorial in nature including acute on chronic kidney dysfunction and PMHx of CKD3, DM, HTN, HLD, and sleep apnea without evidence of myocardial infarction.    OT comments  Pt seen for OT tx this date to f/u re: safety with ADLs/ADL mobility. OT facilitates ed re: importance of OOB activity for skin integrirty, prevention of further muscle atrophy and to improve lung function. Pt with good understanding. OT engages pt in sup to sit transition with MAX A, pt sits x~18-19 mins while OT engages pt in UB bathing/dressing and grooming with MIN A. Pt then tolerates MOD/MAX A +2 for SIT to STAND from elevated surface and tolerates ~4-5 mins while MOD A provided by therapist for standing balance and CNA performs MAX to TOTAL A posterior peri care as pt with episiode of bowel incontinence on coming to stand. Pt requires MAX A to get back to bed. Left on 3L, on clean bedding, CNA stays to take VS. All needs met and in reach. NOTE: spO2 briefly decreases to 87-88% on 2Lnc with transition to sitting, increased to 3Lnc and with seated rest break, resumes to ~91-93%.   Follow Up Recommendations  SNF    Equipment Recommendations  None recommended by OT    Recommendations for Other Services      Precautions / Restrictions Precautions Precautions: Fall Restrictions Weight Bearing Restrictions: No       Mobility Bed Mobility Overal bed mobility: Needs Assistance Bed Mobility: Supine to Sit     Supine to sit: Max assist;HOB elevated Sit to supine: Max assist;HOB elevated      Transfers Overall transfer level: Needs assistance Equipment used: Rolling walker (2 wheeled);Left platform  walker Transfers: Sit to/from Stand Sit to Stand: Mod assist;Max assist;+2 physical assistance;From elevated surface         General transfer comment: requires extended time to CTS, requires starting with B UEs up on walker ( L UE on platform d/t carpal tunnel hx and R UE up on walker to pull up as pt does not feel she can push). Pt then requires assist once in standing to support while adjusting R UE from front of walker to actual hand rest on R side.    Balance Overall balance assessment: Needs assistance Sitting-balance support: Feet supported;Bilateral upper extremity supported Sitting balance-Leahy Scale: Fair     Standing balance support: Bilateral upper extremity supported;During functional activity Standing balance-Leahy Scale: Poor Standing balance comment: extensive assist to maintain static standing balance and heavy reliance on B UE support. Tolerates static stand ~4-5 mins while MAX/TOTAL A peri care performed and bed chucks changed.                           ADL either performed or assessed with clinical judgement   ADL Overall ADL's : Needs assistance/impaired         Upper Body Bathing: Moderate assistance;Sitting   Lower Body Bathing: Maximal assistance;Total assistance;Sit to/from stand Lower Body Bathing Details (indicate cue type and reason): MOD A to sustain static stand with platform RW and second person MAX A to complete actual posterior peri care as pt with episode of bowel incontinence upon standing. Upper Body Dressing : Minimal assistance;Moderate assistance;Bed level  Vision Patient Visual Report: No change from baseline     Perception     Praxis      Cognition Arousal/Alertness: Awake/alert Behavior During Therapy: WFL for tasks assessed/performed Overall Cognitive Status: Within Functional Limits for tasks assessed                                          Exercises Other  Exercises Other Exercises: OT facilitates ed re: importance of OOB activity for skin integrirty, prevention of further muscle atrophy and to improve lung function. Pt with good understanding. OT engages pt in sup to sit transition with MAX A, pt sits x~18-19 mins while OT engages pt in UB bathing/dressing and grooming with MIN A. Pt then tolerates MOD/MAX A +2 for SIT to STAND from elevated surface and tolerates ~4-5 mins while MOD A provided by therapist for standing balance and CNA performs MAX to TOTAL A posterior peri care as pt with episiode of bowel incontinence on coming to stand. Pt requires MAX A to get back to bed. Left on 3L, on clean bedding, CNA stays to take VS. All needs met and in reach.   Shoulder Instructions       General Comments spO2 briefly decreases to 87-88% on 2Lnc with transition to sitting, increased to 3Lnc and with seated rest break, resumes to ~91-93%.    Pertinent Vitals/ Pain       Pain Assessment: Faces Faces Pain Scale: Hurts a little bit Pain Location: back Pain Descriptors / Indicators: Sore;Discomfort Pain Intervention(s): Limited activity within patient's tolerance;Repositioned  Home Living                                          Prior Functioning/Environment              Frequency  Min 2X/week        Progress Toward Goals  OT Goals(current goals can now be found in the care plan section)  Progress towards OT goals: Progressing toward goals  Acute Rehab OT Goals Patient Stated Goal: to get better  OT Goal Formulation: With patient/family Time For Goal Achievement: 04/25/20 Potential to Achieve Goals: Good  Plan Discharge plan remains appropriate    Co-evaluation                 AM-PAC OT "6 Clicks" Daily Activity     Outcome Measure   Help from another person eating meals?: None Help from another person taking care of personal grooming?: A Little Help from another person toileting, which includes using  toliet, bedpan, or urinal?: Total Help from another person bathing (including washing, rinsing, drying)?: A Lot Help from another person to put on and taking off regular upper body clothing?: A Little Help from another person to put on and taking off regular lower body clothing?: A Lot 6 Click Score: 15    End of Session Equipment Utilized During Treatment: Oxygen  OT Visit Diagnosis: Other abnormalities of gait and mobility (R26.89);Muscle weakness (generalized) (M62.81);History of falling (Z91.81);Pain Pain - Right/Left:  (back)   Activity Tolerance Patient limited by pain   Patient Left in bed;with call bell/phone within reach;with bed alarm set (CNA present taking VS)   Nurse Communication          Time: 7096-2836 OT Time  Calculation (min): 43 min  Charges: OT General Charges $OT Visit: 1 Visit OT Treatments $Self Care/Home Management : 23-37 mins $Therapeutic Activity: 8-22 mins  Gerrianne Scale, MS, OTR/L ascom [phone] 04/17/20, 4:54 PM

## 2020-04-17 NOTE — Progress Notes (Signed)
Pulmonary Medicine          Date: 04/17/2020,   MRN# [id number] Madison Mcbride 10-30-1955     AdmissionWeight: (!) 148.8 kg                 CurrentWeight: (!) 145.7 kg  Referring physician: Dr. 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  She also reported associated symptoms of cough which was dry and nonproductive however denied chest pain flulike illness nausea vomiting or diarrhea.  On arrival she was found to be acutely hypoxemic at 65% SPO2 but this did respond to supplemental oxygen and she was later placed on BiPAP which made her feel better.  She was Covid negative on arrival, had a venous blood gas drawn which showed mild acidemia with hypercapnia and hypoxemia.  Pulmonary consultation was placed for additional evaluation of acute on chronic hypoxemic and hypercapnic respiratory failure.   04/13/20- Patient states she feels better, her LE edema is improved, abdomen still feels swollen per patient, breathing is improved.  10/3- patient evaluated at bedside, husband in room.  We discussed BiPAP and follow up on outpatient. She is on CPAP at home and knows how equipment and supplies work so it should be a fairly easy transition.   04/15/20- patient was unable to tolerate BIPAP. She states facemask interface was very uncomfortable and with pain around nasal bridge. She reports pressure felt too high and mask "kept blowing off my face".  Due to this she's tired/fatigues unable to sleep.   04/16/20- patient participating with PT/OT  doing better with very slight destaturations during therapy.  She had VQ negative for PE.  She has residual interstitial infiltrates with edema bilaterally, will re-institute diuresis. Noted patient on TID midodrine 10. Continue current scope of therapy.  Would recommend bariatric evaluation.  04/17/20- patient was able to tolerate BIPAP a little better. She complains of cough we discussed initionting tussinex while hospitalized.  CXR this am with persistent pulmonary edema interval improvement.    PAST MEDICAL HISTORY   Past Medical History:  Diagnosis Date  . Diabetes mellitus without complication ([name])   . Hypothyroidism   . RLS (restless legs syndrome)   . Thyroid cancer ([name]) 2007     SURGICAL HISTORY   Past Surgical History:  Procedure Laterality Date  . ABDOMINAL HYSTERECTOMY    . COLONOSCOPY WITH PROPOFOL N/A 08/26/2015   Procedure: COLONOSCOPY WITH PROPOFOL;  Surgeon: Manya Silvas, MD;  Location: [facility] ENDOSCOPY;  Service: Endoscopy;  Laterality: N/A;  . THYROIDECTOMY       FAMILY HISTORY   Family History  Problem Relation Age of Onset  . Breast cancer Neg Hx      SOCIAL HISTORY   Social History   Tobacco Use  . Smoking status: Never Smoker  . Smokeless tobacco: Never Used  Substance Use Topics  . Alcohol use: No  . Drug use: No     MEDICATIONS    Home Medication:    Current Medication:  Current Facility-Administered Medications:  .  0.9 %  sodium chloride infusion, 250 mL, Intravenous, PRN, Ivor Costa, MD .  acetaminophen (TYLENOL) tablet 650 mg, 650 mg, Oral, Q6H PRN, Jennye Boroughs, MD, 650 mg at 04/14/20 0245 .  albuterol (VENTOLIN HFA) 108 (90 Base) MCG/ACT inhaler 2 puff, 2 puff, Inhalation, Q4H PRN, Ivor Costa, MD .  aspirin EC tablet 81 mg, 81 mg, Oral, Daily, Ivor Costa, MD, 81 mg at 04/17/20 1007 .  atorvastatin (LIPITOR) tablet 20 mg, 20 mg, Oral, Q2200, Ivor Costa, MD, 20 mg at 04/16/20 2240 .  buPROPion (WELLBUTRIN SR) 12 hr tablet  150 mg, 150 mg, Oral, Daily, Ivor Costa, MD, 150 mg at 04/17/20 1008 .  chlorpheniramine-HYDROcodone (TUSSIONEX) 10-8 MG/5ML suspension 5 mL, 5 mL, Oral, Q8H, Cynara Tatham, MD, 5 mL at 04/17/20 1007 .  cholecalciferol (VITAMIN D) tablet 1,000 Units, 1,000 Units, Oral, Daily, Ivor Costa, MD, 1,000 Units at 04/17/20 1007 .  dextromethorphan-guaiFENesin (MUCINEX DM) 30-600 MG per 12 hr tablet 1 tablet, 1 tablet, Oral, BID PRN, Ivor Costa, MD, 1 tablet at 04/16/20 1728 .  enoxaparin (LOVENOX) injection 40 mg, 40 mg, Subcutaneous, Q12H, Ivor Costa, MD, 40 mg at 04/17/20 1007 .  furosemide (LASIX) tablet 40 mg, 40 mg, Oral, Daily, Lorella Nimrod, MD, 40 mg at 04/17/20 1006 .  gabapentin (NEURONTIN) capsule 300 mg, 300 mg, Oral, BID, Lorella Nimrod, MD, 300 mg at 04/17/20 1009 .  hydrALAZINE (APRESOLINE) injection 5 mg, 5 mg, Intravenous, Q2H PRN, Ivor Costa, MD .  hydrOXYzine (ATARAX/VISTARIL) tablet 10 mg, 10 mg, Oral, TID PRN, Ivor Costa, MD .  insulin aspart (novoLOG) injection 0-5 Units, 0-5 Units, Subcutaneous, QHS, Niu, Xilin, MD .  insulin aspart (novoLOG) injection 0-9 Units, 0-9 Units, Subcutaneous, TID WC, Ivor Costa, MD, 1 Units at 04/12/20 1756 .  levothyroxine (SYNTHROID) tablet 250 mcg, 250 mcg, Oral, Q0600, Ivor Costa, MD, 250 mcg at 04/17/20 0651 .  midodrine (PROAMATINE) tablet 10 mg, 10 mg, Oral, TID WC PRN, Lorella Nimrod, MD .  multivitamin with minerals tablet 1 tablet, 1 tablet, Oral, Daily, Ivor Costa, MD, 1 tablet at 04/17/20 1007 .  oxyCODONE (Oxy IR/ROXICODONE) immediate release tablet 5 mg, 5 mg, Oral, Q8H PRN, Jennye Boroughs, MD, 5 mg at 04/12/20 1302 .  rOPINIRole (REQUIP) tablet 2 mg, 2 mg, Oral, TID, Jennye Boroughs, MD, 2 mg at 04/17/20 1007 .  sodium chloride flush (NS) 0.9 % injection 3 mL, 3 mL, Intravenous, Q12H, Ivor Costa, MD, 3 mL at 04/17/20 1008 .  sodium chloride flush (NS) 0.9 % injection 3 mL, 3 mL, Intravenous, PRN, Ivor Costa, MD, 3 mL at 04/09/20 2136 .  zinc  sulfate capsule 220 mg, 220 mg, Oral, Daily, Ivor Costa, MD, 220 mg at 04/17/20 1008    ALLERGIES   Patient has no known allergies.     REVIEW OF SYSTEMS    Review of Systems:  Gen:  Denies  fever, sweats, chills weigh loss  HEENT: Denies blurred vision, double vision, ear pain, eye pain, hearing loss, nose bleeds, sore throat Cardiac:  No dizziness, chest pain or heaviness, chest tightness,edema Resp:   Denies cough or sputum porduction, shortness of breath,wheezing, hemoptysis,  Gi: Denies swallowing difficulty, stomach pain, nausea or vomiting, diarrhea, constipation, bowel incontinence Gu:  Denies bladder incontinence, burning urine Ext:   Denies Joint pain, stiffness or swelling Skin: Denies  skin rash, easy bruising or bleeding or hives Endoc:  Denies polyuria, polydipsia , polyphagia or weight change Psych:   Denies depression, insomnia or hallucinations   Other:  All other systems negative   VS: BP 137/69 (BP Location: Right Arm)   Pulse 78   Temp 98.6 F (37 C) (Oral)   Resp 17   Ht 5\' 5"  (1.651 m)   Wt (!) 145.7 kg   SpO2 98%   BMI 53.43 kg/m      PHYSICAL EXAM    GENERAL:NAD, no fevers, chills, no weakness no fatigue HEAD: Normocephalic, atraumatic.  EYES: Pupils equal, round, reactive to light. Extraocular muscles intact. No scleral icterus.  MOUTH: Moist mucosal membrane. Dentition intact. No abscess noted.  EAR, NOSE, THROAT: Clear without exudates. No external lesions.  NECK: Supple. No thyromegaly. No nodules. No JVD.  PULMONARY: Decreased breath sounds bilaterally without wheezing or rhonchorous lung sounds CARDIOVASCULAR: S1 and S2. Regular rate and rhythm. No murmurs, rubs, or gallops. No edema. Pedal pulses 2+ bilaterally.  GASTROINTESTINAL: Soft, nontender, nondistended. No masses. Positive bowel sounds. No hepatosplenomegaly.  Obese abdomen MUSCULOSKELETAL: No swelling, clubbing, or edema. Range of motion full in all extremities.    NEUROLOGIC: Cranial nerves II through XII are intact. No gross focal neurological deficits. Sensation intact. Reflexes intact.  SKIN: No ulceration, lesions, rashes, or cyanosis. Skin warm and dry. Turgor intact.  PSYCHIATRIC: Mood, affect within normal limits. The patient is awake, alert and oriented x 3. Insight, judgment intact.       IMAGING    NM Pulmonary Perfusion  Result Date: 04/15/2020 CLINICAL DATA:  Respiratory failure/shortness of breath. Elevated D-dimer EXAM: NUCLEAR MEDICINE PERFUSION LUNG SCAN TECHNIQUE: Perfusion images were obtained in multiple projections after intravenous injection of radiopharmaceutical. Views: Anterior, posterior, left lateral, right lateral, RPO, LPO, RAO, LAO. RADIOPHARMACEUTICALS:  4.063 mCi Tc-72m MAA IV COMPARISON:  Chest radiograph April 15, 2020 FINDINGS: The distribution of radiotracer uptake bilaterally is homogeneous and symmetric without focal perfusion defect. There is cardiomegaly. IMPRESSION: No appreciable perfusion defects. No findings indicative of pulmonary embolus. Cardiomegaly noted. Electronically Signed   By: Lowella Grip III M.D.   On: 04/15/2020 13:03   US RENAL  Result Date: 04/11/2020 CLINICAL DATA:  Acute renal failure EXAM: RENAL / URINARY TRACT ULTRASOUND COMPLETE COMPARISON:  Renal ultrasound 10/22/2019 FINDINGS: Right Kidney: Renal measurements: 10.8 x 6.6 x 6.2 cm = volume: 233 mL. Echogenicity is within normal limits. Mild cortical thinning and renal sinus lipomatosis. Simple appearing 1.4 x 1.3 x 1.3 cm cyst arising from the upper pole right kidney. No concerning renal mass, shadowing calculus or hydronephrosis. Left Kidney: Renal measurements: 13.5 x 7.2 x 6.4 cm = volume: 323.4 mL. Echogenicity is within normal limits. Mild cortical thinning and renal sinus lipomatosis. No concerning renal mass, shadowing calculus or hydronephrosis. Bladder: Appears normal for degree of bladder distention. Other: None. IMPRESSION:  Kidneys appear slightly enlarged bilaterally (upper limits normal for females 150 mL). With some chronic renal cortical thinning and renal sinus lipomatosis, can reflect a degree of chronic renal failure or diabetic nephropathy but with preservation of the parenchymal echogenicity arguing against a more acute medical renal disease. Simple 1.4 cm cyst in the upper pole right kidney. Electronically Signed   By: Lovena Le M.D.   On: 04/11/2020 19:19   DG Chest Port 1 View  Result Date: 04/17/2020 CLINICAL DATA:  Congestive heart failure EXAM: PORTABLE CHEST 1 VIEW COMPARISON:  04/15/2020 FINDINGS: Stable cardiomegaly. Pulmonary vascular congestion with increased interstitial opacities bilaterally. No large pleural fluid collection. No pneumothorax. IMPRESSION: Findings suggestive of CHF with pulmonary edema. No appreciable interval change from prior. Electronically Signed   By:  Nicholas  Plundo D.O.   On: 04/17/2020 08:17   DG Chest Port 1 View  Result Date: 04/15/2020 CLINICAL DATA:  64 year old female undergoing V/Q scan for respiratory failure. EXAM: PORTABLE CHEST 1 VIEW COMPARISON:  Portable chest 04/13/2020 and earlier. FINDINGS: Portable AP semi upright view at 1108 hours. Stable cardiomegaly and mediastinal contours. Mildly improved lung volumes. No pneumothorax, pleural effusion or consolidation. Bilateral pulmonary vascular congestion, although mildly regressed compared to 04/12/2020. Visualized tracheal air column is within normal limits. No acute osseous abnormality identified. IMPRESSION: Cardiomegaly with decreased but not resolved pulmonary interstitial edema since 04/12/2020. No new cardiopulmonary abnormality. Electronically Signed   By: Genevie Ann M.D.   On: 04/15/2020 11:38   DG Chest Port 1 View  Result Date: 04/13/2020 CLINICAL DATA:  Acute on chronic CHF, thyroid cancer EXAM: PORTABLE CHEST 1 VIEW COMPARISON:  Chest radiograph from one day prior. FINDINGS: Stable cardiomediastinal  silhouette with mild to moderate cardiomegaly. No pneumothorax. No pleural effusion. Mild pulmonary edema, similar. IMPRESSION: Mild congestive heart failure, similar. Electronically Signed   By: Ilona Sorrel M.D.   On: 04/13/2020 09:29   DG Chest Port 1 View  Result Date: 04/12/2020 CLINICAL DATA:  Short of breath.  Cough EXAM: PORTABLE CHEST 1 VIEW COMPARISON:  04/01/2020 FINDINGS: Enlarged cardiac silhouette with ectatic aorta. There is patchy bilateral airspace disease. No focal consolidation. No pneumothorax. Mild improvement in airspace opacities. IMPRESSION: Patchy bilateral airspace disease suggests pulmonary edema. Mild improvement from comparison exam. Electronically Signed   By: Suzy Bouchard M.D.   On: 04/12/2020 10:40   DG Chest Portable 1 View  Result Date: 04/09/2020 CLINICAL DATA:  Shortness of breath EXAM: PORTABLE CHEST 1 VIEW COMPARISON:  February 21, 2020 FINDINGS: There is cardiomegaly with pulmonary venous hypertension. There is mild interstitial edema. No consolidation. No adenopathy. No bone lesions. IMPRESSION: Cardiomegaly with pulmonary vascular congestion. Mild interstitial edema. Overall appearance is felt to be indicative of congestive heart failure. No consolidation. Electronically Signed   By: Lowella Grip III M.D.   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 -Likely due to thoracic restriction with obesity hypoventilation syndrome and obstructive sleep apnea overlap -spirometry with graph to evaluate residual lung function and stratification of ventilatory defect -reviewed Arterial blood gas to allow patient for BiPAP qualification for home use -Consultation with [facility] for medical equipment and supplies-BIPAP is being utilized   -Patient would benefit from bariatric evaluation -Complicated by fluid shifts secondary to heart failure and renal impairment -Would benefit from occupational  and physical therapy -D-dimer to rule out pulmonary venous thromboembolism-VQ negative -RD nutritional evaluation for appropriate dietary changes with morbid obesity , hypothyroid and DM2, as well as bariatric evaluation.     Obstructive sleep apnea  - patient uses CPAP at home at 19cm H2O  - BIPAP while in house QHS   Thank you for allowing me to participate in the care of this patient.   Patient/Family are satisfied with care plan and all questions have been answered.   This document was prepared using Dragon voice recognition software and may include unintentional dictation errors.     Ottie Glazier, M.D.  Division of [place]

## 2020-04-17 NOTE — TOC Progression Note (Signed)
Transition of Care [facility]) - Progression Note    Patient Details  Name: Madison Mcbride MRN: [id number] Date of Birth: Aug 21, 1955  Transition of Care [facility]) CM/SW [place], RN Phone Number: 04/17/2020, 2:15 PM  Clinical Narrative:     Spoke with husband and wife, they are in agreement to accept bed offer at [facility].  Spoke with admissions coordinator, she is starting insurance auth today.   Expected Discharge Plan: [place] Barriers to Discharge: Continued Medical Work up  Expected Discharge Plan and Services Expected Discharge Plan: [place] Choice: [place] arrangements for the past 2 months: Single Family Home                    Social Determinants of Health (SDOH) Interventions    Readmission Risk Interventions No flowsheet data found.

## 2020-04-17 NOTE — Progress Notes (Signed)
PROGRESS NOTE    Madison Mcbride  ZDG:[id number] DOB: [birth year] DOA: 04/09/2020 PCP: Rusty Aus, MD   Brief Narrative:  Madison Mcbride is a 64 y.o. female with medical history significant ofdCHF,hyperlipidemia, diabetes mellitus, hypothyroidism, depression, thyroid cancer, morbid obesity, chronic back pain, OSA on CPAP, CKD-IIIa.  She presented to the hospital because of progressively worsening shortness of breath. Her oxygen saturation was low around 65% on room air.  She was admitted to the hospital for acute exacerbation of chronic diastolic CHF and acute hypoxemic respiratory failure.  Initially, she was treated with BiPAP and was subsequently transitioned to oxygen via nasal cannula.  ABG shows chronic hypercapnia and patient likely has obesity hypoventilation syndrome in addition to her obstructive sleep apnea.  She was treated with IV Lasix but she developed acute kidney injury so IV Lasix was held.  She was also started on midodrine for low blood pressure.  Nephrologist was consulted to assist with management of AKI with underlying CKD stage IIIa.  Cardiologist was also consulted to assist with management of CHF.  She was evaluated by the pulmonologist and patient will be a candidate for home BiPAP/trilogy machine for management of chronic hypoxemic and hypercapnic respiratory failure and OSA.  She developed acute change in mental status, lethargy and confusion).  This occurred few hours after she received oxycodone for pain.  She also had abnormal urinalysis and urine culture showed E. coli.  It was not clear whether encephalopathy was from oxycodone or from UTI.  She was treated with empiric IV Rocephin.  Subjective: Patient was feeling better when seen today.  No new complaint.  She was able to tolerate BiPAP little better overnight.  Assessment & Plan:   Principal Problem:   Acute on chronic diastolic CHF (congestive heart failure) (HCC) Active Problems:    Hypothyroidism   Acute respiratory failure with hypoxia and hypercapnia (HCC)   Acute renal failure superimposed on stage 3a chronic kidney disease (HCC)   Diabetes mellitus without complication (HCC)   HLD (hyperlipidemia)   Depression   Elevated troponin   OSA (obstructive sleep apnea)  Acute hypoxic and hypercapnic respiratory failure.  Initially requiring BiPAP, not transition to [place], saturating well on 4L, decreased to 2 with no significant change in saturation.  Concern of obesity hypoventilation syndrome along with sleep apnea.  D-dimer was checked  which was elevated at 5333. VQ scan was without any perfusion defect.  Repeat chest x-ray with some persistent pulmonary edema although improved. -Continue BiPAP at nighttime. -Pulmonary is following-appreciate their recommendations. -They are recommending BiPAP at night instead of CPAP at home. -Convert IV Lasix to p.o.  UTI.  Completed the course of antibiotics.  Urine culture with E. coli, good sensitivity.  He was given ceftriaxone initially which was transitioned to Keflex to complete a 5-day course.  HFpEF.  There was some concern of CHF exacerbation and she was started on IV Lasix, resulted in AKI and diuretics were held for couple of days and then restarted on lower dose.  Creatinine at 1.06 today -Change IV Lasix to p.o. -Daily BMPs.  Hypothyroidism. -Continue home dose of Synthroid.  Chronic pain syndrome.  -Continue home dose of oxycodone, ropinirole and gabapentin.  AKI.  Resolved.  Most likely secondary to diuresis. Renal ultrasound with cortical thinning, no hydronephrosis and a suspected left nephrolithiasis. -Continue to monitor  Hypotension.  Blood pressure within goal with midodrine. -Continue midodrine and monitor.  Morbid obesity. Body mass index is 53.43 kg/m.  -This  will complicate overall management and prognosis.  Objective: Vitals:   04/16/20 2152 04/17/20 0500 04/17/20 0746 04/17/20 1228  BP:  129/71  137/69 (!) 123/93  Pulse: 67  78 86  Resp: 17  17 17   Temp: 98.6 F (37 C)  98.6 F (37 C) 98.9 F (37.2 C)  TempSrc: Oral  Oral Oral  SpO2: 90%  98% 94%  Weight:  (!) 145.7 kg    Height:        Intake/Output Summary (Last 24 hours) at 04/17/2020 1425 Last data filed at 04/17/2020 1015 Gross per 24 hour  Intake 720 ml  Output 950 ml  Net -230 ml   Filed Weights   04/15/20 0400 04/16/20 0551 04/17/20 0500  Weight: (!) 150.9 kg (!) 148.3 kg (!) 145.7 kg    Examination:  General.  Well-developed, obese lady, in no acute distress. Pulmonary.  Lungs clear bilaterally, normal respiratory effort. CV.  Regular rate and rhythm, no JVD, rub or murmur. Abdomen.  Soft, nontender, nondistended, BS positive. CNS.  Alert and oriented x3.  No focal neurologic deficit. Extremities. 1+ LE edema with signs of venous stasis dermatitis, pulses intact and symmetrical. Psychiatry.  Judgment and insight appears normal.  DVT prophylaxis: Lovenox Code Status: Full Family Communication: Discussed with patient. Disposition Plan:  Status is: Inpatient  Remains inpatient appropriate because:Inpatient level of care appropriate due to severity of illness   Dispo: The patient is from: Home              Anticipated d/c is to: SNF              Anticipated d/c date is: 1 day              Patient currently is medically stable for discharge.  Patient had a bed offer today, waiting for insurance authorization.  Covid test ordered.  Consultants:   Cardiology  Pulmonology  Nephrology  Procedures:  Antimicrobials:   Data Reviewed: I have personally reviewed following labs and imaging studies  CBC: Recent Labs  Lab 04/11/20 0359 04/12/20 0501 04/13/20 0738 04/15/20 0523  WBC 12.8* 11.1* 10.5 10.0  NEUTROABS 9.8* 8.3* 7.8*  --   HGB 10.8* 10.8* 10.9* 11.1*  HCT 34.4* 35.7* 37.6 37.4  MCV 93.0 95.7 98.2 94.7  PLT 343 341 354 160   Basic Metabolic Panel: Recent Labs  Lab  04/12/20 0501 04/13/20 0738 04/14/20 0314 04/15/20 0523 04/17/20 0554  NA 140 139 142 145 141  K 4.6 4.6 4.7 4.4 4.1  CL 100 99 101 100 96*  CO2 32 32 33* 36* 34*  GLUCOSE 94 92 119* 80 86  BUN 68* 55* 42* 28* 22  CREATININE 1.98* 1.45* 1.14* 0.93 1.06*  CALCIUM 8.0* 9.0 8.9 9.3 9.1   GFR: Estimated Creatinine Clearance: 76.2 mL/min (A) (by C-G formula based on SCr of 1.06 mg/dL (H)). Liver Function Tests: No results for input(s): AST, ALT, ALKPHOS, BILITOT, PROT, ALBUMIN in the last 168 hours. No results for input(s): LIPASE, AMYLASE in the last 168 hours. No results for input(s): AMMONIA in the last 168 hours. Coagulation Profile: No results for input(s): INR, PROTIME in the last 168 hours. Cardiac Enzymes: No results for input(s): CKTOTAL, CKMB, CKMBINDEX, TROPONINI in the last 168 hours. BNP (last 3 results) No results for input(s): PROBNP in the last 8760 hours. HbA1C: No results for input(s): HGBA1C in the last 72 hours. CBG: Recent Labs  Lab 04/16/20 1137 04/16/20 1554 04/16/20 2152 04/17/20 0746 04/17/20 1235  GLUCAP 108* 110* 93 83 113*   Lipid Profile: No results for input(s): CHOL, HDL, LDLCALC, TRIG, CHOLHDL, LDLDIRECT in the last 72 hours. Thyroid Function Tests: No results for input(s): TSH, T4TOTAL, FREET4, T3FREE, THYROIDAB in the last 72 hours. Anemia Panel: No results for input(s): VITAMINB12, FOLATE, FERRITIN, TIBC, IRON, RETICCTPCT in the last 72 hours. Sepsis Labs: No results for input(s): PROCALCITON, LATICACIDVEN in the last 168 hours.  Recent Results (from the past 240 hour(s))  Respiratory Panel by RT PCR (Flu A&B, Covid) - Nasopharyngeal Swab     Status: None   Collection Time: 04/09/20  1:17 PM   Specimen: Nasopharyngeal Swab  Result Value Ref Range Status   SARS Coronavirus 2 by RT PCR NEGATIVE NEGATIVE Final    Comment: (NOTE) SARS-CoV-2 target nucleic acids are NOT DETECTED.  The SARS-CoV-2 RNA is generally detectable in upper  respiratoy specimens during the acute phase of infection. The lowest concentration of SARS-CoV-2 viral copies this assay can detect is 131 copies/mL. A negative result does not preclude SARS-Cov-2 infection and should not be used as the sole basis for treatment or other patient management decisions. A negative result may occur with  improper specimen collection/handling, submission of specimen other than nasopharyngeal swab, presence of viral mutation(s) within the areas targeted by this assay, and inadequate number of viral copies (<131 copies/mL). A negative result must be combined with clinical observations, patient history, and epidemiological information. The expected result is Negative.  Fact Sheet for Patients:  PinkCheek.be  Fact Sheet for Healthcare Providers:  GravelBags.it  This test is no t yet approved or cleared by the [place] FDA and  has been authorized for detection and/or diagnosis of SARS-CoV-2 by FDA under an Emergency Use Authorization (EUA). This EUA will remain  in effect (meaning this test can be used) for the duration of the COVID-19 declaration under Section 564(b)(1) of the Act, 21 U.S.C. section 360bbb-3(b)(1), unless the authorization is terminated or revoked sooner.     Influenza A by PCR NEGATIVE NEGATIVE Final   Influenza B by PCR NEGATIVE NEGATIVE Final    Comment: (NOTE) The Xpert Xpress SARS-CoV-2/FLU/RSV assay is intended as an aid in  the diagnosis of influenza from Nasopharyngeal swab specimens and  should not be used as a sole basis for treatment. Nasal washings and  aspirates are unacceptable for Xpert Xpress SARS-CoV-2/FLU/RSV  testing.  Fact Sheet for Patients: PinkCheek.be  Fact Sheet for Healthcare Providers: GravelBags.it  This test is not yet approved or cleared by the [place] FDA and  has been  authorized for detection and/or diagnosis of SARS-CoV-2 by  FDA under an Emergency Use Authorization (EUA). This EUA will remain  in effect (meaning this test can be used) for the duration of the  Covid-19 declaration under Section 564(b)(1) of the Act, 21  U.S.C. section 360bbb-3(b)(1), unless the authorization is  terminated or revoked. Performed at [facility], [address] [place], [place] [address]   Urine Culture     Status: Abnormal   Collection Time: 04/11/20  4:50 PM   Specimen: Urine, Random  Result Value Ref Range Status   Specimen Description   Final    URINE, RANDOM Performed at [facility], [address], [place], [place] [address]    Special Requests   Final    NONE Performed at [facility], [address] [place], [place] [address]    Culture >=100,000 COLONIES/mL ESCHERICHIA COLI (A)  Final   Report Status  04/14/2020 FINAL  Final   Organism ID, Bacteria ESCHERICHIA COLI (A)  Final      Susceptibility   Escherichia coli - MIC*    AMPICILLIN 4 SENSITIVE Sensitive     CEFAZOLIN <=4 SENSITIVE Sensitive     CEFTRIAXONE <=0.25 SENSITIVE Sensitive     CIPROFLOXACIN >=4 RESISTANT Resistant     GENTAMICIN <=1 SENSITIVE Sensitive     IMIPENEM <=0.25 SENSITIVE Sensitive     NITROFURANTOIN <=16 SENSITIVE Sensitive     TRIMETH/SULFA <=20 SENSITIVE Sensitive     AMPICILLIN/SULBACTAM <=2 SENSITIVE Sensitive     PIP/TAZO <=4 SENSITIVE Sensitive     * >=100,000 COLONIES/mL ESCHERICHIA COLI     Radiology Studies: DG Chest Port 1 View  Result Date: 04/17/2020 CLINICAL DATA:  Congestive heart failure EXAM: PORTABLE CHEST 1 VIEW COMPARISON:  04/15/2020 FINDINGS: Stable cardiomegaly. Pulmonary vascular congestion with increased interstitial opacities bilaterally. No large pleural fluid collection. No pneumothorax. IMPRESSION: Findings suggestive of CHF with pulmonary edema. No appreciable interval change from prior. Electronically Signed    By: Davina Poke D.O.   On: 04/17/2020 08:17    Scheduled Meds: . aspirin EC  81 mg Oral Daily  . atorvastatin  20 mg Oral Q2200  . buPROPion  150 mg Oral Daily  . chlorpheniramine-HYDROcodone  5 mL Oral Q8H  . cholecalciferol  1,000 Units Oral Daily  . enoxaparin (LOVENOX) injection  40 mg Subcutaneous Q12H  . furosemide  40 mg Oral Daily  . gabapentin  300 mg Oral BID  . insulin aspart  0-5 Units Subcutaneous QHS  . insulin aspart  0-9 Units Subcutaneous TID WC  . levothyroxine  250 mcg Oral Q0600  . multivitamin with minerals  1 tablet Oral Daily  . rOPINIRole  2 mg Oral TID  . sodium chloride flush  3 mL Intravenous Q12H  . zinc sulfate  220 mg Oral Daily   Continuous Infusions: . sodium chloride       LOS: 8 days   Time spent: 20 minutes  Lorella Nimrod, MD [facility]  If 7PM-7AM, please contact night-coverage [email or web address]  04/17/2020, 2:25 PM   This record has been created using Systems analyst. Errors have been sought and corrected,but may not always be located. Such creation errors do not reflect on the standard of care.

## 2020-04-18 LAB — BASIC METABOLIC PANEL
Anion gap: 10 (ref 5–15)
BUN: 21 mg/dL (ref 8–23)
CO2: 37 mmol/L — ABNORMAL HIGH (ref 22–32)
Calcium: 8.7 mg/dL — ABNORMAL LOW (ref 8.9–10.3)
Chloride: 96 mmol/L — ABNORMAL LOW (ref 98–111)
Creatinine, Ser: 1 mg/dL (ref 0.44–1.00)
GFR calc non Af Amer: 59 mL/min — ABNORMAL LOW (ref >60)
Glucose, Bld: 97 mg/dL (ref 70–99)
Potassium: 4.9 mmol/L (ref 3.5–5.1)
Sodium: 143 mmol/L (ref 135–145)

## 2020-04-18 LAB — GLUCOSE, CAPILLARY
Glucose-Capillary: 96 mg/dL (ref 70–99)
Glucose-Capillary: 127 mg/dL — ABNORMAL HIGH (ref 70–99)
Glucose-Capillary: 103 mg/dL — ABNORMAL HIGH (ref 70–99)

## 2020-04-18 LAB — CBC
HCT: 36.3 % (ref 36.0–46.0)
Hemoglobin: 10.5 g/dL — ABNORMAL LOW (ref 12.0–15.0)
MCH: 28.2 pg (ref 26.0–34.0)
MCHC: 28.9 g/dL — ABNORMAL LOW (ref 30.0–36.0)
MCV: 97.3 fL (ref 80.0–100.0)
Platelets: 448 10*3/uL — ABNORMAL HIGH (ref 150–400)
RBC: 3.73 MIL/uL — ABNORMAL LOW (ref 3.87–5.11)
RDW: 20.3 % — ABNORMAL HIGH (ref 11.5–15.5)
WBC: 10.3 10*3/uL (ref 4.0–10.5)
nRBC: 0 % (ref 0.0–0.2)

## 2020-04-18 MED ORDER — ASPIRIN 81 MG PO TBEC
81.0000 mg | DELAYED_RELEASE_TABLET | Freq: Every day | ORAL | 11 refills | Status: DC
Start: 2020-04-18 — End: 2021-03-24

## 2020-04-18 MED ORDER — MIDODRINE HCL 10 MG PO TABS: 10.0000 mg | ORAL_TABLET | Freq: Three times a day (TID) | ORAL | Status: DC | PRN

## 2020-04-18 MED ORDER — DM-GUAIFENESIN ER 30-600 MG PO TB12: 1.0000 | ORAL_TABLET | Freq: Two times a day (BID) | ORAL | Status: DC | PRN

## 2020-04-18 NOTE — Plan of Care (Signed)

## 2020-04-18 NOTE — TOC Transition Note (Signed)
Transition of Care [facility]) - CM/SW Discharge Note   Patient Details  Name: Madison Mcbride MRN: [id number] Date of Birth: 1956-04-08  Transition of Care [facility]) CM/SW Contact:  Victorino Dike, RN Phone Number: 04/18/2020, 4:09 PM   Clinical Narrative:     Patient to discharge to [facility] today, Transport through [place] [facility].  [facility] working on [employer] for home after discharge from hospital.   Husband notified.  Final next level of care: Skilled Nursing Facility Barriers to Discharge: Barriers Resolved   Patient Goals and CMS Choice   CMS Medicare.gov Compare Post Acute Care list provided to:: Patient Represenative (must comment) (Spouse [name]) Choice offered to / list presented to : Spouse  Discharge Placement              Patient chooses bed at: [facility]     Post Acute Care Choice: [place]                               Social Determinants of Health (SDOH) Interventions     Readmission Risk Interventions No flowsheet data found.

## 2020-04-18 NOTE — NC FL2 (Signed)
[place] LEVEL OF CARE SCREENING TOOL     IDENTIFICATION  Patient Name: Madison Mcbride Birthdate: 09/11/1955 Sex: female Admission Date (Current Location): 04/09/2020  [place] and [place] Number:  Engineering geologist and Address:  [facility], [address] [place], [place] [address]      Provider Number: [id number]  Attending Physician Name and Address:  Lorella Nimrod, MD  Relative Name and Phone Number:  [name] (spouse)    Current Level of Care: Hospital Recommended Level of Care: [place] Prior Approval Number:    Date Approved/Denied:   PASRR Number: [id number]  Discharge Plan: SNF    Current Diagnoses: Patient Active Problem List   Diagnosis Date Noted  . Acute on chronic diastolic CHF (congestive heart failure) ([place]) 04/09/2020  . Diabetes mellitus without complication ([place]) 51/88/4166  . HLD (hyperlipidemia) 04/09/2020  . Depression 04/09/2020  . Elevated troponin 04/09/2020  . OSA (obstructive sleep apnea) 04/09/2020  . Lumbar radiculopathy, acute   . Acute midline low back pain without sciatica   . Type 2 diabetes mellitus with diabetic neuropathy, without long-term current use of insulin ([place]) 02/21/2020  . Hypothyroidism 02/21/2020  . Morbid obesity with BMI of 50.0-59.9, adult ([place]) 02/21/2020  . Severe sepsis ([place]) 02/21/2020  . CAP (community acquired pneumonia) 02/21/2020  . Bilateral cellulitis of lower leg 02/21/2020  . Acute respiratory failure with hypoxia and hypercapnia ([place]) 02/21/2020  . Acute renal failure superimposed on stage 3a chronic kidney disease ([place]) 02/21/2020  . Acute metabolic encephalopathy 01/10/1600  . Septic shock ([place]) 02/21/2020    Orientation RESPIRATION BLADDER Height & Weight     Self, Situation, Place, Time  Other (Comment) (BiPap: 16/8 at 32%) Incontinent Weight: (!) 145.1 kg Height:  5\' 5"  (165.1 cm)  BEHAVIORAL SYMPTOMS/MOOD NEUROLOGICAL BOWEL  NUTRITION STATUS      Continent    AMBULATORY STATUS COMMUNICATION OF NEEDS Skin   Limited Assist Verbally Normal                       Personal Care Assistance Level of Assistance  Bathing, Dressing, Feeding Bathing Assistance: Limited assistance Feeding assistance: Limited assistance Dressing Assistance: Limited assistance     Functional Limitations Info             SPECIAL CARE FACTORS FREQUENCY                       Contractures      Additional Factors Info                  Current Medications (04/18/2020):  This is the current hospital active medication list Current Facility-Administered Medications  Medication Dose Route Frequency Provider Last Rate Last Admin  . 0.9 %  sodium chloride infusion  250 mL Intravenous PRN Ivor Costa, MD      . acetaminophen (TYLENOL) tablet 650 mg  650 mg Oral Q6H PRN Jennye Boroughs, MD   650 mg at 04/14/20 0245  . albuterol (VENTOLIN HFA) 108 (90 Base) MCG/ACT inhaler 2 puff  2 puff Inhalation Q4H PRN Ivor Costa, MD      . aspirin EC tablet 81 mg  81 mg Oral Daily Ivor Costa, MD   81 mg at 04/18/20 0928  . atorvastatin (LIPITOR) tablet 20 mg  20 mg Oral Q2200 Ivor Costa, MD   20 mg at 04/17/20 2329  . buPROPion (WELLBUTRIN SR) 12 hr tablet 150 mg  150 mg Oral Daily Ivor Costa, MD   150 mg at 04/18/20 9509  . chlorpheniramine-HYDROcodone (TUSSIONEX) 10-8 MG/5ML suspension 5 mL  5 mL Oral Q8H Ottie Glazier, MD   5 mL at 04/18/20 0928  . cholecalciferol (VITAMIN D) tablet 1,000 Units  1,000 Units Oral Daily Ivor Costa, MD   1,000 Units at 04/18/20 [phone]  . dextromethorphan-guaiFENesin (MUCINEX DM) 30-600 MG per 12 hr tablet 1 tablet  1 tablet Oral BID PRN Ivor Costa, MD   1 tablet at 04/16/20 1728  . enoxaparin (LOVENOX) injection 40 mg  40 mg Subcutaneous Q12H Ivor Costa, MD   40 mg at 04/18/20 0933  . furosemide (LASIX) tablet 40 mg  40 mg Oral Daily Lorella Nimrod, MD   40 mg at 04/18/20 0928  . gabapentin (NEURONTIN)  capsule 300 mg  300 mg Oral BID Lorella Nimrod, MD   300 mg at 04/18/20 1245  . hydrALAZINE (APRESOLINE) injection 5 mg  5 mg Intravenous Q2H PRN Ivor Costa, MD      . hydrOXYzine (ATARAX/VISTARIL) tablet 10 mg  10 mg Oral TID PRN Ivor Costa, MD      . insulin aspart (novoLOG) injection 0-5 Units  0-5 Units Subcutaneous QHS Ivor Costa, MD      . insulin aspart (novoLOG) injection 0-9 Units  0-9 Units Subcutaneous TID WC Ivor Costa, MD   1 Units at 04/12/20 1756  . levothyroxine (SYNTHROID) tablet 250 mcg  250 mcg Oral Q0600 Ivor Costa, MD   250 mcg at 04/18/20 0537  . midodrine (PROAMATINE) tablet 10 mg  10 mg Oral TID WC PRN Lorella Nimrod, MD      . multivitamin with minerals tablet 1 tablet  1 tablet Oral Daily Ivor Costa, MD   1 tablet at 04/18/20 [phone]  . oxyCODONE (Oxy IR/ROXICODONE) immediate release tablet 5 mg  5 mg Oral Q8H PRN Jennye Boroughs, MD   5 mg at 04/12/20 1302  . rOPINIRole (REQUIP) tablet 2 mg  2 mg Oral TID Jennye Boroughs, MD   2 mg at 04/18/20 0927  . sodium chloride flush (NS) 0.9 % injection 3 mL  3 mL Intravenous Q12H Ivor Costa, MD   3 mL at 04/18/20 0935  . sodium chloride flush (NS) 0.9 % injection 3 mL  3 mL Intravenous PRN Ivor Costa, MD   3 mL at 04/09/20 2136  . zinc sulfate capsule 220 mg  220 mg Oral Daily Ivor Costa, MD   220 mg at 04/18/20 8338     Discharge Medications: Please see discharge summary for a list of discharge medications.  Relevant Imaging Results:  Relevant Lab Results:   Additional Information SS# [id number]  Victorino Dike, RN

## 2020-04-18 NOTE — Progress Notes (Signed)
Patient discharged to facility per orders. PIVs and tele removed from patient. AVS and discharged packet given to [place].

## 2020-04-18 NOTE — Discharge Summary (Signed)
Physician Discharge Summary  Genola Yuille Delrossi AJO:[id number] DOB: 05-11-1956 DOA: 04/09/2020  PCP: Rusty Aus, MD  Admit date: 04/09/2020 Discharge date: 04/18/2020  Admitted From: Home Disposition: SNF   Recommendations for Outpatient Follow-up:  1. Follow up with PCP in 1-2 weeks 2. Follow-up with pulmonology 3. Please obtain BMP/CBC in one week 4. Please follow up on the following pending results:None  Home Health: No  equipment/Devices: Rolling walker Discharge Condition: Stable CODE STATUS: Full Diet recommendation: Heart Healthy / Carb Modified   Brief/Interim Summary: Hydia Copelin Hesteris a 64 y.o.femalewith medical history significant ofdCHF,hyperlipidemia, diabetes mellitus, hypothyroidism, depression, thyroid cancer, morbid obesity, chronic back pain, OSA on CPAP, CKD-IIIa.She presented to the hospital because of progressively worsening shortness of breath. Her oxygen saturation was low around 65% on room air.  She was admitted to the hospital for acute exacerbation of chronic diastolic CHF and acute hypoxemic respiratory failure. Initially, she was treated with BiPAP and was subsequently transitioned to oxygen via nasal cannula. ABG shows chronic hypercapnia and patient likely has obesity hypoventilation syndrome in addition to her obstructive sleep apnea.  She was treated with IV Lasix but she developed acute kidney injury so IV Lasix was held, and then later transitioned to p.o. Lasix. She was also started on midodrine for low blood pressure.  Nephrology was consulted to assist with management of AKI with underlying CKD stage IIIa.  Cardiologist was also consulted to assist with management of CHF. She was evaluated by the pulmonologist and patient will be a candidate for home BiPAP/trilogy machine for management of chronic hypoxemic and hypercapnic respiratory failure and OSA.  Patient also found to have UTI, urine culture grew E. coli with good  sensitivity and she completed the course of antibiotics while in the hospital.  Patient is morbidly obese and will get benefit from bariatric procedure down the road.  Patient will continue her home meds and follow-up with her primary care providers  Discharge Diagnoses:  Principal Problem:   Acute on chronic diastolic CHF (congestive heart failure) ([place]) Active Problems:   Hypothyroidism   Acute respiratory failure with hypoxia and hypercapnia (HCC)   Acute renal failure superimposed on stage 3a chronic kidney disease (HCC)   Diabetes mellitus without complication (HCC)   HLD (hyperlipidemia)   Depression   Elevated troponin   OSA (obstructive sleep apnea)  Discharge Instructions  Discharge Instructions    Bipap   Complete by: As directed    Bipap daily at bed time.   Diet - low sodium heart healthy   Complete by: As directed    Increase activity slowly   Complete by: As directed      Allergies as of 04/18/2020   No Known Allergies     Medication List    STOP taking these medications   metolazone 5 MG tablet Commonly known as: ZAROXOLYN   predniSONE 10 MG tablet Commonly known as: DELTASONE   propranolol 20 MG tablet Commonly known as: INDERAL   valsartan 160 MG tablet Commonly known as: DIOVAN     TAKE these medications   acetaminophen 500 MG tablet Commonly known as: TYLENOL Take 1,000 mg by mouth every 8 (eight) hours.   aspirin 81 MG EC tablet Take 1 tablet (81 mg total) by mouth daily. Swallow whole.   atorvastatin 20 MG tablet Commonly known as: LIPITOR Take 20 mg by mouth daily at 10 pm.   Biotin 2.5 MG Caps Take 2.5 mg by mouth daily.   buPROPion 150 MG 12  hr tablet Commonly known as: WELLBUTRIN SR Take 150 mg by mouth daily.   D3-1000 25 MCG (1000 UT) tablet Generic drug: Cholecalciferol Take 1,000 Units by mouth daily.   dextromethorphan-guaiFENesin 30-600 MG 12hr tablet Commonly known as: MUCINEX DM Take 1 tablet by mouth 2 (two)  times daily as needed for cough.   furosemide 40 MG tablet Commonly known as: LASIX Take 40 mg by mouth daily.   gabapentin 100 MG capsule Commonly known as: NEURONTIN Take 2 capsules (200 mg total) by mouth 3 (three) times daily. What changed: how much to take   methocarbamol 500 MG tablet Commonly known as: ROBAXIN Take 1 tablet (500 mg total) by mouth every 8 (eight) hours as needed for muscle spasms.   midodrine 10 MG tablet Commonly known as: PROAMATINE Take 1 tablet (10 mg total) by mouth 3 (three) times daily with meals as needed (hypotension SBP <100).   multivitamin tablet Take 1 tablet by mouth daily.   nystatin powder Commonly known as: MYCOSTATIN/NYSTOP Apply topically 3 (three) times daily.   Ozempic (0.25 or 0.5 MG/DOSE) 2 MG/1.5ML Sopn Generic drug: Semaglutide(0.25 or 0.5MG /DOS) Inject 0.375 mLs into the skin every Saturday.   potassium chloride SA 20 MEQ tablet Commonly known as: KLOR-CON Take 1 tablet (20 mEq total) by mouth daily.   rOPINIRole 3 MG tablet Commonly known as: REQUIP Take 3 mg by mouth 3 (three) times daily.   Synthroid 125 MCG tablet Generic drug: levothyroxine Take 250 mcg by mouth every morning.   zinc gluconate 50 MG tablet Take 50 mg by mouth daily.       Contact information for follow-up providers    [facility] up on 04/30/2020.   Specialty: Cardiology Why: at 9:00am.  Enter through the [facility] entrance Contact information: [place] [phone]       Corey Skains, MD Follow up in 1 week(s).   Specialty: Cardiology Contact information: [facility] [place] [address] [phone]        Rusty Aus, MD. Schedule an appointment as soon as possible for a visit.   Specialty: Internal Medicine Contact information: [place] [address] [phone]            Contact information for after-discharge care    Destination    [facility] .   Service: Skilled Nursing Contact information: [address] [place] [phone]                 No Known Allergies  Consultations:  Cardiology  Nephrology  Pulmonology  Procedures/Studies: NM Pulmonary Perfusion  Result Date: 04/15/2020 CLINICAL DATA:  Respiratory failure/shortness of breath. Elevated D-dimer EXAM: NUCLEAR MEDICINE PERFUSION LUNG SCAN TECHNIQUE: Perfusion images were obtained in multiple projections after intravenous injection of radiopharmaceutical. Views: Anterior, posterior, left lateral, right lateral, RPO, LPO, RAO, LAO. RADIOPHARMACEUTICALS:  4.063 mCi Tc-100m MAA IV COMPARISON:  Chest radiograph April 15, 2020 FINDINGS: The distribution of radiotracer uptake bilaterally is homogeneous and symmetric without focal perfusion defect. There is cardiomegaly. IMPRESSION: No appreciable perfusion defects. No findings indicative of pulmonary embolus. Cardiomegaly noted. Electronically Signed   By: Lowella Grip III M.D.   On: 04/15/2020 13:03   US RENAL  Result Date: 04/11/2020 CLINICAL DATA:  Acute renal failure EXAM: RENAL / URINARY TRACT ULTRASOUND COMPLETE COMPARISON:  Renal ultrasound 10/22/2019 FINDINGS: Right Kidney: Renal measurements: 10.8 x 6.6 x 6.2 cm = volume: 233 mL. Echogenicity is within normal limits. Mild cortical thinning and renal sinus lipomatosis. Simple appearing 1.4 x 1.3 x 1.3 cm cyst arising from the upper pole right kidney. No concerning renal mass, shadowing calculus or hydronephrosis. Left Kidney: Renal measurements: 13.5 x 7.2 x 6.4 cm = volume: 323.4 mL. Echogenicity is within normal limits. Mild cortical thinning and renal sinus lipomatosis. No concerning renal mass, shadowing calculus or hydronephrosis. Bladder: Appears  normal for degree of bladder distention. Other: None. IMPRESSION: Kidneys appear slightly enlarged bilaterally (upper limits normal for females 150 mL). With some chronic renal cortical thinning and renal sinus lipomatosis, can reflect a degree of chronic renal failure or diabetic nephropathy but with preservation of the parenchymal echogenicity arguing against a more acute medical renal disease. Simple 1.4 cm cyst in the upper pole right kidney. Electronically Signed   By: Lovena Le M.D.   On: 04/11/2020 19:19   DG Chest Port 1 View  Result Date: 04/17/2020 CLINICAL DATA:  Congestive heart failure EXAM: PORTABLE CHEST 1 VIEW COMPARISON:  04/15/2020 FINDINGS: Stable cardiomegaly. Pulmonary vascular congestion with increased interstitial opacities bilaterally. No large pleural fluid collection. No pneumothorax. IMPRESSION: Findings suggestive of CHF with pulmonary edema. No appreciable interval change from prior. Electronically Signed   By: Davina Poke D.O.   On: 04/17/2020 08:17   DG Chest Port 1 View  Result Date: 04/15/2020 CLINICAL DATA:  64 year old female undergoing V/Q scan for respiratory failure. EXAM: PORTABLE CHEST 1 VIEW COMPARISON:  Portable chest 04/13/2020 and earlier. FINDINGS: Portable AP semi upright view at 1108 hours. Stable cardiomegaly and mediastinal contours. Mildly improved lung volumes. No pneumothorax, pleural effusion or consolidation. Bilateral pulmonary vascular congestion, although mildly regressed compared to 04/12/2020. Visualized tracheal air column is within normal limits. No acute osseous abnormality identified. IMPRESSION: Cardiomegaly with decreased but not resolved pulmonary interstitial edema since 04/12/2020. No new cardiopulmonary abnormality. Electronically Signed   By: Genevie Ann M.D.   On: 04/15/2020 11:38   DG Chest Port 1 View  Result Date: 04/13/2020 CLINICAL DATA:  Acute on chronic CHF, thyroid cancer EXAM: PORTABLE CHEST 1 VIEW COMPARISON:  Chest  radiograph from one day prior. FINDINGS: Stable cardiomediastinal silhouette with mild to moderate cardiomegaly. No pneumothorax. No pleural effusion. Mild pulmonary edema, similar. IMPRESSION: Mild congestive heart failure, similar. Electronically Signed   By: Ilona Sorrel M.D.   On: 04/13/2020 09:29   DG Chest Port 1 View  Result Date: 04/12/2020 CLINICAL DATA:  Short of breath.  Cough EXAM: PORTABLE CHEST 1 VIEW COMPARISON:  04/01/2020 FINDINGS: Enlarged cardiac silhouette with ectatic aorta. There is patchy bilateral airspace disease. No focal consolidation. No pneumothorax. Mild improvement in airspace opacities. IMPRESSION: Patchy bilateral airspace disease suggests pulmonary edema. Mild improvement from comparison exam. Electronically Signed   By: Suzy Bouchard M.D.   On: 04/12/2020 10:40   DG Chest Portable 1 View  Result Date: 04/09/2020 CLINICAL DATA:  Shortness of breath EXAM: PORTABLE CHEST 1 VIEW COMPARISON:  February 21, 2020 FINDINGS: There is cardiomegaly with pulmonary venous hypertension. There is mild interstitial edema. No consolidation. No adenopathy. No bone lesions. IMPRESSION: Cardiomegaly with pulmonary vascular congestion. Mild interstitial edema. Overall appearance is felt to be indicative of congestive heart failure. No consolidation. Electronically Signed   By: Lowella Grip III M.D.   On: 04/09/2020 13:59    Subjective: Patient was feeling better when seen today. No new  complaints. Discussed about loosing weight and asking PCP for referral to bariatric services.  Discharge Exam: Vitals:   04/18/20 0442 04/18/20 0810  BP: (!) 107/51 (!) 113/52  Pulse: 71 70  Resp:  16  Temp: 97.9 F (36.6 C) 99.6 F (37.6 C)  SpO2: 100% 93%   Vitals:   04/17/20 1955 04/18/20 0326 04/18/20 0442 04/18/20 0810  BP: 103/68 (!) 95/51 (!) 107/51 (!) 113/52  Pulse: 78 70 71 70  Resp: 19 17  16   Temp: 98.4 F (36.9 C) 98.2 F (36.8 C) 97.9 F (36.6 C) 99.6 F (37.6 C)   TempSrc: Oral  Oral Oral  SpO2: 97% 96% 100% 93%  Weight:  (!) 145.1 kg    Height:        General: Pt is alert, awake, not in acute distress Cardiovascular: RRR, S1/S2 +, no rubs, no gallops Respiratory: CTA bilaterally, no wheezing, no rhonchi Abdominal: Soft, NT, ND, bowel sounds + Extremities: Trace LE edema, no cyanosis   The results of significant diagnostics from this hospitalization (including imaging, microbiology, ancillary and laboratory) are listed below for reference.    Microbiology: Recent Results (from the past 240 hour(s))  Respiratory Panel by RT PCR (Flu A&B, Covid) - Nasopharyngeal Swab     Status: None   Collection Time: 04/09/20  1:17 PM   Specimen: Nasopharyngeal Swab  Result Value Ref Range Status   SARS Coronavirus 2 by RT PCR NEGATIVE NEGATIVE Final    Comment: (NOTE) SARS-CoV-2 target nucleic acids are NOT DETECTED.  The SARS-CoV-2 RNA is generally detectable in upper respiratoy specimens during the acute phase of infection. The lowest concentration of SARS-CoV-2 viral copies this assay can detect is 131 copies/mL. A negative result does not preclude SARS-Cov-2 infection and should not be used as the sole basis for treatment or other patient management decisions. A negative result may occur with  improper specimen collection/handling, submission of specimen other than nasopharyngeal swab, presence of viral mutation(s) within the areas targeted by this assay, and inadequate number of viral copies (<131 copies/mL). A negative result must be combined with clinical observations, patient history, and epidemiological information. The expected result is Negative.  Fact Sheet for Patients:  PinkCheek.be  Fact Sheet for Healthcare Providers:  GravelBags.it  This test is no t yet approved or cleared by the [place] FDA and  has been authorized for detection and/or diagnosis of SARS-CoV-2  by FDA under an Emergency Use Authorization (EUA). This EUA will remain  in effect (meaning this test can be used) for the duration of the COVID-19 declaration under Section 564(b)(1) of the Act, 21 U.S.C. section 360bbb-3(b)(1), unless the authorization is terminated or revoked sooner.     Influenza A by PCR NEGATIVE NEGATIVE Final   Influenza B by PCR NEGATIVE NEGATIVE Final    Comment: (NOTE) The Xpert Xpress SARS-CoV-2/FLU/RSV assay is intended as an aid in  the diagnosis of influenza from Nasopharyngeal swab specimens and  should not be used as a sole basis for treatment. Nasal washings and  aspirates are unacceptable for Xpert Xpress SARS-CoV-2/FLU/RSV  testing.  Fact Sheet for Patients: PinkCheek.be  Fact Sheet for Healthcare Providers: GravelBags.it  This test is not yet approved or cleared by the [place] FDA and  has been authorized for detection and/or diagnosis of SARS-CoV-2 by  FDA under an Emergency Use Authorization (EUA). This EUA will remain  in effect (meaning this test can be used) for the duration of the  Covid-19 declaration under Section  564(b)(1) of the Act, 21  U.S.C. section 360bbb-3(b)(1), unless the authorization is  terminated or revoked. Performed at [facility], [address] [place], [place] [address]   Urine Culture     Status: Abnormal   Collection Time: 04/11/20  4:50 PM   Specimen: Urine, Random  Result Value Ref Range Status   Specimen Description   Final    URINE, RANDOM Performed at [facility], [address] [place], [place] [address]    Special Requests   Final    NONE Performed at [facility], [address] [place], [place] [address]    Culture >=100,000 COLONIES/mL ESCHERICHIA COLI (A)  Final   Report Status 04/14/2020 FINAL  Final   Organism ID, Bacteria ESCHERICHIA COLI (A)  Final      Susceptibility   Escherichia coli - MIC*     AMPICILLIN 4 SENSITIVE Sensitive     CEFAZOLIN <=4 SENSITIVE Sensitive     CEFTRIAXONE <=0.25 SENSITIVE Sensitive     CIPROFLOXACIN >=4 RESISTANT Resistant     GENTAMICIN <=1 SENSITIVE Sensitive     IMIPENEM <=0.25 SENSITIVE Sensitive     NITROFURANTOIN <=16 SENSITIVE Sensitive     TRIMETH/SULFA <=20 SENSITIVE Sensitive     AMPICILLIN/SULBACTAM <=2 SENSITIVE Sensitive     PIP/TAZO <=4 SENSITIVE Sensitive     * >=100,000 COLONIES/mL ESCHERICHIA COLI  SARS Coronavirus 2 by RT PCR (hospital order, performed in [place] hospital lab) Nasopharyngeal Nasopharyngeal Swab     Status: None   Collection Time: 04/17/20  2:17 PM   Specimen: Nasopharyngeal Swab  Result Value Ref Range Status   SARS Coronavirus 2 NEGATIVE NEGATIVE Final    Comment: (NOTE) SARS-CoV-2 target nucleic acids are NOT DETECTED.  The SARS-CoV-2 RNA is generally detectable in upper and lower respiratory specimens during the acute phase of infection. The lowest concentration of SARS-CoV-2 viral copies this assay can detect is 250 copies / mL. A negative result does not preclude SARS-CoV-2 infection and should not be used as the sole basis for treatment or other patient management decisions.  A negative result may occur with improper specimen collection / handling, submission of specimen other than nasopharyngeal swab, presence of viral mutation(s) within the areas targeted by this assay, and inadequate number of viral copies (<250 copies / mL). A negative result must be combined with clinical observations, patient history, and epidemiological information.  Fact Sheet for Patients:   StrictlyIdeas.no  Fact Sheet for Healthcare Providers: BankingDealers.co.za  This test is not yet approved or  cleared by the [place] FDA and has been authorized for detection and/or diagnosis of SARS-CoV-2 by FDA under an Emergency Use Authorization (EUA).  This EUA will  remain in effect (meaning this test can be used) for the duration of the COVID-19 declaration under Section 564(b)(1) of the Act, 21 U.S.C. section 360bbb-3(b)(1), unless the authorization is terminated or revoked sooner.  Performed at [facility], [address] [place],  [address]      Labs: BNP (last 3 results) Recent Labs    02/20/20 2128 04/09/20 1303  BNP 180.5* 998.3*   Basic Metabolic Panel: Recent Labs  Lab 04/13/20 0738 04/14/20 0314 04/15/20 0523 04/17/20 0554 04/18/20 0614  NA 139 142 145 141 143  K 4.6 4.7 4.4 4.1 4.9  CL 99 101 100 96* 96*  CO2 32 33* 36* 34* 37*  GLUCOSE 92 119* 80 86 97  BUN 55* 42* 28* 22 21  CREATININE 1.45* 1.14*  0.93 1.06* 1.00  CALCIUM 9.0 8.9 9.3 9.1 8.7*   Liver Function Tests: No results for input(s): AST, ALT, ALKPHOS, BILITOT, PROT, ALBUMIN in the last 168 hours. No results for input(s): LIPASE, AMYLASE in the last 168 hours. No results for input(s): AMMONIA in the last 168 hours. CBC: Recent Labs  Lab 04/12/20 0501 04/13/20 0738 04/15/20 0523 04/18/20 0614  WBC 11.1* 10.5 10.0 10.3  NEUTROABS 8.3* 7.8*  --   --   HGB 10.8* 10.9* 11.1* 10.5*  HCT 35.7* 37.6 37.4 36.3  MCV 95.7 98.2 94.7 97.3  PLT 341 354 392 448*   Cardiac Enzymes: No results for input(s): CKTOTAL, CKMB, CKMBINDEX, TROPONINI in the last 168 hours. BNP: Invalid input(s): POCBNP CBG: Recent Labs  Lab 04/17/20 0746 04/17/20 1235 04/17/20 1649 04/17/20 2101 04/18/20 0807  GLUCAP 83 113* 118* 102* 96   D-Dimer No results for input(s): DDIMER in the last 72 hours. Hgb A1c No results for input(s): HGBA1C in the last 72 hours. Lipid Profile No results for input(s): CHOL, HDL, LDLCALC, TRIG, CHOLHDL, LDLDIRECT in the last 72 hours. Thyroid function studies No results for input(s): TSH, T4TOTAL, T3FREE, THYROIDAB in the last 72 hours.  Invalid input(s): FREET3 Anemia work up No results for input(s): VITAMINB12, FOLATE,  FERRITIN, TIBC, IRON, RETICCTPCT in the last 72 hours. Urinalysis    Component Value Date/Time   COLORURINE YELLOW (A) 04/11/2020 1650   APPEARANCEUR CLOUDY (A) 04/11/2020 1650   APPEARANCEUR CLOUDY 01/15/2014 0209   LABSPEC 1.011 04/11/2020 1650   LABSPEC 1.014 01/15/2014 0209   PHURINE 5.0 04/11/2020 1650   GLUCOSEU NEGATIVE 04/11/2020 1650   GLUCOSEU NEGATIVE 01/15/2014 0209   HGBUR MODERATE (A) 04/11/2020 1650   BILIRUBINUR NEGATIVE 04/11/2020 1650   BILIRUBINUR NEGATIVE 01/15/2014 0209   KETONESUR NEGATIVE 04/11/2020 1650   PROTEINUR NEGATIVE 04/11/2020 1650   NITRITE POSITIVE (A) 04/11/2020 1650   LEUKOCYTESUR LARGE (A) 04/11/2020 1650   LEUKOCYTESUR 2+ 01/15/2014 0209   Sepsis Labs Invalid input(s): PROCALCITONIN,  WBC,  LACTICIDVEN Microbiology Recent Results (from the past 240 hour(s))  Respiratory Panel by RT PCR (Flu A&B, Covid) - Nasopharyngeal Swab     Status: None   Collection Time: 04/09/20  1:17 PM   Specimen: Nasopharyngeal Swab  Result Value Ref Range Status   SARS Coronavirus 2 by RT PCR NEGATIVE NEGATIVE Final    Comment: (NOTE) SARS-CoV-2 target nucleic acids are NOT DETECTED.  The SARS-CoV-2 RNA is generally detectable in upper respiratoy specimens during the acute phase of infection. The lowest concentration of SARS-CoV-2 viral copies this assay can detect is 131 copies/mL. A negative result does not preclude SARS-Cov-2 infection and should not be used as the sole basis for treatment or other patient management decisions. A negative result may occur with  improper specimen collection/handling, submission of specimen other than nasopharyngeal swab, presence of viral mutation(s) within the areas targeted by this assay, and inadequate number of viral copies (<131 copies/mL). A negative result must be combined with clinical observations, patient history, and epidemiological information. The expected result is Negative.  Fact Sheet for Patients:   PinkCheek.be  Fact Sheet for Healthcare Providers:  GravelBags.it  This test is no t yet approved or cleared by the [place] FDA and  has been authorized for detection and/or diagnosis of SARS-CoV-2 by FDA under an Emergency Use Authorization (EUA). This EUA will remain  in effect (meaning this test can be used) for the duration of the COVID-19 declaration under Section 564(b)(1) of the  Act, 21 U.S.C. section 360bbb-3(b)(1), unless the authorization is terminated or revoked sooner.     Influenza A by PCR NEGATIVE NEGATIVE Final   Influenza B by PCR NEGATIVE NEGATIVE Final    Comment: (NOTE) The Xpert Xpress SARS-CoV-2/FLU/RSV assay is intended as an aid in  the diagnosis of influenza from Nasopharyngeal swab specimens and  should not be used as a sole basis for treatment. Nasal washings and  aspirates are unacceptable for Xpert Xpress SARS-CoV-2/FLU/RSV  testing.  Fact Sheet for Patients: PinkCheek.be  Fact Sheet for Healthcare Providers: GravelBags.it  This test is not yet approved or cleared by the [place] FDA and  has been authorized for detection and/or diagnosis of SARS-CoV-2 by  FDA under an Emergency Use Authorization (EUA). This EUA will remain  in effect (meaning this test can be used) for the duration of the  Covid-19 declaration under Section 564(b)(1) of the Act, 21  U.S.C. section 360bbb-3(b)(1), unless the authorization is  terminated or revoked. Performed at [facility], [address] [place], [place] [address]   Urine Culture     Status: Abnormal   Collection Time: 04/11/20  4:50 PM   Specimen: Urine, Random  Result Value Ref Range Status   Specimen Description   Final    URINE, RANDOM Performed at [facility], [address] [place], [place] [address]    Special Requests   Final    NONE Performed at  [facility], [address] [place], [place] [address]    Culture >=100,000 COLONIES/mL ESCHERICHIA COLI (A)  Final   Report Status 04/14/2020 FINAL  Final   Organism ID, Bacteria ESCHERICHIA COLI (A)  Final      Susceptibility   Escherichia coli - MIC*    AMPICILLIN 4 SENSITIVE Sensitive     CEFAZOLIN <=4 SENSITIVE Sensitive     CEFTRIAXONE <=0.25 SENSITIVE Sensitive     CIPROFLOXACIN >=4 RESISTANT Resistant     GENTAMICIN <=1 SENSITIVE Sensitive     IMIPENEM <=0.25 SENSITIVE Sensitive     NITROFURANTOIN <=16 SENSITIVE Sensitive     TRIMETH/SULFA <=20 SENSITIVE Sensitive     AMPICILLIN/SULBACTAM <=2 SENSITIVE Sensitive     PIP/TAZO <=4 SENSITIVE Sensitive     * >=100,000 COLONIES/mL ESCHERICHIA COLI  SARS Coronavirus 2 by RT PCR (hospital order, performed in [place] hospital lab) Nasopharyngeal Nasopharyngeal Swab     Status: None   Collection Time: 04/17/20  2:17 PM   Specimen: Nasopharyngeal Swab  Result Value Ref Range Status   SARS Coronavirus 2 NEGATIVE NEGATIVE Final    Comment: (NOTE) SARS-CoV-2 target nucleic acids are NOT DETECTED.  The SARS-CoV-2 RNA is generally detectable in upper and lower respiratory specimens during the acute phase of infection. The lowest concentration of SARS-CoV-2 viral copies this assay can detect is 250 copies / mL. A negative result does not preclude SARS-CoV-2 infection and should not be used as the sole basis for treatment or other patient management decisions.  A negative result may occur with improper specimen collection / handling, submission of specimen other than nasopharyngeal swab, presence of viral mutation(s) within the areas targeted by this assay, and inadequate number of viral copies (<250 copies / mL). A negative result must be combined with clinical observations, patient history, and epidemiological information.  Fact Sheet for Patients:   StrictlyIdeas.no  Fact Sheet for  Healthcare Providers: BankingDealers.co.za  This test is not yet approved or  cleared by the [place] FDA and has been authorized  for detection and/or diagnosis of SARS-CoV-2 by FDA under an Emergency Use Authorization (EUA).  This EUA will remain in effect (meaning this test can be used) for the duration of the COVID-19 declaration under Section 564(b)(1) of the Act, 21 U.S.C. section 360bbb-3(b)(1), unless the authorization is terminated or revoked sooner.  Performed at [facility], [address] [place], [place] [address]     Time coordinating discharge: Over 30 minutes  SIGNED:  Lorella Nimrod, MD  [facility] 04/18/2020, 10:38 AM  If 7PM-7AM, please contact night-coverage [email or web address]  This record has been created using Systems analyst. Errors have been sought and corrected,but may not always be located. Such creation errors do not reflect on the standard of care.

## 2020-04-18 NOTE — Progress Notes (Signed)
Pulmonary Medicine          Date: 04/18/2020,   MRN# [id number] Madison Mcbride Mar 09, 1956     AdmissionWeight: (!) 148.8 kg                 CurrentWeight: (!) 145.1 kg  Referring physician: Dr. 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  She also reported associated symptoms of cough which was dry and nonproductive however denied chest pain flulike illness nausea vomiting or diarrhea.  On arrival she was found to be acutely hypoxemic at 65% SPO2 but this did respond to supplemental oxygen and she was later placed on BiPAP which made her feel better.  She was Covid negative on arrival, had a venous blood gas drawn which showed mild acidemia with hypercapnia and hypoxemia.  Pulmonary consultation was placed for additional evaluation of acute on chronic hypoxemic and hypercapnic respiratory failure.   04/13/20- Patient states she feels better, her LE edema is improved, abdomen still feels swollen per patient, breathing is improved.  10/3- patient evaluated at bedside, husband in room.  We discussed BiPAP and follow up on outpatient. She is on CPAP at home and knows how equipment and supplies work so it should be a fairly easy transition.   04/15/20- patient was unable to tolerate BIPAP. She states facemask interface was very uncomfortable and with pain around nasal bridge. She reports pressure felt too high and mask "kept blowing off my face".  Due to this she's tired/fatigues unable to sleep.   04/16/20- patient participating with PT/OT  doing better with very slight destaturations during therapy.  She had VQ negative for PE.  She has residual interstitial infiltrates with edema bilaterally, will re-institute diuresis. Noted patient on TID midodrine 10. Continue current scope of therapy.  Would recommend bariatric evaluation.  04/17/20- patient was able to tolerate BIPAP a little better. She complains of cough we discussed initionting tussinex while hospitalized.  CXR this am with persistent pulmonary edema interval improvement.   04/18/20- patient slept better last night. She used BIPAP with no issues. Net 6.8L negative. Continue with current plan. I discussed fluid restriction with patient today to help with euvolemia.     PAST MEDICAL HISTORY   Past Medical History:  Diagnosis Date  . Diabetes mellitus without complication ([place])   . Hypothyroidism   . RLS (restless legs syndrome)   . Thyroid cancer ([place]) 2007     SURGICAL HISTORY   Past Surgical History:  Procedure Laterality Date  . ABDOMINAL HYSTERECTOMY    . COLONOSCOPY WITH PROPOFOL N/A 08/26/2015   Procedure: COLONOSCOPY WITH PROPOFOL;  Surgeon: Manya Silvas, MD;  Location: [facility] ENDOSCOPY;  Service: Endoscopy;  Laterality: N/A;  . THYROIDECTOMY       FAMILY HISTORY   Family History  Problem Relation Age of Onset  . Breast cancer Neg Hx      SOCIAL HISTORY   Social History   Tobacco Use  . Smoking status: Never Smoker  . Smokeless tobacco: Never Used  Substance Use Topics  .  Alcohol use: No  . Drug use: No     MEDICATIONS    Home Medication:    Current Medication:  Current Facility-Administered Medications:  .  0.9 %  sodium chloride infusion, 250 mL, Intravenous, PRN, Ivor Costa, MD .  acetaminophen (TYLENOL) tablet 650 mg, 650 mg, Oral, Q6H PRN, Jennye Boroughs, MD, 650 mg at 04/14/20 0245 .  albuterol (VENTOLIN HFA) 108 (90 Base) MCG/ACT inhaler 2 puff, 2 puff, Inhalation, Q4H PRN, Ivor Costa, MD .  aspirin EC tablet 81 mg, 81  mg, Oral, Daily, Ivor Costa, MD, 81 mg at 04/18/20 0928 .  atorvastatin (LIPITOR) tablet 20 mg, 20 mg, Oral, Q2200, Ivor Costa, MD, 20 mg at 04/17/20 2329 .  buPROPion (WELLBUTRIN SR) 12 hr tablet 150 mg, 150 mg, Oral, Daily, Ivor Costa, MD, 150 mg at 04/18/20 0927 .  chlorpheniramine-HYDROcodone (TUSSIONEX) 10-8 MG/5ML suspension 5 mL, 5 mL, Oral, Q8H, Antwione Picotte, MD, 5 mL at 04/18/20 0928 .  cholecalciferol (VITAMIN D) tablet 1,000 Units, 1,000 Units, Oral, Daily, Ivor Costa, MD, 1,000 Units at 04/18/20 [phone] .  dextromethorphan-guaiFENesin (MUCINEX DM) 30-600 MG per 12 hr tablet 1 tablet, 1 tablet, Oral, BID PRN, Ivor Costa, MD, 1 tablet at 04/16/20 1728 .  enoxaparin (LOVENOX) injection 40 mg, 40 mg, Subcutaneous, Q12H, Ivor Costa, MD, 40 mg at 04/18/20 0933 .  furosemide (LASIX) tablet 40 mg, 40 mg, Oral, Daily, Lorella Nimrod, MD, 40 mg at 04/18/20 0928 .  gabapentin (NEURONTIN) capsule 300 mg, 300 mg, Oral, BID, Lorella Nimrod, MD, 300 mg at 04/18/20 8119 .  hydrALAZINE (APRESOLINE) injection 5 mg, 5 mg, Intravenous, Q2H PRN, Ivor Costa, MD .  hydrOXYzine (ATARAX/VISTARIL) tablet 10 mg, 10 mg, Oral, TID PRN, Ivor Costa, MD .  insulin aspart (novoLOG) injection 0-5 Units, 0-5 Units, Subcutaneous, QHS, Niu, Xilin, MD .  insulin aspart (novoLOG) injection 0-9 Units, 0-9 Units, Subcutaneous, TID WC, Ivor Costa, MD, 1 Units at 04/12/20 1756 .  levothyroxine (SYNTHROID) tablet 250 mcg, 250 mcg, Oral, Q0600, Ivor Costa, MD, 250 mcg at 04/18/20 0537 .  midodrine (PROAMATINE) tablet 10 mg, 10 mg, Oral, TID WC PRN, Lorella Nimrod, MD .  multivitamin with minerals tablet 1 tablet, 1 tablet, Oral, Daily, Ivor Costa, MD, 1 tablet at 04/18/20 0928 .  oxyCODONE (Oxy IR/ROXICODONE) immediate release tablet 5 mg, 5 mg, Oral, Q8H PRN, Jennye Boroughs, MD, 5 mg at 04/12/20 1302 .  rOPINIRole (REQUIP) tablet 2 mg, 2 mg, Oral, TID, Jennye Boroughs, MD, 2 mg at 04/18/20 0927 .  sodium chloride flush (NS) 0.9 %  injection 3 mL, 3 mL, Intravenous, Q12H, Ivor Costa, MD, 3 mL at 04/18/20 0935 .  sodium chloride flush (NS) 0.9 % injection 3 mL, 3 mL, Intravenous, PRN, Ivor Costa, MD, 3 mL at 04/09/20 2136 .  zinc sulfate capsule 220 mg, 220 mg, Oral, Daily, Ivor Costa, MD, 220 mg at 04/18/20 1478    ALLERGIES   Patient has no known allergies.     REVIEW OF SYSTEMS    Review of Systems:  Gen:  Denies  fever, sweats, chills weigh loss  HEENT: Denies blurred vision, double vision, ear pain, eye pain, hearing loss, nose bleeds, sore throat Cardiac:  No dizziness, chest pain or heaviness, chest tightness,edema Resp:   Denies cough or sputum porduction, shortness of breath,wheezing, hemoptysis,  Gi: Denies swallowing difficulty, stomach pain, nausea or vomiting, diarrhea, constipation, bowel incontinence Gu:  Denies bladder incontinence, burning urine Ext:   Denies Joint pain, stiffness or swelling  Skin: Denies  skin rash, easy bruising or bleeding or hives Endoc:  Denies polyuria, polydipsia , polyphagia or weight change Psych:   Denies depression, insomnia or hallucinations   Other:  All other systems negative   VS: BP 126/67 (BP Location: Left Arm)   Pulse (!) 54   Temp 99 F (37.2 C) (Oral)   Resp 20   Ht 5\' 5"  (1.651 m)   Wt (!) 145.1 kg   SpO2 100%   BMI 53.22 kg/m      PHYSICAL EXAM    GENERAL:NAD, no fevers, chills, no weakness no fatigue HEAD: Normocephalic, atraumatic.  EYES: Pupils equal, round, reactive to light. Extraocular muscles intact. No scleral icterus.  MOUTH: Moist mucosal membrane. Dentition intact. No abscess noted.  EAR, NOSE, THROAT: Clear without exudates. No external lesions.  NECK: Supple. No thyromegaly. No nodules. No JVD.  PULMONARY: Decreased breath sounds bilaterally without wheezing or rhonchorous lung sounds CARDIOVASCULAR: S1 and S2. Regular rate and rhythm. No murmurs, rubs, or gallops. No edema. Pedal pulses 2+ bilaterally.    GASTROINTESTINAL: Soft, nontender, nondistended. No masses. Positive bowel sounds. No hepatosplenomegaly.  Obese abdomen MUSCULOSKELETAL: No swelling, clubbing, or edema. Range of motion full in all extremities.  NEUROLOGIC: Cranial nerves II through XII are intact. No gross focal neurological deficits. Sensation intact. Reflexes intact.  SKIN: No ulceration, lesions, rashes, or cyanosis. Skin warm and dry. Turgor intact.  PSYCHIATRIC: Mood, affect within normal limits. The patient is awake, alert and oriented x 3. Insight, judgment intact.       IMAGING    NM Pulmonary Perfusion  Result Date: 04/15/2020 CLINICAL DATA:  Respiratory failure/shortness of breath. Elevated D-dimer EXAM: NUCLEAR MEDICINE PERFUSION LUNG SCAN TECHNIQUE: Perfusion images were obtained in multiple projections after intravenous injection of radiopharmaceutical. Views: Anterior, posterior, left lateral, right lateral, RPO, LPO, RAO, LAO. RADIOPHARMACEUTICALS:  4.063 mCi Tc-66m MAA IV COMPARISON:  Chest radiograph April 15, 2020 FINDINGS: The distribution of radiotracer uptake bilaterally is homogeneous and symmetric without focal perfusion defect. There is cardiomegaly. IMPRESSION: No appreciable perfusion defects. No findings indicative of pulmonary embolus. Cardiomegaly noted. Electronically Signed   By: Lowella Grip III M.D.   On: 04/15/2020 13:03   US RENAL  Result Date: 04/11/2020 CLINICAL DATA:  Acute renal failure EXAM: RENAL / URINARY TRACT ULTRASOUND COMPLETE COMPARISON:  Renal ultrasound 10/22/2019 FINDINGS: Right Kidney: Renal measurements: 10.8 x 6.6 x 6.2 cm = volume: 233 mL. Echogenicity is within normal limits. Mild cortical thinning and renal sinus lipomatosis. Simple appearing 1.4 x 1.3 x 1.3 cm cyst arising from the upper pole right kidney. No concerning renal mass, shadowing calculus or hydronephrosis. Left Kidney: Renal measurements: 13.5 x 7.2 x 6.4 cm = volume: 323.4 mL. Echogenicity is within  normal limits. Mild cortical thinning and renal sinus lipomatosis. No concerning renal mass, shadowing calculus or hydronephrosis. Bladder: Appears normal for degree of bladder distention. Other: None. IMPRESSION: Kidneys appear slightly enlarged bilaterally (upper limits normal for females 150 mL). With some chronic renal cortical thinning and renal sinus lipomatosis, can reflect a degree of chronic renal failure or diabetic nephropathy but with preservation of the parenchymal echogenicity arguing against a more acute medical renal disease. Simple 1.4 cm cyst in the upper pole right kidney. Electronically Signed   By: Lovena Le M.D.   On: 04/11/2020 19:19   DG Chest Port 1 View  Result Date: 04/17/2020 CLINICAL DATA:  Congestive heart failure EXAM: PORTABLE CHEST 1 VIEW COMPARISON:  04/15/2020 FINDINGS: Stable cardiomegaly.  Pulmonary vascular congestion with increased interstitial opacities bilaterally. No large pleural fluid collection. No pneumothorax. IMPRESSION: Findings suggestive of CHF with pulmonary edema. No appreciable interval change from prior. Electronically Signed   By: Davina Poke D.O.   On: 04/17/2020 08:17   DG Chest Port 1 View  Result Date: 04/15/2020 CLINICAL DATA:  64 year old female undergoing V/Q scan for respiratory failure. EXAM: PORTABLE CHEST 1 VIEW COMPARISON:  Portable chest 04/13/2020 and earlier. FINDINGS: Portable AP semi upright view at 1108 hours. Stable cardiomegaly and mediastinal contours. Mildly improved lung volumes. No pneumothorax, pleural effusion or consolidation. Bilateral pulmonary vascular congestion, although mildly regressed compared to 04/12/2020. Visualized tracheal air column is within normal limits. No acute osseous abnormality identified. IMPRESSION: Cardiomegaly with decreased but not resolved pulmonary interstitial edema since 04/12/2020. No new cardiopulmonary abnormality. Electronically Signed   By: Genevie Ann M.D.   On: 04/15/2020 11:38   DG  Chest Port 1 View  Result Date: 04/13/2020 CLINICAL DATA:  Acute on chronic CHF, thyroid cancer EXAM: PORTABLE CHEST 1 VIEW COMPARISON:  Chest radiograph from one day prior. FINDINGS: Stable cardiomediastinal silhouette with mild to moderate cardiomegaly. No pneumothorax. No pleural effusion. Mild pulmonary edema, similar. IMPRESSION: Mild congestive heart failure, similar. Electronically Signed   By: Ilona Sorrel M.D.   On: 04/13/2020 09:29   DG Chest Port 1 View  Result Date: 04/12/2020 CLINICAL DATA:  Short of breath.  Cough EXAM: PORTABLE CHEST 1 VIEW COMPARISON:  04/01/2020 FINDINGS: Enlarged cardiac silhouette with ectatic aorta. There is patchy bilateral airspace disease. No focal consolidation. No pneumothorax. Mild improvement in airspace opacities. IMPRESSION: Patchy bilateral airspace disease suggests pulmonary edema. Mild improvement from comparison exam. Electronically Signed   By: Suzy Bouchard M.D.   On: 04/12/2020 10:40   DG Chest Portable 1 View  Result Date: 04/09/2020 CLINICAL DATA:  Shortness of breath EXAM: PORTABLE CHEST 1 VIEW COMPARISON:  February 21, 2020 FINDINGS: There is cardiomegaly with pulmonary venous hypertension. There is mild interstitial edema. No consolidation. No adenopathy. No bone lesions. IMPRESSION: Cardiomegaly with pulmonary vascular congestion. Mild interstitial edema. Overall appearance is felt to be indicative of congestive heart failure. No consolidation. Electronically Signed   By: Lowella Grip III M.D.   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 -Likely due to thoracic restriction with obesity hypoventilation syndrome and obstructive sleep apnea overlap -spirometry with graph to evaluate residual lung function and stratification of ventilatory defect -reviewed Arterial blood gas to allow patient for BiPAP qualification for home use -Consultation with [facility] for medical  equipment and supplies-BIPAP is being utilized   -Patient would benefit from bariatric evaluation -Complicated by fluid shifts secondary to heart failure and renal impairment -Would benefit from occupational and physical therapy -D-dimer to rule out pulmonary venous thromboembolism-VQ negative -RD nutritional evaluation for appropriate dietary changes with morbid obesity , hypothyroid and DM2, as well as bariatric evaluation.     Obstructive sleep apnea  - patient uses CPAP at home at 19cm H2O  - BIPAP while in house QHS   Thank you for allowing me to participate in the care of this patient.   Patient/Family are satisfied with care plan and all questions have been answered.   This document was prepared using Dragon voice recognition software and may include unintentional dictation errors.     Ottie Glazier, M.D.  Division of [place]

## 2020-04-18 NOTE — Progress Notes (Signed)
Report called to [facility] at [facility], [place] at [phone].

## 2020-04-18 NOTE — Progress Notes (Signed)
Occupational Therapy Treatment Patient Details Name: Madison Mcbride MRN: [id number] DOB: 08-21-1955 Today's Date: 04/18/2020    History of present illness 64 y.o. female presented to ED with shortness of breath. Pt admitted with acute on chronic diastolic dysfunction congestive heart failure multifactorial in nature including acute on chronic kidney dysfunction and PMHx of CKD3, DM, HTN, HLD, and sleep apnea without evidence of myocardial infarction.   OT comments  Pt seen for OT tx this date to f/u re: safety with ADLs/ADL mobility. OT facilitates ed re: importance of doing as much for herself as possible regarding self care to increase chances of returning to baseline indepdence. Pt with good understanding and makes effort to participate as cued while OT engages pt in all the following ADLs at bed level: MIN A for UB bathing/drying/dressing bed level, MAX A +2 for rolling (MIN tactile/verbal cues for hand placement to facilitate rolling), MAX A +2 for LB bathing/drying/dressing. RN and CNA assist throughout as well, instructed to encourage higher level indep pt participation. Continue to anticipate SNF as most appropriate d/c recommendation given pt's current fxl level versus PLOF.    Follow Up Recommendations  SNF    Equipment Recommendations  Other (comment) (defer to next level of care, potential need for hospital bed)    Recommendations for Other Services      Precautions / Restrictions Precautions Precautions: Fall Restrictions Weight Bearing Restrictions: No       Mobility Bed Mobility Overal bed mobility: Needs Assistance Bed Mobility: Rolling Rolling: Max assist;+2 for physical assistance            Transfers                      Balance                                           ADL either performed or assessed with clinical judgement   ADL Overall ADL's : Needs assistance/impaired         Upper Body Bathing: Minimal  assistance;Bed level   Lower Body Bathing: Maximal assistance;Bed level;+2 for physical assistance Lower Body Bathing Details (indicate cue type and reason): rollling technique Upper Body Dressing : Moderate assistance;Bed level   Lower Body Dressing: Maximal assistance;Total assistance;Bed level Lower Body Dressing Details (indicate cue type and reason): rolling                     Vision Patient Visual Report: No change from baseline     Perception     Praxis      Cognition Arousal/Alertness: Awake/alert Behavior During Therapy: WFL for tasks assessed/performed Overall Cognitive Status: Within Functional Limits for tasks assessed                                          Exercises Other Exercises Other Exercises: OT facilitates ed re: importance of doing as much for herself as possible regarding self care to increase chances of returning to baseline indepdence. Pt with good understanding and makes effort to participate as cued while OT engages pt in all the following ADLs at bed level: MIN A for UB bathing/drying/dressing bed level, MAX A +2 for rolling, MAX A +2 for LB bathing/drying/dressing. RN and CNA assist throughout as well,  instructed to encourage higher level indep pt participation.   Shoulder Instructions       General Comments      Pertinent Vitals/ Pain       Pain Assessment: Faces Faces Pain Scale: Hurts a little bit Pain Location: back Pain Descriptors / Indicators: Sore;Discomfort Pain Intervention(s): Limited activity within patient's tolerance;Monitored during session  Home Living                                          Prior Functioning/Environment              Frequency  Min 2X/week        Progress Toward Goals  OT Goals(current goals can now be found in the care plan section)  Progress towards OT goals: Progressing toward goals  Acute Rehab OT Goals OT Goal Formulation: With  patient/family Time For Goal Achievement: 04/25/20 Potential to Achieve Goals: Good  Plan Discharge plan remains appropriate    Co-evaluation                 AM-PAC OT "6 Clicks" Daily Activity     Outcome Measure   Help from another person eating meals?: None Help from another person taking care of personal grooming?: A Little Help from another person toileting, which includes using toliet, bedpan, or urinal?: Total Help from another person bathing (including washing, rinsing, drying)?: A Lot Help from another person to put on and taking off regular upper body clothing?: A Little Help from another person to put on and taking off regular lower body clothing?: A Lot 6 Click Score: 15    End of Session Equipment Utilized During Treatment: Oxygen  OT Visit Diagnosis: Other abnormalities of gait and mobility (R26.89);Muscle weakness (generalized) (M62.81);History of falling (Z91.81);Pain   Activity Tolerance Patient limited by pain   Patient Left in bed;with call bell/phone within reach (with RN/CNA present in room prepping pt for d/c)   Nurse Communication Other (comment)        Time: 9179-1505 OT Time Calculation (min): 43 min  Charges: OT General Charges $OT Visit: 1 Visit OT Treatments $Self Care/Home Management : 23-37 mins $Therapeutic Activity: 8-22 mins  Gerrianne Scale, [place], OTR/L ascom [phone] 04/18/20, 3:57 PM

## 2020-04-30 ENCOUNTER — Ambulatory Visit: Payer: BC Managed Care – PPO | Admitting: Family

## 2020-08-15 ENCOUNTER — Encounter: Payer: Medicare Other | Attending: Physician Assistant | Admitting: Physician Assistant

## 2020-08-15 ENCOUNTER — Other Ambulatory Visit
Admission: RE | Admit: 2020-08-15 | Discharge: 2020-08-15 | Disposition: A | Discharge status: Home / Self Care | Payer: Medicare Other | Source: Ambulatory Visit | Attending: Physician Assistant | Admitting: Physician Assistant

## 2020-08-15 ENCOUNTER — Other Ambulatory Visit: Payer: Self-pay

## 2020-08-15 DIAGNOSIS — I5042 Chronic combined systolic (congestive) and diastolic (congestive) heart failure: Secondary | ICD-10-CM | POA: Diagnosis not present

## 2020-08-15 DIAGNOSIS — L97828 Non-pressure chronic ulcer of other part of left lower leg with other specified severity: Secondary | ICD-10-CM | POA: Diagnosis not present

## 2020-08-15 DIAGNOSIS — I890 Lymphedema, not elsewhere classified: Secondary | ICD-10-CM | POA: Insufficient documentation

## 2020-08-15 DIAGNOSIS — I110 Hypertensive heart disease with heart failure: Secondary | ICD-10-CM | POA: Insufficient documentation

## 2020-08-15 DIAGNOSIS — L089 Local infection of the skin and subcutaneous tissue, unspecified: Secondary | ICD-10-CM | POA: Insufficient documentation

## 2020-08-15 DIAGNOSIS — E11622 Type 2 diabetes mellitus with other skin ulcer: Secondary | ICD-10-CM | POA: Diagnosis not present

## 2020-08-15 DIAGNOSIS — A499 Bacterial infection, unspecified: Secondary | ICD-10-CM | POA: Diagnosis not present

## 2020-08-18 LAB — AEROBIC CULTURE W GRAM STAIN (SUPERFICIAL SPECIMEN)

## 2020-08-21 NOTE — Progress Notes (Signed)
Madison Mcbride ([id number]) Visit Report for 08/15/2020 Chief Complaint Document Details Patient Name: Madison Mcbride, Madison Mcbride Date of Service: 08/15/2020 8:30 AM Medical Record Number: [id number] Patient Account Number: [id number] Date of Birth/Sex: 1955/10/18 (65 y.o. F) Treating RN: Dolan Amen Primary Care Provider: Emily Filbert Other Clinician: Referring Provider: Emily Filbert Treating Provider/Extender: Skipper Cliche in Treatment: 0 Information Obtained from: Patient Chief Complaint Bilateral Lymphedema with Left LE Ulcers Electronic Signature(s) Signed: 08/15/2020 9:18:33 AM By: Worthy Keeler PA-C Entered By: Worthy Keeler on 08/15/2020 09:18:33 Madison Mcbride, Madison Mcbride ([id number]) -------------------------------------------------------------------------------- Debridement Details Patient Name: Madison Mcbride Date of Service: 08/15/2020 8:30 AM Medical Record Number: [id number] Patient Account Number: [id number] Date of Birth/Sex: [birth year] (65 y.o. F) Treating RN: Dolan Amen Primary Care Provider: Emily Filbert Other Clinician: Referring Provider: Emily Filbert Treating Provider/Extender: Skipper Cliche in Treatment: 0 Debridement Performed for Wound #1 Left,Medial Lower Leg Assessment: Performed By: Physician Tommie Sams., PA-C Debridement Type: Debridement Level of Consciousness (Pre- Awake and Alert procedure): Pre-procedure Verification/Time Out Yes - 09:40 Taken: Start Time: 09:40 Pain Control: Lidocaine 4% Topical Solution Total Area Debrided (L x W): 4.5 (cm) x 4.5 (cm) = 20.25 (cm) Tissue and other material Viable, Non-Viable, Eschar, Slough, Slough debrided: Level: Non-Viable Tissue Debridement Description: Selective/Open Wound Instrument: Curette Bleeding: Minimum Hemostasis Achieved: Pressure Response to Treatment: Procedure was tolerated well Level of Consciousness (Post- Awake and Alert procedure): Post Debridement Measurements of Total  Wound Length: (cm) 9 Width: (cm) 9 Depth: (cm) 0.2 Volume: (cm) 12.723 Character of Wound/Ulcer Post Debridement: Stable Post Procedure Diagnosis Same as Pre-procedure Electronic Signature(s) Signed: 08/15/2020 12:35:51 PM By: Charlett Nose RN Signed: 08/15/2020 6:00:33 PM By: Worthy Keeler PA-C Entered By: Georges Mouse, Minus Breeding on 08/15/2020 09:44:56 Madison Mcbride ([id number]) -------------------------------------------------------------------------------- HPI Details Patient Name: Madison Mcbride Date of Service: 08/15/2020 8:30 AM Medical Record Number: [id number] Patient Account Number: [id number] Date of Birth/Sex: [birth year] (65 y.o. F) Treating RN: Dolan Amen Primary Care Provider: Emily Filbert Other Clinician: Referring Provider: Emily Filbert Treating Provider/Extender: Skipper Cliche in Treatment: 0 History of Present Illness HPI Description: 08/15/2020 upon evaluation today patient presents today for bilateral lower extremity lymphedema. She actually 1 month ago began being seen by Dr. Sabra Heck who did wrap her for about 2 weeks. Then they stopped wrapping this. The patient states that following that time she had blisters that arose left greater than right lower extremity. She notes that she also cannot wear compression stockings right now she does have them but really was not wearing them prior to all the blistering beginning either. She does have [facility] coming out for physical therapy although she has not really been seen by them for the wound itself based on what she tells me today. Her most recent hemoglobin A1c was 5.7. She is a type II diabetic and is on medication for this. She has finished the Keflex that she was previously given by Dr. Sabra Heck. She does using a walker for ambulation. With that being said she tells me that she can get up and move around with a walker decently well which is great news. The patient does have other than the diagnosis of  lymphedema also diabetes mellitus type 2, hypertension, congestive heart failure, and sleep apnea. She tells me that she typically sleeps in her recliner due to the fact that it is easier to get up and down when she has to go to the restroom especially in the middle the night when  she is somewhat alone. This also unfortunately means that she is often sleeping with her feet on the ground which is the worst position to be and to be honest. I discussed all this with her among other items. She is never been told about lymphedema pumps also think that could be beneficial for her based on what I am seeing currently. Electronic Signature(s) Signed: 08/15/2020 5:55:43 PM By: Worthy Keeler PA-C Entered By: Worthy Keeler on 08/15/2020 17:55:43 Madison Mcbride, Madison Mcbride ([id number]) -------------------------------------------------------------------------------- Physical Exam Details Patient Name: Madison Mcbride Date of Service: 08/15/2020 8:30 AM Medical Record Number: [id number] Patient Account Number: [id number] Date of Birth/Sex: 06-24-1956 (65 y.o. F) Treating RN: Dolan Amen Primary Care Provider: Emily Filbert Other Clinician: Referring Provider: Emily Filbert Treating Provider/Extender: Skipper Cliche in Treatment: 0 Constitutional sitting or standing blood pressure is within target range for patient.. pulse regular and within target range for patient.[name] respirations regular, non- labored and within target range for patient.[name] temperature within target range for patient.. Obese and well-hydrated in no acute distress. Eyes conjunctiva clear no eyelid edema noted. pupils equal round and reactive to light and accommodation. Ears, Nose, Mouth, and Throat no gross abnormality of ear auricles or external auditory canals. normal hearing noted during conversation. mucus membranes moist. Respiratory normal breathing without difficulty. Cardiovascular 2+ dorsalis pedis/posterior tibialis pulses. Patient  does have stage III bilateral lymphedema although it is on the milder side.. Musculoskeletal Patient unable to walk without assistance. She does ambulate with a walker.. no significant deformity or arthritic changes, no loss or range of motion, no clubbing. Psychiatric this patient is able to make decisions and demonstrates good insight into disease process. Alert and Oriented x 3. pleasant and cooperative. Notes Upon inspection patient's wounds again appear to be more lymphedema type breakdown than anything else. There does not appear to be any signs of infection which is great news systemically although she is having some issues with erythema this may just be due to the swelling although infection cannot be ruled out. Electronic Signature(s) Signed: 08/15/2020 5:56:59 PM By: Worthy Keeler PA-C Entered By: Worthy Keeler on 08/15/2020 17:56:59 Madison Mcbride, Madison Mcbride ([id number]) -------------------------------------------------------------------------------- Physician Orders Details Patient Name: Madison Mcbride Date of Service: 08/15/2020 8:30 AM Medical Record Number: [id number] Patient Account Number: [id number] Date of Birth/Sex: [birth year] (64 y.o. F) Treating RN: Dolan Amen Primary Care Provider: Emily Filbert Other Clinician: Referring Provider: Emily Filbert Treating Provider/Extender: Skipper Cliche in Treatment: 0 Verbal / Phone Orders: No Diagnosis Coding ICD-10 Coding Code Description I89.0 Lymphedema, not elsewhere classified L97.828 Non-pressure chronic ulcer of other part of left lower leg with other specified severity E11.622 Type 2 diabetes mellitus with other skin ulcer I10 Essential (primary) hypertension I50.42 Chronic combined systolic (congestive) and diastolic (congestive) heart failure G47.30 Sleep apnea, unspecified Follow-up Appointments Wound #1 Left,Medial Lower Leg o Return Appointment in 1 week. o Nurse Visit as needed [place] [facility]1  [place] for wound care. May utilize formulary equivalent dressing for wound treatment orders unless otherwise specified. Home Health Nurse may visit PRN to address patientos wound care needs. o Scheduled days for dressing changes to be completed; exception, patient has scheduled wound care visit that day. - Change bilateral compression wraps/dressings Monday, Patient will be seen in clinic Thursday o **Please direct any NON-WOUND related issues/requests for orders to patient's Primary Care Physician. **If current dressing causes regression in wound condition, may  D/C ordered dressing product/s and apply Normal Saline Moist Dressing daily until next [place] or Other MD appointment. **Notify [facility] of regression in wound condition at [phone]. Edema Control - Lymphedema / Segmental Compressive Device / Other Bilateral Lower Extremities o Optional: One layer of unna paste to top of compression wrap (to act as an anchor). o 4 Layer Compression System (Left, Right, Bilateral) Lymphedema. o Elevate, Exercise Daily and Avoid Standing for Long Periods of Time. o Elevate legs to the level of the heart and pump ankles as often as possible o Elevate leg(s) parallel to the floor when sitting. Medications-Please add to medication list. Wound #1 Left,Medial Lower Leg o P.O. Antibiotics - Bactrim Wound Treatment Wound #1 - Lower Leg Wound Laterality: Left, Medial Cleanser: Soap and Water 2 x Per Week/30 Days Discharge Instructions: Gently cleanse wound with antibacterial soap, rinse and pat dry prior to dressing wounds Primary Dressing: Silvercel 4 1/4x 4 1/4 (in/in) (Generic) 2 x Per Week/30 Days Discharge Instructions: Apply Silvercel 4 1/4x 4 1/4 (in/in) as instructed Secondary Dressing: Xtrasorb Large 6x9 (in/in) (Generic) 2 x Per Week/30 Days Discharge Instructions: Apply to  wound as directed. Do not cut. Compression Wrap: Medichoice 4 layer Compression System, 35-40 mmHG (Generic) 2 x Per Week/30 Days Discharge Instructions: Apply multi-layer wrap as directed. Compression Wrap: Unna w/Calamine, 4x10 (in/yd) (Generic) 2 x Per Week/30 Days Madison Mcbride, Madison S. ([id number]) Discharge Instructions: Apply unna paste -3 finger-widths below knee to anchor compression wrap in place. Laboratory o Bacteria identified in Wound by Culture (MICRO) - (ICD10 L97.828 - Non-pressure chronic ulcer of other part of left lower leg with other specified severity) oooo LOINC Code: 9485-4 oooo Convenience Name: Wound culture routine Patient Medications Allergies: No Known Drug Allergies Notifications Medication Indication Start End Bactrim DS 08/16/2020 DOSE 1 - oral 800 mg-160 mg tablet - 1 tablet oral taken 2 times per day for 14 days Electronic Signature(s) Signed: 08/16/2020 8:23:56 AM By: Worthy Keeler PA-C Previous Signature: 08/15/2020 4:33:42 PM Version By: Georges Mouse, Minus Breeding RN Previous Signature: 08/15/2020 6:00:33 PM Version By: Worthy Keeler PA-C Previous Signature: 08/15/2020 3:14:53 PM Version By: Georges Mouse, Minus Breeding RN Previous Signature: 08/15/2020 12:35:51 PM Version By: Georges Mouse, Minus Breeding RN Entered By: Worthy Keeler on 08/16/2020 08:23:56 Madison Mcbride, Madison Mcbride ([id number]) -------------------------------------------------------------------------------- Problem List Details Patient Name: Madison Mcbride Date of Service: 08/15/2020 8:30 AM Medical Record Number: [id number] Patient Account Number: [id number] Date of Birth/Sex: 07/24/1955 (64 y.o. F) Treating RN: Dolan Amen Primary Care Provider: Emily Filbert Other Clinician: Referring Provider: Emily Filbert Treating Provider/Extender: Skipper Cliche in Treatment: 0 Active Problems ICD-10 Encounter Code Description Active Date MDM Diagnosis I89.0 Lymphedema, not elsewhere classified 08/15/2020 No  Yes L97.828 Non-pressure chronic ulcer of other part of left lower leg with other 08/15/2020 No Yes specified severity E11.622 Type 2 diabetes mellitus with other skin ulcer 08/15/2020 No Yes I10 Essential (primary) hypertension 08/15/2020 No Yes I50.42 Chronic combined systolic (congestive) and diastolic (congestive) heart 08/15/2020 No Yes failure G47.30 Sleep apnea, unspecified 08/15/2020 No Yes Inactive Problems Resolved Problems Electronic Signature(s) Signed: 08/15/2020 9:18:06 AM By: Worthy Keeler PA-C Entered By: Worthy Keeler on 08/15/2020 09:18:05 Madison Mcbride ([id number]) -------------------------------------------------------------------------------- Progress Note Details Patient Name: Madison Mcbride Date of Service: 08/15/2020 8:30 AM Medical Record Number: [id number] Patient Account Number: [id number] Date of Birth/Sex: 1955/12/03 (64 y.o. F) Treating RN: Dolan Amen Primary Care Provider: Emily Filbert Other Clinician: Referring Provider: Emily Filbert Treating  Provider/Extender: Jeri Cos Weeks in Treatment: 0 Subjective Chief Complaint Information obtained from Patient Bilateral Lymphedema with Left LE Ulcers History of Present Illness (HPI) 08/15/2020 upon evaluation today patient presents today for bilateral lower extremity lymphedema. She actually 1 month ago began being seen by Dr. Sabra Heck who did wrap her for about 2 weeks. Then they stopped wrapping this. The patient states that following that time she had blisters that arose left greater than right lower extremity. She notes that she also cannot wear compression stockings right now she does have them but really was not wearing them prior to all the blistering beginning either. She does have [facility] coming out for physical therapy although she has not really been seen by them for the wound itself based on what she tells me today. Her most recent hemoglobin A1c was 5.7. She is a type II diabetic and is on  medication for this. She has finished the Keflex that she was previously given by Dr. Sabra Heck. She does using a walker for ambulation. With that being said she tells me that she can get up and move around with a walker decently well which is great news. The patient does have other than the diagnosis of lymphedema also diabetes mellitus type 2, hypertension, congestive heart failure, and sleep apnea. She tells me that she typically sleeps in her recliner due to the fact that it is easier to get up and down when she has to go to the restroom especially in the middle the night when she is somewhat alone. This also unfortunately means that she is often sleeping with her feet on the ground which is the worst position to be and to be honest. I discussed all this with her among other items. She is never been told about lymphedema pumps also think that could be beneficial for her based on what I am seeing currently. Patient History Information obtained from Patient. Allergies No Known Drug Allergies Family History No family history of Diabetes. Social History Never smoker, Marital Status - Married, Alcohol Use - Rarely, Drug Use - No History, Caffeine Use - Daily. Medical History Respiratory Patient has history of Sleep Apnea Cardiovascular Patient has history of Hypertension Endocrine Patient has history of Type II Diabetes Musculoskeletal Patient has history of Osteoarthritis Neurologic Patient has history of Neuropathy - feet Patient is treated with Oral Agents. Blood sugar is not tested. Medical And Surgical History Notes Oncologic Thyroid removed. Review of Systems (ROS) Eyes Denies complaints or symptoms of Dry Eyes, Vision Changes, Glasses / Contacts. Ear/Nose/Mouth/Throat Denies complaints or symptoms of Difficult clearing ears, Sinusitis. Hematologic/Lymphatic Denies complaints or symptoms of Bleeding / Clotting Disorders, Human Immunodeficiency Virus. Respiratory Denies  complaints or symptoms of Chronic or frequent coughs, Shortness of Breath. Cardiovascular Complains or has symptoms of LE edema - bilateral lower extremities. Gastrointestinal Denies complaints or symptoms of Frequent diarrhea, Nausea, Vomiting. Endocrine Complains or has symptoms of Thyroid disease - removed 2013. Madison Mcbride, Madison Mcbride ([id number]) Genitourinary Denies complaints or symptoms of Kidney failure/ Dialysis, Incontinence/dribbling. Immunological Denies complaints or symptoms of Hives, Itching. Integumentary (Skin) Complains or has symptoms of Wounds. Denies complaints or symptoms of Bleeding or bruising tendency, Breakdown, Swelling. Psychiatric Denies complaints or symptoms of Anxiety, Claustrophobia. Objective Constitutional sitting or standing blood pressure is within target range for patient.. pulse regular and within target range for patient.[name] respirations regular, non- labored and within target range for patient.[name] temperature within target range for patient.. Obese and well-hydrated in no acute distress. Vitals Time Taken:  8:35 AM, Height: 65 in, Source: Stated, Weight: 300 lbs, Source: Stated, BMI: 49.9, Temperature: 98.3 F, Pulse: 77 bpm, Respiratory Rate: 18 breaths/min, Blood Pressure: 116/62 mmHg. Eyes conjunctiva clear no eyelid edema noted. pupils equal round and reactive to light and accommodation. Ears, Nose, Mouth, and Throat no gross abnormality of ear auricles or external auditory canals. normal hearing noted during conversation. mucus membranes moist. Respiratory normal breathing without difficulty. Cardiovascular 2+ dorsalis pedis/posterior tibialis pulses. Patient does have stage III bilateral lymphedema although it is on the milder side.. Musculoskeletal Patient unable to walk without assistance. She does ambulate with a walker.. no significant deformity or arthritic changes, no loss or range of motion, no clubbing. Psychiatric this patient is able  to make decisions and demonstrates good insight into disease process. Alert and Oriented x 3. pleasant and cooperative. General Notes: Upon inspection patient's wounds again appear to be more lymphedema type breakdown than anything else. There does not appear to be any signs of infection which is great news systemically although she is having some issues with erythema this may just be due to the swelling although infection cannot be ruled out. Integumentary (Hair, Skin) Wound #1 status is Open. Original cause of wound was Blister. The wound is located on the Left,Medial Lower Leg. The wound measures 9cm length x 9cm width x 0.1cm depth; 63.617cm^2 area and 6.362cm^3 volume. There is a medium amount of serous drainage noted. There is no granulation within the wound bed. There is a large (67-100%) amount of necrotic tissue within the wound bed including Eschar. Assessment Active Problems ICD-10 Lymphedema, not elsewhere classified Non-pressure chronic ulcer of other part of left lower leg with other specified severity Type 2 diabetes mellitus with other skin ulcer Essential (primary) hypertension Chronic combined systolic (congestive) and diastolic (congestive) heart failure Sleep apnea, unspecified Lukens, Kendyl S. ([id number]) Procedures Wound #1 Pre-procedure diagnosis of Wound #1 is a Lymphedema located on the Left,Medial Lower Leg . There was a Selective/Open Wound Non-Viable Tissue Debridement with a total area of 20.25 sq cm performed by Tommie Sams., PA-C. With the following instrument(s): Curette to remove Viable and Non-Viable tissue/material. Material removed includes Eschar and Slough and after achieving pain control using Lidocaine 4% Topical Solution. A time out was conducted at 09:40, prior to the start of the procedure. A Minimum amount of bleeding was controlled with Pressure. The procedure was tolerated well. Post Debridement Measurements: 9cm length x 9cm width x 0.2cm  depth; 12.723cm^3 volume. Character of Wound/Ulcer Post Debridement is stable. Post procedure Diagnosis Wound #1: Same as Pre-Procedure Pre-procedure diagnosis of Wound #1 is a Lymphedema located on the Left,Medial Lower Leg . There was a Four Layer Compression Therapy Procedure with a pre-treatment ABI of 1.1 by Dolan Amen, RN. Post procedure Diagnosis Wound #1: Same as Pre-Procedure There was a Four Layer Compression Therapy Procedure with a pre-treatment ABI of 1.3 by Dolan Amen, RN. Post procedure Diagnosis Wound #: Same as Pre-Procedure Plan Follow-up Appointments: Wound #1 Left,Medial Lower Leg: Return Appointment in 1 week. Nurse Visit as needed Home Health: Wound #1 Left,Medial Lower Leg: [place]: - [place] for wound care. May utilize formulary equivalent dressing for wound treatment orders unless otherwise specified. Home Health Nurse may visit PRN to address patient s wound care needs. Scheduled days for dressing changes to be completed; exception, patient has scheduled wound care visit that day. - Change bilateral compression wraps/dressings Monday, Patient will be seen in clinic Thursday **Please direct  any NON-WOUND related issues/requests for orders to patient's Primary Care Physician. **If current dressing causes regression in wound condition, may D/C ordered dressing product/s and apply Normal Saline Moist Dressing daily until next [place] or Other MD appointment. **Notify [facility] of regression in wound condition at [phone]. Edema Control - Lymphedema / Segmental Compressive Device / Other: Optional: One layer of unna paste to top of compression wrap (to act as an anchor). 4 Layer Compression System (Left, Right, Bilateral) Lymphedema. Elevate, Exercise Daily and Avoid Standing for Long Periods of Time. Elevate legs to the level of the heart and pump ankles as often as possible Elevate leg(s) parallel  to the floor when sitting. Medications-Please add to medication list.: Wound #1 Left,Medial Lower Leg: P.O. Antibiotics - Bactrim Laboratory ordered were: Wound culture routine The following medication(s) was prescribed: Bactrim DS oral 800 mg-160 mg tablet 1 1 tablet oral taken 2 times per day for 14 days starting 08/16/2020 WOUND #1: - Lower Leg Wound Laterality: Left, Medial Cleanser: Soap and Water 2 x Per Week/30 Days Discharge Instructions: Gently cleanse wound with antibacterial soap, rinse and pat dry prior to dressing wounds Primary Dressing: Silvercel 4 1/4x 4 1/4 (in/in) (Generic) 2 x Per Week/30 Days Discharge Instructions: Apply Silvercel 4 1/4x 4 1/4 (in/in) as instructed Secondary Dressing: Xtrasorb Large 6x9 (in/in) (Generic) 2 x Per Week/30 Days Discharge Instructions: Apply to wound as directed. Do not cut. Compression Wrap: Medichoice 4 layer Compression System, 35-40 mmHG (Generic) 2 x Per Week/30 Days Discharge Instructions: Apply multi-layer wrap as directed. Compression Wrap: Unna w/Calamine, 4x10 (in/yd) (Generic) 2 x Per Week/30 Days Discharge Instructions: Apply unna paste -3 finger-widths below knee to anchor compression wrap in place. 1. Would recommend currently that we actually go ahead and initiate treatment here with a 4-layer compression wrap bilaterally I think this is good to be the best thing to do to try to keep the edema under good control. 2. I am also can recommend that we use XtraSorb over the open areas of the legs in order to catch the drainage. 3. I am also can recommend the patient needs to be elevating her legs much as possible try to keep edema under control. Also discussed with her that she is getting need some type of compression ongoing once we get her healed in order to make sure that she continues to prevent new areas from occurring in the future. I explained that if she does not wear compression daily for the rest of her life that she is can  be at risk of developing new wounds like what she is experiencing right now in the future and frequently. The patient was somewhat disheartened to hear this but KAMY, [place]. ([id number]) nonetheless I think it is a good thing for her to hear now so that she can prepare herself. 4. Also discussed that she needs to be elevating her legs and not sleeping with her feet on the ground. Ideally would be to be in the bed although obviously if she cannot do this she at least needs to have her legs elevated and the lift chair. 5. I also suggested that we could look into lymphedema pumps she does have stage III lymphedema. With that being said right now I think we need to hold off until we complete the required course of treatment with compression therapy. 6. I am also going recommend that we probably look into ordering her Wallie Char wraps or something of the equivalent. With  that being said I would wait to get her legs little bit smaller with compression before ordering these for her. We will see patient back for reevaluation in 1 week here in the clinic. If anything worsens or changes patient will contact our office for additional recommendations. Electronic Signature(s) Signed: 08/16/2020 8:24:33 AM By: Worthy Keeler PA-C Previous Signature: 08/15/2020 5:59:31 PM Version By: Worthy Keeler PA-C Entered By: Worthy Keeler on 08/16/2020 08:24:32 Madison Mcbride, Madison Mcbride ([id number]) -------------------------------------------------------------------------------- ROS/PFSH Details Patient Name: Madison Mcbride Date of Service: 08/15/2020 8:30 AM Medical Record Number: [id number] Patient Account Number: [id number] Date of Birth/Sex: 05-Jun-1956 (64 y.o. F) Treating RN: Cornell Barman Primary Care Provider: Emily Filbert Other Clinician: Referring Provider: Emily Filbert Treating Provider/Extender: Skipper Cliche in Treatment: 0 Information Obtained From Patient Eyes Complaints and Symptoms: Negative for: Dry  Eyes; Vision Changes; Glasses / Contacts Ear/Nose/Mouth/Throat Complaints and Symptoms: Negative for: Difficult clearing ears; Sinusitis Hematologic/Lymphatic Complaints and Symptoms: Negative for: Bleeding / Clotting Disorders; Human Immunodeficiency Virus Respiratory Complaints and Symptoms: Negative for: Chronic or frequent coughs; Shortness of Breath Medical History: Positive for: Sleep Apnea Cardiovascular Complaints and Symptoms: Positive for: LE edema - bilateral lower extremities Medical History: Positive for: Hypertension Gastrointestinal Complaints and Symptoms: Negative for: Frequent diarrhea; Nausea; Vomiting Endocrine Complaints and Symptoms: Positive for: Thyroid disease - removed 2013 Medical History: Positive for: Type II Diabetes Treated with: Oral agents Blood sugar tested every day: No Genitourinary Complaints and Symptoms: Negative for: Kidney failure/ Dialysis; Incontinence/dribbling Immunological Complaints and Symptoms: Negative for: Hives; Itching Madison Mcbride, Madison S. ([id number]) Integumentary (Skin) Complaints and Symptoms: Positive for: Wounds Negative for: Bleeding or bruising tendency; Breakdown; Swelling Psychiatric Complaints and Symptoms: Negative for: Anxiety; Claustrophobia Musculoskeletal Medical History: Positive for: Osteoarthritis Neurologic Medical History: Positive for: Neuropathy - feet Oncologic Medical History: Past Medical History Notes: Thyroid removed. Immunizations Pneumococcal Vaccine: Received Pneumococcal Vaccination: No Implantable Devices None Family and Social History Diabetes: No; Never smoker; Marital Status - Married; Alcohol Use: Rarely; Drug Use: No History; Caffeine Use: Daily Electronic Signature(s) Signed: 08/15/2020 6:00:33 PM By: Worthy Keeler PA-C Signed: 08/20/2020 9:03:26 PM By: Gretta Cool, BSN, RN, CWS, Kim RN, BSN Entered By: Gretta Cool, BSN, RN, CWS, Kim on 08/15/2020 08:56:35 Madison Mcbride, Madison Mcbride  ([id number]) -------------------------------------------------------------------------------- [place] Details Patient Name: Madison Mcbride Date of Service: 08/15/2020 Medical Record Number: [id number] Patient Account Number: [id number] Date of Birth/Sex: April 18, 1956 (64 y.o. F) Treating RN: Dolan Amen Primary Care Provider: Emily Filbert Other Clinician: Referring Provider: Emily Filbert Treating Provider/Extender: Skipper Cliche in Treatment: 0 Diagnosis Coding ICD-10 Codes Code Description I89.0 Lymphedema, not elsewhere classified L97.828 Non-pressure chronic ulcer of other part of left lower leg with other specified severity E11.622 Type 2 diabetes mellitus with other skin ulcer I10 Essential (primary) hypertension I50.42 Chronic combined systolic (congestive) and diastolic (congestive) heart failure G47.30 Sleep apnea, unspecified Facility Procedures CPT4: Description Modifier Quantity Code 83382505 99214 - WOUND CARE VISIT-LEV 4 EST PT 1 CPT4: 39767341 97597 - DEBRIDE WOUND 1ST 20 SQ CM OR < 1 CPT4: ICD-10 Diagnosis Description L97.828 Non-pressure chronic ulcer of other part of left lower leg with other specified severity CPT4: 93790240 97598 - DEBRIDE WOUND EA ADDL 20 SQ CM 1 CPT4: ICD-10 Diagnosis Description L97.828 Non-pressure chronic ulcer of other part of left lower leg with other specified severity CPT4: 97353299 24268 BILATERAL: Application of multi-layer venous compression system; leg (below knee), including 1 ankle and foot. Physician Procedures CPT4 Code: 3419622 Description: 29798 - WC PHYS LEVEL 4 -  NEW PT Modifier: 25 Quantity: 1 CPT4 Code: Description: ICD-10 Diagnosis Description I89.0 Lymphedema, not elsewhere classified L97.828 Non-pressure chronic ulcer of other part of left lower leg with other specif E11.622 Type 2 diabetes mellitus with other skin ulcer I10 Essential (primary)  hypertension Modifier: ied severity Quantity: CPT4 Code:  7169678 Description: 93810 - WC PHYS DEBR WO ANESTH 20 SQ CM Modifier: Quantity: 1 CPT4 Code: Description: ICD-10 Diagnosis Description L97.828 Non-pressure chronic ulcer of other part of left lower leg with other specif Modifier: ied severity Quantity: CPT4 Code: 1751025 Description: 85277 - WC PHYS DEBR WO ANESTH EA ADD 20 CM Modifier: Quantity: 1 CPT4 Code: Description: ICD-10 Diagnosis Description L97.828 Non-pressure chronic ulcer of other part of left lower leg with other specif Modifier: ied severity Quantity: Electronic Signature(s) Signed: 08/15/2020 6:00:02 PM By: Worthy Keeler PA-C Previous Signature: 08/15/2020 12:35:51 PM Version By: Georges Mouse, Minus Breeding RN Entered By: Worthy Keeler on 08/15/2020 18:00:02

## 2020-08-21 NOTE — Progress Notes (Signed)
Madison Mcbride, Madison Mcbride ([id number]) Visit Report for 08/15/2020 Abuse/Suicide Risk Screen Details Patient Name: Madison Mcbride Date of Service: 08/15/2020 8:30 AM Medical Record Number: [id number] Patient Account Number: [id number] Date of Birth/Sex: May 09, 1956 (64 y.o. F) Treating RN: Cornell Barman Primary Care Aicia Babinski: Emily Filbert Other Clinician: Referring Clete Kuch: Emily Filbert Treating Deontre Allsup/Extender: Skipper Cliche in Treatment: 0 Abuse/Suicide Risk Screen Items Answer ABUSE RISK SCREEN: Has anyone close to you tried to hurt or harm you recentlyo No Do you feel uncomfortable with anyone in your familyo No Has anyone forced you do things that you didnot want to doo No Electronic Signature(s) Signed: 08/20/2020 9:03:26 PM By: Gretta Cool, BSN, RN, CWS, Kim RN, BSN Entered By: Gretta Cool, BSN, RN, CWS, Kim on 08/15/2020 08:56:51 Madison Mcbride ([id number]) -------------------------------------------------------------------------------- Activities of Daily Living Details Patient Name: Madison Mcbride Date of Service: 08/15/2020 8:30 AM Medical Record Number: [id number] Patient Account Number: [id number] Date of Birth/Sex: Jul 28, 1955 (64 y.o. F) Treating RN: Cornell Barman Primary Care Mieke Brinley: Emily Filbert Other Clinician: Referring Leontina Skidmore: Emily Filbert Treating Abby Tucholski/Extender: Skipper Cliche in Treatment: 0 Activities of Daily Living Items Answer Activities of Daily Living (Please select one for each item) Drive Automobile Completely Able Take Medications Completely Able Use Telephone Completely Able Care for Appearance Completely Able Use Toilet Completely Able Bath / Shower Need Assistance Dress Self Completely Able Feed Self Completely Able Walk Completely Able Get In / Out Bed Completely Able Housework Completely Able Prepare Meals Completely Blossom for Self Completely Able Electronic Signature(s) Signed: 08/20/2020 9:03:26 PM By: Gretta Cool,  BSN, RN, CWS, Kim RN, BSN Entered By: Gretta Cool, BSN, RN, CWS, Kim on 08/15/2020 08:57:13 Madison Mcbride, Madison Mcbride ([id number]) -------------------------------------------------------------------------------- Education Screening Details Patient Name: Madison Mcbride Date of Service: 08/15/2020 8:30 AM Medical Record Number: [id number] Patient Account Number: [id number] Date of Birth/Sex: August 05, 1955 (64 y.o. F) Treating RN: Cornell Barman Primary Care Jaclyn Andy: Emily Filbert Other Clinician: Referring Rex Magee: Emily Filbert Treating Iley Breeden/Extender: Skipper Cliche in Treatment: 0 Learning Preferences/Education Level/Primary Language Highest Education Level: College or Above Preferred Language: English Cognitive Barrier Language Barrier: No Translator Needed: No Memory Deficit: No Emotional Barrier: No Physical Barrier Impaired Vision: No Impaired Hearing: No Decreased Hand dexterity: No Knowledge/Comprehension Knowledge Level: High Comprehension Level: High Ability to understand written instructions: High Ability to understand verbal instructions: High Motivation Anxiety Level: Calm Cooperation: Cooperative Education Importance: Acknowledges Need Interest in Health Problems: Asks Questions Perception: Coherent Willingness to Engage in Self-Management High Activities: Readiness to Engage in Self-Management High Activities: Engineer, maintenance) Signed: 08/20/2020 9:03:26 PM By: Gretta Cool, BSN, RN, CWS, Kim RN, BSN Entered By: Gretta Cool, BSN, RN, CWS, Kim on 08/15/2020 08:57:37 Madison Mcbride, Madison Mcbride ([id number]) -------------------------------------------------------------------------------- Fall Risk Assessment Details Patient Name: Madison Mcbride Date of Service: 08/15/2020 8:30 AM Medical Record Number: [id number] Patient Account Number: [id number] Date of Birth/Sex: May 01, 1956 (64 y.o. F) Treating RN: Cornell Barman Primary Care Melis Trochez: Emily Filbert Other Clinician: Referring Ranbir Chew:  Emily Filbert Treating Machel Violante/Extender: Skipper Cliche in Treatment: 0 Fall Risk Assessment Items Have you had 2 or more falls in the last 12 monthso 0 No Have you had any fall that resulted in injury in the last 12 monthso 0 No FALLS RISK SCREEN History of falling - immediate or within 3 months 0 No Secondary diagnosis (Do you have 2 or more medical diagnoseso) 0 No Ambulatory aid None/bed rest/wheelchair/nurse 0 No Crutches/cane/walker 15 Yes Furniture 0 No Intravenous therapy Access/Saline/Heparin Lock 0 No Gait/Transferring Normal/ bed  rest/ wheelchair 0 Yes Weak (short steps with or without shuffle, stooped but able to lift head while walking, may 0 No seek support from furniture) Impaired (short steps with shuffle, may have difficulty arising from chair, head down, impaired 0 No balance) Mental Status Oriented to own ability 0 Yes Electronic Signature(s) Signed: 08/20/2020 9:03:26 PM By: Gretta Cool, BSN, RN, CWS, Kim RN, BSN Entered By: Gretta Cool, BSN, RN, CWS, Kim on 08/15/2020 08:58:13 Madison Mcbride, Madison Mcbride ([id number]) -------------------------------------------------------------------------------- Foot Assessment Details Patient Name: Madison Mcbride Date of Service: 08/15/2020 8:30 AM Medical Record Number: [id number] Patient Account Number: [id number] Date of Birth/Sex: 1955/07/28 (64 y.o. F) Treating RN: Cornell Barman Primary Care Gunhild Bautch: Emily Filbert Other Clinician: Referring Kohlton Gilpatrick: Emily Filbert Treating Diva Lemberger/Extender: Skipper Cliche in Treatment: 0 Foot Assessment Items Site Locations + = Sensation present, - = Sensation absent, C = Callus, U = Ulcer R = Redness, W = Warmth, M = Maceration, PU = Pre-ulcerative lesion F = Fissure, S = Swelling, D = Dryness Assessment Right: Left: Other Deformity: No No Prior Foot Ulcer: No No Prior Amputation: No No Charcot Joint: No No Ambulatory Status: Ambulatory With Help Assistance Device: Walker Gait:  Administrator, arts) Signed: 08/20/2020 9:03:26 PM By: Gretta Cool, BSN, RN, CWS, Kim RN, BSN Entered By: Gretta Cool, BSN, RN, CWS, Kim on 08/15/2020 09:01:27 Madison Mcbride, Madison Mcbride ([id number]) -------------------------------------------------------------------------------- Nutrition Risk Screening Details Patient Name: Madison Mcbride Date of Service: 08/15/2020 8:30 AM Medical Record Number: [id number] Patient Account Number: [id number] Date of Birth/Sex: [birth year] (64 y.o. F) Treating RN: Cornell Barman Primary Care Fletcher Ostermiller: Emily Filbert Other Clinician: Referring Hal Norrington: Emily Filbert Treating Chevonne Bostrom/Extender: Skipper Cliche in Treatment: 0 Height (in): 65 Weight (lbs): 300 Body Mass Index (BMI): 49.9 Nutrition Risk Screening Items Score Screening NUTRITION RISK SCREEN: I have an illness or condition that made me change the kind and/or amount of food I eat 0 No I eat fewer than two meals per day 0 No I eat few fruits and vegetables, or milk products 0 No I have three or more drinks of beer, liquor or wine almost every day 0 No I have tooth or mouth problems that make it hard for me to eat 0 No I don't always have enough money to buy the food I need 0 No I eat alone most of the time 0 No I take three or more different prescribed or over-the-counter drugs a day 1 Yes Without wanting to, I have lost or gained 10 pounds in the last six months 0 No I am not always physically able to shop, cook and/or feed myself 0 No Nutrition Protocols Good Risk Protocol Provide education on elevated Moderate Risk Protocol 0 blood sugars and impact on wound healing, as applicable High Risk Proctocol Risk Level: Good Risk Score: 1 Electronic Signature(s) Signed: 08/20/2020 9:03:26 PM By: Gretta Cool, BSN, RN, CWS, Kim RN, BSN Entered By: Gretta Cool, BSN, RN, CWS, Kim on 08/15/2020 08:58:39

## 2020-08-21 NOTE — Progress Notes (Signed)
[name], [name] ([id number]) Visit Report for 08/15/2020 Allergy List Details Patient Name: Madison Mcbride, Madison Mcbride Date of Service: 08/15/2020 8:30 AM Medical Record Number: [id number] Patient Account Number: [id number] Date of Birth/Sex: 09/08/1955 (64 y.o. F) Treating RN: Cornell Barman Primary Care Damontay Alred: Emily Filbert Other Clinician: Referring Kemo Spruce: Emily Filbert Treating Hinton Luellen/Extender: Skipper Cliche in Treatment: 0 Allergies Active Allergies No Known Drug Allergies Type: Allergen Allergy Notes Electronic Signature(s) Signed: 08/20/2020 9:03:26 PM By: Gretta Cool, BSN, RN, CWS, Kim RN, BSN Entered By: Gretta Cool, BSN, RN, CWS, Kim on 08/15/2020 08:51:28 TYMESHIA, AWAN ([id number]) -------------------------------------------------------------------------------- Arrival Information Details Patient Name: Madison Mcbride Date of Service: 08/15/2020 8:30 AM Medical Record Number: [id number] Patient Account Number: [id number] Date of Birth/Sex: Dec 10, 1955 (64 y.o. F) Treating RN: Dolan Amen Primary Care Chaye Misch: Emily Filbert Other Clinician: Referring Shayn Madole: Emily Filbert Treating Yenni Carra/Extender: Skipper Cliche in Treatment: 0 Visit Information Patient Arrived: Wheel Chair Arrival Time: 08:34 Accompanied By: husband Transfer Assistance: None Patient Identification Verified: Yes Patient Has Alerts: Yes Patient Alerts: Type II Diabetic Electronic Signature(s) Signed: 08/20/2020 9:03:26 PM By: Gretta Cool, BSN, RN, CWS, Kim RN, BSN Entered By: Gretta Cool, BSN, RN, CWS, Kim on 08/15/2020 08:41:51 Madison Mcbride ([id number]) -------------------------------------------------------------------------------- Clinic Level of Care Assessment Details Patient Name: Madison Mcbride Date of Service: 08/15/2020 8:30 AM Medical Record Number: [id number] Patient Account Number: [id number] Date of Birth/Sex: Dec 12, 1955 (64 y.o. F) Treating RN: Dolan Amen Primary Care Arlette Schaad: Emily Filbert Other  Clinician: Referring Curtis Cain: Emily Filbert Treating Jericca Russett/Extender: Skipper Cliche in Treatment: 0 Clinic Level of Care Assessment Items TOOL 4 Quantity Score X - Use when only an EandM is performed on FOLLOW-UP visit 1 0 ASSESSMENTS - Nursing Assessment / Reassessment X - Reassessment of Co-morbidities (includes updates in patient status) 1 10 X- 1 5 Reassessment of Adherence to Treatment Plan ASSESSMENTS - Wound and Skin Assessment / Reassessment []  - Simple Wound Assessment / Reassessment - one wound 0 X- 2 5 Complex Wound Assessment / Reassessment - multiple wounds []  - 0 Dermatologic / Skin Assessment (not related to wound area) ASSESSMENTS - Focused Assessment X - Circumferential Edema Measurements - multi extremities 1 5 []  - 0 Nutritional Assessment / Counseling / Intervention X- 1 5 Lower Extremity Assessment (monofilament, tuning fork, pulses) []  - 0 Peripheral Arterial Disease Assessment (using hand held doppler) ASSESSMENTS - Ostomy and/or Continence Assessment and Care []  - Incontinence Assessment and Management 0 []  - 0 Ostomy Care Assessment and Management (repouching, etc.) PROCESS - Coordination of Care X - Simple Patient / Family Education for ongoing care 1 15 []  - 0 Complex (extensive) Patient / Family Education for ongoing care []  - 0 Staff obtains Programmer, systems, Records, Test Results / Process Orders []  - 0 Staff telephones HHA, Nursing Homes / Clarify orders / etc []  - 0 Routine Transfer to another Facility (non-emergent condition) []  - 0 Routine Hospital Admission (non-emergent condition) []  - 0 New Admissions / Biomedical engineer / Ordering NPWT, Apligraf, etc. []  - 0 Emergency Hospital Admission (emergent condition) X- 1 10 Simple Discharge Coordination []  - 0 Complex (extensive) Discharge Coordination PROCESS - Special Needs []  - Pediatric / Minor Patient Management 0 []  - 0 Isolation Patient Management []  - 0 Hearing / Language  / Visual special needs []  - 0 Assessment of Community assistance (transportation, D/C planning, etc.) []  - 0 Additional assistance / Altered mentation []  - 0 Support Surface(s) Assessment (bed, cushion, seat, etc.) INTERVENTIONS - Wound Cleansing / Measurement Madison Mcbride, Madison S. (  [id number]) []  - 0 Simple Wound Cleansing - one wound X- 2 5 Complex Wound Cleansing - multiple wounds X- 1 5 Wound Imaging (photographs - any number of wounds) []  - 0 Wound Tracing (instead of photographs) []  - 0 Simple Wound Measurement - one wound X- 2 5 Complex Wound Measurement - multiple wounds INTERVENTIONS - Wound Dressings []  - Small Wound Dressing one or multiple wounds 0 X- 1 15 Medium Wound Dressing one or multiple wounds []  - 0 Large Wound Dressing one or multiple wounds []  - 0 Application of Medications - topical []  - 0 Application of Medications - injection INTERVENTIONS - Miscellaneous []  - External ear exam 0 X- 1 5 Specimen Collection (cultures, biopsies, blood, body fluids, etc.) X- 1 5 Specimen(s) / Culture(s) sent or taken to Lab for analysis []  - 0 Patient Transfer (multiple staff / Civil Service fast streamer / Similar devices) []  - 0 Simple Staple / Suture removal (25 or less) []  - 0 Complex Staple / Suture removal (26 or more) []  - 0 Hypo / Hyperglycemic Management (close monitor of Blood Glucose) X- 1 15 Ankle / Brachial Index (ABI) - do not check if billed separately X- 1 5 Vital Signs Has the patient been seen at the hospital within the last three years: Yes Total Score: 130 Level Of Care: New/Established - Level 4 Electronic Signature(s) Signed: 08/15/2020 12:35:51 PM By: Georges Mouse, Minus Breeding RN Entered By: Georges Mouse, Kenia on 08/15/2020 09:51:39 Madison Mcbride ([id number]) -------------------------------------------------------------------------------- Compression Therapy Details Patient Name: Madison Mcbride Date of Service: 08/15/2020 8:30 AM Medical Record  Number: [id number] Patient Account Number: [id number] Date of Birth/Sex: Nov 29, 1955 (64 y.o. F) Treating RN: Dolan Amen Primary Care Ahava Kissoon: Emily Filbert Other Clinician: Referring Kimley Apsey: Emily Filbert Treating Kaela Beitz/Extender: Jeri Cos Weeks in Treatment: 0 Compression Therapy Performed for Wound Assessment: Wound #1 Left,Medial Lower Leg Performed By: Clinician Dolan Amen, RN Compression Type: Four Layer Pre Treatment ABI: 1.1 Post Procedure Diagnosis Same as Pre-procedure Electronic Signature(s) Signed: 08/15/2020 12:35:51 PM By: Georges Mouse, Minus Breeding RN Entered By: Georges Mouse, Minus Breeding on 08/15/2020 10:02:42 Madison Mcbride ([id number]) -------------------------------------------------------------------------------- Compression Therapy Details Patient Name: Madison Mcbride Date of Service: 08/15/2020 8:30 AM Medical Record Number: [id number] Patient Account Number: [id number] Date of Birth/Sex: [birth year] (64 y.o. F) Treating RN: Dolan Amen Primary Care Jakarius Flamenco: Emily Filbert Other Clinician: Referring Genesee Nase: Emily Filbert Treating Niasha Devins/Extender: Jeri Cos Weeks in Treatment: 0 Compression Therapy Performed for Wound Assessment: NonWound Condition Lymphedema - Right Leg Performed By: Clinician Dolan Amen, RN Compression Type: Four Layer Pre Treatment ABI: 1.3 Post Procedure Diagnosis Same as Pre-procedure Electronic Signature(s) Signed: 08/15/2020 12:35:51 PM By: Georges Mouse, Minus Breeding RN Entered By: Georges Mouse, Minus Breeding on 08/15/2020 10:03:03 Madison Mcbride ([id number]) -------------------------------------------------------------------------------- Lower Extremity Assessment Details Patient Name: Madison Mcbride Date of Service: 08/15/2020 8:30 AM Medical Record Number: [id number] Patient Account Number: [id number] Date of Birth/Sex: 06-17-1956 (64 y.o. F) Treating RN: Cornell Barman Primary Care Aris Even: Emily Filbert Other  Clinician: Referring Essa Wenk: Emily Filbert Treating Vickie Ponds/Extender: Skipper Cliche in Treatment: 0 Edema Assessment Assessed: [Left: No] [Right: No] [Left: Edema] [Right: :] Calf Left: Right: Point of Measurement: 30 cm From Medial Instep 45 cm 46 cm Ankle Left: Right: Point of Measurement: 11 cm From Medial Instep 27.5 cm 27.5 cm Knee To Floor Left: Right: From Medial Instep 38 cm Vascular Assessment Pulses: Dorsalis Pedis Palpable: [Left:Yes] [Right:Yes] Doppler Audible: [Left:Yes] [Right:Yes] Posterior Tibial Palpable: [Left:Yes] [Right:Yes] Doppler Audible: [Left:Yes] [Right:Yes] Blood Pressure: Brachial: [Left:120]  Ankle: [Left:Dorsalis Pedis: 130 1.08] [Right:Dorsalis Pedis: 160 1.33] Electronic Signature(s) Signed: 08/20/2020 9:03:26 PM By: Gretta Cool, BSN, RN, CWS, Kim RN, BSN Entered By: Gretta Cool, BSN, RN, CWS, Kim on 08/15/2020 09:13:18 Madison Mcbride, Madison Mcbride ([id number]) -------------------------------------------------------------------------------- Multi Wound Chart Details Patient Name: Madison Mcbride Date of Service: 08/15/2020 8:30 AM Medical Record Number: [id number] Patient Account Number: [id number] Date of Birth/Sex: Jun 27, 1956 (64 y.o. F) Treating RN: Dolan Amen Primary Care Shantele Reller: Emily Filbert Other Clinician: Referring Marquesa Rath: Emily Filbert Treating Cedar Roseman/Extender: Skipper Cliche in Treatment: 0 Vital Signs Height(in): 65 Pulse(bpm): 77 Weight(lbs): 300 Blood Pressure(mmHg): 116/62 Body Mass Index(BMI): 50 Temperature(F): 98.3 Respiratory Rate(breaths/min): 18 Photos: [N/A:N/A] Wound Location: Left, Medial Lower Leg N/A N/A Wounding Event: Blister N/A N/A Primary Etiology: Lymphedema N/A N/A Date Acquired: 07/27/2020 N/A N/A Weeks of Treatment: 0 N/A N/A Wound Status: Open N/A N/A Measurements L x W x D (cm) 9x9x0.1 N/A N/A Area (cm) : 63.617 N/A N/A Volume (cm) : 6.362 N/A N/A % Reduction in Area: 0.00% N/A N/A % Reduction  in Volume: 0.00% N/A N/A Classification: Unclassifiable N/A N/A Exudate Amount: Medium N/A N/A Exudate Type: Serous N/A N/A Exudate Color: amber N/A N/A Granulation Amount: None Present (0%) N/A N/A Necrotic Amount: Large (67-100%) N/A N/A Necrotic Tissue: Eschar N/A N/A Exposed Structures: Fascia: No N/A N/A Fat Layer (Subcutaneous Tissue): No Tendon: No Muscle: No Joint: No Bone: No Epithelialization: None N/A N/A Debridement: Debridement - Selective/Open N/A N/A Wound Pre-procedure Verification/Time 09:40 N/A N/A Out Taken: Pain Control: Lidocaine 4% Topical Solution N/A N/A Tissue Debrided: Necrotic/Eschar, Slough N/A N/A Level: Non-Viable Tissue N/A N/A Debridement Area (sq cm): 20.25 N/A N/A Instrument: Curette N/A N/A Bleeding: Minimum N/A N/A Hemostasis Achieved: Pressure N/A N/A Debridement Treatment Procedure was tolerated well N/A N/A Response: Post Debridement 9x9x0.2 N/A N/A Measurements L x W x D (cm) Post Debridement Volume: 12.723 N/A N/A (cm) Madison Mcbride, Madison S. ([id number]) Procedures Performed: Debridement N/A N/A Treatment Notes Electronic Signature(s) Signed: 08/15/2020 12:35:51 PM By: Georges Mouse, Minus Breeding RN Entered By: Georges Mouse, Minus Breeding on 08/15/2020 09:55:38 Madison Mcbride ([id number]) -------------------------------------------------------------------------------- Multi-Disciplinary Care Plan Details Patient Name: Madison Mcbride Date of Service: 08/15/2020 8:30 AM Medical Record Number: [id number] Patient Account Number: [id number] Date of Birth/Sex: [birth year] (64 y.o. F) Treating RN: Dolan Amen Primary Care Dionne Rossa: Emily Filbert Other Clinician: Referring Socorro Ebron: Emily Filbert Treating Monti Jilek/Extender: Skipper Cliche in Treatment: 0 Active Inactive Orientation to the Wound Care Program Nursing Diagnoses: Knowledge deficit related to the wound healing center program Goals: Patient/caregiver will verbalize understanding  of the [place] Program Date Initiated: 08/15/2020 Target Resolution Date: 08/15/2020 Goal Status: Active Interventions: Provide education on orientation to the wound center Notes: Wound/Skin Impairment Nursing Diagnoses: Impaired tissue integrity Goals: Patient/caregiver will verbalize understanding of skin care regimen Date Initiated: 08/15/2020 Target Resolution Date: 08/15/2020 Goal Status: Active Ulcer/skin breakdown will have a volume reduction of 30% by week 4 Date Initiated: 08/15/2020 Target Resolution Date: 09/12/2020 Goal Status: Active Ulcer/skin breakdown will have a volume reduction of 50% by week 8 Date Initiated: 08/15/2020 Target Resolution Date: 10/13/2020 Goal Status: Active Interventions: Assess patient/caregiver ability to obtain necessary supplies Assess patient/caregiver ability to perform ulcer/skin care regimen upon admission and as needed Assess ulceration(s) every visit Provide education on ulcer and skin care Treatment Activities: Skin care regimen initiated : 08/15/2020 Topical wound management initiated : 08/15/2020 Notes: Electronic Signature(s) Signed: 08/15/2020 12:35:51 PM By: Georges Mouse, Minus Breeding RN Entered By: Georges Mouse, Minus Breeding on 08/15/2020 09:55:30  Madison Mcbride, Madison Mcbride ([id number]) -------------------------------------------------------------------------------- Pain Assessment Details Patient Name: Madison Mcbride, Madison Mcbride Date of Service: 08/15/2020 8:30 AM Medical Record Number: [id number] Patient Account Number: [id number] Date of Birth/Sex: [birth year] (64 y.o. F) Treating RN: Dolan Amen Primary Care Alleah Dearman: Emily Filbert Other Clinician: Referring Nakesha Ebrahim: Emily Filbert Treating Johnothan Bascomb/Extender: Skipper Cliche in Treatment: 0 Active Problems Location of Pain Severity and Description of Pain Patient Has Paino Yes Site Locations Rate the pain. Current Pain Level: 4 Pain Management and Medication Current Pain Management: Electronic  Signature(s) Signed: 08/15/2020 9:12:52 AM By: Georges Mouse, Minus Breeding RN Signed: 08/20/2020 9:03:26 PM By: Gretta Cool, BSN, RN, CWS, Kim RN, BSN Entered By: Gretta Cool, BSN, RN, CWS, Kim on 08/15/2020 08:41:57 Madison Mcbride, Madison Mcbride ([id number]) -------------------------------------------------------------------------------- Patient/Caregiver Education Details Patient Name: Madison Mcbride Date of Service: 08/15/2020 8:30 AM Medical Record Number: [id number] Patient Account Number: [id number] Date of Birth/Gender: [birth year] (64 y.o. F) Treating RN: Dolan Amen Primary Care Physician: Emily Filbert Other Clinician: Referring Physician: Emily Filbert Treating Physician/Extender: Skipper Cliche in Treatment: 0 Education Assessment Education Provided To: Patient Education Topics Provided Welcome To [facility]: Methods: Explain/Verbal Responses: State content correctly Wound/Skin Impairment: Methods: Explain/Verbal Responses: State content correctly Electronic Signature(s) Signed: 08/15/2020 12:35:51 PM By: Georges Mouse, Minus Breeding RN Entered By: Georges Mouse, Minus Breeding on 08/15/2020 09:57:51 Madison Mcbride ([id number]) -------------------------------------------------------------------------------- Wound Assessment Details Patient Name: Madison Mcbride Date of Service: 08/15/2020 8:30 AM Medical Record Number: [id number] Patient Account Number: [id number] Date of Birth/Sex: [birth year] (64 y.o. F) Treating RN: Cornell Barman Primary Care Marcelino Campos: Emily Filbert Other Clinician: Referring Rodrigues Urbanek: Emily Filbert Treating Giuseppe Duchemin/Extender: Skipper Cliche in Treatment: 0 Wound Status Wound Number: 1 Primary Etiology: Lymphedema Wound Location: Left, Medial Lower Leg Wound Status: Open Wounding Event: Blister Date Acquired: 07/27/2020 Weeks Of Treatment: 0 Clustered Wound: No Photos Wound Measurements Length: (cm) 9 Width: (cm) 9 Depth: (cm) 0.1 Area: (cm) 63.617 Volume: (cm)  6.362 % Reduction in Area: 0% % Reduction in Volume: 0% Epithelialization: None Wound Description Classification: Unclassifiable Exudate Amount: Medium Exudate Type: Serous Exudate Color: amber Foul Odor After Cleansing: No Slough/Fibrino Yes Wound Bed Granulation Amount: None Present (0%) Exposed Structure Necrotic Amount: Large (67-100%) Fascia Exposed: No Necrotic Quality: Eschar Fat Layer (Subcutaneous Tissue) Exposed: No Tendon Exposed: No Muscle Exposed: No Joint Exposed: No Bone Exposed: No Electronic Signature(s) Signed: 08/20/2020 9:03:26 PM By: Gretta Cool, BSN, RN, CWS, Kim RN, BSN Entered By: Gretta Cool, BSN, RN, CWS, Kim on 08/15/2020 08:49:00 Madison Mcbride ([id number]) -------------------------------------------------------------------------------- [place] Details Patient Name: Madison Mcbride Date of Service: 08/15/2020 8:30 AM Medical Record Number: [id number] Patient Account Number: [id number] Date of Birth/Sex: 12/10/1955 (64 y.o. F) Treating RN: Dolan Amen Primary Care Casanova Schurman: Emily Filbert Other Clinician: Referring Elexa Kivi: Emily Filbert Treating Caelen Reierson/Extender: Skipper Cliche in Treatment: 0 Vital Signs Time Taken: 08:35 Temperature (F): 98.3 Height (in): 65 Pulse (bpm): 77 Source: Stated Respiratory Rate (breaths/min): 18 Weight (lbs): 300 Blood Pressure (mmHg): 116/62 Source: Stated Reference Range: 80 - 120 mg / dl Body Mass Index (BMI): 49.9 Electronic Signature(s) Signed: 08/20/2020 9:03:26 PM By: Gretta Cool, BSN, RN, CWS, Kim RN, BSN Entered By: Gretta Cool, BSN, RN, CWS, Kim on 08/15/2020 08:42:02

## 2020-08-22 ENCOUNTER — Encounter: Payer: Medicare Other | Admitting: Physician Assistant

## 2020-08-22 ENCOUNTER — Other Ambulatory Visit: Payer: Self-pay

## 2020-08-22 DIAGNOSIS — E11622 Type 2 diabetes mellitus with other skin ulcer: Secondary | ICD-10-CM | POA: Diagnosis not present

## 2020-08-22 NOTE — Progress Notes (Addendum)
Madison Mcbride, Madison Mcbride ([id number]) Visit Report for 08/22/2020 Chief Complaint Document Details Patient Name: Madison Mcbride Date of Service: 08/22/2020 9:30 AM Medical Record Number: [id number] Patient Account Number: [id number] Date of Birth/Sex: Nov 21, 1955 (64 y.o. F) Treating RN: Dolan Amen Primary Care Provider: Emily Filbert Other Clinician: Referring Provider: Emily Filbert Treating Provider/Extender: Skipper Cliche in Treatment: 1 Information Obtained from: Patient Chief Complaint Bilateral Lymphedema with Left LE Ulcers Electronic Signature(s) Signed: 08/22/2020 9:42:19 AM By: Worthy Keeler PA-C Entered By: Worthy Keeler on 08/22/2020 09:42:19 Madison Mcbride, Madison Mcbride ([id number]) -------------------------------------------------------------------------------- HPI Details Patient Name: Madison Mcbride Date of Service: 08/22/2020 9:30 AM Medical Record Number: [id number] Patient Account Number: [id number] Date of Birth/Sex: 1955/10/07 (64 y.o. F) Treating RN: Dolan Amen Primary Care Provider: Emily Filbert Other Clinician: Referring Provider: Emily Filbert Treating Provider/Extender: Skipper Cliche in Treatment: 1 History of Present Illness HPI Description: 08/15/2020 upon evaluation today patient presents today for bilateral lower extremity lymphedema. She actually 1 month ago began being seen by Dr. Sabra Heck who did wrap her for about 2 weeks. Then they stopped wrapping this. The patient states that following that time she had blisters that arose left greater than right lower extremity. She notes that she also cannot wear compression stockings right now she does have them but really was not wearing them prior to all the blistering beginning either. She does have [facility] coming out for physical therapy although she has not really been seen by them for the wound itself based on what she tells me today. Her most recent hemoglobin A1c was 5.7. She is a type II diabetic and is on  medication for this. She has finished the Keflex that she was previously given by Dr. Sabra Heck. She does using a walker for ambulation. With that being said she tells me that she can get up and move around with a walker decently well which is great news. The patient does have other than the diagnosis of lymphedema also diabetes mellitus type 2, hypertension, congestive heart failure, and sleep apnea. She tells me that she typically sleeps in her recliner due to the fact that it is easier to get up and down when she has to go to the restroom especially in the middle the night when she is somewhat alone. This also unfortunately means that she is often sleeping with her feet on the ground which is the worst position to be and to be honest. I discussed all this with her among other items. She is never been told about lymphedema pumps also think that could be beneficial for her based on what I am seeing currently. 08/22/2020 upon evaluation today patient appears to be doing well with regard to her legs. She actually healed up a lot faster than I even thought she would. With that being said I do think that she is getting need ongoing compression and as I discussed today she does not have that yet as I thought that we would have another week at least order that. Nonetheless I am pleased that she is closed and everything appears to be doing so well this is excellent news. Electronic Signature(s) Signed: 08/22/2020 3:25:01 PM By: Worthy Keeler PA-C Entered By: Worthy Keeler on 08/22/2020 15:25:00 Madison Mcbride, Madison Mcbride ([id number]) -------------------------------------------------------------------------------- Physical Exam Details Patient Name: Madison Mcbride Date of Service: 08/22/2020 9:30 AM Medical Record Number: [id number] Patient Account Number: [id number] Date of Birth/Sex: 1956/05/23 (64 y.o. F) Treating RN: Dolan Amen Primary Care Provider: Emily Filbert Other  Clinician: Referring Provider:  Emily Filbert Treating Provider/Extender: Skipper Cliche in Treatment: 1 Constitutional Well-nourished and well-hydrated in no acute distress. Respiratory normal breathing without difficulty. Psychiatric this patient is able to make decisions and demonstrates good insight into disease process. Alert and Oriented x 3. pleasant and cooperative. Notes Patient's wound bed actually showed signs of good granulation epithelization. I do not see any evidence of active infection and overall I am extremely pleased with where things stand today. In fact I think everything is completely closed on her legs. Her swelling is also significantly down. I definitely think we can obtain measurements today and then we will see how things progress over the next week but I am hopeful we can get her the Velcro wraps and then subsequently get her into those by next week so that she can keep things under control at home. Electronic Signature(s) Signed: 08/22/2020 3:25:36 PM By: Worthy Keeler PA-C Entered By: Worthy Keeler on 08/22/2020 15:25:36 Madison Mcbride, Madison Mcbride ([id number]) -------------------------------------------------------------------------------- Physician Orders Details Patient Name: Madison Mcbride Date of Service: 08/22/2020 9:30 AM Medical Record Number: [id number] Patient Account Number: [id number] Date of Birth/Sex: 1956-03-18 (64 y.o. F) Treating RN: Dolan Amen Primary Care Provider: Emily Filbert Other Clinician: Referring Provider: Emily Filbert Treating Provider/Extender: Skipper Cliche in Treatment: 1 Verbal / Phone Orders: No Diagnosis Coding ICD-10 Coding Code Description I89.0 Lymphedema, not elsewhere classified L97.828 Non-pressure chronic ulcer of other part of left lower leg with other specified severity E11.622 Type 2 diabetes mellitus with other skin ulcer I10 Essential (primary) hypertension I50.42 Chronic combined systolic (congestive) and diastolic (congestive) heart  failure G47.30 Sleep apnea, unspecified Follow-up Appointments o Return Appointment in 1 week. o Nurse Visit as needed Edema Control - Lymphedema / Segmental Compressive Device / Other Bilateral Lower Extremities o Optional: One layer of unna paste to top of compression wrap (to act as an anchor). o 4 Layer Compression System (Left, Right, Bilateral) Lymphedema. o Elevate, Exercise Daily and Avoid Standing for Long Periods of Time. o Elevate legs to the level of the heart and pump ankles as often as possible o Elevate leg(s) parallel to the floor when sitting. o Other: - TCA ointment on legs Electronic Signature(s) Signed: 08/22/2020 12:49:19 PM By: Georges Mouse, Minus Breeding RN Signed: 08/22/2020 4:44:32 PM By: Worthy Keeler PA-C Entered By: Georges Mouse, Minus Breeding on 08/22/2020 10:24:54 Madison Mcbride, Madison Mcbride ([id number]) -------------------------------------------------------------------------------- Problem List Details Patient Name: Madison Mcbride Date of Service: 08/22/2020 9:30 AM Medical Record Number: [id number] Patient Account Number: [id number] Date of Birth/Sex: January 03, 1956 (64 y.o. F) Treating RN: Dolan Amen Primary Care Provider: Emily Filbert Other Clinician: Referring Provider: Emily Filbert Treating Provider/Extender: Skipper Cliche in Treatment: 1 Active Problems ICD-10 Encounter Code Description Active Date MDM Diagnosis I89.0 Lymphedema, not elsewhere classified 08/15/2020 No Yes L97.828 Non-pressure chronic ulcer of other part of left lower leg with other 08/15/2020 No Yes specified severity E11.622 Type 2 diabetes mellitus with other skin ulcer 08/15/2020 No Yes I10 Essential (primary) hypertension 08/15/2020 No Yes I50.42 Chronic combined systolic (congestive) and diastolic (congestive) heart 08/15/2020 No Yes failure G47.30 Sleep apnea, unspecified 08/15/2020 No Yes Inactive Problems Resolved Problems Electronic Signature(s) Signed: 08/22/2020 9:42:14  AM By: Worthy Keeler PA-C Entered By: Worthy Keeler on 08/22/2020 09:42:14 Madison Mcbride ([id number]) -------------------------------------------------------------------------------- Progress Note Details Patient Name: Madison Mcbride Date of Service: 08/22/2020 9:30 AM Medical Record Number: [id number] Patient Account Number: [id number] Date of Birth/Sex: 1956-04-16 (64 y.o. F) Treating RN:  Dolan Amen Primary Care Provider: Emily Filbert Other Clinician: Referring Provider: Emily Filbert Treating Provider/Extender: Skipper Cliche in Treatment: 1 Subjective Chief Complaint Information obtained from Patient Bilateral Lymphedema with Left LE Ulcers History of Present Illness (HPI) 08/15/2020 upon evaluation today patient presents today for bilateral lower extremity lymphedema. She actually 1 month ago began being seen by Dr. Sabra Heck who did wrap her for about 2 weeks. Then they stopped wrapping this. The patient states that following that time she had blisters that arose left greater than right lower extremity. She notes that she also cannot wear compression stockings right now she does have them but really was not wearing them prior to all the blistering beginning either. She does have [facility] coming out for physical therapy although she has not really been seen by them for the wound itself based on what she tells me today. Her most recent hemoglobin A1c was 5.7. She is a type II diabetic and is on medication for this. She has finished the Keflex that she was previously given by Dr. Sabra Heck. She does using a walker for ambulation. With that being said she tells me that she can get up and move around with a walker decently well which is great news. The patient does have other than the diagnosis of lymphedema also diabetes mellitus type 2, hypertension, congestive heart failure, and sleep apnea. She tells me that she typically sleeps in her recliner due to the fact that it is easier  to get up and down when she has to go to the restroom especially in the middle the night when she is somewhat alone. This also unfortunately means that she is often sleeping with her feet on the ground which is the worst position to be and to be honest. I discussed all this with her among other items. She is never been told about lymphedema pumps also think that could be beneficial for her based on what I am seeing currently. 08/22/2020 upon evaluation today patient appears to be doing well with regard to her legs. She actually healed up a lot faster than I even thought she would. With that being said I do think that she is getting need ongoing compression and as I discussed today she does not have that yet as I thought that we would have another week at least order that. Nonetheless I am pleased that she is closed and everything appears to be doing so well this is excellent news. Objective Constitutional Well-nourished and well-hydrated in no acute distress. Vitals Time Taken: 10:06 AM, Height: 65 in, Weight: 300 lbs, BMI: 49.9, Temperature: 98.3 F, Pulse: 74 bpm, Respiratory Rate: 20 breaths/min, Blood Pressure: 139/78 mmHg. Respiratory normal breathing without difficulty. Psychiatric this patient is able to make decisions and demonstrates good insight into disease process. Alert and Oriented x 3. pleasant and cooperative. General Notes: Patient's wound bed actually showed signs of good granulation epithelization. I do not see any evidence of active infection and overall I am extremely pleased with where things stand today. In fact I think everything is completely closed on her legs. Her swelling is also significantly down. I definitely think we can obtain measurements today and then we will see how things progress over the next week but I am hopeful we can get her the Velcro wraps and then subsequently get her into those by next week so that she can keep things under control  at home. Integumentary (Hair, Skin) Wound #1 status is Open. Original cause of wound was  Blister. The wound is located on the Left,Medial Lower Leg. The wound measures 0cm length x 0cm width x 0cm depth; 0cm^2 area and 0cm^3 volume. There is no tunneling or undermining noted. There is a none present amount of drainage noted. There is no granulation within the wound bed. There is no necrotic tissue within the wound bed. Madison Mcbride, Madison Mcbride ([id number]) Assessment Active Problems ICD-10 Lymphedema, not elsewhere classified Non-pressure chronic ulcer of other part of left lower leg with other specified severity Type 2 diabetes mellitus with other skin ulcer Essential (primary) hypertension Chronic combined systolic (congestive) and diastolic (congestive) heart failure Sleep apnea, unspecified Procedures There was a Four Layer Compression Therapy Procedure by Dolan Amen, RN. Post procedure Diagnosis Wound #: Same as Pre-Procedure Notes: pt tolerating wraps well. Plan Follow-up Appointments: Return Appointment in 1 week. Nurse Visit as needed Edema Control - Lymphedema / Segmental Compressive Device / Other: Optional: One layer of unna paste to top of compression wrap (to act as an anchor). 4 Layer Compression System (Left, Right, Bilateral) Lymphedema. Elevate, Exercise Daily and Avoid Standing for Long Periods of Time. Elevate legs to the level of the heart and pump ankles as often as possible Elevate leg(s) parallel to the floor when sitting. Other: - TCA ointment on legs 1. Would recommend currently that we continue with wound care measures as before and the patient is in agreement the plan. This includes the use of the 4-layer compression wrap. That did a great job for her fortunately we do not need any specific wound care dressings at this point. 2. I am good to go ahead and recommend that we go ahead and have the patient utilize triamcinolone liberally over the legs to try to help  with taking care of some of the itching and irritation. 3. We did order for wrap 4000 compression for her today. We will see patient back for reevaluation in 1 week here in the clinic. If anything worsens or changes patient will contact our office for additional recommendations. I think she will be get ready for discharge by this time. Electronic Signature(s) Signed: 08/22/2020 3:26:53 PM By: Worthy Keeler PA-C Entered By: Worthy Keeler on 08/22/2020 15:26:52 Madison Mcbride, Madison Mcbride ([id number]) -------------------------------------------------------------------------------- SuperBill Details Patient Name: Madison Mcbride Date of Service: 08/22/2020 Medical Record Number: [id number] Patient Account Number: [id number] Date of Birth/Sex: May 12, 1956 (64 y.o. F) Treating RN: Dolan Amen Primary Care Provider: Emily Filbert Other Clinician: Referring Provider: Emily Filbert Treating Provider/Extender: Skipper Cliche in Treatment: 1 Diagnosis Coding ICD-10 Codes Code Description I89.0 Lymphedema, not elsewhere classified L97.828 Non-pressure chronic ulcer of other part of left lower leg with other specified severity E11.622 Type 2 diabetes mellitus with other skin ulcer I10 Essential (primary) hypertension I50.42 Chronic combined systolic (congestive) and diastolic (congestive) heart failure G47.30 Sleep apnea, unspecified Facility Procedures CPT4: Description Modifier Quantity Code [id number] BILATERAL: Application of multi-layer venous compression system; leg (below knee), including 1 ankle and foot. Physician Procedures CPT4 Code: [id number] Description: 94765 - WC PHYS LEVEL 3 - EST PT Modifier: Quantity: 1 CPT4 Code: Description: ICD-10 Diagnosis Description I89.0 Lymphedema, not elsewhere classified E11.622 Type 2 diabetes mellitus with other skin ulcer L97.828 Non-pressure chronic ulcer of other part of left lower leg with other spe I10 Essential (primary)   hypertension Modifier: cified severity Quantity: Electronic Signature(s) Signed: 08/22/2020 3:27:11 PM By: Worthy Keeler PA-C Previous Signature: 08/22/2020 12:49:19 PM Version By: Georges Mouse, Minus Breeding RN Entered By: Worthy Keeler on 08/22/2020 15:27:11

## 2020-08-22 NOTE — Progress Notes (Addendum)
BRAYLINN, GULDEN ([id number]) Visit Report for 08/22/2020 Arrival Information Details Patient Name: Madison Mcbride, Madison Mcbride Date of Service: 08/22/2020 9:30 AM Medical Record Number: [id number] Patient Account Number: [id number] Date of Birth/Sex: 12-May-1956 (64 y.o. F) Treating RN: Carlene Coria Primary Care Albino Bufford: Emily Filbert Other Clinician: Referring Brealynn Contino: Emily Filbert Treating Zakariye Nee/Extender: Skipper Cliche in Treatment: 1 Visit Information History Since Last Visit All ordered tests and consults were completed: No Patient Arrived: Wheel Chair Added or deleted any medications: No Arrival Time: 09:54 Any new allergies or adverse reactions: No Accompanied By: husband Had a fall or experienced change in No Transfer Assistance: None activities of daily living that may affect Patient Identification Verified: Yes risk of falls: Secondary Verification Process Completed: Yes Signs or symptoms of abuse/neglect since last visito No Patient Has Alerts: Yes Hospitalized since last visit: No Patient Alerts: Type II Diabetic Implantable device outside of the clinic excluding No cellular tissue based products placed in the center since last visit: Has Dressing in Place as Prescribed: Yes Has Compression in Place as Prescribed: Yes Pain Present Now: No Electronic Signature(s) Signed: 08/22/2020 5:17:48 PM By: Carlene Coria RN Entered By: Carlene Coria on 08/22/2020 09:57:52 Wunder, Alain Marion ([id number]) -------------------------------------------------------------------------------- Clinic Level of Care Assessment Details Patient Name: Madison Bible Date of Service: 08/22/2020 9:30 AM Medical Record Number: [id number] Patient Account Number: [id number] Date of Birth/Sex: Dec 06, 1955 (64 y.o. F) Treating RN: Dolan Amen Primary Care Yoskar Murrillo: Emily Filbert Other Clinician: Referring Melek Pownall: Emily Filbert Treating Johnathyn Viscomi/Extender: Skipper Cliche in Treatment: 1 Clinic  Level of Care Assessment Items TOOL 1 Quantity Score []  - Use when EandM and Procedure is performed on INITIAL visit 0 ASSESSMENTS - Nursing Assessment / Reassessment []  - General Physical Exam (combine w/ comprehensive assessment (listed just below) when performed on new 0 pt. evals) []  - 0 Comprehensive Assessment (HX, ROS, Risk Assessments, Wounds Hx, etc.) ASSESSMENTS - Wound and Skin Assessment / Reassessment []  - Dermatologic / Skin Assessment (not related to wound area) 0 ASSESSMENTS - Ostomy and/or Continence Assessment and Care []  - Incontinence Assessment and Management 0 []  - 0 Ostomy Care Assessment and Management (repouching, etc.) PROCESS - Coordination of Care []  - Simple Patient / Family Education for ongoing care 0 []  - 0 Complex (extensive) Patient / Family Education for ongoing care []  - 0 Staff obtains Programmer, systems, Records, Test Results / Process Orders []  - 0 Staff telephones HHA, Nursing Homes / Clarify orders / etc []  - 0 Routine Transfer to another Facility (non-emergent condition) []  - 0 Routine Hospital Admission (non-emergent condition) []  - 0 New Admissions / Biomedical engineer / Ordering NPWT, Apligraf, etc. []  - 0 Emergency Hospital Admission (emergent condition) PROCESS - Special Needs []  - Pediatric / Minor Patient Management 0 []  - 0 Isolation Patient Management []  - 0 Hearing / Language / Visual special needs []  - 0 Assessment of Community assistance (transportation, D/C planning, etc.) []  - 0 Additional assistance / Altered mentation []  - 0 Support Surface(s) Assessment (bed, cushion, seat, etc.) INTERVENTIONS - Miscellaneous []  - External ear exam 0 []  - 0 Patient Transfer (multiple staff / Civil Service fast streamer / Similar devices) []  - 0 Simple Staple / Suture removal (25 or less) []  - 0 Complex Staple / Suture removal (26 or more) []  - 0 Hypo/Hyperglycemic Management (do not check if billed separately) []  - 0 Ankle / Brachial Index  (ABI) - do not check if billed separately Has the patient been seen at the hospital within the  last three years: Yes Total Score: 0 Level Of Care: ____ Madison Bible ([id number]) Electronic Signature(s) Signed: 08/22/2020 12:49:19 PM By: Georges Mouse, Minus Breeding RN Entered By: Georges Mouse, Minus Breeding on 08/22/2020 10:35:42 Madison Bible ([id number]) -------------------------------------------------------------------------------- Compression Therapy Details Patient Name: Madison Bible Date of Service: 08/22/2020 9:30 AM Medical Record Number: [id number] Patient Account Number: [id number] Date of Birth/Sex: 1956/05/31 (64 y.o. F) Treating RN: Dolan Amen Primary Care Klover Priestly: Emily Filbert Other Clinician: Referring Faren Florence: Emily Filbert Treating Camerin Ladouceur/Extender: Skipper Cliche in Treatment: 1 Compression Therapy Performed for Wound Assessment: NonWound Condition Lymphedema - Right Leg Performed By: Clinician Dolan Amen, RN Compression Type: Four Layer Post Procedure Diagnosis Same as Pre-procedure Notes pt tolerating wraps well Electronic Signature(s) Signed: 08/22/2020 12:49:19 PM By: Georges Mouse, Minus Breeding RN Entered By: Georges Mouse, Minus Breeding on 08/22/2020 10:19:51 Madison Bible ([id number]) -------------------------------------------------------------------------------- Lower Extremity Assessment Details Patient Name: Madison Bible Date of Service: 08/22/2020 9:30 AM Medical Record Number: [id number] Patient Account Number: [id number] Date of Birth/Sex: 02-May-1956 (64 y.o. F) Treating RN: Carlene Coria Primary Care Jadwiga Faidley: Emily Filbert Other Clinician: Referring Cayetano Mikita: Emily Filbert Treating Gracen Southwell/Extender: Skipper Cliche in Treatment: 1 Edema Assessment Assessed: [Left: No] [Right: No] [Left: Edema] [Right: :] Calf Left: Right: Point of Measurement: 30 cm From Medial Instep 43 cm 47 cm Ankle Left: Right: Point of Measurement: 11 cm  From Medial Instep 24 cm 25 cm Electronic Signature(s) Signed: 08/22/2020 5:17:48 PM By: Carlene Coria RN Entered By: Carlene Coria on 08/22/2020 10:08:04 Madison Bible ([id number]) -------------------------------------------------------------------------------- Multi Wound Chart Details Patient Name: Madison Bible Date of Service: 08/22/2020 9:30 AM Medical Record Number: [id number] Patient Account Number: [id number] Date of Birth/Sex: 05/11/1956 (64 y.o. F) Treating RN: Dolan Amen Primary Care Kelyn Ponciano: Emily Filbert Other Clinician: Referring Ellaree Gear: Emily Filbert Treating Emmaclaire Switala/Extender: Skipper Cliche in Treatment: 1 Vital Signs Height(in): 65 Pulse(bpm): 74 Weight(lbs): 300 Blood Pressure(mmHg): 139/78 Body Mass Index(BMI): 50 Temperature(F): 98.3 Respiratory Rate(breaths/min): 20 Photos: [N/A:N/A] Wound Location: Left, Medial Lower Leg N/A N/A Wounding Event: Blister N/A N/A Primary Etiology: Lymphedema N/A N/A Comorbid History: Sleep Apnea, Hypertension, Type II N/A N/A Diabetes, Osteoarthritis, Neuropathy Date Acquired: 07/27/2020 N/A N/A Weeks of Treatment: 1 N/A N/A Wound Status: Open N/A N/A Measurements L x W x D (cm) 0x0x0 N/A N/A Area (cm) : 0 N/A N/A Volume (cm) : 0 N/A N/A % Reduction in Area: 100.00% N/A N/A % Reduction in Volume: 100.00% N/A N/A Classification: Unclassifiable N/A N/A Exudate Amount: None Present N/A N/A Granulation Amount: None Present (0%) N/A N/A Necrotic Amount: None Present (0%) N/A N/A Exposed Structures: Fascia: No N/A N/A Fat Layer (Subcutaneous Tissue): No Tendon: No Muscle: No Joint: No Bone: No Epithelialization: Large (67-100%) N/A N/A Treatment Notes Electronic Signature(s) Signed: 08/22/2020 12:49:19 PM By: Georges Mouse, Minus Breeding RN Entered By: Georges Mouse, Minus Breeding on 08/22/2020 10:13:41 Madison Bible  ([id number]) -------------------------------------------------------------------------------- [place] Details Patient Name: Madison Bible Date of Service: 08/22/2020 9:30 AM Medical Record Number: [id number] Patient Account Number: [id number] Date of Birth/Sex: 29-Sep-1955 (64 y.o. F) Treating RN: Dolan Amen Primary Care Bharath Bernstein: Emily Filbert Other Clinician: Referring Shauntel Prest: Emily Filbert Treating Laykin Rainone/Extender: Skipper Cliche in Treatment: 1 Active Inactive Wound/Skin Impairment Nursing Diagnoses: Impaired tissue integrity Goals: Patient/caregiver will verbalize understanding of skin care regimen Date Initiated: 08/15/2020 Target Resolution Date: 08/15/2020 Goal Status: Active Ulcer/skin breakdown will have a volume reduction of 30% by week 4 Date Initiated: 08/15/2020 Target Resolution Date: 09/12/2020 Goal Status: Active  Ulcer/skin breakdown will have a volume reduction of 50% by week 8 Date Initiated: 08/15/2020 Target Resolution Date: 10/13/2020 Goal Status: Active Interventions: Assess patient/caregiver ability to obtain necessary supplies Assess patient/caregiver ability to perform ulcer/skin care regimen upon admission and as needed Assess ulceration(s) every visit Provide education on ulcer and skin care Treatment Activities: Skin care regimen initiated : 08/15/2020 Topical wound management initiated : 08/15/2020 Notes: Electronic Signature(s) Signed: 08/22/2020 12:49:19 PM By: Georges Mouse, Minus Breeding RN Entered By: Georges Mouse, Minus Breeding on 08/22/2020 10:13:05 Madison Bible ([id number]) -------------------------------------------------------------------------------- Pain Assessment Details Patient Name: Madison Bible Date of Service: 08/22/2020 9:30 AM Medical Record Number: [id number] Patient Account Number: [id number] Date of Birth/Sex: August 05, 1955 (64 y.o. F) Treating RN: Carlene Coria Primary Care Bert Ptacek: Emily Filbert Other  Clinician: Referring Cranford Blessinger: Emily Filbert Treating Lillee Mooneyhan/Extender: Skipper Cliche in Treatment: 1 Active Problems Location of Pain Severity and Description of Pain Patient Has Paino No Site Locations Pain Management and Medication Current Pain Management: Electronic Signature(s) Signed: 08/22/2020 5:17:48 PM By: Carlene Coria RN Entered By: Carlene Coria on 08/22/2020 10:07:05 Madison Bible ([id number]) -------------------------------------------------------------------------------- Patient/Caregiver Education Details Patient Name: Madison Bible Date of Service: 08/22/2020 9:30 AM Medical Record Number: [id number] Patient Account Number: [id number] Date of Birth/Gender: [birth year] (64 y.o. F) Treating RN: Dolan Amen Primary Care Physician: Emily Filbert Other Clinician: Referring Physician: Emily Filbert Treating Physician/Extender: Skipper Cliche in Treatment: 1 Education Assessment Education Provided To: Patient Education Topics Provided Wound/Skin Impairment: Methods: Explain/Verbal Responses: State content correctly Electronic Signature(s) Signed: 08/22/2020 12:49:19 PM By: Georges Mouse, Minus Breeding RN Entered By: Georges Mouse, Minus Breeding on 08/22/2020 10:36:22 JEANETT, ANTONOPOULOS ([id number]) -------------------------------------------------------------------------------- Wound Assessment Details Patient Name: Madison Bible Date of Service: 08/22/2020 9:30 AM Medical Record Number: [id number] Patient Account Number: [id number] Date of Birth/Sex: 01-14-1956 (64 y.o. F) Treating RN: Carlene Coria Primary Care Jalissa Heinzelman: Emily Filbert Other Clinician: Referring Devynne Sturdivant: Emily Filbert Treating Cindi Ghazarian/Extender: Skipper Cliche in Treatment: 1 Wound Status Wound Number: 1 Primary Lymphedema Etiology: Wound Location: Left, Medial Lower Leg Wound Status: Open Wounding Event: Blister Comorbid Sleep Apnea, Hypertension, Type II Diabetes, Date Acquired:  07/27/2020 History: Osteoarthritis, Neuropathy Weeks Of Treatment: 1 Clustered Wound: No Photos Wound Measurements Length: (cm) 0 Width: (cm) 0 Depth: (cm) 0 Area: (cm) Volume: (cm) % Reduction in Area: 100% % Reduction in Volume: 100% Epithelialization: Large (67-100%) 0 Tunneling: No 0 Undermining: No Wound Description Classification: Unclassifiable Exudate Amount: None Present Foul Odor After Cleansing: No Slough/Fibrino No Wound Bed Granulation Amount: None Present (0%) Exposed Structure Necrotic Amount: None Present (0%) Fascia Exposed: No Fat Layer (Subcutaneous Tissue) Exposed: No Tendon Exposed: No Muscle Exposed: No Joint Exposed: No Bone Exposed: No Electronic Signature(s) Signed: 08/22/2020 5:17:48 PM By: Carlene Coria RN Entered By: Carlene Coria on 08/22/2020 10:07:35 Madison Bible ([id number]) -------------------------------------------------------------------------------- [place] Details Patient Name: Madison Bible Date of Service: 08/22/2020 9:30 AM Medical Record Number: [id number] Patient Account Number: [id number] Date of Birth/Sex: [birth year] (64 y.o. F) Treating RN: Carlene Coria Primary Care Harlem Thresher: Emily Filbert Other Clinician: Referring Wilmarie Sparlin: Emily Filbert Treating Catherine Oak/Extender: Skipper Cliche in Treatment: 1 Vital Signs Time Taken: 10:06 Temperature (F): 98.3 Height (in): 65 Pulse (bpm): 74 Weight (lbs): 300 Respiratory Rate (breaths/min): 20 Body Mass Index (BMI): 49.9 Blood Pressure (mmHg): 139/78 Reference Range: 80 - 120 mg / dl Electronic Signature(s) Signed: 08/22/2020 5:17:48 PM By: Carlene Coria RN Entered By: Carlene Coria on 08/22/2020 10:06:59

## 2020-08-29 ENCOUNTER — Encounter: Payer: Medicare Other | Admitting: Physician Assistant

## 2020-08-29 ENCOUNTER — Other Ambulatory Visit: Payer: Self-pay

## 2020-08-29 DIAGNOSIS — E11622 Type 2 diabetes mellitus with other skin ulcer: Secondary | ICD-10-CM | POA: Diagnosis not present

## 2020-08-29 NOTE — Progress Notes (Signed)
[name], [name] ([id number]) Visit Report for 08/29/2020 Chief Complaint Document Details Patient Name: Madison Mcbride, Madison Mcbride Date of Service: 08/29/2020 8:15 AM Medical Record Number: [id number] Patient Account Number: [id number] Date of Birth/Sex: August 18, 1955 (65 y.o. F) Treating RN: Madison Mcbride Primary Care Provider: Emily Mcbride Other Clinician: Referring Provider: Emily Mcbride Treating Provider/Extender: Madison Mcbride in Treatment: 2 Information Obtained from: Patient Chief Complaint Bilateral Lymphedema with Left LE Ulcers Electronic Signature(s) Signed: 08/29/2020 10:12:21 AM By: Worthy Keeler PA-C Entered By: Worthy Mcbride on 08/29/2020 10:12:21 OVELLA, MANYGOATS ([id number]) -------------------------------------------------------------------------------- HPI Details Patient Name: Madison Mcbride Date of Service: 08/29/2020 8:15 AM Medical Record Number: [id number] Patient Account Number: [id number] Date of Birth/Sex: 1956/06/29 (65 y.o. F) Treating RN: Madison Mcbride Primary Care Provider: Emily Mcbride Other Clinician: Referring Provider: Emily Mcbride Treating Provider/Extender: Madison Mcbride in Treatment: 2 History of Present Illness HPI Description: 08/15/2020 upon evaluation today patient presents today for bilateral lower extremity lymphedema. She actually 1 month ago began being seen by Dr. Sabra Heck who did wrap her for about 2 weeks. Then they stopped wrapping this. The patient states that following that time she had blisters that arose left greater than right lower extremity. She notes that she also cannot wear compression stockings right now she does have them but really was not wearing them prior to all the blistering beginning either. She does have [facility] coming out for physical therapy although she has not really been seen by them for the wound itself based on what she tells me today. Her most recent hemoglobin A1c was 5.7. She is a type II diabetic and is on  medication for this. She has finished the Keflex that she was previously given by Dr. Sabra Heck. She does using a walker for ambulation. With that being said she tells me that she can get up and move around with a walker decently well which is great news. The patient does have other than the diagnosis of lymphedema also diabetes mellitus type 2, hypertension, congestive heart failure, and sleep apnea. She tells me that she typically sleeps in her recliner due to the fact that it is easier to get up and down when she has to go to the restroom especially in the middle the night when she is somewhat alone. This also unfortunately means that she is often sleeping with her feet on the ground which is the worst position to be and to be honest. I discussed all this with her among other items. She is never been told about lymphedema pumps also think that could be beneficial for her based on what I am seeing currently. 08/22/2020 upon evaluation today patient appears to be doing well with regard to her legs. She actually healed up a lot faster than I even thought she would. With that being said I do think that she is getting need ongoing compression and as I discussed today she does not have that yet as I thought that we would have another week at least order that. Nonetheless I am pleased that she is closed and everything appears to be doing so well this is excellent news. 08/29/2020 upon evaluation today patient appears to be doing excellent in regard to her legs. In fact there does not appear to be anything open at this point which is excellent news. I think she has done extremely well and the main issue is that she needs to have compression at home. Her [place] wraps that we ordered on back order that is  going to take a little bit longer to get them to her. Nonetheless I did discuss with her the possibility of Tubigrip in the interim where again are sure how to use the [place] wraps today. Electronic  Signature(s) Signed: 08/29/2020 10:25:46 AM By: Worthy Keeler PA-C Entered By: Worthy Mcbride on 08/29/2020 10:25:46 [name], [name] ([id number]) -------------------------------------------------------------------------------- Physical Exam Details Patient Name: Madison Mcbride Date of Service: 08/29/2020 8:15 AM Medical Record Number: [id number] Patient Account Number: [id number] Date of Birth/Sex: [birth year] (65 y.o. F) Treating RN: Madison Mcbride Primary Care Provider: Emily Mcbride Other Clinician: Referring Provider: Emily Mcbride Treating Provider/Extender: Madison Mcbride in Treatment: 2 Constitutional Well-nourished and well-hydrated in no acute distress. Respiratory normal breathing without difficulty. Psychiatric this patient is able to make decisions and demonstrates good insight into disease process. Alert and Oriented x 3. pleasant and cooperative. Notes Upon inspection patient's wound bed actually showed signs of good epithelization. There does not appear to be any evidence of active infection which is great news and overall I am extremely pleased with where things stand today. Overall I think that she is ready for discharge and does not need any additional follow-up to be honest. Electronic Signature(s) Signed: 08/29/2020 10:26:12 AM By: Worthy Keeler PA-C Entered By: Worthy Mcbride on 08/29/2020 10:26:12 [name], [name] ([id number]) -------------------------------------------------------------------------------- Physician Orders Details Patient Name: Madison Mcbride Date of Service: 08/29/2020 8:15 AM Medical Record Number: [id number] Patient Account Number: [id number] Date of Birth/Sex: [birth year] (65 y.o. F) Treating RN: Madison Mcbride Primary Care Provider: Emily Mcbride Other Clinician: Referring Provider: Emily Mcbride Treating Provider/Extender: Madison Mcbride in Treatment: 2 Verbal / Phone Orders: No Diagnosis Coding Discharge From [facility]  Services o Discharge from [facility] o Wear compression garments daily. Put garments on first thing when you wake up and remove them before bed. o Moisturize legs daily after removing compression garments. Edema Control - Lymphedema / Segmental Compressive Device / Other Bilateral Lower Extremities o Tubigrip double layer applied o Elevate, Exercise Daily and Avoid Standing for Long Periods of Time. o Elevate legs to the level of the heart and pump ankles as often as possible o Elevate leg(s) parallel to the floor when sitting. Electronic Signature(s) Signed: 08/29/2020 12:41:55 PM By: Georges Mouse, Minus Breeding RN Signed: 08/29/2020 4:58:35 PM By: Worthy Keeler PA-C Entered By: Georges Mouse, Minus Breeding on 08/29/2020 08:44:51 REINA, WILTON ([id number]) -------------------------------------------------------------------------------- Problem List Details Patient Name: Madison Mcbride Date of Service: 08/29/2020 8:15 AM Medical Record Number: [id number] Patient Account Number: [id number] Date of Birth/Sex: [birth year] (64 y.o. F) Treating RN: Madison Mcbride Primary Care Provider: Emily Mcbride Other Clinician: Referring Provider: Emily Mcbride Treating Provider/Extender: Madison Mcbride in Treatment: 2 Active Problems ICD-10 Encounter Code Description Active Date MDM Diagnosis I89.0 Lymphedema, not elsewhere classified 08/15/2020 No Yes L97.828 Non-pressure chronic ulcer of other part of left lower leg with other 08/15/2020 No Yes specified severity E11.622 Type 2 diabetes mellitus with other skin ulcer 08/15/2020 No Yes I10 Essential (primary) hypertension 08/15/2020 No Yes I50.42 Chronic combined systolic (congestive) and diastolic (congestive) heart 08/15/2020 No Yes failure G47.30 Sleep apnea, unspecified 08/15/2020 No Yes Inactive Problems Resolved Problems Electronic Signature(s) Signed: 08/29/2020 10:12:16 AM By: Worthy Keeler PA-C Entered By:  Worthy Mcbride on 08/29/2020 10:12:15 Madison Mcbride ([id number]) -------------------------------------------------------------------------------- Progress Note Details Patient Name: Madison Mcbride Date of Service: 08/29/2020 8:15 AM Medical Record Number: [id number] Patient Account Number: [id number] Date of Birth/Sex: 03-28-1956 (64  y.o. F) Treating RN: Madison Mcbride Primary Care Provider: Emily Mcbride Other Clinician: Referring Provider: Emily Mcbride Treating Provider/Extender: Madison Mcbride in Treatment: 2 Subjective Chief Complaint Information obtained from Patient Bilateral Lymphedema with Left LE Ulcers History of Present Illness (HPI) 08/15/2020 upon evaluation today patient presents today for bilateral lower extremity lymphedema. She actually 1 month ago began being seen by Dr. Sabra Heck who did wrap her for about 2 weeks. Then they stopped wrapping this. The patient states that following that time she had blisters that arose left greater than right lower extremity. She notes that she also cannot wear compression stockings right now she does have them but really was not wearing them prior to all the blistering beginning either. She does have [facility] coming out for physical therapy although she has not really been seen by them for the wound itself based on what she tells me today. Her most recent hemoglobin A1c was 5.7. She is a type II diabetic and is on medication for this. She has finished the Keflex that she was previously given by Dr. Sabra Heck. She does using a walker for ambulation. With that being said she tells me that she can get up and move around with a walker decently well which is great news. The patient does have other than the diagnosis of lymphedema also diabetes mellitus type 2, hypertension, congestive heart failure, and sleep apnea. She tells me that she typically sleeps in her recliner due to the fact that it is easier to get up and down when she has to go to  the restroom especially in the middle the night when she is somewhat alone. This also unfortunately means that she is often sleeping with her feet on the ground which is the worst position to be and to be honest. I discussed all this with her among other items. She is never been told about lymphedema pumps also think that could be beneficial for her based on what I am seeing currently. 08/22/2020 upon evaluation today patient appears to be doing well with regard to her legs. She actually healed up a lot faster than I even thought she would. With that being said I do think that she is getting need ongoing compression and as I discussed today she does not have that yet as I thought that we would have another week at least order that. Nonetheless I am pleased that she is closed and everything appears to be doing so well this is excellent news. 08/29/2020 upon evaluation today patient appears to be doing excellent in regard to her legs. In fact there does not appear to be anything open at this point which is excellent news. I think she has done extremely well and the main issue is that she needs to have compression at home. Her [place] wraps that we ordered on back order that is going to take a little bit longer to get them to her. Nonetheless I did discuss with her the possibility of Tubigrip in the interim where again are sure how to use the [place] wraps today. Objective Constitutional Well-nourished and well-hydrated in no acute distress. Vitals Time Taken: 8:23 AM, Height: 65 in, Weight: 300 lbs, BMI: 49.9, Temperature: 98.2 F, Pulse: 75 bpm, Respiratory Rate: 18 breaths/min, Blood Pressure: 132/76 mmHg. Respiratory normal breathing without difficulty. Psychiatric this patient is able to make decisions and demonstrates good insight into disease process. Alert and Oriented x 3. pleasant and cooperative. General Notes: Upon inspection patient's wound bed actually showed signs of  good  epithelization. There does not appear to be any evidence of active infection which is great news and overall I am extremely pleased with where things stand today. Overall I think that she is ready for discharge and does not need any additional follow-up to be honest. Assessment Harron, Baylin S. ([id number]) Active Problems ICD-10 Lymphedema, not elsewhere classified Non-pressure chronic ulcer of other part of left lower leg with other specified severity Type 2 diabetes mellitus with other skin ulcer Essential (primary) hypertension Chronic combined systolic (congestive) and diastolic (congestive) heart failure Sleep apnea, unspecified Plan Discharge From [facility]: Discharge from [facility] Wear compression garments daily. Put garments on first thing when you wake up and remove them before bed. Moisturize legs daily after removing compression garments. Edema Control - Lymphedema / Segmental Compressive Device / Other: Tubigrip double layer applied Elevate, Exercise Daily and Avoid Standing for Long Periods of Time. Elevate legs to the level of the heart and pump ankles as often as possible Elevate leg(s) parallel to the floor when sitting. 1. Would recommend currently that we going continue with the wound care measures as before and the patient is in agreement with that plan. There does not appear to be any signs of active infection and overall I am extremely pleased with where things stand currently. I think that the main thing she needs is compression and we did discuss that today she does have her [place] wraps that are on back order but should be on the way and in the interim I am going to recommend double layer Tubigrip for her. 2. Also recommend she continue to elevate her legs much as possible try to help keep edema under good control. Follow-up as needed Electronic Signature(s) Signed: 08/29/2020 10:26:49 AM By: Worthy Keeler PA-C Entered By: Worthy Mcbride on 08/29/2020 10:26:49 MARICEL, SWARTZENDRUBER ([id number]) -------------------------------------------------------------------------------- SuperBill Details Patient Name: Madison Mcbride Date of Service: 08/29/2020 Medical Record Number: [id number] Patient Account Number: [id number] Date of Birth/Sex: Nov 22, 1955 (64 y.o. F) Treating RN: Madison Mcbride Primary Care Provider: Emily Mcbride Other Clinician: Referring Provider: Emily Mcbride Treating Provider/Extender: Madison Mcbride in Treatment: 2 Diagnosis Coding ICD-10 Codes Code Description I89.0 Lymphedema, not elsewhere classified L97.828 Non-pressure chronic ulcer of other part of left lower leg with other specified severity E11.622 Type 2 diabetes mellitus with other skin ulcer I10 Essential (primary) hypertension I50.42 Chronic combined systolic (congestive) and diastolic (congestive) heart failure G47.30 Sleep apnea, unspecified Facility Procedures CPT4 Code: [id number] Description: [id number] - WOUND CARE VISIT-LEV 2 EST PT Modifier: Quantity: 1 Physician Procedures CPT4 Code: [id number] Description: 99213 - WC PHYS LEVEL 3 - EST PT Modifier: Quantity: 1 CPT4 Code: Description: ICD-10 Diagnosis Description I89.0 Lymphedema, not elsewhere classified L97.828 Non-pressure chronic ulcer of other part of left lower leg with other spe E11.622 Type 2 diabetes mellitus with other skin ulcer I10 Essential (primary)  hypertension Modifier: cified severity Quantity: Electronic Signature(s) Signed: 08/29/2020 10:27:00 AM By: Worthy Keeler PA-C Entered By: Worthy Mcbride on 08/29/2020 10:27:00

## 2020-08-30 NOTE — Progress Notes (Signed)
Madison Mcbride, Madison Mcbride ([id number]) Visit Report for 08/29/2020 Arrival Information Details Patient Name: Madison Mcbride, Madison Mcbride Date of Service: 08/29/2020 8:15 AM Medical Record Number: [id number] Patient Account Number: [id number] Date of Birth/Sex: 1955/08/05 (64 y.o. F) Treating RN: Cornell Barman Primary Care Alyaan Budzynski: Emily Filbert Other Clinician: Referring Naima Veldhuizen: Emily Filbert Treating Elizabethanne Lusher/Extender: Skipper Cliche in Treatment: 2 Visit Information History Since Last Visit Added or deleted any medications: No Patient Arrived: Wheel Chair Has Dressing in Place as Prescribed: Yes Arrival Time: 08:22 Has Compression in Place as Prescribed: Yes Accompanied By: husband Pain Present Now: No Transfer Assistance: Manual Patient Identification Verified: Yes Secondary Verification Process Completed: Yes Patient Has Alerts: Yes Patient Alerts: Type II Diabetic Electronic Signature(s) Signed: 08/30/2020 12:58:49 PM By: Gretta Cool, BSN, RN, CWS, Kim RN, BSN Entered By: Gretta Cool, BSN, RN, CWS, Kim on 08/29/2020 08:23:06 Madison Bible ([id number]) -------------------------------------------------------------------------------- Clinic Level of Care Assessment Details Patient Name: Madison Bible Date of Service: 08/29/2020 8:15 AM Medical Record Number: [id number] Patient Account Number: [id number] Date of Birth/Sex: 1955/08/18 (64 y.o. F) Treating RN: Dolan Amen Primary Care Gia Lusher: Emily Filbert Other Clinician: Referring Karagan Lehr: Emily Filbert Treating Rahshawn Remo/Extender: Skipper Cliche in Treatment: 2 Clinic Level of Care Assessment Items TOOL 4 Quantity Score X - Use when only an EandM is performed on FOLLOW-UP visit 1 0 ASSESSMENTS - Nursing Assessment / Reassessment X - Reassessment of Co-morbidities (includes updates in patient status) 1 10 X- 1 5 Reassessment of Adherence to Treatment Plan ASSESSMENTS - Wound and Skin Assessment / Reassessment []  - Simple Wound Assessment /  Reassessment - one wound 0 []  - 0 Complex Wound Assessment / Reassessment - multiple wounds []  - 0 Dermatologic / Skin Assessment (not related to wound area) ASSESSMENTS - Focused Assessment X - Circumferential Edema Measurements - multi extremities 1 5 []  - 0 Nutritional Assessment / Counseling / Intervention []  - 0 Lower Extremity Assessment (monofilament, tuning fork, pulses) []  - 0 Peripheral Arterial Disease Assessment (using hand held doppler) ASSESSMENTS - Ostomy and/or Continence Assessment and Care []  - Incontinence Assessment and Management 0 []  - 0 Ostomy Care Assessment and Management (repouching, etc.) PROCESS - Coordination of Care X - Simple Patient / Family Education for ongoing care 1 15 []  - 0 Complex (extensive) Patient / Family Education for ongoing care []  - 0 Staff obtains Programmer, systems, Records, Test Results / Process Orders []  - 0 Staff telephones HHA, Nursing Homes / Clarify orders / etc []  - 0 Routine Transfer to another Facility (non-emergent condition) []  - 0 Routine Hospital Admission (non-emergent condition) []  - 0 New Admissions / Biomedical engineer / Ordering NPWT, Apligraf, etc. []  - 0 Emergency Hospital Admission (emergent condition) X- 1 10 Simple Discharge Coordination []  - 0 Complex (extensive) Discharge Coordination PROCESS - Special Needs []  - Pediatric / Minor Patient Management 0 []  - 0 Isolation Patient Management []  - 0 Hearing / Language / Visual special needs []  - 0 Assessment of Community assistance (transportation, D/C planning, etc.) []  - 0 Additional assistance / Altered mentation []  - 0 Support Surface(s) Assessment (bed, cushion, seat, etc.) INTERVENTIONS - Wound Cleansing / Measurement Tiner, Reid S. ([id number]) []  - 0 Simple Wound Cleansing - one wound []  - 0 Complex Wound Cleansing - multiple wounds []  - 0 Wound Imaging (photographs - any number of wounds) []  - 0 Wound Tracing (instead of  photographs) []  - 0 Simple Wound Measurement - one wound []  - 0 Complex Wound Measurement - multiple wounds INTERVENTIONS - Wound  Dressings []  - Small Wound Dressing one or multiple wounds 0 []  - 0 Medium Wound Dressing one or multiple wounds []  - 0 Large Wound Dressing one or multiple wounds []  - 0 Application of Medications - topical []  - 0 Application of Medications - injection INTERVENTIONS - Miscellaneous []  - External ear exam 0 []  - 0 Specimen Collection (cultures, biopsies, blood, body fluids, etc.) []  - 0 Specimen(s) / Culture(s) sent or taken to Lab for analysis []  - 0 Patient Transfer (multiple staff / Civil Service fast streamer / Similar devices) []  - 0 Simple Staple / Suture removal (25 or less) []  - 0 Complex Staple / Suture removal (26 or more) []  - 0 Hypo / Hyperglycemic Management (close monitor of Blood Glucose) []  - 0 Ankle / Brachial Index (ABI) - do not check if billed separately X- 1 5 Vital Signs Has the patient been seen at the hospital within the last three years: Yes Total Score: 50 Level Of Care: New/Established - Level 2 Electronic Signature(s) Signed: 08/29/2020 12:41:55 PM By: Georges Mouse, Minus Breeding RN Entered By: Georges Mouse, Minus Breeding on 08/29/2020 08:46:06 Madison Bible ([id number]) -------------------------------------------------------------------------------- Encounter Discharge Information Details Patient Name: Madison Bible Date of Service: 08/29/2020 8:15 AM Medical Record Number: [id number] Patient Account Number: [id number] Date of Birth/Sex: Oct 24, 1955 (64 y.o. F) Treating RN: Dolan Amen Primary Care Casmer Yepiz: Emily Filbert Other Clinician: Referring Helayne Metsker: Emily Filbert Treating Casey Maxfield/Extender: Skipper Cliche in Treatment: 2 Encounter Discharge Information Items Discharge Condition: Stable Ambulatory Status: Wheelchair Discharge Destination: Home Transportation: Private Auto Accompanied By: husband Schedule Follow-up  Appointment: No Clinical Summary of Care: Electronic Signature(s) Signed: 08/29/2020 12:41:55 PM By: Georges Mouse, Minus Breeding RN Entered By: Georges Mouse, Minus Breeding on 08/29/2020 08:47:01 Madison Bible ([id number]) -------------------------------------------------------------------------------- Lower Extremity Assessment Details Patient Name: Madison Bible Date of Service: 08/29/2020 8:15 AM Medical Record Number: [id number] Patient Account Number: [id number] Date of Birth/Sex: Nov 03, 1955 (64 y.o. F) Treating RN: Cornell Barman Primary Care Montgomery Favor: Emily Filbert Other Clinician: Referring Zainah Steven: Emily Filbert Treating Avary Pitsenbarger/Extender: Skipper Cliche in Treatment: 2 Edema Assessment Assessed: [Left: No] [Right: No] [Left: Edema] [Right: :] Calf Left: Right: Point of Measurement: 30 cm From Medial Instep 38.5 cm 45.5 cm Ankle Left: Right: Point of Measurement: 11 cm From Medial Instep 23.5 cm 23.5 cm Knee To Floor Left: Right: From Medial Instep 38 cm 38 cm Vascular Assessment Pulses: Dorsalis Pedis Palpable: [Left:Yes] [Right:Yes] Electronic Signature(s) Signed: 08/30/2020 12:58:49 PM By: Gretta Cool, BSN, RN, CWS, Kim RN, BSN Entered By: Gretta Cool, BSN, RN, CWS, Kim on 08/29/2020 08:32:21 Madison Mcbride, Madison Mcbride ([id number]) -------------------------------------------------------------------------------- Multi Wound Chart Details Patient Name: Madison Bible Date of Service: 08/29/2020 8:15 AM Medical Record Number: [id number] Patient Account Number: [id number] Date of Birth/Sex: 08-Oct-1955 (64 y.o. F) Treating RN: Dolan Amen Primary Care Remon Quinto: Emily Filbert Other Clinician: Referring Jakaree Pickard: Emily Filbert Treating Jeanene Mena/Extender: Skipper Cliche in Treatment: 2 Vital Signs Height(in): 65 Pulse(bpm): 75 Weight(lbs): 300 Blood Pressure(mmHg): 132/76 Body Mass Index(BMI): 50 Temperature(F): 98.2 Respiratory Rate(breaths/min): 18 Wound Assessments Treatment  Notes Electronic Signature(s) Signed: 08/29/2020 12:41:55 PM By: Georges Mouse, Minus Breeding RN Entered By: Georges Mouse, Minus Breeding on 08/29/2020 08:41:24 Madison Mcbride, Madison Mcbride ([id number]) -------------------------------------------------------------------------------- [place] Details Patient Name: Madison Bible Date of Service: 08/29/2020 8:15 AM Medical Record Number: [id number] Patient Account Number: [id number] Date of Birth/Sex: 12-23-1955 (64 y.o. F) Treating RN: Dolan Amen Primary Care Mckenzy Salazar: Emily Filbert Other Clinician: Referring Keian Odriscoll: Emily Filbert Treating Jehan Ranganathan/Extender: Skipper Cliche in Treatment: 2 Active Inactive  Electronic Signature(s) Signed: 08/29/2020 12:41:55 PM By: Georges Mouse, Minus Breeding RN Entered By: Georges Mouse, Minus Breeding on 08/29/2020 08:41:17 Madison Mcbride, Madison Mcbride ([id number]) -------------------------------------------------------------------------------- Pain Assessment Details Patient Name: Madison Bible Date of Service: 08/29/2020 8:15 AM Medical Record Number: [id number] Patient Account Number: [id number] Date of Birth/Sex: [birth year] (64 y.o. F) Treating RN: Cornell Barman Primary Care Kunal Levario: Emily Filbert Other Clinician: Referring Amos Micheals: Emily Filbert Treating Dyani Babel/Extender: Skipper Cliche in Treatment: 2 Active Problems Location of Pain Severity and Description of Pain Patient Has Paino No Site Locations Pain Management and Medication Current Pain Management: Notes Patient denies pain at this time Electronic Signature(s) Signed: 08/30/2020 12:58:49 PM By: Gretta Cool, BSN, RN, CWS, Kim RN, BSN Entered By: Gretta Cool, BSN, RN, CWS, Kim on 08/29/2020 08:24:20 Madison Mcbride, Madison Mcbride ([id number]) -------------------------------------------------------------------------------- Patient/Caregiver Education Details Patient Name: Madison Bible Date of Service: 08/29/2020 8:15 AM Medical Record Number: [id number] Patient Account  Number: [id number] Date of Birth/Gender: 02-21-1956 (64 y.o. F) Treating RN: Dolan Amen Primary Care Physician: Emily Filbert Other Clinician: Referring Physician: Emily Filbert Treating Physician/Extender: Skipper Cliche in Treatment: 2 Education Assessment Education Provided To: Patient Education Topics Provided Notes Educated on applying farrow wraps Electronic Signature(s) Signed: 08/29/2020 12:41:55 PM By: Georges Mouse, Minus Breeding RN Entered By: Georges Mouse, Minus Breeding on 08/29/2020 08:46:34 Madison Mcbride, Madison Mcbride ([id number]) -------------------------------------------------------------------------------- Madison Mcbride Details Patient Name: Madison Bible Date of Service: 08/29/2020 8:15 AM Medical Record Number: [id number] Patient Account Number: [id number] Date of Birth/Sex: 04-29-1956 (64 y.o. F) Treating RN: Cornell Barman Primary Care Chue Berkovich: Emily Filbert Other Clinician: Referring Latrisha Coiro: Emily Filbert Treating Jonathan Kirkendoll/Extender: Skipper Cliche in Treatment: 2 Vital Signs Time Taken: 08:23 Temperature (F): 98.2 Height (in): 65 Pulse (bpm): 75 Weight (lbs): 300 Respiratory Rate (breaths/min): 18 Body Mass Index (BMI): 49.9 Blood Pressure (mmHg): 132/76 Reference Range: 80 - 120 mg / dl Electronic Signature(s) Signed: 08/30/2020 12:58:49 PM By: Gretta Cool, BSN, RN, CWS, Kim RN, BSN Entered By: Gretta Cool, BSN, RN, CWS, Kim on 08/29/2020 08:23:50

## 2020-12-11 IMAGING — DX DG CHEST 1V PORT
1 series · 1 of 1 positions shown · non-contrast
Comparison: 04/01/2020

CLINICAL DATA: Short of breath.  Cough

EXAM:
PORTABLE CHEST 1 VIEW

[chest ap]
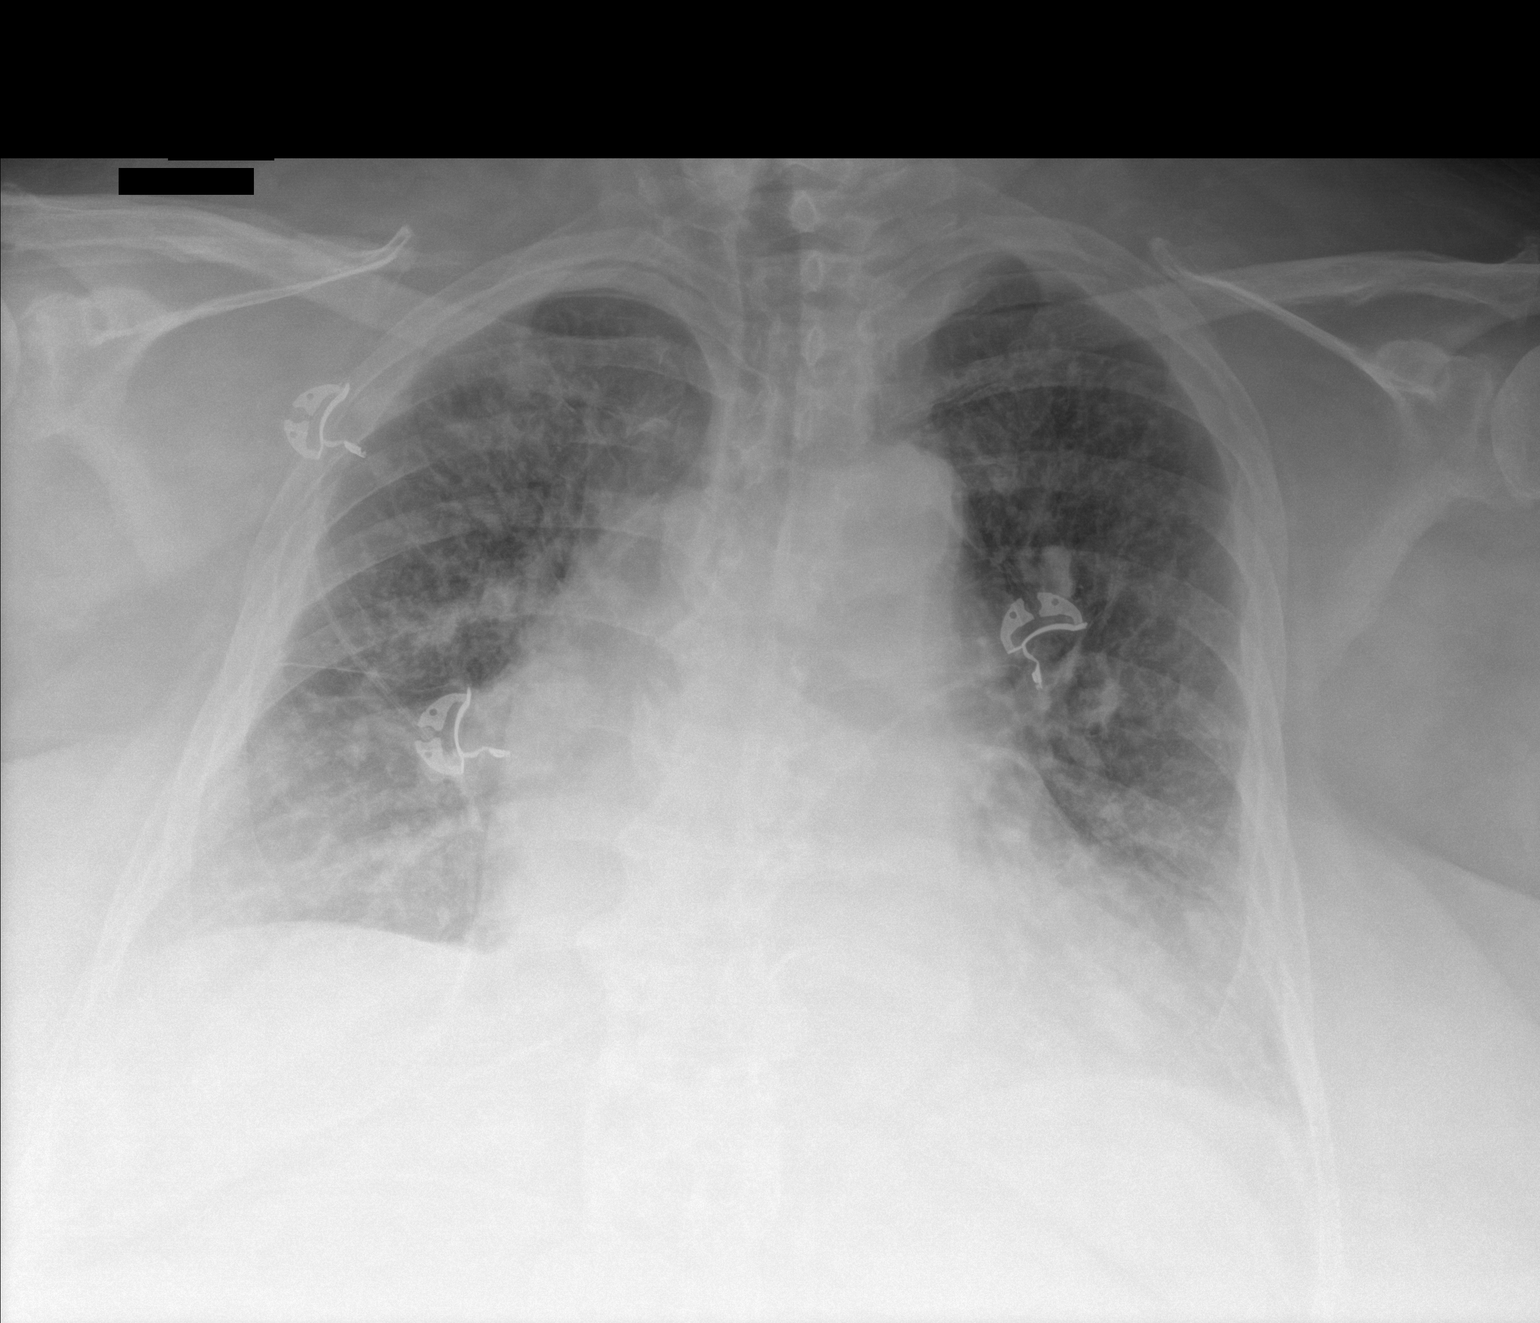

[1 of 1 positions shown; findings below may reference images not displayed]

FINDINGS: Enlarged cardiac silhouette with ectatic aorta. There is patchy
bilateral airspace disease. No focal consolidation. No pneumothorax.
Mild improvement in airspace opacities.
IMPRESSION: Patchy bilateral airspace disease suggests pulmonary edema. Mild
improvement from comparison exam.

## 2021-03-14 ENCOUNTER — Emergency Department: Payer: Medicare Other

## 2021-03-14 ENCOUNTER — Other Ambulatory Visit: Payer: Self-pay

## 2021-03-14 ENCOUNTER — Inpatient Hospital Stay
Admission: EM | Admit: 2021-03-14 | Discharge: 2021-03-24 | DRG: 291 | Disposition: A | Discharge status: Skilled Nursing Facility | Payer: Medicare Other | Attending: Internal Medicine | Admitting: Internal Medicine

## 2021-03-14 DIAGNOSIS — Z79899 Other long term (current) drug therapy: Secondary | ICD-10-CM

## 2021-03-14 DIAGNOSIS — Z79890 Hormone replacement therapy (postmenopausal): Secondary | ICD-10-CM

## 2021-03-14 DIAGNOSIS — I2510 Atherosclerotic heart disease of native coronary artery without angina pectoris: Secondary | ICD-10-CM | POA: Diagnosis present

## 2021-03-14 DIAGNOSIS — N1832 Chronic kidney disease, stage 3b: Secondary | ICD-10-CM | POA: Diagnosis present

## 2021-03-14 DIAGNOSIS — Z7982 Long term (current) use of aspirin: Secondary | ICD-10-CM

## 2021-03-14 DIAGNOSIS — I959 Hypotension, unspecified: Secondary | ICD-10-CM | POA: Diagnosis present

## 2021-03-14 DIAGNOSIS — I5033 Acute on chronic diastolic (congestive) heart failure: Secondary | ICD-10-CM

## 2021-03-14 DIAGNOSIS — I509 Heart failure, unspecified: Secondary | ICD-10-CM | POA: Diagnosis present

## 2021-03-14 DIAGNOSIS — G4733 Obstructive sleep apnea (adult) (pediatric): Secondary | ICD-10-CM | POA: Diagnosis present

## 2021-03-14 DIAGNOSIS — Z20822 Contact with and (suspected) exposure to covid-19: Secondary | ICD-10-CM | POA: Diagnosis present

## 2021-03-14 DIAGNOSIS — I5023 Acute on chronic systolic (congestive) heart failure: Secondary | ICD-10-CM | POA: Diagnosis not present

## 2021-03-14 DIAGNOSIS — M1712 Unilateral primary osteoarthritis, left knee: Secondary | ICD-10-CM | POA: Diagnosis present

## 2021-03-14 DIAGNOSIS — N179 Acute kidney failure, unspecified: Secondary | ICD-10-CM | POA: Diagnosis present

## 2021-03-14 DIAGNOSIS — Z6841 Body Mass Index (BMI) 40.0 and over, adult: Secondary | ICD-10-CM

## 2021-03-14 DIAGNOSIS — R778 Other specified abnormalities of plasma proteins: Secondary | ICD-10-CM | POA: Diagnosis not present

## 2021-03-14 DIAGNOSIS — N281 Cyst of kidney, acquired: Secondary | ICD-10-CM | POA: Diagnosis present

## 2021-03-14 DIAGNOSIS — M25462 Effusion, left knee: Secondary | ICD-10-CM

## 2021-03-14 DIAGNOSIS — I472 Ventricular tachycardia: Secondary | ICD-10-CM | POA: Diagnosis present

## 2021-03-14 DIAGNOSIS — I248 Other forms of acute ischemic heart disease: Secondary | ICD-10-CM | POA: Diagnosis present

## 2021-03-14 DIAGNOSIS — E890 Postprocedural hypothyroidism: Secondary | ICD-10-CM | POA: Diagnosis present

## 2021-03-14 DIAGNOSIS — E785 Hyperlipidemia, unspecified: Secondary | ICD-10-CM | POA: Diagnosis present

## 2021-03-14 DIAGNOSIS — Z85850 Personal history of malignant neoplasm of thyroid: Secondary | ICD-10-CM | POA: Diagnosis not present

## 2021-03-14 DIAGNOSIS — F32A Depression, unspecified: Secondary | ICD-10-CM | POA: Diagnosis present

## 2021-03-14 DIAGNOSIS — I130 Hypertensive heart and chronic kidney disease with heart failure and stage 1 through stage 4 chronic kidney disease, or unspecified chronic kidney disease: Secondary | ICD-10-CM | POA: Diagnosis present

## 2021-03-14 DIAGNOSIS — E039 Hypothyroidism, unspecified: Secondary | ICD-10-CM | POA: Diagnosis not present

## 2021-03-14 DIAGNOSIS — G2581 Restless legs syndrome: Secondary | ICD-10-CM | POA: Diagnosis present

## 2021-03-14 DIAGNOSIS — I5043 Acute on chronic combined systolic (congestive) and diastolic (congestive) heart failure: Secondary | ICD-10-CM | POA: Diagnosis present

## 2021-03-14 DIAGNOSIS — M25569 Pain in unspecified knee: Secondary | ICD-10-CM

## 2021-03-14 DIAGNOSIS — I890 Lymphedema, not elsewhere classified: Secondary | ICD-10-CM | POA: Diagnosis present

## 2021-03-14 DIAGNOSIS — E1140 Type 2 diabetes mellitus with diabetic neuropathy, unspecified: Secondary | ICD-10-CM | POA: Diagnosis present

## 2021-03-14 DIAGNOSIS — I5022 Chronic systolic (congestive) heart failure: Secondary | ICD-10-CM

## 2021-03-14 DIAGNOSIS — J9601 Acute respiratory failure with hypoxia: Secondary | ICD-10-CM | POA: Diagnosis present

## 2021-03-14 DIAGNOSIS — E1122 Type 2 diabetes mellitus with diabetic chronic kidney disease: Secondary | ICD-10-CM | POA: Diagnosis present

## 2021-03-14 LAB — COMPREHENSIVE METABOLIC PANEL
ALT: 13 U/L (ref 0–44)
AST: 25 U/L (ref 15–41)
Albumin: 3.1 g/dL — ABNORMAL LOW (ref 3.5–5.0)
Alkaline Phosphatase: 103 U/L (ref 38–126)
Anion gap: 11 (ref 5–15)
BUN: 34 mg/dL — ABNORMAL HIGH (ref 8–23)
CO2: 29 mmol/L (ref 22–32)
Calcium: 8.4 mg/dL — ABNORMAL LOW (ref 8.9–10.3)
Chloride: 100 mmol/L (ref 98–111)
Creatinine, Ser: 1.27 mg/dL — ABNORMAL HIGH (ref 0.44–1.00)
GFR, Estimated: 47 mL/min — ABNORMAL LOW (ref >60)
Glucose, Bld: 92 mg/dL (ref 70–99)
Potassium: 4.2 mmol/L (ref 3.5–5.1)
Sodium: 140 mmol/L (ref 135–145)
Total Bilirubin: 2 mg/dL — ABNORMAL HIGH (ref 0.3–1.2)
Total Protein: 7.4 g/dL (ref 6.5–8.1)

## 2021-03-14 LAB — CBC WITH DIFFERENTIAL/PLATELET
Abs Immature Granulocytes: 0.06 10*3/uL (ref 0.00–0.07)
Basophils Absolute: 0.1 10*3/uL (ref 0.0–0.1)
Basophils Relative: 1 %
Eosinophils Absolute: 0.1 10*3/uL (ref 0.0–0.5)
Eosinophils Relative: 1 %
HCT: 45.8 % (ref 36.0–46.0)
Hemoglobin: 13.2 g/dL (ref 12.0–15.0)
Immature Granulocytes: 1 %
Lymphocytes Relative: 16 %
Lymphs Abs: 1.8 10*3/uL (ref 0.7–4.0)
MCH: 24 pg — ABNORMAL LOW (ref 26.0–34.0)
MCHC: 28.8 g/dL — ABNORMAL LOW (ref 30.0–36.0)
MCV: 83.4 fL (ref 80.0–100.0)
Monocytes Absolute: 0.8 10*3/uL (ref 0.1–1.0)
Monocytes Relative: 7 %
Neutro Abs: 8.4 10*3/uL — ABNORMAL HIGH (ref 1.7–7.7)
Neutrophils Relative %: 74 %
Platelets: 429 10*3/uL — ABNORMAL HIGH (ref 150–400)
RBC: 5.49 MIL/uL — ABNORMAL HIGH (ref 3.87–5.11)
RDW: 20 % — ABNORMAL HIGH (ref 11.5–15.5)
WBC: 11.2 10*3/uL — ABNORMAL HIGH (ref 4.0–10.5)
nRBC: 0.3 % — ABNORMAL HIGH (ref 0.0–0.2)

## 2021-03-14 LAB — URINALYSIS, COMPLETE (UACMP) WITH MICROSCOPIC
Glucose, UA: NEGATIVE mg/dL
Ketones, ur: NEGATIVE mg/dL
Nitrite: NEGATIVE
Protein, ur: 100 mg/dL — AB
Specific Gravity, Urine: >1.03 — ABNORMAL HIGH (ref 1.005–1.030)
WBC, UA: >50 WBC/hpf — ABNORMAL HIGH (ref 0–5)
pH: 5.5 (ref 5.0–8.0)

## 2021-03-14 LAB — RESP PANEL BY RT-PCR (FLU A&B, COVID) ARPGX2
Influenza A by PCR: NEGATIVE
Influenza B by PCR: NEGATIVE
SARS Coronavirus 2 by RT PCR: NEGATIVE

## 2021-03-14 LAB — TROPONIN I (HIGH SENSITIVITY): Troponin I (High Sensitivity): 20 ng/L — ABNORMAL HIGH (ref <18)

## 2021-03-14 LAB — BRAIN NATRIURETIC PEPTIDE: B Natriuretic Peptide: 1805.1 pg/mL — ABNORMAL HIGH (ref 0.0–100.0)

## 2021-03-14 LAB — LACTIC ACID, PLASMA: Lactic Acid, Venous: 2.5 mmol/L (ref 0.5–1.9)

## 2021-03-14 MED ORDER — FUROSEMIDE 10 MG/ML IJ SOLN
60.0000 mg | Freq: Once | INTRAMUSCULAR | Status: AC
  Administered 2021-03-14: 60 mg via INTRAVENOUS
  Filled 2021-03-14: qty 8

## 2021-03-14 MED ORDER — ONDANSETRON HCL 4 MG/2ML IJ SOLN: 4.0000 mg | Freq: Four times a day (QID) | INTRAMUSCULAR | Status: DC | PRN

## 2021-03-14 MED ORDER — ASPIRIN-ACETAMINOPHEN-CAFFEINE 250-250-65 MG PO TABS
2.0000 | ORAL_TABLET | Freq: Once | ORAL | Status: AC
  Administered 2021-03-14: 2 via ORAL
  Filled 2021-03-14: qty 2

## 2021-03-14 MED ORDER — ATORVASTATIN CALCIUM 20 MG PO TABS
20.0000 mg | ORAL_TABLET | Freq: Every day | ORAL | Status: DC
  Administered 2021-03-15 – 2021-03-23 (×9): 20 mg via ORAL
  Filled 2021-03-14 (×9): qty 1

## 2021-03-14 MED ORDER — BUPROPION HCL ER (SR) 150 MG PO TB12
150.0000 mg | ORAL_TABLET | Freq: Every day | ORAL | Status: DC
  Administered 2021-03-15 – 2021-03-24 (×10): 150 mg via ORAL
  Filled 2021-03-14 (×10): qty 1

## 2021-03-14 MED ORDER — ONDANSETRON HCL 4 MG PO TABS: 4.0000 mg | ORAL_TABLET | Freq: Four times a day (QID) | ORAL | Status: DC | PRN

## 2021-03-14 MED ORDER — ACETAMINOPHEN 650 MG RE SUPP: 650.0000 mg | Freq: Four times a day (QID) | RECTAL | Status: DC | PRN

## 2021-03-14 MED ORDER — ACETAMINOPHEN 500 MG PO TABS
1000.0000 mg | ORAL_TABLET | Freq: Four times a day (QID) | ORAL | Status: DC | PRN
  Filled 2021-03-14: qty 2

## 2021-03-14 MED ORDER — ROPINIROLE HCL 1 MG PO TABS
3.0000 mg | ORAL_TABLET | Freq: Three times a day (TID) | ORAL | Status: DC
  Administered 2021-03-15 – 2021-03-24 (×27): 3 mg via ORAL
  Filled 2021-03-14 (×29): qty 3

## 2021-03-14 MED ORDER — SODIUM CHLORIDE 0.9% FLUSH
3.0000 mL | Freq: Two times a day (BID) | INTRAVENOUS | Status: DC
  Administered 2021-03-15 – 2021-03-24 (×15): 3 mL via INTRAVENOUS

## 2021-03-14 MED ORDER — ASPIRIN EC 81 MG PO TBEC
81.0000 mg | DELAYED_RELEASE_TABLET | Freq: Every day | ORAL | Status: DC
  Administered 2021-03-16 – 2021-03-24 (×9): 81 mg via ORAL
  Filled 2021-03-14 (×10): qty 1

## 2021-03-14 MED ORDER — FUROSEMIDE 10 MG/ML IJ SOLN
40.0000 mg | Freq: Two times a day (BID) | INTRAMUSCULAR | Status: DC
  Administered 2021-03-15: 40 mg via INTRAVENOUS
  Filled 2021-03-14: qty 4

## 2021-03-14 MED ORDER — ENOXAPARIN SODIUM 80 MG/0.8ML IJ SOSY
0.5000 mg/kg | PREFILLED_SYRINGE | INTRAMUSCULAR | Status: DC
  Administered 2021-03-15 – 2021-03-16 (×2): 75 mg via SUBCUTANEOUS
  Filled 2021-03-14 (×2): qty 0.75
  Filled 2021-03-14: qty 0.8
  Filled 2021-03-14: qty 0.75

## 2021-03-14 MED ORDER — ACETAMINOPHEN 325 MG PO TABS
650.0000 mg | ORAL_TABLET | Freq: Four times a day (QID) | ORAL | Status: DC | PRN
  Administered 2021-03-16 – 2021-03-23 (×6): 650 mg via ORAL
  Filled 2021-03-14 (×6): qty 2

## 2021-03-14 MED ORDER — OXYCODONE HCL 5 MG PO TABS
5.0000 mg | ORAL_TABLET | ORAL | Status: DC | PRN
  Administered 2021-03-15 – 2021-03-22 (×4): 5 mg via ORAL
  Filled 2021-03-14 (×4): qty 1

## 2021-03-14 MED ORDER — POLYETHYLENE GLYCOL 3350 17 G PO PACK: 17.0000 g | PACK | Freq: Every day | ORAL | Status: DC | PRN

## 2021-03-14 MED ORDER — POTASSIUM CHLORIDE CRYS ER 20 MEQ PO TBCR
40.0000 meq | EXTENDED_RELEASE_TABLET | Freq: Two times a day (BID) | ORAL | Status: DC
  Administered 2021-03-15 – 2021-03-16 (×4): 40 meq via ORAL
  Filled 2021-03-14: qty 4
  Filled 2021-03-14 (×2): qty 2
  Filled 2021-03-14: qty 4

## 2021-03-14 MED ORDER — LEVOTHYROXINE SODIUM 50 MCG PO TABS
250.0000 ug | ORAL_TABLET | Freq: Every day | ORAL | Status: DC
  Administered 2021-03-15 – 2021-03-24 (×10): 250 ug via ORAL
  Filled 2021-03-14 (×10): qty 1

## 2021-03-14 NOTE — ED Triage Notes (Signed)
Patient presents with husband stating she was over at the clinic getting checked out for a UTI and patient had some hypoxia. Initial sats 81% in triage. Patient reports productive cough with clear mucus. Reports pain with urination.

## 2021-03-14 NOTE — ED Provider Notes (Addendum)
[facility] Emergency Department Provider Note  Time seen: 6:32 PM  I have reviewed the triage vital signs and the nursing notes.   HISTORY  Chief Complaint Hypoxia   HPI Madison Mcbride is a 65 y.o. female with a past medical history of diabetes, CHF, hyperlipidemia, obesity, CKD, presents to the emergency department for hypoxia.  According to the patient she has been experiencing urinary symptoms, finished a course of antibiotics approximately 1 week ago but still having some mild burning upon urination so she went to her doctor today.  However when they were doing vital signs on her they noted her to be hypoxic around 80% on room air.  Patient was sent to the emergency department for further work-up.  Patient noted to be 81% on room air, satting in the mid 90s on 2 L.  Patient has no O2 baseline requirement.  Does state a history of CHF and takes diuretics at baseline.  Has lower extremity edema but states this is typical for her.  Denies any chest pain at any point.  Denies any fever.  States she has had a cough but states has been ongoing for several weeks.   Past Medical History:  Diagnosis Date   Diabetes mellitus without complication (HCC)    Hypothyroidism    RLS (restless legs syndrome)    Thyroid cancer ([place]) 2007    Patient Active Problem List   Diagnosis Date Noted   Acute on chronic diastolic CHF (congestive heart failure) ([place]) 04/09/2020   Diabetes mellitus without complication ([place]) [id number]   HLD (hyperlipidemia) 04/09/2020   Depression 04/09/2020   Elevated troponin 04/09/2020   OSA (obstructive sleep apnea) 04/09/2020   Lumbar radiculopathy, acute    Acute midline low back pain without sciatica    Type 2 diabetes mellitus with diabetic neuropathy, without long-term current use of insulin ([place]) 02/21/2020   Hypothyroidism 02/21/2020   Morbid obesity with BMI of 50.0-59.9, adult ([place]) 02/21/2020   Severe sepsis ([place]) 02/21/2020    CAP (community acquired pneumonia) 02/21/2020   Bilateral cellulitis of lower leg 02/21/2020   Acute respiratory failure with hypoxia and hypercapnia ([place]) 02/21/2020   Acute renal failure superimposed on stage 3a chronic kidney disease ([place]) [id number]   Acute metabolic encephalopathy [id number]   Septic shock ([place]) 02/21/2020    Past Surgical History:  Procedure Laterality Date   ABDOMINAL HYSTERECTOMY     COLONOSCOPY WITH PROPOFOL N/A 08/26/2015   Procedure: COLONOSCOPY WITH PROPOFOL;  Surgeon: Manya Silvas, MD;  Location: [place];  Service: Endoscopy;  Laterality: N/A;   THYROIDECTOMY      Prior to Admission medications   Medication Sig Start Date End Date Taking? Authorizing Provider  acetaminophen (TYLENOL) 500 MG tablet Take 1,000 mg by mouth every 8 (eight) hours.    [provider]  aspirin EC 81 MG EC tablet Take 1 tablet (81 mg total) by mouth daily. Swallow whole. 04/18/20   Lorella Nimrod, MD  atorvastatin (LIPITOR) 20 MG tablet Take 20 mg by mouth daily at 10 pm. 01/31/20 01/30/21  [provider]  Biotin 2.5 MG CAPS Take 2.5 mg by mouth daily.     [provider]  buPROPion (WELLBUTRIN SR) 150 MG 12 hr tablet Take 150 mg by mouth daily. 03/22/20   [provider]  Cholecalciferol (D3-1000) 25 MCG (1000 UT) tablet Take 1,000 Units by mouth daily.    [provider]  dextromethorphan-guaiFENesin (MUCINEX DM) 30-600 MG 12hr tablet Take 1 tablet  by mouth 2 (two) times daily as needed for cough. 04/18/20   Lorella Nimrod, MD  furosemide (LASIX) 40 MG tablet Take 40 mg by mouth daily. 01/27/20   [provider]  gabapentin (NEURONTIN) 100 MG capsule Take 2 capsules (200 mg total) by mouth 3 (three) times daily. Patient taking differently: Take 300 mg by mouth 3 (three) times daily.  03/01/20 04/09/20  Loletha Grayer, MD  methocarbamol (ROBAXIN) 500 MG tablet Take 1 tablet (500 mg total) by mouth every 8 (eight) hours as  needed for muscle spasms. Patient not taking: Reported on 04/09/2020 03/01/20   Loletha Grayer, MD  midodrine (PROAMATINE) 10 MG tablet Take 1 tablet (10 mg total) by mouth 3 (three) times daily with meals as needed (hypotension SBP <100). 04/18/20   Lorella Nimrod, MD  Multiple Vitamin (MULTIVITAMIN) tablet Take 1 tablet by mouth daily.    [provider]  nystatin (MYCOSTATIN/NYSTOP) powder Apply topically 3 (three) times daily. Patient not taking: Reported on 04/09/2020 03/01/20   Loletha Grayer, MD  OZEMPIC, 0.25 OR 0.5 MG/DOSE, 2 MG/1.5ML SOPN Inject 0.375 mLs into the skin every Saturday.  02/04/20   [provider]  potassium chloride SA (KLOR-CON) 20 MEQ tablet Take 1 tablet (20 mEq total) by mouth daily. Patient not taking: Reported on 04/09/2020 02/27/20 03/28/20  Kayleen Memos, DO  rOPINIRole (REQUIP) 3 MG tablet Take 3 mg by mouth 3 (three) times daily. 01/20/18   [provider]  SYNTHROID 125 MCG tablet Take 250 mcg by mouth every morning. 01/30/20   [provider]  zinc gluconate 50 MG tablet Take 50 mg by mouth daily.    [provider]    No Known Allergies  Family History  Problem Relation Age of Onset   Breast cancer Neg Hx     Social History Social History   Tobacco Use   Smoking status: Never   Smokeless tobacco: Never  Substance Use Topics   Alcohol use: No   Drug use: No    Review of Systems Constitutional: Negative for fever. Cardiovascular: Negative for chest pain. Respiratory: Negative for shortness of breath.  Positive for cough. Gastrointestinal: Negative for abdominal pain, vomiting  Genitourinary: Mild urinary burning. Musculoskeletal: Lower extreme edema but states chronic and stable. Skin: Negative for skin complaints  Neurological: Negative for headache All other ROS negative  ____________________________________________   PHYSICAL EXAM:  VITAL SIGNS: ED Triage Vitals  Enc Vitals Group     BP  03/14/21 1802 121/83     Pulse Rate 03/14/21 1802 (!) 50     Resp 03/14/21 1802 20     Temp 03/14/21 1802 98.7 F (37.1 C)     Temp Source 03/14/21 1802 Oral     SpO2 03/14/21 1802 (!) 81 %     Weight 03/14/21 1805 (!) 334 lb (151.5 kg)     Height 03/14/21 1805 '5\' 4"'$  (1.626 m)     Head Circumference --      Peak Flow --      Pain Score 03/14/21 1804 0     Pain Loc --      Pain Edu? --      Excl. in [place]? --    Constitutional: Alert and oriented. Well appearing and in no distress. Eyes: Normal exam ENT      Head: Normocephalic and atraumatic.      Mouth/Throat: Mucous membranes are moist. Cardiovascular: Normal rate, regular rhythm.  Respiratory: Normal respiratory effort without tachypnea nor retractions. Breath  sounds are clear  Gastrointestinal: Soft and nontender. No distention.  Musculoskeletal: 2+ lower extremity edema, equal bilaterally.  Nontender. Neurologic:  Normal speech and language. No gross focal neurologic deficits Skin:  Skin is warm, dry and intact.  Psychiatric: Mood and affect are normal. Speech and behavior are normal.   ____________________________________________    EKG  EKG viewed and interpreted by myself.  Shows sinus rhythm at 59 bpm with a narrow QRS, left axis deviation, slight PR prolongation as well as QTC prolongation.  Nonspecific but no concerning ST changes.  ____________________________________________    RADIOLOGY  X-ray consistent with mild pulmonary edema.  ____________________________________________   INITIAL IMPRESSION / ASSESSMENT AND PLAN / ED COURSE  Pertinent labs & imaging results that were available during my care of the patient were reviewed by me and considered in my medical decision making (see chart for details).   Patient presents to the emergency department for hypoxia.  Patient denies any significant shortness of breath states mild shortness of breath but states that is fairly chronic for her.  Denies any chest pain  or fever.  Has had a cough over the past several weeks but denies any worsening recently.  No pleuritic pain.  Has lower extremity edema but states this is fairly typical/stable amount for her.  Patient currently satting around 81% on room air.  Patient satting in the mid upper 90s on 2 L.  We will check labs, chest x-ray, BNP and troponin.  We will obtain an EKG and continue to closely monitor the patient on cardiac monitoring.  Differential is quite broad at this time but would include CHF exacerbation/pulmonary edema, pneumonia, pneumothorax, ACS, less likely PE.  Patient's work-up is consistent with likely CHF exacerbation with pulmonary edema on chest x-ray.  Lower extremity edema on exam.  Patient satting in the mid 90s on 2 L of oxygen.  We will begin IV Lasix admit to the hospital service for further work-up and treatment.  Tatanya Southerly was evaluated in Emergency Department on 03/14/2021 for the symptoms described in the history of present illness. She was evaluated in the context of the global COVID-19 pandemic, which necessitated consideration that the patient might be at risk for infection with the SARS-CoV-2 virus that causes COVID-19. Institutional protocols and algorithms that pertain to the evaluation of patients at risk for COVID-19 are in a state of rapid change based on information released by regulatory bodies including the CDC and federal and state organizations. These policies and algorithms were followed during the patient's care in the ED.  ____________________________________________   FINAL CLINICAL IMPRESSION(S) / ED DIAGNOSES  Hypoxia CHF exacerbation   Harvest Dark, MD 03/14/21 1950    Harvest Dark, MD 03/14/21 1950

## 2021-03-14 NOTE — H&P (Signed)
[facility] History and Physical  Darrylin Devalk [id number] DOB: 12/14/1955 DOA: 03/14/2021  Referring physician: Dr. Kerman Passey PCP: Rusty Aus, MD   Chief Complaint: low oxygen level  HPI: Madison Mcbride is a 65 y.o. female with history of diastolic CHF, morbid obesity, hypothyroidism, type 2 diabetes, OSA, lymphedema, CKD, who presents with shortness of breath.  Patient reports that she went to the beach last weekend and since returning 5 days ago has continued to feel increasingly unwell.  She reports a general sense of fatigue and inability to do much of anything.  She also endorses worsening shortness of breath.  Denies any chest pain currently though says she has had it intermittently over the past few days.  Endorses a cough but states that has been going on for a couple of weeks.  When asked about her history of heart issues she denies any history of heart problems.  When asked why she takes a water pill she states it is for fluid in her legs.  When asked why she has fluid in her legs she states she does not know.  In the ED vital signs notable for hypoxia on arrival to 81% that improved on 3 L nasal cannula, remainder of vital signs notable only for hypertension.  CBC was largely unremarkable.  BNP was elevated to 1805.  Troponin was borderline elevated at 20.  CMP showed bilirubin of 2 but otherwise remainder of values were at her baseline.  Chest x-ray showed cardiomegaly and pulmonary edema.  EKG was pending.  She was started on IV diuretics and admitted for further management of her hypoxemic respiratory failure.  Review of Systems:  Pertinent positives and negative per HPI, all others reviewed and negative  Past Medical History:  Diagnosis Date   Diabetes mellitus without complication ([place])    Hypothyroidism    RLS (restless legs syndrome)    Thyroid cancer ([place]) 2007   Past Surgical History:  Procedure Laterality Date   ABDOMINAL HYSTERECTOMY      COLONOSCOPY WITH PROPOFOL N/A 08/26/2015   Procedure: COLONOSCOPY WITH PROPOFOL;  Surgeon: Manya Silvas, MD;  Location: [facility];  Service: Endoscopy;  Laterality: N/A;   THYROIDECTOMY     Social History:  reports that she has never smoked. She has never used smokeless tobacco. She reports that she does not drink alcohol and does not use drugs.  No Known Allergies  Family History  Problem Relation Age of Onset   Breast cancer Neg Hx      Prior to Admission medications   Medication Sig Start Date End Date Taking? Authorizing Provider  acetaminophen (TYLENOL) 500 MG tablet Take 1,000 mg by mouth every 8 (eight) hours.    [provider]  aspirin EC 81 MG EC tablet Take 1 tablet (81 mg total) by mouth daily. Swallow whole. 04/18/20   Lorella Nimrod, MD  atorvastatin (LIPITOR) 20 MG tablet Take 20 mg by mouth daily at 10 pm. 01/31/20 01/30/21  [provider]  Biotin 2.5 MG CAPS Take 2.5 mg by mouth daily.     [provider]  buPROPion (WELLBUTRIN SR) 150 MG 12 hr tablet Take 150 mg by mouth daily. 03/22/20   [provider]  Cholecalciferol (D3-1000) 25 MCG (1000 UT) tablet Take 1,000 Units by mouth daily.    [provider]  dextromethorphan-guaiFENesin (MUCINEX DM) 30-600 MG 12hr tablet Take 1 tablet by mouth 2 (two) times daily as needed for cough. 04/18/20   Lorella Nimrod, MD  furosemide (LASIX) 40 MG tablet Take 40 mg by mouth daily. 01/27/20   [provider]  gabapentin (NEURONTIN) 100 MG capsule Take 2 capsules (200 mg total) by mouth 3 (three) times daily. Patient taking differently: Take 300 mg by mouth 3 (three) times daily.  03/01/20 04/09/20  Loletha Grayer, MD  methocarbamol (ROBAXIN) 500 MG tablet Take 1 tablet (500 mg total) by mouth every 8 (eight) hours as needed for muscle spasms. Patient not taking: Reported on 04/09/2020 03/01/20   Loletha Grayer, MD  midodrine (PROAMATINE) 10 MG tablet Take 1 tablet (10 mg  total) by mouth 3 (three) times daily with meals as needed (hypotension SBP <100). 04/18/20   Lorella Nimrod, MD  Multiple Vitamin (MULTIVITAMIN) tablet Take 1 tablet by mouth daily.    [provider]  nystatin (MYCOSTATIN/NYSTOP) powder Apply topically 3 (three) times daily. Patient not taking: Reported on 04/09/2020 03/01/20   Loletha Grayer, MD  OZEMPIC, 0.25 OR 0.5 MG/DOSE, 2 MG/1.5ML SOPN Inject 0.375 mLs into the skin every Saturday.  02/04/20   [provider]  potassium chloride SA (KLOR-CON) 20 MEQ tablet Take 1 tablet (20 mEq total) by mouth daily. Patient not taking: Reported on 04/09/2020 02/27/20 03/28/20  Kayleen Memos, DO  rOPINIRole (REQUIP) 3 MG tablet Take 3 mg by mouth 3 (three) times daily. 01/20/18   [provider]  SYNTHROID 125 MCG tablet Take 250 mcg by mouth every morning. 01/30/20   [provider]  zinc gluconate 50 MG tablet Take 50 mg by mouth daily.    [provider]   Physical Exam: Vitals:   03/14/21 1812 03/14/21 1830 03/14/21 1900 03/14/21 2340  BP:  120/78 (!) 129/59 (!) 168/142  Pulse:  (!) 57 67 (!) 58  Resp:  '15 16 16  '$ Temp:      TempSrc:      SpO2: 91% 95% 92% 98%  Weight:      Height:        Wt Readings from Last 3 Encounters:  03/14/21 (!) 151.5 kg  04/18/20 (!) 145.1 kg  02/22/20 (!) 145.2 kg     General:  Appears mildly ill Eyes: PERRL, normal lids, irises & conjunctiva ENT: grossly normal hearing, lips & tongue Neck: no masses Cardiovascular: RRR, no m/r/g, but difficult exam due to habitus. +LE edema. Respiratory: CTA bilaterally though very quiet breath sounds overall, no w/r/r.  Mildly increased work of breathing Abdomen: soft, ntnd Skin: no rash or induration seen on limited exam Musculoskeletal: grossly normal tone BUE/BLE Psychiatric: grossly normal mood and affect, speech fluent and appropriate Neurologic: grossly non-focal.          Labs on Admission:  Basic Metabolic  Panel: Recent Labs  Lab 03/14/21 1817  NA 140  K 4.2  CL 100  CO2 29  GLUCOSE 92  BUN 34*  CREATININE 1.27*  CALCIUM 8.4*   Liver Function Tests: Recent Labs  Lab 03/14/21 1817  AST 25  ALT 13  ALKPHOS 103  BILITOT 2.0*  PROT 7.4  ALBUMIN 3.1*   No results for input(s): LIPASE, AMYLASE in the last 168 hours. No results for input(s): AMMONIA in the last 168 hours. CBC: Recent Labs  Lab 03/14/21 1817  WBC 11.2*  NEUTROABS 8.4*  HGB 13.2  HCT 45.8  MCV 83.4  PLT 429*   Cardiac Enzymes: No results for input(s): CKTOTAL, CKMB, CKMBINDEX, TROPONINI in the last 168 hours.  BNP (last 3 results) Recent Labs    04/09/20 1303  03/14/21 1817  BNP 941.8* 1,805.1*    ProBNP (last 3 results) No results for input(s): PROBNP in the last 8760 hours.  CBG: No results for input(s): GLUCAP in the last 168 hours.  Radiological Exams on Admission: No results found.  EKG: Pending  Assessment/Plan Principal Problem:   Acute on chronic diastolic CHF (congestive heart failure) (HCC) Active Problems:   Type 2 diabetes mellitus with diabetic neuropathy, without long-term current use of insulin (HCC)   Hypothyroidism   Morbid obesity with BMI of 50.0-59.9, adult (HCC)   OSA (obstructive sleep apnea)   Acute hypoxemic respiratory failure (HCC)  Kinna Greenia Valido is a 65 y.o. female with history of diastolic CHF, morbid obesity, hypothyroidism, type 2 diabetes, OSA, lymphedema, CKD, who presents with shortness of breath and found to have acute CHF exacerbation.  #Acute on chronic diastolic CHF #Acute hypoxemic respiratory failure Presenting with signs and symptoms of acute CHF exacerbation, etiology not clear.  Patient seems to be unaware of diagnosis. - Lasix 40 mg IV twice daily - Daily weights and strict INO - Continuous telemetry - K greater than 4, mag greater than 2 - Repeat echo, consider cardiology consultation pending result - Continuous pulse ox, wean O2 as  tolerated  #Elevated troponin Borderline elevation, suspect driven by volume overload.  Trend troponin.  #Chronic medical problems NB: Med rec completed prior to pharmacy tech review, will need to be reviewed again in a.m. CAD-continue aspirin, atorvastatin Depression-continue bupropion Neuropathy-hold gabapentin Hypotension-hold midodrine Restless leg syndrome-continue ropinirole Hypothyroidism-continue Synthroid DM2-hold home Ozempic, sensitive insulin sliding scale only for time being with 4 times daily Accu-Cheks  Code Status: Full code DVT Prophylaxis: Lovenox Family Communication: Husband updated during encounter Disposition Plan: Inpatient, MedSurg  Time spent: 57 min  Clarnce Flock MD/MPH [facility]  Note:  This document was prepared using Systems analyst and may include unintentional dictation errors.

## 2021-03-14 NOTE — ED Notes (Signed)
Pt with spouse at bedside. NADN

## 2021-03-14 NOTE — ED Notes (Signed)
Pt given a cup of water. Pt requesting something for a headache. Erlene Quan, RN made aware. Pt has no further needs at this time. Family at bedside and call light within reach.

## 2021-03-15 LAB — CBC
HCT: 43.8 % (ref 36.0–46.0)
Hemoglobin: 12.4 g/dL (ref 12.0–15.0)
MCH: 23.8 pg — ABNORMAL LOW (ref 26.0–34.0)
MCHC: 28.3 g/dL — ABNORMAL LOW (ref 30.0–36.0)
MCV: 83.9 fL (ref 80.0–100.0)
Platelets: 397 10*3/uL (ref 150–400)
RBC: 5.22 MIL/uL — ABNORMAL HIGH (ref 3.87–5.11)
RDW: 19.9 % — ABNORMAL HIGH (ref 11.5–15.5)
WBC: 10.6 10*3/uL — ABNORMAL HIGH (ref 4.0–10.5)
nRBC: 0.2 % (ref 0.0–0.2)

## 2021-03-15 LAB — GLUCOSE, CAPILLARY
Glucose-Capillary: 100 mg/dL — ABNORMAL HIGH (ref 70–99)
Glucose-Capillary: 97 mg/dL (ref 70–99)

## 2021-03-15 LAB — HEMOGLOBIN A1C
Hgb A1c MFr Bld: 6.3 % — ABNORMAL HIGH (ref 4.8–5.6)
Mean Plasma Glucose: 134.11 mg/dL

## 2021-03-15 LAB — BASIC METABOLIC PANEL
Anion gap: 7 (ref 5–15)
BUN: 34 mg/dL — ABNORMAL HIGH (ref 8–23)
CO2: 32 mmol/L (ref 22–32)
Calcium: 8.1 mg/dL — ABNORMAL LOW (ref 8.9–10.3)
Chloride: 103 mmol/L (ref 98–111)
Creatinine, Ser: 1.34 mg/dL — ABNORMAL HIGH (ref 0.44–1.00)
GFR, Estimated: 44 mL/min — ABNORMAL LOW (ref >60)
Glucose, Bld: 87 mg/dL (ref 70–99)
Potassium: 4.2 mmol/L (ref 3.5–5.1)
Sodium: 142 mmol/L (ref 135–145)

## 2021-03-15 LAB — TROPONIN I (HIGH SENSITIVITY)
Troponin I (High Sensitivity): 19 ng/L — ABNORMAL HIGH (ref <18)
Troponin I (High Sensitivity): 21 ng/L — ABNORMAL HIGH (ref <18)

## 2021-03-15 LAB — CBG MONITORING, ED
Glucose-Capillary: 79 mg/dL (ref 70–99)
Glucose-Capillary: 93 mg/dL (ref 70–99)

## 2021-03-15 MED ORDER — GUAIFENESIN-DM 100-10 MG/5ML PO SYRP
5.0000 mL | ORAL_SOLUTION | ORAL | Status: DC | PRN
  Administered 2021-03-15: 5 mL via ORAL
  Administered 2021-03-15: 2.5 mL via ORAL
  Administered 2021-03-23: 5 mL via ORAL
  Filled 2021-03-15 (×3): qty 5

## 2021-03-15 MED ORDER — ASPIRIN-ACETAMINOPHEN-CAFFEINE 250-250-65 MG PO TABS
1.0000 | ORAL_TABLET | Freq: Four times a day (QID) | ORAL | Status: DC | PRN
  Administered 2021-03-15: 1 via ORAL
  Filled 2021-03-15 (×3): qty 1

## 2021-03-15 MED ORDER — INSULIN ASPART 100 UNIT/ML IJ SOLN
0.0000 [IU] | Freq: Three times a day (TID) | INTRAMUSCULAR | Status: DC
  Administered 2021-03-19: 2 [IU] via SUBCUTANEOUS
  Administered 2021-03-20: 5 [IU] via SUBCUTANEOUS
  Administered 2021-03-24: 1 [IU] via SUBCUTANEOUS
  Filled 2021-03-15 (×5): qty 1

## 2021-03-15 MED ORDER — HYDROCERIN EX CREA
TOPICAL_CREAM | Freq: Two times a day (BID) | CUTANEOUS | Status: DC
  Administered 2021-03-19 – 2021-03-20 (×2): 1 via TOPICAL
  Filled 2021-03-15 (×2): qty 113

## 2021-03-15 MED ORDER — GABAPENTIN 300 MG PO CAPS
300.0000 mg | ORAL_CAPSULE | Freq: Once | ORAL | Status: AC
  Administered 2021-03-15: 300 mg via ORAL
  Filled 2021-03-15: qty 1

## 2021-03-15 MED ORDER — FUROSEMIDE 10 MG/ML IJ SOLN
20.0000 mg | Freq: Two times a day (BID) | INTRAMUSCULAR | Status: DC
  Administered 2021-03-15 – 2021-03-16 (×2): 20 mg via INTRAVENOUS
  Filled 2021-03-15 (×2): qty 2

## 2021-03-15 MED ORDER — GUAIFENESIN ER 600 MG PO TB12
600.0000 mg | ORAL_TABLET | Freq: Two times a day (BID) | ORAL | Status: DC
  Administered 2021-03-15 – 2021-03-24 (×19): 600 mg via ORAL
  Filled 2021-03-15 (×19): qty 1

## 2021-03-15 NOTE — Progress Notes (Signed)
Anticoagulation monitoring(Lovenox):  65 yo female ordered Lovenox 40 mg Q24h    Filed Weights   03/14/21 1805  Weight: (!) 151.5 kg (334 lb)   BMI  57.3   Lab Results  Component Value Date   CREATININE 1.27 (H) 03/14/2021   CREATININE 1.00 04/18/2020   CREATININE 1.06 (H) 04/17/2020   Estimated Creatinine Clearance: 64.2 mL/min (A) (by C-G formula based on SCr of 1.27 mg/dL (H)). Hemoglobin & Hematocrit     Component Value Date/Time   HGB 13.2 03/14/2021 1817   HGB 12.0 01/18/2014 0617   HCT 45.8 03/14/2021 1817   HCT 36.8 01/18/2014 0617     Per Protocol for Patient with estCrcl > 30 ml/min and BMI >  30, will transition to Lovenox 75  mg Q24h.

## 2021-03-15 NOTE — ED Notes (Signed)
Pt enjoying breakfast. Lotion applied to pt's legs

## 2021-03-15 NOTE — Consult Note (Signed)
[facility] Nurse Consult Note: Reason for Consult:Bilateral LEs with erythema, dryness.  LLE with medial and anterior wounds. Wound type:venous insufficiency Pressure Injury POA: N/A Measurement:Bedside RN to measure today. I have communicated with Bedside RN, M. McIver. Wound bed:Red, dry Drainage (amount, consistency, odor) scant serous Periwound:erythematous, dry Dressing procedure/placement/frequency: I have provided conservative guidance for the care of the patient's LEs using a soap and water cleanse, rinse and pat dry, careful drying between digits. The LEs will be moisturized with Eucerin cream. The open areas are to be covered with antimicrobial nonadherent gauze (xeroform), covered and secured with a few turns of Kerlix roll gauze. Twice daily changes are ordered. Floatation of the heels is recommended as is a sacral bordered form dressing to prevent pressure injury while in the ED and later, if admitted.  [facility] nursing team will not follow, but will remain available to this patient, the nursing and medical teams.  Please re-consult if needed. Thanks, Maudie Flakes, MSN, RN, [place], Arther Abbott  Pager# [phone]

## 2021-03-15 NOTE — Progress Notes (Signed)
PROGRESS NOTE    [name]  [id number] DOB: 01/03/1956 DOA: 03/14/2021 PCP: Rusty Aus, MD    Brief Narrative:  Madison Mcbride is a 65 y.o. female with history of diastolic CHF, morbid obesity, hypothyroidism, type 2 diabetes, OSA, lymphedema, CKD, who presents with shortness of breath.[id number] with vascular congestion and mild edema   9/3 sounds congested mildly, and c/o cough asking for cough suppressant   Consultants:    Procedures:   Antimicrobials:      Subjective: No cp, dizziness, or lightehadedness  Objective: Vitals:   03/15/21 1500 03/15/21 1530 03/15/21 1630 03/15/21 1735  BP: (!) 129/96 136/80 134/80 125/80  Pulse: (!) 55 (!) 56 (!) 54 (!) 54  Resp: '15 15 16 18  '$ Temp:    98.2 F (36.8 C)  TempSrc:    Oral  SpO2: 95% 95% 90% 98%  Weight:      Height:        Intake/Output Summary (Last 24 hours) at 03/15/2021 1746 Last data filed at 03/15/2021 [id number] Gross per 24 hour  Intake --  Output 1500 ml  Net -1500 ml   Filed Weights   03/14/21 1805  Weight: (!) 151.5 kg    Examination:  General exam: Appears calm and comfortable  Respiratory system: decrease bs, no wheezing Cardiovascular system: S1 & S2 heard, RRR. No JVD, murmurs, rubs, gallops or clicks.  Gastrointestinal system: Abdomen is nondistended, soft and nontender.Normal bowel sounds heard. Central nervous system: Alert and oriented. Grossly intact Extremities: +Edema b/l with warmth/erythema chronic. Psychiatry: Judgement and insight appear normal. Mood & affect appropriate.     Data Reviewed: I have personally reviewed following labs and imaging studies  CBC: Recent Labs  Lab 03/14/21 1817 03/15/21 0527  WBC 11.2* 10.6*  NEUTROABS 8.4*  --   HGB 13.2 12.4  HCT 45.8 43.8  MCV 83.4 83.9  PLT 429* [id number]   Basic Metabolic Panel: Recent Labs  Lab 03/14/21 1817 03/15/21 0527  NA 140 142  K 4.2 4.2  CL 100 103  CO2 29 32  GLUCOSE 92 87  BUN 34*  34*  CREATININE 1.27* 1.34*  CALCIUM 8.4* 8.1*   GFR: Estimated Creatinine Clearance: 60.9 mL/min (A) (by C-G formula based on SCr of 1.34 mg/dL (H)). Liver Function Tests: Recent Labs  Lab 03/14/21 1817  AST 25  ALT 13  ALKPHOS 103  BILITOT 2.0*  PROT 7.4  ALBUMIN 3.1*   No results for input(s): LIPASE, AMYLASE in the last 168 hours. No results for input(s): AMMONIA in the last 168 hours. Coagulation Profile: No results for input(s): INR, PROTIME in the last 168 hours. Cardiac Enzymes: No results for input(s): CKTOTAL, CKMB, CKMBINDEX, TROPONINI in the last 168 hours. BNP (last 3 results) No results for input(s): PROBNP in the last 8760 hours. HbA1C: Recent Labs    03/15/21 0527  HGBA1C 6.3*   CBG: Recent Labs  Lab 03/15/21 0750 03/15/21 1150  GLUCAP 79 93   Lipid Profile: No results for input(s): CHOL, HDL, LDLCALC, TRIG, CHOLHDL, LDLDIRECT in the last 72 hours. Thyroid Function Tests: No results for input(s): TSH, T4TOTAL, FREET4, T3FREE, THYROIDAB in the last 72 hours. Anemia Panel: No results for input(s): VITAMINB12, FOLATE, FERRITIN, TIBC, IRON, RETICCTPCT in the last 72 hours. Sepsis Labs: Recent Labs  Lab 03/14/21 1811  LATICACIDVEN 2.5*    Recent Results (from the past 240 hour(s))  Resp Panel by RT-PCR (Flu A&B, Covid)     Status: None   Collection  Time: 03/14/21  8:21 PM   Specimen: Nasopharyngeal(NP) swabs in vial transport medium  Result Value Ref Range Status   SARS Coronavirus 2 by RT PCR NEGATIVE NEGATIVE Final    Comment: (NOTE) SARS-CoV-2 target nucleic acids are NOT DETECTED.  The SARS-CoV-2 RNA is generally detectable in upper respiratory specimens during the acute phase of infection. The lowest concentration of SARS-CoV-2 viral copies this assay can detect is 138 copies/mL. A negative result does not preclude SARS-Cov-2 infection and should not be used as the sole basis for treatment or other patient management decisions. A  negative result may occur with  improper specimen collection/handling, submission of specimen other than nasopharyngeal swab, presence of viral mutation(s) within the areas targeted by this assay, and inadequate number of viral copies(<138 copies/mL). A negative result must be combined with clinical observations, patient history, and epidemiological information. The expected result is Negative.  Fact Sheet for Patients:  EntrepreneurPulse.com.au  Fact Sheet for Healthcare Providers:  IncredibleEmployment.be  This test is no t yet approved or cleared by the [place] FDA and  has been authorized for detection and/or diagnosis of SARS-CoV-2 by FDA under an Emergency Use Authorization (EUA). This EUA will remain  in effect (meaning this test can be used) for the duration of the COVID-19 declaration under Section 564(b)(1) of the Act, 21 U.S.C.section 360bbb-3(b)(1), unless the authorization is terminated  or revoked sooner.       Influenza A by PCR NEGATIVE NEGATIVE Final   Influenza B by PCR NEGATIVE NEGATIVE Final    Comment: (NOTE) The Xpert Xpress SARS-CoV-2/FLU/RSV plus assay is intended as an aid in the diagnosis of influenza from Nasopharyngeal swab specimens and should not be used as a sole basis for treatment. Nasal washings and aspirates are unacceptable for Xpert Xpress SARS-CoV-2/FLU/RSV testing.  Fact Sheet for Patients: EntrepreneurPulse.com.au  Fact Sheet for Healthcare Providers: IncredibleEmployment.be  This test is not yet approved or cleared by the [place] FDA and has been authorized for detection and/or diagnosis of SARS-CoV-2 by FDA under an Emergency Use Authorization (EUA). This EUA will remain in effect (meaning this test can be used) for the duration of the COVID-19 declaration under Section 564(b)(1) of the Act, 21 U.S.C. section 360bbb-3(b)(1), unless the authorization  is terminated or revoked.  Performed at [facility], [address], [place], [place] [address]          Radiology Studies: DG Chest Portable 1 View  Result Date: 03/14/2021 CLINICAL DATA:  Hypoxia EXAM: PORTABLE CHEST 1 VIEW COMPARISON:  04/17/2020 FINDINGS: Cardiomegaly with vascular congestion and probable mild edema. No pleural effusion or pneumothorax. IMPRESSION: Cardiomegaly with vascular congestion and mild edema Electronically Signed   By: Donavan Foil M.D.   On: 03/14/2021 19:30        Scheduled Meds:  aspirin EC  81 mg Oral Daily   atorvastatin  20 mg Oral Q2200   buPROPion  150 mg Oral Daily   enoxaparin (LOVENOX) injection  0.5 mg/kg Subcutaneous Q24H   furosemide  20 mg Intravenous BID   guaiFENesin  600 mg Oral BID   hydrocerin   Topical BID   insulin aspart  0-9 Units Subcutaneous TID WC   levothyroxine  250 mcg Oral Q0600   potassium chloride  40 mEq Oral BID   rOPINIRole  3 mg Oral TID   sodium chloride flush  3 mL Intravenous Q12H   Continuous Infusions:  Assessment & Plan:   Principal Problem:   Acute on chronic  diastolic CHF (congestive heart failure) (HCC) Active Problems:   Type 2 diabetes mellitus with diabetic neuropathy, without long-term current use of insulin (HCC)   Hypothyroidism   Morbid obesity with BMI of 50.0-59.9, adult (HCC)   OSA (obstructive sleep apnea)   Acute hypoxemic respiratory failure (HCC)   #Acute on chronic diastolic CHF #Acute hypoxemic respiratory failure Presenting with signs and symptoms of acute CHF exacerbation, etiology not clear.  Patient seems to be unaware of diagnosis. 9/3-we will obtain echocardiogram Creatinine mildly up with Lasix will decrease dose to 20 mg IV twice daily BNP on admission was elevated Strict I's and O's Daily weight Keep O2 sat above 92%    #Elevated troponin Minimal elevation.  Likely demand ischemia due to above  CAD-continue aspirin and  atorvastatin Depression-ccontinue bupropion Restless leg syndrome-continue ropinirole Hypothyroidism-continue Synthroid DM2-hold home Ozempic 9/3 BG stable Continue ISS     DVT prophylaxis: Lovenox Code Status: Full Family Communication: None at bedside Disposition Plan:  Status is: Inpatient  Remains inpatient appropriate because:IV treatments appropriate due to intensity of illness or inability to take PO  Dispo: The patient is from: Home              Anticipated d/c is to: Home              Patient currently is not medically stable to d/c.   Difficult to place patient No            LOS: 1 day   Time spent: 35 minutes with more than 50% on [place], MD [facility] Pager [phone]  If 7PM-7AM, please contact night-coverage 03/15/2021, 5:46 PM

## 2021-03-15 NOTE — ED Notes (Signed)
Pt given pillow and box of kleenex upon request

## 2021-03-15 NOTE — ED Notes (Signed)
Apologized to pt and husband for delay on transport. Very understanding

## 2021-03-15 NOTE — ED Notes (Signed)
Provided pt with portable phone to call husband

## 2021-03-15 NOTE — ED Notes (Signed)
Informed RN bed assigned 

## 2021-03-15 NOTE — ED Notes (Signed)
Pt states she will hit call light and let me know when she is ready to do wound care. All supplies are at bedside.

## 2021-03-16 ENCOUNTER — Inpatient Hospital Stay
Admit: 2021-03-16 | Discharge: 2021-03-16 | Disposition: A | Discharge status: Home / Self Care | Payer: Medicare Other | Attending: Family Medicine | Admitting: Family Medicine

## 2021-03-16 DIAGNOSIS — Z6841 Body Mass Index (BMI) 40.0 and over, adult: Secondary | ICD-10-CM

## 2021-03-16 LAB — BASIC METABOLIC PANEL
Anion gap: 7 (ref 5–15)
BUN: 35 mg/dL — ABNORMAL HIGH (ref 8–23)
CO2: 34 mmol/L — ABNORMAL HIGH (ref 22–32)
Calcium: 8.3 mg/dL — ABNORMAL LOW (ref 8.9–10.3)
Chloride: 101 mmol/L (ref 98–111)
Creatinine, Ser: 1.46 mg/dL — ABNORMAL HIGH (ref 0.44–1.00)
GFR, Estimated: 39 mL/min — ABNORMAL LOW (ref >60)
Glucose, Bld: 90 mg/dL (ref 70–99)
Potassium: 4.7 mmol/L (ref 3.5–5.1)
Sodium: 142 mmol/L (ref 135–145)

## 2021-03-16 LAB — BRAIN NATRIURETIC PEPTIDE: B Natriuretic Peptide: 1352 pg/mL — ABNORMAL HIGH (ref 0.0–100.0)

## 2021-03-16 LAB — GLUCOSE, CAPILLARY
Glucose-Capillary: 90 mg/dL (ref 70–99)
Glucose-Capillary: 111 mg/dL — ABNORMAL HIGH (ref 70–99)
Glucose-Capillary: 115 mg/dL — ABNORMAL HIGH (ref 70–99)
Glucose-Capillary: 123 mg/dL — ABNORMAL HIGH (ref 70–99)

## 2021-03-16 MED ORDER — GABAPENTIN 300 MG PO CAPS
300.0000 mg | ORAL_CAPSULE | Freq: Once | ORAL | Status: AC
  Administered 2021-03-16: 300 mg via ORAL
  Filled 2021-03-16: qty 1

## 2021-03-16 MED ORDER — PERFLUTREN LIPID MICROSPHERE
1.0000 mL | INTRAVENOUS | Status: AC | PRN
Start: 2021-03-16 — End: 2021-03-16
  Administered 2021-03-16: 4 mL via INTRAVENOUS
  Filled 2021-03-16: qty 10

## 2021-03-16 MED ORDER — FUROSEMIDE 10 MG/ML IJ SOLN
5.0000 mg/h | INTRAVENOUS | Status: DC
  Administered 2021-03-16 – 2021-03-18 (×2): 5 mg/h via INTRAVENOUS
  Filled 2021-03-16 (×2): qty 20

## 2021-03-16 NOTE — Progress Notes (Signed)
*  PRELIMINARY RESULTS* Echocardiogram 2D Echocardiogram has been performed. Definity IV Contrast used on this study.  Madison Mcbride 03/16/2021, 5:16 PM

## 2021-03-16 NOTE — Evaluation (Signed)
Physical Therapy Evaluation Patient Details Name: Madison Mcbride MRN: [id number] DOB: 1956-02-07 Today's Date: 03/16/2021   History of Present Illness  Pt admitted to [facility] on 03/14/21 for c/o SOB secondary to acute on chronic diastolic CHF exacerbation. Hypoxia at 81% improved with supplemental O2. Elevated troponin likely secondary to volume overload. Significant PMH includes: diastolic CHF, morbid obesity, hypothyroidism, T2DM, OSA, lymphedema, CKD. CXR significant for cardiomegaly with vascular congestion and mild edema.   Clinical Impression  Pt is a 65 year old F admitted to hospital on 03/14/21 for acute on chronic diastolic CHF. At baseline, pt requires assist for intermittent transfers and IADL's; otherwise she states that she's grossly mod I for ADL's, limited household gait with RW, and light cooking (soups, sandwiches, etc). Pt presents with generalized weakness, decreased activity tolerance, increased O2 dependence from baseline, decreased standing balance, impaired skin integrity, +1 non-pitting edema of BLE with trophic skin changes, obesity, and limited RUE shoulder AROM (chronic) resulting in impaired functional mobility. Due to deficits, pt required mod assist for bed mobility, mod-max assist for transfers, and CGA for safety for limited gait with RW. Increased time/effort required throughout session due to decreased activity tolerance with toileting, transfers, and gait. Increased UE reliance on RW and momentum required for all functional mobility. Pt states that she is near her baseline with exception of activity tolerance and O2 needs, and that she has an aide that comes to assist daily. SpO2 maintained >/= 91% throughout session, however, unable to obtain accurate reading during gait. Labored breathing noted with all mobility. Per MD orders, SpO2 >/= 92% on supplemental O2; pt able to recover quickly with seated rest break and cues for pursed lip breathing. Deficits limit the pt's  ability to safely and independently perform ADL's, transfer, and ambulate. Pt will benefit from acute skilled PT services to address deficits for return to baseline function. At this time, PT recommends SNF to address deficits and improve overall safety with functional mobility prior to return home.     Follow Up Recommendations SNF;Supervision for mobility/OOB    Equipment Recommendations  None recommended by PT       Precautions / Restrictions Precautions Precautions: Fall Restrictions Weight Bearing Restrictions: No Other Position/Activity Restrictions: Strict I/O      Mobility  Bed Mobility Overal bed mobility: Needs Assistance Bed Mobility: Supine to Sit     Supine to sit: Mod assist;HOB elevated     General bed mobility comments: Mod assist for trunk facilitation to sit EOB with HOB elevated; increased reliance on UE support on bedrail with momentum. Verbal cues for hand placement and sequencing.    Transfers Overall transfer level: Needs assistance Equipment used: Rolling walker (2 wheeled) Transfers: Sit to/from Stand Sit to Stand: Mod assist;Max assist         General transfer comment: Mod assist for power to stand from EOB; max assist for power to stand from low height commode. Performed with RW. Verbal cues for hand placement and sequencing. Increased reliance on UE support and momentum to facilitate mobility.  Ambulation/Gait Ambulation/Gait assistance: Min guard Gait Distance (Feet): 12 Feet (86f x1 (EOB>bathroom), 242f(bathoom>recliner)) Assistive device: Rolling walker (2 wheeled)       General Gait Details: CGA for safety to ambulate in room with RW. Pt demonstrates decreased RW proximity, decreased step length/foot clearance bil, slowed cadence, bil ER, labored breathing, increased pelvic rotation with LE facilitation, and forward flexed posture.     Balance Overall balance assessment: Needs assistance Sitting-balance support:  Single extremity  supported;Feet supported Sitting balance-Leahy Scale: Good     Standing balance support: Bilateral upper extremity supported;During functional activity Standing balance-Leahy Scale: Fair Standing balance comment: BUE support in RW, increased M/L sway and slowed cadence          Pertinent Vitals/Pain Pain Assessment: No/denies pain    Home Living Family/patient expects to be discharged to:: Private residence Living Arrangements: Spouse/significant other Available Help at Discharge: Family;Friend(s);Available 24 hours/day;Other (Comment) Type of Home: House Home Access: Stairs to enter Entrance Stairs-Rails: Right;Left;Can reach both Entrance Stairs-Number of Steps: 5 Home Layout: One level Home Equipment: Walker - 4 wheels;Shower seat;Grab bars - toilet;Grab bars - tub/shower;Walker - 2 wheels;Bedside commode;Wheelchair - manual Additional Comments: lift chair    Prior Function Level of Independence: Needs assistance   Gait / Transfers Assistance Needed: Pt reports needing intermittent physical assist for transfers from low seat height. Uses RW for short household distances and manual WC for community mobility. Pt sleeps in lift chair.  ADL's / Homemaking Assistance Needed: Pt reports being Ind with ADL's and light cooking. Otherwise pt requires assist for cleaning, laundry, and grocery shopping. Pt states that she normally wears slip on shoes at home.  Comments: Pt has aide that comes by daily; pt reports that aide stays "as long as she's needed" to assist with IADL's, transfers, etc. while spouse is at work. Pt states that she was driving prior to hospital admission.     Hand Dominance        Extremity/Trunk Assessment   Upper Extremity Assessment Upper Extremity Assessment: Generalized weakness;RUE deficits/detail (L shoulder flexion 3+/5, elbow flex/ext 4/5; sensation intact) RUE Deficits / Details: Limited shoulder flexion to ~75deg which pt reports is due to arthritis;  PROM WNL. Shoulder flexion 2-/5, elbow flex/ext 4/5. RUE Sensation: WNL    Lower Extremity Assessment Lower Extremity Assessment: Generalized weakness (hip flexion 3-/5, otherwise grossly 4+/5; sensation intact; coordination intact. BLE resting in ER secondary to pt habitus.)       Communication   Communication: No difficulties  Cognition Arousal/Alertness: Awake/alert Behavior During Therapy: WFL for tasks assessed/performed Overall Cognitive Status: Within Functional Limits for tasks assessed          General Comments: A&O x4 and able to follow 100% of 2 step commands      General Comments General comments (skin integrity, edema, etc.): RLE wrapped in kerlix from wound care; LLE with erythema and trophic skin changes; +1 non-pitting edema bil    Exercises Other Exercises Other Exercises: Able to perform bed mobility, transfers, and limited gait with RW. Increased assist and time/effort for all mobility. SpO2 >/= 91% on 4L throughout session; unable to get accurate reading during gait due to increased UE WB in RW, labored breathing. Other Exercises: Pt educated regarding: PT role/POC, DC recommendations, bariatric RW/BSC, increased mobility while hospitalized (to/from commode), and BLE elevation for edema management.   Assessment/Plan    PT Assessment Patient needs continued PT services  PT Problem List Decreased strength;Decreased range of motion;Decreased activity tolerance;Decreased balance;Decreased mobility;Cardiopulmonary status limiting activity;Obesity;Decreased skin integrity       PT Treatment Interventions Gait training;Stair training;Functional mobility training;Therapeutic activities;Therapeutic exercise;Balance training;Neuromuscular re-education    PT Goals (Current goals can be found in the Care Plan section)  Acute Rehab PT Goals Patient Stated Goal: "go home" PT Goal Formulation: With patient Time For Goal Achievement: 03/30/21 Potential to Achieve Goals:  Fair    Frequency Min 2X/week    AM-PAC PT "6 Clicks" Mobility  Outcome Measure Help needed turning from your back to your side while in a flat bed without using bedrails?: A Lot Help needed moving from lying on your back to sitting on the side of a flat bed without using bedrails?: A Lot Help needed moving to and from a bed to a chair (including a wheelchair)?: A Little Help needed standing up from a chair using your arms (e.g., wheelchair or bedside chair)?: A Lot Help needed to walk in hospital room?: A Little Help needed climbing 3-5 steps with a railing? : A Lot 6 Click Score: 14    End of Session Equipment Utilized During Treatment: Gait belt;Oxygen (4LPM) Activity Tolerance: Patient tolerated treatment well;Patient limited by fatigue Patient left: in chair;with call bell/phone within reach;with chair alarm set (BLE elevated on pillow, purewick donned) Nurse Communication: Mobility status (SpO2) PT Visit Diagnosis: Unsteadiness on feet (R26.81);Muscle weakness (generalized) (M62.81);Difficulty in walking, not elsewhere classified (R26.2)    Time: [id number] PT Time Calculation (min) (ACUTE ONLY): 61 min   Charges:   PT Evaluation $PT Eval Moderate Complexity: 1 Mod PT Treatments $Therapeutic Activity: 38-52 mins       Herminio Commons, PT, DPT 12:26 PM,03/16/21

## 2021-03-16 NOTE — Progress Notes (Addendum)
PROGRESS NOTE    Madison Mcbride  [id number] DOB: 08-16-1955 DOA: 03/14/2021 PCP: Rusty Aus, MD    Brief Narrative:  Madison Mcbride is a 65 y.o. female with history of diastolic CHF, morbid obesity, hypothyroidism, type 2 diabetes, OSA, lymphedema, CKD, who presents with shortness of breath.[id number] with vascular congestion and mild edema   9/3 sounds congested mildly, and c/o cough asking for cough suppressant 9/4 no overnight issues.  Consultants:    Procedures:   Antimicrobials:      Subjective: Has doe , but sob improving. No cp   Objective: Vitals:   03/16/21 0740 03/16/21 1123 03/16/21 1202 03/16/21 1521  BP: 132/78 125/67  138/77  Pulse: (!) 54 (!) 56 79 (!) 50  Resp: '19 19  18  '$ Temp: 98.6 F (37 C) 97.9 F (36.6 C)  98.3 F (36.8 C)  TempSrc:      SpO2: 98% 98% 97% 96%  Weight:      Height:        Intake/Output Summary (Last 24 hours) at 03/16/2021 1904 Last data filed at 03/16/2021 1515 Gross per 24 hour  Intake 720 ml  Output 700 ml  Net 20 ml   Filed Weights   03/14/21 1805 03/16/21 0500  Weight: (!) 151.5 kg (!) 150.2 kg    Examination:  Nad, sitting in chair More cta , no wheezing Regular s1/s2 nogallop Soft benign +bs  +edema, improved some     Data Reviewed: I have personally reviewed following labs and imaging studies  CBC: Recent Labs  Lab 03/14/21 1817 03/15/21 0527  WBC 11.2* 10.6*  NEUTROABS 8.4*  --   HGB 13.2 12.4  HCT 45.8 43.8  MCV 83.4 83.9  PLT 429* [id number]   Basic Metabolic Panel: Recent Labs  Lab 03/14/21 1817 03/15/21 0527 03/16/21 0410  NA 140 142 142  K 4.2 4.2 4.7  CL 100 103 101  CO2 29 32 34*  GLUCOSE 92 87 90  BUN 34* 34* 35*  CREATININE 1.27* 1.34* 1.46*  CALCIUM 8.4* 8.1* 8.3*   GFR: Estimated Creatinine Clearance: 55.6 mL/min (A) (by C-G formula based on SCr of 1.46 mg/dL (H)). Liver Function Tests: Recent Labs  Lab 03/14/21 1817  AST 25  ALT 13  ALKPHOS 103   BILITOT 2.0*  PROT 7.4  ALBUMIN 3.1*   No results for input(s): LIPASE, AMYLASE in the last 168 hours. No results for input(s): AMMONIA in the last 168 hours. Coagulation Profile: No results for input(s): INR, PROTIME in the last 168 hours. Cardiac Enzymes: No results for input(s): CKTOTAL, CKMB, CKMBINDEX, TROPONINI in the last 168 hours. BNP (last 3 results) No results for input(s): PROBNP in the last 8760 hours. HbA1C: Recent Labs    03/15/21 0527  HGBA1C 6.3*   CBG: Recent Labs  Lab 03/15/21 1736 03/15/21 2042 03/16/21 0742 03/16/21 1123 03/16/21 1609  GLUCAP 100* 97 90 111* 115*   Lipid Profile: No results for input(s): CHOL, HDL, LDLCALC, TRIG, CHOLHDL, LDLDIRECT in the last 72 hours. Thyroid Function Tests: No results for input(s): TSH, T4TOTAL, FREET4, T3FREE, THYROIDAB in the last 72 hours. Anemia Panel: No results for input(s): VITAMINB12, FOLATE, FERRITIN, TIBC, IRON, RETICCTPCT in the last 72 hours. Sepsis Labs: Recent Labs  Lab 03/14/21 1811  LATICACIDVEN 2.5*    Recent Results (from the past 240 hour(s))  Resp Panel by RT-PCR (Flu A&B, Covid)     Status: None   Collection Time: 03/14/21  8:21 PM   Specimen:  Nasopharyngeal(NP) swabs in vial transport medium  Result Value Ref Range Status   SARS Coronavirus 2 by RT PCR NEGATIVE NEGATIVE Final    Comment: (NOTE) SARS-CoV-2 target nucleic acids are NOT DETECTED.  The SARS-CoV-2 RNA is generally detectable in upper respiratory specimens during the acute phase of infection. The lowest concentration of SARS-CoV-2 viral copies this assay can detect is 138 copies/mL. A negative result does not preclude SARS-Cov-2 infection and should not be used as the sole basis for treatment or other patient management decisions. A negative result may occur with  improper specimen collection/handling, submission of specimen other than nasopharyngeal swab, presence of viral mutation(s) within the areas targeted by  this assay, and inadequate number of viral copies(<138 copies/mL). A negative result must be combined with clinical observations, patient history, and epidemiological information. The expected result is Negative.  Fact Sheet for Patients:  EntrepreneurPulse.com.au  Fact Sheet for Healthcare Providers:  IncredibleEmployment.be  This test is no t yet approved or cleared by the [place] FDA and  has been authorized for detection and/or diagnosis of SARS-CoV-2 by FDA under an Emergency Use Authorization (EUA). This EUA will remain  in effect (meaning this test can be used) for the duration of the COVID-19 declaration under Section 564(b)(1) of the Act, 21 U.S.C.section 360bbb-3(b)(1), unless the authorization is terminated  or revoked sooner.       Influenza A by PCR NEGATIVE NEGATIVE Final   Influenza B by PCR NEGATIVE NEGATIVE Final    Comment: (NOTE) The Xpert Xpress SARS-CoV-2/FLU/RSV plus assay is intended as an aid in the diagnosis of influenza from Nasopharyngeal swab specimens and should not be used as a sole basis for treatment. Nasal washings and aspirates are unacceptable for Xpert Xpress SARS-CoV-2/FLU/RSV testing.  Fact Sheet for Patients: EntrepreneurPulse.com.au  Fact Sheet for Healthcare Providers: IncredibleEmployment.be  This test is not yet approved or cleared by the [place] FDA and has been authorized for detection and/or diagnosis of SARS-CoV-2 by FDA under an Emergency Use Authorization (EUA). This EUA will remain in effect (meaning this test can be used) for the duration of the COVID-19 declaration under Section 564(b)(1) of the Act, 21 U.S.C. section 360bbb-3(b)(1), unless the authorization is terminated or revoked.  Performed at [facility], [address] [place], [place] [address]          Radiology Studies: No results found.      Scheduled  Meds:  aspirin EC  81 mg Oral Daily   atorvastatin  20 mg Oral Q2200   buPROPion  150 mg Oral Daily   enoxaparin (LOVENOX) injection  0.5 mg/kg Subcutaneous Q24H   guaiFENesin  600 mg Oral BID   hydrocerin   Topical BID   insulin aspart  0-9 Units Subcutaneous TID WC   levothyroxine  250 mcg Oral Q0600   rOPINIRole  3 mg Oral TID   sodium chloride flush  3 mL Intravenous Q12H   Continuous Infusions:  furosemide (LASIX) 200 mg in dextrose 5% 100 mL ('2mg'$ /mL) infusion 5 mg/hr (03/16/21 1515)    Assessment & Plan:   Principal Problem:   Acute on chronic diastolic CHF (congestive heart failure) (HCC) Active Problems:   Type 2 diabetes mellitus with diabetic neuropathy, without long-term current use of insulin (HCC)   Hypothyroidism   Morbid obesity with BMI of 50.0-59.9, adult (HCC)   OSA (obstructive sleep apnea)   Acute hypoxemic respiratory failure (HCC)   #Acute on chronic diastolic CHF #Acute hypoxemic respiratory failure Presenting with  signs and symptoms of acute CHF exacerbation, etiology not clear.  Patient seems to be unaware of diagnosis. 9/3-we will obtain echocardiogram Creatinine mildly up with Lasix will decrease dose to 20 mg IV twice daily BNP on admission was elevated Strict I's and O's Daily weight Keep O2 sat above 92% 9/4- echo pending Bnp still elevated Still vol. Overloaded Will change to lasix gtt and monitor renal function. D/w pt about   worsening of renal function as we are trying to diurese her .    #Elevated troponin Minimal elevation.  Likely demand ischemia due to above 9/4 echo pending  AKI- renal function mildly down with diuresis. Still congested  Consulted nephrology, rec. Lasix gtt and will see pt Monitor renal function  CAD-continue aspirin and atorvastatin Depression-ccontinue bupropion Restless leg syndrome-continue ropinirole Hypothyroidism-continue Synthroid  DM2-hold home Ozempic 9/3 BG stable Continue ISS     DVT  prophylaxis: Lovenox Code Status: Full Family Communication: husband at bedside Disposition Plan:  Status is: Inpatient  Remains inpatient appropriate because:IV treatments appropriate due to intensity of illness or inability to take PO  Dispo: The patient is from: Home              Anticipated d/c is to: Home              Patient currently is not medically stable to d/c.   Difficult to place patient No            LOS: 2 days   Time spent: 35 minutes with more than 50% on [place], MD [facility] Pager [phone]  If 7PM-7AM, please contact night-coverage 03/16/2021, 7:04 PM

## 2021-03-17 LAB — ECHOCARDIOGRAM COMPLETE
AR max vel: 2.38 cm2
AV Peak grad: 7 mmHg
Ao pk vel: 1.32 m/s
Area-P 1/2: 2.67 cm2
Height: 64 in
P 1/2 time: 500 msec
S' Lateral: 4.7 cm
Weight: 5298.09 oz

## 2021-03-17 LAB — GLUCOSE, CAPILLARY
Glucose-Capillary: 106 mg/dL — ABNORMAL HIGH (ref 70–99)
Glucose-Capillary: 134 mg/dL — ABNORMAL HIGH (ref 70–99)
Glucose-Capillary: 99 mg/dL (ref 70–99)
Glucose-Capillary: 117 mg/dL — ABNORMAL HIGH (ref 70–99)

## 2021-03-17 LAB — BASIC METABOLIC PANEL
Anion gap: 6 (ref 5–15)
BUN: 29 mg/dL — ABNORMAL HIGH (ref 8–23)
CO2: 39 mmol/L — ABNORMAL HIGH (ref 22–32)
Calcium: 8.3 mg/dL — ABNORMAL LOW (ref 8.9–10.3)
Chloride: 95 mmol/L — ABNORMAL LOW (ref 98–111)
Creatinine, Ser: 1.16 mg/dL — ABNORMAL HIGH (ref 0.44–1.00)
GFR, Estimated: 52 mL/min — ABNORMAL LOW (ref >60)
Glucose, Bld: 84 mg/dL (ref 70–99)
Potassium: 4.2 mmol/L (ref 3.5–5.1)
Sodium: 140 mmol/L (ref 135–145)

## 2021-03-17 MED ORDER — GABAPENTIN 600 MG PO TABS
300.0000 mg | ORAL_TABLET | Freq: Two times a day (BID) | ORAL | Status: DC
  Administered 2021-03-17 – 2021-03-20 (×7): 300 mg via ORAL
  Filled 2021-03-17 (×7): qty 1

## 2021-03-17 NOTE — Consult Note (Signed)
[facility] CONSULT NOTE    Date: 03/17/2021                  Patient Name:  Madison Mcbride  MRN: [id number]  DOB: [birth year]  Age / Sex: 65 y.o., female         PCP: Rusty Aus, MD                 Service Requesting Consult: Hospitalist                 Reason for Consult: Acute kidney injury/chronic kidney disease stage IIIb            History of Present Illness: Patient is a 65 y.o. female with a PMHx of chronic diastolic heart failure, morbid obesity, hypothyroidism, diabetes mellitus type 2, obstructive sleep apnea, lymphedema, chronic kidney disease stage IIIa baseline EGFR 47, who was admitted to [facility] on 03/14/2021 for evaluation of shortness of breath and hypoxia.  She has known diastolic heart failure.  When she first presented to the hospital her O2 sat was 81% which subsequently improved with application of 3 L of nasal cannula.  BNP was found to be elevated.  Chest x-ray revealed cardiomegaly and pulmonary edema.  She is started on IV diuretics.  We were contacted by hospitalist yesterday.  We advised him to transition the patient to Lasix drip.  Since then she has had rather good urine output.  In fact renal function has improved as BUN down to 29 with a creatinine of 1.16.  She states that her respiratory status has improved.  Her baseline creatinine previously was 1.2 with an EGFR of 47.   Medications: Outpatient medications: Medications Prior to Admission  Medication Sig Dispense Refill Last Dose   atorvastatin (LIPITOR) 20 MG tablet Take 20 mg by mouth daily.   03/13/2021   Biotin 2.5 MG CAPS Take 2.5 mg by mouth daily.    03/14/2021 at 1000   buPROPion (WELLBUTRIN XL) 150 MG 24 hr tablet Take 150 mg by mouth daily.   03/14/2021 at 1000   furosemide (LASIX) 40 MG tablet Take 40 mg by mouth daily.   03/14/2021   gabapentin (NEURONTIN) 300 MG capsule Take 300 mg by mouth See admin instructions. TAKE 2 CAPSULES IN THE MORNING, 1 CAPSULE IN THE  AFTERNOON, AND 2 CAPSULES AT NIGHT   03/14/2021   levothyroxine (SYNTHROID) 125 MCG tablet Take 125 mcg by mouth daily.   03/14/2021 at 1000   Multiple Vitamin (MULTIVITAMIN) tablet Take 1 tablet by mouth daily.   03/14/2021 at 1000   pantoprazole (PROTONIX) 40 MG tablet Take 40 mg by mouth daily.   03/14/2021 at 1000   propranolol (INDERAL) 20 MG tablet Take 20 mg by mouth 3 (three) times daily.   03/14/2021 at 1000   rOPINIRole (REQUIP) 3 MG tablet Take 3 mg by mouth 3 (three) times daily.  3 03/14/2021 at 1000   Semaglutide, 1 MG/DOSE, 2 MG/1.5ML SOPN Inject 0.75 mLs into the skin once a week.   Past Week   zinc gluconate 50 MG tablet Take 50 mg by mouth daily.   03/14/2021   acetaminophen (TYLENOL) 500 MG tablet Take 1,000 mg by mouth every 8 (eight) hours. (Patient not taking: Reported on 03/15/2021)   Not Taking   aspirin EC 81 MG EC tablet Take 1 tablet (81 mg total) by mouth daily. Swallow whole. (Patient not taking: Reported on 03/15/2021) 30 tablet 11 Not Taking  atorvastatin (LIPITOR) 20 MG tablet Take 20 mg by mouth daily at 10 pm.      Cholecalciferol (D3-1000) 25 MCG (1000 UT) tablet Take 1,000 Units by mouth daily. (Patient not taking: Reported on 03/15/2021)   Not Taking   dextromethorphan-guaiFENesin (MUCINEX DM) 30-600 MG 12hr tablet Take 1 tablet by mouth 2 (two) times daily as needed for cough. (Patient not taking: Reported on 03/15/2021)   Not Taking   gabapentin (NEURONTIN) 100 MG capsule Take 2 capsules (200 mg total) by mouth 3 (three) times daily. (Patient taking differently: Take 300 mg by mouth 3 (three) times daily. ) 180 capsule 0    HYDROcodone bit-homatropine (HYCODAN) 5-1.5 MG/5ML syrup Take 5 mLs by mouth 2 (two) times daily as needed for cough. (Patient not taking: Reported on 03/15/2021)   Not Taking   methocarbamol (ROBAXIN) 500 MG tablet Take 1 tablet (500 mg total) by mouth every 8 (eight) hours as needed for muscle spasms. (Patient not taking: No sig reported) 15 tablet 0 Completed  Course   midodrine (PROAMATINE) 10 MG tablet Take 1 tablet (10 mg total) by mouth 3 (three) times daily with meals as needed (hypotension SBP <100). (Patient not taking: Reported on 03/15/2021)   Not Taking   montelukast (SINGULAIR) 10 MG tablet Take 10 mg by mouth daily. (Patient not taking: Reported on 03/15/2021)   Not Taking   nystatin (MYCOSTATIN/NYSTOP) powder Apply topically 3 (three) times daily. (Patient not taking: No sig reported) 15 g 0 Not Taking   OZEMPIC, 0.25 OR 0.5 MG/DOSE, 2 MG/1.5ML SOPN Inject 0.375 mLs into the skin every Saturday.  (Patient not taking: Reported on 03/15/2021)   Not Taking   potassium chloride SA (KLOR-CON) 20 MEQ tablet Take 1 tablet (20 mEq total) by mouth daily. (Patient not taking: Reported on 04/09/2020) 30 tablet 0     Current medications: Current Facility-Administered Medications  Medication Dose Route Frequency Provider Last Rate Last Admin   acetaminophen (TYLENOL) tablet 650 mg  650 mg Oral Q6H PRN Clarnce Flock, MD   650 mg at 03/17/21 1050   Or   acetaminophen (TYLENOL) suppository 650 mg  650 mg Rectal Q6H PRN Clarnce Flock, MD       aspirin EC tablet 81 mg  81 mg Oral Daily Clarnce Flock, MD   81 mg at 03/17/21 1044   aspirin-acetaminophen-caffeine (EXCEDRIN MIGRAINE) per tablet 1 tablet  1 tablet Oral Q6H PRN Nolberto Hanlon, MD   1 tablet at 03/15/21 1345   atorvastatin (LIPITOR) tablet 20 mg  20 mg Oral Q2200 Clarnce Flock, MD   20 mg at 03/16/21 2213   buPROPion (WELLBUTRIN SR) 12 hr tablet 150 mg  150 mg Oral Daily Clarnce Flock, MD   150 mg at 03/17/21 1044   furosemide (LASIX) 200 mg in dextrose 5 % 100 mL (2 mg/mL) infusion  5 mg/hr Intravenous Continuous Nolberto Hanlon, MD 2.5 mL/hr at 03/16/21 1515 5 mg/hr at 03/16/21 1515   gabapentin (NEURONTIN) tablet 300 mg  300 mg Oral BID Nolberto Hanlon, MD       guaiFENesin (MUCINEX) 12 hr tablet 600 mg  600 mg Oral BID Nolberto Hanlon, MD   600 mg at 03/17/21 1044    guaiFENesin-dextromethorphan (ROBITUSSIN DM) 100-10 MG/5ML syrup 5 mL  5 mL Oral Q4H PRN Nolberto Hanlon, MD   2.5 mL at 03/15/21 1628   hydrocerin (EUCERIN) cream   Topical BID Nolberto Hanlon, MD   Given at 03/17/21 1046  insulin aspart (novoLOG) injection 0-9 Units  0-9 Units Subcutaneous TID WC Clarnce Flock, MD       levothyroxine (SYNTHROID) tablet 250 mcg  250 mcg Oral Q0600 Clarnce Flock, MD   250 mcg at 03/17/21 0500   ondansetron [facility]) tablet 4 mg  4 mg Oral Q6H PRN Clarnce Flock, MD       Or   ondansetron [facility]) injection 4 mg  4 mg Intravenous Q6H PRN Clarnce Flock, MD       oxyCODONE (Oxy IR/ROXICODONE) immediate release tablet 5 mg  5 mg Oral Q4H PRN Clarnce Flock, MD   5 mg at 03/17/21 1321   polyethylene glycol (MIRALAX / GLYCOLAX) packet 17 g  17 g Oral Daily PRN Clarnce Flock, MD       rOPINIRole (REQUIP) tablet 3 mg  3 mg Oral TID Clarnce Flock, MD   3 mg at 03/17/21 1045   sodium chloride flush (NS) 0.9 % injection 3 mL  3 mL Intravenous Q12H Clarnce Flock, MD   3 mL at 03/17/21 1046      Allergies: No Known Allergies    Past Medical History: Past Medical History:  Diagnosis Date   Diabetes mellitus without complication ([place])    Hypothyroidism    RLS (restless legs syndrome)    Thyroid cancer ([place]) 2007     Past Surgical History: Past Surgical History:  Procedure Laterality Date   ABDOMINAL HYSTERECTOMY     COLONOSCOPY WITH PROPOFOL N/A 08/26/2015   Procedure: COLONOSCOPY WITH PROPOFOL;  Surgeon: Manya Silvas, MD;  Location: [facility] ENDOSCOPY;  Service: Endoscopy;  Laterality: N/A;   THYROIDECTOMY       Family History: Family History  Problem Relation Age of Onset   Breast cancer Neg Hx      Social History: Social History   Socioeconomic History   Marital status: Married    Spouse name: Not on file   Number of children: Not on file   Years of education: Not on file   Highest education level: Not on file   Occupational History   Not on file  Tobacco Use   Smoking status: Never   Smokeless tobacco: Never  Substance and Sexual Activity   Alcohol use: No   Drug use: No   Sexual activity: Not on file  Other Topics Concern   Not on file  Social History Narrative   Not on file   Social Determinants of Health   Financial Resource Strain: Not on file  Food Insecurity: Not on file  Transportation Needs: Not on file  Physical Activity: Not on file  Stress: Not on file  Social Connections: Not on file  Intimate Partner Violence: Not on file     Review of Systems: Review of Systems  Constitutional:  Positive for malaise/fatigue. Negative for chills and fever.  HENT:  Negative for congestion, hearing loss and tinnitus.   Eyes:  Negative for blurred vision and double vision.  Respiratory:  Positive for shortness of breath. Negative for cough and sputum production.   Cardiovascular:  Positive for leg swelling. Negative for chest pain, palpitations and orthopnea.  Gastrointestinal:  Negative for diarrhea, nausea and vomiting.  Genitourinary:  Negative for dysuria, frequency and urgency.  Musculoskeletal:  Negative for myalgias.  Skin:  Negative for itching and rash.  Neurological:  Negative for dizziness and focal weakness.  Endo/Heme/Allergies:  Negative for polydipsia. Does not bruise/bleed easily.  Psychiatric/Behavioral:  Negative for depression. The  patient is not nervous/anxious.     Vital Signs: Blood pressure 129/72, pulse 67, temperature 98.3 F (36.8 C), temperature source Oral, resp. rate 18, height _0  (1.626 m), weight (!) 152.2 kg, SpO2 95 %.  Weight trends: Filed Weights   03/14/21 1805 03/16/21 0500 03/17/21 0405  Weight: (!) 151.5 kg (!) 150.2 kg (!) 152.2 kg     Physical Exam: General: No acute distress  Head: Normocephalic, atraumatic. Moist oral mucosal membranes  Eyes: Anicteric  Neck: Supple  Lungs:  Mild basilar rales, normal effort  Heart: S1S2 no  rubs  Abdomen:  Soft, nontender, bowel sounds present  Extremities: 2+ lower extremity edema with dependent rubor  Neurologic: Awake, alert, following commands  Skin: Erythema bilateral lower extremities  Access: No hemodialysis access    Lab results: Basic Metabolic Panel: Recent Labs  Lab 03/15/21 0527 03/16/21 0410 03/17/21 0442  NA 142 142 140  K 4.2 4.7 4.2  CL 103 101 95*  CO2 32 34* 39*  GLUCOSE 87 90 84  BUN 34* 35* 29*  CREATININE 1.34* 1.46* 1.16*  CALCIUM 8.1* 8.3* 8.3*    Liver Function Tests: Recent Labs  Lab 03/14/21 1817  AST 25  ALT 13  ALKPHOS 103  BILITOT 2.0*  PROT 7.4  ALBUMIN 3.1*   No results for input(s): LIPASE, AMYLASE in the last 168 hours. No results for input(s): AMMONIA in the last 168 hours.  CBC: Recent Labs  Lab 03/14/21 1817 03/15/21 0527  WBC 11.2* 10.6*  NEUTROABS 8.4*  --   HGB 13.2 12.4  HCT 45.8 43.8  MCV 83.4 83.9  PLT 429* 397    Cardiac Enzymes: No results for input(s): CKTOTAL, CKMB, CKMBINDEX, TROPONINI in the last 168 hours.  BNP: Invalid input(s): POCBNP  CBG: Recent Labs  Lab 03/16/21 1123 03/16/21 1609 03/16/21 2159 03/17/21 0722 03/17/21 1111  GLUCAP 111* 115* 123* 106* 134*    Microbiology: Results for orders placed or performed during the hospital encounter of 03/14/21  Resp Panel by RT-PCR (Flu A&B, Covid)     Status: None   Collection Time: 03/14/21  8:21 PM   Specimen: Nasopharyngeal(NP) swabs in vial transport medium  Result Value Ref Range Status   SARS Coronavirus 2 by RT PCR NEGATIVE NEGATIVE Final    Comment: (NOTE) SARS-CoV-2 target nucleic acids are NOT DETECTED.  The SARS-CoV-2 RNA is generally detectable in upper respiratory specimens during the acute phase of infection. The lowest concentration of SARS-CoV-2 viral copies this assay can detect is 138 copies/mL. A negative result does not preclude SARS-Cov-2 infection and should not be used as the sole basis for treatment  or other patient management decisions. A negative result may occur with  improper specimen collection/handling, submission of specimen other than nasopharyngeal swab, presence of viral mutation(s) within the areas targeted by this assay, and inadequate number of viral copies(<138 copies/mL). A negative result must be combined with clinical observations, patient history, and epidemiological information. The expected result is Negative.  Fact Sheet for Patients:  EntrepreneurPulse.com.au  Fact Sheet for Healthcare Providers:  IncredibleEmployment.be  This test is no t yet approved or cleared by the [place] FDA and  has been authorized for detection and/or diagnosis of SARS-CoV-2 by FDA under an Emergency Use Authorization (EUA). This EUA will remain  in effect (meaning this test can be used) for the duration of the COVID-19 declaration under Section 564(b)(1) of the Act, 21 U.S.C.section 360bbb-3(b)(1), unless the authorization is terminated  or revoked sooner.  Influenza A by PCR NEGATIVE NEGATIVE Final   Influenza B by PCR NEGATIVE NEGATIVE Final    Comment: (NOTE) The Xpert Xpress SARS-CoV-2/FLU/RSV plus assay is intended as an aid in the diagnosis of influenza from Nasopharyngeal swab specimens and should not be used as a sole basis for treatment. Nasal washings and aspirates are unacceptable for Xpert Xpress SARS-CoV-2/FLU/RSV testing.  Fact Sheet for Patients: EntrepreneurPulse.com.au  Fact Sheet for Healthcare Providers: IncredibleEmployment.be  This test is not yet approved or cleared by the [place] FDA and has been authorized for detection and/or diagnosis of SARS-CoV-2 by FDA under an Emergency Use Authorization (EUA). This EUA will remain in effect (meaning this test can be used) for the duration of the COVID-19 declaration under Section 564(b)(1) of the Act, 21 U.S.C. section  360bbb-3(b)(1), unless the authorization is terminated or revoked.  Performed at [facility], [address] [place], [place] [address]     Coagulation Studies: No results for input(s): LABPROT, INR in the last 72 hours.  Urinalysis: Recent Labs    03/14/21 2021  COLORURINE YELLOW*  LABSPEC >1.030*  PHURINE 5.5  GLUCOSEU NEGATIVE  HGBUR MODERATE*  BILIRUBINUR SMALL*  KETONESUR NEGATIVE  PROTEINUR 100*  NITRITE NEGATIVE  LEUKOCYTESUR SMALL*      Imaging: ECHOCARDIOGRAM COMPLETE  Result Date: 03/17/2021    ECHOCARDIOGRAM REPORT   Patient Name:   LULAR LETSON Date of Exam: 03/16/2021 Medical Rec #:  [id number]             Height:       64.0 in Accession #:    [id number]            Weight:       331.1 lb Date of Birth:  08/31/1955             BSA:          2.424 m Patient Age:    13 years              BP:           138/77 mmHg Patient Gender: F                     HR:           58 bpm. Exam Location:  [facility] Procedure: 2D Echo and Intracardiac Opacification Agent Indications:     CHF I50.31  History:         Patient has prior history of Echocardiogram examinations, most                  recent 02/21/2020.  Sonographer:     Kathlen Brunswick RDCS Referring Phys:  [id number] [place] M ECKSTAT Diagnosing Phys: Serafina Royals MD  Sonographer Comments: Technically difficult study due to poor echo windows and patient is morbidly obese. Image acquisition challenging due to patient body habitus. IMPRESSIONS  1. Left ventricular ejection fraction, by estimation, is 45 to 50%. The left ventricle has mildly decreased function. The left ventricle has no regional wall motion abnormalities. Left ventricular diastolic parameters were normal.  2. Right ventricular systolic function is normal. The right ventricular size is normal.  3. The mitral valve is normal in structure. Trivial mitral valve regurgitation.  4. The aortic valve is normal in structure. Aortic valve regurgitation is not  visualized. FINDINGS  Left Ventricle: Left ventricular ejection fraction, by estimation, is 45 to 50%. The left ventricle has mildly decreased function. The left ventricle has no regional wall  motion abnormalities. Definity contrast agent was given IV to delineate the left ventricular endocardial borders. The left ventricular internal cavity size was normal in size. There is no left ventricular hypertrophy. Left ventricular diastolic parameters were normal. Right Ventricle: The right ventricular size is normal. No increase in right ventricular wall thickness. Right ventricular systolic function is normal. Left Atrium: Left atrial size was normal in size. Right Atrium: Right atrial size was normal in size. Pericardium: There is no evidence of pericardial effusion. Mitral Valve: The mitral valve is normal in structure. Trivial mitral valve regurgitation. Tricuspid Valve: The tricuspid valve is normal in structure. Tricuspid valve regurgitation is trivial. Aortic Valve: The aortic valve is normal in structure. Aortic valve regurgitation is not visualized. Aortic regurgitation PHT measures 500 msec. Aortic valve peak gradient measures 7.0 mmHg. Pulmonic Valve: The pulmonic valve was normal in structure. Pulmonic valve regurgitation is not visualized. Aorta: The aortic root and ascending aorta are structurally normal, with no evidence of dilitation. IAS/Shunts: No atrial level shunt detected by color flow Doppler.  LEFT VENTRICLE PLAX 2D LVIDd:         6.10 cm  Diastology LVIDs:         4.70 cm  LV e' medial:    3.81 cm/s LV PW:         1.10 cm  LV E/e' medial:  24.6 LV IVS:        1.10 cm  LV e' lateral:   8.16 cm/s LVOT diam:     2.10 cm  LV E/e' lateral: 11.5 LV SV:         69 LV SV Index:   29 LVOT Area:     3.46 cm  RIGHT VENTRICLE RV Basal diam:  4.70 cm RV S prime:     10.80 cm/s TAPSE (M-mode): 2.2 cm LEFT ATRIUM           Index       RIGHT ATRIUM           Index LA diam:      3.00 cm 1.24 cm/m  RA Area:      17.20 cm LA Vol (A2C): 35.0 ml 14.44 ml/m RA Volume:   46.70 ml  19.27 ml/m LA Vol (A4C): 53.7 ml 22.15 ml/m  AORTIC VALVE                PULMONIC VALVE AV Area (Vmax): 2.38 cm    PV Vmax:       1.38 m/s AV Vmax:        132.00 cm/s PV Peak grad:  7.6 mmHg AV Peak Grad:   7.0 mmHg LVOT Vmax:      90.70 cm/s LVOT Vmean:     60.200 cm/s LVOT VTI:       0.200 m AI PHT:         500 msec  AORTA Ao Root diam: 5.00 cm Ao Asc diam:  3.70 cm MITRAL VALVE MV Area (PHT): 2.67 cm    SHUNTS MV Decel Time: 284 msec    Systemic VTI:  0.20 m MV E velocity: 93.80 cm/s  Systemic Diam: 2.10 cm MV A velocity: 86.50 cm/s MV E/A ratio:  1.08 Serafina Royals MD Electronically signed by Serafina Royals MD Signature Date/Time: 03/17/2021/6:29:25 AM    Final      Assessment & Plan:  Pt is a 65 y.o. female with a PMHx of congestive heart failure, morbid obesity, hypothyroidism, diabetes mellitus type 2, obstructive sleep apnea, lymphedema, chronic kidney  disease stage IIIa baseline EGFR 47, who was admitted to [facility] on 03/14/2021 for evaluation of shortness of breath and hypoxia.  1.  Acute kidney injury/chronic kidney disease stage IIIb baseline EGFR 47/diabetes mellitus type 2 with chronic kidney disease.  Renal function initially was worsening.  However with Lasix drip renal function actually improved and creatinine down to 1.16.  Check renal ultrasound to make sure no underlying obstruction.  No immediate need for dialysis.  2.  Acute failure.  Ejection fraction currently 45 to 50%.  Patient switched over to Lasix drip which we will continue at this time.  3.  I would like to thank Dr. Kurtis Bushman for the kind referral.

## 2021-03-17 NOTE — Progress Notes (Signed)
PROGRESS NOTE    [name]  [id number] DOB: 1956/03/13 DOA: 03/14/2021 PCP: Rusty Aus, MD    Brief Narrative:  Madison Mcbride is a 65 y.o. female with history of diastolic CHF, morbid obesity, hypothyroidism, type 2 diabetes, OSA, lymphedema, CKD, who presents with shortness of breath.[id number] with vascular congestion and mild edema   In the ED vital signs notable for hypoxia on arrival to 81% that improved on 3 L nasal cannula, remainder of vital signs notable only for hypertension.  CBC was largely unremarkable.  BNP was elevated to 1805.  Troponin was borderline elevated at 20.  CMP showed bilirubin of 2 but otherwise remainder of values were at her baseline.  Chest x-ray showed cardiomegaly and pulmonary edema.  EKG was pending.  She was started on IV diuretics and admitted for further management of her hypoxemic respiratory failure.  9/5-was started on Lasix drip on 9/4.  She is starting to feel better and reports good urine output.  She would like to get her gabapentin as she is having neuropathic pain in her legs.  Her shortness of breath is mildly better not at baseline with exertion.  Good urine output.  Consultants:  Nephrology  Procedures:   Antimicrobials:      Subjective: No dizziness, lightheadedness, chest pain  Objective: Vitals:   03/16/21 1932 03/16/21 2311 03/17/21 0405 03/17/21 0717  BP: (!) 132/54 119/72 121/69 127/73  Pulse: 62 61 70 71  Resp: '17 17 18 18  '$ Temp: 97.8 F (36.6 C) 98.8 F (37.1 C) 98.6 F (37 C) 97.6 F (36.4 C)  TempSrc:    Oral  SpO2: 97% 97% 93% 92%  Weight:   (!) 152.2 kg   Height:        Intake/Output Summary (Last 24 hours) at 03/17/2021 0823 Last data filed at 03/17/2021 [id number] Gross per 24 hour  Intake 480 ml  Output 3150 ml  Net -2670 ml   Filed Weights   03/14/21 1805 03/16/21 0500 03/17/21 0405  Weight: (!) 151.5 kg (!) 150.2 kg (!) 152.2 kg    Examination:  NAD, calm Decreased  breath sounds no wheezing Regular S1-S2 no gallops Soft benign positive bowel sounds Decrease lower extremity edema right leg wrapped Aaxox4 Mood and affect appropriate in current setting   Data Reviewed: I have personally reviewed following labs and imaging studies  CBC: Recent Labs  Lab 03/14/21 1817 03/15/21 0527  WBC 11.2* 10.6*  NEUTROABS 8.4*  --   HGB 13.2 12.4  HCT 45.8 43.8  MCV 83.4 83.9  PLT 429* [id number]   Basic Metabolic Panel: Recent Labs  Lab 03/14/21 1817 03/15/21 0527 03/16/21 0410 03/17/21 0442  NA 140 142 142 140  K 4.2 4.2 4.7 4.2  CL 100 103 101 95*  CO2 29 32 34* 39*  GLUCOSE 92 87 90 84  BUN 34* 34* 35* 29*  CREATININE 1.27* 1.34* 1.46* 1.16*  CALCIUM 8.4* 8.1* 8.3* 8.3*   GFR: Estimated Creatinine Clearance: 70.6 mL/min (A) (by C-G formula based on SCr of 1.16 mg/dL (H)). Liver Function Tests: Recent Labs  Lab 03/14/21 1817  AST 25  ALT 13  ALKPHOS 103  BILITOT 2.0*  PROT 7.4  ALBUMIN 3.1*   No results for input(s): LIPASE, AMYLASE in the last 168 hours. No results for input(s): AMMONIA in the last 168 hours. Coagulation Profile: No results for input(s): INR, PROTIME in the last 168 hours. Cardiac Enzymes: No results for input(s): CKTOTAL, CKMB, CKMBINDEX, TROPONINI in  the last 168 hours. BNP (last 3 results) No results for input(s): PROBNP in the last 8760 hours. HbA1C: Recent Labs    03/15/21 0527  HGBA1C 6.3*   CBG: Recent Labs  Lab 03/16/21 0742 03/16/21 1123 03/16/21 1609 03/16/21 2159 03/17/21 0722  GLUCAP 90 111* 115* 123* 106*   Lipid Profile: No results for input(s): CHOL, HDL, LDLCALC, TRIG, CHOLHDL, LDLDIRECT in the last 72 hours. Thyroid Function Tests: No results for input(s): TSH, T4TOTAL, FREET4, T3FREE, THYROIDAB in the last 72 hours. Anemia Panel: No results for input(s): VITAMINB12, FOLATE, FERRITIN, TIBC, IRON, RETICCTPCT in the last 72 hours. Sepsis Labs: Recent Labs  Lab 03/14/21 1811   LATICACIDVEN 2.5*    Recent Results (from the past 240 hour(s))  Resp Panel by RT-PCR (Flu A&B, Covid)     Status: None   Collection Time: 03/14/21  8:21 PM   Specimen: Nasopharyngeal(NP) swabs in vial transport medium  Result Value Ref Range Status   SARS Coronavirus 2 by RT PCR NEGATIVE NEGATIVE Final    Comment: (NOTE) SARS-CoV-2 target nucleic acids are NOT DETECTED.  The SARS-CoV-2 RNA is generally detectable in upper respiratory specimens during the acute phase of infection. The lowest concentration of SARS-CoV-2 viral copies this assay can detect is 138 copies/mL. A negative result does not preclude SARS-Cov-2 infection and should not be used as the sole basis for treatment or other patient management decisions. A negative result may occur with  improper specimen collection/handling, submission of specimen other than nasopharyngeal swab, presence of viral mutation(s) within the areas targeted by this assay, and inadequate number of viral copies(<138 copies/mL). A negative result must be combined with clinical observations, patient history, and epidemiological information. The expected result is Negative.  Fact Sheet for Patients:  EntrepreneurPulse.com.au  Fact Sheet for Healthcare Providers:  IncredibleEmployment.be  This test is no t yet approved or cleared by the [place] FDA and  has been authorized for detection and/or diagnosis of SARS-CoV-2 by FDA under an Emergency Use Authorization (EUA). This EUA will remain  in effect (meaning this test can be used) for the duration of the COVID-19 declaration under Section 564(b)(1) of the Act, 21 U.S.C.section 360bbb-3(b)(1), unless the authorization is terminated  or revoked sooner.       Influenza A by PCR NEGATIVE NEGATIVE Final   Influenza B by PCR NEGATIVE NEGATIVE Final    Comment: (NOTE) The Xpert Xpress SARS-CoV-2/FLU/RSV plus assay is intended as an aid in the  diagnosis of influenza from Nasopharyngeal swab specimens and should not be used as a sole basis for treatment. Nasal washings and aspirates are unacceptable for Xpert Xpress SARS-CoV-2/FLU/RSV testing.  Fact Sheet for Patients: EntrepreneurPulse.com.au  Fact Sheet for Healthcare Providers: IncredibleEmployment.be  This test is not yet approved or cleared by the [place] FDA and has been authorized for detection and/or diagnosis of SARS-CoV-2 by FDA under an Emergency Use Authorization (EUA). This EUA will remain in effect (meaning this test can be used) for the duration of the COVID-19 declaration under Section 564(b)(1) of the Act, 21 U.S.C. section 360bbb-3(b)(1), unless the authorization is terminated or revoked.  Performed at [facility], [address] [place], [place] [address]          Radiology Studies: ECHOCARDIOGRAM COMPLETE  Result Date: 03/17/2021    ECHOCARDIOGRAM REPORT   Patient Name:   NYLE BLAKENSHIP Date of Exam: 03/16/2021 Medical Rec #:  [id number]  Height:       64.0 in Accession #:    [id number]            Weight:       331.1 lb Date of Birth:  10/24/1955             BSA:          2.424 m Patient Age:    25 years              BP:           138/77 mmHg Patient Gender: F                     HR:           58 bpm. Exam Location:  [facility] Procedure: 2D Echo and Intracardiac Opacification Agent Indications:     CHF I50.31  History:         Patient has prior history of Echocardiogram examinations, most                  recent 02/21/2020.  Sonographer:     Kathlen Brunswick RDCS Referring Phys:  [id number] [place] M ECKSTAT Diagnosing Phys: Serafina Royals MD  Sonographer Comments: Technically difficult study due to poor echo windows and patient is morbidly obese. Image acquisition challenging due to patient body habitus. IMPRESSIONS  1. Left ventricular ejection fraction, by estimation, is 45 to 50%. The left  ventricle has mildly decreased function. The left ventricle has no regional wall motion abnormalities. Left ventricular diastolic parameters were normal.  2. Right ventricular systolic function is normal. The right ventricular size is normal.  3. The mitral valve is normal in structure. Trivial mitral valve regurgitation.  4. The aortic valve is normal in structure. Aortic valve regurgitation is not visualized. FINDINGS  Left Ventricle: Left ventricular ejection fraction, by estimation, is 45 to 50%. The left ventricle has mildly decreased function. The left ventricle has no regional wall motion abnormalities. Definity contrast agent was given IV to delineate the left ventricular endocardial borders. The left ventricular internal cavity size was normal in size. There is no left ventricular hypertrophy. Left ventricular diastolic parameters were normal. Right Ventricle: The right ventricular size is normal. No increase in right ventricular wall thickness. Right ventricular systolic function is normal. Left Atrium: Left atrial size was normal in size. Right Atrium: Right atrial size was normal in size. Pericardium: There is no evidence of pericardial effusion. Mitral Valve: The mitral valve is normal in structure. Trivial mitral valve regurgitation. Tricuspid Valve: The tricuspid valve is normal in structure. Tricuspid valve regurgitation is trivial. Aortic Valve: The aortic valve is normal in structure. Aortic valve regurgitation is not visualized. Aortic regurgitation PHT measures 500 msec. Aortic valve peak gradient measures 7.0 mmHg. Pulmonic Valve: The pulmonic valve was normal in structure. Pulmonic valve regurgitation is not visualized. Aorta: The aortic root and ascending aorta are structurally normal, with no evidence of dilitation. IAS/Shunts: No atrial level shunt detected by color flow Doppler.  LEFT VENTRICLE PLAX 2D LVIDd:         6.10 cm  Diastology LVIDs:         4.70 cm  LV e' medial:    3.81 cm/s LV  PW:         1.10 cm  LV E/e' medial:  24.6 LV IVS:        1.10 cm  LV e' lateral:   8.16 cm/s LVOT diam:  2.10 cm  LV E/e' lateral: 11.5 LV SV:         69 LV SV Index:   29 LVOT Area:     3.46 cm  RIGHT VENTRICLE RV Basal diam:  4.70 cm RV S prime:     10.80 cm/s TAPSE (M-mode): 2.2 cm LEFT ATRIUM           Index       RIGHT ATRIUM           Index LA diam:      3.00 cm 1.24 cm/m  RA Area:     17.20 cm LA Vol (A2C): 35.0 ml 14.44 ml/m RA Volume:   46.70 ml  19.27 ml/m LA Vol (A4C): 53.7 ml 22.15 ml/m  AORTIC VALVE                PULMONIC VALVE AV Area (Vmax): 2.38 cm    PV Vmax:       1.38 m/s AV Vmax:        132.00 cm/s PV Peak grad:  7.6 mmHg AV Peak Grad:   7.0 mmHg LVOT Vmax:      90.70 cm/s LVOT Vmean:     60.200 cm/s LVOT VTI:       0.200 m AI PHT:         500 msec  AORTA Ao Root diam: 5.00 cm Ao Asc diam:  3.70 cm MITRAL VALVE MV Area (PHT): 2.67 cm    SHUNTS MV Decel Time: 284 msec    Systemic VTI:  0.20 m MV E velocity: 93.80 cm/s  Systemic Diam: 2.10 cm MV A velocity: 86.50 cm/s MV E/A ratio:  1.08 Serafina Royals MD Electronically signed by Serafina Royals MD Signature Date/Time: 03/17/2021/6:29:25 AM    Final         Scheduled Meds:  aspirin EC  81 mg Oral Daily   atorvastatin  20 mg Oral Q2200   buPROPion  150 mg Oral Daily   guaiFENesin  600 mg Oral BID   hydrocerin   Topical BID   insulin aspart  0-9 Units Subcutaneous TID WC   levothyroxine  250 mcg Oral Q0600   rOPINIRole  3 mg Oral TID   sodium chloride flush  3 mL Intravenous Q12H   Continuous Infusions:  furosemide (LASIX) 200 mg in dextrose 5% 100 mL ('2mg'$ /mL) infusion 5 mg/hr (03/16/21 1515)    Assessment & Plan:   Principal Problem:   Acute on chronic diastolic CHF (congestive heart failure) (HCC) Active Problems:   Type 2 diabetes mellitus with diabetic neuropathy, without long-term current use of insulin (HCC)   Hypothyroidism   Morbid obesity with BMI of 50.0-59.9, adult (HCC)   OSA (obstructive sleep  apnea)   Acute hypoxemic respiratory failure (HCC)   #Acute on chronic diastolic CHF #Acute hypoxemic respiratory failure Presenting with signs and symptoms of acute CHF exacerbation, etiology not clear.  Patient seems to be unaware of diagnosis. 9/3-we will obtain echocardiogram Creatinine mildly up with Lasix will decrease dose to 20 mg IV twice daily BNP on admission was elevated Strict I's and O's Daily weight Keep O2 sat above 92% 9/5-clinically improving slowly with IV Lasix drip. BNP elevated Still volume overloaded Will continue Lasix drip I's and O's, daily weight Echo with 45 to 50% EF.  No regional wall motion abnormalities. She will need to f/u with CHF clinic as outpt.  Cards consulted   #Elevated troponin Minimal elevation.  Likely demand ischemia due to above 9/5EF  mildly less than before.  Cards consulted.   AKI- renal function mildly down with diuresis. Likely due to prerenal azotemia from CHF. Improving with Lasix drip Creatinine 1.16 today Nephrology consulted pending Monitor renal function and electrolytes closely while diuresing  DM2-hold home Ozempic BG stable Continue R-ISS Start gabapentin at lower dose since renal function was worse.  We will start gabapentin 300 mg twice daily.  I did discuss with patient   CAD-continue aspirin and atorvastatin Depression-ccontinue bupropion Restless leg syndrome-continue ropinirole Hypothyroidism-continue Synthroid       DVT prophylaxis: Lovenox Code Status: Full Family Communication: None at bedside Disposition Plan:  Status is: Inpatient  Remains inpatient appropriate because:IV treatments appropriate due to intensity of illness or inability to take PO  Dispo: The patient is from: Home              Anticipated d/c is to: Home              Patient currently is not medically stable to d/c.   Difficult to place patient No    Still needing IV Lasix drip.  Cardiology and nephrology pending.         LOS: 3 days   Time spent: 35 minutes with more than 50% on [place], MD [facility] Pager [phone]  If 7PM-7AM, please contact night-coverage 03/17/2021, 8:23 AM

## 2021-03-17 NOTE — TOC Initial Note (Signed)
Transition of Care [facility]) - Initial/Assessment Note    Patient Details  Name: Madison Mcbride MRN: [id number] Date of Birth: [birth year]  Transition of Care [facility]) CM/SW Contact:    Alberteen Sam, LCSW Phone Number: 03/17/2021, 11:55 AM  Clinical Narrative:                  CSW met with patient at bedside to discuss SNF rec. She reports she lives home with her husband and a family/friend comes daily to the home to help out as well. Reports she currently has a walker and a 3in1 at home, is currently on 3L O2 in the hospital, has no O2 at home. Will continue to follow for any potential discharge O2 needs.   Patient reports she is unsure if she wants to go to SNF at discharge and needs to have this discussion with her husband, requests CSW follow up tomorrow on her decision.   TOC to continue to follow for discharge planning.   Expected Discharge Plan:  (to be determined) Barriers to Discharge: Continued Medical Work up   Patient Goals and CMS Choice Patient states their goals for this hospitalization and ongoing recovery are:: to go home CMS Medicare.gov Compare Post Acute Care list provided to:: Patient Choice offered to / list presented to : Patient  Expected Discharge Plan and Services Expected Discharge Plan:  (to be determined)       Living arrangements for the past 2 months: Single Family Home                                      Prior Living Arrangements/Services Living arrangements for the past 2 months: Single Family Home Lives with:: Spouse Patient language and need for interpreter reviewed:: Yes Do you feel safe going back to the place where you live?: Yes      Need for Family Participation in Patient Care: Yes (Comment) Care giver support system in place?: Yes (comment)      Activities of Daily Living Home Assistive Devices/Equipment: CPAP, Walker (specify type) ADL Screening (condition at time of admission) Patient's cognitive ability adequate to  safely complete daily activities?: Yes Is the patient deaf or have difficulty hearing?: No Does the patient have difficulty seeing, even when wearing glasses/contacts?: No Does the patient have difficulty concentrating, remembering, or making decisions?: No Patient able to express need for assistance with ADLs?: Yes Does the patient have difficulty dressing or bathing?: Yes Independently performs ADLs?: No Does the patient have difficulty walking or climbing stairs?: Yes Weakness of Legs: Both Weakness of Arms/Hands: None  Permission Sought/Granted                  Emotional Assessment Appearance:: Appears stated age Attitude/Demeanor/Rapport: Gracious Affect (typically observed): Calm Orientation: : Oriented to Self, Oriented to Place, Oriented to  Time, Oriented to Situation Alcohol / Substance Use: Not Applicable Psych Involvement: No (comment)  Admission diagnosis:  Acute on chronic heart failure (HCC) [I50.9] Acute on chronic congestive heart failure, unspecified heart failure type [facility]) [I50.9] Patient Active Problem List   Diagnosis Date Noted   Acute on chronic heart failure ([place]) 03/14/2021   Acute hypoxemic respiratory failure ([place]) 03/14/2021   Acute on chronic diastolic CHF (congestive heart failure) ([place]) 04/09/2020   Diabetes mellitus without complication ([place]) 56/38/9373   HLD (hyperlipidemia) 04/09/2020   Depression 04/09/2020   Elevated troponin 04/09/2020   OSA (  obstructive sleep apnea) 04/09/2020   Peripheral polyneuropathy 02/29/2020   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   Lumbar radiculopathy, acute    Acute midline low back pain without sciatica    Type 2 diabetes mellitus with diabetic neuropathy, without long-term current use of insulin ([place]) 02/21/2020   Hypothyroidism 02/21/2020   Morbid obesity with BMI of 50.0-59.9, adult ([place]) 02/21/2020   Severe sepsis ([place]) 02/21/2020   CAP (community acquired  pneumonia) 02/21/2020   Bilateral cellulitis of lower leg 02/21/2020   Acute respiratory failure with hypoxia and hypercapnia ([place]) 02/21/2020   Acute renal failure superimposed on stage 3a chronic kidney disease ([place]) 35/59/7416   Acute metabolic encephalopathy 38/45/3646   Septic shock ([place]) 02/21/2020   Stage 3a chronic kidney disease ([place]) 01/27/2020   Lymphedema of both lower extremities 08/21/2016   Benign essential hypertension 05/18/2016   PCP:  Rusty Aus, MD Pharmacy:   [id number] [id number]: [phone]: [phone]    Social Determinants of Health (SDOH) Interventions    Readmission Risk Interventions No flowsheet data found.

## 2021-03-18 ENCOUNTER — Encounter: Payer: Self-pay | Admitting: Family Medicine

## 2021-03-18 LAB — BASIC METABOLIC PANEL
Anion gap: 8 (ref 5–15)
BUN: 24 mg/dL — ABNORMAL HIGH (ref 8–23)
CO2: 42 mmol/L — ABNORMAL HIGH (ref 22–32)
Calcium: 8.5 mg/dL — ABNORMAL LOW (ref 8.9–10.3)
Chloride: 91 mmol/L — ABNORMAL LOW (ref 98–111)
Creatinine, Ser: 1.06 mg/dL — ABNORMAL HIGH (ref 0.44–1.00)
GFR, Estimated: 58 mL/min — ABNORMAL LOW (ref >60)
Glucose, Bld: 94 mg/dL (ref 70–99)
Potassium: 3.9 mmol/L (ref 3.5–5.1)
Sodium: 141 mmol/L (ref 135–145)

## 2021-03-18 LAB — GLUCOSE, CAPILLARY
Glucose-Capillary: 94 mg/dL (ref 70–99)
Glucose-Capillary: 120 mg/dL — ABNORMAL HIGH (ref 70–99)
Glucose-Capillary: 109 mg/dL — ABNORMAL HIGH (ref 70–99)
Glucose-Capillary: 96 mg/dL (ref 70–99)

## 2021-03-18 LAB — MAGNESIUM: Magnesium: 1.9 mg/dL (ref 1.7–2.4)

## 2021-03-18 MED ORDER — ACETAZOLAMIDE ER 500 MG PO CP12
500.0000 mg | ORAL_CAPSULE | Freq: Two times a day (BID) | ORAL | Status: DC
  Administered 2021-03-18 – 2021-03-22 (×10): 500 mg via ORAL
  Filled 2021-03-18 (×12): qty 1

## 2021-03-18 NOTE — Consult Note (Signed)
[facility] Consultation Note  Patient ID: Madison Mcbride, MRN: [id number], DOB/AGE: [birth year] 65 y.o. Admit date: 03/14/2021   Date of Consult: 03/18/2021 Primary Physician: Rusty Aus, MD Primary Cardiologist: None  Chief Complaint:  Chief Complaint  Patient presents with  . Hypoxia   Reason for Consult:  Heart failure  HPI: 65 y.o. female with known diabetes hypothyroidism hyperlipidemia hypertension and sleep apnea with obesity for which the patient has had recent onset of significant shortness of breath and possible urinary tract infection.  She went to the emergency department for possible urinary tract infection and found to be hypoxic.  Her hypoxia showed that she had a chest x-ray showing pulmonary edema low glomerular filtration rate a BNP of 1352 and troponin of 19.  The patient was given diuresis but had some acute kidney injury for which then has been improved with Lasix drip.  She has had significant urine output and is oxygenating better although not to where her standards are.  She has not had any myocardial infarction with a normal EKG at this time.  She has had some improvements in overall symptoms.  Echocardiogram has shown low normal ejection fraction with minimal valvular heart disease.  There has been no evidence of significant anginal symptoms at this time  Past Medical History:  Diagnosis Date  . Diabetes mellitus without complication ([place])   . Hypothyroidism   . RLS (restless legs syndrome)   . Thyroid cancer [facility]) 2007      Surgical History:  Past Surgical History:  Procedure Laterality Date  . ABDOMINAL HYSTERECTOMY    . COLONOSCOPY WITH PROPOFOL N/A 08/26/2015   Procedure: COLONOSCOPY WITH PROPOFOL;  Surgeon: Manya Silvas, MD;  Location: [facility] ENDOSCOPY;  Service: Endoscopy;  Laterality: N/A;  . THYROIDECTOMY       Home Meds: Prior to Admission medications   Medication Sig Start Date End Date Taking? Authorizing Provider   atorvastatin (LIPITOR) 20 MG tablet Take 20 mg by mouth daily. 01/31/20  Yes [provider]  Biotin 2.5 MG CAPS Take 2.5 mg by mouth daily.    Yes [provider]  buPROPion (WELLBUTRIN XL) 150 MG 24 hr tablet Take 150 mg by mouth daily. 01/10/21  Yes [provider]  furosemide (LASIX) 40 MG tablet Take 40 mg by mouth daily. 01/27/20  Yes [provider]  gabapentin (NEURONTIN) 300 MG capsule Take 300 mg by mouth See admin instructions. TAKE 2 CAPSULES IN THE MORNING, 1 CAPSULE IN THE AFTERNOON, AND 2 CAPSULES AT NIGHT 09/18/20  Yes [provider]  levothyroxine (SYNTHROID) 125 MCG tablet Take 125 mcg by mouth daily. 11/11/20 11/12/21 Yes [provider]  Multiple Vitamin (MULTIVITAMIN) tablet Take 1 tablet by mouth daily.   Yes [provider]  pantoprazole (PROTONIX) 40 MG tablet Take 40 mg by mouth daily. 01/11/21  Yes [provider]  propranolol (INDERAL) 20 MG tablet Take 20 mg by mouth 3 (three) times daily. 01/30/21  Yes [provider]  rOPINIRole (REQUIP) 3 MG tablet Take 3 mg by mouth 3 (three) times daily. 01/20/18  Yes [provider]  Semaglutide, 1 MG/DOSE, 2 MG/1.5ML SOPN Inject 0.75 mLs into the skin once a week. 10/14/20  Yes [provider]  zinc gluconate 50 MG tablet Take 50 mg by mouth daily.   Yes [provider]  acetaminophen (TYLENOL) 500 MG tablet Take 1,000 mg by mouth every 8 (eight) hours. Patient not taking: Reported on 03/15/2021  [provider]  aspirin EC 81 MG EC tablet Take 1 tablet (81 mg total) by mouth daily. Swallow whole. Patient not taking: Reported on 03/15/2021 04/18/20   Lorella Nimrod, MD  atorvastatin (LIPITOR) 20 MG tablet Take 20 mg by mouth daily at 10 pm. 01/31/20 01/30/21  [provider]  Cholecalciferol (D3-1000) 25 MCG (1000 UT) tablet Take 1,000 Units by mouth daily. Patient not taking: Reported on 03/15/2021    [provider]   dextromethorphan-guaiFENesin (MUCINEX DM) 30-600 MG 12hr tablet Take 1 tablet by mouth 2 (two) times daily as needed for cough. Patient not taking: Reported on 03/15/2021 04/18/20   Lorella Nimrod, MD  gabapentin (NEURONTIN) 100 MG capsule Take 2 capsules (200 mg total) by mouth 3 (three) times daily. Patient taking differently: Take 300 mg by mouth 3 (three) times daily.  03/01/20 04/09/20  Loletha Grayer, MD  HYDROcodone bit-homatropine (HYCODAN) 5-1.5 MG/5ML syrup Take 5 mLs by mouth 2 (two) times daily as needed for cough. Patient not taking: Reported on 03/15/2021 03/13/21   [provider]  methocarbamol (ROBAXIN) 500 MG tablet Take 1 tablet (500 mg total) by mouth every 8 (eight) hours as needed for muscle spasms. Patient not taking: No sig reported 03/01/20   Loletha Grayer, MD  midodrine (PROAMATINE) 10 MG tablet Take 1 tablet (10 mg total) by mouth 3 (three) times daily with meals as needed (hypotension SBP <100). Patient not taking: Reported on 03/15/2021 04/18/20   Lorella Nimrod, MD  montelukast (SINGULAIR) 10 MG tablet Take 10 mg by mouth daily. Patient not taking: Reported on 03/15/2021 02/24/21 02/24/22  [provider]  nystatin (MYCOSTATIN/NYSTOP) powder Apply topically 3 (three) times daily. Patient not taking: No sig reported 03/01/20   Loletha Grayer, MD  OZEMPIC, 0.25 OR 0.5 MG/DOSE, 2 MG/1.5ML SOPN Inject 0.375 mLs into the skin every Saturday.  Patient not taking: Reported on 03/15/2021 02/04/20   [provider]  potassium chloride SA (KLOR-CON) 20 MEQ tablet Take 1 tablet (20 mEq total) by mouth daily. Patient not taking: Reported on 04/09/2020 02/27/20 03/28/20  Kayleen Memos, DO    Inpatient Medications:  . aspirin EC  81 mg Oral Daily  . atorvastatin  20 mg Oral Q2200  . buPROPion  150 mg Oral Daily  . gabapentin  300 mg Oral BID  . guaiFENesin  600 mg Oral BID  . hydrocerin   Topical BID  . insulin aspart  0-9 Units Subcutaneous TID WC  .  levothyroxine  250 mcg Oral Q0600  . rOPINIRole  3 mg Oral TID  . sodium chloride flush  3 mL Intravenous Q12H   . furosemide (LASIX) 200 mg in dextrose 5% 100 mL ('2mg'$ /mL) infusion 5 mg/hr (03/16/21 1515)    Allergies: No Known Allergies  Social History   Socioeconomic History  . Marital status: Married    Spouse name: Not on file  . Number of children: Not on file  . Years of education: Not on file  . Highest education level: Not on file  Occupational History  . Not on file  Tobacco Use  . Smoking status: Never  . Smokeless tobacco: Never  Substance and Sexual Activity  . Alcohol use: No  . Drug use: No  . Sexual activity: Not on file  Other Topics Concern  . Not on file  Social History Narrative  . Not on file   Social Determinants of Health   Financial Resource Strain: Not on file  Food Insecurity: Not on  file  Transportation Needs: Not on file  Physical Activity: Not on file  Stress: Not on file  Social Connections: Not on file  Intimate Partner Violence: Not on file     Family History  Problem Relation Age of Onset  . Breast cancer Neg Hx      Review of Systems Positive for shortness of breath Negative for: General:  chills, fever, night sweats or weight changes.  Cardiovascular: PND orthopnea syncope dizziness  Dermatological skin lesions rashes Respiratory: Cough congestion Urologic: Frequent urination urination at night and hematuria Abdominal: negative for nausea, vomiting, diarrhea, bright red blood per rectum, melena, or hematemesis Neurologic: negative for visual changes, and/or hearing changes  All other systems reviewed and are otherwise negative except as noted above.  Labs: No results for input(s): CKTOTAL, CKMB, TROPONINI in the last 72 hours. Lab Results  Component Value Date   WBC 10.6 (H) 03/15/2021   HGB 12.4 03/15/2021   HCT 43.8 03/15/2021   MCV 83.9 03/15/2021   PLT 397 03/15/2021    Recent Labs  Lab 03/14/21 1817  03/15/21 0527 03/18/21 0729  NA 140   < > 141  K 4.2   < > 3.9  CL 100   < > 91*  CO2 29   < > 42*  BUN 34*   < > 24*  CREATININE 1.27*   < > 1.06*  CALCIUM 8.4*   < > 8.5*  PROT 7.4  --   --   BILITOT 2.0*  --   --   ALKPHOS 103  --   --   ALT 13  --   --   AST 25  --   --   GLUCOSE 92   < > 94   < > = values in this interval not displayed.   Lab Results  Component Value Date   CHOL 91 04/10/2020   HDL 13 (L) 04/10/2020   LDLCALC 56 04/10/2020   TRIG 108 04/10/2020   No results found for: DDIMER  Radiology/Studies:  DG Chest Portable 1 View  Result Date: 03/14/2021 CLINICAL DATA:  Hypoxia EXAM: PORTABLE CHEST 1 VIEW COMPARISON:  04/17/2020 FINDINGS: Cardiomegaly with vascular congestion and probable mild edema. No pleural effusion or pneumothorax. IMPRESSION: Cardiomegaly with vascular congestion and mild edema Electronically Signed   By: Donavan Foil M.D.   On: 03/14/2021 19:30   ECHOCARDIOGRAM COMPLETE  Result Date: 03/17/2021    ECHOCARDIOGRAM REPORT   Patient Name:   Madison Mcbride Date of Exam: 03/16/2021 Medical Rec #:  [id number]             Height:       64.0 in Accession #:    [id number]            Weight:       331.1 lb Date of Birth:  [birth year]             BSA:          2.424 m Patient Age:    64 years              BP:           138/77 mmHg Patient Gender: F                     HR:           58 bpm. Exam Location:  [facility] Procedure: 2D Echo and Intracardiac Opacification Agent Indications:     CHF I50.31  History:         Patient has prior history of Echocardiogram examinations, most                  recent 02/21/2020.  Sonographer:     Kathlen Brunswick RDCS Referring Phys:  [id number] Cisco M ECKSTAT Diagnosing Phys: Serafina Royals MD  Sonographer Comments: Technically difficult study due to poor echo windows and patient is morbidly obese. Image acquisition challenging due to patient body habitus. IMPRESSIONS  1. Left ventricular ejection fraction, by estimation, is 45  to 50%. The left ventricle has mildly decreased function. The left ventricle has no regional wall motion abnormalities. Left ventricular diastolic parameters were normal.  2. Right ventricular systolic function is normal. The right ventricular size is normal.  3. The mitral valve is normal in structure. Trivial mitral valve regurgitation.  4. The aortic valve is normal in structure. Aortic valve regurgitation is not visualized. FINDINGS  Left Ventricle: Left ventricular ejection fraction, by estimation, is 45 to 50%. The left ventricle has mildly decreased function. The left ventricle has no regional wall motion abnormalities. Definity contrast agent was given IV to delineate the left ventricular endocardial borders. The left ventricular internal cavity size was normal in size. There is no left ventricular hypertrophy. Left ventricular diastolic parameters were normal. Right Ventricle: The right ventricular size is normal. No increase in right ventricular wall thickness. Right ventricular systolic function is normal. Left Atrium: Left atrial size was normal in size. Right Atrium: Right atrial size was normal in size. Pericardium: There is no evidence of pericardial effusion. Mitral Valve: The mitral valve is normal in structure. Trivial mitral valve regurgitation. Tricuspid Valve: The tricuspid valve is normal in structure. Tricuspid valve regurgitation is trivial. Aortic Valve: The aortic valve is normal in structure. Aortic valve regurgitation is not visualized. Aortic regurgitation PHT measures 500 msec. Aortic valve peak gradient measures 7.0 mmHg. Pulmonic Valve: The pulmonic valve was normal in structure. Pulmonic valve regurgitation is not visualized. Aorta: The aortic root and ascending aorta are structurally normal, with no evidence of dilitation. IAS/Shunts: No atrial level shunt detected by color flow Doppler.  LEFT VENTRICLE PLAX 2D LVIDd:         6.10 cm  Diastology LVIDs:         4.70 cm  LV e' medial:     3.81 cm/s LV PW:         1.10 cm  LV E/e' medial:  24.6 LV IVS:        1.10 cm  LV e' lateral:   8.16 cm/s LVOT diam:     2.10 cm  LV E/e' lateral: 11.5 LV SV:         69 LV SV Index:   29 LVOT Area:     3.46 cm  RIGHT VENTRICLE RV Basal diam:  4.70 cm RV S prime:     10.80 cm/s TAPSE (M-mode): 2.2 cm LEFT ATRIUM           Index       RIGHT ATRIUM           Index LA diam:      3.00 cm 1.24 cm/m  RA Area:     17.20 cm LA Vol (A2C): 35.0 ml 14.44 ml/m RA Volume:   46.70 ml  19.27 ml/m LA Vol (A4C): 53.7 ml 22.15 ml/m  AORTIC VALVE                PULMONIC VALVE AV Area (Vmax):  2.38 cm    PV Vmax:       1.38 m/s AV Vmax:        132.00 cm/s PV Peak grad:  7.6 mmHg AV Peak Grad:   7.0 mmHg LVOT Vmax:      90.70 cm/s LVOT Vmean:     60.200 cm/s LVOT VTI:       0.200 m AI PHT:         500 msec  AORTA Ao Root diam: 5.00 cm Ao Asc diam:  3.70 cm MITRAL VALVE MV Area (PHT): 2.67 cm    SHUNTS MV Decel Time: 284 msec    Systemic VTI:  0.20 m MV E velocity: 93.80 cm/s  Systemic Diam: 2.10 cm MV A velocity: 86.50 cm/s MV E/A ratio:  1.08 Serafina Royals MD Electronically signed by Serafina Royals MD Signature Date/Time: 03/17/2021/6:29:25 AM    Final     EKG: Normal sinus rhythm  Weights: Filed Weights   03/16/21 0500 03/17/21 0405 03/18/21 0618  Weight: (!) 150.2 kg (!) 152.2 kg (!) 143.1 kg     Physical Exam: Blood pressure 120/83, pulse 70, temperature 98.1 F (36.7 C), resp. rate 14, height '5\' 4"'$  (1.626 m), weight (!) 143.1 kg, SpO2 100 %. Body mass index is 54.14 kg/m. General: Well developed, well nourished, in no acute distress. Head eyes ears nose throat: Normocephalic, atraumatic, sclera non-icteric, no xanthomas, nares are without discharge. No apparent thyromegaly and/or mass  Lungs: Normal respiratory effort.  Few wheezes, n few basilar rales, no rhonchi.  Heart: RRR with normal S1 S2. no murmur gallop, no rub, PMI is normal size and placement, carotid upstroke normal without bruit, jugular  venous pressure is normal Abdomen: Soft, non-tender, non-distended with normoactive bowel sounds. No hepatomegaly. No rebound/guarding. No obvious abdominal masses. Abdominal aorta is normal size without bruit Extremities: N trace pedal edema. no cyanosis, no clubbing, no ulcers  Peripheral : 2+ bilateral upper extremity pulses, 2+ bilateral femoral pulses, 2+ bilateral dorsal pedal pulse Neuro: Alert and oriented. No facial asymmetry. No focal deficit. Moves all extremities spontaneously. Musculoskeletal: Normal muscle tone without kyphosis Psych:  Responds to questions appropriately with a normal affect.    Assessment: 64 year old female with acute diastolic dysfunction congestive heart failure sleep apnea and diabetes and acute kidney injury slightly improved since admission without evidence of myocardial infarction  Plan: 1.  Slow diuresis with Lasix drip for further risk reduction and treatment of diastolic dysfunction congestive heart failure 2.  No further cardiac diagnostics necessary at this time due to no evidence of myocardial infarction 3.  Continue watching closely for and ability to add beta-blocker to her regimen for further risk reduction of diastolic dysfunction congestive heart failure 4.  Counseling on low-sodium diet and congestive heart failure 5.  Continue treatment of sleep apnea  Signed, Corey Skains M.D. [facility] 03/18/2021, 8:41 AM

## 2021-03-18 NOTE — NC FL2 (Signed)
[place] LEVEL OF CARE SCREENING TOOL     IDENTIFICATION  Patient Name: Madison Mcbride Birthdate: 1955-11-24 Sex: female Admission Date (Current Location): 03/14/2021  [facility] and [place] Number:  Engineering geologist and Address:  [facility], [address], [place], [place] [address]      Provider Number: [id number]  Attending Physician Name and Address:  Sharen Hones, MD  Relative Name and Phone Number:       Current Level of Care: Hospital Recommended Level of Care: [place] Prior Approval Number:    Date Approved/Denied:   PASRR Number: [id number]  Discharge Plan: SNF    Current Diagnoses: Patient Active Problem List   Diagnosis Date Noted   Acute on chronic heart failure ([place]) 03/14/2021   Acute hypoxemic respiratory failure ([place]) 03/14/2021   Acute on chronic diastolic CHF (congestive heart failure) ([place]) 04/09/2020   Diabetes mellitus without complication ([place]) [id number]   HLD (hyperlipidemia) 04/09/2020   Depression 04/09/2020   Elevated troponin 04/09/2020   OSA (obstructive sleep apnea) 04/09/2020   Peripheral polyneuropathy 02/29/2020   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   Lumbar radiculopathy, acute    Acute midline low back pain without sciatica    Type 2 diabetes mellitus with diabetic neuropathy, without long-term current use of insulin ([place]) 02/21/2020   Hypothyroidism 02/21/2020   Morbid obesity with BMI of 50.0-59.9, adult ([place]) 02/21/2020   Severe sepsis ([place]) 02/21/2020   CAP (community acquired pneumonia) 02/21/2020   Bilateral cellulitis of lower leg 02/21/2020   Acute respiratory failure with hypoxia and hypercapnia ([place]) 02/21/2020   Acute renal failure superimposed on stage 3a chronic kidney disease ([place]) [id number]   Acute metabolic encephalopathy [id number]   Septic shock ([place]) 02/21/2020   Stage 3a chronic kidney disease ([place])  01/27/2020   Lymphedema of both lower extremities 08/21/2016   Benign essential hypertension 05/18/2016    Orientation RESPIRATION BLADDER Height & Weight     Self, Time, Situation, Place  O2 (NC3L) Incontinent Weight: (!) 315 lb 6.4 oz (143.1 kg) Height:  '5\' 4"'$  (162.6 cm)  BEHAVIORAL SYMPTOMS/MOOD NEUROLOGICAL BOWEL NUTRITION STATUS      Continent Diet (heart healthy/carb modified,Fluid consistency: Thin; Fluid restriction: 2000 mL Fluid)  AMBULATORY STATUS COMMUNICATION OF NEEDS Skin   Limited Assist Verbally Other (Comment) (open wound on right pretibital)                       Personal Care Assistance Level of Assistance  Bathing, Feeding, Dressing Bathing Assistance: Maximum assistance Feeding assistance: Limited assistance Dressing Assistance: Maximum assistance     Functional Limitations Info  Sight, Hearing, Speech Sight Info: Adequate Hearing Info: Adequate Speech Info: Adequate    SPECIAL CARE FACTORS FREQUENCY  PT (By licensed PT), OT (By licensed OT)     PT Frequency: 5x OT Frequency: 5x            Contractures Contractures Info: Not present    Additional Factors Info  Code Status, Allergies Code Status Info: full code Allergies Info: no known allergies           Current Medications (03/18/2021):  This is the current hospital active medication list Current Facility-Administered Medications  Medication Dose Route Frequency Provider Last Rate Last Admin   acetaminophen (TYLENOL) tablet 650 mg  650 mg Oral Q6H PRN Clarnce Flock, MD   650 mg at 03/18/21 [id number]   Or   acetaminophen (TYLENOL) suppository  650 mg  650 mg Rectal Q6H PRN Clarnce Flock, MD       acetaZOLAMIDE ER (DIAMOX) 12 hr capsule 500 mg  500 mg Oral Q12H Sharen Hones, MD   500 mg at 03/18/21 1450   aspirin EC tablet 81 mg  81 mg Oral Daily Clarnce Flock, MD   81 mg at 03/18/21 0816   aspirin-acetaminophen-caffeine ([place]) per tablet 1 tablet  1 tablet Oral  Q6H PRN Nolberto Hanlon, MD   1 tablet at 03/15/21 1345   atorvastatin (LIPITOR) tablet 20 mg  20 mg Oral Q2200 Clarnce Flock, MD   20 mg at 03/17/21 2111   buPROPion [facility] SR) 12 hr tablet 150 mg  150 mg Oral Daily Clarnce Flock, MD   150 mg at 03/18/21 0816   furosemide (LASIX) 200 mg in dextrose 5 % 100 mL (2 mg/mL) infusion  5 mg/hr Intravenous Continuous Nolberto Hanlon, MD 2.5 mL/hr at 03/18/21 0858 5 mg/hr at 03/18/21 0858   gabapentin (NEURONTIN) tablet 300 mg  300 mg Oral BID Nolberto Hanlon, MD   300 mg at 03/18/21 0816   guaiFENesin (MUCINEX) 12 hr tablet 600 mg  600 mg Oral BID Nolberto Hanlon, MD   600 mg at 03/18/21 0816   guaiFENesin-dextromethorphan (ROBITUSSIN DM) 100-10 MG/5ML syrup 5 mL  5 mL Oral Q4H PRN Nolberto Hanlon, MD   2.5 mL at 03/15/21 1628   hydrocerin (EUCERIN) cream   Topical BID Nolberto Hanlon, MD   Given at 03/18/21 0816   insulin aspart (novoLOG) injection 0-9 Units  0-9 Units Subcutaneous TID WC Clarnce Flock, MD       levothyroxine (SYNTHROID) tablet 250 mcg  250 mcg Oral Q0600 Clarnce Flock, MD   250 mcg at 03/18/21 0645   ondansetron [facility]) tablet 4 mg  4 mg Oral Q6H PRN Clarnce Flock, MD       Or   ondansetron [facility]) injection 4 mg  4 mg Intravenous Q6H PRN Clarnce Flock, MD       oxyCODONE (Oxy IR/ROXICODONE) immediate release tablet 5 mg  5 mg Oral Q4H PRN Clarnce Flock, MD   5 mg at 03/17/21 1321   polyethylene glycol (MIRALAX / GLYCOLAX) packet 17 g  17 g Oral Daily PRN Clarnce Flock, MD       rOPINIRole (REQUIP) tablet 3 mg  3 mg Oral TID Clarnce Flock, MD   3 mg at 03/18/21 0815   sodium chloride flush (NS) 0.9 % injection 3 mL  3 mL Intravenous Q12H Clarnce Flock, MD   3 mL at 03/18/21 [id number]     Discharge Medications: Please see discharge summary for a list of discharge medications.  Relevant Imaging Results:  Relevant Lab Results:   Additional Information [id number]  Gerrianne Scale Korin Hartwell,  LCSW

## 2021-03-18 NOTE — Progress Notes (Signed)
Physical Therapy Treatment Patient Details Name: Madison Mcbride MRN: [id number] DOB: 07-22-1955 Today's Date: 03/18/2021    History of Present Illness Pt admitted to [facility] on 03/14/21 for c/o SOB secondary to acute on chronic diastolic CHF exacerbation. Hypoxia at 81% improved with supplemental O2. Elevated troponin likely secondary to volume overload. Significant PMH includes: diastolic CHF, morbid obesity, hypothyroidism, T2DM, OSA, lymphedema, CKD. CXR significant for cardiomegaly with vascular congestion and mild edema.    PT Comments    Pt resting in bed upon PT arrival; agreeable to therapy session but therapy session limited d/t pt wanting to eat her dinner before it got cold (tray arrived during therapy session).  During session pt mod assist with bed mobility; min to mod assist to stand up to walker from elevated bed height; and CGA to sidestep a couple feet towards R along bed using RW.  Pt's O2 sats 90% or greater on 3 L O2 via nasal cannula during sessions activities.  Will continue to focus on strengthening, activity tolerance, and progressive functional mobility per pt tolerance.    Follow Up Recommendations  SNF;Supervision/Assistance - 24 hour     Equipment Recommendations  Rolling walker with 5" wheels;3in1 (PT);Wheelchair (measurements PT);Wheelchair cushion (measurements PT)    Recommendations for Other Services       Precautions / Restrictions Precautions Precautions: Fall Restrictions Weight Bearing Restrictions: No    Mobility  Bed Mobility Overal bed mobility: Needs Assistance Bed Mobility: Supine to Sit;Sit to Supine     Supine to sit: Mod assist;HOB elevated Sit to supine: Mod assist;HOB elevated   General bed mobility comments: assist for trunk semi-supine to sitting edge of bed; assist for B LE's sit to semi-supine in bed    Transfers Overall transfer level: Needs assistance Equipment used: Rolling walker (2 wheeled) Transfers: Sit to/from  Stand Sit to Stand: From elevated surface;Min assist;Mod assist         General transfer comment: assist to stand from elevated bed surface (use of momentum); vc's for UE placement  Ambulation/Gait Ambulation/Gait assistance: Min guard Gait Distance (Feet):  (sidestepped a couple feet towards R along bed) Assistive device: Rolling walker (2 wheeled)   Gait velocity: decreased   General Gait Details: increased effort to take steps   Stairs             Wheelchair Mobility    Modified Rankin (Stroke Patients Only)       Balance Overall balance assessment: Needs assistance Sitting-balance support: No upper extremity supported;Feet supported Sitting balance-Leahy Scale: Good Sitting balance - Comments: steady sitting reaching within BOS   Standing balance support: Bilateral upper extremity supported Standing balance-Leahy Scale: Poor Standing balance comment: pt initially unsteady standing requiring min assist to regain balance (UE support on RW)                            Cognition Arousal/Alertness: Awake/alert Behavior During Therapy: WFL for tasks assessed/performed Overall Cognitive Status: Within Functional Limits for tasks assessed                                        Exercises      General Comments General comments (skin integrity, edema, etc.): R LE with dressing in place.  Nursing cleared pt for participation in physical therapy.  Pt agreeable to PT session.      Pertinent  Vitals/Pain Pain Assessment: No/denies pain HR WFL during sessions activities.    Home Living                      Prior Function            PT Goals (current goals can now be found in the care plan section) Acute Rehab PT Goals Patient Stated Goal: "go home" PT Goal Formulation: With patient Time For Goal Achievement: 03/30/21 Potential to Achieve Goals: Fair Progress towards PT goals: Progressing toward goals    Frequency     Min 2X/week      PT Plan Current plan remains appropriate    Co-evaluation              AM-PAC PT "6 Clicks" Mobility   Outcome Measure  Help needed turning from your back to your side while in a flat bed without using bedrails?: A Lot Help needed moving from lying on your back to sitting on the side of a flat bed without using bedrails?: A Lot Help needed moving to and from a bed to a chair (including a wheelchair)?: A Little Help needed standing up from a chair using your arms (e.g., wheelchair or bedside chair)?: A Lot Help needed to walk in hospital room?: A Little Help needed climbing 3-5 steps with a railing? : A Lot 6 Click Score: 14    End of Session Equipment Utilized During Treatment: Gait belt;Oxygen (3 L O2 via nasal cannula) Activity Tolerance: Patient tolerated treatment well Patient left: in bed;with call bell/phone within reach;with bed alarm set;with family/visitor present Nurse Communication: Mobility status;Precautions PT Visit Diagnosis: Unsteadiness on feet (R26.81);Muscle weakness (generalized) (M62.81);Difficulty in walking, not elsewhere classified (R26.2)     Time: [id number] PT Time Calculation (min) (ACUTE ONLY): 23 min  Charges:  $Therapeutic Activity: 23-37 mins                    Leitha Bleak, PT 03/18/21, 4:56 PM

## 2021-03-18 NOTE — Progress Notes (Addendum)
[facility]  ROUNDING NOTE   Subjective:   Madison Mcbride is a 65 year old female with a past medical history of diastolic chronic heart failure, morbid obesity, diabetes, hypothyroidism, sleep apnea, and chronic kidney disease stage IIIa.  Patient was admitted to [facility] for Acute on chronic heart failure ([place]) [I50.9] Acute on chronic congestive heart failure, unspecified heart failure type ([place]) [I50.9]  Patient was seen resting in bed Alert and oriented Feels breathing has improved slightly   UOP 3L Lasix drip @ '5mg'$  /hr   Objective:  Vital signs in last 24 hours:  Temp:  [97.5 F (36.4 C)-98.2 F (36.8 C)] 97.9 F (36.6 C) (09/06 1137) Pulse Rate:  [65-76] 71 (09/06 1137) Resp:  [14-20] 14 (09/06 1137) BP: (116-137)/(67-83) 122/74 (09/06 1137) SpO2:  [92 %-100 %] 95 % (09/06 1137) Weight:  [143.1 kg] 143.1 kg (09/06 0618)  Weight change: -9.117 kg Filed Weights   03/16/21 0500 03/17/21 0405 03/18/21 0618  Weight: (!) 150.2 kg (!) 152.2 kg (!) 143.1 kg    Intake/Output: I/O last 3 completed shifts: In: 120 [P.O.:120] Out: 5900 [Urine:5900]   Intake/Output this shift:  Total I/O In: 243 [P.O.:240; I.V.:3] Out: 550 [Urine:550]  Physical Exam: General: NAD, laying in bed  Head: Normocephalic, atraumatic. Moist oral mucosal membranes  Eyes: Anicteric  Lungs:  Clear to auscultation, normal effort, [place] O2  Heart: Regular rate and rhythm  Abdomen:  Soft, nontender  Extremities:  Trace peripheral edema.  Neurologic: Nonfocal, moving all four extremities  Skin: No lesions       Basic Metabolic Panel: Recent Labs  Lab 03/14/21 1817 03/15/21 0527 03/16/21 0410 03/17/21 0442 03/18/21 0729  NA 140 142 142 140 141  K 4.2 4.2 4.7 4.2 3.9  CL 100 103 101 95* 91*  CO2 29 32 34* 39* 42*  GLUCOSE 92 87 90 84 94  BUN 34* 34* 35* 29* 24*  CREATININE 1.27* 1.34* 1.46* 1.16* 1.06*  CALCIUM 8.4* 8.1* 8.3* 8.3* 8.5*  MG  --   --   --   --  1.9     Liver Function Tests: Recent Labs  Lab 03/14/21 1817  AST 25  ALT 13  ALKPHOS 103  BILITOT 2.0*  PROT 7.4  ALBUMIN 3.1*   No results for input(s): LIPASE, AMYLASE in the last 168 hours. No results for input(s): AMMONIA in the last 168 hours.  CBC: Recent Labs  Lab 03/14/21 1817 03/15/21 0527  WBC 11.2* 10.6*  NEUTROABS 8.4*  --   HGB 13.2 12.4  HCT 45.8 43.8  MCV 83.4 83.9  PLT 429* 397    Cardiac Enzymes: No results for input(s): CKTOTAL, CKMB, CKMBINDEX, TROPONINI in the last 168 hours.  BNP: Invalid input(s): POCBNP  CBG: Recent Labs  Lab 03/17/21 1111 03/17/21 1601 03/17/21 2049 03/18/21 0726 03/18/21 1141  GLUCAP 134* 99 117* 94 120*    Microbiology: Results for orders placed or performed during the hospital encounter of 03/14/21  Resp Panel by RT-PCR (Flu A&B, Covid)     Status: None   Collection Time: 03/14/21  8:21 PM   Specimen: Nasopharyngeal(NP) swabs in vial transport medium  Result Value Ref Range Status   SARS Coronavirus 2 by RT PCR NEGATIVE NEGATIVE Final    Comment: (NOTE) SARS-CoV-2 target nucleic acids are NOT DETECTED.  The SARS-CoV-2 RNA is generally detectable in upper respiratory specimens during the acute phase of infection. The lowest concentration of SARS-CoV-2 viral copies this assay can detect is 138  copies/mL. A negative result does not preclude SARS-Cov-2 infection and should not be used as the sole basis for treatment or other patient management decisions. A negative result may occur with  improper specimen collection/handling, submission of specimen other than nasopharyngeal swab, presence of viral mutation(s) within the areas targeted by this assay, and inadequate number of viral copies(<138 copies/mL). A negative result must be combined with clinical observations, patient history, and epidemiological information. The expected result is Negative.  Fact Sheet for Patients:   EntrepreneurPulse.com.au  Fact Sheet for Healthcare Providers:  IncredibleEmployment.be  This test is no t yet approved or cleared by the [place] FDA and  has been authorized for detection and/or diagnosis of SARS-CoV-2 by FDA under an Emergency Use Authorization (EUA). This EUA will remain  in effect (meaning this test can be used) for the duration of the COVID-19 declaration under Section 564(b)(1) of the Act, 21 U.S.C.section 360bbb-3(b)(1), unless the authorization is terminated  or revoked sooner.       Influenza A by PCR NEGATIVE NEGATIVE Final   Influenza B by PCR NEGATIVE NEGATIVE Final    Comment: (NOTE) The Xpert Xpress SARS-CoV-2/FLU/RSV plus assay is intended as an aid in the diagnosis of influenza from Nasopharyngeal swab specimens and should not be used as a sole basis for treatment. Nasal washings and aspirates are unacceptable for Xpert Xpress SARS-CoV-2/FLU/RSV testing.  Fact Sheet for Patients: EntrepreneurPulse.com.au  Fact Sheet for Healthcare Providers: IncredibleEmployment.be  This test is not yet approved or cleared by the [place] FDA and has been authorized for detection and/or diagnosis of SARS-CoV-2 by FDA under an Emergency Use Authorization (EUA). This EUA will remain in effect (meaning this test can be used) for the duration of the COVID-19 declaration under Section 564(b)(1) of the Act, 21 U.S.C. section 360bbb-3(b)(1), unless the authorization is terminated or revoked.  Performed at [facility], [address] [place], [place] [address]     Coagulation Studies: No results for input(s): LABPROT, INR in the last 72 hours.  Urinalysis: No results for input(s): COLORURINE, LABSPEC, PHURINE, GLUCOSEU, HGBUR, BILIRUBINUR, KETONESUR, PROTEINUR, UROBILINOGEN, NITRITE, LEUKOCYTESUR in the last 72 hours.  Invalid input(s): APPERANCEUR     Imaging: ECHOCARDIOGRAM COMPLETE  Result Date: 03/17/2021    ECHOCARDIOGRAM REPORT   Patient Name:   Madison Mcbride Date of Exam: 03/16/2021 Medical Rec #:  [id number]             Height:       64.0 in Accession #:    [id number]            Weight:       331.1 lb Date of Birth:  [birth year]             BSA:          2.424 m Patient Age:    34 years              BP:           138/77 mmHg Patient Gender: F                     HR:           58 bpm. Exam Location:  [facility] Procedure: 2D Echo and Intracardiac Opacification Agent Indications:     CHF I50.31  History:         Patient has prior history of Echocardiogram examinations, most  recent 02/21/2020.  Sonographer:     Kathlen Brunswick RDCS Referring Phys:  [id number] [place] M ECKSTAT Diagnosing Phys: Serafina Royals MD  Sonographer Comments: Technically difficult study due to poor echo windows and patient is morbidly obese. Image acquisition challenging due to patient body habitus. IMPRESSIONS  1. Left ventricular ejection fraction, by estimation, is 45 to 50%. The left ventricle has mildly decreased function. The left ventricle has no regional wall motion abnormalities. Left ventricular diastolic parameters were normal.  2. Right ventricular systolic function is normal. The right ventricular size is normal.  3. The mitral valve is normal in structure. Trivial mitral valve regurgitation.  4. The aortic valve is normal in structure. Aortic valve regurgitation is not visualized. FINDINGS  Left Ventricle: Left ventricular ejection fraction, by estimation, is 45 to 50%. The left ventricle has mildly decreased function. The left ventricle has no regional wall motion abnormalities. Definity contrast agent was given IV to delineate the left ventricular endocardial borders. The left ventricular internal cavity size was normal in size. There is no left ventricular hypertrophy. Left ventricular diastolic parameters were normal. Right Ventricle: The right  ventricular size is normal. No increase in right ventricular wall thickness. Right ventricular systolic function is normal. Left Atrium: Left atrial size was normal in size. Right Atrium: Right atrial size was normal in size. Pericardium: There is no evidence of pericardial effusion. Mitral Valve: The mitral valve is normal in structure. Trivial mitral valve regurgitation. Tricuspid Valve: The tricuspid valve is normal in structure. Tricuspid valve regurgitation is trivial. Aortic Valve: The aortic valve is normal in structure. Aortic valve regurgitation is not visualized. Aortic regurgitation PHT measures 500 msec. Aortic valve peak gradient measures 7.0 mmHg. Pulmonic Valve: The pulmonic valve was normal in structure. Pulmonic valve regurgitation is not visualized. Aorta: The aortic root and ascending aorta are structurally normal, with no evidence of dilitation. IAS/Shunts: No atrial level shunt detected by color flow Doppler.  LEFT VENTRICLE PLAX 2D LVIDd:         6.10 cm  Diastology LVIDs:         4.70 cm  LV e' medial:    3.81 cm/s LV PW:         1.10 cm  LV E/e' medial:  24.6 LV IVS:        1.10 cm  LV e' lateral:   8.16 cm/s LVOT diam:     2.10 cm  LV E/e' lateral: 11.5 LV SV:         69 LV SV Index:   29 LVOT Area:     3.46 cm  RIGHT VENTRICLE RV Basal diam:  4.70 cm RV S prime:     10.80 cm/s TAPSE (M-mode): 2.2 cm LEFT ATRIUM           Index       RIGHT ATRIUM           Index LA diam:      3.00 cm 1.24 cm/m  RA Area:     17.20 cm LA Vol (A2C): 35.0 ml 14.44 ml/m RA Volume:   46.70 ml  19.27 ml/m LA Vol (A4C): 53.7 ml 22.15 ml/m  AORTIC VALVE                PULMONIC VALVE AV Area (Vmax): 2.38 cm    PV Vmax:       1.38 m/s AV Vmax:        132.00 cm/s PV Peak grad:  7.6 mmHg AV Peak Grad:  7.0 mmHg LVOT Vmax:      90.70 cm/s LVOT Vmean:     60.200 cm/s LVOT VTI:       0.200 m AI PHT:         500 msec  AORTA Ao Root diam: 5.00 cm Ao Asc diam:  3.70 cm MITRAL VALVE MV Area (PHT): 2.67 cm    SHUNTS MV  Decel Time: 284 msec    Systemic VTI:  0.20 m MV E velocity: 93.80 cm/s  Systemic Diam: 2.10 cm MV A velocity: 86.50 cm/s MV E/A ratio:  1.08 Serafina Royals MD Electronically signed by Serafina Royals MD Signature Date/Time: 03/17/2021/6:29:25 AM    Final      Medications:    furosemide (LASIX) 200 mg in dextrose 5% 100 mL ('2mg'$ /mL) infusion 5 mg/hr (03/18/21 0858)    acetaZOLAMIDE ER  500 mg Oral Q12H   aspirin EC  81 mg Oral Daily   atorvastatin  20 mg Oral Q2200   buPROPion  150 mg Oral Daily   gabapentin  300 mg Oral BID   guaiFENesin  600 mg Oral BID   hydrocerin   Topical BID   insulin aspart  0-9 Units Subcutaneous TID WC   levothyroxine  250 mcg Oral Q0600   rOPINIRole  3 mg Oral TID   sodium chloride flush  3 mL Intravenous Q12H   acetaminophen **OR** acetaminophen, aspirin-acetaminophen-caffeine, guaiFENesin-dextromethorphan, ondansetron **OR** ondansetron (ZOFRAN) IV, oxyCODONE, polyethylene glycol  Assessment/ Plan:  Ms. Madison Mcbride is a 65 y.o.  female with a past medical history of diastolic chronic heart failure, morbid obesity, diabetes, hypothyroidism, sleep apnea, and chronic kidney disease stage IIIa.  Patient was admitted to [facility] for Acute on chronic heart failure ([place]) [I50.9] Acute on chronic congestive heart failure, unspecified heart failure type ([place]) [I50.9]   Acute Kidney Injury on chronic kidney disease stage 3b/diabetes mellitus type 2 with chronic kidney disease with baseline GFR of 47 on 03/14/21. Renal US shows slightly enlarged kidneys with rt kidney cyst. Placed on Lasix drip yesterday with adequate urine output, 3L. Continue lasix drip to optimize fluid volume removal.  Lab Results  Component Value Date   CREATININE 1.06 (H) 03/18/2021   CREATININE 1.16 (H) 03/17/2021   CREATININE 1.46 (H) 03/16/2021    Intake/Output Summary (Last 24 hours) at 03/18/2021 1455 Last data filed at 03/18/2021 1051 Gross per 24 hour  Intake 363 ml  Output 2400 ml   Net -2037 ml   2. Acute failure. Ejection fraction 45-50%. Lasix drip      LOS: 4   9/6/20222:55 PM

## 2021-03-18 NOTE — Progress Notes (Signed)
PROGRESS NOTE    Madison Mcbride  [id number] DOB: 08/01/1955 DOA: 03/14/2021 PCP: Rusty Aus, MD   Chief complaint.  Shortness of breath. Brief Narrative:   Madison Mcbride is a 65 y.o. female with history of diastolic CHF, morbid obesity, hypothyroidism, type 2 diabetes, OSA, lymphedema, CKD, who presents with shortness of breath.[id number] with vascular congestion and mild edema.  She was a found to have acute on chronic diastolic congestive heart failure, her BNP was 1805, she was also required 3 L oxygen for hypoxemia.  She was placed on IV Lasix drip.     Assessment & Plan:   Principal Problem:   Acute on chronic diastolic CHF (congestive heart failure) (HCC) Active Problems:   Type 2 diabetes mellitus with diabetic neuropathy, without long-term current use of insulin (HCC)   Hypothyroidism   Morbid obesity with BMI of 50.0-59.9, adult (HCC)   OSA (obstructive sleep apnea)   Acute hypoxemic respiratory failure (HCC)  Acute on chronic systolic congestive heart failure  Acute hypoxemic respiratory failure. Elevated troponin secondary to congestive heart failure. Coronary artery disease. Reviewed echocardiogram, ejection fraction 45 to [id number], no diastolic dysfunction. Patient currently receiving IV Lasix drip, followed by cardiology. Renal function still getting better, she will benefit for additional day of IV Lasix. CO2 level going up, will add acetazolamide 500 mg twice a day. Continue monitor renal function and electrolytes per [facility] oxygen.   Acute kidney injury. Renal function improving after giving diuretics.  Continue to follow.  Type 2 diabetes. Continue current regimen.   DVT prophylaxis: Lovenox Code Status: full Family Communication:  Disposition Plan:    Status is: Inpatient  Remains inpatient appropriate because:IV treatments appropriate due to intensity of illness or inability to take PO and Inpatient level of care appropriate  due to severity of illness  Dispo: The patient is from: Home              Anticipated d/c is to: Home              Patient currently is not medically stable to d/c.   Difficult to place patient No        I/O last 3 completed shifts: In: 120 [P.O.:120] Out: 5900 [Urine:5900] Total I/O In: 243 [P.O.:240; I.V.:3] Out: 250 [Urine:250]     Consultants:  Cardiology  Procedures: None  Antimicrobials: None   Subjective: Patient feels better, still segment short of breath with minimal exertion.  Cough, nonproductive. Denies any chest pain or palpitation. No fever or chills. No dysuria hematuria No headache or dizziness. No abdominal pain or nausea vomiting, had a normal bowel movement yesterday.   Objective: Vitals:   03/17/21 2325 03/18/21 0336 03/18/21 0618 03/18/21 0716  BP: 116/67 122/71  120/83  Pulse: 74 76  70  Resp: '20 18  14  '$ Temp: 98.2 F (36.8 C) 98.2 F (36.8 C)  98.1 F (36.7 C)  TempSrc: Oral Oral    SpO2: 93% 96%  100%  Weight:   (!) 143.1 kg   Height:        Intake/Output Summary (Last 24 hours) at 03/18/2021 1047 Last data filed at 03/18/2021 0940 Gross per 24 hour  Intake 363 ml  Output 2100 ml  Net -1737 ml   Filed Weights   03/16/21 0500 03/17/21 0405 03/18/21 0618  Weight: (!) 150.2 kg (!) 152.2 kg (!) 143.1 kg    Examination:  General exam: Appears calm and comfortable, morbid obese Respiratory system: Clear to auscultation. Respiratory  effort normal. Cardiovascular system: S1 & S2 heard, RRR. No JVD, murmurs, rubs, gallops or clicks. No pedal edema. Gastrointestinal system: Abdomen is nondistended, soft and nontender. No organomegaly or masses felt. Normal bowel sounds heard. Central nervous system: Alert and oriented x3. No focal neurological deficits. Extremities: Symmetric 5 x 5 power. Skin: No rashes, lesions or ulcers Psychiatry: Judgement and insight appear normal. Mood & affect appropriate.     Data Reviewed: I have  personally reviewed following labs and imaging studies  CBC: Recent Labs  Lab 03/14/21 1817 03/15/21 0527  WBC 11.2* 10.6*  NEUTROABS 8.4*  --   HGB 13.2 12.4  HCT 45.8 43.8  MCV 83.4 83.9  PLT 429* [id number]   Basic Metabolic Panel: Recent Labs  Lab 03/14/21 1817 03/15/21 0527 03/16/21 0410 03/17/21 0442 03/18/21 0729  NA 140 142 142 140 141  K 4.2 4.2 4.7 4.2 3.9  CL 100 103 101 95* 91*  CO2 29 32 34* 39* 42*  GLUCOSE 92 87 90 84 94  BUN 34* 34* 35* 29* 24*  CREATININE 1.27* 1.34* 1.46* 1.16* 1.06*  CALCIUM 8.4* 8.1* 8.3* 8.3* 8.5*  MG  --   --   --   --  1.9   GFR: Estimated Creatinine Clearance: 74.3 mL/min (A) (by C-G formula based on SCr of 1.06 mg/dL (H)). Liver Function Tests: Recent Labs  Lab 03/14/21 1817  AST 25  ALT 13  ALKPHOS 103  BILITOT 2.0*  PROT 7.4  ALBUMIN 3.1*   No results for input(s): LIPASE, AMYLASE in the last 168 hours. No results for input(s): AMMONIA in the last 168 hours. Coagulation Profile: No results for input(s): INR, PROTIME in the last 168 hours. Cardiac Enzymes: No results for input(s): CKTOTAL, CKMB, CKMBINDEX, TROPONINI in the last 168 hours. BNP (last 3 results) No results for input(s): PROBNP in the last 8760 hours. HbA1C: No results for input(s): HGBA1C in the last 72 hours. CBG: Recent Labs  Lab 03/17/21 0722 03/17/21 1111 03/17/21 1601 03/17/21 2049 03/18/21 0726  GLUCAP 106* 134* 99 117* 94   Lipid Profile: No results for input(s): CHOL, HDL, LDLCALC, TRIG, CHOLHDL, LDLDIRECT in the last 72 hours. Thyroid Function Tests: No results for input(s): TSH, T4TOTAL, FREET4, T3FREE, THYROIDAB in the last 72 hours. Anemia Panel: No results for input(s): VITAMINB12, FOLATE, FERRITIN, TIBC, IRON, RETICCTPCT in the last 72 hours. Sepsis Labs: Recent Labs  Lab 03/14/21 1811  LATICACIDVEN 2.5*    Recent Results (from the past 240 hour(s))  Resp Panel by RT-PCR (Flu A&B, Covid)     Status: None   Collection Time:  03/14/21  8:21 PM   Specimen: Nasopharyngeal(NP) swabs in vial transport medium  Result Value Ref Range Status   SARS Coronavirus 2 by RT PCR NEGATIVE NEGATIVE Final    Comment: (NOTE) SARS-CoV-2 target nucleic acids are NOT DETECTED.  The SARS-CoV-2 RNA is generally detectable in upper respiratory specimens during the acute phase of infection. The lowest concentration of SARS-CoV-2 viral copies this assay can detect is 138 copies/mL. A negative result does not preclude SARS-Cov-2 infection and should not be used as the sole basis for treatment or other patient management decisions. A negative result may occur with  improper specimen collection/handling, submission of specimen other than nasopharyngeal swab, presence of viral mutation(s) within the areas targeted by this assay, and inadequate number of viral copies(<138 copies/mL). A negative result must be combined with clinical observations, patient history, and epidemiological information. The expected result is Negative.  Fact Sheet for Patients:  EntrepreneurPulse.com.au  Fact Sheet for Healthcare Providers:  IncredibleEmployment.be  This test is no t yet approved or cleared by the [place] FDA and  has been authorized for detection and/or diagnosis of SARS-CoV-2 by FDA under an Emergency Use Authorization (EUA). This EUA will remain  in effect (meaning this test can be used) for the duration of the COVID-19 declaration under Section 564(b)(1) of the Act, 21 U.S.C.section 360bbb-3(b)(1), unless the authorization is terminated  or revoked sooner.       Influenza A by PCR NEGATIVE NEGATIVE Final   Influenza B by PCR NEGATIVE NEGATIVE Final    Comment: (NOTE) The Xpert Xpress SARS-CoV-2/FLU/RSV plus assay is intended as an aid in the diagnosis of influenza from Nasopharyngeal swab specimens and should not be used as a sole basis for treatment. Nasal washings and aspirates are  unacceptable for Xpert Xpress SARS-CoV-2/FLU/RSV testing.  Fact Sheet for Patients: EntrepreneurPulse.com.au  Fact Sheet for Healthcare Providers: IncredibleEmployment.be  This test is not yet approved or cleared by the [place] FDA and has been authorized for detection and/or diagnosis of SARS-CoV-2 by FDA under an Emergency Use Authorization (EUA). This EUA will remain in effect (meaning this test can be used) for the duration of the COVID-19 declaration under Section 564(b)(1) of the Act, 21 U.S.C. section 360bbb-3(b)(1), unless the authorization is terminated or revoked.  Performed at [facility], [address] [place], [place] [address]          Radiology Studies: ECHOCARDIOGRAM COMPLETE  Result Date: 03/17/2021    ECHOCARDIOGRAM REPORT   Patient Name:   JAIMYA WANKO Date of Exam: 03/16/2021 Medical Rec #:  [id number]             Height:       64.0 in Accession #:    [id number]            Weight:       331.1 lb Date of Birth:  04/04/1956             BSA:          2.424 m Patient Age:    40 years              BP:           138/77 mmHg Patient Gender: F                     HR:           58 bpm. Exam Location:  [facility] Procedure: 2D Echo and Intracardiac Opacification Agent Indications:     CHF I50.31  History:         Patient has prior history of Echocardiogram examinations, most                  recent 02/21/2020.  Sonographer:     Kathlen Brunswick RDCS Referring Phys:  [id number] [place] M ECKSTAT Diagnosing Phys: Serafina Royals MD  Sonographer Comments: Technically difficult study due to poor echo windows and patient is morbidly obese. Image acquisition challenging due to patient body habitus. IMPRESSIONS  1. Left ventricular ejection fraction, by estimation, is 45 to 50%. The left ventricle has mildly decreased function. The left ventricle has no regional wall motion abnormalities. Left ventricular diastolic parameters were  normal.  2. Right ventricular systolic function is normal. The right ventricular size is normal.  3. The mitral valve is normal in structure. Trivial mitral valve regurgitation.  4.  The aortic valve is normal in structure. Aortic valve regurgitation is not visualized. FINDINGS  Left Ventricle: Left ventricular ejection fraction, by estimation, is 45 to 50%. The left ventricle has mildly decreased function. The left ventricle has no regional wall motion abnormalities. Definity contrast agent was given IV to delineate the left ventricular endocardial borders. The left ventricular internal cavity size was normal in size. There is no left ventricular hypertrophy. Left ventricular diastolic parameters were normal. Right Ventricle: The right ventricular size is normal. No increase in right ventricular wall thickness. Right ventricular systolic function is normal. Left Atrium: Left atrial size was normal in size. Right Atrium: Right atrial size was normal in size. Pericardium: There is no evidence of pericardial effusion. Mitral Valve: The mitral valve is normal in structure. Trivial mitral valve regurgitation. Tricuspid Valve: The tricuspid valve is normal in structure. Tricuspid valve regurgitation is trivial. Aortic Valve: The aortic valve is normal in structure. Aortic valve regurgitation is not visualized. Aortic regurgitation PHT measures 500 msec. Aortic valve peak gradient measures 7.0 mmHg. Pulmonic Valve: The pulmonic valve was normal in structure. Pulmonic valve regurgitation is not visualized. Aorta: The aortic root and ascending aorta are structurally normal, with no evidence of dilitation. IAS/Shunts: No atrial level shunt detected by color flow Doppler.  LEFT VENTRICLE PLAX 2D LVIDd:         6.10 cm  Diastology LVIDs:         4.70 cm  LV e' medial:    3.81 cm/s LV PW:         1.10 cm  LV E/e' medial:  24.6 LV IVS:        1.10 cm  LV e' lateral:   8.16 cm/s LVOT diam:     2.10 cm  LV E/e' lateral: 11.5 LV SV:          69 LV SV Index:   29 LVOT Area:     3.46 cm  RIGHT VENTRICLE RV Basal diam:  4.70 cm RV S prime:     10.80 cm/s TAPSE (M-mode): 2.2 cm LEFT ATRIUM           Index       RIGHT ATRIUM           Index LA diam:      3.00 cm 1.24 cm/m  RA Area:     17.20 cm LA Vol (A2C): 35.0 ml 14.44 ml/m RA Volume:   46.70 ml  19.27 ml/m LA Vol (A4C): 53.7 ml 22.15 ml/m  AORTIC VALVE                PULMONIC VALVE AV Area (Vmax): 2.38 cm    PV Vmax:       1.38 m/s AV Vmax:        132.00 cm/s PV Peak grad:  7.6 mmHg AV Peak Grad:   7.0 mmHg LVOT Vmax:      90.70 cm/s LVOT Vmean:     60.200 cm/s LVOT VTI:       0.200 m AI PHT:         500 msec  AORTA Ao Root diam: 5.00 cm Ao Asc diam:  3.70 cm MITRAL VALVE MV Area (PHT): 2.67 cm    SHUNTS MV Decel Time: 284 msec    Systemic VTI:  0.20 m MV E velocity: 93.80 cm/s  Systemic Diam: 2.10 cm MV A velocity: 86.50 cm/s MV E/A ratio:  1.08 Serafina Royals MD Electronically signed by Serafina Royals MD Signature Date/Time:  03/17/2021/6:29:25 AM    Final         Scheduled Meds:  acetaZOLAMIDE ER  500 mg Oral Q12H   aspirin EC  81 mg Oral Daily   atorvastatin  20 mg Oral Q2200   buPROPion  150 mg Oral Daily   gabapentin  300 mg Oral BID   guaiFENesin  600 mg Oral BID   hydrocerin   Topical BID   insulin aspart  0-9 Units Subcutaneous TID WC   levothyroxine  250 mcg Oral Q0600   rOPINIRole  3 mg Oral TID   sodium chloride flush  3 mL Intravenous Q12H   Continuous Infusions:  furosemide (LASIX) 200 mg in dextrose 5% 100 mL ('2mg'$ /mL) infusion 5 mg/hr (03/18/21 0858)     LOS: 4 days    Time spent: 32 minutes    Sharen Hones, MD [facility]   To contact the attending provider between 7A-7P or the covering provider during after hours 7P-7A, please log into the web site [email or web address] and access using universal [place] password for that web site. If you do not have the password, please call the hospital operator.  03/18/2021, 10:47 AM

## 2021-03-18 NOTE — Care Management Important Message (Signed)
Important Message  Patient Details  Name: Madison Mcbride MRN: [id number] Date of Birth: 05/16/1956   Medicare Important Message Given:  Yes     [name] A [place] 03/18/2021, 3:05 PM

## 2021-03-19 DIAGNOSIS — I5023 Acute on chronic systolic (congestive) heart failure: Secondary | ICD-10-CM

## 2021-03-19 DIAGNOSIS — I5022 Chronic systolic (congestive) heart failure: Secondary | ICD-10-CM

## 2021-03-19 LAB — BASIC METABOLIC PANEL
Anion gap: 10 (ref 5–15)
BUN: 23 mg/dL (ref 8–23)
CO2: 39 mmol/L — ABNORMAL HIGH (ref 22–32)
Calcium: 8.7 mg/dL — ABNORMAL LOW (ref 8.9–10.3)
Chloride: 92 mmol/L — ABNORMAL LOW (ref 98–111)
Creatinine, Ser: 1.1 mg/dL — ABNORMAL HIGH (ref 0.44–1.00)
GFR, Estimated: 55 mL/min — ABNORMAL LOW (ref >60)
Glucose, Bld: 107 mg/dL — ABNORMAL HIGH (ref 70–99)
Potassium: 3.5 mmol/L (ref 3.5–5.1)
Sodium: 141 mmol/L (ref 135–145)

## 2021-03-19 LAB — GLUCOSE, CAPILLARY
Glucose-Capillary: 99 mg/dL (ref 70–99)
Glucose-Capillary: 153 mg/dL — ABNORMAL HIGH (ref 70–99)
Glucose-Capillary: 121 mg/dL — ABNORMAL HIGH (ref 70–99)
Glucose-Capillary: 103 mg/dL — ABNORMAL HIGH (ref 70–99)

## 2021-03-19 LAB — MAGNESIUM: Magnesium: 2 mg/dL (ref 1.7–2.4)

## 2021-03-19 MED ORDER — POTASSIUM CHLORIDE CRYS ER 20 MEQ PO TBCR
40.0000 meq | EXTENDED_RELEASE_TABLET | Freq: Once | ORAL | Status: AC
  Administered 2021-03-19: 40 meq via ORAL
  Filled 2021-03-19: qty 4

## 2021-03-19 MED ORDER — CARVEDILOL 3.125 MG PO TABS: 3.1250 mg | ORAL_TABLET | Freq: Two times a day (BID) | ORAL | Status: DC

## 2021-03-19 MED ORDER — TORSEMIDE 20 MG PO TABS
40.0000 mg | ORAL_TABLET | Freq: Every day | ORAL | Status: DC
  Administered 2021-03-19 – 2021-03-21 (×3): 40 mg via ORAL
  Filled 2021-03-19 (×3): qty 2

## 2021-03-19 MED ORDER — CARVEDILOL 6.25 MG PO TABS
6.2500 mg | ORAL_TABLET | Freq: Two times a day (BID) | ORAL | Status: DC
  Administered 2021-03-19 – 2021-03-24 (×10): 6.25 mg via ORAL
  Filled 2021-03-19 (×10): qty 1

## 2021-03-19 NOTE — Consult Note (Signed)
   Heart Failure Nurse Navigator Note  HFmrEF 45 to 50%.  Previously reported at 50 to 55%.   Patient presented to the emergency room with what she thought was UTI and was found to be hypoxic.  Chest x-ray revealed vascular congestion and mild pulmonary edema.  BNP was elevated at 1805.   Comorbidities:  Morbid obesity Diabetes Obstructive sleep apnea Chronic kidney disease Hypertension Hyperlipidemia  Labs:  Sodium 141, potassium 3.5, chloride 92, CO2 39, BUN 23, creatinine 1.10 Intake 628 mL Output 2670 mL  Medications:  Aspirin 81 mg daily Atorvastatin 20 mg daily Torsemide 40 mg daily Coreg 3.125 mg twice daily with meals   Initial meeting with patient today.  She had just finished working with physical therapy and was sitting up in the chair.  She remains on O2 per nasal cannula.  She denied any worsening shortness of breath.  She states at home that her activity includes walking around the house and doing some household chores but does not go  grocery shopping or go out for taking a walk outside.  She fixes most of the meals, discussed choosing low-sodium foods, using fresh and frozen vegetables.  She admits to using some salt at the table, discussed completely removing the saltshaker from the table and using other ways to seasoning her food such as Mrs. Dash.  By teach back method discussed weighing daily and what to report to physician.  She has follow-up appointment in the outpatient heart failure clinic on September 13 at 1 PM.  Plans are for right now she is to be discharged to a SNF facility.  She was given a living with heart failure teaching booklet, zone magnet, dietary and fall on low-sodium diet and taking charge of your heart failure handout.  Will continue to follow.  Pricilla Riffle RN [facility]

## 2021-03-19 NOTE — Progress Notes (Addendum)
[facility]  ROUNDING NOTE   Subjective:   Madison Mcbride is a 65 year old female with a past medical history of diastolic chronic heart failure, morbid obesity, diabetes, hypothyroidism, sleep apnea, and chronic kidney disease stage IIIa.  Patient was admitted to [facility] for Acute on chronic heart failure ([place]) [I50.9] Acute on chronic congestive heart failure, unspecified heart failure type ([place]) [I50.9]  Patient laying in bed, doing exercises with PT States she feels her breathing has improved Edema reduced also in legs Patient seen later sitting up in chair  UOP 2.6L  Objective:  Vital signs in last 24 hours:  Temp:  [98 F (36.7 C)-98.6 F (37 C)] 98.2 F (36.8 C) (09/07 1132) Pulse Rate:  [66-85] 77 (09/07 1132) Resp:  [14-20] 17 (09/07 1134) BP: (128-148)/(60-80) 133/75 (09/07 1132) SpO2:  [89 %-99 %] 99 % (09/07 1132) Weight:  [139.8 kg] 139.8 kg (09/07 0405)  Weight change: -3.264 kg Filed Weights   03/17/21 0405 03/18/21 0618 03/19/21 0405  Weight: (!) 152.2 kg (!) 143.1 kg (!) 139.8 kg    Intake/Output: I/O last 3 completed shifts: In: 628.1 [P.O.:480; I.V.:148.1] Out: 4520 [Urine:4520]   Intake/Output this shift:  Total I/O In: 10.3 [I.V.:10.3] Out: 400 [Urine:400]  Physical Exam: General: NAD, sitting up in chair  Head: Normocephalic, atraumatic. Moist oral mucosal membranes  Eyes: Anicteric  Lungs:  Clear to auscultation, normal effort, [place] O2  Heart: Regular rate and rhythm  Abdomen:  Soft, nontender  Extremities:  Trace peripheral edema  Neurologic: Nonfocal, moving all four extremities  Skin: No lesions, erythema bilat lower legs       Basic Metabolic Panel: Recent Labs  Lab 03/15/21 0527 03/16/21 0410 03/17/21 0442 03/18/21 0729 03/19/21 0245  NA 142 142 140 141 141  K 4.2 4.7 4.2 3.9 3.5  CL 103 101 95* 91* 92*  CO2 32 34* 39* 42* 39*  GLUCOSE 87 90 84 94 107*  BUN 34* 35* 29* 24* 23  CREATININE 1.34* 1.46*  1.16* 1.06* 1.10*  CALCIUM 8.1* 8.3* 8.3* 8.5* 8.7*  MG  --   --   --  1.9 2.0     Liver Function Tests: Recent Labs  Lab 03/14/21 1817  AST 25  ALT 13  ALKPHOS 103  BILITOT 2.0*  PROT 7.4  ALBUMIN 3.1*    No results for input(s): LIPASE, AMYLASE in the last 168 hours. No results for input(s): AMMONIA in the last 168 hours.  CBC: Recent Labs  Lab 03/14/21 1817 03/15/21 0527  WBC 11.2* 10.6*  NEUTROABS 8.4*  --   HGB 13.2 12.4  HCT 45.8 43.8  MCV 83.4 83.9  PLT 429* 397     Cardiac Enzymes: No results for input(s): CKTOTAL, CKMB, CKMBINDEX, TROPONINI in the last 168 hours.  BNP: Invalid input(s): POCBNP  CBG: Recent Labs  Lab 03/18/21 1141 03/18/21 1532 03/18/21 2050 03/19/21 0744 03/19/21 1131  GLUCAP 120* 109* 96 99 153*     Microbiology: Results for orders placed or performed during the hospital encounter of 03/14/21  Resp Panel by RT-PCR (Flu A&B, Covid)     Status: None   Collection Time: 03/14/21  8:21 PM   Specimen: Nasopharyngeal(NP) swabs in vial transport medium  Result Value Ref Range Status   SARS Coronavirus 2 by RT PCR NEGATIVE NEGATIVE Final    Comment: (NOTE) SARS-CoV-2 target nucleic acids are NOT DETECTED.  The SARS-CoV-2 RNA is generally detectable in upper respiratory specimens during the acute phase of infection.  The lowest concentration of SARS-CoV-2 viral copies this assay can detect is 138 copies/mL. A negative result does not preclude SARS-Cov-2 infection and should not be used as the sole basis for treatment or other patient management decisions. A negative result may occur with  improper specimen collection/handling, submission of specimen other than nasopharyngeal swab, presence of viral mutation(s) within the areas targeted by this assay, and inadequate number of viral copies(<138 copies/mL). A negative result must be combined with clinical observations, patient history, and epidemiological information. The expected  result is Negative.  Fact Sheet for Patients:  EntrepreneurPulse.com.au  Fact Sheet for Healthcare Providers:  IncredibleEmployment.be  This test is no t yet approved or cleared by the [place] FDA and  has been authorized for detection and/or diagnosis of SARS-CoV-2 by FDA under an Emergency Use Authorization (EUA). This EUA will remain  in effect (meaning this test can be used) for the duration of the COVID-19 declaration under Section 564(b)(1) of the Act, 21 U.S.C.section 360bbb-3(b)(1), unless the authorization is terminated  or revoked sooner.       Influenza A by PCR NEGATIVE NEGATIVE Final   Influenza B by PCR NEGATIVE NEGATIVE Final    Comment: (NOTE) The Xpert Xpress SARS-CoV-2/FLU/RSV plus assay is intended as an aid in the diagnosis of influenza from Nasopharyngeal swab specimens and should not be used as a sole basis for treatment. Nasal washings and aspirates are unacceptable for Xpert Xpress SARS-CoV-2/FLU/RSV testing.  Fact Sheet for Patients: EntrepreneurPulse.com.au  Fact Sheet for Healthcare Providers: IncredibleEmployment.be  This test is not yet approved or cleared by the [place] FDA and has been authorized for detection and/or diagnosis of SARS-CoV-2 by FDA under an Emergency Use Authorization (EUA). This EUA will remain in effect (meaning this test can be used) for the duration of the COVID-19 declaration under Section 564(b)(1) of the Act, 21 U.S.C. section 360bbb-3(b)(1), unless the authorization is terminated or revoked.  Performed at [facility], [address] [place], [place] [address]     Coagulation Studies: No results for input(s): LABPROT, INR in the last 72 hours.  Urinalysis: No results for input(s): COLORURINE, LABSPEC, PHURINE, GLUCOSEU, HGBUR, BILIRUBINUR, KETONESUR, PROTEINUR, UROBILINOGEN, NITRITE, LEUKOCYTESUR in the last 72  hours.  Invalid input(s): APPERANCEUR    Imaging: No results found.   Medications:      acetaZOLAMIDE ER  500 mg Oral Q12H   aspirin EC  81 mg Oral Daily   atorvastatin  20 mg Oral Q2200   buPROPion  150 mg Oral Daily   carvedilol  6.25 mg Oral BID WC   gabapentin  300 mg Oral BID   guaiFENesin  600 mg Oral BID   hydrocerin   Topical BID   insulin aspart  0-9 Units Subcutaneous TID WC   levothyroxine  250 mcg Oral Q0600   rOPINIRole  3 mg Oral TID   sodium chloride flush  3 mL Intravenous Q12H   torsemide  40 mg Oral Daily   acetaminophen **OR** acetaminophen, aspirin-acetaminophen-caffeine, guaiFENesin-dextromethorphan, ondansetron **OR** ondansetron (ZOFRAN) IV, oxyCODONE, polyethylene glycol  Assessment/ Plan:  Ms. Madison Mcbride is a 65 y.o.  female with a past medical history of diastolic chronic heart failure, morbid obesity, diabetes, hypothyroidism, sleep apnea, and chronic kidney disease stage IIIa.  Patient was admitted to [facility] for Acute on chronic heart failure ([place]) [I50.9] Acute on chronic congestive heart failure, unspecified heart failure type ([place]) [I50.9]   Acute Kidney Injury on chronic kidney disease stage 3b/diabetes mellitus type  2 with chronic kidney disease with baseline GFR of 47 on 03/14/21. Renal US shows slightly enlarged kidneys with rt kidney cyst. Creatinine stable and eGFR improved past baseline. Lasix drip discontinued and will start oral diuretic Will need outpatient follow up with our office discharge  Lab Results  Component Value Date   CREATININE 1.10 (H) 03/19/2021   CREATININE 1.06 (H) 03/18/2021   CREATININE 1.16 (H) 03/17/2021    Intake/Output Summary (Last 24 hours) at 03/19/2021 1331 Last data filed at 03/19/2021 0807 Gross per 24 hour  Intake 395.37 ml  Output 2520 ml  Net -2124.63 ml    2. Acute on chronic diastolic heart failure. Ejection fraction 45-50%. Lasix drip discontinued. Will prescribe Torsemide 50m  daily     LOS: 5   9/7/20221:31 PM

## 2021-03-19 NOTE — Progress Notes (Signed)
PROGRESS NOTE    Madison Mcbride  [id number] DOB: Aug 24, 1955 DOA: 03/14/2021 PCP: Rusty Aus, MD   Chief complaint.  Shortness of breath. Brief Narrative:   Madison Mcbride is a 65 y.o. female with history of diastolic CHF, morbid obesity, hypothyroidism, type 2 diabetes, OSA, lymphedema, CKD, who presents with shortness of breath.[id number] with vascular congestion and mild edema.  She was a found to have acute on chronic diastolic congestive heart failure, her BNP was 1805, she was also required 3 L oxygen for hypoxemia.  She was placed on IV Lasix drip. Condition had improved, transitioned diuretics to oral on 9/7.  Echocardiogram showed ejection fraction 45 to 50%. she is medically stable to be discharged, pending nursing home placement.  Assessment & Plan:   Principal Problem:   Acute on chronic diastolic CHF (congestive heart failure) (HCC) Active Problems:   Type 2 diabetes mellitus with diabetic neuropathy, without long-term current use of insulin (HCC)   Hypothyroidism   Morbid obesity with BMI of 50.0-59.9, adult (HCC)   OSA (obstructive sleep apnea)   Acute hypoxemic respiratory failure (HCC)  Acute on chronic systolic congestive heart failure  Acute hypoxemic respiratory failure. Elevated troponin secondary to congestive heart failure. Coronary artery disease. Patient condition had improved, edema much improved.  I will discontinue IV Lasix, continue oral diuretics with torsemide and acetazolamide. Replete potassium with a potassium level of 3.5. Added beta-blocker.  Acute kidney injury. Renal function slightly off today, but still stable. Change diuretics to oral pain  Type 2 diabetes Continue current regimen.   DVT prophylaxis: Lovenox Code Status: full Family Communication:  Disposition Plan:    Status is: Inpatient  Remains inpatient appropriate because:Unsafe d/c plan  Dispo: The patient is from: Home              Anticipated  d/c is to: SNF              Patient currently is medically stable to d/c.   Difficult to place patient No        I/O last 3 completed shifts: In: 628.1 [P.O.:480; I.V.:148.1] Out: 4520 [Urine:4520] Total I/O In: 10.3 [I.V.:10.3] Out: 400 [Urine:400]     Consultants:  Cardiology  Procedures: None  Antimicrobials: None  Subjective: Patient feels much better today.  On 3 L oxygen with good saturation. Not short of breath today, still has chronic cough, nonproductive. No fever or chills  No dysuria hematuria  No chest pain palpitation.  Objective: Vitals:   03/18/21 1930 03/18/21 2343 03/19/21 0405 03/19/21 0750  BP: 128/73 130/68 130/60 131/80  Pulse: 76 73 70 85  Resp: '16 20 20 20  '$ Temp: 98 F (36.7 C) 98.2 F (36.8 C) 98.6 F (37 C) 98 F (36.7 C)  TempSrc: Oral Oral    SpO2: 96% (!) 89% 99% 95%  Weight:   (!) 139.8 kg   Height:        Intake/Output Summary (Last 24 hours) at 03/19/2021 0945 Last data filed at 03/19/2021 [id number] Gross per 24 hour  Intake 395.37 ml  Output 2820 ml  Net -2424.63 ml   Filed Weights   03/17/21 0405 03/18/21 0618 03/19/21 0405  Weight: (!) 152.2 kg (!) 143.1 kg (!) 139.8 kg    Examination:  General exam: Appears calm and comfortable, morbid obese. Respiratory system: Clear to auscultation. Respiratory effort normal. Cardiovascular system: S1 & S2 heard, RRR. No JVD, murmurs, rubs, gallops or clicks.  Gastrointestinal system: Abdomen is nondistended, soft and  nontender. No organomegaly or masses felt. Normal bowel sounds heard. Central nervous system: Alert and oriented. No focal neurological deficits. Extremities: Edema essentially resolved. Skin: No rashes, lesions or ulcers Psychiatry: Judgement and insight appear normal. Mood & affect appropriate.     Data Reviewed: I have personally reviewed following labs and imaging studies  CBC: Recent Labs  Lab 03/14/21 1817 03/15/21 0527  WBC 11.2* 10.6*  NEUTROABS 8.4*   --   HGB 13.2 12.4  HCT 45.8 43.8  MCV 83.4 83.9  PLT 429* [id number]   Basic Metabolic Panel: Recent Labs  Lab 03/15/21 0527 03/16/21 0410 03/17/21 0442 03/18/21 0729 03/19/21 0245  NA 142 142 140 141 141  K 4.2 4.7 4.2 3.9 3.5  CL 103 101 95* 91* 92*  CO2 32 34* 39* 42* 39*  GLUCOSE 87 90 84 94 107*  BUN 34* 35* 29* 24* 23  CREATININE 1.34* 1.46* 1.16* 1.06* 1.10*  CALCIUM 8.1* 8.3* 8.3* 8.5* 8.7*  MG  --   --   --  1.9 2.0   GFR: Estimated Creatinine Clearance: 70.4 mL/min (A) (by C-G formula based on SCr of 1.1 mg/dL (H)). Liver Function Tests: Recent Labs  Lab 03/14/21 1817  AST 25  ALT 13  ALKPHOS 103  BILITOT 2.0*  PROT 7.4  ALBUMIN 3.1*   No results for input(s): LIPASE, AMYLASE in the last 168 hours. No results for input(s): AMMONIA in the last 168 hours. Coagulation Profile: No results for input(s): INR, PROTIME in the last 168 hours. Cardiac Enzymes: No results for input(s): CKTOTAL, CKMB, CKMBINDEX, TROPONINI in the last 168 hours. BNP (last 3 results) No results for input(s): PROBNP in the last 8760 hours. HbA1C: No results for input(s): HGBA1C in the last 72 hours. CBG: Recent Labs  Lab 03/18/21 0726 03/18/21 1141 03/18/21 1532 03/18/21 2050 03/19/21 0744  GLUCAP 94 120* 109* 96 99   Lipid Profile: No results for input(s): CHOL, HDL, LDLCALC, TRIG, CHOLHDL, LDLDIRECT in the last 72 hours. Thyroid Function Tests: No results for input(s): TSH, T4TOTAL, FREET4, T3FREE, THYROIDAB in the last 72 hours. Anemia Panel: No results for input(s): VITAMINB12, FOLATE, FERRITIN, TIBC, IRON, RETICCTPCT in the last 72 hours. Sepsis Labs: Recent Labs  Lab 03/14/21 1811  LATICACIDVEN 2.5*    Recent Results (from the past 240 hour(s))  Resp Panel by RT-PCR (Flu A&B, Covid)     Status: None   Collection Time: 03/14/21  8:21 PM   Specimen: Nasopharyngeal(NP) swabs in vial transport medium  Result Value Ref Range Status   SARS Coronavirus 2 by RT PCR  NEGATIVE NEGATIVE Final    Comment: (NOTE) SARS-CoV-2 target nucleic acids are NOT DETECTED.  The SARS-CoV-2 RNA is generally detectable in upper respiratory specimens during the acute phase of infection. The lowest concentration of SARS-CoV-2 viral copies this assay can detect is 138 copies/mL. A negative result does not preclude SARS-Cov-2 infection and should not be used as the sole basis for treatment or other patient management decisions. A negative result may occur with  improper specimen collection/handling, submission of specimen other than nasopharyngeal swab, presence of viral mutation(s) within the areas targeted by this assay, and inadequate number of viral copies(<138 copies/mL). A negative result must be combined with clinical observations, patient history, and epidemiological information. The expected result is Negative.  Fact Sheet for Patients:  EntrepreneurPulse.com.au  Fact Sheet for Healthcare Providers:  IncredibleEmployment.be  This test is no t yet approved or cleared by the [place] and  has been authorized for detection and/or diagnosis of SARS-CoV-2 by FDA under an Emergency Use Authorization (EUA). This EUA will remain  in effect (meaning this test can be used) for the duration of the COVID-19 declaration under Section 564(b)(1) of the Act, 21 U.S.C.section 360bbb-3(b)(1), unless the authorization is terminated  or revoked sooner.       Influenza A by PCR NEGATIVE NEGATIVE Final   Influenza B by PCR NEGATIVE NEGATIVE Final    Comment: (NOTE) The Xpert Xpress SARS-CoV-2/FLU/RSV plus assay is intended as an aid in the diagnosis of influenza from Nasopharyngeal swab specimens and should not be used as a sole basis for treatment. Nasal washings and aspirates are unacceptable for Xpert Xpress SARS-CoV-2/FLU/RSV testing.  Fact Sheet for Patients: EntrepreneurPulse.com.au  Fact Sheet for  Healthcare Providers: IncredibleEmployment.be  This test is not yet approved or cleared by the [place] FDA and has been authorized for detection and/or diagnosis of SARS-CoV-2 by FDA under an Emergency Use Authorization (EUA). This EUA will remain in effect (meaning this test can be used) for the duration of the COVID-19 declaration under Section 564(b)(1) of the Act, 21 U.S.C. section 360bbb-3(b)(1), unless the authorization is terminated or revoked.  Performed at [facility], [address] [place], [place] [address]          Radiology Studies: No results found.      Scheduled Meds:  acetaZOLAMIDE ER  500 mg Oral Q12H   aspirin EC  81 mg Oral Daily   atorvastatin  20 mg Oral Q2200   buPROPion  150 mg Oral Daily   gabapentin  300 mg Oral BID   guaiFENesin  600 mg Oral BID   hydrocerin   Topical BID   insulin aspart  0-9 Units Subcutaneous TID WC   levothyroxine  250 mcg Oral Q0600   rOPINIRole  3 mg Oral TID   sodium chloride flush  3 mL Intravenous Q12H   torsemide  40 mg Oral Daily   Continuous Infusions:   LOS: 5 days    Time spent: 28 minutes    Sharen Hones, MD [facility]   To contact the attending provider between 7A-7P or the covering provider during after hours 7P-7A, please log into the web site [email or web address] and access using universal [place] password for that web site. If you do not have the password, please call the hospital operator.  03/19/2021, 9:45 AM

## 2021-03-19 NOTE — Progress Notes (Signed)
Physical Therapy Treatment Patient Details Name: Madison Mcbride MRN: [id number] DOB: Mar 13, 1956 Today's Date: 03/19/2021    History of Present Illness Pt admitted to [facility] on 03/14/21 for c/o SOB secondary to acute on chronic diastolic CHF exacerbation. Hypoxia at 81% improved with supplemental O2. Elevated troponin likely secondary to volume overload. Significant PMH includes: diastolic CHF, morbid obesity, hypothyroidism, T2DM, OSA, lymphedema, CKD. CXR significant for cardiomegaly with vascular congestion and mild edema.    PT Comments    Pt was pleasant and motivated to participate during the session and made good progress towards goals.  Pt did not require physical assistance to come to standing this session and was able to amb 12 feet with no LOB or buckling.  Pt's SpO2 and HR measured frequently and both were WNL throughout the session. Pt will benefit from PT services in a SNF setting upon discharge to safely address deficits listed in patient problem list for decreased caregiver assistance and eventual return to PLOF.     Follow Up Recommendations  SNF;Supervision/Assistance - 24 hour     Equipment Recommendations  Rolling walker with 5" wheels;3in1 (PT);Wheelchair (measurements PT);Wheelchair cushion (measurements PT)    Recommendations for Other Services       Precautions / Restrictions Precautions Precautions: Fall Restrictions Weight Bearing Restrictions: No Other Position/Activity Restrictions: Strict I/O    Mobility  Bed Mobility Overal bed mobility: Needs Assistance       Supine to sit: Mod assist     General bed mobility comments: Mod A for trunk control    Transfers Overall transfer level: Needs assistance Equipment used: Rolling walker (2 wheeled) Transfers: Sit to/from Stand Sit to Stand: Min guard;From elevated surface         General transfer comment: Min verbal cues for general sequencing  Ambulation/Gait Ambulation/Gait assistance: Min  guard Gait Distance (Feet): 12 Feet Assistive device: Rolling walker (2 wheeled) Gait Pattern/deviations: Step-through pattern;Decreased step length - right;Decreased step length - left;Trunk flexed Gait velocity: decreased   General Gait Details: Slow cadence but steady without LOB or buckling with SpO2 and HR WNL   Stairs             Wheelchair Mobility    Modified Rankin (Stroke Patients Only)       Balance Overall balance assessment: Needs assistance Sitting-balance support: No upper extremity supported;Feet supported Sitting balance-Leahy Scale: Good     Standing balance support: Bilateral upper extremity supported;During functional activity Standing balance-Leahy Scale: Fair                              Cognition Arousal/Alertness: Awake/alert Behavior During Therapy: WFL for tasks assessed/performed Overall Cognitive Status: Within Functional Limits for tasks assessed                                        Exercises Total Joint Exercises Ankle Circles/Pumps: AROM;Strengthening;Both;10 reps;5 reps Quad Sets: Strengthening;Both;10 reps;5 reps Gluteal Sets: Strengthening;Both;5 reps;10 reps Hip ABduction/ADduction: AROM;Strengthening;Both;5 reps Long Arc Quad: AROM;Strengthening;Both;10 reps Knee Flexion: AROM;Strengthening;Both;10 reps Marching in Standing: AROM;Strengthening;Both;5 reps;Standing Other Exercises Other Exercises: HEP education for BLE APs, QS, and GS x 10 each every 1-2 hours daily    General Comments        Pertinent Vitals/Pain Pain Assessment: 0-10 Pain Score: 5  Pain Location: BLEs Pain Intervention(s): Monitored during session;Patient requesting pain meds-RN notified;RN  gave pain meds during session;Repositioned    Home Living                      Prior Function            PT Goals (current goals can now be found in the care plan section) Progress towards PT goals: Progressing toward  goals    Frequency    Min 2X/week      PT Plan Current plan remains appropriate    Co-evaluation              AM-PAC PT "6 Clicks" Mobility   Outcome Measure  Help needed turning from your back to your side while in a flat bed without using bedrails?: A Lot Help needed moving from lying on your back to sitting on the side of a flat bed without using bedrails?: A Lot Help needed moving to and from a bed to a chair (including a wheelchair)?: A Little Help needed standing up from a chair using your arms (e.g., wheelchair or bedside chair)?: A Little Help needed to walk in hospital room?: A Little Help needed climbing 3-5 steps with a railing? : A Lot 6 Click Score: 15    End of Session Equipment Utilized During Treatment: Gait belt;Oxygen Activity Tolerance: Patient tolerated treatment well Patient left: in chair;with call bell/phone within reach;with chair alarm set Nurse Communication: Mobility status PT Visit Diagnosis: Unsteadiness on feet (R26.81);Muscle weakness (generalized) (M62.81);Difficulty in walking, not elsewhere classified (R26.2)     Time: [id number] PT Time Calculation (min) (ACUTE ONLY): 40 min  Charges:  $Gait Training: 8-22 mins $Therapeutic Exercise: 8-22 mins $Therapeutic Activity: 8-22 mins                     D. Scott Naisha Wisdom PT, DPT 03/19/21, 1:37 PM

## 2021-03-19 NOTE — Progress Notes (Signed)
[facility] Encounter Note  Patient: Madison Mcbride / Admit Date: 03/14/2021 / Date of Encounter: 03/19/2021, 12:30 PM   Subjective: Patient has had reasonable urine output and a significant amount of weight loss since admission with diuresis and Lasix drip.  Patient does have acute kidney injury but has now recovered with a glomerular filtration rate of 55.  Patient has had improvements of lower extremity edema but still slightly hypoxic needing oxygen supplement.  No evidence of anginal symptoms or acute myocardial infarction.  Echocardiogram showing low normal to mild LV systolic dysfunction in a global manner most consistent with above issues and ejection fraction of 45 to 50% there is no evidence of significant valvular heart disease  Review of Systems: Positive for: Shortness of breath Negative for: Vision change, hearing change, syncope, dizziness, nausea, vomiting,diarrhea, bloody stool, stomach pain, cough, congestion, diaphoresis, urinary frequency, urinary pain,skin lesions, skin rashes Others previously listed  Objective: Telemetry: Normal sinus rhythm Physical Exam: Blood pressure 133/75, pulse 77, temperature 98.2 F (36.8 C), resp. rate 17, height '5\' 4"'$  (1.626 m), weight (!) 139.8 kg, SpO2 99 %. Body mass index is 52.9 kg/m. General: Well developed, well nourished, in no acute distress. Head: Normocephalic, atraumatic, sclera non-icteric, no xanthomas, nares are without discharge. Neck: No apparent masses Lungs: Normal respirations with no wheezes, no rhonchi, no rales , few crackles   Heart: Regular rate and rhythm, normal S1 S2, no murmur, no rub, no gallop, PMI is normal size and placement, carotid upstroke normal without bruit, jugular venous pressure normal Abdomen: Soft, non-tender, distended with normoactive bowel sounds. No hepatosplenomegaly. Abdominal aorta is normal size without bruit Extremities: Trace edema, no clubbing, no cyanosis, no  ulcers,  Peripheral: 2+ radial, 2+ femoral, 2+ dorsal pedal pulses Neuro: Alert and oriented. Moves all extremities spontaneously. Psych:  Responds to questions appropriately with a normal affect.   Intake/Output Summary (Last 24 hours) at 03/19/2021 1230 Last data filed at 03/19/2021 0807 Gross per 24 hour  Intake 395.37 ml  Output 2520 ml  Net -2124.63 ml    Inpatient Medications:  . acetaZOLAMIDE ER  500 mg Oral Q12H  . aspirin EC  81 mg Oral Daily  . atorvastatin  20 mg Oral Q2200  . buPROPion  150 mg Oral Daily  . carvedilol  3.125 mg Oral BID WC  . gabapentin  300 mg Oral BID  . guaiFENesin  600 mg Oral BID  . hydrocerin   Topical BID  . insulin aspart  0-9 Units Subcutaneous TID WC  . levothyroxine  250 mcg Oral Q0600  . rOPINIRole  3 mg Oral TID  . sodium chloride flush  3 mL Intravenous Q12H  . torsemide  40 mg Oral Daily   Infusions:   Labs: Recent Labs    03/18/21 0729 03/19/21 0245  NA 141 141  K 3.9 3.5  CL 91* 92*  CO2 42* 39*  GLUCOSE 94 107*  BUN 24* 23  CREATININE 1.06* 1.10*  CALCIUM 8.5* 8.7*  MG 1.9 2.0   No results for input(s): AST, ALT, ALKPHOS, BILITOT, PROT, ALBUMIN in the last 72 hours. No results for input(s): WBC, NEUTROABS, HGB, HCT, MCV, PLT in the last 72 hours. No results for input(s): CKTOTAL, CKMB, TROPONINI in the last 72 hours. Invalid input(s): POCBNP No results for input(s): HGBA1C in the last 72 hours.   Weights: Filed Weights   03/17/21 0405 03/18/21 0618 03/19/21 0405  Weight: (!) 152.2 kg (!) 143.1 kg (!) 139.8  kg     Radiology/Studies:  DG Chest Portable 1 View  Result Date: 03/14/2021 CLINICAL DATA:  Hypoxia EXAM: PORTABLE CHEST 1 VIEW COMPARISON:  04/17/2020 FINDINGS: Cardiomegaly with vascular congestion and probable mild edema. No pleural effusion or pneumothorax. IMPRESSION: Cardiomegaly with vascular congestion and mild edema Electronically Signed   By: Donavan Foil M.D.   On: 03/14/2021 19:30    ECHOCARDIOGRAM COMPLETE  Result Date: 03/17/2021    ECHOCARDIOGRAM REPORT   Patient Name:   Madison Mcbride Date of Exam: 03/16/2021 Medical Rec #:  [id number]             Height:       64.0 in Accession #:    [id number]            Weight:       331.1 lb Date of Birth:  Oct 07, 1955             BSA:          2.424 m Patient Age:    65 years              BP:           138/77 mmHg Patient Gender: F                     HR:           58 bpm. Exam Location:  [facility] Procedure: 2D Echo and Intracardiac Opacification Agent Indications:     CHF I50.31  History:         Patient has prior history of Echocardiogram examinations, most                  recent 02/21/2020.  Sonographer:     Kathlen Brunswick RDCS Referring Phys:  [id number] [place] M ECKSTAT Diagnosing Phys: Serafina Royals MD  Sonographer Comments: Technically difficult study due to poor echo windows and patient is morbidly obese. Image acquisition challenging due to patient body habitus. IMPRESSIONS  1. Left ventricular ejection fraction, by estimation, is 45 to 50%. The left ventricle has mildly decreased function. The left ventricle has no regional wall motion abnormalities. Left ventricular diastolic parameters were normal.  2. Right ventricular systolic function is normal. The right ventricular size is normal.  3. The mitral valve is normal in structure. Trivial mitral valve regurgitation.  4. The aortic valve is normal in structure. Aortic valve regurgitation is not visualized. FINDINGS  Left Ventricle: Left ventricular ejection fraction, by estimation, is 45 to 50%. The left ventricle has mildly decreased function. The left ventricle has no regional wall motion abnormalities. Definity contrast agent was given IV to delineate the left ventricular endocardial borders. The left ventricular internal cavity size was normal in size. There is no left ventricular hypertrophy. Left ventricular diastolic parameters were normal. Right Ventricle: The right ventricular  size is normal. No increase in right ventricular wall thickness. Right ventricular systolic function is normal. Left Atrium: Left atrial size was normal in size. Right Atrium: Right atrial size was normal in size. Pericardium: There is no evidence of pericardial effusion. Mitral Valve: The mitral valve is normal in structure. Trivial mitral valve regurgitation. Tricuspid Valve: The tricuspid valve is normal in structure. Tricuspid valve regurgitation is trivial. Aortic Valve: The aortic valve is normal in structure. Aortic valve regurgitation is not visualized. Aortic regurgitation PHT measures 500 msec. Aortic valve peak gradient measures 7.0 mmHg. Pulmonic Valve: The pulmonic valve was normal in structure. Pulmonic valve regurgitation is not  visualized. Aorta: The aortic root and ascending aorta are structurally normal, with no evidence of dilitation. IAS/Shunts: No atrial level shunt detected by color flow Doppler.  LEFT VENTRICLE PLAX 2D LVIDd:         6.10 cm  Diastology LVIDs:         4.70 cm  LV e' medial:    3.81 cm/s LV PW:         1.10 cm  LV E/e' medial:  24.6 LV IVS:        1.10 cm  LV e' lateral:   8.16 cm/s LVOT diam:     2.10 cm  LV E/e' lateral: 11.5 LV SV:         69 LV SV Index:   29 LVOT Area:     3.46 cm  RIGHT VENTRICLE RV Basal diam:  4.70 cm RV S prime:     10.80 cm/s TAPSE (M-mode): 2.2 cm LEFT ATRIUM           Index       RIGHT ATRIUM           Index LA diam:      3.00 cm 1.24 cm/m  RA Area:     17.20 cm LA Vol (A2C): 35.0 ml 14.44 ml/m RA Volume:   46.70 ml  19.27 ml/m LA Vol (A4C): 53.7 ml 22.15 ml/m  AORTIC VALVE                PULMONIC VALVE AV Area (Vmax): 2.38 cm    PV Vmax:       1.38 m/s AV Vmax:        132.00 cm/s PV Peak grad:  7.6 mmHg AV Peak Grad:   7.0 mmHg LVOT Vmax:      90.70 cm/s LVOT Vmean:     60.200 cm/s LVOT VTI:       0.200 m AI PHT:         500 msec  AORTA Ao Root diam: 5.00 cm Ao Asc diam:  3.70 cm MITRAL VALVE MV Area (PHT): 2.67 cm    SHUNTS MV Decel Time:  284 msec    Systemic VTI:  0.20 m MV E velocity: 93.80 cm/s  Systemic Diam: 2.10 cm MV A velocity: 86.50 cm/s MV E/A ratio:  1.08 Serafina Royals MD Electronically signed by Serafina Royals MD Signature Date/Time: 03/17/2021/6:29:25 AM    Final      Assessment and Recommendation  65 y.o. female with acute 9 systolic diastolic dysfunction congestive heart failure with pulmonary edema chronic kidney disease with acute kidney injury without evidence of myocardial infarction 1.  Continue carvedilol with plan for increasing dose today for further risk reduction of congestive heart failure and treatment of hypertension 2.  Reinstatement of diuretics as outpatient including torsemide 40 mg each day 3.  Other cardiac diagnostics necessary at this time 4.  Begin ambulation and follow-up for improvements of symptoms and okay for discharge to facility with a follow-up in 1 month  Signed, Serafina Royals M.D. FACC

## 2021-03-20 LAB — BASIC METABOLIC PANEL
Anion gap: 10 (ref 5–15)
BUN: 26 mg/dL — ABNORMAL HIGH (ref 8–23)
CO2: 39 mmol/L — ABNORMAL HIGH (ref 22–32)
Calcium: 8.8 mg/dL — ABNORMAL LOW (ref 8.9–10.3)
Chloride: 93 mmol/L — ABNORMAL LOW (ref 98–111)
Creatinine, Ser: 1.11 mg/dL — ABNORMAL HIGH (ref 0.44–1.00)
GFR, Estimated: 55 mL/min — ABNORMAL LOW (ref >60)
Glucose, Bld: 97 mg/dL (ref 70–99)
Potassium: 3.9 mmol/L (ref 3.5–5.1)
Sodium: 142 mmol/L (ref 135–145)

## 2021-03-20 LAB — GLUCOSE, CAPILLARY
Glucose-Capillary: 108 mg/dL — ABNORMAL HIGH (ref 70–99)
Glucose-Capillary: 276 mg/dL — ABNORMAL HIGH (ref 70–99)
Glucose-Capillary: 101 mg/dL — ABNORMAL HIGH (ref 70–99)
Glucose-Capillary: 100 mg/dL — ABNORMAL HIGH (ref 70–99)
Glucose-Capillary: 114 mg/dL — ABNORMAL HIGH (ref 70–99)

## 2021-03-20 LAB — MAGNESIUM: Magnesium: 2.2 mg/dL (ref 1.7–2.4)

## 2021-03-20 MED ORDER — GABAPENTIN 600 MG PO TABS
300.0000 mg | ORAL_TABLET | Freq: Three times a day (TID) | ORAL | Status: DC
  Administered 2021-03-20 – 2021-03-24 (×9): 300 mg via ORAL
  Filled 2021-03-20 (×9): qty 1

## 2021-03-20 NOTE — Progress Notes (Signed)
PROGRESS NOTE    Madison Mcbride  [id number] DOB: [birth year] DOA: 03/14/2021 PCP: Rusty Aus, MD   Chief complaint.  Shortness of breath. Brief Narrative:  Madison Mcbride is a 65 y.o. female with history of diastolic CHF, morbid obesity, hypothyroidism, type 2 diabetes, OSA, lymphedema, CKD, who presents with shortness of breath.[id number] with vascular congestion and mild edema.  She was a found to have acute on chronic diastolic congestive heart failure, her BNP was 1805, she was also required 3 L oxygen for hypoxemia.  She was placed on IV Lasix drip. Condition had improved, transitioned diuretics to oral on 9/7.  Echocardiogram showed ejection fraction 45 to 50%. she is medically stable to be discharged, pending nursing home placement.     Assessment & Plan:   Active Problems:   Type 2 diabetes mellitus with diabetic neuropathy, without long-term current use of insulin (HCC)   Hypothyroidism   Morbid obesity with BMI of 50.0-59.9, adult (HCC)   OSA (obstructive sleep apnea)   Acute hypoxemic respiratory failure (HCC)   Acute on chronic systolic CHF (congestive heart failure) (HCC)  Acute on chronic systolic congestive heart failure  Acute hypoxemic respiratory failure. Elevated troponin secondary to congestive heart failure. Coronary artery disease. Condition stable, continue oral diuretics with torsemide and acetazolamide. Continue beta-blocker. Recheck a BMP tomorrow.  Nonsustained ventricular tachycardia. I was called by nurse, patient had 4 beats of V. tach.  This is a secondary to systolic dysfunction.  Continue beta-blocker.  Acute kidney injury. Renal function still stable.  Type 2 diabetes. Continue current regimen.   DVT prophylaxis: Lovenox Code Status: full Family Communication:  Disposition Plan:    Status is: Inpatient  Remains inpatient appropriate because:Unsafe d/c plan  Dispo: The patient is from: Home               Anticipated d/c is to: SNF              Patient currently is medically stable to d/c.   Difficult to place patient No        I/O last 3 completed shifts: In: 340.1 [P.O.:300; I.V.:40.1] Out: 4220 [Urine:4220] Total I/O In: 360 [P.O.:360] Out: 900 [Urine:900]     Consultants:  cardiology  Procedures: None  Antimicrobials: None  Subjective: Patient condition has improved, short of breath is better. Cough, with clear mucus. No fever or chills. No dysuria hematuria  No chest pain or palpitation.  Objective: Vitals:   03/20/21 0357 03/20/21 0720 03/20/21 0902 03/20/21 1139  BP: 114/66 104/78  (!) 108/58  Pulse: 69 70  72  Resp: '18 18  17  '$ Temp: 98 F (36.7 C) 98.3 F (36.8 C)  97.7 F (36.5 C)  TempSrc:      SpO2: 95% 96%  100%  Weight:   (!) 139.2 kg   Height:        Intake/Output Summary (Last 24 hours) at 03/20/2021 1449 Last data filed at 03/20/2021 1121 Gross per 24 hour  Intake 664.84 ml  Output 1950 ml  Net -1285.16 ml   Filed Weights   03/18/21 0618 03/19/21 0405 03/20/21 0902  Weight: (!) 143.1 kg (!) 139.8 kg (!) 139.2 kg    Examination:  General exam: Appears calm and comfortable  Respiratory system: Small minor crackles in the right base. Respiratory effort normal. Cardiovascular system: S1 & S2 heard, RRR. No JVD, murmurs, rubs, gallops or clicks. No pedal edema. Gastrointestinal system: Abdomen is nondistended, soft and nontender. No organomegaly or masses  felt. Normal bowel sounds heard. Central nervous system: Alert and oriented. No focal neurological deficits. Extremities: Symmetric 5 x 5 power. Skin: No rashes, lesions or ulcers Psychiatry: Judgement and insight appear normal. Mood & affect appropriate.     Data Reviewed: I have personally reviewed following labs and imaging studies  CBC: Recent Labs  Lab 03/14/21 1817 03/15/21 0527  WBC 11.2* 10.6*  NEUTROABS 8.4*  --   HGB 13.2 12.4  HCT 45.8 43.8  MCV 83.4 83.9  PLT 429*  [id number]   Basic Metabolic Panel: Recent Labs  Lab 03/16/21 0410 03/17/21 0442 03/18/21 0729 03/19/21 0245 03/20/21 0415  NA 142 140 141 141 142  K 4.7 4.2 3.9 3.5 3.9  CL 101 95* 91* 92* 93*  CO2 34* 39* 42* 39* 39*  GLUCOSE 90 84 94 107* 97  BUN 35* 29* 24* 23 26*  CREATININE 1.46* 1.16* 1.06* 1.10* 1.11*  CALCIUM 8.3* 8.3* 8.5* 8.7* 8.8*  MG  --   --  1.9 2.0 2.2   GFR: Estimated Creatinine Clearance: 69.7 mL/min (A) (by C-G formula based on SCr of 1.11 mg/dL (H)). Liver Function Tests: Recent Labs  Lab 03/14/21 1817  AST 25  ALT 13  ALKPHOS 103  BILITOT 2.0*  PROT 7.4  ALBUMIN 3.1*   No results for input(s): LIPASE, AMYLASE in the last 168 hours. No results for input(s): AMMONIA in the last 168 hours. Coagulation Profile: No results for input(s): INR, PROTIME in the last 168 hours. Cardiac Enzymes: No results for input(s): CKTOTAL, CKMB, CKMBINDEX, TROPONINI in the last 168 hours. BNP (last 3 results) No results for input(s): PROBNP in the last 8760 hours. HbA1C: No results for input(s): HGBA1C in the last 72 hours. CBG: Recent Labs  Lab 03/19/21 1131 03/19/21 1641 03/19/21 2052 03/20/21 0719 03/20/21 1123  GLUCAP 153* 121* 103* 108* 276*   Lipid Profile: No results for input(s): CHOL, HDL, LDLCALC, TRIG, CHOLHDL, LDLDIRECT in the last 72 hours. Thyroid Function Tests: No results for input(s): TSH, T4TOTAL, FREET4, T3FREE, THYROIDAB in the last 72 hours. Anemia Panel: No results for input(s): VITAMINB12, FOLATE, FERRITIN, TIBC, IRON, RETICCTPCT in the last 72 hours. Sepsis Labs: Recent Labs  Lab 03/14/21 1811  LATICACIDVEN 2.5*    Recent Results (from the past 240 hour(s))  Resp Panel by RT-PCR (Flu A&B, Covid)     Status: None   Collection Time: 03/14/21  8:21 PM   Specimen: Nasopharyngeal(NP) swabs in vial transport medium  Result Value Ref Range Status   SARS Coronavirus 2 by RT PCR NEGATIVE NEGATIVE Final    Comment: (NOTE) SARS-CoV-2 target  nucleic acids are NOT DETECTED.  The SARS-CoV-2 RNA is generally detectable in upper respiratory specimens during the acute phase of infection. The lowest concentration of SARS-CoV-2 viral copies this assay can detect is 138 copies/mL. A negative result does not preclude SARS-Cov-2 infection and should not be used as the sole basis for treatment or other patient management decisions. A negative result may occur with  improper specimen collection/handling, submission of specimen other than nasopharyngeal swab, presence of viral mutation(s) within the areas targeted by this assay, and inadequate number of viral copies(<138 copies/mL). A negative result must be combined with clinical observations, patient history, and epidemiological information. The expected result is Negative.  Fact Sheet for Patients:  EntrepreneurPulse.com.au  Fact Sheet for Healthcare Providers:  IncredibleEmployment.be  This test is no t yet approved or cleared by the [place] FDA and  has been authorized for detection  and/or diagnosis of SARS-CoV-2 by FDA under an Emergency Use Authorization (EUA). This EUA will remain  in effect (meaning this test can be used) for the duration of the COVID-19 declaration under Section 564(b)(1) of the Act, 21 U.S.C.section 360bbb-3(b)(1), unless the authorization is terminated  or revoked sooner.       Influenza A by PCR NEGATIVE NEGATIVE Final   Influenza B by PCR NEGATIVE NEGATIVE Final    Comment: (NOTE) The Xpert Xpress SARS-CoV-2/FLU/RSV plus assay is intended as an aid in the diagnosis of influenza from Nasopharyngeal swab specimens and should not be used as a sole basis for treatment. Nasal washings and aspirates are unacceptable for Xpert Xpress SARS-CoV-2/FLU/RSV testing.  Fact Sheet for Patients: EntrepreneurPulse.com.au  Fact Sheet for Healthcare  Providers: IncredibleEmployment.be  This test is not yet approved or cleared by the [place] FDA and has been authorized for detection and/or diagnosis of SARS-CoV-2 by FDA under an Emergency Use Authorization (EUA). This EUA will remain in effect (meaning this test can be used) for the duration of the COVID-19 declaration under Section 564(b)(1) of the Act, 21 U.S.C. section 360bbb-3(b)(1), unless the authorization is terminated or revoked.  Performed at [facility], [address] [place], [place] [address]          Radiology Studies: No results found.      Scheduled Meds:  acetaZOLAMIDE ER  500 mg Oral Q12H   aspirin EC  81 mg Oral Daily   atorvastatin  20 mg Oral Q2200   buPROPion  150 mg Oral Daily   carvedilol  6.25 mg Oral BID WC   gabapentin  300 mg Oral BID   guaiFENesin  600 mg Oral BID   hydrocerin   Topical BID   insulin aspart  0-9 Units Subcutaneous TID WC   levothyroxine  250 mcg Oral Q0600   rOPINIRole  3 mg Oral TID   sodium chloride flush  3 mL Intravenous Q12H   torsemide  40 mg Oral Daily   Continuous Infusions:   LOS: 6 days    Time spent: 27 minutes    Sharen Hones, MD [facility]   To contact the attending provider between 7A-7P or the covering provider during after hours 7P-7A, please log into the web site [email or web address] and access using universal [place] password for that web site. If you do not have the password, please call the hospital operator.  03/20/2021, 2:49 PM

## 2021-03-20 NOTE — TOC Progression Note (Signed)
Transition of Care [facility]) - Progression Note    Patient Details  Name: Madison Mcbride MRN: [id number] Date of Birth: 1955-10-28  [facility] CM/SW Contact  Eileen Stanford, LCSW Phone Number: 03/20/2021, 3:56 PM  Clinical Narrative:  Pt has been accepted at [facility]. Auth started by facility.     Expected Discharge Plan:  (to be determined) Barriers to Discharge: Continued Medical Work up  Expected Discharge Plan and Services Expected Discharge Plan:  (to be determined)       Living arrangements for the past 2 months: Single Family Home                                       Social Determinants of Health (SDOH) Interventions    Readmission Risk Interventions No flowsheet data found.

## 2021-03-20 NOTE — Progress Notes (Signed)
[facility]  ROUNDING NOTE   Subjective:   Madison Mcbride is a 65 year old female with a past medical history of diastolic chronic heart failure, morbid obesity, diabetes, hypothyroidism, sleep apnea, and chronic kidney disease stage IIIa.  Patient was admitted to [facility] for Acute on chronic heart failure ([place]) [I50.9] Acute on chronic congestive heart failure, unspecified heart failure type ([place]) [I50.9]  Patient seen sitting up in bed Alert and oriented Tolerating meals Denies nausea and vomiting Denies shortness of breath   Objective:  Vital signs in last 24 hours:  Temp:  [97.7 F (36.5 C)-98.5 F (36.9 C)] 97.7 F (36.5 C) (09/08 1139) Pulse Rate:  [69-88] 72 (09/08 1139) Resp:  [17-20] 17 (09/08 1139) BP: (104-146)/(58-85) 108/58 (09/08 1139) SpO2:  [93 %-100 %] 100 % (09/08 1139) Weight:  [139.2 kg] 139.2 kg (09/08 0902)  Weight change:  Filed Weights   03/18/21 0618 03/19/21 0405 03/20/21 0902  Weight: (!) 143.1 kg (!) 139.8 kg (!) 139.2 kg    Intake/Output: I/O last 3 completed shifts: In: 340.1 [P.O.:300; I.V.:40.1] Out: 4220 [Urine:4220]   Intake/Output this shift:  Total I/O In: 360 [P.O.:360] Out: 900 [Urine:900]  Physical Exam: General: NAD, sitting up in bed  Head: Normocephalic, atraumatic. Moist oral mucosal membranes  Eyes: Anicteric  Lungs:  Clear to auscultation, normal effort, [place] O2  Heart: Regular rate and rhythm  Abdomen:  Soft, nontender  Extremities:  noperipheral edema  Neurologic: Nonfocal, moving all four extremities  Skin: No lesions, erythema bilat lower legs       Basic Metabolic Panel: Recent Labs  Lab 03/16/21 0410 03/17/21 0442 03/18/21 0729 03/19/21 0245 03/20/21 0415  NA 142 140 141 141 142  K 4.7 4.2 3.9 3.5 3.9  CL 101 95* 91* 92* 93*  CO2 34* 39* 42* 39* 39*  GLUCOSE 90 84 94 107* 97  BUN 35* 29* 24* 23 26*  CREATININE 1.46* 1.16* 1.06* 1.10* 1.11*  CALCIUM 8.3* 8.3* 8.5* 8.7* 8.8*  MG   --   --  1.9 2.0 2.2     Liver Function Tests: Recent Labs  Lab 03/14/21 1817  AST 25  ALT 13  ALKPHOS 103  BILITOT 2.0*  PROT 7.4  ALBUMIN 3.1*    No results for input(s): LIPASE, AMYLASE in the last 168 hours. No results for input(s): AMMONIA in the last 168 hours.  CBC: Recent Labs  Lab 03/14/21 1817 03/15/21 0527  WBC 11.2* 10.6*  NEUTROABS 8.4*  --   HGB 13.2 12.4  HCT 45.8 43.8  MCV 83.4 83.9  PLT 429* 397     Cardiac Enzymes: No results for input(s): CKTOTAL, CKMB, CKMBINDEX, TROPONINI in the last 168 hours.  BNP: Invalid input(s): POCBNP  CBG: Recent Labs  Lab 03/19/21 1131 03/19/21 1641 03/19/21 2052 03/20/21 0719 03/20/21 1123  GLUCAP 153* 121* 103* 108* 276*     Microbiology: Results for orders placed or performed during the hospital encounter of 03/14/21  Resp Panel by RT-PCR (Flu A&B, Covid)     Status: None   Collection Time: 03/14/21  8:21 PM   Specimen: Nasopharyngeal(NP) swabs in vial transport medium  Result Value Ref Range Status   SARS Coronavirus 2 by RT PCR NEGATIVE NEGATIVE Final    Comment: (NOTE) SARS-CoV-2 target nucleic acids are NOT DETECTED.  The SARS-CoV-2 RNA is generally detectable in upper respiratory specimens during the acute phase of infection. The lowest concentration of SARS-CoV-2 viral copies this assay can detect is 138 copies/mL.  A negative result does not preclude SARS-Cov-2 infection and should not be used as the sole basis for treatment or other patient management decisions. A negative result may occur with  improper specimen collection/handling, submission of specimen other than nasopharyngeal swab, presence of viral mutation(s) within the areas targeted by this assay, and inadequate number of viral copies(<138 copies/mL). A negative result must be combined with clinical observations, patient history, and epidemiological information. The expected result is Negative.  Fact Sheet for Patients:   EntrepreneurPulse.com.au  Fact Sheet for Healthcare Providers:  IncredibleEmployment.be  This test is no t yet approved or cleared by the [place] FDA and  has been authorized for detection and/or diagnosis of SARS-CoV-2 by FDA under an Emergency Use Authorization (EUA). This EUA will remain  in effect (meaning this test can be used) for the duration of the COVID-19 declaration under Section 564(b)(1) of the Act, 21 U.S.C.section 360bbb-3(b)(1), unless the authorization is terminated  or revoked sooner.       Influenza A by PCR NEGATIVE NEGATIVE Final   Influenza B by PCR NEGATIVE NEGATIVE Final    Comment: (NOTE) The Xpert Xpress SARS-CoV-2/FLU/RSV plus assay is intended as an aid in the diagnosis of influenza from Nasopharyngeal swab specimens and should not be used as a sole basis for treatment. Nasal washings and aspirates are unacceptable for Xpert Xpress SARS-CoV-2/FLU/RSV testing.  Fact Sheet for Patients: EntrepreneurPulse.com.au  Fact Sheet for Healthcare Providers: IncredibleEmployment.be  This test is not yet approved or cleared by the [place] FDA and has been authorized for detection and/or diagnosis of SARS-CoV-2 by FDA under an Emergency Use Authorization (EUA). This EUA will remain in effect (meaning this test can be used) for the duration of the COVID-19 declaration under Section 564(b)(1) of the Act, 21 U.S.C. section 360bbb-3(b)(1), unless the authorization is terminated or revoked.  Performed at [facility], [address] [place], [place] [address]     Coagulation Studies: No results for input(s): LABPROT, INR in the last 72 hours.  Urinalysis: No results for input(s): COLORURINE, LABSPEC, PHURINE, GLUCOSEU, HGBUR, BILIRUBINUR, KETONESUR, PROTEINUR, UROBILINOGEN, NITRITE, LEUKOCYTESUR in the last 72 hours.  Invalid input(s): APPERANCEUR    Imaging: No  results found.   Medications:      acetaZOLAMIDE ER  500 mg Oral Q12H   aspirin EC  81 mg Oral Daily   atorvastatin  20 mg Oral Q2200   buPROPion  150 mg Oral Daily   carvedilol  6.25 mg Oral BID WC   gabapentin  300 mg Oral BID   guaiFENesin  600 mg Oral BID   hydrocerin   Topical BID   insulin aspart  0-9 Units Subcutaneous TID WC   levothyroxine  250 mcg Oral Q0600   rOPINIRole  3 mg Oral TID   sodium chloride flush  3 mL Intravenous Q12H   torsemide  40 mg Oral Daily   acetaminophen **OR** acetaminophen, aspirin-acetaminophen-caffeine, guaiFENesin-dextromethorphan, ondansetron **OR** ondansetron (ZOFRAN) IV, oxyCODONE, polyethylene glycol  Assessment/ Plan:  Madison Mcbride is a 65 y.o.  female with a past medical history of diastolic chronic heart failure, morbid obesity, diabetes, hypothyroidism, sleep apnea, and chronic kidney disease stage IIIa.  Patient was admitted to [facility] for Acute on chronic heart failure ([place]) [I50.9] Acute on chronic congestive heart failure, unspecified heart failure type ([place]) [I50.9]   Acute Kidney Injury on chronic kidney disease stage 3b/diabetes mellitus type 2 with chronic kidney disease with baseline GFR of 47 on 03/14/21. Renal US  shows slightly enlarged kidneys with rt kidney cyst. Oral diuretics started yesterday, Torsemide '40mg'$  daily. Recorded urine output 2L in past 24 hours. Will need outpatient follow up with our office discharge Cleared to discharge from renal stance.   Lab Results  Component Value Date   CREATININE 1.11 (H) 03/20/2021   CREATININE 1.10 (H) 03/19/2021   CREATININE 1.06 (H) 03/18/2021    Intake/Output Summary (Last 24 hours) at 03/20/2021 1149 Last data filed at 03/20/2021 1121 Gross per 24 hour  Intake 664.84 ml  Output 1950 ml  Net -1285.16 ml    2. Acute on chronic systolic heart failure. Ejection fraction 45-50%. Torsemide '40mg'$  daily     LOS: 6   9/8/202211:49 AM

## 2021-03-20 NOTE — Progress Notes (Signed)
Madison [facility] Encounter Note  Patient: Madison Mcbride / Admit Date: 03/14/2021 / Date of Encounter: 03/20/2021, 8:41 AM   Subjective: 9/7.  Patient has had reasonable urine output and a significant amount of weight loss since admission with diuresis and Lasix drip.  Patient does have acute kidney injury but has now recovered with a glomerular filtration rate of 55.  Patient has had improvements of lower extremity edema but still slightly hypoxic needing oxygen supplement.  No evidence of anginal symptoms or acute myocardial infarction.  9/8.  Patient has been switched to oral torsemide for further continued diuretic treatment with impressive urine output and continued improvements diastolic dysfunction congestive heart failure.  Overall patient has had minimal ambulation but has had no concerns at this time for angina.  Patient has been introduced with higher dosage of beta-blocker which has helped and currently appears to have blood pressure and heart rate control and ideal manner  Echocardiogram showing low normal to mild LV systolic dysfunction in a global manner most consistent with above issues and ejection fraction of 45 to 50% there is no evidence of significant valvular heart disease  Review of Systems: Positive for: Shortness of breath Negative for: Vision change, hearing change, syncope, dizziness, nausea, vomiting,diarrhea, bloody stool, stomach pain, cough, congestion, diaphoresis, urinary frequency, urinary pain,skin lesions, skin rashes Others previously listed  Objective: Telemetry: Normal sinus rhythm Physical Exam: Blood pressure 104/78, pulse 70, temperature 98.3 F (36.8 C), resp. rate 18, height '5\' 4"'$  (1.626 m), weight (!) 139.8 kg, SpO2 96 %. Body mass index is 52.9 kg/m. General: Well developed, well nourished, in no acute distress. Head: Normocephalic, atraumatic, sclera non-icteric, no xanthomas, nares are without discharge. Neck: No apparent  masses Lungs: Normal respirations with no wheezes, no rhonchi, no rales , few crackles   Heart: Regular rate and rhythm, normal S1 S2, no murmur, no rub, no gallop, PMI is normal size and placement, carotid upstroke normal without bruit, jugular venous pressure normal Abdomen: Soft, non-tender, distended with normoactive bowel sounds. No hepatosplenomegaly. Abdominal aorta is normal size without bruit Extremities: Trace edema, no clubbing, no cyanosis, no ulcers,  Peripheral: 2+ radial, 2+ femoral, 2+ dorsal pedal pulses Neuro: Alert and oriented. Moves all extremities spontaneously. Psych:  Responds to questions appropriately with a normal affect.   Intake/Output Summary (Last 24 hours) at 03/20/2021 0841 Last data filed at 03/20/2021 0700 Gross per 24 hour  Intake 304.84 ml  Output 1700 ml  Net -1395.16 ml     Inpatient Medications:  . acetaZOLAMIDE ER  500 mg Oral Q12H  . aspirin EC  81 mg Oral Daily  . atorvastatin  20 mg Oral Q2200  . buPROPion  150 mg Oral Daily  . carvedilol  6.25 mg Oral BID WC  . gabapentin  300 mg Oral BID  . guaiFENesin  600 mg Oral BID  . hydrocerin   Topical BID  . insulin aspart  0-9 Units Subcutaneous TID WC  . levothyroxine  250 mcg Oral Q0600  . rOPINIRole  3 mg Oral TID  . sodium chloride flush  3 mL Intravenous Q12H  . torsemide  40 mg Oral Daily   Infusions:   Labs: Recent Labs    03/19/21 0245 03/20/21 0415  NA 141 142  K 3.5 3.9  CL 92* 93*  CO2 39* 39*  GLUCOSE 107* 97  BUN 23 26*  CREATININE 1.10* 1.11*  CALCIUM 8.7* 8.8*  MG 2.0 2.2    No results for  input(s): AST, ALT, ALKPHOS, BILITOT, PROT, ALBUMIN in the last 72 hours. No results for input(s): WBC, NEUTROABS, HGB, HCT, MCV, PLT in the last 72 hours. No results for input(s): CKTOTAL, CKMB, TROPONINI in the last 72 hours. Invalid input(s): POCBNP No results for input(s): HGBA1C in the last 72 hours.   Weights: Filed Weights   03/17/21 0405 03/18/21 0618 03/19/21 0405   Weight: (!) 152.2 kg (!) 143.1 kg (!) 139.8 kg     Radiology/Studies:  DG Chest Portable 1 View  Result Date: 03/14/2021 CLINICAL DATA:  Hypoxia EXAM: PORTABLE CHEST 1 VIEW COMPARISON:  04/17/2020 FINDINGS: Cardiomegaly with vascular congestion and probable mild edema. No pleural effusion or pneumothorax. IMPRESSION: Cardiomegaly with vascular congestion and mild edema Electronically Signed   By: Donavan Foil M.D.   On: 03/14/2021 19:30   ECHOCARDIOGRAM COMPLETE  Result Date: 03/17/2021    ECHOCARDIOGRAM REPORT   Patient Name:   Madison Mcbride Date of Exam: 03/16/2021 Medical Rec #:  [id number]             Height:       64.0 in Accession #:    [id number]            Weight:       331.1 lb Date of Birth:  10/23/1955             BSA:          2.424 m Patient Age:    65 years              BP:           138/77 mmHg Patient Gender: F                     HR:           58 bpm. Exam Location:  [facility] Procedure: 2D Echo and Intracardiac Opacification Agent Indications:     CHF I50.31  History:         Patient has prior history of Echocardiogram examinations, most                  recent 02/21/2020.  Sonographer:     Kathlen Brunswick RDCS Referring Phys:  [id number] [place] M ECKSTAT Diagnosing Phys: Serafina Royals MD  Sonographer Comments: Technically difficult study due to poor echo windows and patient is morbidly obese. Image acquisition challenging due to patient body habitus. IMPRESSIONS  1. Left ventricular ejection fraction, by estimation, is 45 to 50%. The left ventricle has mildly decreased function. The left ventricle has no regional wall motion abnormalities. Left ventricular diastolic parameters were normal.  2. Right ventricular systolic function is normal. The right ventricular size is normal.  3. The mitral valve is normal in structure. Trivial mitral valve regurgitation.  4. The aortic valve is normal in structure. Aortic valve regurgitation is not visualized. FINDINGS  Left Ventricle: Left  ventricular ejection fraction, by estimation, is 45 to 50%. The left ventricle has mildly decreased function. The left ventricle has no regional wall motion abnormalities. Definity contrast agent was given IV to delineate the left ventricular endocardial borders. The left ventricular internal cavity size was normal in size. There is no left ventricular hypertrophy. Left ventricular diastolic parameters were normal. Right Ventricle: The right ventricular size is normal. No increase in right ventricular wall thickness. Right ventricular systolic function is normal. Left Atrium: Left atrial size was normal in size. Right Atrium: Right atrial size was normal in size. Pericardium: There  is no evidence of pericardial effusion. Mitral Valve: The mitral valve is normal in structure. Trivial mitral valve regurgitation. Tricuspid Valve: The tricuspid valve is normal in structure. Tricuspid valve regurgitation is trivial. Aortic Valve: The aortic valve is normal in structure. Aortic valve regurgitation is not visualized. Aortic regurgitation PHT measures 500 msec. Aortic valve peak gradient measures 7.0 mmHg. Pulmonic Valve: The pulmonic valve was normal in structure. Pulmonic valve regurgitation is not visualized. Aorta: The aortic root and ascending aorta are structurally normal, with no evidence of dilitation. IAS/Shunts: No atrial level shunt detected by color flow Doppler.  LEFT VENTRICLE PLAX 2D LVIDd:         6.10 cm  Diastology LVIDs:         4.70 cm  LV e' medial:    3.81 cm/s LV PW:         1.10 cm  LV E/e' medial:  24.6 LV IVS:        1.10 cm  LV e' lateral:   8.16 cm/s LVOT diam:     2.10 cm  LV E/e' lateral: 11.5 LV SV:         69 LV SV Index:   29 LVOT Area:     3.46 cm  RIGHT VENTRICLE RV Basal diam:  4.70 cm RV S prime:     10.80 cm/s TAPSE (M-mode): 2.2 cm LEFT ATRIUM           Index       RIGHT ATRIUM           Index LA diam:      3.00 cm 1.24 cm/m  RA Area:     17.20 cm LA Vol (A2C): 35.0 ml 14.44 ml/m  RA Volume:   46.70 ml  19.27 ml/m LA Vol (A4C): 53.7 ml 22.15 ml/m  AORTIC VALVE                PULMONIC VALVE AV Area (Vmax): 2.38 cm    PV Vmax:       1.38 m/s AV Vmax:        132.00 cm/s PV Peak grad:  7.6 mmHg AV Peak Grad:   7.0 mmHg LVOT Vmax:      90.70 cm/s LVOT Vmean:     60.200 cm/s LVOT VTI:       0.200 m AI PHT:         500 msec  AORTA Ao Root diam: 5.00 cm Ao Asc diam:  3.70 cm MITRAL VALVE MV Area (PHT): 2.67 cm    SHUNTS MV Decel Time: 284 msec    Systemic VTI:  0.20 m MV E velocity: 93.80 cm/s  Systemic Diam: 2.10 cm MV A velocity: 86.50 cm/s MV E/A ratio:  1.08 Serafina Royals MD Electronically signed by Serafina Royals MD Signature Date/Time: 03/17/2021/6:29:25 AM    Final      Assessment and Recommendation  65 y.o. female with acute 9 systolic diastolic dysfunction congestive heart failure with pulmonary edema chronic kidney disease with acute kidney injury without evidence of myocardial infarction 1.  Continue carvedilol with no addition of any other medication management at this time for congestive heart failure.  Patient will have further evaluation as an outpatient for the possibility of additional medication management 2.  Continuation of torsemide at 40 mg which has adequate urine output and stability 3.  No further cardiac diagnostics necessary at this time 4.  Begin ambulation and follow-up for improvements of symptoms and okay for discharge to facility with  a follow-up in 1 month 5.  Call if further questions  Signed, Serafina Royals M.D. FACC

## 2021-03-21 LAB — BASIC METABOLIC PANEL
Anion gap: 10 (ref 5–15)
BUN: 34 mg/dL — ABNORMAL HIGH (ref 8–23)
CO2: 35 mmol/L — ABNORMAL HIGH (ref 22–32)
Calcium: 8.8 mg/dL — ABNORMAL LOW (ref 8.9–10.3)
Chloride: 93 mmol/L — ABNORMAL LOW (ref 98–111)
Creatinine, Ser: 1.1 mg/dL — ABNORMAL HIGH (ref 0.44–1.00)
GFR, Estimated: 55 mL/min — ABNORMAL LOW (ref >60)
Glucose, Bld: 110 mg/dL — ABNORMAL HIGH (ref 70–99)
Potassium: 3.8 mmol/L (ref 3.5–5.1)
Sodium: 138 mmol/L (ref 135–145)

## 2021-03-21 LAB — GLUCOSE, CAPILLARY
Glucose-Capillary: 118 mg/dL — ABNORMAL HIGH (ref 70–99)
Glucose-Capillary: 105 mg/dL — ABNORMAL HIGH (ref 70–99)
Glucose-Capillary: 111 mg/dL — ABNORMAL HIGH (ref 70–99)
Glucose-Capillary: 118 mg/dL — ABNORMAL HIGH (ref 70–99)
Glucose-Capillary: 102 mg/dL — ABNORMAL HIGH (ref 70–99)

## 2021-03-21 LAB — MAGNESIUM: Magnesium: 2.3 mg/dL (ref 1.7–2.4)

## 2021-03-21 MED ORDER — TORSEMIDE 20 MG PO TABS
40.0000 mg | ORAL_TABLET | Freq: Two times a day (BID) | ORAL | Status: DC
  Administered 2021-03-21 – 2021-03-22 (×2): 40 mg via ORAL
  Filled 2021-03-21 (×2): qty 2

## 2021-03-21 NOTE — Progress Notes (Signed)
This morning during assessment, Patient stated this morning that she wanted both side rails up.

## 2021-03-21 NOTE — Progress Notes (Signed)
[name] request Social worker to call him tomorrow with update regarding insurance issues and SNF placement.

## 2021-03-21 NOTE — Care Management Important Message (Signed)
Important Message  Patient Details  Name: Madison Mcbride MRN: [id number] Date of Birth: 1956-06-10   Medicare Important Message Given:  Yes     Dannette Barbara 03/21/2021, 3:19 PM

## 2021-03-21 NOTE — Progress Notes (Signed)
Physical Therapy Treatment Patient Details Name: Madison Mcbride MRN: [id number] DOB: 1955-11-27 Today's Date: 03/21/2021    History of Present Illness Pt admitted to [facility] on 03/14/21 for c/o SOB secondary to acute on chronic diastolic CHF exacerbation. Hypoxia at 81% improved with supplemental O2. Elevated troponin likely secondary to volume overload. Significant PMH includes: diastolic CHF, morbid obesity, hypothyroidism, T2DM, OSA, lymphedema, CKD. CXR significant for cardiomegaly with vascular congestion and mild edema.    PT Comments    Pt on BSC with NT present upon PT arrival.  After pt finished toileting, initial plan was to have pt walk and sit in recliner end of session.  Therapy plans changed though when pt had significant difficulty standing from [facility] (requiring assist and extra time) and taking steps with walker (therapist deferred getting pt to recliner and pulled bed next to [facility] and assisted pt back to bed).  Pt reporting she was tired and fatigued from sitting on BSC; pt requiring extra time and effort for all activity (when taking steps with walker back to bed, pt leaning forward and resting forearms on walker for support intermittently requiring cueing for safety).  Therapist able to assist pt back to bed safely but deferred further activity d/t pt fatigue post activity (pt's HR and O2 on 2 L via nasal cannula WFL during sessions activities).  Will continue to focus on strengthening and progressive functional mobility per pt tolerance.   Follow Up Recommendations  SNF     Equipment Recommendations  Rolling walker with 5" wheels;3in1 (PT);Wheelchair (measurements PT);Wheelchair cushion (measurements PT)    Recommendations for Other Services       Precautions / Restrictions Precautions Precautions: Fall Restrictions Weight Bearing Restrictions: No Other Position/Activity Restrictions: Strict I/O    Mobility  Bed Mobility Overal bed mobility: Needs Assistance Bed  Mobility: Sit to Supine       Sit to supine: Mod assist;HOB elevated   General bed mobility comments: assist for trunk and B LE's; pt able to scoot self up in bed using bed rails    Transfers Overall transfer level: Needs assistance Equipment used: Rolling walker (2 wheeled) Transfers: Sit to/from Stand Sit to Stand: Mod assist         General transfer comment: vc's for UE/LE placement; assist to initiate and come to full stand; assist to control descent sitting  Ambulation/Gait Ambulation/Gait assistance: Min assist Gait Distance (Feet): 3 Feet (BSC to bed) Assistive device: Rolling walker (2 wheeled)   Gait velocity: decreased   General Gait Details: significant difficulty and extra time to take steps BSC to bed; pt intermittently leaning forward and placing forearms on walker instead of hands requiring cueing for safety and upright posture/positioning   Stairs             Wheelchair Mobility    Modified Rankin (Stroke Patients Only)       Balance Overall balance assessment: Needs assistance Sitting-balance support: No upper extremity supported;Feet supported Sitting balance-Leahy Scale: Good Sitting balance - Comments: steady sitting reaching within BOS   Standing balance support: Bilateral upper extremity supported Standing balance-Leahy Scale: Fair Standing balance comment: steady static standing with B UE support on RW                            Cognition Arousal/Alertness: Awake/alert Behavior During Therapy: WFL for tasks assessed/performed Overall Cognitive Status: Within Functional Limits for tasks assessed  Exercises      General Comments General comments (skin integrity, edema, etc.): R LE with dressing in place.  Nursing cleared pt for participation in physical therapy.  Pt agreeable to PT session.      Pertinent Vitals/Pain Pain Assessment: 0-10 Pain Score: 4  Pain  Location: chronic back pain Pain Descriptors / Indicators: Aching;Sore Pain Intervention(s): Limited activity within patient's tolerance;Monitored during session;Repositioned;Other (comment) (pt declined pain meds)    Home Living                      Prior Function            PT Goals (current goals can now be found in the care plan section) Acute Rehab PT Goals Patient Stated Goal: "go home" PT Goal Formulation: With patient Time For Goal Achievement: 03/30/21 Potential to Achieve Goals: Fair Progress towards PT goals: Progressing toward goals    Frequency    Min 2X/week      PT Plan Current plan remains appropriate    Co-evaluation              AM-PAC PT "6 Clicks" Mobility   Outcome Measure  Help needed turning from your back to your side while in a flat bed without using bedrails?: A Lot Help needed moving from lying on your back to sitting on the side of a flat bed without using bedrails?: A Lot Help needed moving to and from a bed to a chair (including a wheelchair)?: A Lot Help needed standing up from a chair using your arms (e.g., wheelchair or bedside chair)?: A Lot Help needed to walk in hospital room?: Total Help needed climbing 3-5 steps with a railing? : Total 6 Click Score: 10    End of Session Equipment Utilized During Treatment: Gait belt;Oxygen Activity Tolerance: Patient limited by fatigue Patient left: in bed;with call bell/phone within reach;with bed alarm set;Other (comment) (B heels floating via pillow support) Nurse Communication: Mobility status;Precautions PT Visit Diagnosis: Unsteadiness on feet (R26.81);Muscle weakness (generalized) (M62.81);Difficulty in walking, not elsewhere classified (R26.2)     Time: [id number] PT Time Calculation (min) (ACUTE ONLY): 38 min  Charges:  $Therapeutic Activity: 38-52 mins                     Leitha Bleak, PT 03/21/21, 12:21 PM

## 2021-03-21 NOTE — Progress Notes (Signed)
PROGRESS NOTE    Madison Mcbride  [id number] DOB: Jun 07, 1956 DOA: 03/14/2021 PCP: Madison Aus, MD   Chief complaint.  Shortness of breath Brief Narrative:  Madison Mcbride is a 65 y.o. female with history of diastolic CHF, morbid obesity, hypothyroidism, type 2 diabetes, OSA, lymphedema, CKD, who presents with shortness of breath.[id number] with vascular congestion and mild edema.  She was a found to have acute on chronic diastolic congestive heart failure, her BNP was 1805, she was also required 3 L oxygen for hypoxemia.  She was placed on IV Lasix drip. Condition had improved, transitioned diuretics to oral on 9/7.  Echocardiogram showed ejection fraction 45 to 50%. she is medically stable to be discharged, pending nursing home placement.     Assessment & Plan:   Active Problems:   Type 2 diabetes mellitus with diabetic neuropathy, without long-term current use of insulin (HCC)   Hypothyroidism   Morbid obesity with BMI of 50.0-59.9, adult (HCC)   OSA (obstructive sleep apnea)   Acute hypoxemic respiratory failure (HCC)   Acute on chronic systolic CHF (congestive heart failure) (HCC)  Acute on chronic systolic congestive heart failure  Acute hypoxemic respiratory failure. Elevated troponin secondary to congestive heart failure. Coronary artery disease. Condition stable, shortness of breath improving.  Continue torsemide, increase to 40 mg twice a day.  Continue acetazolamide.  2.  Nonsustained ventricular tachycardia  Continue beta-blocker.  3.  Cute kidney injury. Renal function stable  4.  Type 2 diabetes.  Continue current regimen.   DVT prophylaxis: Lovenox Code Status: full Family Communication:  Disposition Plan:      Status is: Inpatient   Remains inpatient appropriate because:Unsafe d/c plan   Dispo: The patient is from: Home              Anticipated d/c is to: SNF              Patient currently is medically stable to d/c.               Difficult to place patient No       I/O last 3 completed shifts: In: 1980 [P.O.:1980] Out: 3000 [Urine:3000] Total I/O In: 480 [P.O.:480] Out: 400 [Urine:400]    Consultants:  cardiology   Procedures: None   Antimicrobials: None     Subjective: Patient doing well today, slept well last night.  Short of breath getting better. Still has a cough, with some clear mucus. No abdominal pain or nausea vomiting. No dysuria hematuria  No fever or chills.  Objective: Vitals:   03/21/21 0313 03/21/21 0437 03/21/21 0758 03/21/21 1213  BP:  117/73 107/67 121/68  Pulse:  77 62 78  Resp:  '18 18 18  '$ Temp:  98.1 F (36.7 C) 98.3 F (36.8 C) 98 F (36.7 C)  TempSrc:  Oral    SpO2:  91% 96% 97%  Weight: (!) 139.7 kg     Height:        Intake/Output Summary (Last 24 hours) at 03/21/2021 1448 Last data filed at 03/21/2021 1330 Gross per 24 hour  Intake 1200 ml  Output 2000 ml  Net -800 ml   Filed Weights   03/20/21 0902 03/21/21 0042 03/21/21 0313  Weight: (!) 139.2 kg (!) 139.7 kg (!) 139.7 kg    Examination:  General exam: Appears calm and comfortable, morbid obese Respiratory system: Crackles in right base. Respiratory effort normal. Cardiovascular system: S1 & S2 heard, RRR. No JVD, murmurs, rubs, gallops or clicks. No  pedal edema. Gastrointestinal system: Abdomen is nondistended, soft and nontender. No organomegaly or masses felt. Normal bowel sounds heard. Central nervous system: Alert and oriented. No focal neurological deficits. Extremities: Symmetric 5 x 5 power. Skin: No rashes, lesions or ulcers Psychiatry: Judgement and insight appear normal. Mood & affect appropriate.     Data Reviewed: I have personally reviewed following labs and imaging studies  CBC: Recent Labs  Lab 03/14/21 1817 03/15/21 0527  WBC 11.2* 10.6*  NEUTROABS 8.4*  --   HGB 13.2 12.4  HCT 45.8 43.8  MCV 83.4 83.9  PLT 429* [id number]   Basic Metabolic Panel: Recent Labs  Lab  03/17/21 0442 03/18/21 0729 03/19/21 0245 03/20/21 0415 03/21/21 0724  NA 140 141 141 142 138  K 4.2 3.9 3.5 3.9 3.8  CL 95* 91* 92* 93* 93*  CO2 39* 42* 39* 39* 35*  GLUCOSE 84 94 107* 97 110*  BUN 29* 24* 23 26* 34*  CREATININE 1.16* 1.06* 1.10* 1.11* 1.10*  CALCIUM 8.3* 8.5* 8.7* 8.8* 8.8*  MG  --  1.9 2.0 2.2 2.3   GFR: Estimated Creatinine Clearance: 70.4 mL/min (A) (by C-G formula based on SCr of 1.1 mg/dL (H)). Liver Function Tests: Recent Labs  Lab 03/14/21 1817  AST 25  ALT 13  ALKPHOS 103  BILITOT 2.0*  PROT 7.4  ALBUMIN 3.1*   No results for input(s): LIPASE, AMYLASE in the last 168 hours. No results for input(s): AMMONIA in the last 168 hours. Coagulation Profile: No results for input(s): INR, PROTIME in the last 168 hours. Cardiac Enzymes: No results for input(s): CKTOTAL, CKMB, CKMBINDEX, TROPONINI in the last 168 hours. BNP (last 3 results) No results for input(s): PROBNP in the last 8760 hours. HbA1C: No results for input(s): HGBA1C in the last 72 hours. CBG: Recent Labs  Lab 03/20/21 1602 03/20/21 1641 03/20/21 2128 03/21/21 0759 03/21/21 1214  GLUCAP 101* 100* 114* 118* 105*   Lipid Profile: No results for input(s): CHOL, HDL, LDLCALC, TRIG, CHOLHDL, LDLDIRECT in the last 72 hours. Thyroid Function Tests: No results for input(s): TSH, T4TOTAL, FREET4, T3FREE, THYROIDAB in the last 72 hours. Anemia Panel: No results for input(s): VITAMINB12, FOLATE, FERRITIN, TIBC, IRON, RETICCTPCT in the last 72 hours. Sepsis Labs: Recent Labs  Lab 03/14/21 1811  LATICACIDVEN 2.5*    Recent Results (from the past 240 hour(s))  Resp Panel by RT-PCR (Flu A&B, Covid)     Status: None   Collection Time: 03/14/21  8:21 PM   Specimen: Nasopharyngeal(NP) swabs in vial transport medium  Result Value Ref Range Status   SARS Coronavirus 2 by RT PCR NEGATIVE NEGATIVE Final    Comment: (NOTE) SARS-CoV-2 target nucleic acids are NOT DETECTED.  The SARS-CoV-2  RNA is generally detectable in upper respiratory specimens during the acute phase of infection. The lowest concentration of SARS-CoV-2 viral copies this assay can detect is 138 copies/mL. A negative result does not preclude SARS-Cov-2 infection and should not be used as the sole basis for treatment or other patient management decisions. A negative result may occur with  improper specimen collection/handling, submission of specimen other than nasopharyngeal swab, presence of viral mutation(s) within the areas targeted by this assay, and inadequate number of viral copies(<138 copies/mL). A negative result must be combined with clinical observations, patient history, and epidemiological information. The expected result is Negative.  Fact Sheet for Patients:  EntrepreneurPulse.com.au  Fact Sheet for Healthcare Providers:  IncredibleEmployment.be  This test is no t yet approved or cleared  by the [place] and  has been authorized for detection and/or diagnosis of SARS-CoV-2 by FDA under an Emergency Use Authorization (EUA). This EUA will remain  in effect (meaning this test can be used) for the duration of the COVID-19 declaration under Section 564(b)(1) of the Act, 21 U.S.C.section 360bbb-3(b)(1), unless the authorization is terminated  or revoked sooner.       Influenza A by PCR NEGATIVE NEGATIVE Final   Influenza B by PCR NEGATIVE NEGATIVE Final    Comment: (NOTE) The Xpert Xpress SARS-CoV-2/FLU/RSV plus assay is intended as an aid in the diagnosis of influenza from Nasopharyngeal swab specimens and should not be used as a sole basis for treatment. Nasal washings and aspirates are unacceptable for Xpert Xpress SARS-CoV-2/FLU/RSV testing.  Fact Sheet for Patients: EntrepreneurPulse.com.au  Fact Sheet for Healthcare Providers: IncredibleEmployment.be  This test is not yet approved or cleared by the  [place] FDA and has been authorized for detection and/or diagnosis of SARS-CoV-2 by FDA under an Emergency Use Authorization (EUA). This EUA will remain in effect (meaning this test can be used) for the duration of the COVID-19 declaration under Section 564(b)(1) of the Act, 21 U.S.C. section 360bbb-3(b)(1), unless the authorization is terminated or revoked.  Performed at [facility], [address], [place], [place] [address]          Radiology Studies: No results found.      Scheduled Meds:  acetaZOLAMIDE ER  500 mg Oral Q12H   aspirin EC  81 mg Oral Daily   atorvastatin  20 mg Oral Q2200   buPROPion  150 mg Oral Daily   carvedilol  6.25 mg Oral BID WC   gabapentin  300 mg Oral TID   guaiFENesin  600 mg Oral BID   hydrocerin   Topical BID   insulin aspart  0-9 Units Subcutaneous TID WC   levothyroxine  250 mcg Oral Q0600   rOPINIRole  3 mg Oral TID   sodium chloride flush  3 mL Intravenous Q12H   torsemide  40 mg Oral BID   Continuous Infusions:   LOS: 7 days    Time spent: 28 minutes    Sharen Hones, MD [facility]   To contact the attending provider between 7A-7P or the covering provider during after hours 7P-7A, please log into the web site [email or web address] and access using universal [place] password for that web site. If you do not have the password, please call the hospital operator.  03/21/2021, 2:48 PM

## 2021-03-22 ENCOUNTER — Inpatient Hospital Stay: Payer: Medicare Other

## 2021-03-22 DIAGNOSIS — M25462 Effusion, left knee: Secondary | ICD-10-CM

## 2021-03-22 LAB — URINALYSIS, COMPLETE (UACMP) WITH MICROSCOPIC
Bacteria, UA: NONE SEEN
Bilirubin Urine: NEGATIVE
Glucose, UA: 100 mg/dL — AB
Ketones, ur: NEGATIVE mg/dL
Nitrite: NEGATIVE
Protein, ur: >300 mg/dL — AB
Specific Gravity, Urine: 1.02 (ref 1.005–1.030)
Squamous Epithelial / HPF: NONE SEEN (ref 0–5)
WBC, UA: NONE SEEN WBC/hpf (ref 0–5)
pH: 8.5 — ABNORMAL HIGH (ref 5.0–8.0)

## 2021-03-22 LAB — GLUCOSE, CAPILLARY
Glucose-Capillary: 114 mg/dL — ABNORMAL HIGH (ref 70–99)
Glucose-Capillary: 139 mg/dL — ABNORMAL HIGH (ref 70–99)
Glucose-Capillary: 121 mg/dL — ABNORMAL HIGH (ref 70–99)
Glucose-Capillary: 171 mg/dL — ABNORMAL HIGH (ref 70–99)

## 2021-03-22 LAB — BASIC METABOLIC PANEL
Anion gap: 9 (ref 5–15)
BUN: 36 mg/dL — ABNORMAL HIGH (ref 8–23)
CO2: 32 mmol/L (ref 22–32)
Calcium: 8.4 mg/dL — ABNORMAL LOW (ref 8.9–10.3)
Chloride: 97 mmol/L — ABNORMAL LOW (ref 98–111)
Creatinine, Ser: 1.14 mg/dL — ABNORMAL HIGH (ref 0.44–1.00)
GFR, Estimated: 53 mL/min — ABNORMAL LOW (ref >60)
Glucose, Bld: 106 mg/dL — ABNORMAL HIGH (ref 70–99)
Potassium: 3.5 mmol/L (ref 3.5–5.1)
Sodium: 138 mmol/L (ref 135–145)

## 2021-03-22 LAB — BLOOD GAS, ARTERIAL
Acid-Base Excess: 11.5 mmol/L — ABNORMAL HIGH (ref 0.0–2.0)
Bicarbonate: 38.7 mmol/L — ABNORMAL HIGH (ref 20.0–28.0)
FIO2: 0.24
O2 Saturation: 94 %
Patient temperature: 37
pCO2 arterial: 61 mmHg — ABNORMAL HIGH (ref 32.0–48.0)
pH, Arterial: 7.41 (ref 7.350–7.450)
pO2, Arterial: 70 mmHg — ABNORMAL LOW (ref 83.0–108.0)

## 2021-03-22 LAB — MAGNESIUM: Magnesium: 2.3 mg/dL (ref 1.7–2.4)

## 2021-03-22 MED ORDER — TRIAMCINOLONE ACETONIDE 40 MG/ML IJ SUSP
40.0000 mg | Freq: Once | INTRAMUSCULAR | Status: DC
  Filled 2021-03-22 (×2): qty 1

## 2021-03-22 MED ORDER — BUPIVACAINE HCL (PF) 0.5 % IJ SOLN
10.0000 mL | Freq: Once | INTRAMUSCULAR | Status: DC
  Filled 2021-03-22 (×2): qty 10

## 2021-03-22 MED ORDER — TORSEMIDE 20 MG PO TABS: 40.0000 mg | ORAL_TABLET | Freq: Every day | ORAL | Status: DC

## 2021-03-22 NOTE — Progress Notes (Signed)
Pt refused Bipap.  She states she has not been using her home unit and doesn't want to use one in the hosp.

## 2021-03-22 NOTE — Procedures (Signed)
After informed consent was obtained the left superior medial aspect of the knee was prepped with swabs and Betadine.  A 21-gauge needle was then inserted into the knee and about 4 cc of fluid withdrawn.  It was cloudy in appearance and was sent for synovial fluid analysis.  The knee was then injected through the same needle with 40 mg Kenalog 5 cc half percent Sensorcaine without epinephrine.  A Band-Aid was applied and the patient tolerated it well.

## 2021-03-22 NOTE — Progress Notes (Signed)
PROGRESS NOTE    Madison Mcbride  [id number] DOB: April 13, 1956 DOA: 03/14/2021 PCP: Rusty Aus, MD   Chief complaint.  Shortness of breath. Brief Narrative:  Madison Mcbride is a 65 y.o. female with history of diastolic CHF, morbid obesity, hypothyroidism, type 2 diabetes, OSA, lymphedema, CKD, who presents with shortness of breath.[id number] with vascular congestion and mild edema.  She was a found to have acute on chronic diastolic congestive heart failure, her BNP was 1805, she was also required 3 L oxygen for hypoxemia.  She was placed on IV Lasix drip. Condition had improved, transitioned diuretics to oral on 9/7.  Echocardiogram showed ejection fraction 45 to 50%. she is medically stable to be discharged, pending nursing home placement.   Assessment & Plan:   Active Problems:   Type 2 diabetes mellitus with diabetic neuropathy, without long-term current use of insulin (HCC)   Hypothyroidism   Morbid obesity with BMI of 50.0-59.9, adult (HCC)   OSA (obstructive sleep apnea)   Acute hypoxemic respiratory failure (HCC)   Acute on chronic systolic CHF (congestive heart failure) (HCC)  Acute on chronic systolic congestive heart failure  Acute hypoxemic respiratory failure. Elevated troponin secondary to congestive heart failure. Coronary artery disease. Condition is improving, patient is still on 2 L oxygen, short of breath had improved.  Continue torsemide 40 mg daily, continue acetazolamide.  Continue to monitor renal function and magnesium.  Left knee joint effusion. Exam showed patient has a large joint effusion, confirmed by x-ray.  Obtain consult from orthopedics.  Does not feel there is any infection.  Nonsustained ventricular tachycardia. Continue beta-blocker.  Acute kidney injury. Renal function still stable.  Type 2 diabetes.  Continue sliding scale insulin.   DVT prophylaxis: Lovenox Code Status: full Family Communication: husband  updated Disposition Plan:      Status is: Inpatient   Remains inpatient appropriate because:Unsafe d/c plan   Dispo: The patient is from: Home              Anticipated d/c is to: SNF              Patient currently is medically stable to d/c.              Difficult to place patient No  Discussed with social worker, patient probably will stay until Monday before discharge pending bed.      I/O last 3 completed shifts: In: 1500 [P.O.:1500] Out: 4600 [Urine:4600] Total I/O In: 480 [P.O.:480] Out: 1150 [Urine:1150]     Consultants:  Orthopedics  Procedures: None  Antimicrobials: None  Subjective: Patient is still on 2 L oxygen, but feels much better.  She slept well last night without any orthopnea or paroxysmal nocturnal dyspnea. Leg edema essentially resolved. No abdominal pain or nausea vomiting. She complaining of left knee pain and swelling. She has no fever or chills. She has no headache or dizziness.  Objective: Vitals:   03/22/21 0308 03/22/21 0401 03/22/21 0749 03/22/21 1142  BP:  109/75 128/72 137/69  Pulse:  (!) 58 88 69  Resp:  '18 17 17  '$ Temp:  98.1 F (36.7 C) 98.2 F (36.8 C) 98.3 F (36.8 C)  TempSrc:   Oral Oral  SpO2:  92% 93% 92%  Weight: (!) 139.6 kg     Height:        Intake/Output Summary (Last 24 hours) at 03/22/2021 1151 Last data filed at 03/22/2021 1100 Gross per 24 hour  Intake 1740 ml  Output 4750 ml  Net -3010 ml   Filed Weights   03/21/21 0042 03/21/21 0313 03/22/21 0308  Weight: (!) 139.7 kg (!) 139.7 kg (!) 139.6 kg    Examination:  General exam: Appears calm and comfortable  Respiratory system: Clear to auscultation. Respiratory effort normal. Cardiovascular system: S1 & S2 heard, RRR. No JVD, murmurs, rubs, gallops or clicks. No pedal edema. Gastrointestinal system: Abdomen is nondistended, soft and nontender. No organomegaly or masses felt. Normal bowel sounds heard. Central nervous system: Alert and oriented. No  focal neurological deficits. Extremities: Left knee swelling with joint effusion. Skin: No rashes, lesions or ulcers Psychiatry: Judgement and insight appear normal. Mood & affect appropriate.     Data Reviewed: I have personally reviewed following labs and imaging studies  CBC: No results for input(s): WBC, NEUTROABS, HGB, HCT, MCV, PLT in the last 168 hours. Basic Metabolic Panel: Recent Labs  Lab 03/18/21 0729 03/19/21 0245 03/20/21 0415 03/21/21 0724 03/22/21 0549  NA 141 141 142 138 138  K 3.9 3.5 3.9 3.8 3.5  CL 91* 92* 93* 93* 97*  CO2 42* 39* 39* 35* 32  GLUCOSE 94 107* 97 110* 106*  BUN 24* 23 26* 34* 36*  CREATININE 1.06* 1.10* 1.11* 1.10* 1.14*  CALCIUM 8.5* 8.7* 8.8* 8.8* 8.4*  MG 1.9 2.0 2.2 2.3 2.3   GFR: Estimated Creatinine Clearance: 68 mL/min (A) (by C-G formula based on SCr of 1.14 mg/dL (H)). Liver Function Tests: No results for input(s): AST, ALT, ALKPHOS, BILITOT, PROT, ALBUMIN in the last 168 hours. No results for input(s): LIPASE, AMYLASE in the last 168 hours. No results for input(s): AMMONIA in the last 168 hours. Coagulation Profile: No results for input(s): INR, PROTIME in the last 168 hours. Cardiac Enzymes: No results for input(s): CKTOTAL, CKMB, CKMBINDEX, TROPONINI in the last 168 hours. BNP (last 3 results) No results for input(s): PROBNP in the last 8760 hours. HbA1C: No results for input(s): HGBA1C in the last 72 hours. CBG: Recent Labs  Lab 03/21/21 1538 03/21/21 1728 03/21/21 2147 03/22/21 0722 03/22/21 1109  GLUCAP 111* 118* 102* 114* 139*   Lipid Profile: No results for input(s): CHOL, HDL, LDLCALC, TRIG, CHOLHDL, LDLDIRECT in the last 72 hours. Thyroid Function Tests: No results for input(s): TSH, T4TOTAL, FREET4, T3FREE, THYROIDAB in the last 72 hours. Anemia Panel: No results for input(s): VITAMINB12, FOLATE, FERRITIN, TIBC, IRON, RETICCTPCT in the last 72 hours. Sepsis Labs: No results for input(s): PROCALCITON,  LATICACIDVEN in the last 168 hours.  Recent Results (from the past 240 hour(s))  Resp Panel by RT-PCR (Flu A&B, Covid)     Status: None   Collection Time: 03/14/21  8:21 PM   Specimen: Nasopharyngeal(NP) swabs in vial transport medium  Result Value Ref Range Status   SARS Coronavirus 2 by RT PCR NEGATIVE NEGATIVE Final    Comment: (NOTE) SARS-CoV-2 target nucleic acids are NOT DETECTED.  The SARS-CoV-2 RNA is generally detectable in upper respiratory specimens during the acute phase of infection. The lowest concentration of SARS-CoV-2 viral copies this assay can detect is 138 copies/mL. A negative result does not preclude SARS-Cov-2 infection and should not be used as the sole basis for treatment or other patient management decisions. A negative result may occur with  improper specimen collection/handling, submission of specimen other than nasopharyngeal swab, presence of viral mutation(s) within the areas targeted by this assay, and inadequate number of viral copies(<138 copies/mL). A negative result must be combined with clinical observations, patient history, and epidemiological information. The expected  result is Negative.  Fact Sheet for Patients:  EntrepreneurPulse.com.au  Fact Sheet for Healthcare Providers:  IncredibleEmployment.be  This test is no t yet approved or cleared by the [place] FDA and  has been authorized for detection and/or diagnosis of SARS-CoV-2 by FDA under an Emergency Use Authorization (EUA). This EUA will remain  in effect (meaning this test can be used) for the duration of the COVID-19 declaration under Section 564(b)(1) of the Act, 21 U.S.C.section 360bbb-3(b)(1), unless the authorization is terminated  or revoked sooner.       Influenza A by PCR NEGATIVE NEGATIVE Final   Influenza B by PCR NEGATIVE NEGATIVE Final    Comment: (NOTE) The Xpert Xpress SARS-CoV-2/FLU/RSV plus assay is intended as an aid in  the diagnosis of influenza from Nasopharyngeal swab specimens and should not be used as a sole basis for treatment. Nasal washings and aspirates are unacceptable for Xpert Xpress SARS-CoV-2/FLU/RSV testing.  Fact Sheet for Patients: EntrepreneurPulse.com.au  Fact Sheet for Healthcare Providers: IncredibleEmployment.be  This test is not yet approved or cleared by the [place] FDA and has been authorized for detection and/or diagnosis of SARS-CoV-2 by FDA under an Emergency Use Authorization (EUA). This EUA will remain in effect (meaning this test can be used) for the duration of the COVID-19 declaration under Section 564(b)(1) of the Act, 21 U.S.C. section 360bbb-3(b)(1), unless the authorization is terminated or revoked.  Performed at [facility], [address] [place], [place] [address]          Radiology Studies: DG Knee 3 Views Left  Result Date: 03/22/2021 CLINICAL DATA:  65 year old female with knee pain EXAM: LEFT KNEE - 3 VIEW COMPARISON:  None FINDINGS: No acute displaced fracture. Large joint effusion. Medial and lateral joint space narrowing with eburnation of the femoral condyles and tibial plateau and marginal osteophyte formation. Degenerative changes at the patellofemoral joint atherosclerosis. No radiopaque foreign body. IMPRESSION: No acute bony abnormality. Large joint effusion. Advanced tricompartmental osteoarthritis. Electronically Signed   By: Corrie Mckusick D.O.   On: 03/22/2021 10:19        Scheduled Meds:  acetaZOLAMIDE ER  500 mg Oral Q12H   aspirin EC  81 mg Oral Daily   atorvastatin  20 mg Oral Q2200   buPROPion  150 mg Oral Daily   carvedilol  6.25 mg Oral BID WC   gabapentin  300 mg Oral TID   guaiFENesin  600 mg Oral BID   hydrocerin   Topical BID   insulin aspart  0-9 Units Subcutaneous TID WC   levothyroxine  250 mcg Oral Q0600   rOPINIRole  3 mg Oral TID   sodium chloride flush  3 mL  Intravenous Q12H   [START ON 03/23/2021] torsemide  40 mg Oral Daily   Continuous Infusions:   LOS: 8 days    Time spent: 32 minutes.  More than 50% time involved in direct patient care.    Sharen Hones, MD [facility]   To contact the attending provider between 7A-7P or the covering provider during after hours 7P-7A, please log into the web site [email or web address] and access using universal [place] password for that web site. If you do not have the password, please call the hospital operator.  03/22/2021, 11:51 AM

## 2021-03-22 NOTE — Consult Note (Signed)
Reason for Consult: Left knee pain and swelling Referring Physician: Dr. Orson Gear Madison Mcbride is an 65 y.o. female.  HPI: Patient is a 65 year old who was admitted with CHF.  She has chronic kidney disease as well as type 2 diabetes.  That is improved but now she is having trouble with her left knee.  She had x-rays taken that show large effusion as well as significant osteoarthritis in a nonweightbearing film.  She describes pain with motion and difficulty bearing weight.  Past Medical History:  Diagnosis Date   Diabetes mellitus without complication ([place])    Hypothyroidism    RLS (restless legs syndrome)    Thyroid cancer ([place]) 2007    Past Surgical History:  Procedure Laterality Date   ABDOMINAL HYSTERECTOMY     COLONOSCOPY WITH PROPOFOL N/A 08/26/2015   Procedure: COLONOSCOPY WITH PROPOFOL;  Surgeon: Madison Silvas, MD;  Location: [facility];  Service: Endoscopy;  Laterality: N/A;   THYROIDECTOMY      Family History  Problem Relation Age of Onset   Breast cancer Neg Hx     Social History:  reports that she has never smoked. She has never used smokeless tobacco. She reports that she does not drink alcohol and does not use drugs.  Allergies: No Known Allergies  Medications: I have reviewed the patient's current medications.  Results for orders placed or performed during the hospital encounter of 03/14/21 (from the past 48 hour(s))  Glucose, capillary     Status: Abnormal   Collection Time: 03/20/21  4:02 PM  Result Value Ref Range   Glucose-Capillary 101 (H) 70 - 99 mg/dL    Comment: Glucose reference range applies only to samples taken after fasting for at least 8 hours.  Glucose, capillary     Status: Abnormal   Collection Time: 03/20/21  4:41 PM  Result Value Ref Range   Glucose-Capillary 100 (H) 70 - 99 mg/dL    Comment: Glucose reference range applies only to samples taken after fasting for at least 8 hours.  Glucose, capillary     Status: Abnormal    Collection Time: 03/20/21  9:28 PM  Result Value Ref Range   Glucose-Capillary 114 (H) 70 - 99 mg/dL    Comment: Glucose reference range applies only to samples taken after fasting for at least 8 hours.  Basic metabolic panel     Status: Abnormal   Collection Time: 03/21/21  7:24 AM  Result Value Ref Range   Sodium 138 135 - 145 mmol/L   Potassium 3.8 3.5 - 5.1 mmol/L   Chloride 93 (L) 98 - 111 mmol/L   CO2 35 (H) 22 - 32 mmol/L   Glucose, Bld 110 (H) 70 - 99 mg/dL    Comment: Glucose reference range applies only to samples taken after fasting for at least 8 hours.   BUN 34 (H) 8 - 23 mg/dL   Creatinine, Ser 1.10 (H) 0.44 - 1.00 mg/dL   Calcium 8.8 (L) 8.9 - 10.3 mg/dL   GFR, Estimated 55 (L) >60 mL/min    Comment: (NOTE) Calculated using the CKD-EPI Creatinine Equation (2021)    Anion gap 10 5 - 15    Comment: Performed at [facility], [address] [place], [place] [address]  Magnesium     Status: None   Collection Time: 03/21/21  7:24 AM  Result Value Ref Range   Magnesium 2.3 1.7 - 2.4 mg/dL    Comment: Performed at [facility], [address] [place], [place] [address]  Glucose, capillary     Status: Abnormal   Collection Time: 03/21/21  7:59 AM  Result Value Ref Range   Glucose-Capillary 118 (H) 70 - 99 mg/dL    Comment: Glucose reference range applies only to samples taken after fasting for at least 8 hours.  Glucose, capillary     Status: Abnormal   Collection Time: 03/21/21 12:14 PM  Result Value Ref Range   Glucose-Capillary 105 (H) 70 - 99 mg/dL    Comment: Glucose reference range applies only to samples taken after fasting for at least 8 hours.  Glucose, capillary     Status: Abnormal   Collection Time: 03/21/21  3:38 PM  Result Value Ref Range   Glucose-Capillary 111 (H) 70 - 99 mg/dL    Comment: Glucose reference range applies only to samples taken after fasting for at least 8 hours.  Glucose, capillary     Status: Abnormal    Collection Time: 03/21/21  5:28 PM  Result Value Ref Range   Glucose-Capillary 118 (H) 70 - 99 mg/dL    Comment: Glucose reference range applies only to samples taken after fasting for at least 8 hours.  Glucose, capillary     Status: Abnormal   Collection Time: 03/21/21  9:47 PM  Result Value Ref Range   Glucose-Capillary 102 (H) 70 - 99 mg/dL    Comment: Glucose reference range applies only to samples taken after fasting for at least 8 hours.  Basic metabolic panel     Status: Abnormal   Collection Time: 03/22/21  5:49 AM  Result Value Ref Range   Sodium 138 135 - 145 mmol/L   Potassium 3.5 3.5 - 5.1 mmol/L   Chloride 97 (L) 98 - 111 mmol/L   CO2 32 22 - 32 mmol/L   Glucose, Bld 106 (H) 70 - 99 mg/dL    Comment: Glucose reference range applies only to samples taken after fasting for at least 8 hours.   BUN 36 (H) 8 - 23 mg/dL   Creatinine, Ser 1.14 (H) 0.44 - 1.00 mg/dL   Calcium 8.4 (L) 8.9 - 10.3 mg/dL   GFR, Estimated 53 (L) >60 mL/min    Comment: (NOTE) Calculated using the CKD-EPI Creatinine Equation (2021)    Anion gap 9 5 - 15    Comment: Performed at [facility], [address] [place], [place] [address]  Magnesium     Status: None   Collection Time: 03/22/21  5:49 AM  Result Value Ref Range   Magnesium 2.3 1.7 - 2.4 mg/dL    Comment: Performed at [facility], [address] [place], [place] [address]  Glucose, capillary     Status: Abnormal   Collection Time: 03/22/21  7:22 AM  Result Value Ref Range   Glucose-Capillary 114 (H) 70 - 99 mg/dL    Comment: Glucose reference range applies only to samples taken after fasting for at least 8 hours.  Glucose, capillary     Status: Abnormal   Collection Time: 03/22/21 11:09 AM  Result Value Ref Range   Glucose-Capillary 139 (H) 70 - 99 mg/dL    Comment: Glucose reference range applies only to samples taken after fasting for at least 8 hours.    DG Knee 3 Views Left  Result Date:  03/22/2021 CLINICAL DATA:  65 year old female with knee pain EXAM: LEFT KNEE - 3 VIEW COMPARISON:  None FINDINGS: No acute displaced fracture. Large joint effusion. Medial and lateral joint space narrowing  with eburnation of the femoral condyles and tibial plateau and marginal osteophyte formation. Degenerative changes at the patellofemoral joint atherosclerosis. No radiopaque foreign body. IMPRESSION: No acute bony abnormality. Large joint effusion. Advanced tricompartmental osteoarthritis. Electronically Signed   By: Corrie Mckusick D.O.   On: 03/22/2021 10:19    Review of Systems Blood pressure 137/69, pulse 69, temperature 98.3 F (36.8 C), temperature source Oral, resp. rate 17, height '5\' 4"'$  (1.626 m), weight (!) 139.6 kg, SpO2 92 %. Physical Exam There is a palpable effusion to the knee noted she holds the knee in about 30 degrees flexion motion to get extension and flexion is painful.  Skin is intact around the knee she does have significant venous stasis changes from the proximal two thirds down in her legs as well as lymphedema. Assessment/Plan: Left knee osteoarthritis with large effusion limiting motion.  Plan is for aspiration and injection in hopes that this will relieve pain and allow her to mobilize better.  Madison Mcbride 03/22/2021, 1:30 PM

## 2021-03-22 NOTE — TOC Progression Note (Signed)
Transition of Care [facility]) - Progression Note    Patient Details  Name: Madison Mcbride MRN: [id number] Date of Birth: 1956-02-15  Transition of Care [facility]) CM/SW [place], [place] Phone Number: 03/22/2021, 9:07 AM  Clinical Narrative:     Update: [facility] did respond that they had submitted additional information to [facility] yesterday 9/9 and have not heard back today on insurance auth. Auth still pending at this time .     CSW reached out to [facility] Otila Kluver for an update on [employer] authorization, pending response.   CSW did speak with patient's husband and provided an update that we are still pending auth at this time. He expressed no questions or concerns at this time.    Expected Discharge Plan:  (to be determined) Barriers to Discharge: Continued Medical Work up  Expected Discharge Plan and Services Expected Discharge Plan:  (to be determined)       Living arrangements for the past 2 months: Single Family Home                                       Social Determinants of Health (SDOH) Interventions    Readmission Risk Interventions No flowsheet data found.

## 2021-03-23 DIAGNOSIS — M25462 Effusion, left knee: Secondary | ICD-10-CM

## 2021-03-23 LAB — MAGNESIUM: Magnesium: 2.6 mg/dL — ABNORMAL HIGH (ref 1.7–2.4)

## 2021-03-23 LAB — BASIC METABOLIC PANEL
Anion gap: 9 (ref 5–15)
BUN: 42 mg/dL — ABNORMAL HIGH (ref 8–23)
CO2: 33 mmol/L — ABNORMAL HIGH (ref 22–32)
Calcium: 8.4 mg/dL — ABNORMAL LOW (ref 8.9–10.3)
Chloride: 98 mmol/L (ref 98–111)
Creatinine, Ser: 1.29 mg/dL — ABNORMAL HIGH (ref 0.44–1.00)
GFR, Estimated: 46 mL/min — ABNORMAL LOW (ref >60)
Glucose, Bld: 171 mg/dL — ABNORMAL HIGH (ref 70–99)
Potassium: 3.6 mmol/L (ref 3.5–5.1)
Sodium: 140 mmol/L (ref 135–145)

## 2021-03-23 LAB — GLUCOSE, CAPILLARY
Glucose-Capillary: 137 mg/dL — ABNORMAL HIGH (ref 70–99)
Glucose-Capillary: 148 mg/dL — ABNORMAL HIGH (ref 70–99)
Glucose-Capillary: 147 mg/dL — ABNORMAL HIGH (ref 70–99)
Glucose-Capillary: 168 mg/dL — ABNORMAL HIGH (ref 70–99)

## 2021-03-23 MED ORDER — INFLUENZA VAC A&B SA ADJ QUAD 0.5 ML IM PRSY
0.5000 mL | PREFILLED_SYRINGE | INTRAMUSCULAR | Status: DC
  Filled 2021-03-23: qty 0.5

## 2021-03-23 NOTE — Progress Notes (Signed)
PROGRESS NOTE    Madison Mcbride  [id number] DOB: 10-Apr-1956 DOA: 03/14/2021 PCP: Rusty Aus, MD   Chief complaint.  Shortness of breath. Brief Narrative:  Madison Mcbride is a 65 y.o. female with history of diastolic CHF, morbid obesity, hypothyroidism, type 2 diabetes, OSA, lymphedema, CKD, who presents with shortness of breath.[id number] with vascular congestion and mild edema.  She was a found to have acute on chronic diastolic congestive heart failure, her BNP was 1805, she was also required 3 L oxygen for hypoxemia.  She was placed on IV Lasix drip. Condition had improved, transitioned diuretics to oral on 9/7.  Echocardiogram showed ejection fraction 45 to 50%. she is medically stable to be discharged, pending nursing home placement. Patient had an episode of confusion at the evening of a 9/10, restart nocturnal BiPAP.   Assessment & Plan:   Active Problems:   Type 2 diabetes mellitus with diabetic neuropathy, without long-term current use of insulin (HCC)   Hypothyroidism   Morbid obesity with BMI of 50.0-59.9, adult (HCC)   OSA (obstructive sleep apnea)   Acute hypoxemic respiratory failure (HCC)   Acute on chronic systolic CHF (congestive heart failure) (HCC)   Effusion of knee joint, left  Acute on chronic systolic congestive heart failure  Acute hypoxemic respiratory failure. Elevated troponin secondary to congestive heart failure. Coronary artery disease. Obstructive sleep apnea. Morbid obesity. Patient condition had improved, still on 2 L oxygen.  Patient may need oxygen at time of discharge.  Patient was continued on oral torsemide and acetazolamide.  Renal function is slightly worse today, will hold diuretics today, may restart torsemide tomorrow. Patient had an episode of confusion yesterday, UA did not show evidence of UTI.  ABG showed significant CO2 retention but pH normal.  Patient had significant upper tract sleep apnea, he refused BiPAP  yesterday, I spoke with nurse, she will need to be on BiPAP tonight.  Left knee joint effusion. Patient has been seen by orthopedics, joint aspiration removed 5 mL IV fluids.  Initial study does not support infection.  We will continue symptomatic treatment.  Nonsustained ventricular tachycardia. Continue beta-blocker for  Acute kidney injury. Renal function still stable on diuretics.  Type 2 diabetes. Continue sliding scale insulin.     DVT prophylaxis: Lovenox Code Status: Full Family Communication:  Disposition Plan:    Status is: Inpatient  Remains inpatient appropriate because:Altered mental status and Inpatient level of care appropriate due to severity of illness  Dispo: The patient is from: Home              Anticipated d/c is to: SNF              Patient currently is not medically stable to d/c.   Difficult to place patient No        I/O last 3 completed shifts: In: 903 [P.O.:900; I.V.:3] Out: 4700 [Urine:4700] No intake/output data recorded.     Consultants:  Orthopedics  Procedures: Knee aspiration  Antimicrobials: None  Subjective: Patient had an episode of confusion yesterday evening, ABG showed chronic CO2 retention with normal pH.  She refused BiPAP last night. She feels better today, she is not confused. She is still on 2 L oxygen, does not feel short of breath. Cough, nonproductive. No fever or chills. No dysuria hematuria. No headache or dizziness.  Objective: Vitals:   03/23/21 0044 03/23/21 0440 03/23/21 0506 03/23/21 0725  BP: 107/64 114/67  118/72  Pulse: 60 64  66  Resp:  18  18  Temp: 98.2 F (36.8 C) 98.5 F (36.9 C)  98.3 F (36.8 C)  TempSrc:      SpO2: 94% 92%  93%  Weight:   135.4 kg   Height:        Intake/Output Summary (Last 24 hours) at 03/23/2021 1137 Last data filed at 03/23/2021 0507 Gross per 24 hour  Intake 243 ml  Output 2450 ml  Net -2207 ml   Filed Weights   03/21/21 0313 03/22/21 0308 03/23/21  0506  Weight: (!) 139.7 kg (!) 139.6 kg 135.4 kg    Examination:  General exam: Appears calm and comfortable, morbid obese. Respiratory system: Decreased breathing sounds. Respiratory effort normal. Cardiovascular system: S1 & S2 heard, RRR. No JVD, murmurs, rubs, gallops or clicks. No pedal edema. Gastrointestinal system: Abdomen is nondistended, soft and nontender. No organomegaly or masses felt. Normal bowel sounds heard. Central nervous system: Alert and oriented x3. No focal neurological deficits. Extremities: Symmetric 5 x 5 power. Skin: No rashes, lesions or ulcers Psychiatry: Mood & affect appropriate.     Data Reviewed: I have personally reviewed following labs and imaging studies  CBC: No results for input(s): WBC, NEUTROABS, HGB, HCT, MCV, PLT in the last 168 hours. Basic Metabolic Panel: Recent Labs  Lab 03/19/21 0245 03/20/21 0415 03/21/21 0724 03/22/21 0549 03/23/21 0415  NA 141 142 138 138 140  K 3.5 3.9 3.8 3.5 3.6  CL 92* 93* 93* 97* 98  CO2 39* 39* 35* 32 33*  GLUCOSE 107* 97 110* 106* 171*  BUN 23 26* 34* 36* 42*  CREATININE 1.10* 1.11* 1.10* 1.14* 1.29*  CALCIUM 8.7* 8.8* 8.8* 8.4* 8.4*  MG 2.0 2.2 2.3 2.3 2.6*   GFR: Estimated Creatinine Clearance: 58.9 mL/min (A) (by C-G formula based on SCr of 1.29 mg/dL (H)). Liver Function Tests: No results for input(s): AST, ALT, ALKPHOS, BILITOT, PROT, ALBUMIN in the last 168 hours. No results for input(s): LIPASE, AMYLASE in the last 168 hours. No results for input(s): AMMONIA in the last 168 hours. Coagulation Profile: No results for input(s): INR, PROTIME in the last 168 hours. Cardiac Enzymes: No results for input(s): CKTOTAL, CKMB, CKMBINDEX, TROPONINI in the last 168 hours. BNP (last 3 results) No results for input(s): PROBNP in the last 8760 hours. HbA1C: No results for input(s): HGBA1C in the last 72 hours. CBG: Recent Labs  Lab 03/22/21 0722 03/22/21 1109 03/22/21 1627 03/22/21 2053  03/23/21 0729  GLUCAP 114* 139* 121* 171* 137*   Lipid Profile: No results for input(s): CHOL, HDL, LDLCALC, TRIG, CHOLHDL, LDLDIRECT in the last 72 hours. Thyroid Function Tests: No results for input(s): TSH, T4TOTAL, FREET4, T3FREE, THYROIDAB in the last 72 hours. Anemia Panel: No results for input(s): VITAMINB12, FOLATE, FERRITIN, TIBC, IRON, RETICCTPCT in the last 72 hours. Sepsis Labs: No results for input(s): PROCALCITON, LATICACIDVEN in the last 168 hours.  Recent Results (from the past 240 hour(s))  Resp Panel by RT-PCR (Flu A&B, Covid)     Status: None   Collection Time: 03/14/21  8:21 PM   Specimen: Nasopharyngeal(NP) swabs in vial transport medium  Result Value Ref Range Status   SARS Coronavirus 2 by RT PCR NEGATIVE NEGATIVE Final    Comment: (NOTE) SARS-CoV-2 target nucleic acids are NOT DETECTED.  The SARS-CoV-2 RNA is generally detectable in upper respiratory specimens during the acute phase of infection. The lowest concentration of SARS-CoV-2 viral copies this assay can detect is 138 copies/mL. A negative result does not preclude SARS-Cov-2  infection and should not be used as the sole basis for treatment or other patient management decisions. A negative result may occur with  improper specimen collection/handling, submission of specimen other than nasopharyngeal swab, presence of viral mutation(s) within the areas targeted by this assay, and inadequate number of viral copies(<138 copies/mL). A negative result must be combined with clinical observations, patient history, and epidemiological information. The expected result is Negative.  Fact Sheet for Patients:  EntrepreneurPulse.com.au  Fact Sheet for Healthcare Providers:  IncredibleEmployment.be  This test is no t yet approved or cleared by the [place] FDA and  has been authorized for detection and/or diagnosis of SARS-CoV-2 by FDA under an Emergency Use  Authorization (EUA). This EUA will remain  in effect (meaning this test can be used) for the duration of the COVID-19 declaration under Section 564(b)(1) of the Act, 21 U.S.C.section 360bbb-3(b)(1), unless the authorization is terminated  or revoked sooner.       Influenza A by PCR NEGATIVE NEGATIVE Final   Influenza B by PCR NEGATIVE NEGATIVE Final    Comment: (NOTE) The Xpert Xpress SARS-CoV-2/FLU/RSV plus assay is intended as an aid in the diagnosis of influenza from Nasopharyngeal swab specimens and should not be used as a sole basis for treatment. Nasal washings and aspirates are unacceptable for Xpert Xpress SARS-CoV-2/FLU/RSV testing.  Fact Sheet for Patients: EntrepreneurPulse.com.au  Fact Sheet for Healthcare Providers: IncredibleEmployment.be  This test is not yet approved or cleared by the [place] FDA and has been authorized for detection and/or diagnosis of SARS-CoV-2 by FDA under an Emergency Use Authorization (EUA). This EUA will remain in effect (meaning this test can be used) for the duration of the COVID-19 declaration under Section 564(b)(1) of the Act, 21 U.S.C. section 360bbb-3(b)(1), unless the authorization is terminated or revoked.  Performed at [facility], [address], [place], [place] [address]          Radiology Studies: DG Knee 3 Views Left  Result Date: 03/22/2021 CLINICAL DATA:  65 year old female with knee pain EXAM: LEFT KNEE - 3 VIEW COMPARISON:  None FINDINGS: No acute displaced fracture. Large joint effusion. Medial and lateral joint space narrowing with eburnation of the femoral condyles and tibial plateau and marginal osteophyte formation. Degenerative changes at the patellofemoral joint atherosclerosis. No radiopaque foreign body. IMPRESSION: No acute bony abnormality. Large joint effusion. Advanced tricompartmental osteoarthritis. Electronically Signed   By: Corrie Mckusick D.O.    On: 03/22/2021 10:19        Scheduled Meds:  aspirin EC  81 mg Oral Daily   atorvastatin  20 mg Oral Q2200   bupivacaine  10 mL Infiltration Once   buPROPion  150 mg Oral Daily   carvedilol  6.25 mg Oral BID WC   gabapentin  300 mg Oral TID   guaiFENesin  600 mg Oral BID   hydrocerin   Topical BID   insulin aspart  0-9 Units Subcutaneous TID WC   levothyroxine  250 mcg Oral Q0600   rOPINIRole  3 mg Oral TID   sodium chloride flush  3 mL Intravenous Q12H   triamcinolone acetonide  40 mg Intra-articular Once   Continuous Infusions:   LOS: 9 days    Time spent: 32 minutes    Sharen Hones, MD [facility]   To contact the attending provider between 7A-7P or the covering provider during after hours 7P-7A, please log into the web site [email or web address] and access using universal [place] password for that web site.  If you do not have the password, please call the hospital operator.  03/23/2021, 11:37 AM

## 2021-03-24 LAB — BASIC METABOLIC PANEL
Anion gap: 10 (ref 5–15)
BUN: 50 mg/dL — ABNORMAL HIGH (ref 8–23)
CO2: 31 mmol/L (ref 22–32)
Calcium: 8.4 mg/dL — ABNORMAL LOW (ref 8.9–10.3)
Chloride: 100 mmol/L (ref 98–111)
Creatinine, Ser: 1.22 mg/dL — ABNORMAL HIGH (ref 0.44–1.00)
GFR, Estimated: 49 mL/min — ABNORMAL LOW (ref >60)
Glucose, Bld: 119 mg/dL — ABNORMAL HIGH (ref 70–99)
Potassium: 3.4 mmol/L — ABNORMAL LOW (ref 3.5–5.1)
Sodium: 141 mmol/L (ref 135–145)

## 2021-03-24 LAB — GLUCOSE, CAPILLARY
Glucose-Capillary: 131 mg/dL — ABNORMAL HIGH (ref 70–99)
Glucose-Capillary: 124 mg/dL — ABNORMAL HIGH (ref 70–99)

## 2021-03-24 LAB — RESP PANEL BY RT-PCR (FLU A&B, COVID) ARPGX2
Influenza A by PCR: NEGATIVE
Influenza B by PCR: NEGATIVE
SARS Coronavirus 2 by RT PCR: NEGATIVE

## 2021-03-24 LAB — MAGNESIUM: Magnesium: 2.6 mg/dL — ABNORMAL HIGH (ref 1.7–2.4)

## 2021-03-24 MED ORDER — GABAPENTIN 50 MG PO TABS: 300.0000 mg | ORAL_TABLET | Freq: Three times a day (TID) | ORAL | Status: DC

## 2021-03-24 MED ORDER — POTASSIUM CHLORIDE CRYS ER 20 MEQ PO TBCR
40.0000 meq | EXTENDED_RELEASE_TABLET | Freq: Once | ORAL | Status: AC
  Administered 2021-03-24: 40 meq via ORAL
  Filled 2021-03-24: qty 4

## 2021-03-24 MED ORDER — POTASSIUM CHLORIDE CRYS ER 20 MEQ PO TBCR: 40.0000 meq | EXTENDED_RELEASE_TABLET | Freq: Every day | ORAL | 0 refills | Status: DC

## 2021-03-24 MED ORDER — TORSEMIDE 40 MG PO TABS: 40.0000 mg | ORAL_TABLET | Freq: Every day | ORAL | Status: DC

## 2021-03-24 MED ORDER — CARVEDILOL 6.25 MG PO TABS: 6.2500 mg | ORAL_TABLET | Freq: Two times a day (BID) | ORAL | Status: DC

## 2021-03-24 NOTE — Progress Notes (Signed)
Gave report to [facility] regarding patient. Patient was stable and alert x4. EMS transporting her to rehab facility.

## 2021-03-24 NOTE — TOC Transition Note (Signed)
Transition of Care [facility]) - CM/SW Discharge Note   Patient Details  Name: Hosanna Moulin MRN: [id number] Date of Birth: April 01, 1956  [facility] CM/SW Contact:  Eileen Stanford, LCSW Phone Number: 03/24/2021, 1:31 PM   Clinical Narrative:  Clinical Social Worker facilitated patient discharge including contacting patient family and facility to confirm patient discharge plans.  Clinical information faxed to facility and family agreeable with plan.  CSW arranged ambulance transport via ACMES to [facility] (room 506).  RN to call [phone] for report prior to discharge.     Final next level of care: Skilled Nursing Facility Barriers to Discharge: No Barriers Identified   Patient Goals and CMS Choice Patient states their goals for this hospitalization and ongoing recovery are:: to go home CMS Medicare.gov Compare Post Acute Care list provided to:: Patient Choice offered to / list presented to : Patient  Discharge Placement              [facility] at:  [facility]) Patient to be transferred to facility by: [facility]   Patient and family notified of of transfer: 03/24/21  Discharge Plan and Services                                     Social Determinants of Health (SDOH) Interventions     Readmission Risk Interventions No flowsheet data found.

## 2021-03-24 NOTE — Care Management Important Message (Signed)
Important Message  Patient Details  Name: Madison Mcbride MRN: [id number] Date of Birth: 1955/09/14   Medicare Important Message Given:  Yes     Dannette Barbara 03/24/2021, 1:03 PM

## 2021-03-24 NOTE — Discharge Summary (Addendum)
[facility] NAME: Madison Mcbride    MR#:  [id number]  DATE OF BIRTH:  17-Jul-1955  DATE OF ADMISSION:  03/14/2021   ADMITTING PHYSICIAN: 03/24/2021  DATE OF DISCHARGE: 03/24/2021  PRIMARY CARE PHYSICIAN: Rusty Aus, MD   ADMISSION DIAGNOSIS:  Acute on chronic heart failure ([place]) [I50.9] Acute on chronic congestive heart failure, unspecified heart failure type ([place]) [I50.9] DISCHARGE DIAGNOSIS:  Active Problems:   Type 2 diabetes mellitus with diabetic neuropathy, without long-term current use of insulin (HCC)   Hypothyroidism   Morbid obesity with BMI of 50.0-59.9, adult (HCC)   OSA (obstructive sleep apnea)   Acute hypoxemic respiratory failure (HCC)   Acute on chronic systolic CHF (congestive heart failure) (HCC)   Effusion of knee joint, left  SECONDARY DIAGNOSIS:   Past Medical History:  Diagnosis Date  . Diabetes mellitus without complication ([place])   . Hypothyroidism   . RLS (restless legs syndrome)   . Thyroid cancer [facility]) 2007   HOSPITAL COURSE:  65 year old female with a known history of diastolic CHF, morbid obesity, hypothyroidism, diabetes type 2, OSA, chronic lymphedema, CKD stage IIIb was admitted for acute hypoxic respiratory failure due to systolic CHF exacerbation.  Acute hypoxic respiratory failure requiring 3 L oxygen on admission.  Now weaned off to room air after diuresis She has underlying sleep apnea and uses CPAP machine at home which can be used at night at the facility if permitted.  She already has her home CPAP settings in place which works for her.  Patient did not qualify for oxygen.  Acute on chronic systolic CHF BNP [id number] on admission Echo showing EF of 45 to 50%. On Coreg at [place] aggressively with Lasix drip while in the hospital.  Transition to oral diuretics on 9/7 Net IO Since Admission: -17,663.79 mL [03/24/21 1624]   Elevated troponin due to demand ischemia No MI  Coronary artery  disease OSA Morbid obesity with a BMI of 50.29  Left knee osteoarthritis with large joint effusion limiting motion S/p aspiration and injection by orthopedics on 9/10 with good relief of symptoms  NSVT Continue Inderal/propranolol for rate control  Acute kidney injury with underlying CKD stage IIIb Kidney function at baseline now slight worsening was due to diuresis She will need close monitoring as an outpatient with nephrology  Type 2 diabetes Continue home regimen    DISCHARGE CONDITIONS:  Stable CONSULTS OBTAINED:  Treatment Team:  Anthonette Legato, MD Corey Skains, MD Hessie Knows, MD DRUG ALLERGIES:  No Known Allergies DISCHARGE MEDICATIONS:   Allergies as of 03/24/2021   No Known Allergies      Medication List     STOP taking these medications    acetaminophen 500 MG tablet Commonly known as: TYLENOL   aspirin 81 MG EC tablet   D3-1000 25 MCG (1000 UT) tablet Generic drug: Cholecalciferol   dextromethorphan-guaiFENesin 30-600 MG 12hr tablet Commonly known as: MUCINEX DM   furosemide 40 MG tablet Commonly known as: LASIX   gabapentin 100 MG capsule Commonly known as: NEURONTIN Replaced by: Gabapentin 50 MG Tabs   gabapentin 300 MG capsule Commonly known as: NEURONTIN   HYDROcodone bit-homatropine 5-1.5 MG/5ML syrup Commonly known as: HYCODAN   methocarbamol 500 MG tablet Commonly known as: ROBAXIN   midodrine 10 MG tablet Commonly known as: PROAMATINE   nystatin powder Commonly known as: MYCOSTATIN/NYSTOP   propranolol 20 MG tablet Commonly known as: INDERAL  TAKE these medications    atorvastatin 20 MG tablet Commonly known as: LIPITOR Take 20 mg by mouth daily at 10 pm.   atorvastatin 20 MG tablet Commonly known as: LIPITOR Take 20 mg by mouth daily.   Biotin 2.5 MG Caps Take 2.5 mg by mouth daily.   buPROPion 150 MG 24 hr tablet Commonly known as: WELLBUTRIN XL Take 150 mg by mouth daily.   carvedilol 6.25  MG tablet Commonly known as: COREG Take 1 tablet (6.25 mg total) by mouth 2 (two) times daily with a meal.   Gabapentin 50 MG Tabs Take 300 mg by mouth 3 (three) times daily. Replaces: gabapentin 100 MG capsule   levothyroxine 125 MCG tablet Commonly known as: SYNTHROID Take 125 mcg by mouth daily.   montelukast 10 MG tablet Commonly known as: SINGULAIR Take 10 mg by mouth daily.   multivitamin tablet Take 1 tablet by mouth daily.   pantoprazole 40 MG tablet Commonly known as: PROTONIX Take 40 mg by mouth daily.   potassium chloride SA 20 MEQ tablet Commonly known as: KLOR-CON Take 2 tablets (40 mEq total) by mouth daily. What changed: how much to take   rOPINIRole 3 MG tablet Commonly known as: REQUIP Take 3 mg by mouth 3 (three) times daily.   Semaglutide (1 MG/DOSE) 2 MG/1.5ML Sopn Inject 0.75 mLs into the skin once a week. What changed: Another medication with the same name was removed. Continue taking this medication, and follow the directions you see here.   Torsemide 40 MG Tabs Take 40 mg by mouth daily.   zinc gluconate 50 MG tablet Take 50 mg by mouth daily.               Discharge Care Instructions  (From admission, onward)           Start     Ordered   03/24/21 0000  Discharge wound care:       Comments: As above   03/24/21 1016           DISCHARGE INSTRUCTIONS:   DIET:  Cardiac diet DISCHARGE CONDITION:  Stable ACTIVITY:  Activity as tolerated OXYGEN:  Home Oxygen: No.  Oxygen Delivery: room air DISCHARGE LOCATION:  nursing home   If you experience worsening of your admission symptoms, develop shortness of breath, life threatening emergency, suicidal or homicidal thoughts you must seek medical attention immediately by calling 911 or calling your MD immediately  if symptoms less severe.  You Must read complete instructions/literature along with all the possible adverse reactions/side effects for all the Medicines you take  and that have been prescribed to you. Take any new Medicines after you have completely understood and accpet all the possible adverse reactions/side effects.   Please note  You were cared for by a hospitalist during your hospital stay. If you have any questions about your discharge medications or the care you received while you were in the hospital after you are discharged, you can call the unit and asked to speak with the hospitalist on call if the hospitalist that took care of you is not available. Once you are discharged, your primary care physician will handle any further medical issues. Please note that NO REFILLS for any discharge medications will be authorized once you are discharged, as it is imperative that you return to your primary care physician (or establish a relationship with a primary care physician if you do not have one) for your aftercare needs so that they can reassess your  need for medications and monitor your lab values.    On the day of Discharge:  VITAL SIGNS:  Blood pressure 124/70, pulse 72, temperature 97.7 F (36.5 C), temperature source Oral, resp. rate 16, height '5\' 4"'$  (1.626 m), weight 132.9 kg, SpO2 92 %. PHYSICAL EXAMINATION:  GENERAL:  65 y.o.-year-old patient lying in the bed with no acute distress.  EYES: Pupils equal, round, reactive to light and accommodation. No scleral icterus. Extraocular muscles intact.  HEENT: Head atraumatic, normocephalic. Oropharynx and nasopharynx clear.  NECK:  Supple, no jugular venous distention. No thyroid enlargement, no tenderness.  LUNGS: Normal breath sounds bilaterally, no wheezing, rales,rhonchi or crepitation. No use of accessory muscles of respiration.  CARDIOVASCULAR: S1, S2 normal. No murmurs, rubs, or gallops.  ABDOMEN: Soft, non-tender, non-distended. Bowel sounds present. No organomegaly or mass.  EXTREMITIES: No pedal edema, cyanosis, or clubbing.  NEUROLOGIC: Cranial nerves II through XII are intact. Muscle  strength 5/5 in all extremities. Sensation intact. Gait not checked.  PSYCHIATRIC: The patient is alert and oriented x 3.  SKIN: No obvious rash, lesion, or ulcer.  DATA REVIEW:   CBC No results for input(s): WBC, HGB, HCT, PLT in the last 168 hours.  Chemistries  Recent Labs  Lab 03/24/21 0408  NA 141  K 3.4*  CL 100  CO2 31  GLUCOSE 119*  BUN 50*  CREATININE 1.22*  CALCIUM 8.4*  MG 2.6*     Outpatient follow-up  Contact information for follow-up providers     Corey Skains, MD. Go on 04/21/2021.   Specialty: Cardiology Why: @ 10:00am Contact information: [address] [facility] [place] [address] [phone]         Rusty Aus, MD. Schedule an appointment as soon as possible for a visit in 3 day(s).   Specialty: Internal Medicine Why: [facility] Discharge F/UP Contact information: [place] [address] [phone]         Hessie Knows, MD. Schedule an appointment as soon as possible for a visit in 2 week(s).   Specialty: Orthopedic Surgery Why: [facility] Discharge F/UP Contact information: [place] [address] [phone]         Lavonia Dana, MD. Schedule an appointment as soon as possible for a visit in 2 week(s).   Specialty: Nephrology Why: [facility] Discharge F/UP Contact information: [place] [address] [phone]              Contact information for after-discharge care     Destination     [facility] Preferred SNF .   Service: Skilled Nursing Contact information: [address] [place] [phone]                     30 Day Unplanned Readmission Risk Score    Flowsheet Row ED to Hosp-Admission (Current) from 03/14/2021 in [facility]  30 Day  Unplanned Readmission Risk Score (%) 20.09 Filed at 03/24/2021 1200       This score is the patient's risk of an unplanned readmission within 30 days of being discharged (0 -100%). The score is based on dignosis, age, lab data, medications, orders, and past utilization.   Low:  0-14.9   Medium: 15-21.9   High: 22-29.9   Extreme: 30 and above  Management plans discussed with the patient, family and they are in agreement.  CODE STATUS: Full Code   TOTAL TIME TAKING CARE OF THIS PATIENT: 45 minutes.    Max Sane M.D on 03/24/2021 at 4:24 PM  [facility]   CC: Primary care physician; Rusty Aus, MD   Note: This dictation was prepared with Dragon dictation along with smaller phrase technology. Any transcriptional errors that result from this process are unintentional.

## 2021-03-25 ENCOUNTER — Ambulatory Visit: Payer: Medicare Other | Admitting: Family

## 2021-03-31 NOTE — Progress Notes (Signed)
Patient ID: Madison Mcbride, female    DOB: 28-Sep-1955, 65 y.o.   MRN: [id number]  HPI  Madison Mcbride is a 65 y/o female with a history of thyroid cancer, DM, lymphedema, RLS, sleep apnea and chronic heart failure.   Echo report from 03/16/21 reviewed and showed an EF of 45-50%.  Admitted 03/14/21 due to HF exacerbation. Initially needed oxygen at 3L and then weaned down to room air after diuresis. Placed on lasix drip and then transitioned to oral diuretics. Orthopaedics, cardiology, nephrology and wound consults obtained. Elevated troponin thought to be due to demand ischemia.  Discharged after 10 days.   Madison Mcbride presents today for her initial visit with a chief complaint of minimal shortness of breath with moderate exertion. Madison Mcbride describes this as chronic in nature having been present for several months. Madison Mcbride has associated fatigue & pedal edema along with this. Madison Mcbride denies any difficulty sleeping, dizziness, cough, chest pain, palpitations or abdominal distention.   Order present already for daily weights at [facility] although patient says that Madison Mcbride's not getting weighed daily although did get weighed today.   Past Medical History:  Diagnosis Date   CHF (congestive heart failure) ([place])    Diabetes mellitus without complication ([place])    Hypothyroidism    Lymphedema    RLS (restless legs syndrome)    Sleep apnea    Thyroid cancer ([place]) 2007   Past Surgical History:  Procedure Laterality Date   ABDOMINAL HYSTERECTOMY     COLONOSCOPY WITH PROPOFOL N/A 08/26/2015   Procedure: COLONOSCOPY WITH PROPOFOL;  Surgeon: Manya Silvas, MD;  Location: [facility];  Service: Endoscopy;  Laterality: N/A;   THYROIDECTOMY     Family History  Problem Relation Age of Onset   Breast cancer Neg Hx    Social History   Tobacco Use   Smoking status: Never   Smokeless tobacco: Never  Substance Use Topics   Alcohol use: No   No Known Allergies Prior to Admission medications   Medication Sig Start Date  End Date Taking? Authorizing Provider  atorvastatin (LIPITOR) 20 MG tablet Take 20 mg by mouth daily. 01/31/20  Yes [provider]  Biotin 2.5 MG CAPS Take 2.5 mg by mouth daily.    Yes [provider]  buPROPion (WELLBUTRIN XL) 150 MG 24 hr tablet Take 150 mg by mouth daily. 01/10/21  Yes [provider]  carvedilol (COREG) 6.25 MG tablet Take 1 tablet (6.25 mg total) by mouth 2 (two) times daily with a meal. 03/24/21  Yes Max Sane, MD  gabapentin 50 MG TABS Take 300 mg by mouth 3 (three) times daily. 03/24/21  Yes Max Sane, MD  levothyroxine (SYNTHROID) 125 MCG tablet Take 125 mcg by mouth daily. 11/11/20 11/12/21 Yes [provider]  montelukast (SINGULAIR) 10 MG tablet Take 10 mg by mouth daily. 02/24/21 02/24/22 Yes [provider]  Multiple Vitamin (MULTIVITAMIN) tablet Take 1 tablet by mouth daily.   Yes [provider]  pantoprazole (PROTONIX) 40 MG tablet Take 40 mg by mouth daily. 01/11/21  Yes [provider]  potassium chloride SA (KLOR-CON) 20 MEQ tablet Take 2 tablets (40 mEq total) by mouth daily. 03/24/21  Yes Max Sane, MD  rOPINIRole (REQUIP) 3 MG tablet Take 3 mg by mouth 3 (three) times daily. 01/20/18  Yes [provider]  Semaglutide, 1 MG/DOSE, 2 MG/1.5ML SOPN Inject 0.75 mLs into the skin once a week. 10/14/20  Yes [provider]  torsemide 40 MG TABS Take  40 mg by mouth daily. 03/24/21  Yes Max Sane, MD  zinc gluconate 50 MG tablet Take 50 mg by mouth daily.   Yes [provider]  atorvastatin (LIPITOR) 20 MG tablet Take 20 mg by mouth daily at 10 pm. 01/31/20 01/30/21  [provider]   Review of Systems  Constitutional:  Positive for fatigue. Negative for appetite change.  HENT:  Negative for congestion, postnasal drip and sore throat.   Eyes: Negative.   Respiratory:  Positive for shortness of breath (very little). Negative for cough and chest tightness.   Cardiovascular:   Positive for leg swelling. Negative for chest pain and palpitations.  Gastrointestinal:  Negative for abdominal distention and abdominal pain.  Endocrine: Negative.   Genitourinary: Negative.   Musculoskeletal:  Negative for back pain and neck pain.  Skin: Negative.   Allergic/Immunologic: Negative.   Neurological:  Negative for dizziness and light-headedness.  Hematological:  Negative for adenopathy. Does not bruise/bleed easily.  Psychiatric/Behavioral:  Negative for dysphoric mood and sleep disturbance (sleeping on 1 pillow and using NIV nightly). The patient is not nervous/anxious.    Vitals:   04/01/21 1335  BP: (!) 160/100  Pulse: 71  Resp: 18  SpO2: 95%  Weight: 289 lb 1 oz (131.1 kg)  Height: '5\' 4"'$  (1.626 m)   Wt Readings from Last 3 Encounters:  04/01/21 289 lb 1 oz (131.1 kg)  03/24/21 292 lb 15.9 oz (132.9 kg)  04/18/20 (!) 319 lb 12.8 oz (145.1 kg)   Lab Results  Component Value Date   CREATININE 1.22 (H) 03/24/2021   CREATININE 1.29 (H) 03/23/2021   CREATININE 1.14 (H) 03/22/2021   Physical Exam Vitals and nursing note reviewed. Exam conducted with a chaperone present (husband).  Constitutional:      Appearance: Normal appearance.  HENT:     Head: Normocephalic and atraumatic.  Cardiovascular:     Rate and Rhythm: Normal rate and regular rhythm.  Pulmonary:     Effort: Pulmonary effort is normal. No respiratory distress.     Breath sounds: No wheezing or rales.  Abdominal:     General: There is no distension.     Palpations: Abdomen is soft.  Musculoskeletal:        General: No tenderness.     Cervical back: Normal range of motion and neck supple.     Right lower leg: No edema.     Left lower leg: No edema.  Skin:    General: Skin is warm and dry.  Neurological:     General: No focal deficit present.     Mental Status: Madison Mcbride is alert and oriented to person, place, and time.  Psychiatric:        Mood and Affect: Mood normal.        Behavior: Behavior  normal.        Thought Content: Thought content normal.    Assessment & Plan:  1: Chronic heart failure with mildly reduced ejection fraction- - NYHA class II - euvolemic today - order written today for daily weight with calling for an overnight weight gain of > 2 pounds or a weekly weight gain of > 5 pounds - not adding salt and low sodium cookbook provided for when Madison Mcbride goes home; keep daily sodium intake to '2000mg'$  / day - due to see cardiology Nehemiah Massed) 04/21/21 - on GDMT of carvedilol - discussed adding entresto but patient is hesitant because Madison Mcbride doesn't feel like Madison Mcbride has heart failure and that Madison Mcbride's already on "  so many medications" - encouraged her to speak with cardiology and PCP about this; will revisit at her next visit - BNP 03/16/21 was 1352.0  2: DM- - saw PCP Sabra Heck) 02/24/21; now seeing PCP at facility - A1c 03/15/21 was 6.3%  3: Sleep apnea- - saw pulmonology Lanney Gins) 01/16/21; returns 04/07/21 - wearing NIV nightly - BMP 03/24/21 reviewed and showed sodium 141, potassium 3.4, creatinine 1.22 and GFR 49   Facility medication list reviewed.   Return in 6 weeks or sooner for any questions/problems before then.

## 2021-04-01 ENCOUNTER — Ambulatory Visit: Payer: Medicare Other | Attending: Family | Admitting: Family

## 2021-04-01 ENCOUNTER — Other Ambulatory Visit: Payer: Self-pay

## 2021-04-01 ENCOUNTER — Encounter: Payer: Self-pay | Admitting: Family

## 2021-04-01 VITALS — BP 160/100 | HR 71 | Resp 18 | Ht 64.0 in | Wt 289.1 lb

## 2021-04-01 DIAGNOSIS — G2581 Restless legs syndrome: Secondary | ICD-10-CM | POA: Diagnosis not present

## 2021-04-01 DIAGNOSIS — Z79899 Other long term (current) drug therapy: Secondary | ICD-10-CM | POA: Insufficient documentation

## 2021-04-01 DIAGNOSIS — I5022 Chronic systolic (congestive) heart failure: Secondary | ICD-10-CM | POA: Diagnosis present

## 2021-04-01 DIAGNOSIS — E1140 Type 2 diabetes mellitus with diabetic neuropathy, unspecified: Secondary | ICD-10-CM

## 2021-04-01 DIAGNOSIS — Z85850 Personal history of malignant neoplasm of thyroid: Secondary | ICD-10-CM | POA: Insufficient documentation

## 2021-04-01 DIAGNOSIS — G4730 Sleep apnea, unspecified: Secondary | ICD-10-CM | POA: Insufficient documentation

## 2021-04-01 DIAGNOSIS — E119 Type 2 diabetes mellitus without complications: Secondary | ICD-10-CM | POA: Insufficient documentation

## 2021-04-01 DIAGNOSIS — Z79890 Hormone replacement therapy (postmenopausal): Secondary | ICD-10-CM | POA: Insufficient documentation

## 2021-04-01 DIAGNOSIS — G4733 Obstructive sleep apnea (adult) (pediatric): Secondary | ICD-10-CM

## 2021-04-01 NOTE — Patient Instructions (Addendum)
Continue weighing daily and call for an overnight weight gain of > 2 pounds or a weekly weight gain of >5 pounds.    Keep sodium intake to 2000mg  daily.

## 2021-04-14 ENCOUNTER — Other Ambulatory Visit: Payer: Self-pay | Admitting: Family

## 2021-04-14 MED ORDER — POTASSIUM CHLORIDE CRYS ER 20 MEQ PO TBCR: 20.0000 meq | EXTENDED_RELEASE_TABLET | Freq: Every day | ORAL | 3 refills | Status: DC

## 2021-04-14 MED ORDER — CARVEDILOL 6.25 MG PO TABS: 6.2500 mg | ORAL_TABLET | Freq: Two times a day (BID) | ORAL | 3 refills | Status: DC

## 2021-04-14 MED ORDER — TORSEMIDE 20 MG PO TABS: 40.0000 mg | ORAL_TABLET | Freq: Every day | ORAL | 3 refills | Status: DC

## 2021-05-12 NOTE — Progress Notes (Deleted)
Patient ID: Madison Mcbride, female    DOB: [birth year], 65 y.o.   MRN: [id number]  HPI  Madison Mcbride is a 65 y/o female with a history of thyroid cancer, DM, lymphedema, RLS, sleep apnea and chronic heart failure.   Echo report from 03/16/21 reviewed and showed an EF of 45-50%.  Admitted 03/14/21 due to HF exacerbation. Initially needed oxygen at 3L and then weaned down to room air after diuresis. Placed on lasix drip and then transitioned to oral diuretics. Orthopaedics, cardiology, nephrology and wound consults obtained. Elevated troponin thought to be due to demand ischemia.  Discharged after 10 days.   She presents today for a follow-up visit with a chief complaint of   Past Medical History:  Diagnosis Date   CHF (congestive heart failure) ([place])    Diabetes mellitus without complication ([place])    Hypothyroidism    Lymphedema    RLS (restless legs syndrome)    Sleep apnea    Thyroid cancer ([place]) 2007   Past Surgical History:  Procedure Laterality Date   ABDOMINAL HYSTERECTOMY     COLONOSCOPY WITH PROPOFOL N/A 08/26/2015   Procedure: COLONOSCOPY WITH PROPOFOL;  Surgeon: Manya Silvas, MD;  Location: [facility];  Service: Endoscopy;  Laterality: N/A;   THYROIDECTOMY     Family History  Problem Relation Age of Onset   Breast cancer Neg Hx    Social History   Tobacco Use   Smoking status: Never   Smokeless tobacco: Never  Substance Use Topics   Alcohol use: No   No Known Allergies  Review of Systems  Constitutional:  Positive for fatigue. Negative for appetite change.  HENT:  Negative for congestion, postnasal drip and sore throat.   Eyes: Negative.   Respiratory:  Positive for shortness of breath (very little). Negative for cough and chest tightness.   Cardiovascular:  Positive for leg swelling. Negative for chest pain and palpitations.  Gastrointestinal:  Negative for abdominal distention and abdominal pain.  Endocrine: Negative.   Genitourinary: Negative.    Musculoskeletal:  Negative for back pain and neck pain.  Skin: Negative.   Allergic/Immunologic: Negative.   Neurological:  Negative for dizziness and light-headedness.  Hematological:  Negative for adenopathy. Does not bruise/bleed easily.  Psychiatric/Behavioral:  Negative for dysphoric mood and sleep disturbance (sleeping on 1 pillow and using NIV nightly). The patient is not nervous/anxious.      Physical Exam Vitals and nursing note reviewed. Exam conducted with a chaperone present (husband).  Constitutional:      Appearance: Normal appearance.  HENT:     Head: Normocephalic and atraumatic.  Cardiovascular:     Rate and Rhythm: Normal rate and regular rhythm.  Pulmonary:     Effort: Pulmonary effort is normal. No respiratory distress.     Breath sounds: No wheezing or rales.  Abdominal:     General: There is no distension.     Palpations: Abdomen is soft.  Musculoskeletal:        General: No tenderness.     Cervical back: Normal range of motion and neck supple.     Right lower leg: No edema.     Left lower leg: No edema.  Skin:    General: Skin is warm and dry.  Neurological:     General: No focal deficit present.     Mental Status: She is alert and oriented to person, place, and time.  Psychiatric:        Mood and Affect: Mood normal.  Behavior: Behavior normal.        Thought Content: Thought content normal.    Assessment & Plan:  1: Chronic heart failure with mildly reduced ejection fraction- - NYHA class II - euvolemic today - order written today for daily weight with calling for an overnight weight gain of > 2 pounds or a weekly weight gain of > 5 pounds - not adding salt and low sodium cookbook provided for when she goes home; keep daily sodium intake to 2000mg  / day - saw cardiology Nehemiah Massed) 04/21/21 - on GDMT of carvedilol - discussed adding entresto but patient is hesitant because she doesn't feel like she has heart failure and that she's already  on "so many medications" - encouraged her to speak with cardiology and PCP about this; will revisit at her next visit - BNP 03/16/21 was 1352.0 - saw nephrology Holley Raring) 04/03/21  2: DM- - saw PCP Sabra Heck) 02/24/21; now seeing PCP at facility - A1c 03/15/21 was 6.3%  3: Sleep apnea- - saw pulmonology Lanney Gins) 05/12/21 - wearing NIV nightly - BMP 03/24/21 reviewed and showed sodium 141, potassium 3.4, creatinine 1.22 and GFR 49   Facility medication list reviewed.

## 2021-05-13 ENCOUNTER — Ambulatory Visit: Payer: Medicare Other | Admitting: Family

## 2021-05-23 ENCOUNTER — Ambulatory Visit: Payer: Medicare Other | Admitting: Family

## 2021-06-03 ENCOUNTER — Ambulatory Visit: Payer: Medicare Other | Admitting: Family

## 2021-06-19 ENCOUNTER — Ambulatory Visit: Payer: Medicare Other | Admitting: Family

## 2021-07-15 ENCOUNTER — Emergency Department: Payer: Medicare Other

## 2021-07-15 ENCOUNTER — Inpatient Hospital Stay
Admission: EM | Admit: 2021-07-15 | Discharge: 2021-07-29 | DRG: 291 | Disposition: A | Discharge status: Skilled Nursing Facility | Payer: Medicare Other | Attending: Internal Medicine | Admitting: Internal Medicine

## 2021-07-15 ENCOUNTER — Encounter: Payer: Self-pay | Admitting: *Deleted

## 2021-07-15 ENCOUNTER — Other Ambulatory Visit: Payer: Self-pay

## 2021-07-15 DIAGNOSIS — N179 Acute kidney failure, unspecified: Secondary | ICD-10-CM | POA: Diagnosis present

## 2021-07-15 DIAGNOSIS — S63286A Dislocation of proximal interphalangeal joint of right little finger, initial encounter: Secondary | ICD-10-CM | POA: Diagnosis present

## 2021-07-15 DIAGNOSIS — I130 Hypertensive heart and chronic kidney disease with heart failure and stage 1 through stage 4 chronic kidney disease, or unspecified chronic kidney disease: Principal | ICD-10-CM | POA: Diagnosis present

## 2021-07-15 DIAGNOSIS — I2699 Other pulmonary embolism without acute cor pulmonale: Secondary | ICD-10-CM

## 2021-07-15 DIAGNOSIS — J9621 Acute and chronic respiratory failure with hypoxia: Secondary | ICD-10-CM | POA: Diagnosis present

## 2021-07-15 DIAGNOSIS — E662 Morbid (severe) obesity with alveolar hypoventilation: Secondary | ICD-10-CM | POA: Diagnosis present

## 2021-07-15 DIAGNOSIS — E039 Hypothyroidism, unspecified: Secondary | ICD-10-CM | POA: Diagnosis present

## 2021-07-15 DIAGNOSIS — E875 Hyperkalemia: Secondary | ICD-10-CM | POA: Diagnosis present

## 2021-07-15 DIAGNOSIS — M19041 Primary osteoarthritis, right hand: Secondary | ICD-10-CM | POA: Diagnosis present

## 2021-07-15 DIAGNOSIS — G2581 Restless legs syndrome: Secondary | ICD-10-CM | POA: Diagnosis present

## 2021-07-15 DIAGNOSIS — J9811 Atelectasis: Secondary | ICD-10-CM

## 2021-07-15 DIAGNOSIS — Z79899 Other long term (current) drug therapy: Secondary | ICD-10-CM

## 2021-07-15 DIAGNOSIS — R0902 Hypoxemia: Secondary | ICD-10-CM

## 2021-07-15 DIAGNOSIS — G4733 Obstructive sleep apnea (adult) (pediatric): Secondary | ICD-10-CM | POA: Diagnosis present

## 2021-07-15 DIAGNOSIS — E1122 Type 2 diabetes mellitus with diabetic chronic kidney disease: Secondary | ICD-10-CM | POA: Diagnosis present

## 2021-07-15 DIAGNOSIS — Z85850 Personal history of malignant neoplasm of thyroid: Secondary | ICD-10-CM

## 2021-07-15 DIAGNOSIS — J9601 Acute respiratory failure with hypoxia: Secondary | ICD-10-CM | POA: Diagnosis not present

## 2021-07-15 DIAGNOSIS — I509 Heart failure, unspecified: Secondary | ICD-10-CM

## 2021-07-15 DIAGNOSIS — R0602 Shortness of breath: Secondary | ICD-10-CM

## 2021-07-15 DIAGNOSIS — J9602 Acute respiratory failure with hypercapnia: Secondary | ICD-10-CM | POA: Diagnosis present

## 2021-07-15 DIAGNOSIS — Z6841 Body Mass Index (BMI) 40.0 and over, adult: Secondary | ICD-10-CM

## 2021-07-15 DIAGNOSIS — F32A Depression, unspecified: Secondary | ICD-10-CM | POA: Diagnosis present

## 2021-07-15 DIAGNOSIS — K5900 Constipation, unspecified: Secondary | ICD-10-CM | POA: Diagnosis not present

## 2021-07-15 DIAGNOSIS — I872 Venous insufficiency (chronic) (peripheral): Secondary | ICD-10-CM | POA: Diagnosis present

## 2021-07-15 DIAGNOSIS — Z91199 Patient's noncompliance with other medical treatment and regimen due to unspecified reason: Secondary | ICD-10-CM

## 2021-07-15 DIAGNOSIS — K219 Gastro-esophageal reflux disease without esophagitis: Secondary | ICD-10-CM | POA: Diagnosis present

## 2021-07-15 DIAGNOSIS — I5023 Acute on chronic systolic (congestive) heart failure: Secondary | ICD-10-CM

## 2021-07-15 DIAGNOSIS — E876 Hypokalemia: Secondary | ICD-10-CM | POA: Diagnosis present

## 2021-07-15 DIAGNOSIS — Z9114 Patient's other noncompliance with medication regimen: Secondary | ICD-10-CM

## 2021-07-15 DIAGNOSIS — M1990 Unspecified osteoarthritis, unspecified site: Secondary | ICD-10-CM

## 2021-07-15 DIAGNOSIS — Z90710 Acquired absence of both cervix and uterus: Secondary | ICD-10-CM

## 2021-07-15 DIAGNOSIS — Z20822 Contact with and (suspected) exposure to covid-19: Secondary | ICD-10-CM | POA: Diagnosis present

## 2021-07-15 DIAGNOSIS — G9341 Metabolic encephalopathy: Secondary | ICD-10-CM | POA: Diagnosis not present

## 2021-07-15 DIAGNOSIS — R0600 Dyspnea, unspecified: Secondary | ICD-10-CM

## 2021-07-15 DIAGNOSIS — F419 Anxiety disorder, unspecified: Secondary | ICD-10-CM | POA: Diagnosis present

## 2021-07-15 DIAGNOSIS — N1831 Chronic kidney disease, stage 3a: Secondary | ICD-10-CM | POA: Diagnosis present

## 2021-07-15 DIAGNOSIS — E890 Postprocedural hypothyroidism: Secondary | ICD-10-CM | POA: Diagnosis present

## 2021-07-15 DIAGNOSIS — E1140 Type 2 diabetes mellitus with diabetic neuropathy, unspecified: Secondary | ICD-10-CM | POA: Diagnosis present

## 2021-07-15 DIAGNOSIS — I9589 Other hypotension: Secondary | ICD-10-CM | POA: Diagnosis present

## 2021-07-15 DIAGNOSIS — I10 Essential (primary) hypertension: Secondary | ICD-10-CM | POA: Diagnosis present

## 2021-07-15 DIAGNOSIS — Z23 Encounter for immunization: Secondary | ICD-10-CM

## 2021-07-15 DIAGNOSIS — Z79890 Hormone replacement therapy (postmenopausal): Secondary | ICD-10-CM

## 2021-07-15 DIAGNOSIS — M25441 Effusion, right hand: Secondary | ICD-10-CM

## 2021-07-15 DIAGNOSIS — G4710 Hypersomnia, unspecified: Secondary | ICD-10-CM | POA: Diagnosis present

## 2021-07-15 DIAGNOSIS — J449 Chronic obstructive pulmonary disease, unspecified: Secondary | ICD-10-CM | POA: Diagnosis present

## 2021-07-15 DIAGNOSIS — E785 Hyperlipidemia, unspecified: Secondary | ICD-10-CM | POA: Diagnosis present

## 2021-07-15 DIAGNOSIS — I480 Paroxysmal atrial fibrillation: Secondary | ICD-10-CM | POA: Diagnosis present

## 2021-07-15 DIAGNOSIS — I5033 Acute on chronic diastolic (congestive) heart failure: Secondary | ICD-10-CM | POA: Diagnosis present

## 2021-07-15 DIAGNOSIS — J9622 Acute and chronic respiratory failure with hypercapnia: Secondary | ICD-10-CM | POA: Diagnosis present

## 2021-07-15 DIAGNOSIS — S63259A Unspecified dislocation of unspecified finger, initial encounter: Secondary | ICD-10-CM

## 2021-07-15 LAB — CBC
HCT: 42.6 % (ref 36.0–46.0)
Hemoglobin: 12.2 g/dL (ref 12.0–15.0)
MCH: 25.4 pg — ABNORMAL LOW (ref 26.0–34.0)
MCHC: 28.6 g/dL — ABNORMAL LOW (ref 30.0–36.0)
MCV: 88.6 fL (ref 80.0–100.0)
Platelets: 426 10*3/uL — ABNORMAL HIGH (ref 150–400)
RBC: 4.81 MIL/uL (ref 3.87–5.11)
RDW: 18.2 % — ABNORMAL HIGH (ref 11.5–15.5)
WBC: 10.5 10*3/uL (ref 4.0–10.5)
nRBC: 0.2 % (ref 0.0–0.2)

## 2021-07-15 LAB — BASIC METABOLIC PANEL
Anion gap: 10 (ref 5–15)
BUN: 38 mg/dL — ABNORMAL HIGH (ref 8–23)
CO2: 29 mmol/L (ref 22–32)
Calcium: 8.3 mg/dL — ABNORMAL LOW (ref 8.9–10.3)
Chloride: 98 mmol/L (ref 98–111)
Creatinine, Ser: 1.89 mg/dL — ABNORMAL HIGH (ref 0.44–1.00)
GFR, Estimated: 29 mL/min — ABNORMAL LOW (ref >60)
Glucose, Bld: 134 mg/dL — ABNORMAL HIGH (ref 70–99)
Potassium: 6 mmol/L — ABNORMAL HIGH (ref 3.5–5.1)
Sodium: 137 mmol/L (ref 135–145)

## 2021-07-15 LAB — TROPONIN I (HIGH SENSITIVITY): Troponin I (High Sensitivity): 16 ng/L (ref <18)

## 2021-07-15 LAB — BRAIN NATRIURETIC PEPTIDE: B Natriuretic Peptide: 1583.1 pg/mL — ABNORMAL HIGH (ref 0.0–100.0)

## 2021-07-15 LAB — RESP PANEL BY RT-PCR (FLU A&B, COVID) ARPGX2
Influenza A by PCR: NEGATIVE
Influenza B by PCR: NEGATIVE
SARS Coronavirus 2 by RT PCR: NEGATIVE

## 2021-07-15 MED ORDER — CARVEDILOL 6.25 MG PO TABS
6.2500 mg | ORAL_TABLET | Freq: Two times a day (BID) | ORAL | Status: DC
  Administered 2021-07-17: 6.25 mg via ORAL
  Filled 2021-07-15 (×2): qty 1

## 2021-07-15 MED ORDER — ONDANSETRON HCL 4 MG/2ML IJ SOLN
4.0000 mg | Freq: Four times a day (QID) | INTRAMUSCULAR | Status: DC | PRN
  Administered 2021-07-16: 4 mg via INTRAVENOUS
  Filled 2021-07-15: qty 2

## 2021-07-15 MED ORDER — ACETAMINOPHEN 325 MG PO TABS
650.0000 mg | ORAL_TABLET | ORAL | Status: DC | PRN
  Administered 2021-07-17 – 2021-07-24 (×10): 650 mg via ORAL
  Filled 2021-07-15 (×10): qty 2

## 2021-07-15 MED ORDER — SODIUM CHLORIDE 0.9 % IV SOLN: 250.0000 mL | INTRAVENOUS | Status: DC | PRN

## 2021-07-15 MED ORDER — SODIUM CHLORIDE 0.9% FLUSH
3.0000 mL | Freq: Two times a day (BID) | INTRAVENOUS | Status: DC
  Administered 2021-07-16 – 2021-07-29 (×28): 3 mL via INTRAVENOUS

## 2021-07-15 MED ORDER — BUPROPION HCL ER (XL) 150 MG PO TB24
150.0000 mg | ORAL_TABLET | Freq: Every day | ORAL | Status: DC
  Administered 2021-07-17 – 2021-07-29 (×13): 150 mg via ORAL
  Filled 2021-07-15 (×14): qty 1

## 2021-07-15 MED ORDER — HEPARIN SODIUM (PORCINE) 5000 UNIT/ML IJ SOLN
5000.0000 [IU] | Freq: Three times a day (TID) | INTRAMUSCULAR | Status: DC
  Administered 2021-07-16 – 2021-07-22 (×18): 5000 [IU] via SUBCUTANEOUS
  Filled 2021-07-15 (×18): qty 1

## 2021-07-15 MED ORDER — GABAPENTIN 50 MG PO TABS: 300.0000 mg | ORAL_TABLET | Freq: Three times a day (TID) | ORAL | Status: DC

## 2021-07-15 MED ORDER — MONTELUKAST SODIUM 10 MG PO TABS
10.0000 mg | ORAL_TABLET | Freq: Every day | ORAL | Status: DC
  Administered 2021-07-17 – 2021-07-28 (×12): 10 mg via ORAL
  Filled 2021-07-15 (×13): qty 1

## 2021-07-15 MED ORDER — FUROSEMIDE 10 MG/ML IJ SOLN
60.0000 mg | Freq: Once | INTRAMUSCULAR | Status: AC
  Administered 2021-07-16: 60 mg via INTRAVENOUS
  Filled 2021-07-15: qty 8

## 2021-07-15 MED ORDER — PANTOPRAZOLE SODIUM 40 MG PO TBEC
40.0000 mg | DELAYED_RELEASE_TABLET | Freq: Every day | ORAL | Status: DC
  Administered 2021-07-17 – 2021-07-29 (×13): 40 mg via ORAL
  Filled 2021-07-15 (×14): qty 1

## 2021-07-15 MED ORDER — SODIUM CHLORIDE 0.9% FLUSH
3.0000 mL | INTRAVENOUS | Status: DC | PRN
  Administered 2021-07-17: 3 mL via INTRAVENOUS

## 2021-07-15 MED ORDER — FUROSEMIDE 10 MG/ML IJ SOLN
40.0000 mg | Freq: Every day | INTRAMUSCULAR | Status: AC
  Administered 2021-07-16: 40 mg via INTRAVENOUS
  Filled 2021-07-15: qty 4

## 2021-07-15 MED ORDER — ROPINIROLE HCL 1 MG PO TABS
3.0000 mg | ORAL_TABLET | Freq: Three times a day (TID) | ORAL | Status: DC
  Administered 2021-07-17 – 2021-07-29 (×36): 3 mg via ORAL
  Filled 2021-07-15 (×44): qty 3

## 2021-07-15 MED ORDER — ATORVASTATIN CALCIUM 20 MG PO TABS
20.0000 mg | ORAL_TABLET | Freq: Every day | ORAL | Status: DC
  Administered 2021-07-17 – 2021-07-18 (×2): 20 mg via ORAL
  Filled 2021-07-15 (×2): qty 1

## 2021-07-15 MED ORDER — LEVOTHYROXINE SODIUM 25 MCG PO TABS
125.0000 ug | ORAL_TABLET | Freq: Every day | ORAL | Status: DC
  Administered 2021-07-17 – 2021-07-29 (×13): 125 ug via ORAL
  Filled 2021-07-15: qty 1
  Filled 2021-07-15: qty 3
  Filled 2021-07-15 (×2): qty 1
  Filled 2021-07-15: qty 3
  Filled 2021-07-15 (×9): qty 1

## 2021-07-15 NOTE — ED Notes (Signed)
Pt placed on 2 liters oxygen n/c and sent to xray.

## 2021-07-15 NOTE — ED Triage Notes (Signed)
Pt has sob for 4 days.  Hx chf.  No chest pain.  Nonsmoker.  Pt reports a cough.  No fever.  Pt alert

## 2021-07-15 NOTE — ED Notes (Signed)
Dr. Tobie Poet at bedside assessing patient.

## 2021-07-15 NOTE — ED Provider Notes (Signed)
[facility] Provider Note    Event Date/Time   First MD Initiated Contact with Patient 07/15/21 2213     (approximate)   History   Shortness of Breath   HPI  Madison Mcbride is a 66 y.o. female  who, according to nephrology not dated 9.22.22 has history of DM with ckd, CHF, presents to the emegency department today because of concern for shortness of breath. She states she has not been compliant with her diuretic medication and has missed doses recently. Shortness of breath started a few days ago and has progressively gotten worse.  She has not noticed any chest pain with this.  Has had some associated cough.  Physical Exam   Triage Vital Signs: ED Triage Vitals  Enc Vitals Group     BP 07/15/21 2145 103/71     Pulse Rate 07/15/21 2145 (!) 49     Resp 07/15/21 2145 20     Temp 07/15/21 2145 98.1 F (36.7 C)     Temp Source 07/15/21 2145 Oral     SpO2 07/15/21 2145 (!) 85 %     Weight 07/15/21 2142 (!) 305 lb (138.3 kg)     Height 07/15/21 2142 5\' 5"  (1.651 m)     Head Circumference --      Peak Flow --      Pain Score 07/15/21 2142 8     Pain Loc --      Pain Edu? --      Excl. in [place]? --     Most recent vital signs: Vitals:   07/15/21 2145  BP: 103/71  Pulse: (!) 49  Resp: 20  Temp: 98.1 F (36.7 C)  SpO2: (!) 85%    General: Awake, no distress.  CV:  Good peripheral perfusion.  Resp:  Increased respiratory effort. Some crackles in bases. Abd:  No distention.  MSK:  Bilateral lower extremity edema.    ED Results / Procedures / Treatments   Labs (all labs ordered are listed, but only abnormal results are displayed) Labs Reviewed  BASIC METABOLIC PANEL - Abnormal; Notable for the following components:      Result Value   Potassium 6.0 (*)    Glucose, Bld 134 (*)    BUN 38 (*)    Creatinine, Ser 1.89 (*)    Calcium 8.3 (*)    GFR, Estimated 29 (*)    All other components within normal limits  CBC - Abnormal; Notable  for the following components:   MCH 25.4 (*)    MCHC 28.6 (*)    RDW 18.2 (*)    Platelets 426 (*)    All other components within normal limits  BRAIN NATRIURETIC PEPTIDE - Abnormal; Notable for the following components:   B Natriuretic Peptide 1,583.1 (*)    All other components within normal limits  RESP PANEL BY RT-PCR (FLU A&B, COVID) ARPGX2  TROPONIN I (HIGH SENSITIVITY)  TROPONIN I (HIGH SENSITIVITY)     EKG  I, Nance Pear, attending physician, personally viewed and interpreted this EKG  EKG Time: 2152 Rate: 54 Rhythm: sinus bradycardia with sinus arrythmia Axis: right superior axis deviation Intervals: qtc 504 QRS: low voltage ST changes: no st elevation Impression: abnormal ekg  RADIOLOGY CXR My interpretation: Low lung volumes, cardiomegaly Radiology interpretation:  IMPRESSION:  Stable cardiomegaly. Morphologic changes in keeping with pulmonary  arterial hypertension.   PROCEDURES:  Critical Care performed: No  Procedures   MEDICATIONS ORDERED IN ED: Medications  furosemide (  LASIX) injection 60 mg (has no administration in time range)     IMPRESSION / MDM / ASSESSMENT AND PLAN / ED COURSE  I reviewed the triage vital signs and the nursing notes.                              Differential diagnosis includes, but is not limited to, pneumonia, pneumothorax, PE, CHF, influenza, COVID.    Patient presented to the emergency department today because of concerns for shortness of breath.  Per chart review patient has a history of CHF.  She states she has not been compliant with her medications.  Was found to be hypoxic on room air so was placed on nasal cannula which did significantly help her oxygen saturation.  Blood work here does show elevation of the BNP.  Additionally some acute kidney injury.  Do think at this time likely some fluid overload.  Patient was ordered Lasix.  Will have discussion with hospitalist about admission.  Discussed plan with  patient.     FINAL CLINICAL IMPRESSION(S) / ED DIAGNOSES   Final diagnoses:  Congestive heart failure, unspecified HF chronicity, unspecified heart failure type ([place])  Hypoxia  SOB (shortness of breath)     Note:  This document was prepared using Dragon voice recognition software and may include unintentional dictation errors.    Nance Pear, MD 07/15/21 [phone]

## 2021-07-15 NOTE — H&P (Signed)
History and Physical   Madison Mcbride AOZ:[id number] DOB: Jul 02, 1956 DOA: 07/15/2021  PCP: Rusty Aus, MD  Outpatient Specialists: Dr. Stevphen [facility] cardiology Patient coming from: Home  I have personally briefly reviewed patient's old medical records in [place].  Chief Concern: Shortness of breath  HPI: Madison Mcbride is a 66 y.o. female with medical history significant for morbid obesity, OSA on CPAP, heart failure, depression, anxiety, hyperlipidemia, neuropathy, hypothyroid, non-insulin-dependent diabetes mellitus, who presents emergency department for chief concerns of shortness of breath.  She states that she forgot to take her fluid medication for the last 2 days.  She states that she does not always wear her BiPAP machine at night.  She endorses shortness of breath and bilateral lower extremity swelling.  She denies any chest pain, abdominal pain, dysuria, hematuria, diarrhea, vomiting.  She does endorse some cough and some nausea.  Patient also endorses that she does not restrict her fluid intake daily.  She reports the shortness of breath is worse with exertion.  At baseline patient does not wear nasal cannula.  Social history: She lives at home with her husband.  She denies any tobacco, EtOH, recreational drug use.  ROS: Constitutional: no weight change, no fever ENT/Mouth: no sore throat, no rhinorrhea Eyes: no eye pain, no vision changes Cardiovascular: no chest pain, + dyspnea,  no edema, no palpitations Respiratory: + cough, no sputum, no wheezing Gastrointestinal: + nausea, no vomiting, no diarrhea, no constipation Genitourinary: no urinary incontinence, no dysuria, no hematuria Musculoskeletal: no arthralgias, no myalgias Skin: no skin lesions, no pruritus, Neuro: + weakness, no loss of consciousness, no syncope Psych: no anxiety, no depression, + decrease appetite Heme/Lymph: no bruising, no bleeding  ED Course: Discussed with  emergency medicine provider, patient requiring hospitalization for chief concerns of acute heart failure exacerbation.  Vitals in the emergency department showed temperature of 98.1, respiration rate of 20, heart rate of 49 and increased to 60 and decreased to 46, blood pressure 103/71, improved to 117/61, SPO2 initially was 85% on room air and improved to 97% on 2 L nasal cannula.  Labs in the emergency department showed serum sodium 137, potassium 6.0, chloride 98, bicarb 29, BUN 38, serum creatinine of 1.89, nonfasting blood glucose 134, GFR of 29.  WBC was elevated at 10.5, hemoglobin 12.2, platelets of 426.  BNP is 1583.1.  COVID/influenza A/influenza B PCR were negative.  In the emergency department patient received furosemide 60 mg IV once per EDP.  Assessment/Plan  Principal Problem:   Acute exacerbation of CHF (congestive heart failure) (HCC) Active Problems:   Hypothyroidism   Morbid obesity with BMI of 50.0-59.9, adult (HCC)   Acute respiratory failure with hypoxia and hypercapnia (HCC)   HLD (hyperlipidemia)   Depression   OSA (obstructive sleep apnea)   Acute hypoxemic respiratory failure (HCC)   Benign essential hypertension   Stage 3a chronic kidney disease ([place])   # Shortness of breath presumed secondary to heart failure exacerbation # Acute hypoxic respiratory failure secondary to heart failure exacerbation - Query medication noncompliance - Strict I's and O's - Complete echo was last done on 03/16/2021 which was read as ejection fraction 45 to 50% - Did not order a complete echo as it is within 92-month window - Ordered additional dose of furosemide 40 mg IV starting on 07/16/2020, 1 dose ordered - Admit to telemetry medical, observation -BiPAP nightly ordered - Counseled patient on fluid medication compliance and restriction of total fluid intake to  less than 45 ounces of fluid per day  # Acute kidney injury on CKD 3 A-query secondary to cardiorenal - Treat as  above  # OSA-BiPAP nightly at home # Hyperlipidemia-atorvastatin 20 mg nightly resumed # Hypothyroid-levothyroxine 125 mcg daily resumed # Depression/anxiety-resumed bupropion 150 mg p.o. daily # Sinus bradycardia-appears chronic # Restless leg syndrome-resumed home ropinirole 3 mg p.o. daily # Neuropathy-resumed gabapentin 300 mg p.o. 3 times daily # GERD-PPI resumed # Morbid obesity  Pending med reconciliation  Chart reviewed.   DVT prophylaxis: Heparin 5000 units every 8 hours Code Status: Full code Diet: Heart healthy Family Communication: No Disposition Plan: Pending clinical course Consults called: None at this time Admission status: Telemetry medical, observation  Past Medical History:  Diagnosis Date   CHF (congestive heart failure) ([place])    Diabetes mellitus without complication ([place])    Hypothyroidism    Lymphedema    RLS (restless legs syndrome)    Sleep apnea    Thyroid cancer ([place]) 2007   Past Surgical History:  Procedure Laterality Date   ABDOMINAL HYSTERECTOMY     COLONOSCOPY WITH PROPOFOL N/A 08/26/2015   Procedure: COLONOSCOPY WITH PROPOFOL;  Surgeon: Manya Silvas, MD;  Location: [facility] [place];  Service: Endoscopy;  Laterality: N/A;   THYROIDECTOMY     Social History:  reports that she has never smoked. She has never used smokeless tobacco. She reports that she does not drink alcohol and does not use drugs.  No Known Allergies Family History  Problem Relation Age of Onset   Breast cancer Neg Hx    Family history: Family history reviewed and not pertinent.  Prior to Admission medications   Medication Sig Start Date End Date Taking? Authorizing Provider  atorvastatin (LIPITOR) 20 MG tablet Take 20 mg by mouth daily at 10 pm. 01/31/20 01/30/21  [provider]  atorvastatin (LIPITOR) 20 MG tablet Take 20 mg by mouth daily. 01/31/20   [provider]  Biotin 2.5 MG CAPS Take 2.5 mg by mouth daily.     [provider]   buPROPion (WELLBUTRIN XL) 150 MG 24 hr tablet Take 150 mg by mouth daily. 01/10/21   [provider]  carvedilol (COREG) 6.25 MG tablet Take 1 tablet (6.25 mg total) by mouth 2 (two) times daily with a meal. 04/14/21   Alisa Graff, FNP  gabapentin 50 MG TABS Take 300 mg by mouth 3 (three) times daily. 03/24/21   Max Sane, MD  levothyroxine (SYNTHROID) 125 MCG tablet Take 125 mcg by mouth daily. 11/11/20 11/12/21  [provider]  montelukast (SINGULAIR) 10 MG tablet Take 10 mg by mouth daily. 02/24/21 02/24/22  [provider]  Multiple Vitamin (MULTIVITAMIN) tablet Take 1 tablet by mouth daily.    [provider]  pantoprazole (PROTONIX) 40 MG tablet Take 40 mg by mouth daily. 01/11/21   [provider]  potassium chloride SA (KLOR-CON) 20 MEQ tablet Take 1 tablet (20 mEq total) by mouth daily. 04/14/21   Alisa Graff, FNP  rOPINIRole (REQUIP) 3 MG tablet Take 3 mg by mouth 3 (three) times daily. 01/20/18   [provider]  Semaglutide, 1 MG/DOSE, 2 MG/1.5ML SOPN Inject 0.75 mLs into the skin once a week. 10/14/20   [provider]  torsemide (DEMADEX) 20 MG tablet Take 2 tablets (40 mg total) by mouth daily. 04/14/21   Alisa Graff, FNP  zinc gluconate 50 MG tablet Take 50 mg by mouth daily.    [provider]  Physical Exam: Vitals:   07/15/21 2142 07/15/21 2145 07/15/21 2221 07/15/21 2222  BP:  103/71 117/61   Pulse:  (!) 49 (!) 57 60  Resp:  20 18   Temp:  98.1 F (36.7 C)    TempSrc:  Oral    SpO2:  (!) 85% 97% 97%  Weight: (!) 138.3 kg     Height: 5\' 5"  (1.651 m)      Constitutional: appears age-appropriate, NAD, calm, comfortable Eyes: PERRL, lids and conjunctivae normal ENMT: Mucous membranes are moist. Posterior pharynx clear of any exudate or lesions. Age-appropriate dentition. Hearing appropriate. Neck: normal, supple, no masses, no thyromegaly Respiratory: clear to auscultation bilaterally, no wheezing,  no crackles. Normal respiratory effort. No accessory muscle use.  Nasal cannula in place. Cardiovascular: Regular rate and rhythm, no murmurs / rubs / gallops.  Bilateral lower extremity edema. 2+ pedal pulses. No carotid bruits.  Abdomen: Morbidly obese abdomen, no tenderness, no masses palpated, no hepatosplenomegaly. Bowel sounds positive.  Musculoskeletal: no clubbing / cyanosis. No joint deformity upper and lower extremities. Good ROM, no contractures, no atrophy. Normal muscle tone.  Skin: no rashes, lesions, ulcers. No induration Neurologic: Sensation intact. Strength 5/5 in all 4.  Psychiatric: Normal judgment and insight. Alert and oriented x 3. Normal mood.   EKG: independently reviewed, showing sinus bradycardia with rate of 54, QTc 504  Chest x-ray on Admission: I personally reviewed and I agree with radiologist reading as below.  DG Chest 2 View  Result Date: 07/15/2021 CLINICAL DATA:  Dyspnea EXAM: CHEST - 2 VIEW COMPARISON:  03/14/2021 FINDINGS: Bibasilar opacification likely relates to overlying soft tissue. Lungs are clear. No pneumothorax or pleural effusion. Mild cardiomegaly is stable. Central pulmonary arterial enlargement is again noted in keeping with changes of pulmonary arterial hypertension. IMPRESSION: Stable cardiomegaly. Morphologic changes in keeping with pulmonary arterial hypertension. Electronically Signed   By: Fidela Salisbury M.D.   On: 07/15/2021 22:15    Labs on Admission: I have personally reviewed following labs  CBC: Recent Labs  Lab 07/15/21 2147  WBC 10.5  HGB 12.2  HCT 42.6  MCV 88.6  PLT 741*   Basic Metabolic Panel: Recent Labs  Lab 07/15/21 2147  NA 137  K 6.0*  CL 98  CO2 29  GLUCOSE 134*  BUN 38*  CREATININE 1.89*  CALCIUM 8.3*   GFR: Estimated Creatinine Clearance: 40.8 mL/min (A) (by C-G formula based on SCr of 1.89 mg/dL (H)).  Urine analysis:    Component Value Date/Time   COLORURINE YELLOW 03/22/2021 1320    APPEARANCEUR CLOUDY (A) 03/22/2021 1320   APPEARANCEUR CLOUDY 01/15/2014 0209   LABSPEC 1.020 03/22/2021 1320   LABSPEC 1.014 01/15/2014 0209   PHURINE 8.5 (H) 03/22/2021 1320   GLUCOSEU 100 (A) 03/22/2021 1320   GLUCOSEU NEGATIVE 01/15/2014 0209   HGBUR MODERATE (A) 03/22/2021 1320   BILIRUBINUR NEGATIVE 03/22/2021 1320   BILIRUBINUR NEGATIVE 01/15/2014 0209   KETONESUR NEGATIVE 03/22/2021 1320   PROTEINUR >300 (A) 03/22/2021 1320   NITRITE NEGATIVE 03/22/2021 1320   LEUKOCYTESUR SMALL (A) 03/22/2021 1320   LEUKOCYTESUR 2+ 01/15/2014 0209   Dr. Tobie Poet [facility]  If 7PM-7AM, please contact overnight-coverage provider If 7AM-7PM, please contact day coverage provider [email or web address]  07/15/2021, 11:34 PM

## 2021-07-16 DIAGNOSIS — F32A Depression, unspecified: Secondary | ICD-10-CM | POA: Diagnosis not present

## 2021-07-16 DIAGNOSIS — I10 Essential (primary) hypertension: Secondary | ICD-10-CM | POA: Diagnosis not present

## 2021-07-16 DIAGNOSIS — I509 Heart failure, unspecified: Secondary | ICD-10-CM | POA: Diagnosis not present

## 2021-07-16 DIAGNOSIS — J9601 Acute respiratory failure with hypoxia: Secondary | ICD-10-CM | POA: Diagnosis not present

## 2021-07-16 LAB — BASIC METABOLIC PANEL
Anion gap: 11 (ref 5–15)
BUN: 39 mg/dL — ABNORMAL HIGH (ref 8–23)
CO2: 27 mmol/L (ref 22–32)
Calcium: 8 mg/dL — ABNORMAL LOW (ref 8.9–10.3)
Chloride: 99 mmol/L (ref 98–111)
Creatinine, Ser: 1.76 mg/dL — ABNORMAL HIGH (ref 0.44–1.00)
GFR, Estimated: 31 mL/min — ABNORMAL LOW (ref >60)
Glucose, Bld: 95 mg/dL (ref 70–99)
Potassium: 4.9 mmol/L (ref 3.5–5.1)
Sodium: 137 mmol/L (ref 135–145)

## 2021-07-16 LAB — CBC
HCT: 41.9 % (ref 36.0–46.0)
Hemoglobin: 11.9 g/dL — ABNORMAL LOW (ref 12.0–15.0)
MCH: 25.4 pg — ABNORMAL LOW (ref 26.0–34.0)
MCHC: 28.4 g/dL — ABNORMAL LOW (ref 30.0–36.0)
MCV: 89.3 fL (ref 80.0–100.0)
Platelets: 378 10*3/uL (ref 150–400)
RBC: 4.69 MIL/uL (ref 3.87–5.11)
RDW: 18.2 % — ABNORMAL HIGH (ref 11.5–15.5)
WBC: 10.1 10*3/uL (ref 4.0–10.5)
nRBC: 0 % (ref 0.0–0.2)

## 2021-07-16 LAB — BLOOD GAS, ARTERIAL
Acid-Base Excess: 0.5 mmol/L (ref 0.0–2.0)
Bicarbonate: 30.4 mmol/L — ABNORMAL HIGH (ref 20.0–28.0)
FIO2: 0.36
O2 Saturation: 86.8 %
Patient temperature: 37
pCO2 arterial: 76 mmHg (ref 32.0–48.0)
pH, Arterial: 7.21 — ABNORMAL LOW (ref 7.350–7.450)
pO2, Arterial: 64 mmHg — ABNORMAL LOW (ref 83.0–108.0)

## 2021-07-16 LAB — D-DIMER, QUANTITATIVE: D-Dimer, Quant: 4.18 ug/mL-FEU — ABNORMAL HIGH (ref 0.00–0.50)

## 2021-07-16 LAB — TROPONIN I (HIGH SENSITIVITY): Troponin I (High Sensitivity): 15 ng/L (ref <18)

## 2021-07-16 LAB — HIV ANTIBODY (ROUTINE TESTING W REFLEX): HIV Screen 4th Generation wRfx: NONREACTIVE

## 2021-07-16 MED ORDER — ALBUMIN HUMAN 25 % IV SOLN
12.5000 g | Freq: Once | INTRAVENOUS | Status: AC
  Administered 2021-07-16: 12.5 g via INTRAVENOUS
  Filled 2021-07-16: qty 50

## 2021-07-16 MED ORDER — MIDODRINE HCL 5 MG PO TABS
2.5000 mg | ORAL_TABLET | Freq: Three times a day (TID) | ORAL | Status: DC
  Administered 2021-07-17 – 2021-07-25 (×24): 2.5 mg via ORAL
  Filled 2021-07-16 (×25): qty 1

## 2021-07-16 MED ORDER — GABAPENTIN 300 MG PO CAPS
300.0000 mg | ORAL_CAPSULE | Freq: Every day | ORAL | Status: DC
  Administered 2021-07-16 – 2021-07-20 (×5): 300 mg via ORAL
  Filled 2021-07-16 (×6): qty 1

## 2021-07-16 MED ORDER — FLUTICASONE PROPIONATE 50 MCG/ACT NA SUSP
1.0000 | Freq: Every day | NASAL | Status: DC
  Administered 2021-07-16 – 2021-07-29 (×14): 1 via NASAL
  Filled 2021-07-16 (×2): qty 16

## 2021-07-16 MED ORDER — FUROSEMIDE 10 MG/ML IJ SOLN
40.0000 mg | Freq: Two times a day (BID) | INTRAMUSCULAR | Status: DC
  Administered 2021-07-16 – 2021-07-17 (×2): 40 mg via INTRAVENOUS
  Filled 2021-07-16 (×2): qty 4

## 2021-07-16 MED ORDER — SODIUM POLYSTYRENE SULFONATE 15 GM/60ML PO SUSP
15.0000 g | Freq: Once | ORAL | Status: AC
  Administered 2021-07-16: 15 g via ORAL
  Filled 2021-07-16: qty 60

## 2021-07-16 MED ORDER — GABAPENTIN 300 MG PO CAPS: 600.0000 mg | ORAL_CAPSULE | Freq: Two times a day (BID) | ORAL | Status: DC

## 2021-07-16 NOTE — ED Notes (Addendum)
Husband remains at bedside. Pt more somnolent and harder to arouse. MD paged.

## 2021-07-16 NOTE — ED Notes (Signed)
Pt placed on purewick to suction, warm blanket and socks given, denies other needs at this time, call bell in place, husband at bedside.

## 2021-07-16 NOTE — Consult Note (Signed)
CRITICAL CARE         Date: 07/16/2021,   MRN# [id number] Matisyn Cabeza Deloney 12-27-1955     AdmissionWeight: (!) 138.3 kg                 CurrentWeight: (!) 138.3 kg  Referring physician: Dr. 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  She also reported associated symptoms of cough which was dry and nonproductive however denied chest pain flulike illness nausea vomiting or diarrhea.  On arrival she was found to be acutely hypoxemic at 65% SPO2 but this did respond to supplemental oxygen and she had ABG with findings of pH 7.21 CO2 >70 with paO2 <50.  She was Covid negative on arrival.  Despite treatment for COPD she continues to exhibit signs of respiratory failure and is with lethargy and somnolence. PCCM consultation for futher evalaution and management.   PAST MEDICAL HISTORY   Past Medical History:  Diagnosis Date   CHF (congestive heart failure) ([place])    Diabetes mellitus without complication (HCC)    Hypothyroidism    Lymphedema    RLS (restless legs syndrome)    Sleep apnea    Thyroid cancer ([place]) 2007     SURGICAL HISTORY   Past Surgical History:  Procedure Laterality Date   ABDOMINAL HYSTERECTOMY     COLONOSCOPY WITH PROPOFOL N/A 08/26/2015   Procedure: COLONOSCOPY WITH PROPOFOL;  Surgeon: Manya Silvas, MD;  Location: [facility];  Service: Endoscopy;  Laterality: N/A;   THYROIDECTOMY       FAMILY HISTORY   Family History  Problem Relation Age of Onset   Breast cancer Neg Hx      SOCIAL  HISTORY   Social History   Tobacco Use   Smoking status: Never   Smokeless tobacco: Never  Substance Use Topics   Alcohol use: No   Drug use: No     MEDICATIONS    Home Medication:    Current Medication:  Current Facility-Administered Medications:    0.9 %  sodium chloride infusion, 250 mL, Intravenous, PRN, Cox, Amy N, DO   acetaminophen (TYLENOL) tablet 650 mg, 650 mg, Oral, Q4H PRN, Cox, Amy N, DO   atorvastatin (LIPITOR) tablet 20 mg, 20 mg, Oral, Q2200, Cox, Amy N, DO   buPROPion (WELLBUTRIN XL) 24 hr tablet 150 mg, 150 mg, Oral, Daily, Cox, Amy N, DO   carvedilol (COREG) tablet 6.25 mg, 6.25 mg, Oral, BID WC, Cox, Amy N, DO   fluticasone (FLONASE) 50 MCG/ACT nasal spray 1 spray, 1 spray, Each Nare, Daily, Darrick Meigs, Marge Duncans, MD, 1 spray at 07/16/21 1333   gabapentin (NEURONTIN) capsule 300 mg, 300 mg, Oral, Q1200, Cox, Amy N, DO, 300 mg at 07/16/21 1333   heparin injection 5,000 Units, 5,000 Units, Subcutaneous, Q8H, Cox, Amy N, DO, 5,000 Units at 07/16/21 1332   levothyroxine (SYNTHROID) tablet 125 mcg, 125 mcg, Oral, Daily, Cox, Amy N, DO   montelukast (SINGULAIR) tablet 10 mg, 10 mg, Oral, Daily, Cox, Amy N, DO   ondansetron (ZOFRAN) injection 4 mg,  4 mg, Intravenous, Q6H PRN, Cox, Amy N, DO, 4 mg at 07/16/21 0414   pantoprazole (PROTONIX) EC tablet 40 mg, 40 mg, Oral, Daily, Cox, Amy N, DO   rOPINIRole (REQUIP) tablet 3 mg, 3 mg, Oral, TID, Cox, Amy N, DO   sodium chloride flush (NS) 0.9 % injection 3 mL, 3 mL, Intravenous, Q12H, Cox, Amy N, DO, 3 mL at 07/16/21 0942   sodium chloride flush (NS) 0.9 % injection 3 mL, 3 mL, Intravenous, PRN, Cox, Amy N, DO  Current Outpatient Medications:    atorvastatin (LIPITOR) 20 MG tablet, Take 20 mg by mouth daily., Disp: , Rfl:    Biotin 2.5 MG CAPS, Take 2.5 mg by mouth daily. , Disp: , Rfl:    buPROPion (WELLBUTRIN XL) 150 MG 24 hr tablet, Take 150 mg by mouth daily., Disp: , Rfl:    carvedilol (COREG) 6.25  MG tablet, Take 1 tablet (6.25 mg total) by mouth 2 (two) times daily with a meal., Disp: 180 tablet, Rfl: 3   Cholecalciferol 25 MCG (1000 UT) tablet, Take 1 tablet by mouth daily., Disp: , Rfl:    gabapentin 50 MG TABS, Take 300 mg by mouth 3 (three) times daily. (Patient taking differently: Take 300 mg by mouth 3 (three) times daily. 2 caps in am , 1 cap at noon and 2 caps at bedtime), Disp: , Rfl:    levothyroxine (SYNTHROID) 125 MCG tablet, Take 250 mcg by mouth daily., Disp: , Rfl:    Multiple Vitamin (MULTIVITAMIN) tablet, Take 1 tablet by mouth daily., Disp: , Rfl:    pantoprazole (PROTONIX) 40 MG tablet, Take 40 mg by mouth daily., Disp: , Rfl:    potassium chloride SA (KLOR-CON) 20 MEQ tablet, Take 1 tablet (20 mEq total) by mouth daily., Disp: 90 tablet, Rfl: 3   propranolol (INDERAL) 20 MG tablet, Take 20 mg by mouth 3 (three) times daily., Disp: , Rfl:    rOPINIRole (REQUIP) 3 MG tablet, Take 3 mg by mouth 3 (three) times daily., Disp: , Rfl: 3   Semaglutide, 1 MG/DOSE, 2 MG/1.5ML SOPN, Inject 0.75 mLs into the skin once a week., Disp: , Rfl:    torsemide (DEMADEX) 20 MG tablet, Take 2 tablets (40 mg total) by mouth daily., Disp: 180 tablet, Rfl: 3   zinc gluconate 50 MG tablet, Take 50 mg by mouth daily., Disp: , Rfl:    atorvastatin (LIPITOR) 20 MG tablet, Take 20 mg by mouth daily at 10 pm., Disp: , Rfl:    montelukast (SINGULAIR) 10 MG tablet, Take 10 mg by mouth daily. (Patient not taking: Reported on 07/16/2021), Disp: , Rfl:     ALLERGIES   Patient has no known allergies.     REVIEW OF SYSTEMS    Review of Systems:  Gen:  Denies  fever, sweats, chills weigh loss  HEENT: Denies blurred vision, double vision, ear pain, eye pain, hearing loss, nose bleeds, sore throat Cardiac:  No dizziness, chest pain or heaviness, chest tightness,edema Resp:   reports cough, shortness of breath,wheezing, deinies hemoptysis,  Gi: Denies swallowing difficulty, stomach  pain, nausea or vomiting, diarrhea, constipation, bowel incontinence Gu:  Denies bladder incontinence, burning urine Ext:   Denies Joint pain, stiffness or swelling Skin: Denies  skin rash, easy bruising or bleeding or hives Endoc:  Denies polyuria, polydipsia , polyphagia or weight change Psych:   Denies depression, insomnia or hallucinations   Other:  All other systems negative   VS: BP 104/76  Pulse (!) 56    Temp 98.1 F (36.7 C) (Oral)    Resp (!) 26    Ht 5\' 5"  (1.651 m)    Wt (!) 138.3 kg    SpO2 96%    BMI 50.75 kg/m      PHYSICAL EXAM    GENERAL:Obese somnolent mild distress no fevers, chills,  HEAD: Normocephalic, atraumatic.  EYES: Pupils equal, round, reactive to light. Extraocular muscles intact. No scleral icterus.  MOUTH: dry mucosal membrane. Dentition intact. No abscess noted.  EAR, NOSE, THROAT: Clear without exudates. No external lesions.  NECK: Supple. No thyromegaly. No nodules. No JVD.  PULMONARY: Decreased breath sounds bilaterally without wheezing or rhonchorous lung sounds CARDIOVASCULAR: S1 and S2. Regular rate and rhythm. No murmurs, rubs, or gallops. No edema. Pedal pulses 2+ bilaterally.  GASTROINTESTINAL: Soft, nontender, nondistended. No masses. Positive bowel sounds. No hepatosplenomegaly.  Obese abdomen MUSCULOSKELETAL: No swelling, clubbing, or +2 edema. Range of motion full in all extremities.  NEUROLOGIC:GCS9  SKIN: No ulceration, lesions, rashes, or cyanosis. Skin warm and dry. Turgor intact.  PSYCHIATRIC: Mood, affect is flat      IMAGING    DG Chest 2 View  Result Date: 07/15/2021 CLINICAL DATA:  Dyspnea EXAM: CHEST - 2 VIEW COMPARISON:  03/14/2021 FINDINGS: Bibasilar opacification likely relates to overlying soft tissue. Lungs are clear. No pneumothorax or pleural effusion. Mild cardiomegaly is stable. Central pulmonary arterial enlargement is again noted in keeping with changes of pulmonary arterial hypertension. IMPRESSION: Stable  cardiomegaly. Morphologic changes in keeping with pulmonary arterial hypertension. Electronically Signed   By: Fidela Salisbury M.D.   On: 07/15/2021 22:15       ASSESSMENT/PLAN   Acute on chronic hypoxemic and hypercapnic respiratory failure -Likely due to thoracic restriction with obesity hypoventilation syndrome and obstructive sleep apnea overlap -Would like to perform spirometry with graph to evaluate residual lung function and stratification of ventilatory defect -Arterial blood gas to allow patient for BiPAP while here and qualification for home use -Consultation with [facility] for medical equipment and supplies  -Patient would benefit from bariatric evaluation -Complicated by fluid shifts secondary to heart failure and renal impairment -Would benefit from occupational and physical therapy -D-dimer to rule out pulmonary venous thromboembolism -RD nutritional evaluation for appropriate dietary changes with morbid obesity , hypothyroid and DM2   Severe hyperkalemia I kayexalate x 1  Pharmacy consult Lasix delivered x 2  Acute on chronic diastolic CHF  Continue BIPAP and lasix with albumin    GI/DVT ppx Protonix /heparin   Critical care provider statement:   Total critical care time: 33 minutes   Performed by: Lanney Gins MD   Critical care time was exclusive of separately billable procedures and treating other patients.   Critical care was necessary to treat or prevent imminent or life-threatening deterioration.   Critical care was time spent personally by me on the following activities: development of treatment plan with patient and/or surrogate as well as nursing, discussions with consultants, evaluation of patient's response to treatment, examination of patient, obtaining history from patient or surrogate, ordering and performing treatments and interventions, ordering and review of laboratory studies, ordering and review of radiographic studies, pulse oximetry and  re-evaluation of patient's condition.    Ottie Glazier, M.D.  Pulmonary & Critical Care Medicine        Ottie Glazier, M.D.  Division of [facility]

## 2021-07-16 NOTE — ED Notes (Signed)
Pt continues to doze off and oxygen saturations fall to the 70's. Attempted to place pt back on cpap and pt refusing at this time. States she is fine on oxygen. Pt oxygen increased to 6L [place].

## 2021-07-16 NOTE — ED Notes (Signed)
Pt stating "I feel funny, I'm feeling really short of breath." Pt noted to have O2 saturation in the 70s. RN made aware and is in room.

## 2021-07-16 NOTE — ED Notes (Signed)
Attempted to wean pt to room air without success. RA saturations were 77%.

## 2021-07-16 NOTE — Progress Notes (Signed)
Called by RN X2 to place pt on Bipap. Pt stated she was nauseous. Informed RN, RN to call when pt ready. Bipap left at bedside.

## 2021-07-16 NOTE — ED Notes (Signed)
Pt resting in bed, respirations even and unlabored. No distress noted.

## 2021-07-16 NOTE — ED Notes (Signed)
Bipap in place  pt sleeping   family with pt

## 2021-07-16 NOTE — Progress Notes (Signed)
[facility]  PROGRESS NOTE  Haneen Bernales RSW:[id number] DOB: [birth year] DOA: 07/15/2021 PCP: Rusty Aus, MD   Brief HPI:   65 year old female with medical history of morbid obesity, OSA on CPAP, CHF, depression, anxiety, hyperlipidemia, neuropathy, diabetes mellitus type 2 presented to the ED with complaints of shortness of breath.  As per patient she forgot to take her fluid medications for past 2 days.  She does not always wear her BiPAP machine at night.  Denies chest pain or nausea vomiting or diarrhea.  In the hospital patient requiring oxygen however at home she does not feel her oxygen. In the ED initially SPO2 was 85% on room air improved to 97% on 2 L of oxygen via nasal cannula.  Patient received furosemide 60 mg IV in the ED.    Subjective   Patient seen and examined, continues to have mild shortness of breath.  Attempts to wean off oxygen were unsuccessful.  Still requiring 3 L/min of oxygen via nasal cannula.   Assessment/Plan:    Acute hypoxemic respiratory failure -Secondary to CHF exacerbation -Likely from medication noncompliance -Patient received Lasix in the ED -Creatinine has improved to 1.76 today -We will consult cardiology for further dosing for diuresis considering patient's underlying cardiorenal syndrome  Acute kidney injury on CKD stage III -Secondary to cardiorenal syndrome -Creatinine improved 1.76 as above -Further diuresis per cardiology  OSA -Continue BiPAP at home  Hyperlipidemia -Continue atorvastatin 20 mg daily  Depression/anxiety -Continue bupropion  Restless leg syndrome -Continue ropinirole  Neuropathy -Continue gabapentin 300 mg 3 times daily   Medications     atorvastatin  20 mg Oral Q2200   buPROPion  150 mg Oral Daily   carvedilol  6.25 mg Oral BID WC   fluticasone  1 spray Each Nare Daily   gabapentin  300 mg Oral Q1200   gabapentin  600 mg Oral BID   heparin  5,000 Units Subcutaneous Q8H    levothyroxine  125 mcg Oral Daily   montelukast  10 mg Oral Daily   pantoprazole  40 mg Oral Daily   rOPINIRole  3 mg Oral TID   sodium chloride flush  3 mL Intravenous Q12H     Data Reviewed:   CBG:  No results for input(s): GLUCAP in the last 168 hours.  SpO2: 99 % O2 Flow Rate (L/min): 3 L/min    Vitals:   07/16/21 0400 07/16/21 0804 07/16/21 1145 07/16/21 1200  BP: 110/79 110/76  115/79  Pulse: 97 (!) 54 (!) 50 62  Resp: 12 16 (!) 22 (!) 22  Temp:      TempSrc:      SpO2: 93% 95% 99% 99%  Weight:      Height:        No intake or output data in the 24 hours ending 07/16/21 1541  No intake/output data recorded.  Filed Weights   07/15/21 2142  Weight: (!) 138.3 kg    Data Reviewed: Basic Metabolic Panel: Recent Labs  Lab 07/15/21 2147 07/16/21 0712  NA 137 137  K 6.0* 4.9  CL 98 99  CO2 29 27  GLUCOSE 134* 95  BUN 38* 39*  CREATININE 1.89* 1.76*  CALCIUM 8.3* 8.0*   Liver Function Tests: No results for input(s): AST, ALT, ALKPHOS, BILITOT, PROT, ALBUMIN in the last 168 hours. No results for input(s): LIPASE, AMYLASE in the last 168 hours. No results for input(s): AMMONIA in the last 168 hours. CBC: Recent Labs  Lab 07/15/21 2147 07/16/21  0712  WBC 10.5 10.1  HGB 12.2 11.9*  HCT 42.6 41.9  MCV 88.6 89.3  PLT 426* 378   Cardiac Enzymes: No results for input(s): CKTOTAL, CKMB, CKMBINDEX, TROPONINI in the last 168 hours. BNP (last 3 results) Recent Labs    03/14/21 1817 03/16/21 0819 07/15/21 2147  BNP 1,805.1* 1,352.0* 1,583.1*    ProBNP (last 3 results) No results for input(s): PROBNP in the last 8760 hours.  CBG: No results for input(s): GLUCAP in the last 168 hours.     Radiology Reports  DG Chest 2 View  Result Date: 07/15/2021 CLINICAL DATA:  Dyspnea EXAM: CHEST - 2 VIEW COMPARISON:  03/14/2021 FINDINGS: Bibasilar opacification likely relates to overlying soft tissue. Lungs are clear. No pneumothorax or pleural effusion.  Mild cardiomegaly is stable. Central pulmonary arterial enlargement is again noted in keeping with changes of pulmonary arterial hypertension. IMPRESSION: Stable cardiomegaly. Morphologic changes in keeping with pulmonary arterial hypertension. Electronically Signed   By: Fidela Salisbury M.D.   On: 07/15/2021 22:15       Antibiotics: Anti-infectives (From admission, onward)    None         DVT prophylaxis:   Code Status: Full code  Family Communication: No family at bedside   Consultants:   Procedures:     Objective    Physical Examination:  General-appears in no acute distress Heart-S1-S2, regular, no murmur auscultated Lungs-decreased breath sounds bilaterally Abdomen-soft, nontender, no organomegaly Extremities-no edema in the lower extremities Neuro-alert, oriented x3, no focal deficit noted  Status is: Inpatient  Dispo: The patient is from: Home              Anticipated d/c is to: Home              Anticipated d/c date is: 07/18/2021              Patient currently not stable for discharge  Barrier to discharge-ongoing evaluation and management for CHF exacerbation  COVID-19 Labs  No results for input(s): DDIMER, FERRITIN, LDH, CRP in the last 72 hours.  Lab Results  Component Value Date   SARSCOV2NAA NEGATIVE 07/15/2021   SARSCOV2NAA NEGATIVE 03/24/2021   [place] NEGATIVE 03/14/2021   [place] NEGATIVE 04/17/2020            Recent Results (from the past 240 hour(s))  Resp Panel by RT-PCR (Flu A&B, Covid) Nasopharyngeal Swab     Status: None   Collection Time: 07/15/21 10:29 PM   Specimen: Nasopharyngeal Swab; Nasopharyngeal(NP) swabs in vial transport medium  Result Value Ref Range Status   SARS Coronavirus 2 by RT PCR NEGATIVE NEGATIVE Final    Comment: (NOTE) SARS-CoV-2 target nucleic acids are NOT DETECTED.  The SARS-CoV-2 RNA is generally detectable in upper respiratory specimens during the acute phase of infection. The  lowest concentration of SARS-CoV-2 viral copies this assay can detect is 138 copies/mL. A negative result does not preclude SARS-Cov-2 infection and should not be used as the sole basis for treatment or other patient management decisions. A negative result may occur with  improper specimen collection/handling, submission of specimen other than nasopharyngeal swab, presence of viral mutation(s) within the areas targeted by this assay, and inadequate number of viral copies(<138 copies/mL). A negative result must be combined with clinical observations, patient history, and epidemiological information. The expected result is Negative.  Fact Sheet for Patients:  EntrepreneurPulse.com.au  Fact Sheet for Healthcare Providers:  IncredibleEmployment.be  This test is no t yet approved or cleared by the  [place] Architectural technologist and  has been authorized for detection and/or diagnosis of SARS-CoV-2 by FDA under an Print production planner (EUA). This EUA will remain  in effect (meaning this test can be used) for the duration of the COVID-19 declaration under Section 564(b)(1) of the Act, 21 U.S.C.section 360bbb-3(b)(1), unless the authorization is terminated  or revoked sooner.       Influenza A by PCR NEGATIVE NEGATIVE Final   Influenza B by PCR NEGATIVE NEGATIVE Final    Comment: (NOTE) The Xpert Xpress SARS-CoV-2/FLU/RSV plus assay is intended as an aid in the diagnosis of influenza from Nasopharyngeal swab specimens and should not be used as a sole basis for treatment. Nasal washings and aspirates are unacceptable for Xpert Xpress SARS-CoV-2/FLU/RSV testing.  Fact Sheet for Patients: EntrepreneurPulse.com.au  Fact Sheet for Healthcare Providers: IncredibleEmployment.be  This test is not yet approved or cleared by the [place] FDA and has been authorized for detection and/or diagnosis of SARS-CoV-2 by FDA under  an Emergency Use Authorization (EUA). This EUA will remain in effect (meaning this test can be used) for the duration of the COVID-19 declaration under Section 564(b)(1) of the Act, 21 U.S.C. section 360bbb-3(b)(1), unless the authorization is terminated or revoked.  Performed at [facility], [address], [place], [place] [address]     [facility] If 7PM-7AM, please contact night-coverage at [email or web address], Office  [phone]   07/16/2021, 3:41 PM  LOS: 0 days

## 2021-07-16 NOTE — ED Notes (Signed)
MD notified pt has had only approx 200cc urine output today. Bladder scan shows 25cc urine.

## 2021-07-16 NOTE — ED Notes (Signed)
CPAP removed, pt placed on 3L [place].

## 2021-07-16 NOTE — ED Notes (Signed)
Pt c/o of nausea. Zofran will be given

## 2021-07-16 NOTE — ED Notes (Signed)
No urine output at this time from patient.

## 2021-07-17 ENCOUNTER — Ambulatory Visit: Payer: Medicare Other | Admitting: Family

## 2021-07-17 DIAGNOSIS — E876 Hypokalemia: Secondary | ICD-10-CM | POA: Diagnosis present

## 2021-07-17 DIAGNOSIS — E875 Hyperkalemia: Secondary | ICD-10-CM | POA: Diagnosis present

## 2021-07-17 DIAGNOSIS — E890 Postprocedural hypothyroidism: Secondary | ICD-10-CM | POA: Diagnosis present

## 2021-07-17 DIAGNOSIS — F32A Depression, unspecified: Secondary | ICD-10-CM | POA: Diagnosis present

## 2021-07-17 DIAGNOSIS — E662 Morbid (severe) obesity with alveolar hypoventilation: Secondary | ICD-10-CM | POA: Diagnosis present

## 2021-07-17 DIAGNOSIS — G9341 Metabolic encephalopathy: Secondary | ICD-10-CM | POA: Diagnosis not present

## 2021-07-17 DIAGNOSIS — J9621 Acute and chronic respiratory failure with hypoxia: Secondary | ICD-10-CM | POA: Diagnosis present

## 2021-07-17 DIAGNOSIS — I10 Essential (primary) hypertension: Secondary | ICD-10-CM | POA: Diagnosis not present

## 2021-07-17 DIAGNOSIS — I509 Heart failure, unspecified: Secondary | ICD-10-CM | POA: Diagnosis not present

## 2021-07-17 DIAGNOSIS — I5033 Acute on chronic diastolic (congestive) heart failure: Secondary | ICD-10-CM | POA: Diagnosis present

## 2021-07-17 DIAGNOSIS — F419 Anxiety disorder, unspecified: Secondary | ICD-10-CM | POA: Diagnosis present

## 2021-07-17 DIAGNOSIS — I872 Venous insufficiency (chronic) (peripheral): Secondary | ICD-10-CM | POA: Diagnosis present

## 2021-07-17 DIAGNOSIS — J9622 Acute and chronic respiratory failure with hypercapnia: Secondary | ICD-10-CM | POA: Diagnosis present

## 2021-07-17 DIAGNOSIS — Z9114 Patient's other noncompliance with medication regimen: Secondary | ICD-10-CM | POA: Diagnosis not present

## 2021-07-17 DIAGNOSIS — I4891 Unspecified atrial fibrillation: Secondary | ICD-10-CM | POA: Diagnosis not present

## 2021-07-17 DIAGNOSIS — Z20822 Contact with and (suspected) exposure to covid-19: Secondary | ICD-10-CM | POA: Diagnosis present

## 2021-07-17 DIAGNOSIS — G4710 Hypersomnia, unspecified: Secondary | ICD-10-CM | POA: Diagnosis present

## 2021-07-17 DIAGNOSIS — E1122 Type 2 diabetes mellitus with diabetic chronic kidney disease: Secondary | ICD-10-CM | POA: Diagnosis present

## 2021-07-17 DIAGNOSIS — Z6841 Body Mass Index (BMI) 40.0 and over, adult: Secondary | ICD-10-CM | POA: Diagnosis not present

## 2021-07-17 DIAGNOSIS — E1140 Type 2 diabetes mellitus with diabetic neuropathy, unspecified: Secondary | ICD-10-CM | POA: Diagnosis present

## 2021-07-17 DIAGNOSIS — I130 Hypertensive heart and chronic kidney disease with heart failure and stage 1 through stage 4 chronic kidney disease, or unspecified chronic kidney disease: Secondary | ICD-10-CM | POA: Diagnosis present

## 2021-07-17 DIAGNOSIS — Z7189 Other specified counseling: Secondary | ICD-10-CM | POA: Diagnosis not present

## 2021-07-17 DIAGNOSIS — N179 Acute kidney failure, unspecified: Secondary | ICD-10-CM | POA: Diagnosis present

## 2021-07-17 DIAGNOSIS — G2581 Restless legs syndrome: Secondary | ICD-10-CM | POA: Diagnosis present

## 2021-07-17 DIAGNOSIS — J9601 Acute respiratory failure with hypoxia: Secondary | ICD-10-CM | POA: Diagnosis not present

## 2021-07-17 DIAGNOSIS — I5023 Acute on chronic systolic (congestive) heart failure: Secondary | ICD-10-CM | POA: Diagnosis not present

## 2021-07-17 DIAGNOSIS — R0602 Shortness of breath: Secondary | ICD-10-CM | POA: Diagnosis present

## 2021-07-17 DIAGNOSIS — I480 Paroxysmal atrial fibrillation: Secondary | ICD-10-CM | POA: Diagnosis present

## 2021-07-17 DIAGNOSIS — J449 Chronic obstructive pulmonary disease, unspecified: Secondary | ICD-10-CM | POA: Diagnosis present

## 2021-07-17 DIAGNOSIS — Z23 Encounter for immunization: Secondary | ICD-10-CM | POA: Diagnosis present

## 2021-07-17 DIAGNOSIS — N1831 Chronic kidney disease, stage 3a: Secondary | ICD-10-CM | POA: Diagnosis present

## 2021-07-17 LAB — RESPIRATORY PANEL BY PCR

## 2021-07-17 LAB — BASIC METABOLIC PANEL
Anion gap: 10 (ref 5–15)
BUN: 48 mg/dL — ABNORMAL HIGH (ref 8–23)
CO2: 28 mmol/L (ref 22–32)
Calcium: 7.9 mg/dL — ABNORMAL LOW (ref 8.9–10.3)
Chloride: 100 mmol/L (ref 98–111)
Creatinine, Ser: 2.23 mg/dL — ABNORMAL HIGH (ref 0.44–1.00)
GFR, Estimated: 24 mL/min — ABNORMAL LOW (ref >60)
Glucose, Bld: 86 mg/dL (ref 70–99)
Potassium: 5.5 mmol/L — ABNORMAL HIGH (ref 3.5–5.1)
Sodium: 138 mmol/L (ref 135–145)

## 2021-07-17 MED ORDER — FUROSEMIDE 10 MG/ML IJ SOLN
40.0000 mg | Freq: Every day | INTRAMUSCULAR | Status: DC
  Administered 2021-07-18 – 2021-07-22 (×5): 40 mg via INTRAVENOUS
  Filled 2021-07-17 (×5): qty 4

## 2021-07-17 MED ORDER — SODIUM CHLORIDE 0.9 % IV SOLN
2.0000 g | INTRAVENOUS | Status: DC
  Administered 2021-07-17 – 2021-07-19 (×3): 2 g via INTRAVENOUS
  Filled 2021-07-17: qty 20
  Filled 2021-07-17: qty 2
  Filled 2021-07-17: qty 20

## 2021-07-17 MED ORDER — SODIUM ZIRCONIUM CYCLOSILICATE 5 G PO PACK
5.0000 g | PACK | Freq: Once | ORAL | Status: AC
  Administered 2021-07-17: 5 g via ORAL
  Filled 2021-07-17: qty 1

## 2021-07-17 MED ORDER — CARVEDILOL 3.125 MG PO TABS
3.1250 mg | ORAL_TABLET | Freq: Two times a day (BID) | ORAL | Status: DC
  Administered 2021-07-17 – 2021-07-21 (×7): 3.125 mg via ORAL
  Filled 2021-07-17 (×8): qty 1

## 2021-07-17 NOTE — Consult Note (Signed)
CRITICAL CARE         Date: 07/17/2021,   MRN# [id number] Madison Mcbride 11-10-1955     AdmissionWeight: (!) 138.3 kg                 CurrentWeight: (!) 154.1 kg  Referring physician: Dr. 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  She also reported associated symptoms of cough which was dry and nonproductive however denied chest pain flulike illness nausea vomiting or diarrhea.  On arrival she was found to be acutely hypoxemic at 65% SPO2 but this did respond to supplemental oxygen and she had ABG with findings of pH 7.21 CO2 >70 with paO2 <50.  She was Covid negative on arrival.  Despite treatment for COPD she continues to exhibit signs of respiratory failure and is with lethargy and somnolence. PCCM consultation for futher evalaution and management.  07/17/21- patient on BIPAP, pt/ot today. Fluid restriction.   PAST MEDICAL HISTORY   Past Medical History:  Diagnosis Date   CHF (congestive heart failure) ([facility])    Diabetes mellitus without complication (HCC)    Hypothyroidism    Lymphedema    RLS (restless legs syndrome)    Sleep apnea    Thyroid cancer ([place]) 2007     SURGICAL HISTORY   Past Surgical History:  Procedure Laterality Date   ABDOMINAL HYSTERECTOMY     COLONOSCOPY WITH PROPOFOL N/A 08/26/2015   Procedure: COLONOSCOPY WITH PROPOFOL;  Surgeon: Manya Silvas, MD;  Location: [place];  Service: Endoscopy;  Laterality: N/A;   THYROIDECTOMY       FAMILY HISTORY   Family History  Problem  Relation Age of Onset   Breast cancer Neg Hx      SOCIAL HISTORY   Social History   Tobacco Use   Smoking status: Never   Smokeless tobacco: Never  Substance Use Topics   Alcohol use: No   Drug use: No     MEDICATIONS    Home Medication:    Current Medication:  Current Facility-Administered Medications:    0.9 %  sodium chloride infusion, 250 mL, Intravenous, PRN, Cox, Amy N, DO   acetaminophen (TYLENOL) tablet 650 mg, 650 mg, Oral, Q4H PRN, Cox, Amy N, DO, 650 mg at 07/17/21 0930   atorvastatin (LIPITOR) tablet 20 mg, 20 mg, Oral, Q2200, Cox, Amy N, DO   buPROPion (WELLBUTRIN XL) 24 hr tablet 150 mg, 150 mg, Oral, Daily, Cox, Amy N, DO, 150 mg at 07/17/21 0930   carvedilol (COREG) tablet 3.125 mg, 3.125 mg, Oral, BID WC, Tang, Lily Michelle, PA-C, 3.125 mg at 07/17/21 1840   cefTRIAXone (ROCEPHIN) 2 g in sodium chloride 0.9 % 100 mL IVPB, 2 g, Intravenous, Q24H, Darrick Meigs, Marge Duncans, MD, Stopped at 07/17/21 1043   fluticasone (FLONASE) 50 MCG/ACT nasal spray 1 spray, 1 spray, Each Nare, Daily, Darrick Meigs, Marge Duncans, MD, 1 spray at 07/17/21 0939   [START ON 07/18/2021] furosemide (LASIX) injection 40 mg, 40 mg, Intravenous, Daily, Tang, Alanson Puls, PA-C   gabapentin (NEURONTIN) capsule 300 mg, 300  mg, Oral, Q1200, Cox, Amy N, DO, 300 mg at 07/17/21 1238   heparin injection 5,000 Units, 5,000 Units, Subcutaneous, Q8H, Cox, Amy N, DO, 5,000 Units at 07/17/21 1240   levothyroxine (SYNTHROID) tablet 125 mcg, 125 mcg, Oral, Daily, Cox, Amy N, DO, 125 mcg at 07/17/21 0834   midodrine (PROAMATINE) tablet 2.5 mg, 2.5 mg, Oral, TID WC, Giovany Cosby, MD, 2.5 mg at 07/17/21 1840   montelukast (SINGULAIR) tablet 10 mg, 10 mg, Oral, Daily, Cox, Amy N, DO, 10 mg at 07/17/21 0931   ondansetron (ZOFRAN) injection 4 mg, 4 mg, Intravenous, Q6H PRN, Cox, Amy N, DO, 4 mg at 07/16/21 0414   pantoprazole (PROTONIX) EC tablet 40 mg, 40 mg, Oral, Daily, Cox, Amy N, DO, 40 mg at 07/17/21  0931   rOPINIRole (REQUIP) tablet 3 mg, 3 mg, Oral, TID, Cox, Amy N, DO, 3 mg at 07/17/21 1556   sodium chloride flush (NS) 0.9 % injection 3 mL, 3 mL, Intravenous, Q12H, Cox, Amy N, DO, 3 mL at 07/17/21 0958   sodium chloride flush (NS) 0.9 % injection 3 mL, 3 mL, Intravenous, PRN, Cox, Amy N, DO, 3 mL at 07/17/21 4818    ALLERGIES   Patient has no known allergies.     REVIEW OF SYSTEMS    Review of Systems:  Gen:  Denies  fever, sweats, chills weigh loss  HEENT: Denies blurred vision, double vision, ear pain, eye pain, hearing loss, nose bleeds, sore throat Cardiac:  No dizziness, chest pain or heaviness, chest tightness,edema Resp:   reports cough, shortness of breath,wheezing, deinies hemoptysis,  Gi: Denies swallowing difficulty, stomach pain, nausea or vomiting, diarrhea, constipation, bowel incontinence Gu:  Denies bladder incontinence, burning urine Ext:   Denies Joint pain, stiffness or swelling Skin: Denies  skin rash, easy bruising or bleeding or hives Endoc:  Denies polyuria, polydipsia , polyphagia or weight change Psych:   Denies depression, insomnia or hallucinations   Other:  All other systems negative   VS: BP 115/69 (BP Location: Left Arm)    Pulse (!) 58    Temp 98.4 F (36.9 C) (Oral)    Resp 20    Ht 5\' 5"  (1.651 m)    Wt (!) 154.1 kg    SpO2 97%    BMI 56.53 kg/m      PHYSICAL EXAM    GENERAL:Obese somnolent mild distress no fevers, chills,  HEAD: Normocephalic, atraumatic.  EYES: Pupils equal, round, reactive to light. Extraocular muscles intact. No scleral icterus.  MOUTH: dry mucosal membrane. Dentition intact. No abscess noted.  EAR, NOSE, THROAT: Clear without exudates. No external lesions.  NECK: Supple. No thyromegaly. No nodules. No JVD.  PULMONARY: Decreased breath sounds bilaterally without wheezing or rhonchorous lung sounds CARDIOVASCULAR: S1 and S2. Regular rate and rhythm. No murmurs, rubs, or gallops. No edema. Pedal pulses 2+  bilaterally.  GASTROINTESTINAL: Soft, nontender, nondistended. No masses. Positive bowel sounds. No hepatosplenomegaly.  Obese abdomen MUSCULOSKELETAL: No swelling, clubbing, or +2 edema. Range of motion full in all extremities.  NEUROLOGIC:GCS9  SKIN: No ulceration, lesions, rashes, or cyanosis. Skin warm and dry. Turgor intact.  PSYCHIATRIC: Mood, affect is flat      IMAGING    DG Chest 2 View  Result Date: 07/15/2021 CLINICAL DATA:  Dyspnea EXAM: CHEST - 2 VIEW COMPARISON:  03/14/2021 FINDINGS: Bibasilar opacification likely relates to overlying soft tissue. Lungs are clear. No pneumothorax or pleural effusion. Mild cardiomegaly is stable. Central pulmonary arterial enlargement is again noted in  keeping with changes of pulmonary arterial hypertension. IMPRESSION: Stable cardiomegaly. Morphologic changes in keeping with pulmonary arterial hypertension. Electronically Signed   By: Fidela Salisbury M.D.   On: 07/15/2021 22:15       ASSESSMENT/PLAN   Acute on chronic hypoxemic and hypercapnic respiratory failure -Likely due to thoracic restriction with obesity hypoventilation syndrome and obstructive sleep apnea overlap -Would like to perform spirometry with graph to evaluate residual lung function and stratification of ventilatory defect -Arterial blood gas to allow patient for BiPAP while here and qualification for home use -Consultation with [facility] for medical equipment and supplies  -Patient would benefit from bariatric evaluation -Complicated by fluid shifts secondary to heart failure and renal impairment -Would benefit from occupational and physical therapy -D-dimer to rule out pulmonary venous thromboembolism -RD nutritional evaluation for appropriate dietary changes with morbid obesity , hypothyroid and DM2   Severe hyperkalemia I kayexalate x 1  Pharmacy consult Lasix delivered x 2  Acute on chronic diastolic CHF  Continue BIPAP and lasix with albumin    GI/DVT  ppx Protonix /heparin   Critical care provider statement:   Total critical care time: 33 minutes   Performed by: Lanney Gins MD   Critical care time was exclusive of separately billable procedures and treating other patients.   Critical care was necessary to treat or prevent imminent or life-threatening deterioration.   Critical care was time spent personally by me on the following activities: development of treatment plan with patient and/or surrogate as well as nursing, discussions with consultants, evaluation of patient's response to treatment, examination of patient, obtaining history from patient or surrogate, ordering and performing treatments and interventions, ordering and review of laboratory studies, ordering and review of radiographic studies, pulse oximetry and re-evaluation of patient's condition.    Ottie Glazier, M.D.  Pulmonary & Critical Care Medicine        Ottie Glazier, M.D.  Division of [place]

## 2021-07-17 NOTE — Consult Note (Signed)
CARDIOLOGY CONSULT NOTE               Patient ID: Madison Mcbride MRN: [id number] DOB/AGE: 08-21-1955 66 y.o.  Admit date: 07/15/2021 Referring Physician Dr. Eleonore Chiquito Primary Physician Emily Filbert, MD Primary Cardiologist Nehemiah Massed Reason for Consultation heart failure  HPI: Patient is a 66 year old female with a past medical history of HFpEF (LVEF 45-50% 03/2021), OSA on CPAP, hyperlipidemia, type 2 diabetes who presented to [facility] ED 07/15/2021 with complaints of shortness of breath.  Cardiology is consulted for further management of her heart failure.  The patient was examined in the ED with her husband at bedside who contributes to the history.  The patient presented to the ED with increased shortness of breath and had reportedly not taken Lasix for 2 days prior to presentation because it was cumbersome for patient to get up to use the restroom frequently. She was not wearing her BIPAP at night prior to presentation either.  During interview today the patient is much more alert than she was on arrival and even yesterday.  The husband attributes this to patient sleeping very well with BiPAP last night and is hopeful that they can get a machine for her to wear like this at home.  The patient states her breathing is getting better however she still has some conversational dyspnea.  She thinks the swelling in her lower legs is improving, and she has known bilateral lower extremity cellulitis which has apparently been there for at least a couple months.  She was previously seen at wound care for this cellulitis.  Continues to endorse orthopnea which is not new for her, she normally sleeps at an angle in a recliner.  Denies chest pain, palpitations.  The patient's husband states she was hospitalized in early September 2022 with similar problems, fluid overload and shortness of breath.  She was seen for an initial consult with Dr. Nehemiah Massed 04/21/2021 and follow-up was supposed to be in her his  outpatient office this week.   Labs are significant for potassium 5.5, creatinine 2.23 which is increased from 1.76 yesterday, EGFR 24.  BNP is elevated to 1583, troponins flat 16-15.   Chest x-ray did not show any pneumothorax or pleural effusion.  Review of systems complete and found to be negative unless listed above     Past Medical History:  Diagnosis Date   CHF (congestive heart failure) ([place])    Diabetes mellitus without complication ([place])    Hypothyroidism    Lymphedema    RLS (restless legs syndrome)    Sleep apnea    Thyroid cancer ([place]) 2007    Past Surgical History:  Procedure Laterality Date   ABDOMINAL HYSTERECTOMY     COLONOSCOPY WITH PROPOFOL N/A 08/26/2015   Procedure: COLONOSCOPY WITH PROPOFOL;  Surgeon: Manya Silvas, MD;  Location: [place];  Service: Endoscopy;  Laterality: N/A;   THYROIDECTOMY      (Not in a hospital admission)  Social History   Socioeconomic History   Marital status: Married    Spouse name: Not on file   Number of children: Not on file   Years of education: Not on file   Highest education level: Not on file  Occupational History   Not on file  Tobacco Use   Smoking status: Never   Smokeless tobacco: Never  Substance and Sexual Activity   Alcohol use: No   Drug use: No   Sexual activity: Not on file  Other Topics Concern   Not on  file  Social History Narrative   Not on file   Social Determinants of Health   Financial Resource Strain: Not on file  Food Insecurity: Not on file  Transportation Needs: Not on file  Physical Activity: Not on file  Stress: Not on file  Social Connections: Not on file  Intimate Partner Violence: Not on file    Family History  Problem Relation Age of Onset   Breast cancer Neg Hx       Review of systems complete and found to be negative unless listed above    PHYSICAL EXAM General: Pleasant caucasian female, well nourished, in no acute distress. Husband at bedside, examined  sitting at incline in ED bed.  HEENT:  Normocephalic and atraumatic. Neck:  No JVD.  Lungs: Normal respiratory effort on 5L O2 by [place]. Clear bilaterally to auscultation. No wheezes, crackles, rhonchi.  Heart: HRRR . Normal S1 and S2 without gallops or murmurs. Radial & DP pulses 2+ bilaterally. Abdomen: Obese appearing.  Msk: Normal strength and tone for age. Extremities: 2+ bilateral LEE with erythema c/w cellulitis. Few scattered open healing ulcers, largest about a quarter size. No clubbing, cyanosis.   Neuro: Alert and oriented X 3. Psych:  Mood appropriate, affect congruent.   Labs:   Lab Results  Component Value Date   WBC 10.1 07/16/2021   HGB 11.9 (L) 07/16/2021   HCT 41.9 07/16/2021   MCV 89.3 07/16/2021   PLT 378 07/16/2021    Recent Labs  Lab 07/17/21 0706  NA 138  K 5.5*  CL 100  CO2 28  BUN 48*  CREATININE 2.23*  CALCIUM 7.9*  GLUCOSE 86   No results found for: CKTOTAL, CKMB, CKMBINDEX, TROPONINI  Lab Results  Component Value Date   CHOL 91 04/10/2020   Lab Results  Component Value Date   HDL 13 (L) 04/10/2020   Lab Results  Component Value Date   LDLCALC 56 04/10/2020   Lab Results  Component Value Date   TRIG 108 04/10/2020   Lab Results  Component Value Date   CHOLHDL 7.0 04/10/2020   No results found for: LDLDIRECT    Radiology: DG Chest 2 View  Result Date: 07/15/2021 CLINICAL DATA:  Dyspnea EXAM: CHEST - 2 VIEW COMPARISON:  03/14/2021 FINDINGS: Bibasilar opacification likely relates to overlying soft tissue. Lungs are clear. No pneumothorax or pleural effusion. Mild cardiomegaly is stable. Central pulmonary arterial enlargement is again noted in keeping with changes of pulmonary arterial hypertension. IMPRESSION: Stable cardiomegaly. Morphologic changes in keeping with pulmonary arterial hypertension. Electronically Signed   By: Fidela Salisbury M.D.   On: 07/15/2021 22:15    ECHO 03/2021: LVEF 45-50%  TELEMETRY reviewed by me: NSR and rate  of 61  EKG reviewed by me: sinus brady with 1* AV block, rate 54, similar to last EKG in 03/2021  ASSESSMENT AND PLAN:  Patient is a 66 year old female with a past medical history of HFpEF (LVEF 45-50% 03/2021), OSA on CPAP, hyperlipidemia, type 2 diabetes who presented [facility] ED 07/15/2021 with complaints of shortness of breath.  Cardiology is consulted for further management of her heart failure.  #acute hypercarbic and hypoxemic respiratory failure #OSA on CPAP #HFpEF (LVEF 45-50% 03/2021) #hypertension #AKI  Pt's husband reports a couple day non compliance with CPAP and lasix dosing prior to presentation which resulted in hypersomnolence during the day and significant SOB. She has improved some with IV lasix and overnight BIPAP, but is still not at baseline.  -recommend supplemental oxygen  as needed during the day and compliance with BIPAP at night and at home.  -s/p IV lasix 67m x1 and 463mx2 yesterday. Will decrease dose to 4045mV once daily due to pt's worsening renal function -Creatinine increasing to 2.23 today (1.76 yesterday and baseline ~1.1-1.2) -Home GDMT regimen appears to be carvedilol 6.11m68mD, torsemine 40mg7mly. Carvedilol dose reduced to 3.125 d/t hypotension overnight. Will continue to monitor and advance as tolerated.  -agree with low dose midodrine 2.5mg T71mas BP is soft with goals to discontinue before discharge.  #hyperlipidemia Continue lipitor 20mg  53me 2 diabetes As per primary team, consider initiation of SGLT2i on an outpatient basis. Appears pt was on a GLP1 in the past.   This patient was seen and examined by Dr. AlexandIsaias Cowman is in agreement with this plan of care.   Signed: Maelie Chriswell MiTristan Schroeder 07/17/2021, 8:31 AM

## 2021-07-17 NOTE — Progress Notes (Signed)
[facility]  PROGRESS NOTE  Madison Mcbride PTW:[id number] DOB: [birth year] DOA: 07/15/2021 PCP: Rusty Aus, MD   Brief HPI:   66 year old female with medical history of morbid obesity, OSA on CPAP, CHF, depression, anxiety, hyperlipidemia, neuropathy, diabetes mellitus type 2 presented to the ED with complaints of shortness of breath.  As per patient she forgot to take her fluid medications for past 2 days.  She does not always wear her BiPAP machine at night.  Denies chest pain or nausea vomiting or diarrhea.  In the hospital patient requiring oxygen however at home she does not feel her oxygen. In the ED initially SPO2 was 85% on room air improved to 97% on 2 L of oxygen via nasal cannula.  Patient received furosemide 60 mg IV in the ED.    Subjective   Yesterday patient became somnolent, ABG showed PCO2 of 76, pH 7.21.  Pulmonology was consulted, patient placed on BiPAP.  This morning patient is alert, oriented x3.  Denies shortness of breath.  Cardiology also was consulted for guidance regarding diuresis for CHF as patient appears dry.   Assessment/Plan:    Acute hypercarbic and hypoxemic respiratory failure -Secondary to CHF exacerbation, OSA, OHS -Patient received Lasix in the ED -Creatinine has worsened to 2.23 today -Cardiology and pulmonology have been consulted  Acute kidney injury on CKD stage III -Secondary to cardiorenal syndrome -Creatinine worsened to 2.23 today -Further diuresis per cardiology  Right leg cellulitis -Patient has erythema and tenderness palpation in right lower extremity -We will start ceftriaxone 1 g IV every 24 hours   OSA -Continue BiPAP at home -Pulmonology following  Hyperlipidemia -Continue atorvastatin 20 mg daily  Depression/anxiety -Continue bupropion  Restless leg syndrome -Continue ropinirole  Neuropathy -Continue Requip -We will hold gabapentin due to hypersomnolence  Hyperkalemia -Mild, potassium was  5.5 -We will give 1 dose of Lokelma 5 g p.o. x1   Medications     atorvastatin  20 mg Oral Q2200   buPROPion  150 mg Oral Daily   carvedilol  6.25 mg Oral BID WC   fluticasone  1 spray Each Nare Daily   furosemide  40 mg Intravenous BID   gabapentin  300 mg Oral Q1200   heparin  5,000 Units Subcutaneous Q8H   levothyroxine  125 mcg Oral Daily   midodrine  2.5 mg Oral TID WC   montelukast  10 mg Oral Daily   pantoprazole  40 mg Oral Daily   rOPINIRole  3 mg Oral TID   sodium chloride flush  3 mL Intravenous Q12H     Data Reviewed:   CBG:  No results for input(s): GLUCAP in the last 168 hours.  SpO2: 97 % O2 Flow Rate (L/min): 5 L/min    Vitals:   07/17/21 0130 07/17/21 0200 07/17/21 0400 07/17/21 0831  BP: 113/65 125/70 (!) 115/59 (!) 132/103  Pulse: (!) 50 61 (!) 55 (!) 56  Resp: (!) 21 (!) 28 18 18   Temp:      TempSrc:      SpO2: 95% 94% 95% 97%  Weight:      Height:         Intake/Output Summary (Last 24 hours) at 07/17/2021 0844 Last data filed at 07/17/2021 0624 Gross per 24 hour  Intake --  Output 700 ml  Net -700 ml    01/03 1901 - 01/05 0700 In: -  Out: 700 [Urine:700]  Filed Weights   07/15/21 2142  Weight: (!) 138.3 kg  Data Reviewed: Basic Metabolic Panel: Recent Labs  Lab 07/15/21 2147 07/16/21 0712 07/17/21 0706  NA 137 137 138  K 6.0* 4.9 5.5*  CL 98 99 100  CO2 29 27 28   GLUCOSE 134* 95 86  BUN 38* 39* 48*  CREATININE 1.89* 1.76* 2.23*  CALCIUM 8.3* 8.0* 7.9*   Liver Function Tests: No results for input(s): AST, ALT, ALKPHOS, BILITOT, PROT, ALBUMIN in the last 168 hours. No results for input(s): LIPASE, AMYLASE in the last 168 hours. No results for input(s): AMMONIA in the last 168 hours. CBC: Recent Labs  Lab 07/15/21 2147 07/16/21 0712  WBC 10.5 10.1  HGB 12.2 11.9*  HCT 42.6 41.9  MCV 88.6 89.3  PLT 426* 378   Cardiac Enzymes: No results for input(s): CKTOTAL, CKMB, CKMBINDEX, TROPONINI in the last 168  hours. BNP (last 3 results) Recent Labs    03/14/21 1817 03/16/21 0819 07/15/21 2147  BNP 1,805.1* 1,352.0* 1,583.1*    ProBNP (last 3 results) No results for input(s): PROBNP in the last 8760 hours.  CBG: No results for input(s): GLUCAP in the last 168 hours.     Radiology Reports  DG Chest 2 View  Result Date: 07/15/2021 CLINICAL DATA:  Dyspnea EXAM: CHEST - 2 VIEW COMPARISON:  03/14/2021 FINDINGS: Bibasilar opacification likely relates to overlying soft tissue. Lungs are clear. No pneumothorax or pleural effusion. Mild cardiomegaly is stable. Central pulmonary arterial enlargement is again noted in keeping with changes of pulmonary arterial hypertension. IMPRESSION: Stable cardiomegaly. Morphologic changes in keeping with pulmonary arterial hypertension. Electronically Signed   By: Fidela Salisbury M.D.   On: 07/15/2021 22:15       Antibiotics: Anti-infectives (From admission, onward)    None         DVT prophylaxis:   Code Status: Full code  Family Communication: No family at bedside   Consultants:   Procedures:     Objective    Physical Examination:  General-appears in no acute distress Heart-S1-S2, regular, no murmur auscultated Lungs-decreased breath sounds bilaterally Abdomen-soft, nontender, no organomegaly Extremities-patient has erythema in right lower extremity with open wound, tenderness to palpation  Neuro-alert, oriented x3, no focal deficit noted  Status is: Inpatient  Dispo: The patient is from: Home              Anticipated d/c is to: Home              Anticipated d/c date is: 07/18/2021              Patient currently not stable for discharge  Barrier to discharge-ongoing evaluation and management for CHF exacerbation  COVID-19 Labs  Recent Labs    07/16/21 1842  DDIMER 4.18*    Lab Results  Component Value Date   SARSCOV2NAA NEGATIVE 07/15/2021   [place] NEGATIVE 03/24/2021   [place] NEGATIVE 03/14/2021    [place] NEGATIVE 04/17/2020            Recent Results (from the past 240 hour(s))  Resp Panel by RT-PCR (Flu A&B, Covid) Nasopharyngeal Swab     Status: None   Collection Time: 07/15/21 10:29 PM   Specimen: Nasopharyngeal Swab; Nasopharyngeal(NP) swabs in vial transport medium  Result Value Ref Range Status   SARS Coronavirus 2 by RT PCR NEGATIVE NEGATIVE Final    Comment: (NOTE) SARS-CoV-2 target nucleic acids are NOT DETECTED.  The SARS-CoV-2 RNA is generally detectable in upper respiratory specimens during the acute phase of infection. The lowest concentration of SARS-CoV-2 viral copies this  assay can detect is 138 copies/mL. A negative result does not preclude SARS-Cov-2 infection and should not be used as the sole basis for treatment or other patient management decisions. A negative result may occur with  improper specimen collection/handling, submission of specimen other than nasopharyngeal swab, presence of viral mutation(s) within the areas targeted by this assay, and inadequate number of viral copies(<138 copies/mL). A negative result must be combined with clinical observations, patient history, and epidemiological information. The expected result is Negative.  Fact Sheet for Patients:  EntrepreneurPulse.com.au  Fact Sheet for Healthcare Providers:  IncredibleEmployment.be  This test is no t yet approved or cleared by the [place] FDA and  has been authorized for detection and/or diagnosis of SARS-CoV-2 by FDA under an Emergency Use Authorization (EUA). This EUA will remain  in effect (meaning this test can be used) for the duration of the COVID-19 declaration under Section 564(b)(1) of the Act, 21 U.S.C.section 360bbb-3(b)(1), unless the authorization is terminated  or revoked sooner.       Influenza A by PCR NEGATIVE NEGATIVE Final   Influenza B by PCR NEGATIVE NEGATIVE Final    Comment: (NOTE) The Xpert Xpress  SARS-CoV-2/FLU/RSV plus assay is intended as an aid in the diagnosis of influenza from Nasopharyngeal swab specimens and should not be used as a sole basis for treatment. Nasal washings and aspirates are unacceptable for Xpert Xpress SARS-CoV-2/FLU/RSV testing.  Fact Sheet for Patients: EntrepreneurPulse.com.au  Fact Sheet for Healthcare Providers: IncredibleEmployment.be  This test is not yet approved or cleared by the [place] FDA and has been authorized for detection and/or diagnosis of SARS-CoV-2 by FDA under an Emergency Use Authorization (EUA). This EUA will remain in effect (meaning this test can be used) for the duration of the COVID-19 declaration under Section 564(b)(1) of the Act, 21 U.S.C. section 360bbb-3(b)(1), unless the authorization is terminated or revoked.  Performed at [facility], [place], [place] [address]   Respiratory (~20 pathogens) panel by PCR     Status: None   Collection Time: 07/16/21  9:40 PM   Specimen: Nasopharyngeal Swab; Respiratory  Result Value Ref Range Status   Adenovirus NOT DETECTED NOT DETECTED Final   Coronavirus 229E NOT DETECTED NOT DETECTED Final    Comment: (NOTE) The Coronavirus on the Respiratory Panel, DOES NOT test for the novel  Coronavirus (2019 nCoV)    Coronavirus HKU1 NOT DETECTED NOT DETECTED Final   Coronavirus NL63 NOT DETECTED NOT DETECTED Final   Coronavirus OC43 NOT DETECTED NOT DETECTED Final   Metapneumovirus NOT DETECTED NOT DETECTED Final   Rhinovirus / Enterovirus NOT DETECTED NOT DETECTED Final   Influenza A NOT DETECTED NOT DETECTED Final   Influenza B NOT DETECTED NOT DETECTED Final   Parainfluenza Virus 1 NOT DETECTED NOT DETECTED Final   Parainfluenza Virus 2 NOT DETECTED NOT DETECTED Final   Parainfluenza Virus 3 NOT DETECTED NOT DETECTED Final   Parainfluenza Virus 4 NOT DETECTED NOT DETECTED Final   Respiratory Syncytial Virus NOT  DETECTED NOT DETECTED Final   Bordetella pertussis NOT DETECTED NOT DETECTED Final   Bordetella Parapertussis NOT DETECTED NOT DETECTED Final   Chlamydophila pneumoniae NOT DETECTED NOT DETECTED Final   Mycoplasma pneumoniae NOT DETECTED NOT DETECTED Final    Comment: Performed at [facility], [place]. [address], [place], [place] [address]    [facility] If 7PM-7AM, please contact night-coverage at [email or web address], Office  [phone]   07/17/2021, 8:44 AM  LOS: 0 days

## 2021-07-18 ENCOUNTER — Inpatient Hospital Stay: Payer: Medicare Other

## 2021-07-18 DIAGNOSIS — I5033 Acute on chronic diastolic (congestive) heart failure: Secondary | ICD-10-CM

## 2021-07-18 DIAGNOSIS — I10 Essential (primary) hypertension: Secondary | ICD-10-CM | POA: Diagnosis not present

## 2021-07-18 DIAGNOSIS — J9601 Acute respiratory failure with hypoxia: Secondary | ICD-10-CM | POA: Diagnosis not present

## 2021-07-18 LAB — BLOOD GAS, ARTERIAL
Acid-Base Excess: 3.3 mmol/L — ABNORMAL HIGH (ref 0.0–2.0)
Acid-Base Excess: 3.1 mmol/L — ABNORMAL HIGH (ref 0.0–2.0)
Bicarbonate: 33.6 mmol/L — ABNORMAL HIGH (ref 20.0–28.0)
Bicarbonate: 32.6 mmol/L — ABNORMAL HIGH (ref 20.0–28.0)
Delivery systems: POSITIVE
Delivery systems: POSITIVE
Expiratory PAP: 8
Expiratory PAP: 8
FIO2: 0.4
FIO2: 0.4
Inspiratory PAP: 16
MECHVT: 500 mL
Mechanical Rate: 14
Mechanical Rate: 24
O2 Saturation: 96.3 %
O2 Saturation: 96.5 %
Patient temperature: 37
Patient temperature: 37
RATE: 14 resp/min
RATE: 24 resp/min
pCO2 arterial: 82 mmHg (ref 32.0–48.0)
pCO2 arterial: 76 mmHg (ref 32.0–48.0)
pH, Arterial: 7.22 — ABNORMAL LOW (ref 7.350–7.450)
pH, Arterial: 7.24 — ABNORMAL LOW (ref 7.350–7.450)
pO2, Arterial: 99 mmHg (ref 83.0–108.0)
pO2, Arterial: 99 mmHg (ref 83.0–108.0)

## 2021-07-18 LAB — BASIC METABOLIC PANEL
Anion gap: 9 (ref 5–15)
BUN: 52 mg/dL — ABNORMAL HIGH (ref 8–23)
CO2: 31 mmol/L (ref 22–32)
Calcium: 8 mg/dL — ABNORMAL LOW (ref 8.9–10.3)
Chloride: 98 mmol/L (ref 98–111)
Creatinine, Ser: 2.16 mg/dL — ABNORMAL HIGH (ref 0.44–1.00)
GFR, Estimated: 25 mL/min — ABNORMAL LOW (ref >60)
Glucose, Bld: 85 mg/dL (ref 70–99)
Potassium: 4.8 mmol/L (ref 3.5–5.1)
Sodium: 138 mmol/L (ref 135–145)

## 2021-07-18 LAB — GLUCOSE, CAPILLARY: Glucose-Capillary: 89 mg/dL (ref 70–99)

## 2021-07-18 LAB — MRSA NEXT GEN BY PCR, NASAL: MRSA by PCR Next Gen: NOT DETECTED

## 2021-07-18 MED ORDER — TECHNETIUM TO 99M ALBUMIN AGGREGATED
4.0000 | Freq: Once | INTRAVENOUS | Status: AC | PRN
  Administered 2021-07-18: 4.46 via INTRAVENOUS

## 2021-07-18 MED ORDER — CHLORHEXIDINE GLUCONATE CLOTH 2 % EX PADS
6.0000 | MEDICATED_PAD | Freq: Every day | CUTANEOUS | Status: DC
  Administered 2021-07-19 – 2021-07-29 (×10): 6 via TOPICAL

## 2021-07-18 NOTE — Progress Notes (Signed)
° ° ° °CRITICAL CARE ° ° ° ° ° ° ° ° °Date: 07/18/2021,   °MRN# [id number] Madison Mcbride 12/20/1955 ° ° °  °AdmissionWeight: (!) 138.3 kg                 °CurrentWeight: (!) 157.7 kg ° °Referring physician: Dr. Lama ° ° ° °CHIEF COMPLAINT:  °Acute on chronic hypoxemic and hypercapnic respiratory failure ° ° °HISTORY OF PRESENT ILLNESS  ° °This is a pleasant 66-year-old female with a history significant for morbid obesity with a BMI over 55, chronic heart failure with preserved EF, thyroid cancer, restless leg syndrome, hypothyroidism, diabetes, obstructive sleep apnea, CKD, came into the hospital via emergency room for dyspnea and shortness of breath at rest which lasted for the last 3 to 4 days.  She also reported associated symptoms of cough which was dry and nonproductive however denied chest pain flulike illness nausea vomiting or diarrhea.  On arrival she was found to be acutely hypoxemic at 65% SPO2 but this did respond to supplemental oxygen and she had ABG with findings of pH 7.21 CO2 >70 with paO2 <50.  She was Covid negative on arrival.  Despite treatment for COPD she continues to exhibit signs of respiratory failure and is with lethargy and somnolence. PCCM consultation for futher evalaution and management. ° °07/17/21- patient on BIPAP, pt/ot today. Fluid restriction.  °07/18/21- patient seemed slightly improved today, I met with family and reviewed care plan.  She may benefit from palliative care evaluation.  ° °PAST MEDICAL HISTORY  ° °Past Medical History:  °Diagnosis Date  ° CHF (congestive heart failure) (HCC)   ° Diabetes mellitus without complication (HCC)   ° Hypothyroidism   ° Lymphedema   ° RLS (restless legs syndrome)   ° Sleep apnea   ° Thyroid cancer (HCC) 2007  ° ° ° °SURGICAL HISTORY  ° °Past Surgical History:  °Procedure Laterality Date  ° ABDOMINAL HYSTERECTOMY    ° COLONOSCOPY WITH PROPOFOL N/A 08/26/2015  ° Procedure: COLONOSCOPY WITH PROPOFOL;  Surgeon: Robert T Elliott, MD;   Location: [facility] ENDOSCOPY;  Service: Endoscopy;  Laterality: N/A;  ° THYROIDECTOMY    ° ° ° °FAMILY HISTORY  ° °Family History  °Problem Relation Age of Onset  ° Breast cancer Neg Hx   ° ° ° °SOCIAL HISTORY  ° °Social History  ° °Tobacco Use  ° Smoking status: Never  ° Smokeless tobacco: Never  °Substance Use Topics  ° Alcohol use: No  ° Drug use: No  ° ° ° °MEDICATIONS  ° ° °Home Medication:  °  °Current Medication: ° °Current Facility-Administered Medications:  °  0.9 %  sodium chloride infusion, 250 mL, Intravenous, PRN, Cox, Amy N, DO °  acetaminophen (TYLENOL) tablet 650 mg, 650 mg, Oral, Q4H PRN, Cox, Amy N, DO, 650 mg at 07/17/21 0930 °  atorvastatin (LIPITOR) tablet 20 mg, 20 mg, Oral, Q2200, Cox, Amy N, DO, 20 mg at 07/17/21 2011 °  buPROPion (WELLBUTRIN XL) 24 hr tablet 150 mg, 150 mg, Oral, Daily, Cox, Amy N, DO, 150 mg at 07/18/21 0935 °  carvedilol (COREG) tablet 3.125 mg, 3.125 mg, Oral, BID WC, Tang, Lily Michelle, PA-C, 3.125 mg at 07/18/21 0936 °  cefTRIAXone (ROCEPHIN) 2 g in sodium chloride 0.9 % 100 mL IVPB, 2 g, Intravenous, Q24H, Lama, Gagan S, MD, Last Rate: 200 mL/hr at 07/18/21 0952, 2 g at 07/18/21 0952 °  Chlorhexidine Gluconate Cloth 2 % PADS 6 each, 6 each, Topical, Daily,   Daily, Lang Snow, NP   fluticasone (FLONASE) 50 MCG/ACT nasal spray 1 spray, 1 spray, Each Nare, Daily, Darrick Meigs, Marge Duncans, MD, 1 spray at 07/17/21 0939   furosemide (LASIX) injection 40 mg, 40 mg, Intravenous, Daily, Tang, Lily Michelle, PA-C, 40 mg at 07/18/21 0936   gabapentin (NEURONTIN) capsule 300 mg, 300 mg, Oral, Q1200, Cox, Amy N, DO, 300 mg at 07/17/21 1238   heparin injection 5,000 Units, 5,000 Units, Subcutaneous, Q8H, Cox, Amy N, DO, 5,000 Units at 07/18/21 0754   levothyroxine (SYNTHROID) tablet 125 mcg, 125 mcg, Oral, Daily, Cox, Amy N, DO, 125 mcg at 07/18/21 0549   midodrine (PROAMATINE) tablet 2.5 mg, 2.5 mg, Oral, TID WC, Dahna Hattabaugh, MD, 2.5 mg at 07/18/21 0936   montelukast (SINGULAIR)  tablet 10 mg, 10 mg, Oral, Daily, Cox, Amy N, DO, 10 mg at 07/18/21 0935   ondansetron (ZOFRAN) injection 4 mg, 4 mg, Intravenous, Q6H PRN, Cox, Amy N, DO, 4 mg at 07/16/21 0414   pantoprazole (PROTONIX) EC tablet 40 mg, 40 mg, Oral, Daily, Cox, Amy N, DO, 40 mg at 07/18/21 0935   rOPINIRole (REQUIP) tablet 3 mg, 3 mg, Oral, TID, Cox, Amy N, DO, 3 mg at 07/18/21 0935   sodium chloride flush (NS) 0.9 % injection 3 mL, 3 mL, Intravenous, Q12H, Cox, Amy N, DO, 3 mL at 07/18/21 0937   sodium chloride flush (NS) 0.9 % injection 3 mL, 3 mL, Intravenous, PRN, Cox, Amy N, DO, 3 mL at 07/17/21 4403    ALLERGIES   Patient has no known allergies.     REVIEW OF SYSTEMS    Review of Systems:  Gen:  Denies  fever, sweats, chills weigh loss  HEENT: Denies blurred vision, double vision, ear pain, eye pain, hearing loss, nose bleeds, sore throat Cardiac:  No dizziness, chest pain or heaviness, chest tightness,edema Resp:   reports cough, shortness of breath,wheezing, deinies hemoptysis,  Gi: Denies swallowing difficulty, stomach pain, nausea or vomiting, diarrhea, constipation, bowel incontinence Gu:  Denies bladder incontinence, burning urine Ext:   Denies Joint pain, stiffness or swelling Skin: Denies  skin rash, easy bruising or bleeding or hives Endoc:  Denies polyuria, polydipsia , polyphagia or weight change Psych:   Denies depression, insomnia or hallucinations   Other:  All other systems negative   VS: BP 115/60    Pulse (!) 56    Temp 97.9 F (36.6 C) (Oral)    Resp 17    Ht 5' 5" (1.651 m)    Wt (!) 157.7 kg    SpO2 95%    BMI 57.85 kg/m      PHYSICAL EXAM    GENERAL:Obese somnolent mild distress no fevers, chills,  HEAD: Normocephalic, atraumatic.  EYES: Pupils equal, round, reactive to light. Extraocular muscles intact. No scleral icterus.  MOUTH: dry mucosal membrane. Dentition intact. No abscess noted.  EAR, NOSE, THROAT: Clear without exudates. No external lesions.   NECK: Supple. No thyromegaly. No nodules. No JVD.  PULMONARY: Decreased breath sounds bilaterally without wheezing or rhonchorous lung sounds CARDIOVASCULAR: S1 and S2. Regular rate and rhythm. No murmurs, rubs, or gallops. No edema. Pedal pulses 2+ bilaterally.  GASTROINTESTINAL: Soft, nontender, nondistended. No masses. Positive bowel sounds. No hepatosplenomegaly.  Obese abdomen MUSCULOSKELETAL: No swelling, clubbing, or +2 edema. Range of motion full in all extremities.  NEUROLOGIC:GCS9  SKIN: No ulceration, lesions, rashes, or cyanosis. Skin warm and dry. Turgor intact.  PSYCHIATRIC: Mood, affect is flat  IMAGING    DG Chest 2 View  Result Date: 07/15/2021 CLINICAL DATA:  Dyspnea EXAM: CHEST - 2 VIEW COMPARISON:  03/14/2021 FINDINGS: Bibasilar opacification likely relates to overlying soft tissue. Lungs are clear. No pneumothorax or pleural effusion. Mild cardiomegaly is stable. Central pulmonary arterial enlargement is again noted in keeping with changes of pulmonary arterial hypertension. IMPRESSION: Stable cardiomegaly. Morphologic changes in keeping with pulmonary arterial hypertension. Electronically Signed   By: Fidela Salisbury M.D.   On: 07/15/2021 22:15   DG Chest Port 1 View  Result Date: 07/18/2021 CLINICAL DATA:  Dyspnea EXAM: PORTABLE CHEST 1 VIEW COMPARISON:  Chest x-ray 07/15/2021 FINDINGS: Cardiomegaly and mediastinum appear unchanged. Pulmonary vascular congestion with increasing opacities in the lower lung zones since previous study. No large pleural effusion appreciated. No pneumothorax. IMPRESSION: Cardiomegaly with increasing pulmonary vascular congestion and lower lung zone opacities which may represent edema. Electronically Signed   By: Ofilia Neas M.D.   On: 07/18/2021 10:06       ASSESSMENT/PLAN   Acute on chronic hypoxemic and hypercapnic respiratory failure -Likely due to thoracic restriction with obesity hypoventilation syndrome and obstructive  sleep apnea overlap -Would like to perform spirometry with graph to evaluate residual lung function and stratification of ventilatory defect -Arterial blood gas to allow patient for BiPAP while here and qualification for home use -Consultation with [facility] for medical equipment and supplies  -Patient would benefit from bariatric evaluation -Complicated by fluid shifts secondary to heart failure and renal impairment -Would benefit from occupational and physical therapy -D-dimer to rule out pulmonary venous thromboembolism -RD nutritional evaluation for appropriate dietary changes with morbid obesity , hypothyroid and DM2   Severe hyperkalemia I kayexalate x 1  Pharmacy consult Lasix delivered x 2           -continue diuresis and BIPAP   Acute on chronic diastolic CHF  Continue BIPAP and lasix with albumin    GI/DVT ppx Protonix /heparin   Critical care provider statement:   Total critical care time: 33 minutes   Performed by: Lanney Gins MD   Critical care time was exclusive of separately billable procedures and treating other patients.   Critical care was necessary to treat or prevent imminent or life-threatening deterioration.   Critical care was time spent personally by me on the following activities: development of treatment plan with patient and/or surrogate as well as nursing, discussions with consultants, evaluation of patient's response to treatment, examination of patient, obtaining history from patient or surrogate, ordering and performing treatments and interventions, ordering and review of laboratory studies, ordering and review of radiographic studies, pulse oximetry and re-evaluation of patient's condition.    Ottie Glazier, M.D.  Pulmonary & Critical Care Medicine        Ottie Glazier, M.D.  Division of [facility]

## 2021-07-18 NOTE — Progress Notes (Signed)
ABG taken and latest pCO2 is 82. Pt seen by NP Hassan Rowan, pt's husband was also notified by NP Hassan Rowan of the pt's status. To be transferred to ICU bed 9.

## 2021-07-18 NOTE — Progress Notes (Addendum)
Brief Patient Description:  66 year old female with a history significant for morbid obesity with a BMI over 55, chronic heart failure with preserved EF, thyroid cancer, restless leg syndrome, hypothyroidism, diabetes, obstructive sleep apnea, CKD admitted with acute hypoxic hypercapnic respiratory failure.  Overnight, patient was Noted pt to be lethargic. Arousable to pain and verbal stimuli. She's been on the Bipap for about 3 hours now, but noted to be more lethargic now. Latest O2 sat: 97%, BP: 107/68.   ASSESSMENT Patient with hypoventilation syndrome and not compliant with BiPAP at home. Arterial Blood Gas result:  pO2 99; pCO2 82; pH 7.22;  HCO3 33.6, %O2 Sat 96.3. She was switched to AVAPs mode and repeat assessment in an hour showed some improvement. Arterial Blood Gas result:  pO2 99; pCO2 76; pH 7.24;  HCO3 32.6, %O2 Sat 96.5. She is becoming more responsive and able to answer orientation question.    PLAN Acute Hypoxic Hypercapnic Respiratory Failure secondary to Acute Decompensated HFpEF & Obstructive Sleep Apnea  and Hypoventilation syndrome  -Supplemental O2 as needed to maintain O2 saturations 88 to 92% -BiPAP, AVAPs mode, wean as tolerated -Follow intermittent ABG and chest x-ray as needed -IV Lasix as blood pressure and renal function permits; currently on Lasix 40 mg IV BID -As needed bronchodilators   Acute Metabolic Encephalopathy due to Hypercapnia~Improved on BiPAP -Provide supportive care -BiPAP to treat Hypercapnia    Patient is stable and appears to be compensating well. Expect some degree of hypercapnia in this pt with hx of OSA noncompliant with BiPAP, morbidly obese with hypoventilation syndrome.  No critical care needs at this time. Will need to optimize her respiratory status.    Rufina Falco, DNP, CCRN, FNP-C, AGACNP-BC Acute Care Nurse Practitioner  [facility] Pager: [phone] [place] at [facility]

## 2021-07-18 NOTE — Progress Notes (Signed)
[facility]  PROGRESS NOTE  Madison Mcbride ION:[id number] DOB: 19-Dec-1955 DOA: 07/15/2021 PCP: Rusty Aus, MD   Brief HPI:   66 year old female with medical history of morbid obesity, OSA on CPAP, CHF, depression, anxiety, hyperlipidemia, neuropathy, diabetes mellitus type 2 presented to the ED with complaints of shortness of breath.  As per patient she forgot to take her fluid medications for past 2 days.  She does not always wear her BiPAP machine at night.  Denies chest pain or nausea vomiting or diarrhea.  In the hospital patient requiring oxygen however at home she does not feel her oxygen. In the ED initially SPO2 was 85% on room air improved to 97% on 2 L of oxygen via nasal cannula.  Patient received furosemide 60 mg IV in the ED. ABG showed PCO2 of 76, pH 7.21.  Pulmonology was consulted, patient placed on BiPAP.  This morning patient is alert, oriented x3.  Denies shortness of breath.  Cardiology also was consulted for guidance regarding diuresis for CHF    Subjective   Patient seen and examined, she was transferred to ICU yesterday due to worsening acute hypercapnic respiratory failure.  Currently on BiPAP.  Having some myoclonic jerks.   Assessment/Plan:    Acute hypercarbic and hypoxemic respiratory failure -Secondary to CHF exacerbation, OSA, OHS -Patient initially received Lasix, developed worsening of mentation.  ABG showed acute hypercapnic respiratory failure with PCO2 76 -Patient was started on BiPAP, pulmonology was consulted -Also consulted cardiology for helping with diuresis -Patient developed worsening of mental status last night so had to be transferred out of floor to the ICU.   Acute kidney injury on CKD stage III -Secondary to cardiorenal syndrome -Creatinine slightly improved to 2.16 -She is on Lasix 40 mg IV daily  Right leg cellulitis -Significantly improved after starting ceftriaxone -Patient had erythema and tenderness palpation in  right lower extremity -Started on ceftriaxone as above  Elevated D-dimer -D-dimer was checked was elevated at 4.18 -We will obtain VQ scan to rule out pulmonary embolism -Cannot obtain CTA chest due to renal insufficiency   OSA -Continue BiPAP at home -Pulmonology following  Hyperlipidemia -Continue atorvastatin 20 mg daily  Depression/anxiety -Continue bupropion  Restless leg syndrome -Continue ropinirole  Neuropathy -Continue Requip -We will hold gabapentin due to hypersomnolence  Hyperkalemia -Replete   Medications     atorvastatin  20 mg Oral Q2200   buPROPion  150 mg Oral Daily   carvedilol  3.125 mg Oral BID WC   Chlorhexidine Gluconate Cloth  6 each Topical Daily   fluticasone  1 spray Each Nare Daily   furosemide  40 mg Intravenous Daily   gabapentin  300 mg Oral Q1200   heparin  5,000 Units Subcutaneous Q8H   levothyroxine  125 mcg Oral Daily   midodrine  2.5 mg Oral TID WC   montelukast  10 mg Oral Daily   pantoprazole  40 mg Oral Daily   rOPINIRole  3 mg Oral TID   sodium chloride flush  3 mL Intravenous Q12H     Data Reviewed:   CBG:  Recent Labs  Lab 07/18/21 0524  GLUCAP 89    SpO2: 94 % O2 Flow Rate (L/min): 5 L/min FiO2 (%): 40 %    Vitals:   07/18/21 0530 07/18/21 0535 07/18/21 0604 07/18/21 0800  BP: 116/76 116/76 117/84 92/78  Pulse: (!) 50 (!) 48 (!) 50 (!) 53  Resp: 14 (!) 9 15 12   Temp: 97.7 F (36.5 C)  97.9 F (36.6 C)  TempSrc: Axillary   Oral  SpO2: 100% 100% 97% 94%  Weight: (!) 157.7 kg     Height: 5\' 5"  (1.651 m)        Intake/Output Summary (Last 24 hours) at 07/18/2021 0957 Last data filed at 07/18/2021 0530 Gross per 24 hour  Intake --  Output 300 ml  Net -300 ml    01/04 1901 - 01/06 0700 In: -  Out: 1000 [Urine:1000]  Filed Weights   07/15/21 2142 07/17/21 1857 07/18/21 0530  Weight: (!) 138.3 kg (!) 154.1 kg (!) 157.7 kg    Data Reviewed: Basic Metabolic Panel: Recent Labs  Lab  07/15/21 2147 07/16/21 0712 07/17/21 0706 07/18/21 0535  NA 137 137 138 138  K 6.0* 4.9 5.5* 4.8  CL 98 99 100 98  CO2 29 27 28 31   GLUCOSE 134* 95 86 85  BUN 38* 39* 48* 52*  CREATININE 1.89* 1.76* 2.23* 2.16*  CALCIUM 8.3* 8.0* 7.9* 8.0*   Liver Function Tests: No results for input(s): AST, ALT, ALKPHOS, BILITOT, PROT, ALBUMIN in the last 168 hours. No results for input(s): LIPASE, AMYLASE in the last 168 hours. No results for input(s): AMMONIA in the last 168 hours. CBC: Recent Labs  Lab 07/15/21 2147 07/16/21 0712  WBC 10.5 10.1  HGB 12.2 11.9*  HCT 42.6 41.9  MCV 88.6 89.3  PLT 426* 378   Cardiac Enzymes: No results for input(s): CKTOTAL, CKMB, CKMBINDEX, TROPONINI in the last 168 hours. BNP (last 3 results) Recent Labs    03/14/21 1817 03/16/21 0819 07/15/21 2147  BNP 1,805.1* 1,352.0* 1,583.1*    ProBNP (last 3 results) No results for input(s): PROBNP in the last 8760 hours.  CBG: Recent Labs  Lab 07/18/21 9528  UXLKGM 01       Radiology Reports  No results found.     Antibiotics: Anti-infectives (From admission, onward)    Start     Dose/Rate Route Frequency Ordered Stop   07/17/21 0900  cefTRIAXone (ROCEPHIN) 2 g in sodium chloride 0.9 % 100 mL IVPB        2 g 200 mL/hr over 30 Minutes Intravenous Every 24 hours 07/17/21 0850 07/24/21 0859         DVT prophylaxis:   Code Status: Full code  Family Communication: No family at bedside   Consultants:   Procedures:     Objective    Physical Examination:  General-appears in no acute distress Heart-S1-S2, regular, no murmur auscultated Lungs-decreased breath sounds bilaterally Abdomen-soft, nontender, no organomegaly Extremities-1+  edema in the lower extremities Neuro-alert, oriented x3, no focal deficit noted  Status is: Inpatient  Dispo: The patient is from: Home              Anticipated d/c is to: Home              Anticipated d/c date is: 07/20/2021               Patient currently not stable for discharge  Barrier to discharge-ongoing evaluation and management for CHF exacerbation  COVID-19 Labs  Recent Labs    07/16/21 1842  DDIMER 4.18*    Lab Results  Component Value Date   SARSCOV2NAA NEGATIVE 07/15/2021   [place] NEGATIVE 03/24/2021   [place] NEGATIVE 03/14/2021   [place] NEGATIVE 04/17/2020            Recent Results (from the past 240 hour(s))  Resp Panel by RT-PCR (Flu A&B, Covid) Nasopharyngeal Swab  Status: None   Collection Time: 07/15/21 10:29 PM   Specimen: Nasopharyngeal Swab; Nasopharyngeal(NP) swabs in vial transport medium  Result Value Ref Range Status   SARS Coronavirus 2 by RT PCR NEGATIVE NEGATIVE Final    Comment: (NOTE) SARS-CoV-2 target nucleic acids are NOT DETECTED.  The SARS-CoV-2 RNA is generally detectable in upper respiratory specimens during the acute phase of infection. The lowest concentration of SARS-CoV-2 viral copies this assay can detect is 138 copies/mL. A negative result does not preclude SARS-Cov-2 infection and should not be used as the sole basis for treatment or other patient management decisions. A negative result may occur with  improper specimen collection/handling, submission of specimen other than nasopharyngeal swab, presence of viral mutation(s) within the areas targeted by this assay, and inadequate number of viral copies(<138 copies/mL). A negative result must be combined with clinical observations, patient history, and epidemiological information. The expected result is Negative.  Fact Sheet for Patients:  EntrepreneurPulse.com.au  Fact Sheet for Healthcare Providers:  IncredibleEmployment.be  This test is no t yet approved or cleared by the [place] FDA and  has been authorized for detection and/or diagnosis of SARS-CoV-2 by FDA under an Emergency Use Authorization (EUA). This EUA will remain  in effect  (meaning this test can be used) for the duration of the COVID-19 declaration under Section 564(b)(1) of the Act, 21 U.S.C.section 360bbb-3(b)(1), unless the authorization is terminated  or revoked sooner.       Influenza A by PCR NEGATIVE NEGATIVE Final   Influenza B by PCR NEGATIVE NEGATIVE Final    Comment: (NOTE) The Xpert Xpress SARS-CoV-2/FLU/RSV plus assay is intended as an aid in the diagnosis of influenza from Nasopharyngeal swab specimens and should not be used as a sole basis for treatment. Nasal washings and aspirates are unacceptable for Xpert Xpress SARS-CoV-2/FLU/RSV testing.  Fact Sheet for Patients: EntrepreneurPulse.com.au  Fact Sheet for Healthcare Providers: IncredibleEmployment.be  This test is not yet approved or cleared by the [place] FDA and has been authorized for detection and/or diagnosis of SARS-CoV-2 by FDA under an Emergency Use Authorization (EUA). This EUA will remain in effect (meaning this test can be used) for the duration of the COVID-19 declaration under Section 564(b)(1) of the Act, 21 U.S.C. section 360bbb-3(b)(1), unless the authorization is terminated or revoked.  Performed at [facility], [place], [place] [address]   Respiratory (~20 pathogens) panel by PCR     Status: None   Collection Time: 07/16/21  9:40 PM   Specimen: Nasopharyngeal Swab; Respiratory  Result Value Ref Range Status   Adenovirus NOT DETECTED NOT DETECTED Final   Coronavirus 229E NOT DETECTED NOT DETECTED Final    Comment: (NOTE) The Coronavirus on the Respiratory Panel, DOES NOT test for the novel  Coronavirus (2019 nCoV)    Coronavirus HKU1 NOT DETECTED NOT DETECTED Final   Coronavirus NL63 NOT DETECTED NOT DETECTED Final   Coronavirus OC43 NOT DETECTED NOT DETECTED Final   Metapneumovirus NOT DETECTED NOT DETECTED Final   Rhinovirus / Enterovirus NOT DETECTED NOT DETECTED Final   Influenza A  NOT DETECTED NOT DETECTED Final   Influenza B NOT DETECTED NOT DETECTED Final   Parainfluenza Virus 1 NOT DETECTED NOT DETECTED Final   Parainfluenza Virus 2 NOT DETECTED NOT DETECTED Final   Parainfluenza Virus 3 NOT DETECTED NOT DETECTED Final   Parainfluenza Virus 4 NOT DETECTED NOT DETECTED Final   Respiratory Syncytial Virus NOT DETECTED NOT DETECTED Final   Bordetella pertussis  NOT DETECTED NOT DETECTED Final   Bordetella Parapertussis NOT DETECTED NOT DETECTED Final   Chlamydophila pneumoniae NOT DETECTED NOT DETECTED Final   Mycoplasma pneumoniae NOT DETECTED NOT DETECTED Final    Comment: Performed at [facility], [place] [address], [place], [place] [address]  MRSA Next Gen by PCR, Nasal     Status: None   Collection Time: 07/18/21  4:52 AM   Specimen: Nasal Mucosa; Nasal Swab  Result Value Ref Range Status   MRSA by PCR Next Gen NOT DETECTED NOT DETECTED Final    Comment: (NOTE) The GeneXpert MRSA Assay (FDA approved for NASAL specimens only), is one component of a comprehensive MRSA colonization surveillance program. It is not intended to diagnose MRSA infection nor to guide or monitor treatment for MRSA infections. Test performance is not FDA approved in patients less than 45 years old. Performed at [facility], [address] [place], [place] [address]     [facility] If 7PM-7AM, please contact night-coverage at [email or web address], Office  [phone]   07/18/2021, 9:57 AM  LOS: 1 day

## 2021-07-18 NOTE — Progress Notes (Signed)
OT Cancellation Note  Patient Details Name: Madison Mcbride MRN: [id number] DOB: 10-14-1955   Cancelled Treatment:    Reason Eval/Treat Not Completed: Medical issues which prohibited therapy. OT order received. Chart reviewed. Pt noted to be on BiPap for respiratory support. Not currently able to participate in OT evaluation. Will hold at this time and re-attempt at a later date/time as available and pt medically appropriate.   Shara Blazing, M.S., OTR/L [facility] Ascom: [phone] 07/18/21, 12:50 PM

## 2021-07-18 NOTE — Progress Notes (Signed)
Noted pt to be lethargic. Arousable to pain and verbal stimuli. She's been on the Bipap for about 3 hours now, but noted to be more lethargic now. Latest O2 sat: 97%, BP: 107/68. NP Hassan Rowan made aware. Will continue to monitor.

## 2021-07-18 NOTE — Progress Notes (Signed)
Patient noted to be on bipap at start of my shift and resting comfortably. Metaneb not started.

## 2021-07-18 NOTE — Progress Notes (Signed)
Settings changed in the [facility]. Will wait for an hour before transfer to ICU. RN Vicente Males informed.

## 2021-07-18 NOTE — Progress Notes (Signed)
CARDIOLOGY CONSULT NOTE               Patient ID: Madison Mcbride MRN: [id number] DOB/AGE: 68/09/1955 68 y.o.  Admit date: 07/15/2021 Referring Physician Dr. Eleonore Chiquito Primary Physician Emily Filbert, MD Primary Cardiologist Nehemiah Massed Reason for Consultation heart failure  HPI: Patient is a 65 year old female with a past medical history of HFpEF (LVEF 45-50% 03/2021), OSA on CPAP, hyperlipidemia, type 2 diabetes who presented to [facility] ED 07/15/2021 with complaints of shortness of breath.  Cardiology is consulted for further management of her heart failure.  Interval history:  -Patient was admitted o the ICU overnight and was quite lethargic and confused.  Mode on BiPAP was changed and patient eventually responded well. -She remains on BiPAP during interview, husband and 2 sons are at bedside.  -Denies chest pain, complains of pain and irritation from her left lower leg wound  Review of systems complete and found to be negative unless listed above     Past Medical History:  Diagnosis Date   CHF (congestive heart failure) ([place])    Diabetes mellitus without complication ([place])    Hypothyroidism    Lymphedema    RLS (restless legs syndrome)    Sleep apnea    Thyroid cancer ([place]) 2007    Past Surgical History:  Procedure Laterality Date   ABDOMINAL HYSTERECTOMY     COLONOSCOPY WITH PROPOFOL N/A 08/26/2015   Procedure: COLONOSCOPY WITH PROPOFOL;  Surgeon: Manya Silvas, MD;  Location: [facility] ENDOSCOPY;  Service: Endoscopy;  Laterality: N/A;   THYROIDECTOMY      Medications Prior to Admission  Medication Sig Dispense Refill Last Dose   atorvastatin (LIPITOR) 20 MG tablet Take 20 mg by mouth daily.   07/15/2021   Biotin 2.5 MG CAPS Take 2.5 mg by mouth daily.    07/15/2021   buPROPion (WELLBUTRIN XL) 150 MG 24 hr tablet Take 150 mg by mouth daily.   07/15/2021   carvedilol (COREG) 6.25 MG tablet Take 1 tablet (6.25 mg total) by mouth 2 (two) times daily with a meal. 180 tablet 3  07/15/2021   Cholecalciferol 25 MCG (1000 UT) tablet Take 1 tablet by mouth daily.   07/15/2021   gabapentin 50 MG TABS Take 300 mg by mouth 3 (three) times daily. (Patient taking differently: Take 300 mg by mouth 3 (three) times daily. 2 caps in am , 1 cap at noon and 2 caps at bedtime)   07/15/2021   levothyroxine (SYNTHROID) 125 MCG tablet Take 250 mcg by mouth daily.   07/15/2021   Multiple Vitamin (MULTIVITAMIN) tablet Take 1 tablet by mouth daily.   07/15/2021   pantoprazole (PROTONIX) 40 MG tablet Take 40 mg by mouth daily.   07/15/2021   potassium chloride SA (KLOR-CON) 20 MEQ tablet Take 1 tablet (20 mEq total) by mouth daily. 90 tablet 3 07/15/2021   propranolol (INDERAL) 20 MG tablet Take 20 mg by mouth 3 (three) times daily.   07/15/2021   rOPINIRole (REQUIP) 3 MG tablet Take 3 mg by mouth 3 (three) times daily.  3 07/15/2021   Semaglutide, 1 MG/DOSE, 2 MG/1.5ML SOPN Inject 0.75 mLs into the skin once a week.   Past Month   torsemide (DEMADEX) 20 MG tablet Take 2 tablets (40 mg total) by mouth daily. 180 tablet 3 07/15/2021   zinc gluconate 50 MG tablet Take 50 mg by mouth daily.   07/15/2021   atorvastatin (LIPITOR) 20 MG tablet Take 20 mg by mouth daily at 10  pm.      montelukast (SINGULAIR) 10 MG tablet Take 10 mg by mouth daily. (Patient not taking: Reported on 07/16/2021)   Not Taking    Social History   Socioeconomic History   Marital status: Married    Spouse name: Not on file   Number of children: Not on file   Years of education: Not on file   Highest education level: Not on file  Occupational History   Not on file  Tobacco Use   Smoking status: Never   Smokeless tobacco: Never  Substance and Sexual Activity   Alcohol use: No   Drug use: No   Sexual activity: Not on file  Other Topics Concern   Not on file  Social History Narrative   Not on file   Social Determinants of Health   Financial Resource Strain: Not on file  Food Insecurity: Not on file  Transportation Needs: Not on  file  Physical Activity: Not on file  Stress: Not on file  Social Connections: Not on file  Intimate Partner Violence: Not on file    Family History  Problem Relation Age of Onset   Breast cancer Neg Hx       Review of systems complete and found to be negative unless listed above    PHYSICAL EXAM General: Pleasant caucasian female, well nourished, in no acute distress. Husband and 2 sons at bedside, sitting at incline with BiPAP HEENT:  Normocephalic and atraumatic. Neck:  No JVD.  Lungs: Normal respiratory effort on BiPAP. clear bilaterally to auscultation. No wheezes, crackles, rhonchi.  Heart: HRRR . Normal S1 and S2 difficult to assess for murmurs with BiPAP.  Radial & DP pulses 2+ bilaterally. Abdomen: Obese appearing.  Msk: Normal strength and tone for age. Extremities: 2+ bilateral LEE with improving erythema c/w cellulitis. Few scattered healing ulcers, largest about a quarter size. No clubbing, cyanosis.   Neuro: Alert and oriented X 3. Psych:  Mood appropriate, affect congruent.   Labs:   Lab Results  Component Value Date   WBC 10.1 07/16/2021   HGB 11.9 (L) 07/16/2021   HCT 41.9 07/16/2021   MCV 89.3 07/16/2021   PLT 378 07/16/2021    Recent Labs  Lab 07/18/21 0535  NA 138  K 4.8  CL 98  CO2 31  BUN 52*  CREATININE 2.16*  CALCIUM 8.0*  GLUCOSE 85    No results found for: CKTOTAL, CKMB, CKMBINDEX, TROPONINI  Lab Results  Component Value Date   CHOL 91 04/10/2020   Lab Results  Component Value Date   HDL 13 (L) 04/10/2020   Lab Results  Component Value Date   LDLCALC 56 04/10/2020   Lab Results  Component Value Date   TRIG 108 04/10/2020   Lab Results  Component Value Date   CHOLHDL 7.0 04/10/2020   No results found for: LDLDIRECT    Radiology: DG Chest 2 View  Result Date: 07/15/2021 CLINICAL DATA:  Dyspnea EXAM: CHEST - 2 VIEW COMPARISON:  03/14/2021 FINDINGS: Bibasilar opacification likely relates to overlying soft tissue. Lungs  are clear. No pneumothorax or pleural effusion. Mild cardiomegaly is stable. Central pulmonary arterial enlargement is again noted in keeping with changes of pulmonary arterial hypertension. IMPRESSION: Stable cardiomegaly. Morphologic changes in keeping with pulmonary arterial hypertension. Electronically Signed   By: Fidela Salisbury M.D.   On: 07/15/2021 22:15    ECHO 03/2021: LVEF 45-50%  TELEMETRY reviewed by me: NSR and rate of 61  EKG reviewed by me: sinus  brady with 1* AV block, rate 54, similar to last EKG in 03/2021  ASSESSMENT AND PLAN:  Patient is a 66 year old female with a past medical history of HFpEF (LVEF 45-50% 03/2021), OSA on CPAP, hyperlipidemia, type 2 diabetes who presented to [facility] ED 07/15/2021 with complaints of shortness of breath.  Cardiology is consulted for further management of her heart failure.  #acute hypercarbic and hypoxemic respiratory failure #OSA on CPAP #HFpEF (LVEF 45-50% 03/2021) #hypertension #AKI  Pt's husband reports a couple day non compliance with CPAP and lasix dosing prior to presentation which resulted in hypersomnolence during the day and significant SOB. She has improved some with IV lasix and overnight BIPAP, but is still not at baseline.  -discussed with primary Dr. Darrick Meigs who recommends ordering a VQ scan for further delineation of an etiology of her respiratory failure, which is quite reasonable. D-dimer was elevated to 4.18 on admission.  -recommend supplemental oxygen as needed during the day and compliance with BIPAP at night and at home.  -s/p IV lasix 60mg  x1 and 40mg  x2 in the ED. Continue lasix 40mg  IV once daily and continue to monitor renal function -Creatinine improving slightly after decreasing lasix frequency yesterday, trending 1.89-1.76-2.23-2.16. -Home GDMT regimen appears to be carvedilol 6.25mg  BID, torsemine 40mg  daily. Carvedilol dose reduced to 3.125 d/t hypotension overnight. Will continue to monitor and advance as tolerated.   -agree with low dose midodrine 2.5mg  TID as BP is soft with goals to discontinue before discharge.  #hyperlipidemia Continue lipitor 20mg   #type 2 diabetes As per primary team, consider initiation of SGLT2i on an outpatient basis. Appears pt was on a GLP1 in the past.   #Bilateral lower extremity cellulitis -On ceftriaxone, erythema improving -Discussed with nursing staff to place cream on right lower extremity for patient comfort.  Consider wound care consult if needed.  This patient was seen and examined by Dr. Isaias Cowman and he is in agreement with this plan of care.   Signed: Tristan Schroeder , PA-C 07/18/2021, 9:54 AM

## 2021-07-18 NOTE — Progress Notes (Signed)
PT Cancellation Note  Patient Details Name: Madison Mcbride MRN: [id number] DOB: 09-28-1955   Cancelled Treatment:    Reason Eval/Treat Not Completed: Patient not medically ready. PT order received. Chart reviewed. Pt noted to be on BiPap for respiratory support. Not currently able to participate in PT evaluation. Will hold at this time and re-attempt at a later date/time as available and pt medically appropriate.   Lieutenant Diego PT, DPT 12:53 PM,07/18/21

## 2021-07-18 NOTE — Progress Notes (Signed)
Cross Cover Patient with increased lethargy. Despite BIPAP support she has not improved. ABG with CO2 higher than prior at 82 up from 73 Pulmonology following prior but no official consult for PCCM. Ddimer elevated above 4 but no follow up imaging to rule  out PE ordered. Critical care consulted for worsening respiratory failure. Discussed case with Rufina Falco NP. Plan for transfer to ICU and try inreased/different mode on bipap and possible intubation. Husband updated on phone

## 2021-07-19 ENCOUNTER — Inpatient Hospital Stay: Payer: Medicare Other

## 2021-07-19 ENCOUNTER — Encounter: Payer: Self-pay | Admitting: Family Medicine

## 2021-07-19 LAB — BLOOD GAS, VENOUS
Acid-Base Excess: 4.9 mmol/L — ABNORMAL HIGH (ref 0.0–2.0)
Bicarbonate: 34.1 mmol/L — ABNORMAL HIGH (ref 20.0–28.0)
FIO2: 0.36
O2 Saturation: 96.2 %
Patient temperature: 37
pCO2, Ven: 76 mmHg (ref 44.0–60.0)
pH, Ven: 7.26 (ref 7.250–7.430)
pO2, Ven: 95 mmHg — ABNORMAL HIGH (ref 32.0–45.0)

## 2021-07-19 LAB — COMPREHENSIVE METABOLIC PANEL
ALT: 68 U/L — ABNORMAL HIGH (ref 0–44)
AST: 71 U/L — ABNORMAL HIGH (ref 15–41)
Albumin: 3 g/dL — ABNORMAL LOW (ref 3.5–5.0)
Alkaline Phosphatase: 108 U/L (ref 38–126)
Anion gap: 8 (ref 5–15)
BUN: 51 mg/dL — ABNORMAL HIGH (ref 8–23)
CO2: 31 mmol/L (ref 22–32)
Calcium: 7.9 mg/dL — ABNORMAL LOW (ref 8.9–10.3)
Chloride: 100 mmol/L (ref 98–111)
Creatinine, Ser: 1.97 mg/dL — ABNORMAL HIGH (ref 0.44–1.00)
GFR, Estimated: 27 mL/min — ABNORMAL LOW (ref >60)
Glucose, Bld: 90 mg/dL (ref 70–99)
Potassium: 4.5 mmol/L (ref 3.5–5.1)
Sodium: 139 mmol/L (ref 135–145)
Total Bilirubin: 1.2 mg/dL (ref 0.3–1.2)
Total Protein: 6.7 g/dL (ref 6.5–8.1)

## 2021-07-19 LAB — CBC
HCT: 40.2 % (ref 36.0–46.0)
Hemoglobin: 11.4 g/dL — ABNORMAL LOW (ref 12.0–15.0)
MCH: 24.7 pg — ABNORMAL LOW (ref 26.0–34.0)
MCHC: 28.4 g/dL — ABNORMAL LOW (ref 30.0–36.0)
MCV: 87 fL (ref 80.0–100.0)
Platelets: 325 10*3/uL (ref 150–400)
RBC: 4.62 MIL/uL (ref 3.87–5.11)
RDW: 18 % — ABNORMAL HIGH (ref 11.5–15.5)
WBC: 11.6 10*3/uL — ABNORMAL HIGH (ref 4.0–10.5)
nRBC: 0 % (ref 0.0–0.2)

## 2021-07-19 LAB — MAGNESIUM: Magnesium: 2.2 mg/dL (ref 1.7–2.4)

## 2021-07-19 LAB — PHOSPHORUS: Phosphorus: 5.2 mg/dL — ABNORMAL HIGH (ref 2.5–4.6)

## 2021-07-19 MED ORDER — ATORVASTATIN CALCIUM 20 MG PO TABS
20.0000 mg | ORAL_TABLET | Freq: Every day | ORAL | Status: DC
  Administered 2021-07-21 – 2021-07-28 (×8): 20 mg via ORAL
  Filled 2021-07-19 (×8): qty 1

## 2021-07-19 NOTE — Evaluation (Addendum)
Occupational Therapy Evaluation Patient Details Name: Madison Mcbride MRN: [id number] DOB: 1956-06-29 Today's Date: 07/19/2021   History of Present Illness Madison Mcbride is a 67 y.o. female with medical history significant for morbid obesity, OSA on CPAP, heart failure, depression, anxiety, hyperlipidemia, neuropathy, hypothyroid, non-insulin-dependent diabetes mellitus, who presents emergency department for chief concerns of shortness of breath.   Clinical Impression   Chart reviewed, RN cleared pt for participation in OT evaluation  Cotx with PT due to pt physical assist required.  Pt alert and oriented to self, grossly to situation and time. Pt required frequent multi modal cueing for task participation. Pt required MOD A x2 for bed mobility, MIN-MODA x2 for STS. Unable to take side steps up the bed on this date. Pt requires MAX A for LB dressing and for toileting. Pt with difficulties reaching to walker and grasping with R hand, reports ongoing RUE impairments prior to admission. Pt appears to be performing ADL below PLOF, presents with generalized weakness, endurance, affecting functional task completion. Pt will benefit from skilled OT to address functional deficits and to improve independence and safety in ADL completion. Of note Pt on 3L O2 via [place] during session, down to mid 80s during mobility, recovered with rest, up to 5L O2. Pt >90% on 3L o2 when evaluation completed. OT recommends discharge to STR at this time. Pt is left as received, NAD, all needs met. OT will continue to follow.      Recommendations for follow up therapy are one component of a multi-disciplinary discharge planning process, led by the attending physician.  Recommendations may be updated based on patient status, additional functional criteria and insurance authorization.   Follow Up Recommendations  Skilled nursing-short term rehab (<3 hours/day)    Assistance Recommended at Discharge Frequent or constant  Supervision/Assistance  Patient can return home with the following Two people to help with walking and/or transfers;A lot of help with bathing/dressing/bathroom    Functional Status Assessment  Patient has had a recent decline in their functional status and demonstrates the ability to make significant improvements in function in a reasonable and predictable amount of time.  Equipment Recommendations   (per next venue of care)    Recommendations for Other Services       Precautions / Restrictions Precautions Precautions: Fall Restrictions Weight Bearing Restrictions: No      Mobility Bed Mobility Overal bed mobility: Needs Assistance Bed Mobility: Supine to Sit;Sit to Supine     Supine to sit: Mod assist;HOB elevated;+2 for physical assistance;+2 for safety/equipment Sit to supine: Mod assist;+2 for physical assistance;+2 for safety/equipment        Transfers Overall transfer level: Needs assistance Equipment used: Rolling walker (2 wheels) Transfers: Sit to/from Stand Sit to Stand: Min assist;+2 physical assistance;+2 safety/equipment;Mod assist                  Balance Overall balance assessment: Needs assistance Sitting-balance support: Feet supported Sitting balance-Leahy Scale: Fair Sitting balance - Comments: requires close supervision   Standing balance support: Bilateral upper extremity supported;During functional activity;Reliant on assistive device for balance Standing balance-Leahy Scale: Poor Standing balance comment: requires +2 for safety and RW                           ADL either performed or assessed with clinical judgement   ADL Overall ADL's : Needs assistance/impaired  Lower Body Dressing: Maximal assistance       Toileting- Clothing Manipulation and Hygiene: Maximal assistance       Functional mobility during ADLs: Moderate assistance;+2 for physical assistance;+2 for  safety/equipment General ADL Comments: MOD A for bed mobility, MIN-MOD A STS, unable to take steps up the bed     Vision Patient Visual Report: No change from baseline       Perception     Praxis      Pertinent Vitals/Pain Pain Assessment: No/denies pain     Hand Dominance     Extremity/Trunk Assessment Upper Extremity Assessment Upper Extremity Assessment: RUE deficits/detail;Generalized weakness RUE Deficits / Details: RUE shoulder flexion to approx 90 degrees, full AROM in elbow and wrist; Pt with impaired FMC/dexterity throughout R hand with weak grip strength   Lower Extremity Assessment Lower Extremity Assessment: Generalized weakness   Cervical / Trunk Assessment Cervical / Trunk Assessment: Normal   Communication Communication Communication: No difficulties   Cognition Arousal/Alertness: Lethargic Behavior During Therapy: WFL for tasks assessed/performed Overall Cognitive Status: Impaired/Different from baseline Area of Impairment: Following commands;Safety/judgement;Awareness;Problem solving                       Following Commands: Follows one step commands with increased time Safety/Judgement: Decreased awareness of safety;Decreased awareness of deficits Awareness: Intellectual Problem Solving: Slow processing;Requires verbal cues;Requires tactile cues;Difficulty sequencing;Decreased initiation General Comments: alert and oriented to self, place, grosslyto time and to situation     General Comments       Exercises     Shoulder Instructions      Home Living Family/patient expects to be discharged to:: Private residence Living Arrangements: Spouse/significant other Available Help at Discharge: Family;Available 24 hours/day Type of Home: House Home Access: Stairs to enter [employer] of Steps: 5 Entrance Stairs-Rails: Right;Left;Can reach both Home Layout: One level     Bathroom Shower/Tub: Teacher, early years/pre:  Handicapped height Bathroom Accessibility: Yes   Home Equipment: Conservation officer, nature (2 wheels);Shower seat;Wheelchair - manual   Additional Comments: lift chair      Prior Functioning/Environment Prior Level of Function : Independent/Modified Independent             Mobility Comments: walker at baseline ADLs Comments: pt reports MOD I in ADL, is retired        OT Problem List: Decreased strength;Decreased activity tolerance;Decreased knowledge of precautions;Decreased safety awareness;Impaired balance (sitting and/or standing)      OT Treatment/Interventions: Self-care/ADL training;Balance training;Therapeutic exercise;DME and/or AE instruction;Patient/family education;Therapeutic activities    OT Goals(Current goals can be found in the care plan section) Acute Rehab OT Goals Patient Stated Goal: to get stronger OT Goal Formulation: With patient Time For Goal Achievement: 08/02/21 Potential to Achieve Goals: Good ADL Goals Pt Will Perform Grooming: with modified independence;sitting Pt Will Perform Upper Body Dressing: with modified independence;sitting Pt Will Perform Lower Body Dressing: with modified independence Pt Will Transfer to Toilet: with modified independence Pt Will Perform Toileting - Clothing Manipulation and hygiene: with modified independence;with adaptive equipment Pt/caregiver will Perform Home Exercise Program: Right Upper extremity;With written HEP provided  OT Frequency: Min 2X/week    Co-evaluation PT/OT/SLP Co-Evaluation/Treatment: Yes Reason for Co-Treatment: For patient/therapist safety;To address functional/ADL transfers PT goals addressed during session: Mobility/safety with mobility;Balance OT goals addressed during session: ADL's and self-care      AM-PAC OT "6 Clicks" Daily Activity     Outcome Measure Help from another person eating meals?: A Little Help  from another person taking care of personal grooming?: A Little Help from another  person toileting, which includes using toliet, bedpan, or urinal?: Total Help from another person bathing (including washing, rinsing, drying)?: A Lot Help from another person to put on and taking off regular upper body clothing?: A Lot Help from another person to put on and taking off regular lower body clothing?: A Lot 6 Click Score: 13   End of Session Equipment Utilized During Treatment: Gait belt;Rolling walker (2 wheels) Nurse Communication: Mobility status  Activity Tolerance: Patient limited by fatigue Patient left: in bed;with call bell/phone within reach;with bed alarm set;with nursing/sitter in room  OT Visit Diagnosis: Unsteadiness on feet (R26.81);Muscle weakness (generalized) (M62.81)                Time: 2824-1753 OT Time Calculation (min): 24 min Charges:  OT General Charges $OT Visit: 1 Visit OT Treatments $Self Care/Home Management : 8-22 mins  Shanon Payor, OTD OTR/L  07/19/21, 1:08 PM

## 2021-07-19 NOTE — Progress Notes (Addendum)
[facility]  PROGRESS NOTE  Madison Mcbride TKW:[id number] DOB: 11-25-1955 DOA: 07/15/2021 PCP: Rusty Aus, MD   Brief HPI:   66 year old female with medical history of morbid obesity, OSA on CPAP, CHF, depression, anxiety, hyperlipidemia, neuropathy, diabetes mellitus type 2 presented to the ED with complaints of shortness of breath.  As per patient she forgot to take her fluid medications for past 2 days.  She does not always wear her BiPAP machine at night.  Denies chest pain or nausea vomiting or diarrhea.  In the hospital patient requiring oxygen however at home she does not feel her oxygen. In the ED initially SPO2 was 85% on room air improved to 97% on 2 L of oxygen via nasal cannula.  Patient received furosemide 60 mg IV in the ED. ABG showed PCO2 of 76, pH 7.21.  Pulmonology was consulted, patient placed on BiPAP.  This morning patient is alert, oriented x3.  Denies shortness of breath.  Cardiology also was consulted for guidance regarding diuresis for CHF    Subjective   Reports feeling a good bit better. No confusion, breathing comfortably, no chest pain   Assessment/Plan:    Acute hypercarbic and hypoxemic respiratory failure HPEF Secondary to CHF exacerbation, OSA, OHS. Has been treated with bipap, now off that. Vq scan not suggestive of PE - cont bipap qhs - diuresis lasix 40 iv qd - cont [place] o2, wean as able  Debility - pt/ot consults pending, pt desires snf if qualifies  Acute kidney injury on CKD stage III Cr improving with diuresis, today 1.97 from baseline of around 1.3 - cont diuresis  Venous stasis dermatitis Physical exam today consistent with chronic venous stasis, no signs cellulitis - stop ceftriaxone - monitor  OSA -Continue BiPAP qhs -Pulmonology following  Hyperlipidemia -Continue atorvastatin 20 mg daily  Depression/anxiety -Continue bupropion  Restless leg syndrome -Continue ropinirole  Neuropathy -Continue Requip -  gabapentin on hold 2/2 episodes encephalopathy  Hyperkalemia Resolved - monitor  T2DM Daily fastings wnl - monitor   Medications     atorvastatin  20 mg Oral Q2200   buPROPion  150 mg Oral Daily   carvedilol  3.125 mg Oral BID WC   Chlorhexidine Gluconate Cloth  6 each Topical Daily   fluticasone  1 spray Each Nare Daily   furosemide  40 mg Intravenous Daily   gabapentin  300 mg Oral Q1200   heparin  5,000 Units Subcutaneous Q8H   levothyroxine  125 mcg Oral Daily   midodrine  2.5 mg Oral TID WC   montelukast  10 mg Oral Daily   pantoprazole  40 mg Oral Daily   rOPINIRole  3 mg Oral TID   sodium chloride flush  3 mL Intravenous Q12H     Data Reviewed:   CBG:  Recent Labs  Lab 07/18/21 0524  GLUCAP 89    SpO2: 91 % O2 Flow Rate (L/min): 5 L/min FiO2 (%): 40 %    Vitals:   07/19/21 0600 07/19/21 0700 07/19/21 0800 07/19/21 0900  BP: 134/72 127/71 131/73 139/84  Pulse: 62 (!) 56 (!) 55 (!) 59  Resp: 16 14 19  (!) 21  Temp:   98.6 F (37 C)   TempSrc:   Axillary   SpO2: 93% 95% 95% 91%  Weight:      Height:         Intake/Output Summary (Last 24 hours) at 07/19/2021 1007 Last data filed at 07/19/2021 0800 Gross per 24 hour  Intake --  Output 1575 ml  Net -1575 ml    01/05 1901 - 01/07 0700 In: -  Out: 1625 [JJOAC:1660]  Filed Weights   07/17/21 1857 07/18/21 0530 07/19/21 0312  Weight: (!) 154.1 kg (!) 157.7 kg (!) 156.4 kg    Data Reviewed: Basic Metabolic Panel: Recent Labs  Lab 07/15/21 2147 07/16/21 0712 07/17/21 0706 07/18/21 0535 07/19/21 0439  NA 137 137 138 138 139  K 6.0* 4.9 5.5* 4.8 4.5  CL 98 99 100 98 100  CO2 29 27 28 31 31   GLUCOSE 134* 95 86 85 90  BUN 38* 39* 48* 52* 51*  CREATININE 1.89* 1.76* 2.23* 2.16* 1.97*  CALCIUM 8.3* 8.0* 7.9* 8.0* 7.9*  MG  --   --   --   --  2.2  PHOS  --   --   --   --  5.2*   Liver Function Tests: Recent Labs  Lab 07/19/21 0439  AST 71*  ALT 68*  ALKPHOS 108  BILITOT 1.2  PROT  6.7  ALBUMIN 3.0*   No results for input(s): LIPASE, AMYLASE in the last 168 hours. No results for input(s): AMMONIA in the last 168 hours. CBC: Recent Labs  Lab 07/15/21 2147 07/16/21 0712 07/19/21 0439  WBC 10.5 10.1 11.6*  HGB 12.2 11.9* 11.4*  HCT 42.6 41.9 40.2  MCV 88.6 89.3 87.0  PLT 426* 378 325   Cardiac Enzymes: No results for input(s): CKTOTAL, CKMB, CKMBINDEX, TROPONINI in the last 168 hours. BNP (last 3 results) Recent Labs    03/14/21 1817 03/16/21 0819 07/15/21 2147  BNP 1,805.1* 1,352.0* 1,583.1*    ProBNP (last 3 results) No results for input(s): PROBNP in the last 8760 hours.  CBG: Recent Labs  Lab 07/18/21 0524  GLUCAP 89       Radiology Reports  NM Pulmonary Perfusion  Result Date: 07/18/2021 CLINICAL DATA:  Shortness of breath, history CHF EXAM: NUCLEAR MEDICINE PERFUSION LUNG SCAN TECHNIQUE: Perfusion images were obtained in multiple projections after intravenous injection of radiopharmaceutical. Lateral views could not be obtained due to body habitus. Ventilation scans intentionally deferred if perfusion scan and chest x-ray adequate for interpretation during COVID 19 epidemic. RADIOPHARMACEUTICALS:  4.46 mCi Tc-62m MAA IV COMPARISON:  04/15/2020 FINDINGS: Generally nonsegmental diminished perfusion in basilar portions of LEFT lower lobe. No wedge-shaped segmental or subsegmental perfusion defects identified. Chest radiograph demonstrates significant bibasilar pulmonary opacities and a RIGHT pleural effusion. IMPRESSION: Generally diminished perfusion in the basilar portions of the LEFT lower lobe in a nonsegmental pattern; no segmental or subsegmental perfusion defects identified to suggest pulmonary embolism. Electronically Signed   By: Lavonia Dana M.D.   On: 07/18/2021 15:00   DG Chest Port 1 View  Result Date: 07/19/2021 CLINICAL DATA:  66 year old female with shortness of breath. EXAM: PORTABLE CHEST 1 VIEW COMPARISON:  Portable chest  07/18/2021 and earlier. FINDINGS: Portable AP upright view at 0417 hours. Stable cardiomegaly and mediastinal contours. Continued somewhat low lung volumes. Diffuse indistinct appearance of the pulmonary vasculature and somewhat symmetric veiling lower lung opacity. No air bronchograms. No pneumothorax. Visualized tracheal air column is within normal limits. No acute osseous abnormality identified. IMPRESSION: Unchanged ventilation from yesterday with suspected pulmonary interstitial edema and small pleural effusions. Underlying cardiomegaly. Electronically Signed   By: Genevie Ann M.D.   On: 07/19/2021 04:56   DG Chest Port 1 View  Result Date: 07/18/2021 CLINICAL DATA:  Dyspnea EXAM: PORTABLE CHEST 1 VIEW COMPARISON:  Chest x-ray 07/15/2021 FINDINGS: Cardiomegaly and  mediastinum appear unchanged. Pulmonary vascular congestion with increasing opacities in the lower lung zones since previous study. No large pleural effusion appreciated. No pneumothorax. IMPRESSION: Cardiomegaly with increasing pulmonary vascular congestion and lower lung zone opacities which may represent edema. Electronically Signed   By: Ofilia Neas M.D.   On: 07/18/2021 10:06       Antibiotics: Anti-infectives (From admission, onward)    Start     Dose/Rate Route Frequency Ordered Stop   07/17/21 0900  cefTRIAXone (ROCEPHIN) 2 g in sodium chloride 0.9 % 100 mL IVPB        2 g 200 mL/hr over 30 Minutes Intravenous Every 24 hours 07/17/21 0850 07/24/21 0859         DVT prophylaxis:   Code Status: Full code  Family Communication: No family at bedside   Consultants:   Procedures:     Objective    Physical Examination:  General-appears in no acute distress Heart-S1-S2, regular, no murmur auscultated Lungs-decreased breath sounds bilaterally Abdomen-soft, nontender, no organomegaly Extremities-1+  edema in the lower extremities, venous stasis changes Neuro-alert, oriented x3, no focal deficit  noted  Status is: Inpatient  Dispo: The patient is from: Home              Anticipated d/c is to: snf              Anticipated d/c date is: tbd              Patient currently not stable for discharge  Barrier to discharge-ongoing evaluation and management for CHF exacerbation  COVID-19 Labs  Recent Labs    07/16/21 1842  DDIMER 4.18*    Lab Results  Component Value Date   SARSCOV2NAA NEGATIVE 07/15/2021   SARSCOV2NAA NEGATIVE 03/24/2021   [place] NEGATIVE 03/14/2021   [place] NEGATIVE 04/17/2020            Recent Results (from the past 240 hour(s))  Resp Panel by RT-PCR (Flu A&B, Covid) Nasopharyngeal Swab     Status: None   Collection Time: 07/15/21 10:29 PM   Specimen: Nasopharyngeal Swab; Nasopharyngeal(NP) swabs in vial transport medium  Result Value Ref Range Status   SARS Coronavirus 2 by RT PCR NEGATIVE NEGATIVE Final    Comment: (NOTE) SARS-CoV-2 target nucleic acids are NOT DETECTED.  The SARS-CoV-2 RNA is generally detectable in upper respiratory specimens during the acute phase of infection. The lowest concentration of SARS-CoV-2 viral copies this assay can detect is 138 copies/mL. A negative result does not preclude SARS-Cov-2 infection and should not be used as the sole basis for treatment or other patient management decisions. A negative result may occur with  improper specimen collection/handling, submission of specimen other than nasopharyngeal swab, presence of viral mutation(s) within the areas targeted by this assay, and inadequate number of viral copies(<138 copies/mL). A negative result must be combined with clinical observations, patient history, and epidemiological information. The expected result is Negative.  Fact Sheet for Patients:  EntrepreneurPulse.com.au  Fact Sheet for Healthcare Providers:  IncredibleEmployment.be  This test is no t yet approved or cleared by the [place] FDA  and  has been authorized for detection and/or diagnosis of SARS-CoV-2 by FDA under an Emergency Use Authorization (EUA). This EUA will remain  in effect (meaning this test can be used) for the duration of the COVID-19 declaration under Section 564(b)(1) of the Act, 21 U.S.C.section 360bbb-3(b)(1), unless the authorization is terminated  or revoked sooner.       Influenza A by PCR  NEGATIVE NEGATIVE Final   Influenza B by PCR NEGATIVE NEGATIVE Final    Comment: (NOTE) The Xpert Xpress SARS-CoV-2/FLU/RSV plus assay is intended as an aid in the diagnosis of influenza from Nasopharyngeal swab specimens and should not be used as a sole basis for treatment. Nasal washings and aspirates are unacceptable for Xpert Xpress SARS-CoV-2/FLU/RSV testing.  Fact Sheet for Patients: EntrepreneurPulse.com.au  Fact Sheet for Healthcare Providers: IncredibleEmployment.be  This test is not yet approved or cleared by the [place] FDA and has been authorized for detection and/or diagnosis of SARS-CoV-2 by FDA under an Emergency Use Authorization (EUA). This EUA will remain in effect (meaning this test can be used) for the duration of the COVID-19 declaration under Section 564(b)(1) of the Act, 21 U.S.C. section 360bbb-3(b)(1), unless the authorization is terminated or revoked.  Performed at [facility], [place], [place] [address]   Respiratory (~20 pathogens) panel by PCR     Status: None   Collection Time: 07/16/21  9:40 PM   Specimen: Nasopharyngeal Swab; Respiratory  Result Value Ref Range Status   Adenovirus NOT DETECTED NOT DETECTED Final   Coronavirus 229E NOT DETECTED NOT DETECTED Final    Comment: (NOTE) The Coronavirus on the Respiratory Panel, DOES NOT test for the novel  Coronavirus (2019 nCoV)    Coronavirus HKU1 NOT DETECTED NOT DETECTED Final   Coronavirus NL63 NOT DETECTED NOT DETECTED Final   Coronavirus OC43  NOT DETECTED NOT DETECTED Final   Metapneumovirus NOT DETECTED NOT DETECTED Final   Rhinovirus / Enterovirus NOT DETECTED NOT DETECTED Final   Influenza A NOT DETECTED NOT DETECTED Final   Influenza B NOT DETECTED NOT DETECTED Final   Parainfluenza Virus 1 NOT DETECTED NOT DETECTED Final   Parainfluenza Virus 2 NOT DETECTED NOT DETECTED Final   Parainfluenza Virus 3 NOT DETECTED NOT DETECTED Final   Parainfluenza Virus 4 NOT DETECTED NOT DETECTED Final   Respiratory Syncytial Virus NOT DETECTED NOT DETECTED Final   Bordetella pertussis NOT DETECTED NOT DETECTED Final   Bordetella Parapertussis NOT DETECTED NOT DETECTED Final   Chlamydophila pneumoniae NOT DETECTED NOT DETECTED Final   Mycoplasma pneumoniae NOT DETECTED NOT DETECTED Final    Comment: Performed at [facility], [place]. [address], [place], [place] [address]  MRSA Next Gen by PCR, Nasal     Status: None   Collection Time: 07/18/21  4:52 AM   Specimen: Nasal Mucosa; Nasal Swab  Result Value Ref Range Status   MRSA by PCR Next Gen NOT DETECTED NOT DETECTED Final    Comment: (NOTE) The GeneXpert MRSA Assay (FDA approved for NASAL specimens only), is one component of a comprehensive MRSA colonization surveillance program. It is not intended to diagnose MRSA infection nor to guide or monitor treatment for MRSA infections. Test performance is not FDA approved in patients less than 46 years old. Performed at [facility], [address], [place],  [address]     [facility] If 7PM-7AM, please contact night-coverage at [email or web address], Office  [phone]   07/19/2021, 10:07 AM  LOS: 2 days

## 2021-07-19 NOTE — Progress Notes (Signed)
CRITICAL CARE         Date: 07/19/2021,   MRN# [id number] Madison Mcbride 19-Feb-1956     AdmissionWeight: (!) 138.3 kg                 CurrentWeight: (!) 156.4 kg  Referring physician: Dr. 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  She also reported associated symptoms of cough which was dry and nonproductive however denied chest pain flulike illness nausea vomiting or diarrhea.  On arrival she was found to be acutely hypoxemic at 65% SPO2 but this did respond to supplemental oxygen and she had ABG with findings of pH 7.21 CO2 >70 with paO2 <50.  She was Covid negative on arrival.  Despite treatment for COPD she continues to exhibit signs of respiratory failure and is with lethargy and somnolence. PCCM consultation for futher evalaution and management.  07/19/21- patient has normal mental status today, continue BIPAP.  Reviewed plan with husband Madison Mcbride today.   PAST MEDICAL HISTORY   Past Medical History:  Diagnosis Date   CHF (congestive heart failure) ([place])    Diabetes mellitus without complication (HCC)    Hypothyroidism    Lymphedema    RLS (restless legs syndrome)    Sleep apnea    Thyroid cancer ([place]) 2007     SURGICAL HISTORY   Past Surgical History:  Procedure Laterality Date   ABDOMINAL HYSTERECTOMY     COLONOSCOPY WITH PROPOFOL N/A 08/26/2015   Procedure: COLONOSCOPY WITH PROPOFOL;  Surgeon: Manya Silvas, MD;  Location: [facility];  Service: Endoscopy;  Laterality: N/A;   THYROIDECTOMY        FAMILY HISTORY   Family History  Problem Relation Age of Onset   Breast cancer Neg Hx      SOCIAL HISTORY   Social History   Tobacco Use   Smoking status: Never   Smokeless tobacco: Never  Substance Use Topics   Alcohol use: No   Drug use: No     MEDICATIONS    Home Medication:    Current Medication:  Current Facility-Administered Medications:    0.9 %  sodium chloride infusion, 250 mL, Intravenous, PRN, Cox, Amy N, DO   acetaminophen (TYLENOL) tablet 650 mg, 650 mg, Oral, Q4H PRN, Cox, Amy N, DO, 650 mg at 07/18/21 1937   atorvastatin (LIPITOR) tablet 20 mg, 20 mg, Oral, Q2200, Cox, Amy N, DO, 20 mg at 07/18/21 2119   buPROPion (WELLBUTRIN XL) 24 hr tablet 150 mg, 150 mg, Oral, Daily, Cox, Amy N, DO, 150 mg at 07/19/21 0827   carvedilol (COREG) tablet 3.125 mg, 3.125 mg, Oral, BID WC, Tang, Lily Michelle, PA-C, 3.125 mg at 07/19/21 4403   Chlorhexidine Gluconate Cloth 2 % PADS 6 each, 6 each, Topical, Daily, Ouma, Bing Neighbors, NP   fluticasone (FLONASE) 50 MCG/ACT nasal spray 1 spray, 1 spray, Each Nare, Daily, Darrick Meigs, Marge Duncans, MD, 1 spray at 07/19/21 0827   furosemide (LASIX) injection 40 mg, 40 mg, Intravenous, Daily, Tang, Alanson Puls, PA-C, 40 mg at 07/19/21 [phone]  gabapentin (NEURONTIN) capsule 300 mg, 300 mg, Oral, Q1200, Cox, Amy N, DO, 300 mg at 07/19/21 1435   heparin injection 5,000 Units, 5,000 Units, Subcutaneous, Q8H, Cox, Amy N, DO, 5,000 Units at 07/19/21 1436   levothyroxine (SYNTHROID) tablet 125 mcg, 125 mcg, Oral, Daily, Cox, Amy N, DO, 125 mcg at 07/19/21 0505   midodrine (PROAMATINE) tablet 2.5 mg, 2.5 mg, Oral, TID WC, Desire Fulp, MD, 2.5 mg at 07/19/21 1435   montelukast (SINGULAIR) tablet 10 mg, 10 mg, Oral, Daily, Cox, Amy N, DO, 10 mg at 07/19/21 0826   ondansetron (ZOFRAN) injection 4 mg, 4 mg, Intravenous, Q6H PRN, Cox, Amy N, DO, 4 mg at 07/16/21 0414   pantoprazole (PROTONIX) EC tablet 40 mg, 40 mg, Oral,  Daily, Cox, Amy N, DO, 40 mg at 07/19/21 0827   rOPINIRole (REQUIP) tablet 3 mg, 3 mg, Oral, TID, Cox, Amy N, DO, 3 mg at 07/19/21 0827   sodium chloride flush (NS) 0.9 % injection 3 mL, 3 mL, Intravenous, Q12H, Cox, Amy N, DO, 3 mL at 07/19/21 0848   sodium chloride flush (NS) 0.9 % injection 3 mL, 3 mL, Intravenous, PRN, Cox, Amy N, DO, 3 mL at 07/17/21 8101    ALLERGIES   Patient has no known allergies.     REVIEW OF SYSTEMS    Review of Systems:  Gen:  Denies  fever, sweats, chills weigh loss  HEENT: Denies blurred vision, double vision, ear pain, eye pain, hearing loss, nose bleeds, sore throat Cardiac:  No dizziness, chest pain or heaviness, chest tightness,edema Resp:   reports cough, shortness of breath,wheezing, deinies hemoptysis,  Gi: Denies swallowing difficulty, stomach pain, nausea or vomiting, diarrhea, constipation, bowel incontinence Gu:  Denies bladder incontinence, burning urine Ext:   Denies Joint pain, stiffness or swelling Skin: Denies  skin rash, easy bruising or bleeding or hives Endoc:  Denies polyuria, polydipsia , polyphagia or weight change Psych:   Denies depression, insomnia or hallucinations   Other:  All other systems negative   VS: BP (!) 117/55    Pulse (!) 51    Temp 98 F (36.7 C) (Axillary)    Resp 20    Ht 5\' 5"  (1.651 m)    Wt (!) 156.4 kg    SpO2 90%    BMI 57.38 kg/m      PHYSICAL EXAM    GENERAL:Obese somnolent mild distress no fevers, chills,  HEAD: Normocephalic, atraumatic.  EYES: Pupils equal, round, reactive to light. Extraocular muscles intact. No scleral icterus.  MOUTH: dry mucosal membrane. Dentition intact. No abscess noted.  EAR, NOSE, THROAT: Clear without exudates. No external lesions.  NECK: Supple. No thyromegaly. No nodules. No JVD.  PULMONARY: Decreased breath sounds bilaterally without wheezing or rhonchorous lung sounds CARDIOVASCULAR: S1 and S2. Regular rate and rhythm. No murmurs, rubs, or gallops. No  edema. Pedal pulses 2+ bilaterally.  GASTROINTESTINAL: Soft, nontender, nondistended. No masses. Positive bowel sounds. No hepatosplenomegaly.  Obese abdomen MUSCULOSKELETAL: No swelling, clubbing, or +2 edema. Range of motion full in all extremities.  NEUROLOGIC:GCS9  SKIN: No ulceration, lesions, rashes, or cyanosis. Skin warm and dry. Turgor intact.  PSYCHIATRIC: Mood, affect is flat      IMAGING    DG Chest 2 View  Result Date: 07/15/2021 CLINICAL DATA:  Dyspnea EXAM: CHEST - 2 VIEW COMPARISON:  03/14/2021 FINDINGS: Bibasilar opacification likely relates to overlying soft tissue. Lungs are clear. No pneumothorax or pleural effusion. Mild cardiomegaly is stable. Central pulmonary arterial enlargement is  again noted in keeping with changes of pulmonary arterial hypertension. IMPRESSION: Stable cardiomegaly. Morphologic changes in keeping with pulmonary arterial hypertension. Electronically Signed   By: Fidela Salisbury M.D.   On: 07/15/2021 22:15   NM Pulmonary Perfusion  Result Date: 07/18/2021 CLINICAL DATA:  Shortness of breath, history CHF EXAM: NUCLEAR MEDICINE PERFUSION LUNG SCAN TECHNIQUE: Perfusion images were obtained in multiple projections after intravenous injection of radiopharmaceutical. Lateral views could not be obtained due to body habitus. Ventilation scans intentionally deferred if perfusion scan and chest x-ray adequate for interpretation during COVID 19 epidemic. RADIOPHARMACEUTICALS:  4.46 mCi Tc-71m MAA IV COMPARISON:  04/15/2020 FINDINGS: Generally nonsegmental diminished perfusion in basilar portions of LEFT lower lobe. No wedge-shaped segmental or subsegmental perfusion defects identified. Chest radiograph demonstrates significant bibasilar pulmonary opacities and a RIGHT pleural effusion. IMPRESSION: Generally diminished perfusion in the basilar portions of the LEFT lower lobe in a nonsegmental pattern; no segmental or subsegmental perfusion defects identified to suggest  pulmonary embolism. Electronically Signed   By: Lavonia Dana M.D.   On: 07/18/2021 15:00   DG Chest Port 1 View  Result Date: 07/19/2021 CLINICAL DATA:  66 year old female with shortness of breath. EXAM: PORTABLE CHEST 1 VIEW COMPARISON:  Portable chest 07/18/2021 and earlier. FINDINGS: Portable AP upright view at 0417 hours. Stable cardiomegaly and mediastinal contours. Continued somewhat low lung volumes. Diffuse indistinct appearance of the pulmonary vasculature and somewhat symmetric veiling lower lung opacity. No air bronchograms. No pneumothorax. Visualized tracheal air column is within normal limits. No acute osseous abnormality identified. IMPRESSION: Unchanged ventilation from yesterday with suspected pulmonary interstitial edema and small pleural effusions. Underlying cardiomegaly. Electronically Signed   By: Genevie Ann M.D.   On: 07/19/2021 04:56   DG Chest Port 1 View  Result Date: 07/18/2021 CLINICAL DATA:  Dyspnea EXAM: PORTABLE CHEST 1 VIEW COMPARISON:  Chest x-ray 07/15/2021 FINDINGS: Cardiomegaly and mediastinum appear unchanged. Pulmonary vascular congestion with increasing opacities in the lower lung zones since previous study. No large pleural effusion appreciated. No pneumothorax. IMPRESSION: Cardiomegaly with increasing pulmonary vascular congestion and lower lung zone opacities which may represent edema. Electronically Signed   By: Ofilia Neas M.D.   On: 07/18/2021 10:06       ASSESSMENT/PLAN   Acute on chronic hypoxemic and hypercapnic respiratory failure -Likely due to thoracic restriction with obesity hypoventilation syndrome and obstructive sleep apnea overlap -Would like to perform spirometry with graph to evaluate residual lung function and stratification of ventilatory defect -Arterial blood gas to allow patient for BiPAP while here and qualification for home use -Consultation with [employer] for medical equipment and supplies  -Patient would benefit from bariatric  evaluation -Complicated by fluid shifts secondary to heart failure and renal impairment -Would benefit from occupational and physical therapy -D-dimer to rule out pulmonary venous thromboembolism-is very high will check DVT study -RD nutritional evaluation for appropriate dietary changes with morbid obesity , hypothyroid and DM2   Severe hyperkalemia   kayexalate x 1  Pharmacy consult Lasix delivered x 2           -continue diuresis and BIPAP   Acute on chronic diastolic CHF  Continue BIPAP and lasix with albumin    GI/DVT ppx Protonix /heparin   Critical care provider statement:   Total critical care time: 33 minutes   Performed by: Lanney Gins MD   Critical care time was exclusive of separately billable procedures and treating other patients.   Critical care was necessary to treat or prevent imminent or  life-threatening deterioration.   Critical care was time spent personally by me on the following activities: development of treatment plan with patient and/or surrogate as well as nursing, discussions with consultants, evaluation of patient's response to treatment, examination of patient, obtaining history from patient or surrogate, ordering and performing treatments and interventions, ordering and review of laboratory studies, ordering and review of radiographic studies, pulse oximetry and re-evaluation of patient's condition.    Ottie Glazier, M.D.  Pulmonary & Critical Care Medicine        Ottie Glazier, M.D.  Division of [place]

## 2021-07-19 NOTE — Evaluation (Signed)
Physical Therapy Evaluation Patient Details Name: Madison Mcbride MRN: [id number] DOB: [birth year] Today's Date: 07/19/2021  History of Present Illness  Madison Mcbride is a 66 y.o. female with medical history significant for morbid obesity, OSA on CPAP, heart failure, depression, anxiety, hyperlipidemia, neuropathy, hypothyroid, non-insulin-dependent diabetes mellitus, who presents emergency department for chief concerns of shortness of breath.   Clinical Impression  Patient received in bed, agrees to PT/OT session. Patient required mod +2 for bed mobility. Mod +2 for sit to stand and patient is unable to take steps this session. Continues to be somewhat groggy, requiring verbal and tactile cues during session. O2 saturations dropping to mid 80%s with mobility on 3 liters. Cues for PLB. Patient tends to hold breath with mobility. She will continue to benefit from skilled PT while here to improve strength, independence and safety with mobility.          Recommendations for follow up therapy are one component of a multi-disciplinary discharge planning process, led by the attending physician.  Recommendations may be updated based on patient status, additional functional criteria and insurance authorization.  Follow Up Recommendations Skilled nursing-short term rehab (<3 hours/day)    Assistance Recommended at Discharge Frequent or constant Supervision/Assistance  Patient can return home with the following  Two people to help with walking and/or transfers;A lot of help with bathing/dressing/bathroom;Assistance with cooking/housework    Equipment Recommendations None recommended by PT  Recommendations for Other Services       Functional Status Assessment Patient has had a recent decline in their functional status and demonstrates the ability to make significant improvements in function in a reasonable and predictable amount of time.     Precautions / Restrictions  Precautions Precautions: Fall Restrictions Weight Bearing Restrictions: No      Mobility  Bed Mobility Overal bed mobility: Needs Assistance Bed Mobility: Supine to Sit;Sit to Supine     Supine to sit: Mod assist;HOB elevated;+2 for physical assistance;+2 for safety/equipment Sit to supine: Mod assist;+2 for physical assistance;+2 for safety/equipment        Transfers Overall transfer level: Needs assistance Equipment used: Rolling walker (2 wheels) Transfers: Sit to/from Stand Sit to Stand: Min assist;+2 physical assistance;+2 safety/equipment                Ambulation/Gait               General Gait Details: unable to effectively take steps this session, O2 dropping to 86% with mobility. Patient tends to hold breath and turn red with mobility despite cues.  Stairs            Wheelchair Mobility    Modified Rankin (Stroke Patients Only)       Balance Overall balance assessment: Needs assistance Sitting-balance support: Feet supported Sitting balance-Leahy Scale: Fair Sitting balance - Comments: requires close supervision   Standing balance support: Bilateral upper extremity supported;During functional activity;Reliant on assistive device for balance Standing balance-Leahy Scale: Poor Standing balance comment: requires +2 for safety and RW                             Pertinent Vitals/Pain Pain Assessment: No/denies pain    Home Living Family/patient expects to be discharged to:: Private residence Living Arrangements: Spouse/significant other Available Help at Discharge: Family;Available 24 hours/day Type of Home: House Home Access: Stairs to enter Entrance Stairs-Rails: Right;Left;Can reach both Entrance Stairs-Number of Steps: 5   Home Layout: One level Home  Equipment: Conservation officer, nature (2 wheels);Shower seat;Wheelchair - manual Additional Comments: lift chair    Prior Function Prior Level of Function : Independent/Modified  Independent             Mobility Comments: uses walker at baseline       Hand Dominance        Extremity/Trunk Assessment   Upper Extremity Assessment Upper Extremity Assessment: Defer to OT evaluation    Lower Extremity Assessment Lower Extremity Assessment: Generalized weakness    Cervical / Trunk Assessment Cervical / Trunk Assessment: Normal  Communication   Communication: No difficulties  Cognition Arousal/Alertness: Lethargic Behavior During Therapy: WFL for tasks assessed/performed Overall Cognitive Status: Impaired/Different from baseline Area of Impairment: Orientation;Following commands;Safety/judgement;Awareness;Problem solving                       Following Commands: Follows one step commands with increased time Safety/Judgement: Decreased awareness of safety;Decreased awareness of deficits Awareness: Intellectual Problem Solving: Slow processing;Requires verbal cues;Requires tactile cues;Difficulty sequencing;Decreased initiation          General Comments      Exercises     Assessment/Plan    PT Assessment Patient needs continued PT services  PT Problem List Decreased strength;Decreased mobility;Decreased activity tolerance;Decreased balance;Decreased cognition;Cardiopulmonary status limiting activity;Decreased safety awareness;Decreased coordination;Obesity       PT Treatment Interventions DME instruction;Therapeutic activities;Gait training;Therapeutic exercise;Patient/family education;Functional mobility training;Balance training    PT Goals (Current goals can be found in the Care Plan section)  Acute Rehab PT Goals Patient Stated Goal: to return home PT Goal Formulation: With patient Time For Goal Achievement: 08/02/21 Potential to Achieve Goals: Fair    Frequency Min 2X/week     Co-evaluation PT/OT/SLP Co-Evaluation/Treatment: Yes Reason for Co-Treatment: For patient/therapist safety;To address functional/ADL  transfers PT goals addressed during session: Mobility/safety with mobility;Balance         AM-PAC PT "6 Clicks" Mobility  Outcome Measure Help needed turning from your back to your side while in a flat bed without using bedrails?: A Lot Help needed moving from lying on your back to sitting on the side of a flat bed without using bedrails?: A Lot Help needed moving to and from a bed to a chair (including a wheelchair)?: A Lot Help needed standing up from a chair using your arms (e.g., wheelchair or bedside chair)?: A Lot Help needed to walk in hospital room?: Total Help needed climbing 3-5 steps with a railing? : Total 6 Click Score: 10    End of Session Equipment Utilized During Treatment: Gait belt;Oxygen Activity Tolerance: Patient limited by lethargy;Treatment limited secondary to medical complications (Comment) (O2 dropping with activity) Patient left: in bed;with call bell/phone within reach;with family/visitor present Nurse Communication: Mobility status PT Visit Diagnosis: Muscle weakness (generalized) (M62.81);Other abnormalities of gait and mobility (R26.89);Unsteadiness on feet (R26.81)    Time: 7564-3329 PT Time Calculation (min) (ACUTE ONLY): 24 min   Charges:   PT Evaluation $PT Eval Moderate Complexity: 1 Mod          Blanche Scovell, PT, GCS 07/19/21,12:33 PM

## 2021-07-19 NOTE — Progress Notes (Signed)
[facility]  SUBJECTIVE: Patient laying in bed, reports feeling better, with less shortness of breath, on nasal cannula   Vitals:   07/19/21 0600 07/19/21 0700 07/19/21 0800 07/19/21 0900  BP: 134/72 127/71 131/73 139/84  Pulse: 62 (!) 56 (!) 55 (!) 59  Resp: 16 14 19  (!) 21  Temp:   98.6 F (37 C)   TempSrc:   Axillary   SpO2: 93% 95% 95% 91%  Weight:      Height:         Intake/Output Summary (Last 24 hours) at 07/19/2021 1013 Last data filed at 07/19/2021 0800 Gross per 24 hour  Intake --  Output 1575 ml  Net -1575 ml      PHYSICAL EXAM  General: Well developed, well nourished, in no acute distress HEENT:  Normocephalic and atramatic Neck:  No JVD.  Lungs: Clear bilaterally to auscultation and percussion. Heart: HRRR . Normal S1 and S2 without gallops or murmurs.  Abdomen: Bowel sounds are positive, abdomen soft and non-tender  Msk:  Back normal, normal gait. Normal strength and tone for age. Extremities: No clubbing, cyanosis or edema.   Neuro: Alert and oriented X 3. Psych:  Good affect, responds appropriately   LABS: Basic Metabolic Panel: Recent Labs    07/18/21 0535 07/19/21 0439  NA 138 139  K 4.8 4.5  CL 98 100  CO2 31 31  GLUCOSE 85 90  BUN 52* 51*  CREATININE 2.16* 1.97*  CALCIUM 8.0* 7.9*  MG  --  2.2  PHOS  --  5.2*   Liver Function Tests: Recent Labs    07/19/21 0439  AST 71*  ALT 68*  ALKPHOS 108  BILITOT 1.2  PROT 6.7  ALBUMIN 3.0*   No results for input(s): LIPASE, AMYLASE in the last 72 hours. CBC: Recent Labs    07/19/21 0439  WBC 11.6*  HGB 11.4*  HCT 40.2  MCV 87.0  PLT 325   Cardiac Enzymes: No results for input(s): CKTOTAL, CKMB, CKMBINDEX, TROPONINI in the last 72 hours. BNP: Invalid input(s): POCBNP D-Dimer: Recent Labs    07/16/21 1842  DDIMER 4.18*   Hemoglobin A1C: No results for input(s): HGBA1C in the last 72 hours. Fasting Lipid Panel: No results for input(s): CHOL, HDL, LDLCALC, TRIG,  CHOLHDL, LDLDIRECT in the last 72 hours. Thyroid Function Tests: No results for input(s): TSH, T4TOTAL, T3FREE, THYROIDAB in the last 72 hours.  Invalid input(s): FREET3 Anemia Panel: No results for input(s): VITAMINB12, FOLATE, FERRITIN, TIBC, IRON, RETICCTPCT in the last 72 hours.  NM Pulmonary Perfusion  Result Date: 07/18/2021 CLINICAL DATA:  Shortness of breath, history CHF EXAM: NUCLEAR MEDICINE PERFUSION LUNG SCAN TECHNIQUE: Perfusion images were obtained in multiple projections after intravenous injection of radiopharmaceutical. Lateral views could not be obtained due to body habitus. Ventilation scans intentionally deferred if perfusion scan and chest x-ray adequate for interpretation during COVID 19 epidemic. RADIOPHARMACEUTICALS:  4.46 mCi Tc-82m MAA IV COMPARISON:  04/15/2020 FINDINGS: Generally nonsegmental diminished perfusion in basilar portions of LEFT lower lobe. No wedge-shaped segmental or subsegmental perfusion defects identified. Chest radiograph demonstrates significant bibasilar pulmonary opacities and a RIGHT pleural effusion. IMPRESSION: Generally diminished perfusion in the basilar portions of the LEFT lower lobe in a nonsegmental pattern; no segmental or subsegmental perfusion defects identified to suggest pulmonary embolism. Electronically Signed   By: Lavonia Dana M.D.   On: 07/18/2021 15:00   DG Chest Port 1 View  Result Date: 07/19/2021 CLINICAL DATA:  66 year old female with shortness of  breath. EXAM: PORTABLE CHEST 1 VIEW COMPARISON:  Portable chest 07/18/2021 and earlier. FINDINGS: Portable AP upright view at 0417 hours. Stable cardiomegaly and mediastinal contours. Continued somewhat low lung volumes. Diffuse indistinct appearance of the pulmonary vasculature and somewhat symmetric veiling lower lung opacity. No air bronchograms. No pneumothorax. Visualized tracheal air column is within normal limits. No acute osseous abnormality identified. IMPRESSION: Unchanged  ventilation from yesterday with suspected pulmonary interstitial edema and small pleural effusions. Underlying cardiomegaly. Electronically Signed   By: Genevie Ann M.D.   On: 07/19/2021 04:56   DG Chest Port 1 View  Result Date: 07/18/2021 CLINICAL DATA:  Dyspnea EXAM: PORTABLE CHEST 1 VIEW COMPARISON:  Chest x-ray 07/15/2021 FINDINGS: Cardiomegaly and mediastinum appear unchanged. Pulmonary vascular congestion with increasing opacities in the lower lung zones since previous study. No large pleural effusion appreciated. No pneumothorax. IMPRESSION: Cardiomegaly with increasing pulmonary vascular congestion and lower lung zone opacities which may represent edema. Electronically Signed   By: Ofilia Neas M.D.   On: 07/18/2021 10:06     Echo LVEF 45 to 50% 03/16/2021  TELEMETRY: Sinus rhythm:  ASSESSMENT AND PLAN:  Principal Problem:   Acute exacerbation of CHF (congestive heart failure) (HCC) Active Problems:   Hypothyroidism   Morbid obesity with BMI of 50.0-59.9, adult (HCC)   Acute respiratory failure with hypoxia and hypercapnia (HCC)   HLD (hyperlipidemia)   Depression   OSA (obstructive sleep apnea)   Acute hypoxemic respiratory failure (HCC)   Benign essential hypertension   Stage 3a chronic kidney disease ([place])    1.  Chronic diastolic congestive heart failure, HFpEF (LVEF 40-50% on 03/16/2021), on furosemide 40 mg IV daily and carvedilol 3.125 mg twice daily 2.  Acute hypercarbic and hypoxemic respiratory failure, obstructive sleep apnea on CPAP, clinically improved 3.  Essential hypertension, blood pressure overall low normal on current medications 4.  Elevated D-dimer, no evidence for pulmonary embolism by VQ scan 5.  Bilateral lower extremity cellulitis, improving on ceftriaxone  Recommendations  1.  Agree with current therapy 2.  Continue diuresis 3.  Carefully monitor renal status 4.  Continue carvedilol 3.125 mg twice daily 5.  No further cardiac diagnostics at this  time   Isaias Cowman, MD, PhD, [facility] 07/19/2021 10:13 AM

## 2021-07-19 NOTE — Progress Notes (Signed)
Patient alert and oriented x4, Sinus Brady/NSR, on 2-3L via [place] sat in 90s. Patient to wear Bipap at night.

## 2021-07-20 ENCOUNTER — Inpatient Hospital Stay: Payer: Medicare Other

## 2021-07-20 LAB — BASIC METABOLIC PANEL
Anion gap: 8 (ref 5–15)
BUN: 45 mg/dL — ABNORMAL HIGH (ref 8–23)
CO2: 32 mmol/L (ref 22–32)
Calcium: 8.2 mg/dL — ABNORMAL LOW (ref 8.9–10.3)
Chloride: 99 mmol/L (ref 98–111)
Creatinine, Ser: 1.52 mg/dL — ABNORMAL HIGH (ref 0.44–1.00)
GFR, Estimated: 37 mL/min — ABNORMAL LOW (ref >60)
Glucose, Bld: 81 mg/dL (ref 70–99)
Potassium: 4.4 mmol/L (ref 3.5–5.1)
Sodium: 139 mmol/L (ref 135–145)

## 2021-07-20 LAB — CBC
HCT: 37.8 % (ref 36.0–46.0)
Hemoglobin: 10.9 g/dL — ABNORMAL LOW (ref 12.0–15.0)
MCH: 25.2 pg — ABNORMAL LOW (ref 26.0–34.0)
MCHC: 28.8 g/dL — ABNORMAL LOW (ref 30.0–36.0)
MCV: 87.5 fL (ref 80.0–100.0)
Platelets: 318 10*3/uL (ref 150–400)
RBC: 4.32 MIL/uL (ref 3.87–5.11)
RDW: 18 % — ABNORMAL HIGH (ref 11.5–15.5)
WBC: 9.5 10*3/uL (ref 4.0–10.5)
nRBC: 0 % (ref 0.0–0.2)

## 2021-07-20 LAB — BLOOD GAS, ARTERIAL
Acid-Base Excess: 9.2 mmol/L — ABNORMAL HIGH (ref 0.0–2.0)
Bicarbonate: 36.4 mmol/L — ABNORMAL HIGH (ref 20.0–28.0)
Delivery systems: POSITIVE
Expiratory PAP: 8
FIO2: 0.3
MECHVT: 450 mL
O2 Saturation: 94.8 %
Patient temperature: 37
RATE: 24 resp/min
pCO2 arterial: 63 mmHg — ABNORMAL HIGH (ref 32.0–48.0)
pH, Arterial: 7.37 (ref 7.350–7.450)
pO2, Arterial: 77 mmHg — ABNORMAL LOW (ref 83.0–108.0)

## 2021-07-20 LAB — MAGNESIUM: Magnesium: 2.3 mg/dL (ref 1.7–2.4)

## 2021-07-20 LAB — PHOSPHORUS: Phosphorus: 4.2 mg/dL (ref 2.5–4.6)

## 2021-07-20 MED ORDER — HYDROCERIN EX CREA
TOPICAL_CREAM | Freq: Every day | CUTANEOUS | Status: DC
  Administered 2021-07-20: 1 via TOPICAL
  Filled 2021-07-20: qty 113

## 2021-07-20 NOTE — TOC Initial Note (Signed)
Transition of Care [facility]) - Initial/Assessment Note    Patient Details  Name: Madison Mcbride MRN: [id number] Date of Birth: [birth year]  Transition of Care [facility]) CM/SW Contact:    Janyth Contes, [place] Phone Number: 07/20/2021, 4:35 PM  Clinical Narrative:                  Patient presents to [facility] due to SOB. Patient was unable to speak to CSW due to CPAP machine. CSW spoke to husband about SNF placement preferences and he stated [facility]. Patients spouse refuses [facility].  Patient Spouse Contact:  Leveille,Benjamin Spouse [phone]    Expected Discharge Plan: [place] Barriers to Discharge: Continued Medical Work up, SNF Pending bed offer   Patient Goals and CMS Choice Patient states their goals for this hospitalization and ongoing recovery are:: Patient wants to go home.      Expected Discharge Plan and Services Expected Discharge Plan: [place] In-house Referral: Clinical Social Work   Post Acute Care Choice: [place] arrangements for the past 2 months: [place]                                      Prior Living Arrangements/Services Living arrangements for the past 2 months: [place] with:: Spouse (Martinec,Benjamin Spouse [phone]) Patient language and need for interpreter reviewed:: Yes Do you feel safe going back to the place where you live?: Yes      Need for Family Participation in Patient Care: Yes (Comment) Care giver support system in place?: Yes (comment)   Criminal Activity/Legal Involvement Pertinent to Current Situation/Hospitalization: No - Comment as needed  Activities of Daily Living      Permission Sought/Granted Permission sought to share information with : Family Supports Permission granted to share information with : Yes, Verbal Permission Granted  Share Information with NAME: Cumberland,Benjamin Spouse [phone]           Emotional  Assessment Appearance:: Appears stated age Attitude/Demeanor/Rapport: Unable to Assess Affect (typically observed): Unable to Assess Orientation: : Oriented to Self, Oriented to Place, Oriented to  Time, Oriented to Situation Alcohol / Substance Use: Not Applicable Psych Involvement: No (comment)  Admission diagnosis:  SOB (shortness of breath) [R06.02] Hypoxia [R09.02] Acute exacerbation of CHF (congestive heart failure) (HCC) [I50.9] Acute hypoxemic respiratory failure (HCC) [J96.01] Congestive heart failure, unspecified HF chronicity, unspecified heart failure type [facility]) [I50.9] Patient Active Problem List   Diagnosis Date Noted   Acute exacerbation of CHF (congestive heart failure) ([place]) 07/15/2021   Effusion of knee joint, left 03/22/2021   Acute on chronic systolic CHF (congestive heart failure) ([place]) 03/19/2021   Acute on chronic heart failure ([place]) 03/14/2021   Acute hypoxemic respiratory failure ([place]) 03/14/2021   Acute on chronic diastolic CHF (congestive heart failure) ([place]) 04/09/2020   Diabetes mellitus without complication ([place]) 07/15/7251   HLD (hyperlipidemia) 04/09/2020   Depression 04/09/2020   Elevated troponin 04/09/2020   OSA (obstructive sleep apnea) 04/09/2020   Peripheral polyneuropathy 02/29/2020   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   Lumbar radiculopathy, acute    Acute midline low back pain without sciatica    Type 2 diabetes mellitus with diabetic neuropathy, without long-term current use of insulin ([place]) 02/21/2020   Hypothyroidism 02/21/2020   Morbid obesity with BMI of 50.0-59.9, adult ([place]) 02/21/2020   Severe sepsis ([place]) 02/21/2020  CAP (community acquired pneumonia) 02/21/2020   Bilateral cellulitis of lower leg 02/21/2020   Acute respiratory failure with hypoxia and hypercapnia ([place]) 02/21/2020   Acute renal failure superimposed on stage 3a chronic kidney disease ([place]) 94/70/7615   Acute metabolic  encephalopathy 18/34/3735   Septic shock ([place]) 02/21/2020   Stage 3a chronic kidney disease ([place]) 01/27/2020   Transaminitis 01/27/2020   Lymphedema of both lower extremities 08/21/2016   Benign essential hypertension 05/18/2016   PCP:  Rusty Aus, MD Pharmacy:   [id number] [address], [place] [address] Phone: [phone] Fax: [phone]     Social Determinants of Health (SDOH) Interventions    Readmission Risk Interventions No flowsheet data found.

## 2021-07-20 NOTE — Progress Notes (Signed)
CRITICAL CARE         Date: 07/20/2021,   MRN# [id number] Dalayna Lauter Bodin 22-Apr-1956     AdmissionWeight: (!) 138.3 kg                 CurrentWeight: (!) 151.4 kg  Referring physician: Dr. 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  She also reported associated symptoms of cough which was dry and nonproductive however denied chest pain flulike illness nausea vomiting or diarrhea.  On arrival she was found to be acutely hypoxemic at 65% SPO2 but this did respond to supplemental oxygen and she had ABG with findings of pH 7.21 CO2 >70 with paO2 <50.  She was Covid negative on arrival.  Despite treatment for COPD she continues to exhibit signs of respiratory failure and is with lethargy and somnolence. PCCM consultation for futher evalaution and management.  07/19/21- patient has normal mental status today, continue BIPAP.  Reviewed plan with husband Maurina Fawaz today.   07/20/21- Patient improved, PCCM will sign off and available as needed. Had long discussion with patient regarding bariatric surgery to help reduce risk of death from hypoventilation.    PAST MEDICAL HISTORY   Past Medical History:  Diagnosis Date   CHF (congestive heart failure) ([place])    Diabetes mellitus without complication (HCC)    Hypothyroidism    Lymphedema    RLS (restless legs syndrome)    Sleep apnea    Thyroid cancer ([place]) 2007     SURGICAL HISTORY   Past Surgical History:  Procedure Laterality Date   ABDOMINAL HYSTERECTOMY     COLONOSCOPY  WITH PROPOFOL N/A 08/26/2015   Procedure: COLONOSCOPY WITH PROPOFOL;  Surgeon: Manya Silvas, MD;  Location: [facility];  Service: Endoscopy;  Laterality: N/A;   THYROIDECTOMY       FAMILY HISTORY   Family History  Problem Relation Age of Onset   Breast cancer Neg Hx      SOCIAL HISTORY   Social History   Tobacco Use   Smoking status: Never   Smokeless tobacco: Never  Substance Use Topics   Alcohol use: No   Drug use: No     MEDICATIONS    Home Medication:    Current Medication:  Current Facility-Administered Medications:    0.9 %  sodium chloride infusion, 250 mL, Intravenous, PRN, Cox, Amy N, DO   acetaminophen (TYLENOL) tablet 650 mg, 650 mg, Oral, Q4H PRN, Cox, Amy N, DO, 650 mg at 07/20/21 0140   atorvastatin (LIPITOR) tablet 20 mg, 20 mg, Oral, Q2200, Rust-Chester, Toribio Harbour L, NP   buPROPion (WELLBUTRIN XL) 24 hr tablet 150 mg, 150 mg, Oral, Daily, Cox, Amy N, DO, 150 mg at 07/19/21 0827   carvedilol (COREG) tablet 3.125 mg, 3.125 mg, Oral, BID WC, Tang, Lily Michelle, PA-C, 3.125 mg at 07/20/21 9381   Chlorhexidine Gluconate Cloth 2 % PADS 6 each, 6 each, Topical, Daily, Ouma, Bing Neighbors, NP, 6 each at 07/19/21 2228   fluticasone (FLONASE) 50 MCG/ACT nasal spray 1 spray, 1 spray, Each Nare,  Daily, Oswald Hillock, MD, 1 spray at 07/19/21 0827   furosemide (LASIX) injection 40 mg, 40 mg, Intravenous, Daily, Tang, Lily Michelle, PA-C, 40 mg at 07/19/21 7829   gabapentin (NEURONTIN) capsule 300 mg, 300 mg, Oral, Q1200, Cox, Amy N, DO, 300 mg at 07/19/21 1435   heparin injection 5,000 Units, 5,000 Units, Subcutaneous, Q8H, Cox, Amy N, DO, 5,000 Units at 07/20/21 5621   hydrocerin (EUCERIN) cream, , Topical, Daily, Wouk, Ailene Rud, MD   levothyroxine (SYNTHROID) tablet 125 mcg, 125 mcg, Oral, Daily, Cox, Amy N, DO, 125 mcg at 07/20/21 0529   midodrine (PROAMATINE) tablet 2.5 mg, 2.5 mg, Oral, TID WC, Amauria Younts, MD, 2.5 mg at  07/20/21 0811   montelukast (SINGULAIR) tablet 10 mg, 10 mg, Oral, Daily, Cox, Amy N, DO, 10 mg at 07/19/21 0826   ondansetron (ZOFRAN) injection 4 mg, 4 mg, Intravenous, Q6H PRN, Cox, Amy N, DO, 4 mg at 07/16/21 0414   pantoprazole (PROTONIX) EC tablet 40 mg, 40 mg, Oral, Daily, Cox, Amy N, DO, 40 mg at 07/19/21 0827   rOPINIRole (REQUIP) tablet 3 mg, 3 mg, Oral, TID, Cox, Amy N, DO, 3 mg at 07/19/21 1752   sodium chloride flush (NS) 0.9 % injection 3 mL, 3 mL, Intravenous, Q12H, Cox, Amy N, DO, 3 mL at 07/19/21 2228   sodium chloride flush (NS) 0.9 % injection 3 mL, 3 mL, Intravenous, PRN, Cox, Amy N, DO, 3 mL at 07/17/21 3086    ALLERGIES   Patient has no known allergies.     REVIEW OF SYSTEMS    Review of Systems:  Gen:  Denies  fever, sweats, chills weigh loss  HEENT: Denies blurred vision, double vision, ear pain, eye pain, hearing loss, nose bleeds, sore throat Cardiac:  No dizziness, chest pain or heaviness, chest tightness,edema Resp:   reports cough, shortness of breath,wheezing, deinies hemoptysis,  Gi: Denies swallowing difficulty, stomach pain, nausea or vomiting, diarrhea, constipation, bowel incontinence Gu:  Denies bladder incontinence, burning urine Ext:   Denies Joint pain, stiffness or swelling Skin: Denies  skin rash, easy bruising or bleeding or hives Endoc:  Denies polyuria, polydipsia , polyphagia or weight change Psych:   Denies depression, insomnia or hallucinations   Other:  All other systems negative   VS: BP 119/67 (BP Location: Left Arm)    Pulse (!) 56    Temp 97.9 F (36.6 C) (Oral)    Resp 20    Ht 5\' 5"  (1.651 m)    Wt (!) 151.4 kg    SpO2 93%    BMI 55.54 kg/m      PHYSICAL EXAM    GENERAL:Obese somnolent mild distress no fevers, chills,  HEAD: Normocephalic, atraumatic.  EYES: Pupils equal, round, reactive to light. Extraocular muscles intact. No scleral icterus.  MOUTH: dry mucosal membrane. Dentition intact. No abscess noted.   EAR, NOSE, THROAT: Clear without exudates. No external lesions.  NECK: Supple. No thyromegaly. No nodules. No JVD.  PULMONARY: Decreased breath sounds bilaterally without wheezing or rhonchorous lung sounds CARDIOVASCULAR: S1 and S2. Regular rate and rhythm. No murmurs, rubs, or gallops. No edema. Pedal pulses 2+ bilaterally.  GASTROINTESTINAL: Soft, nontender, nondistended. No masses. Positive bowel sounds. No hepatosplenomegaly.  Obese abdomen MUSCULOSKELETAL: No swelling, clubbing, or +2 edema. Range of motion full in all extremities.  NEUROLOGIC:GCS9  SKIN: No ulceration, lesions, rashes, or cyanosis. Skin warm and dry. Turgor intact.  PSYCHIATRIC: Mood, affect is flat      IMAGING  DG Chest 2 View  Result Date: 07/15/2021 CLINICAL DATA:  Dyspnea EXAM: CHEST - 2 VIEW COMPARISON:  03/14/2021 FINDINGS: Bibasilar opacification likely relates to overlying soft tissue. Lungs are clear. No pneumothorax or pleural effusion. Mild cardiomegaly is stable. Central pulmonary arterial enlargement is again noted in keeping with changes of pulmonary arterial hypertension. IMPRESSION: Stable cardiomegaly. Morphologic changes in keeping with pulmonary arterial hypertension. Electronically Signed   By: Fidela Salisbury M.D.   On: 07/15/2021 22:15   NM Pulmonary Perfusion  Result Date: 07/18/2021 CLINICAL DATA:  Shortness of breath, history CHF EXAM: NUCLEAR MEDICINE PERFUSION LUNG SCAN TECHNIQUE: Perfusion images were obtained in multiple projections after intravenous injection of radiopharmaceutical. Lateral views could not be obtained due to body habitus. Ventilation scans intentionally deferred if perfusion scan and chest x-ray adequate for interpretation during COVID 19 epidemic. RADIOPHARMACEUTICALS:  4.46 mCi Tc-19m MAA IV COMPARISON:  04/15/2020 FINDINGS: Generally nonsegmental diminished perfusion in basilar portions of LEFT lower lobe. No wedge-shaped segmental or subsegmental perfusion defects  identified. Chest radiograph demonstrates significant bibasilar pulmonary opacities and a RIGHT pleural effusion. IMPRESSION: Generally diminished perfusion in the basilar portions of the LEFT lower lobe in a nonsegmental pattern; no segmental or subsegmental perfusion defects identified to suggest pulmonary embolism. Electronically Signed   By: Lavonia Dana M.D.   On: 07/18/2021 15:00   US Venous Img Lower Bilateral (DVT)  Result Date: 07/20/2021 CLINICAL DATA:  Lower extremity edema EXAM: BILATERAL LOWER EXTREMITY VENOUS DOPPLER ULTRASOUND TECHNIQUE: Gray-scale sonography with graded compression, as well as color Doppler and duplex ultrasound were performed to evaluate the lower extremity deep venous systems from the level of the common femoral vein and including the common femoral, femoral, profunda femoral, popliteal and calf veins including the posterior tibial, peroneal and gastrocnemius veins when visible. The superficial great saphenous vein was also interrogated. Spectral Doppler was utilized to evaluate flow at rest and with distal augmentation maneuvers in the common femoral, femoral and popliteal veins. COMPARISON:  None. FINDINGS: RIGHT LOWER EXTREMITY Common Femoral Vein: No evidence of thrombus. Normal compressibility, respiratory phasicity and response to augmentation. Saphenofemoral Junction: No evidence of thrombus. Normal compressibility and flow on color Doppler imaging. Profunda Femoral Vein: No evidence of thrombus. Normal compressibility and flow on color Doppler imaging. Femoral Vein: No evidence of thrombus. Normal compressibility, respiratory phasicity and response to augmentation. Popliteal Vein: No evidence of thrombus. Normal compressibility, respiratory phasicity and response to augmentation. Calf Veins: Unable to visualize the calf veins secondary to patient body habitus. Superficial Great Saphenous Vein: No evidence of thrombus. Normal compressibility. Venous Reflux:  None. Other  Findings:  None. LEFT LOWER EXTREMITY Common Femoral Vein: No evidence of thrombus. Normal compressibility, respiratory phasicity and response to augmentation. Saphenofemoral Junction: No evidence of thrombus. Normal compressibility and flow on color Doppler imaging. Profunda Femoral Vein: No evidence of thrombus. Normal compressibility and flow on color Doppler imaging. Femoral Vein: No evidence of thrombus. Normal compressibility, respiratory phasicity and response to augmentation. Popliteal Vein: No evidence of thrombus. Normal compressibility, respiratory phasicity and response to augmentation. Calf Veins: Unable to visualize the calf veins secondary to patient body habitus. Superficial Great Saphenous Vein: No evidence of thrombus. Normal compressibility. Venous Reflux:  None. Other Findings:  None. IMPRESSION: Technically challenging and limited examination secondary to patient body habitus. No evidence of deep venous thrombosis to the level of the knees bilaterally. The calf veins are not well seen in either lower extremity. Electronically Signed   By: Dellis Filbert.D.  On: 07/20/2021 06:31   DG Chest Port 1 View  Result Date: 07/19/2021 CLINICAL DATA:  67 year old female with shortness of breath. EXAM: PORTABLE CHEST 1 VIEW COMPARISON:  Portable chest 07/18/2021 and earlier. FINDINGS: Portable AP upright view at 0417 hours. Stable cardiomegaly and mediastinal contours. Continued somewhat low lung volumes. Diffuse indistinct appearance of the pulmonary vasculature and somewhat symmetric veiling lower lung opacity. No air bronchograms. No pneumothorax. Visualized tracheal air column is within normal limits. No acute osseous abnormality identified. IMPRESSION: Unchanged ventilation from yesterday with suspected pulmonary interstitial edema and small pleural effusions. Underlying cardiomegaly. Electronically Signed   By: Genevie Ann M.D.   On: 07/19/2021 04:56   DG Chest Port 1 View  Result Date:  07/18/2021 CLINICAL DATA:  Dyspnea EXAM: PORTABLE CHEST 1 VIEW COMPARISON:  Chest x-ray 07/15/2021 FINDINGS: Cardiomegaly and mediastinum appear unchanged. Pulmonary vascular congestion with increasing opacities in the lower lung zones since previous study. No large pleural effusion appreciated. No pneumothorax. IMPRESSION: Cardiomegaly with increasing pulmonary vascular congestion and lower lung zone opacities which may represent edema. Electronically Signed   By: Ofilia Neas M.D.   On: 07/18/2021 10:06       ASSESSMENT/PLAN   Acute on chronic hypoxemic and hypercapnic respiratory failure -Likely due to thoracic restriction with obesity hypoventilation syndrome and obstructive sleep apnea overlap -Would like to perform spirometry with graph to evaluate residual lung function and stratification of ventilatory defect -Arterial blood gas to allow patient for BiPAP while here and qualification for home use -Consultation with [facility] for medical equipment and supplies  -Patient would benefit from bariatric evaluation -Complicated by fluid shifts secondary to heart failure and renal impairment -Would benefit from occupational and physical therapy -D-dimer to rule out pulmonary venous thromboembolism-is very high will check DVT study -RD nutritional evaluation for appropriate dietary changes with morbid obesity , hypothyroid and DM2   Severe hyperkalemia   kayexalate x 1  Pharmacy consult Lasix delivered x 2           -continue diuresis and BIPAP   Acute on chronic diastolic CHF  Continue BIPAP and lasix with albumin    GI/DVT ppx Protonix /heparin   Critical care provider statement:   Total critical care time: 33 minutes   Performed by: Lanney Gins MD   Critical care time was exclusive of separately billable procedures and treating other patients.   Critical care was necessary to treat or prevent imminent or life-threatening deterioration.   Critical care was time spent  personally by me on the following activities: development of treatment plan with patient and/or surrogate as well as nursing, discussions with consultants, evaluation of patient's response to treatment, examination of patient, obtaining history from patient or surrogate, ordering and performing treatments and interventions, ordering and review of laboratory studies, ordering and review of radiographic studies, pulse oximetry and re-evaluation of patient's condition.         Ottie Glazier, M.D.  Division of [place]

## 2021-07-20 NOTE — NC FL2 (Signed)
[place] LEVEL OF CARE SCREENING TOOL     IDENTIFICATION  Patient Name: Madison Mcbride Birthdate: 1955-09-28 Sex: female Admission Date (Current Location): 07/15/2021  [facility] [place] Number:  Engineering geologist and Address:  [facility], [address], [place], [place] [address]      Provider Number: [id number]  Attending Physician Name and Address:  Gwynne Edinger, MD  Relative Name and Phone Number:  Cofer,Benjamin Spouse [phone]    Current Level of Care: Hospital Recommended Level of Care: [place] Prior Approval Number:    Date Approved/Denied:   PASRR Number: [id number]  Discharge Plan: SNF    Current Diagnoses: Patient Active Problem List   Diagnosis Date Noted   Acute exacerbation of CHF (congestive heart failure) ([place]) 07/15/2021   Effusion of knee joint, left 03/22/2021   Acute on chronic systolic CHF (congestive heart failure) ([place]) 03/19/2021   Acute on chronic heart failure ([place]) 03/14/2021   Acute hypoxemic respiratory failure ([place]) 03/14/2021   Acute on chronic diastolic CHF (congestive heart failure) ([place]) 04/09/2020   Diabetes mellitus without complication ([place]) 49/17/9150   HLD (hyperlipidemia) 04/09/2020   Depression 04/09/2020   Elevated troponin 04/09/2020   OSA (obstructive sleep apnea) 04/09/2020   Peripheral polyneuropathy 02/29/2020   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   Lumbar radiculopathy, acute    Acute midline low back pain without sciatica    Type 2 diabetes mellitus with diabetic neuropathy, without long-term current use of insulin ([place]) 02/21/2020   Hypothyroidism 02/21/2020   Morbid obesity with BMI of 50.0-59.9, adult ([place]) 02/21/2020   Severe sepsis ([place]) 02/21/2020   CAP (community acquired pneumonia) 02/21/2020   Bilateral cellulitis of lower leg 02/21/2020   Acute respiratory failure with hypoxia and  hypercapnia ([place]) 02/21/2020   Acute renal failure superimposed on stage 3a chronic kidney disease ([place]) 56/97/9480   Acute metabolic encephalopathy 16/55/3748   Septic shock ([place]) 02/21/2020   Stage 3a chronic kidney disease ([place]) 01/27/2020   Transaminitis 01/27/2020   Lymphedema of both lower extremities 08/21/2016   Benign essential hypertension 05/18/2016    Orientation RESPIRATION BLADDER Height & Weight     Self, Time, Situation, Place  O2 (Wears BiPAP at night.) Incontinent Weight: (!) 333 lb 12.4 oz (151.4 kg) Height:  5\' 5"  (165.1 cm)  BEHAVIORAL SYMPTOMS/MOOD NEUROLOGICAL BOWEL NUTRITION STATUS      Incontinent Diet  AMBULATORY STATUS COMMUNICATION OF NEEDS Skin   Extensive Assist Verbally Other (Comment) (Contact dematitis)                       Personal Care Assistance Level of Assistance  Feeding, Total care, Bathing, Dressing Bathing Assistance: Limited assistance Feeding assistance: Independent Dressing Assistance: Limited assistance Total Care Assistance: Limited assistance   Functional Limitations Info  Speech, Hearing, Sight Sight Info: Adequate Hearing Info: Adequate Speech Info: Adequate    SPECIAL CARE FACTORS FREQUENCY  OT (By licensed OT), PT (By licensed PT)     PT Frequency: 5x per week OT Frequency: 5x per week            Contractures Contractures Info: Not present    Additional Factors Info  Psychotropic   Pfizer COVID-19 Vaccine 11/17/2019 , 10/27/2019               Current Medications (07/20/2021):  This is the current hospital active medication list Current Facility-Administered Medications  Medication Dose Route Frequency Provider Last  Rate Last Admin   0.9 %  sodium chloride infusion  250 mL Intravenous PRN Cox, Amy N, DO       acetaminophen (TYLENOL) tablet 650 mg  650 mg Oral Q4H PRN Cox, Amy N, DO   650 mg at 07/20/21 0140   atorvastatin (LIPITOR) tablet 20 mg  20 mg Oral Q2200 Rust-[place], Huel Cote, NP        buPROPion (WELLBUTRIN XL) 24 hr tablet 150 mg  150 mg Oral Daily Cox, Amy N, DO   150 mg at 07/20/21 1059   carvedilol (COREG) tablet 3.125 mg  3.125 mg Oral BID WC Tang, Alanson Puls, PA-C   3.125 mg at 07/20/21 5638   Chlorhexidine Gluconate Cloth 2 % PADS 6 each  6 each Topical Daily Lang Snow, NP   6 each at 07/19/21 2228   fluticasone (FLONASE) 50 MCG/ACT nasal spray 1 spray  1 spray Each Nare Daily Oswald Hillock, MD   1 spray at 07/20/21 1100   furosemide (LASIX) injection 40 mg  40 mg Intravenous Daily Tristan Schroeder, PA-C   40 mg at 07/20/21 1100   gabapentin (NEURONTIN) capsule 300 mg  300 mg Oral Q1200 Cox, Amy N, DO   300 mg at 07/20/21 1245   heparin injection 5,000 Units  5,000 Units Subcutaneous Q8H Cox, Amy N, DO   5,000 Units at 07/20/21 1417   hydrocerin (EUCERIN) cream   Topical Daily Gwynne Edinger, MD   1 application at 93/73/42 1045   levothyroxine (SYNTHROID) tablet 125 mcg  125 mcg Oral Daily Cox, Amy N, DO   125 mcg at 07/20/21 0529   midodrine (PROAMATINE) tablet 2.5 mg  2.5 mg Oral TID WC Ottie Glazier, MD   2.5 mg at 07/20/21 1247   montelukast (SINGULAIR) tablet 10 mg  10 mg Oral Daily Cox, Amy N, DO   10 mg at 07/20/21 1059   ondansetron (ZOFRAN) injection 4 mg  4 mg Intravenous Q6H PRN Cox, Amy N, DO   4 mg at 07/16/21 0414   pantoprazole (PROTONIX) EC tablet 40 mg  40 mg Oral Daily Cox, Amy N, DO   40 mg at 07/20/21 1059   rOPINIRole (REQUIP) tablet 3 mg  3 mg Oral TID Cox, Amy N, DO   3 mg at 07/20/21 1100   sodium chloride flush (NS) 0.9 % injection 3 mL  3 mL Intravenous Q12H Cox, Amy N, DO   3 mL at 07/20/21 1000   sodium chloride flush (NS) 0.9 % injection 3 mL  3 mL Intravenous PRN Cox, Amy N, DO   3 mL at 07/17/21 8768     Discharge Medications: Please see discharge summary for a list of discharge medications.  Relevant Imaging Results:  Relevant Lab Results:   Additional Information [id number]  Daryll Brod Carles Florea,  LCSWA

## 2021-07-20 NOTE — Progress Notes (Signed)
[facility]  PROGRESS NOTE  Madison Mcbride OEV:[id number] DOB: December 19, 1955 DOA: 07/15/2021 PCP: Rusty Aus, MD   Brief HPI:   66 year old female with medical history of morbid obesity, OSA on CPAP, CHF, depression, anxiety, hyperlipidemia, neuropathy, diabetes mellitus type 2 presented to the ED with complaints of shortness of breath.  As per patient she forgot to take her fluid medications for past 2 days.  She does not always wear her BiPAP machine at night.  Denies chest pain or nausea vomiting or diarrhea.  In the hospital patient requiring oxygen however at home she does not feel her oxygen. In the ED initially SPO2 was 85% on room air improved to 97% on 2 L of oxygen via nasal cannula.  Patient received furosemide 60 mg IV in the ED. ABG showed PCO2 of 76, pH 7.21.  Pulmonology was consulted, patient placed on BiPAP.  This morning patient is alert, oriented x3.  Denies shortness of breath.  Cardiology also was consulted for guidance regarding diuresis for CHF    Subjective   Reports feeling a good bit better. No confusion, breathing comfortably, no chest pain   Assessment/Plan:    Acute hypercarbic and hypoxemic respiratory failure HPEF Secondary to CHF exacerbation, OSA, OHS. Has been treated with bipap, now off that during the day. Vq scan not suggestive of PE - cont bipap qhs - diuresis lasix 40 iv qd - cont [facility] o2, wean as able  Debility - pt/ot consults pending, pt desires snf if qualifies  Acute kidney injury on CKD stage III Cr improving with diuresis, today 1.52 from baseline of around 1.3 - cont diuresis  Venous stasis dermatitis Physical exam today consistent with chronic venous stasis, no signs cellulitis - have stopped ceftriaxone - ted hose  OSA -Continue BiPAP qhs -Pulmonology following  Hyperlipidemia -Continue atorvastatin 20 mg daily  Depression/anxiety -Continue bupropion  Restless leg syndrome -Continue  ropinirole  Neuropathy -Continue Requip - gabapentin on hold 2/2 episodes encephalopathy  Hyperkalemia Resolved - monitor  T2DM Daily fastings wnl - monitor   Medications     atorvastatin  20 mg Oral Q2200   buPROPion  150 mg Oral Daily   carvedilol  3.125 mg Oral BID WC   Chlorhexidine Gluconate Cloth  6 each Topical Daily   fluticasone  1 spray Each Nare Daily   furosemide  40 mg Intravenous Daily   gabapentin  300 mg Oral Q1200   heparin  5,000 Units Subcutaneous Q8H   hydrocerin   Topical Daily   levothyroxine  125 mcg Oral Daily   midodrine  2.5 mg Oral TID WC   montelukast  10 mg Oral Daily   pantoprazole  40 mg Oral Daily   rOPINIRole  3 mg Oral TID   sodium chloride flush  3 mL Intravenous Q12H     Data Reviewed:   CBG:  Recent Labs  Lab 07/18/21 0524  GLUCAP 89    SpO2: 92 % O2 Flow Rate (L/min): 3 L/min FiO2 (%): 40 %    Vitals:   07/20/21 0500 07/20/21 0712 07/20/21 0800 07/20/21 0900  BP: 114/68 119/67 136/71 110/76  Pulse: (!) 50 (!) 56 (!) 53 (!) 58  Resp: 18 20 20  (!) 23  Temp:  97.9 F (36.6 C)    TempSrc:  Oral    SpO2: 96% 93% 93% 92%  Weight: (!) 151.4 kg     Height:         Intake/Output Summary (Last 24 hours) at 07/20/2021  [place] filed at 07/20/2021 0530 Gross per 24 hour  Intake 240 ml  Output 1500 ml  Net -1260 ml    01/06 1901 - 01/08 0700 In: 720 [P.O.:720] Out: 2150 [Urine:2150]  Filed Weights   07/18/21 0530 07/19/21 0312 07/20/21 0500  Weight: (!) 157.7 kg (!) 156.4 kg (!) 151.4 kg    Data Reviewed: Basic Metabolic Panel: Recent Labs  Lab 07/16/21 0712 07/17/21 0706 07/18/21 0535 07/19/21 0439 07/20/21 0309  NA 137 138 138 139 139  K 4.9 5.5* 4.8 4.5 4.4  CL 99 100 98 100 99  CO2 27 28 31 31  32  GLUCOSE 95 86 85 90 81  BUN 39* 48* 52* 51* 45*  CREATININE 1.76* 2.23* 2.16* 1.97* 1.52*  CALCIUM 8.0* 7.9* 8.0* 7.9* 8.2*  MG  --   --   --  2.2 2.3  PHOS  --   --   --  5.2* 4.2   Liver  Function Tests: Recent Labs  Lab 07/19/21 0439  AST 71*  ALT 68*  ALKPHOS 108  BILITOT 1.2  PROT 6.7  ALBUMIN 3.0*   No results for input(s): LIPASE, AMYLASE in the last 168 hours. No results for input(s): AMMONIA in the last 168 hours. CBC: Recent Labs  Lab 07/15/21 2147 07/16/21 0712 07/19/21 0439 07/20/21 0309  WBC 10.5 10.1 11.6* 9.5  HGB 12.2 11.9* 11.4* 10.9*  HCT 42.6 41.9 40.2 37.8  MCV 88.6 89.3 87.0 87.5  PLT 426* 378 325 318   Cardiac Enzymes: No results for input(s): CKTOTAL, CKMB, CKMBINDEX, TROPONINI in the last 168 hours. BNP (last 3 results) Recent Labs    03/14/21 1817 03/16/21 0819 07/15/21 2147  BNP 1,805.1* 1,352.0* 1,583.1*    ProBNP (last 3 results) No results for input(s): PROBNP in the last 8760 hours.  CBG: Recent Labs  Lab 07/18/21 0524  GLUCAP 89       Radiology Reports  NM Pulmonary Perfusion  Result Date: 07/18/2021 CLINICAL DATA:  Shortness of breath, history CHF EXAM: NUCLEAR MEDICINE PERFUSION LUNG SCAN TECHNIQUE: Perfusion images were obtained in multiple projections after intravenous injection of radiopharmaceutical. Lateral views could not be obtained due to body habitus. Ventilation scans intentionally deferred if perfusion scan and chest x-ray adequate for interpretation during COVID 19 epidemic. RADIOPHARMACEUTICALS:  4.46 mCi Tc-42m MAA IV COMPARISON:  04/15/2020 FINDINGS: Generally nonsegmental diminished perfusion in basilar portions of LEFT lower lobe. No wedge-shaped segmental or subsegmental perfusion defects identified. Chest radiograph demonstrates significant bibasilar pulmonary opacities and a RIGHT pleural effusion. IMPRESSION: Generally diminished perfusion in the basilar portions of the LEFT lower lobe in a nonsegmental pattern; no segmental or subsegmental perfusion defects identified to suggest pulmonary embolism. Electronically Signed   By: Lavonia Dana M.D.   On: 07/18/2021 15:00   US Venous Img Lower  Bilateral (DVT)  Result Date: 07/20/2021 CLINICAL DATA:  Lower extremity edema EXAM: BILATERAL LOWER EXTREMITY VENOUS DOPPLER ULTRASOUND TECHNIQUE: Gray-scale sonography with graded compression, as well as color Doppler and duplex ultrasound were performed to evaluate the lower extremity deep venous systems from the level of the common femoral vein and including the common femoral, femoral, profunda femoral, popliteal and calf veins including the posterior tibial, peroneal and gastrocnemius veins when visible. The superficial great saphenous vein was also interrogated. Spectral Doppler was utilized to evaluate flow at rest and with distal augmentation maneuvers in the common femoral, femoral and popliteal veins. COMPARISON:  None. FINDINGS: RIGHT LOWER EXTREMITY Common Femoral Vein: No evidence  of thrombus. Normal compressibility, respiratory phasicity and response to augmentation. Saphenofemoral Junction: No evidence of thrombus. Normal compressibility and flow on color Doppler imaging. Profunda Femoral Vein: No evidence of thrombus. Normal compressibility and flow on color Doppler imaging. Femoral Vein: No evidence of thrombus. Normal compressibility, respiratory phasicity and response to augmentation. Popliteal Vein: No evidence of thrombus. Normal compressibility, respiratory phasicity and response to augmentation. Calf Veins: Unable to visualize the calf veins secondary to patient body habitus. Superficial Great Saphenous Vein: No evidence of thrombus. Normal compressibility. Venous Reflux:  None. Other Findings:  None. LEFT LOWER EXTREMITY Common Femoral Vein: No evidence of thrombus. Normal compressibility, respiratory phasicity and response to augmentation. Saphenofemoral Junction: No evidence of thrombus. Normal compressibility and flow on color Doppler imaging. Profunda Femoral Vein: No evidence of thrombus. Normal compressibility and flow on color Doppler imaging. Femoral Vein: No evidence of thrombus.  Normal compressibility, respiratory phasicity and response to augmentation. Popliteal Vein: No evidence of thrombus. Normal compressibility, respiratory phasicity and response to augmentation. Calf Veins: Unable to visualize the calf veins secondary to patient body habitus. Superficial Great Saphenous Vein: No evidence of thrombus. Normal compressibility. Venous Reflux:  None. Other Findings:  None. IMPRESSION: Technically challenging and limited examination secondary to patient body habitus. No evidence of deep venous thrombosis to the level of the knees bilaterally. The calf veins are not well seen in either lower extremity. Electronically Signed   By: Jacqulynn Cadet M.D.   On: 07/20/2021 06:31   DG Chest Port 1 View  Result Date: 07/19/2021 CLINICAL DATA:  66 year old female with shortness of breath. EXAM: PORTABLE CHEST 1 VIEW COMPARISON:  Portable chest 07/18/2021 and earlier. FINDINGS: Portable AP upright view at 0417 hours. Stable cardiomegaly and mediastinal contours. Continued somewhat low lung volumes. Diffuse indistinct appearance of the pulmonary vasculature and somewhat symmetric veiling lower lung opacity. No air bronchograms. No pneumothorax. Visualized tracheal air column is within normal limits. No acute osseous abnormality identified. IMPRESSION: Unchanged ventilation from yesterday with suspected pulmonary interstitial edema and small pleural effusions. Underlying cardiomegaly. Electronically Signed   By: Genevie Ann M.D.   On: 07/19/2021 04:56       Antibiotics: Anti-infectives (From admission, onward)    Start     Dose/Rate Route Frequency Ordered Stop   07/17/21 0900  cefTRIAXone (ROCEPHIN) 2 g in sodium chloride 0.9 % 100 mL IVPB  Status:  Discontinued        2 g 200 mL/hr over 30 Minutes Intravenous Every 24 hours 07/17/21 0850 07/19/21 1013         DVT prophylaxis:   Code Status: Full code  Family Communication: No family at  bedside   Consultants:   Procedures:     Objective    Physical Examination:  General-appears in no acute distress Heart-S1-S2, regular, no murmur auscultated Lungs-decreased breath sounds bilaterally Abdomen-soft, nontender, no organomegaly Extremities-1+  edema in the lower extremities, venous stasis changes Neuro-alert, oriented x3, no focal deficit noted  Status is: Inpatient  Dispo: The patient is from: Home              Anticipated d/c is to: snf              Anticipated d/c date is: tbd              Patient currently not stable for discharge  Barrier to discharge-ongoing evaluation and management for CHF exacerbation  COVID-19 Labs  No results for input(s): DDIMER, FERRITIN, LDH, CRP in the last  72 hours.   Lab Results  Component Value Date   SARSCOV2NAA NEGATIVE 07/15/2021   SARSCOV2NAA NEGATIVE 03/24/2021   [place] NEGATIVE 03/14/2021   [place] NEGATIVE 04/17/2020            Recent Results (from the past 240 hour(s))  Resp Panel by RT-PCR (Flu A&B, Covid) Nasopharyngeal Swab     Status: None   Collection Time: 07/15/21 10:29 PM   Specimen: Nasopharyngeal Swab; Nasopharyngeal(NP) swabs in vial transport medium  Result Value Ref Range Status   SARS Coronavirus 2 by RT PCR NEGATIVE NEGATIVE Final    Comment: (NOTE) SARS-CoV-2 target nucleic acids are NOT DETECTED.  The SARS-CoV-2 RNA is generally detectable in upper respiratory specimens during the acute phase of infection. The lowest concentration of SARS-CoV-2 viral copies this assay can detect is 138 copies/mL. A negative result does not preclude SARS-Cov-2 infection and should not be used as the sole basis for treatment or other patient management decisions. A negative result may occur with  improper specimen collection/handling, submission of specimen other than nasopharyngeal swab, presence of viral mutation(s) within the areas targeted by this assay, and inadequate number of  viral copies(<138 copies/mL). A negative result must be combined with clinical observations, patient history, and epidemiological information. The expected result is Negative.  Fact Sheet for Patients:  EntrepreneurPulse.com.au  Fact Sheet for Healthcare Providers:  IncredibleEmployment.be  This test is no t yet approved or cleared by the [place] FDA and  has been authorized for detection and/or diagnosis of SARS-CoV-2 by FDA under an Emergency Use Authorization (EUA). This EUA will remain  in effect (meaning this test can be used) for the duration of the COVID-19 declaration under Section 564(b)(1) of the Act, 21 U.S.C.section 360bbb-3(b)(1), unless the authorization is terminated  or revoked sooner.       Influenza A by PCR NEGATIVE NEGATIVE Final   Influenza B by PCR NEGATIVE NEGATIVE Final    Comment: (NOTE) The Xpert Xpress SARS-CoV-2/FLU/RSV plus assay is intended as an aid in the diagnosis of influenza from Nasopharyngeal swab specimens and should not be used as a sole basis for treatment. Nasal washings and aspirates are unacceptable for Xpert Xpress SARS-CoV-2/FLU/RSV testing.  Fact Sheet for Patients: EntrepreneurPulse.com.au  Fact Sheet for Healthcare Providers: IncredibleEmployment.be  This test is not yet approved or cleared by the [place] FDA and has been authorized for detection and/or diagnosis of SARS-CoV-2 by FDA under an Emergency Use Authorization (EUA). This EUA will remain in effect (meaning this test can be used) for the duration of the COVID-19 declaration under Section 564(b)(1) of the Act, 21 U.S.C. section 360bbb-3(b)(1), unless the authorization is terminated or revoked.  Performed at [facility], [place], [place] [address]   Respiratory (~20 pathogens) panel by PCR     Status: None   Collection Time: 07/16/21  9:40 PM   Specimen:  Nasopharyngeal Swab; Respiratory  Result Value Ref Range Status   Adenovirus NOT DETECTED NOT DETECTED Final   Coronavirus 229E NOT DETECTED NOT DETECTED Final    Comment: (NOTE) The Coronavirus on the Respiratory Panel, DOES NOT test for the novel  Coronavirus (2019 nCoV)    Coronavirus HKU1 NOT DETECTED NOT DETECTED Final   Coronavirus NL63 NOT DETECTED NOT DETECTED Final   Coronavirus OC43 NOT DETECTED NOT DETECTED Final   Metapneumovirus NOT DETECTED NOT DETECTED Final   Rhinovirus / Enterovirus NOT DETECTED NOT DETECTED Final   Influenza A NOT DETECTED NOT DETECTED Final  Influenza B NOT DETECTED NOT DETECTED Final   Parainfluenza Virus 1 NOT DETECTED NOT DETECTED Final   Parainfluenza Virus 2 NOT DETECTED NOT DETECTED Final   Parainfluenza Virus 3 NOT DETECTED NOT DETECTED Final   Parainfluenza Virus 4 NOT DETECTED NOT DETECTED Final   Respiratory Syncytial Virus NOT DETECTED NOT DETECTED Final   Bordetella pertussis NOT DETECTED NOT DETECTED Final   Bordetella Parapertussis NOT DETECTED NOT DETECTED Final   Chlamydophila pneumoniae NOT DETECTED NOT DETECTED Final   Mycoplasma pneumoniae NOT DETECTED NOT DETECTED Final    Comment: Performed at [facility], [place] [address], [place], [place] [address]  MRSA Next Gen by PCR, Nasal     Status: None   Collection Time: 07/18/21  4:52 AM   Specimen: Nasal Mucosa; Nasal Swab  Result Value Ref Range Status   MRSA by PCR Next Gen NOT DETECTED NOT DETECTED Final    Comment: (NOTE) The GeneXpert MRSA Assay (FDA approved for NASAL specimens only), is one component of a comprehensive MRSA colonization surveillance program. It is not intended to diagnose MRSA infection nor to guide or monitor treatment for MRSA infections. Test performance is not FDA approved in patients less than 68 years old. Performed at [facility], [address] [place],  [address]     [facility] If 7PM-7AM,  please contact night-coverage at [email or web address], Office  [phone]   07/20/2021, 12:05 PM  LOS: 3 days

## 2021-07-20 NOTE — Progress Notes (Signed)
[facility]  SUBJECTIVE: Patient laying in bed, denies chest pain, reports improved breathing   Vitals:   07/20/21 0300 07/20/21 0400 07/20/21 0500 07/20/21 0712  BP: (!) 159/87 116/61 114/68 119/67  Pulse: (!) 49 (!) 50 (!) 50 (!) 56  Resp: 18 18 18 20   Temp:  98.5 F (36.9 C)  97.9 F (36.6 C)  TempSrc:  Axillary  Oral  SpO2: 97% 97% 96% 93%  Weight:   (!) 151.4 kg   Height:         Intake/Output Summary (Last 24 hours) at 07/20/2021 1121 Last data filed at 07/20/2021 0530 Gross per 24 hour  Intake 240 ml  Output 1500 ml  Net -1260 ml      PHYSICAL EXAM  General: Well developed, well nourished, in no acute distress HEENT:  Normocephalic and atramatic Neck:  No JVD.  Lungs: Clear bilaterally to auscultation and percussion. Heart: HRRR . Normal S1 and S2 without gallops or murmurs.  Abdomen: Bowel sounds are positive, abdomen soft and non-tender  Msk:  Back normal, normal gait. Normal strength and tone for age. Extremities: No clubbing, cyanosis or edema.   Neuro: Alert and oriented X 3. Psych:  Good affect, responds appropriately   LABS: Basic Metabolic Panel: Recent Labs    07/19/21 0439 07/20/21 0309  NA 139 139  K 4.5 4.4  CL 100 99  CO2 31 32  GLUCOSE 90 81  BUN 51* 45*  CREATININE 1.97* 1.52*  CALCIUM 7.9* 8.2*  MG 2.2 2.3  PHOS 5.2* 4.2   Liver Function Tests: Recent Labs    07/19/21 0439  AST 71*  ALT 68*  ALKPHOS 108  BILITOT 1.2  PROT 6.7  ALBUMIN 3.0*   No results for input(s): LIPASE, AMYLASE in the last 72 hours. CBC: Recent Labs    07/19/21 0439 07/20/21 0309  WBC 11.6* 9.5  HGB 11.4* 10.9*  HCT 40.2 37.8  MCV 87.0 87.5  PLT 325 318   Cardiac Enzymes: No results for input(s): CKTOTAL, CKMB, CKMBINDEX, TROPONINI in the last 72 hours. BNP: Invalid input(s): POCBNP D-Dimer: No results for input(s): DDIMER in the last 72 hours. Hemoglobin A1C: No results for input(s): HGBA1C in the last 72 hours. Fasting  Lipid Panel: No results for input(s): CHOL, HDL, LDLCALC, TRIG, CHOLHDL, LDLDIRECT in the last 72 hours. Thyroid Function Tests: No results for input(s): TSH, T4TOTAL, T3FREE, THYROIDAB in the last 72 hours.  Invalid input(s): FREET3 Anemia Panel: No results for input(s): VITAMINB12, FOLATE, FERRITIN, TIBC, IRON, RETICCTPCT in the last 72 hours.  NM Pulmonary Perfusion  Result Date: 07/18/2021 CLINICAL DATA:  Shortness of breath, history CHF EXAM: NUCLEAR MEDICINE PERFUSION LUNG SCAN TECHNIQUE: Perfusion images were obtained in multiple projections after intravenous injection of radiopharmaceutical. Lateral views could not be obtained due to body habitus. Ventilation scans intentionally deferred if perfusion scan and chest x-ray adequate for interpretation during COVID 19 epidemic. RADIOPHARMACEUTICALS:  4.46 mCi Tc-44m MAA IV COMPARISON:  04/15/2020 FINDINGS: Generally nonsegmental diminished perfusion in basilar portions of LEFT lower lobe. No wedge-shaped segmental or subsegmental perfusion defects identified. Chest radiograph demonstrates significant bibasilar pulmonary opacities and a RIGHT pleural effusion. IMPRESSION: Generally diminished perfusion in the basilar portions of the LEFT lower lobe in a nonsegmental pattern; no segmental or subsegmental perfusion defects identified to suggest pulmonary embolism. Electronically Signed   By: Lavonia Dana M.D.   On: 07/18/2021 15:00   US Venous Img Lower Bilateral (DVT)  Result Date: 07/20/2021 CLINICAL DATA:  Lower  extremity edema EXAM: BILATERAL LOWER EXTREMITY VENOUS DOPPLER ULTRASOUND TECHNIQUE: Gray-scale sonography with graded compression, as well as color Doppler and duplex ultrasound were performed to evaluate the lower extremity deep venous systems from the level of the common femoral vein and including the common femoral, femoral, profunda femoral, popliteal and calf veins including the posterior tibial, peroneal and gastrocnemius veins when  visible. The superficial great saphenous vein was also interrogated. Spectral Doppler was utilized to evaluate flow at rest and with distal augmentation maneuvers in the common femoral, femoral and popliteal veins. COMPARISON:  None. FINDINGS: RIGHT LOWER EXTREMITY Common Femoral Vein: No evidence of thrombus. Normal compressibility, respiratory phasicity and response to augmentation. Saphenofemoral Junction: No evidence of thrombus. Normal compressibility and flow on color Doppler imaging. Profunda Femoral Vein: No evidence of thrombus. Normal compressibility and flow on color Doppler imaging. Femoral Vein: No evidence of thrombus. Normal compressibility, respiratory phasicity and response to augmentation. Popliteal Vein: No evidence of thrombus. Normal compressibility, respiratory phasicity and response to augmentation. Calf Veins: Unable to visualize the calf veins secondary to patient body habitus. Superficial Great Saphenous Vein: No evidence of thrombus. Normal compressibility. Venous Reflux:  None. Other Findings:  None. LEFT LOWER EXTREMITY Common Femoral Vein: No evidence of thrombus. Normal compressibility, respiratory phasicity and response to augmentation. Saphenofemoral Junction: No evidence of thrombus. Normal compressibility and flow on color Doppler imaging. Profunda Femoral Vein: No evidence of thrombus. Normal compressibility and flow on color Doppler imaging. Femoral Vein: No evidence of thrombus. Normal compressibility, respiratory phasicity and response to augmentation. Popliteal Vein: No evidence of thrombus. Normal compressibility, respiratory phasicity and response to augmentation. Calf Veins: Unable to visualize the calf veins secondary to patient body habitus. Superficial Great Saphenous Vein: No evidence of thrombus. Normal compressibility. Venous Reflux:  None. Other Findings:  None. IMPRESSION: Technically challenging and limited examination secondary to patient body habitus. No evidence  of deep venous thrombosis to the level of the knees bilaterally. The calf veins are not well seen in either lower extremity. Electronically Signed   By: Jacqulynn Cadet M.D.   On: 07/20/2021 06:31   DG Chest Port 1 View  Result Date: 07/19/2021 CLINICAL DATA:  66 year old female with shortness of breath. EXAM: PORTABLE CHEST 1 VIEW COMPARISON:  Portable chest 07/18/2021 and earlier. FINDINGS: Portable AP upright view at 0417 hours. Stable cardiomegaly and mediastinal contours. Continued somewhat low lung volumes. Diffuse indistinct appearance of the pulmonary vasculature and somewhat symmetric veiling lower lung opacity. No air bronchograms. No pneumothorax. Visualized tracheal air column is within normal limits. No acute osseous abnormality identified. IMPRESSION: Unchanged ventilation from yesterday with suspected pulmonary interstitial edema and small pleural effusions. Underlying cardiomegaly. Electronically Signed   By: Genevie Ann M.D.   On: 07/19/2021 04:56     Echo LVEF 45 to 50% 03/16/2021  TELEMETRY: Sinus rhythm:  ASSESSMENT AND PLAN:  Principal Problem:   Acute exacerbation of CHF (congestive heart failure) (HCC) Active Problems:   Hypothyroidism   Morbid obesity with BMI of 50.0-59.9, adult (HCC)   Acute respiratory failure with hypoxia and hypercapnia (HCC)   HLD (hyperlipidemia)   Depression   OSA (obstructive sleep apnea)   Acute hypoxemic respiratory failure (HCC)   Benign essential hypertension   Stage 3a chronic kidney disease ([place])    1.  Chronic diastolic congestive heart failure, HFpEF (LVEF 40-50% on 03/16/2021), on furosemide 40 mg IV daily and carvedilol 3.125 mg twice daily, renal function improving 2.  Acute hypercarbic and hypoxemic respiratory failure, obstructive  sleep apnea on CPAP, clinically improved 3.  Essential hypertension, blood pressure overall low normal on current medications 4.  Elevated D-dimer, no evidence for pulmonary embolism by VQ scan 5.   Bilateral lower extremity cellulitis, improving on ceftriaxone 6.  AKI on CKD, improving   Recommendations   1.  Agree with current therapy 2.  Continue diuresis 3.  Carefully monitor renal status 4.  Continue carvedilol 3.125 mg twice daily 5.  No further cardiac diagnostics at this time   Isaias Cowman, MD, PhD, [facility] 07/20/2021 11:21 AM

## 2021-07-20 NOTE — Progress Notes (Signed)
Patient alert and oriented x4 at start of shift. Patient began to become intermittently confused this afternoon, placed back on Bipap. MD made aware.Patient HR drop to 37, and high 40s, non-sustained. HR currently trending in low 50s, not symptomatic.  MD made aware. Coreg held. Wound care provided.

## 2021-07-20 NOTE — Consult Note (Signed)
WOC Nurse Consult Note: Reason for Consult: Bilateral LEs with dryness, cracking and resolving edema. Subpannicular skin fold erythema intertrigo. Patient known to me from previous encounter.  I am assisted in my consultation today by her Bedside RN, Neldon Newport, whose expertise and anticipatory actions were appreciated. Wound type: venous insufficiency, irritant contact dermatitis  ICD-10 CM Codes for Irritant Dermatitis  L30.4  - Erythema intertrigo. Also used for abrasion of the hand, chafing of the skin, dermatitis due to sweating and friction, friction dermatitis, friction eczema, and genital/thigh intertrigo.   Pressure Injury POA: N/A Wound bed: Dry, red Drainage (amount, consistency, odor) none Periwound: resolving edema to LEs, mild erythema Dressing procedure/placement/frequency:  Bedside RN implemented house antimicrobial wicking textile yesterday to the subpannicular skin fold dermatitis and this is appreciated as it is was I would have recommended today. We have collaborated on a POC today for her LEs and I have provided guidance for the daily application of Eucerin cream followed by covering the cracked open areas with folded pieces of xeroform, topped with ABD pads and secured with Kerlix.  I would expect resolution within a few days providing the patient continues the diuretic as prescribed.  [place] nursing team will not follow, but will remain available to this patient, the nursing and medical teams.  Please re-consult if needed. Thanks, Maudie Flakes, MSN, RN, [place], Arther Abbott  Pager# [phone]

## 2021-07-21 LAB — CBC
HCT: 38.2 % (ref 36.0–46.0)
Hemoglobin: 10.7 g/dL — ABNORMAL LOW (ref 12.0–15.0)
MCH: 24.4 pg — ABNORMAL LOW (ref 26.0–34.0)
MCHC: 28 g/dL — ABNORMAL LOW (ref 30.0–36.0)
MCV: 87.2 fL (ref 80.0–100.0)
Platelets: 290 10*3/uL (ref 150–400)
RBC: 4.38 MIL/uL (ref 3.87–5.11)
RDW: 18 % — ABNORMAL HIGH (ref 11.5–15.5)
WBC: 9.4 10*3/uL (ref 4.0–10.5)
nRBC: 0 % (ref 0.0–0.2)

## 2021-07-21 LAB — BASIC METABOLIC PANEL
Anion gap: 7 (ref 5–15)
BUN: 35 mg/dL — ABNORMAL HIGH (ref 8–23)
CO2: 33 mmol/L — ABNORMAL HIGH (ref 22–32)
Calcium: 8.4 mg/dL — ABNORMAL LOW (ref 8.9–10.3)
Chloride: 99 mmol/L (ref 98–111)
Creatinine, Ser: 1.29 mg/dL — ABNORMAL HIGH (ref 0.44–1.00)
GFR, Estimated: 45 mL/min — ABNORMAL LOW (ref >60)
Glucose, Bld: 78 mg/dL (ref 70–99)
Potassium: 4.1 mmol/L (ref 3.5–5.1)
Sodium: 139 mmol/L (ref 135–145)

## 2021-07-21 LAB — PHOSPHORUS: Phosphorus: 3 mg/dL (ref 2.5–4.6)

## 2021-07-21 LAB — MAGNESIUM: Magnesium: 2.3 mg/dL (ref 1.7–2.4)

## 2021-07-21 MED ORDER — IBUPROFEN 400 MG PO TABS
600.0000 mg | ORAL_TABLET | Freq: Once | ORAL | Status: AC
  Administered 2021-07-21: 600 mg via ORAL
  Filled 2021-07-21: qty 2

## 2021-07-21 NOTE — Progress Notes (Signed)
Vital signs reviewed, ICU needs resolved  Will sign off at this time. No further recommendations at this time.  Please call [phone] for further questions. Thank you.    Darius Fillingim David Jailene Cupit, M.D.  [facility]  Medical Director ICU-[facility] [place] Medical Director [facility] Cardio-Pulmonary Department   

## 2021-07-21 NOTE — TOC Progression Note (Addendum)
Transition of Care [facility]) - Progression Note    Patient Details  Name: Cleo Santucci MRN: [id number] Date of Birth: 21-Jan-1956  Transition of Care [facility]) CM/SW [place], RN Phone Number: 07/21/2021, 1:39 PM  Clinical Narrative: Discussed SNF bed offers with patient and husband and they both prefer [facility], was there before and satisfied with care. Text Tammy, Admissions who replied back that she will look at the request and let me know if they can offer a bed. Husband is aware.   Late Entry, 07/21/21: [facility] declined bed offer. Patient and Husband aware, now requesting [facility] in [place] due to the distance of the four bed offers. TOCRN will follow up with [facility] on 07/22/21.   Expected Discharge Plan: [place] Barriers to Discharge: Continued Medical Work up, SNF Pending bed offer  Expected Discharge Plan and Services Expected Discharge Plan: [place] In-house Referral: Clinical Social Work   Post Acute Care Choice: [place] Living arrangements for the past 2 months: Single Family Home                                       Social Determinants of Health (SDOH) Interventions    Readmission Risk Interventions No flowsheet data found.

## 2021-07-21 NOTE — Progress Notes (Signed)
Physical Therapy Treatment Patient Details Name: Madison Mcbride MRN: [id number] DOB: [birth year] Today's Date: 07/21/2021   History of Present Illness Madison Mcbride is a 66 y.o. female with medical history significant for morbid obesity, OSA on CPAP, heart failure, depression, anxiety, hyperlipidemia, neuropathy, hypothyroid, non-insulin-dependent diabetes mellitus, who presents emergency department for chief concerns of shortness of breath.    PT Comments    Pt is making limited progress towards goals with ability to perform limited exertion there-ex. Pt initially on bipap, removed for therapy and reattached at end of session. O2 sats WNL while on O2 cannula. Pt lethargic but awake. Not safe to perform OOB mobility at this time. Will continue to progress.  Recommendations for follow up therapy are one component of a multi-disciplinary discharge planning process, led by the attending physician.  Recommendations may be updated based on patient status, additional functional criteria and insurance authorization.  Follow Up Recommendations  Skilled nursing-short term rehab (<3 hours/day)     Assistance Recommended at Discharge Frequent or constant Supervision/Assistance  Patient can return home with the following Two people to help with walking and/or transfers;A lot of help with bathing/dressing/bathroom;Assistance with cooking/housework   Equipment Recommendations  None recommended by PT    Recommendations for Other Services       Precautions / Restrictions Precautions Precautions: Fall Restrictions Weight Bearing Restrictions: No     Mobility  Bed Mobility               General bed mobility comments: NT due to bipap and lethargy    Transfers                        Ambulation/Gait                   Stairs             Wheelchair Mobility    Modified Rankin (Stroke Patients Only)       Balance                                             Cognition Arousal/Alertness: Lethargic Behavior During Therapy: WFL for tasks assessed/performed Overall Cognitive Status: Impaired/Different from baseline                                 General Comments: initially sleeping, however easily arousable. Pt very pleasant however seems confused to situation.        Exercises Other Exercises Other Exercises: supine ther-ex performed on B UE/LE including AP, SLR, hip abd/add, heel slides, resisted elbow extension, shoulder adduction, and shoulder press. Needs mod/max assist for performance. O2 sats at 93-95% with exertion    General Comments        Pertinent Vitals/Pain Pain Assessment: No/denies pain    Home Living                          Prior Function            PT Goals (current goals can now be found in the care plan section) Acute Rehab PT Goals Patient Stated Goal: to return home PT Goal Formulation: With patient Time For Goal Achievement: 08/02/21 Potential to Achieve Goals: Fair Progress towards PT goals: Progressing toward goals  Frequency    Min 2X/week      PT Plan Current plan remains appropriate    Co-evaluation              AM-PAC PT "6 Clicks" Mobility   Outcome Measure  Help needed turning from your back to your side while in a flat bed without using bedrails?: A Lot Help needed moving from lying on your back to sitting on the side of a flat bed without using bedrails?: A Lot Help needed moving to and from a bed to a chair (including a wheelchair)?: A Lot Help needed standing up from a chair using your arms (e.g., wheelchair or bedside chair)?: A Lot Help needed to walk in hospital room?: Total Help needed climbing 3-5 steps with a railing? : Total 6 Click Score: 10    End of Session Equipment Utilized During Treatment: Oxygen Activity Tolerance: Patient limited by lethargy;Treatment limited secondary to medical complications  (Comment) Patient left: in bed;with call bell/phone within reach;with family/visitor present Nurse Communication: Mobility status PT Visit Diagnosis: Muscle weakness (generalized) (M62.81);Other abnormalities of gait and mobility (R26.89);Unsteadiness on feet (R26.81)     Time: 6203-5597 PT Time Calculation (min) (ACUTE ONLY): 15 min  Charges:  $Therapeutic Exercise: 8-22 mins                     Greggory Stallion, PT, DPT [phone]    Madison Mcbride 07/21/2021, 1:30 PM

## 2021-07-21 NOTE — Progress Notes (Signed)
[facility]  HPI: Patient is a 66 year old female with a past medical history of HFpEF (LVEF 45-50% 03/2021), OSA on CPAP, hyperlipidemia, type 2 diabetes who presented to [facility] ED 07/15/2021 with complaints of shortness of breath.  Cardiology is consulted for further management of her heart failure.  Interval history: -Patient examined sitting upright in bed after eating breakfast, husband at bedside -She became slightly bradycardic yesterday afternoon and carvedilol was held.  DVT was ruled out by ultrasound.  She is on nasal cannula at the time of interview and denies shortness of breath, chest pain -Her only complaint is some soreness in her fingertips she attributes to her arthritis.   Vitals:   07/21/21 0400 07/21/21 0500 07/21/21 0600 07/21/21 0712  BP: (!) 149/65 139/68 (!) 146/77 123/60  Pulse: (!) 56 (!) 57 63 63  Resp: 17 17 (!) 21 16  Temp: 99.2 F (37.3 C)   98 F (36.7 C)  TempSrc: Axillary   Oral  SpO2: 90% 95% (!) 88% 93%  Weight:  (!) 154.8 kg    Height:         Intake/Output Summary (Last 24 hours) at 07/21/2021 1000 Last data filed at 07/21/2021 7341 Gross per 24 hour  Intake --  Output 2400 ml  Net -2400 ml    PHYSICAL EXAM General: Pleasant caucasian female, well nourished, in no acute distress. Husband at bedside. On 4L by [place] HEENT:  Normocephalic and atraumatic. Neck:   No JVD.  Lungs: Normal respiratory effort on . clear bilaterally to auscultation anteriorly Heart: HRRR . Normal S1 and S2.  Radial pulses 2+ bilaterally. Abdomen: Obese appearing.  Msk: Normal strength and tone for age. Extremities: 1+ bilateral LEE wrapped in Kerlix and ABD pads covering her chronic venous stasis dermatitis Neuro: Alert and oriented X 3. Psych:  Mood appropriate, affect congruent.    LABS: Basic Metabolic Panel: Recent Labs    07/20/21 0309 07/21/21 0454  NA 139 139  K 4.4 4.1  CL 99 99  CO2 32 33*  GLUCOSE 81 78  BUN 45* 35*  CREATININE 1.52*  1.29*  CALCIUM 8.2* 8.4*  MG 2.3 2.3  PHOS 4.2 3.0    Liver Function Tests: Recent Labs    07/19/21 0439  AST 71*  ALT 68*  ALKPHOS 108  BILITOT 1.2  PROT 6.7  ALBUMIN 3.0*    No results for input(s): LIPASE, AMYLASE in the last 72 hours. CBC: Recent Labs    07/20/21 0309 07/21/21 0454  WBC 9.5 9.4  HGB 10.9* 10.7*  HCT 37.8 38.2  MCV 87.5 87.2  PLT 318 290    Cardiac Enzymes: No results for input(s): CKTOTAL, CKMB, CKMBINDEX, TROPONINI in the last 72 hours. BNP: Invalid input(s): POCBNP D-Dimer: No results for input(s): DDIMER in the last 72 hours. Hemoglobin A1C: No results for input(s): HGBA1C in the last 72 hours. Fasting Lipid Panel: No results for input(s): CHOL, HDL, LDLCALC, TRIG, CHOLHDL, LDLDIRECT in the last 72 hours. Thyroid Function Tests: No results for input(s): TSH, T4TOTAL, T3FREE, THYROIDAB in the last 72 hours.  Invalid input(s): FREET3 Anemia Panel: No results for input(s): VITAMINB12, FOLATE, FERRITIN, TIBC, IRON, RETICCTPCT in the last 72 hours.  US Venous Img Lower Bilateral (DVT)  Result Date: 07/20/2021 CLINICAL DATA:  Lower extremity edema EXAM: BILATERAL LOWER EXTREMITY VENOUS DOPPLER ULTRASOUND TECHNIQUE: Gray-scale sonography with graded compression, as well as color Doppler and duplex ultrasound were performed to evaluate the lower extremity deep venous systems from the level of  the common femoral vein and including the common femoral, femoral, profunda femoral, popliteal and calf veins including the posterior tibial, peroneal and gastrocnemius veins when visible. The superficial great saphenous vein was also interrogated. Spectral Doppler was utilized to evaluate flow at rest and with distal augmentation maneuvers in the common femoral, femoral and popliteal veins. COMPARISON:  None. FINDINGS: RIGHT LOWER EXTREMITY Common Femoral Vein: No evidence of thrombus. Normal compressibility, respiratory phasicity and response to augmentation.  Saphenofemoral Junction: No evidence of thrombus. Normal compressibility and flow on color Doppler imaging. Profunda Femoral Vein: No evidence of thrombus. Normal compressibility and flow on color Doppler imaging. Femoral Vein: No evidence of thrombus. Normal compressibility, respiratory phasicity and response to augmentation. Popliteal Vein: No evidence of thrombus. Normal compressibility, respiratory phasicity and response to augmentation. Calf Veins: Unable to visualize the calf veins secondary to patient body habitus. Superficial Great Saphenous Vein: No evidence of thrombus. Normal compressibility. Venous Reflux:  None. Other Findings:  None. LEFT LOWER EXTREMITY Common Femoral Vein: No evidence of thrombus. Normal compressibility, respiratory phasicity and response to augmentation. Saphenofemoral Junction: No evidence of thrombus. Normal compressibility and flow on color Doppler imaging. Profunda Femoral Vein: No evidence of thrombus. Normal compressibility and flow on color Doppler imaging. Femoral Vein: No evidence of thrombus. Normal compressibility, respiratory phasicity and response to augmentation. Popliteal Vein: No evidence of thrombus. Normal compressibility, respiratory phasicity and response to augmentation. Calf Veins: Unable to visualize the calf veins secondary to patient body habitus. Superficial Great Saphenous Vein: No evidence of thrombus. Normal compressibility. Venous Reflux:  None. Other Findings:  None. IMPRESSION: Technically challenging and limited examination secondary to patient body habitus. No evidence of deep venous thrombosis to the level of the knees bilaterally. The calf veins are not well seen in either lower extremity. Electronically Signed   By: Jacqulynn Cadet M.D.   On: 07/20/2021 06:31     Echo LVEF 45 to 50% 03/16/2021  TELEMETRY: Sinus rhythm:  ASSESSMENT AND PLAN:  Principal Problem:   Acute exacerbation of CHF (congestive heart failure) (HCC) Active  Problems:   Hypothyroidism   Morbid obesity with BMI of 50.0-59.9, adult (HCC)   Acute respiratory failure with hypoxia and hypercapnia (HCC)   HLD (hyperlipidemia)   Depression   OSA (obstructive sleep apnea)   Acute hypoxemic respiratory failure (HCC)   Benign essential hypertension   Stage 3a chronic kidney disease (HCC)    #Chronic diastolic congestive heart failure, HFpEF (LVEF 40-50% on 03/16/2021) - furosemide 40 mg IV daily, will transition back to home torsemide 40mg  PO once daily at discharge - carvedilol 3.125 mg twice daily, last nights dose held due to bradycardia, HR remains labile since. Holding carvedilol for now.  -BP low normal during hospitalization, will consider escalation of GDMT (ACEi) as BP permits  -no further cardiac diagnostics at this time. Follow up with cardiologist outpatient (Dr. Nehemiah Massed) in 1-2 weeks.  -expected dispo is to a SNF  #Acute hypercarbic and hypoxemic respiratory failure,  #OSA on CPAP -clinically improving, off BIPAP during the day and on 3L by [place] during interview -agree with outpatient bariatric surgery referral   #Essential hypertension -BP has been low normal during her hospitalization, but also has not be OOB/tolerating much activity so far due to her intermittent bipap requirements -encourage participation with PT/OT and consider discontinuing midodrine 2.5 TID   #Elevated D-dimer -no evidence of pulmonary embolism by VQ scan, no DVT by doppler U/S  #Bilateral lower extremity venous stasis dermatitis -improving, would care  as needed   #AKI on CKD, resolved -Creatinine back to baseline ~1.29  #type 2 diabetes As per primary team, consider initiation of SGLT2i on an outpatient basis. Appears pt was on a GLP1 in the past.   Tristan Schroeder, PA-C 07/21/2021 10:00 AM

## 2021-07-21 NOTE — Progress Notes (Addendum)
[facility]  PROGRESS NOTE  Madison Mcbride XBW:[id number] DOB: Apr 11, 1956 DOA: 07/15/2021 PCP: Rusty Aus, MD   Brief HPI:   65 year old female with medical history of morbid obesity, OSA on CPAP, CHF, depression, anxiety, hyperlipidemia, neuropathy, diabetes mellitus type 2 presented to the ED with complaints of shortness of breath.  As per patient she forgot to take her fluid medications for past 2 days.  She does not always wear her BiPAP machine at night.  Denies chest pain or nausea vomiting or diarrhea.  In the hospital patient requiring oxygen however at home she does not feel her oxygen. In the ED initially SPO2 was 85% on room air improved to 97% on 2 L of oxygen via nasal cannula.  Patient received furosemide 60 mg IV in the ED. ABG showed PCO2 of 76, pH 7.21.  Pulmonology was consulted, patient placed on BiPAP.  This morning patient is alert, oriented x3.  Denies shortness of breath.  Cardiology also was consulted for guidance regarding diuresis for CHF    Subjective   Reports feeling a good bit better. No confusion, breathing comfortably, no chest pain   Assessment/Plan:    Acute hypercarbic and hypoxemic respiratory failure HPEF Secondary to CHF exacerbation, OSA, OHS. Has been treated with bipap, now off that during the day. Vq scan not suggestive of PE - cont bipap qhs - diuresis lasix 40 iv qd - cont [place] o2, wean as able - have discontinued BB as yesterday and today HR down to 40s, asymptomatic  Debility - snf bed search underway  Acute kidney injury on CKD stage III Resolved w/ diuresis - cont diuresis  Venous stasis dermatitis Physical exam consistent with chronic venous stasis, no signs cellulitis - have stopped ceftriaxone - ted hose  OSA/OHS -Continue BiPAP qhs -Pulmonology has signed off - outpatient bariatric surgery referral  Hyperlipidemia -Continue atorvastatin 20 mg daily  Depression/anxiety -Continue bupropion  Restless leg  syndrome -Continue ropinirole  Neuropathy -Continue Requip - gabapentin on hold 2/2 episodes encephalopathy  Hyperkalemia Resolved - monitor  T2DM Daily fastings wnl - monitor   Medications     atorvastatin  20 mg Oral Q2200   buPROPion  150 mg Oral Daily   carvedilol  3.125 mg Oral BID WC   Chlorhexidine Gluconate Cloth  6 each Topical Daily   fluticasone  1 spray Each Nare Daily   furosemide  40 mg Intravenous Daily   gabapentin  300 mg Oral Q1200   heparin  5,000 Units Subcutaneous Q8H   hydrocerin   Topical Daily   levothyroxine  125 mcg Oral Daily   midodrine  2.5 mg Oral TID WC   montelukast  10 mg Oral Daily   pantoprazole  40 mg Oral Daily   rOPINIRole  3 mg Oral TID   sodium chloride flush  3 mL Intravenous Q12H     Data Reviewed:   CBG:  Recent Labs  Lab 07/18/21 0524  GLUCAP 89    SpO2: 91 % O2 Flow Rate (L/min): 3 L/min FiO2 (%): 30 %    Vitals:   07/21/21 0712 07/21/21 0827 07/21/21 0900 07/21/21 1000  BP: 123/60  (!) 105/58 (!) 114/54  Pulse: 63  62 61  Resp: 16  16 18   Temp: 98 F (36.7 C) 98.7 F (37.1 C)    TempSrc: Oral Oral    SpO2: 93%  96% 91%  Weight:      Height:         Intake/Output Summary (  Last 24 hours) at 07/21/2021 1111 Last data filed at 07/21/2021 2878 Gross per 24 hour  Intake --  Output 2400 ml  Net -2400 ml    01/07 1901 - 01/09 0700 In: -  Out: 2600 [Urine:2600]  Filed Weights   07/19/21 0312 07/20/21 0500 07/21/21 0500  Weight: (!) 156.4 kg (!) 151.4 kg (!) 154.8 kg    Data Reviewed: Basic Metabolic Panel: Recent Labs  Lab 07/17/21 0706 07/18/21 0535 07/19/21 0439 07/20/21 0309 07/21/21 0454  NA 138 138 139 139 139  K 5.5* 4.8 4.5 4.4 4.1  CL 100 98 100 99 99  CO2 28 31 31  32 33*  GLUCOSE 86 85 90 81 78  BUN 48* 52* 51* 45* 35*  CREATININE 2.23* 2.16* 1.97* 1.52* 1.29*  CALCIUM 7.9* 8.0* 7.9* 8.2* 8.4*  MG  --   --  2.2 2.3 2.3  PHOS  --   --  5.2* 4.2 3.0   Liver Function  Tests: Recent Labs  Lab 07/19/21 0439  AST 71*  ALT 68*  ALKPHOS 108  BILITOT 1.2  PROT 6.7  ALBUMIN 3.0*   No results for input(s): LIPASE, AMYLASE in the last 168 hours. No results for input(s): AMMONIA in the last 168 hours. CBC: Recent Labs  Lab 07/15/21 2147 07/16/21 0712 07/19/21 0439 07/20/21 0309 07/21/21 0454  WBC 10.5 10.1 11.6* 9.5 9.4  HGB 12.2 11.9* 11.4* 10.9* 10.7*  HCT 42.6 41.9 40.2 37.8 38.2  MCV 88.6 89.3 87.0 87.5 87.2  PLT 426* 378 325 318 290   Cardiac Enzymes: No results for input(s): CKTOTAL, CKMB, CKMBINDEX, TROPONINI in the last 168 hours. BNP (last 3 results) Recent Labs    03/14/21 1817 03/16/21 0819 07/15/21 2147  BNP 1,805.1* 1,352.0* 1,583.1*    ProBNP (last 3 results) No results for input(s): PROBNP in the last 8760 hours.  CBG: Recent Labs  Lab 07/18/21 0524  GLUCAP 89       Radiology Reports  US Venous Img Lower Bilateral (DVT)  Result Date: 07/20/2021 CLINICAL DATA:  Lower extremity edema EXAM: BILATERAL LOWER EXTREMITY VENOUS DOPPLER ULTRASOUND TECHNIQUE: Gray-scale sonography with graded compression, as well as color Doppler and duplex ultrasound were performed to evaluate the lower extremity deep venous systems from the level of the common femoral vein and including the common femoral, femoral, profunda femoral, popliteal and calf veins including the posterior tibial, peroneal and gastrocnemius veins when visible. The superficial great saphenous vein was also interrogated. Spectral Doppler was utilized to evaluate flow at rest and with distal augmentation maneuvers in the common femoral, femoral and popliteal veins. COMPARISON:  None. FINDINGS: RIGHT LOWER EXTREMITY Common Femoral Vein: No evidence of thrombus. Normal compressibility, respiratory phasicity and response to augmentation. Saphenofemoral Junction: No evidence of thrombus. Normal compressibility and flow on color Doppler imaging. Profunda Femoral Vein: No evidence  of thrombus. Normal compressibility and flow on color Doppler imaging. Femoral Vein: No evidence of thrombus. Normal compressibility, respiratory phasicity and response to augmentation. Popliteal Vein: No evidence of thrombus. Normal compressibility, respiratory phasicity and response to augmentation. Calf Veins: Unable to visualize the calf veins secondary to patient body habitus. Superficial Great Saphenous Vein: No evidence of thrombus. Normal compressibility. Venous Reflux:  None. Other Findings:  None. LEFT LOWER EXTREMITY Common Femoral Vein: No evidence of thrombus. Normal compressibility, respiratory phasicity and response to augmentation. Saphenofemoral Junction: No evidence of thrombus. Normal compressibility and flow on color Doppler imaging. Profunda Femoral Vein: No evidence of thrombus. Normal compressibility and  flow on color Doppler imaging. Femoral Vein: No evidence of thrombus. Normal compressibility, respiratory phasicity and response to augmentation. Popliteal Vein: No evidence of thrombus. Normal compressibility, respiratory phasicity and response to augmentation. Calf Veins: Unable to visualize the calf veins secondary to patient body habitus. Superficial Great Saphenous Vein: No evidence of thrombus. Normal compressibility. Venous Reflux:  None. Other Findings:  None. IMPRESSION: Technically challenging and limited examination secondary to patient body habitus. No evidence of deep venous thrombosis to the level of the knees bilaterally. The calf veins are not well seen in either lower extremity. Electronically Signed   By: Jacqulynn Cadet M.D.   On: 07/20/2021 06:31       Antibiotics: Anti-infectives (From admission, onward)    Start     Dose/Rate Route Frequency Ordered Stop   07/17/21 0900  cefTRIAXone (ROCEPHIN) 2 g in sodium chloride 0.9 % 100 mL IVPB  Status:  Discontinued        2 g 200 mL/hr over 30 Minutes Intravenous Every 24 hours 07/17/21 0850 07/19/21 1013          DVT prophylaxis:   Code Status: Full code  Family Communication: No family at bedside   Consultants:   Procedures:     Objective    Physical Examination:  General-appears in no acute distress Heart-S1-S2, regular, no murmur auscultated Lungs-decreased breath sounds bilaterally Abdomen-soft, nontender, no organomegaly Extremities-1+  edema in the lower extremities, venous stasis changes Neuro-alert, oriented x3, no focal deficit noted  Status is: Inpatient  Dispo: The patient is from: Home              Anticipated d/c is to: snf              Anticipated d/c date is: tbd              Patient currently stable for discharge  Barrier to discharge-unsafe d/c plan   No results for input(s): DDIMER, FERRITIN, LDH, CRP in the last 72 hours.   Lab Results  Component Value Date   SARSCOV2NAA NEGATIVE 07/15/2021   SARSCOV2NAA NEGATIVE 03/24/2021   [place] NEGATIVE 03/14/2021   [place] NEGATIVE 04/17/2020            Recent Results (from the past 240 hour(s))  Resp Panel by RT-PCR (Flu A&B, Covid) Nasopharyngeal Swab     Status: None   Collection Time: 07/15/21 10:29 PM   Specimen: Nasopharyngeal Swab; Nasopharyngeal(NP) swabs in vial transport medium  Result Value Ref Range Status   SARS Coronavirus 2 by RT PCR NEGATIVE NEGATIVE Final    Comment: (NOTE) SARS-CoV-2 target nucleic acids are NOT DETECTED.  The SARS-CoV-2 RNA is generally detectable in upper respiratory specimens during the acute phase of infection. The lowest concentration of SARS-CoV-2 viral copies this assay can detect is 138 copies/mL. A negative result does not preclude SARS-Cov-2 infection and should not be used as the sole basis for treatment or other patient management decisions. A negative result may occur with  improper specimen collection/handling, submission of specimen other than nasopharyngeal swab, presence of viral mutation(s) within the areas targeted by this  assay, and inadequate number of viral copies(<138 copies/mL). A negative result must be combined with clinical observations, patient history, and epidemiological information. The expected result is Negative.  Fact Sheet for Patients:  EntrepreneurPulse.com.au  Fact Sheet for Healthcare Providers:  IncredibleEmployment.be  This test is no t yet approved or cleared by the [place] FDA and  has been authorized for detection and/or diagnosis  of SARS-CoV-2 by FDA under an Emergency Use Authorization (EUA). This EUA will remain  in effect (meaning this test can be used) for the duration of the COVID-19 declaration under Section 564(b)(1) of the Act, 21 U.S.C.section 360bbb-3(b)(1), unless the authorization is terminated  or revoked sooner.       Influenza A by PCR NEGATIVE NEGATIVE Final   Influenza B by PCR NEGATIVE NEGATIVE Final    Comment: (NOTE) The Xpert Xpress SARS-CoV-2/FLU/RSV plus assay is intended as an aid in the diagnosis of influenza from Nasopharyngeal swab specimens and should not be used as a sole basis for treatment. Nasal washings and aspirates are unacceptable for Xpert Xpress SARS-CoV-2/FLU/RSV testing.  Fact Sheet for Patients: EntrepreneurPulse.com.au  Fact Sheet for Healthcare Providers: IncredibleEmployment.be  This test is not yet approved or cleared by the [place] FDA and has been authorized for detection and/or diagnosis of SARS-CoV-2 by FDA under an Emergency Use Authorization (EUA). This EUA will remain in effect (meaning this test can be used) for the duration of the COVID-19 declaration under Section 564(b)(1) of the Act, 21 U.S.C. section 360bbb-3(b)(1), unless the authorization is terminated or revoked.  Performed at [facility], [place], [place] [address]   Respiratory (~20 pathogens) panel by PCR     Status: None   Collection Time:  07/16/21  9:40 PM   Specimen: Nasopharyngeal Swab; Respiratory  Result Value Ref Range Status   Adenovirus NOT DETECTED NOT DETECTED Final   Coronavirus 229E NOT DETECTED NOT DETECTED Final    Comment: (NOTE) The Coronavirus on the Respiratory Panel, DOES NOT test for the novel  Coronavirus (2019 nCoV)    Coronavirus HKU1 NOT DETECTED NOT DETECTED Final   Coronavirus NL63 NOT DETECTED NOT DETECTED Final   Coronavirus OC43 NOT DETECTED NOT DETECTED Final   Metapneumovirus NOT DETECTED NOT DETECTED Final   Rhinovirus / Enterovirus NOT DETECTED NOT DETECTED Final   Influenza A NOT DETECTED NOT DETECTED Final   Influenza B NOT DETECTED NOT DETECTED Final   Parainfluenza Virus 1 NOT DETECTED NOT DETECTED Final   Parainfluenza Virus 2 NOT DETECTED NOT DETECTED Final   Parainfluenza Virus 3 NOT DETECTED NOT DETECTED Final   Parainfluenza Virus 4 NOT DETECTED NOT DETECTED Final   Respiratory Syncytial Virus NOT DETECTED NOT DETECTED Final   Bordetella pertussis NOT DETECTED NOT DETECTED Final   Bordetella Parapertussis NOT DETECTED NOT DETECTED Final   Chlamydophila pneumoniae NOT DETECTED NOT DETECTED Final   Mycoplasma pneumoniae NOT DETECTED NOT DETECTED Final    Comment: Performed at [facility], [place]. [address], [place], [place] [address]  MRSA Next Gen by PCR, Nasal     Status: None   Collection Time: 07/18/21  4:52 AM   Specimen: Nasal Mucosa; Nasal Swab  Result Value Ref Range Status   MRSA by PCR Next Gen NOT DETECTED NOT DETECTED Final    Comment: (NOTE) The GeneXpert MRSA Assay (FDA approved for NASAL specimens only), is one component of a comprehensive MRSA colonization surveillance program. It is not intended to diagnose MRSA infection nor to guide or monitor treatment for MRSA infections. Test performance is not FDA approved in patients less than 79 years old. Performed at [facility], [address] [place],  [address]     [facility] If 7PM-7AM, please contact night-coverage at [email or web address], Office  [phone]   07/21/2021, 11:11 AM  LOS: 4 days

## 2021-07-21 NOTE — Progress Notes (Addendum)
Patient alert with intermittent confusion. Patient came off Bipap this morning at 0600, placed back on Bipap at 1130 due to confusion, lethargy, and slurred speech. Patient heart rate dropped and sustained in mid 40s. MD made aware. Patient on 3L [place] at 1600, alert and oriented at that time. Patient voice is starting to sound a little slurred, patient is oriented. Reported to night shift nurse.

## 2021-07-22 DIAGNOSIS — Z7189 Other specified counseling: Secondary | ICD-10-CM

## 2021-07-22 LAB — BASIC METABOLIC PANEL
Anion gap: 8 (ref 5–15)
BUN: 30 mg/dL — ABNORMAL HIGH (ref 8–23)
CO2: 34 mmol/L — ABNORMAL HIGH (ref 22–32)
Calcium: 8.3 mg/dL — ABNORMAL LOW (ref 8.9–10.3)
Chloride: 96 mmol/L — ABNORMAL LOW (ref 98–111)
Creatinine, Ser: 1.25 mg/dL — ABNORMAL HIGH (ref 0.44–1.00)
GFR, Estimated: 47 mL/min — ABNORMAL LOW (ref >60)
Glucose, Bld: 78 mg/dL (ref 70–99)
Potassium: 3.9 mmol/L (ref 3.5–5.1)
Sodium: 138 mmol/L (ref 135–145)

## 2021-07-22 MED ORDER — ENOXAPARIN SODIUM 80 MG/0.8ML IJ SOSY
0.5000 mg/kg | PREFILLED_SYRINGE | INTRAMUSCULAR | Status: DC
  Administered 2021-07-23 – 2021-07-25 (×3): 77.5 mg via SUBCUTANEOUS
  Filled 2021-07-22 (×3): qty 0.8

## 2021-07-22 MED ORDER — TORSEMIDE 20 MG PO TABS
40.0000 mg | ORAL_TABLET | Freq: Every day | ORAL | Status: DC
  Administered 2021-07-23 – 2021-07-29 (×7): 40 mg via ORAL
  Filled 2021-07-22 (×7): qty 2

## 2021-07-22 MED ORDER — HEPARIN SODIUM (PORCINE) 5000 UNIT/ML IJ SOLN
5000.0000 [IU] | Freq: Three times a day (TID) | INTRAMUSCULAR | Status: AC
  Administered 2021-07-22 (×2): 5000 [IU] via SUBCUTANEOUS
  Filled 2021-07-22 (×2): qty 1

## 2021-07-22 MED ORDER — GABAPENTIN 100 MG PO CAPS
100.0000 mg | ORAL_CAPSULE | Freq: Every day | ORAL | Status: DC
  Administered 2021-07-22 – 2021-07-26 (×5): 100 mg via ORAL
  Filled 2021-07-22 (×5): qty 1

## 2021-07-22 MED ORDER — ENOXAPARIN SODIUM 40 MG/0.4ML IJ SOSY: 40.0000 mg | PREFILLED_SYRINGE | INTRAMUSCULAR | Status: DC

## 2021-07-22 MED ORDER — DOCUSATE SODIUM 100 MG PO CAPS
100.0000 mg | ORAL_CAPSULE | Freq: Every day | ORAL | Status: DC
  Administered 2021-07-22 – 2021-07-29 (×7): 100 mg via ORAL
  Filled 2021-07-22 (×8): qty 1

## 2021-07-22 NOTE — Progress Notes (Addendum)
[facility]  PROGRESS NOTE  Madison Mcbride PPJ:[id number] DOB: [birth year] DOA: 07/15/2021 PCP: Rusty Aus, MD   Brief HPI:   66 year old female with medical history of morbid obesity, OSA on CPAP, CHF, depression, anxiety, hyperlipidemia, neuropathy, diabetes mellitus type 2 presented to the ED with complaints of shortness of breath.  As per patient she forgot to take her fluid medications for past 2 days.  She does not always wear her BiPAP machine at night.  Denies chest pain or nausea vomiting or diarrhea.  In the hospital patient requiring oxygen however at home she does not feel her oxygen. In the ED initially SPO2 was 85% on room air improved to 97% on 2 L of oxygen via nasal cannula.  Patient received furosemide 60 mg IV in the ED. ABG showed PCO2 of 76, pH 7.21.  Pulmonology was consulted, patient placed on BiPAP.  This morning patient is alert, oriented x3.  Denies shortness of breath.  Cardiology also was consulted for guidance regarding diuresis for CHF    Subjective   Reports feeling OK. No confusion, breathing comfortably, no chest pain   Assessment/Plan:    Acute hypercarbic and hypoxemic respiratory failure HPEF Secondary to CHF exacerbation, OSA, OHS. Has been treated with bipap, now off that during the day. Vq scan not suggestive of PE. Out 2 L yesterday - cont bipap qhs and as needed - diuresis lasix 40 iv qd, continue - cardiology has added midodrine tid - cont [place] o2, wean as able - have discontinued BB 2/2 bradycrdia  Debility - snf bed search underway  Acute kidney injury on CKD stage III Resolved w/ diuresis - cont diuresis  Venous stasis dermatitis Physical exam consistent with chronic venous stasis, no signs cellulitis - have stopped ceftriaxone - ted hose  OSA/OHS - Continue BiPAP qhs and prn - Pulmonology has signed off - outpatient bariatric surgery referral  Hyperlipidemia -Continue atorvastatin 20 mg  daily  Depression/anxiety -Continue bupropion  Restless leg syndrome -Continue ropinirole  Neuropathy -Continue Requip - de-escalate gabapentin to 100, will try weaning off as can contribute to OHS  Hyperkalemia Resolved - monitor  T2DM Daily fastings wnl - monitor   Medications     atorvastatin  20 mg Oral Q2200   buPROPion  150 mg Oral Daily   Chlorhexidine Gluconate Cloth  6 each Topical Daily   fluticasone  1 spray Each Nare Daily   furosemide  40 mg Intravenous Daily   gabapentin  300 mg Oral Q1200   heparin  5,000 Units Subcutaneous Q8H   hydrocerin   Topical Daily   levothyroxine  125 mcg Oral Daily   midodrine  2.5 mg Oral TID WC   montelukast  10 mg Oral Daily   pantoprazole  40 mg Oral Daily   rOPINIRole  3 mg Oral TID   sodium chloride flush  3 mL Intravenous Q12H     Data Reviewed:   CBG:  Recent Labs  Lab 07/18/21 0524  GLUCAP 89    SpO2: 98 % O2 Flow Rate (L/min): 3 L/min FiO2 (%): 30 %    Vitals:   07/22/21 0500 07/22/21 0600 07/22/21 0700 07/22/21 0800  BP: (!) 117/51 119/63 (!) 93/44 114/62  Pulse: (!) 54 (!) 51 (!) 54 60  Resp: 16 18 16 18   Temp:    98.7 F (37.1 C)  TempSrc:    Oral  SpO2: 97% 95% 95% 98%  Weight: (!) 156 kg     Height:  Intake/Output Summary (Last 24 hours) at 07/22/2021 0954 Last data filed at 07/22/2021 0600 Gross per 24 hour  Intake 243 ml  Output 1650 ml  Net -1407 ml    01/08 1901 - 01/10 0700 In: 483 [P.O.:480; I.V.:3] Out: 2350 [Urine:2350]  Filed Weights   07/20/21 0500 07/21/21 0500 07/22/21 0500  Weight: (!) 151.4 kg (!) 154.8 kg (!) 156 kg    Data Reviewed: Basic Metabolic Panel: Recent Labs  Lab 07/18/21 0535 07/19/21 0439 07/20/21 0309 07/21/21 0454 07/22/21 0426  NA 138 139 139 139 138  K 4.8 4.5 4.4 4.1 3.9  CL 98 100 99 99 96*  CO2 31 31 32 33* 34*  GLUCOSE 85 90 81 78 78  BUN 52* 51* 45* 35* 30*  CREATININE 2.16* 1.97* 1.52* 1.29* 1.25*  CALCIUM 8.0* 7.9*  8.2* 8.4* 8.3*  MG  --  2.2 2.3 2.3  --   PHOS  --  5.2* 4.2 3.0  --    Liver Function Tests: Recent Labs  Lab 07/19/21 0439  AST 71*  ALT 68*  ALKPHOS 108  BILITOT 1.2  PROT 6.7  ALBUMIN 3.0*   No results for input(s): LIPASE, AMYLASE in the last 168 hours. No results for input(s): AMMONIA in the last 168 hours. CBC: Recent Labs  Lab 07/15/21 2147 07/16/21 0712 07/19/21 0439 07/20/21 0309 07/21/21 0454  WBC 10.5 10.1 11.6* 9.5 9.4  HGB 12.2 11.9* 11.4* 10.9* 10.7*  HCT 42.6 41.9 40.2 37.8 38.2  MCV 88.6 89.3 87.0 87.5 87.2  PLT 426* 378 325 318 290   Cardiac Enzymes: No results for input(s): CKTOTAL, CKMB, CKMBINDEX, TROPONINI in the last 168 hours. BNP (last 3 results) Recent Labs    03/14/21 1817 03/16/21 0819 07/15/21 2147  BNP 1,805.1* 1,352.0* 1,583.1*    ProBNP (last 3 results) No results for input(s): PROBNP in the last 8760 hours.  CBG: Recent Labs  Lab 07/18/21 2633  HLKTGY 56       Radiology Reports  No results found.     Antibiotics: Anti-infectives (From admission, onward)    Start     Dose/Rate Route Frequency Ordered Stop   07/17/21 0900  cefTRIAXone (ROCEPHIN) 2 g in sodium chloride 0.9 % 100 mL IVPB  Status:  Discontinued        2 g 200 mL/hr over 30 Minutes Intravenous Every 24 hours 07/17/21 0850 07/19/21 1013         DVT prophylaxis:   Code Status: Full code  Family Communication: No family at bedside   Consultants:   Procedures:     Objective    Physical Examination:  General-appears in no acute distress Heart-S1-S2, regular, no murmur auscultated Lungs-decreased breath sounds bilaterally Abdomen-soft, nontender, no organomegaly Extremities-1+  edema in the lower extremities, venous stasis changes Neuro-alert, oriented x3, no focal deficit noted  Status is: Inpatient  Dispo: The patient is from: Home              Anticipated d/c is to: snf              Anticipated d/c date is: tbd               Patient currently stable for discharge  Barrier to discharge-unsafe d/c plan   No results for input(s): DDIMER, FERRITIN, LDH, CRP in the last 72 hours.   Lab Results  Component Value Date   [place] NEGATIVE 07/15/2021   [place] NEGATIVE 03/24/2021   [place] NEGATIVE 03/14/2021   SARSCOV2NAA  NEGATIVE 04/17/2020            Recent Results (from the past 240 hour(s))  Resp Panel by RT-PCR (Flu A&B, Covid) Nasopharyngeal Swab     Status: None   Collection Time: 07/15/21 10:29 PM   Specimen: Nasopharyngeal Swab; Nasopharyngeal(NP) swabs in vial transport medium  Result Value Ref Range Status   SARS Coronavirus 2 by RT PCR NEGATIVE NEGATIVE Final    Comment: (NOTE) SARS-CoV-2 target nucleic acids are NOT DETECTED.  The SARS-CoV-2 RNA is generally detectable in upper respiratory specimens during the acute phase of infection. The lowest concentration of SARS-CoV-2 viral copies this assay can detect is 138 copies/mL. A negative result does not preclude SARS-Cov-2 infection and should not be used as the sole basis for treatment or other patient management decisions. A negative result may occur with  improper specimen collection/handling, submission of specimen other than nasopharyngeal swab, presence of viral mutation(s) within the areas targeted by this assay, and inadequate number of viral copies(<138 copies/mL). A negative result must be combined with clinical observations, patient history, and epidemiological information. The expected result is Negative.  Fact Sheet for Patients:  EntrepreneurPulse.com.au  Fact Sheet for Healthcare Providers:  IncredibleEmployment.be  This test is no t yet approved or cleared by the [place] FDA and  has been authorized for detection and/or diagnosis of SARS-CoV-2 by FDA under an Emergency Use Authorization (EUA). This EUA will remain  in effect (meaning this test can be used) for  the duration of the COVID-19 declaration under Section 564(b)(1) of the Act, 21 U.S.C.section 360bbb-3(b)(1), unless the authorization is terminated  or revoked sooner.       Influenza A by PCR NEGATIVE NEGATIVE Final   Influenza B by PCR NEGATIVE NEGATIVE Final    Comment: (NOTE) The Xpert Xpress SARS-CoV-2/FLU/RSV plus assay is intended as an aid in the diagnosis of influenza from Nasopharyngeal swab specimens and should not be used as a sole basis for treatment. Nasal washings and aspirates are unacceptable for Xpert Xpress SARS-CoV-2/FLU/RSV testing.  Fact Sheet for Patients: EntrepreneurPulse.com.au  Fact Sheet for Healthcare Providers: IncredibleEmployment.be  This test is not yet approved or cleared by the [place] FDA and has been authorized for detection and/or diagnosis of SARS-CoV-2 by FDA under an Emergency Use Authorization (EUA). This EUA will remain in effect (meaning this test can be used) for the duration of the COVID-19 declaration under Section 564(b)(1) of the Act, 21 U.S.C. section 360bbb-3(b)(1), unless the authorization is terminated or revoked.  Performed at [facility], [place],  [address]   Respiratory (~20 pathogens) panel by PCR     Status: None   Collection Time: 07/16/21  9:40 PM   Specimen: Nasopharyngeal Swab; Respiratory  Result Value Ref Range Status   Adenovirus NOT DETECTED NOT DETECTED Final   Coronavirus 229E NOT DETECTED NOT DETECTED Final    Comment: (NOTE) The Coronavirus on the Respiratory Panel, DOES NOT test for the novel  Coronavirus (2019 nCoV)    Coronavirus HKU1 NOT DETECTED NOT DETECTED Final   Coronavirus NL63 NOT DETECTED NOT DETECTED Final   Coronavirus OC43 NOT DETECTED NOT DETECTED Final   Metapneumovirus NOT DETECTED NOT DETECTED Final   Rhinovirus / Enterovirus NOT DETECTED NOT DETECTED Final   Influenza A NOT DETECTED NOT DETECTED Final    Influenza B NOT DETECTED NOT DETECTED Final   Parainfluenza Virus 1 NOT DETECTED NOT DETECTED Final   Parainfluenza Virus 2 NOT DETECTED NOT DETECTED Final  Parainfluenza Virus 3 NOT DETECTED NOT DETECTED Final   Parainfluenza Virus 4 NOT DETECTED NOT DETECTED Final   Respiratory Syncytial Virus NOT DETECTED NOT DETECTED Final   Bordetella pertussis NOT DETECTED NOT DETECTED Final   Bordetella Parapertussis NOT DETECTED NOT DETECTED Final   Chlamydophila pneumoniae NOT DETECTED NOT DETECTED Final   Mycoplasma pneumoniae NOT DETECTED NOT DETECTED Final    Comment: Performed at [facility], [place] [address], [place], [place] [address]  MRSA Next Gen by PCR, Nasal     Status: None   Collection Time: 07/18/21  4:52 AM   Specimen: Nasal Mucosa; Nasal Swab  Result Value Ref Range Status   MRSA by PCR Next Gen NOT DETECTED NOT DETECTED Final    Comment: (NOTE) The GeneXpert MRSA Assay (FDA approved for NASAL specimens only), is one component of a comprehensive MRSA colonization surveillance program. It is not intended to diagnose MRSA infection nor to guide or monitor treatment for MRSA infections. Test performance is not FDA approved in patients less than 19 years old. Performed at [facility], [address], [place], [place] [address]     [facility] If 7PM-7AM, please contact night-coverage at [email or web address], Office  [phone]   07/22/2021, 9:54 AM  LOS: 5 days

## 2021-07-22 NOTE — Consult Note (Addendum)
Consultation Note Date: 07/22/2021   Patient Name: Madison Mcbride  DOB: 12/29/1955  MRN: [id number]  Age / Sex: 66 y.o., female  PCP: Rusty Aus, MD Referring Physician: Gwynne Edinger, MD  Reason for Consultation: Establishing goals of care  HPI/Patient Profile: Madison Mcbride is a 66 y.o. female with medical history significant for morbid obesity, OSA on CPAP, heart failure, depression, anxiety, hyperlipidemia, neuropathy, hypothyroid, non-insulin-dependent diabetes mellitus, who presents emergency department for chief concerns of shortness of breath after forgetting to take her fluid pill.   Clinical Assessment and Goals of Care: Patient is sitting in bed, husband quickly to bedside. She states they are married and have 2 twin sons.   They discuss that husband works out of the home, and there is a person who comes to stay with her during he day that helps with cooking and cleaning, and companionship. Patient has a lift chair and uses a walker. She is able to navigate the house for restroom and to get food. She discusses SOB and joint pain.  When asked about her understanding of the hospitalization, husband discusses that she was here in September for the same thing. When asked about her understanding of the hospitalization, she states she forgot to take her fluid pill and that causes SOB. She quickly adds that she has been advised to get bariatric surgery- she is unsure if she would want this. Husband inquires as to facilities that offer the surgery and to how the weight loss could help her. Answered questions on how this could help. Inquired about OSA. She states she has a BIPAP at home, but takes it off during the night, or she gets up to go to the bathroom and does not replace it. Discussed how OSA can affect the body if CPAP or BIPAP is not used, and she quickly states "and you can die  from it, it's okay to say it." Discussed the dangers of CO2 build up. She voices concerns with the BIPAP here, and states hers is better than the one here, she just needs to use it as she should.   Broached GOC. She states she needs to discuss things with her husband such as having bariatric surgery. She states she has not really considered any boundaries on care and confirms full code status; she states she and her husband have been married over 26 years and states he will know what decisions to make if she is unable to make them for herself. He confirms this. Patient then thanked me for my visit.     SUMMARY OF RECOMMENDATIONS   Full code/full scope       Primary Diagnoses: Present on Admission:  Hypothyroidism  Acute respiratory failure with hypoxia and hypercapnia (HCC)  HLD (hyperlipidemia)  Acute hypoxemic respiratory failure (HCC)  Depression  Benign essential hypertension  OSA (obstructive sleep apnea)  Stage 3a chronic kidney disease ([place])   I have reviewed the medical record, interviewed the patient and family, and examined the patient. The following aspects are  pertinent.  Past Medical History:  Diagnosis Date   CHF (congestive heart failure) (HCC)    Diabetes mellitus without complication (HCC)    Hypothyroidism    Lymphedema    RLS (restless legs syndrome)    Sleep apnea    Thyroid cancer ([place]) 2007   Social History   Socioeconomic History   Marital status: Married    Spouse name: Not on file   Number of children: Not on file   Years of education: Not on file   Highest education level: Not on file  Occupational History   Not on file  Tobacco Use   Smoking status: Never   Smokeless tobacco: Never  Substance and Sexual Activity   Alcohol use: No   Drug use: No   Sexual activity: Not on file  Other Topics Concern   Not on file  Social History Narrative   Not on file   Social Determinants of Health   Financial Resource Strain: Not on file  Food  Insecurity: Not on file  Transportation Needs: Not on file  Physical Activity: Not on file  Stress: Not on file  Social Connections: Not on file   Family History  Problem Relation Age of Onset   Breast cancer Neg Hx    Scheduled Meds:  atorvastatin  20 mg Oral Q2200   buPROPion  150 mg Oral Daily   Chlorhexidine Gluconate Cloth  6 each Topical Daily   docusate sodium  100 mg Oral Daily   [START ON 07/23/2021] enoxaparin (LOVENOX) injection  40 mg Subcutaneous Q24H   fluticasone  1 spray Each Nare Daily   gabapentin  100 mg Oral Q1200   heparin  5,000 Units Subcutaneous Q8H   hydrocerin   Topical Daily   levothyroxine  125 mcg Oral Daily   midodrine  2.5 mg Oral TID WC   montelukast  10 mg Oral Daily   pantoprazole  40 mg Oral Daily   rOPINIRole  3 mg Oral TID   sodium chloride flush  3 mL Intravenous Q12H   [START ON 07/23/2021] torsemide  40 mg Oral Daily   Continuous Infusions:  sodium chloride     PRN Meds:.sodium chloride, acetaminophen, ondansetron (ZOFRAN) IV, sodium chloride flush Medications Prior to Admission:  Prior to Admission medications   Medication Sig Start Date End Date Taking? Authorizing Provider  atorvastatin (LIPITOR) 20 MG tablet Take 20 mg by mouth daily. 01/31/20  Yes [provider]  Biotin 2.5 MG CAPS Take 2.5 mg by mouth daily.    Yes [provider]  buPROPion (WELLBUTRIN XL) 150 MG 24 hr tablet Take 150 mg by mouth daily. 01/10/21  Yes [provider]  carvedilol (COREG) 6.25 MG tablet Take 1 tablet (6.25 mg total) by mouth 2 (two) times daily with a meal. 04/14/21  Yes Darylene Price A, FNP  Cholecalciferol 25 MCG (1000 UT) tablet Take 1 tablet by mouth daily.   Yes [provider]  gabapentin 50 MG TABS Take 300 mg by mouth 3 (three) times daily. Patient taking differently: Take 300 mg by mouth 3 (three) times daily. 2 caps in am , 1 cap at noon and 2 caps at bedtime 03/24/21  Yes Max Sane, MD  levothyroxine  (SYNTHROID) 125 MCG tablet Take 250 mcg by mouth daily. 11/11/20 11/12/21 Yes [provider]  Multiple Vitamin (MULTIVITAMIN) tablet Take 1 tablet by mouth daily.   Yes [provider]  pantoprazole (PROTONIX) 40 MG tablet Take 40 mg by mouth  daily. 01/11/21  Yes [provider]  potassium chloride SA (KLOR-CON) 20 MEQ tablet Take 1 tablet (20 mEq total) by mouth daily. 04/14/21  Yes Hackney, Otila Kluver A, FNP  propranolol (INDERAL) 20 MG tablet Take 20 mg by mouth 3 (three) times daily. 06/15/21  Yes [provider]  rOPINIRole (REQUIP) 3 MG tablet Take 3 mg by mouth 3 (three) times daily. 01/20/18  Yes [provider]  Semaglutide, 1 MG/DOSE, 2 MG/1.5ML SOPN Inject 0.75 mLs into the skin once a week. 10/14/20  Yes [provider]  torsemide (DEMADEX) 20 MG tablet Take 2 tablets (40 mg total) by mouth daily. 04/14/21  Yes Darylene Price A, FNP  zinc gluconate 50 MG tablet Take 50 mg by mouth daily.   Yes [provider]  atorvastatin (LIPITOR) 20 MG tablet Take 20 mg by mouth daily at 10 pm. 01/31/20 01/30/21  [provider]  montelukast (SINGULAIR) 10 MG tablet Take 10 mg by mouth daily. Patient not taking: Reported on 07/16/2021 02/24/21 02/24/22  [provider]   No Known Allergies Review of Systems  Respiratory:  Positive for shortness of breath.    Physical Exam Pulmonary:     Effort: Pulmonary effort is normal.  Neurological:     Mental Status: She is alert.    Vital Signs: BP 107/64    Pulse 64    Temp 98.7 F (37.1 C) (Oral)    Resp 16    Ht 5\' 5"  (1.651 m)    Wt (!) 156 kg    SpO2 91%    BMI 57.23 kg/m  Pain Scale: 0-10   Pain Score: 0-No pain   SpO2: SpO2: 91 % O2 Device:SpO2: 91 % O2 Flow Rate: .O2 Flow Rate (L/min): 3 L/min  IO: Intake/output summary:  Intake/Output Summary (Last 24 hours) at 07/22/2021 1349 Last data filed at 07/22/2021 1100 Gross per 24 hour  Intake 483 ml  Output 2150 ml  Net -1667 ml     LBM: Last BM Date: 07/20/21 Baseline Weight: Weight: (!) 138.3 kg Most recent weight: Weight: (!) 156 kg       Time In: 12:45 Time Out: 1:15 Time Total: 30 min Greater than 50%  of this time was spent counseling and coordinating care related to the above assessment and plan.  Signed by: Asencion Gowda, NP   Please contact Palliative Medicine Team phone at [phone] for questions and concerns.  For individual provider: See Shea Evans

## 2021-07-22 NOTE — TOC Progression Note (Addendum)
Transition of Care [facility]) - Progression Note    Patient Details  Name: Arcenia Scarbro MRN: [id number] Date of Birth: 1955-12-17  Transition of Care [facility]) CM/SW [place], RN Phone Number: 07/22/2021, 9:01 AM  Clinical Narrative:  Called [facility] in [place] to inquire about admission and Liliane Channel declined due to Insurance being out of network. Called husband left voice message to return call and that SNF placement decision need to be made today.  Spoke with husband, who says he spoke with an employee at [facility] and they wanted me to call Tammy or [facility] Kluver now they have beds and may consider taking patient. TOCRN did call Otila Kluver and Tammy left voice message. Meanwhile spoke with Shirlee Limerick who accepted patient, however Husband says they did not have a good experience with them in the past. Husband understands that [facility] will need to pursue bedoffer to get Chief Strategy Officer. So that discharge is not delayed.    Expected Discharge Plan: [place] Barriers to Discharge: Continued Medical Work up, SNF Pending bed offer  Expected Discharge Plan and Services Expected Discharge Plan: [place] In-house Referral: Clinical Social Work   Post Acute Care Choice: [place] Living arrangements for the past 2 months: Single Family Home                                       Social Determinants of Health (SDOH) Interventions    Readmission Risk Interventions No flowsheet data found.

## 2021-07-22 NOTE — Progress Notes (Signed)
°   07/22/21 1050  Clinical Encounter Type  Visited With Patient and family together  Visit Type Initial;Spiritual support;Social support  Referral From Chaplain  Consult/Referral To [facility] [employer] visited PT and established initial spiritual care. PT's husband was at bedside. Chaplain made space for expression of thoughts and feelings. PT spoke of having a challenging day physically. Chaplain ministered with compassionate presence and reflective listening. On-call chaplain is available if further needed.  Andee Poles, MDiv

## 2021-07-22 NOTE — Progress Notes (Signed)
PHARMACIST - PHYSICIAN COMMUNICATION  CONCERNING:  Enoxaparin (Lovenox) for DVT Prophylaxis    RECOMMENDATION: Patient was prescribed enoxaparin 40 mg q24 hours for VTE prophylaxis.   Filed Weights   07/20/21 0500 07/21/21 0500 07/22/21 0500  Weight: (!) 151.4 kg (333 lb 12.4 oz) (!) 154.8 kg (341 lb 4.4 oz) (!) 156 kg (343 lb 14.7 oz)    Body mass index is 57.23 kg/m.  Estimated Creatinine Clearance: 66.6 mL/min (A) (by C-G formula based on SCr of 1.25 mg/dL (H)).   Based on [facility] patient is candidate for enoxaparin 0.5 mg/kg TBW SQ every 24 hours based on BMI > 30.  DESCRIPTION: Pharmacy has adjusted enoxaparin dose per [facility] policy.  Patient is now receiving enoxaparin 77.5 mg every 24 hours    Tawnya Crook, PharmD, BCPS Clinical Pharmacist 07/22/2021 2:11 PM

## 2021-07-22 NOTE — Progress Notes (Signed)
OT Cancellation Note  Patient Details Name: Madison Mcbride MRN: [id number] DOB: 1956/01/22   Cancelled Treatment:    Reason Eval/Treat Not Completed: Other (comment). Pt with nursing for care upon attempt. Will re-attempt at later date/time as pt is available and medically appropriate.   Ardeth Perfect., MPH, MS, OTR/L ascom [phone] 07/22/21, 5:05 PM

## 2021-07-22 NOTE — Progress Notes (Addendum)
[facility]  HPI: Patient is a 66 year old female with a past medical history of HFpEF (LVEF 45-50% 03/2021), OSA on CPAP, hyperlipidemia, type 2 diabetes who presented to [facility] ED 07/15/2021 with complaints of shortness of breath.  Cardiology is consulted for further management of her heart failure.  Interval history: -The patient states she did not have a good night, she said the BiPAP was blowing into her eyes and the started burning. -She is still pending transition to stepdown and remains in the ICU due to her intermittent hyper somnolence during the day and BiPAP requirements. -Her pulse was low overnight therefore carvedilol was discontinued entirely -SNF placement is ongoing. -1.5 L of urine output recorded overnight    Vitals:   07/22/21 0500 07/22/21 0600 07/22/21 0700 07/22/21 0800  BP: (!) 117/51 119/63 (!) 93/44 114/62  Pulse: (!) 54 (!) 51 (!) 54 60  Resp: 16 18 16 18   Temp:    98.7 F (37.1 C)  TempSrc:    Oral  SpO2: 97% 95% 95% 98%  Weight: (!) 156 kg     Height:         Intake/Output Summary (Last 24 hours) at 07/22/2021 1154 Last data filed at 07/22/2021 1100 Gross per 24 hour  Intake 243 ml  Output 2650 ml  Net -2407 ml    PHYSICAL EXAM General: Pleasant caucasian female, well nourished, in no acute distress. Husband at bedside. On oxygen [place] HEENT:  Normocephalic and atraumatic. Neck:   No JVD.  Lungs: Normal respiratory effort on [place]. clear bilaterally to auscultation anteriorly Heart: HRRR . Normal S1 and S2.  Radial pulses 2+ bilaterally. Abdomen: Obese appearing.  Msk: Normal strength and tone for age. Extremities: 1+ bilateral LEE wrapped in Kerlix and ABD pads covering her chronic venous stasis dermatitis Neuro: Alert and oriented X 3. Psych:  Mood somewhat sad, affect congruent.    LABS: Basic Metabolic Panel: Recent Labs    07/20/21 0309 07/21/21 0454 07/22/21 0426  NA 139 139 138  K 4.4 4.1 3.9  CL 99 99 96*  CO2 32 33* 34*   GLUCOSE 81 78 78  BUN 45* 35* 30*  CREATININE 1.52* 1.29* 1.25*  CALCIUM 8.2* 8.4* 8.3*  MG 2.3 2.3  --   PHOS 4.2 3.0  --     Liver Function Tests: No results for input(s): AST, ALT, ALKPHOS, BILITOT, PROT, ALBUMIN in the last 72 hours.  No results for input(s): LIPASE, AMYLASE in the last 72 hours. CBC: Recent Labs    07/20/21 0309 07/21/21 0454  WBC 9.5 9.4  HGB 10.9* 10.7*  HCT 37.8 38.2  MCV 87.5 87.2  PLT 318 290    Cardiac Enzymes: No results for input(s): CKTOTAL, CKMB, CKMBINDEX, TROPONINI in the last 72 hours. BNP: Invalid input(s): POCBNP D-Dimer: No results for input(s): DDIMER in the last 72 hours. Hemoglobin A1C: No results for input(s): HGBA1C in the last 72 hours. Fasting Lipid Panel: No results for input(s): CHOL, HDL, LDLCALC, TRIG, CHOLHDL, LDLDIRECT in the last 72 hours. Thyroid Function Tests: No results for input(s): TSH, T4TOTAL, T3FREE, THYROIDAB in the last 72 hours.  Invalid input(s): FREET3 Anemia Panel: No results for input(s): VITAMINB12, FOLATE, FERRITIN, TIBC, IRON, RETICCTPCT in the last 72 hours.  No results found.   Echo LVEF 45 to 50% 03/16/2021  TELEMETRY: Sinus rhythm:  ASSESSMENT AND PLAN:  Principal Problem:   Acute exacerbation of CHF (congestive heart failure) (HCC) Active Problems:   Hypothyroidism   Morbid obesity with  BMI of 50.0-59.9, adult (HCC)   Acute respiratory failure with hypoxia and hypercapnia (HCC)   HLD (hyperlipidemia)   Depression   OSA (obstructive sleep apnea)   Acute hypoxemic respiratory failure (HCC)   Benign essential hypertension   Stage 3a chronic kidney disease (HCC)    #Chronic diastolic congestive heart failure, HFpEF (LVEF 40-50% on 03/16/2021) - furosemide 40 mg IV daily, will transition back to home torsemide 40mg  PO once daily tomorrow - carvedilol 3.125 mg twice daily as a home med.  Holding carvedilol for now due to bradycardia. -BP low normal during hospitalization, will  consider escalation of GDMT (ACEi) as BP permits, she remains on midodrine which was added by critical care in the ED. -no further cardiac diagnostics at this time. Follow up with cardiologist outpatient (Dr. Nehemiah Massed) in 1-2 weeks.  -expected dispo is to a SNF  #Acute hypercarbic and hypoxemic respiratory failure,  #OSA on CPAP -Waxing and waning. Required BiPAP during the day yesterday and on oxygen by [place] during interview -agree with outpatient bariatric surgery referral, She seems hesitant and worried about the hard work and lifestyle change it will take. I had a long discussion with the patient encouraging her to at least meet with them for an initial consult in the future.  #Essential hypertension -BP has been low normal during her hospitalization, but also has not be OOB/tolerating much activity so far due to her intermittent bipap requirements -encourage participation with PT/OT and consider discontinuing midodrine 2.5 TID   #Elevated D-dimer -no evidence of pulmonary embolism by VQ scan, no DVT by doppler U/S  #Bilateral lower extremity venous stasis dermatitis -improving, would care as needed   #AKI on CKD, resolved -Creatinine back to baseline ~1.25  #type 2 diabetes As per primary team, consider initiation of SGLT2i on an outpatient basis. Appears pt was on a GLP1 in the past.   #Constipation Denies bowel movement for 3 days I added Colace once daily (reported home med)    Tristan Schroeder, PA-C 07/22/2021 11:54 AM

## 2021-07-23 ENCOUNTER — Inpatient Hospital Stay: Payer: Medicare Other

## 2021-07-23 LAB — BASIC METABOLIC PANEL
Anion gap: 9 (ref 5–15)
BUN: 21 mg/dL (ref 8–23)
CO2: 35 mmol/L — ABNORMAL HIGH (ref 22–32)
Calcium: 8.2 mg/dL — ABNORMAL LOW (ref 8.9–10.3)
Chloride: 97 mmol/L — ABNORMAL LOW (ref 98–111)
Creatinine, Ser: 0.98 mg/dL (ref 0.44–1.00)
GFR, Estimated: >60 mL/min (ref >60)
Glucose, Bld: 80 mg/dL (ref 70–99)
Potassium: 3.6 mmol/L (ref 3.5–5.1)
Sodium: 141 mmol/L (ref 135–145)

## 2021-07-23 LAB — GLUCOSE, CAPILLARY: Glucose-Capillary: 106 mg/dL — ABNORMAL HIGH (ref 70–99)

## 2021-07-23 MED ORDER — POLYETHYLENE GLYCOL 3350 17 G PO PACK
17.0000 g | PACK | Freq: Every day | ORAL | Status: DC
  Administered 2021-07-23 – 2021-07-24 (×2): 17 g via ORAL
  Filled 2021-07-23 (×7): qty 1

## 2021-07-23 MED ORDER — IBUPROFEN 400 MG PO TABS
600.0000 mg | ORAL_TABLET | Freq: Four times a day (QID) | ORAL | Status: DC | PRN
  Administered 2021-07-23 – 2021-07-29 (×14): 600 mg via ORAL
  Filled 2021-07-23 (×14): qty 2

## 2021-07-23 MED ORDER — DICLOFENAC SODIUM 1 % EX GEL
2.0000 g | CUTANEOUS | Status: DC | PRN
  Administered 2021-07-23 – 2021-07-28 (×11): 2 g via TOPICAL
  Filled 2021-07-23: qty 100

## 2021-07-23 NOTE — Progress Notes (Signed)
[facility]  HPI: Patient is a 66 year old female with a past medical history of HFpEF (LVEF 45-50% 03/2021), OSA on CPAP, hyperlipidemia, type 2 diabetes who presented to [facility] ED 07/15/2021 with complaints of shortness of breath.  Cardiology is consulted for further management of her heart failure.  Interval history: -she did not require BiPAP at all during the day yesterday and remained alert  -worked with PT this morning, making slow but sure progress -back on home torsemide 40mg  once daily and renal function has normalized to baseline.  -reports pain in her right hand. Denies chest pain, sob, palpitations, dizziness, presyncope.    Vitals:   07/23/21 0319 07/23/21 0400 07/23/21 0500 07/23/21 0800  BP:  135/72  (!) 141/81  Pulse:  64 66 65  Resp:  14 16 18   Temp:      TempSrc:      SpO2:  93% 92% 96%  Weight: (!) 157.7 kg     Height:         Intake/Output Summary (Last 24 hours) at 07/23/2021 0959 Last data filed at 07/23/2021 0230 Gross per 24 hour  Intake 33 ml  Output 1325 ml  Net -1292 ml    PHYSICAL EXAM General: Pleasant caucasian female, well nourished, in no acute distress. Husband at bedside. On oxygen [place] HEENT:  Normocephalic and atraumatic. Neck:   No JVD.  Lungs: Normal respiratory effort on [place]. clear bilaterally to auscultation anteriorly Heart: HRRR . Normal S1 and S2.  Radial pulses 2+ bilaterally. Abdomen: Obese appearing.  Msk: Normal strength and tone for age. Extremities: trace bilateral LEE, both lower extremities wrapped in Kerlix covering her chronic venous stasis dermatitis Neuro: Alert and oriented X 3. Psych:  Mood appropriate, affect congruent.    LABS: Basic Metabolic Panel: Recent Labs    07/21/21 0454 07/22/21 0426 07/23/21 0610  NA 139 138 141  K 4.1 3.9 3.6  CL 99 96* 97*  CO2 33* 34* 35*  GLUCOSE 78 78 80  BUN 35* 30* 21  CREATININE 1.29* 1.25* 0.98  CALCIUM 8.4* 8.3* 8.2*  MG 2.3  --   --   PHOS 3.0  --   --      Liver Function Tests: No results for input(s): AST, ALT, ALKPHOS, BILITOT, PROT, ALBUMIN in the last 72 hours.  No results for input(s): LIPASE, AMYLASE in the last 72 hours. CBC: Recent Labs    07/21/21 0454  WBC 9.4  HGB 10.7*  HCT 38.2  MCV 87.2  PLT 290    Cardiac Enzymes: No results for input(s): CKTOTAL, CKMB, CKMBINDEX, TROPONINI in the last 72 hours. BNP: Invalid input(s): POCBNP D-Dimer: No results for input(s): DDIMER in the last 72 hours. Hemoglobin A1C: No results for input(s): HGBA1C in the last 72 hours. Fasting Lipid Panel: No results for input(s): CHOL, HDL, LDLCALC, TRIG, CHOLHDL, LDLDIRECT in the last 72 hours. Thyroid Function Tests: No results for input(s): TSH, T4TOTAL, T3FREE, THYROIDAB in the last 72 hours.  Invalid input(s): FREET3 Anemia Panel: No results for input(s): VITAMINB12, FOLATE, FERRITIN, TIBC, IRON, RETICCTPCT in the last 72 hours.  DG Hand Complete Right  Result Date: 07/23/2021 CLINICAL DATA:  Arthritis, joint effusion of the hand EXAM: RIGHT HAND - COMPLETE 3+ VIEW COMPARISON:  None. FINDINGS: Marginal erosions especially at the index DIP joint, middle finger PIP joint, and little finger PIP joint. Associated soft tissue swelling. No visible involvement of the carpus or wrist. Dislocated appearance of the fifth PIP joint. IMPRESSION: Erosive arthropathy involving  the interphalangeal joints, worse at the little finger PIP joint where there is dislocation. Electronically Signed   By: Jorje Guild M.D.   On: 07/23/2021 09:50    Echo LVEF 45 to 50% 03/16/2021  TELEMETRY: Sinus rhythm rate 62  ASSESSMENT AND PLAN:  Principal Problem:   Acute exacerbation of CHF (congestive heart failure) (HCC) Active Problems:   Hypothyroidism   Morbid obesity with BMI of 50.0-59.9, adult (HCC)   Acute respiratory failure with hypoxia and hypercapnia (HCC)   HLD (hyperlipidemia)   Depression   OSA (obstructive sleep apnea)   Acute hypoxemic  respiratory failure (HCC)   Benign essential hypertension   Stage 3a chronic kidney disease (HCC)    #Chronic diastolic congestive heart failure, HFpEF (LVEF 45-50% 03/16/2021) - s/p IV lasix with good UOP daily. Started home torsemide 40mg  PO once daily this morning to be continued at discharge.  - carvedilol 3.125 mg twice daily as a home med.  Holding carvedilol for now due to bradycardia. Consider adding back coreg once weaned off midodrine and as HR allows.  -no further cardiac diagnostics at this time. Follow up with cardiologist outpatient (Dr. Nehemiah Massed) in 1-2 weeks.  -expected dispo is to a SNF  #Acute hypercarbic and hypoxemic respiratory failure 2/2 OHS #OSA on CPAP -better sustained mentation over the past 24 hours without daytime bipap requirements.  -agree with outpatient bariatric surgery referral and other suggested weight loss strategies.  -encourage participation with PT/OT   #Essential hypertension -BP has been low normal during her hospitalization so far although improving slightly to 169C-789F systolic over the past 24 hours. She remains off all antihypertensives and has been on low dose midodrine since admission. Consider weaning off midodrine as able, per primary team.   #Elevated D-dimer -no evidence of pulmonary embolism by VQ scan, no DVT by doppler U/S  #Bilateral lower extremity venous stasis dermatitis -improving, would care as needed   #AKI on CKD, resolved -Creatinine and GFR improved to within normal limits.   #type 2 diabetes As per primary team, consider initiation of SGLT2i on an outpatient basis. Appears pt was on a GLP1 in the past.   #Constipation Continue colace, miralax.   Cardiology signing off for now.  Please contact me or Dr. Corky Sox if needed throughout the week.  This plan of care was created and discussed with Dr. Corky Sox and he is in agreement.  Tristan Schroeder, PA-C 07/23/2021 9:59 AM

## 2021-07-23 NOTE — Progress Notes (Addendum)
Occupational Therapy Treatment Patient Details Name: Madison Mcbride MRN: [id number] DOB: 1956-04-30 Today's Date: 07/23/2021   History of present illness Madison Mcbride is a 66 y.o. female with medical history significant for morbid obesity, OSA on CPAP, heart failure, depression, anxiety, hyperlipidemia, neuropathy, hypothyroid, non-insulin-dependent diabetes mellitus, who presents emergency department for chief concerns of shortness of breath.   OT comments  Pt seen for PT/OT co-tx this date in an attempt to progress mobility given limited fxl activity tolerance. She requires MAX A +2 to come to EOB sitting and demos F to P static sitting balance with at least unilateral support d/t fatigue and weakness. Attempted to engage pt in stand with TOTAL A +2 with essentially no success as pt was unable to clear buttocks from the bed despite cues, AD and therapists' assistance. She is returned to bed end of session with all needs met and in reach. OT engages pt in R hand opposition x5 per digit and flex/ext at the MCP/DIPs of digits x10. OT also elevated R UE on pillow d/t noted edema. She tolerates well. Note-pt with c/o R hand pain and XR taken this date indicating erosive arthropathy and "Dislocated appearance of the fifth PIP joint". Pt left with all needs met and in reach. Spouse in room throughout session. Will continue to follow acutely.    Recommendations for follow up therapy are one component of a multi-disciplinary discharge planning process, led by the attending physician.  Recommendations may be updated based on patient status, additional functional criteria and insurance authorization.    Follow Up Recommendations  Skilled nursing-short term rehab (<3 hours/day)    Assistance Recommended at Discharge    Patient can return home with the following  Two people to help with walking and/or transfers;Two people to help with bathing/dressing/bathroom;Assistance with  cooking/housework;Assist for transportation;Help with stairs or ramp for entrance   Equipment Recommendations  Other (comment) (defer)    Recommendations for Other Services      Precautions / Restrictions Precautions Precautions: Fall Restrictions Weight Bearing Restrictions: No       Mobility Bed Mobility Overal bed mobility: Needs Assistance Bed Mobility: Supine to Sit;Sit to Supine     Supine to sit: Max assist;+2 for physical assistance Sit to supine: Max assist;+2 for physical assistance   General bed mobility comments: heavy assist and post lean once seated at EOB requiring constant mod assist to maintain. Pt able to maintain seated balance for ~10 min and progressed to sitting balance with supervision.    Transfers Overall transfer level: Needs assistance Equipment used: Rolling walker (2 wheels) Transfers: Sit to/from Stand Sit to Stand: Total assist;+2 physical assistance;From elevated surface           General transfer comment: Heavy cues for ant lean prior to standing. RW used with poor effort during standing attempt. Unable to lift buttocks off bed and further attempts declined due to fatigue. Heavy +2     Balance Overall balance assessment: Needs assistance Sitting-balance support: Feet supported Sitting balance-Leahy Scale: Fair Sitting balance - Comments: requires close supervision   Standing balance support: Bilateral upper extremity supported;During functional activity;Reliant on assistive device for balance Standing balance-Leahy Scale: Zero Standing balance comment: unabel to clear bottom with TOTAL A +2 today, pt somewhat fearful and noted to lean posteriorly despite cues to lean FWD and appropraite core strength to do so.  ADL either performed or assessed with clinical judgement   ADL Overall ADL's : Needs assistance/impaired                     Lower Body Dressing: Maximal assistance;Total  assistance;Bed level Lower Body Dressing Details (indicate cue type and reason): donning socks, d/t limited tolerance for LE ROM 2/2 pain as well as limited by abdominal girth, even with figure-4 modification                    Extremity/Trunk Assessment              Vision       Perception     Praxis      Cognition Arousal/Alertness: Awake/alert Behavior During Therapy: WFL for tasks assessed/performed Overall Cognitive Status: Within Functional Limits for tasks assessed                                            Exercises Other Exercises Other Exercises: OT engages pt in R hand opposition x5 per digit and flex/ext of digits x10. OT also elevated R UE on pillow d/t noted edema. She tolerates well. Note-pt with c/o R hand pain and XR taken this date indicating erosive arthropathy and possible dislocation of 5th digit PIP joint which is visibly likely very arthritic just in it's outward appearance.   Shoulder Instructions       General Comments      Pertinent Vitals/ Pain       Pain Assessment: No/denies pain  Home Living                                          Prior Functioning/Environment              Frequency  Min 2X/week        Progress Toward Goals  OT Goals(current goals can now be found in the care plan section)  Progress towards OT goals: OT to reassess next treatment  Acute Rehab OT Goals Patient Stated Goal: to get stronger OT Goal Formulation: With patient Time For Goal Achievement: 08/02/21 Potential to Achieve Goals: [place] Discharge plan remains appropriate    Co-evaluation    PT/OT/SLP Co-Evaluation/Treatment: Yes Reason for Co-Treatment: Complexity of the patient's impairments (multi-system involvement);For patient/therapist safety;To address functional/ADL transfers PT goals addressed during session: Mobility/safety with mobility OT goals addressed during session: ADL's and  self-care      AM-PAC OT "6 Clicks" Daily Activity     Outcome Measure   Help from another person eating meals?: A Little Help from another person taking care of personal grooming?: A Little Help from another person toileting, which includes using toliet, bedpan, or urinal?: Total Help from another person bathing (including washing, rinsing, drying)?: A Lot Help from another person to put on and taking off regular upper body clothing?: A Lot Help from another person to put on and taking off regular lower body clothing?: Total 6 Click Score: 12    End of Session Equipment Utilized During Treatment: Gait belt;Rolling walker (2 wheels)  OT Visit Diagnosis: Unsteadiness on feet (R26.81);Muscle weakness (generalized) (M62.81)   Activity Tolerance Patient limited by fatigue   Patient Left in bed;with call bell/phone within reach;with bed alarm set;with nursing/sitter in  room   Nurse Communication Mobility status        Time: 5501-5868 OT Time Calculation (min): 25 min  Charges: OT General Charges $OT Visit: 1 Visit OT Treatments $Therapeutic Exercise: 8-22 mins  Gerrianne Scale, MS, OTR/L ascom [phone] 07/23/21, 5:50 PM

## 2021-07-23 NOTE — Progress Notes (Signed)
PROGRESS NOTE    Madison Mcbride  ZOX:[id number] DOB: 1956/04/24 DOA: 07/15/2021 PCP: Rusty Aus, MD     Brief Narrative:  Madison Mcbride is a 66 year old female with medical history of morbid obesity, OSA on CPAP, CHF, depression, anxiety, hyperlipidemia, neuropathy, diabetes mellitus type 2 presented to the ED with complaints of shortness of breath.  As per patient she forgot to take her fluid medications for past 2 days.  She does not always wear her BiPAP machine at night. In the ED initially SPO2 was 85% on room air improved to 97% on 2 L of oxygen via nasal cannula.  Patient received furosemide 60 mg IV in the ED. ABG showed PCO2 of 76, pH 7.21.  Pulmonology was consulted, patient placed on BiPAP. Cardiology also was consulted for guidance regarding diuresis for CHF.  New events last 24 hours / Subjective: Patient overall states that she is not feeling very well.  Her main complaint is right hand joint pain especially in her fifth digit.  Pain includes PIP and DIP joints  Assessment & Plan:   Principal Problem:   Acute exacerbation of CHF (congestive heart failure) (HCC) Active Problems:   Hypothyroidism   Morbid obesity with BMI of 50.0-59.9, adult (HCC)   Acute respiratory failure with hypoxia and hypercapnia (HCC)   HLD (hyperlipidemia)   Depression   OSA (obstructive sleep apnea)   Acute hypoxemic respiratory failure (HCC)   Benign essential hypertension   Stage 3a chronic kidney disease (HCC)   Acute hypoxemic and hypercarbic respiratory failure -Secondary to CHF exacerbation, OSA/OHS -Continue BiPAP nightly and during naps -Pulmonology signed off 1/9   Acute on chronic diastolic CHF exacerbation -Appreciate cardiology following, follow-up with outpatient cardiologist Dr. 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 Roland Rack consulted to see tomorrow     DVT prophylaxis: Lovenox  Place TED hose Start: 07/20/21 1207 Place TED hose Start: 07/15/21 2330  Code Status: Full Family Communication: At bedside Disposition Plan:  Status is: Inpatient  Remains inpatient appropriate because: SNF placement pending     Antimicrobials:  Anti-infectives (From admission, onward)    Start     Dose/Rate Route Frequency Ordered Stop   07/17/21 0900  cefTRIAXone (ROCEPHIN) 2 g in sodium chloride 0.9 % 100 mL IVPB  Status:  Discontinued        2 g 200 mL/hr over 30 Minutes Intravenous Every 24 hours 07/17/21 0850 07/19/21 1013        Objective: Vitals:   07/23/21 0500 07/23/21 0800 07/23/21 1000 07/23/21 1200  BP:  (!) 141/81 105/78 129/74  Pulse: 66 65 60 60  Resp: 16 18 20 15   Temp:  97.7 F (36.5 C)  98.1 F (36.7 C)  TempSrc:  Oral  Oral  SpO2: 92% 96% 98% 93%  Weight:      Height:        Intake/Output Summary (Last 24 hours) at 07/23/2021 1338 Last data filed at 07/23/2021 0900 Gross per 24 hour  Intake 153 ml  Output 325 ml  Net -172 ml  Filed Weights   07/21/21 0500 07/22/21 0500 07/23/21 0319  Weight: (!) 154.8 kg (!) 156 kg (!) 157.7 kg    Examination:  General exam: Appears calm and comfortable  Respiratory system: Diminished breath sounds, respiratory effort is normal on 3 L nasal cannula O2 Cardiovascular system: S1 & S2 heard, RRR. No murmurs.  Trace pedal edema. Gastrointestinal system: Abdomen is nondistended, soft and nontender. Normal bowel sounds heard. Central  nervous system: Alert and oriented. No focal neurological deficits. Speech clear.  Extremities: Right hand with second third fourth and fifth digit PIP and DIP joint effusions Psychiatry: Judgement and insight appear normal. Mood & affect appropriate.   Data Reviewed: I have personally reviewed following labs and imaging studies  CBC: Recent Labs  Lab 07/19/21 0439 07/20/21 0309 07/21/21 0454  WBC 11.6* 9.5 9.4  HGB 11.4* 10.9* 10.7*  HCT 40.2 37.8 38.2  MCV 87.0 87.5 87.2  PLT 325 318 540   Basic Metabolic Panel: Recent Labs  Lab 07/19/21 0439 07/20/21 0309 07/21/21 0454 07/22/21 0426 07/23/21 0610  NA 139 139 139 138 141  K 4.5 4.4 4.1 3.9 3.6  CL 100 99 99 96* 97*  CO2 31 32 33* 34* 35*  GLUCOSE 90 81 78 78 80  BUN 51* 45* 35* 30* 21  CREATININE 1.97* 1.52* 1.29* 1.25* 0.98  CALCIUM 7.9* 8.2* 8.4* 8.3* 8.2*  MG 2.2 2.3 2.3  --   --   PHOS 5.2* 4.2 3.0  --   --    GFR: Estimated Creatinine Clearance: 85.6 mL/min (by C-G formula based on SCr of 0.98 mg/dL). Liver Function Tests: Recent Labs  Lab 07/19/21 0439  AST 71*  ALT 68*  ALKPHOS 108  BILITOT 1.2  PROT 6.7  ALBUMIN 3.0*   No results for input(s): LIPASE, AMYLASE in the last 168 hours. No results for input(s): AMMONIA in the last 168 hours. Coagulation Profile: No results for input(s): INR, PROTIME in the last 168 hours. Cardiac Enzymes: No results for input(s): CKTOTAL, CKMB, CKMBINDEX, TROPONINI in the last 168 hours. BNP (last 3 results) No results for input(s): PROBNP in the last 8760 hours. HbA1C: No results for input(s): HGBA1C in the last 72 hours. CBG: Recent Labs  Lab 07/18/21 0524  GLUCAP 89   Lipid Profile: No results for input(s): CHOL, HDL, LDLCALC, TRIG, CHOLHDL, LDLDIRECT in the last 72 hours. Thyroid Function Tests: No results for input(s): TSH, T4TOTAL, FREET4, T3FREE, THYROIDAB in the last 72 hours. Anemia Panel: No results for input(s): VITAMINB12, FOLATE, FERRITIN, TIBC,  IRON, RETICCTPCT in the last 72 hours. Sepsis Labs: No results for input(s): PROCALCITON, LATICACIDVEN in the last 168 hours.  Recent Results (from the past 240 hour(s))  Resp Panel by RT-PCR (Flu A&B, Covid) Nasopharyngeal Swab     Status: None   Collection Time: 07/15/21 10:29 PM   Specimen: Nasopharyngeal Swab; Nasopharyngeal(NP) swabs in vial transport medium  Result Value Ref Range Status   SARS Coronavirus 2 by RT PCR NEGATIVE NEGATIVE Final    Comment: (NOTE) SARS-CoV-2 target nucleic acids are NOT DETECTED.  The SARS-CoV-2 RNA is generally detectable in upper respiratory specimens during the acute phase of infection. The lowest concentration of SARS-CoV-2 viral copies this assay can detect is 138 copies/mL. A negative result does not preclude SARS-Cov-2 infection and should not be used as the sole basis for treatment or other patient management decisions. A negative result may occur with  improper specimen collection/handling, submission of specimen other than nasopharyngeal swab, presence  of viral mutation(s) within the areas targeted by this assay, and inadequate number of viral copies(<138 copies/mL). A negative result must be combined with clinical observations, patient history, and epidemiological information. The expected result is Negative.  Fact Sheet for Patients:  EntrepreneurPulse.com.au  Fact Sheet for Healthcare Providers:  IncredibleEmployment.be  This test is no t yet approved or cleared by the [place] FDA and  has been authorized for detection and/or diagnosis of SARS-CoV-2 by FDA under an Emergency Use Authorization (EUA). This EUA will remain  in effect (meaning this test can be used) for the duration of the COVID-19 declaration under Section 564(b)(1) of the Act, 21 U.S.C.section 360bbb-3(b)(1), unless the authorization is terminated  or revoked sooner.       Influenza A by PCR NEGATIVE NEGATIVE Final    Influenza B by PCR NEGATIVE NEGATIVE Final    Comment: (NOTE) The Xpert Xpress SARS-CoV-2/FLU/RSV plus assay is intended as an aid in the diagnosis of influenza from Nasopharyngeal swab specimens and should not be used as a sole basis for treatment. Nasal washings and aspirates are unacceptable for Xpert Xpress SARS-CoV-2/FLU/RSV testing.  Fact Sheet for Patients: EntrepreneurPulse.com.au  Fact Sheet for Healthcare Providers: IncredibleEmployment.be  This test is not yet approved or cleared by the [place] FDA and has been authorized for detection and/or diagnosis of SARS-CoV-2 by FDA under an Emergency Use Authorization (EUA). This EUA will remain in effect (meaning this test can be used) for the duration of the COVID-19 declaration under Section 564(b)(1) of the Act, 21 U.S.C. section 360bbb-3(b)(1), unless the authorization is terminated or revoked.  Performed at [facility], [place], [place] [address]   Respiratory (~20 pathogens) panel by PCR     Status: None   Collection Time: 07/16/21  9:40 PM   Specimen: Nasopharyngeal Swab; Respiratory  Result Value Ref Range Status   Adenovirus NOT DETECTED NOT DETECTED Final   Coronavirus 229E NOT DETECTED NOT DETECTED Final    Comment: (NOTE) The Coronavirus on the Respiratory Panel, DOES NOT test for the novel  Coronavirus (2019 nCoV)    Coronavirus HKU1 NOT DETECTED NOT DETECTED Final   Coronavirus NL63 NOT DETECTED NOT DETECTED Final   Coronavirus OC43 NOT DETECTED NOT DETECTED Final   Metapneumovirus NOT DETECTED NOT DETECTED Final   Rhinovirus / Enterovirus NOT DETECTED NOT DETECTED Final   Influenza A NOT DETECTED NOT DETECTED Final   Influenza B NOT DETECTED NOT DETECTED Final   Parainfluenza Virus 1 NOT DETECTED NOT DETECTED Final   Parainfluenza Virus 2 NOT DETECTED NOT DETECTED Final   Parainfluenza Virus 3 NOT DETECTED NOT DETECTED Final    Parainfluenza Virus 4 NOT DETECTED NOT DETECTED Final   Respiratory Syncytial Virus NOT DETECTED NOT DETECTED Final   Bordetella pertussis NOT DETECTED NOT DETECTED Final   Bordetella Parapertussis NOT DETECTED NOT DETECTED Final   Chlamydophila pneumoniae NOT DETECTED NOT DETECTED Final   Mycoplasma pneumoniae NOT DETECTED NOT DETECTED Final    Comment: Performed at [facility], [place]. [address] [place], [place] [address]  MRSA Next Gen by PCR, Nasal     Status: None   Collection Time: 07/18/21  4:52 AM   Specimen: Nasal Mucosa; Nasal Swab  Result Value Ref Range Status   MRSA by PCR Next Gen NOT DETECTED NOT DETECTED Final    Comment: (NOTE) The GeneXpert MRSA Assay (FDA approved for NASAL specimens only), is one component of a comprehensive MRSA colonization surveillance program. It is  not intended to diagnose MRSA infection nor to guide or monitor treatment for MRSA infections. Test performance is not FDA approved in patients less than 32 years old. Performed at [facility], [address], [place], [place] [address]       Radiology Studies: DG Hand Complete Right  Result Date: 07/23/2021 CLINICAL DATA:  Arthritis, joint effusion of the hand EXAM: RIGHT HAND - COMPLETE 3+ VIEW COMPARISON:  None. FINDINGS: Marginal erosions especially at the index DIP joint, middle finger PIP joint, and little finger PIP joint. Associated soft tissue swelling. No visible involvement of the carpus or wrist. Dislocated appearance of the fifth PIP joint. IMPRESSION: Erosive arthropathy involving the interphalangeal joints, worse at the little finger PIP joint where there is dislocation. Electronically Signed   By: Jorje Guild M.D.   On: 07/23/2021 09:50      Scheduled Meds:  atorvastatin  20 mg Oral Q2200   buPROPion  150 mg Oral Daily   Chlorhexidine Gluconate Cloth  6 each Topical Daily   docusate sodium  100 mg Oral Daily   enoxaparin (LOVENOX) injection  0.5 mg/kg  Subcutaneous Q24H   fluticasone  1 spray Each Nare Daily   gabapentin  100 mg Oral Q1200   hydrocerin   Topical Daily   levothyroxine  125 mcg Oral Daily   midodrine  2.5 mg Oral TID WC   montelukast  10 mg Oral Daily   pantoprazole  40 mg Oral Daily   polyethylene glycol  17 g Oral Daily   rOPINIRole  3 mg Oral TID   sodium chloride flush  3 mL Intravenous Q12H   torsemide  40 mg Oral Daily   Continuous Infusions:  sodium chloride       LOS: 6 days      Time spent: 35 minutes   Dessa Phi, DO [facility] 07/23/2021, 1:38 PM   Available via Epic secure chat 7am-7pm After these hours, please refer to coverage provider listed on [email or web address]

## 2021-07-23 NOTE — Progress Notes (Signed)
Patient alert and orieted x4, NSR. Patient came off bipap at 0600 and place on 3L [place]. Patient put back on Bipap at 1400 while resting until 1645. Patient tolerated bipap and [place] well.  Wound care provided, TED hose placed. Tolerated well.

## 2021-07-23 NOTE — Progress Notes (Signed)
Physical Therapy Treatment Patient Details Name: Madison Mcbride MRN: [id number] DOB: 1956-04-10 Today's Date: 07/23/2021   History of Present Illness Madison Mcbride is a 66 y.o. female with medical history significant for morbid obesity, OSA on CPAP, heart failure, depression, anxiety, hyperlipidemia, neuropathy, hypothyroid, non-insulin-dependent diabetes mellitus, who presents emergency department for chief concerns of shortness of breath.    PT Comments    CO-treat performed with OT this date. Pt much more alert and agreeable to session. On 3L of O2 throughout exertion. Able to perform supine there-ex prior to OT joining for session. Pt able to transition to sitting at EOB with +2 and had 1 attempt to stand at bedside. Unable to perform further attempts at this time. Will continue to progress as able.   Recommendations for follow up therapy are one component of a multi-disciplinary discharge planning process, led by the attending physician.  Recommendations may be updated based on patient status, additional functional criteria and insurance authorization.  Follow Up Recommendations  Skilled nursing-short term rehab (<3 hours/day)     Assistance Recommended at Discharge Frequent or constant Supervision/Assistance  Patient can return home with the following Two people to help with walking and/or transfers;A lot of help with bathing/dressing/bathroom;Assistance with cooking/housework   Equipment Recommendations  None recommended by PT    Recommendations for Other Services       Precautions / Restrictions Precautions Precautions: Fall Restrictions Weight Bearing Restrictions: No     Mobility  Bed Mobility Overal bed mobility: Needs Assistance Bed Mobility: Supine to Sit;Sit to Supine     Supine to sit: Max assist;+2 for physical assistance Sit to supine: Max assist;+2 for physical assistance   General bed mobility comments: heavy assist and post lean once seated  at EOB requiring constant mod assist to maintain. Pt able to maintain seated balance for ~10 min and progressed to sitting balance with supervision.    Transfers Overall transfer level: Needs assistance Equipment used: Rolling walker (2 wheels) Transfers: Sit to/from Stand Sit to Stand: Total assist;+2 physical assistance;From elevated surface           General transfer comment: Heavy cues for ant lean prior to standing. RW used with poor effort during standing attempt. Unable to lift buttocks off bed and further attempts declined due to fatigue. Heavy +2    Ambulation/Gait               General Gait Details: unable   Stairs             Wheelchair Mobility    Modified Rankin (Stroke Patients Only)       Balance Overall balance assessment: Needs assistance Sitting-balance support: Feet supported Sitting balance-Leahy Scale: Fair                                      Cognition Arousal/Alertness: Awake/alert Behavior During Therapy: WFL for tasks assessed/performed Overall Cognitive Status: Within Functional Limits for tasks assessed                                 General Comments: improved alertness and is alert x 4. Pt very hesitant to participate in OOB mobility.        Exercises Other Exercises Other Exercises: supine ther-ex performed on B LE including AP, quad sets, SLRs, and hip abd/add. 15 reps performed with min  assist. All mobility performed on 3L of O2 with sats WNL with exertion.    General Comments        Pertinent Vitals/Pain Pain Assessment: No/denies pain (reports discomfort where bed rail meets leg at EOB)    Home Living                          Prior Function            PT Goals (current goals can now be found in the care plan section) Acute Rehab PT Goals Patient Stated Goal: to return home PT Goal Formulation: With patient Time For Goal Achievement: 08/02/21 Potential to Achieve  Goals: Fair Progress towards PT goals: Progressing toward goals    Frequency    Min 2X/week      PT Plan Current plan remains appropriate    Co-evaluation PT/OT/SLP Co-Evaluation/Treatment: Yes Reason for Co-Treatment: Complexity of the patient's impairments (multi-system involvement);For patient/therapist safety;To address functional/ADL transfers PT goals addressed during session: Mobility/safety with mobility OT goals addressed during session: ADL's and self-care;Strengthening/ROM      AM-PAC PT "6 Clicks" Mobility   Outcome Measure  Help needed turning from your back to your side while in a flat bed without using bedrails?: A Lot Help needed moving from lying on your back to sitting on the side of a flat bed without using bedrails?: A Lot Help needed moving to and from a bed to a chair (including a wheelchair)?: Total Help needed standing up from a chair using your arms (e.g., wheelchair or bedside chair)?: Total Help needed to walk in hospital room?: Total Help needed climbing 3-5 steps with a railing? : Total 6 Click Score: 8    End of Session Equipment Utilized During Treatment: Gait belt;Oxygen Activity Tolerance: Patient tolerated treatment well Patient left: in bed;with call bell/phone within reach;with family/visitor present (with OT at bedside) Nurse Communication: Mobility status PT Visit Diagnosis: Muscle weakness (generalized) (M62.81);Other abnormalities of gait and mobility (R26.89);Unsteadiness on feet (R26.81)     Time: 9470-9628 PT Time Calculation (min) (ACUTE ONLY): 27 min  Charges:  $Therapeutic Exercise: 8-22 mins $Therapeutic Activity: 8-22 mins                     Greggory Stallion, PT, DPT, GCS [phone]    Madison Mcbride 07/23/2021, 1:30 PM

## 2021-07-24 ENCOUNTER — Inpatient Hospital Stay: Payer: Medicare Other

## 2021-07-24 LAB — BASIC METABOLIC PANEL
Anion gap: 9 (ref 5–15)
BUN: 21 mg/dL (ref 8–23)
CO2: 39 mmol/L — ABNORMAL HIGH (ref 22–32)
Calcium: 8.3 mg/dL — ABNORMAL LOW (ref 8.9–10.3)
Chloride: 93 mmol/L — ABNORMAL LOW (ref 98–111)
Creatinine, Ser: 1 mg/dL (ref 0.44–1.00)
GFR, Estimated: >60 mL/min (ref >60)
Glucose, Bld: 95 mg/dL (ref 70–99)
Potassium: 3.7 mmol/L (ref 3.5–5.1)
Sodium: 141 mmol/L (ref 135–145)

## 2021-07-24 LAB — RESP PANEL BY RT-PCR (FLU A&B, COVID) ARPGX2
Influenza A by PCR: NEGATIVE
Influenza B by PCR: NEGATIVE
SARS Coronavirus 2 by RT PCR: NEGATIVE

## 2021-07-24 MED ORDER — ACETAMINOPHEN 500 MG PO TABS
1000.0000 mg | ORAL_TABLET | Freq: Four times a day (QID) | ORAL | Status: DC | PRN
  Administered 2021-07-25 – 2021-07-28 (×3): 1000 mg via ORAL
  Filled 2021-07-24 (×3): qty 2

## 2021-07-24 MED ORDER — INFLUENZA VAC A&B SA ADJ QUAD 0.5 ML IM PRSY
0.5000 mL | PREFILLED_SYRINGE | INTRAMUSCULAR | Status: AC
  Administered 2021-07-25: 0.5 mL via INTRAMUSCULAR
  Filled 2021-07-24: qty 0.5

## 2021-07-24 NOTE — Progress Notes (Signed)
Orthopedics was consulted on this patient earlier today.  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  After a discussion with my supervising physician, Dr. Roland Rack, the patient elected to proceed with a closed reduction of the right fifth digit PIP joint dislocation.  This procedure is detailed below.  A successful closed reduction was performed however whenever the finger was allowed to go back into gentle flexion palpation of the PIP joint demonstrated what felt to be repeat dislocation events.  Because of this the patient was placed in a volar splint keeping the DIP and PIP joint in extension and buddy taped to the fourth digit.  X-rays of the right fifth digit have been ordered as well.  Patient was instructed to continue to wear the AlumaFoam splint at this time keeping the finger in extension.  Right fifth digit PIP joint dislocation reduction: After discussion of the risk and benefits which include but are not limited to failed reduction, fracture, repeat dislocation, nerve damage, skin tear, etc the patient elected to proceed with a closed reduction.  A surgical timeout was performed.  The dorsal aspect of the fifth digit was cleaned using alcohol swab.  A digital block using 10 cc of 1% lidocaine without epinephrine was performed.  After allowing adequate time for anesthetic to take place, the PIP joint was able to be reduced with minimal traction.  Unfortunately, when the finger would go back into flexion it feels as if the dislocation reoccurs volarly.  A repeat reduction was performed once again successful and a volar AlumaFoam splint applied keeping the DIP and PIP joints in extension.  The fourth digit was also buddy taped to the fifth digit at this time as well.  The patient tolerated the procedure without any complications.  Raquel Tresia Revolorio, PA-C, CAQ-OS [place]

## 2021-07-24 NOTE — TOC Progression Note (Signed)
Transition of Care [facility]) - Progression Note    Patient Details  Name: Madison Mcbride MRN: [id number] Date of Birth: Jan 31, 1956  Transition of Care [facility]) CM/SW Contact  Shelbie Hutching, RN Phone Number: 07/24/2021, 2:22 PM  Clinical Narrative:    Called and reviewed current bed offers with patient's husband.  Husband really wants patient to go to [facility], he reports that she got the best therapy over there and that when the patient returned home after her rehab there she was the best she has ever been.   RNCM reached out to [facility] will re review referral and talk to the administrator.     Expected Discharge Plan: [place] Barriers to Discharge: Continued Medical Work up, SNF Pending bed offer  Expected Discharge Plan and Services Expected Discharge Plan: [place] In-house Referral: Clinical Social Work   Post Acute Care Choice: [place] Living arrangements for the past 2 months: Single Family Home                                       Social Determinants of Health (SDOH) Interventions    Readmission Risk Interventions No flowsheet data found.

## 2021-07-24 NOTE — Progress Notes (Signed)
PROGRESS NOTE    Madison Mcbride  XEN:[id number] DOB: 1956/05/01 DOA: 07/15/2021 PCP: Rusty Aus, MD     Brief Narrative:  Madison Mcbride is a 66 year old female with medical history of morbid obesity, OSA on CPAP, CHF, depression, anxiety, hyperlipidemia, neuropathy, diabetes mellitus type 2 presented to the ED with complaints of shortness of breath.  As per patient she forgot to take her fluid medications for past 2 days.  She does not always wear her BiPAP machine at night. In the ED initially SPO2 was 85% on room air improved to 97% on 2 L of oxygen via nasal cannula.  Patient received furosemide 60 mg IV in the ED. ABG showed PCO2 of 76, pH 7.21.  Pulmonology was consulted, patient placed on BiPAP. Cardiology also was consulted for guidance regarding diuresis for CHF.  New events last 24 hours / Subjective: Continues to have pain in her right fifth finger.  Still has some shortness of breath especially with positioning  Assessment & Plan:   Principal Problem:   Acute exacerbation of CHF (congestive heart failure) (HCC) Active Problems:   Hypothyroidism   Morbid obesity with BMI of 50.0-59.9, adult (HCC)   Acute respiratory failure with hypoxia and hypercapnia (HCC)   HLD (hyperlipidemia)   Depression   OSA (obstructive sleep apnea)   Acute hypoxemic respiratory failure (HCC)   Benign essential hypertension   Stage 3a chronic kidney disease (HCC)   Acute hypoxemic and hypercarbic respiratory failure -Secondary to CHF exacerbation, OSA/OHS -Continue BiPAP nightly and during naps -Pulmonology signed off 1/9   Acute on chronic diastolic CHF exacerbation -Appreciate cardiology following, follow-up with outpatient cardiologist Dr. 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 -Appreciate orthopedic surgery   DVT prophylaxis: Lovenox  Place TED hose Start: 07/20/21 1207 Place TED hose Start: 07/15/21 2330  Code Status: Full Family Communication: No family at bedside Disposition Plan:  Status is: Inpatient  Remains inpatient appropriate because: SNF placement pending     Antimicrobials:  Anti-infectives (From admission, onward)    Start     Dose/Rate Route Frequency Ordered Stop   07/17/21 0900  cefTRIAXone (ROCEPHIN) 2 g in sodium chloride 0.9 % 100 mL IVPB  Status:  Discontinued        2 g 200 mL/hr over 30 Minutes Intravenous Every 24 hours 07/17/21 0850 07/19/21 1013        Objective: Vitals:   07/24/21 1000 07/24/21 1100 07/24/21 1200 07/24/21 1300  BP:   128/63   Pulse: 66 60 60 66  Resp: 18 (!) 23 (!) 21 17  Temp:   99.1 F (37.3 C)   TempSrc:   Oral   SpO2: 91% 96% 94% 96%  Weight:      Height:        Intake/Output Summary (Last 24 hours) at 07/24/2021 1400 Last data filed at 07/24/2021 1200 Gross per 24 hour  Intake 360 ml  Output 2650 ml  Net -2290 ml    Filed Weights   07/22/21 0500 07/23/21 0319 07/24/21 0500  Weight: (!) 156 kg (!) 157.7  kg (!) 149.3 kg    Examination:  General exam: Appears calm and comfortable  Respiratory system: Diminished breath sounds, respiratory effort is normal on 3 L nasal cannula O2 Cardiovascular system: S1 & S2 heard, RRR. No murmurs.  Trace pedal edema. Gastrointestinal system: Abdomen is nondistended, soft and nontender. Normal bowel sounds heard. Central nervous system: Alert and oriented. No focal neurological deficits. Speech clear.  Extremities: Right hand  with second third fourth and fifth digit PIP and DIP joint effusions, some tenderness to palpation Psychiatry: Judgement and insight appear normal. Mood & affect appropriate.   Data Reviewed: I have personally reviewed following labs and imaging studies  CBC: Recent Labs  Lab 07/19/21 0439 07/20/21 0309 07/21/21 0454  WBC 11.6* 9.5 9.4  HGB 11.4* 10.9* 10.7*  HCT 40.2 37.8 38.2  MCV 87.0 87.5 87.2  PLT 325 318 657    Basic Metabolic Panel: Recent Labs  Lab 07/19/21 0439 07/20/21 0309 07/21/21 0454 07/22/21 0426 07/23/21 0610 07/24/21 0440  NA 139 139 139 138 141 141  K 4.5 4.4 4.1 3.9 3.6 3.7  CL 100 99 99 96* 97* 93*  CO2 31 32 33* 34* 35* 39*  GLUCOSE 90 81 78 78 80 95  BUN 51* 45* 35* 30* 21 21  CREATININE 1.97* 1.52* 1.29* 1.25* 0.98 1.00  CALCIUM 7.9* 8.2* 8.4* 8.3* 8.2* 8.3*  MG 2.2 2.3 2.3  --   --   --   PHOS 5.2* 4.2 3.0  --   --   --     GFR: Estimated Creatinine Clearance: 80.9 mL/min (by C-G formula based on SCr of 1 mg/dL). Liver Function Tests: Recent Labs  Lab 07/19/21 0439  AST 71*  ALT 68*  ALKPHOS 108  BILITOT 1.2  PROT 6.7  ALBUMIN 3.0*    No results for input(s): LIPASE, AMYLASE in the last 168 hours. No results for input(s): AMMONIA in the last 168 hours. Coagulation Profile: No results for input(s): INR, PROTIME in the last 168 hours. Cardiac Enzymes: No results for input(s): CKTOTAL, CKMB, CKMBINDEX, TROPONINI in the last 168 hours. BNP (last 3 results) No results for input(s): PROBNP in the last 8760 hours. HbA1C: No results for input(s): HGBA1C in the last 72 hours. CBG: Recent Labs  Lab 07/18/21 0524 07/23/21 1936  GLUCAP 89 106*    Lipid Profile: No results for input(s): CHOL, HDL, LDLCALC, TRIG, CHOLHDL, LDLDIRECT in the last 72 hours. Thyroid Function Tests: No results for input(s): TSH, T4TOTAL, FREET4, T3FREE, THYROIDAB in the last 72 hours. Anemia Panel: No results for input(s): VITAMINB12, FOLATE, FERRITIN,  TIBC, IRON, RETICCTPCT in the last 72 hours. Sepsis Labs: No results for input(s): PROCALCITON, LATICACIDVEN in the last 168 hours.  Recent Results (from the past 240 hour(s))  Resp Panel by RT-PCR (Flu A&B, Covid) Nasopharyngeal Swab     Status: None   Collection Time: 07/15/21 10:29 PM   Specimen: Nasopharyngeal Swab; Nasopharyngeal(NP) swabs in vial transport medium  Result Value Ref Range Status   SARS Coronavirus 2 by RT PCR NEGATIVE NEGATIVE Final    Comment: (NOTE) SARS-CoV-2 target nucleic acids are NOT DETECTED.  The SARS-CoV-2 RNA is generally detectable in upper respiratory specimens during the acute phase of infection. The lowest concentration of SARS-CoV-2 viral copies this assay can detect is 138 copies/mL. A negative result does not preclude SARS-Cov-2 infection and should not be used as the sole basis for treatment or other patient management decisions. A negative result may occur with  improper  specimen collection/handling, submission of specimen other than nasopharyngeal swab, presence of viral mutation(s) within the areas targeted by this assay, and inadequate number of viral copies(<138 copies/mL). A negative result must be combined with clinical observations, patient history, and epidemiological information. The expected result is Negative.  Fact Sheet for Patients:  EntrepreneurPulse.com.au  Fact Sheet for Healthcare Providers:  IncredibleEmployment.be  This test is no t yet approved or cleared by the [place] FDA and  has been authorized for detection and/or diagnosis of SARS-CoV-2 by FDA under an Emergency Use Authorization (EUA). This EUA will remain  in effect (meaning this test can be used) for the duration of the COVID-19 declaration under Section 564(b)(1) of the Act, 21 U.S.C.section 360bbb-3(b)(1), unless the authorization is terminated  or revoked sooner.       Influenza A by PCR NEGATIVE NEGATIVE Final    Influenza B by PCR NEGATIVE NEGATIVE Final    Comment: (NOTE) The Xpert Xpress SARS-CoV-2/FLU/RSV plus assay is intended as an aid in the diagnosis of influenza from Nasopharyngeal swab specimens and should not be used as a sole basis for treatment. Nasal washings and aspirates are unacceptable for Xpert Xpress SARS-CoV-2/FLU/RSV testing.  Fact Sheet for Patients: EntrepreneurPulse.com.au  Fact Sheet for Healthcare Providers: IncredibleEmployment.be  This test is not yet approved or cleared by the [place] FDA and has been authorized for detection and/or diagnosis of SARS-CoV-2 by FDA under an Emergency Use Authorization (EUA). This EUA will remain in effect (meaning this test can be used) for the duration of the COVID-19 declaration under Section 564(b)(1) of the Act, 21 U.S.C. section 360bbb-3(b)(1), unless the authorization is terminated or revoked.  Performed at [facility], [place], [place] [address]   Respiratory (~20 pathogens) panel by PCR     Status: None   Collection Time: 07/16/21  9:40 PM   Specimen: Nasopharyngeal Swab; Respiratory  Result Value Ref Range Status   Adenovirus NOT DETECTED NOT DETECTED Final   Coronavirus 229E NOT DETECTED NOT DETECTED Final    Comment: (NOTE) The Coronavirus on the Respiratory Panel, DOES NOT test for the novel  Coronavirus (2019 nCoV)    Coronavirus HKU1 NOT DETECTED NOT DETECTED Final   Coronavirus NL63 NOT DETECTED NOT DETECTED Final   Coronavirus OC43 NOT DETECTED NOT DETECTED Final   Metapneumovirus NOT DETECTED NOT DETECTED Final   Rhinovirus / Enterovirus NOT DETECTED NOT DETECTED Final   Influenza A NOT DETECTED NOT DETECTED Final   Influenza B NOT DETECTED NOT DETECTED Final   Parainfluenza Virus 1 NOT DETECTED NOT DETECTED Final   Parainfluenza Virus 2 NOT DETECTED NOT DETECTED Final   Parainfluenza Virus 3 NOT DETECTED NOT DETECTED Final    Parainfluenza Virus 4 NOT DETECTED NOT DETECTED Final   Respiratory Syncytial Virus NOT DETECTED NOT DETECTED Final   Bordetella pertussis NOT DETECTED NOT DETECTED Final   Bordetella Parapertussis NOT DETECTED NOT DETECTED Final   Chlamydophila pneumoniae NOT DETECTED NOT DETECTED Final   Mycoplasma pneumoniae NOT DETECTED NOT DETECTED Final    Comment: Performed at [facility], [place]. [address], [place], [place] [address]  MRSA Next Gen by PCR, Nasal     Status: None   Collection Time: 07/18/21  4:52 AM   Specimen: Nasal Mucosa; Nasal Swab  Result Value Ref Range Status   MRSA by PCR Next Gen NOT DETECTED NOT DETECTED Final    Comment: (NOTE) The GeneXpert MRSA Assay (FDA approved for NASAL specimens only), is one  component of a comprehensive MRSA colonization surveillance program. It is not intended to diagnose MRSA infection nor to guide or monitor treatment for MRSA infections. Test performance is not FDA approved in patients less than 14 years old. Performed at [facility], [address], [place], [place] [address]        Radiology Studies: DG Hand Complete Right  Result Date: 07/23/2021 CLINICAL DATA:  Arthritis, joint effusion of the hand EXAM: RIGHT HAND - COMPLETE 3+ VIEW COMPARISON:  None. FINDINGS: Marginal erosions especially at the index DIP joint, middle finger PIP joint, and little finger PIP joint. Associated soft tissue swelling. No visible involvement of the carpus or wrist. Dislocated appearance of the fifth PIP joint. IMPRESSION: Erosive arthropathy involving the interphalangeal joints, worse at the little finger PIP joint where there is dislocation. Electronically Signed   By: Jorje Guild M.D.   On: 07/23/2021 09:50      Scheduled Meds:  atorvastatin  20 mg Oral Q2200   buPROPion  150 mg Oral Daily   Chlorhexidine Gluconate Cloth  6 each Topical Daily   docusate sodium  100 mg Oral Daily   enoxaparin (LOVENOX) injection  0.5 mg/kg  Subcutaneous Q24H   fluticasone  1 spray Each Nare Daily   gabapentin  100 mg Oral Q1200   hydrocerin   Topical Daily   levothyroxine  125 mcg Oral Daily   midodrine  2.5 mg Oral TID WC   montelukast  10 mg Oral Daily   pantoprazole  40 mg Oral Daily   polyethylene glycol  17 g Oral Daily   rOPINIRole  3 mg Oral TID   sodium chloride flush  3 mL Intravenous Q12H   torsemide  40 mg Oral Daily   Continuous Infusions:  sodium chloride       LOS: 7 days      Time spent: 25 minutes   Dessa Phi, DO [facility] 07/24/2021, 2:00 PM   Available via Epic secure chat 7am-7pm After these hours, please refer to coverage provider listed on [email or web address]

## 2021-07-24 NOTE — Consult Note (Signed)
ORTHOPAEDIC CONSULTATION  REQUESTING PHYSICIAN: Dessa Phi, DO  Chief Complaint:   Right hand pain, especially involving the right little finger  History of Present Illness: Madison Mcbride is a 66 y.o. female with multiple medical problems including diabetes, obesity, sleep apnea requiring CPAP, hypothyroidism, anxiety/depression neuropathy, and congestive heart failure who was admitted 8 days ago for acute exacerbation of her congestive heart failure.  Apparently, several days ago, well in the ICU, she complained of right hand pain.  X-rays of her right hand were obtained yesterday.  These films demonstrated what appeared to be a dislocated PIP joint of the right little finger, so orthopedic consultation was requested.  The patient notes moderate pain in her finger which is aggravated by attempted range of motion as well as if she bumps it.  She notes that her finger appears to be straighter now than it was a month ago when it was "sticking out to the side".  She does not recall any injury to the finger, and denies any numbness or paresthesias to the fingertips.  She also denies any attempt at seeking treatment for her hand pain prior to now.  Past Medical History:  Diagnosis Date   CHF (congestive heart failure) ([place])    Diabetes mellitus without complication ([place])    Hypothyroidism    Lymphedema    RLS (restless legs syndrome)    Sleep apnea    Thyroid cancer ([place]) 2007   Past Surgical History:  Procedure Laterality Date   ABDOMINAL HYSTERECTOMY     COLONOSCOPY WITH PROPOFOL N/A 08/26/2015   Procedure: COLONOSCOPY WITH PROPOFOL;  Surgeon: Manya Silvas, MD;  Location: [facility];  Service: Endoscopy;  Laterality: N/A;   THYROIDECTOMY     Social History   Socioeconomic History   Marital status: Married    Spouse name: Not on file   Number of children: Not on file   Years of education: Not on file    Highest education level: Not on file  Occupational History   Not on file  Tobacco Use   Smoking status: Never   Smokeless tobacco: Never  Substance and Sexual Activity   Alcohol use: No   Drug use: No   Sexual activity: Not on file  Other Topics Concern   Not on file  Social History Narrative   Not on file   Social Determinants of Health   Financial Resource Strain: Not on file  Food Insecurity: Not on file  Transportation Needs: Not on file  Physical Activity: Not on file  Stress: Not on file  Social Connections: Not on file   Family History  Problem Relation Age of Onset   Breast cancer Neg Hx    No Known Allergies Prior to Admission medications   Medication Sig Start Date End Date Taking? Authorizing Provider  atorvastatin (LIPITOR) 20 MG tablet Take 20 mg by mouth daily. 01/31/20  Yes [provider]  Biotin 2.5 MG CAPS Take 2.5 mg by mouth daily.    Yes [provider]  buPROPion (WELLBUTRIN XL) 150 MG 24 hr tablet Take 150 mg by mouth daily. 01/10/21  Yes [provider]  carvedilol (COREG) 6.25 MG tablet Take 1 tablet (6.25 mg total) by mouth 2 (two) times daily with a meal. 04/14/21  Yes Darylene Price A, FNP  Cholecalciferol 25 MCG (1000 UT) tablet Take 1 tablet by mouth daily.   Yes [provider]  gabapentin 50 MG TABS Take 300 mg by mouth 3 (three) times daily.  Patient taking differently: Take 300 mg by mouth 3 (three) times daily. 2 caps in am , 1 cap at noon and 2 caps at bedtime 03/24/21  Yes Max Sane, MD  levothyroxine (SYNTHROID) 125 MCG tablet Take 250 mcg by mouth daily. 11/11/20 11/12/21 Yes [provider]  Multiple Vitamin (MULTIVITAMIN) tablet Take 1 tablet by mouth daily.   Yes [provider]  pantoprazole (PROTONIX) 40 MG tablet Take 40 mg by mouth daily. 01/11/21  Yes [provider]  potassium chloride SA (KLOR-CON) 20 MEQ tablet Take 1 tablet (20 mEq total) by mouth daily. 04/14/21  Yes  Hackney, Otila Kluver A, FNP  propranolol (INDERAL) 20 MG tablet Take 20 mg by mouth 3 (three) times daily. 06/15/21  Yes [provider]  rOPINIRole (REQUIP) 3 MG tablet Take 3 mg by mouth 3 (three) times daily. 01/20/18  Yes [provider]  Semaglutide, 1 MG/DOSE, 2 MG/1.5ML SOPN Inject 0.75 mLs into the skin once a week. 10/14/20  Yes [provider]  torsemide (DEMADEX) 20 MG tablet Take 2 tablets (40 mg total) by mouth daily. 04/14/21  Yes Darylene Price A, FNP  zinc gluconate 50 MG tablet Take 50 mg by mouth daily.   Yes [provider]  atorvastatin (LIPITOR) 20 MG tablet Take 20 mg by mouth daily at 10 pm. 01/31/20 01/30/21  [provider]  montelukast (SINGULAIR) 10 MG tablet Take 10 mg by mouth daily. Patient not taking: Reported on 07/16/2021 02/24/21 02/24/22  [provider]   DG Hand Complete Right  Result Date: 07/23/2021 CLINICAL DATA:  Arthritis, joint effusion of the hand EXAM: RIGHT HAND - COMPLETE 3+ VIEW COMPARISON:  None. FINDINGS: Marginal erosions especially at the index DIP joint, middle finger PIP joint, and little finger PIP joint. Associated soft tissue swelling. No visible involvement of the carpus or wrist. Dislocated appearance of the fifth PIP joint. IMPRESSION: Erosive arthropathy involving the interphalangeal joints, worse at the little finger PIP joint where there is dislocation. Electronically Signed   By: Jorje Guild M.D.   On: 07/23/2021 09:50    Positive ROS: All other systems have been reviewed and were otherwise negative with the exception of those mentioned in the HPI and as above.  Physical Exam: General:  Alert, no acute distress Psychiatric:  Patient is competent for consent with normal mood and affect   Cardiovascular:  No pedal edema Respiratory:  No wheezing, non-labored breathing GI:  Abdomen is soft and non-tender Skin:  No lesions in the area of chief complaint Neurologic:  Sensation intact  distally Lymphatic:  No axillary or cervical lymphadenopathy  Orthopedic Exam:  Orthopedic examination is limited to the right hand.  Skin inspection is notable for some swelling over the little MCP joint more so than over the long, ring, and index MCP joints.  She has moderate tenderness to palpation over the PIP joint and there does appear to be some palpable deformity of this joint.  She has pain with attempted active or passive motion of the right little PIP joint.  She also has mild tenderness to palpation over the other PIP joints, as well as with active or passive motion of these joints.  However, she is able to flex and extend these fingers freely without catching or triggering.  She has limited motion of the PIP joint of the right little finger.  She is neurovascular intact to the tips of all digits.  X-rays:  Recent x-rays of her right hand are available for  review and have been reviewed by myself.  These films demonstrate erosive changes of multiple joints, including the middle PIP joint, the little DIP joint, and the index DIP joint with associated soft tissue swelling.  There also is evidence of a dislocation of the PIP joint of the right little finger.  No fractures or other acute bony changes are identified.  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  Plan: The treatment options have been discussed with the patient.  The patient is agreeable to an attempted closed reduction of the right little PIP joint under a digital block.  This procedure will be attempted later, either by myself or by my PA, Cameron Proud.  Meanwhile, the patient may continue with her normal daily activities, but is to avoid offending activities.  She may take pain medications as deemed appropriate medically for discomfort.  Thank you for asking me to participate in the care of this most pleasant yet unfortunate woman.  I  will be happy to follow her with you.   Pascal Lux, MD  Beeper #:  [phone]  07/24/2021 1:43 PM

## 2021-07-24 NOTE — TOC Progression Note (Signed)
Transition of Care [facility]) - Progression Note    Patient Details  Name: Madison Mcbride MRN: [id number] Date of Birth: 04-26-1956  Transition of Care [facility]) CM/SW Contact  Shelbie Hutching, RN Phone Number: 07/24/2021, 3:15 PM  Clinical Narrative:    [facility] has offered a bed and bed offer accepted by patient and husband.  MD reports that patient may be medically stable enough for discharge tomorrow.  RNCM starting insurance auth through [place].     Expected Discharge Plan: [place] Barriers to Discharge: Continued Medical Work up, SNF Pending bed offer  Expected Discharge Plan and Services Expected Discharge Plan: [place] In-house Referral: Clinical Social Work   Post Acute Care Choice: [place] Living arrangements for the past 2 months: Single Family Home                                       Social Determinants of Health (SDOH) Interventions    Readmission Risk Interventions No flowsheet data found.

## 2021-07-25 ENCOUNTER — Other Ambulatory Visit: Payer: Self-pay

## 2021-07-25 DIAGNOSIS — I4891 Unspecified atrial fibrillation: Secondary | ICD-10-CM

## 2021-07-25 LAB — MAGNESIUM: Magnesium: 1.8 mg/dL (ref 1.7–2.4)

## 2021-07-25 MED ORDER — APIXABAN 5 MG PO TABS
5.0000 mg | ORAL_TABLET | Freq: Two times a day (BID) | ORAL | Status: DC
  Administered 2021-07-25 – 2021-07-29 (×8): 5 mg via ORAL
  Filled 2021-07-25 (×8): qty 1

## 2021-07-25 MED ORDER — APIXABAN 5 MG PO TABS: 5.0000 mg | ORAL_TABLET | Freq: Two times a day (BID) | ORAL | 0 refills | Status: AC

## 2021-07-25 MED ORDER — MIDODRINE HCL 2.5 MG PO TABS
2.5000 mg | ORAL_TABLET | Freq: Three times a day (TID) | ORAL | 0 refills | Status: DC
Start: 2021-07-25 — End: 2021-07-28

## 2021-07-25 MED ORDER — POTASSIUM CHLORIDE CRYS ER 20 MEQ PO TBCR
30.0000 meq | EXTENDED_RELEASE_TABLET | Freq: Once | ORAL | Status: AC
  Administered 2021-07-25: 30 meq via ORAL
  Filled 2021-07-25: qty 2

## 2021-07-25 MED ORDER — LEVOTHYROXINE SODIUM 125 MCG PO TABS: 125.0000 ug | ORAL_TABLET | Freq: Every day | ORAL | 0 refills | Status: DC

## 2021-07-25 MED ORDER — POLYVINYL ALCOHOL 1.4 % OP SOLN
1.0000 [drp] | OPHTHALMIC | Status: DC | PRN
  Administered 2021-07-25 – 2021-07-28 (×2): 1 [drp] via OPHTHALMIC
  Filled 2021-07-25: qty 15

## 2021-07-25 NOTE — TOC Progression Note (Signed)
Transition of Care [facility]) - Progression Note    Patient Details  Name: Earnie Rockhold MRN: [id number] Date of Birth: 11/16/1955  [facility] of [facility]) CM/SW Contact  Anselm Pancoast, RN Phone Number: 07/25/2021, 5:40 PM  Clinical Narrative:    Discharge cancelled for today as patient has been in [place] at hospital however husband reports patient is on Trilogy at home. SNF is not able to accomodate Trilogy and patient will need to discharge with Bipap. Facility will order Bipap and plan for discharge once Bipap available at facility. Facility will have Bipap on Monday 07/28/21. Bipap setting sent to facility for order to be placed.    Expected Discharge Plan: [place] Barriers to Discharge: Continued Medical Work up, SNF Pending bed offer  Expected Discharge Plan and Services Expected Discharge Plan: [place] In-house Referral: Clinical Social Work   Post Acute Care Choice: [place] arrangements for the past 2 months: Single Family Home Expected Discharge Date: 07/25/21                                     Social Determinants of Health (SDOH) Interventions    Readmission Risk Interventions No flowsheet data found.

## 2021-07-25 NOTE — Discharge Summary (Signed)
Physician Discharge Summary  Madison Mcbride SHF:[id number] DOB: 1955-12-09 DOA: 07/15/2021  PCP: Rusty Aus, MD  Admit date: 07/15/2021 Discharge date: 07/25/2021  Admitted From: Home Disposition:  SNF   Recommendations for Outpatient Follow-up:  Follow up with PCP in 1 week Follow up with Cardiology Follow-up with Dr. Roland Rack, orthopedic surgery  Discharge Condition: Stable CODE STATUS: Full  Diet recommendation: Heart healthy   Brief/Interim Summary: Madison Mcbride is a 66 year old female with medical history of morbid obesity, OSA on CPAP, CHF, depression, anxiety, hyperlipidemia, neuropathy, diabetes mellitus type 2 presented to the ED with complaints of shortness of breath.  As per patient she forgot to take her fluid medications for past 2 days.  She does not always wear her BiPAP machine at night. In the ED initially SPO2 was 85% on room air improved to 97% on 2 L of oxygen via nasal cannula.  Patient received furosemide 60 mg IV in the ED. ABG showed PCO2 of 76, pH 7.21.  Pulmonology was consulted, patient placed on BiPAP. Cardiology also was consulted for guidance regarding diuresis for CHF.  Patient continued to do well, was requiring BiPAP nightly, diuretic therapy changed to Demadex.  AKI resolved.  Patient had some issues with her right fifth finger and x-ray revealed dislocation which orthopedic surgery reduced during hospitalization.  Discharge Diagnoses:  Principal Problem:   Acute exacerbation of CHF (congestive heart failure) (HCC) Active Problems:   Hypothyroidism   Morbid obesity with BMI of 50.0-59.9, adult (HCC)   Acute respiratory failure with hypoxia and hypercapnia (HCC)   HLD (hyperlipidemia)   Depression   OSA (obstructive sleep apnea)   Acute hypoxemic respiratory failure (HCC)   Benign essential hypertension   Stage 3a chronic kidney disease (HCC)   Acute hypoxemic and hypercarbic respiratory failure -Secondary to CHF exacerbation,  OSA/OHS -Pulmonology signed off 1/9  -Continue BiPAP nightly and during naps   Acute on chronic diastolic CHF exacerbation -Appreciate cardiology following, follow-up with outpatient cardiologist Dr. Nehemiah Massed in 1 to 2 weeks -Continue demadex  -Beta-blocker discontinued due to bradycardia  Paroxysmal A. fib, new onset -Brief episode of A. 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  Apply thin layer of Eucerin cream (do not apply between toes). Cover open lesions with folded pieces of Xeroform gauze Kellie Simmering # 294), top with ABD pad and secure by wrapping with Kerlix roll gauze.  Float heels off of the bed surface.   Increase activity slowly   Complete by: As directed       Allergies as of 07/25/2021   No Known Allergies  Medication List     STOP taking these medications    carvedilol 6.25 MG tablet Commonly known as: COREG   Gabapentin 50 MG Tabs   montelukast 10 MG tablet Commonly known as: SINGULAIR   potassium chloride SA 20 MEQ tablet Commonly  known as: KLOR-CON M   propranolol 20 MG tablet Commonly known as: INDERAL       TAKE these medications    apixaban 5 MG Tabs tablet Commonly known as: ELIQUIS Take 1 tablet (5 mg total) by mouth 2 (two) times daily.   atorvastatin 20 MG tablet Commonly known as: LIPITOR Take 20 mg by mouth daily. What changed: Another medication with the same name was removed. Continue taking this medication, and follow the directions you see here.   Biotin 2.5 MG Caps Take 2.5 mg by mouth daily.   buPROPion 150 MG 24 hr tablet Commonly known as: WELLBUTRIN XL Take 150 mg by mouth daily.   Cholecalciferol 25 MCG (1000 UT) tablet Take 1 tablet by mouth daily.   levothyroxine 125 MCG tablet Commonly known as: SYNTHROID Take 1 tablet (125 mcg total) by mouth daily. Start taking on: July 26, 2021 What changed: how much to take   midodrine 2.5 MG tablet Commonly known as: PROAMATINE Take 1 tablet (2.5 mg total) by mouth 3 (three) times daily with meals.   multivitamin tablet Take 1 tablet by mouth daily.   pantoprazole 40 MG tablet Commonly known as: PROTONIX Take 40 mg by mouth daily.   rOPINIRole 3 MG tablet Commonly known as: REQUIP Take 3 mg by mouth 3 (three) times daily.   Semaglutide (1 MG/DOSE) 2 MG/1.5ML Sopn Inject 0.75 mLs into the skin once a week.   torsemide 20 MG tablet Commonly known as: DEMADEX Take 2 tablets (40 mg total) by mouth daily.   zinc gluconate 50 MG tablet Take 50 mg by mouth daily.               Discharge Care Instructions  (From admission, onward)           Start     Ordered   07/25/21 0000  Discharge wound care:       Comments: Wound care to the bilateral LEs:  Cleanse legs with soap and water, rinse and pat dry.  Apply thin layer of Eucerin cream (do not apply between toes). Cover open lesions with folded pieces of Xeroform gauze Kellie Simmering # 294), top with ABD pad and secure by wrapping with Kerlix roll gauze.  Float heels off  of the bed surface.   07/25/21 1327            Contact information for follow-up providers     Rusty Aus, MD. Schedule an appointment as soon as possible for a visit in 1 week(s).   Specialty: Internal Medicine Contact information: [place] [address] [phone]         Corey Skains, MD. Schedule an appointment as soon as possible for a visit in 1 week(s).   Specialty: Cardiology Contact information: [address] [facility] [place] [address] [phone]         Poggi, Marshall Cork, MD Follow up.   Specialty: Orthopedic Surgery Contact information: [place] [address] [phone]              Contact information for after-discharge care     Destination  [facility] Preferred SNF .   Service: Skilled Nursing Contact information: [address] [place] [phone]                    No Known Allergies   Procedures/Studies: DG Chest 2 View  Result Date: 07/15/2021 CLINICAL DATA:  Dyspnea EXAM: CHEST - 2 VIEW COMPARISON:  03/14/2021 FINDINGS: Bibasilar opacification likely relates to overlying soft tissue. Lungs are clear. No pneumothorax or pleural effusion. Mild cardiomegaly is stable. Central pulmonary arterial enlargement is again noted in keeping with changes of pulmonary arterial hypertension. IMPRESSION: Stable cardiomegaly. Morphologic changes in keeping with pulmonary arterial hypertension. Electronically Signed   By: Fidela Salisbury M.D.   On: 07/15/2021 22:15   NM Pulmonary Perfusion  Result Date: 07/18/2021 CLINICAL DATA:  Shortness of breath, history CHF EXAM: NUCLEAR MEDICINE PERFUSION LUNG SCAN TECHNIQUE: Perfusion images were obtained in multiple projections after intravenous injection of radiopharmaceutical. Lateral views could not be  obtained due to body habitus. Ventilation scans intentionally deferred if perfusion scan and chest x-ray adequate for interpretation during COVID 19 epidemic. RADIOPHARMACEUTICALS:  4.46 mCi Tc-91m MAA IV COMPARISON:  04/15/2020 FINDINGS: Generally nonsegmental diminished perfusion in basilar portions of LEFT lower lobe. No wedge-shaped segmental or subsegmental perfusion defects identified. Chest radiograph demonstrates significant bibasilar pulmonary opacities and a RIGHT pleural effusion. IMPRESSION: Generally diminished perfusion in the basilar portions of the LEFT lower lobe in a nonsegmental pattern; no segmental or subsegmental perfusion defects identified to suggest pulmonary embolism. Electronically Signed   By: Lavonia Dana M.D.   On: 07/18/2021 15:00   US Venous Img Lower Bilateral (DVT)  Result Date: 07/20/2021 CLINICAL DATA:  Lower extremity edema EXAM: BILATERAL LOWER EXTREMITY VENOUS DOPPLER ULTRASOUND TECHNIQUE: Gray-scale sonography with graded compression, as well as color Doppler and duplex ultrasound were performed to evaluate the lower extremity deep venous systems from the level of the common femoral vein and including the common femoral, femoral, profunda femoral, popliteal and calf veins including the posterior tibial, peroneal and gastrocnemius veins when visible. The superficial great saphenous vein was also interrogated. Spectral Doppler was utilized to evaluate flow at rest and with distal augmentation maneuvers in the common femoral, femoral and popliteal veins. COMPARISON:  None. FINDINGS: RIGHT LOWER EXTREMITY Common Femoral Vein: No evidence of thrombus. Normal compressibility, respiratory phasicity and response to augmentation. Saphenofemoral Junction: No evidence of thrombus. Normal compressibility and flow on color Doppler imaging. Profunda Femoral Vein: No evidence of thrombus. Normal compressibility and flow on color Doppler imaging. Femoral Vein: No evidence of thrombus.  Normal compressibility, respiratory phasicity and response to augmentation. Popliteal Vein: No evidence of thrombus. Normal compressibility, respiratory phasicity and response to augmentation. Calf Veins: Unable to visualize the calf veins secondary to patient body habitus. Superficial Great Saphenous Vein: No evidence of thrombus. Normal compressibility. Venous Reflux:  None. Other Findings:  None. LEFT LOWER EXTREMITY Common Femoral Vein: No evidence of thrombus. Normal compressibility, respiratory phasicity and response to augmentation. Saphenofemoral Junction: No evidence of thrombus. Normal compressibility and flow on color Doppler imaging. Profunda Femoral Vein: No evidence of thrombus. Normal compressibility and flow on color Doppler imaging. Femoral Vein: No evidence of thrombus. Normal compressibility, respiratory phasicity and response to augmentation. Popliteal Vein: No evidence of thrombus. Normal compressibility, respiratory phasicity and response to augmentation. Calf Veins: Unable to visualize the calf veins secondary to patient body habitus. Superficial Great Saphenous Vein: No evidence of thrombus. Normal compressibility. Venous Reflux:  None. Other Findings:  None. IMPRESSION: Technically challenging and limited examination secondary to patient body habitus. No evidence of deep venous thrombosis to the level of the knees bilaterally. The calf veins are not well seen in either lower extremity. Electronically Signed   By: Jacqulynn Cadet M.D.   On: 07/20/2021 06:31   DG Chest Port 1 View  Result Date: 07/19/2021 CLINICAL DATA:  66 year old female with shortness of breath. EXAM: PORTABLE CHEST 1 VIEW COMPARISON:  Portable chest 07/18/2021 and earlier. FINDINGS: Portable AP upright view at 0417 hours. Stable cardiomegaly and mediastinal contours. Continued somewhat low lung volumes. Diffuse indistinct appearance of the pulmonary vasculature and somewhat symmetric veiling lower lung opacity. No air  bronchograms. No pneumothorax. Visualized tracheal air column is within normal limits. No acute osseous abnormality identified. IMPRESSION: Unchanged ventilation from yesterday with suspected pulmonary interstitial edema and small pleural effusions. Underlying cardiomegaly. Electronically Signed   By: Genevie Ann M.D.   On: 07/19/2021 04:56   DG Chest Port 1 View  Result Date: 07/18/2021 CLINICAL DATA:  Dyspnea EXAM: PORTABLE CHEST 1 VIEW COMPARISON:  Chest x-ray 07/15/2021 FINDINGS: Cardiomegaly and mediastinum appear unchanged. Pulmonary vascular congestion with increasing opacities in the lower lung zones since previous study. No large pleural effusion appreciated. No pneumothorax. IMPRESSION: Cardiomegaly with increasing pulmonary vascular congestion and lower lung zone opacities which may represent edema. Electronically Signed   By: Ofilia Neas M.D.   On: 07/18/2021 10:06   DG Hand Complete Right  Result Date: 07/23/2021 CLINICAL DATA:  Arthritis, joint effusion of the hand EXAM: RIGHT HAND - COMPLETE 3+ VIEW COMPARISON:  None. FINDINGS: Marginal erosions especially at the index DIP joint, middle finger PIP joint, and little finger PIP joint. Associated soft tissue swelling. No visible involvement of the carpus or wrist. Dislocated appearance of the fifth PIP joint. IMPRESSION: Erosive arthropathy involving the interphalangeal joints, worse at the little finger PIP joint where there is dislocation. Electronically Signed   By: Jorje Guild M.D.   On: 07/23/2021 09:50   DG Finger Little Right  Result Date: 07/24/2021 CLINICAL DATA:  History of dislocation, finger pain EXAM: RIGHT LITTLE FINGER 2+V COMPARISON:  07/23/2021 FINDINGS: No frank dislocation at the fifth IP joint, there is minimal radial subluxation head of the proximal phalanx with respect to the base of the middle phalanx. Erosive arthropathy at the IP joint. IMPRESSION: Erosive arthropathy at the fifth IP joint. No frank dislocation,  there is minimal radial subluxation of the head of the proximal phalanx with respect to the middle phalanx. Electronically Signed   By: Donavan Foil M.D.   On: 07/24/2021 17:58       Discharge Exam: Vitals:   07/25/21 1140 07/25/21 1200  BP: (!) 103/51 121/65  Pulse: 65 61  Resp: 14 17  Temp:  98.5 F (36.9 C)  SpO2: 96% 97%    General: Pt is alert, awake, not in acute distress Cardiovascular: RRR, S1/S2 +, trace edema Respiratory: CTA bilaterally anteriorly, no wheezing, no rhonchi, no respiratory distress, no conversational dyspnea, on 2L O2  Abdominal: Soft, NT, ND, bowel sounds + Psych: Normal mood and affect, stable judgement and insight     The results of significant diagnostics from this hospitalization (including imaging, microbiology, ancillary and laboratory) are listed below for reference.     Microbiology: Recent Results (from the past 240 hour(s))  Resp Panel by RT-PCR (Flu A&B, Covid) Nasopharyngeal Swab     Status: None   Collection Time: 07/15/21 10:29 PM   Specimen: Nasopharyngeal  Swab; Nasopharyngeal(NP) swabs in vial transport medium  Result Value Ref Range Status   SARS Coronavirus 2 by RT PCR NEGATIVE NEGATIVE Final    Comment: (NOTE) SARS-CoV-2 target nucleic acids are NOT DETECTED.  The SARS-CoV-2 RNA is generally detectable in upper respiratory specimens during the acute phase of infection. The lowest concentration of SARS-CoV-2 viral copies this assay can detect is 138 copies/mL. A negative result does not preclude SARS-Cov-2 infection and should not be used as the sole basis for treatment or other patient management decisions. A negative result may occur with  improper specimen collection/handling, submission of specimen other than nasopharyngeal swab, presence of viral mutation(s) within the areas targeted by this assay, and inadequate number of viral copies(<138 copies/mL). A negative result must be combined with clinical observations,  patient history, and epidemiological information. The expected result is Negative.  Fact Sheet for Patients:  EntrepreneurPulse.com.au  Fact Sheet for Healthcare Providers:  IncredibleEmployment.be  This test is no t yet approved or cleared by the [place] FDA and  has been authorized for detection and/or diagnosis of SARS-CoV-2 by FDA under an Emergency Use Authorization (EUA). This EUA will remain  in effect (meaning this test can be used) for the duration of the COVID-19 declaration under Section 564(b)(1) of the Act, 21 U.S.C.section 360bbb-3(b)(1), unless the authorization is terminated  or revoked sooner.       Influenza A by PCR NEGATIVE NEGATIVE Final   Influenza B by PCR NEGATIVE NEGATIVE Final    Comment: (NOTE) The Xpert Xpress SARS-CoV-2/FLU/RSV plus assay is intended as an aid in the diagnosis of influenza from Nasopharyngeal swab specimens and should not be used as a sole basis for treatment. Nasal washings and aspirates are unacceptable for Xpert Xpress SARS-CoV-2/FLU/RSV testing.  Fact Sheet for Patients: EntrepreneurPulse.com.au  Fact Sheet for Healthcare Providers: IncredibleEmployment.be  This test is not yet approved or cleared by the [place] FDA and has been authorized for detection and/or diagnosis of SARS-CoV-2 by FDA under an Emergency Use Authorization (EUA). This EUA will remain in effect (meaning this test can be used) for the duration of the COVID-19 declaration under Section 564(b)(1) of the Act, 21 U.S.C. section 360bbb-3(b)(1), unless the authorization is terminated or revoked.  Performed at [facility], [place], [place] [address]   Respiratory (~20 pathogens) panel by PCR     Status: None   Collection Time: 07/16/21  9:40 PM   Specimen: Nasopharyngeal Swab; Respiratory  Result Value Ref Range Status   Adenovirus NOT DETECTED NOT  DETECTED Final   Coronavirus 229E NOT DETECTED NOT DETECTED Final    Comment: (NOTE) The Coronavirus on the Respiratory Panel, DOES NOT test for the novel  Coronavirus (2019 nCoV)    Coronavirus HKU1 NOT DETECTED NOT DETECTED Final   Coronavirus NL63 NOT DETECTED NOT DETECTED Final   Coronavirus OC43 NOT DETECTED NOT DETECTED Final   Metapneumovirus NOT DETECTED NOT DETECTED Final   Rhinovirus / Enterovirus NOT DETECTED NOT DETECTED Final   Influenza A NOT DETECTED NOT DETECTED Final   Influenza B NOT DETECTED NOT DETECTED Final   Parainfluenza Virus 1 NOT DETECTED NOT DETECTED Final   Parainfluenza Virus 2 NOT DETECTED NOT DETECTED Final   Parainfluenza Virus 3 NOT DETECTED NOT DETECTED Final   Parainfluenza Virus 4 NOT DETECTED NOT DETECTED Final   Respiratory Syncytial Virus NOT DETECTED NOT DETECTED Final   Bordetella pertussis NOT DETECTED NOT DETECTED Final   Bordetella Parapertussis NOT DETECTED NOT DETECTED  Final   Chlamydophila pneumoniae NOT DETECTED NOT DETECTED Final   Mycoplasma pneumoniae NOT DETECTED NOT DETECTED Final    Comment: Performed at [facility], [place] [address], [place], [place] [address]  MRSA Next Gen by PCR, Nasal     Status: None   Collection Time: 07/18/21  4:52 AM   Specimen: Nasal Mucosa; Nasal Swab  Result Value Ref Range Status   MRSA by PCR Next Gen NOT DETECTED NOT DETECTED Final    Comment: (NOTE) The GeneXpert MRSA Assay (FDA approved for NASAL specimens only), is one component of a comprehensive MRSA colonization surveillance program. It is not intended to diagnose MRSA infection nor to guide or monitor treatment for MRSA infections. Test performance is not FDA approved in patients less than 59 years old. Performed at [facility], [address] [place], [place] [address]   Resp Panel by RT-PCR (Flu A&B, Covid) Nasopharyngeal Swab     Status: None   Collection Time: 07/24/21  4:14 PM   Specimen: Nasopharyngeal Swab;  Nasopharyngeal(NP) swabs in vial transport medium  Result Value Ref Range Status   SARS Coronavirus 2 by RT PCR NEGATIVE NEGATIVE Final    Comment: (NOTE) SARS-CoV-2 target nucleic acids are NOT DETECTED.  The SARS-CoV-2 RNA is generally detectable in upper respiratory specimens during the acute phase of infection. The lowest concentration of SARS-CoV-2 viral copies this assay can detect is 138 copies/mL. A negative result does not preclude SARS-Cov-2 infection and should not be used as the sole basis for treatment or other patient management decisions. A negative result may occur with  improper specimen collection/handling, submission of specimen other than nasopharyngeal swab, presence of viral mutation(s) within the areas targeted by this assay, and inadequate number of viral copies(<138 copies/mL). A negative result must be combined with clinical observations, patient history, and epidemiological information. The expected result is Negative.  Fact Sheet for Patients:  EntrepreneurPulse.com.au  Fact Sheet for Healthcare Providers:  IncredibleEmployment.be  This test is no t yet approved or cleared by the [place] FDA and  has been authorized for detection and/or diagnosis of SARS-CoV-2 by FDA under an Emergency Use Authorization (EUA). This EUA will remain  in effect (meaning this test can be used) for the duration of the COVID-19 declaration under Section 564(b)(1) of the Act, 21 U.S.C.section 360bbb-3(b)(1), unless the authorization is terminated  or revoked sooner.       Influenza A by PCR NEGATIVE NEGATIVE Final   Influenza B by PCR NEGATIVE NEGATIVE Final    Comment: (NOTE) The Xpert Xpress SARS-CoV-2/FLU/RSV plus assay is intended as an aid in the diagnosis of influenza from Nasopharyngeal swab specimens and should not be used as a sole basis for treatment. Nasal washings and aspirates are unacceptable for Xpert Xpress  SARS-CoV-2/FLU/RSV testing.  Fact Sheet for Patients: EntrepreneurPulse.com.au  Fact Sheet for Healthcare Providers: IncredibleEmployment.be  This test is not yet approved or cleared by the [place] FDA and has been authorized for detection and/or diagnosis of SARS-CoV-2 by FDA under an Emergency Use Authorization (EUA). This EUA will remain in effect (meaning this test can be used) for the duration of the COVID-19 declaration under Section 564(b)(1) of the Act, 21 U.S.C. section 360bbb-3(b)(1), unless the authorization is terminated or revoked.  Performed at [facility], [address] [place], [place] [address]      Labs: BNP (last 3 results) Recent Labs    03/14/21 1817 03/16/21 0819 07/15/21 2147  BNP 1,805.1* 1,352.0*  5,852.7*   Basic Metabolic Panel: Recent Labs  Lab 07/19/21 0439 07/20/21 0309 07/21/21 0454 07/22/21 0426 07/23/21 0610 07/24/21 0440 07/25/21 1055  NA 139 139 139 138 141 141  --   K 4.5 4.4 4.1 3.9 3.6 3.7  --   CL 100 99 99 96* 97* 93*  --   CO2 31 32 33* 34* 35* 39*  --   GLUCOSE 90 81 78 78 80 95  --   BUN 51* 45* 35* 30* 21 21  --   CREATININE 1.97* 1.52* 1.29* 1.25* 0.98 1.00  --   CALCIUM 7.9* 8.2* 8.4* 8.3* 8.2* 8.3*  --   MG 2.2 2.3 2.3  --   --   --  1.8  PHOS 5.2* 4.2 3.0  --   --   --   --    Liver Function Tests: Recent Labs  Lab 07/19/21 0439  AST 71*  ALT 68*  ALKPHOS 108  BILITOT 1.2  PROT 6.7  ALBUMIN 3.0*   No results for input(s): LIPASE, AMYLASE in the last 168 hours. No results for input(s): AMMONIA in the last 168 hours. CBC: Recent Labs  Lab 07/19/21 0439 07/20/21 0309 07/21/21 0454  WBC 11.6* 9.5 9.4  HGB 11.4* 10.9* 10.7*  HCT 40.2 37.8 38.2  MCV 87.0 87.5 87.2  PLT 325 318 290   Cardiac Enzymes: No results for input(s): CKTOTAL, CKMB, CKMBINDEX, TROPONINI in the last 168 hours. BNP: Invalid input(s): POCBNP CBG: Recent Labs  Lab  07/23/21 1936  GLUCAP 106*   D-Dimer No results for input(s): DDIMER in the last 72 hours. Hgb A1c No results for input(s): HGBA1C in the last 72 hours. Lipid Profile No results for input(s): CHOL, HDL, LDLCALC, TRIG, CHOLHDL, LDLDIRECT in the last 72 hours. Thyroid function studies No results for input(s): TSH, T4TOTAL, T3FREE, THYROIDAB in the last 72 hours.  Invalid input(s): FREET3 Anemia work up No results for input(s): VITAMINB12, FOLATE, FERRITIN, TIBC, IRON, RETICCTPCT in the last 72 hours. Urinalysis    Component Value Date/Time   COLORURINE YELLOW 03/22/2021 1320   APPEARANCEUR CLOUDY (A) 03/22/2021 1320   APPEARANCEUR CLOUDY 01/15/2014 0209   LABSPEC 1.020 03/22/2021 1320   LABSPEC 1.014 01/15/2014 0209   PHURINE 8.5 (H) 03/22/2021 1320   GLUCOSEU 100 (A) 03/22/2021 1320   GLUCOSEU NEGATIVE 01/15/2014 0209   HGBUR MODERATE (A) 03/22/2021 1320   BILIRUBINUR NEGATIVE 03/22/2021 1320   BILIRUBINUR NEGATIVE 01/15/2014 0209   KETONESUR NEGATIVE 03/22/2021 1320   PROTEINUR >300 (A) 03/22/2021 1320   NITRITE NEGATIVE 03/22/2021 1320   LEUKOCYTESUR SMALL (A) 03/22/2021 1320   LEUKOCYTESUR 2+ 01/15/2014 0209   Sepsis Labs Invalid input(s): PROCALCITONIN,  WBC,  LACTICIDVEN Microbiology Recent Results (from the past 240 hour(s))  Resp Panel by RT-PCR (Flu A&B, Covid) Nasopharyngeal Swab     Status: None   Collection Time: 07/15/21 10:29 PM   Specimen: Nasopharyngeal Swab; Nasopharyngeal(NP) swabs in vial transport medium  Result Value Ref Range Status   SARS Coronavirus 2 by RT PCR NEGATIVE NEGATIVE Final    Comment: (NOTE) SARS-CoV-2 target nucleic acids are NOT DETECTED.  The SARS-CoV-2 RNA is generally detectable in upper respiratory specimens during the acute phase of infection. The lowest concentration of SARS-CoV-2 viral copies this assay can detect is 138 copies/mL. A negative result does not preclude SARS-Cov-2 infection and should not be used as the sole  basis for treatment or other patient management decisions. A negative result may occur with  improper specimen  collection/handling, submission of specimen other than nasopharyngeal swab, presence of viral mutation(s) within the areas targeted by this assay, and inadequate number of viral copies(<138 copies/mL). A negative result must be combined with clinical observations, patient history, and epidemiological information. The expected result is Negative.  Fact Sheet for Patients:  EntrepreneurPulse.com.au  Fact Sheet for Healthcare Providers:  IncredibleEmployment.be  This test is no t yet approved or cleared by the [place] FDA and  has been authorized for detection and/or diagnosis of SARS-CoV-2 by FDA under an Emergency Use Authorization (EUA). This EUA will remain  in effect (meaning this test can be used) for the duration of the COVID-19 declaration under Section 564(b)(1) of the Act, 21 U.S.C.section 360bbb-3(b)(1), unless the authorization is terminated  or revoked sooner.       Influenza A by PCR NEGATIVE NEGATIVE Final   Influenza B by PCR NEGATIVE NEGATIVE Final    Comment: (NOTE) The Xpert Xpress SARS-CoV-2/FLU/RSV plus assay is intended as an aid in the diagnosis of influenza from Nasopharyngeal swab specimens and should not be used as a sole basis for treatment. Nasal washings and aspirates are unacceptable for Xpert Xpress SARS-CoV-2/FLU/RSV testing.  Fact Sheet for Patients: EntrepreneurPulse.com.au  Fact Sheet for Healthcare Providers: IncredibleEmployment.be  This test is not yet approved or cleared by the [place] FDA and has been authorized for detection and/or diagnosis of SARS-CoV-2 by FDA under an Emergency Use Authorization (EUA). This EUA will remain in effect (meaning this test can be used) for the duration of the COVID-19 declaration under Section 564(b)(1) of the Act,  21 U.S.C. section 360bbb-3(b)(1), unless the authorization is terminated or revoked.  Performed at [facility], [place], [place] [address]   Respiratory (~20 pathogens) panel by PCR     Status: None   Collection Time: 07/16/21  9:40 PM   Specimen: Nasopharyngeal Swab; Respiratory  Result Value Ref Range Status   Adenovirus NOT DETECTED NOT DETECTED Final   Coronavirus 229E NOT DETECTED NOT DETECTED Final    Comment: (NOTE) The Coronavirus on the Respiratory Panel, DOES NOT test for the novel  Coronavirus (2019 nCoV)    Coronavirus HKU1 NOT DETECTED NOT DETECTED Final   Coronavirus NL63 NOT DETECTED NOT DETECTED Final   Coronavirus OC43 NOT DETECTED NOT DETECTED Final   Metapneumovirus NOT DETECTED NOT DETECTED Final   Rhinovirus / Enterovirus NOT DETECTED NOT DETECTED Final   Influenza A NOT DETECTED NOT DETECTED Final   Influenza B NOT DETECTED NOT DETECTED Final   Parainfluenza Virus 1 NOT DETECTED NOT DETECTED Final   Parainfluenza Virus 2 NOT DETECTED NOT DETECTED Final   Parainfluenza Virus 3 NOT DETECTED NOT DETECTED Final   Parainfluenza Virus 4 NOT DETECTED NOT DETECTED Final   Respiratory Syncytial Virus NOT DETECTED NOT DETECTED Final   Bordetella pertussis NOT DETECTED NOT DETECTED Final   Bordetella Parapertussis NOT DETECTED NOT DETECTED Final   Chlamydophila pneumoniae NOT DETECTED NOT DETECTED Final   Mycoplasma pneumoniae NOT DETECTED NOT DETECTED Final    Comment: Performed at [facility], [place]. [address], [place], [place] [address]  MRSA Next Gen by PCR, Nasal     Status: None   Collection Time: 07/18/21  4:52 AM   Specimen: Nasal Mucosa; Nasal Swab  Result Value Ref Range Status   MRSA by PCR Next Gen NOT DETECTED NOT DETECTED Final    Comment: (NOTE) The GeneXpert MRSA Assay (FDA approved for NASAL specimens only), is one component of  a comprehensive MRSA colonization surveillance program. It is not intended to diagnose  MRSA infection nor to guide or monitor treatment for MRSA infections. Test performance is not FDA approved in patients less than 76 years old. Performed at [facility], [address] [place], [place] [address]   Resp Panel by RT-PCR (Flu A&B, Covid) Nasopharyngeal Swab     Status: None   Collection Time: 07/24/21  4:14 PM   Specimen: Nasopharyngeal Swab; Nasopharyngeal(NP) swabs in vial transport medium  Result Value Ref Range Status   SARS Coronavirus 2 by RT PCR NEGATIVE NEGATIVE Final    Comment: (NOTE) SARS-CoV-2 target nucleic acids are NOT DETECTED.  The SARS-CoV-2 RNA is generally detectable in upper respiratory specimens during the acute phase of infection. The lowest concentration of SARS-CoV-2 viral copies this assay can detect is 138 copies/mL. A negative result does not preclude SARS-Cov-2 infection and should not be used as the sole basis for treatment or other patient management decisions. A negative result may occur with  improper specimen collection/handling, submission of specimen other than nasopharyngeal swab, presence of viral mutation(s) within the areas targeted by this assay, and inadequate number of viral copies(<138 copies/mL). A negative result must be combined with clinical observations, patient history, and epidemiological information. The expected result is Negative.  Fact Sheet for Patients:  EntrepreneurPulse.com.au  Fact Sheet for Healthcare Providers:  IncredibleEmployment.be  This test is no t yet approved or cleared by the [place] FDA and  has been authorized for detection and/or diagnosis of SARS-CoV-2 by FDA under an Emergency Use Authorization (EUA). This EUA will remain  in effect (meaning this test can be used) for the duration of the COVID-19 declaration under Section 564(b)(1) of the Act, 21 U.S.C.section 360bbb-3(b)(1), unless the authorization is terminated  or revoked sooner.        Influenza A by PCR NEGATIVE NEGATIVE Final   Influenza B by PCR NEGATIVE NEGATIVE Final    Comment: (NOTE) The Xpert Xpress SARS-CoV-2/FLU/RSV plus assay is intended as an aid in the diagnosis of influenza from Nasopharyngeal swab specimens and should not be used as a sole basis for treatment. Nasal washings and aspirates are unacceptable for Xpert Xpress SARS-CoV-2/FLU/RSV testing.  Fact Sheet for Patients: EntrepreneurPulse.com.au  Fact Sheet for Healthcare Providers: IncredibleEmployment.be  This test is not yet approved or cleared by the [place] FDA and has been authorized for detection and/or diagnosis of SARS-CoV-2 by FDA under an Emergency Use Authorization (EUA). This EUA will remain in effect (meaning this test can be used) for the duration of the COVID-19 declaration under Section 564(b)(1) of the Act, 21 U.S.C. section 360bbb-3(b)(1), unless the authorization is terminated or revoked.  Performed at [facility], [address] [place], [place] [address]      Patient was seen and examined on the day of discharge and was found to be in stable condition. Time coordinating discharge: 35 minutes including assessment and coordination of care, as well as examination of the patient.   SIGNED:  Dessa Phi, DO [facility] 07/25/2021, 1:28 PM

## 2021-07-25 NOTE — Progress Notes (Signed)
BIPAP settings last night at 07/24/21 2200 per CM request.   07/24/21 2200  BiPAP/CPAP/SIPAP  BiPAP/CPAP/SIPAP Pt Type Adult  Mask Type Full face mask  Mask Size Large  Set Rate 24 breaths/min  Respiratory Rate 24 breaths/min  EPAP 8 cmH2O  Oxygen Percent 30 %  Minute Ventilation 10  Leak 0  [facility] Inspiratory Pressure (PIP) 19  Tidal Volume (Vt) 461  BiPAP/CPAP/SIPAP BiPAP (avaps)  Patient Home Equipment No  Auto Titrate No  Press High Alarm 40 cmH2O  Press Low Alarm 2 cmH2O  BiPAP/CPAP /SiPAP Vitals  Pulse Rate 65  Resp 16  BP (!) 102/58  SpO2 92 %  MEWS Score/Color  MEWS Score 0  MEWS Score Color Madison Mcbride

## 2021-07-25 NOTE — Progress Notes (Signed)
[facility]  HPI: Patient is a 66 year old female with a past medical history of HFpEF (LVEF 45-50% 03/2021), OSA on CPAP, hyperlipidemia, type 2 diabetes who presented to [facility] ED 07/15/2021 with complaints of shortness of breath.  Cardiology is consulted for further management of her heart failure and transient atrial fibrillation on 07/25/21.  Interval history: -review of telemetry showed a period of atrial fibrillation from around 7-11am today -patient was awake and sitting in bed resting during that period, denies any symptoms of chest pain, sob, dizziness or palpitations and overall states she feels the same -expected dispo [facility] once insurance approval is obtained    Vitals:   07/25/21 0800 07/25/21 1000 07/25/21 1140 07/25/21 1200  BP: 128/87 121/70 (!) 103/51 121/65  Pulse: (!) 110 (!) 103 65 61  Resp: 16 17 14 17   Temp: 98.6 F (37 C)   98.5 F (36.9 C)  TempSrc: Oral   Oral  SpO2: 93% 96% 96% 97%  Weight:      Height:         Intake/Output Summary (Last 24 hours) at 07/25/2021 1233 Last data filed at 07/25/2021 1000 Gross per 24 hour  Intake 240 ml  Output 2700 ml  Net -2460 ml    PHYSICAL EXAM General: Pleasant caucasian female, well nourished, in no acute distress. Husband at bedside. On oxygen [place] HEENT:  Normocephalic and atraumatic. Neck:   No JVD.  Lungs: Normal respiratory effort on [place]. clear bilaterally to auscultation anteriorly Heart: HRRR . Normal S1 and S2.  Radial pulses 2+ bilaterally. Abdomen: Obese appearing.  Msk: Normal strength and tone for age. Extremities: trace bilateral LEE. Neuro: Alert and oriented X 3. Psych:  Mood appropriate, affect congruent.    LABS: Basic Metabolic Panel: Recent Labs    07/23/21 0610 07/24/21 0440 07/25/21 1055  NA 141 141  --   K 3.6 3.7  --   CL 97* 93*  --   CO2 35* 39*  --   GLUCOSE 80 95  --   BUN 21 21  --   CREATININE 0.98 1.00  --   CALCIUM 8.2* 8.3*  --   MG  --   --  1.8     Liver Function Tests: No results for input(s): AST, ALT, ALKPHOS, BILITOT, PROT, ALBUMIN in the last 72 hours.  No results for input(s): LIPASE, AMYLASE in the last 72 hours. CBC: No results for input(s): WBC, NEUTROABS, HGB, HCT, MCV, PLT in the last 72 hours.  Cardiac Enzymes: No results for input(s): CKTOTAL, CKMB, CKMBINDEX, TROPONINI in the last 72 hours. BNP: Invalid input(s): POCBNP D-Dimer: No results for input(s): DDIMER in the last 72 hours. Hemoglobin A1C: No results for input(s): HGBA1C in the last 72 hours. Fasting Lipid Panel: No results for input(s): CHOL, HDL, LDLCALC, TRIG, CHOLHDL, LDLDIRECT in the last 72 hours. Thyroid Function Tests: No results for input(s): TSH, T4TOTAL, T3FREE, THYROIDAB in the last 72 hours.  Invalid input(s): FREET3 Anemia Panel: No results for input(s): VITAMINB12, FOLATE, FERRITIN, TIBC, IRON, RETICCTPCT in the last 72 hours.  DG Finger Little Right  Result Date: 07/24/2021 CLINICAL DATA:  History of dislocation, finger pain EXAM: RIGHT LITTLE FINGER 2+V COMPARISON:  07/23/2021 FINDINGS: No frank dislocation at the fifth IP joint, there is minimal radial subluxation head of the proximal phalanx with respect to the base of the middle phalanx. Erosive arthropathy at the IP joint. IMPRESSION: Erosive arthropathy at the fifth IP joint. No frank dislocation, there is  minimal radial subluxation of the head of the proximal phalanx with respect to the middle phalanx. Electronically Signed   By: Donavan Foil M.D.   On: 07/24/2021 17:58    Echo LVEF 45 to 50% 03/16/2021  TELEMETRY: Sinus rhythm rate 62  ASSESSMENT AND PLAN:  Principal Problem:   Acute exacerbation of CHF (congestive heart failure) (HCC) Active Problems:   Hypothyroidism   Morbid obesity with BMI of 50.0-59.9, adult (HCC)   Acute respiratory failure with hypoxia and hypercapnia (HCC)   HLD (hyperlipidemia)   Depression   OSA (obstructive sleep apnea)   Acute hypoxemic  respiratory failure (HCC)   Benign essential hypertension   Stage 3a chronic kidney disease ([place])    #new onset atrial fibrillation Pt had a period of afib from around 7-11am today per telemetry review. HR peak was 109 and patient was asymptomatic. She spontaneously converted to NSR around 11:30 today and HR is controlled at 52 during interview - CHADS2VASC = 5, discussed risks and benefits of DOACs for stroke risk reduction. Discussed pt should look out for epistaxis, melena, hematochezia, and significant bleeding from superficial wounds.  -pt agreed to start on 5mg  eliquis twice daily for patient to take for at least 65months, recommend future discussions on an outpatient basis about continuation for longer term  -consider repeat echo on an outpatient basis and reinitiating of coreg once HR allows   #Chronic diastolic congestive heart failure, HFpEF (LVEF 45-50% 03/16/2021) - s/p IV lasix with good UOP daily. Started home torsemide 40mg  PO once daily this morning to be continued at discharge.  - carvedilol 3.125 mg twice daily as a home med.  Holding carvedilol due to bradycardia. Consider adding back coreg once weaned off midodrine and as HR allows.  -no further cardiac diagnostics at this time. Follow up with cardiologist outpatient (Dr. Nehemiah Massed) in 1-2 weeks.  -expected dispo is to a SNF  #Acute hypercarbic and hypoxemic respiratory failure 2/2 OHS #OSA on CPAP -better sustained mentation over the past 24 hours without daytime bipap requirements.  -agree with outpatient bariatric surgery referral and other suggested weight loss strategies.  -encourage participation with PT/OT   #Essential hypertension -BP has been low normal during her hospitalization so far although improving slightly to [id number] systolic over the past 24 hours. She remains off all antihypertensives and has been on low dose midodrine since admission. Consider weaning off midodrine as able, per primary team.   #Elevated  D-dimer -no evidence of pulmonary embolism by VQ scan, no DVT by doppler U/S  #Bilateral lower extremity venous stasis dermatitis -improving, would care as needed   #AKI on CKD, resolved -Creatinine and GFR improved to within normal limits.   #type 2 diabetes As per primary team, consider initiation of SGLT2i on an outpatient basis. Appears pt was on a GLP1 in the past.   #Constipation Continue colace, miralax.   This plan of care was created and discussed with Dr. Corky Sox and he is in agreement.  Tristan Schroeder, [place] 07/25/2021 12:33 PM

## 2021-07-25 NOTE — Progress Notes (Signed)
Pt no longer for discharge today to [facility]. Facility unable to accommodate trilogy at this point. Will discharge on Monday once facility has Bipap. Pt and spouse informed of plan of care.Transferred to 2A, report given to RN.

## 2021-07-25 NOTE — TOC Progression Note (Addendum)
Transition of Care [facility]) - Progression Note    Patient Details  Name: Madison Mcbride MRN: [id number] Date of Birth: Mar 30, 1956  [facility]) CM/SW Contact  Anselm Pancoast, RN Phone Number: 07/25/2021, 11:37 AM  Clinical Narrative:    Insurance Josem Kaufmann received-Auth # [id number]-07/29/21.   Outreached to Tammy @ [facility] to confirm bed availability. Room 707 and ready for today.     Expected Discharge Plan: [place] Barriers to Discharge: Continued Medical Work up, SNF Pending bed offer  Expected Discharge Plan and Services Expected Discharge Plan: [place] In-house Referral: Clinical Social Work   Post Acute Care Choice: [place] Living arrangements for the past 2 months: Single Family Home                                       Social Determinants of Health (SDOH) Interventions    Readmission Risk Interventions No flowsheet data found.

## 2021-07-25 NOTE — TOC Transition Note (Addendum)
Transition of Care [facility]) - CM/SW Discharge Note   Patient Details  Name: Madison Mcbride MRN: [id number] Date of Birth: 1955/11/25  Transition of Care [facility]) CM/SW Contact:  Eileen Stanford, LCSW Phone Number: 07/25/2021, 2:24 PM   Clinical Narrative:    Clinical Social Worker facilitated patient discharge including contacting patient family and facility to confirm patient discharge plans.  Clinical information faxed to facility and family agreeable with plan.  CSW arranged ambulance transport via [facility] to [facility].  RN to call [phone] for report prior to discharge.   3:09 Transport canceled   Final next level of care: [place] Barriers to Discharge: Continued Medical Work up, SNF Pending bed offer   Patient Goals and CMS Choice Patient states their goals for this hospitalization and ongoing recovery are:: Patient wants to go home.      Discharge Placement                       Discharge Plan and Services In-house Referral: Clinical Social Work   Post Acute Care Choice: [place]                               Social Determinants of Health (SDOH) Interventions     Readmission Risk Interventions No flowsheet data found.

## 2021-07-25 NOTE — Progress Notes (Signed)
Patient ID: Madison Mcbride, female   DOB: 06-Sep-1955, 66 y.o.   MRN: [id number]  Subjective: The patient still notes some discomfort in her right little finger, but no new complaints are noted.   Objective: Vital signs in last 24 hours: Temp:  [98.4 F (36.9 C)-98.8 F (37.1 C)] 98.5 F (36.9 C) (01/13 1600) Pulse Rate:  [50-110] 63 (01/13 1600) Resp:  [14-38] 18 (01/13 1600) BP: (101-139)/(51-101) 134/82 (01/13 1600) SpO2:  [89 %-97 %] 93 % (01/13 1600) FiO2 (%):  [30 %] 30 % (01/13 0205) Weight:  [148.5 kg] 148.5 kg (01/13 0500)  Intake/Output from previous day: 01/12 0701 - 01/13 0700 In: 360 [P.O.:360] Out: 2450 [Urine:2450] Intake/Output this shift: Total I/O In: 720 [P.O.:720] Out: 2400 [Urine:2400]  No results for input(s): HGB in the last 72 hours. No results for input(s): WBC, RBC, HCT, PLT in the last 72 hours. Recent Labs    07/23/21 0610 07/24/21 0440  NA 141 141  K 3.6 3.7  CL 97* 93*  CO2 35* 39*  BUN 21 21  CREATININE 0.98 1.00  GLUCOSE 80 95  CALCIUM 8.2* 8.3*   No results for input(s): LABPT, INR in the last 72 hours.  Physical Exam: Orthopedic examination again is limited to the right hand.  The ring and little fingers appear to be well aligned clinically.  They are buddy taped to each other and the AlumaFoam splint appears to be appropriately positioned.  She still notes some discomfort to palpation in the area of the PIP joint.  She is neurovascularly intact to the tips of all digits.  X-rays: Postreduction images of the right little finger demonstrates near anatomic alignment of the PIP joint.  Again, substantial erosive degenerative changes of the PIP joint are noted with subchondral cystic changes and osteophyte formation.  Assessment: Status post closed reduction of right little PIP joint dislocation.  Plan: The patient is to keep the ring and little fingers splinted and buddy taped together at all times.  Otherwise, she may use her  fingers and hands for normal daily activities without restrictions.  Thank you for allow me to participate in the care of this pleasant yet unfortunate woman.  I will sign off at this time.  Please make arrangements for her to follow-up in the office with either myself or Cameron Proud, PA-C, in 1 month.  Please reconsult me if there is need for any further orthopedic input.   Marshall Cork Shoua Ressler 07/25/2021, 4:41 PM

## 2021-07-26 MED ORDER — MIDODRINE HCL 5 MG PO TABS
2.5000 mg | ORAL_TABLET | Freq: Two times a day (BID) | ORAL | Status: DC
  Administered 2021-07-26 – 2021-07-28 (×4): 2.5 mg via ORAL
  Filled 2021-07-26 (×4): qty 1

## 2021-07-26 MED ORDER — GABAPENTIN 100 MG PO CAPS
100.0000 mg | ORAL_CAPSULE | Freq: Three times a day (TID) | ORAL | Status: DC
  Administered 2021-07-26 – 2021-07-29 (×10): 100 mg via ORAL
  Filled 2021-07-26 (×10): qty 1

## 2021-07-26 NOTE — Progress Notes (Signed)
°  PROGRESS NOTE  Patient was supposed to discharge to SNF yesterday, however discharge was canceled as SNF was not able to accommodate for patient's trilogy.  They are in process of ordering BiPAP and plan for discharge to SNF on Monday.  Patient was seen, she has transferred out of stepdown unit.  Doing well without any complaints today.  Wean midodrine    Dessa Phi, DO [facility] 07/26/2021, 10:52 AM  Available via Epic secure chat 7am-7pm After these hours, please refer to coverage provider listed on [email or web address]

## 2021-07-27 MED ORDER — CARVEDILOL 3.125 MG PO TABS
3.1250 mg | ORAL_TABLET | Freq: Two times a day (BID) | ORAL | Status: DC
  Administered 2021-07-27 – 2021-07-29 (×5): 3.125 mg via ORAL
  Filled 2021-07-27 (×5): qty 1

## 2021-07-27 NOTE — TOC Progression Note (Signed)
Transition of Care [facility]) - Progression Note    Patient Details  Name: Madison Mcbride MRN: [id number] Date of Birth: 09-29-1955  Transition of Care [facility]) CM/SW [place], [place] Home Phone Number: 07/27/2021, 2:28 PM  Clinical Narrative:     CSW has sent message to Tammy with [facility] to ensure plan is for patient to come to them tomorrow and to confirm they will have BiPap by tomorrow, pending response.   Expected Discharge Plan: [place] Barriers to Discharge: Continued Medical Work up, SNF Pending bed offer  Expected Discharge Plan and Services Expected Discharge Plan: [place] In-house Referral: Clinical Social Work   Post Acute Care Choice: [place] arrangements for the past 2 months: Single Family Home Expected Discharge Date: 07/25/21                                     Social Determinants of Health (SDOH) Interventions    Readmission Risk Interventions No flowsheet data found.

## 2021-07-27 NOTE — Progress Notes (Signed)
[employer] Return of a fib with rates 110-120. Coreg restarted at lower dose 3.125 mg BID

## 2021-07-27 NOTE — Progress Notes (Signed)
PROGRESS NOTE    Madison Mcbride  XJO:[id number] DOB: 1955/08/15 DOA: 07/15/2021 PCP: Rusty Aus, MD     Brief Narrative:  Madison Mcbride is a 66 year old female with medical history of morbid obesity, OSA on CPAP, CHF, depression, anxiety, hyperlipidemia, neuropathy, diabetes mellitus type 2 presented to the ED with complaints of shortness of breath.  As per patient she forgot to take her fluid medications for past 2 days.  She does not always wear her BiPAP machine at night. In the ED initially SPO2 was 85% on room air improved to 97% on 2 L of oxygen via nasal cannula.  Patient received furosemide 60 mg IV in the ED. ABG showed PCO2 of 76, pH 7.21.  Pulmonology was consulted, patient placed on BiPAP. Cardiology also was consulted for guidance regarding diuresis for CHF.  Patient continued to do well, was requiring BiPAP nightly, diuretic therapy changed to Demadex.  AKI resolved.  Patient had some issues with her right fifth finger and x-ray revealed dislocation which orthopedic surgery reduced during hospitalization.  New events last 24 hours / Subjective: Went back into A Fib RVR 110-120 early this morning. Patient states that she did not feel palpitations, was sitting in bed watching TV during the event. Spontaneously converted back to NSR this morning.   Assessment & Plan:   Principal Problem:   Acute exacerbation of CHF (congestive heart failure) (HCC) Active Problems:   Hypothyroidism   Morbid obesity with BMI of 50.0-59.9, adult (HCC)   Acute respiratory failure with hypoxia and hypercapnia (HCC)   HLD (hyperlipidemia)   Depression   OSA (obstructive sleep apnea)   Acute hypoxemic respiratory failure (HCC)   Benign essential hypertension   Stage 3a chronic kidney disease (HCC)   Acute hypoxemic and hypercarbic respiratory failure -Secondary to CHF exacerbation, OSA/OHS -Continue BiPAP nightly and during naps -Pulmonology signed off 1/9   Acute on chronic  diastolic CHF exacerbation -Appreciate cardiology following, follow-up with outpatient cardiologist Dr. Nehemiah Massed in 1 to 2 weeks -Continue demadex  -Beta-blocker discontinued due to bradycardia, resume low dose coreg today   Paroxysmal A. fib, new onset -Brief episode of A. 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 -Appreciate orthopedic surgery -Reduced 1/12 at bedside  DVT prophylaxis: Lovenox  Place TED hose Start: 07/20/21 1207 Place TED hose Start: 07/15/21 2330 apixaban (ELIQUIS) tablet 5 mg  Code Status: Full Family Communication: No family at bedside Disposition Plan:  Status is: Inpatient  Remains inpatient appropriate because: SNF placement pending     Antimicrobials:  Anti-infectives (From admission, onward)    Start     Dose/Rate Route Frequency Ordered Stop   07/17/21 0900  cefTRIAXone (ROCEPHIN) 2 g in sodium chloride 0.9 % 100 mL IVPB  Status:  Discontinued        2 g 200 mL/hr over 30 Minutes Intravenous Every 24 hours 07/17/21 0850 07/19/21 1013        Objective: Vitals:   07/27/21 4158 07/27/21 3094 07/27/21 0519 07/27/21 0813  BP:  (!) 150/86 131/90 133/71  Pulse:  70 (!) 109 61  Resp:  (!) 21 18 20   Temp:  (!) 97.5 F (36.4 C)  98.3 F (36.8 C)  TempSrc:      SpO2:  94% 97% 91%  Weight: (!) 144.8 kg     Height:        Intake/Output Summary (Last 24 hours) at  07/27/2021 1033 Last data filed at 07/27/2021 1022 Gross per 24 hour  Intake 1090 ml  Output 2800 ml  Net -1710 ml    Filed Weights   07/25/21 0500 07/26/21 0500 07/27/21 0306  Weight: (!) 148.5 kg (!) 148 kg (!) 144.8 kg    Examination:  General exam: Appears calm and comfortable  Respiratory system: Diminished breath sounds, respiratory effort is normal on 3 L nasal cannula O2 Cardiovascular system: S1 & S2 heard, RRR. No murmurs.  Trace pedal edema. Gastrointestinal system: Abdomen is nondistended, soft and nontender. Normal bowel sounds heard. Central nervous system: Alert and oriented. No focal neurological deficits. Speech clear.  Psychiatry: Judgement and insight appear normal. Mood & affect appropriate.   Data Reviewed: I have personally reviewed following labs and imaging studies  CBC: Recent Labs  Lab 07/21/21 0454  WBC 9.4  HGB 10.7*  HCT 38.2  MCV 87.2  PLT 161    Basic Metabolic Panel: Recent Labs  Lab 07/21/21 0454 07/22/21 0426 07/23/21 0610 07/24/21 0440 07/25/21 1055  NA 139 138 141 141  --   K 4.1 3.9 3.6 3.7  --   CL 99 96* 97* 93*  --   CO2 33* 34* 35* 39*  --   GLUCOSE 78 78 80 95  --   BUN 35* 30* 21 21  --   CREATININE 1.29* 1.25* 0.98 1.00  --   CALCIUM 8.4* 8.3* 8.2* 8.3*  --   MG 2.3  --   --   --  1.8  PHOS 3.0  --   --   --   --     GFR: Estimated Creatinine Clearance: 79.4 mL/min (by C-G formula based on SCr of 1 mg/dL). Liver Function Tests: No results for input(s): AST, ALT, ALKPHOS, BILITOT, PROT, ALBUMIN in the last 168 hours.  No results for input(s): LIPASE, AMYLASE in the last 168 hours. No results for input(s): AMMONIA in the last 168 hours. Coagulation Profile: No results for input(s): INR, PROTIME in the last 168 hours. Cardiac Enzymes: No results for input(s): CKTOTAL, CKMB, CKMBINDEX, TROPONINI in the last 168 hours. BNP (last 3 results) No results for input(s): PROBNP in the last 8760 hours. HbA1C: No results  for input(s): HGBA1C in the last 72 hours. CBG: Recent Labs  Lab 07/23/21 1936  GLUCAP 106*    Lipid Profile: No results for input(s): CHOL, HDL, LDLCALC, TRIG, CHOLHDL, LDLDIRECT in the last 72 hours. Thyroid Function Tests: No results for input(s): TSH, T4TOTAL, FREET4, T3FREE, THYROIDAB in the last 72 hours. Anemia Panel: No results for input(s): VITAMINB12, FOLATE, FERRITIN, TIBC, IRON, RETICCTPCT in the last 72 hours. Sepsis Labs: No results for input(s): PROCALCITON, LATICACIDVEN in the last 168 hours.  Recent Results (from the past 240 hour(s))  MRSA Next Gen by PCR, Nasal     Status: None   Collection Time: 07/18/21  4:52 AM   Specimen: Nasal Mucosa; Nasal Swab  Result Value Ref Range Status   MRSA by PCR Next Gen NOT DETECTED NOT DETECTED Final    Comment: (NOTE) The GeneXpert MRSA Assay (  FDA approved for NASAL specimens only), is one component of a comprehensive MRSA colonization surveillance program. It is not intended to diagnose MRSA infection nor to guide or monitor treatment for MRSA infections. Test performance is not FDA approved in patients less than 27 years old. Performed at [facility], [address] [place], [place] [address]   Resp Panel by RT-PCR (Flu A&B, Covid) Nasopharyngeal Swab     Status: None   Collection Time: 07/24/21  4:14 PM   Specimen: Nasopharyngeal Swab; Nasopharyngeal(NP) swabs in vial transport medium  Result Value Ref Range Status   SARS Coronavirus 2 by RT PCR NEGATIVE NEGATIVE Final    Comment: (NOTE) SARS-CoV-2 target nucleic acids are NOT DETECTED.  The SARS-CoV-2 RNA is generally detectable in upper respiratory specimens during the acute phase of infection. The lowest concentration of SARS-CoV-2 viral copies this assay can detect is 138 copies/mL. A negative result does not preclude SARS-Cov-2 infection and should not be used as the sole basis for treatment or other patient management decisions. A negative result  may occur with  improper specimen collection/handling, submission of specimen other than nasopharyngeal swab, presence of viral mutation(s) within the areas targeted by this assay, and inadequate number of viral copies(<138 copies/mL). A negative result must be combined with clinical observations, patient history, and epidemiological information. The expected result is Negative.  Fact Sheet for Patients:  EntrepreneurPulse.com.au  Fact Sheet for Healthcare Providers:  IncredibleEmployment.be  This test is no t yet approved or cleared by the [place] FDA and  has been authorized for detection and/or diagnosis of SARS-CoV-2 by FDA under an Emergency Use Authorization (EUA). This EUA will remain  in effect (meaning this test can be used) for the duration of the COVID-19 declaration under Section 564(b)(1) of the Act, 21 U.S.C.section 360bbb-3(b)(1), unless the authorization is terminated  or revoked sooner.       Influenza A by PCR NEGATIVE NEGATIVE Final   Influenza B by PCR NEGATIVE NEGATIVE Final    Comment: (NOTE) The Xpert Xpress SARS-CoV-2/FLU/RSV plus assay is intended as an aid in the diagnosis of influenza from Nasopharyngeal swab specimens and should not be used as a sole basis for treatment. Nasal washings and aspirates are unacceptable for Xpert Xpress SARS-CoV-2/FLU/RSV testing.  Fact Sheet for Patients: EntrepreneurPulse.com.au  Fact Sheet for Healthcare Providers: IncredibleEmployment.be  This test is not yet approved or cleared by the [place] FDA and has been authorized for detection and/or diagnosis of SARS-CoV-2 by FDA under an Emergency Use Authorization (EUA). This EUA will remain in effect (meaning this test can be used) for the duration of the COVID-19 declaration under Section 564(b)(1) of the Act, 21 U.S.C. section 360bbb-3(b)(1), unless the authorization is terminated  or revoked.  Performed at [facility], [address], [place], [place] [address]        Radiology Studies: No results found.    Scheduled Meds:  apixaban  5 mg Oral BID   atorvastatin  20 mg Oral Q2200   buPROPion  150 mg Oral Daily   carvedilol  3.125 mg Oral BID WC   Chlorhexidine Gluconate Cloth  6 each Topical Daily   docusate sodium  100 mg Oral Daily   fluticasone  1 spray Each Nare Daily   gabapentin  100 mg Oral TID   hydrocerin   Topical Daily   levothyroxine  125 mcg Oral Daily   midodrine  2.5 mg Oral BID WC   montelukast  10 mg Oral  Daily   pantoprazole  40 mg Oral Daily   polyethylene glycol  17 g Oral Daily   rOPINIRole  3 mg Oral TID   sodium chloride flush  3 mL Intravenous Q12H   torsemide  40 mg Oral Daily   Continuous Infusions:  sodium chloride       LOS: 10 days      Dessa Phi, DO [facility] 07/27/2021, 10:33 AM   Available via Epic secure chat 7am-7pm After these hours, please refer to coverage provider listed on [email or web address]

## 2021-07-28 MED ORDER — CARVEDILOL 3.125 MG PO TABS: 3.1250 mg | ORAL_TABLET | Freq: Two times a day (BID) | ORAL | 0 refills | Status: DC

## 2021-07-28 MED ORDER — GABAPENTIN 100 MG PO CAPS: 100.0000 mg | ORAL_CAPSULE | Freq: Three times a day (TID) | ORAL | 0 refills | Status: DC

## 2021-07-28 NOTE — Progress Notes (Signed)
Occupational Therapy Treatment Patient Details Name: Madison Mcbride MRN: [id number] DOB: [birth year] Today's Date: 07/28/2021   History of present illness Madison Mcbride is a 66 y.o. female with medical history significant for morbid obesity, OSA on CPAP, heart failure, depression, anxiety, hyperlipidemia, neuropathy, hypothyroid, non-insulin-dependent diabetes mellitus, who presents emergency department for chief concerns of shortness of breath.   OT comments  Upon entering the room, pt supine in bed and NT attempting to assist pt with hygiene and change linens but needing assistance. Pt rolling L <> R with max A and pt putting in effort by reaching across midline towards bed rails. Pt needing total A fo hygiene and to change bed linens. Pt was on RA and when O2 saturation checked she decreased to 61%. Pt needing to be placed on 5Ls to recover to 90% or higher. RN notified. +2 assistance needed to reposition pt in bed for comfort. Pt is very fatigued at this point to attempt EOB or OOB tasks. All needs within reach. Pt continues to benefit from OT intervention.    Recommendations for follow up therapy are one component of a multi-disciplinary discharge planning process, led by the attending physician.  Recommendations may be updated based on patient status, additional functional criteria and insurance authorization.    Follow Up Recommendations  Skilled nursing-short term rehab (<3 hours/day)    Assistance Recommended at Discharge Frequent or constant Supervision/Assistance  Patient can return home with the following  Two people to help with walking and/or transfers;Two people to help with bathing/dressing/bathroom;Assistance with cooking/housework;Assist for transportation;Help with stairs or ramp for entrance   Equipment Recommendations  Other (comment) (defer to next venue of care)       Precautions / Restrictions Precautions Precautions: Fall       Mobility Bed  Mobility Overal bed mobility: Needs Assistance Bed Mobility: Rolling Rolling: Max assist         General bed mobility comments: multiple rolls L <> R with max A and cuing for hand placement and technique. Pt putting in good effort.    Transfers                   General transfer comment: deferred secondary to fatigue and O2 desaturation         ADL either performed or assessed with clinical judgement   ADL Overall ADL's : Needs assistance/impaired                                       General ADL Comments: Bed linens soiled and pt rolling with max A L <> R and second helper needed for hygiene and to change linens.     Vision Patient Visual Report: No change from baseline            Cognition Arousal/Alertness: Awake/alert Behavior During Therapy: WFL for tasks assessed/performed Overall Cognitive Status: Within Functional Limits for tasks assessed                                 General Comments: Pt is pleasant and cooperative overall.                     Pertinent Vitals/ Pain       Pain Assessment: No/denies pain         Frequency  Min 2X/week  Progress Toward Goals  OT Goals(current goals can now be found in the care plan section)  Progress towards OT goals: Progressing toward goals  Acute Rehab OT Goals Patient Stated Goal: to get stronger and go to rehab OT Goal Formulation: With patient Time For Goal Achievement: 08/02/21 Potential to Achieve Goals: [place] Discharge plan remains appropriate       AM-PAC OT "6 Clicks" Daily Activity     Outcome Measure   Help from another person eating meals?: A Little Help from another person taking care of personal grooming?: A Little Help from another person toileting, which includes using toliet, bedpan, or urinal?: Total Help from another person bathing (including washing, rinsing, drying)?: A Lot Help from another person to put on and taking off  regular upper body clothing?: A Lot Help from another person to put on and taking off regular lower body clothing?: Total 6 Click Score: 12    End of Session Equipment Utilized During Treatment: Oxygen  OT Visit Diagnosis: Unsteadiness on feet (R26.81);Muscle weakness (generalized) (M62.81)   Activity Tolerance Patient limited by fatigue   Patient Left in bed;with call bell/phone within reach;with bed alarm set;with nursing/sitter in room   Nurse Communication Mobility status        Time: 9476-5465 OT Time Calculation (min): 24 min  Charges: OT General Charges $OT Visit: 1 Visit OT Treatments $Self Care/Home Management : 23-37 mins  Darleen Crocker, MS, OTR/L , CBIS ascom [phone]  07/28/21, 12:42 PM

## 2021-07-28 NOTE — Consult Note (Signed)
Patient was counseled on apixaban and received the 30-day free coupon.   Thanks,  Eleonore Chiquito, PharmD, BCPS

## 2021-07-28 NOTE — Discharge Summary (Addendum)
Physician Discharge Summary  Madison Mcbride KDT:[id number] DOB: 1956/01/26 DOA: 07/15/2021  PCP: Rusty Aus, MD  Admit date: 07/15/2021 Discharge date: 07/29/2021  Admitted From: Home Disposition:  SNF  Recommendations for Outpatient Follow-up:  Follow up with PCP in 1 week Follow up with Cardiology Follow-up with Dr. Roland Rack, orthopedic surgery  Discharge Condition: Stable CODE STATUS: Full  Diet recommendation: Heart healthy   Brief/Interim Summary: Madison Mcbride is a 66 year old female with medical history of morbid obesity, OSA on CPAP, CHF, depression, anxiety, hyperlipidemia, neuropathy, diabetes mellitus type 2 presented to the ED with complaints of shortness of breath.  As per patient she forgot to take her fluid medications for past 2 days.  She does not always wear her BiPAP machine at night. In the ED initially SPO2 was 85% on room air improved to 97% on 2 L of oxygen via nasal cannula.  Patient received furosemide 60 mg IV in the ED. ABG showed PCO2 of 76, pH 7.21.  Pulmonology was consulted, patient placed on BiPAP. Cardiology also was consulted for guidance regarding diuresis for CHF.  Patient continued to do well, was requiring BiPAP nightly, diuretic therapy changed to Demadex.  AKI resolved.  Patient had some issues with her right fifth finger and x-ray revealed dislocation which orthopedic surgery reduced during hospitalization. She has had spontaneous and paroxysmal A Fib during hospitalization, was started on low-dose Coreg.   Discharge Diagnoses:  Principal Problem:   Acute exacerbation of CHF (congestive heart failure) (HCC) Active Problems:   Hypothyroidism   Morbid obesity with BMI of 50.0-59.9, adult (HCC)   Acute respiratory failure with hypoxia and hypercapnia (HCC)   HLD (hyperlipidemia)   Depression   OSA (obstructive sleep apnea)   Acute hypoxemic respiratory failure (HCC)   Benign essential hypertension   Stage 3a chronic kidney disease  (HCC)   Acute hypoxemic and hypercarbic respiratory failure -Secondary to CHF exacerbation, OSA/OHS -Continue BiPAP nightly and during naps -Pulmonology signed off 1/9    Acute on chronic diastolic CHF exacerbation -Appreciate cardiology following, follow-up with outpatient cardiologist Dr. Nehemiah Massed in 1 to 2 weeks -Continue demadex  -Beta-blocker discontinued due to bradycardia, resumed low dose coreg 1/15 due to A Fib RVR    Paroxysmal A. fib, new onset -Brief episode of A. 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  Apply thin layer of Eucerin cream (do not apply between toes). Cover open lesions with folded pieces of Xeroform gauze Kellie Simmering # 294), top with ABD pad and secure by wrapping with Kerlix roll gauze.  Float heels off of the bed surface.   Discharge wound  care:   Complete by: As directed    Wound care to the bilateral LEs:  Cleanse legs with soap and water, rinse and pat dry.  Apply thin layer of Eucerin cream (do not apply between toes). Cover open lesions with folded pieces of  Xeroform gauze Kellie Simmering # 294), top with ABD pad and secure by wrapping with Kerlix roll gauze.  Float heels off of the bed surface.   Increase activity slowly   Complete by: As directed       Allergies as of 07/28/2021   No Known Allergies      Medication List     STOP taking these medications    Gabapentin 50 MG Tabs Replaced by: gabapentin 100 MG capsule   montelukast 10 MG tablet Commonly known as: SINGULAIR   potassium chloride SA 20 MEQ tablet Commonly known as: KLOR-CON M   propranolol 20 MG tablet Commonly known as: INDERAL       TAKE these medications    apixaban 5 MG Tabs tablet Commonly known as: ELIQUIS Take 1 tablet (5 mg total) by mouth 2 (two) times daily.   atorvastatin 20 MG tablet Commonly known as: LIPITOR Take 20 mg by mouth daily. What changed: Another medication with the same name was removed. Continue taking this medication, and follow the directions you see here.   Biotin 2.5 MG Caps Take 2.5 mg by mouth daily.   buPROPion 150 MG 24 hr tablet Commonly known as: WELLBUTRIN XL Take 150 mg by mouth daily.   carvedilol 3.125 MG tablet Commonly known as: COREG Take 1 tablet (3.125 mg total) by mouth 2 (two) times daily with a meal. What changed:  medication strength how much to take   Cholecalciferol 25 MCG (1000 UT) tablet Take 1 tablet by mouth daily.   gabapentin 100 MG capsule Commonly known as: NEURONTIN Take 1 capsule (100 mg total) by mouth 3 (three) times daily. Replaces: Gabapentin 50 MG Tabs   levothyroxine 125 MCG tablet Commonly known as: SYNTHROID Take 1 tablet (125 mcg total) by mouth daily. What changed: how much to take   multivitamin tablet Take 1 tablet by mouth daily.   pantoprazole 40 MG tablet Commonly known as: PROTONIX Take 40 mg by mouth daily.   rOPINIRole 3 MG tablet Commonly known as: REQUIP Take 3 mg by mouth 3 (three) times daily.   Semaglutide (1 MG/DOSE) 2 MG/1.5ML Sopn Inject 0.75 mLs  into the skin once a week.   torsemide 20 MG tablet Commonly known as: DEMADEX Take 2 tablets (40 mg total) by mouth daily.   zinc gluconate 50 MG tablet Take 50 mg by mouth daily.               Discharge Care Instructions  (From admission, onward)           Start     Ordered   07/28/21 0000  Discharge wound care:       Comments: Wound care to the bilateral LEs:  Cleanse legs with soap and water, rinse and pat dry.  Apply thin layer of Eucerin cream (do not apply between toes). Cover open lesions with folded pieces of Xeroform gauze Kellie Simmering # 294), top with ABD pad and secure by wrapping with Kerlix roll gauze.  Float heels off of the bed surface.   07/28/21 0951   07/25/21 0000  Discharge wound care:       Comments: Wound care to the bilateral LEs:  Cleanse legs with soap and water,  rinse and pat dry.  Apply thin layer of Eucerin cream (do not apply between toes). Cover open lesions with folded pieces of Xeroform gauze Kellie Simmering # 294), top with ABD pad and secure by wrapping with Kerlix roll gauze.  Float heels off of the bed surface.   07/25/21 1327            Contact information for follow-up providers     Rusty Aus, MD. Schedule an appointment as soon as possible for a visit in 1 week(s).   Specialty: Internal Medicine Contact information: [place] [address] [phone]         Corey Skains, MD. Schedule an appointment as soon as possible for a visit in 1 week(s).   Specialty: Cardiology Contact information: [address] [facility] [place] [address] [phone]         Poggi, Marshall Cork, MD Follow up.   Specialty: Orthopedic Surgery Contact information: [place] [address] [phone]              Contact information for after-discharge care     Destination     [facility]  SNF Preferred SNF .   Service: Skilled Nursing Contact information: [address] [place] [phone]                    No Known Allergies  Procedures/Studies: DG Chest 2 View  Result Date: 07/15/2021 CLINICAL DATA:  Dyspnea EXAM: CHEST - 2 VIEW COMPARISON:  03/14/2021 FINDINGS: Bibasilar opacification likely relates to overlying soft tissue. Lungs are clear. No pneumothorax or pleural effusion. Mild cardiomegaly is stable. Central pulmonary arterial enlargement is again noted in keeping with changes of pulmonary arterial hypertension. IMPRESSION: Stable cardiomegaly. Morphologic changes in keeping with pulmonary arterial hypertension. Electronically Signed   By: Fidela Salisbury M.D.   On: 07/15/2021 22:15   NM Pulmonary Perfusion  Result Date: 07/18/2021 CLINICAL DATA:  Shortness of breath, history CHF EXAM: NUCLEAR MEDICINE PERFUSION LUNG SCAN TECHNIQUE: Perfusion images were obtained in multiple projections after intravenous injection of radiopharmaceutical. Lateral views could not be obtained due to body habitus. Ventilation scans intentionally deferred if perfusion scan and chest x-ray adequate for interpretation during COVID 19 epidemic. RADIOPHARMACEUTICALS:  4.46 mCi Tc-3m MAA IV COMPARISON:  04/15/2020 FINDINGS: Generally nonsegmental diminished perfusion in basilar portions of LEFT lower lobe. No wedge-shaped segmental or subsegmental perfusion defects identified. Chest radiograph demonstrates significant bibasilar pulmonary opacities and a RIGHT pleural effusion. IMPRESSION: Generally diminished perfusion in the basilar portions of the LEFT lower lobe in a nonsegmental pattern; no segmental or subsegmental perfusion defects identified to suggest pulmonary embolism. Electronically Signed   By: Lavonia Dana M.D.   On: 07/18/2021 15:00   US Venous Img Lower Bilateral (DVT)  Result Date: 07/20/2021 CLINICAL DATA:  Lower extremity edema EXAM: BILATERAL  LOWER EXTREMITY VENOUS DOPPLER ULTRASOUND TECHNIQUE: Gray-scale sonography with graded compression, as well as color Doppler and duplex ultrasound were performed to evaluate the lower extremity deep venous systems from the level of the common femoral vein and including the common femoral, femoral, profunda femoral, popliteal and calf veins including the posterior tibial, peroneal and gastrocnemius veins when visible. The superficial great saphenous vein was also interrogated. Spectral Doppler was utilized to evaluate flow at rest and with distal augmentation maneuvers in the common femoral, femoral and popliteal veins. COMPARISON:  None. FINDINGS: RIGHT LOWER  EXTREMITY Common Femoral Vein: No evidence of thrombus. Normal compressibility, respiratory phasicity and response to augmentation. Saphenofemoral Junction: No evidence of thrombus. Normal compressibility and flow on color Doppler imaging. Profunda Femoral Vein: No evidence of thrombus. Normal compressibility and flow on color Doppler imaging. Femoral Vein: No evidence of thrombus. Normal compressibility, respiratory phasicity and response to augmentation. Popliteal Vein: No evidence of thrombus. Normal compressibility, respiratory phasicity and response to augmentation. Calf Veins: Unable to visualize the calf veins secondary to patient body habitus. Superficial Great Saphenous Vein: No evidence of thrombus. Normal compressibility. Venous Reflux:  None. Other Findings:  None. LEFT LOWER EXTREMITY Common Femoral Vein: No evidence of thrombus. Normal compressibility, respiratory phasicity and response to augmentation. Saphenofemoral Junction: No evidence of thrombus. Normal compressibility and flow on color Doppler imaging. Profunda Femoral Vein: No evidence of thrombus. Normal compressibility and flow on color Doppler imaging. Femoral Vein: No evidence of thrombus. Normal compressibility, respiratory phasicity and response to augmentation. Popliteal Vein: No  evidence of thrombus. Normal compressibility, respiratory phasicity and response to augmentation. Calf Veins: Unable to visualize the calf veins secondary to patient body habitus. Superficial Great Saphenous Vein: No evidence of thrombus. Normal compressibility. Venous Reflux:  None. Other Findings:  None. IMPRESSION: Technically challenging and limited examination secondary to patient body habitus. No evidence of deep venous thrombosis to the level of the knees bilaterally. The calf veins are not well seen in either lower extremity. Electronically Signed   By: Jacqulynn Cadet M.D.   On: 07/20/2021 06:31   DG Chest Port 1 View  Result Date: 07/19/2021 CLINICAL DATA:  66 year old female with shortness of breath. EXAM: PORTABLE CHEST 1 VIEW COMPARISON:  Portable chest 07/18/2021 and earlier. FINDINGS: Portable AP upright view at 0417 hours. Stable cardiomegaly and mediastinal contours. Continued somewhat low lung volumes. Diffuse indistinct appearance of the pulmonary vasculature and somewhat symmetric veiling lower lung opacity. No air bronchograms. No pneumothorax. Visualized tracheal air column is within normal limits. No acute osseous abnormality identified. IMPRESSION: Unchanged ventilation from yesterday with suspected pulmonary interstitial edema and small pleural effusions. Underlying cardiomegaly. Electronically Signed   By: Genevie Ann M.D.   On: 07/19/2021 04:56   DG Chest Port 1 View  Result Date: 07/18/2021 CLINICAL DATA:  Dyspnea EXAM: PORTABLE CHEST 1 VIEW COMPARISON:  Chest x-ray 07/15/2021 FINDINGS: Cardiomegaly and mediastinum appear unchanged. Pulmonary vascular congestion with increasing opacities in the lower lung zones since previous study. No large pleural effusion appreciated. No pneumothorax. IMPRESSION: Cardiomegaly with increasing pulmonary vascular congestion and lower lung zone opacities which may represent edema. Electronically Signed   By: Ofilia Neas M.D.   On: 07/18/2021  10:06   DG Hand Complete Right  Result Date: 07/23/2021 CLINICAL DATA:  Arthritis, joint effusion of the hand EXAM: RIGHT HAND - COMPLETE 3+ VIEW COMPARISON:  None. FINDINGS: Marginal erosions especially at the index DIP joint, middle finger PIP joint, and little finger PIP joint. Associated soft tissue swelling. No visible involvement of the carpus or wrist. Dislocated appearance of the fifth PIP joint. IMPRESSION: Erosive arthropathy involving the interphalangeal joints, worse at the little finger PIP joint where there is dislocation. Electronically Signed   By: Jorje Guild M.D.   On: 07/23/2021 09:50   DG Finger Little Right  Result Date: 07/24/2021 CLINICAL DATA:  History of dislocation, finger pain EXAM: RIGHT LITTLE FINGER 2+V COMPARISON:  07/23/2021 FINDINGS: No frank dislocation at the fifth IP joint, there is minimal radial subluxation head of the proximal phalanx with respect  to the base of the middle phalanx. Erosive arthropathy at the IP joint. IMPRESSION: Erosive arthropathy at the fifth IP joint. No frank dislocation, there is minimal radial subluxation of the head of the proximal phalanx with respect to the middle phalanx. Electronically Signed   By: Donavan Foil M.D.   On: 07/24/2021 17:58       Discharge Exam: Vitals:   07/28/21 0000 07/28/21 0528  BP: 122/66 132/78  Pulse: (!) 56 (!) 55  Resp: 20 20  Temp: 97.9 F (36.6 C) 97.8 F (36.6 C)  SpO2: 94% 94%    General: Pt is alert, awake, not in acute distress Cardiovascular: RRR, S1/S2 +, no edema Respiratory: CTA bilaterally, no wheezing, no rhonchi, no respiratory distress, no conversational dyspnea, on room air  Abdominal: Soft, NT, ND, bowel sounds + Extremities: no edema, no cyanosis Psych: Normal mood and affect, stable judgement and insight     The results of significant diagnostics from this hospitalization (including imaging, microbiology, ancillary and laboratory) are listed below for reference.      Microbiology: Recent Results (from the past 240 hour(s))  Resp Panel by RT-PCR (Flu A&B, Covid) Nasopharyngeal Swab     Status: None   Collection Time: 07/24/21  4:14 PM   Specimen: Nasopharyngeal Swab; Nasopharyngeal(NP) swabs in vial transport medium  Result Value Ref Range Status   SARS Coronavirus 2 by RT PCR NEGATIVE NEGATIVE Final    Comment: (NOTE) SARS-CoV-2 target nucleic acids are NOT DETECTED.  The SARS-CoV-2 RNA is generally detectable in upper respiratory specimens during the acute phase of infection. The lowest concentration of SARS-CoV-2 viral copies this assay can detect is 138 copies/mL. A negative result does not preclude SARS-Cov-2 infection and should not be used as the sole basis for treatment or other patient management decisions. A negative result may occur with  improper specimen collection/handling, submission of specimen other than nasopharyngeal swab, presence of viral mutation(s) within the areas targeted by this assay, and inadequate number of viral copies(<138 copies/mL). A negative result must be combined with clinical observations, patient history, and epidemiological information. The expected result is Negative.  Fact Sheet for Patients:  EntrepreneurPulse.com.au  Fact Sheet for Healthcare Providers:  IncredibleEmployment.be  This test is no t yet approved or cleared by the [place] FDA and  has been authorized for detection and/or diagnosis of SARS-CoV-2 by FDA under an Emergency Use Authorization (EUA). This EUA will remain  in effect (meaning this test can be used) for the duration of the COVID-19 declaration under Section 564(b)(1) of the Act, 21 U.S.C.section 360bbb-3(b)(1), unless the authorization is terminated  or revoked sooner.       Influenza A by PCR NEGATIVE NEGATIVE Final   Influenza B by PCR NEGATIVE NEGATIVE Final    Comment: (NOTE) The Xpert Xpress SARS-CoV-2/FLU/RSV plus assay is  intended as an aid in the diagnosis of influenza from Nasopharyngeal swab specimens and should not be used as a sole basis for treatment. Nasal washings and aspirates are unacceptable for Xpert Xpress SARS-CoV-2/FLU/RSV testing.  Fact Sheet for Patients: EntrepreneurPulse.com.au  Fact Sheet for Healthcare Providers: IncredibleEmployment.be  This test is not yet approved or cleared by the [place] FDA and has been authorized for detection and/or diagnosis of SARS-CoV-2 by FDA under an Emergency Use Authorization (EUA). This EUA will remain in effect (meaning this test can be used) for the duration of the COVID-19 declaration under Section 564(b)(1) of the Act, 21 U.S.C. section 360bbb-3(b)(1), unless the authorization is terminated  or revoked.  Performed at [facility], [address] [place], [place] [address]      Labs: BNP (last 3 results) Recent Labs    03/14/21 1817 03/16/21 0819 07/15/21 2147  BNP 1,805.1* 1,352.0* 1,941.7*   Basic Metabolic Panel: Recent Labs  Lab 07/22/21 0426 07/23/21 0610 07/24/21 0440 07/25/21 1055  NA 138 141 141  --   K 3.9 3.6 3.7  --   CL 96* 97* 93*  --   CO2 34* 35* 39*  --   GLUCOSE 78 80 95  --   BUN 30* 21 21  --   CREATININE 1.25* 0.98 1.00  --   CALCIUM 8.3* 8.2* 8.3*  --   MG  --   --   --  1.8   Liver Function Tests: No results for input(s): AST, ALT, ALKPHOS, BILITOT, PROT, ALBUMIN in the last 168 hours. No results for input(s): LIPASE, AMYLASE in the last 168 hours. No results for input(s): AMMONIA in the last 168 hours. CBC: No results for input(s): WBC, NEUTROABS, HGB, HCT, MCV, PLT in the last 168 hours. Cardiac Enzymes: No results for input(s): CKTOTAL, CKMB, CKMBINDEX, TROPONINI in the last 168 hours. BNP: Invalid input(s): POCBNP CBG: Recent Labs  Lab 07/23/21 1936  GLUCAP 106*   D-Dimer No results for input(s): DDIMER in the last 72 hours. Hgb  A1c No results for input(s): HGBA1C in the last 72 hours. Lipid Profile No results for input(s): CHOL, HDL, LDLCALC, TRIG, CHOLHDL, LDLDIRECT in the last 72 hours. Thyroid function studies No results for input(s): TSH, T4TOTAL, T3FREE, THYROIDAB in the last 72 hours.  Invalid input(s): FREET3 Anemia work up No results for input(s): VITAMINB12, FOLATE, FERRITIN, TIBC, IRON, RETICCTPCT in the last 72 hours. Urinalysis    Component Value Date/Time   COLORURINE YELLOW 03/22/2021 1320   APPEARANCEUR CLOUDY (A) 03/22/2021 1320   APPEARANCEUR CLOUDY 01/15/2014 0209   LABSPEC 1.020 03/22/2021 1320   LABSPEC 1.014 01/15/2014 0209   PHURINE 8.5 (H) 03/22/2021 1320   GLUCOSEU 100 (A) 03/22/2021 1320   GLUCOSEU NEGATIVE 01/15/2014 0209   HGBUR MODERATE (A) 03/22/2021 1320   BILIRUBINUR NEGATIVE 03/22/2021 1320   BILIRUBINUR NEGATIVE 01/15/2014 0209   KETONESUR NEGATIVE 03/22/2021 1320   PROTEINUR >300 (A) 03/22/2021 1320   NITRITE NEGATIVE 03/22/2021 1320   LEUKOCYTESUR SMALL (A) 03/22/2021 1320   LEUKOCYTESUR 2+ 01/15/2014 0209   Sepsis Labs Invalid input(s): PROCALCITONIN,  WBC,  LACTICIDVEN Microbiology Recent Results (from the past 240 hour(s))  Resp Panel by RT-PCR (Flu A&B, Covid) Nasopharyngeal Swab     Status: None   Collection Time: 07/24/21  4:14 PM   Specimen: Nasopharyngeal Swab; Nasopharyngeal(NP) swabs in vial transport medium  Result Value Ref Range Status   SARS Coronavirus 2 by RT PCR NEGATIVE NEGATIVE Final    Comment: (NOTE) SARS-CoV-2 target nucleic acids are NOT DETECTED.  The SARS-CoV-2 RNA is generally detectable in upper respiratory specimens during the acute phase of infection. The lowest concentration of SARS-CoV-2 viral copies this assay can detect is 138 copies/mL. A negative result does not preclude SARS-Cov-2 infection and should not be used as the sole basis for treatment or other patient management decisions. A negative result may occur with   improper specimen collection/handling, submission of specimen other than nasopharyngeal swab, presence of viral mutation(s) within the areas targeted by this assay, and inadequate number of viral copies(<138 copies/mL). A negative result must be combined with clinical observations, patient history, and epidemiological information. The expected  result is Negative.  Fact Sheet for Patients:  EntrepreneurPulse.com.au  Fact Sheet for Healthcare Providers:  IncredibleEmployment.be  This test is no t yet approved or cleared by the [place] FDA and  has been authorized for detection and/or diagnosis of SARS-CoV-2 by FDA under an Emergency Use Authorization (EUA). This EUA will remain  in effect (meaning this test can be used) for the duration of the COVID-19 declaration under Section 564(b)(1) of the Act, 21 U.S.C.section 360bbb-3(b)(1), unless the authorization is terminated  or revoked sooner.       Influenza A by PCR NEGATIVE NEGATIVE Final   Influenza B by PCR NEGATIVE NEGATIVE Final    Comment: (NOTE) The Xpert Xpress SARS-CoV-2/FLU/RSV plus assay is intended as an aid in the diagnosis of influenza from Nasopharyngeal swab specimens and should not be used as a sole basis for treatment. Nasal washings and aspirates are unacceptable for Xpert Xpress SARS-CoV-2/FLU/RSV testing.  Fact Sheet for Patients: EntrepreneurPulse.com.au  Fact Sheet for Healthcare Providers: IncredibleEmployment.be  This test is not yet approved or cleared by the [place] FDA and has been authorized for detection and/or diagnosis of SARS-CoV-2 by FDA under an Emergency Use Authorization (EUA). This EUA will remain in effect (meaning this test can be used) for the duration of the COVID-19 declaration under Section 564(b)(1) of the Act, 21 U.S.C. section 360bbb-3(b)(1), unless the authorization is terminated  or revoked.  Performed at [facility], [address] [place], [place] [address]      Patient was seen and examined on the day of discharge and was found to be in stable condition. Time coordinating discharge: 25 minutes including assessment and coordination of care, as well as examination of the patient.   SIGNED:  Dessa Phi, DO [facility] 07/28/2021, 9:51 AM

## 2021-07-28 NOTE — Consult Note (Signed)
Patient was counsel on apixaban and received the 30-day free coupon.   Thanks,  Eleonore Chiquito, PharmD, BCPS

## 2021-07-28 NOTE — TOC Progression Note (Signed)
Transition of Care [facility]) - Progression Note    Patient Details  Name: Madison Mcbride MRN: [id number] Date of Birth: 07-Jun-1956  Transition of Care [facility]) CM/SW [place], [place] Phone Number: 07/28/2021, 3:21 PM  Clinical Narrative:     Per [facility] they did not receive the bipap settings Friday, CSW sent this morning and they are still pending recieivng the bipap however it was ordered this morning.   Per RN, family confused reporting patient can take home "bipap". CSW confirmed with Thedore Mins with [employer] patient actually has a trilogy at home that cannot be accepted by a facility, and therefore will need to have [facility]ap [facility] at [facility].   [facility] has ordered bipap.   Expected Discharge Plan: [place] Barriers to Discharge: Continued Medical Work up, SNF Pending bed offer  Expected Discharge Plan and Services Expected Discharge Plan: [place] In-house Referral: Clinical Social Work   Post Acute Care Choice: [place] Living arrangements for the past 2 months: Single Family Home Expected Discharge Date: 07/28/21                                     Social Determinants of Health (SDOH) Interventions    Readmission Risk Interventions No flowsheet data found.

## 2021-07-28 NOTE — Progress Notes (Signed)
°   07/27/21 2330  BiPAP/CPAP/SIPAP  $ Non-Invasive Ventilator  Non-Invasive Vent Subsequent  BiPAP/CPAP/SIPAP Pt Type Adult  Mask Type Full face mask  Mask Size Large  Set Rate 24 breaths/min  Respiratory Rate 26 breaths/min  IPAP  (Min P-10, Max P-25)  EPAP 8 cmH2O  Oxygen Percent 30 %  Minute Ventilation 7.2  Leak 0  [facility] Inspiratory Pressure (PIP) 16  Tidal Volume (Vt) 450  BiPAP/CPAP/SIPAP BiPAP  Patient Home Equipment No  Auto Titrate No  Press High Alarm 40 cmH2O  Press Low Alarm 2 cmH2O  BiPAP/CPAP /SiPAP Vitals  Resp (!) 23  MEWS Score/Color  MEWS Score 1  MEWS Score Color [place], [place]

## 2021-07-28 NOTE — Progress Notes (Signed)
PT Cancellation Note  Patient Details Name: Madison Mcbride MRN: [id number] DOB: 1956-01-04   Cancelled Treatment:    Reason Eval/Treat Not Completed: Medical issues which prohibited therapy. Patient currently getting washed up, OT in room states patient very sob just rolling. Will re-attempt later as time allows.     Terrill Wauters 07/28/2021, 11:34 AM

## 2021-07-29 ENCOUNTER — Ambulatory Visit: Payer: Medicare Other | Admitting: Family

## 2021-07-29 LAB — CBC
HCT: 39.4 % (ref 36.0–46.0)
Hemoglobin: 11.2 g/dL — ABNORMAL LOW (ref 12.0–15.0)
MCH: 24.5 pg — ABNORMAL LOW (ref 26.0–34.0)
MCHC: 28.4 g/dL — ABNORMAL LOW (ref 30.0–36.0)
MCV: 86.2 fL (ref 80.0–100.0)
Platelets: 289 10*3/uL (ref 150–400)
RBC: 4.57 MIL/uL (ref 3.87–5.11)
RDW: 18.3 % — ABNORMAL HIGH (ref 11.5–15.5)
WBC: 8.2 10*3/uL (ref 4.0–10.5)
nRBC: 0 % (ref 0.0–0.2)

## 2021-07-29 NOTE — TOC Transition Note (Signed)
Transition of Care [facility]) - CM/SW Discharge Note   Patient Details  Name: Madison Mcbride MRN: [id number] Date of Birth: 04-21-1956  Transition of Care [facility]) CM/SW Contact:  Alberteen Sam, LCSW Phone Number: 07/29/2021, 1:46 PM   Clinical Narrative:     [facility] will DC to: [facility] Anticipated DC date: 07/29/21 Transport by: Johnanna Schneiders  Per MD patient ready for DC to . RN, patient, patient's family, and facility notified of DC. Discharge Summary sent to facility. RN given number for report [phone] ask for [place]. DC packet on chart. Ambulance transport requested for patient.  CSW signing off.  Pricilla Riffle, LCSW    Final next level of care: Skilled Nursing Facility Barriers to Discharge: No Barriers Identified   Patient Goals and CMS Choice Patient states their goals for this hospitalization and ongoing recovery are:: to go home CMS Medicare.gov Compare Post Acute Care list provided to:: Patient Choice offered to / list presented to : Patient  Discharge Placement                Patient to be transferred to facility by: [facility]   Patient and family notified of of transfer: 07/29/21  Discharge Plan and Services In-house Referral: Clinical Social Work   Post Acute Care Choice: [place]                               Social Determinants of Health (SDOH) Interventions     Readmission Risk Interventions No flowsheet data found.

## 2021-07-29 NOTE — Progress Notes (Signed)
Report called to Caryl Pina, LPN at the SNF. Pt aware of the transfer, family updated. PIV removed.

## 2021-07-29 NOTE — Progress Notes (Incomplete)
CRITICAL CARE         Date: 07/29/2021,   MRN# [id number] Paislyn Domenico Abdo Jan 05, 1956     AdmissionWeight: (!) 138.3 kg                 CurrentWeight: (!) 146.2 kg  Referring physician: Dr. 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  She also reported associated symptoms of cough which was dry and nonproductive however denied chest pain flulike illness nausea vomiting or diarrhea.  On arrival she was found to be acutely hypoxemic at 65% SPO2 but this did respond to supplemental oxygen and she had ABG with findings of pH 7.21 CO2 >70 with paO2 <50.  She was Covid negative on arrival.  Despite treatment for COPD she continues to exhibit signs of respiratory failure and is with lethargy and somnolence. PCCM consultation for futher evalaution and management.  07/19/21- patient has normal mental status today, continue BIPAP.  Reviewed plan with husband Marylouise Mallet today.   07/20/21- Patient improved, PCCM will sign off and available as needed. Had long discussion with patient regarding bariatric surgery to help reduce risk of death from hypoventilation.    PAST MEDICAL HISTORY   Past Medical History:  Diagnosis Date   CHF (congestive heart failure) ([place])    Diabetes mellitus without complication (HCC)    Hypothyroidism    Lymphedema    RLS (restless legs syndrome)    Sleep apnea    Thyroid cancer ([place]) 2007     SURGICAL HISTORY   Past Surgical History:  Procedure Laterality Date   ABDOMINAL HYSTERECTOMY     COLONOSCOPY  WITH PROPOFOL N/A 08/26/2015   Procedure: COLONOSCOPY WITH PROPOFOL;  Surgeon: Manya Silvas, MD;  Location: [place];  Service: Endoscopy;  Laterality: N/A;   THYROIDECTOMY       FAMILY HISTORY   Family History  Problem Relation Age of Onset   Breast cancer Neg Hx      SOCIAL HISTORY   Social History   Tobacco Use   Smoking status: Never   Smokeless tobacco: Never  Substance Use Topics   Alcohol use: No   Drug use: No     MEDICATIONS    Home Medication:    Current Medication:  Current Facility-Administered Medications:    0.9 %  sodium chloride infusion, 250 mL, Intravenous, PRN, Cox, Amy N, DO   acetaminophen (TYLENOL) tablet 1,000 mg, 1,000 mg, Oral, Q6H PRN, Lattie Corns, PA-C, 1,000 mg at 07/28/21 2134   apixaban (ELIQUIS) tablet 5 mg, 5 mg, Oral, BID, Tang, Alanson Puls, PA-C, 5 mg at 07/28/21 2134   atorvastatin (LIPITOR) tablet 20 mg, 20 mg, Oral, Q2200, [place], Britton L, NP, 20 mg at 07/28/21 1615   buPROPion (WELLBUTRIN XL) 24 hr tablet 150 mg, 150 mg, Oral, Daily, Cox, Amy N, DO, 150 mg at 07/28/21 0857   carvedilol (COREG) tablet 3.125 mg, 3.125 mg, Oral, BID WC, Dessa Phi, DO, 3.125 mg at 07/28/21 1615   Chlorhexidine Gluconate Cloth 2 % PADS 6 each, 6 each, Topical,  Daily, Lang Snow, NP, 6 each at 07/28/21 7616   diclofenac Sodium (VOLTAREN) 1 % topical gel 2 g, 2 g, Topical, PRN, Foust, Katy L, NP, 2 g at 07/28/21 1614   docusate sodium (COLACE) capsule 100 mg, 100 mg, Oral, Daily, Tang, Lily Michelle, PA-C, 100 mg at 07/28/21 0857   fluticasone (FLONASE) 50 MCG/ACT nasal spray 1 spray, 1 spray, Each Nare, Daily, Darrick Meigs, Marge Duncans, MD, 1 spray at 07/28/21 0856   gabapentin (NEURONTIN) capsule 100 mg, 100 mg, Oral, TID, Dessa Phi, DO, 100 mg at 07/28/21 2134   hydrocerin (EUCERIN) cream, , Topical, Daily, Wouk, Ailene Rud, MD, Given at 07/28/21 0858   ibuprofen (ADVIL) tablet 600 mg, 600 mg,  Oral, Q6H PRN, Dessa Phi, DO, 600 mg at 07/28/21 1615   levothyroxine (SYNTHROID) tablet 125 mcg, 125 mcg, Oral, Daily, Cox, Amy N, DO, 125 mcg at 07/29/21 0537   ondansetron (ZOFRAN) injection 4 mg, 4 mg, Intravenous, Q6H PRN, Cox, Amy N, DO, 4 mg at 07/16/21 0414   pantoprazole (PROTONIX) EC tablet 40 mg, 40 mg, Oral, Daily, Cox, Amy N, DO, 40 mg at 07/28/21 0857   polyethylene glycol (MIRALAX / GLYCOLAX) packet 17 g, 17 g, Oral, Daily, Dessa Phi, DO, 17 g at 07/24/21 0943   polyvinyl alcohol (LIQUIFILM TEARS) 1.4 % ophthalmic solution 1 drop, 1 drop, Both Eyes, PRN, Dessa Phi, DO, 1 drop at 07/28/21 0548   rOPINIRole (REQUIP) tablet 3 mg, 3 mg, Oral, TID, Cox, Amy N, DO, 3 mg at 07/28/21 2134   sodium chloride flush (NS) 0.9 % injection 3 mL, 3 mL, Intravenous, Q12H, Cox, Amy N, DO, 3 mL at 07/28/21 2136   sodium chloride flush (NS) 0.9 % injection 3 mL, 3 mL, Intravenous, PRN, Cox, Amy N, DO, 3 mL at 07/17/21 0939   torsemide (DEMADEX) tablet 40 mg, 40 mg, Oral, Daily, Tang, [place], PA-C, 40 mg at 07/28/21 0737    ALLERGIES   Patient has no known allergies.     REVIEW OF SYSTEMS    Review of Systems:  Gen:  Denies  fever, sweats, chills weigh loss  HEENT: Denies blurred vision, double vision, ear pain, eye pain, hearing loss, nose bleeds, sore throat Cardiac:  No dizziness, chest pain or heaviness, chest tightness,edema Resp:   reports cough, shortness of breath,wheezing, deinies hemoptysis,  Gi: Denies swallowing difficulty, stomach pain, nausea or vomiting, diarrhea, constipation, bowel incontinence Gu:  Denies bladder incontinence, burning urine Ext:   Denies Joint pain, stiffness or swelling Skin: Denies  skin rash, easy bruising or bleeding or hives Endoc:  Denies polyuria, polydipsia , polyphagia or weight change Psych:   Denies depression, insomnia or hallucinations   Other:  All other systems negative   VS: BP 131/74 (BP Location: Left  Arm)    Pulse (!) 55    Temp (!) 97.5 F (36.4 C) (Axillary)    Resp 18    Ht 5\' 5"  (1.651 m)    Wt (!) 146.2 kg    SpO2 92%    BMI 53.64 kg/m      PHYSICAL EXAM    GENERAL:Obese somnolent mild distress no fevers, chills,  HEAD: Normocephalic, atraumatic.  EYES: Pupils equal, round, reactive to light. Extraocular muscles intact. No scleral icterus.  MOUTH: dry mucosal membrane. Dentition intact. No abscess noted.  EAR, NOSE, THROAT: Clear without exudates. No external lesions.  NECK: Supple. No thyromegaly. No nodules. No JVD.  PULMONARY: Decreased breath sounds bilaterally without wheezing or  rhonchorous lung sounds CARDIOVASCULAR: S1 and S2. Regular rate and rhythm. No murmurs, rubs, or gallops. No edema. Pedal pulses 2+ bilaterally.  GASTROINTESTINAL: Soft, nontender, nondistended. No masses. Positive bowel sounds. No hepatosplenomegaly.  Obese abdomen MUSCULOSKELETAL: No swelling, clubbing, or +2 edema. Range of motion full in all extremities.  NEUROLOGIC:GCS9  SKIN: No ulceration, lesions, rashes, or cyanosis. Skin warm and dry. Turgor intact.  PSYCHIATRIC: Mood, affect is flat      IMAGING    DG Chest 2 View  Result Date: 07/15/2021 CLINICAL DATA:  Dyspnea EXAM: CHEST - 2 VIEW COMPARISON:  03/14/2021 FINDINGS: Bibasilar opacification likely relates to overlying soft tissue. Lungs are clear. No pneumothorax or pleural effusion. Mild cardiomegaly is stable. Central pulmonary arterial enlargement is again noted in keeping with changes of pulmonary arterial hypertension. IMPRESSION: Stable cardiomegaly. Morphologic changes in keeping with pulmonary arterial hypertension. Electronically Signed   By: Fidela Salisbury M.D.   On: 07/15/2021 22:15   NM Pulmonary Perfusion  Result Date: 07/18/2021 CLINICAL DATA:  Shortness of breath, history CHF EXAM: NUCLEAR MEDICINE PERFUSION LUNG SCAN TECHNIQUE: Perfusion images were obtained in multiple projections after intravenous injection of  radiopharmaceutical. Lateral views could not be obtained due to body habitus. Ventilation scans intentionally deferred if perfusion scan and chest x-ray adequate for interpretation during COVID 19 epidemic. RADIOPHARMACEUTICALS:  4.46 mCi Tc-14m MAA IV COMPARISON:  04/15/2020 FINDINGS: Generally nonsegmental diminished perfusion in basilar portions of LEFT lower lobe. No wedge-shaped segmental or subsegmental perfusion defects identified. Chest radiograph demonstrates significant bibasilar pulmonary opacities and a RIGHT pleural effusion. IMPRESSION: Generally diminished perfusion in the basilar portions of the LEFT lower lobe in a nonsegmental pattern; no segmental or subsegmental perfusion defects identified to suggest pulmonary embolism. Electronically Signed   By: Lavonia Dana M.D.   On: 07/18/2021 15:00   US Venous Img Lower Bilateral (DVT)  Result Date: 07/20/2021 CLINICAL DATA:  Lower extremity edema EXAM: BILATERAL LOWER EXTREMITY VENOUS DOPPLER ULTRASOUND TECHNIQUE: Gray-scale sonography with graded compression, as well as color Doppler and duplex ultrasound were performed to evaluate the lower extremity deep venous systems from the level of the common femoral vein and including the common femoral, femoral, profunda femoral, popliteal and calf veins including the posterior tibial, peroneal and gastrocnemius veins when visible. The superficial great saphenous vein was also interrogated. Spectral Doppler was utilized to evaluate flow at rest and with distal augmentation maneuvers in the common femoral, femoral and popliteal veins. COMPARISON:  None. FINDINGS: RIGHT LOWER EXTREMITY Common Femoral Vein: No evidence of thrombus. Normal compressibility, respiratory phasicity and response to augmentation. Saphenofemoral Junction: No evidence of thrombus. Normal compressibility and flow on color Doppler imaging. Profunda Femoral Vein: No evidence of thrombus. Normal compressibility and flow on color Doppler  imaging. Femoral Vein: No evidence of thrombus. Normal compressibility, respiratory phasicity and response to augmentation. Popliteal Vein: No evidence of thrombus. Normal compressibility, respiratory phasicity and response to augmentation. Calf Veins: Unable to visualize the calf veins secondary to patient body habitus. Superficial Great Saphenous Vein: No evidence of thrombus. Normal compressibility. Venous Reflux:  None. Other Findings:  None. LEFT LOWER EXTREMITY Common Femoral Vein: No evidence of thrombus. Normal compressibility, respiratory phasicity and response to augmentation. Saphenofemoral Junction: No evidence of thrombus. Normal compressibility and flow on color Doppler imaging. Profunda Femoral Vein: No evidence of thrombus. Normal compressibility and flow on color Doppler imaging. Femoral Vein: No evidence of thrombus. Normal compressibility, respiratory phasicity and response to augmentation. Popliteal Vein: No evidence of thrombus. Normal compressibility,  respiratory phasicity and response to augmentation. Calf Veins: Unable to visualize the calf veins secondary to patient body habitus. Superficial Great Saphenous Vein: No evidence of thrombus. Normal compressibility. Venous Reflux:  None. Other Findings:  None. IMPRESSION: Technically challenging and limited examination secondary to patient body habitus. No evidence of deep venous thrombosis to the level of the knees bilaterally. The calf veins are not well seen in either lower extremity. Electronically Signed   By: Jacqulynn Cadet M.D.   On: 07/20/2021 06:31   DG Chest Port 1 View  Result Date: 07/19/2021 CLINICAL DATA:  66 year old female with shortness of breath. EXAM: PORTABLE CHEST 1 VIEW COMPARISON:  Portable chest 07/18/2021 and earlier. FINDINGS: Portable AP upright view at 0417 hours. Stable cardiomegaly and mediastinal contours. Continued somewhat low lung volumes. Diffuse indistinct appearance of the pulmonary vasculature and  somewhat symmetric veiling lower lung opacity. No air bronchograms. No pneumothorax. Visualized tracheal air column is within normal limits. No acute osseous abnormality identified. IMPRESSION: Unchanged ventilation from yesterday with suspected pulmonary interstitial edema and small pleural effusions. Underlying cardiomegaly. Electronically Signed   By: Genevie Ann M.D.   On: 07/19/2021 04:56   DG Chest Port 1 View  Result Date: 07/18/2021 CLINICAL DATA:  Dyspnea EXAM: PORTABLE CHEST 1 VIEW COMPARISON:  Chest x-ray 07/15/2021 FINDINGS: Cardiomegaly and mediastinum appear unchanged. Pulmonary vascular congestion with increasing opacities in the lower lung zones since previous study. No large pleural effusion appreciated. No pneumothorax. IMPRESSION: Cardiomegaly with increasing pulmonary vascular congestion and lower lung zone opacities which may represent edema. Electronically Signed   By: Ofilia Neas M.D.   On: 07/18/2021 10:06   DG Hand Complete Right  Result Date: 07/23/2021 CLINICAL DATA:  Arthritis, joint effusion of the hand EXAM: RIGHT HAND - COMPLETE 3+ VIEW COMPARISON:  None. FINDINGS: Marginal erosions especially at the index DIP joint, middle finger PIP joint, and little finger PIP joint. Associated soft tissue swelling. No visible involvement of the carpus or wrist. Dislocated appearance of the fifth PIP joint. IMPRESSION: Erosive arthropathy involving the interphalangeal joints, worse at the little finger PIP joint where there is dislocation. Electronically Signed   By: Jorje Guild M.D.   On: 07/23/2021 09:50   DG Finger Little Right  Result Date: 07/24/2021 CLINICAL DATA:  History of dislocation, finger pain EXAM: RIGHT LITTLE FINGER 2+V COMPARISON:  07/23/2021 FINDINGS: No frank dislocation at the fifth IP joint, there is minimal radial subluxation head of the proximal phalanx with respect to the base of the middle phalanx. Erosive arthropathy at the IP joint. IMPRESSION: Erosive  arthropathy at the fifth IP joint. No frank dislocation, there is minimal radial subluxation of the head of the proximal phalanx with respect to the middle phalanx. Electronically Signed   By: Donavan Foil M.D.   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 -Likely due to thoracic restriction with obesity hypoventilation syndrome and obstructive sleep apnea overlap -Would like to perform spirometry with graph to evaluate residual lung function and stratification of ventilatory defect -Arterial blood gas to allow patient for BiPAP while here and qualification for home use -Consultation with [facility] for medical equipment and supplies  -Patient would benefit from bariatric evaluation -Complicated by fluid shifts secondary to heart failure and renal impairment -Would benefit from occupational and physical therapy -D-dimer to rule out pulmonary venous thromboembolism-is very high will check DVT study -RD nutritional evaluation for appropriate dietary changes  with morbid obesity , hypothyroid and DM2   Severe hyperkalemia   kayexalate x 1  Pharmacy consult Lasix delivered x 2           -continue diuresis and BIPAP   Acute on chronic diastolic CHF  Continue BIPAP and lasix with albumin    GI/DVT ppx Protonix /heparin   Critical care provider statement:   Total critical care time: 33 minutes   Performed by: Lanney Gins MD   Critical care time was exclusive of separately billable procedures and treating other patients.   Critical care was necessary to treat or prevent imminent or life-threatening deterioration.   Critical care was time spent personally by me on the following activities: development of treatment plan with patient and/or surrogate as well as nursing, discussions with consultants, evaluation of patient's response to treatment, examination of patient, obtaining history from patient or surrogate, ordering  and performing treatments and interventions, ordering and review of laboratory studies, ordering and review of radiographic studies, pulse oximetry and re-evaluation of patient's condition.         Ottie Glazier, M.D.  Division of [place]

## 2021-07-29 NOTE — Progress Notes (Signed)
°  PROGRESS NOTE  Patient medically stable for discharge to SNF.  Discharge delayed yesterday as SNF place did not have BiPAP machine yet.  Discharge summary is updated with today's discharge date.   Dessa Phi, DO [facility] 07/29/2021, 10:35 AM  Available via Epic secure chat 7am-7pm After these hours, please refer to coverage provider listed on [email or web address]

## 2021-07-29 NOTE — Progress Notes (Signed)
Occupational Therapy Treatment Patient Details Name: Madison Mcbride MRN: [id number] DOB: 26-May-1956 Today's Date: 07/29/2021   History of present illness Madison Mcbride is a 66 y.o. female with medical history significant for morbid obesity, OSA on CPAP, heart failure, depression, anxiety, hyperlipidemia, neuropathy, hypothyroid, non-insulin-dependent diabetes mellitus, who presents emergency department for chief concerns of shortness of breath.   OT comments  Pt preparing for discharge upon OT arrival, but pt and SN requested assist with dressing/brief change.  OT provided set up for peri care d/t removal of pure wick and transition to diaper.  OT assisted with rolling R/L to don diaper, then again to don panties over diaper to keep diaper in place.  Pt required frequent rest breaks after each roll, and cues for PLB.  Adjustments prn to [facility] during rolling and rest periods.  Trendelenburg used to ease scoot toward HOB, max vc for technique, pt tolerated pulling from L bed rail only with LUE d/t R shoulder immobility.  Pt requested another purewick while waiting for EMS, which OT placed within diaper.  Reminded pt to alert nurse/transport staff to disconnect when transport arrived.  Pt agreed.  Pt prepping for transport to [facility].  OT left pt with needs met and call light within reach.    Recommendations for follow up therapy are one component of a multi-disciplinary discharge planning process, led by the attending physician.  Recommendations may be updated based on patient status, additional functional criteria and insurance authorization.    Follow Up Recommendations  Skilled nursing-short term rehab (<3 hours/day)    Assistance Recommended at Discharge Frequent or constant Supervision/Assistance  Patient can return home with the following  Two people to help with walking and/or transfers;Two people to help with bathing/dressing/bathroom;Assistance with cooking/housework;Assist for  transportation;Help with stairs or ramp for entrance   Equipment Recommendations  Other (comment)          Precautions / Restrictions Precautions Precautions: Fall Restrictions Weight Bearing Restrictions: No Other Position/Activity Restrictions: Frequent rest breaks needed d/t dyspnea       Mobility Bed Mobility Overal bed mobility: Needs Assistance Bed Mobility: Rolling Rolling: Mod assist         General bed mobility comments: mod A to roll R/L, min A to scoot to [facility] with use of trendelenburg and cues for pulling on L bed rail with L arm, unable to reach with R arm d/t R shoulder with decreased mobility at baseline. Patient Response: Cooperative                           Balance       Sitting balance - Comments: not assessed this session       Standing balance comment: not assessed this session                           ADL either performed or assessed with clinical judgement   ADL Overall ADL's : Needs assistance/impaired                 Upper Body Dressing : Moderate assistance Upper Body Dressing Details (indicate cue type and reason): bed level; assisted with rolling to pull shirt down from behind Lower Body Dressing: Total assistance;Bed level Lower Body Dressing Details (indicate cue type and reason): donned new brief and underwear over brief at bed level; pt assisted with rolling R/L for OT to hike     Toileting- Clothing  Manipulation and Hygiene: Moderate assistance Toileting - Clothing Manipulation Details (indicate cue type and reason): pt able to wash groin area in supine with HOB adjustments, mod A for thoroughness (limited by obesity)            Extremity/Trunk Assessment Upper Extremity Assessment RUE Deficits / Details: RUE shoulder flexion to approx 90 degrees, full AROM in elbow and wrist; Pt with impaired FMC/dexterity throughout R hand with weak grip strength; unable to reach overhead on R side to pull from  bedrail when scooting toward [facility]       Cervical / Trunk Assessment Cervical / Trunk Assessment: Normal    Vision Patient Visual Report: No change from baseline                Cognition Arousal/Alertness: Awake/alert Behavior During Therapy: WFL for tasks assessed/performed Overall Cognitive Status: Within Functional Limits for tasks assessed Area of Impairment: Following commands, Safety/judgement, Awareness, Problem solving                       Following Commands: Follows multi-step commands consistently   Awareness: Intellectual   General Comments: Pt is pleasant and cooperative overall.                     General Comments pt has drying garment under abdominal skin fold    Pertinent Vitals/ Pain       Pain Assessment Pain Assessment: No/denies pain                                                          Frequency  Min 2X/week        Progress Toward Goals  OT Goals(current goals can now be found in the care plan section)  Progress towards OT goals: Progressing toward goals  Acute Rehab OT Goals Patient Stated Goal: to get stronger and go to rehab OT Goal Formulation: With patient Time For Goal Achievement: 08/02/21 Potential to Achieve Goals: Good  Plan Discharge plan remains appropriate    Co-evaluation          OT goals addressed during session: ADL's and self-care      AM-PAC OT "6 Clicks" Daily Activity     Outcome Measure   Help from another person eating meals?: None Help from another person taking care of personal grooming?: A Little Help from another person toileting, which includes using toliet, bedpan, or urinal?: Total Help from another person bathing (including washing, rinsing, drying)?: A Lot Help from another person to put on and taking off regular upper body clothing?: A Lot Help from another person to put on and taking off regular lower body clothing?: Total 6 Click Score: 13     End of Session Equipment Utilized During Treatment: Oxygen  OT Visit Diagnosis: Unsteadiness on feet (R26.81);Muscle weakness (generalized) (M62.81)   Activity Tolerance Patient limited by fatigue   Patient Left in bed;with call bell/phone within reach   Nurse Communication Mobility status        Time: 5883-2549 OT Time Calculation (min): 22 min  Charges: OT General Charges $OT Visit: 1 Visit OT Treatments $Self Care/Home Management : 8-22 mins  Leta Speller, MS, OTR/L   Darleene Cleaver 07/29/2021, 5:52 PM

## 2021-07-29 NOTE — Care Management Important Message (Signed)
Important Message  Patient Details  Name: Madison Mcbride MRN: [id number] Date of Birth: 05-08-1956   Medicare Important Message Given:  Yes     Dannette Barbara 07/29/2021, 11:37 AM

## 2021-08-07 ENCOUNTER — Ambulatory Visit: Payer: Medicare Other | Admitting: Family

## 2021-08-20 ENCOUNTER — Ambulatory Visit: Payer: Medicare Other | Admitting: Family

## 2021-09-02 ENCOUNTER — Ambulatory Visit: Payer: Medicare Other | Admitting: Family

## 2022-03-05 ENCOUNTER — Emergency Department: Payer: Medicare Other

## 2022-03-05 ENCOUNTER — Inpatient Hospital Stay
Admission: EM | Admit: 2022-03-05 | Discharge: 2022-03-19 | DRG: 871 | Disposition: A | Discharge status: Home Health | Payer: Medicare Other | Attending: Osteopathic Medicine | Admitting: Osteopathic Medicine

## 2022-03-05 ENCOUNTER — Other Ambulatory Visit: Payer: Self-pay

## 2022-03-05 DIAGNOSIS — E1122 Type 2 diabetes mellitus with diabetic chronic kidney disease: Secondary | ICD-10-CM | POA: Diagnosis present

## 2022-03-05 DIAGNOSIS — E1140 Type 2 diabetes mellitus with diabetic neuropathy, unspecified: Secondary | ICD-10-CM | POA: Diagnosis present

## 2022-03-05 DIAGNOSIS — I5023 Acute on chronic systolic (congestive) heart failure: Secondary | ICD-10-CM | POA: Diagnosis present

## 2022-03-05 DIAGNOSIS — A419 Sepsis, unspecified organism: Principal | ICD-10-CM | POA: Diagnosis present

## 2022-03-05 DIAGNOSIS — R570 Cardiogenic shock: Secondary | ICD-10-CM | POA: Diagnosis present

## 2022-03-05 DIAGNOSIS — J449 Chronic obstructive pulmonary disease, unspecified: Secondary | ICD-10-CM | POA: Diagnosis present

## 2022-03-05 DIAGNOSIS — I480 Paroxysmal atrial fibrillation: Secondary | ICD-10-CM | POA: Diagnosis present

## 2022-03-05 DIAGNOSIS — Z85850 Personal history of malignant neoplasm of thyroid: Secondary | ICD-10-CM

## 2022-03-05 DIAGNOSIS — N1831 Chronic kidney disease, stage 3a: Secondary | ICD-10-CM | POA: Diagnosis present

## 2022-03-05 DIAGNOSIS — G4733 Obstructive sleep apnea (adult) (pediatric): Secondary | ICD-10-CM | POA: Diagnosis present

## 2022-03-05 DIAGNOSIS — Z7951 Long term (current) use of inhaled steroids: Secondary | ICD-10-CM

## 2022-03-05 DIAGNOSIS — J9621 Acute and chronic respiratory failure with hypoxia: Secondary | ICD-10-CM | POA: Diagnosis present

## 2022-03-05 DIAGNOSIS — I5022 Chronic systolic (congestive) heart failure: Secondary | ICD-10-CM | POA: Diagnosis present

## 2022-03-05 DIAGNOSIS — J9622 Acute and chronic respiratory failure with hypercapnia: Secondary | ICD-10-CM | POA: Diagnosis present

## 2022-03-05 DIAGNOSIS — I248 Other forms of acute ischemic heart disease: Secondary | ICD-10-CM | POA: Diagnosis present

## 2022-03-05 DIAGNOSIS — E1151 Type 2 diabetes mellitus with diabetic peripheral angiopathy without gangrene: Secondary | ICD-10-CM | POA: Diagnosis present

## 2022-03-05 DIAGNOSIS — I10 Essential (primary) hypertension: Secondary | ICD-10-CM | POA: Diagnosis present

## 2022-03-05 DIAGNOSIS — R6520 Severe sepsis without septic shock: Secondary | ICD-10-CM | POA: Diagnosis not present

## 2022-03-05 DIAGNOSIS — I130 Hypertensive heart and chronic kidney disease with heart failure and stage 1 through stage 4 chronic kidney disease, or unspecified chronic kidney disease: Secondary | ICD-10-CM | POA: Diagnosis present

## 2022-03-05 DIAGNOSIS — N179 Acute kidney failure, unspecified: Secondary | ICD-10-CM | POA: Diagnosis present

## 2022-03-05 DIAGNOSIS — Z79899 Other long term (current) drug therapy: Secondary | ICD-10-CM

## 2022-03-05 DIAGNOSIS — E876 Hypokalemia: Secondary | ICD-10-CM | POA: Clinically undetermined

## 2022-03-05 DIAGNOSIS — I872 Venous insufficiency (chronic) (peripheral): Secondary | ICD-10-CM | POA: Diagnosis present

## 2022-03-05 DIAGNOSIS — Z20822 Contact with and (suspected) exposure to covid-19: Secondary | ICD-10-CM | POA: Diagnosis present

## 2022-03-05 DIAGNOSIS — R778 Other specified abnormalities of plasma proteins: Secondary | ICD-10-CM | POA: Diagnosis present

## 2022-03-05 DIAGNOSIS — R6521 Severe sepsis with septic shock: Secondary | ICD-10-CM | POA: Diagnosis present

## 2022-03-05 DIAGNOSIS — E039 Hypothyroidism, unspecified: Secondary | ICD-10-CM | POA: Diagnosis present

## 2022-03-05 DIAGNOSIS — Z7901 Long term (current) use of anticoagulants: Secondary | ICD-10-CM

## 2022-03-05 DIAGNOSIS — Z6841 Body Mass Index (BMI) 40.0 and over, adult: Secondary | ICD-10-CM | POA: Diagnosis not present

## 2022-03-05 DIAGNOSIS — R579 Shock, unspecified: Secondary | ICD-10-CM | POA: Diagnosis present

## 2022-03-05 DIAGNOSIS — M549 Dorsalgia, unspecified: Secondary | ICD-10-CM | POA: Diagnosis present

## 2022-03-05 DIAGNOSIS — I890 Lymphedema, not elsewhere classified: Secondary | ICD-10-CM

## 2022-03-05 DIAGNOSIS — L03116 Cellulitis of left lower limb: Secondary | ICD-10-CM | POA: Diagnosis present

## 2022-03-05 DIAGNOSIS — G2581 Restless legs syndrome: Secondary | ICD-10-CM | POA: Diagnosis present

## 2022-03-05 DIAGNOSIS — G9341 Metabolic encephalopathy: Secondary | ICD-10-CM | POA: Diagnosis present

## 2022-03-05 DIAGNOSIS — Z79890 Hormone replacement therapy (postmenopausal): Secondary | ICD-10-CM

## 2022-03-05 DIAGNOSIS — L03115 Cellulitis of right lower limb: Secondary | ICD-10-CM | POA: Diagnosis present

## 2022-03-05 DIAGNOSIS — R7989 Other specified abnormal findings of blood chemistry: Secondary | ICD-10-CM | POA: Diagnosis present

## 2022-03-05 DIAGNOSIS — R000 Tachycardia, unspecified: Secondary | ICD-10-CM | POA: Diagnosis present

## 2022-03-05 DIAGNOSIS — E785 Hyperlipidemia, unspecified: Secondary | ICD-10-CM | POA: Diagnosis present

## 2022-03-05 LAB — COMPREHENSIVE METABOLIC PANEL
ALT: 14 U/L (ref 0–44)
AST: 25 U/L (ref 15–41)
Albumin: 3.1 g/dL — ABNORMAL LOW (ref 3.5–5.0)
Alkaline Phosphatase: 92 U/L (ref 38–126)
Anion gap: 11 (ref 5–15)
BUN: 63 mg/dL — ABNORMAL HIGH (ref 8–23)
CO2: 28 mmol/L (ref 22–32)
Calcium: 8.1 mg/dL — ABNORMAL LOW (ref 8.9–10.3)
Chloride: 100 mmol/L (ref 98–111)
Creatinine, Ser: 1.97 mg/dL — ABNORMAL HIGH (ref 0.44–1.00)
GFR, Estimated: 27 mL/min — ABNORMAL LOW (ref >60)
Glucose, Bld: 131 mg/dL — ABNORMAL HIGH (ref 70–99)
Potassium: 4.9 mmol/L (ref 3.5–5.1)
Sodium: 139 mmol/L (ref 135–145)
Total Bilirubin: 1.5 mg/dL — ABNORMAL HIGH (ref 0.3–1.2)
Total Protein: 7.1 g/dL (ref 6.5–8.1)

## 2022-03-05 LAB — CBC WITH DIFFERENTIAL/PLATELET
Abs Immature Granulocytes: 0.08 10*3/uL — ABNORMAL HIGH (ref 0.00–0.07)
Basophils Absolute: 0.1 10*3/uL (ref 0.0–0.1)
Basophils Relative: 0 %
Eosinophils Absolute: 0.2 10*3/uL (ref 0.0–0.5)
Eosinophils Relative: 1 %
HCT: 36.4 % (ref 36.0–46.0)
Hemoglobin: 9.9 g/dL — ABNORMAL LOW (ref 12.0–15.0)
Immature Granulocytes: 1 %
Lymphocytes Relative: 9 %
Lymphs Abs: 1.3 10*3/uL (ref 0.7–4.0)
MCH: 24 pg — ABNORMAL LOW (ref 26.0–34.0)
MCHC: 27.2 g/dL — ABNORMAL LOW (ref 30.0–36.0)
MCV: 88.3 fL (ref 80.0–100.0)
Monocytes Absolute: 1.2 10*3/uL — ABNORMAL HIGH (ref 0.1–1.0)
Monocytes Relative: 8 %
Neutro Abs: 11.7 10*3/uL — ABNORMAL HIGH (ref 1.7–7.7)
Neutrophils Relative %: 81 %
Platelets: 343 10*3/uL (ref 150–400)
RBC: 4.12 MIL/uL (ref 3.87–5.11)
RDW: 19.6 % — ABNORMAL HIGH (ref 11.5–15.5)
WBC: 14.5 10*3/uL — ABNORMAL HIGH (ref 4.0–10.5)
nRBC: 0.7 % — ABNORMAL HIGH (ref 0.0–0.2)

## 2022-03-05 LAB — BLOOD GAS, VENOUS
Acid-Base Excess: 4.8 mmol/L — ABNORMAL HIGH (ref 0.0–2.0)
Bicarbonate: 33.2 mmol/L — ABNORMAL HIGH (ref 20.0–28.0)
O2 Saturation: 85.9 %
Patient temperature: 37
pCO2, Ven: 66 mmHg — ABNORMAL HIGH (ref 44–60)
pH, Ven: 7.31 (ref 7.25–7.43)
pO2, Ven: 67 mmHg — ABNORMAL HIGH (ref 32–45)

## 2022-03-05 LAB — BRAIN NATRIURETIC PEPTIDE: B Natriuretic Peptide: 955 pg/mL — ABNORMAL HIGH (ref 0.0–100.0)

## 2022-03-05 LAB — LACTIC ACID, PLASMA: Lactic Acid, Venous: 1 mmol/L (ref 0.5–1.9)

## 2022-03-05 LAB — TROPONIN I (HIGH SENSITIVITY): Troponin I (High Sensitivity): 16 ng/L (ref <18)

## 2022-03-05 MED ORDER — FUROSEMIDE 10 MG/ML IJ SOLN
20.0000 mg | Freq: Two times a day (BID) | INTRAMUSCULAR | Status: DC
  Administered 2022-03-06 – 2022-03-10 (×9): 20 mg via INTRAVENOUS
  Filled 2022-03-05 (×9): qty 2

## 2022-03-05 MED ORDER — VANCOMYCIN HCL IN DEXTROSE 1-5 GM/200ML-% IV SOLN
1000.0000 mg | Freq: Once | INTRAVENOUS | Status: AC
  Administered 2022-03-05: 1000 mg via INTRAVENOUS
  Filled 2022-03-05: qty 200

## 2022-03-05 MED ORDER — DOCUSATE SODIUM 100 MG PO CAPS
100.0000 mg | ORAL_CAPSULE | Freq: Two times a day (BID) | ORAL | Status: DC | PRN
  Administered 2022-03-11 – 2022-03-17 (×4): 100 mg via ORAL
  Filled 2022-03-05 (×4): qty 1

## 2022-03-05 MED ORDER — POLYETHYLENE GLYCOL 3350 17 G PO PACK: 17.0000 g | PACK | Freq: Every day | ORAL | Status: DC | PRN

## 2022-03-05 MED ORDER — SODIUM CHLORIDE 0.9 % IV BOLUS
500.0000 mL | Freq: Once | INTRAVENOUS | Status: AC
  Administered 2022-03-05: 500 mL via INTRAVENOUS

## 2022-03-05 MED ORDER — IPRATROPIUM-ALBUTEROL 0.5-2.5 (3) MG/3ML IN SOLN: 3.0000 mL | Freq: Four times a day (QID) | RESPIRATORY_TRACT | Status: DC | PRN

## 2022-03-05 MED ORDER — HEPARIN SODIUM (PORCINE) 5000 UNIT/ML IJ SOLN
5000.0000 [IU] | Freq: Three times a day (TID) | INTRAMUSCULAR | Status: DC
  Administered 2022-03-06: 5000 [IU] via SUBCUTANEOUS
  Filled 2022-03-05: qty 1

## 2022-03-05 MED ORDER — BUDESONIDE 0.25 MG/2ML IN SUSP
0.2500 mg | Freq: Two times a day (BID) | RESPIRATORY_TRACT | Status: DC
Start: 2022-03-05 — End: 2022-03-11
  Administered 2022-03-05 – 2022-03-11 (×12): 0.25 mg via RESPIRATORY_TRACT
  Filled 2022-03-05 (×12): qty 2

## 2022-03-05 MED ORDER — CEFTRIAXONE SODIUM 2 G IJ SOLR
2.0000 g | Freq: Once | INTRAMUSCULAR | Status: AC
  Administered 2022-03-05: 2 g via INTRAVENOUS
  Filled 2022-03-05: qty 20

## 2022-03-05 MED ORDER — NOREPINEPHRINE 4 MG/250ML-% IV SOLN
0.0000 ug/min | INTRAVENOUS | Status: DC
  Administered 2022-03-05: 2 ug/min via INTRAVENOUS
  Administered 2022-03-06: 4 ug/min via INTRAVENOUS
  Filled 2022-03-05 (×2): qty 250

## 2022-03-05 MED ORDER — IPRATROPIUM-ALBUTEROL 0.5-2.5 (3) MG/3ML IN SOLN
3.0000 mL | Freq: Four times a day (QID) | RESPIRATORY_TRACT | Status: DC
  Administered 2022-03-06 – 2022-03-11 (×22): 3 mL via RESPIRATORY_TRACT
  Filled 2022-03-05 (×22): qty 3

## 2022-03-05 MED ORDER — LACTATED RINGERS IV SOLN: INTRAVENOUS | Status: DC

## 2022-03-05 MED ORDER — INSULIN ASPART 100 UNIT/ML IJ SOLN
0.0000 [IU] | INTRAMUSCULAR | Status: DC
  Administered 2022-03-06 – 2022-03-08 (×3): 1 [IU] via SUBCUTANEOUS
  Administered 2022-03-08: 5 [IU] via SUBCUTANEOUS
  Administered 2022-03-09 – 2022-03-12 (×8): 1 [IU] via SUBCUTANEOUS
  Filled 2022-03-05 (×12): qty 1

## 2022-03-05 NOTE — ED Provider Notes (Signed)
[facility] Provider Note    Event Date/Time   First MD Initiated Contact with Patient 03/05/22 2049     (approximate)   History   Shortness of Breath   HPI  Madison Mcbride is a 66 y.o. female who presents to the ER for evaluation of shortness of breath.  Patient has a history of CHF as well as COPD.  Having worsening lower extremity swelling as well is redness in the left leg.  Feeling weak.  Denies any nausea or vomiting.  [place] supposed to be on home oxygen but is not always needed that she does wear CPAP at night.  Over the past 2 to 3 days she been having worsening hypoxia.  She is on anticoagulation has been compliant with her medications including Lasix.      Physical Exam   Triage Vital Signs: ED Triage Vitals  Enc Vitals Group     BP 03/05/22 1737 (!) 109/55     Pulse Rate 03/05/22 1737 (!) 55     Resp 03/05/22 1737 12     Temp 03/05/22 1737 98.1 F (36.7 C)     Temp Source 03/05/22 2117 Oral     SpO2 03/05/22 1737 98 %     Weight 03/05/22 1737 (!) 316 lb (143.3 kg)     Height --      Head Circumference --      [place] Flow --      Pain Score 03/05/22 1737 0     Pain Loc --      Pain Edu? --      Excl. in [place]? --     Most recent vital signs: Vitals:   03/05/22 2230 03/05/22 2300  BP: (!) 86/53 (!) 88/54  Pulse: (!) 58 (!) 54  Resp: 18 17  Temp:    SpO2: 100% 98%     Constitutional: Alert  Eyes: Conjunctivae are normal.  Head: Atraumatic. Nose: No congestion/rhinnorhea. Mouth/Throat: Mucous membranes are moist.   Neck: Painless ROM.  Cardiovascular:   Good peripheral circulation. Strong radial and pt pulses.  No m/g/r Respiratory: Normal respiratory effort.  No retractions.  Gastrointestinal: Soft and nontender.  Musculoskeletal:  no deformity Neurologic:  MAE spontaneously. No gross focal neurologic deficits are appreciated.  Skin:  Skin is warm, dry and intact. No rash noted. Psychiatric: Mood and affect are  normal. Speech and behavior are normal.    ED Results / Procedures / Treatments   Labs (all labs ordered are listed, but only abnormal results are displayed) Labs Reviewed  COMPREHENSIVE METABOLIC PANEL - Abnormal; Notable for the following components:      Result Value   Glucose, Bld 131 (*)    BUN 63 (*)    Creatinine, Ser 1.97 (*)    Calcium 8.1 (*)    Albumin 3.1 (*)    Total Bilirubin 1.5 (*)    GFR, Estimated 27 (*)    All other components within normal limits  CBC WITH DIFFERENTIAL/PLATELET - Abnormal; Notable for the following components:   WBC 14.5 (*)    Hemoglobin 9.9 (*)    MCH 24.0 (*)    MCHC 27.2 (*)    RDW 19.6 (*)    nRBC 0.7 (*)    Neutro Abs 11.7 (*)    Monocytes Absolute 1.2 (*)    Abs Immature Granulocytes 0.08 (*)    All other components within normal limits  BRAIN NATRIURETIC PEPTIDE - Abnormal; Notable for the following components:  B Natriuretic Peptide 955.0 (*)    All other components within normal limits  BLOOD GAS, VENOUS - Abnormal; Notable for the following components:   pCO2, Ven 66 (*)    pO2, Ven 67 (*)    Bicarbonate 33.2 (*)    Acid-Base Excess 4.8 (*)    All other components within normal limits  CULTURE, BLOOD (ROUTINE X 2)  CULTURE, BLOOD (ROUTINE X 2)  LACTIC ACID, PLASMA  LACTIC ACID, PLASMA  HEMOGLOBIN A1C  CREATININE, SERUM  PROCALCITONIN  LEGIONELLA PNEUMOPHILA SEROGP 1 UR AG  STREP PNEUMONIAE URINARY ANTIGEN  CBC  BASIC METABOLIC PANEL  PHOSPHORUS  MAGNESIUM  TROPONIN I (HIGH SENSITIVITY)  TROPONIN I (HIGH SENSITIVITY)     EKG  ED ECG REPORT I, Merlyn Lot, the attending physician, personally viewed and interpreted this ECG.   Date: 03/05/2022  EKG Time: 17:43  Rate: 55  Rhythm: sinus  Axis: normal  Intervals:normal  ST&T Change: no stemi, no depression   RADIOLOGY Please see ED Course for my review and interpretation.  I personally reviewed all radiographic images ordered to evaluate for the  above acute complaints and reviewed radiology reports and findings.  These findings were personally discussed with the patient.  Please see medical record for radiology report.    PROCEDURES:  Critical Care performed: Yes, see critical care procedure note(s)  .Critical Care  Performed by: Merlyn Lot, MD Authorized by: Merlyn Lot, MD   Critical care provider statement:    Critical care time (minutes):  40   Critical care was necessary to treat or prevent imminent or life-threatening deterioration of the following conditions:  Respiratory failure   Critical care was time spent personally by me on the following activities:  Ordering and performing treatments and interventions, ordering and review of laboratory studies, ordering and review of radiographic studies, pulse oximetry, re-evaluation of patient's condition, review of old charts, obtaining history from patient or surrogate, examination of patient, evaluation of patient's response to treatment, discussions with primary provider, discussions with consultants and development of treatment plan with patient or surrogate    MEDICATIONS ORDERED IN ED: Medications  lactated ringers infusion (has no administration in time range)  vancomycin (VANCOCIN) IVPB 1000 mg/200 mL premix (has no administration in time range)  sodium chloride 0.9 % bolus 500 mL (has no administration in time range)  norepinephrine (LEVOPHED) '4mg'$  in 222m (0.016 mg/mL) premix infusion (has no administration in time range)  docusate sodium (COLACE) capsule 100 mg (has no administration in time range)  polyethylene glycol (MIRALAX / GLYCOLAX) packet 17 g (has no administration in time range)  insulin aspart (novoLOG) injection 0-9 Units (has no administration in time range)  heparin injection 5,000 Units (has no administration in time range)  ipratropium-albuterol (DUONEB) 0.5-2.5 (3) MG/3ML nebulizer solution 3 mL (has no administration in time range)   ipratropium-albuterol (DUONEB) 0.5-2.5 (3) MG/3ML nebulizer solution 3 mL (has no administration in time range)  budesonide (PULMICORT) nebulizer solution 0.25 mg (has no administration in time range)  furosemide (LASIX) injection 20 mg (has no administration in time range)  sodium chloride 0.9 % bolus 500 mL (0 mLs Intravenous Stopped 03/05/22 2234)  cefTRIAXone (ROCEPHIN) 2 g in sodium chloride 0.9 % 100 mL IVPB (0 g Intravenous Stopped 03/05/22 2308)     IMPRESSION / MDM / [place]/ ED COURSE  I reviewed the triage vital signs and the nursing notes.  Differential diagnosis includes, but is not limited to, Asthma, copd, CHF, pna, ptx, malignancy, Pe, anemia  Patient presented to the ER for evaluation of symptoms as described above.  This presenting complaint could reflect a potentially life-threatening illness therefore the patient will be placed on continuous pulse oximetry and telemetry for monitoring.  Laboratory evaluation will be sent to evaluate for the above complaints.   Will order imaging.  Patient protecting her airway.  Normotensive does appear to have significant edema.   Clinical Course as of 03/05/22 2309  Thu Mar 05, 2022  2100 Chest x-ray on my review and interpretation is consistent with mild CHF. [PR]  2132 MCHC(!): 27.2 [PR]  2134 Patient now with decreasing BP we will give IV fluids will place on BiPAP given concern for CHF but may be intravascularly depleted and third spacing.  Will order cultures.  Will order antibiotics at this time as she has some cellulitis of the left lower extremity. [PR]  2241 Patient reassessed.  Appears comfortable on BiPAP.  Blood pressure still low systolic but she is mentating well.  We will give additional IV fluids. [PR]  2250 Patient's lactate is normal but given her low blood pressures congestive heart failure and acute respiratory failure with hypoxia will continue with IV fluids and judicious  resuscitation.  Will start on low-dose nor epi as I do not want to put her into worsening respiratory failure which she has had happened in the past is low output heart failure is on my differential.  I discussed case in consultation with intensivist who kindly agrees to admit patient to their service. [PR]    Clinical Course User Index [PR] Merlyn Lot, MD    FINAL CLINICAL IMPRESSION(S) / ED DIAGNOSES   Final diagnoses:  Acute on chronic respiratory failure with hypoxia and hypercapnia ([place])     Rx / DC Orders   ED Discharge Orders     None        Note:  This document was prepared using Dragon voice recognition software and may include unintentional dictation errors.    Merlyn Lot, MD 03/05/22 [phone]

## 2022-03-05 NOTE — Consult Note (Signed)
PHARMACY -  BRIEF ANTIBIOTIC NOTE   Pharmacy has received consult(s) for vancomycin from an ED provider. Patient is also ordered ceftriaxone. The patient's profile has been reviewed for ht/wt/allergies/indication/available labs.    One time order(s) placed for  --Vancomycin 1 g IV  Further antibiotics/pharmacy consults should be ordered by admitting physician if indicated.                       Thank you, Benita Gutter 03/05/2022  9:37 PM

## 2022-03-05 NOTE — ED Triage Notes (Signed)
PT coming POV from home for [facility]. Pt PMH of CHF and pt has been shob for a few days and has resorted to wearing o2 at home for sats in the 60's per patient. Pt arrives today with RA sat of 72%. Pt fingertips are cyanotic and pt appears drowsy.   Pt placed on 3L [place] in triage.   Pt stating they took an extra water pill. Pt shob while talking in short sentences.

## 2022-03-05 NOTE — ED Notes (Signed)
Respiratory was contacted about not having connector on BiPAP to give Pulmicort. Resp said they would put in the connector

## 2022-03-06 ENCOUNTER — Inpatient Hospital Stay
Admit: 2022-03-06 | Discharge: 2022-03-06 | Disposition: A | Discharge status: Home / Self Care | Payer: Medicare Other | Attending: Nurse Practitioner | Admitting: Nurse Practitioner

## 2022-03-06 ENCOUNTER — Inpatient Hospital Stay: Payer: Medicare Other

## 2022-03-06 DIAGNOSIS — A419 Sepsis, unspecified organism: Secondary | ICD-10-CM

## 2022-03-06 LAB — CBC
HCT: 36.7 % (ref 36.0–46.0)
Hemoglobin: 9.8 g/dL — ABNORMAL LOW (ref 12.0–15.0)
MCH: 23.4 pg — ABNORMAL LOW (ref 26.0–34.0)
MCHC: 26.7 g/dL — ABNORMAL LOW (ref 30.0–36.0)
MCV: 87.6 fL (ref 80.0–100.0)
Platelets: 364 10*3/uL (ref 150–400)
RBC: 4.19 MIL/uL (ref 3.87–5.11)
RDW: 19.6 % — ABNORMAL HIGH (ref 11.5–15.5)
WBC: 16 10*3/uL — ABNORMAL HIGH (ref 4.0–10.5)
nRBC: 0.3 % — ABNORMAL HIGH (ref 0.0–0.2)

## 2022-03-06 LAB — PROCALCITONIN: Procalcitonin: 0.41 ng/mL

## 2022-03-06 LAB — BASIC METABOLIC PANEL
Anion gap: 9 (ref 5–15)
BUN: 65 mg/dL — ABNORMAL HIGH (ref 8–23)
CO2: 34 mmol/L — ABNORMAL HIGH (ref 22–32)
Calcium: 8.2 mg/dL — ABNORMAL LOW (ref 8.9–10.3)
Chloride: 99 mmol/L (ref 98–111)
Creatinine, Ser: 2 mg/dL — ABNORMAL HIGH (ref 0.44–1.00)
GFR, Estimated: 27 mL/min — ABNORMAL LOW (ref >60)
Glucose, Bld: 117 mg/dL — ABNORMAL HIGH (ref 70–99)
Potassium: 5 mmol/L (ref 3.5–5.1)
Sodium: 142 mmol/L (ref 135–145)

## 2022-03-06 LAB — ECHOCARDIOGRAM COMPLETE
AR max vel: 2.05 cm2
AV Area VTI: 1.87 cm2
AV Area mean vel: 2.05 cm2
AV Mean grad: 8 mmHg
AV Peak grad: 15.8 mmHg
Ao pk vel: 1.99 m/s
Area-P 1/2: 2.86 cm2
Calc EF: 55.3 %
Height: 64 in
P 1/2 time: 624 msec
S' Lateral: 4.53 cm
Single Plane A2C EF: 54.5 %
Single Plane A4C EF: 57.5 %
Weight: 5054.71 oz

## 2022-03-06 LAB — GLUCOSE, CAPILLARY
Glucose-Capillary: 105 mg/dL — ABNORMAL HIGH (ref 70–99)
Glucose-Capillary: 92 mg/dL (ref 70–99)
Glucose-Capillary: 82 mg/dL (ref 70–99)
Glucose-Capillary: 110 mg/dL — ABNORMAL HIGH (ref 70–99)
Glucose-Capillary: 136 mg/dL — ABNORMAL HIGH (ref 70–99)

## 2022-03-06 LAB — URINALYSIS, COMPLETE (UACMP) WITH MICROSCOPIC
Bilirubin Urine: NEGATIVE
Glucose, UA: NEGATIVE mg/dL
Ketones, ur: NEGATIVE mg/dL
Nitrite: NEGATIVE
Protein, ur: NEGATIVE mg/dL
Specific Gravity, Urine: 1.009 (ref 1.005–1.030)
pH: 5 (ref 5.0–8.0)

## 2022-03-06 LAB — HEMOGLOBIN A1C
Hgb A1c MFr Bld: 6.4 % — ABNORMAL HIGH (ref 4.8–5.6)
Mean Plasma Glucose: 136.98 mg/dL

## 2022-03-06 LAB — MAGNESIUM: Magnesium: 2.3 mg/dL (ref 1.7–2.4)

## 2022-03-06 LAB — MRSA NEXT GEN BY PCR, NASAL: MRSA by PCR Next Gen: NOT DETECTED

## 2022-03-06 LAB — LACTIC ACID, PLASMA: Lactic Acid, Venous: 0.9 mmol/L (ref 0.5–1.9)

## 2022-03-06 LAB — TROPONIN I (HIGH SENSITIVITY): Troponin I (High Sensitivity): 29 ng/L — ABNORMAL HIGH (ref <18)

## 2022-03-06 LAB — CBG MONITORING, ED: Glucose-Capillary: 110 mg/dL — ABNORMAL HIGH (ref 70–99)

## 2022-03-06 LAB — STREP PNEUMONIAE URINARY ANTIGEN: Strep Pneumo Urinary Antigen: NEGATIVE

## 2022-03-06 LAB — PHOSPHORUS: Phosphorus: 6.3 mg/dL — ABNORMAL HIGH (ref 2.5–4.6)

## 2022-03-06 MED ORDER — VANCOMYCIN HCL IN DEXTROSE 1-5 GM/200ML-% IV SOLN
1000.0000 mg | Freq: Two times a day (BID) | INTRAVENOUS | Status: DC
Start: 2022-03-06 — End: 2022-03-08
  Administered 2022-03-06 – 2022-03-07 (×3): 1000 mg via INTRAVENOUS
  Filled 2022-03-06 (×5): qty 200

## 2022-03-06 MED ORDER — VANCOMYCIN HCL 1500 MG/300ML IV SOLN
1500.0000 mg | Freq: Once | INTRAVENOUS | Status: AC
  Administered 2022-03-06: 1500 mg via INTRAVENOUS
  Filled 2022-03-06 (×2): qty 300

## 2022-03-06 MED ORDER — PANTOPRAZOLE SODIUM 40 MG PO TBEC
40.0000 mg | DELAYED_RELEASE_TABLET | Freq: Every day | ORAL | Status: DC
  Administered 2022-03-07 – 2022-03-19 (×13): 40 mg via ORAL
  Filled 2022-03-06 (×13): qty 1

## 2022-03-06 MED ORDER — ROPINIROLE HCL 1 MG PO TABS
3.0000 mg | ORAL_TABLET | Freq: Three times a day (TID) | ORAL | Status: DC
  Administered 2022-03-06 – 2022-03-11 (×15): 3 mg via ORAL
  Filled 2022-03-06 (×16): qty 3

## 2022-03-06 MED ORDER — BUPROPION HCL ER (XL) 150 MG PO TB24
150.0000 mg | ORAL_TABLET | Freq: Every day | ORAL | Status: DC
  Administered 2022-03-07 – 2022-03-19 (×13): 150 mg via ORAL
  Filled 2022-03-06 (×13): qty 1

## 2022-03-06 MED ORDER — CHLORHEXIDINE GLUCONATE CLOTH 2 % EX PADS
6.0000 | MEDICATED_PAD | Freq: Every day | CUTANEOUS | Status: DC
  Administered 2022-03-06 – 2022-03-09 (×4): 6 via TOPICAL

## 2022-03-06 MED ORDER — GABAPENTIN 300 MG PO CAPS
300.0000 mg | ORAL_CAPSULE | Freq: Three times a day (TID) | ORAL | Status: DC
  Administered 2022-03-06 – 2022-03-11 (×14): 300 mg via ORAL
  Filled 2022-03-06 (×14): qty 1

## 2022-03-06 MED ORDER — ORAL CARE MOUTH RINSE
15.0000 mL | OROMUCOSAL | Status: DC
  Administered 2022-03-06 – 2022-03-09 (×15): 15 mL via OROMUCOSAL

## 2022-03-06 MED ORDER — LEVOTHYROXINE SODIUM 25 MCG PO TABS
125.0000 ug | ORAL_TABLET | Freq: Every day | ORAL | Status: DC
  Administered 2022-03-07 – 2022-03-19 (×13): 125 ug via ORAL
  Filled 2022-03-06 (×13): qty 1

## 2022-03-06 MED ORDER — PERFLUTREN LIPID MICROSPHERE
1.0000 mL | INTRAVENOUS | Status: AC | PRN
  Administered 2022-03-06: 2 mL via INTRAVENOUS

## 2022-03-06 MED ORDER — ATORVASTATIN CALCIUM 20 MG PO TABS
20.0000 mg | ORAL_TABLET | Freq: Every day | ORAL | Status: DC
  Administered 2022-03-07 – 2022-03-19 (×13): 20 mg via ORAL
  Filled 2022-03-06 (×13): qty 1

## 2022-03-06 MED ORDER — VANCOMYCIN HCL 750 MG/150ML IV SOLN: 750.0000 mg | INTRAVENOUS | Status: DC

## 2022-03-06 MED ORDER — APIXABAN 5 MG PO TABS
5.0000 mg | ORAL_TABLET | Freq: Two times a day (BID) | ORAL | Status: DC
  Administered 2022-03-06 – 2022-03-19 (×26): 5 mg via ORAL
  Filled 2022-03-06 (×26): qty 1

## 2022-03-06 MED ORDER — SODIUM CHLORIDE 0.9 % IV SOLN
2.0000 g | Freq: Two times a day (BID) | INTRAVENOUS | Status: DC
  Administered 2022-03-06 – 2022-03-07 (×4): 2 g via INTRAVENOUS
  Filled 2022-03-06: qty 12.5
  Filled 2022-03-06: qty 2
  Filled 2022-03-06 (×2): qty 12.5
  Filled 2022-03-06: qty 2

## 2022-03-06 MED ORDER — ORAL CARE MOUTH RINSE
15.0000 mL | OROMUCOSAL | Status: DC | PRN
Start: 2022-03-06 — End: 2022-03-19

## 2022-03-06 NOTE — ED Notes (Signed)
Respiratory contacted to come adjust mask at pt's request

## 2022-03-06 NOTE — Progress Notes (Signed)
   BRIEF PCCM NOTE  Pt was seen earlier by PCCM provider Rufina Falco NP.  Please see their H&P with attestation by Dr. Merrilee Jansky for full assessment & plan.  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  SUBJECTIVE / INTERVAL HISTORY :  -Afebrile, hemodynamically stable ~ is requiring 6 mcg Levophed peripherally -Weaned off BiPAP to 4L [place] and tolerating ~ wanting to eat so will order diet -Venous US of LE is currently being obtained at bedside -Echo pending  OBJECTIVE :   Today's Vitals   03/06/22 0530 03/06/22 0600 03/06/22 0605 03/06/22 0620  BP: (!) 106/56  134/80   Pulse: (!) 50  (!) 50 (!) 49  Resp: '16  16 20  '$ Temp:    98.8 F (37.1 C)  TempSrc:    Axillary  SpO2: 97%  99% 98%  Weight:    (!) 143.3 kg  Height:    '5\' 4"'$  (1.626 m)  PainSc:  0-No pain  0-No pain   Body mass index is 54.23 kg/m.   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) BNP 955 on admission Shock: Septic +/- Cardiogenic Mildly elevated Troponin, suspect demand ischemia PMHx: HTN, HLD, Atrial fibrillation -Continuous cardiac monitoring -Maintain MAP >65 -Stop IVF -Vasopressors as needed to maintain MAP goal -Lactic acid has normalized -Trend HS Troponin until peaked (16 ~ 29 ~  ) -Repeat Echocardiogram pending -Check cortisol -Diuresis as BP and renal function permits ~ currently holding due to AKI -Hold home carvedilol 6.'25mg'$  BID, torsemine '40mg'$   due to hypotension -Continue Eliquis and Atorvastatin -Check cortisol  Severe Sepsis in the setting of suspected Cellulitis of bilateral lower extremities PMHx: chronic bilateral venous stasis Meets SIRS Criteria  -Monitor fever curve -Trend WBC's & Procalcitonin -Follow cultures as above -Check UA -Continue empiric Cefepime & Vancomycin pending cultures & sensitivities -Venous US BLE pending -Wound care  AKI on CKD Stage III -Monitor I&O's / urinary output -Follow BMP -Ensure adequate renal perfusion -Avoid nephrotoxic agents as able -Replace electrolytes as indicated -Pharmacy following for assistance with electrolyte replacement  Diabetes Mellitus Type II Hgb A1c is 6.4 -CBG's AC & qhs; Target range of 140 to 180 -SSI -Follow ICU Hypo/Hyperglycemia protocol  Hypothyroidism -Continue home Synthroid -TSH and thyroid panel pending           Additional Critical Care Time: 30 minutes  Darel Hong, AGACNP-BC [facility] Prefer epic messenger for cross cover needs If after hours, please call E-link

## 2022-03-06 NOTE — Plan of Care (Signed)
Continuing with plan of care. 

## 2022-03-06 NOTE — Progress Notes (Signed)
Pharmacy Antibiotic Note  Madison Mcbride is a 66 y.o. female w/ PMH of CHF, OSA on CPAP, HLD, T2DM, Hypothyroidism, Afib, RLS, Thyroid cancer and chronic Lymphedema, RLS admitted on 03/05/2022 with sepsis secondary to wound infection.  Pharmacy has been consulted for cefepime & vancomycin dosing. Serum creatinine is elevated above what appears to be her baseline level  Plan:  1) continue cefepime 2 gm q12h per indication & renal fxn  2) adjust vancomycin dose to 1000 mg IV every 12 hours Goal AUC 400-550. Expected AUC: 503.6 SCr used: 2.0 mg/dL Ke 0.056 h-1, T1/2 12.1 h  Pharmacy will continue to follow and will adjust abx dosing whenever warranted.  Height: '5\' 4"'$  (162.6 cm) Weight: (!) 143.3 kg (315 lb 14.7 oz) (per ED) IBW/kg (Calculated) : 54.7  Temp (24hrs), Avg:98.1 F (36.7 C), Min:97.7 F (36.5 C), Max:98.8 F (37.1 C)   Recent Labs  Lab 03/05/22 1743 03/05/22 2140 03/06/22 0404  WBC 14.5*  --  16.0*  CREATININE 1.97*  --  2.00*  LATICACIDVEN  --  1.0 0.9     Estimated Creatinine Clearance: 38.8 mL/min (A) (by C-G formula based on SCr of 2 mg/dL (H)).    No Known Allergies  Antimicrobials this admission: 8/24 Ceftriaxone >> x 1 dose 8/24 vancomycin >>  8/25 cefepime >>   Microbiology results: 8/24 BCx: NG < 12h 8/25 MRSA PCR negative  Thank you for allowing pharmacy to be a part of this patient's care.  Vallery Sa, PharmD, BCPS  03/06/2022 7:14 AM

## 2022-03-06 NOTE — Progress Notes (Signed)
PHARMACY CONSULT NOTE   Pharmacy Consult for Electrolyte Monitoring and Replacement   Recent Labs: Potassium (mmol/L)  Date Value  03/06/2022 5.0  01/18/2014 3.7   Magnesium (mg/dL)  Date Value  03/06/2022 2.3   Calcium (mg/dL)  Date Value  03/06/2022 8.2 (L)   Calcium, Total (mg/dL)  Date Value  01/18/2014 8.2 (L)   Albumin (g/dL)  Date Value  03/05/2022 3.1 (L)  01/17/2014 2.6 (L)   Phosphorus (mg/dL)  Date Value  03/06/2022 6.3 (H)   Sodium (mmol/L)  Date Value  03/06/2022 142  01/18/2014 138    Assessment: 66 y.o. female w/ PMH of CHF, OSA on CPAP, HLD, T2DM, Hypothyroidism, Afib, RLS, Thyroid cancer and chronic Lymphedema, RLS admitted on 03/05/2022 with sepsis secondary to wound infection. Pharmacy is asked to follow and replace electrolytes while in CCU  Diuretics: furosemide 20 mg IV BID  Goal of Therapy:  Electrolytes WNL  Plan:  No electrolyte replacement warranted for today Recheck electrolytes in am  Dallie Piles ,PharmD Clinical Pharmacist 03/06/2022 2:19 PM

## 2022-03-06 NOTE — Progress Notes (Signed)
Patient was taken off of bipap early in the shift and placed on 4L [place] by RT; patient has tolerated well so far.  Ultrasound guided IV was also placed earlier in the shift by IV team, patient tolerated well.

## 2022-03-06 NOTE — Progress Notes (Signed)
*  PRELIMINARY RESULTS* Echocardiogram 2D Echocardiogram has been performed.  Madison Mcbride 03/06/2022, 4:34 PM

## 2022-03-06 NOTE — Plan of Care (Signed)
Discussed with patient plan of care foe the day, pain management, admission questions and Bipap with some teach back displayed.  Explained why she couldn't have fluids or eat with the Bipap on.  Problem: Education: Goal: Ability to describe self-care measures that may prevent or decrease complications (Diabetes Survival Skills Education) will improve Outcome: Progressing   Problem: Coping: Goal: Ability to adjust to condition or change in health will improve Outcome: Progressing

## 2022-03-06 NOTE — Progress Notes (Signed)
Pharmacy Antibiotic Note  Madison Mcbride is a 66 y.o. female admitted on 03/05/2022 with sepsis secondary to wound infection.  Pharmacy has been consulted for Cefepime & Vancomycin dosing.  Plan: Cefepime 2 gm q12h per indication & renal fxn  Pt given Vancomycin 1000 mg once. Ordered additional Vancomycin 1500 mg once. Vancomycin 750 mg IV Q 24 hrs. Goal AUC 400-550. Expected AUC: 431.4 SCr used: 1.97, Vd used: 0.5, BMI: 54.24  Pharmacy will continue to follow and will adjust abx dosing whenever warranted.  Height: '5\' 4"'$  (162.6 cm) Weight: (!) 143.3 kg (316 lb) IBW/kg (Calculated) : 54.7  Temp (24hrs), Avg:97.9 F (36.6 C), Min:97.7 F (36.5 C), Max:98.1 F (36.7 C)   Recent Labs  Lab 03/05/22 1743 03/05/22 2140 03/06/22 0404  WBC 14.5*  --  16.0*  CREATININE 1.97*  --   --   LATICACIDVEN  --  1.0  --     Estimated Creatinine Clearance: 39.4 mL/min (A) (by C-G formula based on SCr of 1.97 mg/dL (H)).    No Known Allergies  Antimicrobials this admission: 8/24 Ceftriaxone >> x 1 dose 8/24 Vancomycin >>  8/25 Cefepime >>   Microbiology results: 8/24 BCx: Pending  Thank you for allowing pharmacy to be a part of this patient's care.  Renda Rolls, PharmD, [facility] 03/06/2022 4:36 AM

## 2022-03-06 NOTE — ED Notes (Signed)
Report given to Hansel Starling, RN. Resp called to help transport pt to ICU.

## 2022-03-06 NOTE — H&P (Addendum)
NAME:  Madison Mcbride, MRN:  [id number], DOB:  22-Aug-1955, LOS: 1 ADMISSION DATE:  03/05/2022, CONSULTATION DATE:  03/06/2022 REFERRING MD:  Merlyn Lot  CHIEF COMPLAINT:  SOB    HPI  66 y.o female with significant PMH of HFpEF (LVEF 45-50% 03/2021, OSA on CPAP, HLD, T2DM, Hypothyroidism, Afib, RLS, Thyroid cancer and chronic Lymphedema who presented to the ED with chief complaints of worsening shortness of breath.  Per patient's husband who is at the bedside, patient has required increased oxygenation for the past few days due to persistent hypoxia in the 60's and activity intolerance.  ED Course: In the emergency department, vital signs showeD blood pressure of 109/55 mm Hg, HR of 55 beats per minute, a respiratory rate of 12 breaths per minute, and a temperature of 98.65F (36.7C) sats  100% on NRB  Pertinent Labs/Diagnostics Findings: Chemistry:Na+/ K+: 139/4.9 Glucose: 31 BUN/Cr.:63/ 1.97  CBC: WBC: 14.5 Hgb/Hct:9.9/36.4   Other Lab findings:   PCT: 0.41 Lactic acid:1.0  Troponin:16  BNP:955  Venous Blood Gas result:  pO2 67; pCO2 66; pH 7.31;  HCO3 33.2, %O2 Sat 85.9.  Imaging:  CXR> Cardiomegaly with central vascular prominence and mild interstitial edema  Patient placed on BiPAP given concerns for mild CHF exacerbation.  Due to concerns for possible sepsis secondary to cellulitis, patient received gentle IV fluid boluses and was started on broad-spectrum antibiotics.  Despite IV fluid boluses patient remained hypotensive therefore Levophed was started and PCCM consulted.  Past Medical History   HFpEF (LVEF 45-50% 03/2021, OSA on CPAP, HLD, T2DM, Hypothyroidism, RLS, Thyroid cancer and chronic Lymphedema  Significant Hospital Events   8/25: Admitted to ICU with Acute on Chronic hypoxic hypercapnic respiratory failure  Consults:  None  Procedures:  None  Significant Diagnostic Tests:  8/24: Chest Xray>Cardiomegaly with central vascular prominence and mild  interstitial edema 8/25: CTA Chest>  Micro Data:  8/24: Blood culture x2> 8/24: Urine Culture> 8/24: MRSA PCR>>  8/24: Strep pneumo urinary antigen> 8/24: Legionella urinary antigen>  Antimicrobials:  Vancomycin 8/24> Cefepime 8/24 >  OBJECTIVE  Blood pressure 121/70, pulse (!) 48, temperature 97.9 F (36.6 C), temperature source Axillary, resp. rate 18, weight (!) 143.3 kg, SpO2 100 %.       No intake or output data in the 24 hours ending 03/06/22 0217 Filed Weights   03/05/22 1737  Weight: (!) 143.3 kg   Physical Examination  GENERAL: 66  year-old critically ill patient lying in the bed on BiPAP.  EYES: Pupils equal, round, reactive to light and accommodation. No scleral icterus. Extraocular muscles intact.  HEENT: Head atraumatic, normocephalic. Oropharynx and nasopharynx clear.  NECK:  Supple, no jugular venous distention. No thyroid enlargement, no tenderness.  LUNGS: Normal breath sounds bilaterally, no wheezing, rales,rhonchi or crepitation. No use of accessory muscles of respiration.  CARDIOVASCULAR: S1, S2 normal. No murmurs, rubs, or gallops.  ABDOMEN: Soft, nontender,  OBESE, distended. Bowel sounds present. No organomegaly or mass.  EXTREMITIES:SEE BELOW NEUROLOGIC: Cranial nerves II through XII are intact.  Muscle strength 5/5 in all extremities. Sensation intact. Gait not checked.  PSYCHIATRIC: The patient is alert and oriented x 3.  SKIN:SEE BELOW     Labs/imaging that I havepersonally reviewed  (right click and "Reselect all SmartList Selections" daily)     Labs   CBC: Recent Labs  Lab 03/05/22 1743  WBC 14.5*  NEUTROABS 11.7*  HGB 9.9*  HCT 36.4  MCV 88.3  PLT 676    Basic Metabolic  Panel: Recent Labs  Lab 03/05/22 1743  NA 139  K 4.9  CL 100  CO2 28  GLUCOSE 131*  BUN 63*  CREATININE 1.97*  CALCIUM 8.1*   GFR: CrCl cannot be calculated (Unknown ideal weight.). Recent Labs  Lab 03/05/22 1743 03/05/22 2140  PROCALCITON 0.41   --   WBC 14.5*  --   LATICACIDVEN  --  1.0    Liver Function Tests: Recent Labs  Lab 03/05/22 1743  AST 25  ALT 14  ALKPHOS 92  BILITOT 1.5*  PROT 7.1  ALBUMIN 3.1*   No results for input(s): "LIPASE", "AMYLASE" in the last 168 hours. No results for input(s): "AMMONIA" in the last 168 hours.  ABG    Component Value Date/Time   PHART 7.37 07/20/2021 1728   PCO2ART 63 (H) 07/20/2021 1728   PO2ART 77 (L) 07/20/2021 1728   HCO3 33.2 (H) 03/05/2022 2118   O2SAT 85.9 03/05/2022 2118     Coagulation Profile: No results for input(s): "INR", "PROTIME" in the last 168 hours.  Cardiac Enzymes: No results for input(s): "CKTOTAL", "CKMB", "CKMBINDEX", "TROPONINI" in the last 168 hours.  HbA1C: Hgb A1c MFr Bld  Date/Time Value Ref Range Status  03/15/2021 05:27 AM 6.3 (H) 4.8 - 5.6 % Final    Comment:    (NOTE) Pre diabetes:          5.7%-6.4%  Diabetes:              >6.4%  Glycemic control for   <7.0% adults with diabetes   04/09/2020 04:41 PM 5.8 (H) 4.8 - 5.6 % Final    Comment:    (NOTE) Pre diabetes:          5.7%-6.4%  Diabetes:              >6.4%  Glycemic control for   <7.0% adults with diabetes     CBG: Recent Labs  Lab 03/06/22 0037  GLUCAP 110*    Review of Systems:   UNABLE TO ASSESS PATIENT IS CURRENTLY ON BIPAP  Past Medical History  She,  has a past medical history of CHF (congestive heart failure) ([place]), Diabetes mellitus without complication ([place]), Hypothyroidism, Lymphedema, RLS (restless legs syndrome), Sleep apnea, and Thyroid cancer ([place]) (2007).   Surgical History    Past Surgical History:  Procedure Laterality Date   ABDOMINAL HYSTERECTOMY     COLONOSCOPY WITH PROPOFOL N/A 08/26/2015   Procedure: COLONOSCOPY WITH PROPOFOL;  Surgeon: Manya Silvas, MD;  Location: [facility] ENDOSCOPY;  Service: Endoscopy;  Laterality: N/A;   THYROIDECTOMY       Social History   reports that she has never smoked. She has never used smokeless  tobacco. She reports that she does not drink alcohol and does not use drugs.   Family History   Her family history is negative for Breast cancer.   Allergies No Known Allergies   Home Medications  Prior to Admission medications   Medication Sig Start Date End Date Taking? Authorizing Provider  apixaban (ELIQUIS) 5 MG TABS tablet Take 1 tablet (5 mg total) by mouth 2 (two) times daily. 07/25/21  Yes Dessa Phi, DO  atorvastatin (LIPITOR) 20 MG tablet Take 20 mg by mouth daily. 01/31/20  Yes [provider]  Biotin 2.5 MG CAPS Take 2.5 mg by mouth daily.    Yes [provider]  buPROPion (WELLBUTRIN XL) 150 MG 24 hr tablet Take 150 mg by mouth daily. 01/10/21  Yes [provider]  carvedilol (  COREG) 3.125 MG tablet Take 1 tablet (3.125 mg total) by mouth 2 (two) times daily with a meal. 07/28/21  Yes Dessa Phi, DO  celecoxib (CELEBREX) 200 MG capsule Take 200 mg by mouth 2 (two) times daily. 02/10/22  Yes [provider]  Cholecalciferol 25 MCG (1000 UT) tablet Take 1 tablet by mouth daily.   Yes [provider]  gabapentin (NEURONTIN) 300 MG capsule Take 300 mg by mouth 3 (three) times daily. 02/09/22  Yes [provider]  levothyroxine (SYNTHROID) 125 MCG tablet Take 1 tablet (125 mcg total) by mouth daily. 07/26/21  Yes Dessa Phi, DO  Multiple Vitamin (MULTIVITAMIN) tablet Take 1 tablet by mouth daily.   Yes [provider]  pantoprazole (PROTONIX) 40 MG tablet Take 40 mg by mouth daily. 01/11/21  Yes [provider]  potassium chloride SA (KLOR-CON M) 20 MEQ tablet Take 20 mEq by mouth daily. 12/15/21  Yes [provider]  propranolol (INDERAL) 10 MG tablet Take 20 mg by mouth 4 (four) times daily.   Yes [provider]  rOPINIRole (REQUIP) 3 MG tablet Take 3 mg by mouth 3 (three) times daily. 01/20/18  Yes [provider]  Semaglutide, 1 MG/DOSE, 2 MG/1.5ML SOPN Inject 0.75 mLs into the skin  once a week. 10/14/20  Yes [provider]  torsemide (DEMADEX) 20 MG tablet Take 2 tablets (40 mg total) by mouth daily. 04/14/21  Yes Darylene Price A, FNP  zinc gluconate 50 MG tablet Take 50 mg by mouth daily.   Yes [provider]  Scheduled Meds:  budesonide (PULMICORT) nebulizer solution  0.25 mg Nebulization BID   furosemide  20 mg Intravenous BID   heparin  5,000 Units Subcutaneous Q8H   insulin aspart  0-9 Units Subcutaneous Q4H   ipratropium-albuterol  3 mL Nebulization Q6H   Continuous Infusions:  lactated ringers 150 mL/hr at 03/06/22 0419   norepinephrine (LEVOPHED) Adult infusion 8 mcg/min (03/06/22 0005)   PRN Meds:.docusate sodium, ipratropium-albuterol, polyethylene glycol   Active Hospital Problem list     Assessment & Plan:  Acute Hypoxic Hypercapnic Respiratory Failure secondary to CHF exacerbation  OSA on CPAP COPD without evidence of acute exacerbation -Supplemental O2 as needed to maintain O2 saturations 88 to 92% -BiPAP, wean as tolerated -High risk for intubation -Follow intermittent ABG and chest x-ray as needed -Scheduled and as needed bronchodilators   Acute on chronic HFpEF (LVEF 45-50% 03/2021) -Hypertension Hx: Chronic Atrial Fibrillation, HLD  -Continuous cardiac monitoring -Maintain MAP greater than 65 -IV Lasix as blood pressure and renal function permits; currently on Lasix 40 mg IV daily  -Hold home carvedilol 6.'25mg'$  BID, torsemine '40mg'$   due to hypotension -Continue Eliquis -Continue atorvastatin -Cardiology  consult -Repeat 2D Echocardiogram  Sepsis in the setting of suspected Cellulitis of bilateral lower extremities Meets SIRS Criteria  -Monitor fever curve -Trend WBC's & Procalcitonin -Follow cultures  -Vasopressors to keep map goal -Continue empiric antibiotics pending cultures & sensitivities   AKI on CKD Stage III -Monitor I&O's / urinary output -Follow BMP -Ensure adequate renal perfusion -Avoid  nephrotoxic agents as able -Replace electrolytes as indicated  WC:[id number] bilateral venous stasis  -Wound care -Obtain ultrasound DVT   Diabetes mellitus T2 HgbA1c pending -CBG's AC & hs; Target range of 140 to 180 -SSI -Follow ICU Hypo/Hyperglycemia protocol  Hypothyroidism Check TSH and free T4 Continue Synthroid  Best practice:  Diet:  NPO Pain/Anxiety/Delirium protocol (if indicated): No VAP protocol (if indicated): Not indicated DVT prophylaxis: Systemic  AC GI prophylaxis: PPI Glucose control:  SSI Yes Central venous access:  N/A Arterial line:  N/A Foley:  Yes, and it is still needed Mobility:  bed rest  PT consulted: N/A Last date of multidisciplinary goals of care discussion [8/25] Code Status:  full code Disposition: ICU   = Goals of Care = Code Status Order: FULL  Primary Emergency Contact: Sgro,Benjamin, Home Phone: [phone] Wishes to pursue full aggressive treatment and intervention options, including CPR and intubation, but goals of care will be addressed on going with family if that should become necessary.  Critical care time: 45 minutes       Rufina Falco, DNP, CCRN, FNP-C, AGACNP-BC Acute Care Nurse Practitioner [facility]  PCCM on call pager [phone] until 7 am

## 2022-03-06 NOTE — ED Notes (Signed)
CT contacted about CT Scan. This nurse was told GFR was not adequate to do scan. Pt to receive fluids and possibly have it done in the AM.

## 2022-03-06 NOTE — Progress Notes (Signed)
An USGPIV (ultrasound guided PIV) 20g in R forearm has been placed for short-term vasopressor infusion. A correctly placed ivWatch must be used when administering Vasopressors. Should this treatment be needed beyond 72 hours, central line access should be obtained.  It will be the responsibility of the bedside nurse to follow best practice to prevent extravasations.

## 2022-03-07 DIAGNOSIS — A419 Sepsis, unspecified organism: Secondary | ICD-10-CM | POA: Diagnosis not present

## 2022-03-07 LAB — BLOOD GAS, VENOUS
Acid-Base Excess: 8.3 mmol/L — ABNORMAL HIGH (ref 0.0–2.0)
Bicarbonate: 38 mmol/L — ABNORMAL HIGH (ref 20.0–28.0)
O2 Saturation: 78.5 %
Patient temperature: 37
pCO2, Ven: 79 mmHg (ref 44–60)
pH, Ven: 7.29 (ref 7.25–7.43)
pO2, Ven: 54 mmHg — ABNORMAL HIGH (ref 32–45)

## 2022-03-07 LAB — CBC
HCT: 34.5 % — ABNORMAL LOW (ref 36.0–46.0)
Hemoglobin: 9.4 g/dL — ABNORMAL LOW (ref 12.0–15.0)
MCH: 23.2 pg — ABNORMAL LOW (ref 26.0–34.0)
MCHC: 27.2 g/dL — ABNORMAL LOW (ref 30.0–36.0)
MCV: 85.2 fL (ref 80.0–100.0)
Platelets: 273 10*3/uL (ref 150–400)
RBC: 4.05 MIL/uL (ref 3.87–5.11)
RDW: 19.4 % — ABNORMAL HIGH (ref 11.5–15.5)
WBC: 9.7 10*3/uL (ref 4.0–10.5)
nRBC: 0.3 % — ABNORMAL HIGH (ref 0.0–0.2)

## 2022-03-07 LAB — GLUCOSE, CAPILLARY
Glucose-Capillary: 110 mg/dL — ABNORMAL HIGH (ref 70–99)
Glucose-Capillary: 114 mg/dL — ABNORMAL HIGH (ref 70–99)
Glucose-Capillary: 122 mg/dL — ABNORMAL HIGH (ref 70–99)
Glucose-Capillary: 131 mg/dL — ABNORMAL HIGH (ref 70–99)
Glucose-Capillary: 86 mg/dL (ref 70–99)
Glucose-Capillary: 88 mg/dL (ref 70–99)
Glucose-Capillary: 92 mg/dL (ref 70–99)

## 2022-03-07 LAB — BASIC METABOLIC PANEL
Anion gap: 8 (ref 5–15)
BUN: 52 mg/dL — ABNORMAL HIGH (ref 8–23)
CO2: 33 mmol/L — ABNORMAL HIGH (ref 22–32)
Calcium: 8.3 mg/dL — ABNORMAL LOW (ref 8.9–10.3)
Chloride: 101 mmol/L (ref 98–111)
Creatinine, Ser: 1.48 mg/dL — ABNORMAL HIGH (ref 0.44–1.00)
GFR, Estimated: 39 mL/min — ABNORMAL LOW (ref >60)
Glucose, Bld: 83 mg/dL (ref 70–99)
Potassium: 3.6 mmol/L (ref 3.5–5.1)
Sodium: 142 mmol/L (ref 135–145)

## 2022-03-07 LAB — CORTISOL: Cortisol, Plasma: 14.8 ug/dL

## 2022-03-07 LAB — PHOSPHORUS: Phosphorus: 4.6 mg/dL (ref 2.5–4.6)

## 2022-03-07 LAB — MAGNESIUM: Magnesium: 2.2 mg/dL (ref 1.7–2.4)

## 2022-03-07 LAB — PROCALCITONIN: Procalcitonin: 0.22 ng/mL

## 2022-03-07 MED ORDER — POTASSIUM CHLORIDE CRYS ER 20 MEQ PO TBCR
20.0000 meq | EXTENDED_RELEASE_TABLET | Freq: Once | ORAL | Status: AC
  Administered 2022-03-07: 20 meq via ORAL
  Filled 2022-03-07: qty 1

## 2022-03-07 NOTE — Progress Notes (Signed)
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   HPI  66 y.o female with significant PMH of HFpEF (LVEF 45-50% 03/2021, OSA on CPAP, HLD, T2DM, Hypothyroidism, Afib, RLS, Thyroid cancer and chronic Lymphedema who presented to the ED with chief complaints of worsening shortness of breath.  Per patient's husband who is at the bedside, patient has required increased oxygenation for the past few days due to persistent hypoxia in the 60's and activity intolerance.  ED Course: In the emergency department, vital signs showeD blood pressure of 109/55 mm Hg, HR of 55 beats per minute, a respiratory rate of 12 breaths per minute, and a temperature of 98.76F (36.7C) sats  100% on NRB  Pertinent Labs/Diagnostics Findings: Chemistry:Na+/ K+: 139/4.9 Glucose: 31 BUN/Cr.:63/ 1.97  CBC: WBC: 14.5 Hgb/Hct:9.9/36.4   Other Lab findings:   PCT: 0.41 Lactic acid:1.0  Troponin:16  BNP:955  Venous Blood Gas result:  pO2 67; pCO2 66; pH 7.31;  HCO3 33.2, %O2 Sat 85.9.  Imaging:  CXR> Cardiomegaly with central vascular prominence and mild interstitial edema  Patient placed on BiPAP given concerns for mild CHF exacerbation.  Due to concerns for possible sepsis secondary to cellulitis, patient received gentle IV fluid boluses and was started on broad-spectrum antibiotics.  Despite IV fluid boluses patient remained hypotensive therefore Levophed was started and PCCM consulted.  Past Medical History   HFpEF (LVEF 45-50% 03/2021,  OSA on CPAP, HLD, T2DM, Hypothyroidism, RLS, Thyroid cancer and chronic Lymphedema  Significant Hospital Events   8/25: Admitted to ICU with Acute on Chronic hypoxic hypercapnic respiratory failure 8/26: Weaned off pressors overnight. Infectious markers improving, renal function improving, start diuresis  Consults:  None  Procedures:  None  Significant Diagnostic Tests:  8/24: Chest Xray>Cardiomegaly with central vascular prominence and mild interstitial Edema 8/25: Venous US BLE>>: No  lower extremity DVT. 8/25: Echocardiogram>> 1. Left ventricular ejection fraction, by estimation, is 45 to 50%. The  left ventricle has mildly decreased function. The left ventricle has no  regional wall motion abnormalities. Left ventricular diastolic parameters  were normal.   2. Right ventricular systolic function is normal. The right ventricular  size is normal.   3. The mitral valve is normal in structure. Mild to moderate mitral valve  regurgitation. No evidence of mitral stenosis.   4. Tricuspid valve regurgitation is mild to moderate.   5. The aortic valve is normal in structure. Aortic valve regurgitation is moderate. No aortic stenosis is present.   6. The inferior vena cava is normal in size with greater than 50%  respiratory variability, suggesting right atrial pressure of 3 mmHg.   Micro Data:  8/24: Blood culture x2> NGTD 8/24: Urine Culture> 8/24: MRSA PCR>> negative 8/24: Strep pneumo urinary antigen>negative 8/24: Legionella urinary antigen>  Antimicrobials:  Vancomycin 8/24> Cefepime 8/24 >  OBJECTIVE  Blood pressure 113/61, pulse (!) 49, temperature 98.9 F (37.2 C), temperature source Oral, resp. rate 16, height '5\' 4"'$  (1.626 m), weight (!) 153.8 kg, SpO2 (!) 88 %.  FiO2 (%):  [40 %] 40 %   Intake/Output Summary (Last 24 hours) at 03/07/2022 0835 Last data filed at 03/07/2022 0430 Gross per 24 hour  Intake 1191.73 ml  Output 2500 ml  Net -1308.27 ml   Filed  Weights   03/05/22 1737 03/06/22 0620 03/07/22 0500  Weight: (!) 143.3 kg (!) 143.3 kg (!) 153.8 kg   Physical Examination  GENERAL: 66  year-old acute on chronically ill appearing patient, lying in the bed on [place].  EYES: Pupils equal, round, reactive to light and accommodation. No scleral icterus. Extraocular muscles intact.  HEENT: Head atraumatic, normocephalic. Oropharynx and nasopharynx clear.  NECK:  Supple, no jugular venous distention. No thyroid enlargement, no tenderness.  LUNGS: Normal breath sounds bilaterally, no wheezing, rales,rhonchi or crepitation. No use of accessory muscles of respiration.  CARDIOVASCULAR: S1, S2 normal. No murmurs, rubs, or gallops.  ABDOMEN: Soft, nontender,  OBESE, distended. Bowel sounds present. No organomegaly or mass.  EXTREMITIES:SEE BELOW NEUROLOGIC: Cranial nerves II through XII are intact.  Muscle strength 5/5 in all extremities. Sensation intact. Gait not checked.  PSYCHIATRIC: The patient is alert and oriented x 3.  SKIN:SEE BELOW     Labs/imaging that I havepersonally reviewed  (right click and "Reselect all SmartList Selections" daily)     Labs   CBC: Recent Labs  Lab 03/05/22 1743 03/06/22 0404 03/07/22 0545  WBC 14.5* 16.0* 9.7  NEUTROABS 11.7*  --   --   HGB 9.9* 9.8* 9.4*  HCT 36.4 36.7 34.5*  MCV 88.3 87.6 85.2  PLT 343 364 273     Basic Metabolic Panel: Recent Labs  Lab 03/05/22 1743 03/06/22 0404 03/07/22 0545  NA 139 142 142  K 4.9 5.0 3.6  CL 100 99 101  CO2 28 34* 33*  GLUCOSE 131* 117* 83  BUN 63* 65* 52*  CREATININE 1.97* 2.00* 1.48*  CALCIUM 8.1* 8.2* 8.3*  MG  --  2.3 2.2  PHOS  --  6.3* 4.6    GFR: Estimated Creatinine Clearance: 54.9 mL/min (A) (by C-G formula based on SCr of 1.48 mg/dL (H)). Recent Labs  Lab 03/05/22 1743 03/05/22 2140 03/06/22 0404 03/07/22 0545  PROCALCITON 0.41  --   --  0.22  WBC 14.5*  --  16.0* 9.7  LATICACIDVEN  --  1.0 0.9  --      Liver Function  Tests: Recent Labs  Lab 03/05/22 1743  AST 25  ALT 14  ALKPHOS 92  BILITOT 1.5*  PROT 7.1  ALBUMIN 3.1*    No results for input(s): "LIPASE", "AMYLASE" in the last 168 hours. No results for input(s): "AMMONIA" in the last 168 hours.  ABG    Component Value Date/Time   PHART 7.37 07/20/2021 1728   PCO2ART 63 (H) 07/20/2021 1728   PO2ART 77 (L) 07/20/2021 1728   HCO3 38.0 (H) 03/07/2022 0545   O2SAT 78.5 03/07/2022 0545     Coagulation Profile: No results for input(s): "INR", "PROTIME" in the last 168 hours.  Cardiac Enzymes: No results for input(s): "CKTOTAL", "CKMB", "CKMBINDEX", "TROPONINI" in the last 168 hours.  HbA1C: Hgb A1c MFr Bld  Date/Time Value Ref Range Status  03/06/2022 04:04 AM 6.4 (H) 4.8 - 5.6 % Final    Comment:    (NOTE) Pre diabetes:          5.7%-6.4%  Diabetes:              >6.4%  Glycemic control for   <7.0% adults with diabetes  03/15/2021 05:27 AM 6.3 (H) 4.8 - 5.6 % Final    Comment:    (NOTE) Pre diabetes:          5.7%-6.4%  Diabetes:              >6.4%  Glycemic control for   <7.0% adults with diabetes     CBG: Recent Labs  Lab 03/06/22 1605 03/06/22 2014 03/07/22 0001 03/07/22 0401 03/07/22 0820  GLUCAP 110* 136* 92 86 88     Review of Systems:   Positives in BOLD: Gen: Denies fever, chills, weight change, fatigue, night sweats HEENT: Denies blurred vision, double vision, hearing loss, tinnitus, sinus congestion, rhinorrhea, sore throat, neck stiffness, dysphagia PULM: Denies shortness of breath, cough, sputum production, hemoptysis, wheezing CV: Denies chest pain, edema, orthopnea, paroxysmal nocturnal dyspnea, palpitations GI: Denies abdominal pain, nausea, vomiting, diarrhea, hematochezia, melena, constipation, change in bowel habits GU: Denies dysuria, hematuria, polyuria, oliguria, urethral discharge Endocrine: Denies hot or cold intolerance, polyuria, polyphagia or appetite change Derm: Denies rash, dry  skin, scaling or peeling skin change Heme: Denies easy bruising, bleeding, bleeding gums Neuro: Denies headache, numbness, weakness, slurred speech, loss of memory or consciousness   Past Medical History  She,  has a past medical history of CHF (congestive heart failure) ([place]), Diabetes mellitus without complication ([place]), Hypothyroidism, Lymphedema, RLS (restless legs syndrome), Sleep apnea, and Thyroid cancer ([place]) (2007).   Surgical History    Past Surgical History:  Procedure Laterality Date   ABDOMINAL HYSTERECTOMY     COLONOSCOPY WITH PROPOFOL N/A 08/26/2015   Procedure: COLONOSCOPY WITH PROPOFOL;  Surgeon: Manya Silvas, MD;  Location: [facility] ENDOSCOPY;  Service: Endoscopy;  Laterality: N/A;   THYROIDECTOMY       Social History   reports that she has never smoked. She has never used smokeless tobacco. She reports that she does not drink alcohol and does not use drugs.   Family History   Her family history is negative for Breast cancer.   Allergies No Known Allergies   Home Medications  Prior to Admission medications   Medication Sig Start Date End Date Taking? Authorizing Provider  apixaban (ELIQUIS) 5 MG TABS tablet Take 1 tablet (5 mg total) by mouth 2 (two) times daily. 07/25/21  Yes Dessa Phi, DO  atorvastatin (LIPITOR) 20 MG tablet Take 20 mg by mouth daily. 01/31/20  Yes [provider]  Biotin 2.5 MG CAPS Take 2.5 mg by mouth daily.    Yes [provider]  buPROPion (WELLBUTRIN XL) 150 MG 24 hr tablet Take 150 mg by mouth daily. 01/10/21  Yes [provider]  carvedilol (COREG) 3.125 MG tablet Take 1 tablet (3.125 mg total) by mouth 2 (two) times daily with a meal. 07/28/21  Yes Dessa Phi, DO  celecoxib (CELEBREX) 200 MG capsule Take 200 mg by mouth 2 (two) times daily. 02/10/22  Yes [provider]  Cholecalciferol 25 MCG (1000 UT) tablet Take 1 tablet by mouth daily.   Yes [provider]  gabapentin (NEURONTIN) 300  MG capsule Take 300 mg by mouth 3 (three) times daily. 02/09/22  Yes [provider]  levothyroxine (SYNTHROID) 125 MCG tablet Take 1 tablet (125 mcg total) by mouth daily. 07/26/21  Yes Dessa Phi, DO  Multiple Vitamin (MULTIVITAMIN) tablet Take 1 tablet by mouth daily.   Yes [provider]  pantoprazole (PROTONIX) 40 MG tablet Take 40 mg by mouth daily. 01/11/21  Yes [provider]  potassium chloride SA (KLOR-CON M)  20 MEQ tablet Take 20 mEq by mouth daily. 12/15/21  Yes [provider]  propranolol (INDERAL) 10 MG tablet Take 20 mg by mouth 4 (four) times daily.   Yes [provider]  rOPINIRole (REQUIP) 3 MG tablet Take 3 mg by mouth 3 (three) times daily. 01/20/18  Yes [provider]  Semaglutide, 1 MG/DOSE, 2 MG/1.5ML SOPN Inject 0.75 mLs into the skin once a week. 10/14/20  Yes [provider]  torsemide (DEMADEX) 20 MG tablet Take 2 tablets (40 mg total) by mouth daily. 04/14/21  Yes Darylene Price A, FNP  zinc gluconate 50 MG tablet Take 50 mg by mouth daily.   Yes [provider]  Scheduled Meds:  apixaban  5 mg Oral BID   atorvastatin  20 mg Oral Daily   budesonide (PULMICORT) nebulizer solution  0.25 mg Nebulization BID   buPROPion  150 mg Oral Daily   Chlorhexidine Gluconate Cloth  6 each Topical Daily   furosemide  20 mg Intravenous BID   gabapentin  300 mg Oral TID   insulin aspart  0-9 Units Subcutaneous Q4H   ipratropium-albuterol  3 mL Nebulization Q6H   levothyroxine  125 mcg Oral Q0600   mouth rinse  15 mL Mouth Rinse 4 times per day   pantoprazole  40 mg Oral Daily   rOPINIRole  3 mg Oral TID   Continuous Infusions:  ceFEPime (MAXIPIME) IV Stopped (03/06/22 2204)   vancomycin Stopped (03/06/22 2321)   PRN Meds:.docusate sodium, ipratropium-albuterol, mouth rinse, polyethylene glycol   Active Hospital Problem list     Assessment & Plan:   Acute Hypoxic Hypercapnic Respiratory Failure  secondary to CHF exacerbation  OSA on CPAP COPD without evidence of acute exacerbation -Supplemental O2 as needed to maintain O2 sats 88 to 92% -BiPAP qhs and as needed -Follow intermittent Chest X-ray & ABG as needed -Bronchodilators & Pulmicort nebs -Diuresis as BP and renal function permits ~ Start Lasix IV 20 mg BID -Aggressive Pulmonary toilet as able -Mobilize as able, consult PT/OT   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%) BNP 955 on admission Shock: Septic +/- Cardiogenic ~ RESOLVED Mildly elevated Troponin, suspect demand ischemia PMHx: HTN, HLD, Atrial fibrillation Echocardiogram 03/06/22: LVEF 97-41%, normal diastolic parameters, RV systolic function normal, mild to moderate MR, mild to moderate TR -Continuous cardiac monitoring -Maintain MAP >65 -Vasopressors as needed to maintain MAP goal ~ weaned off since evening of 8/25 -Lactic acid has normalized -Trend HS Troponin until peaked (16 ~ 29 ~ ) -Cortisol normal 14.8 -Diuresis as BP and renal function permits ~ Start 20 mg IV Lasix BID now that AKI improving -Consider resuming home carvedilol 6.'25mg'$  BID, torsemine '40mg'$   -Continue Eliquis and Atorvastatin   Severe Sepsis in the setting of suspected Cellulitis of bilateral lower extremities & questionable UTI PMHx: chronic bilateral venous stasis Meets SIRS Criteria  -Monitor fever curve -Trend WBC's & Procalcitonin -Follow cultures as above -Continue empiric Cefepime & Vancomycin pending cultures & sensitivities -Wound care   AKI on CKD Stage III ~ IMPROVING -Monitor I&O's / urinary output -Follow BMP -Ensure adequate renal perfusion -Avoid nephrotoxic agents as able -Replace electrolytes as indicated -Pharmacy following for assistance with electrolyte replacement   Diabetes Mellitus Type II Hgb A1c is 6.4 -CBG's AC & qhs; Target range of 140 to 180 -SSI -Follow ICU Hypo/Hyperglycemia protocol   Hypothyroidism -Continue home Synthroid -TSH and thyroid panel  pending     Best practice:  Diet:  Regular Pain/Anxiety/Delirium protocol (if  indicated): No VAP protocol (if indicated): Not indicated DVT prophylaxis: DOAC GI prophylaxis: PPI Glucose control:  SSI Yes Central venous access:  N/A Arterial line:  N/A Foley:  N/A Mobility:  OOB as tolerated  PT consulted: yes Last date of multidisciplinary goals of care discussion [8/25] Code Status:  full code Disposition: ICU    Critical care time: 40 minutes       Darel Hong, AGACNP-BC [facility] epic messenger for cross cover needs If after hours, please call E-link

## 2022-03-07 NOTE — Evaluation (Signed)
Physical Therapy Evaluation Patient Details Name: Madison Mcbride MRN: [id number] DOB: 1956/07/11 Today's Date: 03/07/2022  History of Present Illness  Pt is a 66 yo female that presented to the ED for SOB, weakness. Admitted to ICU for acaute on chronic hypoxic hypercapnic respiratory failure, sepsis due to wound infection.  PMH of COPD, CHF, DM, lymphedema, thyroid cancer, sleep apnea, afib, RLS.   Clinical Impression  Patient alert, agreeable to PT/OT evaluation. Pt needed extended time to take medications from RN at start of session, assist for set up. She reported at baseline she is modI with rollator, uses a reach for lower body dressing/slip on shoes, and her husband is always present/helps with stair navigation in/out of the home.   The patient required extended time and extensive use of bed rails to transition to sitting EOB, cued for hand placement to maximize safety. Fair sitting balance noted. Pt stated at home she sleeps in her lift recliner. Sit <> stand with BRW and minAx2 initially, but repeated from recliner with CGAx2, extra time. She ambulated ~11f in room with CGAx2 for lines/leads very slowly, but no LOB noted. Pt endorsed her bathroom is a similar distance at home.  Overall the patient demonstrated deficits (see "PT Problem List") that impede the patient's functional abilities, safety, and mobility and would benefit from skilled PT intervention. Recommendation at this time is HHPT with constant supervision/assistance to maximize safety, function, and independence.        Recommendations for follow up therapy are one component of a multi-disciplinary discharge planning process, led by the attending physician.  Recommendations may be updated based on patient status, additional functional criteria and insurance authorization.  Follow Up Recommendations Home health PT      Assistance Recommended at Discharge Frequent or constant Supervision/Assistance  Patient can return  home with the following  A little help with bathing/dressing/bathroom;A little help with walking and/or transfers;Assistance with cooking/housework;Assist for transportation;Help with stairs or ramp for entrance    Equipment Recommendations None recommended by PT  Recommendations for Other Services       Functional Status Assessment Patient has had a recent decline in their functional status and demonstrates the ability to make significant improvements in function in a reasonable and predictable amount of time.     Precautions / Restrictions Precautions Precautions: Fall Restrictions Weight Bearing Restrictions: No      Mobility  Bed Mobility Overal bed mobility: Needs Assistance Bed Mobility: Supine to Sit     Supine to sit: Min guard     General bed mobility comments: educated on use of bed rails, pt did tend to ask for help prior to trying    Transfers Overall transfer level: Needs assistance Equipment used: Rolling walker (2 wheels) Transfers: Sit to/from Stand Sit to Stand: Min assist, Min guard, +2 safety/equipment, +2 physical assistance           General transfer comment: from EOB minAx2, from recliner with BUE support CGA x2 with RW    Ambulation/Gait Ambulation/Gait assistance: Min guard Gait Distance (Feet): 17 Feet Assistive device: Rolling walker (2 wheels)         General Gait Details: very slow, decreased stride length no LOB  Stairs            Wheelchair Mobility    Modified Rankin (Stroke Patients Only)       Balance Overall balance assessment: Needs assistance Sitting-balance support: Feet supported Sitting balance-Leahy Scale: Good       Standing balance-Leahy Scale:  Fair                               Pertinent Vitals/Pain Pain Assessment Pain Assessment: No/denies pain    Home Living Family/patient expects to be discharged to:: Private residence Living Arrangements: Spouse/significant other Available  Help at Discharge: Family;Available 24 hours/day Type of Home: House Home Access: Stairs to enter Entrance Stairs-Rails: Right;Left;Can reach both Entrance Stairs-Number of Steps: 5   Home Layout: One level Home Equipment: Conservation officer, nature (2 wheels);Shower seat;Wheelchair - manual Additional Comments: lift chair    Prior Function Prior Level of Function : Independent/Modified Independent             Mobility Comments: rollator at baseline ADLs Comments: pt reports MOD I in ADL, uses a reacher. does not normally wear socks, just slip on shoes     Hand Dominance        Extremity/Trunk Assessment   Upper Extremity Assessment Upper Extremity Assessment: Generalized weakness    Lower Extremity Assessment Lower Extremity Assessment: Generalized weakness    Cervical / Trunk Assessment Cervical / Trunk Assessment: Normal  Communication   Communication: No difficulties  Cognition Arousal/Alertness: Awake/alert Behavior During Therapy: WFL for tasks assessed/performed Overall Cognitive Status: Within Functional Limits for tasks assessed                                          General Comments      Exercises     Assessment/Plan    PT Assessment Patient needs continued PT services  PT Problem List Decreased strength;Decreased mobility;Decreased activity tolerance;Decreased balance       PT Treatment Interventions DME instruction;Therapeutic exercise;Gait training;Balance training;Stair training;Neuromuscular re-education;Functional mobility training;Therapeutic activities;Patient/family education    PT Goals (Current goals can be found in the Care Plan section)  Acute Rehab PT Goals Patient Stated Goal: to get her strength back PT Goal Formulation: With patient Time For Goal Achievement: 03/21/22 Potential to Achieve Goals: Fair    Frequency Min 2X/week     Co-evaluation PT/OT/SLP Co-Evaluation/Treatment: Yes Reason for Co-Treatment: For  patient/therapist safety;To address functional/ADL transfers PT goals addressed during session: Mobility/safety with mobility OT goals addressed during session: ADL's and self-care       AM-PAC PT "6 Clicks" Mobility  Outcome Measure Help needed turning from your back to your side while in a flat bed without using bedrails?: A Lot Help needed moving from lying on your back to sitting on the side of a flat bed without using bedrails?: A Lot Help needed moving to and from a bed to a chair (including a wheelchair)?: A Little Help needed standing up from a chair using your arms (e.g., wheelchair or bedside chair)?: A Little Help needed to walk in hospital room?: A Little Help needed climbing 3-5 steps with a railing? : A Little 6 Click Score: 16    End of Session   Activity Tolerance: Patient limited by fatigue Patient left: in chair;with call bell/phone within reach Nurse Communication: Mobility status PT Visit Diagnosis: Other abnormalities of gait and mobility (R26.89);Difficulty in walking, not elsewhere classified (R26.2);Muscle weakness (generalized) (M62.81)    Time: 6644-0347 PT Time Calculation (min) (ACUTE ONLY): 27 min   Charges:   PT Evaluation $PT Eval Low Complexity: 1 Low PT Treatments $Therapeutic Activity: 8-22 mins        Beverlee Nims  Megan Salon PT, DPT 10:44 AM,03/07/22

## 2022-03-07 NOTE — Evaluation (Signed)
Occupational Therapy Evaluation Patient Details Name: Madison Mcbride MRN: [id number] DOB: Dec 25, 1955 Today's Date: 03/07/2022   History of Present Illness Pt is a 66 yo female that presented to the ED for SOB, weakness. Admitted to ICU for acaute on chronic hypoxic hypercapnic respiratory failure, sepsis due to wound infection.  PMH of COPD, CHF, DM, lymphedema, thyroid cancer, sleep apnea, afib, RLS.   Clinical Impression   Madison Mcbride was seen for OT evaluation this date. Prior to hospital admission, pt was MOD I using 4WW and reach for mobility/ADLs. Pt lives with spouse in home c 5 STE. Pt presents to acute OT demonstrating impaired ADL performance and functional mobility 2/2 decreased activity tolerance and functional strength/balance deficits. Pt currently requires MAX A don/doff B socks in sitting - reports using reacher at baseline for LBD, husband assists with shoes. Initial MIN A x2 + RW sit<>stand from elevated bed, improved to CGA + RW for simulated toilet t/f, +2 for lines mgmt. Pt would benefit from skilled OT to address noted impairments and functional limitations (see below for any additional details). Upon hospital discharge, recommend HHOT to maximize pt safety and return to PLOF.   Recommendations for follow up therapy are one component of a multi-disciplinary discharge planning process, led by the attending physician.  Recommendations may be updated based on patient status, additional functional criteria and insurance authorization.   Follow Up Recommendations  Home health OT    Assistance Recommended at Discharge Set up Supervision/Assistance  Patient can return home with the following A little help with walking and/or transfers;A little help with bathing/dressing/bathroom    Functional Status Assessment  Patient has had a recent decline in their functional status and demonstrates the ability to make significant improvements in function in a reasonable and predictable  amount of time.  Equipment Recommendations  BSC/3in1    Recommendations for Other Services       Precautions / Restrictions Precautions Precautions: Fall Restrictions Weight Bearing Restrictions: No      Mobility Bed Mobility Overal bed mobility: Needs Assistance Bed Mobility: Supine to Sit     Supine to sit: Min guard, HOB elevated     General bed mobility comments: heavy use of rails    Transfers Overall transfer level: Needs assistance Equipment used: Rolling walker (2 wheels) Transfers: Sit to/from Stand Sit to Stand: Min guard, +2 safety/equipment           General transfer comment: MIN A bed height, CGA from regular chair height      Balance Overall balance assessment: Needs assistance Sitting-balance support: Feet supported Sitting balance-Madison Mcbride Scale: Good     Standing balance support: Bilateral upper extremity supported Standing balance-Madison Mcbride Scale: Fair                             ADL either performed or assessed with clinical judgement   ADL Overall ADL's : Needs assistance/impaired                                       General ADL Comments: MAX A don/doff B socks in sitting - reports useing reacher at baseline for LBD, husband assists with shoes. CGA + RW, +2 for lines mgmt.      Pertinent Vitals/Pain Pain Assessment Pain Assessment: No/denies pain     Hand Dominance     Extremity/Trunk Assessment Upper Extremity  Assessment Upper Extremity Assessment: Generalized weakness   Lower Extremity Assessment Lower Extremity Assessment: Generalized weakness   Cervical / Trunk Assessment Cervical / Trunk Assessment: Normal   Communication Communication Communication: No difficulties   Cognition Arousal/Alertness: Awake/alert Behavior During Therapy: WFL for tasks assessed/performed Overall Cognitive Status: Within Functional Limits for tasks assessed                                                   Home Living Family/patient expects to be discharged to:: Private residence Living Arrangements: Spouse/significant other Available Help at Discharge: Family;Available 24 hours/day Type of Home: House Home Access: Stairs to enter [employer] of Steps: 5 Entrance Stairs-Rails: Right;Left;Can reach both Home Layout: One level     Bathroom Shower/Tub: Teacher, early years/pre: Handicapped height Bathroom Accessibility: Yes   Home Equipment: Conservation officer, nature (2 wheels);Shower seat;Wheelchair - manual   Additional Comments: lift chair      Prior Functioning/Environment Prior Level of Function : Independent/Modified Independent             Mobility Comments: rollator at baseline ADLs Comments: pt reports MOD I in ADL, uses a reacher. does not normally wear socks, just slip on shoes        OT Problem List: Decreased strength;Decreased range of motion;Impaired balance (sitting and/or standing);Decreased activity tolerance;Decreased safety awareness      OT Treatment/Interventions: Self-care/ADL training;Therapeutic exercise;Energy conservation;DME and/or AE instruction;Therapeutic activities;Patient/family education;Balance training    OT Goals(Current goals can be found in the care plan section) Acute Rehab OT Goals Patient Stated Goal: to go home OT Goal Formulation: With patient Time For Goal Achievement: 03/21/22 Potential to Achieve Goals: Good ADL Goals Pt Will Perform Grooming: with min guard assist;standing Pt Will Perform Lower Body Dressing: with set-up;with supervision;with adaptive equipment;sit to/from stand Pt Will Transfer to Toilet: with modified independence;ambulating;regular height toilet  OT Frequency: Min 2X/week    Co-evaluation PT/OT/SLP Co-Evaluation/Treatment: Yes Reason for Co-Treatment: For patient/therapist safety;To address functional/ADL transfers PT goals addressed during session: Mobility/safety with  mobility OT goals addressed during session: ADL's and self-care      AM-PAC OT "6 Clicks" Daily Activity     Outcome Measure Help from another person eating meals?: None Help from another person taking care of personal grooming?: A Little Help from another person toileting, which includes using toliet, bedpan, or urinal?: A Little Help from another person bathing (including washing, rinsing, drying)?: A Lot Help from another person to put on and taking off regular upper body clothing?: A Little Help from another person to put on and taking off regular lower body clothing?: A Lot 6 Click Score: 17   End of Session Nurse Communication: Mobility status  Activity Tolerance: Patient tolerated treatment well Patient left: in chair;with call bell/phone within reach;with nursing/sitter in room  OT Visit Diagnosis: Muscle weakness (generalized) (M62.81);Other abnormalities of gait and mobility (R26.89)                Time: 5974-1638 OT Time Calculation (min): 28 min Charges:  OT General Charges $OT Visit: 1 Visit OT Evaluation $OT Eval Moderate Complexity: 1 Mod  Dessie Coma, M.S. OTR/L  03/07/22, 11:55 AM  ascom [phone]

## 2022-03-07 NOTE — TOC Progression Note (Signed)
Transition of Care [facility]) - Progression Note    Patient Details  Name: Madison Mcbride MRN: [id number] Date of Birth: 1955/11/20  Transition of Care [facility]) CM/SW [place], LCSW Phone Number: 03/07/2022, 4:56 PM  Clinical Narrative:    [facility] accepted patient for [facility].   Expected Discharge Plan: [place] Barriers to Discharge: Continued Medical Work up  Expected Discharge Plan and Services Expected Discharge Plan: [place]   Discharge Planning Services: CM Consult Post Acute Care Choice: [place] arrangements for the past 2 months: Single Family Home                 DME Arranged: N/A DME Agency: NA       HH Arranged: RN, PT, OT HH Agency: [place] Date [place]: 03/07/22 Time [place]: 1624     Social Determinants of Health (SDOH) Interventions    Readmission Risk Interventions    03/07/2022    4:12 PM  Readmission Risk Prevention Plan  Transportation Screening Complete  PCP or Specialist Appt within 5-7 Days Complete  Home Care Screening Complete  Medication Review (RN CM) Complete

## 2022-03-07 NOTE — TOC Initial Note (Signed)
Transition of Care [facility]) - Initial/Assessment Note    Patient Details  Name: Madison Mcbride MRN: [id number] Date of Birth: 25-Jul-1955  Transition of Care [facility]) CM/SW Contact:    Shelbie Hutching, RN Phone Number: 03/07/2022, 4:25 PM  Clinical Narrative:                 Patient admitted to the hospital with acute on chronic respiratory failure, patient has a history of COPD and CHF, she has a trilogy at home that she uses at night.  Patient has oxygen at home but does not use it all the time, equipment comes from [employer].  Patient walks with a rollator, she also has a wheelchair. Patient is from home with  her husband.  She is able to drive.  Patient has had home health in the past with [facility], she agrees to [facility] services again at discharge.  RNCM reached out to [facility] and gave referral for [facility] RN, PT, and OT.    Husband will provide transportation home at discharge.   Expected Discharge Plan: [place] Barriers to Discharge: Continued Medical Work up   Patient Goals and CMS Choice Patient states their goals for this hospitalization and ongoing recovery are:: to get back home CMS Medicare.gov Compare Post Acute Care list provided to:: Patient Choice offered to / list presented to : Patient, Spouse  Expected Discharge Plan and Services Expected Discharge Plan: [place]   Discharge Planning Services: CM Consult Post Acute Care Choice: [place] arrangements for the past 2 months: Single Family Home                 DME Arranged: N/A DME Agency: NA       HH Arranged: RN, PT, OT HH Agency: [place] Date [place]: 03/07/22 Time [place] Contacted: 1624    Prior Living Arrangements/Services Living arrangements for the past 2 months: Single Family Home Lives with:: Spouse Patient language and need for interpreter reviewed:: Yes Do you feel safe going back to the place where you live?: Yes       Need for Family Participation in Patient Care: Yes (Comment) Care giver support system in place?: Yes (comment) Current home services: DME (walker, wheelchair, oxygen, trilogy) Criminal Activity/Legal Involvement Pertinent to Current Situation/Hospitalization: No - Comment as needed  Activities of Daily Living Home Assistive Devices/Equipment: CBG Meter, CPAP, Eyeglasses, Walker (specify type) ADL Screening (condition at time of admission) Patient's cognitive ability adequate to safely complete daily activities?: Yes Is the patient deaf or have difficulty hearing?: No Does the patient have difficulty seeing, even when wearing glasses/contacts?: Yes Does the patient have difficulty concentrating, remembering, or making decisions?: No Patient able to express need for assistance with ADLs?: Yes Does the patient have difficulty dressing or bathing?: No Independently performs ADLs?: Yes (appropriate for developmental age) Does the patient have difficulty walking or climbing stairs?: Yes Weakness of Legs: Both Weakness of Arms/Hands: None  Permission Sought/Granted Permission sought to share information with : Case Manager, Family Supports, Other (comment) Permission granted to share information with : Yes, Verbal Permission Granted  Share Information with NAME: Echo Propp  Permission granted to share info w AGENCY: [facility]  Permission granted to share info w Relationship: husband  Permission granted to share info w Contact Information: [phone]  Emotional Assessment Appearance:: Appears stated age Attitude/Demeanor/Rapport: Engaged Affect (typically observed): Accepting Orientation: : Oriented to Self, Oriented to Place, Oriented  to Situation Alcohol / Substance Use: Not Applicable Psych Involvement: No (comment)  Admission diagnosis:  Acute on chronic respiratory failure with hypoxia and hypercapnia (HCC) [C62.37, J96.22] Patient Active Problem List   Diagnosis Date  Noted   Acute on chronic respiratory failure with hypoxia and hypercapnia (HCC) 03/05/2022   Acute exacerbation of CHF (congestive heart failure) ([place]) 07/15/2021   Effusion of knee joint, left 03/22/2021   Acute on chronic systolic CHF (congestive heart failure) ([place]) 03/19/2021   Acute on chronic heart failure ([place]) 03/14/2021   Acute hypoxemic respiratory failure ([place]) 03/14/2021   Acute on chronic diastolic CHF (congestive heart failure) ([place]) 04/09/2020   Diabetes mellitus without complication ([place]) 62/83/1517   HLD (hyperlipidemia) 04/09/2020   Depression 04/09/2020   Elevated troponin 04/09/2020   OSA (obstructive sleep apnea) 04/09/2020   Peripheral polyneuropathy 02/29/2020   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   Lumbar radiculopathy, acute    Acute midline low back pain without sciatica    Type 2 diabetes mellitus with diabetic neuropathy, without long-term current use of insulin ([place]) 02/21/2020   Hypothyroidism 02/21/2020   Morbid obesity with BMI of 50.0-59.9, adult ([place]) 02/21/2020   Severe sepsis ([place]) 02/21/2020   CAP (community acquired pneumonia) 02/21/2020   Bilateral cellulitis of lower leg 02/21/2020   Acute respiratory failure with hypoxia and hypercapnia ([place]) 02/21/2020   Acute renal failure superimposed on stage 3a chronic kidney disease ([place]) 61/60/7371   Acute metabolic encephalopathy 01/06/9484   Septic shock ([place]) 02/21/2020   Stage 3a chronic kidney disease ([place]) 01/27/2020   Transaminitis 01/27/2020   Lymphedema of both lower extremities 08/21/2016   Benign essential hypertension 05/18/2016   PCP:  Rusty Aus, MD Pharmacy:   [id number]: [phone]: [phone]    Social Determinants of Health (SDOH) Interventions    Readmission Risk Interventions    03/07/2022    4:12 PM  Readmission Risk Prevention  Plan  Transportation Screening Complete  PCP or Specialist Appt within 5-7 Days Complete  Home Care Screening Complete  Medication Review (RN CM) Complete

## 2022-03-07 NOTE — Progress Notes (Signed)
PHARMACY CONSULT NOTE   Pharmacy Consult for Electrolyte Monitoring and Replacement   Recent Labs: Potassium (mmol/L)  Date Value  03/07/2022 3.6  01/18/2014 3.7   Magnesium (mg/dL)  Date Value  03/07/2022 2.2   Calcium (mg/dL)  Date Value  03/07/2022 8.3 (L)   Calcium, Total (mg/dL)  Date Value  01/18/2014 8.2 (L)   Albumin (g/dL)  Date Value  03/05/2022 3.1 (L)  01/17/2014 2.6 (L)   Phosphorus (mg/dL)  Date Value  03/07/2022 4.6   Sodium (mmol/L)  Date Value  03/07/2022 142  01/18/2014 138    Assessment: 66 y.o. female w/ PMH of CHF, OSA on CPAP, HLD, T2DM, Hypothyroidism, Afib, RLS, Thyroid cancer and chronic Lymphedema, RLS admitted on 03/05/2022 with sepsis secondary to wound infection. Pharmacy is asked to follow and replace electrolytes while in CCU  Diuretics: furosemide 20 mg IV BID  Goal of Therapy:  Electrolytes WNL  Plan:  KCL 20 mEq x 1  Recheck electrolytes in am  Oswald Hillock ,PharmD, BCPS Clinical Pharmacist 03/07/2022 7:53 AM

## 2022-03-07 NOTE — Plan of Care (Signed)
Continuing with plan of care. 

## 2022-03-08 DIAGNOSIS — E876 Hypokalemia: Secondary | ICD-10-CM | POA: Clinically undetermined

## 2022-03-08 DIAGNOSIS — I480 Paroxysmal atrial fibrillation: Secondary | ICD-10-CM | POA: Diagnosis present

## 2022-03-08 DIAGNOSIS — I5023 Acute on chronic systolic (congestive) heart failure: Secondary | ICD-10-CM

## 2022-03-08 LAB — BLOOD GAS, ARTERIAL
Acid-Base Excess: 12.1 mmol/L — ABNORMAL HIGH (ref 0.0–2.0)
Bicarbonate: 40 mmol/L — ABNORMAL HIGH (ref 20.0–28.0)
FIO2: 0.32 %
O2 Saturation: 95.5 %
Patient temperature: 37
pCO2 arterial: 66 mmHg (ref 32–48)
pH, Arterial: 7.39 (ref 7.35–7.45)
pO2, Arterial: 73 mmHg — ABNORMAL LOW (ref 83–108)

## 2022-03-08 LAB — BASIC METABOLIC PANEL
Anion gap: 7 (ref 5–15)
BUN: 42 mg/dL — ABNORMAL HIGH (ref 8–23)
CO2: 36 mmol/L — ABNORMAL HIGH (ref 22–32)
Calcium: 8.5 mg/dL — ABNORMAL LOW (ref 8.9–10.3)
Chloride: 100 mmol/L (ref 98–111)
Creatinine, Ser: 1.28 mg/dL — ABNORMAL HIGH (ref 0.44–1.00)
GFR, Estimated: 46 mL/min — ABNORMAL LOW (ref >60)
Glucose, Bld: 108 mg/dL — ABNORMAL HIGH (ref 70–99)
Potassium: 3.4 mmol/L — ABNORMAL LOW (ref 3.5–5.1)
Sodium: 143 mmol/L (ref 135–145)

## 2022-03-08 LAB — URINE CULTURE: Culture: NO GROWTH

## 2022-03-08 LAB — GLUCOSE, CAPILLARY
Glucose-Capillary: 89 mg/dL (ref 70–99)
Glucose-Capillary: 83 mg/dL (ref 70–99)
Glucose-Capillary: 295 mg/dL — ABNORMAL HIGH (ref 70–99)
Glucose-Capillary: 120 mg/dL — ABNORMAL HIGH (ref 70–99)
Glucose-Capillary: 142 mg/dL — ABNORMAL HIGH (ref 70–99)
Glucose-Capillary: 141 mg/dL — ABNORMAL HIGH (ref 70–99)

## 2022-03-08 LAB — CBC
HCT: 34 % — ABNORMAL LOW (ref 36.0–46.0)
Hemoglobin: 9.3 g/dL — ABNORMAL LOW (ref 12.0–15.0)
MCH: 23.4 pg — ABNORMAL LOW (ref 26.0–34.0)
MCHC: 27.4 g/dL — ABNORMAL LOW (ref 30.0–36.0)
MCV: 85.4 fL (ref 80.0–100.0)
Platelets: 265 10*3/uL (ref 150–400)
RBC: 3.98 MIL/uL (ref 3.87–5.11)
RDW: 19.4 % — ABNORMAL HIGH (ref 11.5–15.5)
WBC: 10.5 10*3/uL (ref 4.0–10.5)
nRBC: 0 % (ref 0.0–0.2)

## 2022-03-08 LAB — PROCALCITONIN: Procalcitonin: 0.1 ng/mL

## 2022-03-08 LAB — AMMONIA: Ammonia: 25 umol/L (ref 9–35)

## 2022-03-08 LAB — PHOSPHORUS: Phosphorus: 3.4 mg/dL (ref 2.5–4.6)

## 2022-03-08 LAB — MAGNESIUM: Magnesium: 2.2 mg/dL (ref 1.7–2.4)

## 2022-03-08 MED ORDER — ALPRAZOLAM 0.25 MG PO TABS
0.2500 mg | ORAL_TABLET | Freq: Three times a day (TID) | ORAL | Status: DC | PRN
  Administered 2022-03-08 – 2022-03-18 (×4): 0.25 mg via ORAL
  Filled 2022-03-08 (×4): qty 1

## 2022-03-08 MED ORDER — ACETAMINOPHEN 325 MG PO TABS
650.0000 mg | ORAL_TABLET | Freq: Four times a day (QID) | ORAL | Status: DC | PRN
  Administered 2022-03-08 – 2022-03-10 (×4): 650 mg via ORAL
  Filled 2022-03-08 (×4): qty 2

## 2022-03-08 MED ORDER — LIDOCAINE 5 % EX PTCH
1.0000 | MEDICATED_PATCH | Freq: Every day | CUTANEOUS | Status: DC
  Administered 2022-03-08 – 2022-03-09 (×2): 1 via TRANSDERMAL
  Filled 2022-03-08 (×4): qty 1

## 2022-03-08 MED ORDER — SODIUM CHLORIDE 0.9 % IV SOLN
2.0000 g | Freq: Every day | INTRAVENOUS | Status: DC
  Administered 2022-03-08 – 2022-03-11 (×4): 2 g via INTRAVENOUS
  Filled 2022-03-08: qty 2
  Filled 2022-03-08: qty 20
  Filled 2022-03-08 (×2): qty 2

## 2022-03-08 MED ORDER — POTASSIUM CHLORIDE CRYS ER 20 MEQ PO TBCR
40.0000 meq | EXTENDED_RELEASE_TABLET | Freq: Once | ORAL | Status: DC
Start: 2022-03-08 — End: 2022-03-08
  Filled 2022-03-08: qty 2

## 2022-03-08 MED ORDER — POTASSIUM CHLORIDE 20 MEQ PO PACK
40.0000 meq | PACK | Freq: Once | ORAL | Status: AC
  Administered 2022-03-08: 40 meq via ORAL
  Filled 2022-03-08: qty 2

## 2022-03-08 NOTE — Assessment & Plan Note (Signed)
Body mass index is 58.31 kg/m. Complicates overall care and prognosis.

## 2022-03-08 NOTE — Assessment & Plan Note (Addendum)
At baseline, patient uses trilogy NIV at home.  Presented with worsening shortness of breath and hypoxia requiring BiPAP.  Supplemental O2 to maintain sats 88 to 93%.  In review of patient's vitals, oxygen levels in the last day have required 4 to 5 L, but oxygen saturations have been higher than needed, greater than 95% which may be precipitating cause for acute respiratory failure and hypercarbia currently.  Patient changed over to BiPAP.  Able to be weaned off of BiPAP during the day by 8/30.  PCO2 notes improvement and patient breathing more comfortably.  Patient on 4 L which she needs with ambulation.  This may be her new baseline.  Cleared by pulmonary.

## 2022-03-08 NOTE — Assessment & Plan Note (Signed)
Continue sliding scale NovoLog

## 2022-03-08 NOTE — Progress Notes (Signed)
PHARMACY CONSULT NOTE   Pharmacy Consult for Electrolyte Monitoring and Replacement   Recent Labs: Potassium (mmol/L)  Date Value  03/08/2022 3.4 (L)  01/18/2014 3.7   Magnesium (mg/dL)  Date Value  03/08/2022 2.2   Calcium (mg/dL)  Date Value  03/08/2022 8.5 (L)   Calcium, Total (mg/dL)  Date Value  01/18/2014 8.2 (L)   Albumin (g/dL)  Date Value  03/05/2022 3.1 (L)  01/17/2014 2.6 (L)   Phosphorus (mg/dL)  Date Value  03/08/2022 3.4   Sodium (mmol/L)  Date Value  03/08/2022 143  01/18/2014 138    Assessment: 66 y.o. female w/ PMH of CHF, OSA on CPAP, HLD, T2DM, Hypothyroidism, Afib, RLS, Thyroid cancer and chronic Lymphedema, RLS admitted on 03/05/2022 with sepsis secondary to wound infection. Pharmacy is asked to follow and replace electrolytes while in CCU  Diuretics: furosemide 20 mg IV BID  Goal of Therapy:  Electrolytes WNL  Plan:  KCL 40 mEq x 1 - ordered by medical team  Recheck electrolytes in am  Oswald Hillock ,PharmD, BCPS Clinical Pharmacist 03/08/2022 9:42 AM

## 2022-03-08 NOTE — Assessment & Plan Note (Addendum)
.  Patient met SIRS criteria on admission with leukocytosis and tachypnea in setting of suspected UTI versus cellulitis.  Unclear if shock is cardiogenic or septic in nature, cannot rule out septic shock but clinically undetermined.  Sepsis itself stabilized. -- Sepsis physiology improved which was de-escalated to Rocephin and completed antibiotic course.  Procalcitonin and white blood cell count have improved.  Recheck labs in the morning.

## 2022-03-08 NOTE — Assessment & Plan Note (Signed)
Mild and flat trend.  Likely demand ischemia in the setting of decompensated CHF and shock.  Patient is chest pain-free and EKG non-acute.

## 2022-03-08 NOTE — Assessment & Plan Note (Signed)
Continue Synthroid °

## 2022-03-08 NOTE — Plan of Care (Signed)
Continuing with plan of care. 

## 2022-03-08 NOTE — Progress Notes (Signed)
RT drew blood gas earlier this shift, patient tolerated well.  Dr. [place] Ped at bedside when RT gave results.  Will continue to monitor patient.

## 2022-03-08 NOTE — Assessment & Plan Note (Signed)
POA, likely due to hypoxia and infection.  Hypercarbia may contribute but pH is compensated.  Ammonia level normal.  No focal neurologic deficits.   ICU environment contributes. -- Delirium precautions -- Manage underlying conditions as outlined -- Neurochecks -- Avoid sedating medications   8/28: husband present, feels mental status at baseline, but more sleepy than usual

## 2022-03-08 NOTE — Progress Notes (Signed)
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    Admitted on 03/06/2022 to ICU with Acute Hypoxic Hypercapnic Respiratory Failure secondary to CHF decompensation requiring BiPAP, along with severe Sepsis with septic shock in setting of UTI vs ?cellulitis of bilateral lower extremities vs stasis dermatitis due to peripheral edema.  She has open superficial wound on each lower leg from fluid weeping which formed blisters.    8/27 TRH assumed care. Patient off pressors.  O2 sats stable on 3 L/min O2 by [place].  Having some confusion likely due to infection and ICU environment.  Repeat ABG with stable pCO 66, pH is compensated.    Assessment and Plan: * Acute on chronic systolic CHF (congestive heart failure) ([place]) Presented with progressive dyspnea, hypoxia, lower extremity edema, BNP 955 and CXR with cardiomegaly and central vascular congestion with mild interstitial edema. --Diuresis initially had to be held due to hypotension and need for pressors. --Started on IV Lasix 20 mg BID once BP improved --Monitor renal function and electrolytes -- Strict I/O's and daily weights -- ARB, Coreg, on hold for now -- Home torsemide and metolazone on hold  Acute on chronic respiratory failure with hypoxia and hypercapnia (HCC) At baseline, patient uses trilogy NIV at home.  Presented with worsening shortness of breath and hypoxia requiring BiPAP.   -- BiPAP as needed -- Supplemental O2 to maintain sats 88 to 92% -- Repeat CXR, ABG as needed -- Continue Pulmicort nebs, bronchodilators -- Diuresis as outlined -- Pulmonary hygiene -- Mobilize as tolerated, PT/OT  Shock ([place]) Septic versus cardiogenic shock  -resolved. Briefly required pressors on admission to ICU. Now off pressors. -- Monitor BP closely -- Treat CHF and infection as outlined -- Maintain MAP over 65 -- Home Coreg, torsemide and ARB are on hold, resume when BP will tolerate  Severe sepsis [facility]) Patient met SIRS criteria on admission with leukocytosis and tachypnea in setting of suspected UTI versus cellulitis.  Unclear if shock is cardiogenic or septic in nature, cannot rule out septic shock but clinically undetermined. -- Sepsis physiology improved -- Continue antibiotics, de-escalate to Rocephin -- Follow cultures -- Monitor fever curve, CBC, hemodynamics closely  Lymphedema of both lower extremities With increased swelling to the point of weeping and blister formation from fluid, erythema bilaterally consistent with stasis dermatitis.   This is unlikely bilateral cellulitis but due to inflammation from edema.   Will continue antibiotic coverage for open skin wounds until edema is improved. -- De-escalate antibiotics to Rocephin -- Continue diuresis  Acute metabolic encephalopathy POA, likely due to hypoxia and infection.  Hypercarbia may contribute but pH is compensated.  Ammonia level normal.  No focal neurologic deficits.   ICU environment contributes. -- Delirium precautions -- Manage underlying conditions as outlined -- Neurochecks -- Avoid sedating medications   Hypokalemia Replacing oral KCl for K3.4 this morning.  Monitor BMP, replace K as needed.  PAF (paroxysmal atrial fibrillation) (HCC) Continue Eliquis.  Coreg on hold until BP improves.  Monitor on telemetry.  Stage 3a chronic kidney disease (HCC) AKI superimposed on CKD IIIa Presented with creatinine 1.97 in the setting of decompensated CHF and volume overload, probable infection, likely prerenal AKI. Baseline creatinine 7 months  ago was 1.00.  Renal function improving with diuresis. Cr today 1.28. -- Monitor BMP -- Continue diuresis -- Avoid  nephrotoxins, renally dose meds  Benign essential hypertension Presented in shock requiring vasopressors.  BP has stabilized but home antihypertensives are still on hold.  Resume when BP will tolerate.  OSA (obstructive sleep apnea) Uses trilogy/NIV at home.   BiPAP as needed.  Elevated troponin Mild and flat trend.  Likely demand ischemia in the setting of decompensated CHF and shock.  Patient is chest pain-free and EKG non-acute.  HLD (hyperlipidemia) Continue statin  Morbid obesity with BMI of 50.0-59.9, adult (HCC) Body mass index is 58.31 kg/m. Complicates overall care and prognosis.  Hypothyroidism Continue Synthroid  Type 2 diabetes mellitus with diabetic neuropathy, without long-term current use of insulin (HCC) Continue sliding scale NovoLog        Subjective: Patient seen in ICU this morning with RN at bedside.  Patient's mental status remains altered but she seems only mildly confused during my encounter.  She reports leg swelling has improved.  Per RN, patient has been restless and fidgety, was pulling by off BiPAP mask.  Now on nasal cannula.     Physical Exam: Vitals:   03/08/22 1350 03/08/22 1400 03/08/22 1500 03/08/22 1600  BP: 116/63 118/60    Pulse: 66 (!) 115 66 66  Resp: 15 (!) 22 (!) 23 (!) 22  Temp: 99.1 F (37.3 C)   98.9 F (37.2 C)  TempSrc: Oral   Oral  SpO2: 94% 100% 95% 98%  Weight:      Height:       General exam: awake, alert,, mildly confused, obese no acute distress HEENT: Nasal cannula in place, moist mucus membranes, hearing grossly normal  Respiratory system: CTAB but diminished due to body habitus, no wheezes, normal respiratory effort at rest, on 3 L/min [place] O2. Cardiovascular system: normal S1/S2, RRR, unable to visualize JVD, improving lower extremity edema as evidenced by skin wrinkling edema.   Gastrointestinal system: soft, NT, ND, no HSM felt, +bowel sounds. Central nervous system: A&O x3. no gross focal neurologic  deficits, normal speech Extremities: Bilateral lower extremities with erythema that appears to be fading and receding, bilateral skin tear wounds with dressings intact.  No weeping from the lower extremities seen Skin: dry, intact, normal temperature, erythematous distal lower extremities as above Psychiatry: normal mood, congruent affect, mildly confused with abnormal judgment and insight related to delirium   Data Reviewed:  Notable labs:   Repeat blood gas this morning pH 7.39, PCO2 66 stable.  BMP with K3.4, bicarb 36, creatinine improved 1.28.  Procalcitonin 0.10, hemoglobin 9.3 otherwise normal CBC  Family Communication:    Disposition: Status is: Inpatient Remains inpatient appropriate because: Severity of illness remaining on IV diuresis and IV antibiotics.   Planned Discharge Destination: Home with Home Health    Time spent: 45 minutes  Author: Ezekiel Slocumb, DO 03/08/2022 4:43 PM  For on call review [email or web address].

## 2022-03-08 NOTE — Assessment & Plan Note (Addendum)
AKI superimposed on CKD IIIa Presented with creatinine 1.97 in the setting of decompensated CHF and volume overload, probable infection, likely prerenal AKI.  Currently better than baseline, indicating still room to diurese.  Continue to monitor labs.

## 2022-03-08 NOTE — Assessment & Plan Note (Signed)
Continue Eliquis.  Coreg on hold until BP improves.  Monitor on telemetry.

## 2022-03-08 NOTE — Assessment & Plan Note (Signed)
Continue statin. 

## 2022-03-08 NOTE — Assessment & Plan Note (Addendum)
With increased swelling to the point of weeping and blister formation from fluid, erythema bilaterally consistent with stasis dermatitis.   This is unlikely bilateral cellulitis but due to inflammation from edema.   Will continue antibiotic coverage for open skin wounds until edema is improved. -- De-escalate antibiotics to Rocephin -- Continue diuresis

## 2022-03-08 NOTE — Assessment & Plan Note (Signed)
Presented in shock requiring vasopressors.  BP has stabilized but home antihypertensives are still on hold.  Resume when BP will tolerate.

## 2022-03-08 NOTE — Hospital Course (Addendum)
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   Patient was admitted to ICU with Acute Hypoxic Hypercapnic Respiratory Failure secondary to CHF decompensation requiring BiPAP, along with severe Sepsis with septic shock in setting of UTI vs ?cellulitis of bilateral lower extremities vs stasis dermatitis due to peripheral edema.  She has open superficial wound on each lower leg from fluid weeping which formed blisters.  Stabilized and patient transferred to hospitalist service on 8/27.    Since then, patient has been receiving diuresis over 6.5 L.  Over the past 2 days, patient's oxygen requirement slightly increased.  And on 8/29, patient noted to have low-grade fever.  Had episode of brief tachycardia and ABG done noted worsening hypercarbia and hypoxia.  Patient placed on BiPAP.  By 8/30, doing well and able to maintain on 3-4 L nasal cannula.

## 2022-03-08 NOTE — Progress Notes (Signed)
So far this shift patient has been noted to talk incoherently and continues to take off her oxygen resulting in her oxygen saturation dropping to the mid 70's and dusky in color.  Upon placing oxygen back on, patient required an increase to 5L to regain oxygen saturation levels to the low 90s.  Reached out to Dr. Arbutus Ped who ordered ABGs and xanax for restlessness.  Will continue to monitor patient.

## 2022-03-08 NOTE — Assessment & Plan Note (Signed)
Septic versus cardiogenic shock -resolved. Briefly required pressors on admission to ICU. Now off pressors. -- Monitor BP closely -- Treat CHF and infection as outlined -- Maintain MAP over 65 -- Home Coreg, torsemide and ARB are on hold, resume when BP will tolerate

## 2022-03-08 NOTE — Assessment & Plan Note (Addendum)
Uses trilogy/NIV at home.  We will make sure in her discharge instructions that she will continue to use her home trilogy machine at skilled nursing. While here in the hospital, BiPAP at night

## 2022-03-08 NOTE — Assessment & Plan Note (Addendum)
Presented with progressive dyspnea, hypoxia, lower extremity edema, BNP 955 and CXR with cardiomegaly and central vascular congestion with mild interstitial edema.  Initially diuretics held due to hypotension and shock. Lasix started once patient's blood pressure stabilized.  Lasix increased to 40 twice daily on 8/29.  Also with Aldactone.  She has since diuresed almost 12 L and is more than -8.5 L deficient.   Home torsemide on hold.  Given morbid obesity, BNP is likely significantly likely underestimating degree of heart failure.  BNP still elevated at 630 and patient with 147 kg, was 140 kg at an office visit at the beginning of the month.  Continue IV Lasix.

## 2022-03-08 NOTE — Assessment & Plan Note (Addendum)
Replacing oral KCl for K3.3 this morning.  Monitor BMP, replace K as needed.

## 2022-03-09 DIAGNOSIS — I5023 Acute on chronic systolic (congestive) heart failure: Secondary | ICD-10-CM | POA: Diagnosis not present

## 2022-03-09 LAB — GLUCOSE, CAPILLARY
Glucose-Capillary: 111 mg/dL — ABNORMAL HIGH (ref 70–99)
Glucose-Capillary: 113 mg/dL — ABNORMAL HIGH (ref 70–99)
Glucose-Capillary: 147 mg/dL — ABNORMAL HIGH (ref 70–99)
Glucose-Capillary: 109 mg/dL — ABNORMAL HIGH (ref 70–99)
Glucose-Capillary: 116 mg/dL — ABNORMAL HIGH (ref 70–99)
Glucose-Capillary: 145 mg/dL — ABNORMAL HIGH (ref 70–99)

## 2022-03-09 LAB — BASIC METABOLIC PANEL
Anion gap: 8 (ref 5–15)
BUN: 31 mg/dL — ABNORMAL HIGH (ref 8–23)
CO2: 37 mmol/L — ABNORMAL HIGH (ref 22–32)
Calcium: 8.4 mg/dL — ABNORMAL LOW (ref 8.9–10.3)
Chloride: 98 mmol/L (ref 98–111)
Creatinine, Ser: 1.03 mg/dL — ABNORMAL HIGH (ref 0.44–1.00)
GFR, Estimated: 60 mL/min — ABNORMAL LOW (ref >60)
Glucose, Bld: 113 mg/dL — ABNORMAL HIGH (ref 70–99)
Potassium: 3.3 mmol/L — ABNORMAL LOW (ref 3.5–5.1)
Sodium: 143 mmol/L (ref 135–145)

## 2022-03-09 LAB — CBC
HCT: 32.4 % — ABNORMAL LOW (ref 36.0–46.0)
Hemoglobin: 8.9 g/dL — ABNORMAL LOW (ref 12.0–15.0)
MCH: 23.7 pg — ABNORMAL LOW (ref 26.0–34.0)
MCHC: 27.5 g/dL — ABNORMAL LOW (ref 30.0–36.0)
MCV: 86.4 fL (ref 80.0–100.0)
Platelets: 240 10*3/uL (ref 150–400)
RBC: 3.75 MIL/uL — ABNORMAL LOW (ref 3.87–5.11)
RDW: 19.2 % — ABNORMAL HIGH (ref 11.5–15.5)
WBC: 10.7 10*3/uL — ABNORMAL HIGH (ref 4.0–10.5)
nRBC: 0 % (ref 0.0–0.2)

## 2022-03-09 LAB — MAGNESIUM: Magnesium: 2.2 mg/dL (ref 1.7–2.4)

## 2022-03-09 LAB — LEGIONELLA PNEUMOPHILA SEROGP 1 UR AG: L. pneumophila Serogp 1 Ur Ag: NEGATIVE

## 2022-03-09 MED ORDER — POTASSIUM CHLORIDE 20 MEQ PO PACK
60.0000 meq | PACK | Freq: Once | ORAL | Status: AC
Start: 2022-03-09 — End: 2022-03-09
  Administered 2022-03-09: 60 meq via ORAL
  Filled 2022-03-09: qty 3

## 2022-03-09 MED ORDER — KETOROLAC TROMETHAMINE 15 MG/ML IJ SOLN
15.0000 mg | Freq: Once | INTRAMUSCULAR | Status: AC
  Administered 2022-03-09: 15 mg via INTRAVENOUS
  Filled 2022-03-09: qty 1

## 2022-03-09 NOTE — Progress Notes (Signed)
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    Admitted on 03/06/2022 to ICU with Acute Hypoxic Hypercapnic Respiratory Failure secondary to CHF decompensation requiring BiPAP, along with severe Sepsis with septic shock in setting of UTI vs ?cellulitis of bilateral lower extremities vs stasis dermatitis due to peripheral edema.  She has open superficial wound on each lower leg from fluid weeping which formed blisters.    8/27 TRH assumed care. Patient off pressors.  O2 sats stable on 3 L/min O2 by [place].  Having some confusion likely due to infection and ICU environment.  Repeat ABG with stable pCO 66, pH is compensated.   Assessment and Plan: * Acute on chronic systolic CHF (congestive heart failure) ([place]) Presented with progressive dyspnea, hypoxia, lower extremity edema, BNP 955 and CXR with cardiomegaly and central vascular congestion with mild interstitial edema. --Diuresis initially had to be held due to hypotension and need for pressors. --Started on IV Lasix 20 mg BID once BP improved --Monitor renal function and electrolytes -- Strict I/O's and daily weights -- ARB, Coreg, on hold for now -- Home torsemide and metolazone on hold  8/28: renal function continues to improve, continue IV diuresis  Acute on chronic respiratory failure with hypoxia and hypercapnia (HCC) At baseline, patient uses trilogy NIV at home.  Presented with worsening shortness of breath and hypoxia requiring BiPAP.   -- BiPAP as needed -- Supplemental O2 to maintain sats 88 to 93% -- Repeat CXR, ABG as needed -- Continue Pulmicort nebs, bronchodilators -- Diuresis as outlined -- Pulmonary hygiene -- Mobilize as tolerated,  PT/OT  8/28: still needing 4-5 L/min O2  Shock ([place]) Septic versus cardiogenic shock -resolved. Briefly required pressors on admission to ICU. Now off pressors. -- Monitor BP closely -- Treat CHF and infection as outlined -- Maintain MAP over 65 -- Home Coreg, torsemide and ARB are on hold, resume when BP will tolerate  Severe sepsis [facility]) Patient met SIRS criteria on admission with leukocytosis and tachypnea in setting of suspected UTI versus cellulitis.  Unclear if shock is cardiogenic or septic in nature, cannot rule out septic shock but clinically undetermined. -- Sepsis physiology improved -- Continue antibiotics, de-escalate to Rocephin -- Follow cultures -- Monitor fever curve, CBC, hemodynamics closely  Lymphedema of both lower extremities With increased swelling to the point of weeping and blister formation from fluid, erythema bilaterally consistent with stasis dermatitis and lymphedema.  Will continue antibiotic coverage given open skin wounds. -- De-escalated antibiotics to Rocephin -- Continue diuresis  Acute metabolic encephalopathy POA, likely due to hypoxia and infection.  Hypercarbia may contribute but pH is compensated.  Ammonia level normal.  No focal neurologic deficits.   ICU environment contributes. -- Delirium precautions -- Manage underlying conditions as outlined -- Neurochecks -- Avoid sedating medications   8/28: husband present, feels mental status at baseline, but more sleepy than usual  Hypokalemia Replacing oral KCl for K3.3 this morning.  Monitor BMP, replace K as needed.  PAF (paroxysmal atrial fibrillation) (HCC) Continue Eliquis.  Coreg on hold until BP improves.  Monitor on telemetry.  Stage 3a chronic kidney disease ([place]) AKI superimposed on CKD IIIa Presented with creatinine 1.97 in the setting of  decompensated CHF and volume overload, probable infection, likely prerenal AKI. Baseline creatinine 7 months ago was 1.00.  Renal function  improving with diuresis. Cr today 1.28. -- Monitor BMP -- Continue diuresis -- Avoid nephrotoxins, renally dose meds  Benign essential hypertension Presented in shock requiring vasopressors.  BP has stabilized but home antihypertensives are still on hold.  Resume when BP will tolerate.  OSA (obstructive sleep apnea) Uses trilogy/NIV at home.   BiPAP as needed.  Elevated troponin Mild and flat trend.  Likely demand ischemia in the setting of decompensated CHF and shock.  Patient is chest pain-free and EKG non-acute.  HLD (hyperlipidemia) Continue statin  Morbid obesity with BMI of 50.0-59.9, adult (HCC) Body mass index is 58.31 kg/m. Complicates overall care and prognosis.  Hypothyroidism Continue Synthroid  Type 2 diabetes mellitus with diabetic neuropathy, without long-term current use of insulin (HCC) Continue sliding scale NovoLog        Subjective: Patient seen in ICU this morning with husband at bedside.  She reports feeling a bit better.  Was having pain in the left leg near her wound earlier, says it's feeling better than earlier but still reports the area tender on palpation.  Husband feels her mental status at baseline although she's more sleepy than usual.  Pt denies other acute complaints.    Physical Exam: Vitals:   03/09/22 1200 03/09/22 1406 03/09/22 1653 03/09/22 1931  BP:   130/78   Pulse: 63  73   Resp:   16   Temp: 98.3 F (36.8 C)  98.3 F (36.8 C)   TempSrc: Oral     SpO2: 94% 94% 99% 93%  Weight:      Height:       General exam: awake, appears drowsy, obese no acute distress HEENT: Nasal cannula in place, moist mucus membranes, hearing grossly normal  Respiratory system: CTAB but diminished due to body habitus, no wheezes, normal respiratory effort at rest, on 3 L/min [place] O2. Cardiovascular system: normal S1/S2, RRR, unable to visualize JVD, improving lower extremity edema as evidenced by skin wrinkling edema.   Gastrointestinal system:  soft, NT, ND Central nervous system: A&O x3. no gross focal neurologic deficits, normal speech Extremities: Bilateral lower extremities with erythema that appears to be fading and receding, bilateral skin tear wounds with dressings intact.  No weeping from the lower extremities seen Skin: left leg medially to dressing over anterior wound is tender, erythematous distal lower extremities (improving) Psychiatry: normal mood, congruent affect   Data Reviewed:  Notable labs:   K 3.3, bicarb 37, glucose 113, BUN 31, Cr 1.03, WBC 10.7, Hbg 8.9  Family Communication: husband at bedside on rounds today   Disposition: Status is: Inpatient Remains inpatient appropriate because: Severity of illness remaining on IV diuresis and IV antibiotics.   Planned Discharge Destination: Home with Home Health    Time spent: 45 minutes  Author: Ezekiel Slocumb, DO 03/09/2022 8:45 PM  For on call review [email or web address].

## 2022-03-09 NOTE — Progress Notes (Signed)
       CROSS COVER NOTE  NAME: Rhandi Despain MRN: [id number] DOB : 02-01-1956    Date of Service   03/09/2022   HPI/Events of Note   Medication request received for 8/10 back pain. Pt has lidocaine patch in place and ordered tylenol has been ineffective.  Interventions   Plan: Toradol x1     This document was prepared using Dragon voice recognition software and may include unintentional dictation errors.  Neomia Glass DNP, MHA, FNP-BC Nurse Practitioner [facility] Pager [phone]

## 2022-03-09 NOTE — Progress Notes (Addendum)
PHARMACY CONSULT NOTE   Pharmacy Consult for Electrolyte Monitoring and Replacement   Recent Labs: Potassium (mmol/L)  Date Value  03/09/2022 3.3 (L)  01/18/2014 3.7   Magnesium (mg/dL)  Date Value  03/09/2022 2.2   Calcium (mg/dL)  Date Value  03/09/2022 8.4 (L)   Calcium, Total (mg/dL)  Date Value  01/18/2014 8.2 (L)   Albumin (g/dL)  Date Value  03/05/2022 3.1 (L)  01/17/2014 2.6 (L)   Phosphorus (mg/dL)  Date Value  03/08/2022 3.4   Sodium (mmol/L)  Date Value  03/09/2022 143  01/18/2014 138    Assessment: 66 y.o. female w/ PMH of CHF, OSA on CPAP, HLD, T2DM, Hypothyroidism, Afib, RLS, Thyroid cancer and chronic Lymphedema, RLS admitted on 03/05/2022 with sepsis secondary to wound infection. Pharmacy is asked to follow and replace electrolytes while in CCU  Diuretics: furosemide 20 mg IV BID  Goal of Therapy:  Electrolytes WNL  Plan:  Scr 1.48>1.28>1.03; UOP 0.6-0.53m/k/h [I/O negative 5.4L] K 3.4>3.3: despite KCL 40 mEq x1. Will replace with additional KCL 655m PO x1 Other lytes WNL today, CTM and adjust PRN Recheck electrolytes in am  BrLorna DibblePharmD, BCLenh[facility]villeharmacist 03/09/2022 7:46 AM

## 2022-03-09 NOTE — Progress Notes (Signed)
Physical Therapy Treatment Patient Details Name: Madison Mcbride MRN: [id number] DOB: 1955/07/28 Today's Date: 03/09/2022   History of Present Illness Pt is a 66 yo female that presented to the ED for SOB, weakness. Admitted to ICU for acaute on chronic hypoxic hypercapnic respiratory failure, sepsis due to wound infection.  PMH of COPD, CHF, DM, lymphedema, thyroid cancer, sleep apnea, afib, RLS.    PT Comments    Patient reports having mild pain in her legs and mid back since last therapy session. She was agreeable to in bed exercises for strengthening performed with assistance in bed. No significant change in vitals noted with activity. Patient's lunch arrived and she wanted to remain in bed to eat. Encourage patient to get out of bed to chair routinely with staff assistance for upright conditioning. Anticipate patient can return home with family assistance. PT will continue to follow to maximize independence and decrease caregiver burden.    Recommendations for follow up therapy are one component of a multi-disciplinary discharge planning process, led by the attending physician.  Recommendations may be updated based on patient status, additional functional criteria and insurance authorization.  Follow Up Recommendations  Home health PT     Assistance Recommended at Discharge Frequent or constant Supervision/Assistance  Patient can return home with the following A little help with bathing/dressing/bathroom;A little help with walking and/or transfers;Assistance with cooking/housework;Assist for transportation;Help with stairs or ramp for entrance   Equipment Recommendations  None recommended by PT    Recommendations for Other Services       Precautions / Restrictions Precautions Precautions: Fall Restrictions Weight Bearing Restrictions: No     Mobility  Bed Mobility               General bed mobility comments: patient declined due to wanting to eat lunch.  encouraged patient to sit the head of bed up for meals.    Transfers                        Ambulation/Gait                   Stairs             Wheelchair Mobility    Modified Rankin (Stroke Patients Only)       Balance                                            Cognition Arousal/Alertness: Awake/alert Behavior During Therapy: WFL for tasks assessed/performed Overall Cognitive Status: Within Functional Limits for tasks assessed                                          Exercises General Exercises - Lower Extremity Ankle Circles/Pumps: AROM, Strengthening, Both, 10 reps, Supine Heel Slides: AAROM, Strengthening, Both, 10 reps, Supine Hip ABduction/ADduction: AAROM, Strengthening, Both, 10 reps, Supine Other Exercises Other Exercises: performed LE exercises for strengthening in bed. no change in vitals noted. mild pain is reported with AROM of BLE    General Comments        Pertinent Vitals/Pain Pain Assessment Pain Assessment: Faces Faces Pain Scale: Hurts a little bit Pain Location: bilateral legs with AROM, middle of back Pain Descriptors / Indicators: Sore Pain Intervention(s): Limited activity  within patient's tolerance    Home Living                          Prior Function            PT Goals (current goals can now be found in the care plan section) Acute Rehab PT Goals Patient Stated Goal: to get her strength back PT Goal Formulation: With patient Time For Goal Achievement: 03/21/22 Potential to Achieve Goals: Fair Progress towards PT goals: Progressing toward goals    Frequency    Min 2X/week      PT Plan Current plan remains appropriate    Co-evaluation              AM-PAC PT "6 Clicks" Mobility   Outcome Measure  Help needed turning from your back to your side while in a flat bed without using bedrails?: A Lot Help needed moving from lying on your back to  sitting on the side of a flat bed without using bedrails?: A Lot Help needed moving to and from a bed to a chair (including a wheelchair)?: A Little Help needed standing up from a chair using your arms (e.g., wheelchair or bedside chair)?: A Little Help needed to walk in hospital room?: A Little Help needed climbing 3-5 steps with a railing? : A Little 6 Click Score: 16    End of Session   Activity Tolerance: Patient limited by fatigue Patient left: in bed;with call bell/phone within reach;with family/visitor present (set-up for lunch) Nurse Communication: Mobility status PT Visit Diagnosis: Other abnormalities of gait and mobility (R26.89);Difficulty in walking, not elsewhere classified (R26.2);Muscle weakness (generalized) (M62.81)     Time: 3662-9476 PT Time Calculation (min) (ACUTE ONLY): 15 min  Charges:  $Therapeutic Exercise: 8-22 mins                     Minna Merritts, PT, MPT    Percell Locus 03/09/2022, 2:46 PM

## 2022-03-10 ENCOUNTER — Inpatient Hospital Stay: Payer: Medicare Other

## 2022-03-10 DIAGNOSIS — Z6841 Body Mass Index (BMI) 40.0 and over, adult: Secondary | ICD-10-CM

## 2022-03-10 DIAGNOSIS — J9622 Acute and chronic respiratory failure with hypercapnia: Secondary | ICD-10-CM

## 2022-03-10 DIAGNOSIS — I5023 Acute on chronic systolic (congestive) heart failure: Secondary | ICD-10-CM | POA: Diagnosis not present

## 2022-03-10 DIAGNOSIS — I480 Paroxysmal atrial fibrillation: Secondary | ICD-10-CM

## 2022-03-10 DIAGNOSIS — J9621 Acute and chronic respiratory failure with hypoxia: Secondary | ICD-10-CM | POA: Diagnosis not present

## 2022-03-10 LAB — RESP PANEL BY RT-PCR (FLU A&B, COVID) ARPGX2
Influenza A by PCR: NEGATIVE
Influenza B by PCR: NEGATIVE
SARS Coronavirus 2 by RT PCR: NEGATIVE

## 2022-03-10 LAB — GLUCOSE, CAPILLARY
Glucose-Capillary: 107 mg/dL — ABNORMAL HIGH (ref 70–99)
Glucose-Capillary: 114 mg/dL — ABNORMAL HIGH (ref 70–99)
Glucose-Capillary: 117 mg/dL — ABNORMAL HIGH (ref 70–99)
Glucose-Capillary: 134 mg/dL — ABNORMAL HIGH (ref 70–99)
Glucose-Capillary: 136 mg/dL — ABNORMAL HIGH (ref 70–99)
Glucose-Capillary: 139 mg/dL — ABNORMAL HIGH (ref 70–99)

## 2022-03-10 LAB — CULTURE, BLOOD (ROUTINE X 2)
Culture: NO GROWTH
Culture: NO GROWTH

## 2022-03-10 LAB — PROCALCITONIN: Procalcitonin: 0.22 ng/mL

## 2022-03-10 LAB — BLOOD GAS, ARTERIAL
Acid-Base Excess: 15.5 mmol/L — ABNORMAL HIGH (ref 0.0–2.0)
Bicarbonate: 44.2 mmol/L — ABNORMAL HIGH (ref 20.0–28.0)
O2 Saturation: 99.5 %
Patient temperature: 37
pCO2 arterial: 73 mmHg (ref 32–48)
pH, Arterial: 7.39 (ref 7.35–7.45)
pO2, Arterial: 177 mmHg — ABNORMAL HIGH (ref 83–108)

## 2022-03-10 LAB — CBC
HCT: 32.6 % — ABNORMAL LOW (ref 36.0–46.0)
Hemoglobin: 8.9 g/dL — ABNORMAL LOW (ref 12.0–15.0)
MCH: 23.5 pg — ABNORMAL LOW (ref 26.0–34.0)
MCHC: 27.3 g/dL — ABNORMAL LOW (ref 30.0–36.0)
MCV: 86 fL (ref 80.0–100.0)
Platelets: 267 10*3/uL (ref 150–400)
RBC: 3.79 MIL/uL — ABNORMAL LOW (ref 3.87–5.11)
RDW: 19.3 % — ABNORMAL HIGH (ref 11.5–15.5)
WBC: 12.9 10*3/uL — ABNORMAL HIGH (ref 4.0–10.5)
nRBC: 0 % (ref 0.0–0.2)

## 2022-03-10 LAB — BASIC METABOLIC PANEL
Anion gap: 8 (ref 5–15)
BUN: 25 mg/dL — ABNORMAL HIGH (ref 8–23)
CO2: 37 mmol/L — ABNORMAL HIGH (ref 22–32)
Calcium: 8.6 mg/dL — ABNORMAL LOW (ref 8.9–10.3)
Chloride: 99 mmol/L (ref 98–111)
Creatinine, Ser: 1.01 mg/dL — ABNORMAL HIGH (ref 0.44–1.00)
GFR, Estimated: >60 mL/min (ref >60)
Glucose, Bld: 125 mg/dL — ABNORMAL HIGH (ref 70–99)
Potassium: 3.7 mmol/L (ref 3.5–5.1)
Sodium: 144 mmol/L (ref 135–145)

## 2022-03-10 LAB — PHOSPHORUS: Phosphorus: 2.9 mg/dL (ref 2.5–4.6)

## 2022-03-10 LAB — LACTIC ACID, PLASMA: Lactic Acid, Venous: 1.2 mmol/L (ref 0.5–1.9)

## 2022-03-10 LAB — BRAIN NATRIURETIC PEPTIDE: B Natriuretic Peptide: 838.6 pg/mL — ABNORMAL HIGH (ref 0.0–100.0)

## 2022-03-10 LAB — MAGNESIUM: Magnesium: 2.1 mg/dL (ref 1.7–2.4)

## 2022-03-10 MED ORDER — FUROSEMIDE 10 MG/ML IJ SOLN
40.0000 mg | Freq: Two times a day (BID) | INTRAMUSCULAR | Status: DC
  Administered 2022-03-10 – 2022-03-13 (×7): 40 mg via INTRAVENOUS
  Filled 2022-03-10 (×7): qty 4

## 2022-03-10 MED ORDER — POTASSIUM CHLORIDE 20 MEQ PO PACK
20.0000 meq | PACK | Freq: Once | ORAL | Status: AC
  Administered 2022-03-10: 20 meq via ORAL
  Filled 2022-03-10: qty 1

## 2022-03-10 NOTE — Progress Notes (Signed)
Patient being transferred to Room 231 d/t being on continuous Bi-Pap. Called report to [employer], Therapist, sports.

## 2022-03-10 NOTE — Plan of Care (Signed)
  Problem: Health Behavior/Discharge Planning: Goal: Ability to manage health-related needs will improve Outcome: Progressing   Problem: Clinical Measurements: Goal: Respiratory complications will improve Outcome: Progressing   Problem: Clinical Measurements: Goal: Cardiovascular complication will be avoided Outcome: Progressing   Problem: Elimination: Goal: Will not experience complications related to urinary retention Outcome: Progressing   Problem: Pain Managment: Goal: General experience of comfort will improve Outcome: Progressing   Problem: Safety: Goal: Ability to remain free from injury will improve Outcome: Progressing   

## 2022-03-10 NOTE — Progress Notes (Signed)
Mobility Specialist - Progress Note    03/10/22 1000  Mobility  Activity Stood at bedside;Transferred from bed to chair;Dangled on edge of bed  Level of Assistance +2 (takes two people)  Geographical information systems officer  Distance Ambulated (ft) 3 ft  Activity Response Tolerated well  $Mobility charge 1 Mobility    Pt semi supine upon arrival using 5L. Completes bed mobility ModA for UE and heavy use of rails. SpO2 92-94%, HR 130-146 BPM. STS with ModA +2 --- forward flex posture with forearms resting on RW, unable to fully extend UE despite vc to do so. Pt takes 3 side steps MaxA+2 to transfer B-C with heavy vc and RW steering. Descends to recliner ModA. Blood output noticed on floor during transfer, RN notified. Left with chair alarm set and needs in reach.  Merrily Brittle Mobility Specialist 03/10/22, 10:31 AM

## 2022-03-10 NOTE — Progress Notes (Addendum)
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   Patient was admitted to ICU with Acute Hypoxic Hypercapnic Respiratory Failure secondary to CHF decompensation requiring BiPAP, along with severe Sepsis with septic shock in setting of UTI vs ?cellulitis of bilateral lower extremities vs stasis dermatitis due to peripheral edema.  She has open superficial wound on each lower leg from fluid weeping which formed blisters.  Stabilized and patient transferred to hospitalist service on 8/27.    Since then, patient has been receiving diuresis over 6.5 L.  Over the past 2 days, patient's oxygen requirement slightly increased.  And on 8/29, patient noted to have low-grade fever.  Had episode of brief tachycardia and ABG done noted worsening hypercarbia and hypoxia.  Patient placed on BiPAP.  Assessment and Plan: Assessment and Plan: * Acute on chronic systolic CHF (congestive heart failure) (HCC) Presented with progressive dyspnea, hypoxia, lower extremity edema, BNP 955 and CXR with cardiomegaly and central vascular congestion with mild interstitial edema.  Initially diuretics held due to hypotension and shock.  I Lasix started once patient's blood pressure stabilized and he has since diuresed almost 10 L and is -6.7 L deficient.  BNP still quite a bit elevated, so will increase Lasix to 40 mg twice daily.  Home torsemide and metolazone on hold.  Given morbid obesity, BNP is likely significantly likely underestimating degree of heart failure.  Acute on chronic respiratory failure with hypoxia and hypercapnia (HCC) At baseline, patient uses trilogy NIV at home.  Presented with worsening  shortness of breath and hypoxia requiring BiPAP.  Supplemental O2 to maintain sats 88 to 93%.  In review of patient's vitals, oxygen levels in the last day have required 4 to 5 L, but oxygen saturations have been higher than needed, greater than 95% which may be precipitating cause for acute respiratory failure and hypercarbia currently.  Patient changed over to BiPAP.  Shock (HCC)-resolved as of 03/10/2022 Septic versus cardiogenic shock -resolved. Briefly required pressors on admission to ICU. Now off pressors. -- Monitor BP closely -- Treat CHF and infection as outlined -- Maintain MAP over 65 -- Home Coreg, torsemide and ARB are on hold, resume when BP will tolerate  Severe sepsis (HCC)-resolved as of 03/10/2022 Patient met SIRS criteria on admission with leukocytosis and tachypnea in setting of suspected UTI versus cellulitis.  Unclear if shock is cardiogenic or septic in nature, cannot rule out septic shock but clinically undetermined.  Sepsis itself stabilized. -- Sepsis physiology improved which was de-escalated to Rocephin.  Noted to have low-grade temperature on 8/29 and minimal increase in procalcitonin and white blood cell count.  Would not favor changing antibiotics at this time, rather repeat lab work in the morning.  Lymphedema of both lower extremities With increased swelling to the point of weeping and blister formation from fluid, erythema bilaterally consistent with stasis dermatitis and lymphedema.  Will continue antibiotic coverage given open skin wounds. -- De-escalated antibiotics to Rocephin -- Continue diuresis  Acute metabolic encephalopathy-resolved as of 03/10/2022 POA, likely due to hypoxia and infection.  Hypercarbia may contribute but pH is compensated.  Ammonia level normal.  No focal neurologic deficits.  ICU environment contributes.  Avoiding sedating medications.  Has been feels close to baseline and should improve even further with treatment of worsening  hypercarbia.  PAF (paroxysmal atrial fibrillation) (HCC) Continue Eliquis.  Coreg on hold until BP improves.  Monitor on telemetry.  Had brief episode on 8/29 due to worsening respiratory failure.  Acute kidney injury in the setting of stage 3a chronic kidney disease ([place]) AKI superimposed on CKD IIIa Presented with creatinine 1.97 in the setting of decompensated CHF and volume overload, probable infection, likely prerenal AKI. Baseline creatinine 7 months ago was 1.00.  Currently renal function better than expected at 1.01 with GFR greater than 60  OSA (obstructive sleep apnea) Uses trilogy/NIV at home.   BiPAP as needed.  Elevated troponin Mild and flat trend.  Likely demand ischemia in the setting of decompensated CHF and shock.  Patient is chest pain-free and EKG non-acute.  Type 2 diabetes mellitus with diabetic neuropathy, without long-term current use of insulin (HCC) Continue sliding scale NovoLog  Benign essential hypertension Presented in shock requiring vasopressors.  BP has stabilized but home antihypertensives are still on hold.  Resume when BP will tolerate.  Hypothyroidism Continue Synthroid  HLD (hyperlipidemia) Continue statin  Hypokalemia-resolved as of 03/10/2022 Replacing oral KCl for K3.3 this morning.  Monitor BMP, replace K as needed.  Morbid obesity with BMI of 50.0-59.9, adult (HCC) Body mass index is 58.31 kg/m. Complicates overall care and prognosis.       Body mass index is 57.67 kg/m.        Consultants: Critical care  Procedures: Echocardiogram done 8/25: Preserved diastolic function.  Ejection fraction 45%  Antimicrobials: IV Rocephin 8/27-present IV cefepime and vancomycin 8/25 - 8/26   Code Status: Full code   Subjective: Patient doing okay, breathing a little bit easier with BiPAP  Objective: Noted hypoxia Vitals:   03/10/22 1415 03/10/22 1530  BP:    Pulse:  (!) 113  Resp:  20  Temp:    SpO2: 98% 98%     Intake/Output Summary (Last 24 hours) at 03/10/2022 1629 Last data filed at 03/10/2022 6160 Gross per 24 hour  Intake 100 ml  Output 625 ml  Net -525 ml   Filed Weights   03/08/22 0500 03/09/22 0420 03/10/22 0414  Weight: (!) 154.1 kg (!) 154.1 kg (!) 152.4 kg   Body mass index is 57.67 kg/m.  Exam:  General: Alert and oriented x3, no acute distress HEENT: Currently on BiPAP, narrow airway Cardiovascular: Irregular rhythm, rate controlled Respiratory: Decreased breath sounds throughout secondary to body habitus Abdomen: Soft, obese, nontender, positive bowel sounds Musculoskeletal: No clubbing or cyanosis, trace pitting edema Skin: Skin breaks, tears or lesion Psychiatry: Appropriate, no evidence of psychoses and Neurology: Focal deficits  Data Reviewed: Noted mildly elevated white blood cell count and procalcitonin.  BNP improved from previous day, but only minimally.  Disposition:  Status is: Inpatient Remains inpatient appropriate because: Stabilization of respiratory function    Anticipated discharge date: 9/2  Family Communication: Husband at the bedside DVT Prophylaxis:  apixaban (ELIQUIS) tablet 5 mg   I have spent 35 minutes in the care of this critically ill patient including review of patient's labs and films, medical decision making and bedside examination.  Author: Annita Brod ,MD 03/10/2022 4:29 PM  To reach On-call, see care teams to locate the attending and reach out via [email or web address]. Between 7PM-7AM, please contact night-coverage If you still have difficulty reaching the attending provider, please page the [facility] (Director  on Call) for [facility] on amion for assistance.

## 2022-03-10 NOTE — Progress Notes (Signed)
Per MD [place] Pink to keep O2 sat at 88-94%. Will continue to monitor.

## 2022-03-10 NOTE — Progress Notes (Signed)
PT Cancellation Note  Patient Details Name: Madison Mcbride MRN: [id number] DOB: 12-Feb-1956   Cancelled Treatment:    Reason Eval/Treat Not Completed: Medical issues which prohibited therapy (Patient is moving to progressive unit for respiratory issues. Currently not appropriate for PT intervention at this time. Will follow up as appropriate.)  Minna Merritts, PT, MPT  Percell Locus 03/10/2022, 3:25 PM

## 2022-03-10 NOTE — Progress Notes (Addendum)
Earlier today I had removed the patients purewick and there was some bright red blood on it but not from the urine. Then later the ambulation team got patient up to the chair and she had drops of bright red blood hit the floor from her bottom area. Spoke to patient about it and she said that she does not have her menstrual cycle anymore. Made MD aware of same. Patient then told me she did not want to know where it was coming from and she did not want anything done about it. Made MD aware about that as well.

## 2022-03-10 NOTE — Progress Notes (Signed)
PHARMACY CONSULT NOTE   Pharmacy Consult for Electrolyte Monitoring and Replacement   Recent Labs: Potassium (mmol/L)  Date Value  03/10/2022 3.7  01/18/2014 3.7   Magnesium (mg/dL)  Date Value  03/10/2022 2.1   Calcium (mg/dL)  Date Value  03/10/2022 8.6 (L)   Calcium, Total (mg/dL)  Date Value  01/18/2014 8.2 (L)   Albumin (g/dL)  Date Value  03/05/2022 3.1 (L)  01/17/2014 2.6 (L)   Phosphorus (mg/dL)  Date Value  03/10/2022 2.9   Sodium (mmol/L)  Date Value  03/10/2022 144  01/18/2014 138    Assessment: 66 y.o. female w/ PMH of CHF, OSA on CPAP, HLD, T2DM, Hypothyroidism, Afib, RLS, Thyroid cancer and chronic Lymphedema, RLS admitted on 03/05/2022 with sepsis secondary to wound infection. Pharmacy is asked to follow and replace electrolytes while in CCU  Diuretics: furosemide 20 mg IV BID  Goal of Therapy:  Electrolytes WNL  Plan:  Kcl 20 mEq x 1, as pt is still on lasix IV. Pt transferred from ICU to 1C. Pharmacy will sign off. Re-consult if needed.   Eleonore Chiquito, PharmD Clinical Pharmacist 03/10/2022 7:56 AM

## 2022-03-10 NOTE — Progress Notes (Signed)
Occupational Therapy Treatment Patient Details Name: Madison Mcbride MRN: [id number] DOB: 24-Jun-1956 Today's Date: 03/10/2022   History of present illness Pt is a 66 yo female that presented to the ED for SOB, weakness. Admitted to ICU for acaute on chronic hypoxic hypercapnic respiratory failure, sepsis due to wound infection.  PMH of COPD, CHF, DM, lymphedema, thyroid cancer, sleep apnea, afib, RLS.   OT comments  Pt./caregiver education was provided about energy conservation and work simplification techniques for ADLs, A/E use for LE ADLs, and pursed lip breathing techniques. Pt. Was provided with a visual hand out. Pt. Was assisted with repositioning with modA to assume an upright position in preparation for her meal. Pt. SPO2 is 94% on 5LO2, HR 109.  Pt. was  alert initially, however presented with periods of intermittent lethargy during the session. Pt.'s husband reports that she gets very sleepy at this time of the day, and again in mid afternoon every day. Pt. Continues to benefit from OT services for ADL training, A/E training, and pt. education about home modification, and DME. Pt. Plans to return home upon discharge with family, and caregivers to assist pt. as needed. Pt. Will benefit from follow-up [place] services upon discharge.    Recommendations for follow up therapy are one component of a multi-disciplinary discharge planning process, led by the attending physician.  Recommendations may be updated based on patient status, additional functional criteria and insurance authorization.    Follow Up Recommendations  Home health OT    Assistance Recommended at Discharge Set up Supervision/Assistance  Patient can return home with the following  A little help with walking and/or transfers;A little help with bathing/dressing/bathroom   Equipment Recommendations       Recommendations for Other Services      Precautions / Restrictions Precautions Precautions:  Fall Restrictions Weight Bearing Restrictions: No       Mobility Bed Mobility               General bed mobility comments: Pt. up in chair upon arrival    Transfers Overall transfer level: Needs assistance                 General transfer comment: Deferred. Pt. with intermittent lethargy.     Balance                                           ADL either performed or assessed with clinical judgement   ADL Overall ADL's : Needs assistance/impaired             Lower Body Bathing: Maximal assistance       Lower Body Dressing: Maximal assistance                      Extremity/Trunk Assessment Upper Extremity Assessment Upper Extremity Assessment: Generalized weakness            Vision Baseline Vision/History: 1 Wears glasses Patient Visual Report: No change from baseline     Perception     Praxis      Cognition Arousal/Alertness: Awake/alert, Lethargic Behavior During Therapy: WFL for tasks assessed/performed Overall Cognitive Status: Within Functional Limits for tasks assessed  Exercises      Shoulder Instructions       General Comments      Pertinent Vitals/ Pain       Pain Assessment Pain Assessment: No/denies pain  Home Living                                          Prior Functioning/Environment              Frequency  Min 2X/week        Progress Toward Goals  OT Goals(current goals can now be found in the care plan section)  Progress towards OT goals: Progressing toward goals  Acute Rehab OT Goals Patient Stated Goal: To return home OT Goal Formulation: With patient Time For Goal Achievement: 03/21/22 Potential to Achieve Goals: Good  Plan      Co-evaluation                 AM-PAC OT "6 Clicks" Daily Activity     Outcome Measure   Help from another person eating meals?: A Little Help from  another person taking care of personal grooming?: A Little Help from another person toileting, which includes using toliet, bedpan, or urinal?: A Little Help from another person bathing (including washing, rinsing, drying)?: A Lot Help from another person to put on and taking off regular upper body clothing?: A Lot Help from another person to put on and taking off regular lower body clothing?: A Lot 6 Click Score: 15    End of Session Equipment Utilized During Treatment: Gait belt  OT Visit Diagnosis: Muscle weakness (generalized) (M62.81);Other abnormalities of gait and mobility (R26.89)   Activity Tolerance Patient tolerated treatment well   Patient Left in chair;with call bell/phone within reach;with nursing/sitter in room   Nurse Communication Mobility status        Time: 1140-1201 OT Time Calculation (min): 21 min  Charges: OT General Charges $OT Visit: 1 Visit OT Treatments $Self Care/Home Management : 8-22 mins  Harrel Carina, MS, OTR/L   Harrel Carina 03/10/2022, 12:10 PM

## 2022-03-10 NOTE — Progress Notes (Signed)
Ryane with Lab called to report a critical pCO2 of 73. Made MD aware. New orders.

## 2022-03-10 NOTE — Consult Note (Addendum)
   Heart Failure Nurse Navigator Note  HFmrEF 45-50%.  No wall motion abnormality noted.  Mild to moderate mitral regurgitation.  She presented to the emergency room with complaints of worsening shortness of breath and activity intolerance.  Comorbidities:  Obstructive sleep apnea on CPAP Hyperlipidemia Type 2 diabetes Hypothyroidism Atrial fibrillation Chronic lymphedema Morbid obesity  Medications:  Apixaban 5 mg daily Atorvastatin 20 mg daily Furosemide 20 mg IV 2 times a day Levothyroxine 125 mcg daily   Labs:  Sodium 144, potassium 3.7, chloride 99, CO2 37, BUN 25, creatinine 1.01, GFR greater than 60, magnesium 2.1, phosphorus 2.9. Weight 152.4 kg Intake 200 mL Output 1475 mL Blood pressure 115/71   With patient today, she is sitting up in the chair at bedside in no acute distress, she is alert and oriented x3.  Her husband [name] is also present at the bedside.  Discussed how she takes care of herself at home, she admits to not weighing daily.  Stressed the importance of daily weights and recording and notifying care providers of 2 pound weight gain overnight or total of 5 pounds within the week.  Also notifying them when there is changes in symptoms such as increasing lower extremity edema, PND, orthopnea etc.  Also discussed diet, abstaining from salt and restaurant eating for which rarely they get carry out.  Discussed fluid restriction and sticking with 64 ounces or less of any liquid.  Son informed that the juicy fruits such as watermelon, peaches or plums should be added in 2 with the fluid restriction.  Made aware that 1 cup of fruit equals 1/2 cup liquid.  She states that she is compliant with her medications although she feels that she has to take quite a few medications.  She sets up a weekly pillbox.    Husband brought up that the PCP had placed her on Celebrex due to her arthritis discomfort.  Discussed how that medication interferes with how her diuretics  work and also cause her to retain fluid.  He voices understanding.  Patient states that Tylenol arthritis does take the edge off her discomfort.  Also talked about follow-up in the outpatient heart failure clinic for which she has an appointment on September 11 at 4:00.  She has a 3% no-show which is 1 out of 34 appointments.  They had no further questions I told him that I would continue to follow along.\  Pricilla Riffle RN CHFN

## 2022-03-10 NOTE — Progress Notes (Signed)
   03/10/22 1231  Assess: MEWS Score  Temp 99.1 F (37.3 C)  BP 115/71  MAP (mmHg) 77  Pulse Rate (!) 132  Resp 20  SpO2 99 %  O2 Device Nasal Cannula  Patient Activity (if Appropriate) In chair  O2 Flow Rate (L/min) 5 L/min  Assess: MEWS Score  MEWS Temp 0  MEWS Systolic 0  MEWS Pulse 3  MEWS RR 0  MEWS LOC 0  MEWS Score 3  MEWS Score Color Yellow  Assess: if the MEWS score is Yellow or Red  Were vital signs taken at a resting state? Yes  Focused Assessment Change from prior assessment (see assessment flowsheet)  Does the patient meet 2 or more of the SIRS criteria? Yes  Does the patient have a confirmed or suspected source of infection? No  MEWS guidelines implemented *See Row Information* Yes  Treat  Pain Score 0  Take Vital Signs  Increase Vital Sign Frequency  Yellow: Q 2hr X 2 then Q 4hr X 2, if remains yellow, continue Q 4hrs  Escalate  MEWS: Escalate Yellow: discuss with charge nurse/RN and consider discussing with provider and RRT  Notify: Charge Nurse/RN  Name of Charge Nurse/RN Notified Montey Hora, RN  Date Charge Nurse/RN Notified 03/10/22  Time Charge Nurse/RN Notified 1235  Notify: Provider  Provider Name/Title Dr. Maryland Pink  Date Provider Notified 03/10/22  Time Provider Notified 1235  Method of Notification  (messaged)  Notification Reason Change in status  Provider response En route;See new orders  Date of Provider Response 03/10/22  Time of Provider Response 1237  Document  Patient Outcome Other (Comment) (patient feeling better placed on HFNC 7LPM)  Progress note created (see row info) Yes  Assess: SIRS CRITERIA  SIRS Temperature  0  SIRS Pulse 1  SIRS Respirations  0  SIRS WBC 1  SIRS Score Sum  2

## 2022-03-10 NOTE — Progress Notes (Signed)
Mobility Specialist - Progress Note   03/10/22 1417  Mobility  Activity Transferred from chair to bed  Level of Assistance +2 (takes two people)  Games developer wheel walker  Distance Ambulated (ft) 4 ft  Activity Response Tolerated well  $Mobility charge 1 Mobility    During mobility:76 HR ,93% SpO2  Pt sitting in recliner upon arrival on High Flow. Completes STS ModA+2 from recliner, three rocks before initiating stand for momentum. 4 side steps to complete transfer, forward flex improved this session but unable to fully extend UE. Pt descends to bed and completes  MaxA +2. Pt is left in bed with needs in reach and bed alarm set.  Merrily Brittle Mobility Specialist 03/10/22, 2:33 PM

## 2022-03-11 DIAGNOSIS — A419 Sepsis, unspecified organism: Secondary | ICD-10-CM | POA: Diagnosis not present

## 2022-03-11 DIAGNOSIS — R6520 Severe sepsis without septic shock: Secondary | ICD-10-CM

## 2022-03-11 DIAGNOSIS — J9621 Acute and chronic respiratory failure with hypoxia: Secondary | ICD-10-CM | POA: Diagnosis not present

## 2022-03-11 DIAGNOSIS — I5023 Acute on chronic systolic (congestive) heart failure: Secondary | ICD-10-CM | POA: Diagnosis not present

## 2022-03-11 LAB — CBC
HCT: 31.8 % — ABNORMAL LOW (ref 36.0–46.0)
Hemoglobin: 8.5 g/dL — ABNORMAL LOW (ref 12.0–15.0)
MCH: 23.2 pg — ABNORMAL LOW (ref 26.0–34.0)
MCHC: 26.7 g/dL — ABNORMAL LOW (ref 30.0–36.0)
MCV: 86.9 fL (ref 80.0–100.0)
Platelets: 277 10*3/uL (ref 150–400)
RBC: 3.66 MIL/uL — ABNORMAL LOW (ref 3.87–5.11)
RDW: 19 % — ABNORMAL HIGH (ref 11.5–15.5)
WBC: 11.2 10*3/uL — ABNORMAL HIGH (ref 4.0–10.5)
nRBC: 0 % (ref 0.0–0.2)

## 2022-03-11 LAB — BASIC METABOLIC PANEL
Anion gap: 10 (ref 5–15)
BUN: 24 mg/dL — ABNORMAL HIGH (ref 8–23)
CO2: 36 mmol/L — ABNORMAL HIGH (ref 22–32)
Calcium: 8.5 mg/dL — ABNORMAL LOW (ref 8.9–10.3)
Chloride: 96 mmol/L — ABNORMAL LOW (ref 98–111)
Creatinine, Ser: 1.05 mg/dL — ABNORMAL HIGH (ref 0.44–1.00)
GFR, Estimated: 58 mL/min — ABNORMAL LOW (ref >60)
Glucose, Bld: 84 mg/dL (ref 70–99)
Potassium: 3.7 mmol/L (ref 3.5–5.1)
Sodium: 142 mmol/L (ref 135–145)

## 2022-03-11 LAB — GLUCOSE, CAPILLARY
Glucose-Capillary: 108 mg/dL — ABNORMAL HIGH (ref 70–99)
Glucose-Capillary: 120 mg/dL — ABNORMAL HIGH (ref 70–99)
Glucose-Capillary: 138 mg/dL — ABNORMAL HIGH (ref 70–99)
Glucose-Capillary: 83 mg/dL (ref 70–99)
Glucose-Capillary: 89 mg/dL (ref 70–99)
Glucose-Capillary: 94 mg/dL (ref 70–99)

## 2022-03-11 LAB — PROCALCITONIN: Procalcitonin: 0.19 ng/mL

## 2022-03-11 MED ORDER — ROPINIROLE HCL 1 MG PO TABS
2.0000 mg | ORAL_TABLET | Freq: Two times a day (BID) | ORAL | Status: DC
  Administered 2022-03-11 – 2022-03-19 (×16): 2 mg via ORAL
  Filled 2022-03-11 (×16): qty 2

## 2022-03-11 MED ORDER — OXYCODONE-ACETAMINOPHEN 7.5-325 MG PO TABS
1.0000 | ORAL_TABLET | ORAL | Status: DC | PRN
  Administered 2022-03-11 – 2022-03-19 (×28): 1 via ORAL
  Filled 2022-03-11 (×30): qty 1

## 2022-03-11 MED ORDER — IPRATROPIUM-ALBUTEROL 0.5-2.5 (3) MG/3ML IN SOLN
3.0000 mL | Freq: Two times a day (BID) | RESPIRATORY_TRACT | Status: DC
  Administered 2022-03-11 – 2022-03-13 (×4): 3 mL via RESPIRATORY_TRACT
  Filled 2022-03-11 (×4): qty 3

## 2022-03-11 MED ORDER — GABAPENTIN 100 MG PO CAPS
200.0000 mg | ORAL_CAPSULE | Freq: Two times a day (BID) | ORAL | Status: DC
  Administered 2022-03-11 – 2022-03-19 (×16): 200 mg via ORAL
  Filled 2022-03-11 (×17): qty 2

## 2022-03-11 MED ORDER — ROPINIROLE HCL 1 MG PO TABS
3.0000 mg | ORAL_TABLET | Freq: Every day | ORAL | Status: DC
  Administered 2022-03-11 – 2022-03-18 (×8): 3 mg via ORAL
  Filled 2022-03-11 (×8): qty 3

## 2022-03-11 MED ORDER — KETOROLAC TROMETHAMINE 15 MG/ML IJ SOLN
15.0000 mg | Freq: Once | INTRAMUSCULAR | Status: AC
  Administered 2022-03-11: 15 mg via INTRAVENOUS
  Filled 2022-03-11: qty 1

## 2022-03-11 MED ORDER — SPIRONOLACTONE 25 MG PO TABS
25.0000 mg | ORAL_TABLET | Freq: Every day | ORAL | Status: DC
  Administered 2022-03-11 – 2022-03-19 (×9): 25 mg via ORAL
  Filled 2022-03-11 (×9): qty 1

## 2022-03-11 MED ORDER — GABAPENTIN 300 MG PO CAPS
300.0000 mg | ORAL_CAPSULE | Freq: Every day | ORAL | Status: DC
  Administered 2022-03-12 – 2022-03-18 (×7): 300 mg via ORAL
  Filled 2022-03-11 (×7): qty 1

## 2022-03-11 MED ORDER — TRAMADOL HCL 50 MG PO TABS: 50.0000 mg | ORAL_TABLET | Freq: Four times a day (QID) | ORAL | Status: DC | PRN

## 2022-03-11 NOTE — Progress Notes (Signed)
Physical Therapy Treatment Patient Details Name: Madison Mcbride MRN: [id number] DOB: 11-09-1955 Today's Date: 03/11/2022   History of Present Illness Pt is a 66 yo female that presented to the ED for SOB, weakness. Admitted to ICU for acaute on chronic hypoxic hypercapnic respiratory failure, sepsis due to wound infection.  PMH of COPD, CHF, DM, lymphedema, thyroid cancer, sleep apnea, afib, RLS.    PT Comments    Patient received standing at edge of bed with mobility specialists. Patient requires increased time and max cues for taking a few steps from bed to recliner. Patient leaning on forearms when using RW. Increased fall risk. Patient requiring extensive assist at this time and discharge disposition updated to SNF. Patient will continue to benefit from skilled PT to improve functional independence and safety.     Recommendations for follow up therapy are one component of a multi-disciplinary discharge planning process, led by the attending physician.  Recommendations may be updated based on patient status, additional functional criteria and insurance authorization.  Follow Up Recommendations  Skilled nursing-short term rehab (<3 hours/day) Can patient physically be transported by private vehicle: No   Assistance Recommended at Discharge Frequent or constant Supervision/Assistance  Patient can return home with the following A lot of help with walking and/or transfers;A lot of help with bathing/dressing/bathroom;Assist for transportation   Equipment Recommendations  None recommended by PT    Recommendations for Other Services       Precautions / Restrictions Precautions Precautions: Fall Restrictions Weight Bearing Restrictions: No     Mobility  Bed Mobility               General bed mobility comments: Patient standing at edge of bed upon arrival with mobility specialists    Transfers Overall transfer level: Needs assistance Equipment used: Rolling walker (2  wheels) Transfers: Sit to/from Stand Sit to Stand: From elevated surface, Mod assist, +2 physical assistance           General transfer comment: (per mobility specialists), patient was unable to stand from low bed with several attempts.    Ambulation/Gait Ambulation/Gait assistance: Min assist, +2 physical assistance, +2 safety/equipment Gait Distance (Feet): 3 Feet Assistive device: Rolling walker (2 wheels) Gait Pattern/deviations: Step-to pattern, Decreased step length - right, Decreased step length - left, Decreased stride length Gait velocity: very slow     General Gait Details: patient required tactile cues and increased time for taking a few steps from bed to recliner. patient leaning on forearms on RW during transfer.   Stairs             Wheelchair Mobility    Modified Rankin (Stroke Patients Only)       Balance Overall balance assessment: Needs assistance Sitting-balance support: Feet supported Sitting balance-Leahy Scale: Good     Standing balance support: Bilateral upper extremity supported, During functional activity, Reliant on assistive device for balance Standing balance-Leahy Scale: Poor Standing balance comment: leaning heavily on RW                            Cognition Arousal/Alertness: Awake/alert Behavior During Therapy: WFL for tasks assessed/performed Overall Cognitive Status: Within Functional Limits for tasks assessed                                          Exercises Other Exercises Other Exercises: seated  BLE exercises: LAQ, marching, toe taps x 10    General Comments        Pertinent Vitals/Pain Pain Assessment Faces Pain Scale: Hurts a little bit Pain Location: bilateral legs with AROM, middle of back Pain Descriptors / Indicators: Discomfort Pain Intervention(s): Monitored during session, Repositioned    Home Living                          Prior Function            PT  Goals (current goals can now be found in the care plan section) Acute Rehab PT Goals Patient Stated Goal: to get her strength back PT Goal Formulation: With patient Time For Goal Achievement: 03/21/22 Potential to Achieve Goals: Fair Progress towards PT goals: Not progressing toward goals - comment (requiring +2 assist for transfer bed to recliner)    Frequency    Min 2X/week      PT Plan Discharge plan needs to be updated    Co-evaluation              AM-PAC PT "6 Clicks" Mobility   Outcome Measure  Help needed turning from your back to your side while in a flat bed without using bedrails?: A Lot Help needed moving from lying on your back to sitting on the side of a flat bed without using bedrails?: A Lot Help needed moving to and from a bed to a chair (including a wheelchair)?: A Lot Help needed standing up from a chair using your arms (e.g., wheelchair or bedside chair)?: A Lot Help needed to walk in hospital room?: A Lot Help needed climbing 3-5 steps with a railing? : Total 6 Click Score: 11    End of Session Equipment Utilized During Treatment: Oxygen Activity Tolerance: Patient limited by fatigue;Patient limited by pain Patient left: in chair;with call bell/phone within reach;with chair alarm set Nurse Communication: Mobility status PT Visit Diagnosis: Other abnormalities of gait and mobility (R26.89);Difficulty in walking, not elsewhere classified (R26.2);Muscle weakness (generalized) (M62.81);Pain Pain - part of body:  (Back, legs)     Time: 1047-1100 PT Time Calculation (min) (ACUTE ONLY): 13 min  Charges:  $Therapeutic Exercise: 8-22 mins                     Kingjames Coury, PT, GCS 03/11/22,12:07 PM

## 2022-03-11 NOTE — Progress Notes (Signed)
ABG puncture obtained and resulted, results read to charge RN. See results tab for ABG.

## 2022-03-11 NOTE — Progress Notes (Signed)
ABG resulted NP Foust made aware. Will continue to monitor.

## 2022-03-11 NOTE — Progress Notes (Signed)
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   HPI  66 y.o female with significant PMH of HFpEF (LVEF 45-50% 03/2021, OSA on CPAP, HLD, T2DM, Hypothyroidism, Afib, RLS, Thyroid cancer and chronic Lymphedema who presented to the ED with chief complaints of worsening shortness of breath.  Per patient's husband who is at the bedside, patient has required increased oxygenation for the past few days due to persistent hypoxia in the 60's and activity intolerance.  ED Course: In the emergency department, vital signs showeD blood pressure of 109/55 mm Hg, HR of 55 beats per minute, a respiratory rate of 12 breaths per minute, and a temperature of 98.7F (36.7C) sats  100% on NRB   03/11/22- patient seen and examined.  She is on supplemental O2 on 3.5L/min [place] with bipap nocturnally.  She is 7.5L net negative however still edematous.  She is complaining of neck and back pain.  She shares this is worse then previous .She did PT and developed worsening back pain with tachyarrythmia but after resting improved with NSR.  We reivewed lab work together and renal function is improved, ive added aldactone today to further diurese her as there is still crackles on auscultation bilaterally and LE edema.   Patient placed on BiPAP given concerns for mild CHF exacerbation.  Due to concerns for possible sepsis secondary to cellulitis, patient received gentle IV fluid boluses and was started on broad-spectrum antibiotics.   Despite IV fluid boluses patient remained hypotensive therefore Levophed was started and PCCM consulted.  Past Medical History   HFpEF (LVEF 45-50% 03/2021, OSA on CPAP, HLD, T2DM, Hypothyroidism, RLS, Thyroid cancer and chronic Lymphedema  Significant Hospital Events   8/25: Admitted to ICU with Acute on Chronic hypoxic hypercapnic respiratory failure 8/26: Weaned off pressors overnight. Infectious markers improving, renal function improving, start diuresis  Consults:  None  Procedures:  None  Significant Diagnostic Tests:  8/24: Chest Xray>Cardiomegaly with central vascular prominence and mild interstitial Edema 8/25: Venous US BLE>>: No  lower extremity DVT. 8/25: Echocardiogram>> 1. Left ventricular ejection fraction, by estimation, is 45 to 50%. The  left ventricle has mildly decreased function. The left ventricle has no  regional wall motion abnormalities. Left ventricular diastolic parameters  were normal.   2. Right ventricular systolic function is normal. The right ventricular  size is normal.   3. The mitral valve is normal in structure. Mild to moderate mitral valve  regurgitation. No evidence of mitral stenosis.   4. Tricuspid valve regurgitation is mild to moderate.   5. The aortic valve is normal in structure. Aortic valve regurgitation is moderate. No aortic stenosis is present.   6. The inferior vena cava is normal in size with greater than 50%  respiratory variability, suggesting right atrial pressure of 3 mmHg.   Micro Data:  8/24: Blood culture x2> NGTD 8/24: Urine Culture> 8/24: MRSA PCR>> negative 8/24: Strep pneumo urinary antigen>negative 8/24: Legionella urinary antigen>  Antimicrobials:  Vancomycin 8/24> Cefepime 8/24 >  OBJECTIVE  Blood pressure  126/69, pulse 73, temperature 98.3 F (36.8 C), resp. rate 20, height '5\' 4"'$  (1.626 m), weight (!) 152.4 kg, SpO2 (!) 89 %.    FiO2 (%):  [35 %] 35 %   Intake/Output Summary (Last 24 hours) at 03/11/2022  1202 Last data filed at 03/11/2022 1027 Gross per 24 hour  Intake 920 ml  Output 1250 ml  Net -330 ml    Filed Weights   03/08/22 0500 03/09/22 0420 03/10/22 0414  Weight: (!) 154.1 kg (!) 154.1 kg (!) 152.4 kg   Physical Examination  GENERAL: 66  year-old acute on chronically ill appearing patient, lying in the bed on [place].  EYES: Pupils equal, round, reactive to light and accommodation. No scleral icterus. Extraocular muscles intact.  HEENT: Head atraumatic, normocephalic. Oropharynx and nasopharynx clear.  NECK:  Supple, no jugular venous distention. No thyroid enlargement, no tenderness.  LUNGS: Normal breath sounds bilaterally, no wheezing, rales,rhonchi or crepitation. No use of accessory muscles of respiration.  CARDIOVASCULAR: S1, S2 normal. No murmurs, rubs, or gallops.  ABDOMEN: Soft, nontender,  OBESE, distended. Bowel sounds present. No organomegaly or mass.  EXTREMITIES:SEE BELOW NEUROLOGIC: Cranial nerves II through XII are intact.  Muscle strength 5/5 in all extremities. Sensation intact. Gait not checked.  PSYCHIATRIC: The patient is alert and oriented x 3.  SKIN:SEE BELOW     Labs/imaging that I havepersonally reviewed  (right click and "Reselect all SmartList Selections" daily)     Labs   CBC: Recent Labs  Lab 03/05/22 1743 03/06/22 0404 03/07/22 0545 03/08/22 0454 03/09/22 0430 03/10/22 0529 03/11/22 0413  WBC 14.5*   < > 9.7 10.5 10.7* 12.9* 11.2*  NEUTROABS 11.7*  --   --   --   --   --   --   HGB 9.9*   < > 9.4* 9.3* 8.9* 8.9* 8.5*  HCT 36.4   < > 34.5* 34.0* 32.4* 32.6* 31.8*  MCV 88.3   < > 85.2 85.4 86.4 86.0 86.9  PLT 343   < > 273 265 240 267 277   < > = values in this interval not displayed.     Basic Metabolic Panel: Recent Labs  Lab 03/06/22 0404 03/07/22 0545 03/08/22 0454 03/09/22 0430 03/10/22 0529 03/11/22 0413  NA 142 142 143 143 144 142  K 5.0 3.6 3.4* 3.3* 3.7 3.7  CL 99 101 100 98 99 96*  CO2 34* 33* 36* 37* 37* 36*   GLUCOSE 117* 83 108* 113* 125* 84  BUN 65* 52* 42* 31* 25* 24*  CREATININE 2.00* 1.48* 1.28* 1.03* 1.01* 1.05*  CALCIUM 8.2* 8.3* 8.5* 8.4* 8.6* 8.5*  MG 2.3 2.2 2.2 2.2 2.1  --   PHOS 6.3* 4.6 3.4  --  2.9  --     GFR: Estimated Creatinine Clearance: 77 mL/min (A) (by C-G formula based on SCr of 1.05 mg/dL (H)). Recent Labs  Lab 03/05/22 2140 03/06/22 0404 03/07/22 0545 03/08/22 0454 03/09/22 0430 03/10/22 0529 03/10/22 1442 03/11/22 0413  PROCALCITON  --   --  0.22 0.10  --   --  0.22 0.19  WBC  --  16.0* 9.7 10.5 10.7* 12.9*  --  11.2*  LATICACIDVEN 1.0 0.9  --   --   --   --  1.2  --      Liver Function Tests: Recent Labs  Lab 03/05/22 1743  AST 25  ALT 14  ALKPHOS 92  BILITOT 1.5*  PROT 7.1  ALBUMIN 3.1*  No results for input(s): "LIPASE", "AMYLASE" in the last 168 hours. Recent Labs  Lab 03/08/22 1000  AMMONIA 25    ABG    Component Value Date/Time   PHART 7.43 03/11/2022 0459   PCO2ART 67 ([place]) 03/11/2022 0459   PO2ART 84 03/11/2022 0459   HCO3 44.5 (H) 03/11/2022 0459   O2SAT 97.6 03/11/2022 0459     Coagulation Profile: No results for input(s): "INR", "PROTIME" in the last 168 hours.  Cardiac Enzymes: No results for input(s): "CKTOTAL", "CKMB", "CKMBINDEX", "TROPONINI" in the last 168 hours.  HbA1C: Hgb A1c MFr Bld  Date/Time Value Ref Range Status  03/06/2022 04:04 AM 6.4 (H) 4.8 - 5.6 % Final    Comment:    (NOTE) Pre diabetes:          5.7%-6.4%  Diabetes:              >6.4%  Glycemic control for   <7.0% adults with diabetes   03/15/2021 05:27 AM 6.3 (H) 4.8 - 5.6 % Final    Comment:    (NOTE) Pre diabetes:          5.7%-6.4%  Diabetes:              >6.4%  Glycemic control for   <7.0% adults with diabetes     CBG: Recent Labs  Lab 03/10/22 1939 03/10/22 2332 03/11/22 0449 03/11/22 0731 03/11/22 1132  GLUCAP 114* 94 89 83 108*     Review of Systems:   Positives in BOLD: Gen: Denies fever, chills, weight  change, fatigue, night sweats HEENT: Denies blurred vision, double vision, hearing loss, tinnitus, sinus congestion, rhinorrhea, sore throat, neck stiffness, dysphagia PULM: Denies shortness of breath, cough, sputum production, hemoptysis, wheezing CV: Denies chest pain, edema, orthopnea, paroxysmal nocturnal dyspnea, palpitations GI: Denies abdominal pain, nausea, vomiting, diarrhea, hematochezia, melena, constipation, change in bowel habits GU: Denies dysuria, hematuria, polyuria, oliguria, urethral discharge Endocrine: Denies hot or cold intolerance, polyuria, polyphagia or appetite change Derm: Denies rash, dry skin, scaling or peeling skin change Heme: Denies easy bruising, bleeding, bleeding gums Neuro: Denies headache, numbness, weakness, slurred speech, loss of memory or consciousness   Past Medical History  She,  has a past medical history of CHF (congestive heart failure) ([place]), Diabetes mellitus without complication ([place]), Hypothyroidism, Lymphedema, RLS (restless legs syndrome), Sleep apnea, and Thyroid cancer ([place]) (2007).   Surgical History    Past Surgical History:  Procedure Laterality Date   ABDOMINAL HYSTERECTOMY     COLONOSCOPY WITH PROPOFOL N/A 08/26/2015   Procedure: COLONOSCOPY WITH PROPOFOL;  Surgeon: Manya Silvas, MD;  Location: [facility] ENDOSCOPY;  Service: Endoscopy;  Laterality: N/A;   THYROIDECTOMY       Social History   reports that she has never smoked. She has never used smokeless tobacco. She reports that she does not drink alcohol and does not use drugs.   Family History   Her family history is negative for Breast cancer.   Allergies No Known Allergies   Home Medications  Prior to Admission medications   Medication Sig Start Date End Date Taking? Authorizing Provider  apixaban (ELIQUIS) 5 MG TABS tablet Take 1 tablet (5 mg total) by mouth 2 (two) times daily. 07/25/21  Yes Dessa Phi, DO  atorvastatin (LIPITOR) 20 MG tablet Take 20 mg by mouth  daily. 01/31/20  Yes [provider]  Biotin 2.5 MG CAPS Take 2.5 mg by mouth daily.    Yes [provider]  buPROPion (WELLBUTRIN XL) 150  MG 24 hr tablet Take 150 mg by mouth daily. 01/10/21  Yes [provider]  carvedilol (COREG) 3.125 MG tablet Take 1 tablet (3.125 mg total) by mouth 2 (two) times daily with a meal. 07/28/21  Yes Dessa Phi, DO  celecoxib (CELEBREX) 200 MG capsule Take 200 mg by mouth 2 (two) times daily. 02/10/22  Yes [provider]  Cholecalciferol 25 MCG (1000 UT) tablet Take 1 tablet by mouth daily.   Yes [provider]  gabapentin (NEURONTIN) 300 MG capsule Take 300 mg by mouth 3 (three) times daily. 02/09/22  Yes [provider]  levothyroxine (SYNTHROID) 125 MCG tablet Take 1 tablet (125 mcg total) by mouth daily. 07/26/21  Yes Dessa Phi, DO  Multiple Vitamin (MULTIVITAMIN) tablet Take 1 tablet by mouth daily.   Yes [provider]  pantoprazole (PROTONIX) 40 MG tablet Take 40 mg by mouth daily. 01/11/21  Yes [provider]  potassium chloride SA (KLOR-CON M) 20 MEQ tablet Take 20 mEq by mouth daily. 12/15/21  Yes [provider]  propranolol (INDERAL) 10 MG tablet Take 20 mg by mouth 4 (four) times daily.   Yes [provider]  rOPINIRole (REQUIP) 3 MG tablet Take 3 mg by mouth 3 (three) times daily. 01/20/18  Yes [provider]  Semaglutide, 1 MG/DOSE, 2 MG/1.5ML SOPN Inject 0.75 mLs into the skin once a week. 10/14/20  Yes [provider]  torsemide (DEMADEX) 20 MG tablet Take 2 tablets (40 mg total) by mouth daily. 04/14/21  Yes Darylene Price A, FNP  zinc gluconate 50 MG tablet Take 50 mg by mouth daily.   Yes [provider]  Scheduled Meds:  apixaban  5 mg Oral BID   atorvastatin  20 mg Oral Daily   budesonide (PULMICORT) nebulizer solution  0.25 mg Nebulization BID   buPROPion  150 mg Oral Daily   furosemide  40 mg Intravenous BID   gabapentin  300  mg Oral TID   insulin aspart  0-9 Units Subcutaneous Q4H   ipratropium-albuterol  3 mL Nebulization Q6H   levothyroxine  125 mcg Oral Q0600   lidocaine  1 patch Transdermal Daily   pantoprazole  40 mg Oral Daily   rOPINIRole  3 mg Oral TID   Continuous Infusions:  cefTRIAXone (ROCEPHIN)  IV 2 g (03/11/22 1008)   PRN Meds:.acetaminophen, ALPRAZolam, docusate sodium, ipratropium-albuterol, mouth rinse, polyethylene glycol   Active Hospital Problem list     Assessment & Plan:   Acute Hypoxic Hypercapnic Respiratory Failure secondary to CHF exacerbation  OSA on CPAP COPD without evidence of acute exacerbation -Supplemental O2 as needed to maintain O2 sats 88 to 92% -BiPAP qhs and as needed -Follow intermittent Chest X-ray & ABG as needed -Bronchodilators & Pulmicort nebs -Diuresis as BP and renal function permits ~ Start Lasix IV 20 mg BID -Aggressive Pulmonary toilet as able -Mobilize as able, consult PT/OT   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%) BNP 955 on admission Shock: Septic +/- Cardiogenic ~ RESOLVED Mildly elevated Troponin, suspect demand ischemia PMHx: HTN, HLD, Atrial fibrillation Echocardiogram 03/06/22: LVEF 16-96%, normal diastolic parameters, RV systolic function normal, mild to moderate MR, mild to moderate TR -Continuous cardiac monitoring -Maintain MAP >65 -Vasopressors as needed to maintain MAP goal ~ weaned off since evening of 8/25 -Lactic acid has normalized -Trend HS Troponin until peaked (16 ~ 29 ~ ) -Cortisol normal 14.8 -Diuresis as BP and renal function permits ~ Start 20 mg IV Lasix BID now that  AKI improving -Consider resuming home carvedilol 6.'25mg'$  BID, torsemine '40mg'$   -Continue Eliquis and Atorvastatin   Severe Sepsis in the setting of suspected Cellulitis of bilateral lower extremities & questionable UTI PMHx: chronic bilateral venous stasis Meets SIRS Criteria  -Monitor fever curve -Trend WBC's & Procalcitonin -Follow cultures as  above -Continue empiric Cefepime & Vancomycin pending cultures & sensitivities -Wound care   AKI on CKD Stage III ~ IMPROVING -Monitor I&O's / urinary output -Follow BMP -Ensure adequate renal perfusion -Avoid nephrotoxic agents as able -Replace electrolytes as indicated -Pharmacy following for assistance with electrolyte replacement   Diabetes Mellitus Type II Hgb A1c is 6.4 -CBG's AC & qhs; Target range of 140 to 180 -SSI -Follow ICU Hypo/Hyperglycemia protocol   Hypothyroidism -Continue home Synthroid -TSH and thyroid panel pending     Best practice:  Diet:  Regular Pain/Anxiety/Delirium protocol (if indicated): No VAP protocol (if indicated): Not indicated DVT prophylaxis: DOAC GI prophylaxis: PPI Glucose control:  SSI Yes Central venous access:  N/A Arterial line:  N/A Foley:  N/A Mobility:  OOB as tolerated  PT consulted: yes Last date of multidisciplinary goals of care discussion [8/25] Code Status:  full code Disposition: ICU     Ottie Glazier, M.D.  [facility] [place]

## 2022-03-11 NOTE — Progress Notes (Addendum)
Pt HR sustaining at > 130. Pt has no complaints just a back pain and was administered PRN medicine. NP Randol Kern made aware. Will continue to monitor.  Update 2045: NP Morrsion came at bedside but no new order was place. Will continue to monitor.

## 2022-03-11 NOTE — Progress Notes (Signed)
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   Patient was admitted to ICU with Acute Hypoxic Hypercapnic Respiratory Failure secondary to CHF decompensation requiring BiPAP, along with severe Sepsis with septic shock in setting of UTI vs ?cellulitis of bilateral lower extremities vs stasis dermatitis due to peripheral edema.  She has open superficial wound on each lower leg from fluid weeping which formed blisters.  Stabilized and patient transferred to hospitalist service on 8/27.    Since then, patient has been receiving diuresis over 6.5 L.  Over the past 2 days, patient's oxygen requirement slightly increased.  And on 8/29, patient noted to have low-grade fever.  Had episode of brief tachycardia and ABG done noted worsening hypercarbia and hypoxia.  Patient placed on BiPAP.  By 8/30, doing well and able to maintain on 3 L nasal cannula.  Patient breathing a little more comfortably  Assessment and Plan: Assessment and Plan: * Acute on chronic systolic CHF (congestive heart failure) ([place]) Presented with progressive dyspnea, hypoxia, lower extremity edema, BNP 955 and CXR with cardiomegaly and central vascular congestion with mild interstitial edema.  Initially diuretics held due to hypotension and shock. Lasix started once patient's blood pressure stabilized.  Lasix increased to 40 twice daily on 8/29.  And he has since diuresed almost 11 L and is more than -7 L deficient.   Home torsemide and metolazone on hold.  Given morbid obesity, BNP is likely significantly likely underestimating degree of heart failure.  Acute on chronic respiratory failure with hypoxia and hypercapnia  (HCC) At baseline, patient uses trilogy NIV at home.  Presented with worsening shortness of breath and hypoxia requiring BiPAP.  Supplemental O2 to maintain sats 88 to 93%.  In review of patient's vitals, oxygen levels in the last day have required 4 to 5 L, but oxygen saturations have been higher than needed, greater than 95% which may be precipitating cause for acute respiratory failure and hypercarbia currently.  Patient changed over to BiPAP.  Able to be weaned off of BiPAP during the day by 8/30.  PCO2 notes improvement and patient breathing more comfortably.  Shock (HCC)-resolved as of 03/10/2022 Septic versus cardiogenic shock -resolved. Briefly required pressors on admission to ICU. Now off pressors. -- Monitor BP closely -- Treat CHF and infection as outlined -- Maintain MAP over 65 -- Home Coreg, torsemide and ARB are on hold, resume when BP will tolerate  Severe sepsis (HCC)-resolved as of 03/10/2022 .Patient met SIRS criteria on admission with leukocytosis and tachypnea in setting of suspected UTI versus cellulitis.  Unclear if shock is cardiogenic or septic in nature, cannot rule out septic shock but clinically undetermined.  Sepsis itself stabilized. -- Sepsis physiology improved which was de-escalated to Rocephin. Procalcitonin and white blood cell count continue to trend downward  Lymphedema of both lower extremities With increased swelling to the point of weeping and blister formation from fluid, erythema bilaterally consistent with stasis dermatitis and lymphedema.  Will continue antibiotic coverage given open skin wounds. -- De-escalated antibiotics to Rocephin -- Continue diuresis  Acute metabolic encephalopathy-resolved as of 03/10/2022 POA, likely due to hypoxia and infection.  Hypercarbia may contribute but  pH is compensated.  Ammonia level normal.  No focal neurologic deficits.   ICU environment contributes.  Avoiding sedating medications.  Has been feels close to baseline  and should improve even further with treatment of worsening hypercarbia.  PAF (paroxysmal atrial fibrillation) (HCC) Continue Eliquis.  Coreg on hold until BP improves.  Monitor on telemetry.  Had brief episode on 8/29 due to worsening respiratory failure.  Acute kidney injury in the setting of stage 3a chronic kidney disease ([place]) AKI superimposed on CKD IIIa Presented with creatinine 1.97 in the setting of decompensated CHF and volume overload, probable infection, likely prerenal AKI.  Renal function close to baseline.  Monitor while aggressively diuresing.  OSA (obstructive sleep apnea) Uses trilogy/NIV at home.   BiPAP at night  Elevated troponin Mild and flat trend.  Likely demand ischemia in the setting of decompensated CHF and shock.  Patient is chest pain-free and EKG non-acute.  Type 2 diabetes mellitus with diabetic neuropathy, without long-term current use of insulin (HCC) Continue sliding scale NovoLog  Benign essential hypertension Presented in shock requiring vasopressors.  BP has stabilized but home antihypertensives are still on hold.  Resume when BP will tolerate.  Hypothyroidism Continue Synthroid  HLD (hyperlipidemia) Continue statin  Hypokalemia-resolved as of 03/10/2022 Replacing oral KCl for K3.3 this morning.  Monitor BMP, replace K as needed.  Morbid obesity with BMI of 50.0-59.9, adult (HCC) Body mass index is 58.31 kg/m. Complicates overall care and prognosis.       Body mass index is 57.67 kg/m.        Consultants: Critical care  Procedures: Echocardiogram done 8/25: Preserved diastolic function.  Ejection fraction 45%  Antimicrobials: IV Rocephin 8/27-present IV cefepime and vancomycin 8/25 - 8/26   Code Status: Full code   Subjective: Feeling better off of BiPAP.  Breathing more comfortably today.  Objective: Noted hypoxia Vitals:   03/11/22 1214 03/11/22 1512  BP:  134/62  Pulse: 78 75  Resp:  17  Temp:  97.7 F (36.5  C)  SpO2: 93% 96%    Intake/Output Summary (Last 24 hours) at 03/11/2022 1812 Last data filed at 03/11/2022 1027 Gross per 24 hour  Intake 920 ml  Output 1250 ml  Net -330 ml    Filed Weights   03/08/22 0500 03/09/22 0420 03/10/22 0414  Weight: (!) 154.1 kg (!) 154.1 kg (!) 152.4 kg   Body mass index is 57.67 kg/m.  Exam:  General: Alert and oriented x3, no acute distress HEENT: Normocephalic, atraumatic, narrow airway Cardiovascular: Irregular rhythm, rate controlled Respiratory: Decreased breath sounds throughout secondary to body habitus Abdomen: Soft, obese, nontender, positive bowel sounds Musculoskeletal: No clubbing or cyanosis, 1+ pitting edema Skin: No breaks, tears or lesion, bilateral erythema Psychiatry: Appropriate, no evidence of psychoses and Neurology: Focal deficits  Data Reviewed: Stable renal function, improvement in procalcitonin and white blood cell count  Disposition:  Status is: Inpatient Remains inpatient appropriate because: Stabilization of respiratory function and fully diuresed    Anticipated discharge date: 9/2  Family Communication: Husband at the bedside DVT Prophylaxis:  apixaban (ELIQUIS) tablet 5 mg   I have spent 35 minutes in the care of this critically ill patient including review of patient's labs and films, medical decision making and bedside examination.  Author: Annita Brod ,MD 03/11/2022 6:12 PM  To reach On-call, see care teams to locate the attending and reach out via [email or web address]. Between 7PM-7AM, please contact night-coverage If you still have difficulty reaching the attending provider, please  page the [facility] (Director on Call) for [facility] on amion for assistance.

## 2022-03-11 NOTE — Progress Notes (Addendum)
Occupational Therapy Treatment Patient Details Name: Madison Mcbride MRN: [id number] DOB: 04/20/1956 Today's Date: 03/11/2022   History of present illness Pt is a 66 yo female that presented to the ED for SOB, weakness. Admitted to ICU for acaute on chronic hypoxic hypercapnic respiratory failure, sepsis due to wound infection.  PMH of COPD, CHF, DM, lymphedema, thyroid cancer, sleep apnea, afib, RLS.   OT comments  Pt. sitting upright in the recliner upon arrival. Pt. presents with 6/10 mid, and lower back pain. Pt. SPO2 91-93% on 4LO2, HR 52-105 bpms. Pt. presents with limitations with bilateral shoulder ROM. Pt. attributes it to a history of arthritis. Pt. performed bilateral bicep, and tricep strengthening, and tolerated 1 set 10 reps each with rest breaks. UE strengthening was initiated to improve strength needed to complete ADLs, IADLs and functional transfers. Pt. Education was provided about PLB techniques with visual demonstration. Pt. requires MaxA LE ADLs.    Recommendations for follow up therapy are one component of a multi-disciplinary discharge planning process, led by the attending physician.  Recommendations may be updated based on patient status, additional functional criteria and insurance authorization.    Follow Up Recommendations  Home health OT    Assistance Recommended at Discharge    Patient can return home with the following  A little help with walking and/or transfers;A little help with bathing/dressing/bathroom   Equipment Recommendations  BSC/3in1    Recommendations for Other Services      Precautions / Restrictions         Mobility Bed Mobility               General bed mobility comments: Pt. sitting upright in recliner chair upon arrival.    Transfers                   General transfer comment: Deferred     Balance                                           ADL either performed or assessed with clinical  judgement   ADL               Lower Body Bathing: Maximal assistance       Lower Body Dressing: Maximal assistance                      Extremity/Trunk Assessment Upper Extremity Assessment Upper Extremity Assessment: Generalized weakness            Vision Baseline Vision/History: 1 Wears glasses Patient Visual Report: No change from baseline     Perception     Praxis      Cognition Arousal/Alertness: Awake/alert Behavior During Therapy: WFL for tasks assessed/performed Overall Cognitive Status: Within Functional Limits for tasks assessed                                          Exercises      Shoulder Instructions       General Comments      Pertinent Vitals/ Pain       Pain Assessment Pain Score: 6  Pain Location: mid and lower Back Pain Descriptors / Indicators: Aching, Discomfort Pain Intervention(s): Monitored during session, Repositioned  Home Living  Prior Functioning/Environment              Frequency  Min 2X/week        Progress Toward Goals  OT Goals(current goals can now be found in the care plan section)  Progress towards OT goals: Progressing toward goals  Acute Rehab OT Goals OT Goal Formulation: With patient Time For Goal Achievement: 03/21/22 Potential to Achieve Goals: Good  Plan Discharge plan remains appropriate    Co-evaluation                 AM-PAC OT "6 Clicks" Daily Activity     Outcome Measure   Help from another person eating meals?: A Little Help from another person taking care of personal grooming?: A Little Help from another person toileting, which includes using toliet, bedpan, or urinal?: A Little Help from another person bathing (including washing, rinsing, drying)?: A Lot Help from another person to put on and taking off regular upper body clothing?: A Lot Help from another person to put on and taking  off regular lower body clothing?: A Lot 6 Click Score: 15    End of Session    OT Visit Diagnosis: Muscle weakness (generalized) (M62.81);Other abnormalities of gait and mobility (R26.89)   Activity Tolerance Patient tolerated treatment well   Patient Left in chair;with call bell/phone within reach;with nursing/sitter in room   Nurse Communication Mobility status        Time: 1325-1346 OT Time Calculation (min): 21 min  Charges: OT General Charges $OT Visit: 1 Visit OT Treatments $Self Care/Home Management : 8-22 mins  Harrel Carina, MS, OTR/L   Harrel Carina 03/11/2022, 4:51 PM

## 2022-03-11 NOTE — Progress Notes (Signed)
Mobility Specialist - Progress Note   03/11/22 1600  Mobility  Activity Ambulated with assistance in room;Transferred from chair to bed  Level of Assistance +2 (takes two people)  Assistive Device Front wheel walker  Distance Ambulated (ft) 3 ft  Activity Response Tolerated well  $Mobility charge 1 Mobility     Pt sitting in recliner upon arrival, utilizing 3L. Pt able to stand from recliner with modA +2. Extensive time needed to complete STS and transfer but with slightly lighter assist needed this session as compared to B-C transfer this AM. Possible that pt does better transferring to the R than to the L. Flexed posture with forearms resting on RW. MaxA to return supine. HR 124 bpm with O2 maintaining > 88%. Pt left in bed with alarm set, needs in reach.    Kathee Delton Mobility Specialist 03/11/22, 4:22 PM

## 2022-03-11 NOTE — Progress Notes (Signed)
Mobility Specialist - Progress Note    03/11/22 1107  Mobility  Activity Stood at bedside;Dangled on edge of bed;Ambulated with assistance in room  Level of Assistance +2 (takes two people)  Games developer wheel walker  Distance Ambulated (ft) 3 ft  Activity Response Tolerated well  $Mobility charge 1 Mobility     During mobility on 2.5L: 128 HR, 84-87% SpO2 During mobility on 3.5L: 92%   Pt semi supine upon arrival on 2.5L [place]. Completes all activities and transfers with MaxA +2. Bed mobility with MaxA and heavy use of rails, needed assist to scoot towards EOB. Complaints of back pain and chest discomfort, RN notified immediately -- subsides after seated rest break. O2 bumped to 3.5L for OOB activity due to pt destating at 84% on 2.5L. STS  from elevated bed height, rocking motion for momentum to initiate stand. Forward flex posture w/ forearms resting on RW. Heavy vc's for sequencing to ambulate to chair. Descends safely to chair and left with PT Robert Bellow present for continued therex.  Merrily Brittle Mobility Specialist 03/11/22, 11:23 AM

## 2022-03-12 ENCOUNTER — Inpatient Hospital Stay: Payer: Medicare Other

## 2022-03-12 DIAGNOSIS — I5023 Acute on chronic systolic (congestive) heart failure: Secondary | ICD-10-CM | POA: Diagnosis not present

## 2022-03-12 DIAGNOSIS — G4733 Obstructive sleep apnea (adult) (pediatric): Secondary | ICD-10-CM | POA: Diagnosis not present

## 2022-03-12 DIAGNOSIS — J9621 Acute and chronic respiratory failure with hypoxia: Secondary | ICD-10-CM | POA: Diagnosis not present

## 2022-03-12 LAB — BASIC METABOLIC PANEL
Anion gap: 9 (ref 5–15)
BUN: 24 mg/dL — ABNORMAL HIGH (ref 8–23)
CO2: 37 mmol/L — ABNORMAL HIGH (ref 22–32)
Calcium: 8.2 mg/dL — ABNORMAL LOW (ref 8.9–10.3)
Chloride: 95 mmol/L — ABNORMAL LOW (ref 98–111)
Creatinine, Ser: 0.89 mg/dL (ref 0.44–1.00)
GFR, Estimated: >60 mL/min (ref >60)
Glucose, Bld: 89 mg/dL (ref 70–99)
Potassium: 3.8 mmol/L (ref 3.5–5.1)
Sodium: 141 mmol/L (ref 135–145)

## 2022-03-12 LAB — GLUCOSE, CAPILLARY
Glucose-Capillary: 100 mg/dL — ABNORMAL HIGH (ref 70–99)
Glucose-Capillary: 102 mg/dL — ABNORMAL HIGH (ref 70–99)
Glucose-Capillary: 105 mg/dL — ABNORMAL HIGH (ref 70–99)
Glucose-Capillary: 115 mg/dL — ABNORMAL HIGH (ref 70–99)
Glucose-Capillary: 130 mg/dL — ABNORMAL HIGH (ref 70–99)
Glucose-Capillary: 88 mg/dL (ref 70–99)

## 2022-03-12 NOTE — TOC Progression Note (Addendum)
Transition of Care [facility]) - Progression Note    Patient Details  Name: Madison Mcbride MRN: [id number] Date of Birth: 07/24/1955  [facility]) CM/SW [place], LCSW Phone Number: 03/12/2022, 10:31 AM  Clinical Narrative:   PT now recommending SNF placement. Went by room to discuss with patient but mobility tech working with her. Will try again later.  11:29 pm: Spoke with patient and husband about SNF recommendation. Patient prefers to return home with home health. TOC coworker set her up with [facility] over the weekend. Also following for home oxygen needs. She uses 2 L prn at home.  Expected Discharge Plan: [place] Barriers to Discharge: Continued Medical Work up  Expected Discharge Plan and Services Expected Discharge Plan: [place]   Discharge Planning Services: CM Consult Post Acute Care Choice: [place] arrangements for the past 2 months: Single Family Home                 DME Arranged: N/A DME Agency: NA       HH Arranged: RN, PT, OT HH Agency: [place] Date [place]: 03/07/22 Time [place]: 1624     Social Determinants of Health (SDOH) Interventions    Readmission Risk Interventions    03/07/2022    4:12 PM  Readmission Risk Prevention Plan  Transportation Screening Complete  PCP or Specialist Appt within 5-7 Days Complete  Home Care Screening Complete  Medication Review (RN CM) Complete

## 2022-03-12 NOTE — Progress Notes (Signed)
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   Patient was admitted to ICU with Acute Hypoxic Hypercapnic Respiratory Failure secondary to CHF decompensation requiring BiPAP, along with severe Sepsis with septic shock in setting of UTI vs ?cellulitis of bilateral lower extremities vs stasis dermatitis due to peripheral edema.  She has open superficial wound on each lower leg from fluid weeping which formed blisters.  Stabilized and patient transferred to hospitalist service on 8/27.    Since then, patient has been receiving diuresis over 6.5 L.  Over the past 2 days, patient's oxygen requirement slightly increased.  And on 8/29, patient noted to have low-grade fever.  Had episode of brief tachycardia and ABG done noted worsening hypercarbia and hypoxia.  Patient placed on BiPAP.  By 8/30, doing well and able to maintain on 3 L nasal cannula.    Assessment and Plan: Assessment and Plan: * Acute on chronic systolic CHF (congestive heart failure) ([place]) Presented with progressive dyspnea, hypoxia, lower extremity edema, BNP 955 and CXR with cardiomegaly and central vascular congestion with mild interstitial edema.  Initially diuretics held due to hypotension and shock. Lasix started once patient's blood pressure stabilized.  Lasix increased to 40 twice daily on 8/29.  And he has since diuresed almost 12 L and is more than -8.5 L deficient.   Home torsemide and metolazone on hold.  Given morbid obesity, BNP is likely significantly likely underestimating degree of heart failure.  Acute on chronic respiratory failure with hypoxia and hypercapnia (HCC) At baseline, patient uses trilogy  NIV at home.  Presented with worsening shortness of breath and hypoxia requiring BiPAP.  Supplemental O2 to maintain sats 88 to 93%.  In review of patient's vitals, oxygen levels in the last day have required 4 to 5 L, but oxygen saturations have been higher than needed, greater than 95% which may be precipitating cause for acute respiratory failure and hypercarbia currently.  Patient changed over to BiPAP.  Able to be weaned off of BiPAP during the day by 8/30.  PCO2 notes improvement and patient breathing more comfortably.  Currently on 3 L and weaning down.  Cleared by pulmonary.  Shock (HCC)-resolved as of 03/10/2022 Septic versus cardiogenic shock -resolved. Briefly required pressors on admission to ICU. Now off pressors. -- Monitor BP closely -- Treat CHF and infection as outlined -- Maintain MAP over 65 -- Home Coreg, torsemide and ARB are on hold, resume when BP will tolerate  Severe sepsis (HCC)-resolved as of 03/10/2022 .Patient met SIRS criteria on admission with leukocytosis and tachypnea in setting of suspected UTI versus cellulitis.  Unclear if shock is cardiogenic or septic in nature, cannot rule out septic shock but clinically undetermined.  Sepsis itself stabilized. -- Sepsis physiology improved which was de-escalated to Rocephin. Procalcitonin and white blood cell count continue to trend downward  Lymphedema of both lower extremities With increased swelling to the point of weeping and blister formation from fluid, erythema bilaterally consistent with stasis dermatitis and lymphedema.  Will continue antibiotic coverage given open skin wounds. -- De-escalated antibiotics to Rocephin.  Seen by wound care and checking ABIs. -- Continue diuresis  Acute metabolic encephalopathy-resolved  as of 03/10/2022 POA, likely due to hypoxia and infection.  Hypercarbia may contribute but pH is compensated.  Ammonia level normal.  No focal neurologic deficits.   ICU environment contributes.   Avoiding sedating medications.  Has been feels close to baseline and should improve even further with treatment of worsening hypercarbia.  PAF (paroxysmal atrial fibrillation) (HCC) Continue Eliquis.  Coreg on hold until BP improves.  Monitor on telemetry.  Had brief episode on 8/29 due to worsening respiratory failure.  Acute kidney injury in the setting of stage 3a chronic kidney disease ([place]) AKI superimposed on CKD IIIa Presented with creatinine 1.97 in the setting of decompensated CHF and volume overload, probable infection, likely prerenal AKI.  Renal function close to baseline.  Monitor while aggressively diuresing.  OSA (obstructive sleep apnea) Uses trilogy/NIV at home.   BiPAP at night  Elevated troponin Mild and flat trend.  Likely demand ischemia in the setting of decompensated CHF and shock.  Patient is chest pain-free and EKG non-acute.  Type 2 diabetes mellitus with diabetic neuropathy, without long-term current use of insulin (HCC) Continue sliding scale NovoLog  Benign essential hypertension Presented in shock requiring vasopressors.  BP has stabilized but home antihypertensives are still on hold.  Resume when BP will tolerate.  Hypothyroidism Continue Synthroid  HLD (hyperlipidemia) Continue statin  Hypokalemia-resolved as of 03/10/2022 Replacing oral KCl for K3.3 this morning.  Monitor BMP, replace K as needed.  Morbid obesity with BMI of 50.0-59.9, adult (HCC) Body mass index is 58.31 kg/m. Complicates overall care and prognosis.       Body mass index is 57.94 kg/m.        Consultants: Critical care  Procedures: Echocardiogram done 8/25: Preserved diastolic function.  Ejection fraction 45% ABIs pending  Antimicrobials: IV Rocephin 8/27-present IV cefepime and vancomycin 8/25 - 8/26   Code Status: Full code   Subjective: Patient with no complaints, breathing much easier  Objective: Noted hypoxia Vitals:   03/12/22 1156 03/12/22 1628   BP: 119/64 125/65  Pulse: 80 86  Resp: 16 12  Temp: 98.3 F (36.8 C) 98.4 F (36.9 C)  SpO2: 96% 100%    Intake/Output Summary (Last 24 hours) at 03/12/2022 1723 Last data filed at 03/12/2022 1500 Gross per 24 hour  Intake 720 ml  Output 2460 ml  Net -1740 ml    Filed Weights   03/09/22 0420 03/10/22 0414 03/12/22 0343  Weight: (!) 154.1 kg (!) 152.4 kg (!) 153.1 kg   Body mass index is 57.94 kg/m.  Exam:  General: Alert and oriented x3, no acute distress HEENT: Normocephalic, atraumatic, narrow airway Cardiovascular: Irregular rhythm, rate controlled Respiratory: Decreased breath sounds throughout secondary to body habitus Abdomen: Soft, obese, nontender, positive bowel sounds Musculoskeletal: No clubbing or cyanosis, 1-2+ pitting edema Skin: No breaks, tears or lesion, bilateral erythema Psychiatry: Appropriate, no evidence of psychoses and Neurology: Focal deficits  Data Reviewed: Full diuresis  Disposition:  Status is: Inpatient Remains inpatient appropriate because: Stabilization of respiratory function and fully diuresed PT and OT recommending skilled nursing, which patient is considering  Anticipated discharge date: 9/2  Family Communication: Left message for husband DVT Prophylaxis:  apixaban (ELIQUIS) tablet 5 mg   I have spent 35 minutes in the care of this critically ill patient including review of patient's labs and films, medical decision making and bedside examination.  Author: Annita Brod ,MD 03/12/2022 5:23 PM  To reach On-call, see care teams to locate the attending and reach out via  [email or web address] Between 7PM-7AM, please contact night-coverage If you still have difficulty reaching the attending provider, please page the [facility] (Director on Call) for [facility] on amion for assistance.

## 2022-03-12 NOTE — Progress Notes (Signed)
Occupational Therapy Treatment Patient Details Name: Madison Mcbride MRN: [id number] DOB: 04-24-1956 Today's Date: 03/12/2022   History of present illness Pt is a 66 yo female that presented to the ED for SOB, weakness. Admitted to ICU for acaute on chronic hypoxic hypercapnic respiratory failure, sepsis due to wound infection.  PMH of COPD, CHF, DM, lymphedema, thyroid cancer, sleep apnea, afib, RLS.   OT comments  Chart reviewed, pt greeted in chair, agreeable to OT tx session. Tx session targeted task oriented training to improve functional transfer independence. Pt required MOD A+2 for STS from chair, MIN-MOD A for step pivot transfer to bed with RW +2, step by step vcs for technique. Pt performed grooming tasks seated on edge of bed with SET UP. Good static sitting balance throughout. MAX A required for sit>supine, pt able to boost self in bed with MIN A.Pt requires increased assist for ADL/functional mobility. Vital signs stable throughout. Discharge recommendation has been updated to STR. Pt is left in bed, all needs met. OT will follow acutely.    Recommendations for follow up therapy are one component of a multi-disciplinary discharge planning process, led by the attending physician.  Recommendations may be updated based on patient status, additional functional criteria and insurance authorization.    Follow Up Recommendations  Skilled nursing-short term rehab (<3 hours/day)    Assistance Recommended at Discharge Intermittent Supervision/Assistance  Patient can return home with the following  A lot of help with walking and/or transfers;A lot of help with bathing/dressing/bathroom   Equipment Recommendations  BSC/3in1    Recommendations for Other Services      Precautions / Restrictions Precautions Precautions: Fall Restrictions Weight Bearing Restrictions: No       Mobility Bed Mobility Overal bed mobility: Needs Assistance Bed Mobility: Sit to Supine       Sit  to supine: Max assist        Transfers Overall transfer level: Needs assistance Equipment used: Rolling walker (2 wheels) Transfers: Sit to/from Stand Sit to Stand: Mod assist, +2 physical assistance                 Balance Overall balance assessment: Needs assistance Sitting-balance support: Feet supported Sitting balance-Leahy Scale: Good     Standing balance support: Bilateral upper extremity supported, During functional activity, Reliant on assistive device for balance Standing balance-Leahy Scale: Poor Standing balance comment: heavy lean on RW, vcs for technique, sequencing                           ADL either performed or assessed with clinical judgement   ADL Overall ADL's : Needs assistance/impaired                                       General ADL Comments: SET UP for grooming tasks seated at edge of bed    Extremity/Trunk Assessment              Vision       Perception     Praxis      Cognition Arousal/Alertness: Awake/alert Behavior During Therapy: WFL for tasks assessed/performed Overall Cognitive Status: Within Functional Limits for tasks assessed  Exercises      Shoulder Instructions       General Comments      Pertinent Vitals/ Pain       Pain Assessment Pain Assessment: Faces Faces Pain Scale: Hurts a little bit Pain Location: left lower leg, stinging Pain Descriptors / Indicators: Burning Pain Intervention(s): Limited activity within patient's tolerance, Monitored during session, Repositioned, Other (comment) (RN notified)  Home Living                                          Prior Functioning/Environment              Frequency  Min 2X/week        Progress Toward Goals  OT Goals(current goals can now be found in the care plan section)  Progress towards OT goals: Progressing toward goals     Plan  Discharge plan needs to be updated    Co-evaluation                 AM-PAC OT "6 Clicks" Daily Activity     Outcome Measure   Help from another person eating meals?: A Little Help from another person taking care of personal grooming?: A Little   Help from another person bathing (including washing, rinsing, drying)?: A Lot Help from another person to put on and taking off regular upper body clothing?: A Lot Help from another person to put on and taking off regular lower body clothing?: A Lot 6 Click Score: 12    End of Session Equipment Utilized During Treatment: Oxygen  OT Visit Diagnosis: Muscle weakness (generalized) (M62.81);Other abnormalities of gait and mobility (R26.89)   Activity Tolerance Patient tolerated treatment well   Patient Left in bed;with call bell/phone within reach;with bed alarm set   Nurse Communication Mobility status        Time: 4098-1191 OT Time Calculation (min): 17 min  Charges: OT General Charges $OT Visit: 1 Visit OT Treatments $Therapeutic Activity: 8-22 mins  Shanon Payor, OTD OTR/L  03/12/22, 3:52 PM

## 2022-03-12 NOTE — Progress Notes (Signed)
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   HPI  66 y.o female with significant PMH of HFpEF (LVEF 45-50% 03/2021, OSA on CPAP, HLD, T2DM, Hypothyroidism, Afib, RLS, Thyroid cancer and chronic Lymphedema who presented to the ED with chief complaints of worsening shortness of breath.  Per patient's husband who is at the bedside, patient has required increased oxygenation for the past few days due to persistent hypoxia in the 60's and activity intolerance.  ED Course: In the emergency department, vital signs showeD blood pressure of 109/55 mm Hg, HR of 55 beats per minute, a respiratory rate of 12 breaths per minute, and a temperature of 98.46F (36.7C) sats  100% on NRB   03/11/22- patient seen and examined.  She is on supplemental O2 on 3.5L/min [place] with bipap nocturnally.  She is 7.5L net negative however still edematous.  She is complaining of neck and back pain.  She shares this is worse then previous .She did PT and developed worsening back pain with tachyarrythmia but after resting improved with NSR.  We reivewed lab work together and renal function is improved, ive added aldactone today to further diurese her as there is still crackles on auscultation bilaterally and LE edema.   03/12/22- patient is improved, she made 3L of urine in past 24h.  Her back pain is better on stronger opiates.  Renal function has further improved despite increased diuresis.  PT/OT is ongoing, I have encouraged her to  participate strongly with PT/OT. She is cleared for DC planning.  She is close to baseline now.   Past Medical History   HFpEF (LVEF 45-50% 03/2021, OSA on CPAP, HLD, T2DM, Hypothyroidism, RLS, Thyroid cancer and chronic Lymphedema  Significant Hospital Events   8/25: Admitted to ICU with Acute on Chronic hypoxic hypercapnic respiratory failure 8/26: Weaned off pressors overnight. Infectious markers improving, renal function improving, start diuresis  Consults:  None  Procedures:  None  Significant Diagnostic Tests:  8/24: Chest Xray>Cardiomegaly with central vascular prominence and mild interstitial Edema 8/25: Venous US BLE>>: No  lower extremity DVT. 8/25: Echocardiogram>> 1. Left ventricular ejection fraction, by estimation, is 45 to 50%. The  left ventricle has mildly decreased function. The left ventricle has no  regional wall motion abnormalities. Left ventricular diastolic parameters  were normal.   2. Right ventricular systolic function is normal. The right ventricular  size is normal.   3. The mitral valve is normal in structure. Mild to moderate mitral valve  regurgitation. No evidence of mitral stenosis.   4. Tricuspid valve regurgitation is mild to moderate.   5. The aortic valve is normal in structure. Aortic valve regurgitation is moderate. No aortic stenosis is present.   6. The inferior vena cava is normal in size with greater than 50%  respiratory variability, suggesting right atrial pressure of 3 mmHg.   Micro Data:  8/24: Blood culture x2> NGTD 8/24: Urine Culture> 8/24: MRSA PCR>> negative 8/24: Strep pneumo urinary antigen>negative 8/24: Legionella urinary antigen>  Antimicrobials:  Vancomycin 8/24> Cefepime 8/24 >  OBJECTIVE  Blood pressure 119/64, pulse 80, temperature 98.3 F (36.8 C), temperature source Oral, resp. rate 16, height '5\' 4"'$  (1.626 m), weight (!) 153.1 kg, SpO2 96 %.        Intake/Output Summary (Last 24 hours) at 03/12/2022  1226 Last data filed at 03/12/2022 1100 Gross per 24 hour  Intake 480 ml  Output 2210 ml  Net -1730 ml    Filed Weights   03/09/22 0420 03/10/22 0414 03/12/22 0343  Weight: (!) 154.1 kg (!) 152.4 kg (!) 153.1 kg   Physical Examination  GENERAL: 66  year-old acute on chronically ill appearing patient, lying in the bed on [place].  EYES: Pupils equal, round, reactive to light and accommodation. No scleral icterus. Extraocular muscles intact.  HEENT: Head atraumatic, normocephalic. Oropharynx and nasopharynx clear.  NECK:  Supple, no jugular venous distention. No thyroid enlargement, no tenderness.  LUNGS: Normal breath sounds bilaterally, no wheezing, rales,rhonchi or crepitation. No use of accessory muscles of respiration.  CARDIOVASCULAR: S1, S2 normal. No murmurs, rubs, or gallops.  ABDOMEN: Soft, nontender,  OBESE, distended. Bowel sounds present. No organomegaly or mass.  EXTREMITIES:SEE BELOW NEUROLOGIC: Cranial nerves II through XII are intact.  Muscle strength 5/5 in all extremities. Sensation intact. Gait not checked.  PSYCHIATRIC: The patient is alert and oriented x 3.  SKIN:SEE BELOW     Labs/imaging that I havepersonally reviewed  (right click and "Reselect all SmartList Selections" daily)     Labs   CBC: Recent Labs  Lab 03/05/22 1743 03/06/22 0404 03/07/22 0545 03/08/22 0454 03/09/22 0430 03/10/22 0529 03/11/22 0413  WBC 14.5*   < > 9.7 10.5 10.7* 12.9* 11.2*  NEUTROABS 11.7*  --   --   --   --   --   --   HGB 9.9*   < > 9.4* 9.3* 8.9* 8.9* 8.5*  HCT 36.4   < > 34.5* 34.0* 32.4* 32.6* 31.8*  MCV 88.3   < > 85.2 85.4 86.4 86.0 86.9  PLT 343   < > 273 265 240 267 277   < > = values in this interval not displayed.     Basic Metabolic Panel: Recent Labs  Lab 03/06/22 0404 03/07/22 0545 03/08/22 0454 03/09/22 0430 03/10/22 0529 03/11/22 0413 03/12/22 0631  NA 142 142 143 143 144 142 141  K 5.0 3.6 3.4* 3.3* 3.7 3.7 3.8  CL 99 101 100 98 99 96* 95*   CO2 34* 33* 36* 37* 37* 36* 37*  GLUCOSE 117* 83 108* 113* 125* 84 89  BUN 65* 52* 42* 31* 25* 24* 24*  CREATININE 2.00* 1.48* 1.28* 1.03* 1.01* 1.05* 0.89  CALCIUM 8.2* 8.3* 8.5* 8.4* 8.6* 8.5* 8.2*  MG 2.3 2.2 2.2 2.2 2.1  --   --   PHOS 6.3* 4.6 3.4  --  2.9  --   --     GFR: Estimated Creatinine Clearance: 91.1 mL/min (by C-G formula based on SCr of 0.89 mg/dL). Recent Labs  Lab 03/05/22 2140 03/06/22 0404 03/07/22 0545 03/08/22 0454 03/09/22 0430 03/10/22 0529 03/10/22 1442 03/11/22 0413  PROCALCITON  --   --  0.22 0.10  --   --  0.22 0.19  WBC  --  16.0* 9.7 10.5 10.7* 12.9*  --  11.2*  LATICACIDVEN 1.0 0.9  --   --   --   --  1.2  --      Liver Function Tests: Recent Labs  Lab 03/05/22 1743  AST 25  ALT 14  ALKPHOS 92  BILITOT 1.5*  PROT 7.1  ALBUMIN 3.1*    No results for input(s): "LIPASE", "AMYLASE" in the last 168 hours. Recent Labs  Lab 03/08/22 1000  AMMONIA 25     ABG    Component Value Date/Time   PHART 7.43 03/11/2022 0459   PCO2ART 67 ([place]) 03/11/2022 0459   PO2ART 84 03/11/2022 0459   HCO3 44.5 (H) 03/11/2022 0459   O2SAT 97.6 03/11/2022 0459     Coagulation Profile: No results for input(s): "INR", "PROTIME" in the last 168 hours.  Cardiac Enzymes: No results for input(s): "CKTOTAL", "CKMB", "CKMBINDEX", "TROPONINI" in the last 168 hours.  HbA1C: Hgb A1c MFr Bld  Date/Time Value Ref Range Status  03/06/2022 04:04 AM 6.4 (H) 4.8 - 5.6 % Final    Comment:    (NOTE) Pre diabetes:          5.7%-6.4%  Diabetes:              >6.4%  Glycemic control for   <7.0% adults with diabetes   03/15/2021 05:27 AM 6.3 (H) 4.8 - 5.6 % Final    Comment:    (NOTE) Pre diabetes:          5.7%-6.4%  Diabetes:              >6.4%  Glycemic control for   <7.0% adults with diabetes     CBG: Recent Labs  Lab 03/11/22 2008 03/12/22 0013 03/12/22 0350 03/12/22 0749 03/12/22 1159  GLUCAP 120* 100* 102* 88 105*     Review of  Systems:   Positives in BOLD: Gen: Denies fever, chills, weight change, fatigue, night sweats HEENT: Denies blurred vision, double vision, hearing loss, tinnitus, sinus congestion, rhinorrhea, sore throat, neck stiffness, dysphagia PULM: Denies shortness of breath, cough, sputum production, hemoptysis, wheezing CV: Denies chest pain, edema, orthopnea, paroxysmal nocturnal dyspnea, palpitations GI: Denies abdominal pain, nausea, vomiting, diarrhea, hematochezia, melena, constipation, change in bowel habits GU: Denies dysuria, hematuria, polyuria, oliguria, urethral discharge Endocrine: Denies hot or cold intolerance, polyuria, polyphagia or appetite change Derm: Denies rash, dry skin, scaling or peeling skin change Heme: Denies easy bruising, bleeding, bleeding gums Neuro: Denies headache, numbness, weakness, slurred speech, loss of memory or consciousness   Past Medical History  She,  has a past medical history of CHF (congestive heart failure) ([place]), Diabetes mellitus without complication ([place]), Hypothyroidism, Lymphedema, RLS (restless legs syndrome), Sleep apnea, and Thyroid cancer ([place]) (2007).   Surgical History    Past Surgical History:  Procedure Laterality Date   ABDOMINAL HYSTERECTOMY     COLONOSCOPY WITH PROPOFOL N/A 08/26/2015   Procedure: COLONOSCOPY WITH PROPOFOL;  Surgeon: Manya Silvas, MD;  Location: [facility] ENDOSCOPY;  Service: Endoscopy;  Laterality: N/A;   THYROIDECTOMY       Social History   reports that she has never smoked. She has never used smokeless tobacco. She reports that she does not drink alcohol and does not use drugs.   Family History   Her family history is negative for Breast cancer.   Allergies No Known Allergies   Home Medications  Prior to Admission medications   Medication Sig Start Date End Date Taking? Authorizing Provider  apixaban (ELIQUIS) 5 MG TABS tablet Take 1 tablet (5 mg total) by mouth 2 (two) times daily. 07/25/21  Yes Dessa Phi, DO  atorvastatin (LIPITOR) 20 MG tablet Take 20 mg by mouth daily. 01/31/20  Yes [provider]  Biotin  2.5 MG CAPS Take 2.5 mg by mouth daily.    Yes [provider]  buPROPion (WELLBUTRIN XL) 150 MG 24 hr tablet Take 150 mg by mouth daily. 01/10/21  Yes [provider]  carvedilol (COREG) 3.125 MG tablet Take 1 tablet (3.125 mg total) by mouth 2 (two) times daily with a meal. 07/28/21  Yes Dessa Phi, DO  celecoxib (CELEBREX) 200 MG capsule Take 200 mg by mouth 2 (two) times daily. 02/10/22  Yes [provider]  Cholecalciferol 25 MCG (1000 UT) tablet Take 1 tablet by mouth daily.   Yes [provider]  gabapentin (NEURONTIN) 300 MG capsule Take 300 mg by mouth 3 (three) times daily. 02/09/22  Yes [provider]  levothyroxine (SYNTHROID) 125 MCG tablet Take 1 tablet (125 mcg total) by mouth daily. 07/26/21  Yes Dessa Phi, DO  Multiple Vitamin (MULTIVITAMIN) tablet Take 1 tablet by mouth daily.   Yes [provider]  pantoprazole (PROTONIX) 40 MG tablet Take 40 mg by mouth daily. 01/11/21  Yes [provider]  potassium chloride SA (KLOR-CON M) 20 MEQ tablet Take 20 mEq by mouth daily. 12/15/21  Yes [provider]  propranolol (INDERAL) 10 MG tablet Take 20 mg by mouth 4 (four) times daily.   Yes [provider]  rOPINIRole (REQUIP) 3 MG tablet Take 3 mg by mouth 3 (three) times daily. 01/20/18  Yes [provider]  Semaglutide, 1 MG/DOSE, 2 MG/1.5ML SOPN Inject 0.75 mLs into the skin once a week. 10/14/20  Yes [provider]  torsemide (DEMADEX) 20 MG tablet Take 2 tablets (40 mg total) by mouth daily. 04/14/21  Yes Darylene Price A, FNP  zinc gluconate 50 MG tablet Take 50 mg by mouth daily.   Yes [provider]  Scheduled Meds:  apixaban  5 mg Oral BID   atorvastatin  20 mg Oral Daily   buPROPion  150 mg Oral Daily   furosemide  40 mg Intravenous BID   gabapentin  200  mg Oral BID   gabapentin  300 mg Oral QHS   insulin aspart  0-9 Units Subcutaneous Q4H   ipratropium-albuterol  3 mL Nebulization BID   levothyroxine  125 mcg Oral Q0600   pantoprazole  40 mg Oral Daily   rOPINIRole  2 mg Oral BID   rOPINIRole  3 mg Oral QHS   spironolactone  25 mg Oral Daily   Continuous Infusions:   PRN Meds:.acetaminophen, ALPRAZolam, docusate sodium, ipratropium-albuterol, mouth rinse, oxyCODONE-acetaminophen, polyethylene glycol   Active Hospital Problem list     Assessment & Plan:   Acute Hypoxic Hypercapnic Respiratory Failure secondary to CHF exacerbation  OSA on CPAP COPD without evidence of acute exacerbation -Supplemental O2 as needed to maintain O2 sats 88 to 92% -BiPAP qhs and as needed -Follow intermittent Chest X-ray & ABG as needed -Bronchodilators & Pulmicort nebs -Diuresis as BP and renal function permits ~ Start Lasix IV 20 mg BID -Aggressive Pulmonary toilet as able -Mobilize as able, consult PT/OT   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%) BNP 955 on admission Shock: Septic +/- Cardiogenic ~ RESOLVED Mildly elevated Troponin, suspect demand ischemia PMHx: HTN, HLD, Atrial fibrillation Echocardiogram 03/06/22: LVEF 35-36%, normal diastolic parameters, RV systolic function normal, mild to moderate MR, mild to moderate TR -Continuous cardiac monitoring -Maintain MAP >65 -Vasopressors as needed to maintain MAP goal ~ weaned off since evening of 8/25 -Lactic acid has normalized -Trend HS Troponin until peaked (16 ~ 29 ~ ) -Cortisol  normal 14.8 -Diuresis as BP and renal function permits ~ Start 20 mg IV Lasix BID now that AKI improving -Consider resuming home carvedilol 6.'25mg'$  BID, torsemine '40mg'$   -Continue Eliquis and Atorvastatin   Severe Sepsis in the setting of suspected Cellulitis of bilateral lower extremities & questionable UTI PMHx: chronic bilateral venous stasis Meets SIRS Criteria  -Monitor fever curve -Trend WBC's &  Procalcitonin -Follow cultures as above -Continue empiric Cefepime & Vancomycin pending cultures & sensitivities -Wound care   AKI on CKD Stage III ~ IMPROVING -Monitor I&O's / urinary output -Follow BMP -Ensure adequate renal perfusion -Avoid nephrotoxic agents as able -Replace electrolytes as indicated -Pharmacy following for assistance with electrolyte replacement   Diabetes Mellitus Type II Hgb A1c is 6.4 -CBG's AC & qhs; Target range of 140 to 180 -SSI -Follow ICU Hypo/Hyperglycemia protocol   Hypothyroidism -Continue home Synthroid -TSH and thyroid panel pending     Best practice:  Diet:  Regular Pain/Anxiety/Delirium protocol (if indicated): No VAP protocol (if indicated): Not indicated DVT prophylaxis: DOAC GI prophylaxis: PPI Glucose control:  SSI Yes Central venous access:  N/A Arterial line:  N/A Foley:  N/A Mobility:  OOB as tolerated  PT consulted: yes Last date of multidisciplinary goals of care discussion [8/25] Code Status:  full code Disposition: ICU     Ottie Glazier, M.D.  Pulmonary & [place]

## 2022-03-12 NOTE — Progress Notes (Signed)
Patient placed on BIPAP for HS. Tolerating well at this time, skin barrier gel pad placed over bridge of nose for protection. RN at bedside, all scheduled PO Med's administered.

## 2022-03-12 NOTE — Care Management Important Message (Signed)
Important Message  Patient Details  Name: Madison Mcbride MRN: [id number] Date of Birth: [birth year]   Medicare Important Message Given:  Yes     Dannette Barbara 03/12/2022, 2:52 PM

## 2022-03-12 NOTE — Plan of Care (Signed)
  Problem: Health Behavior/Discharge Planning: Goal: Ability to manage health-related needs will improve Outcome: Progressing   Problem: Clinical Measurements: Goal: Respiratory complications will improve Outcome: Progressing   Problem: Clinical Measurements: Goal: Cardiovascular complication will be avoided Outcome: Progressing   Problem: Elimination: Goal: Will not experience complications related to urinary retention Outcome: Progressing   Problem: Pain Managment: Goal: General experience of comfort will improve Outcome: Progressing   Problem: Safety: Goal: Ability to remain free from injury will improve Outcome: Progressing   

## 2022-03-12 NOTE — Consult Note (Signed)
[facility] Nurse Consult Note: Reason for Consult:LE wounds  Patient with history of CHF, COPD, OSA and LE edema, presenting with increasing SOB and weeping from the LEs  Wound type: venous stasis with partial and full thickness ulcerations  Pressure Injury POA: NA Measurement:see nursing flow sheets Wound bed: Left pretibial, partially unroofed blister, with pink, moist skin  Right lateral; full thickness skin loss distally with pink, pale wound bed Scattered scabbed lesions over the bilateral LEs Drainage (amount, consistency, odor) see nursing flow sheets Periwound: redness; edema  Dressing procedure/placement/frequency: Cleanse bilateral LE wounds with saline, pat dry Cover bilateral LE wounds with cut to fit piece of xeroform gauze, top with foam Wrap with Kerlix from toes to knees and secure with 4" ACE wraps from toes to knees Elevate legs as much as possible.  Change every other day  Consider ABI for compression safety, once we determine normal results may add therapeutic compression with Unna's boots bilaterally along with above topical wound care.  Will need follow up for compression therapy and topical care either via [facility] or wound care center of patient's choice for follow up and long term maintenance of LE edema.  Will follow for ABI results over the next couple of days   Thanks  [employer] MSN, RN,CWOCN, CNS, CWON-AP [phone])

## 2022-03-12 NOTE — Progress Notes (Signed)
Mobility Specialist - Progress Note   03/12/22 1100  Mobility  Activity Ambulated with assistance in room;Transferred to/from [facility]  Level of Assistance +2 (takes two people)  Games developer wheel walker  Distance Ambulated (ft) 3 ft  Activity Response Tolerated well  $Mobility charge 1 Mobility    During mobility: 123 HR, 84-91% SpO2   Pt lying in bed upon arrival, utilizing 3L. Denied back pain Pt able to come into sitting with maxA +2. O2 desat to 85% with EOB transfer. Rest break taken. Pt able to come into standing with minA +2 from elevated bed height. Pt performed all transfers from R to L this date with much better performance than previous sessions. VC for sequencing task. Assist for controlled sitting. Pt had large BM with assist for peri-care d/t need for BUE support. Pt left in recliner with alarm set, needs in reach.    Kathee Delton Mobility Specialist 03/12/22, 12:02 PM

## 2022-03-12 NOTE — Progress Notes (Signed)
   03/11/22 2000  Assess: MEWS Score  Temp 98.2 F (36.8 C)  BP 116/70  MAP (mmHg) 82  ECG Heart Rate (!) 138  Resp 20  SpO2 93 %  O2 Device Nasal Cannula  O2 Flow Rate (L/min) 2 L/min  Assess: MEWS Score  MEWS Temp 0  MEWS Systolic 0  MEWS Pulse 3  MEWS RR 0  MEWS LOC 0  MEWS Score 3  MEWS Score Color Yellow  Assess: if the MEWS score is Yellow or Red  Were vital signs taken at a resting state? Yes  Focused Assessment No change from prior assessment  Does the patient meet 2 or more of the SIRS criteria? Yes  Does the patient have a confirmed or suspected source of infection? No  MEWS guidelines implemented *See Row Information* No, previously yellow, continue vital signs every 4 hours  Take Vital Signs  Increase Vital Sign Frequency  Yellow: Q 2hr X 2 then Q 4hr X 2, if remains yellow, continue Q 4hrs  Escalate  MEWS: Escalate Yellow: discuss with charge nurse/RN and consider discussing with provider and RRT  Notify: Charge Nurse/RN  Name of Charge Nurse/RN Notified Britt Bolognese  Date Charge Nurse/RN Notified 03/11/22  Time Charge Nurse/RN Notified 2021  Notify: Provider  Provider Name/Title Dalphine Handing NP  Date Provider Notified 03/11/22  Time Provider Notified 2005  Method of Notification Page  Notification Reason Other (Comment) (HR up to > 130)  Provider response Other (Comment) (Per provider to come see pt)  Date of Provider Response 03/11/22  Time of Provider Response 2018  Assess: SIRS CRITERIA  SIRS Temperature  0  SIRS Pulse 1  SIRS Respirations  0  SIRS WBC 1  SIRS Score Sum  2

## 2022-03-13 DIAGNOSIS — G4733 Obstructive sleep apnea (adult) (pediatric): Secondary | ICD-10-CM | POA: Diagnosis not present

## 2022-03-13 DIAGNOSIS — I5023 Acute on chronic systolic (congestive) heart failure: Secondary | ICD-10-CM | POA: Diagnosis not present

## 2022-03-13 DIAGNOSIS — J9621 Acute and chronic respiratory failure with hypoxia: Secondary | ICD-10-CM | POA: Diagnosis not present

## 2022-03-13 LAB — GLUCOSE, CAPILLARY
Glucose-Capillary: 84 mg/dL (ref 70–99)
Glucose-Capillary: 110 mg/dL — ABNORMAL HIGH (ref 70–99)
Glucose-Capillary: 164 mg/dL — ABNORMAL HIGH (ref 70–99)
Glucose-Capillary: 130 mg/dL — ABNORMAL HIGH (ref 70–99)

## 2022-03-13 MED ORDER — INSULIN ASPART 100 UNIT/ML IJ SOLN: 0.0000 [IU] | Freq: Every day | INTRAMUSCULAR | Status: DC

## 2022-03-13 MED ORDER — INSULIN ASPART 100 UNIT/ML IJ SOLN
0.0000 [IU] | Freq: Three times a day (TID) | INTRAMUSCULAR | Status: DC
  Administered 2022-03-14: 1 [IU] via SUBCUTANEOUS
  Filled 2022-03-13: qty 1

## 2022-03-13 NOTE — Progress Notes (Signed)
Mobility Specialist - Progress Note   03/13/22 1000  Mobility  Activity Ambulated with assistance in room;Transferred from bed to chair  Level of Assistance Minimal assist, patient does 75% or more  Assistive Device Front wheel walker  Distance Ambulated (ft) 3 ft  Activity Response Tolerated well  $Mobility charge 1 Mobility     Pt lying in bed upon arrival, utilizing 3L. Pt voiced mild back pain but reports feeling better today. Able to complete bed mobility with modA +2. STS with light +2 assist from slightly elevated bed height. Maintained proper hand placement onto BRW this date with less trunk flexion. Min cueing for sequencing task. Room setup to allow for R pivot transfer with minA +1. HR upper 110s. Pt left in recliner with alarm set, needs in reach.    Kathee Delton Mobility Specialist 03/13/22, 11:04 AM

## 2022-03-13 NOTE — Progress Notes (Signed)
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   HPI  66 y.o female with significant PMH of HFpEF (LVEF 45-50% 03/2021, OSA on CPAP, HLD, T2DM, Hypothyroidism, Afib, RLS, Thyroid cancer and chronic Lymphedema who presented to the ED with chief complaints of worsening shortness of breath.  Per patient's husband who is at the bedside, patient has required increased oxygenation for the past few days due to persistent hypoxia in the 60's and activity intolerance.  ED Course: In the emergency department, vital signs showeD blood pressure of 109/55 mm Hg, HR of 55 beats per minute, a respiratory rate of 12 breaths per minute, and a temperature of 98.41F (36.7C) sats  100% on NRB   89/1/23 - patient is stable for dc home with outpatient follow up.  I have encouraged her to follow up with bariatric surgery and she has ageed to do this.   Past Medical History   HFpEF (LVEF 45-50% 03/2021, OSA on CPAP, HLD, T2DM, Hypothyroidism, RLS, Thyroid cancer and chronic Lymphedema  Significant Hospital Events   8/25: Admitted to ICU with Acute on Chronic hypoxic hypercapnic respiratory failure 8/26: Weaned off pressors overnight. Infectious markers improving, renal function improving, start diuresis  Consults:  None  Procedures:  None  Significant Diagnostic Tests:  8/24: Chest Xray>Cardiomegaly with central vascular prominence and mild interstitial Edema 8/25: Venous US BLE>>: No  lower extremity DVT. 8/25:  Echocardiogram>> 1. Left ventricular ejection fraction, by estimation, is 45 to 50%. The  left ventricle has mildly decreased function. The left ventricle has no  regional wall motion abnormalities. Left ventricular diastolic parameters  were normal.   2. Right ventricular systolic function is normal. The right ventricular  size is normal.   3. The mitral valve is normal in structure. Mild to moderate mitral valve  regurgitation. No evidence of mitral stenosis.   4. Tricuspid valve regurgitation is mild to moderate.   5. The aortic valve is normal in structure. Aortic valve regurgitation is moderate. No aortic stenosis is present.   6. The inferior vena cava is normal in size with greater than 50%  respiratory variability, suggesting right atrial pressure of 3 mmHg.   Micro Data:  8/24: Blood culture x2> NGTD 8/24: Urine Culture> 8/24: MRSA PCR>> negative 8/24: Strep pneumo urinary antigen>negative 8/24: Legionella urinary antigen>  Antimicrobials:  Vancomycin 8/24> Cefepime 8/24 >  OBJECTIVE  Blood pressure (!) 117/58, pulse (!) 107, temperature 98.6 F (37 C), temperature source Oral, resp. rate 20, height '5\' 4"'$  (1.626 m), weight (!) 149.3 kg, SpO2 98 %.    FiO2 (%):  [35 %] 35 %   Intake/Output Summary (Last 24 hours) at 03/13/2022 0716 Last data filed at 03/13/2022 0600 Gross per 24 hour  Intake 340 ml  Output 900 ml  Net -560 ml    Filed Weights   03/10/22 0414 03/12/22 0343 03/13/22 0500  Weight: (!) 152.4 kg (!) 153.1 kg (!) 149.3 kg   Physical Examination   GENERAL: 66  year-old acute on chronically  ill appearing patient, lying in the bed on [place].  EYES: Pupils equal, round, reactive to light and accommodation. No scleral icterus. Extraocular muscles intact.  HEENT: Head atraumatic, normocephalic. Oropharynx and nasopharynx clear.  NECK:  Supple, no jugular venous distention. No thyroid enlargement, no tenderness.  LUNGS: Normal breath sounds bilaterally, no  wheezing, rales,rhonchi or crepitation. No use of accessory muscles of respiration.  CARDIOVASCULAR: S1, S2 normal. No murmurs, rubs, or gallops.  ABDOMEN: Soft, nontender,  OBESE, distended. Bowel sounds present. No organomegaly or mass.  EXTREMITIES:SEE BELOW NEUROLOGIC: Cranial nerves II through XII are intact.  Muscle strength 5/5 in all extremities. Sensation intact. Gait not checked.  PSYCHIATRIC: The patient is alert and oriented x 3.  SKIN:SEE BELOW     Labs/imaging that I havepersonally reviewed  (right click and "Reselect all SmartList Selections" daily)     Labs   CBC: Recent Labs  Lab 03/07/22 0545 03/08/22 0454 03/09/22 0430 03/10/22 0529 03/11/22 0413  WBC 9.7 10.5 10.7* 12.9* 11.2*  HGB 9.4* 9.3* 8.9* 8.9* 8.5*  HCT 34.5* 34.0* 32.4* 32.6* 31.8*  MCV 85.2 85.4 86.4 86.0 86.9  PLT 273 265 240 267 277     Basic Metabolic Panel: Recent Labs  Lab 03/07/22 0545 03/08/22 0454 03/09/22 0430 03/10/22 0529 03/11/22 0413 03/12/22 0631  NA 142 143 143 144 142 141  K 3.6 3.4* 3.3* 3.7 3.7 3.8  CL 101 100 98 99 96* 95*  CO2 33* 36* 37* 37* 36* 37*  GLUCOSE 83 108* 113* 125* 84 89  BUN 52* 42* 31* 25* 24* 24*  CREATININE 1.48* 1.28* 1.03* 1.01* 1.05* 0.89  CALCIUM 8.3* 8.5* 8.4* 8.6* 8.5* 8.2*  MG 2.2 2.2 2.2 2.1  --   --   PHOS 4.6 3.4  --  2.9  --   --     GFR: Estimated Creatinine Clearance: 89.6 mL/min (by C-G formula based on SCr of 0.89 mg/dL). Recent Labs  Lab 03/07/22 0545 03/08/22 0454 03/09/22 0430 03/10/22 0529 03/10/22 1442 03/11/22 0413  PROCALCITON 0.22 0.10  --   --  0.22 0.19  WBC 9.7 10.5 10.7* 12.9*  --  11.2*  LATICACIDVEN  --   --   --   --  1.2  --      Liver Function Tests: No results for input(s): "AST", "ALT", "ALKPHOS", "BILITOT", "PROT", "ALBUMIN" in the last 168 hours.  No results for input(s): "LIPASE", "AMYLASE" in the last 168 hours. Recent Labs  Lab 03/08/22 1000  AMMONIA 25     ABG    Component Value  Date/Time   PHART 7.43 03/11/2022 0459   PCO2ART 67 ([place]) 03/11/2022 0459   PO2ART 84 03/11/2022 0459   HCO3 44.5 (H) 03/11/2022 0459   O2SAT 97.6 03/11/2022 0459     Coagulation Profile: No results for input(s): "INR", "PROTIME" in the last 168 hours.  Cardiac Enzymes: No results for input(s): "CKTOTAL", "CKMB", "CKMBINDEX", "TROPONINI" in the last 168 hours.  HbA1C: Hgb A1c MFr Bld  Date/Time Value Ref Range Status  03/06/2022 04:04 AM 6.4 (H) 4.8 - 5.6 % Final    Comment:    (NOTE) Pre diabetes:          5.7%-6.4%  Diabetes:              >6.4%  Glycemic control for   <7.0% adults with diabetes   03/15/2021 05:27 AM 6.3 (H) 4.8 - 5.6 % Final    Comment:    (NOTE) Pre diabetes:  5.7%-6.4%  Diabetes:              >6.4%  Glycemic control for   <7.0% adults with diabetes     CBG: Recent Labs  Lab 03/12/22 0350 03/12/22 0749 03/12/22 1159 03/12/22 1634 03/12/22 1936  GLUCAP 102* 88 105* 130* 115*     Review of Systems:    Positives in BOLD: Gen: Denies fever, chills, weight change, fatigue, night sweats HEENT: Denies blurred vision, double vision, hearing loss, tinnitus, sinus congestion, rhinorrhea, sore throat, neck stiffness, dysphagia PULM: Denies shortness of breath, cough, sputum production, hemoptysis, wheezing CV: Denies chest pain, edema, orthopnea, paroxysmal nocturnal dyspnea, palpitations GI: Denies abdominal pain, nausea, vomiting, diarrhea, hematochezia, melena, constipation, change in bowel habits GU: Denies dysuria, hematuria, polyuria, oliguria, urethral discharge Endocrine: Denies hot or cold intolerance, polyuria, polyphagia or appetite change Derm: Denies rash, dry skin, scaling or peeling skin change Heme: Denies easy bruising, bleeding, bleeding gums Neuro: Denies headache, numbness, weakness, slurred speech, loss of memory or consciousness   Past Medical History  She,  has a past medical history of CHF (congestive heart  failure) ([place]), Diabetes mellitus without complication ([place]), Hypothyroidism, Lymphedema, RLS (restless legs syndrome), Sleep apnea, and Thyroid cancer ([place]) (2007).   Surgical History    Past Surgical History:  Procedure Laterality Date   ABDOMINAL HYSTERECTOMY     COLONOSCOPY WITH PROPOFOL N/A 08/26/2015   Procedure: COLONOSCOPY WITH PROPOFOL;  Surgeon: Manya Silvas, MD;  Location: [facility] ENDOSCOPY;  Service: Endoscopy;  Laterality: N/A;   THYROIDECTOMY       Social History   reports that she has never smoked. She has never used smokeless tobacco. She reports that she does not drink alcohol and does not use drugs.   Family History   Her family history is negative for Breast cancer.   Allergies No Known Allergies   Home Medications  Prior to Admission medications   Medication Sig Start Date End Date Taking? Authorizing Provider  apixaban (ELIQUIS) 5 MG TABS tablet Take 1 tablet (5 mg total) by mouth 2 (two) times daily. 07/25/21  Yes Dessa Phi, DO  atorvastatin (LIPITOR) 20 MG tablet Take 20 mg by mouth daily. 01/31/20  Yes [provider]  Biotin 2.5 MG CAPS Take 2.5 mg by mouth daily.    Yes [provider]  buPROPion (WELLBUTRIN XL) 150 MG 24 hr tablet Take 150 mg by mouth daily. 01/10/21  Yes [provider]  carvedilol (COREG) 3.125 MG tablet Take 1 tablet (3.125 mg total) by mouth 2 (two) times daily with a meal. 07/28/21  Yes Dessa Phi, DO  celecoxib (CELEBREX) 200 MG capsule Take 200 mg by mouth 2 (two) times daily. 02/10/22  Yes [provider]  Cholecalciferol 25 MCG (1000 UT) tablet Take 1 tablet by mouth daily.   Yes [provider]  gabapentin (NEURONTIN) 300 MG capsule Take 300 mg by mouth 3 (three) times daily. 02/09/22  Yes [provider]  levothyroxine (SYNTHROID) 125 MCG tablet Take 1 tablet (125 mcg total) by mouth daily. 07/26/21  Yes Dessa Phi, DO  Multiple Vitamin (MULTIVITAMIN) tablet Take 1 tablet  by mouth daily.   Yes [provider]  pantoprazole (PROTONIX) 40 MG tablet Take 40 mg by mouth daily. 01/11/21  Yes [provider]  potassium chloride SA (KLOR-CON M) 20 MEQ tablet Take 20 mEq by mouth daily. 12/15/21  Yes [provider]  propranolol (INDERAL) 10 MG tablet Take 20 mg by mouth 4 (four)  times daily.   Yes [provider]  rOPINIRole (REQUIP) 3 MG tablet Take 3 mg by mouth 3 (three) times daily. 01/20/18  Yes [provider]  Semaglutide, 1 MG/DOSE, 2 MG/1.5ML SOPN Inject 0.75 mLs into the skin once a week. 10/14/20  Yes [provider]  torsemide (DEMADEX) 20 MG tablet Take 2 tablets (40 mg total) by mouth daily. 04/14/21  Yes Darylene Price A, FNP  zinc gluconate 50 MG tablet Take 50 mg by mouth daily.   Yes [provider]  Scheduled Meds:  apixaban  5 mg Oral BID   atorvastatin  20 mg Oral Daily   buPROPion  150 mg Oral Daily   furosemide  40 mg Intravenous BID   gabapentin  200 mg Oral BID   gabapentin  300 mg Oral QHS   insulin aspart  0-9 Units Subcutaneous Q4H   ipratropium-albuterol  3 mL Nebulization BID   levothyroxine  125 mcg Oral Q0600   pantoprazole  40 mg Oral Daily   rOPINIRole  2 mg Oral BID   rOPINIRole  3 mg Oral QHS   spironolactone  25 mg Oral Daily   Continuous Infusions:   PRN Meds:.acetaminophen, ALPRAZolam, docusate sodium, ipratropium-albuterol, mouth rinse, oxyCODONE-acetaminophen, polyethylene glycol   Active Hospital Problem list     Assessment & Plan:   Acute Hypoxic Hypercapnic Respiratory Failure secondary to CHF exacerbation  OSA on CPAP COPD without evidence of acute exacerbation -Supplemental O2 as needed to maintain O2 sats 88 to 92% -BiPAP qhs and as needed -Follow intermittent Chest X-ray & ABG as needed -Bronchodilators & Pulmicort nebs -Diuresis as BP and renal function permits ~ Start Lasix IV 20 mg BID -Aggressive Pulmonary toilet as able -Mobilize as able,  consult PT/OT   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%) BNP 955 on admission Shock: Septic +/- Cardiogenic ~ RESOLVED Mildly elevated Troponin, suspect demand ischemia PMHx: HTN, HLD, Atrial fibrillation Echocardiogram 03/06/22: LVEF 95-28%, normal diastolic parameters, RV systolic function normal, mild to moderate MR, mild to moderate TR -Continuous cardiac monitoring -Maintain MAP >65 -Vasopressors as needed to maintain MAP goal ~ weaned off since evening of 8/25 -Lactic acid has normalized -Trend HS Troponin until peaked (16 ~ 29 ~ ) -Cortisol normal 14.8 -Diuresis as BP and renal function permits ~ Start 20 mg IV Lasix BID now that AKI improving -Consider resuming home carvedilol 6.'25mg'$  BID, torsemine '40mg'$   -Continue Eliquis and Atorvastatin   Severe Sepsis in the setting of suspected Cellulitis of bilateral lower extremities & questionable UTI PMHx: chronic bilateral venous stasis Meets SIRS Criteria  -Monitor fever curve -Trend WBC's & Procalcitonin -Follow cultures as above -Continue empiric Cefepime & Vancomycin pending cultures & sensitivities -Wound care   AKI on CKD Stage III ~ IMPROVING -Monitor I&O's / urinary output -Follow BMP -Ensure adequate renal perfusion -Avoid nephrotoxic agents as able -Replace electrolytes as indicated -Pharmacy following for assistance with electrolyte replacement   Diabetes Mellitus Type II Hgb A1c is 6.4 -CBG's AC & qhs; Target range of 140 to 180 -SSI -Follow ICU Hypo/Hyperglycemia protocol   Hypothyroidism -Continue home Synthroid -TSH and thyroid panel pending     Best practice:  Diet:  Regular Pain/Anxiety/Delirium protocol (if indicated): No VAP protocol (if indicated): Not indicated DVT prophylaxis: DOAC GI prophylaxis: PPI Glucose control:  SSI Yes Central venous access:  N/A Arterial line:  N/A Foley:  N/A Mobility:  OOB as tolerated  PT consulted: yes Last date of multidisciplinary goals of care discussion  [  8/25] Code Status:  full code Disposition: ICU     Ottie Glazier, M.D.  Pulmonary & [place]

## 2022-03-13 NOTE — Plan of Care (Signed)

## 2022-03-13 NOTE — Progress Notes (Signed)
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   Patient was admitted to ICU with Acute Hypoxic Hypercapnic Respiratory Failure secondary to CHF decompensation requiring BiPAP, along with severe Sepsis with septic shock in setting of UTI vs ?cellulitis of bilateral lower extremities vs stasis dermatitis due to peripheral edema.  She has open superficial wound on each lower leg from fluid weeping which formed blisters.  Stabilized and patient transferred to hospitalist service on 8/27.    Since then, patient has been receiving diuresis over 6.5 L.  Over the past 2 days, patient's oxygen requirement slightly increased.  And on 8/29, patient noted to have low-grade fever.  Had episode of brief tachycardia and ABG done noted worsening hypercarbia and hypoxia.  Patient placed on BiPAP.  By 8/30, doing well and able to maintain on 3 L nasal cannula.    Assessment and Plan: Assessment and Plan: * Acute on chronic systolic CHF (congestive heart failure) ([place]) Presented with progressive dyspnea, hypoxia, lower extremity edema, BNP 955 and CXR with cardiomegaly and central vascular congestion with mild interstitial edema.  Initially diuretics held due to hypotension and shock. Lasix started once patient's blood pressure stabilized.  Lasix increased to 40 twice daily on 8/29.  And he has since diuresed almost 12 L and is more than -8.5 L deficient.   Home torsemide and metolazone on hold.  Given morbid obesity, BNP is likely significantly likely underestimating degree of heart failure.  Acute on chronic respiratory failure with hypoxia and hypercapnia (HCC) At baseline, patient uses trilogy  NIV at home.  Presented with worsening shortness of breath and hypoxia requiring BiPAP.  Supplemental O2 to maintain sats 88 to 93%.  In review of patient's vitals, oxygen levels in the last day have required 4 to 5 L, but oxygen saturations have been higher than needed, greater than 95% which may be precipitating cause for acute respiratory failure and hypercarbia currently.  Patient changed over to BiPAP.  Able to be weaned off of BiPAP during the day by 8/30.  PCO2 notes improvement and patient breathing more comfortably.  Patient on 3 L which she needs with ambulation.  This may be her new baseline.  Cleared by pulmonary.  Shock (HCC)-resolved as of 03/10/2022 Septic versus cardiogenic shock -resolved. Briefly required pressors on admission to ICU. Now off pressors. -- Monitor BP closely -- Treat CHF and infection as outlined -- Maintain MAP over 65 -- Home Coreg, torsemide and ARB are on hold, resume when BP will tolerate  Severe sepsis (HCC)-resolved as of 03/10/2022 .Patient met SIRS criteria on admission with leukocytosis and tachypnea in setting of suspected UTI versus cellulitis.  Unclear if shock is cardiogenic or septic in nature, cannot rule out septic shock but clinically undetermined.  Sepsis itself stabilized. -- Sepsis physiology improved which was de-escalated to Rocephin. Procalcitonin and white blood cell count continue to trend downward  Lymphedema of both lower extremities With increased swelling to the point of weeping and blister formation from fluid, erythema bilaterally consistent with stasis dermatitis and lymphedema.  Will continue antibiotic coverage given open skin wounds. -- De-escalated antibiotics to Rocephin.  Seen by wound care and  checking ABIs. -- Continue diuresis  Acute metabolic encephalopathy-resolved as of 03/10/2022 POA, likely due to hypoxia and infection.  Hypercarbia may contribute but pH is compensated.  Ammonia level normal.  No focal neurologic  deficits.   ICU environment contributes.  Avoiding sedating medications.  Has been feels close to baseline and should improve even further with treatment of worsening hypercarbia.  PAF (paroxysmal atrial fibrillation) (HCC) Continue Eliquis.  Coreg on hold until BP improves.  Monitor on telemetry.  Had brief episode on 8/29 due to worsening respiratory failure.  Acute kidney injury in the setting of stage 3a chronic kidney disease ([place]) AKI superimposed on CKD IIIa Presented with creatinine 1.97 in the setting of decompensated CHF and volume overload, probable infection, likely prerenal AKI.  Renal function close to baseline.  Monitor while aggressively diuresing.  OSA (obstructive sleep apnea) Uses trilogy/NIV at home.   BiPAP at night  Elevated troponin Mild and flat trend.  Likely demand ischemia in the setting of decompensated CHF and shock.  Patient is chest pain-free and EKG non-acute.  Type 2 diabetes mellitus with diabetic neuropathy, without long-term current use of insulin (HCC) Continue sliding scale NovoLog  Benign essential hypertension Presented in shock requiring vasopressors.  BP has stabilized but home antihypertensives are still on hold.  Resume when BP will tolerate.  Hypothyroidism Continue Synthroid  HLD (hyperlipidemia) Continue statin  Hypokalemia-resolved as of 03/10/2022 Replacing oral KCl for K3.3 this morning.  Monitor BMP, replace K as needed.  Morbid obesity with BMI of 50.0-59.9, adult (HCC) Body mass index is 58.31 kg/m. Complicates overall care and prognosis.       Body mass index is 56.5 kg/m.        Consultants: Critical care  Procedures: Echocardiogram done 8/25: Preserved diastolic function.  Ejection fraction 45% ABIs pending  Antimicrobials: IV Rocephin 8/27-present IV cefepime and vancomycin 8/25 - 8/26   Code Status: Full code   Subjective: Patient states breathing continues to remain stable.  Objective: Noted  hypoxia Vitals:   03/13/22 1152 03/13/22 1531  BP: (!) 108/57 121/63  Pulse: (!) 58 (!) 104  Resp: 18 18  Temp: 98.7 F (37.1 C) 99 F (37.2 C)  SpO2: 95% 93%    Intake/Output Summary (Last 24 hours) at 03/13/2022 1552 Last data filed at 03/13/2022 1500 Gross per 24 hour  Intake 100 ml  Output 500 ml  Net -400 ml    Filed Weights   03/10/22 0414 03/12/22 0343 03/13/22 0500  Weight: (!) 152.4 kg (!) 153.1 kg (!) 149.3 kg   Body mass index is 56.5 kg/m.  Exam:  General: Alert and oriented x3, no acute distress HEENT: Normocephalic, atraumatic, narrow airway Cardiovascular: Irregular rhythm, rate controlled Respiratory: Decreased breath sounds throughout secondary to body habitus Abdomen: Soft, obese, nontender, positive bowel sounds Musculoskeletal: No clubbing or cyanosis, 1-2+ pitting edema Skin: No breaks, tears or lesion, bilateral erythema Psychiatry: Appropriate, no evidence of psychoses and Neurology: No focal deficits  Data Reviewed: No labs today  Disposition:  Status is: Inpatient Remains inpatient appropriate because:  Approval for skilled nursing Finishing diuresis  Anticipated discharge date: 9/5, going to skilled nursing  Family Communication: Left message for husband DVT Prophylaxis:  apixaban (ELIQUIS) tablet 5 mg   I have spent 35 minutes in the care of this critically ill patient including review of patient's labs and films, medical decision making and bedside examination.  Author: Annita Brod ,MD 03/13/2022 3:52 PM  To reach On-call, see care teams  to locate the attending and reach out via [email or web address]. Between 7PM-7AM, please contact night-coverage If you still have difficulty reaching the attending provider, please page the [facility] (Director on Call) for [facility] on amion for assistance.

## 2022-03-13 NOTE — Progress Notes (Signed)
Physical Therapy Treatment Patient Details Name: Madison Mcbride MRN: [id number] DOB: March 08, 1956 Today's Date: 03/13/2022   History of Present Illness Pt is a 66 yo female that presented to the ED for SOB, weakness. Admitted to ICU for acaute on chronic hypoxic hypercapnic respiratory failure, sepsis due to wound infection.  PMH of COPD, CHF, DM, lymphedema, thyroid cancer, sleep apnea, afib, RLS.    PT Comments    Pt seen for PT tx with +2 with mobility specialist. Pt's husband Madison Mcbride) present for session. O2 assessment per MD request, see below. Pt received in recliner & attempts STS, requiring 4 attempts & MAX assist +2 for successful STS despite unsafe hand placement as pt continues to pull to standing with BUE on RW. Pt completes step pivot to bed on R with +2 assist with L knee buckling noted & pt requires max assist to return supine. PT discussed recommendation of STR instead of HHPT with pt & husband; also educated them on recommendation of DME of manual hoyer lift & sling & w/c, along with non emergent EMS transport home if pt elects to return home. Of note, pt reports she has a lift chair at home, but unsure this would be sufficient to assist her with transfers & pt's husband is recovering from a broken UE & is limited in his ability to assist wife. Strongly recommend pt d/c to STR upon d/c to maximize independence with mobility, decrease caregiver burden, & decrease fall risk prior to return home.   Pt received on 3L/min via nasal cannula, SpO2 >90% Weaned to room air, lowest SpO2 85% but pt declines SOB, unable to recover with pursed lip breathing Pt requires 3L/min to maintain SpO2 >/= 90% at rest, does still drop to 85% with transfer recliner>bed. Max HR of 162 bpm after transfer.    Recommendations for follow up therapy are one component of a multi-disciplinary discharge planning process, led by the attending physician.  Recommendations may be updated based on patient status,  additional functional criteria and insurance authorization.  Follow Up Recommendations  Skilled nursing-short term rehab (<3 hours/day) Can patient physically be transported by private vehicle: No   Assistance Recommended at Discharge Frequent or constant Supervision/Assistance  Patient can return home with the following Two people to help with walking and/or transfers;Two people to help with bathing/dressing/bathroom;Help with stairs or ramp for entrance;Assist for transportation;Assistance with cooking/housework;Direct supervision/assist for financial management   Equipment Recommendations  Wheelchair (measurements PT);Wheelchair cushion (measurements PT) (manual hoyer lift & sling, non emergent EMS transport home)    Recommendations for Other Services       Precautions / Restrictions Precautions Precautions: Fall Restrictions Weight Bearing Restrictions: No     Mobility  Bed Mobility Overal bed mobility: Needs Assistance Bed Mobility: Sit to Supine       Sit to supine: Max assist (assistance to elevate BLE onto bed, bed flat)   General bed mobility comments: +2 to scoot to [facility] with bed in trendelenburg position    Transfers Overall transfer level: Needs assistance Equipment used: Rolling walker (2 wheels) Transfers: Sit to/from Stand, Bed to chair/wheelchair/BSC Sit to Stand: Max assist, +2 physical assistance   Step pivot transfers: Max assist, +2 physical assistance       General transfer comment: Pt requires 4 attempts for successful STS from recliner with pt initially attempting proper hand placement (1UE to push to standing) but eventually pulls with BUE on RW (and RW comes off ground at one point) with extra effort to  fully upright trunk. Pt completes step pivot to bed on R with RW & +2 assist with L knee buckling noted & pt requires cuing for technique/RW positioning.    Ambulation/Gait                   Stairs             Wheelchair  Mobility    Modified Rankin (Stroke Patients Only)       Balance Overall balance assessment: Needs assistance Sitting-balance support: Feet supported Sitting balance-Leahy Scale: Good     Standing balance support: Bilateral upper extremity supported, During functional activity, Reliant on assistive device for balance Standing balance-Leahy Scale: Poor                              Cognition Arousal/Alertness: Awake/alert Behavior During Therapy: WFL for tasks assessed/performed Overall Cognitive Status: Within Functional Limits for tasks assessed                                          Exercises      General Comments        Pertinent Vitals/Pain Pain Assessment Pain Assessment: Faces Faces Pain Scale: Hurts a little bit Pain Location: L knee Pain Descriptors / Indicators: Discomfort Pain Intervention(s): Monitored during session    Home Living                          Prior Function            PT Goals (current goals can now be found in the care plan section) Acute Rehab PT Goals Patient Stated Goal: to get her strength back PT Goal Formulation: With patient Time For Goal Achievement: 03/21/22 Potential to Achieve Goals: Fair Progress towards PT goals: Progressing toward goals    Frequency    Min 2X/week      PT Plan Current plan remains appropriate;Equipment recommendations need to be updated    Co-evaluation              AM-PAC PT "6 Clicks" Mobility   Outcome Measure  Help needed turning from your back to your side while in a flat bed without using bedrails?: Total Help needed moving from lying on your back to sitting on the side of a flat bed without using bedrails?: Total Help needed moving to and from a bed to a chair (including a wheelchair)?: Total Help needed standing up from a chair using your arms (e.g., wheelchair or bedside chair)?: Total Help needed to walk in hospital room?:  Total Help needed climbing 3-5 steps with a railing? : Total 6 Click Score: 6    End of Session Equipment Utilized During Treatment: Oxygen;Gait belt Activity Tolerance: Patient limited by fatigue Patient left: in bed;with call bell/phone within reach;with bed alarm set;with family/visitor present Nurse Communication: Mobility status PT Visit Diagnosis: Difficulty in walking, not elsewhere classified (R26.2);Muscle weakness (generalized) (M62.81);Unsteadiness on feet (R26.81)     Time: 1423-1450 PT Time Calculation (min) (ACUTE ONLY): 27 min  Charges:  $Therapeutic Activity: 23-37 mins                     Lavone Nian, PT, DPT 03/13/22, 3:04 PM  Waunita Schooner 03/13/2022, 3:00 PM

## 2022-03-13 NOTE — TOC Progression Note (Signed)
Transition of Care [facility]) - Progression Note    Patient Details  Name: Madison Mcbride MRN: [id number] Date of Birth: November 03, 1955  Transition of [facility]) CM/SW Contact  Laurena Slimmer, RN Phone Number: 03/13/2022, 4:25 PM  Clinical Narrative:    Spokew with patient and spouse. Patient agreeable to SNF at [facility]. FL2 completed. Bed search initiated.    Expected Discharge Plan: [place] Barriers to Discharge: Continued Medical Work up  Expected Discharge Plan and Services Expected Discharge Plan: [place]   Discharge Planning Services: CM Consult Post Acute Care Choice: [place] arrangements for the past 2 months: Single Family Home                 DME Arranged: N/A DME Agency: NA       HH Arranged: RN, PT, OT HH Agency: [place] Date [place]: 03/07/22 Time [place]: 1624     Social Determinants of Health (SDOH) Interventions    Readmission Risk Interventions    03/07/2022    4:12 PM  Readmission Risk Prevention Plan  Transportation Screening Complete  PCP or Specialist Appt within 5-7 Days Complete  Home Care Screening Complete  Medication Review (RN CM) Complete

## 2022-03-13 NOTE — TOC Progression Note (Signed)
Transition of Care [facility]) - Progression Note    Patient Details  Name: Madison Mcbride MRN: [id number] Date of Birth: 1956/01/18  Transition [facility] Care [facility]) CM/SW Contact  Laurena Slimmer, RN Phone Number: 03/13/2022, 1:37 PM  Clinical Narrative:    Spoke with patient at bedside. Patient husband was present. Advised to bring home oxygen to transport home at discharge.    Expected Discharge Plan: [place] Barriers to Discharge: Continued Medical Work up  Expected Discharge Plan and Services Expected Discharge Plan: [place]   Discharge Planning Services: CM Consult Post Acute Care Choice: [place] arrangements for the past 2 months: Single Family Home                 DME Arranged: N/A DME Agency: NA       HH Arranged: RN, PT, OT HH Agency: [place] Date [place]: 03/07/22 Time [place]: 1624     Social Determinants of Health (SDOH) Interventions    Readmission Risk Interventions    03/07/2022    4:12 PM  Readmission Risk Prevention Plan  Transportation Screening Complete  PCP or Specialist Appt within 5-7 Days Complete  Home Care Screening Complete  Medication Review (RN CM) Complete

## 2022-03-13 NOTE — Consult Note (Signed)
WOC follow up, ABIs normal.  Would recommend follow up  as outpatient for compression therapy needs. [facility] nursing services will resume on the [facility] campus Monday am if patient still inpatient could initiate Unna's boots at that point.   Notified MD of same.    Re consult if needed, will not follow at this time. Thanks  Donni Oglesby [employer], RN,CWOCN, CNS, [place] [phone])

## 2022-03-13 NOTE — NC FL2 (Signed)
[place] LEVEL OF CARE SCREENING TOOL     IDENTIFICATION  Patient Name: Madison Mcbride Birthdate: 02/01/1956 Sex: female Admission Date (Current Location): 03/05/2022  [facility] and [place] Number:  Engineering geologist and Address:  [facility], [address], [place], [place] [address]      Provider Number: [id number]  Attending Physician Name and Address:  Annita Brod, MD  Relative Name and Phone Number:  Benajamn Boettcher,[phone]    Current Level of Care: Hospital Recommended Level of Care: [place] Prior Approval Number:    Date Approved/Denied:   PASRR Number: [id number]  Discharge Plan: SNF    Current Diagnoses: Patient Active Problem List   Diagnosis Date Noted   PAF (paroxysmal atrial fibrillation) ([place]) 03/08/2022   Acute on chronic respiratory failure with hypoxia and hypercapnia ([place]) 03/05/2022   Effusion of knee joint, left 03/22/2021   Acute on chronic systolic CHF (congestive heart failure) ([place]) 03/19/2021   Acute on chronic heart failure ([place]) 03/14/2021   Acute hypoxemic respiratory failure ([place]) 03/14/2021   Diabetes mellitus without complication ([place]) 32/20/2542   HLD (hyperlipidemia) 04/09/2020   Depression 04/09/2020   Elevated troponin 04/09/2020   OSA (obstructive sleep apnea) 04/09/2020   Peripheral polyneuropathy 02/29/2020   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   Lumbar radiculopathy, acute    Acute midline low back pain without sciatica    Type 2 diabetes mellitus with diabetic neuropathy, without long-term current use of insulin ([place]) 02/21/2020   Hypothyroidism 02/21/2020   Morbid obesity with BMI of 50.0-59.9, adult ([place]) 02/21/2020   CAP (community acquired pneumonia) 02/21/2020   Bilateral cellulitis of lower leg 02/21/2020   Acute respiratory failure with hypoxia and hypercapnia (HCC) 02/21/2020   Acute renal failure  superimposed on stage 3a chronic kidney disease ([place]) 02/21/2020   Acute kidney injury in the setting of stage 3a chronic kidney disease ([place]) 01/27/2020   Transaminitis 01/27/2020   Lymphedema of both lower extremities 08/21/2016   Benign essential hypertension 05/18/2016    Orientation RESPIRATION BLADDER Height & Weight     Self, Time, Situation, Place  O2 (023L) External catheter Weight: (!) 149.3 kg Height:  '5\' 4"'$  (162.6 cm)  BEHAVIORAL SYMPTOMS/MOOD NEUROLOGICAL BOWEL NUTRITION STATUS  Other (Comment) (n/a)   Continent Diet (Heart Health)  AMBULATORY STATUS COMMUNICATION OF NEEDS Skin   Limited Assist Verbally Other (Comment) (eccyhmosis)                       Personal Care Assistance Level of Assistance  Bathing, Dressing Bathing Assistance: Limited assistance         Functional Limitations Info  Sight Sight Info: Impaired        SPECIAL CARE FACTORS FREQUENCY  PT (By licensed PT), OT (By licensed OT)                    Contractures Contractures Info: Not present    Additional Factors Info  Code Status, Allergies Code Status Info: FULL Allergies Info: No Known Allergies           Current Medications (03/13/2022):  This is the current hospital active medication list Current Facility-Administered Medications  Medication Dose Route Frequency Provider Last Rate Last Admin   acetaminophen (TYLENOL) tablet 650 mg  650 mg Oral Q6H PRN Annita Brod, MD   650 mg at 03/10/22 0817   ALPRAZolam (XANAX) tablet 0.25 mg  0.25 mg Oral  TID PRN Annita Brod, MD   0.25 mg at 03/08/22 2147   apixaban (ELIQUIS) tablet 5 mg  5 mg Oral BID Annita Brod, MD   5 mg at 03/13/22 0803   atorvastatin (LIPITOR) tablet 20 mg  20 mg Oral Daily Annita Brod, MD   20 mg at 03/13/22 0803   buPROPion (WELLBUTRIN XL) 24 hr tablet 150 mg  150 mg Oral Daily Annita Brod, MD   150 mg at 03/13/22 0803   docusate sodium (COLACE) capsule 100 mg  100 mg Oral  BID PRN Annita Brod, MD   100 mg at 03/12/22 5277   furosemide (LASIX) injection 40 mg  40 mg Intravenous BID Annita Brod, MD   40 mg at 03/13/22 0803   gabapentin (NEURONTIN) capsule 200 mg  200 mg Oral BID Annita Brod, MD   200 mg at 03/13/22 1544   gabapentin (NEURONTIN) capsule 300 mg  300 mg Oral QHS Gevena Barre K, MD   300 mg at 03/12/22 2141   insulin aspart (novoLOG) injection 0-9 Units  0-9 Units Subcutaneous Q4H Annita Brod, MD   1 Units at 03/12/22 1733   ipratropium-albuterol (DUONEB) 0.5-2.5 (3) MG/3ML nebulizer solution 3 mL  3 mL Nebulization Q6H PRN Annita Brod, MD       levothyroxine (SYNTHROID) tablet 125 mcg  125 mcg Oral Q0600 Annita Brod, MD   125 mcg at 03/13/22 8242   Oral care mouth rinse  15 mL Mouth Rinse PRN Annita Brod, MD       oxyCODONE-acetaminophen (PERCOCET) 7.5-325 MG per tablet 1 tablet  1 tablet Oral Q4H PRN Annita Brod, MD   1 tablet at 03/13/22 0814   pantoprazole (PROTONIX) EC tablet 40 mg  40 mg Oral Daily Annita Brod, MD   40 mg at 03/13/22 0803   polyethylene glycol (MIRALAX / GLYCOLAX) packet 17 g  17 g Oral Daily PRN Annita Brod, MD       rOPINIRole (REQUIP) tablet 2 mg  2 mg Oral BID Annita Brod, MD   2 mg at 03/13/22 1544   rOPINIRole (REQUIP) tablet 3 mg  3 mg Oral QHS Annita Brod, MD   3 mg at 03/12/22 2141   spironolactone (ALDACTONE) tablet 25 mg  25 mg Oral Daily Annita Brod, MD   25 mg at 03/13/22 0803     Discharge Medications: Please see discharge summary for a list of discharge medications.  Relevant Imaging Results:  Relevant Lab Results:   Additional Information Ss# [id number]  Laurena Slimmer, RN

## 2022-03-14 DIAGNOSIS — I5023 Acute on chronic systolic (congestive) heart failure: Secondary | ICD-10-CM | POA: Diagnosis not present

## 2022-03-14 DIAGNOSIS — J9621 Acute and chronic respiratory failure with hypoxia: Secondary | ICD-10-CM | POA: Diagnosis not present

## 2022-03-14 DIAGNOSIS — G4733 Obstructive sleep apnea (adult) (pediatric): Secondary | ICD-10-CM | POA: Diagnosis not present

## 2022-03-14 LAB — CBC
HCT: 34.7 % — ABNORMAL LOW (ref 36.0–46.0)
Hemoglobin: 9.4 g/dL — ABNORMAL LOW (ref 12.0–15.0)
MCH: 23.1 pg — ABNORMAL LOW (ref 26.0–34.0)
MCHC: 27.1 g/dL — ABNORMAL LOW (ref 30.0–36.0)
MCV: 85.3 fL (ref 80.0–100.0)
Platelets: 401 10*3/uL — ABNORMAL HIGH (ref 150–400)
RBC: 4.07 MIL/uL (ref 3.87–5.11)
RDW: 18.9 % — ABNORMAL HIGH (ref 11.5–15.5)
WBC: 12.9 10*3/uL — ABNORMAL HIGH (ref 4.0–10.5)
nRBC: 0 % (ref 0.0–0.2)

## 2022-03-14 LAB — BASIC METABOLIC PANEL
Anion gap: 6 (ref 5–15)
BUN: 22 mg/dL (ref 8–23)
CO2: 40 mmol/L — ABNORMAL HIGH (ref 22–32)
Calcium: 8.2 mg/dL — ABNORMAL LOW (ref 8.9–10.3)
Chloride: 97 mmol/L — ABNORMAL LOW (ref 98–111)
Creatinine, Ser: 0.94 mg/dL (ref 0.44–1.00)
GFR, Estimated: >60 mL/min (ref >60)
Glucose, Bld: 93 mg/dL (ref 70–99)
Potassium: 3.5 mmol/L (ref 3.5–5.1)
Sodium: 143 mmol/L (ref 135–145)

## 2022-03-14 LAB — GLUCOSE, CAPILLARY
Glucose-Capillary: 86 mg/dL (ref 70–99)
Glucose-Capillary: 115 mg/dL — ABNORMAL HIGH (ref 70–99)
Glucose-Capillary: 133 mg/dL — ABNORMAL HIGH (ref 70–99)
Glucose-Capillary: 125 mg/dL — ABNORMAL HIGH (ref 70–99)

## 2022-03-14 LAB — PROCALCITONIN: Procalcitonin: 0.18 ng/mL

## 2022-03-14 LAB — BRAIN NATRIURETIC PEPTIDE: B Natriuretic Peptide: 651.7 pg/mL — ABNORMAL HIGH (ref 0.0–100.0)

## 2022-03-14 MED ORDER — FUROSEMIDE 40 MG PO TABS
40.0000 mg | ORAL_TABLET | Freq: Two times a day (BID) | ORAL | Status: DC
  Administered 2022-03-14: 40 mg via ORAL

## 2022-03-14 MED ORDER — FUROSEMIDE 10 MG/ML IJ SOLN: 60.0000 mg | Freq: Two times a day (BID) | INTRAMUSCULAR | Status: DC

## 2022-03-14 MED ORDER — FUROSEMIDE 10 MG/ML IJ SOLN
40.0000 mg | Freq: Two times a day (BID) | INTRAMUSCULAR | Status: DC
  Administered 2022-03-14 – 2022-03-19 (×10): 40 mg via INTRAVENOUS
  Filled 2022-03-14 (×10): qty 4

## 2022-03-14 NOTE — Progress Notes (Signed)
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   Patient was admitted to ICU with Acute Hypoxic Hypercapnic Respiratory Failure secondary to CHF decompensation requiring BiPAP, along with severe Sepsis with septic shock in setting of UTI vs ?cellulitis of bilateral lower extremities vs stasis dermatitis due to peripheral edema.  She has open superficial wound on each lower leg from fluid weeping which formed blisters.  Stabilized and patient transferred to hospitalist service on 8/27.    Since then, patient has been receiving diuresis over 6.5 L.  Over the past 2 days, patient's oxygen requirement slightly increased.  And on 8/29, patient noted to have low-grade fever.  Had episode of brief tachycardia and ABG done noted worsening hypercarbia and hypoxia.  Patient placed on BiPAP.  By 8/30, doing well and able to maintain on 3 L nasal cannula.    Assessment and Plan: Assessment and Plan: * Acute on chronic systolic CHF (congestive heart failure) ([place]) Presented with progressive dyspnea, hypoxia, lower extremity edema, BNP 955 and CXR with cardiomegaly and central vascular congestion with mild interstitial edema.  Initially diuretics held due to hypotension and shock. Lasix started once patient's blood pressure stabilized.  Lasix increased to 40 twice daily on 8/29.  Also with Aldactone.  She has since diuresed almost 12 L and is more than -8.5 L deficient.   Home torsemide on hold.  Given morbid obesity, BNP is likely significantly likely underestimating degree of heart failure.  BNP still elevated at 630 and patient with 147 kg, was 140 kg at an office visit at the beginning of  the month.  Continue IV Lasix.  Acute on chronic respiratory failure with hypoxia and hypercapnia (HCC) At baseline, patient uses trilogy NIV at home.  Presented with worsening shortness of breath and hypoxia requiring BiPAP.  Supplemental O2 to maintain sats 88 to 93%.  In review of patient's vitals, oxygen levels in the last day have required 4 to 5 L, but oxygen saturations have been higher than needed, greater than 95% which may be precipitating cause for acute respiratory failure and hypercarbia currently.  Patient changed over to BiPAP.  Able to be weaned off of BiPAP during the day by 8/30.  PCO2 notes improvement and patient breathing more comfortably.  Patient on 3 L which she needs with ambulation.  This may be her new baseline.  Cleared by pulmonary.  Shock (HCC)-resolved as of 03/10/2022 Septic versus cardiogenic shock -resolved. Briefly required pressors on admission to ICU. Now off pressors. -- Monitor BP closely -- Treat CHF and infection as outlined -- Maintain MAP over 65 -- Home Coreg, torsemide and ARB are on hold, resume when BP will tolerate  Severe sepsis (HCC)-resolved as of 03/10/2022 .Patient met SIRS criteria on admission with leukocytosis and tachypnea in setting of suspected UTI versus cellulitis.  Unclear if shock is cardiogenic or septic in nature, cannot rule out septic shock but clinically undetermined.  Sepsis itself stabilized. -- Sepsis physiology improved which was de-escalated to Rocephin. Procalcitonin and white blood cell count continue to trend downward  Lymphedema of both lower extremities With increased swelling to the point of weeping and blister formation from  fluid, erythema bilaterally consistent with stasis dermatitis and lymphedema.  Will continue antibiotic coverage given open skin wounds. -- De-escalated antibiotics to Rocephin.  Seen by wound care and checking ABIs. -- Continue diuresis  Acute metabolic encephalopathy-resolved as of  03/10/2022 POA, likely due to hypoxia and infection.  Hypercarbia may contribute but pH is compensated.  Ammonia level normal.  No focal neurologic deficits.   ICU environment contributes.  Avoiding sedating medications.  Has been feels close to baseline and should improve even further with treatment of worsening hypercarbia.  PAF (paroxysmal atrial fibrillation) (HCC) Continue Eliquis.  Coreg on hold until BP improves.  Monitor on telemetry.  Had brief episode on 8/29 due to worsening respiratory failure.  Acute kidney injury in the setting of stage 3a chronic kidney disease ([place]) AKI superimposed on CKD IIIa Presented with creatinine 1.97 in the setting of decompensated CHF and volume overload, probable infection, likely prerenal AKI.  Renal function close to baseline.  Monitor while aggressively diuresing.  OSA (obstructive sleep apnea) Uses trilogy/NIV at home.   BiPAP at night  Elevated troponin Mild and flat trend.  Likely demand ischemia in the setting of decompensated CHF and shock.  Patient is chest pain-free and EKG non-acute.  Type 2 diabetes mellitus with diabetic neuropathy, without long-term current use of insulin (HCC) Continue sliding scale NovoLog  Benign essential hypertension Presented in shock requiring vasopressors.  BP has stabilized but home antihypertensives are still on hold.  Resume when BP will tolerate.  Hypothyroidism Continue Synthroid  HLD (hyperlipidemia) Continue statin  Hypokalemia-resolved as of 03/10/2022 Replacing oral KCl for K3.3 this morning.  Monitor BMP, replace K as needed.  Morbid obesity with BMI of 50.0-59.9, adult (HCC) Body mass index is 58.31 kg/m. Complicates overall care and prognosis.       Body mass index is 55.63 kg/m.        Consultants: Critical care  Procedures: Echocardiogram done 8/25: Preserved diastolic function.  Ejection fraction 45% ABIs pending  Antimicrobials: IV Rocephin 8/27-present IV cefepime  and vancomycin 8/25 - 8/26   Code Status: Full code   Subjective: Patient doing okay, breathing comfortably  Objective: Noted hypoxia Vitals:   03/14/22 1243 03/14/22 1249  BP: (!) 110/51   Pulse: (!) 58 87  Resp: 18   Temp: 98.6 F (37 C)   SpO2: 96% 95%    Intake/Output Summary (Last 24 hours) at 03/14/2022 1319 Last data filed at 03/14/2022 1051 Gross per 24 hour  Intake 480 ml  Output 600 ml  Net -120 ml    Filed Weights   03/12/22 0343 03/13/22 0500 03/14/22 0500  Weight: (!) 153.1 kg (!) 149.3 kg (!) 147 kg   Body mass index is 55.63 kg/m.  Exam:  General: Alert and oriented x3, no acute distress HEENT: Normocephalic, atraumatic, narrow airway Cardiovascular: Irregular rhythm, rate controlled Respiratory: Decreased breath sounds throughout secondary to body habitus Abdomen: Soft, obese, nontender, positive bowel sounds Musculoskeletal: No clubbing or cyanosis, 1-2+ pitting edema Skin: No breaks, tears or lesion, bilateral erythema Psychiatry: Appropriate, no evidence of psychoses and Neurology: No focal deficits  Data Reviewed: BNP of 651.  Procalcitonin of 0.18.  Disposition:  Status is: Inpatient Remains inpatient appropriate because:  Approval for skilled nursing Finishing diuresis  Anticipated discharge date: 9/5, going to skilled nursing  Family Communication: Left message for husband DVT Prophylaxis:  apixaban (ELIQUIS) tablet 5 mg   I have spent 35 minutes in the care of this critically ill patient including review  of patient's labs and films, medical decision making and bedside examination.  Author: Annita Brod ,MD 03/14/2022 1:19 PM  To reach On-call, see care teams to locate the attending and reach out via [email or web address]. Between 7PM-7AM, please contact night-coverage If you still have difficulty reaching the attending provider, please page the [facility] (Director on Call) for [facility] on amion for assistance.

## 2022-03-15 DIAGNOSIS — I5023 Acute on chronic systolic (congestive) heart failure: Secondary | ICD-10-CM | POA: Diagnosis not present

## 2022-03-15 DIAGNOSIS — J9621 Acute and chronic respiratory failure with hypoxia: Secondary | ICD-10-CM | POA: Diagnosis not present

## 2022-03-15 DIAGNOSIS — G4733 Obstructive sleep apnea (adult) (pediatric): Secondary | ICD-10-CM | POA: Diagnosis not present

## 2022-03-15 LAB — BASIC METABOLIC PANEL
Anion gap: 9 (ref 5–15)
BUN: 21 mg/dL (ref 8–23)
CO2: 38 mmol/L — ABNORMAL HIGH (ref 22–32)
Calcium: 8.2 mg/dL — ABNORMAL LOW (ref 8.9–10.3)
Chloride: 95 mmol/L — ABNORMAL LOW (ref 98–111)
Creatinine, Ser: 0.92 mg/dL (ref 0.44–1.00)
GFR, Estimated: >60 mL/min (ref >60)
Glucose, Bld: 102 mg/dL — ABNORMAL HIGH (ref 70–99)
Potassium: 3.7 mmol/L (ref 3.5–5.1)
Sodium: 142 mmol/L (ref 135–145)

## 2022-03-15 LAB — GLUCOSE, CAPILLARY: Glucose-Capillary: 112 mg/dL — ABNORMAL HIGH (ref 70–99)

## 2022-03-15 NOTE — Plan of Care (Signed)

## 2022-03-15 NOTE — Progress Notes (Signed)
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   Patient was admitted to ICU with Acute Hypoxic Hypercapnic Respiratory Failure secondary to CHF decompensation requiring BiPAP, along with severe Sepsis with septic shock in setting of UTI vs ?cellulitis of bilateral lower extremities vs stasis dermatitis due to peripheral edema.  She has open superficial wound on each lower leg from fluid weeping which formed blisters.  Stabilized and patient transferred to hospitalist service on 8/27.    Since then, patient has been receiving diuresis over 6.5 L.  Over the past 2 days, patient's oxygen requirement slightly increased.  And on 8/29, patient noted to have low-grade fever.  Had episode of brief tachycardia and ABG done noted worsening hypercarbia and hypoxia.  Patient placed on BiPAP.  By 8/30, doing well and able to maintain on 3 L nasal cannula.    Assessment and Plan: Assessment and Plan: * Acute on chronic systolic CHF (congestive heart failure) ([place]) Presented with progressive dyspnea, hypoxia, lower extremity edema, BNP 955 and CXR with cardiomegaly and central vascular congestion with mild interstitial edema.  Initially diuretics held due to hypotension and shock. Lasix started once patient's blood pressure stabilized.  Lasix increased to 40 twice daily on 8/29.  Also with Aldactone.  She has since diuresed almost 12 L and is more than -8.5 L deficient.   Home torsemide on hold.  Given morbid obesity, BNP is likely significantly likely underestimating degree of heart failure.  BNP still elevated at 630 and patient with 147 kg, was 140 kg at an office visit at the beginning of  the month.  Continue IV Lasix.  Acute on chronic respiratory failure with hypoxia and hypercapnia (HCC) At baseline, patient uses trilogy NIV at home.  Presented with worsening shortness of breath and hypoxia requiring BiPAP.  Supplemental O2 to maintain sats 88 to 93%.  In review of patient's vitals, oxygen levels in the last day have required 4 to 5 L, but oxygen saturations have been higher than needed, greater than 95% which may be precipitating cause for acute respiratory failure and hypercarbia currently.  Patient changed over to BiPAP.  Able to be weaned off of BiPAP during the day by 8/30.  PCO2 notes improvement and patient breathing more comfortably.  Patient on 3 L which she needs with ambulation.  This may be her new baseline.  Cleared by pulmonary.  Shock (HCC)-resolved as of 03/10/2022 Septic versus cardiogenic shock -resolved. Briefly required pressors on admission to ICU. Now off pressors. -- Monitor BP closely -- Treat CHF and infection as outlined -- Maintain MAP over 65 -- Home Coreg, torsemide and ARB are on hold, resume when BP will tolerate  Severe sepsis (HCC)-resolved as of 03/10/2022 .Patient met SIRS criteria on admission with leukocytosis and tachypnea in setting of suspected UTI versus cellulitis.  Unclear if shock is cardiogenic or septic in nature, cannot rule out septic shock but clinically undetermined.  Sepsis itself stabilized. -- Sepsis physiology improved which was de-escalated to Rocephin. Procalcitonin and white blood cell count continue to trend downward  Lymphedema of both lower extremities With increased swelling to the point of weeping and blister formation from  fluid, erythema bilaterally consistent with stasis dermatitis and lymphedema.  Will continue antibiotic coverage given open skin wounds. -- De-escalated antibiotics to Rocephin.  Seen by wound care and checking ABIs. -- Continue diuresis  Acute metabolic encephalopathy-resolved as of  03/10/2022 POA, likely due to hypoxia and infection.  Hypercarbia may contribute but pH is compensated.  Ammonia level normal.  No focal neurologic deficits.   ICU environment contributes.  Avoiding sedating medications.  Has been feels close to baseline and should improve even further with treatment of worsening hypercarbia.  PAF (paroxysmal atrial fibrillation) (HCC) Continue Eliquis.  Coreg on hold until BP improves.  Monitor on telemetry.  Had brief episode on 8/29 due to worsening respiratory failure.  Acute kidney injury in the setting of stage 3a chronic kidney disease ([place]) AKI superimposed on CKD IIIa Presented with creatinine 1.97 in the setting of decompensated CHF and volume overload, probable infection, likely prerenal AKI.  Currently better than baseline, indicating still room to diurese.  Continue to monitor labs.  OSA (obstructive sleep apnea) Uses trilogy/NIV at home.   BiPAP at night  Elevated troponin Mild and flat trend.  Likely demand ischemia in the setting of decompensated CHF and shock.  Patient is chest pain-free and EKG non-acute.  Type 2 diabetes mellitus with diabetic neuropathy, without long-term current use of insulin (HCC) Continue sliding scale NovoLog  Benign essential hypertension Presented in shock requiring vasopressors.  BP has stabilized but home antihypertensives are still on hold.  Resume when BP will tolerate.  Hypothyroidism Continue Synthroid  HLD (hyperlipidemia) Continue statin  Hypokalemia-resolved as of 03/10/2022 Replacing oral KCl for K3.3 this morning.  Monitor BMP, replace K as needed.  Morbid obesity with BMI of 50.0-59.9, adult (HCC) Body mass index is 58.31 kg/m. Complicates overall care and prognosis.       Body mass index is 55.51 kg/m.        Consultants: Critical care  Procedures: Echocardiogram done 8/25: Preserved diastolic function.  Ejection fraction 45% ABIs unremarkable  Antimicrobials: IV Rocephin  8/27-present IV cefepime and vancomycin 8/25 - 8/26   Code Status: Full code   Subjective: Overall patient okay.  A little upset about the amount of fluid retained.  Objective: Noted hypoxia Vitals:   03/15/22 0942 03/15/22 1203  BP: (!) 150/60 (!) 147/60  Pulse: 65 90  Resp: 20 18  Temp: 98.4 F (36.9 C) 98.8 F (37.1 C)  SpO2: 97% 96%    Intake/Output Summary (Last 24 hours) at 03/15/2022 1304 Last data filed at 03/15/2022 0947 Gross per 24 hour  Intake 720 ml  Output 450 ml  Net 270 ml    Filed Weights   03/14/22 0500 03/15/22 0500 03/15/22 0904  Weight: (!) 147 kg (!) 147.1 kg (!) 146.7 kg   Body mass index is 55.51 kg/m.  Exam:  General: Alert and oriented x3, no acute distress HEENT: Normocephalic, atraumatic, narrow airway Cardiovascular: Irregular rhythm, rate controlled Respiratory: Decreased breath sounds throughout secondary to body habitus Abdomen: Soft, obese, nontender, positive bowel sounds Musculoskeletal: No clubbing or cyanosis, 1-2+ pitting edema Skin: No breaks, tears or lesion, bilateral erythema Psychiatry: Appropriate, no evidence of psychoses and Neurology: No focal deficits  Data Reviewed: Renal function normal.  Bicarb of 38  Disposition:  Status is: Inpatient Remains inpatient appropriate because:  Approval for skilled nursing Finishing diuresis  Anticipated discharge date: 9/5, going to skilled nursing  Family Communication: Updated husband by phone DVT Prophylaxis:  apixaban (ELIQUIS) tablet 5 mg  I have spent 35 minutes in the care of this critically ill patient including review of patient's labs and films, medical decision making and bedside examination.  Author: Annita Brod ,MD 03/15/2022 1:04 PM  To reach On-call, see care teams to locate the attending and reach out via [email or web address]. Between 7PM-7AM, please contact night-coverage If you still have difficulty reaching the attending provider, please page the [facility]  (Director on Call) for [facility] on amion for assistance.

## 2022-03-16 DIAGNOSIS — I5023 Acute on chronic systolic (congestive) heart failure: Secondary | ICD-10-CM | POA: Diagnosis not present

## 2022-03-16 DIAGNOSIS — J9621 Acute and chronic respiratory failure with hypoxia: Secondary | ICD-10-CM | POA: Diagnosis not present

## 2022-03-16 DIAGNOSIS — G4733 Obstructive sleep apnea (adult) (pediatric): Secondary | ICD-10-CM | POA: Diagnosis not present

## 2022-03-16 LAB — BASIC METABOLIC PANEL
Anion gap: 8 (ref 5–15)
BUN: 20 mg/dL (ref 8–23)
CO2: 40 mmol/L — ABNORMAL HIGH (ref 22–32)
Calcium: 8.2 mg/dL — ABNORMAL LOW (ref 8.9–10.3)
Chloride: 95 mmol/L — ABNORMAL LOW (ref 98–111)
Creatinine, Ser: 0.97 mg/dL (ref 0.44–1.00)
GFR, Estimated: >60 mL/min (ref >60)
Glucose, Bld: 114 mg/dL — ABNORMAL HIGH (ref 70–99)
Potassium: 3.6 mmol/L (ref 3.5–5.1)
Sodium: 143 mmol/L (ref 135–145)

## 2022-03-16 MED ORDER — LIDOCAINE 5 % EX PTCH
1.0000 | MEDICATED_PATCH | CUTANEOUS | Status: DC
  Administered 2022-03-16 – 2022-03-18 (×3): 1 via TRANSDERMAL
  Filled 2022-03-16 (×4): qty 1

## 2022-03-16 MED ORDER — KETOROLAC TROMETHAMINE 15 MG/ML IJ SOLN
15.0000 mg | Freq: Once | INTRAMUSCULAR | Status: AC
  Administered 2022-03-16: 15 mg via INTRAVENOUS
  Filled 2022-03-16: qty 1

## 2022-03-16 NOTE — Progress Notes (Signed)
       CROSS COVER NOTE  NAME: Madison Mcbride MRN: [id number] DOB : Nov 19, 1955    Date of Service   03/16/2022   HPI/Events of Note   Medication request received for 7/10 chronic back pain unrelieved by Percocet.  Interventions   Plan: Lidocaine patch Toradol x1     This document was prepared using Dragon voice recognition software and may include unintentional dictation errors.  Neomia Glass DNP, MHA, FNP-BC Nurse Practitioner [facility] Pager [phone]

## 2022-03-16 NOTE — Progress Notes (Signed)
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   Patient was admitted to ICU with Acute Hypoxic Hypercapnic Respiratory Failure secondary to CHF decompensation requiring BiPAP, along with severe Sepsis with septic shock in setting of UTI vs ?cellulitis of bilateral lower extremities vs stasis dermatitis due to peripheral edema.  She has open superficial wound on each lower leg from fluid weeping which formed blisters.  Stabilized and patient transferred to hospitalist service on 8/27.    Since then, patient has been receiving diuresis over 6.5 L.  Over the past 2 days, patient's oxygen requirement slightly increased.  And on 8/29, patient noted to have low-grade fever.  Had episode of brief tachycardia and ABG done noted worsening hypercarbia and hypoxia.  Patient placed on BiPAP.  By 8/30, doing well and able to maintain on 3 L nasal cannula.    Assessment and Plan: Assessment and Plan: * Acute on chronic systolic CHF (congestive heart failure) ([place]) Presented with progressive dyspnea, hypoxia, lower extremity edema, BNP 955 and CXR with cardiomegaly and central vascular congestion with mild interstitial edema.  Initially diuretics held due to hypotension and shock. Lasix started once patient's blood pressure stabilized.  Lasix increased to 40 twice daily on 8/29.  Also with Aldactone.  She has since diuresed almost 12 L and is more than -8.5 L deficient.   Home torsemide on hold.  Given morbid obesity, BNP is likely significantly likely underestimating degree of heart failure.  BNP still elevated at 630 and patient with 146 kg, was 140 kg at an office visit at the beginning of  the month.  Continue IV Lasix.  Acute on chronic respiratory failure with hypoxia and hypercapnia (HCC) At baseline, patient uses trilogy NIV at home.  Presented with worsening shortness of breath and hypoxia requiring BiPAP.  Supplemental O2 to maintain sats 88 to 93%.  In review of patient's vitals, oxygen levels in the last day have required 4 to 5 L, but oxygen saturations have been higher than needed, greater than 95% which may be precipitating cause for acute respiratory failure and hypercarbia currently.  Patient changed over to BiPAP.  Able to be weaned off of BiPAP during the day by 8/30.  PCO2 notes improvement and patient breathing more comfortably.  Patient on 3 L which she needs with ambulation.  This may be her new baseline.  Cleared by pulmonary.  Shock (HCC)-resolved as of 03/10/2022 Septic versus cardiogenic shock -resolved. Briefly required pressors on admission to ICU. Now off pressors. -- Monitor BP closely -- Treat CHF and infection as outlined -- Maintain MAP over 65 -- Home Coreg, torsemide and ARB are on hold, resume when BP will tolerate  Severe sepsis (HCC)-resolved as of 03/10/2022 .Patient met SIRS criteria on admission with leukocytosis and tachypnea in setting of suspected UTI versus cellulitis.  Unclear if shock is cardiogenic or septic in nature, cannot rule out septic shock but clinically undetermined.  Sepsis itself stabilized. -- Sepsis physiology improved which was de-escalated to Rocephin. Procalcitonin and white blood cell count continue to trend downward  Lymphedema of both lower extremities With increased swelling to the point of weeping and blister formation from  fluid, erythema bilaterally consistent with stasis dermatitis and lymphedema.  Will continue antibiotic coverage given open skin wounds. -- De-escalated antibiotics to Rocephin.  Seen by wound care and checking ABIs. -- Continue diuresis  Acute metabolic encephalopathy-resolved as of  03/10/2022 POA, likely due to hypoxia and infection.  Hypercarbia may contribute but pH is compensated.  Ammonia level normal.  No focal neurologic deficits.   ICU environment contributes.  Avoiding sedating medications.  Has been feels close to baseline and should improve even further with treatment of worsening hypercarbia.  PAF (paroxysmal atrial fibrillation) (HCC) Continue Eliquis.  Coreg on hold until BP improves.  Monitor on telemetry.  Had brief episode on 8/29 due to worsening respiratory failure.  Acute kidney injury in the setting of stage 3a chronic kidney disease ([place]) AKI superimposed on CKD IIIa Presented with creatinine 1.97 in the setting of decompensated CHF and volume overload, probable infection, likely prerenal AKI.  Currently better than baseline, indicating still room to diurese.  Continue to monitor labs.  OSA (obstructive sleep apnea) Uses trilogy/NIV at home.   BiPAP at night  Elevated troponin Mild and flat trend.  Likely demand ischemia in the setting of decompensated CHF and shock.  Patient is chest pain-free and EKG non-acute.  Type 2 diabetes mellitus with diabetic neuropathy, without long-term current use of insulin (HCC) Continue sliding scale NovoLog  Benign essential hypertension Presented in shock requiring vasopressors.  BP has stabilized but home antihypertensives are still on hold.  Resume when BP will tolerate.  Hypothyroidism Continue Synthroid  HLD (hyperlipidemia) Continue statin  Hypokalemia-resolved as of 03/10/2022 Replacing oral KCl for K3.3 this morning.  Monitor BMP, replace K as needed.  Morbid obesity with BMI of 50.0-59.9, adult (HCC) Body mass index is 58.31 kg/m. Complicates overall care and prognosis.       Body mass index is 55.25 kg/m.        Consultants: Critical care  Procedures: Echocardiogram done 8/25: Preserved diastolic function.  Ejection fraction 45% ABIs unremarkable  Antimicrobials: IV Rocephin  8/27-present IV cefepime and vancomycin 8/25 - 8/26   Code Status: Full code   Subjective: Feeling better, no complaints  Objective: Noted hypoxia Vitals:   03/16/22 0616 03/16/22 0809  BP: 121/71 126/65  Pulse: 60 65  Resp: 18 20  Temp: 98.6 F (37 C) 99.2 F (37.3 C)  SpO2: 96% 96%    Intake/Output Summary (Last 24 hours) at 03/16/2022 1300 Last data filed at 03/16/2022 1050 Gross per 24 hour  Intake --  Output 2400 ml  Net -2400 ml    Filed Weights   03/15/22 0500 03/15/22 0904 03/16/22 0500  Weight: (!) 147.1 kg (!) 146.7 kg (!) 146 kg   Body mass index is 55.25 kg/m.  Exam:  General: Alert and oriented x3, no acute distress HEENT: Normocephalic, atraumatic, narrow airway Cardiovascular: Irregular rhythm, rate controlled Respiratory: Decreased breath sounds throughout secondary to body habitus Abdomen: Soft, obese, nontender, positive bowel sounds Musculoskeletal: No clubbing or cyanosis, 1-2+ pitting edema Skin: No breaks, tears or lesion, bilateral erythema Psychiatry: Appropriate, no evidence of psychoses and Neurology: No focal deficits  Data Reviewed: Renal function normal.  Chloride at 95 and bicarb of 40  Disposition:  Status is: Inpatient Remains inpatient appropriate because:  Approval for skilled nursing Continuing IV diuresis  Anticipated discharge date: 9/5 or 9 6, when accepted to skilled nursing  Family Communication: Left message for husband DVT Prophylaxis:  apixaban (ELIQUIS) tablet 5 mg   I have spent  35 minutes in the care of this critically ill patient including review of patient's labs and films, medical decision making and bedside examination.  Author: Annita Brod ,MD 03/16/2022 1:00 PM  To reach On-call, see care teams to locate the attending and reach out via [email or web address]. Between 7PM-7AM, please contact night-coverage If you still have difficulty reaching the attending provider, please page the [facility] (Director on  Call) for [facility] on amion for assistance.

## 2022-03-16 NOTE — TOC Progression Note (Signed)
Transition of Care [facility]) - Progression Note    Patient Details  Name: Madison Mcbride MRN: [id number] Date of Birth: May 25, 1956  Transition of Care [facility]) CM/SW Contact  Laurena Slimmer, RN Phone Number: 03/16/2022, 9:32 AM  Clinical Narrative:    No bed offers. Bed search extended.    Expected Discharge Plan: [place] Barriers to Discharge: Continued Medical Work up  Expected Discharge Plan and Services Expected Discharge Plan: [place]   Discharge Planning Services: CM Consult Post Acute Care Choice: [place] arrangements for the past 2 months: Single Family Home                 DME Arranged: N/A DME Agency: NA       HH Arranged: RN, PT, OT HH Agency: [place] Date [place]: 03/07/22 Time [place]: 1624     Social Determinants of Health (SDOH) Interventions    Readmission Risk Interventions    03/07/2022    4:12 PM  Readmission Risk Prevention Plan  Transportation Screening Complete  PCP or Specialist Appt within 5-7 Days Complete  Home Care Screening Complete  Medication Review (RN CM) Complete

## 2022-03-17 DIAGNOSIS — J9621 Acute and chronic respiratory failure with hypoxia: Secondary | ICD-10-CM | POA: Diagnosis not present

## 2022-03-17 DIAGNOSIS — I5023 Acute on chronic systolic (congestive) heart failure: Secondary | ICD-10-CM | POA: Diagnosis not present

## 2022-03-17 DIAGNOSIS — G4733 Obstructive sleep apnea (adult) (pediatric): Secondary | ICD-10-CM | POA: Diagnosis not present

## 2022-03-17 LAB — BASIC METABOLIC PANEL
Anion gap: 13 (ref 5–15)
BUN: 22 mg/dL (ref 8–23)
CO2: 38 mmol/L — ABNORMAL HIGH (ref 22–32)
Calcium: 8.6 mg/dL — ABNORMAL LOW (ref 8.9–10.3)
Chloride: 94 mmol/L — ABNORMAL LOW (ref 98–111)
Creatinine, Ser: 1.09 mg/dL — ABNORMAL HIGH (ref 0.44–1.00)
GFR, Estimated: 56 mL/min — ABNORMAL LOW (ref >60)
Glucose, Bld: 137 mg/dL — ABNORMAL HIGH (ref 70–99)
Potassium: 3.9 mmol/L (ref 3.5–5.1)
Sodium: 145 mmol/L (ref 135–145)

## 2022-03-17 LAB — CBC
HCT: 36.7 % (ref 36.0–46.0)
Hemoglobin: 10.1 g/dL — ABNORMAL LOW (ref 12.0–15.0)
MCH: 23.7 pg — ABNORMAL LOW (ref 26.0–34.0)
MCHC: 27.5 g/dL — ABNORMAL LOW (ref 30.0–36.0)
MCV: 85.9 fL (ref 80.0–100.0)
Platelets: 365 10*3/uL (ref 150–400)
RBC: 4.27 MIL/uL (ref 3.87–5.11)
RDW: 19.8 % — ABNORMAL HIGH (ref 11.5–15.5)
WBC: 13.7 10*3/uL — ABNORMAL HIGH (ref 4.0–10.5)
nRBC: 0 % (ref 0.0–0.2)

## 2022-03-17 NOTE — Progress Notes (Signed)
Occupational Therapy Treatment Patient Details Name: Madison Mcbride MRN: [id number] DOB: [birth year] Today's Date: 03/17/2022   History of present illness Pt is a 66 yo female that presented to the ED for SOB, weakness. Admitted to ICU for acaute on chronic hypoxic hypercapnic respiratory failure, sepsis due to wound infection.  PMH of COPD, CHF, DM, lymphedema, thyroid cancer, sleep apnea, afib, RLS.   OT comments  Upon entering the room, pt seated on [facility] and RN requesting assistance for transfer and hygiene. Pt scooting forward for therapist to assist with hygiene while seated. Pt standing with mod A of 2 and use of RW. Pt stands and pivots to bed with mod A needing min A for balance and assistance to manage RW. Pt seated on EOB while equipment set up and pt stands in same manner and transfers into recliner chair. RN remains in room to place purewick and all needs within reach. Pt is making progress towards goals.    Recommendations for follow up therapy are one component of a multi-disciplinary discharge planning process, led by the attending physician.  Recommendations may be updated based on patient status, additional functional criteria and insurance authorization.    Follow Up Recommendations  Skilled nursing-short term rehab (<3 hours/day)    Assistance Recommended at Discharge Intermittent Supervision/Assistance  Patient can return home with the following  A lot of help with walking and/or transfers;A lot of help with bathing/dressing/bathroom   Equipment Recommendations  Other (comment) (defer to next venue of care)       Precautions / Restrictions Precautions Precautions: Fall       Mobility   Transfers Overall transfer level: Needs assistance Equipment used: Rolling walker (2 wheels) Transfers: Sit to/from Stand, Bed to chair/wheelchair/BSC Sit to Stand: Mod assist, +2 physical assistance     Step pivot transfers: Mod assist           Balance Overall  balance assessment: Needs assistance Sitting-balance support: Feet supported Sitting balance-Leahy Scale: Good     Standing balance support: Bilateral upper extremity supported, During functional activity, Reliant on assistive device for balance Standing balance-Leahy Scale: Poor Standing balance comment: heavy lean on RW, vcs for technique, sequencing                           ADL either performed or assessed with clinical judgement   ADL Overall ADL's : Needs assistance/impaired                         Toilet Transfer: Moderate assistance;BSC/3in1;Rolling walker (2 wheels);+2 for physical assistance   Toileting- Clothing Manipulation and Hygiene: Maximal assistance Toileting - Clothing Manipulation Details (indicate cue type and reason): assistance for clothing and hygiene     Functional mobility during ADLs: Moderate assistance;+2 for physical assistance;+2 for safety/equipment      Extremity/Trunk Assessment Upper Extremity Assessment Upper Extremity Assessment: Generalized weakness   Lower Extremity Assessment Lower Extremity Assessment: Generalized weakness        Vision Baseline Vision/History: 1 Wears glasses Patient Visual Report: No change from baseline            Cognition Arousal/Alertness: Awake/alert Behavior During Therapy: WFL for tasks assessed/performed Overall Cognitive Status: Within Functional Limits for tasks assessed                                 General Comments: pleasant  and cooperative throughout session                   Pertinent Vitals/ Pain       Pain Assessment Pain Assessment: Faces Faces Pain Scale: Hurts a little bit Pain Location: L knee Pain Descriptors / Indicators: Discomfort Pain Intervention(s): Monitored during session, Repositioned         Frequency  Min 2X/week        Progress Toward Goals  OT Goals(current goals can now be found in the care plan section)   Progress towards OT goals: Progressing toward goals  Acute Rehab OT Goals Patient Stated Goal: to return home to PLOF OT Goal Formulation: With patient Time For Goal Achievement: 03/21/22 Potential to Achieve Goals: Good  Plan Discharge plan needs to be updated       AM-PAC OT "6 Clicks" Daily Activity     Outcome Measure   Help from another person eating meals?: A Little Help from another person taking care of personal grooming?: A Little Help from another person toileting, which includes using toliet, bedpan, or urinal?: A Lot Help from another person bathing (including washing, rinsing, drying)?: A Lot Help from another person to put on and taking off regular upper body clothing?: A Lot Help from another person to put on and taking off regular lower body clothing?: A Lot 6 Click Score: 14    End of Session Equipment Utilized During Treatment: Oxygen  OT Visit Diagnosis: Muscle weakness (generalized) (M62.81);Other abnormalities of gait and mobility (R26.89)   Activity Tolerance Patient tolerated treatment well   Patient Left in bed;with call bell/phone within reach;with bed alarm set   Nurse Communication Mobility status        Time: 1132-1150 OT Time Calculation (min): 18 min  Charges: OT General Charges $OT Visit: 1 Visit OT Treatments $Self Care/Home Management : 8-22 mins  Darleen Crocker, MS, OTR/L , CBIS ascom [phone]  03/17/22, 12:58 PM

## 2022-03-17 NOTE — Care Management Important Message (Signed)
Important Message  Patient Details  Name: Madison Mcbride MRN: [id number] Date of Birth: [birth year]   Medicare Important Message Given:  Yes     [name] [name] 03/17/2022, 1:26 PM

## 2022-03-17 NOTE — TOC Progression Note (Addendum)
Transition of Care [facility]) - Progression Note    Patient Details  Name: Madison Mcbride MRN: [id number] Date of Birth: 08-31-1955  Transition of Care [facility]) CM/SW Contact  Truddie Hidden, RN Phone Number: 03/17/2022, 9:37 AM  Clinical Narrative:    Spoke with patient to give bed offer for [facility]. Patient agreeable to bed offer. Advised Berkley Harvey would have to be obtained.   Patient not found in [employer] portal. Contacted Gena from [facility] to advise auth will need to be started by facility.    Expected Discharge Plan: Home w Home Health Services Barriers to Discharge: Continued Medical Work up  Expected Discharge Plan and Services Expected Discharge Plan: Home w Home Health Services   Discharge Planning Services: CM Consult Post Acute Care Choice: Home Health Living arrangements for the past 2 months: Single Family Home                 DME Arranged: N/A DME Agency: NA       HH Arranged: RN, PT, OT HH Agency: [facility] Date HH Agency Contacted: 03/07/22 Time HH Agency Contacted: [id number]     Social Determinants of Health (SDOH) Interventions    Readmission Risk Interventions    03/07/2022    4:12 PM  Readmission Risk Prevention Plan  Transportation Screening Complete  PCP or Specialist Appt within 5-7 Days Complete  Home Care Screening Complete  Medication Review (RN CM) Complete

## 2022-03-17 NOTE — Progress Notes (Signed)
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   Patient was admitted to ICU with Acute Hypoxic Hypercapnic Respiratory Failure secondary to CHF decompensation requiring BiPAP, along with severe Sepsis with septic shock in setting of UTI vs ?cellulitis of bilateral lower extremities vs stasis dermatitis due to peripheral edema.  She has open superficial wound on each lower leg from fluid weeping which formed blisters.  Stabilized and patient transferred to hospitalist service on 8/27.    Since then, patient has been receiving diuresis over 6.5 L.  Over the past 2 days, patient's oxygen requirement slightly increased.  And on 8/29, patient noted to have low-grade fever.  Had episode of brief tachycardia and ABG done noted worsening hypercarbia and hypoxia.  Patient placed on BiPAP.  By 8/30, doing well and able to maintain on 3-4 L nasal cannula.    Assessment and Plan: Assessment and Plan: * Acute on chronic systolic CHF (congestive heart failure) ([place]) Presented with progressive dyspnea, hypoxia, lower extremity edema, BNP 955 and CXR with cardiomegaly and central vascular congestion with mild interstitial edema.  Initially diuretics held due to hypotension and shock. Lasix started once patient's blood pressure stabilized.  Lasix increased to 40 twice daily on 8/29.  Also with Aldactone.  She has since diuresed almost 12 L and is more than -8.5 L deficient.   Home torsemide on hold.  Given morbid obesity, BNP is likely significantly likely underestimating degree of heart failure.  BNP still elevated at 630 and patient with 146 kg, was 140 kg at an office visit at the beginning of  the month.    Noted creatinine slightly increased on 9/5 and weight slightly went up.  May be reaching end of diuresis compared to previous weight, likely not yet fully diuresed.  We will continue Lasix.  If patient excepted to skilled nursing sooner than completion of diuresis, may end up discharging on increased dose of Lasix for several days, before decreasing back to home dose  Acute on chronic respiratory failure with hypoxia and hypercapnia ([place]) At baseline, patient uses trilogy NIV at home.  Presented with worsening shortness of breath and hypoxia requiring BiPAP.  Supplemental O2 to maintain sats 88 to 93%.  In review of patient's vitals, oxygen levels in the last day have required 4 to 5 L, but oxygen saturations have been higher than needed, greater than 95% which may be precipitating cause for acute respiratory failure and hypercarbia currently.  Patient changed over to BiPAP.  Able to be weaned off of BiPAP during the day by 8/30.  PCO2 notes improvement and patient breathing more comfortably.  Patient on 4 L which she needs with ambulation.  This may be her new baseline.  Cleared by pulmonary.  Shock (HCC)-resolved as of 03/10/2022 Septic versus cardiogenic shock -resolved. Briefly required pressors on admission to ICU. Now off pressors. -- Monitor BP closely -- Treat CHF and infection as outlined -- Maintain MAP over 65 -- Home Coreg, torsemide and ARB are on hold, resume when BP will tolerate  Severe sepsis (HCC)-resolved as of 03/10/2022 .Patient met SIRS criteria on admission with leukocytosis and tachypnea in setting of suspected UTI  versus cellulitis.  Unclear if shock is cardiogenic or septic in nature, cannot rule out septic shock but clinically undetermined.  Sepsis itself stabilized. -- Sepsis physiology improved which was de-escalated to Rocephin and completed antibiotic course.  Procalcitonin and white blood cell count have improved.  Recheck labs in the  morning.  Lymphedema of both lower extremities With increased swelling to the point of weeping and blister formation from fluid, erythema bilaterally consistent with stasis dermatitis and lymphedema.  Will continue antibiotic coverage given open skin wounds. -- De-escalated antibiotics to Rocephin.  Seen by wound care and checking ABIs. -- Continue diuresis  Acute metabolic encephalopathy-resolved as of 03/10/2022 POA, likely due to hypoxia and infection.  Hypercarbia may contribute but pH is compensated.  Ammonia level normal.  No focal neurologic deficits.   ICU environment contributes.  Avoiding sedating medications.  Has been feels close to baseline and should improve even further with treatment of worsening hypercarbia.  PAF (paroxysmal atrial fibrillation) (HCC) Continue Eliquis.  Coreg on hold until BP improves.  Monitor on telemetry.  Had brief episode on 8/29 due to worsening respiratory failure.  Acute kidney injury in the setting of stage 3a chronic kidney disease (HCC)-resolved as of 03/17/2022 AKI superimposed on CKD IIIa Presented with creatinine 1.97 in the setting of decompensated CHF and volume overload, probable infection, likely prerenal AKI.  Currently better than baseline, indicating still room to diurese.  Continue to monitor labs.  OSA (obstructive sleep apnea) Uses trilogy/NIV at home.  We will make sure in her discharge instructions that she will continue to use her home trilogy machine at skilled nursing. While here in the hospital, BiPAP at night  Elevated troponin Mild and flat trend.  Likely demand ischemia in the setting of decompensated CHF and shock.  Patient is chest pain-free and EKG non-acute.  Type 2 diabetes mellitus with diabetic neuropathy, without long-term current use of insulin (HCC) Continue sliding scale NovoLog  Benign essential hypertension Presented in shock requiring vasopressors.  BP has stabilized but home antihypertensives are still on  hold.  Resume when BP will tolerate.  Hypothyroidism Continue Synthroid  HLD (hyperlipidemia) Continue statin  Hypokalemia-resolved as of 03/10/2022 Replacing oral KCl for K3.3 this morning.  Monitor BMP, replace K as needed.  Morbid obesity with BMI of 50.0-59.9, adult (HCC) Body mass index is 58.31 kg/m. Complicates overall care and prognosis.       Body mass index is 55.59 kg/m.        Consultants: Critical care  Procedures: Echocardiogram done 8/25: Preserved diastolic function.  Ejection fraction 45% ABIs unremarkable  Antimicrobials: IV Rocephin 8/27-8/30 IV cefepime and vancomycin 8/25 - 8/26   Code Status: Full code   Subjective: Still easily winded when working with physical therapy, otherwise no complaints  Objective: Noted hypoxia Vitals:   03/16/22 2121 03/17/22 0436  BP: (!) 116/53 116/67  Pulse: 84 68  Resp: 20 18  Temp: 98.5 F (36.9 C) 97.8 F (36.6 C)  SpO2: 94% 98%    Intake/Output Summary (Last 24 hours) at 03/17/2022 1422 Last data filed at 03/17/2022 0438 Gross per 24 hour  Intake 2120 ml  Output 1300 ml  Net 820 ml   Filed Weights   03/15/22 0904 03/16/22 0500 03/17/22 0500  Weight: (!) 146.7 kg (!) 146 kg (!) 146.9 kg   Body mass index is 55.59 kg/m.  Exam:  General: Alert and oriented x3, no acute distress HEENT: Normocephalic, atraumatic, narrow airway Cardiovascular: Irregular rhythm, rate controlled Respiratory: Decreased  breath sounds throughout secondary to body habitus Abdomen: Soft, obese, nontender, positive bowel sounds Musculoskeletal: No clubbing or cyanosis, 1-2+ pitting edema Skin: No breaks, tears or lesion, bilateral erythema Psychiatry: Appropriate, no evidence of psychoses and Neurology: No focal deficits  Data Reviewed: Creatinine 1.09 with GFR 56.  Chloride of 94 and bicarb of 38.  White blood cell count was slightly increased at 13.7.  Disposition:  Status is: Inpatient Remains inpatient  appropriate because:  Approval for skilled nursing Continuing IV diuresis  Anticipated discharge date: 9/6 or 9/7, when accepted to skilled nursing  Family Communication: Left message for husband DVT Prophylaxis:  apixaban (ELIQUIS) tablet 5 mg   I have spent 35 minutes in the care of this critically ill patient including review of patient's labs and films, medical decision making and bedside examination.  Author: Annita Brod ,MD 03/17/2022 2:22 PM  To reach On-call, see care teams to locate the attending and reach out via [email or web address]. Between 7PM-7AM, please contact night-coverage If you still have difficulty reaching the attending provider, please page the [facility] (Director on Call) for [facility] on amion for assistance.

## 2022-03-17 NOTE — Progress Notes (Signed)
Physical Therapy Treatment Patient Details Name: Madison Mcbride MRN: [id number] DOB: 10/16/1955 Today's Date: 03/17/2022   History of Present Illness Pt is a 66 yo female that presented to the ED for SOB, weakness. Admitted to ICU for acaute on chronic hypoxic hypercapnic respiratory failure, sepsis due to wound infection.  PMH of COPD, CHF, DM, lymphedema, thyroid cancer, sleep apnea, afib, RLS.    PT Comments    Patient is agreeable to PT. She was seated in the recliner chair on arrival to room. She complains of low back and neck pain. She performed sit to stand from recliner with Max A of one person but required +2 for transfer from bed to chair. Verbal cues for safety and heavy reliance on rolling walker in standing. Limited standing tolerance and fatigue with minimal activity. Recommend to continue PT to maximize independence and facilitate return to prior level of function. SNF recommendation  is appropriate for discharge.    Recommendations for follow up therapy are one component of a multi-disciplinary discharge planning process, led by the attending physician.  Recommendations may be updated based on patient status, additional functional criteria and insurance authorization.  Follow Up Recommendations  Skilled nursing-short term rehab (<3 hours/day) Can patient physically be transported by private vehicle: No   Assistance Recommended at Discharge Frequent or constant Supervision/Assistance  Patient can return home with the following Two people to help with walking and/or transfers;Two people to help with bathing/dressing/bathroom;Help with stairs or ramp for entrance;Assist for transportation;Assistance with cooking/housework;Direct supervision/assist for financial management   Equipment Recommendations   (to be determined at next level of care)    Recommendations for Other Services       Precautions / Restrictions Precautions Precautions: Fall Restrictions Weight  Bearing Restrictions: No     Mobility  Bed Mobility Overal bed mobility: Needs Assistance Bed Mobility: Sit to Supine       Sit to supine: Max assist, +2 for physical assistance   General bed mobility comments: verbal cues for technique    Transfers Overall transfer level: Needs assistance Equipment used: Rolling walker (2 wheels) Transfers: Sit to/from Stand, Bed to chair/wheelchair/BSC Sit to Stand: Max assist   Step pivot transfers: Max assist, +2 physical assistance       General transfer comment: two transfers performed. patient was able to stand from the recliner with maximal assistance of one person. +2 person assistance required for stand step transfer from bed to recliner chair with several minutes seated rest break between bouts of standing due to fatigue and generalized weakness. verbal cues for hand placement and using momentum to stand    Ambulation/Gait               General Gait Details: unable to due to limited standing tolerance and fatigue with minimal activity   Stairs             Wheelchair Mobility    Modified Rankin (Stroke Patients Only)       Balance Overall balance assessment: Needs assistance Sitting-balance support: Feet supported Sitting balance-Leahy Scale: Good     Standing balance support: Bilateral upper extremity supported, During functional activity, Reliant on assistive device for balance Standing balance-Leahy Scale: Poor Standing balance comment: heavy use of rolling walker with external support from therapist required initially                            Cognition Arousal/Alertness: Awake/alert Behavior During Therapy: [facility] for tasks  assessed/performed Overall Cognitive Status: Within Functional Limits for tasks assessed                                 General Comments: mild anxiety associated with movement        Exercises      General Comments        Pertinent Vitals/Pain  Pain Assessment Pain Assessment: Faces Faces Pain Scale: Hurts little more Pain Location: lower back, neck Pain Descriptors / Indicators: Discomfort Pain Intervention(s): Limited activity within patient's tolerance, Monitored during session, Repositioned, Ice applied (ice pack for lower back provided at end of session)    Home Living                          Prior Function            PT Goals (current goals can now be found in the care plan section) Acute Rehab PT Goals Patient Stated Goal: regain strength PT Goal Formulation: With patient Time For Goal Achievement: 03/21/22 Potential to Achieve Goals: Fair Progress towards PT goals: Progressing toward goals    Frequency    Min 2X/week      PT Plan Current plan remains appropriate    Co-evaluation              AM-PAC PT "6 Clicks" Mobility   Outcome Measure  Help needed turning from your back to your side while in a flat bed without using bedrails?: Total Help needed moving from lying on your back to sitting on the side of a flat bed without using bedrails?: Total Help needed moving to and from a bed to a chair (including a wheelchair)?: Total Help needed standing up from a chair using your arms (e.g., wheelchair or bedside chair)?: Total Help needed to walk in hospital room?: Total Help needed climbing 3-5 steps with a railing? : Total 6 Click Score: 6    End of Session Equipment Utilized During Treatment: Oxygen Activity Tolerance: Patient limited by fatigue Patient left: in bed;with call bell/phone within reach;with bed alarm set Nurse Communication: Mobility status PT Visit Diagnosis: Difficulty in walking, not elsewhere classified (R26.2);Muscle weakness (generalized) (M62.81);Unsteadiness on feet (R26.81)     Time: 6295-2841 PT Time Calculation (min) (ACUTE ONLY): 43 min  Charges:  $Therapeutic Activity: 38-52 mins                     Minna Merritts, PT, MPT    Percell Locus 03/17/2022, 2:51 PM

## 2022-03-18 LAB — CBC
HCT: 34.6 % — ABNORMAL LOW (ref 36.0–46.0)
Hemoglobin: 9.1 g/dL — ABNORMAL LOW (ref 12.0–15.0)
MCH: 22.8 pg — ABNORMAL LOW (ref 26.0–34.0)
MCHC: 26.3 g/dL — ABNORMAL LOW (ref 30.0–36.0)
MCV: 86.7 fL (ref 80.0–100.0)
Platelets: 520 10*3/uL — ABNORMAL HIGH (ref 150–400)
RBC: 3.99 MIL/uL (ref 3.87–5.11)
RDW: 19.4 % — ABNORMAL HIGH (ref 11.5–15.5)
WBC: 12.3 10*3/uL — ABNORMAL HIGH (ref 4.0–10.5)
nRBC: 0 % (ref 0.0–0.2)

## 2022-03-18 LAB — BASIC METABOLIC PANEL
Anion gap: 11 (ref 5–15)
BUN: 24 mg/dL — ABNORMAL HIGH (ref 8–23)
CO2: 41 mmol/L — ABNORMAL HIGH (ref 22–32)
Calcium: 8.5 mg/dL — ABNORMAL LOW (ref 8.9–10.3)
Chloride: 95 mmol/L — ABNORMAL LOW (ref 98–111)
Creatinine, Ser: 1.08 mg/dL — ABNORMAL HIGH (ref 0.44–1.00)
GFR, Estimated: 56 mL/min — ABNORMAL LOW (ref >60)
Glucose, Bld: 115 mg/dL — ABNORMAL HIGH (ref 70–99)
Potassium: 3.8 mmol/L (ref 3.5–5.1)
Sodium: 147 mmol/L — ABNORMAL HIGH (ref 135–145)

## 2022-03-18 LAB — PROCALCITONIN: Procalcitonin: 0.19 ng/mL

## 2022-03-18 MED ORDER — ACETAMINOPHEN 325 MG PO TABS: 650.0000 mg | ORAL_TABLET | Freq: Four times a day (QID) | ORAL | Status: DC | PRN

## 2022-03-18 MED ORDER — GABAPENTIN 100 MG PO CAPS: 200.0000 mg | ORAL_CAPSULE | Freq: Two times a day (BID) | ORAL | 0 refills | Status: DC

## 2022-03-18 MED ORDER — ROPINIROLE HCL 3 MG PO TABS: 3.0000 mg | ORAL_TABLET | Freq: Every day | ORAL | 0 refills | Status: AC

## 2022-03-18 MED ORDER — SPIRONOLACTONE 25 MG PO TABS: 25.0000 mg | ORAL_TABLET | Freq: Every day | ORAL | 0 refills | Status: DC

## 2022-03-18 MED ORDER — LIDOCAINE 5 % EX PTCH: 1.0000 | MEDICATED_PATCH | CUTANEOUS | 0 refills | Status: DC

## 2022-03-18 MED ORDER — GABAPENTIN 300 MG PO CAPS: 300.0000 mg | ORAL_CAPSULE | Freq: Every day | ORAL | 0 refills | Status: DC

## 2022-03-18 MED ORDER — POLYETHYLENE GLYCOL 3350 17 G PO PACK: 17.0000 g | PACK | Freq: Every day | ORAL | 0 refills | Status: AC | PRN

## 2022-03-18 MED ORDER — CATHETER SELF-ADHESIVE URINARY MISC: 0 refills | Status: AC

## 2022-03-18 MED ORDER — ORAL CARE MOUTH RINSE: 15.0000 mL | OROMUCOSAL | 0 refills | Status: AC | PRN

## 2022-03-18 MED ORDER — DOCUSATE SODIUM 100 MG PO CAPS: 100.0000 mg | ORAL_CAPSULE | Freq: Two times a day (BID) | ORAL | 0 refills | Status: DC | PRN

## 2022-03-18 MED ORDER — OXYCODONE-ACETAMINOPHEN 7.5-325 MG PO TABS: 1.0000 | ORAL_TABLET | ORAL | 0 refills | Status: AC | PRN

## 2022-03-18 MED ORDER — ALPRAZOLAM 0.25 MG PO TABS: 0.2500 mg | ORAL_TABLET | Freq: Three times a day (TID) | ORAL | 0 refills | Status: DC | PRN

## 2022-03-18 MED ORDER — FUROSEMIDE 40 MG PO TABS: 40.0000 mg | ORAL_TABLET | Freq: Two times a day (BID) | ORAL | 0 refills | Status: AC

## 2022-03-18 MED ORDER — ROPINIROLE HCL 2 MG PO TABS: 2.0000 mg | ORAL_TABLET | Freq: Two times a day (BID) | ORAL | 0 refills | Status: DC

## 2022-03-18 NOTE — Progress Notes (Signed)
   03/17/22 2318  BiPAP/CPAP/SIPAP  $ Non-Invasive Ventilator  Non-Invasive Vent Subsequent  BiPAP/CPAP/SIPAP Pt Type Adult  Mask Type Full face mask  Mask Size Medium  Set Rate 12 breaths/min  Respiratory Rate 26 breaths/min  IPAP 18 cmH20  EPAP 8 cmH2O  Oxygen Percent 35 %  Minute Ventilation 10.5  [place]ak Inspiratory Pressure (PIP) 11  Tidal Volume (Vt) 411  BiPAP/CPAP/SIPAP BiPAP  Patient Home Equipment No  Auto Titrate No  Press High Alarm 25 cmH2O  Press Low Alarm 5 cmH2O

## 2022-03-18 NOTE — Discharge Summary (Signed)
Physician Discharge Summary   Patient: Madison Mcbride MRN: [id number]  DOB: Oct 24, 1955   Admit:     Date of Admission: 03/05/2022 Admitted from: home   Discharge: Date of discharge: 03/18/22 Disposition: Skilled nursing facility Condition at discharge: good  CODE STATUS: FULL     Discharge Physician: Emeterio Reeve, DO [facility]     PCP: Rusty Aus, MD  Recommendations for Outpatient Follow-up:  Follow up with PCP Rusty Aus, MD in 2-4 weeks Please obtain labs/tests: BMP in 2-5 days at SNF Please follow up on the following pending results: none PCP AND OTHER OUTPATIENT PROVIDERS: SEE [place] AVS PATIENT INFO    Discharge Instructions     Diet - low sodium heart healthy   Complete by: As directed    Discharge instructions   Complete by: As directed    Check BMP in 2-5 days to monitor on Lasix   Discharge wound care:   Complete by: As directed    Cleanse bilateral LE wounds with saline, pat dry Cover bilateral LE wounds with cut to fit piece of xeroform gauze, top with foam Wrap with Kerlix from toes to knees and secure with 4" ACE wraps from toes to knees Elevate legs as much as possible.  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    Patient was admitted to ICU with Acute Hypoxic Hypercapnic Respiratory Failure secondary to CHF decompensation requiring BiPAP, along with severe Sepsis with septic shock   Infectious: treated for sepsis in setting of UTI vs ?cellulitis of bilateral lower extremities vs stasis dermatitis due to peripheral edema.  She has open superficial wound on each lower leg from fluid weeping which formed blisters.  Shock (HCC)-resolved as of 03/10/2022. Off pressors. Unclear if shock was cardiogenic or septic in nature, could not rule out septic shock but clinically undetermined.  Sepsis itself stabilized. Sepsis physiology improved which was de-escalated to Rocephin and completed antibiotic course. Respiratory: Stabilized and patient transferred to hospitalist service on 8/27.  Since then, patient has been receiving diuresis. Patient's oxygen requirement slightly increased, 8/29, patient noted to have low-grade fever.  Had episode of brief tachycardia and ABG done noted worsening hypercarbia and hypoxia.  Patient placed on BiPAP. Able to be weaned off of BiPAP during the day by 8/30.  PCO2 notes improvement and patient breathing more comfortably.  Patient on 4 L which she needs with ambulation.  This may be her new baseline.  Cleared by pulmonary. CHF: Lasix increased to 40 twice daily on 8/29.  Also with Aldactone.  She has since diuresed almost 12 L and is more than -8.5 L deficient.   Home torsemide on hold.  Given morbid obesity, BNP is likely significantly likely underestimating degree of heart failure. Noted creatinine slightly increased on 9/5 and weight slightly went up.  May be reaching  end of diuresis compared to previous weight, likely not yet fully diuresed.  We will continue Lasix.  If patient excepted to skilled nursing sooner than completion of diuresis, may end up discharging on increased dose of Lasix for several days, before decreasing back to home dose. Net IO Since Admission: -12,402.29 mL [03/18/22 0929] Renal: AKI improved OSA: Uses trilogy/NIV at home.  We will make sure in her discharge instructions that she will continue to use her home trilogy machine at skilled  nursing. While here in the hospital, BiPAP at night  Consultants:  Critical Care / Pulmonary   Procedures: Echocardiogram done 8/25: Preserved diastolic function.  Ejection fraction 45% ABIs unremarkable   Antimicrobials: IV Rocephin 8/27-8/30 IV cefepime and vancomycin 8/25 - 8/26     ASSESSMENT & PLAN:      Discharge Diagnoses: Principal Problem:   Acute on chronic systolic CHF (congestive heart failure) (HCC) Active Problems:   Acute on chronic respiratory failure with hypoxia and hypercapnia (HCC)   Lymphedema of both lower extremities   PAF (paroxysmal atrial fibrillation) (HCC)   OSA (obstructive sleep apnea)   Elevated troponin   Type 2 diabetes mellitus with diabetic neuropathy, without long-term current use of insulin (HCC)   Hypothyroidism   Benign essential hypertension   HLD (hyperlipidemia)   Morbid obesity with BMI of 50.0-59.9, adult (HCC)     Acute on chronic systolic CHF (congestive heart failure) (HCC) Improved See Lasix instructions   Acute on chronic respiratory failure with hypoxia and hypercapnia (HCC) At baseline, patient uses trilogy NIV at home.   Supplemental O2 to maintain sats 88 to 93%.   Able to be weaned off of BiPAP during the day by 8/30.  Patient on 4 L which she needs with ambulation.  This may be her new baseline.  Cleared by pulmonary.  Shock ([place]) Septic versus cardiogenic shock -resolved. Briefly required pressors on admission to ICU. Home Coreg, torsemide and ARB are on hold, resume when BP will tolerate  Acute metabolic encephalopathy - resolved POA, likely due to hypoxia and infection.    Benign essential hypertension Presented in shock requiring vasopressors.   BP has stabilized but home antihypertensives are still on hold.   Resume when BP will tolerate.  Elevated troponin Mild and flat trend.  Likely demand ischemia in the setting of decompensated CHF and shock.    HLD (hyperlipidemia) Continue  statin  PAF (paroxysmal atrial fibrillation) (HCC) Continue Eliquis.   Coreg on hold until BP improves.   Monitor on telemetry.  Hypothyroidism Continue Synthroid  Lymphedema of both lower extremities De-escalated antibiotics to Rocephin.  Completed course  Seen by wound care and checking ABIs. Continue diuresis  Morbid obesity with BMI of 50.0-59.9, adult (HCC) Body mass index is 58.31 kg/m. Complicates overall care and prognosis.  OSA (obstructive sleep apnea) Uses trilogy/NIV at home.  We will make sure in her discharge instructions that she will continue to use her home trilogy machine at skilled nursing. While here in the hospital, BiPAP at night  Acute kidney injury in the setting of stage 3a chronic kidney disease ([place]) AKI superimposed on CKD IIIa Presented with creatinine 1.97 in the setting of decompensated CHF and volume overload, probable infection, likely prerenal AKI.   Currently better than baseline.   Continue to monitor labs.  Type 2 diabetes mellitus with diabetic neuropathy, without long-term current use of insulin (HCC) Continue sliding scale NovoLog  Severe sepsis ([place]) - resolved Patient met SIRS criteria on admission with  leukocytosis and tachypnea in setting of suspected UTI versus cellulitis.  Unclear if shock is cardiogenic or septic in nature, cannot rule out septic shock but clinically undetermined.  Sepsis itself stabilized. Follow VS and labs  Hypokalemia Monitor BMP, replace K as needed.    DVT prophylaxis: Eliquis Pertinent IV fluids/nutrition: n/a Central lines / invasive devices: n/a  Code Status: FULL Family Communication: none at this time, pt declined call to support person(s)   Disposition: inpatient for now, SNF planned [facility] needs: SNF placement  Barriers to discharge / significant pending items: SNF placement, continued diuresis                Discharge Instructions  Allergies as of 03/18/2022   No Known  Allergies      Medication List     STOP taking these medications    carvedilol 3.125 MG tablet Commonly known as: COREG   torsemide 20 MG tablet Commonly known as: DEMADEX       TAKE these medications    acetaminophen 325 MG tablet Commonly known as: TYLENOL Take 2 tablets (650 mg total) by mouth every 6 (six) hours as needed for mild pain, fever or headache.   ALPRAZolam 0.25 MG tablet Commonly known as: XANAX Take 1 tablet (0.25 mg total) by mouth 3 (three) times daily as needed for anxiety.   apixaban 5 MG Tabs tablet Commonly known as: ELIQUIS Take 1 tablet (5 mg total) by mouth 2 (two) times daily.   atorvastatin 20 MG tablet Commonly known as: LIPITOR Take 20 mg by mouth daily.   Biotin 2.5 MG Caps Take 2.5 mg by mouth daily.   buPROPion 150 MG 24 hr tablet Commonly known as: WELLBUTRIN XL Take 150 mg by mouth daily.   Catheter Self-Adhesive Urinary Misc Continue PurWIck prn if available   celecoxib 200 MG capsule Commonly known as: CELEBREX Take 200 mg by mouth 2 (two) times daily.   Cholecalciferol 25 MCG (1000 UT) tablet Take 1 tablet by mouth daily.   docusate sodium 100 MG capsule Commonly known as: COLACE Take 1 capsule (100 mg total) by mouth 2 (two) times daily as needed for mild constipation.   furosemide 40 MG tablet Commonly known as: Lasix Take 1 tablet (40 mg total) by mouth 2 (two) times daily. Increase to 1 tablet (40 mg total) by mouth THREE TIMES daily (total daily dose 120 mg) as needed for up to 3 days for increased leg swelling, shortness of breath, weight gain 5+ lbs over 1-2 days. Seek medical care if these symptoms are not improving with increased dose. Reduce dose to 1 tablet (40 mg total) by mouth ONCE DAILY if dizziness or low blood pressure on bid dose.   gabapentin 100 MG capsule Commonly known as: NEURONTIN Take 2 capsules (200 mg total) by mouth 2 (two) times daily. What changed: You were already taking a medication  with the same name, and this prescription was added. Make sure you understand how and when to take each.   gabapentin 300 MG capsule Commonly known as: NEURONTIN Take 1 capsule (300 mg total) by mouth at bedtime. What changed: when to take this   levothyroxine 125 MCG tablet Commonly known as: SYNTHROID Take 1 tablet (125 mcg total) by mouth daily.   lidocaine 5 % Commonly known as: LIDODERM Place 1 patch onto the skin daily. Remove & Discard patch within 12 hours or as directed by MD   mouth rinse Liqd solution 15 mLs by Mouth Rinse route  as needed (oral care).   multivitamin tablet Take 1 tablet by mouth daily.   oxyCODONE-acetaminophen 7.5-325 MG tablet Commonly known as: PERCOCET Take 1 tablet by mouth every 4 (four) hours as needed for up to 5 days for moderate pain or severe pain.   pantoprazole 40 MG tablet Commonly known as: PROTONIX Take 40 mg by mouth daily.   polyethylene glycol 17 g packet Commonly known as: MIRALAX / GLYCOLAX Take 17 g by mouth daily as needed for moderate constipation.   potassium chloride SA 20 MEQ tablet Commonly known as: KLOR-CON M Take 20 mEq by mouth daily.   rOPINIRole 3 MG tablet Commonly known as: REQUIP Take 1 tablet (3 mg total) by mouth at bedtime. What changed: You were already taking a medication with the same name, and this prescription was added. Make sure you understand how and when to take each.   rOPINIRole 2 MG tablet Commonly known as: REQUIP Take 1 tablet (2 mg total) by mouth 2 (two) times daily. What changed:  medication strength how much to take when to take this   Semaglutide (1 MG/DOSE) 2 MG/1.5ML Sopn Inject 0.75 mLs into the skin once a week.   spironolactone 25 MG tablet Commonly known as: ALDACTONE Take 1 tablet (25 mg total) by mouth daily. Start taking on: March 19, 2022   zinc gluconate 50 MG tablet Take 50 mg by mouth daily.               Durable Medical Equipment  (From  admission, onward)           Start     Ordered   03/18/22 0000  For home use only DME oxygen       Comments: Patient can use her home Trilogy device as directed qhs at home settings  Question Answer Comment  Length of Need Lifetime   Oxygen delivery system Gas      03/18/22 1154              Discharge Care Instructions  (From admission, onward)           Start     Ordered   03/18/22 0000  Discharge wound care:       Comments: Cleanse bilateral LE wounds with saline, pat dry Cover bilateral LE wounds with cut to fit piece of xeroform gauze, top with foam Wrap with Kerlix from toes to knees and secure with 4" ACE wraps from toes to knees Elevate legs as much as possible.  Change every other day  03/12/22 4650     03/18/22 1154             Contact information for after-discharge care     Destination     [facility] Preferred SNF .   Service: Skilled Nursing Contact information: [place] [phone]                     No Known Allergies   Subjective: pt feeling well today, no SOB, no chest pain. Feeling weak in general but a bit better from yesterday.    Discharge Exam: BP 127/75 (BP Location: Left Arm)   Pulse 74   Temp 98.9 F (37.2 C) (Oral)   Resp 20   Ht '5\' 4"'  (1.626 m)   Wt (!) 147.2 kg   SpO2 93%   BMI 55.70 kg/m  General: Pt is alert, awake, not in acute distress Cardiovascular: RRR, S1/S2 +,  no rubs, no gallops Respiratory: CTA bilaterally, no wheezing, no rhonchi, no rales Abdominal: Soft, NT, ND, bowel sounds + Extremities: Trace edema, somewhat limited exam d.t leg wrappings.  no cyanosis     The results of significant diagnostics from this hospitalization (including imaging, microbiology, ancillary and laboratory) are listed below for reference.     Microbiology: Recent Results (from the past 240 hour(s))  Resp Panel by RT-PCR (Flu A&B, Covid)  Anterior Nasal Swab     Status: None   Collection Time: 03/10/22  1:18 PM   Specimen: Anterior Nasal Swab  Result Value Ref Range Status   SARS Coronavirus 2 by RT PCR NEGATIVE NEGATIVE Final    Comment: (NOTE) SARS-CoV-2 target nucleic acids are NOT DETECTED.  The SARS-CoV-2 RNA is generally detectable in upper respiratory specimens during the acute phase of infection. The lowest concentration of SARS-CoV-2 viral copies this assay can detect is 138 copies/mL. A negative result does not preclude SARS-Cov-2 infection and should not be used as the sole basis for treatment or other patient management decisions. A negative result may occur with  improper specimen collection/handling, submission of specimen other than nasopharyngeal swab, presence of viral mutation(s) within the areas targeted by this assay, and inadequate number of viral copies(<138 copies/mL). A negative result must be combined with clinical observations, patient history, and epidemiological information. The expected result is Negative.  Fact Sheet for Patients:  EntrepreneurPulse.com.au  Fact Sheet for Healthcare Providers:  IncredibleEmployment.be  This test is no t yet approved or cleared by the [place] FDA and  has been authorized for detection and/or diagnosis of SARS-CoV-2 by FDA under an Emergency Use Authorization (EUA). This EUA will remain  in effect (meaning this test can be used) for the duration of the COVID-19 declaration under Section 564(b)(1) of the Act, 21 U.S.C.section 360bbb-3(b)(1), unless the authorization is terminated  or revoked sooner.       Influenza A by PCR NEGATIVE NEGATIVE Final   Influenza B by PCR NEGATIVE NEGATIVE Final    Comment: (NOTE) The Xpert Xpress SARS-CoV-2/FLU/RSV plus assay is intended as an aid in the diagnosis of influenza from Nasopharyngeal swab specimens and should not be used as a sole basis for treatment. Nasal washings  and aspirates are unacceptable for Xpert Xpress SARS-CoV-2/FLU/RSV testing.  Fact Sheet for Patients: EntrepreneurPulse.com.au  Fact Sheet for Healthcare Providers: IncredibleEmployment.be  This test is not yet approved or cleared by the [place] FDA and has been authorized for detection and/or diagnosis of SARS-CoV-2 by FDA under an Emergency Use Authorization (EUA). This EUA will remain in effect (meaning this test can be used) for the duration of the COVID-19 declaration under Section 564(b)(1) of the Act, 21 U.S.C. section 360bbb-3(b)(1), unless the authorization is terminated or revoked.  Performed at [facility], [address] [place], [place] [address]      Labs: BNP (last 3 results) Recent Labs    03/05/22 1743 03/10/22 1442 03/14/22 1139  BNP 955.0* 838.6* 902.4*   Basic Metabolic Panel: Recent Labs  Lab 03/14/22 0421 03/15/22 0328 03/16/22 0611 03/17/22 0418 03/18/22 0432  NA 143 142 143 145 147*  K 3.5 3.7 3.6 3.9 3.8  CL 97* 95* 95* 94* 95*  CO2 40* 38* 40* 38* 41*  GLUCOSE 93 102* 114* 137* 115*  BUN '22 21 20 22 ' 24*  CREATININE 0.94 0.92 0.97 1.09* 1.08*  CALCIUM 8.2* 8.2* 8.2* 8.6* 8.5*   Liver Function Tests: No results for input(s): "AST", "ALT", "  ALKPHOS", "BILITOT", "PROT", "ALBUMIN" in the last 168 hours. No results for input(s): "LIPASE", "AMYLASE" in the last 168 hours. No results for input(s): "AMMONIA" in the last 168 hours. CBC: Recent Labs  Lab 03/14/22 0421 03/17/22 0418 03/18/22 0432  WBC 12.9* 13.7* 12.3*  HGB 9.4* 10.1* 9.1*  HCT 34.7* 36.7 34.6*  MCV 85.3 85.9 86.7  PLT 401* 365 520*   Cardiac Enzymes: No results for input(s): "CKTOTAL", "CKMB", "CKMBINDEX", "TROPONINI" in the last 168 hours. BNP: Invalid input(s): "POCBNP" CBG: Recent Labs  Lab 03/14/22 0837 03/14/22 1212 03/14/22 1556 03/14/22 2021 03/15/22 0741  GLUCAP 86 115* 133* 125* 112*    D-Dimer No results for input(s): "DDIMER" in the last 72 hours. Hgb A1c No results for input(s): "HGBA1C" in the last 72 hours. Lipid Profile No results for input(s): "CHOL", "HDL", "LDLCALC", "TRIG", "CHOLHDL", "LDLDIRECT" in the last 72 hours. Thyroid function studies No results for input(s): "TSH", "T4TOTAL", "T3FREE", "THYROIDAB" in the last 72 hours.  Invalid input(s): "FREET3" Anemia work up No results for input(s): "VITAMINB12", "FOLATE", "FERRITIN", "TIBC", "IRON", "RETICCTPCT" in the last 72 hours. Urinalysis    Component Value Date/Time   COLORURINE YELLOW (A) 03/06/2022 1638   APPEARANCEUR CLEAR (A) 03/06/2022 1638   APPEARANCEUR CLOUDY 01/15/2014 0209   LABSPEC 1.009 03/06/2022 1638   LABSPEC 1.014 01/15/2014 0209   PHURINE 5.0 03/06/2022 1638   GLUCOSEU NEGATIVE 03/06/2022 1638   GLUCOSEU NEGATIVE 01/15/2014 0209   HGBUR SMALL (A) 03/06/2022 1638   BILIRUBINUR NEGATIVE 03/06/2022 1638   BILIRUBINUR NEGATIVE 01/15/2014 0209   KETONESUR NEGATIVE 03/06/2022 1638   PROTEINUR NEGATIVE 03/06/2022 1638   NITRITE NEGATIVE 03/06/2022 1638   LEUKOCYTESUR MODERATE (A) 03/06/2022 1638   LEUKOCYTESUR 2+ 01/15/2014 0209   Sepsis Labs Recent Labs  Lab 03/14/22 0421 03/17/22 0418 03/18/22 0432  WBC 12.9* 13.7* 12.3*   Microbiology Recent Results (from the past 240 hour(s))  Resp Panel by RT-PCR (Flu A&B, Covid) Anterior Nasal Swab     Status: None   Collection Time: 03/10/22  1:18 PM   Specimen: Anterior Nasal Swab  Result Value Ref Range Status   SARS Coronavirus 2 by RT PCR NEGATIVE NEGATIVE Final    Comment: (NOTE) SARS-CoV-2 target nucleic acids are NOT DETECTED.  The SARS-CoV-2 RNA is generally detectable in upper respiratory specimens during the acute phase of infection. The lowest concentration of SARS-CoV-2 viral copies this assay can detect is 138 copies/mL. A negative result does not preclude SARS-Cov-2 infection and should not be used as the sole  basis for treatment or other patient management decisions. A negative result may occur with  improper specimen collection/handling, submission of specimen other than nasopharyngeal swab, presence of viral mutation(s) within the areas targeted by this assay, and inadequate number of viral copies(<138 copies/mL). A negative result must be combined with clinical observations, patient history, and epidemiological information. The expected result is Negative.  Fact Sheet for Patients:  EntrepreneurPulse.com.au  Fact Sheet for Healthcare Providers:  IncredibleEmployment.be  This test is no t yet approved or cleared by the [place] FDA and  has been authorized for detection and/or diagnosis of SARS-CoV-2 by FDA under an Emergency Use Authorization (EUA). This EUA will remain  in effect (meaning this test can be used) for the duration of the COVID-19 declaration under Section 564(b)(1) of the Act, 21 U.S.C.section 360bbb-3(b)(1), unless the authorization is terminated  or revoked sooner.       Influenza A by PCR NEGATIVE NEGATIVE Final   Influenza B  by PCR NEGATIVE NEGATIVE Final    Comment: (NOTE) The Xpert Xpress SARS-CoV-2/FLU/RSV plus assay is intended as an aid in the diagnosis of influenza from Nasopharyngeal swab specimens and should not be used as a sole basis for treatment. Nasal washings and aspirates are unacceptable for Xpert Xpress SARS-CoV-2/FLU/RSV testing.  Fact Sheet for Patients: EntrepreneurPulse.com.au  Fact Sheet for Healthcare Providers: IncredibleEmployment.be  This test is not yet approved or cleared by the [place] FDA and has been authorized for detection and/or diagnosis of SARS-CoV-2 by FDA under an Emergency Use Authorization (EUA). This EUA will remain in effect (meaning this test can be used) for the duration of the COVID-19 declaration under Section 564(b)(1) of the Act,  21 U.S.C. section 360bbb-3(b)(1), unless the authorization is terminated or revoked.  Performed at [facility], [address], [place], [place] [address]    Imaging ECHOCARDIOGRAM COMPLETE  Result Date: 03/06/2022    ECHOCARDIOGRAM REPORT   Patient Name:   ZAMYA CULHANE Date of Exam: 03/06/2022 Medical Rec #:  [id number]             Height:       64.0 in Accession #:    [id number]            Weight:       315.9 lb Date of Birth:  1956-04-20             BSA:          2.376 m Patient Age:    1 years              BP:           127/67 mmHg Patient Gender: F                     HR:           59 bpm. Exam Location:  [facility] Procedure: 2D Echo, Color Doppler, Cardiac Doppler and Intracardiac            Opacification Agent Indications:     I50.31 congestive heart failure-Acute Diastolic; G53.64                  congestive heart failure-Acute Systolic  History:         Patient has prior history of Echocardiogram examinations, most                  recent 03/16/2021. CHF, Signs/Symptoms:Shortness of Breath, Chest                  Pain and Edema; Risk Factors:Sleep Apnea and Diabetes.  Sonographer:     Charmayne Sheer Referring Phys:  [place] Diagnosing Phys: Isaias Cowman MD  Sonographer Comments: Technically difficult study due to poor echo windows. Image acquisition challenging due to patient body habitus. IMPRESSIONS  1. Left ventricular ejection fraction, by estimation, is 45 to 50%. The left ventricle has mildly decreased function. The left ventricle has no regional wall motion abnormalities. Left ventricular diastolic parameters were normal.  2. Right ventricular systolic function is normal. The right ventricular size is normal.  3. The mitral valve is normal in structure. Mild to moderate mitral valve regurgitation. No evidence of mitral stenosis.  4. Tricuspid valve regurgitation is mild to moderate.  5. The aortic valve is normal in structure. Aortic valve regurgitation  is moderate. No aortic stenosis is present.  6. The inferior vena cava is normal in size with greater than  50% respiratory variability, suggesting right atrial pressure of 3 mmHg. FINDINGS  Left Ventricle: Left ventricular ejection fraction, by estimation, is 45 to 50%. The left ventricle has mildly decreased function. The left ventricle has no regional wall motion abnormalities. Definity contrast agent was given IV to delineate the left ventricular endocardial borders. The left ventricular internal cavity size was normal in size. There is no left ventricular hypertrophy. Left ventricular diastolic parameters were normal. Right Ventricle: The right ventricular size is normal. No increase in right ventricular wall thickness. Right ventricular systolic function is normal. Left Atrium: Left atrial size was normal in size. Right Atrium: Right atrial size was normal in size. Pericardium: There is no evidence of pericardial effusion. Mitral Valve: The mitral valve is normal in structure. Mild to moderate mitral valve regurgitation. No evidence of mitral valve stenosis. Tricuspid Valve: The tricuspid valve is normal in structure. Tricuspid valve regurgitation is mild to moderate. No evidence of tricuspid stenosis. Aortic Valve: The aortic valve is normal in structure. Aortic valve regurgitation is moderate. Aortic regurgitation PHT measures 624 msec. No aortic stenosis is present. Aortic valve mean gradient measures 8.0 mmHg. Aortic valve peak gradient measures 15.8 mmHg. Aortic valve area, by VTI measures 1.87 cm. Pulmonic Valve: The pulmonic valve was normal in structure. Pulmonic valve regurgitation is not visualized. No evidence of pulmonic stenosis. Aorta: The aortic root is normal in size and structure. Venous: The inferior vena cava is normal in size with greater than 50% respiratory variability, suggesting right atrial pressure of 3 mmHg. IAS/Shunts: No atrial level shunt detected by color flow Doppler.  LEFT  VENTRICLE PLAX 2D LVIDd:         5.48 cm      Diastology LVIDs:         4.53 cm      LV e' medial:    5.66 cm/s LV PW:         1.36 cm      LV E/e' medial:  21.7 LV IVS:        1.15 cm      LV e' lateral:   7.40 cm/s LVOT diam:     2.10 cm      LV E/e' lateral: 16.6 LV SV:         73 LV SV Index:   31 LVOT Area:     3.46 cm  LV Volumes (MOD) LV vol d, MOD A2C: 161.0 ml LV vol d, MOD A4C: 153.0 ml LV vol s, MOD A2C: 73.2 ml LV vol s, MOD A4C: 65.0 ml LV SV MOD A2C:     87.8 ml LV SV MOD A4C:     153.0 ml LV SV MOD BP:      88.1 ml RIGHT VENTRICLE RV Basal diam:  4.02 cm RV Mid diam:    4.67 cm LEFT ATRIUM             Index        RIGHT ATRIUM           Index LA diam:        4.10 cm 1.73 cm/m   RA Area:     15.00 cm LA Vol (A2C):   72.0 ml 30.30 ml/m  RA Volume:   34.10 ml  14.35 ml/m LA Vol (A4C):   48.5 ml 20.41 ml/m LA Biplane Vol: 60.5 ml 25.46 ml/m  AORTIC VALVE  PULMONIC VALVE AV Area (Vmax):    2.05 cm      PV Vmax:       1.40 m/s AV Area (Vmean):   2.05 cm      PV Peak grad:  7.8 mmHg AV Area (VTI):     1.87 cm AV Vmax:           199.00 cm/s AV Vmean:          131.000 cm/s AV VTI:            0.388 m AV Peak Grad:      15.8 mmHg AV Mean Grad:      8.0 mmHg LVOT Vmax:         118.00 cm/s LVOT Vmean:        77.400 cm/s LVOT VTI:          0.210 m LVOT/AV VTI ratio: 0.54 AI PHT:            624 msec  AORTA Ao Root diam: 4.50 cm MITRAL VALVE                TRICUSPID VALVE MV Area (PHT): 2.86 cm     TR Peak grad:   26.4 mmHg MV Decel Time: 265 msec     TR Vmax:        257.00 cm/s MV E velocity: 123.00 cm/s MV A velocity: 102.00 cm/s  SHUNTS MV E/A ratio:  1.21         Systemic VTI:  0.21 m                             Systemic Diam: 2.10 cm Isaias Cowman MD Electronically signed by Isaias Cowman MD Signature Date/Time: 03/06/2022/4:59:05 PM    Final    US Venous Img Lower Bilateral (DVT)  Result Date: 03/06/2022 CLINICAL DATA:  Bilateral lower extremity pain, swelling, and  redness for 2 weeks EXAM: Bilateral lower Extremity Venous Doppler Ultrasound TECHNIQUE: Gray-scale sonography with compression, as well as color and duplex ultrasound, were performed to evaluate the deep venous system(s) from the level of the common femoral vein through the popliteal and proximal calf veins. COMPARISON:  07/20/2021 FINDINGS: VENOUS Normal compressibility of the common femoral, superficial femoral, and popliteal veins, as well as the visualized calf veins. Visualized portions of profunda femoral vein and great saphenous vein unremarkable. No filling defects to suggest DVT on grayscale or color Doppler imaging. Doppler waveforms show normal direction of venous flow, normal respiratory plasticity and response to augmentation. OTHER 6.0 x 0.8 x 3.9 cm fluid collection in the right popliteal fossa is likely a Baker cyst. Limitations: none IMPRESSION: No  lower extremity DVT. Electronically Signed   By: Miachel Roux M.D.   On: 03/06/2022 12:18      Time coordinating discharge: Over 30 minutes  SIGNED:  Emeterio Reeve DO [facility]

## 2022-03-18 NOTE — Progress Notes (Signed)
03/18/2022 at 2340:  Pt called to front desk. Assigned RN alerted and reports to pt bedside. Pt requests that "cream be rubbed on my bottom." NT reports to bedside and helps to clean pt and change pt linen as pt had an incontinent episode. Incontinence cleanser is used and moisture barrier is rubbed on requested areas. Pt skin is intake and free from redness or irritation. Pt educated on purpose on prophylactic sacral Mepilex foam dressing. Lidocaine patch placed on mid back area. Pt repositioned. Pt needs are met and call bell within reach.

## 2022-03-18 NOTE — Hospital Course (Addendum)
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    Patient was admitted to ICU with Acute Hypoxic Hypercapnic Respiratory Failure secondary to CHF decompensation requiring BiPAP, along with severe Sepsis with septic shock  Infectious: treated for sepsis in setting of UTI vs ?cellulitis of bilateral lower extremities vs stasis dermatitis due to peripheral edema.  She has open superficial wound on each lower leg from fluid weeping which formed blisters.  Shock (HCC)-resolved as of 03/10/2022. Off pressors. Unclear if shock was cardiogenic or septic in nature, could not rule out septic shock but clinically undetermined.  Sepsis itself stabilized. Sepsis physiology improved which was de-escalated to Rocephin and completed antibiotic course. Respiratory: Stabilized and patient transferred to hospitalist service on 8/27.  Since then, patient has been receiving diuresis. Patient's oxygen requirement slightly increased, 8/29, patient noted to have low-grade fever.  Had episode of brief tachycardia and ABG done noted worsening hypercarbia and hypoxia.  Patient placed on BiPAP. Able to be weaned off of BiPAP during the day by 8/30.  PCO2 notes improvement and patient breathing more comfortably.  Patient on 4 L which she needs with ambulation.  This may be her new baseline.  Cleared by pulmonary. CHF: Lasix increased to 40 twice daily on 8/29.  Also with Aldactone.  She has since diuresed almost 12 L and is more than -8.5 L deficient.   Home torsemide on hold.  Given morbid obesity, BNP is likely significantly likely underestimating degree of heart failure. Noted creatinine slightly increased on 9/5 and weight slightly went up.  May be reaching end of diuresis compared to previous weight, likely not yet fully diuresed.  We will continue Lasix.  If patient excepted to skilled nursing sooner  than completion of diuresis, may end up discharging on increased dose of Lasix for several days, before decreasing back to home dose. Net IO Since Admission: -12,402.29 mL [03/18/22 0929] Renal: AKI improved OSA: Uses trilogy/NIV at home.  We will make sure in her discharge instructions that she will continue to use her home trilogy machine at skilled nursing. While here in the hospital, BiPAP at night  Consultants:  Critical Care / Pulmonary   Procedures: Echocardiogram done 8/25: Preserved diastolic function.  Ejection fraction 45% ABIs unremarkable   Antimicrobials: IV Rocephin 8/27-8/30 IV cefepime and vancomycin 8/25 - 8/26     ASSESSMENT & PLAN:   Principal Problem:   Acute on chronic systolic CHF (congestive heart failure) (HCC) Active Problems:   Acute on chronic respiratory failure with hypoxia and hypercapnia (HCC)   Lymphedema of both lower extremities   PAF (paroxysmal atrial fibrillation) (HCC)   OSA (obstructive sleep apnea)   Elevated troponin   Type 2 diabetes mellitus with diabetic neuropathy, without long-term current use of insulin (HCC)   Hypothyroidism   Benign essential hypertension   HLD (hyperlipidemia)   Morbid obesity with BMI of 50.0-59.9, adult ([place])   Acute on chronic systolic CHF (congestive heart failure) ([place]) If patient accepted to SNF sooner than completion of diuresis, may end up discharging on increased dose of Lasix for several days, before decreasing back to home dose  Acute on chronic respiratory failure with hypoxia and hypercapnia (HCC) At baseline, patient uses trilogy NIV at home.   Supplemental O2 to maintain sats 88 to 93%.   Able to be weaned off of BiPAP during the  day by 8/30.  Patient on 4 L which she needs with ambulation.  This may be her new baseline.  Cleared by pulmonary.  Shock ([place]) Septic versus cardiogenic shock -resolved. Briefly required pressors on admission to ICU. Home Coreg, torsemide and ARB are on hold,  resume when BP will tolerate  Acute metabolic encephalopathy - resolved POA, likely due to hypoxia and infection.    Benign essential hypertension Presented in shock requiring vasopressors.   BP has stabilized but home antihypertensives are still on hold.   Resume when BP will tolerate.  Elevated troponin Mild and flat trend.  Likely demand ischemia in the setting of decompensated CHF and shock.    HLD (hyperlipidemia) Continue statin  PAF (paroxysmal atrial fibrillation) (HCC) Continue Eliquis.   Coreg on hold until BP improves.   Monitor on telemetry.  Hypothyroidism Continue Synthroid  Lymphedema of both lower extremities De-escalated antibiotics to Rocephin.   Seen by wound care and checking ABIs. Continue diuresis  Morbid obesity with BMI of 50.0-59.9, adult (HCC) Body mass index is 58.31 kg/m. Complicates overall care and prognosis.  OSA (obstructive sleep apnea) Uses trilogy/NIV at home.  We will make sure in her discharge instructions that she will continue to use her home trilogy machine at skilled nursing. While here in the hospital, BiPAP at night  Acute kidney injury in the setting of stage 3a chronic kidney disease ([place]) AKI superimposed on CKD IIIa Presented with creatinine 1.97 in the setting of decompensated CHF and volume overload, probable infection, likely prerenal AKI.   Currently better than baseline, indicating still room to diurese.   Continue to monitor labs.  Type 2 diabetes mellitus with diabetic neuropathy, without long-term current use of insulin (HCC) Continue sliding scale NovoLog  Severe sepsis ([place]) - resolved Patient met SIRS criteria on admission with leukocytosis and tachypnea in setting of suspected UTI versus cellulitis.  Unclear if shock is cardiogenic or septic in nature, cannot rule out septic shock but clinically undetermined.  Sepsis itself stabilized. Follow VS and labs  Hypokalemia Monitor BMP, replace K as  needed.    DVT prophylaxis: Eliquis Pertinent IV fluids/nutrition: n/a Central lines / invasive devices: n/a  Code Status: FULL Family Communication: ***  Disposition: inpatient for now, SNF planned [facility] needs: SNF placement  Barriers to discharge / significant pending items: SNF placement, continued diuresis

## 2022-03-18 NOTE — Progress Notes (Addendum)
Mobility Specialist - Progress Note   03/18/22 1027  Mobility  Activity Transferred to/from [facility] (tranferred from bcs to bed)  Level of Assistance +2 (takes two people)  Games developer wheel walker  Distance Ambulated (ft) 4 ft  Activity Response Tolerated well  $Mobility charge 1 Mobility   [place] Mobility Specialist 03/18/22 10:31 AM

## 2022-03-18 NOTE — Progress Notes (Signed)
Occupational Therapy Treatment Patient Details Name: Madison Mcbride MRN: [id number] DOB: 19-Aug-1955 Today's Date: 03/18/2022   History of present illness Pt is a 66 yo female that presented to the ED for SOB, weakness. Admitted to ICU for acaute on chronic hypoxic hypercapnic respiratory failure, sepsis due to wound infection.  PMH of COPD, CHF, DM, lymphedema, thyroid cancer, sleep apnea, afib, RLS.   OT comments  Upon entering the room, Pt lying in bed and agreeable to OT treatment. Patient refused any functional mobility stating "I feel as if I've done enough today" With encouragement therapist was able to get patient to agree to completing UB strengthening using the red theraband. Pt was able to perform 3 reps of 10 for each exercise to assist in (shoulder flexion, ab/adduction, elbow flexion.)  Return demonstration of exercises and patient able to verbalize understanding of importance of increasing UB strength/endurance during this time. Pt with c/o back pain and right shoulder pain, very limited ROM, which limited endurance/participation in the exercise program. Pt bed repositioned to help to assist in comforting pt's back. Pt left in bed with call bell in reach, bed alarm set, and all needs met.    Recommendations for follow up therapy are one component of a multi-disciplinary discharge planning process, led by the attending physician.  Recommendations may be updated based on patient status, additional functional criteria and insurance authorization.    Follow Up Recommendations  Skilled nursing-short term rehab (<3 hours/day)    Assistance Recommended at Discharge Intermittent Supervision/Assistance  Patient can return home with the following  A lot of help with walking and/or transfers;A lot of help with bathing/dressing/bathroom   Equipment Recommendations       Recommendations for Other Services      Precautions / Restrictions Precautions Precautions:  Fall Restrictions Weight Bearing Restrictions: No       Mobility Bed Mobility Overal bed mobility: Needs Assistance                  Transfers                         Balance Overall balance assessment: Needs assistance                                         ADL either performed or assessed with clinical judgement   ADL Overall ADL's : Needs assistance/impaired                                            Extremity/Trunk Assessment Upper Extremity Assessment Upper Extremity Assessment: Generalized weakness   Lower Extremity Assessment Lower Extremity Assessment: Generalized weakness        Vision Baseline Vision/History: 1 Wears glasses Patient Visual Report: No change from baseline            Cognition Arousal/Alertness: Awake/alert Behavior During Therapy: WFL for tasks assessed/performed Overall Cognitive Status: Within Functional Limits for tasks assessed                                                     Pertinent Vitals/ Pain  Pain Assessment Pain Score: 5  Pain Location: Back Pain Descriptors / Indicators: Discomfort Pain Intervention(s): Limited activity within patient's tolerance, Repositioned, Monitored during session   Frequency  Min 2X/week        Progress Toward Goals  OT Goals(current goals can now be found in the care plan section)  Progress towards OT goals: Progressing toward goals  Acute Rehab OT Goals Patient Stated Goal: To return home. OT Goal Formulation: With patient Time For Goal Achievement: 03/21/22 Potential to Achieve Goals: Good  Plan Discharge plan needs to be updated       AM-PAC OT "6 Clicks" Daily Activity     Outcome Measure   Help from another person eating meals?: A Little Help from another person taking care of personal grooming?: A Little Help from another person toileting, which includes using toliet, bedpan, or urinal?: A  Lot Help from another person bathing (including washing, rinsing, drying)?: A Lot Help from another person to put on and taking off regular upper body clothing?: A Lot Help from another person to put on and taking off regular lower body clothing?: A Lot 6 Click Score: 14    End of Session Equipment Utilized During Treatment: Oxygen  OT Visit Diagnosis: Muscle weakness (generalized) (M62.81);Other abnormalities of gait and mobility (R26.89)   Activity Tolerance Patient tolerated treatment well   Patient Left in bed;with call bell/phone within reach;with bed alarm set           Time: 7184-1085 OT Time Calculation (min): 19 min  Charges:      Tomasa Blase, OTS 03/18/2022, 11:46 AM

## 2022-03-18 NOTE — Progress Notes (Signed)
Discharge had to be canceled facility unable to accept trilogy device and unable to arrange BiPAP until tomorrow.

## 2022-03-18 NOTE — Plan of Care (Signed)

## 2022-03-18 NOTE — TOC Progression Note (Signed)
Transition of Care [facility]) - Progression Note    Patient Details  Name: Madison Mcbride MRN: [id number] Date of Birth: March 04, 1956  [facility] of [facility]) CM/SW Contact  Laurena Slimmer, RN Phone Number: 03/18/2022, 3:03 PM  Clinical Narrative:    Spoke with Tammy in admissions at [facility]. Facility was going to initially accept patient. Patient has home Trilogy. Patient does not accept Trilogy. BIPAP will be ordered. Tammy confirmed current settings were obtained by facility, and would be there at the [facility] by tomorrow. MD and nurse notified.    Expected Discharge Plan: [place] Barriers to Discharge: Continued Medical Work up  Expected Discharge Plan and Services Expected Discharge Plan: [place]   Discharge Planning Services: CM Consult Post Acute Care Choice: [place] arrangements for the past 2 months: Single Family Home Expected Discharge Date: 03/18/22               DME Arranged: N/A DME Agency: NA       HH Arranged: RN, PT, OT HH Agency: [place] Date HH Agency Contacted: 03/07/22 Time [place]: 1624     Social Determinants of Health (SDOH) Interventions    Readmission Risk Interventions    03/07/2022    4:12 PM  Readmission Risk Prevention Plan  Transportation Screening Complete  PCP or Specialist Appt within 5-7 Days Complete  Home Care Screening Complete  Medication Review (RN CM) Complete

## 2022-03-19 ENCOUNTER — Other Ambulatory Visit (HOSPITAL_BASED_OUTPATIENT_CLINIC_OR_DEPARTMENT_OTHER): Payer: Self-pay | Admitting: Osteopathic Medicine

## 2022-03-19 DIAGNOSIS — I5023 Acute on chronic systolic (congestive) heart failure: Secondary | ICD-10-CM | POA: Diagnosis not present

## 2022-03-19 MED ORDER — OXYCODONE-ACETAMINOPHEN 7.5-325 MG PO TABS: 1.0000 | ORAL_TABLET | Freq: Four times a day (QID) | ORAL | 0 refills | Status: AC | PRN

## 2022-03-19 NOTE — Discharge Summary (Signed)
Physician Discharge Summary   Patient: Madison Mcbride MRN: [id number]  DOB: 05/13/1956   Admit:     Date of Admission: 03/05/2022 Admitted from: home   Discharge: Date of discharge: 03/19/22 (was ready for d/c yesterday but had to be held so facility could arrange BiPap)  Disposition: Skilled nursing facility Condition at discharge: good  CODE STATUS: FULL     Discharge Physician: Emeterio Reeve, DO [facility]     PCP: Rusty Aus, MD  Recommendations for Outpatient Follow-up:  Follow up with PCP Rusty Aus, MD in 2-4 weeks Please obtain labs/tests: BMP in 2-5 days at SNF Please follow up on the following pending results: none PCP AND OTHER OUTPATIENT PROVIDERS: SEE [place] AVS PATIENT INFO    Discharge Instructions     Diet - low sodium heart healthy   Complete by: As directed    Discharge instructions   Complete by: As directed    Check BMP in 2-5 days to monitor on Lasix   Discharge wound care:   Complete by: As directed    Cleanse bilateral LE wounds with saline, pat dry Cover bilateral LE wounds with cut to fit piece of xeroform gauze, top with foam Wrap with Kerlix from toes to knees and secure with 4" ACE wraps from toes to knees Elevate legs as much as possible.  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    Patient was admitted to ICU with Acute Hypoxic Hypercapnic Respiratory Failure secondary to  CHF decompensation requiring BiPAP, along with severe Sepsis with septic shock  Infectious: treated for sepsis in setting of UTI vs ?cellulitis of bilateral lower extremities vs stasis dermatitis due to peripheral edema.  She has open superficial wound on each lower leg from fluid weeping which formed blisters.  Shock (HCC)-resolved as of 03/10/2022. Off pressors. Unclear if shock was cardiogenic or septic in nature, could not rule out septic shock but clinically undetermined.  Sepsis itself stabilized. Sepsis physiology improved which was de-escalated to Rocephin and completed antibiotic course. Respiratory: Stabilized and patient transferred to hospitalist service on 8/27.  Since then, patient has been receiving diuresis. Patient's oxygen requirement slightly increased, 8/29, patient noted to have low-grade fever.  Had episode of brief tachycardia and ABG done noted worsening hypercarbia and hypoxia.  Patient placed on BiPAP. Able to be weaned off of BiPAP during the day by 8/30.  PCO2 notes improvement and patient breathing more comfortably.  Patient on 4 L which she needs with ambulation.  This may be her new baseline.  Cleared by pulmonary. CHF: Lasix increased to 40 twice daily on 8/29.  Also with Aldactone.  She has since diuresed almost 12 L and is more than -8.5 L deficient.   Home torsemide on hold.  Given morbid obesity, BNP is likely significantly likely underestimating degree of heart  failure. Noted creatinine slightly increased on 9/5 and weight slightly went up.  May be reaching end of diuresis compared to previous weight, likely not yet fully diuresed.  We will continue Lasix.  If patient excepted to skilled nursing sooner than completion of diuresis, may end up discharging on increased dose of Lasix for several days, before decreasing back to home dose. Net IO Since Admission: -12,402.29 mL [03/18/22 0929] Renal: AKI improved OSA: Uses trilogy/NIV at home.  We will make sure in her discharge  instructions that she will continue to use her home trilogy machine at skilled nursing. While here in the hospital, BiPAP at night  Consultants:  Critical Care / Pulmonary   Procedures: Echocardiogram done 8/25: Preserved diastolic function.  Ejection fraction 45% ABIs unremarkable   Antimicrobials: IV Rocephin 8/27-8/30 IV cefepime and vancomycin 8/25 - 8/26     ASSESSMENT & PLAN:      Discharge Diagnoses: Principal Problem:   Acute on chronic systolic CHF (congestive heart failure) (HCC) Active Problems:   Acute on chronic respiratory failure with hypoxia and hypercapnia (HCC)   Lymphedema of both lower extremities   PAF (paroxysmal atrial fibrillation) (HCC)   OSA (obstructive sleep apnea)   Elevated troponin   Type 2 diabetes mellitus with diabetic neuropathy, without long-term current use of insulin (HCC)   Hypothyroidism   Benign essential hypertension   HLD (hyperlipidemia)   Morbid obesity with BMI of 50.0-59.9, adult (HCC)     Acute on chronic systolic CHF (congestive heart failure) (HCC) Improved See Lasix instructions   Acute on chronic respiratory failure with hypoxia and hypercapnia (HCC) At baseline, patient uses trilogy NIV at home.   Supplemental O2 to maintain sats 88 to 93%.   Able to be weaned off of BiPAP during the day by 8/30.  Patient on 4 L which she needs with ambulation.  This may be her new baseline.  Cleared by pulmonary.  Shock ([place]) Septic versus cardiogenic shock -resolved. Briefly required pressors on admission to ICU. Home Coreg, torsemide and ARB are on hold, resume when BP will tolerate  Acute metabolic encephalopathy - resolved POA, likely due to hypoxia and infection.    Benign essential hypertension Presented in shock requiring vasopressors.   BP has stabilized but home antihypertensives are still on hold.   Resume when BP will tolerate.  Elevated troponin Mild and flat trend.  Likely demand ischemia in the setting  of decompensated CHF and shock.    HLD (hyperlipidemia) Continue statin  PAF (paroxysmal atrial fibrillation) (HCC) Continue Eliquis.   Coreg on hold until BP improves.   Monitor on telemetry.  Hypothyroidism Continue Synthroid  Lymphedema of both lower extremities De-escalated antibiotics to Rocephin.  Completed course  Seen by wound care and checking ABIs. Continue diuresis  Morbid obesity with BMI of 50.0-59.9, adult (HCC) Body mass index is 58.31 kg/m. Complicates overall care and prognosis.  OSA (obstructive sleep apnea) Uses trilogy/NIV at home.  We will make sure in her discharge instructions that she will continue to use her home trilogy machine at skilled nursing. While here in the hospital, BiPAP at night  Acute kidney injury in the setting of stage 3a chronic kidney disease ([place]) AKI superimposed on CKD IIIa Presented with creatinine 1.97 in the setting of decompensated CHF and volume overload, probable infection, likely prerenal AKI.   Currently better than baseline.   Continue to monitor labs.  Type 2 diabetes mellitus with diabetic neuropathy, without long-term current use of insulin (HCC) Continue  sliding scale NovoLog  Severe sepsis ([place]) - resolved Patient met SIRS criteria on admission with leukocytosis and tachypnea in setting of suspected UTI versus cellulitis.  Unclear if shock is cardiogenic or septic in nature, cannot rule out septic shock but clinically undetermined.  Sepsis itself stabilized. Follow VS and labs  Hypokalemia Monitor BMP, replace K as needed.    DVT prophylaxis: Eliquis Pertinent IV fluids/nutrition: n/a Central lines / invasive devices: n/a  Code Status: FULL Family Communication: none at this time, pt declined call to support person(s)   Disposition: inpatient for now, SNF planned [facility] needs: SNF placement  Barriers to discharge / significant pending items: SNF placement, continued diuresis                 Discharge Instructions  Allergies as of 03/19/2022   No Known Allergies      Medication List     STOP taking these medications    carvedilol 3.125 MG tablet Commonly known as: COREG   torsemide 20 MG tablet Commonly known as: DEMADEX       TAKE these medications    acetaminophen 325 MG tablet Commonly known as: TYLENOL Take 2 tablets (650 mg total) by mouth every 6 (six) hours as needed for mild pain, fever or headache.   ALPRAZolam 0.25 MG tablet Commonly known as: XANAX Take 1 tablet (0.25 mg total) by mouth 3 (three) times daily as needed for anxiety.   apixaban 5 MG Tabs tablet Commonly known as: ELIQUIS Take 1 tablet (5 mg total) by mouth 2 (two) times daily.   atorvastatin 20 MG tablet Commonly known as: LIPITOR Take 20 mg by mouth daily.   Biotin 2.5 MG Caps Take 2.5 mg by mouth daily.   buPROPion 150 MG 24 hr tablet Commonly known as: WELLBUTRIN XL Take 150 mg by mouth daily.   Catheter Self-Adhesive Urinary Misc Continue PurWIck prn if available   celecoxib 200 MG capsule Commonly known as: CELEBREX Take 200 mg by mouth 2 (two) times daily.   Cholecalciferol 25 MCG (1000 UT) tablet Take 1 tablet by mouth daily.   docusate sodium 100 MG capsule Commonly known as: COLACE Take 1 capsule (100 mg total) by mouth 2 (two) times daily as needed for mild constipation.   furosemide 40 MG tablet Commonly known as: Lasix Take 1 tablet (40 mg total) by mouth 2 (two) times daily. Increase to 1 tablet (40 mg total) by mouth THREE TIMES daily (total daily dose 120 mg) as needed for up to 3 days for increased leg swelling, shortness of breath, weight gain 5+ lbs over 1-2 days. Seek medical care if these symptoms are not improving with increased dose. Reduce dose to 1 tablet (40 mg total) by mouth ONCE DAILY if dizziness or low blood pressure on bid dose.   gabapentin 100 MG capsule Commonly known as: NEURONTIN Take 2 capsules (200  mg total) by mouth 2 (two) times daily. What changed: You were already taking a medication with the same name, and this prescription was added. Make sure you understand how and when to take each.   gabapentin 300 MG capsule Commonly known as: NEURONTIN Take 1 capsule (300 mg total) by mouth at bedtime. What changed: when to take this   levothyroxine 125 MCG tablet Commonly known as: SYNTHROID Take 1 tablet (125 mcg total) by mouth daily.   lidocaine 5 % Commonly known as: LIDODERM Place 1 patch onto the skin daily. Remove & Discard patch within 12 hours or  as directed by MD   mouth rinse Liqd solution 15 mLs by Mouth Rinse route as needed (oral care).   multivitamin tablet Take 1 tablet by mouth daily.   oxyCODONE-acetaminophen 7.5-325 MG tablet Commonly known as: PERCOCET Take 1 tablet by mouth every 4 (four) hours as needed for up to 5 days for moderate pain or severe pain.   oxyCODONE-acetaminophen 7.5-325 MG tablet Commonly known as: Percocet Take 1 tablet by mouth every 6 (six) hours as needed for up to 5 days.   pantoprazole 40 MG tablet Commonly known as: PROTONIX Take 40 mg by mouth daily.   polyethylene glycol 17 g packet Commonly known as: MIRALAX / GLYCOLAX Take 17 g by mouth daily as needed for moderate constipation.   potassium chloride SA 20 MEQ tablet Commonly known as: KLOR-CON M Take 20 mEq by mouth daily.   rOPINIRole 3 MG tablet Commonly known as: REQUIP Take 1 tablet (3 mg total) by mouth at bedtime. What changed: You were already taking a medication with the same name, and this prescription was added. Make sure you understand how and when to take each.   rOPINIRole 2 MG tablet Commonly known as: REQUIP Take 1 tablet (2 mg total) by mouth 2 (two) times daily. What changed:  medication strength how much to take when to take this   Semaglutide (1 MG/DOSE) 2 MG/1.5ML Sopn Inject 0.75 mLs into the skin once a week.   spironolactone 25 MG  tablet Commonly known as: ALDACTONE Take 1 tablet (25 mg total) by mouth daily.   zinc gluconate 50 MG tablet Take 50 mg by mouth daily.               Durable Medical Equipment  (From admission, onward)           Start     Ordered   03/18/22 0000  For home use only DME oxygen       Comments: Patient can use her home Trilogy device as directed qhs at home settings  Question Answer Comment  Length of Need Lifetime   Oxygen delivery system Gas      03/18/22 1154              Discharge Care Instructions  (From admission, onward)           Start     Ordered   03/18/22 0000  Discharge wound care:       Comments: Cleanse bilateral LE wounds with saline, pat dry Cover bilateral LE wounds with cut to fit piece of xeroform gauze, top with foam Wrap with Kerlix from toes to knees and secure with 4" ACE wraps from toes to knees Elevate legs as much as possible.  Change every other day  03/12/22 8366     03/18/22 1154             Contact information for after-discharge care     Destination     [facility] Preferred SNF .   Service: Skilled Nursing Contact information: [place] [phone]                     No Known Allergies   Subjective: pt feeling well today, no SOB, no chest pain. Feeling weak in general but a bit better from yesterday.    Discharge Exam: BP 114/63 (BP Location: Left Arm)   Pulse 64   Temp 98.8 F (37.1 C)   Resp 16  Ht '5\' 4"'  (1.626 m)   Wt (!) 144 kg   SpO2 93%   BMI 54.49 kg/m  General: Pt is alert, awake, not in acute distress Cardiovascular: RRR, S1/S2 +, no rubs, no gallops Respiratory: CTA bilaterally, no wheezing, no rhonchi, no rales Abdominal: Soft, NT, ND, bowel sounds + Extremities: Trace edema, somewhat limited exam d.t leg wrappings.  no cyanosis     The results of significant diagnostics from this hospitalization (including  imaging, microbiology, ancillary and laboratory) are listed below for reference.     Microbiology: Recent Results (from the past 240 hour(s))  Resp Panel by RT-PCR (Flu A&B, Covid) Anterior Nasal Swab     Status: None   Collection Time: 03/10/22  1:18 PM   Specimen: Anterior Nasal Swab  Result Value Ref Range Status   SARS Coronavirus 2 by RT PCR NEGATIVE NEGATIVE Final    Comment: (NOTE) SARS-CoV-2 target nucleic acids are NOT DETECTED.  The SARS-CoV-2 RNA is generally detectable in upper respiratory specimens during the acute phase of infection. The lowest concentration of SARS-CoV-2 viral copies this assay can detect is 138 copies/mL. A negative result does not preclude SARS-Cov-2 infection and should not be used as the sole basis for treatment or other patient management decisions. A negative result may occur with  improper specimen collection/handling, submission of specimen other than nasopharyngeal swab, presence of viral mutation(s) within the areas targeted by this assay, and inadequate number of viral copies(<138 copies/mL). A negative result must be combined with clinical observations, patient history, and epidemiological information. The expected result is Negative.  Fact Sheet for Patients:  EntrepreneurPulse.com.au  Fact Sheet for Healthcare Providers:  IncredibleEmployment.be  This test is no t yet approved or cleared by the [place] FDA and  has been authorized for detection and/or diagnosis of SARS-CoV-2 by FDA under an Emergency Use Authorization (EUA). This EUA will remain  in effect (meaning this test can be used) for the duration of the COVID-19 declaration under Section 564(b)(1) of the Act, 21 U.S.C.section 360bbb-3(b)(1), unless the authorization is terminated  or revoked sooner.       Influenza A by PCR NEGATIVE NEGATIVE Final   Influenza B by PCR NEGATIVE NEGATIVE Final    Comment: (NOTE) The Xpert Xpress  SARS-CoV-2/FLU/RSV plus assay is intended as an aid in the diagnosis of influenza from Nasopharyngeal swab specimens and should not be used as a sole basis for treatment. Nasal washings and aspirates are unacceptable for Xpert Xpress SARS-CoV-2/FLU/RSV testing.  Fact Sheet for Patients: EntrepreneurPulse.com.au  Fact Sheet for Healthcare Providers: IncredibleEmployment.be  This test is not yet approved or cleared by the [place] FDA and has been authorized for detection and/or diagnosis of SARS-CoV-2 by FDA under an Emergency Use Authorization (EUA). This EUA will remain in effect (meaning this test can be used) for the duration of the COVID-19 declaration under Section 564(b)(1) of the Act, 21 U.S.C. section 360bbb-3(b)(1), unless the authorization is terminated or revoked.  Performed at [facility], [address] [place], [place] [address]      Labs: BNP (last 3 results) Recent Labs    03/05/22 1743 03/10/22 1442 03/14/22 1139  BNP 955.0* 838.6* 248.2*   Basic Metabolic Panel: Recent Labs  Lab 03/14/22 0421 03/15/22 0328 03/16/22 0611 03/17/22 0418 03/18/22 0432  NA 143 142 143 145 147*  K 3.5 3.7 3.6 3.9 3.8  CL 97* 95* 95* 94* 95*  CO2 40* 38* 40* 38* 41*  GLUCOSE 93 102* 114*  137* 115*  BUN '22 21 20 22 ' 24*  CREATININE 0.94 0.92 0.97 1.09* 1.08*  CALCIUM 8.2* 8.2* 8.2* 8.6* 8.5*   Liver Function Tests: No results for input(s): "AST", "ALT", "ALKPHOS", "BILITOT", "PROT", "ALBUMIN" in the last 168 hours. No results for input(s): "LIPASE", "AMYLASE" in the last 168 hours. No results for input(s): "AMMONIA" in the last 168 hours. CBC: Recent Labs  Lab 03/14/22 0421 03/17/22 0418 03/18/22 0432  WBC 12.9* 13.7* 12.3*  HGB 9.4* 10.1* 9.1*  HCT 34.7* 36.7 34.6*  MCV 85.3 85.9 86.7  PLT 401* 365 520*   Cardiac Enzymes: No results for input(s): "CKTOTAL", "CKMB", "CKMBINDEX", "TROPONINI" in the last 168  hours. BNP: Invalid input(s): "POCBNP" CBG: Recent Labs  Lab 03/14/22 0837 03/14/22 1212 03/14/22 1556 03/14/22 2021 03/15/22 0741  GLUCAP 86 115* 133* 125* 112*   D-Dimer No results for input(s): "DDIMER" in the last 72 hours. Hgb A1c No results for input(s): "HGBA1C" in the last 72 hours. Lipid Profile No results for input(s): "CHOL", "HDL", "LDLCALC", "TRIG", "CHOLHDL", "LDLDIRECT" in the last 72 hours. Thyroid function studies No results for input(s): "TSH", "T4TOTAL", "T3FREE", "THYROIDAB" in the last 72 hours.  Invalid input(s): "FREET3" Anemia work up No results for input(s): "VITAMINB12", "FOLATE", "FERRITIN", "TIBC", "IRON", "RETICCTPCT" in the last 72 hours. Urinalysis    Component Value Date/Time   COLORURINE YELLOW (A) 03/06/2022 1638   APPEARANCEUR CLEAR (A) 03/06/2022 1638   APPEARANCEUR CLOUDY 01/15/2014 0209   LABSPEC 1.009 03/06/2022 1638   LABSPEC 1.014 01/15/2014 0209   PHURINE 5.0 03/06/2022 1638   GLUCOSEU NEGATIVE 03/06/2022 1638   GLUCOSEU NEGATIVE 01/15/2014 0209   HGBUR SMALL (A) 03/06/2022 1638   BILIRUBINUR NEGATIVE 03/06/2022 1638   BILIRUBINUR NEGATIVE 01/15/2014 0209   KETONESUR NEGATIVE 03/06/2022 1638   PROTEINUR NEGATIVE 03/06/2022 1638   NITRITE NEGATIVE 03/06/2022 1638   LEUKOCYTESUR MODERATE (A) 03/06/2022 1638   LEUKOCYTESUR 2+ 01/15/2014 0209   Sepsis Labs Recent Labs  Lab 03/14/22 0421 03/17/22 0418 03/18/22 0432  WBC 12.9* 13.7* 12.3*   Microbiology Recent Results (from the past 240 hour(s))  Resp Panel by RT-PCR (Flu A&B, Covid) Anterior Nasal Swab     Status: None   Collection Time: 03/10/22  1:18 PM   Specimen: Anterior Nasal Swab  Result Value Ref Range Status   SARS Coronavirus 2 by RT PCR NEGATIVE NEGATIVE Final    Comment: (NOTE) SARS-CoV-2 target nucleic acids are NOT DETECTED.  The SARS-CoV-2 RNA is generally detectable in upper respiratory specimens during the acute phase of infection. The  lowest concentration of SARS-CoV-2 viral copies this assay can detect is 138 copies/mL. A negative result does not preclude SARS-Cov-2 infection and should not be used as the sole basis for treatment or other patient management decisions. A negative result may occur with  improper specimen collection/handling, submission of specimen other than nasopharyngeal swab, presence of viral mutation(s) within the areas targeted by this assay, and inadequate number of viral copies(<138 copies/mL). A negative result must be combined with clinical observations, patient history, and epidemiological information. The expected result is Negative.  Fact Sheet for Patients:  EntrepreneurPulse.com.au  Fact Sheet for Healthcare Providers:  IncredibleEmployment.be  This test is no t yet approved or cleared by the [place] FDA and  has been authorized for detection and/or diagnosis of SARS-CoV-2 by FDA under an Emergency Use Authorization (EUA). This EUA will remain  in effect (meaning this test can be used) for the duration of the COVID-19 declaration under  Section 564(b)(1) of the Act, 21 U.S.C.section 360bbb-3(b)(1), unless the authorization is terminated  or revoked sooner.       Influenza A by PCR NEGATIVE NEGATIVE Final   Influenza B by PCR NEGATIVE NEGATIVE Final    Comment: (NOTE) The Xpert Xpress SARS-CoV-2/FLU/RSV plus assay is intended as an aid in the diagnosis of influenza from Nasopharyngeal swab specimens and should not be used as a sole basis for treatment. Nasal washings and aspirates are unacceptable for Xpert Xpress SARS-CoV-2/FLU/RSV testing.  Fact Sheet for Patients: EntrepreneurPulse.com.au  Fact Sheet for Healthcare Providers: IncredibleEmployment.be  This test is not yet approved or cleared by the [place] FDA and has been authorized for detection and/or diagnosis of SARS-CoV-2 by FDA under  an Emergency Use Authorization (EUA). This EUA will remain in effect (meaning this test can be used) for the duration of the COVID-19 declaration under Section 564(b)(1) of the Act, 21 U.S.C. section 360bbb-3(b)(1), unless the authorization is terminated or revoked.  Performed at [facility], [address], [place], [place] [address]    Imaging ECHOCARDIOGRAM COMPLETE  Result Date: 03/06/2022    ECHOCARDIOGRAM REPORT   Patient Name:   Madison Mcbride Date of Exam: 03/06/2022 Medical Rec #:  [id number]             Height:       64.0 in Accession #:    [id number]            Weight:       315.9 lb Date of Birth:  22-Jun-1956             BSA:          2.376 m Patient Age:    52 years              BP:           127/67 mmHg Patient Gender: F                     HR:           59 bpm. Exam Location:  [facility] Procedure: 2D Echo, Color Doppler, Cardiac Doppler and Intracardiac            Opacification Agent Indications:     I50.31 congestive heart failure-Acute Diastolic; N19.16                  congestive heart failure-Acute Systolic  History:         Patient has prior history of Echocardiogram examinations, most                  recent 03/16/2021. CHF, Signs/Symptoms:Shortness of Breath, Chest                  Pain and Edema; Risk Factors:Sleep Apnea and Diabetes.  Sonographer:     Charmayne Sheer Referring Phys:  [place] Diagnosing Phys: Isaias Cowman MD  Sonographer Comments: Technically difficult study due to poor echo windows. Image acquisition challenging due to patient body habitus. IMPRESSIONS  1. Left ventricular ejection fraction, by estimation, is 45 to 50%. The left ventricle has mildly decreased function. The left ventricle has no regional wall motion abnormalities. Left ventricular diastolic parameters were normal.  2. Right ventricular systolic function is normal. The right ventricular size is normal.  3. The mitral valve is normal in structure. Mild to moderate  mitral valve regurgitation. No evidence of mitral stenosis.  4. Tricuspid valve regurgitation is mild  to moderate.  5. The aortic valve is normal in structure. Aortic valve regurgitation is moderate. No aortic stenosis is present.  6. The inferior vena cava is normal in size with greater than 50% respiratory variability, suggesting right atrial pressure of 3 mmHg. FINDINGS  Left Ventricle: Left ventricular ejection fraction, by estimation, is 45 to 50%. The left ventricle has mildly decreased function. The left ventricle has no regional wall motion abnormalities. Definity contrast agent was given IV to delineate the left ventricular endocardial borders. The left ventricular internal cavity size was normal in size. There is no left ventricular hypertrophy. Left ventricular diastolic parameters were normal. Right Ventricle: The right ventricular size is normal. No increase in right ventricular wall thickness. Right ventricular systolic function is normal. Left Atrium: Left atrial size was normal in size. Right Atrium: Right atrial size was normal in size. Pericardium: There is no evidence of pericardial effusion. Mitral Valve: The mitral valve is normal in structure. Mild to moderate mitral valve regurgitation. No evidence of mitral valve stenosis. Tricuspid Valve: The tricuspid valve is normal in structure. Tricuspid valve regurgitation is mild to moderate. No evidence of tricuspid stenosis. Aortic Valve: The aortic valve is normal in structure. Aortic valve regurgitation is moderate. Aortic regurgitation PHT measures 624 msec. No aortic stenosis is present. Aortic valve mean gradient measures 8.0 mmHg. Aortic valve peak gradient measures 15.8 mmHg. Aortic valve area, by VTI measures 1.87 cm. Pulmonic Valve: The pulmonic valve was normal in structure. Pulmonic valve regurgitation is not visualized. No evidence of pulmonic stenosis. Aorta: The aortic root is normal in size and structure. Venous: The inferior vena  cava is normal in size with greater than 50% respiratory variability, suggesting right atrial pressure of 3 mmHg. IAS/Shunts: No atrial level shunt detected by color flow Doppler.  LEFT VENTRICLE PLAX 2D LVIDd:         5.48 cm      Diastology LVIDs:         4.53 cm      LV e' medial:    5.66 cm/s LV PW:         1.36 cm      LV E/e' medial:  21.7 LV IVS:        1.15 cm      LV e' lateral:   7.40 cm/s LVOT diam:     2.10 cm      LV E/e' lateral: 16.6 LV SV:         73 LV SV Index:   31 LVOT Area:     3.46 cm  LV Volumes (MOD) LV vol d, MOD A2C: 161.0 ml LV vol d, MOD A4C: 153.0 ml LV vol s, MOD A2C: 73.2 ml LV vol s, MOD A4C: 65.0 ml LV SV MOD A2C:     87.8 ml LV SV MOD A4C:     153.0 ml LV SV MOD BP:      88.1 ml RIGHT VENTRICLE RV Basal diam:  4.02 cm RV Mid diam:    4.67 cm LEFT ATRIUM             Index        RIGHT ATRIUM           Index LA diam:        4.10 cm 1.73 cm/m   RA Area:     15.00 cm LA Vol (A2C):   72.0 ml 30.30 ml/m  RA Volume:   34.10 ml  14.35 ml/m LA Vol (A4C):  48.5 ml 20.41 ml/m LA Biplane Vol: 60.5 ml 25.46 ml/m  AORTIC VALVE                     PULMONIC VALVE AV Area (Vmax):    2.05 cm      PV Vmax:       1.40 m/s AV Area (Vmean):   2.05 cm      PV Peak grad:  7.8 mmHg AV Area (VTI):     1.87 cm AV Vmax:           199.00 cm/s AV Vmean:          131.000 cm/s AV VTI:            0.388 m AV Peak Grad:      15.8 mmHg AV Mean Grad:      8.0 mmHg LVOT Vmax:         118.00 cm/s LVOT Vmean:        77.400 cm/s LVOT VTI:          0.210 m LVOT/AV VTI ratio: 0.54 AI PHT:            624 msec  AORTA Ao Root diam: 4.50 cm MITRAL VALVE                TRICUSPID VALVE MV Area (PHT): 2.86 cm     TR Peak grad:   26.4 mmHg MV Decel Time: 265 msec     TR Vmax:        257.00 cm/s MV E velocity: 123.00 cm/s MV A velocity: 102.00 cm/s  SHUNTS MV E/A ratio:  1.21         Systemic VTI:  0.21 m                             Systemic Diam: 2.10 cm Isaias Cowman MD Electronically signed by Isaias Cowman  MD Signature Date/Time: 03/06/2022/4:59:05 PM    Final    US Venous Img Lower Bilateral (DVT)  Result Date: 03/06/2022 CLINICAL DATA:  Bilateral lower extremity pain, swelling, and redness for 2 weeks EXAM: Bilateral lower Extremity Venous Doppler Ultrasound TECHNIQUE: Gray-scale sonography with compression, as well as color and duplex ultrasound, were performed to evaluate the deep venous system(s) from the level of the common femoral vein through the popliteal and proximal calf veins. COMPARISON:  07/20/2021 FINDINGS: VENOUS Normal compressibility of the common femoral, superficial femoral, and popliteal veins, as well as the visualized calf veins. Visualized portions of profunda femoral vein and great saphenous vein unremarkable. No filling defects to suggest DVT on grayscale or color Doppler imaging. Doppler waveforms show normal direction of venous flow, normal respiratory plasticity and response to augmentation. OTHER 6.0 x 0.8 x 3.9 cm fluid collection in the right popliteal fossa is likely a Baker cyst. Limitations: none IMPRESSION: No  lower extremity DVT. Electronically Signed   By: Miachel Roux M.D.   On: 03/06/2022 12:18      Time coordinating discharge: Over 30 minutes  SIGNED:  Emeterio Reeve DO [facility]

## 2022-03-19 NOTE — TOC Progression Note (Addendum)
Transition of Care [facility]) - Progression Note    Patient Details  Name: Maysa Lynn MRN: [id number] Date of Birth: Dec 07, 1955  [facility]) CM/SW Contact  Laurena Slimmer, RN Phone Number: 03/19/2022, 10:37 AM  Clinical Narrative:    Damaris Schooner with Benita Stabile from [facility]. BIPAP has been ordered. Patient can be accepted to facility today. Patient, nurse and MD notified.   Nurse provided room number and where to call report. Face sheet and medical necessity forms added to EMS packet. EMS scheduled for 11:30am Discharge summary and SNF transfer report sent in [place]. TOC signing off.    Expected Discharge Plan: [place] Barriers to Discharge: Continued Medical Work up  Expected Discharge Plan and Services Expected Discharge Plan: [place]   Discharge Planning Services: CM Consult Post Acute Care Choice: [place] arrangements for the past 2 months: Single Family Home Expected Discharge Date: 03/18/22               DME Arranged: N/A DME Agency: NA       HH Arranged: RN, PT, OT HH Agency: [facility] Date HH Agency Contacted: 03/07/22 Time [place]: 1624     Social Determinants of Health (SDOH) Interventions    Readmission Risk Interventions    03/07/2022    4:12 PM  Readmission Risk Prevention Plan  Transportation Screening Complete  PCP or Specialist Appt within 5-7 Days Complete  Home Care Screening Complete  Medication Review (RN CM) Complete

## 2022-03-19 NOTE — Progress Notes (Signed)
Report called to Andee Poles, RN @ [facility] all questions addressed at time of call.

## 2022-03-23 ENCOUNTER — Ambulatory Visit: Payer: Medicare Other | Admitting: Family

## 2022-03-25 LAB — BLOOD GAS, ARTERIAL
Acid-Base Excess: 16.6 mmol/L — ABNORMAL HIGH (ref 0.0–2.0)
Bicarbonate: 44.5 mmol/L — ABNORMAL HIGH (ref 20.0–28.0)
Delivery systems: POSITIVE
Expiratory PAP: 8 cmH2O
FIO2: 35 %
Inspiratory PAP: 18 cmH2O
O2 Saturation: 97.6 %
Patient temperature: 37
pCO2 arterial: 67 mmHg (ref 32–48)
pH, Arterial: 7.43 (ref 7.35–7.45)
pO2, Arterial: 84 mmHg (ref 83–108)

## 2022-05-20 ENCOUNTER — Other Ambulatory Visit: Payer: Self-pay

## 2022-05-20 ENCOUNTER — Observation Stay: Payer: Medicare Other

## 2022-05-20 ENCOUNTER — Inpatient Hospital Stay
Admission: EM | Admit: 2022-05-20 | Discharge: 2022-05-29 | DRG: 682 | Disposition: A | Discharge status: Skilled Nursing Facility | Payer: Medicare Other | Attending: Internal Medicine | Admitting: Internal Medicine

## 2022-05-20 DIAGNOSIS — E861 Hypovolemia: Secondary | ICD-10-CM | POA: Diagnosis present

## 2022-05-20 DIAGNOSIS — J9611 Chronic respiratory failure with hypoxia: Secondary | ICD-10-CM

## 2022-05-20 DIAGNOSIS — Z85850 Personal history of malignant neoplasm of thyroid: Secondary | ICD-10-CM

## 2022-05-20 DIAGNOSIS — L89322 Pressure ulcer of left buttock, stage 2: Secondary | ICD-10-CM | POA: Diagnosis present

## 2022-05-20 DIAGNOSIS — E1140 Type 2 diabetes mellitus with diabetic neuropathy, unspecified: Secondary | ICD-10-CM | POA: Diagnosis present

## 2022-05-20 DIAGNOSIS — E662 Morbid (severe) obesity with alveolar hypoventilation: Secondary | ICD-10-CM | POA: Diagnosis present

## 2022-05-20 DIAGNOSIS — I5033 Acute on chronic diastolic (congestive) heart failure: Secondary | ICD-10-CM | POA: Diagnosis not present

## 2022-05-20 DIAGNOSIS — B952 Enterococcus as the cause of diseases classified elsewhere: Secondary | ICD-10-CM | POA: Diagnosis present

## 2022-05-20 DIAGNOSIS — I872 Venous insufficiency (chronic) (peripheral): Secondary | ICD-10-CM | POA: Diagnosis present

## 2022-05-20 DIAGNOSIS — D509 Iron deficiency anemia, unspecified: Secondary | ICD-10-CM | POA: Diagnosis present

## 2022-05-20 DIAGNOSIS — J9621 Acute and chronic respiratory failure with hypoxia: Secondary | ICD-10-CM | POA: Diagnosis not present

## 2022-05-20 DIAGNOSIS — I480 Paroxysmal atrial fibrillation: Secondary | ICD-10-CM | POA: Diagnosis present

## 2022-05-20 DIAGNOSIS — E8729 Other acidosis: Secondary | ICD-10-CM | POA: Diagnosis not present

## 2022-05-20 DIAGNOSIS — L89312 Pressure ulcer of right buttock, stage 2: Secondary | ICD-10-CM | POA: Diagnosis present

## 2022-05-20 DIAGNOSIS — R300 Dysuria: Secondary | ICD-10-CM | POA: Diagnosis not present

## 2022-05-20 DIAGNOSIS — W19XXXA Unspecified fall, initial encounter: Secondary | ICD-10-CM | POA: Diagnosis present

## 2022-05-20 DIAGNOSIS — N390 Urinary tract infection, site not specified: Secondary | ICD-10-CM | POA: Diagnosis present

## 2022-05-20 DIAGNOSIS — E890 Postprocedural hypothyroidism: Secondary | ICD-10-CM | POA: Diagnosis present

## 2022-05-20 DIAGNOSIS — Z9981 Dependence on supplemental oxygen: Secondary | ICD-10-CM

## 2022-05-20 DIAGNOSIS — M064 Inflammatory polyarthropathy: Secondary | ICD-10-CM | POA: Diagnosis present

## 2022-05-20 DIAGNOSIS — E1151 Type 2 diabetes mellitus with diabetic peripheral angiopathy without gangrene: Secondary | ICD-10-CM | POA: Diagnosis present

## 2022-05-20 DIAGNOSIS — L89309 Pressure ulcer of unspecified buttock, unspecified stage: Secondary | ICD-10-CM | POA: Diagnosis not present

## 2022-05-20 DIAGNOSIS — I509 Heart failure, unspecified: Secondary | ICD-10-CM

## 2022-05-20 DIAGNOSIS — E1122 Type 2 diabetes mellitus with diabetic chronic kidney disease: Secondary | ICD-10-CM | POA: Diagnosis present

## 2022-05-20 DIAGNOSIS — K649 Unspecified hemorrhoids: Secondary | ICD-10-CM | POA: Diagnosis present

## 2022-05-20 DIAGNOSIS — I878 Other specified disorders of veins: Secondary | ICD-10-CM | POA: Diagnosis present

## 2022-05-20 DIAGNOSIS — N182 Chronic kidney disease, stage 2 (mild): Secondary | ICD-10-CM | POA: Diagnosis present

## 2022-05-20 DIAGNOSIS — Z91199 Patient's noncompliance with other medical treatment and regimen due to unspecified reason: Secondary | ICD-10-CM

## 2022-05-20 DIAGNOSIS — Z7901 Long term (current) use of anticoagulants: Secondary | ICD-10-CM

## 2022-05-20 DIAGNOSIS — N179 Acute kidney failure, unspecified: Principal | ICD-10-CM | POA: Diagnosis present

## 2022-05-20 DIAGNOSIS — M1990 Unspecified osteoarthritis, unspecified site: Secondary | ICD-10-CM

## 2022-05-20 DIAGNOSIS — E1142 Type 2 diabetes mellitus with diabetic polyneuropathy: Secondary | ICD-10-CM | POA: Diagnosis present

## 2022-05-20 DIAGNOSIS — G2581 Restless legs syndrome: Secondary | ICD-10-CM | POA: Diagnosis present

## 2022-05-20 DIAGNOSIS — R001 Bradycardia, unspecified: Secondary | ICD-10-CM | POA: Diagnosis present

## 2022-05-20 DIAGNOSIS — Z6841 Body Mass Index (BMI) 40.0 and over, adult: Secondary | ICD-10-CM

## 2022-05-20 DIAGNOSIS — J9622 Acute and chronic respiratory failure with hypercapnia: Secondary | ICD-10-CM | POA: Diagnosis not present

## 2022-05-20 DIAGNOSIS — G9341 Metabolic encephalopathy: Secondary | ICD-10-CM | POA: Diagnosis not present

## 2022-05-20 DIAGNOSIS — Z79899 Other long term (current) drug therapy: Secondary | ICD-10-CM

## 2022-05-20 DIAGNOSIS — G4733 Obstructive sleep apnea (adult) (pediatric): Secondary | ICD-10-CM | POA: Diagnosis present

## 2022-05-20 LAB — BASIC METABOLIC PANEL
Anion gap: 9 (ref 5–15)
BUN: 50 mg/dL — ABNORMAL HIGH (ref 8–23)
CO2: 27 mmol/L (ref 22–32)
Calcium: 8.4 mg/dL — ABNORMAL LOW (ref 8.9–10.3)
Chloride: 105 mmol/L (ref 98–111)
Creatinine, Ser: 1.89 mg/dL — ABNORMAL HIGH (ref 0.44–1.00)
GFR, Estimated: 29 mL/min — ABNORMAL LOW (ref >60)
Glucose, Bld: 89 mg/dL (ref 70–99)
Potassium: 4.8 mmol/L (ref 3.5–5.1)
Sodium: 141 mmol/L (ref 135–145)

## 2022-05-20 LAB — FERRITIN: Ferritin: 17 ng/mL (ref 11–307)

## 2022-05-20 LAB — URINALYSIS, COMPLETE (UACMP) WITH MICROSCOPIC
Bacteria, UA: NONE SEEN
Bilirubin Urine: NEGATIVE
Glucose, UA: NEGATIVE mg/dL
Hgb urine dipstick: NEGATIVE
Ketones, ur: NEGATIVE mg/dL
Leukocytes,Ua: NEGATIVE
Nitrite: NEGATIVE
Protein, ur: NEGATIVE mg/dL
Specific Gravity, Urine: 1.011 (ref 1.005–1.030)
pH: 5 (ref 5.0–8.0)

## 2022-05-20 LAB — CBC WITH DIFFERENTIAL/PLATELET
Abs Immature Granulocytes: 0.05 10*3/uL (ref 0.00–0.07)
Basophils Absolute: 0.1 10*3/uL (ref 0.0–0.1)
Basophils Relative: 1 %
Eosinophils Absolute: 0.2 10*3/uL (ref 0.0–0.5)
Eosinophils Relative: 2 %
HCT: 32.8 % — ABNORMAL LOW (ref 36.0–46.0)
Hemoglobin: 9 g/dL — ABNORMAL LOW (ref 12.0–15.0)
Immature Granulocytes: 1 %
Lymphocytes Relative: 14 %
Lymphs Abs: 1.4 10*3/uL (ref 0.7–4.0)
MCH: 21.2 pg — ABNORMAL LOW (ref 26.0–34.0)
MCHC: 27.4 g/dL — ABNORMAL LOW (ref 30.0–36.0)
MCV: 77.4 fL — ABNORMAL LOW (ref 80.0–100.0)
Monocytes Absolute: 0.8 10*3/uL (ref 0.1–1.0)
Monocytes Relative: 8 %
Neutro Abs: 7.4 10*3/uL (ref 1.7–7.7)
Neutrophils Relative %: 74 %
Platelets: 385 10*3/uL (ref 150–400)
RBC: 4.24 MIL/uL (ref 3.87–5.11)
RDW: 20.6 % — ABNORMAL HIGH (ref 11.5–15.5)
WBC: 10 10*3/uL (ref 4.0–10.5)
nRBC: 0.5 % — ABNORMAL HIGH (ref 0.0–0.2)

## 2022-05-20 LAB — BLOOD GAS, VENOUS
Acid-Base Excess: 3.7 mmol/L — ABNORMAL HIGH (ref 0.0–2.0)
Bicarbonate: 30.5 mmol/L — ABNORMAL HIGH (ref 20.0–28.0)
O2 Saturation: 83.9 %
Patient temperature: 37
pCO2, Ven: 54 mmHg (ref 44–60)
pH, Ven: 7.36 (ref 7.25–7.43)
pO2, Ven: 56 mmHg — ABNORMAL HIGH (ref 32–45)

## 2022-05-20 LAB — IRON AND TIBC
Iron: 21 ug/dL — ABNORMAL LOW (ref 28–170)
Saturation Ratios: 5 % — ABNORMAL LOW (ref 10.4–31.8)
TIBC: 388 ug/dL (ref 250–450)
UIBC: 367 ug/dL

## 2022-05-20 LAB — PROTEIN / CREATININE RATIO, URINE
Creatinine, Urine: 77 mg/dL
Protein Creatinine Ratio: 0.12 mg/mg{Cre} (ref 0.00–0.15)
Total Protein, Urine: 9 mg/dL

## 2022-05-20 LAB — TSH: TSH: 0.202 u[IU]/mL — ABNORMAL LOW (ref 0.350–4.500)

## 2022-05-20 MED ORDER — ALPRAZOLAM 0.25 MG PO TABS
0.2500 mg | ORAL_TABLET | Freq: Three times a day (TID) | ORAL | Status: DC | PRN
  Administered 2022-05-20 – 2022-05-24 (×2): 0.25 mg via ORAL
  Filled 2022-05-20 (×2): qty 1

## 2022-05-20 MED ORDER — POLYETHYLENE GLYCOL 3350 17 G PO PACK: 17.0000 g | PACK | Freq: Every day | ORAL | Status: DC | PRN

## 2022-05-20 MED ORDER — LEVOTHYROXINE SODIUM 50 MCG PO TABS
125.0000 ug | ORAL_TABLET | Freq: Every day | ORAL | Status: DC
  Administered 2022-05-21: 125 ug via ORAL
  Filled 2022-05-20: qty 1

## 2022-05-20 MED ORDER — ROPINIROLE HCL 1 MG PO TABS
3.0000 mg | ORAL_TABLET | Freq: Every day | ORAL | Status: DC
  Administered 2022-05-20 – 2022-05-28 (×9): 3 mg via ORAL
  Filled 2022-05-20 (×9): qty 3

## 2022-05-20 MED ORDER — POLYSACCHARIDE IRON COMPLEX 150 MG PO CAPS
150.0000 mg | ORAL_CAPSULE | Freq: Every day | ORAL | Status: DC
  Administered 2022-05-20 – 2022-05-22 (×3): 150 mg via ORAL
  Filled 2022-05-20 (×4): qty 1

## 2022-05-20 MED ORDER — GABAPENTIN 100 MG PO CAPS
200.0000 mg | ORAL_CAPSULE | Freq: Three times a day (TID) | ORAL | Status: DC
  Administered 2022-05-20 – 2022-05-29 (×26): 200 mg via ORAL
  Filled 2022-05-20 (×26): qty 2

## 2022-05-20 MED ORDER — APIXABAN 5 MG PO TABS
5.0000 mg | ORAL_TABLET | Freq: Two times a day (BID) | ORAL | Status: DC
  Administered 2022-05-20 – 2022-05-29 (×18): 5 mg via ORAL
  Filled 2022-05-20 (×18): qty 1

## 2022-05-20 MED ORDER — SODIUM CHLORIDE 0.9% FLUSH
3.0000 mL | Freq: Two times a day (BID) | INTRAVENOUS | Status: DC
  Administered 2022-05-20 – 2022-05-29 (×17): 3 mL via INTRAVENOUS

## 2022-05-20 MED ORDER — CARVEDILOL 6.25 MG PO TABS
6.2500 mg | ORAL_TABLET | Freq: Two times a day (BID) | ORAL | Status: DC
  Administered 2022-05-20: 6.25 mg via ORAL
  Filled 2022-05-20: qty 1

## 2022-05-20 MED ORDER — BUPROPION HCL ER (XL) 150 MG PO TB24
150.0000 mg | ORAL_TABLET | Freq: Every day | ORAL | Status: DC
  Administered 2022-05-21 – 2022-05-29 (×9): 150 mg via ORAL
  Filled 2022-05-20 (×9): qty 1

## 2022-05-20 MED ORDER — PANTOPRAZOLE SODIUM 40 MG PO TBEC
40.0000 mg | DELAYED_RELEASE_TABLET | Freq: Every day | ORAL | Status: DC
  Administered 2022-05-21 – 2022-05-29 (×9): 40 mg via ORAL
  Filled 2022-05-20 (×9): qty 1

## 2022-05-20 MED ORDER — ENOXAPARIN SODIUM 30 MG/0.3ML IJ SOSY: 30.0000 mg | PREFILLED_SYRINGE | INTRAMUSCULAR | Status: DC

## 2022-05-20 MED ORDER — ADULT MULTIVITAMIN W/MINERALS CH
1.0000 | ORAL_TABLET | Freq: Every day | ORAL | Status: DC
  Administered 2022-05-20 – 2022-05-29 (×10): 1 via ORAL
  Filled 2022-05-20 (×10): qty 1

## 2022-05-20 MED ORDER — ACETAMINOPHEN 650 MG RE SUPP: 650.0000 mg | Freq: Four times a day (QID) | RECTAL | Status: DC | PRN

## 2022-05-20 MED ORDER — INSULIN ASPART 100 UNIT/ML IJ SOLN
0.0000 [IU] | Freq: Three times a day (TID) | INTRAMUSCULAR | Status: DC
  Filled 2022-05-20: qty 1

## 2022-05-20 MED ORDER — ATORVASTATIN CALCIUM 20 MG PO TABS
20.0000 mg | ORAL_TABLET | Freq: Every day | ORAL | Status: DC
  Administered 2022-05-21 – 2022-05-29 (×9): 20 mg via ORAL
  Filled 2022-05-20 (×9): qty 1

## 2022-05-20 MED ORDER — PROPRANOLOL HCL 20 MG PO TABS: 20.0000 mg | ORAL_TABLET | Freq: Three times a day (TID) | ORAL | Status: DC

## 2022-05-20 MED ORDER — LACTATED RINGERS IV SOLN: INTRAVENOUS | Status: DC

## 2022-05-20 MED ORDER — ACETAMINOPHEN 325 MG PO TABS
650.0000 mg | ORAL_TABLET | Freq: Four times a day (QID) | ORAL | Status: DC | PRN
  Administered 2022-05-20 – 2022-05-28 (×6): 650 mg via ORAL
  Filled 2022-05-20 (×6): qty 2

## 2022-05-20 NOTE — Assessment & Plan Note (Signed)
Patient presenting with approximately 31-monthhistory of gradually worsening microcytic anemia of which she was unaware of.  She is experiencing fatigue and ice craving, which she was unaware could be related to her anemia.  She denies any menstrual bleeding or melena/hematochezia.  Last colonoscopy in 2017.  - Daily CBC - Ferritin and iron panel pending - Transfuse for hemoglobin less than 7 - Start oral iron supplementation

## 2022-05-20 NOTE — Assessment & Plan Note (Signed)
Per chart review, patient followed up with her PCP for concern of inflammatory arthritis.  Anti-CCP markedly elevated.  She was referred to rheumatology but has not followed up.  Given patient has had increasing falls, this would be prudent to address.  - Outpatient rheumatology follow-up

## 2022-05-20 NOTE — Assessment & Plan Note (Signed)
   BiPAP at night 

## 2022-05-20 NOTE — Assessment & Plan Note (Signed)
Lower extremity edema that is nonpitting in nature with hyperpigmentation consistent with chronic venous stasis.  Patient states symptoms are improving.  No evidence of acute infection.  Patient would like to hold off on Unna boots at this time.

## 2022-05-20 NOTE — ED Triage Notes (Signed)
Pt called EMS for new increased weakness. Pt states she has had increase in falls which is new. Pt states she has two new lumps, one on top of her bottom and one lower.

## 2022-05-20 NOTE — ED Notes (Signed)
Hospitalist @ the bedside

## 2022-05-20 NOTE — Assessment & Plan Note (Signed)
Patient was instructed on recent hospital discharge to wear 4 L of oxygen via nasal cannula when ambulating.  She has the oxygen available at home, but only wears it intermittently.  She does not check her oxygen levels at home.  - Obtain ambulatory oxygen saturation - Continue supplemental oxygen as needed to maintain O2 saturation between 88 and 95% - Avoid over oxygenating due to history of hypercapnia.

## 2022-05-20 NOTE — Assessment & Plan Note (Signed)
Patient endorsing dysuria with recent history of Klebsiella pneumoniae UTI treated with Bactrim.  Given persistent symptoms, will check urine culture  - Urinalysis pending - Urine culture pending

## 2022-05-20 NOTE — ED Provider Notes (Signed)
[facility] Provider Note    Event Date/Time   First MD Initiated Contact with Patient 05/20/22 1515     (approximate)   History   Weakness   HPI  Madison Mcbride is a 66 y.o. female reports she came in with her friends and she found some lumps on her bottom.  SHe is worried they might be bedsores.      Physical Exam   Triage Vital Signs: ED Triage Vitals  Enc Vitals Group     BP 05/20/22 1519 (!) 112/51     Pulse Rate 05/20/22 1519 (!) 51     Resp 05/20/22 1519 (!) 24     Temp 05/20/22 1517 97.7 F (36.5 C)     Temp Source 05/20/22 1517 Oral     SpO2 05/20/22 1519 94 %     Weight --      Height --      Head Circumference --      [facility] --      Pain Score 05/20/22 1520 0     Pain Loc --      Pain Edu? --      Excl. in [place]? --     Most recent vital signs: Vitals:   05/20/22 1517 05/20/22 1519  BP:  (!) 112/51  Pulse:  (!) 51  Resp:  (!) 24  Temp: 97.7 F (36.5 C)   SpO2:  94%     General: Awake, no distress.  CV:  Good peripheral perfusion.  Resp:  Normal effort.  Abd:  No distention.  Buttocks showed two quarter sized bruised areas that are at least stage I decubiti.  Additionally on the left buttocks about 3 cm from the anus there are 3 less than 1 cm early stage II bedsores.   ED Results / Procedures / Treatments   Labs (all labs ordered are listed, but only abnormal results are displayed) Labs Reviewed  BASIC METABOLIC PANEL - Abnormal; Notable for the following components:      Result Value   BUN 50 (*)    Creatinine, Ser 1.89 (*)    Calcium 8.4 (*)    GFR, Estimated 29 (*)    All other components within normal limits  CBC WITH DIFFERENTIAL/PLATELET - Abnormal; Notable for the following components:   Hemoglobin 9.0 (*)    HCT 32.8 (*)    MCV 77.4 (*)    MCH 21.2 (*)    MCHC 27.4 (*)    RDW 20.6 (*)    nRBC 0.5 (*)    All other components within normal limits      EKG     RADIOLOGY   PROCEDURES:  Critical Care performed:   Procedures patient's buttocks were cleaned.  Cipro was changed.  Mepilex was placed in the stage I bedsores and other padding Tegaderm top was placed on the stage II bedsores.  I have put in a face-to-face requesting home health aide nurse to evaluate bedsores and PT.  Also contacted her primary care doctor Dr. Sabra Heck.  MEDICATIONS ORDERED IN ED: Medications - No data to display   IMPRESSION / MDM / [place] / ED COURSE  I reviewed the triage vital signs and the nursing notes. I discussed with the patient and her from the need to try and keep her off her blood change position every 2 hours including getting up and taking a few steps around her recliner.  She will 1 side or the other.  If she cannot do this at home come back here.  Both understand this.   Patient's presentation is most consistent with acute presentation with potential threat to life or bodily function.     FINAL CLINICAL IMPRESSION(S) / ED DIAGNOSES   Final diagnoses:  Pressure injury of skin of buttock, unspecified injury stage, unspecified laterality  AKI (acute kidney injury) ([place])     Rx / DC Orders   ED Discharge Orders     None        Note:  This document was prepared using Dragon voice recognition software and may include unintentional dictation errors.   Nena Polio, MD 05/20/22 [phone]

## 2022-05-20 NOTE — Assessment & Plan Note (Signed)
-   Wound care consult - Encouraged to frequently rotate position at home when on her recliner

## 2022-05-20 NOTE — Assessment & Plan Note (Signed)
History of CHF with EF of 45-50% on carvedilol and Lasix.  She is hypovolemic at this time.  - Holding home Lasix - Restart home carvedilol

## 2022-05-20 NOTE — TOC Initial Note (Signed)
Transition of Care [facility]) - Initial/Assessment Note    Patient Details  Name: Madison Mcbride MRN: [id number] Date of Birth: [birth year]  [facility] CM/SW Contact:    Shelbie Hutching, RN Phone Number: 05/20/2022, 4:19 PM  Clinical Narrative:                 Patient brought into the emergency room by EMS from home for increased weakness and shortness of breath and 2 new lumps on her buttocks. Patient will likely discharge back home, she lives with her husband, she has oxygen and trilogy machine at home that she uses.  Patient is open with [facility] for home health services.  RNCM called [place] with [facility] and let her know patient was being seen in ED.  Home Health services to resume.  Expected Discharge Plan: [place] Barriers to Discharge: Barriers Resolved   Patient Goals and CMS Choice Patient states their goals for this hospitalization and ongoing recovery are:: agrees with returning home with home health CMS Medicare.gov Compare Post Acute Care list provided to:: Patient Choice offered to / list presented to : Patient  Expected Discharge Plan and Services Expected Discharge Plan: [place]   Discharge Planning Services: CM Consult Post Acute Care Choice: [place] arrangements for the past 2 months: Single Family Home                 DME Arranged: N/A DME Agency: NA       HH Arranged: RN, PT, OT, Speech Therapy HH Agency: [place] Date [place]: 05/20/22 Time [place]: 66 Representative spoke with at [place]: [place]  Prior Living Arrangements/Services Living arrangements for the past 2 months: [place] with:: Spouse Patient language and need for interpreter reviewed:: Yes Do you feel safe going back to the place where you live?: Yes      Need for Family Participation in Patient Care: Yes (Comment) Care giver support system in place?: Yes  (comment) Current home services: DME (walker, wheelchair, oxygen, trilogy) Criminal Activity/Legal Involvement Pertinent to Current Situation/Hospitalization: No - Comment as needed  Activities of Daily Living      Permission Sought/Granted                  Emotional Assessment Appearance:: Appears stated age Attitude/Demeanor/Rapport: Engaged Affect (typically observed): Accepting Orientation: : Oriented to Self, Oriented to Place, Oriented to  Time, Oriented to Situation Alcohol / Substance Use: Not Applicable Psych Involvement: No (comment)  Admission diagnosis:  WEAKNESS Patient Active Problem List   Diagnosis Date Noted   PAF (paroxysmal atrial fibrillation) ([place]) 03/08/2022   Acute on chronic respiratory failure with hypoxia and hypercapnia (HCC) 03/05/2022   Effusion of knee joint, left 03/22/2021   Acute on chronic systolic CHF (congestive heart failure) ([place]) 03/19/2021   Acute on chronic heart failure ([place]) 03/14/2021   Acute hypoxemic respiratory failure ([place]) 03/14/2021   Diabetes mellitus without complication ([place]) 97/08/6376   HLD (hyperlipidemia) 04/09/2020   Depression 04/09/2020   Elevated troponin 04/09/2020   OSA (obstructive sleep apnea) 04/09/2020   Peripheral polyneuropathy 02/29/2020   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   Lumbar radiculopathy, acute    Acute midline low back pain without sciatica    Type 2 diabetes mellitus with diabetic neuropathy, without long-term current use of insulin ([place]) 02/21/2020   Hypothyroidism 02/21/2020   Morbid obesity with BMI of  50.0-59.9, adult ([place]) 02/21/2020   CAP (community acquired pneumonia) 02/21/2020   Bilateral cellulitis of lower leg 02/21/2020   Acute respiratory failure with hypoxia and hypercapnia ([place]) 02/21/2020   Acute renal failure superimposed on stage 3a chronic kidney disease ([place]) 02/21/2020   Transaminitis 01/27/2020   Lymphedema of both lower  extremities 08/21/2016   Benign essential hypertension 05/18/2016   PCP:  Rusty Aus, MD Pharmacy:   [id number]: [phone]: [phone]    Social Determinants of Health (SDOH) Interventions    Readmission Risk Interventions    03/07/2022    4:12 PM  Readmission Risk Prevention Plan  Transportation Screening Complete  PCP or Specialist Appt within 5-7 Days Complete  Home Care Screening Complete  Medication Review (RN CM) Complete

## 2022-05-20 NOTE — H&P (Signed)
History and Physical    Patient: Madison Mcbride OAC:[id number] DOB: 06-06-1956 DOA: 05/20/2022 DOS: the patient was seen and examined on 05/20/2022 PCP: Rusty Aus, MD  Patient coming from: Home  Chief Complaint:  Chief Complaint  Patient presents with   Weakness   HPI: Madison Mcbride is a 66 y.o. female with medical history significant of chronic hypoxic respiratory failure on 4 L supplemental oxygen with ambulation, HFpEF (LVEF 45-50%), OSA on BiPAP, atrial fibrillation, type 2 diabetes, hypothyroidism, who presents to the ED due to buttock sores.  Madison Mcbride states she is mostly sedentary at home and lays on her recliner the majority of the day.  She does get up occasionally to go to the kitchen and use the restroom.  She does try to reposition herself frequently while on the recliner.  In the recent days, her friend looked at her bottom for her and noticed some sores.  Patient states that they had been burning and itching but they are feeling better at this time.  She denies any fevers, chills.  In addition, she did have a fall approximately 2 days ago when her knees buckled.  She states this has been a chronic problem for which she has been following with her PCP.  When she fell, she did not hit her head or obtain any injuries.  She denies any dizziness, palpitations, chest pain at that time.  Otherwise, she denies any general weakness.  She notes that she is aware she supposed to wear her oxygen when ambulating but does not do so.  She does not check her oxygen levels at home.  Madison Mcbride states that she has been experiencing mild dysuria recently.  She denies any difficulty with urination or hematuria.  She notes that she was treated for UTI approximately 1 month ago.  ED course: On arrival to the ED, patient was afebrile at 97.7 with blood pressure 112/51, heart rate of 51 and respiratory rate of 24.  She was saturating at 94% on 2 L nasal cannula.  Initial blood work  remarkable for creatinine of 1.89 with BUN of 50.  Due to AKI, [facility] contacted for admission.  Review of Systems: As mentioned in the history of present illness. All other systems reviewed and are negative.  Past Medical History:  Diagnosis Date   CHF (congestive heart failure) ([place])    Diabetes mellitus without complication ([place])    Hypothyroidism    Lymphedema    RLS (restless legs syndrome)    Sleep apnea    Thyroid cancer ([place]) 2007   Past Surgical History:  Procedure Laterality Date   ABDOMINAL HYSTERECTOMY     COLONOSCOPY WITH PROPOFOL N/A 08/26/2015   Procedure: COLONOSCOPY WITH PROPOFOL;  Surgeon: Manya Silvas, MD;  Location: [facility];  Service: Endoscopy;  Laterality: N/A;   THYROIDECTOMY     Social History:  reports that she has never smoked. She has never used smokeless tobacco. She reports that she does not drink alcohol and does not use drugs.  No Known Allergies  Family History  Problem Relation Age of Onset   Breast cancer Neg Hx     Prior to Admission medications   Medication Sig Start Date End Date Taking? Authorizing Provider  acetaminophen (TYLENOL) 325 MG tablet Take 2 tablets (650 mg total) by mouth every 6 (six) hours as needed for mild pain, fever or headache. 03/18/22   Emeterio Reeve, DO  ALPRAZolam Duanne Moron) 0.25 MG tablet Take 1 tablet (0.25 mg  total) by mouth 3 (three) times daily as needed for anxiety. 03/18/22   Emeterio Reeve, DO  apixaban (ELIQUIS) 5 MG TABS tablet Take 1 tablet (5 mg total) by mouth 2 (two) times daily. 07/25/21   Dessa Phi, DO  atorvastatin (LIPITOR) 20 MG tablet Take 20 mg by mouth daily. 01/31/20   [provider]  Biotin 2.5 MG CAPS Take 2.5 mg by mouth daily.     [provider]  buPROPion (WELLBUTRIN XL) 150 MG 24 hr tablet Take 150 mg by mouth daily. 01/10/21   [provider]  Catheter Self-Adhesive Urinary MISC Continue PurWIck prn if available 03/18/22   Emeterio Reeve, DO   celecoxib (CELEBREX) 200 MG capsule Take 200 mg by mouth 2 (two) times daily. 02/10/22   [provider]  Cholecalciferol 25 MCG (1000 UT) tablet Take 1 tablet by mouth daily.    [provider]  docusate sodium (COLACE) 100 MG capsule Take 1 capsule (100 mg total) by mouth 2 (two) times daily as needed for mild constipation. 03/18/22   Emeterio Reeve, DO  furosemide (LASIX) 40 MG tablet Take 1 tablet (40 mg total) by mouth 2 (two) times daily. Increase to 1 tablet (40 mg total) by mouth THREE TIMES daily (total daily dose 120 mg) as needed for up to 3 days for increased leg swelling, shortness of breath, weight gain 5+ lbs over 1-2 days. Seek medical care if these symptoms are not improving with increased dose. Reduce dose to 1 tablet (40 mg total) by mouth ONCE DAILY if dizziness or low blood pressure on bid dose. 03/18/22   Emeterio Reeve, DO  gabapentin (NEURONTIN) 100 MG capsule Take 2 capsules (200 mg total) by mouth 2 (two) times daily. 03/18/22   Emeterio Reeve, DO  gabapentin (NEURONTIN) 300 MG capsule Take 1 capsule (300 mg total) by mouth at bedtime. 03/18/22   Emeterio Reeve, DO  levothyroxine (SYNTHROID) 125 MCG tablet Take 1 tablet (125 mcg total) by mouth daily. 07/26/21   Dessa Phi, DO  lidocaine (LIDODERM) 5 % Place 1 patch onto the skin daily. Remove & Discard patch within 12 hours or as directed by MD 03/18/22   Emeterio Reeve, DO  Mouthwashes (MOUTH RINSE) LIQD solution 15 mLs by Mouth Rinse route as needed (oral care). 03/18/22   Emeterio Reeve, DO  Multiple Vitamin (MULTIVITAMIN) tablet Take 1 tablet by mouth daily.    [provider]  pantoprazole (PROTONIX) 40 MG tablet Take 40 mg by mouth daily. 01/11/21   [provider]  polyethylene glycol (MIRALAX / GLYCOLAX) 17 g packet Take 17 g by mouth daily as needed for moderate constipation. 03/18/22   Emeterio Reeve, DO  potassium chloride SA (KLOR-CON M) 20 MEQ tablet Take 20 mEq by  mouth daily. 12/15/21   [provider]  rOPINIRole (REQUIP) 2 MG tablet Take 1 tablet (2 mg total) by mouth 2 (two) times daily. 03/18/22   Emeterio Reeve, DO  rOPINIRole (REQUIP) 3 MG tablet Take 1 tablet (3 mg total) by mouth at bedtime. 03/18/22   Emeterio Reeve, DO  Semaglutide, 1 MG/DOSE, 2 MG/1.5ML SOPN Inject 0.75 mLs into the skin once a week. 10/14/20   [provider]  spironolactone (ALDACTONE) 25 MG tablet Take 1 tablet (25 mg total) by mouth daily. 03/19/22   Emeterio Reeve, DO  zinc gluconate 50 MG tablet Take 50 mg by mouth daily.    [provider]    Physical Exam: Vitals:   05/20/22 9476  05/20/22 1519  BP:  (!) 112/51  Pulse:  (!) 51  Resp:  (!) 24  Temp: 97.7 F (36.5 C)   TempSrc: Oral   SpO2:  94%   Physical Exam Vitals and nursing note reviewed.  Constitutional:      Appearance: She is morbidly obese. She is not ill-appearing.  HENT:     Head: Normocephalic and atraumatic.     Mouth/Throat:     Mouth: Mucous membranes are dry.     Pharynx: Oropharynx is clear.  Eyes:     Conjunctiva/sclera: Conjunctivae normal.     Pupils: Pupils are equal, round, and reactive to light.  Cardiovascular:     Rate and Rhythm: Regular rhythm. Bradycardia present.     Heart sounds: No murmur heard.    No gallop.  Pulmonary:     Effort: Pulmonary effort is normal. No respiratory distress.     Breath sounds: Normal breath sounds. No wheezing, rhonchi or rales.  Abdominal:     General: Bowel sounds are normal.     Palpations: Abdomen is soft.  Musculoskeletal:     Comments: Bilateral nonpitting edema of the lower extremities.  Skin:    General: Skin is warm and dry.     Comments: Bilateral lower extremities with hyperpigmentation and woody texture consistent with venous stasis.  Several overlying blisters that are scabbed and healing without any purulent drainage.  Neurological:     General: No focal deficit present.     Mental Status: She  is alert and oriented to person, place, and time. Mental status is at baseline.  Psychiatric:        Mood and Affect: Mood normal.        Behavior: Behavior normal.    Data Reviewed: CBC today with WBC of 10, hemoglobin of 9.0 and platelets of 385.  CBC 2 months ago with hemoglobin of 9.1.BMP today with BUN of 50, creatinine of 1.89, GFR 29, and calcium of 8.4.  Creatinine obtained 2 months ago was 1.08.  Results are pending, will review when available.  Assessment and Plan: * Acute kidney injury [facility]) Patient presenting with an AKI with creatinine of 1.8, historically creatinine ranges between 0.8-0.9.  No prior history of CKD.  Differential includes intrarenal injury secondary to celecoxib with Lasix versus dehydration (dry mucous membranes with limited edema on examination).   - Urinalysis - UPCR and UACR - Renal ultrasound - Gentle hydration with LR at 100 cc/h - Hold nephrotoxic agents including celecoxib and Lasix - Decrease dosage of home gabapentin given reduced renal function  Microcytic anemia Patient presenting with approximately 52-monthhistory of gradually worsening microcytic anemia of which she was unaware of.  She is experiencing fatigue and ice craving, which she was unaware could be related to her anemia.  She denies any menstrual bleeding or melena/hematochezia.  Last colonoscopy in 2017.  - Daily CBC - Ferritin and iron panel pending - Transfuse for hemoglobin less than 7 - Start oral iron supplementation  Chronic hypoxic respiratory failure, on home oxygen therapy ([place] Patient was instructed on recent hospital discharge to wear 4 L of oxygen via nasal cannula when ambulating.  She has the oxygen available at home, but only wears it intermittently.  She does not check her oxygen levels at home.  - Obtain ambulatory oxygen saturation - Continue supplemental oxygen as needed to maintain O2 saturation between 88 and 95% - Avoid over oxygenating due to history of  hypercapnia.  Congestive heart failure (CHF) ([place]  History of CHF with EF of 45-50% on carvedilol and Lasix.  She is hypovolemic at this time.  - Holding home Lasix - Restart home carvedilol  Pressure ulcer of buttock - Wound care consult - Encouraged to frequently rotate position at home when on her recliner  Dysuria Patient endorsing dysuria with recent history of Klebsiella pneumoniae UTI treated with Bactrim.  Given persistent symptoms, will check urine culture  - Urinalysis pending - Urine culture pending  Chronic venous stasis dermatitis of both lower extremities Lower extremity edema that is nonpitting in nature with hyperpigmentation consistent with chronic venous stasis.  Patient states symptoms are improving.  No evidence of acute infection.  Patient would like to hold off on [place] boots at this time.  Inflammatory arthritis Per chart review, patient followed up with her PCP for concern of inflammatory arthritis.  Anti-CCP markedly elevated.  She was referred to rheumatology but has not followed up.  Given patient has had increasing falls, this would be prudent to address.  - Outpatient rheumatology follow-up  OSA (obstructive sleep apnea) - BiPAP at night  Sinus bradycardia Long-term history of chronic bradycardia per previous EKG review.  She is on both carvedilol (for heart failure) and propranolol (for tremors) that likely explains her bradycardia.  - Restart home carvedilol - Holding home propranolol as it has not been recently filled.  Type 2 diabetes mellitus with diabetic neuropathy, without long-term current use of insulin (HCC) - Hold home semaglutide - SSI, moderate  Advance Care Planning:   Code Status: Full Code   Consults: None  Family Communication: Patient's friend updated at bedside  Severity of Illness: The appropriate patient status for this patient is OBSERVATION. Observation status is judged to be reasonable and necessary in order to provide  the required intensity of service to ensure the patient's safety. The patient's presenting symptoms, physical exam findings, and initial radiographic and laboratory data in the context of their medical condition is felt to place them at decreased risk for further clinical deterioration. Furthermore, it is anticipated that the patient will be medically stable for discharge from the hospital within 2 midnights of admission.   Author: Jose Persia, MD 05/20/2022 6:06 PM  For on call review [email or web address].

## 2022-05-20 NOTE — Assessment & Plan Note (Signed)
-   Hold home semaglutide - SSI, moderate

## 2022-05-20 NOTE — Assessment & Plan Note (Addendum)
Patient presenting with an AKI with creatinine of 1.8, historically creatinine ranges between 0.8-0.9.  No prior history of CKD.  Differential includes intrarenal injury secondary to celecoxib with Lasix versus dehydration (dry mucous membranes with limited edema on examination).   - Urinalysis - UPCR and UACR - Renal ultrasound - Gentle hydration with LR at 100 cc/h - Hold nephrotoxic agents including celecoxib and Lasix - Decrease dosage of home gabapentin given reduced renal function

## 2022-05-20 NOTE — Assessment & Plan Note (Addendum)
Long-term history of chronic bradycardia per previous EKG review.  She is on both carvedilol (for heart failure) and propranolol (for tremors) that likely explains her bradycardia.  - Restart home carvedilol - Holding home propranolol as it has not been recently filled.

## 2022-05-21 ENCOUNTER — Encounter: Payer: Self-pay | Admitting: Internal Medicine

## 2022-05-21 DIAGNOSIS — N179 Acute kidney failure, unspecified: Secondary | ICD-10-CM | POA: Diagnosis not present

## 2022-05-21 LAB — GLUCOSE, CAPILLARY
Glucose-Capillary: 97 mg/dL (ref 70–99)
Glucose-Capillary: 130 mg/dL — ABNORMAL HIGH (ref 70–99)

## 2022-05-21 LAB — CBC
HCT: 30.8 % — ABNORMAL LOW (ref 36.0–46.0)
Hemoglobin: 8.5 g/dL — ABNORMAL LOW (ref 12.0–15.0)
MCH: 21.6 pg — ABNORMAL LOW (ref 26.0–34.0)
MCHC: 27.6 g/dL — ABNORMAL LOW (ref 30.0–36.0)
MCV: 78.4 fL — ABNORMAL LOW (ref 80.0–100.0)
Platelets: 358 10*3/uL (ref 150–400)
RBC: 3.93 MIL/uL (ref 3.87–5.11)
RDW: 20.6 % — ABNORMAL HIGH (ref 11.5–15.5)
WBC: 10.4 10*3/uL (ref 4.0–10.5)
nRBC: 0.3 % — ABNORMAL HIGH (ref 0.0–0.2)

## 2022-05-21 LAB — BASIC METABOLIC PANEL
Anion gap: 5 (ref 5–15)
BUN: 49 mg/dL — ABNORMAL HIGH (ref 8–23)
CO2: 29 mmol/L (ref 22–32)
Calcium: 8.2 mg/dL — ABNORMAL LOW (ref 8.9–10.3)
Chloride: 106 mmol/L (ref 98–111)
Creatinine, Ser: 1.9 mg/dL — ABNORMAL HIGH (ref 0.44–1.00)
GFR, Estimated: 28 mL/min — ABNORMAL LOW (ref >60)
Glucose, Bld: 71 mg/dL (ref 70–99)
Potassium: 4.5 mmol/L (ref 3.5–5.1)
Sodium: 140 mmol/L (ref 135–145)

## 2022-05-21 LAB — CBG MONITORING, ED
Glucose-Capillary: 68 mg/dL — ABNORMAL LOW (ref 70–99)
Glucose-Capillary: 74 mg/dL (ref 70–99)
Glucose-Capillary: 92 mg/dL (ref 70–99)

## 2022-05-21 MED ORDER — LEVOTHYROXINE SODIUM 50 MCG PO TABS
250.0000 ug | ORAL_TABLET | Freq: Every day | ORAL | Status: DC
  Administered 2022-05-22: 250 ug via ORAL
  Filled 2022-05-21: qty 1

## 2022-05-21 MED ORDER — ZINC OXIDE 20 % EX OINT
TOPICAL_OINTMENT | Freq: Two times a day (BID) | CUTANEOUS | Status: DC
  Administered 2022-05-21: 1 via TOPICAL
  Filled 2022-05-21 (×3): qty 56.7

## 2022-05-21 MED ORDER — LACTATED RINGERS IV SOLN: INTRAVENOUS | Status: DC

## 2022-05-21 NOTE — Consult Note (Signed)
WOC Nurse Consult Note: Patient receiving care in [facility] ED 31. Primary RN present at time of my assessment. Reason for Consult: "pressure ulcers stage 1 & 2" Wound type: DTPI to right buttock, measures 2 cm x 1 cm is non-blanchable purple.  Left buttock DTPI measures 3 cm x 1 c  is non-blanchable purple in color. There is some MASD along the lower buttock area with scattered, small abrasion. There is foul smelling MASD-ITD fluid in the abdominal pannus fold with fungal overgrowth. There are scattered superfiicial wounds along the LLE from edema, none look "infected"; none have drainage. Pressure Injury POA: Yes Measurement: Wound bed: Drainage (amount, consistency, odor)  Periwound: intact Dressing procedure/placement/frequency: For the abdominal MASD-ITD with fungal overgrowth: Have the [place] to order Phil Dopp Kellie Simmering [phone]) for use in the abdominal fold using these instructions:   Measure and cut length of InterDry to fit in skin folds that have skin breakdown  Tuck InterDry fabric into skin folds in a single layer, allow for 2 inches of overhang from skin edges to allow for wicking to occur May remove to bathe; dry area thoroughly and then tuck into affected areas again  Do not apply any creams or ointments when using InterDry DO NOT THROW AWAY FOR 5 DAYS unless soiled with stool DO NOT [facility] product, this will inactivate the silver in the material  New sheet of Interdry should be applied after 5 days of use if patient continues to have skin breakdown.  For the buttock, I have ordered washing with soap and water, then application of Triple Paste bid.  For the LLE, Wash LLE with soap and water. Pat dry. Apply Sween Moisturizing Ointment; cover wound areas with Xeroform gauze Kellie Simmering [phone]) . Beginning behind the toes and going to just below the knees, spiral wrap kerlix; perform daily.   I have also added turning orders and to minimize pressure to the sacral/coccyx/buttocks.  Monitor the  wound area(s) for worsening of condition such as: Signs/symptoms of infection,  Increase in size,  Development of or worsening of odor, Development of pain, or increased pain at the affected locations.  Notify the medical team if any of these develop.  Thank you for the consult.  Discussed plan of care with the patient and bedside nurse.  [place] nurse will not follow at this time.  Please re-consult the [place] team if needed.  Val Riles, RN, MSN, CWOCN, CNS-BC, pager [phone]

## 2022-05-21 NOTE — Progress Notes (Signed)
End of shift note:   Pt was admitted to the floor. Wound care was provided. IVF started . VSS.

## 2022-05-21 NOTE — Hospital Course (Addendum)
66 y.o.f w/ hx of  chronic hypoxic respiratory failure on 4 L [place] with ambulation, HFpEF (LVEF 45-50%), OSA on BiPAP, atrial fibrillation, T2DM,hypothyroidism presented with buttock source. She is mostly sedentary at home, in recent days her bottom was having sores and patient is having burning and itching.  She also endorses fall 2 days PTA where her knees buckled-did not hit her head or had any injuries. Complaining of mild dysuria recently. In the ED vitals stable saturating 98% 2 L,Lab work showed elevated creatinine 1.8 and admitted for AKI

## 2022-05-21 NOTE — Progress Notes (Signed)
PROGRESS NOTE Madison Mcbride  VZD:[id number] DOB: November 01, 1955 DOA: 05/20/2022 PCP: Rusty Aus, MD   Brief Narrative/Hospital Course: 66 y.o.f w/ hx of  chronic hypoxic respiratory failure on 4 L [place] with ambulation, HFpEF (LVEF 45-50%), OSA on BiPAP, atrial fibrillation, T2DM,hypothyroidism presented with buttock source. She is mostly sedentary at home, in recent days her bottom was having sores and patient is having burning and itching.  She also endorses fall 2 days PTA where her knees buckled-did not hit her head or had any injuries. Complaining of mild dysuria recently. In the ED vitals stable saturating 98% 2 L,Lab work showed elevated creatinine 1.8 and admitted for AKI   Subjective: Seen this am Alert awake oriented Bottom is not burning now Denies nausea vomiting abd pain. Tolerating po well Urine output not measured- canister from purewick about 650 cc   Assessment and Plan: Principal Problem:   Acute kidney injury ([place]) Active Problems:   Microcytic anemia   Chronic hypoxic respiratory failure, on home oxygen therapy (HCC)   Congestive heart failure (CHF) (HCC)   Pressure ulcer of buttock   Dysuria   Chronic venous stasis dermatitis of both lower extremities   Inflammatory arthritis   OSA (obstructive sleep apnea)   Type 2 diabetes mellitus with diabetic neuropathy, without long-term current use of insulin (HCC)   Sinus bradycardia   AKI: B/L creatinine 0.8-0.9.  Holding at 1.9, continue fluid and avoid nephrotoxic meds, renally dose meds Recent Labs    03/10/22 0529 03/11/22 0413 03/12/22 0631 03/14/22 0421 03/15/22 0328 03/16/22 2951 03/17/22 0418 03/18/22 0432 05/20/22 1542 05/21/22 0432  BUN 25* 24* 24* '22 21 20 22 '$ 24* 50* 49*  CREATININE 1.01* 1.05* 0.89 0.94 0.92 0.97 1.09* 1.08* 1.89* 1.90*    Microcytic anemia, reports has hemorrhoids and gets rectal bleed- cleared up now- had episode past weekend. Monitor. Of note she is on eliquis.check  anemia panel. Recent Labs  Lab 05/20/22 1542 05/21/22 0432  HGB 9.0* 8.5*  HCT 32.8* 30.8*    Chronic hypoxic respiratory failure on 4 L nasal cannula oxygen on ambulation OSA on BiPAP at night: Did not get bipap- ordered for night. Cont [place] o2 and monitor  Atrial fibrillation continue Eliquis adjust for renal dosing if needed.  Coreg on hold due to soft BP Sinus bradycardia long-term history-monitor. Coreg held Type 2 diabetes mellitus, w/ hypoglycemia 66, keep on SSI holding home semaglutide Recent Labs  Lab 05/21/22 0005 05/21/22 0922 05/21/22 1249  GLUCAP 74 68* 92    Congestive heart failure with preserved EF w/ chronic leg edema: BP soft hold Coreg, hold Lasix 2/2 AKI. Monitor strict I/o, daily weight  Low TSH- Check FT4  Dysuria UA unremarkable Chronic venous stasis dermatitis of both lower extremities w/ swelling Inflammatory arthritis elevated anti-CCP referred to rheumatology Pressure ulcer of buttock POA wound care consulted   Morbid Obesity:Patient's Body mass index is 51.33 kg/m. : Will benefit with PCP follow-up, weight loss  healthy lifestyle   DVT prophylaxis: eliquis Code Status:   Code Status: Full Code Family Communication: plan of care discussed with patient at bedside. Patient status is: will remains hospitalized needign at least 2 night for aki.  Level of care: Med-Surg  Dispo: The patient is from: [place] w/ husband            Anticipated disposition: TBD Mobility Assessment (last 72 hours)     Mobility Assessment     Row Name 05/20/22 23:58:43  Does patient have an order for bedrest or is patient medically unstable Yes- Bedfast (Level 1) - Complete                 Objective: Vitals last 24 hrs: Vitals:   05/20/22 2311 05/21/22 0229 05/21/22 0452 05/21/22 1050  BP: (!) 111/48 (!) 108/48 (!) 96/52 106/66  Pulse: 60 (!) 57 (!) 58 62  Resp: '18 18 18 '$ (!) 22  Temp: 98.5 F (36.9 C) 98.5 F (36.9 C)  98 F (36.7 C)   TempSrc: Oral Oral  Oral  SpO2: 94% 95% 97% 98%  Weight:      Height:       Weight change:   Physical Examination: General exam: alert awake,obese, older than stated age HEENT:Oral mucosa moist, Ear/Nose WNL grossly Respiratory system: bilaterally diminished BS,no use of accessory muscle Cardiovascular system: S1 & S2 +, No JVD. Gastrointestinal system: Abdomen soft,NT,ND, BS+ Nervous System:Alert, awake, moving extremities. Extremities: LE edema + chronic appearing with blisters, redness Skin: No rashes,no icterus. MSK: Normal muscle bulk,tone, power  Medications reviewed:  Scheduled Meds:  apixaban  5 mg Oral BID   atorvastatin  20 mg Oral Daily   buPROPion  150 mg Oral Daily   gabapentin  200 mg Oral TID   insulin aspart  0-15 Units Subcutaneous TID WC   iron polysaccharides  150 mg Oral Daily   levothyroxine  125 mcg Oral Q0600   multivitamin with minerals  1 tablet Oral Daily   pantoprazole  40 mg Oral Daily   rOPINIRole  3 mg Oral QHS   sodium chloride flush  3 mL Intravenous Q12H   zinc oxide   Topical BID  Continuous Infusions:  lactated ringers 75 mL/hr at 05/21/22 1152    Diet Order             Diet regular Room service appropriate? Yes; Fluid consistency: Thin  Diet effective now                 No intake or output data in the 24 hours ending 05/21/22 1330 Net IO Since Admission: No IO data has been entered for this period [05/21/22 1330]  Wt Readings from Last 3 Encounters:  05/20/22 (!) 144.2 kg  03/19/22 (!) 144 kg  07/29/21 (!) 146.2 kg     Unresulted Labs (From admission, onward)     Start     Ordered   05/20/22 1742  Microalbumin / creatinine urine ratio  Once,   R        05/20/22 1742   05/20/22 1732  Urine Culture  (Urine Culture)  Once,   R       Question:  Indication  Answer:  Dysuria   05/20/22 1733          Data Reviewed: I have personally reviewed following labs and imaging studies CBC: Recent Labs  Lab 05/20/22 1542  05/21/22 0432  WBC 10.0 10.4  NEUTROABS 7.4  --   HGB 9.0* 8.5*  HCT 32.8* 30.8*  MCV 77.4* 78.4*  PLT 385 161   Basic Metabolic Panel: Recent Labs  Lab 05/20/22 1542 05/21/22 0432  NA 141 140  K 4.8 4.5  CL 105 106  CO2 27 29  GLUCOSE 89 71  BUN 50* 49*  CREATININE 1.89* 1.90*  CALCIUM 8.4* 8.2*   Recent Labs    05/20/22 1542  TSH 0.202*  No results found for this or any previous visit (from the past 240 hour(s)).  Antimicrobials:  Anti-infectives (From admission, onward)    None      Culture/Microbiology    Component Value Date/Time   SDES  03/06/2022 1638    IN/OUT CATH URINE Performed at [facility], [address] [place], [place] [address]    [facility]  03/06/2022 1638    NONE Performed at [facility], [address] [place], [place] [address]    CULT  03/06/2022 1638    NO GROWTH Performed at [facility], [place] [address], [place], [place] [address]    REPTSTATUS 03/08/2022 FINAL 03/06/2022 1638    Other culture-see note  Radiology Studies: US RENAL  Result Date: 05/20/2022 CLINICAL DATA:  Acute kidney injury EXAM: RENAL / URINARY TRACT ULTRASOUND COMPLETE COMPARISON:  04/11/2020 FINDINGS: Right Kidney: Renal measurements: 10.6 x 5.7 x 5.3 cm = volume: 169 mL. Echogenicity within normal limits. 14 x 10 x 10 mm upper pole simple cyst, unchanged, benign. No hydronephrosis. Left Kidney: Renal measurements: 10.1 x 5.4 x 4.8 cm = volume: 135 mL. Echogenicity within normal limits. No mass or hydronephrosis visualized. Bladder: Appears normal for degree of bladder distention. Other: None. IMPRESSION: 14 mm right upper pole simple cyst, unchanged, benign. No follow-up is recommended. No hydronephrosis. Electronically Signed   By: Julian Hy M.D.   On: 05/20/2022 21:41     LOS: 0 days   Antonieta Pert, MD [facility]  05/21/2022, 1:30 PM

## 2022-05-21 NOTE — ED Notes (Signed)
Informed RN bed assigned 

## 2022-05-22 DIAGNOSIS — N179 Acute kidney failure, unspecified: Secondary | ICD-10-CM | POA: Diagnosis not present

## 2022-05-22 LAB — BASIC METABOLIC PANEL
Anion gap: 7 (ref 5–15)
BUN: 41 mg/dL — ABNORMAL HIGH (ref 8–23)
CO2: 31 mmol/L (ref 22–32)
Calcium: 8.2 mg/dL — ABNORMAL LOW (ref 8.9–10.3)
Chloride: 105 mmol/L (ref 98–111)
Creatinine, Ser: 1.51 mg/dL — ABNORMAL HIGH (ref 0.44–1.00)
GFR, Estimated: 37 mL/min — ABNORMAL LOW (ref >60)
Glucose, Bld: 107 mg/dL — ABNORMAL HIGH (ref 70–99)
Potassium: 4.3 mmol/L (ref 3.5–5.1)
Sodium: 143 mmol/L (ref 135–145)

## 2022-05-22 LAB — CBC
HCT: 31.3 % — ABNORMAL LOW (ref 36.0–46.0)
Hemoglobin: 8.5 g/dL — ABNORMAL LOW (ref 12.0–15.0)
MCH: 21.2 pg — ABNORMAL LOW (ref 26.0–34.0)
MCHC: 27.2 g/dL — ABNORMAL LOW (ref 30.0–36.0)
MCV: 78.1 fL — ABNORMAL LOW (ref 80.0–100.0)
Platelets: 390 10*3/uL (ref 150–400)
RBC: 4.01 MIL/uL (ref 3.87–5.11)
RDW: 20.3 % — ABNORMAL HIGH (ref 11.5–15.5)
WBC: 12.8 10*3/uL — ABNORMAL HIGH (ref 4.0–10.5)
nRBC: 0.5 % — ABNORMAL HIGH (ref 0.0–0.2)

## 2022-05-22 LAB — GLUCOSE, CAPILLARY
Glucose-Capillary: 96 mg/dL (ref 70–99)
Glucose-Capillary: 108 mg/dL — ABNORMAL HIGH (ref 70–99)
Glucose-Capillary: 112 mg/dL — ABNORMAL HIGH (ref 70–99)

## 2022-05-22 LAB — MICROALBUMIN / CREATININE URINE RATIO
Creatinine, Urine: 64.6 mg/dL
Microalb Creat Ratio: 16 mg/g creat (ref 0–29)
Microalb, Ur: 10.6 ug/mL — ABNORMAL HIGH

## 2022-05-22 LAB — T4, FREE: Free T4: 2.26 ng/dL — ABNORMAL HIGH (ref 0.61–1.12)

## 2022-05-22 MED ORDER — LEVOTHYROXINE SODIUM 50 MCG PO TABS
125.0000 ug | ORAL_TABLET | Freq: Every day | ORAL | Status: DC
  Administered 2022-05-23 – 2022-05-29 (×7): 125 ug via ORAL
  Filled 2022-05-22 (×7): qty 1

## 2022-05-22 MED ORDER — PROPRANOLOL HCL 10 MG PO TABS
20.0000 mg | ORAL_TABLET | Freq: Three times a day (TID) | ORAL | Status: DC
  Administered 2022-05-22 – 2022-05-27 (×15): 20 mg via ORAL
  Filled 2022-05-22 (×15): qty 2

## 2022-05-22 NOTE — Evaluation (Signed)
Physical Therapy Evaluation Patient Details Name: Madison Mcbride MRN: [id number] DOB: 03/17/1956 Today's Date: 05/22/2022  History of Present Illness  Pt is a 66 y.o. female presenting to hospital 05/20/22.  Per MD note "She is mostly sedentary at home, in recent days her bottom was having sores and patient is having burning and itching.  She also endorses fall 2 days PTA where her knees buckled-did not hit her head or had any injuries.  Complaining of mild dysuria recently."   Pt admitted with AKI, microcytic anemia, CHF, chronic hypoxic respiratory failure (on home O2), pressure ulcer of buttock, dysuria, chronic venous stasis dermatitis of B LE's, inflammatory arthritis, OSA, and sinus bradycardia.  PMH includes CHF, DM, lymphedema, RLS, sleep apnea, thyroid CA.  Clinical Impression  Prior to hospital admission, pt was modified independent ambulating shorter household distances with rollator; sleeps in lift chair; lives with her husband in 1 level home with 5 STE B railings.  Currently pt is max assist with bed mobility; able to come to almost full stand from bed on 3rd attempt with bed height elevated and max assist x1 (and bariatric RW use); max assist (x5 trials using bed pad) to scoot towards L along bed towards head of bed.  Pt would benefit from skilled PT to address noted impairments and functional limitations (see below for any additional details).  Upon hospital discharge, pt would benefit from SNF.      Recommendations for follow up therapy are one component of a multi-disciplinary discharge planning process, led by the attending physician.  Recommendations may be updated based on patient status, additional functional criteria and insurance authorization.  Follow Up Recommendations Skilled nursing-short term rehab (<3 hours/day) Can patient physically be transported by private vehicle: No    Assistance Recommended at Discharge Frequent or constant Supervision/Assistance  Patient  can return home with the following  Two people to help with walking and/or transfers;Two people to help with bathing/dressing/bathroom;Assistance with cooking/housework;Assist for transportation;Help with stairs or ramp for entrance    Equipment Recommendations Rolling walker (2 wheels);Wheelchair (measurements PT);BSC/3in1;Wheelchair cushion (measurements PT);Hospital bed (bariatric RW)  Recommendations for Other Services       Functional Status Assessment Patient has had a recent decline in their functional status and demonstrates the ability to make significant improvements in function in a reasonable and predictable amount of time.     Precautions / Restrictions Precautions Precautions: Fall Restrictions Weight Bearing Restrictions: No      Mobility  Bed Mobility Overal bed mobility: Needs Assistance Bed Mobility: Supine to Sit, Sit to Supine     Supine to sit: Max assist Sit to supine: Max assist   General bed mobility comments: assist for B LE's and trunk    Transfers Overall transfer level: Needs assistance Equipment used: Rolling walker (2 wheels) (bariatric RW) Transfers: Sit to/from Stand Sit to Stand: From elevated surface, Max assist           General transfer comment: x3 trials (unable to stand up on 1st 2 trials but able to come to almost upright stand on 3rd trial); from elevated surface    Ambulation/Gait               General Gait Details: pt unable to take any steps with 1 assist and walker use  Stairs            Wheelchair Mobility    Modified Rankin (Stroke Patients Only)       Balance Overall balance assessment: Needs assistance  Sitting-balance support: No upper extremity supported, Feet supported Sitting balance-Leahy Scale: Good Sitting balance - Comments: steady sitting reaching within BOS   Standing balance support: Reliant on assistive device for balance, Bilateral upper extremity supported, During functional  activity Standing balance-Leahy Scale: Poor Standing balance comment: assist to steady in standing (B UE support on walker)                             Pertinent Vitals/Pain Pain Assessment Pain Assessment: Faces Faces Pain Scale: Hurts little more Pain Location: B LEs Pain Descriptors / Indicators: Discomfort, Grimacing Pain Intervention(s): Limited activity within patient's tolerance, Monitored during session, Repositioned HR WFL during sessions activities.  O2 sats 84-90% on 3 L O2 during activities but 93% at rest on 3 L O2 beginning/end of session.    Home Living Family/patient expects to be discharged to:: Private residence Living Arrangements: Spouse/significant other Available Help at Discharge: Family Type of Home: House Home Access: Stairs to enter Entrance Stairs-Rails: Right;Left;Can reach both Entrance Stairs-Number of Steps: 5   Home Layout: One level Home Equipment: Shower seat;Wheelchair - manual;BSC/3in1;Rollator (4 wheels);Grab bars - tub/shower Additional Comments: lift chair; PRN O2 use    Prior Function Prior Level of Function : Independent/Modified Independent             Mobility Comments: Ambulates with rollator short distances at home at baseline; 2 recent falls ADLs Comments: Per OT eval "pt reports MOD I in ADL, uses a reacher. does not normally wear socks, just slip on shoes. She has a walk in bathtub."     Hand Dominance   Dominant Hand: Right    Extremity/Trunk Assessment   Upper Extremity Assessment Upper Extremity Assessment: Generalized weakness    Lower Extremity Assessment Lower Extremity Assessment: Generalized weakness    Cervical / Trunk Assessment Cervical / Trunk Assessment: Other exceptions Cervical / Trunk Exceptions: forward head/shoulders  Communication   Communication: No difficulties  Cognition Arousal/Alertness: Awake/alert Behavior During Therapy: WFL for tasks assessed/performed Overall Cognitive  Status: Within Functional Limits for tasks assessed                                 General Comments: A&O x4; very pleasant and cooperative        General Comments General comments (skin integrity, edema, etc.): B LE's wrapped.  Nursing cleared pt for participation in physical therapy.  Pt agreeable to PT session.  Pt's husband present towards end of session.    Exercises     Assessment/Plan    PT Assessment Patient needs continued PT services  PT Problem List Decreased strength;Decreased activity tolerance;Decreased balance;Decreased mobility;Decreased knowledge of use of DME;Decreased knowledge of precautions;Cardiopulmonary status limiting activity;Pain;Decreased skin integrity       PT Treatment Interventions DME instruction;Gait training;Stair training;Functional mobility training;Therapeutic activities;Therapeutic exercise;Balance training;Patient/family education    PT Goals (Current goals can be found in the Care Plan section)  Acute Rehab PT Goals Patient Stated Goal: to improve strength and mobility PT Goal Formulation: With patient Time For Goal Achievement: 06/05/22 Potential to Achieve Goals: Fair    Frequency Min 2X/week     Co-evaluation               AM-PAC PT "6 Clicks" Mobility  Outcome Measure Help needed turning from your back to your side while in a flat bed without using bedrails?: A Lot Help needed  moving from lying on your back to sitting on the side of a flat bed without using bedrails?: A Lot Help needed moving to and from a bed to a chair (including a wheelchair)?: Total Help needed standing up from a chair using your arms (e.g., wheelchair or bedside chair)?: A Lot Help needed to walk in hospital room?: Total Help needed climbing 3-5 steps with a railing? : Total 6 Click Score: 9    End of Session Equipment Utilized During Treatment: Gait belt Activity Tolerance: Patient limited by fatigue Patient left: in bed;with call  bell/phone within reach;with bed alarm set;with family/visitor present;Other (comment) (B heels floating via pillow support) Nurse Communication: Mobility status;Precautions PT Visit Diagnosis: Other abnormalities of gait and mobility (R26.89);Muscle weakness (generalized) (M62.81);History of falling (Z91.81)    Time: 1975-8832 PT Time Calculation (min) (ACUTE ONLY): 43 min   Charges:   PT Evaluation $PT Eval Low Complexity: 1 Low PT Treatments $Therapeutic Activity: 23-37 mins       Leitha Bleak, PT 05/22/22, 5:13 PM

## 2022-05-22 NOTE — Evaluation (Signed)
Occupational Therapy Evaluation Patient Details Name: Madison Mcbride MRN: [id number] DOB: [birth year] Today's Date: 05/22/2022   History of Present Illness 66 y.o.f w/ hx of  chronic hypoxic respiratory failure on 4 L [place] with ambulation, HFpEF (LVEF 45-50%), OSA on BiPAP, atrial fibrillation, T2DM,hypothyroidism presented with buttock source.  She is mostly sedentary at home, in recent days her bottom was having sores and patient is having burning and itching.  She also endorses fall 2 days PTA where her knees buckled-did not hit her head or had any injuries.   Clinical Impression   Patient presenting with decreased Ind in self care, balance, functional mobility/transfers, endurance, and safety awareness. Patient reports living at home with husband who is able to assist her. Pt sleeps in lift chair and ambulates short distances with AD into bathroom and to kitchen. She reports being able to perform self care tasks without assistance or minimal assist as needed. She has O2 at baseline to use "when I need it" . Pt needing heavy max A for bed mobility but is able to sit EOB with close supervision for static sitting balance. Pt able to slip feet into slip on shoes with min A while seated EOB.  Patient unable to stand on first attempt. Bed height elevated and pt needing heavy heavy max A to stand as she keeps attempting to pull up on RW into standing and needing heavy facilitation of anterior weight shift to come into stands. Several small side steps along EOB towards the R side. Pt fatigues easily with tasks. Patient will benefit from acute OT to increase overall independence in the areas of ADLs, functional mobility, and safety awareness in order to safely discharge to next venue of care.      Recommendations for follow up therapy are one component of a multi-disciplinary discharge planning process, led by the attending physician.  Recommendations may be updated based on patient status, additional  functional criteria and insurance authorization.   Follow Up Recommendations  Skilled nursing-short term rehab (<3 hours/day)    Assistance Recommended at Discharge Intermittent Supervision/Assistance  Patient can return home with the following A lot of help with walking and/or transfers;A lot of help with bathing/dressing/bathroom;Assistance with cooking/housework;Help with stairs or ramp for entrance;Assist for transportation    Functional Status Assessment  Patient has had a recent decline in their functional status and demonstrates the ability to make significant improvements in function in a reasonable and predictable amount of time.  Equipment Recommendations  Other (comment) (defer to next venue of care)       Precautions / Restrictions Precautions Precautions: Fall      Mobility Bed Mobility Overal bed mobility: Needs Assistance Bed Mobility: Supine to Sit, Sit to Supine     Supine to sit: Max assist Sit to supine: Max assist   General bed mobility comments: assistance for B LEs and trunk support    Transfers Overall transfer level: Needs assistance Equipment used: Rolling walker (2 wheels) Transfers: Sit to/from Stand Sit to Stand: From elevated surface, Max assist           General transfer comment: heavy max A to stand from elevated bed height.      Balance Overall balance assessment: Needs assistance Sitting-balance support: Feet supported Sitting balance-Leahy Scale: Good     Standing balance support: Reliant on assistive device for balance, Bilateral upper extremity supported Standing balance-Leahy Scale: Poor  ADL either performed or assessed with clinical judgement   ADL Overall ADL's : Needs assistance/impaired                     Lower Body Dressing: Minimal assistance Lower Body Dressing Details (indicate cue type and reason): to don B slip on shoes. Pt would likely need max- total A to don  underwear and pants.                     Vision Patient Visual Report: No change from baseline              Pertinent Vitals/Pain Pain Assessment Pain Assessment: Faces Faces Pain Scale: Hurts little more Pain Location: B LEs Pain Descriptors / Indicators: Discomfort, Grimacing Pain Intervention(s): Limited activity within patient's tolerance, Monitored during session, Repositioned     Hand Dominance Right   Extremity/Trunk Assessment Upper Extremity Assessment Upper Extremity Assessment: Generalized weakness   Lower Extremity Assessment Lower Extremity Assessment: Generalized weakness       Communication Communication Communication: No difficulties   Cognition Arousal/Alertness: Awake/alert Behavior During Therapy: WFL for tasks assessed/performed Overall Cognitive Status: Within Functional Limits for tasks assessed                                 General Comments: Pt is A &O x4 and very pleasant and cooperative throughout                Home Living Family/patient expects to be discharged to:: Private residence Living Arrangements: Spouse/significant other Available Help at Discharge: Family;Available 24 hours/day Type of Home: House Home Access: Stairs to enter [employer] of Steps: 5 Entrance Stairs-Rails: Right;Left;Can reach both Home Layout: One level     Bathroom Shower/Tub: Teacher, early years/pre: Handicapped height Bathroom Accessibility: Yes   Home Equipment: Conservation officer, nature (2 wheels);Shower seat;Wheelchair - manual;BSC/3in1   Additional Comments: lift chair      Prior Functioning/Environment Prior Level of Function : Independent/Modified Independent             Mobility Comments: rollator at baseline ADLs Comments: pt reports MOD I in ADL, uses a reacher. does not normally wear socks, just slip on shoes. She has a walk in bathtub.        OT Problem List: Decreased  strength;Cardiopulmonary status limiting activity;Decreased activity tolerance;Decreased safety awareness;Impaired balance (sitting and/or standing);Decreased knowledge of use of DME or AE      OT Treatment/Interventions: Self-care/ADL training;Therapeutic exercise;Therapeutic activities;DME and/or AE instruction;Manual therapy;Balance training;Patient/family education;Energy conservation    OT Goals(Current goals can be found in the care plan section) Acute Rehab OT Goals Patient Stated Goal: to get stronger and return home OT Goal Formulation: With patient Time For Goal Achievement: 06/05/22 Potential to Achieve Goals: Fair ADL Goals Pt Will Perform Grooming: standing;with supervision Pt Will Perform Lower Body Dressing: with min assist;sit to/from stand Pt Will Transfer to Toilet: with min assist Pt Will Perform Toileting - Clothing Manipulation and hygiene: with min assist;sit to/from stand  OT Frequency: Min 2X/week       AM-PAC OT "6 Clicks" Daily Activity     Outcome Measure Help from another person eating meals?: None Help from another person taking care of personal grooming?: A Little Help from another person toileting, which includes using toliet, bedpan, or urinal?: Total Help from another person bathing (including washing, rinsing, drying)?: A Lot Help from another  person to put on and taking off regular upper body clothing?: A Little Help from another person to put on and taking off regular lower body clothing?: Total 6 Click Score: 14   End of Session Equipment Utilized During Treatment: Rolling walker (2 wheels);Oxygen Nurse Communication: Mobility status  Activity Tolerance: Patient limited by fatigue Patient left: in bed;with call bell/phone within reach;with bed alarm set  OT Visit Diagnosis: Unsteadiness on feet (R26.81);Repeated falls (R29.6);Muscle weakness (generalized) (M62.81)                Time: 4446-1901 OT Time Calculation (min): 23 min Charges:  OT  General Charges $OT Visit: 1 Visit OT Evaluation $OT Eval Moderate Complexity: 1 Mod OT Treatments $Therapeutic Activity: 8-22 mins  Darleen Crocker, MS, OTR/L , CBIS ascom [phone]  05/22/22, 1:41 PM

## 2022-05-22 NOTE — Progress Notes (Signed)
PROGRESS NOTE Madison Mcbride  WUJ:[id number] DOB: April 20, 1956 DOA: 05/20/2022 PCP: Rusty Aus, MD   Brief Narrative/Hospital Course: 66 y.o.f w/ hx of  chronic hypoxic respiratory failure on 4 L [place] with ambulation, HFpEF (LVEF 45-50%), OSA on BiPAP, atrial fibrillation, T2DM,hypothyroidism presented with buttock source. She is mostly sedentary at home, in recent days her bottom was having sores and patient is having burning and itching.  She also endorses fall 2 days PTA where her knees buckled-did not hit her head or had any injuries. Complaining of mild dysuria recently. In the ED vitals stable saturating 98% 2 L,Lab work showed elevated creatinine 1.8 and admitted for AKI   Subjective: Feels better  Legs are wrapped with acre wrap Overnight afebrile  Labs shows improving renal function, free T4 high at 2.2, slightly up WBC  On [place]   Assessment and Plan: Principal Problem:   Acute kidney injury ([place]) Active Problems:   Microcytic anemia   Chronic hypoxic respiratory failure, on home oxygen therapy (HCC)   Congestive heart failure (CHF) (HCC)   Pressure ulcer of buttock   Dysuria   Chronic venous stasis dermatitis of both lower extremities   Inflammatory arthritis   OSA (obstructive sleep apnea)   Type 2 diabetes mellitus with diabetic neuropathy, without long-term current use of insulin (HCC)   Sinus bradycardia AKI: B/L creatinine 0.8-0.9.  Improved to 1.5 keep on gentle IV fluid hydration cut down the rate given her CHF leg swelling.  Continue to avoid nephrotoxic meds, renally dose meds Recent Labs    03/11/22 0413 03/12/22 0631 03/14/22 0421 03/15/22 0328 03/16/22 9562 03/17/22 0418 03/18/22 0432 05/20/22 1542 05/21/22 0432 05/22/22 0317  BUN 24* 24* '22 21 20 22 '$ 24* 50* 49* 41*  CREATININE 1.05* 0.89 0.94 0.92 0.97 1.09* 1.08* 1.89* 1.90* 1.51*    Microcytic anemia, reports has hemorrhoids and gets rectal bleed- had episode past weekend. Monitor. Of note  she is on eliquis.mild low iron studies.  Continue home iron supplement.  Continue to monitor hemoglobin.   Recent Labs    03/17/22 0418 03/18/22 0432 05/20/22 1542 05/21/22 0432 05/22/22 0317  HGB 10.1* 9.1* 9.0* 8.5* 8.5*  MCV 85.9 86.7 77.4* 78.4* 78.1*  FERRITIN  --   --  17  --   --   TIBC  --   --  388  --   --   IRON  --   --  21*  --   --     Chronic hypoxic respiratory failure on 4 L nasal cannula oxygen on ambulation OSA on BiPAP at night: Continue BiPAP at bedtime.  Continue supplemental oxygen.    Atrial fibrillation rate controlled, continue Eliquis.  Coreg for soft blood pressures, patient on propanolol.  Sinus bradycardia long-term history-monitor. Coreg held Type 2 diabetes mellitus, w/ hypoglycemia 68> resolved.  Continue sliding scale insulin, cont holding home semaglutide Recent Labs  Lab 05/21/22 0922 05/21/22 1249 05/21/22 1508 05/21/22 2109 05/22/22 0827  GLUCAP 68* 92 97 130* 96    Congestive heart failure with preserved EF w/ chronic leg edema: BP soft hold Coreg, hold Lasix 2/2 AKI. Monitor strict I/o, daily weight  Hypothyroidism Low TSH- Checked FT4 and is high: Patient has been taking 250 mcg of Synthroid, dose reduced to half we will need follow-up thyroid function test in 2 to 3 weeks to adjust her medication  Dysuria UA unremarkable Chronic venous stasis dermatitis of both lower extremities w/ swelling, continue Ace wrap wound care. Inflammatory arthritis  elevated anti-CCP referred to rheumatology Pressure ulcer of buttock POA wound care consulted, continue supportive care.   Morbid Obesity:Patient's Body mass index is 54.89 kg/m. : Will benefit with PCP follow-up, weight loss  healthy lifestyle   DVT prophylaxis: eliquis Code Status:   Code Status: Full Code Family Communication: plan of care discussed with patient at bedside. Patient status is: will remains hospitalized needign at least 2 night for aki.  Level of care: Med-Surg   Dispo: The patient is from: home w/ husband            Anticipated disposition: TBD-PT OT consulted today. Mobility Assessment (last 72 hours)     Mobility Assessment     Row Name 05/21/22 1425 05/20/22 23:58:43         Does patient have an order for bedrest or is patient medically unstable Yes- Bedfast (Level 1) - Complete Yes- Bedfast (Level 1) - Complete                Objective: Vitals last 24 hrs: Vitals:   05/21/22 2140 05/22/22 0324 05/22/22 0500 05/22/22 0830  BP:  116/66  114/67  Pulse:  66  71  Resp:  18  19  Temp:  98.5 F (36.9 C)  97.6 F (36.4 C)  TempSrc:  Oral  Oral  SpO2: 99% 100%  90%  Weight:   (!) 154.3 kg   Height:       Weight change: 10 kg  Physical Examination: General exam: AAOX3, obese, pleasant HEENT:Oral mucosa moist, Ear/Nose WNL grossly, dentition normal. Respiratory system: bilaterally diminished BS, no use of accessory muscle Cardiovascular system: S1 & S2 +, regular rate. Gastrointestinal system: Abdomen soft, NT,ND,BS+ Nervous System:Alert, awake, moving extremities and grossly nonfocal Extremities: LE ankle edema + w/ ace wrap, lower extremities warm Skin: No rashes,no icterus. MSK: Normal muscle bulk,tone, power   Medications reviewed:  Scheduled Meds:  apixaban  5 mg Oral BID   atorvastatin  20 mg Oral Daily   buPROPion  150 mg Oral Daily   gabapentin  200 mg Oral TID   insulin aspart  0-15 Units Subcutaneous TID WC   iron polysaccharides  150 mg Oral Daily   [START ON 05/23/2022] levothyroxine  125 mcg Oral Q0600   multivitamin with minerals  1 tablet Oral Daily   pantoprazole  40 mg Oral Daily   propranolol  20 mg Oral TID   rOPINIRole  3 mg Oral QHS   sodium chloride flush  3 mL Intravenous Q12H   zinc oxide   Topical BID  Continuous Infusions:  lactated ringers 75 mL/hr at 05/22/22 0217    Diet Order             Diet regular Room service appropriate? Yes; Fluid consistency: Thin  Diet effective now                   Intake/Output Summary (Last 24 hours) at 05/22/2022 0940 Last data filed at 05/22/2022 0554 Gross per 24 hour  Intake 240 ml  Output 500 ml  Net -260 ml   Net IO Since Admission: -260 mL [05/22/22 0940]  Wt Readings from Last 3 Encounters:  05/22/22 (!) 154.3 kg  03/19/22 (!) 144 kg  07/29/21 (!) 146.2 kg     Unresulted Labs (From admission, onward)     Start     Ordered   05/22/22 6644  Basic metabolic panel  Daily at 5am,   R      05/21/22 1335  05/22/22 0500  CBC  Daily at 5am,   R      05/21/22 1335   05/22/22 0500  T3, free  Tomorrow morning,   R        05/21/22 1337   05/20/22 1742  Microalbumin / creatinine urine ratio  Once,   R        05/20/22 1742          Data Reviewed: I have personally reviewed following labs and imaging studies CBC: Recent Labs  Lab 05/20/22 1542 05/21/22 0432 05/22/22 0317  WBC 10.0 10.4 12.8*  NEUTROABS 7.4  --   --   HGB 9.0* 8.5* 8.5*  HCT 32.8* 30.8* 31.3*  MCV 77.4* 78.4* 78.1*  PLT 385 358 660   Basic Metabolic Panel: Recent Labs  Lab 05/20/22 1542 05/21/22 0432 05/22/22 0317  NA 141 140 143  K 4.8 4.5 4.3  CL 105 106 105  CO2 '27 29 31  '$ GLUCOSE 89 71 107*  BUN 50* 49* 41*  CREATININE 1.89* 1.90* 1.51*  CALCIUM 8.4* 8.2* 8.2*   Recent Labs    05/20/22 1542 05/22/22 0317  TSH 0.202*  --   FREET4  --  2.26*   Recent Results (from the past 240 hour(s))  Urine Culture     Status: None (Preliminary result)   Collection Time: 05/20/22  8:06 PM   Specimen: Urine, Random  Result Value Ref Range Status   Specimen Description   Final    URINE, RANDOM Performed at [facility], [address] [place], [place] [address]    Special Requests   Final    NONE Performed at [facility], [address], [place], [place] [address]    Culture   Final    CULTURE REINCUBATED FOR BETTER GROWTH Performed at [facility], [place] [address], [place], [place] [address]    Report  Status PENDING  Incomplete    Antimicrobials: Anti-infectives (From admission, onward)    None      Culture/Microbiology    Component Value Date/Time   SDES  05/20/2022 2006    URINE, RANDOM Performed at [facility], [address] Madelaine Bhat [place], [place] [address]    [facility]  05/20/2022 2006    NONE Performed at [facility], [address] [place], [place] [address]    CULT  05/20/2022 2006    CULTURE REINCUBATED FOR BETTER GROWTH Performed at [facility] [address] [place], [place] [address]    REPTSTATUS PENDING 05/20/2022 2006    Other culture-see note  Radiology Studies: US RENAL  Result Date: 05/20/2022 CLINICAL DATA:  Acute kidney injury EXAM: RENAL / URINARY TRACT ULTRASOUND COMPLETE COMPARISON:  04/11/2020 FINDINGS: Right Kidney: Renal measurements: 10.6 x 5.7 x 5.3 cm = volume: 169 mL. Echogenicity within normal limits. 14 x 10 x 10 mm upper pole simple cyst, unchanged, benign. No hydronephrosis. Left Kidney: Renal measurements: 10.1 x 5.4 x 4.8 cm = volume: 135 mL. Echogenicity within normal limits. No mass or hydronephrosis visualized. Bladder: Appears normal for degree of bladder distention. Other: None. IMPRESSION: 14 mm right upper pole simple cyst, unchanged, benign. No follow-up is recommended. No hydronephrosis. Electronically Signed   By: Julian Hy M.D.   On: 05/20/2022 21:41     LOS: 0 days   Antonieta Pert, MD [facility]  05/22/2022, 9:40 AM

## 2022-05-22 NOTE — Progress Notes (Signed)
Patient is alert and oriented x4. BS was 130 last night. Bilateral leg wounds cleansed and wrapped. Wrapped extra with coban because kerlix slides down off of patient's legs with movement. Legs have ulcers and scabs. Redness to legs seem to be located on her anterior legs/shin and not on her posterior legs. Redness does not appear to wrap around her legs. Groin area very red, moist and macerated. Put nystantin powder and destin on area. Phil Dopp has been in place under abdomen for 1 day. Denied pain.

## 2022-05-23 DIAGNOSIS — E662 Morbid (severe) obesity with alveolar hypoventilation: Secondary | ICD-10-CM | POA: Diagnosis present

## 2022-05-23 DIAGNOSIS — E1151 Type 2 diabetes mellitus with diabetic peripheral angiopathy without gangrene: Secondary | ICD-10-CM | POA: Diagnosis present

## 2022-05-23 DIAGNOSIS — L89322 Pressure ulcer of left buttock, stage 2: Secondary | ICD-10-CM | POA: Diagnosis present

## 2022-05-23 DIAGNOSIS — N179 Acute kidney failure, unspecified: Secondary | ICD-10-CM | POA: Diagnosis present

## 2022-05-23 DIAGNOSIS — J9622 Acute and chronic respiratory failure with hypercapnia: Secondary | ICD-10-CM | POA: Diagnosis not present

## 2022-05-23 DIAGNOSIS — B952 Enterococcus as the cause of diseases classified elsewhere: Secondary | ICD-10-CM | POA: Diagnosis present

## 2022-05-23 DIAGNOSIS — D509 Iron deficiency anemia, unspecified: Secondary | ICD-10-CM | POA: Diagnosis present

## 2022-05-23 DIAGNOSIS — G9341 Metabolic encephalopathy: Secondary | ICD-10-CM | POA: Diagnosis not present

## 2022-05-23 DIAGNOSIS — E1142 Type 2 diabetes mellitus with diabetic polyneuropathy: Secondary | ICD-10-CM | POA: Diagnosis present

## 2022-05-23 DIAGNOSIS — I5033 Acute on chronic diastolic (congestive) heart failure: Secondary | ICD-10-CM | POA: Diagnosis not present

## 2022-05-23 DIAGNOSIS — G4733 Obstructive sleep apnea (adult) (pediatric): Secondary | ICD-10-CM | POA: Diagnosis present

## 2022-05-23 DIAGNOSIS — E890 Postprocedural hypothyroidism: Secondary | ICD-10-CM | POA: Diagnosis present

## 2022-05-23 DIAGNOSIS — E1122 Type 2 diabetes mellitus with diabetic chronic kidney disease: Secondary | ICD-10-CM | POA: Diagnosis present

## 2022-05-23 DIAGNOSIS — E8729 Other acidosis: Secondary | ICD-10-CM | POA: Diagnosis not present

## 2022-05-23 DIAGNOSIS — M064 Inflammatory polyarthropathy: Secondary | ICD-10-CM | POA: Diagnosis present

## 2022-05-23 DIAGNOSIS — G2581 Restless legs syndrome: Secondary | ICD-10-CM | POA: Diagnosis present

## 2022-05-23 DIAGNOSIS — L89312 Pressure ulcer of right buttock, stage 2: Secondary | ICD-10-CM | POA: Diagnosis present

## 2022-05-23 DIAGNOSIS — N390 Urinary tract infection, site not specified: Secondary | ICD-10-CM | POA: Diagnosis present

## 2022-05-23 DIAGNOSIS — Z9981 Dependence on supplemental oxygen: Secondary | ICD-10-CM | POA: Diagnosis not present

## 2022-05-23 DIAGNOSIS — W19XXXA Unspecified fall, initial encounter: Secondary | ICD-10-CM | POA: Diagnosis present

## 2022-05-23 DIAGNOSIS — Z79899 Other long term (current) drug therapy: Secondary | ICD-10-CM | POA: Diagnosis not present

## 2022-05-23 DIAGNOSIS — I480 Paroxysmal atrial fibrillation: Secondary | ICD-10-CM | POA: Diagnosis present

## 2022-05-23 DIAGNOSIS — E861 Hypovolemia: Secondary | ICD-10-CM | POA: Diagnosis present

## 2022-05-23 DIAGNOSIS — J9621 Acute and chronic respiratory failure with hypoxia: Secondary | ICD-10-CM | POA: Diagnosis not present

## 2022-05-23 DIAGNOSIS — Z6841 Body Mass Index (BMI) 40.0 and over, adult: Secondary | ICD-10-CM | POA: Diagnosis not present

## 2022-05-23 LAB — BASIC METABOLIC PANEL
Anion gap: 5 (ref 5–15)
BUN: 34 mg/dL — ABNORMAL HIGH (ref 8–23)
CO2: 31 mmol/L (ref 22–32)
Calcium: 8.4 mg/dL — ABNORMAL LOW (ref 8.9–10.3)
Chloride: 105 mmol/L (ref 98–111)
Creatinine, Ser: 1.21 mg/dL — ABNORMAL HIGH (ref 0.44–1.00)
GFR, Estimated: 49 mL/min — ABNORMAL LOW (ref >60)
Glucose, Bld: 96 mg/dL (ref 70–99)
Potassium: 4.2 mmol/L (ref 3.5–5.1)
Sodium: 141 mmol/L (ref 135–145)

## 2022-05-23 LAB — CBC
HCT: 33.4 % — ABNORMAL LOW (ref 36.0–46.0)
Hemoglobin: 9 g/dL — ABNORMAL LOW (ref 12.0–15.0)
MCH: 21 pg — ABNORMAL LOW (ref 26.0–34.0)
MCHC: 26.9 g/dL — ABNORMAL LOW (ref 30.0–36.0)
MCV: 78 fL — ABNORMAL LOW (ref 80.0–100.0)
Platelets: 408 10*3/uL — ABNORMAL HIGH (ref 150–400)
RBC: 4.28 MIL/uL (ref 3.87–5.11)
RDW: 20.4 % — ABNORMAL HIGH (ref 11.5–15.5)
WBC: 11.7 10*3/uL — ABNORMAL HIGH (ref 4.0–10.5)
nRBC: 0.4 % — ABNORMAL HIGH (ref 0.0–0.2)

## 2022-05-23 LAB — URINE CULTURE: Culture: 30000 — AB

## 2022-05-23 LAB — GLUCOSE, CAPILLARY
Glucose-Capillary: 99 mg/dL (ref 70–99)
Glucose-Capillary: 102 mg/dL — ABNORMAL HIGH (ref 70–99)
Glucose-Capillary: 116 mg/dL — ABNORMAL HIGH (ref 70–99)
Glucose-Capillary: 118 mg/dL — ABNORMAL HIGH (ref 70–99)

## 2022-05-23 MED ORDER — TRAZODONE HCL 50 MG PO TABS
50.0000 mg | ORAL_TABLET | Freq: Every evening | ORAL | Status: DC | PRN
  Administered 2022-05-23: 50 mg via ORAL
  Filled 2022-05-23 (×2): qty 1

## 2022-05-23 MED ORDER — METOPROLOL TARTRATE 5 MG/5ML IV SOLN
5.0000 mg | INTRAVENOUS | Status: DC | PRN
  Filled 2022-05-23: qty 5

## 2022-05-23 MED ORDER — IPRATROPIUM-ALBUTEROL 0.5-2.5 (3) MG/3ML IN SOLN
3.0000 mL | RESPIRATORY_TRACT | Status: DC | PRN
  Administered 2022-05-25 – 2022-05-26 (×2): 3 mL via RESPIRATORY_TRACT
  Filled 2022-05-23 (×2): qty 3

## 2022-05-23 MED ORDER — SENNOSIDES-DOCUSATE SODIUM 8.6-50 MG PO TABS: 1.0000 | ORAL_TABLET | Freq: Every evening | ORAL | Status: DC | PRN

## 2022-05-23 MED ORDER — SODIUM CHLORIDE 0.9 % IV SOLN
200.0000 mg | INTRAVENOUS | Status: AC
  Administered 2022-05-23 – 2022-05-27 (×5): 200 mg via INTRAVENOUS
  Filled 2022-05-23 (×5): qty 200

## 2022-05-23 MED ORDER — ONDANSETRON HCL 4 MG/2ML IJ SOLN: 4.0000 mg | Freq: Four times a day (QID) | INTRAMUSCULAR | Status: DC | PRN

## 2022-05-23 MED ORDER — HYDRALAZINE HCL 20 MG/ML IJ SOLN: 10.0000 mg | INTRAMUSCULAR | Status: DC | PRN

## 2022-05-23 MED ORDER — GUAIFENESIN 100 MG/5ML PO LIQD: 5.0000 mL | ORAL | Status: DC | PRN

## 2022-05-23 MED ORDER — LACTATED RINGERS IV SOLN: INTRAVENOUS | Status: AC

## 2022-05-23 MED ORDER — FOSFOMYCIN TROMETHAMINE 3 G PO PACK
3.0000 g | PACK | Freq: Once | ORAL | Status: AC
  Administered 2022-05-23: 3 g via ORAL
  Filled 2022-05-23: qty 3

## 2022-05-23 NOTE — Plan of Care (Signed)

## 2022-05-23 NOTE — Progress Notes (Signed)
PROGRESS NOTE    Madison Mcbride  WGN:[id number] DOB: 09-27-1955 DOA: 05/20/2022 PCP: Rusty Aus, MD   Brief Narrative:  66 y.o.f w/ hx of  chronic hypoxic respiratory failure on 4 L [place] with ambulation, HFpEF (LVEF 45-50%), OSA on BiPAP, atrial fibrillation, T2DM,hypothyroidism presented with buttock sores. She is mostly sedentary at home, in recent days her bottom was having sores and patient is having burning and itching.  She also endorses fall 2 days PTA where her knees buckled-did not hit her head or had any injuries. Complaining of mild dysuria recently. In the ED vitals stable saturating 98% 2 L,Lab work showed elevated creatinine 1.8 and admitted for AKI. Also found to have iron def anemia and UTI. Ucx grew Enterococcus faecalis.    Assessment & Plan:  Principal Problem:   Acute kidney injury ([place]) Active Problems:   Microcytic anemia   Chronic hypoxic respiratory failure, on home oxygen therapy (HCC)   Congestive heart failure (CHF) (HCC)   Pressure ulcer of buttock   Dysuria   Chronic venous stasis dermatitis of both lower extremities   Inflammatory arthritis   OSA (obstructive sleep apnea)   Type 2 diabetes mellitus with diabetic neuropathy, without long-term current use of insulin (HCC)   Sinus bradycardia     Assessment and Plan: * Acute kidney injury (HCC) Baseline 0.9, admission Cr 1.9. Improving, Cont IVF. Monitor output. Hold nephrotoxic drugs.   Microcytic anemia Iron Deficiency Iron Def, Last C scope 2017, should follow up outpatient. No gross bleeding.  Will Give IV iron while here and then PO upon dc.    C-scope in 2017 showed multiple polyps which were resected  Chronic hypoxic respiratory failure, on home oxygen therapy ([place]) Cont Home O2. Supportive care. Was discharged on 4L [place] in Sept 2023.    Congestive heart failure (CHF) ([place]) History of CHF with EF of 45-50% on carvedilol and Lasix.  She is hypovolemic at this time. Cont Coreg, home  lasix on Hold for now.   Pressure ulcer of buttock - Wound care consult - Encouraged to frequently rotate position at home when on her recliner  UTI due to enterococcus faecalis.  Symptomatic with dysuria. Will order a dose of fosfomycin.    Chronic venous stasis dermatitis of both lower extremities Lower extremity edema that is nonpitting in nature with hyperpigmentation consistent with chronic venous stasis.  Patient states symptoms are improving.  No evidence of acute infection.  Patient would like to hold off on Unna boots at this time. Elevated legs whenever possible.   Inflammatory arthritis Per chart review, patient followed up with her PCP for concern of inflammatory arthritis.  Anti-CCP markedly elevated.  She was referred to rheumatology but has not followed up.  Given patient has had increasing falls, this would be prudent to address.  - Outpatient rheumatology follow-up  OSA (obstructive sleep apnea) - BiPAP at night  Sinus bradycardia Long-term history of chronic bradycardia per previous EKG review.  She is on both carvedilol (for heart failure) and propranolol (for tremors) that likely explains her bradycardia. Currently on Coreg.   Type 2 diabetes mellitus with diabetic neuropathy, without long-term current use of insulin (HCC) - Hold home semaglutide - SSI, moderate  Hx of P A fib On Coreg and Eliquis at home.     PT/OT= SNF    DVT prophylaxis: Eliquis Code Status: Full Family Communication:    Cont hospital stay due to AKI, Bradycardia and iron def anemia. PT/OT eval.  Body mass index is 54.89 kg/m.         Subjective: Feels ok, tells me her last c scope was maby 6 yrs ago and thinks she is due for another one. Denies any obvious signs of bleeding.    Examination:  General exam: Appears calm and comfortable  Respiratory system: Clear to auscultation. Respiratory effort normal. Cardiovascular system: S1 & S2 heard, RRR. No JVD, murmurs,  rubs, gallops or clicks. No pedal edema. Gastrointestinal system: Abdomen is nondistended, soft and nontender. No organomegaly or masses felt. Normal bowel sounds heard. Central nervous system: Alert and oriented. No focal neurological deficits. Extremities: Symmetric 5 x 5 power. Skin: No rashes, lesions or ulcers Psychiatry: Judgement and insight appear normal. Mood & affect appropriate.     Objective: Vitals:   05/22/22 2052 05/22/22 2355 05/23/22 0513 05/23/22 0743  BP:   125/71 112/63  Pulse: 70  64 62  Resp:   16 18  Temp:   98 F (36.7 C) 98.2 F (36.8 C)  TempSrc:      SpO2: 96% 93% 94% 94%  Weight:      Height:        Intake/Output Summary (Last 24 hours) at 05/23/2022 0847 Last data filed at 05/22/2022 1300 Gross per 24 hour  Intake 240 ml  Output 500 ml  Net -260 ml   Filed Weights   05/20/22 2012 05/22/22 0500  Weight: (!) 144.2 kg (!) 154.3 kg     Data Reviewed:   CBC: Recent Labs  Lab 05/20/22 1542 05/21/22 0432 05/22/22 0317 05/23/22 0627  WBC 10.0 10.4 12.8* 11.7*  NEUTROABS 7.4  --   --   --   HGB 9.0* 8.5* 8.5* 9.0*  HCT 32.8* 30.8* 31.3* 33.4*  MCV 77.4* 78.4* 78.1* 78.0*  PLT 385 358 390 924*   Basic Metabolic Panel: Recent Labs  Lab 05/20/22 1542 05/21/22 0432 05/22/22 0317 05/23/22 0627  NA 141 140 143 141  K 4.8 4.5 4.3 4.2  CL 105 106 105 105  CO2 '27 29 31 31  '$ GLUCOSE 89 71 107* 96  BUN 50* 49* 41* 34*  CREATININE 1.89* 1.90* 1.51* 1.21*  CALCIUM 8.4* 8.2* 8.2* 8.4*   GFR: Estimated Creatinine Clearance: 68.4 mL/min (A) (by C-G formula based on SCr of 1.21 mg/dL (H)). Liver Function Tests: No results for input(s): "AST", "ALT", "ALKPHOS", "BILITOT", "PROT", "ALBUMIN" in the last 168 hours. No results for input(s): "LIPASE", "AMYLASE" in the last 168 hours. No results for input(s): "AMMONIA" in the last 168 hours. Coagulation Profile: No results for input(s): "INR", "PROTIME" in the last 168 hours. Cardiac  Enzymes: No results for input(s): "CKTOTAL", "CKMB", "CKMBINDEX", "TROPONINI" in the last 168 hours. BNP (last 3 results) No results for input(s): "PROBNP" in the last 8760 hours. HbA1C: No results for input(s): "HGBA1C" in the last 72 hours. CBG: Recent Labs  Lab 05/21/22 2109 05/22/22 0827 05/22/22 1201 05/22/22 1654 05/23/22 0737  GLUCAP 130* 96 108* 112* 99   Lipid Profile: No results for input(s): "CHOL", "HDL", "LDLCALC", "TRIG", "CHOLHDL", "LDLDIRECT" in the last 72 hours. Thyroid Function Tests: Recent Labs    05/20/22 1542 05/22/22 0317  TSH 0.202*  --   FREET4  --  2.26*   Anemia Panel: Recent Labs    05/20/22 1542  FERRITIN 17  TIBC 388  IRON 21*   Sepsis Labs: No results for input(s): "PROCALCITON", "LATICACIDVEN" in the last 168 hours.  Recent Results (from the past 240 hour(s))  Urine  Culture     Status: Abnormal (Preliminary result)   Collection Time: 05/20/22  8:06 PM   Specimen: Urine, Random  Result Value Ref Range Status   Specimen Description   Final    URINE, RANDOM Performed at [facility], [address] [place], [place] [address]    Special Requests   Final    NONE Performed at [facility], [address] [place], [place] [address]    Culture (A)  Final    30,000 COLONIES/mL ENTEROCOCCUS FAECALIS SUSCEPTIBILITIES TO FOLLOW Performed at [facility], [place] [address], [place],  [address]    Report Status PENDING  Incomplete         Radiology Studies: No results found.      Scheduled Meds:  apixaban  5 mg Oral BID   atorvastatin  20 mg Oral Daily   buPROPion  150 mg Oral Daily   gabapentin  200 mg Oral TID   insulin aspart  0-15 Units Subcutaneous TID WC   iron polysaccharides  150 mg Oral Daily   levothyroxine  125 mcg Oral Q0600   multivitamin with minerals  1 tablet Oral Daily   pantoprazole  40 mg Oral Daily   propranolol  20 mg Oral TID   rOPINIRole  3 mg Oral QHS   sodium  chloride flush  3 mL Intravenous Q12H   zinc oxide   Topical BID   Continuous Infusions:  lactated ringers 30 mL/hr at 05/22/22 0940     LOS: 0 days   Time spent= 35 mins    Carmelle Bamberg Arsenio Loader, MD [facility]  If 7PM-7AM, please contact night-coverage  05/23/2022, 8:47 AM

## 2022-05-23 NOTE — NC FL2 (Signed)
[place] LEVEL OF CARE SCREENING TOOL     IDENTIFICATION  Patient Name: Madison Mcbride Birthdate: 21-Jun-1956 Sex: female Admission Date (Current Location): 05/20/2022  [facility] and [place] Number:  Engineering geologist and Address:  [facility], [address], [place], [place] [address]      Provider Number: [id number]  Attending Physician Name and Address:  Damita Lack, MD  Relative Name and Phone Number:  Malesha Suliman    Current Level of Care: Hospital Recommended Level of Care: [place] Prior Approval Number:    Date Approved/Denied:   PASRR Number: [id number]  Discharge Plan: SNF    Current Diagnoses: Patient Active Problem List   Diagnosis Date Noted   Acute kidney injury ([place]) 05/20/2022   Chronic hypoxic respiratory failure, on home oxygen therapy ([place]) 05/20/2022   Congestive heart failure (CHF) ([place]) 05/20/2022   Microcytic anemia 05/20/2022   Chronic venous stasis dermatitis of both lower extremities 05/20/2022   Pressure ulcer of buttock 05/20/2022   Inflammatory arthritis 05/20/2022   Sinus bradycardia 05/20/2022   Dysuria 05/20/2022   PAF (paroxysmal atrial fibrillation) ([place]) 03/08/2022   Acute on chronic respiratory failure with hypoxia and hypercapnia ([place]) 03/05/2022   Effusion of knee joint, left 03/22/2021   Acute on chronic systolic CHF (congestive heart failure) ([place]) 03/19/2021   Acute on chronic heart failure ([place]) 03/14/2021   Acute hypoxemic respiratory failure ([place]) 03/14/2021   Diabetes mellitus without complication ([place]) 30/86/5784   HLD (hyperlipidemia) 04/09/2020   Depression 04/09/2020   Elevated troponin 04/09/2020   OSA (obstructive sleep apnea) 04/09/2020   Peripheral polyneuropathy 02/29/2020   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   Lumbar radiculopathy, acute    Acute midline low back pain without sciatica    Type  2 diabetes mellitus with diabetic neuropathy, without long-term current use of insulin ([place]) 02/21/2020   Hypothyroidism 02/21/2020   Morbid obesity with BMI of 50.0-59.9, adult ([place]) 02/21/2020   CAP (community acquired pneumonia) 02/21/2020   Bilateral cellulitis of lower leg 02/21/2020   Acute respiratory failure with hypoxia and hypercapnia ([place]) 02/21/2020   Acute renal failure superimposed on stage 3a chronic kidney disease ([place]) 02/21/2020   Transaminitis 01/27/2020   Lymphedema of both lower extremities 08/21/2016   Benign essential hypertension 05/18/2016    Orientation RESPIRATION BLADDER Height & Weight     Self, Time, Situation, Place  O2 ([place] 2 liters) Incontinent, External catheter Weight: (!) 340 lb 1.6 oz (154.3 kg) Height:  '5\' 6"'$  (167.6 cm)  BEHAVIORAL SYMPTOMS/MOOD NEUROLOGICAL BOWEL NUTRITION STATUS      Continent Diet (see dc summary)  AMBULATORY STATUS COMMUNICATION OF NEEDS Skin   Total Care   Other (Comment) (open wound 11/9 r buttocks, left mid  diabetic ulcer venous statis ulcer pretibial right, left  8/25 left)                       Personal Care Assistance Level of Assistance  Bathing, Feeding, Dressing Bathing Assistance: Maximum assistance Feeding assistance: Limited assistance Dressing Assistance: Maximum assistance     Functional Limitations Info  Sight, Hearing, Speech Sight Info: Adequate Hearing Info: Adequate Speech Info: Adequate    SPECIAL CARE FACTORS FREQUENCY  PT (By licensed PT), OT (By licensed OT)     PT Frequency: 3x week OT Frequency: 3x week            Contractures Contractures Info: Not present  Additional Factors Info  Code Status, Allergies, Psychotropic, Insulin Sliding Scale Code Status Info: Full Allergies Info: NKA Psychotropic Info: buPROPion (WELLBUTRIN XL) 24 hr tablet 150 mg daily  gabapentin (NEURONTIN) capsule 200 mg daily Insulin Sliding Scale Info: insulin aspart (novoLOG) injection 0-15 Units        Current Medications (05/23/2022):  This is the current hospital active medication list Current Facility-Administered Medications  Medication Dose Route Frequency Provider Last Rate Last Admin   acetaminophen (TYLENOL) tablet 650 mg  650 mg Oral Q6H PRN Jose Persia, MD   650 mg at 05/22/22 1215   Or   acetaminophen (TYLENOL) suppository 650 mg  650 mg Rectal Q6H PRN Jose Persia, MD       ALPRAZolam Duanne Moron) tablet 0.25 mg  0.25 mg Oral TID PRN Jose Persia, MD   0.25 mg at 05/20/22 2157   apixaban (ELIQUIS) tablet 5 mg  5 mg Oral BID Jose Persia, MD   5 mg at 05/23/22 0277   atorvastatin (LIPITOR) tablet 20 mg  20 mg Oral Daily Jose Persia, MD   20 mg at 05/23/22 4128   buPROPion (WELLBUTRIN XL) 24 hr tablet 150 mg  150 mg Oral Daily Jose Persia, MD   150 mg at 05/23/22 7867   fosfomycin (MONUROL) packet 3 g  3 g Oral Once Amin, Ankit Chirag, MD       gabapentin (NEURONTIN) capsule 200 mg  200 mg Oral TID Jose Persia, MD   200 mg at 05/23/22 0924   guaiFENesin (ROBITUSSIN) 100 MG/5ML liquid 5 mL  5 mL Oral Q4H PRN Amin, Ankit Chirag, MD       hydrALAZINE (APRESOLINE) injection 10 mg  10 mg Intravenous Q4H PRN Amin, Ankit Chirag, MD       insulin aspart (novoLOG) injection 0-15 Units  0-15 Units Subcutaneous TID WC Jose Persia, MD       ipratropium-albuterol (DUONEB) 0.5-2.5 (3) MG/3ML nebulizer solution 3 mL  3 mL Nebulization Q4H PRN Amin, Ankit Chirag, MD       iron sucrose (VENOFER) 200 mg in sodium chloride 0.9 % 100 mL IVPB  200 mg Intravenous Q24H Amin, Ankit Chirag, MD       lactated ringers infusion   Intravenous Continuous Damita Lack, MD 75 mL/hr at 05/23/22 0927 Rate Change at 05/23/22 6720   levothyroxine (SYNTHROID) tablet 125 mcg  125 mcg Oral Q0600 Antonieta Pert, MD   125 mcg at 05/23/22 0508   metoprolol tartrate (LOPRESSOR) injection 5 mg  5 mg Intravenous Q4H PRN Amin, Ankit Chirag, MD       multivitamin with minerals tablet 1 tablet  1  tablet Oral Daily Jose Persia, MD   1 tablet at 05/23/22 0923   ondansetron (ZOFRAN) injection 4 mg  4 mg Intravenous Q6H PRN Amin, Ankit Chirag, MD       pantoprazole (PROTONIX) EC tablet 40 mg  40 mg Oral Daily Jose Persia, MD   40 mg at 05/23/22 0924   polyethylene glycol (MIRALAX / GLYCOLAX) packet 17 g  17 g Oral Daily PRN Jose Persia, MD       propranolol (INDERAL) tablet 20 mg  20 mg Oral TID Antonieta Pert, MD   20 mg at 05/23/22 0924   rOPINIRole (REQUIP) tablet 3 mg  3 mg Oral QHS Jose Persia, MD   3 mg at 05/22/22 2158   senna-docusate (Senokot-S) tablet 1 tablet  1 tablet Oral QHS PRN Damita Lack, MD  sodium chloride flush (NS) 0.9 % injection 3 mL  3 mL Intravenous Q12H Jose Persia, MD   3 mL at 05/23/22 0923   traZODone (DESYREL) tablet 50 mg  50 mg Oral QHS PRN Amin, Ankit Chirag, MD       zinc oxide 20 % ointment   Topical BID Antonieta Pert, MD   Given at 05/23/22 4739     Discharge Medications: Please see discharge summary for a list of discharge medications.  Relevant Imaging Results:  Relevant Lab Results:   Additional Information    Tosha Belgarde B Lamesha Tibbits, LCSWA

## 2022-05-23 NOTE — TOC Initial Note (Signed)
Transition of Care [facility]) - Initial/Assessment Note    Patient Details  Name: Madison Mcbride MRN: [id number] Date of Birth: 1955/11/21  [facility]) CM/SW Contact:    Loreta [address], [place] Phone Number: 05/23/2022, 10:30 AM  Clinical Narrative:                  CSW spoke with pt's spouse via phone. Pt's spouse states pt was recently at [facility] for STR, he states he felt as though pt was dc too early. Pt's spouse states pt is restricted to a recliner at home, though it lifts he has trouble getting her out of the chair and recently was unable to lift her all together. Pt's spouse states pt has bedsores. CSW asked pt's spouse if he felt pt returning home was safe, he stated he didn't, he stated PT/OT was coming to the home but pt would cancel the appointments due to being in pain. CSW explained that pt may be in copay status as pt was at a SNF in September. CSW explained that different SNFs have their own rules on how the copay would be billed. Pt's spouse agreeable to pt being faxed out. CSW explained Medicare.gov site for ratings information. Pt's spouse states he will be arriving at the hospital this morning and will request an update from MD at that time.    Expected Discharge Plan: Skilled Nursing Facility Barriers to Discharge: Continued Medical Work up, Ship broker   Patient Goals and CMS Choice Patient states their goals for this hospitalization and ongoing recovery are:: agrees with returning home with home health CMS Medicare.gov Compare Post Acute Care list provided to:: Patient Choice offered to / list presented to : Patient  Expected Discharge Plan and Services Expected Discharge Plan: [place] In-house Referral: Clinical Social Work Discharge Planning Services: CM Consult Post Acute Care Choice: [place] arrangements for the past 2 months: Single Family Home                 DME Arranged: N/A DME Agency: NA       HH  Arranged: RN, PT, OT, Speech Therapy HH Agency: [place] Date [place]: 05/20/22 Time [place]: 67 Representative spoke with at [place]: [place]  Prior Living Arrangements/Services Living arrangements for the past 2 months: [place] with:: Spouse Patient language and need for interpreter reviewed:: Yes Do you feel safe going back to the place where you live?: Yes      Need for Family Participation in Patient Care: Yes (Comment) Care giver support system in place?: Yes (comment) Current home services: DME (walker, wheelchair, oxygen, trilogy) Criminal Activity/Legal Involvement Pertinent to Current Situation/Hospitalization: No - Comment as needed  Activities of Daily Living Home Assistive Devices/Equipment: Oxygen ADL Screening (condition at time of admission) Patient's cognitive ability adequate to safely complete daily activities?: Yes Is the patient deaf or have difficulty hearing?: No Does the patient have difficulty seeing, even when wearing glasses/contacts?: No Does the patient have difficulty concentrating, remembering, or making decisions?: No Patient able to express need for assistance with ADLs?: Yes Does the patient have difficulty dressing or bathing?: No Independently performs ADLs?: Yes (appropriate for developmental age) Does the patient have difficulty walking or climbing stairs?: Yes Weakness of Legs: Both Weakness of Arms/Hands: None  Permission Sought/Granted Permission sought to share information with : Case Manager, Customer service manager, Family Supports    Share Information with NAME: Vondell Sowell  Permission granted to share info w AGENCY: SNF  Permission granted to share info w Relationship: spouse  Permission granted to share info w Contact Information: [phone]  Emotional Assessment Appearance:: Appears stated age Attitude/Demeanor/Rapport: Engaged Affect (typically observed):  Accepting Orientation: : Oriented to Self, Oriented to Place, Oriented to  Time, Oriented to Situation Alcohol / Substance Use: Not Applicable Psych Involvement: No (comment)  Admission diagnosis:  AKI (acute kidney injury) ([place]) [N17.9] Acute kidney injury ([place]) [N17.9] Pressure injury of skin of buttock, unspecified injury stage, unspecified laterality [L89.309] Patient Active Problem List   Diagnosis Date Noted   Acute kidney injury ([place]) 05/20/2022   Chronic hypoxic respiratory failure, on home oxygen therapy ([place]) 05/20/2022   Congestive heart failure (CHF) ([place]) 05/20/2022   Microcytic anemia 05/20/2022   Chronic venous stasis dermatitis of both lower extremities 05/20/2022   Pressure ulcer of buttock 05/20/2022   Inflammatory arthritis 05/20/2022   Sinus bradycardia 05/20/2022   Dysuria 05/20/2022   PAF (paroxysmal atrial fibrillation) ([place]) 03/08/2022   Acute on chronic respiratory failure with hypoxia and hypercapnia (HCC) 03/05/2022   Effusion of knee joint, left 03/22/2021   Acute on chronic systolic CHF (congestive heart failure) ([place]) 03/19/2021   Acute on chronic heart failure ([place]) 03/14/2021   Acute hypoxemic respiratory failure ([place]) 03/14/2021   Diabetes mellitus without complication ([place]) 18/84/1660   HLD (hyperlipidemia) 04/09/2020   Depression 04/09/2020   Elevated troponin 04/09/2020   OSA (obstructive sleep apnea) 04/09/2020   Peripheral polyneuropathy 02/29/2020   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   Lumbar radiculopathy, acute    Acute midline low back pain without sciatica    Type 2 diabetes mellitus with diabetic neuropathy, without long-term current use of insulin ([place]) 02/21/2020   Hypothyroidism 02/21/2020   Morbid obesity with BMI of 50.0-59.9, adult ([place]) 02/21/2020   CAP (community acquired pneumonia) 02/21/2020   Bilateral cellulitis of lower leg 02/21/2020   Acute respiratory failure with hypoxia and  hypercapnia ([place]) 02/21/2020   Acute renal failure superimposed on stage 3a chronic kidney disease ([place]) 02/21/2020   Transaminitis 01/27/2020   Lymphedema of both lower extremities 08/21/2016   Benign essential hypertension 05/18/2016   PCP:  Rusty Aus, MD Pharmacy:   [facility] [place] [facility] [address] [place] [address] Phone: [phone] Fax: [phone]     Social Determinants of Health (SDOH) Interventions    Readmission Risk Interventions    03/07/2022    4:12 PM  Readmission Risk Prevention Plan  Transportation Screening Complete  PCP or Specialist Appt within 5-7 Days Complete  Home Care Screening Complete  Medication Review (RN CM) Complete

## 2022-05-23 NOTE — NC FL2 (Signed)
[place] LEVEL OF CARE SCREENING TOOL     IDENTIFICATION  Patient Name: Madison Mcbride Birthdate: 1955-10-31 Sex: female Admission Date (Current Location): 05/20/2022  [facility] and [place] Number:  Engineering geologist and Address:         Provider Number: [id number]  Attending Physician Name and Address:  Damita Lack, MD  Relative Name and Phone Number:  Katoya Amato    Current Level of Care: Hospital Recommended Level of Care: [place] Prior Approval Number:    Date Approved/Denied:   PASRR Number: [id number]  Discharge Plan: SNF    Current Diagnoses: Patient Active Problem List   Diagnosis Date Noted   Acute kidney injury ([place]) 05/20/2022   Chronic hypoxic respiratory failure, on home oxygen therapy ([place]) 05/20/2022   Congestive heart failure (CHF) ([place]) 05/20/2022   Microcytic anemia 05/20/2022   Chronic venous stasis dermatitis of both lower extremities 05/20/2022   Pressure ulcer of buttock 05/20/2022   Inflammatory arthritis 05/20/2022   Sinus bradycardia 05/20/2022   Dysuria 05/20/2022   PAF (paroxysmal atrial fibrillation) ([place]) 03/08/2022   Acute on chronic respiratory failure with hypoxia and hypercapnia ([place]) 03/05/2022   Effusion of knee joint, left 03/22/2021   Acute on chronic systolic CHF (congestive heart failure) ([place]) 03/19/2021   Acute on chronic heart failure ([place]) 03/14/2021   Acute hypoxemic respiratory failure ([place]) 03/14/2021   Diabetes mellitus without complication ([place]) 84/16/6063   HLD (hyperlipidemia) 04/09/2020   Depression 04/09/2020   Elevated troponin 04/09/2020   OSA (obstructive sleep apnea) 04/09/2020   Peripheral polyneuropathy 02/29/2020   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   Lumbar radiculopathy, acute    Acute midline low back pain without sciatica    Type 2 diabetes mellitus with diabetic neuropathy, without long-term current use  of insulin ([place]) 02/21/2020   Hypothyroidism 02/21/2020   Morbid obesity with BMI of 50.0-59.9, adult ([place]) 02/21/2020   CAP (community acquired pneumonia) 02/21/2020   Bilateral cellulitis of lower leg 02/21/2020   Acute respiratory failure with hypoxia and hypercapnia ([place]) 02/21/2020   Acute renal failure superimposed on stage 3a chronic kidney disease ([place]) 02/21/2020   Transaminitis 01/27/2020   Lymphedema of both lower extremities 08/21/2016   Benign essential hypertension 05/18/2016    Orientation RESPIRATION BLADDER Height & Weight     Self, Time, Situation, Place  O2 ([place] 2 liters) Incontinent, External catheter Weight: (!) 340 lb 1.6 oz (154.3 kg) Height:  '5\' 6"'$  (167.6 cm)  BEHAVIORAL SYMPTOMS/MOOD NEUROLOGICAL BOWEL NUTRITION STATUS      Continent Diet (see dc summary)  AMBULATORY STATUS COMMUNICATION OF NEEDS Skin   Total Care   Other (Comment) (open wound 11/9 r buttocks, left mid  diabetic ulcer venous statis ulcer pretibial right, left  8/25 left)                       Personal Care Assistance Level of Assistance  Bathing, Feeding, Dressing Bathing Assistance: Maximum assistance Feeding assistance: Limited assistance Dressing Assistance: Maximum assistance     Functional Limitations Info  Sight, Hearing, Speech Sight Info: Adequate Hearing Info: Adequate Speech Info: Adequate    SPECIAL CARE FACTORS FREQUENCY  PT (By licensed PT), OT (By licensed OT)     PT Frequency: 3x week OT Frequency: 3x week            Contractures Contractures Info: Not present    Additional Factors Info  Code Status, Allergies, Psychotropic,  Insulin Sliding Scale Code Status Info: Full Allergies Info: NKA Psychotropic Info: buPROPion (WELLBUTRIN XL) 24 hr tablet 150 mg daily  gabapentin (NEURONTIN) capsule 200 mg daily Insulin Sliding Scale Info: insulin aspart (novoLOG) injection 0-15 Units       Current Medications (05/23/2022):  This is the current hospital  active medication list Current Facility-Administered Medications  Medication Dose Route Frequency Provider Last Rate Last Admin   acetaminophen (TYLENOL) tablet 650 mg  650 mg Oral Q6H PRN Jose Persia, MD   650 mg at 05/22/22 1215   Or   acetaminophen (TYLENOL) suppository 650 mg  650 mg Rectal Q6H PRN Jose Persia, MD       ALPRAZolam Duanne Moron) tablet 0.25 mg  0.25 mg Oral TID PRN Jose Persia, MD   0.25 mg at 05/20/22 2157   apixaban (ELIQUIS) tablet 5 mg  5 mg Oral BID Jose Persia, MD   5 mg at 05/23/22 7858   atorvastatin (LIPITOR) tablet 20 mg  20 mg Oral Daily Jose Persia, MD   20 mg at 05/23/22 8502   buPROPion (WELLBUTRIN XL) 24 hr tablet 150 mg  150 mg Oral Daily Jose Persia, MD   150 mg at 05/23/22 7741   fosfomycin (MONUROL) packet 3 g  3 g Oral Once Amin, Ankit Chirag, MD       gabapentin (NEURONTIN) capsule 200 mg  200 mg Oral TID Jose Persia, MD   200 mg at 05/23/22 0924   guaiFENesin (ROBITUSSIN) 100 MG/5ML liquid 5 mL  5 mL Oral Q4H PRN Amin, Ankit Chirag, MD       hydrALAZINE (APRESOLINE) injection 10 mg  10 mg Intravenous Q4H PRN Amin, Ankit Chirag, MD       insulin aspart (novoLOG) injection 0-15 Units  0-15 Units Subcutaneous TID WC Jose Persia, MD       ipratropium-albuterol (DUONEB) 0.5-2.5 (3) MG/3ML nebulizer solution 3 mL  3 mL Nebulization Q4H PRN Amin, Ankit Chirag, MD       iron sucrose (VENOFER) 200 mg in sodium chloride 0.9 % 100 mL IVPB  200 mg Intravenous Q24H Amin, Ankit Chirag, MD       lactated ringers infusion   Intravenous Continuous Damita Lack, MD 75 mL/hr at 05/23/22 0927 Rate Change at 05/23/22 2878   levothyroxine (SYNTHROID) tablet 125 mcg  125 mcg Oral Q0600 Antonieta Pert, MD   125 mcg at 05/23/22 0508   metoprolol tartrate (LOPRESSOR) injection 5 mg  5 mg Intravenous Q4H PRN Amin, Ankit Chirag, MD       multivitamin with minerals tablet 1 tablet  1 tablet Oral Daily Jose Persia, MD   1 tablet at 05/23/22 0923    ondansetron (ZOFRAN) injection 4 mg  4 mg Intravenous Q6H PRN Amin, Ankit Chirag, MD       pantoprazole (PROTONIX) EC tablet 40 mg  40 mg Oral Daily Jose Persia, MD   40 mg at 05/23/22 0924   polyethylene glycol (MIRALAX / GLYCOLAX) packet 17 g  17 g Oral Daily PRN Jose Persia, MD       propranolol (INDERAL) tablet 20 mg  20 mg Oral TID Antonieta Pert, MD   20 mg at 05/23/22 0924   rOPINIRole (REQUIP) tablet 3 mg  3 mg Oral QHS Jose Persia, MD   3 mg at 05/22/22 2158   senna-docusate (Senokot-S) tablet 1 tablet  1 tablet Oral QHS PRN Amin, Ankit Chirag, MD       sodium chloride flush (NS) 0.9 % injection  3 mL  3 mL Intravenous Q12H Jose Persia, MD   3 mL at 05/23/22 0923   traZODone (DESYREL) tablet 50 mg  50 mg Oral QHS PRN Amin, Ankit Chirag, MD       zinc oxide 20 % ointment   Topical BID Antonieta Pert, MD   Given at 05/23/22 3391     Discharge Medications: Please see discharge summary for a list of discharge medications.  Relevant Imaging Results:  Relevant Lab Results:   Additional Information    Tirza Senteno B Adalaide Jaskolski, LCSWA

## 2022-05-24 DIAGNOSIS — N179 Acute kidney failure, unspecified: Secondary | ICD-10-CM | POA: Diagnosis not present

## 2022-05-24 LAB — BASIC METABOLIC PANEL
Anion gap: 5 (ref 5–15)
BUN: 31 mg/dL — ABNORMAL HIGH (ref 8–23)
CO2: 30 mmol/L (ref 22–32)
Calcium: 8.6 mg/dL — ABNORMAL LOW (ref 8.9–10.3)
Chloride: 107 mmol/L (ref 98–111)
Creatinine, Ser: 1.1 mg/dL — ABNORMAL HIGH (ref 0.44–1.00)
GFR, Estimated: 55 mL/min — ABNORMAL LOW (ref >60)
Glucose, Bld: 93 mg/dL (ref 70–99)
Potassium: 4.5 mmol/L (ref 3.5–5.1)
Sodium: 142 mmol/L (ref 135–145)

## 2022-05-24 LAB — CBC
HCT: 35 % — ABNORMAL LOW (ref 36.0–46.0)
Hemoglobin: 9.4 g/dL — ABNORMAL LOW (ref 12.0–15.0)
MCH: 21.4 pg — ABNORMAL LOW (ref 26.0–34.0)
MCHC: 26.9 g/dL — ABNORMAL LOW (ref 30.0–36.0)
MCV: 79.5 fL — ABNORMAL LOW (ref 80.0–100.0)
Platelets: 437 10*3/uL — ABNORMAL HIGH (ref 150–400)
RBC: 4.4 MIL/uL (ref 3.87–5.11)
RDW: 20.5 % — ABNORMAL HIGH (ref 11.5–15.5)
WBC: 13.4 10*3/uL — ABNORMAL HIGH (ref 4.0–10.5)
nRBC: 0.7 % — ABNORMAL HIGH (ref 0.0–0.2)

## 2022-05-24 LAB — T3, FREE: T3, Free: 1.8 pg/mL — ABNORMAL LOW (ref 2.0–4.4)

## 2022-05-24 LAB — GLUCOSE, CAPILLARY
Glucose-Capillary: 92 mg/dL (ref 70–99)
Glucose-Capillary: 97 mg/dL (ref 70–99)
Glucose-Capillary: 84 mg/dL (ref 70–99)
Glucose-Capillary: 102 mg/dL — ABNORMAL HIGH (ref 70–99)

## 2022-05-24 LAB — MAGNESIUM: Magnesium: 2.4 mg/dL (ref 1.7–2.4)

## 2022-05-24 NOTE — TOC Progression Note (Signed)
Transition of Care [facility]) - Progression Note    Patient Details  Name: Madison Mcbride MRN: [id number] Date of Birth: May 27, 1956  Transition of Care [facility]) CM/SW [place], LCSW Phone Number: 05/24/2022, 9:11 AM  Clinical Narrative:  No bed offers this morning.   Expected Discharge Plan: [place] Barriers to Discharge: Continued Medical Work up, Ship broker  Expected Discharge Plan and Services Expected Discharge Plan: [place] In-house Referral: Clinical Social Work Discharge Planning Services: CM Consult Post Acute Care Choice: [place] arrangements for the past 2 months: Single Family Home                 DME Arranged: N/A DME Agency: NA       HH Arranged: RN, PT, OT, Speech Therapy HH Agency: [facility] Date [place]: 05/20/22 Time [place]: 1619 Representative spoke with at [place]: [place]   Social Determinants of Health ([place]) Interventions    Readmission Risk Interventions    03/07/2022    4:12 PM  Readmission Risk Prevention Plan  Transportation Screening Complete  PCP or Specialist Appt within 5-7 Days Complete  Home Care Screening Complete  Medication Review (RN CM) Complete

## 2022-05-24 NOTE — Progress Notes (Signed)
Placed pt on her home Trilogy NIV with 4L O2 bleeding She appears comfortable on it. O2 sat currently 95%

## 2022-05-24 NOTE — Progress Notes (Signed)
PROGRESS NOTE    Madison Mcbride  JSH:[id number] DOB: 1955/08/15 DOA: 05/20/2022 PCP: Rusty Aus, MD   Brief Narrative:  66 y.o.f w/ hx of  chronic hypoxic respiratory failure on 4 L [place] with ambulation, HFpEF (LVEF 45-50%), OSA on BiPAP, atrial fibrillation, T2DM,hypothyroidism presented with buttock sores. She is mostly sedentary at home, in recent days her bottom was having sores and patient is having burning and itching.  She also endorses fall 2 days PTA where her knees buckled-did not hit her head or had any injuries. Complaining of mild dysuria recently. In the ED vitals stable saturating 98% 2 L,Lab work showed elevated creatinine 1.8 and admitted for AKI. Also found to have iron def anemia and UTI. Ucx grew Enterococcus faecalis.    Assessment & Plan:  Principal Problem:   Acute kidney injury ([place]) Active Problems:   Microcytic anemia   Chronic hypoxic respiratory failure, on home oxygen therapy (HCC)   Congestive heart failure (CHF) (HCC)   Pressure ulcer of buttock   Dysuria   Chronic venous stasis dermatitis of both lower extremities   Inflammatory arthritis   OSA (obstructive sleep apnea)   Type 2 diabetes mellitus with diabetic neuropathy, without long-term current use of insulin (HCC)   Sinus bradycardia     Assessment and Plan: * Acute kidney injury (HCC) Baseline 0.9, admission Cr 1.9. Improving, Cr today 1.1  Microcytic anemia Iron Deficiency Iron Def, Last C scope 2017, should follow up outpatient. No gross bleeding.  IV Iron while there then swtich to PO on dc.    C-scope in 2017 showed multiple polyps which were resected  Chronic hypoxic respiratory failure, on home oxygen therapy ([place]) Cont Home O2. Supportive care. Was discharged on 4L [place] in Sept 2023.    Congestive heart failure (CHF) ([place]) History of CHF with EF of 45-50% on carvedilol and Lasix.  She is hypovolemic at this time. Cont Propranolol, home Coreg and lasix on Hold for now.    Pressure ulcer of buttock - Encouraged to frequently rotate position at home when on her recliner  UTI due to enterococcus faecalis.  Symptomatic with dysuria. S/p One dose of fosfomycin 11/11   Chronic venous stasis dermatitis of both lower extremities Lower extremity edema that is nonpitting in nature with hyperpigmentation consistent with chronic venous stasis.  Patient states symptoms are improving.  No evidence of acute infection.  Patient would like to hold off on Unna boots at this time. Elevated legs whenever possible.   Inflammatory arthritis Per chart review, patient followed up with her PCP for concern of inflammatory arthritis.  Anti-CCP markedly elevated.  She was referred to rheumatology but has not followed up.  Given patient has had increasing falls, this would be prudent to address.  - Outpatient rheumatology follow-up  OSA (obstructive sleep apnea) - BiPAP at night  Sinus bradycardia Long-term history of chronic bradycardia per previous EKG review.  She is on both carvedilol (for heart failure) and propranolol (for tremors) that likely explains her bradycardia. Currently on Propranolol.   Type 2 diabetes mellitus with diabetic neuropathy, without long-term current use of insulin (HCC) Peripheral Neuropathy - Hold home semaglutide - SSI, moderate. Cont Gabapentin.   Hx of P A fib On Coreg and Eliquis at home.     PT/OT= SNF    DVT prophylaxis: Eliquis Code Status: Full Family Communication:    Medically doing better, will await for placement.   Body mass index is 54.89 kg/m.  Examination:  No complaints.   Physical Exam Constitutional: Not in acute distress. Obese.  Respiratory: Clear to auscultation bilaterally Cardiovascular: Normal sinus rhythm, no rubs Abdomen: Nontender nondistended good bowel sounds Musculoskeletal: No edema noted Skin: No rashes seen Neurologic: CN 2-12 grossly intact.  And nonfocal Psychiatric: Normal  judgment and insight. Alert and oriented x 3. Normal mood.    Objective: Vitals:   05/23/22 1707 05/23/22 1953 05/24/22 0110 05/24/22 0744  BP: (!) 124/58 126/64  117/62  Pulse: 63 62  63  Resp: '18 17  20  '$ Temp: 98.3 F (36.8 C) 98.2 F (36.8 C)  (!) 97.4 F (36.3 C)  TempSrc: Oral     SpO2: 94% 93% 95% 96%  Weight:      Height:        Intake/Output Summary (Last 24 hours) at 05/24/2022 0102 Last data filed at 05/24/2022 0600 Gross per 24 hour  Intake 2371.25 ml  Output 600 ml  Net 1771.25 ml   Filed Weights   05/20/22 2012 05/22/22 0500  Weight: (!) 144.2 kg (!) 154.3 kg     Data Reviewed:   CBC: Recent Labs  Lab 05/20/22 1542 05/21/22 0432 05/22/22 0317 05/23/22 0627 05/24/22 0549  WBC 10.0 10.4 12.8* 11.7* 13.4*  NEUTROABS 7.4  --   --   --   --   HGB 9.0* 8.5* 8.5* 9.0* 9.4*  HCT 32.8* 30.8* 31.3* 33.4* 35.0*  MCV 77.4* 78.4* 78.1* 78.0* 79.5*  PLT 385 358 390 408* 725*   Basic Metabolic Panel: Recent Labs  Lab 05/20/22 1542 05/21/22 0432 05/22/22 0317 05/23/22 0627 05/24/22 0549  NA 141 140 143 141 142  K 4.8 4.5 4.3 4.2 4.5  CL 105 106 105 105 107  CO2 '27 29 31 31 30  '$ GLUCOSE 89 71 107* 96 93  BUN 50* 49* 41* 34* 31*  CREATININE 1.89* 1.90* 1.51* 1.21* 1.10*  CALCIUM 8.4* 8.2* 8.2* 8.4* 8.6*  MG  --   --   --   --  2.4   GFR: Estimated Creatinine Clearance: 75.2 mL/min (A) (by C-G formula based on SCr of 1.1 mg/dL (H)). Liver Function Tests: No results for input(s): "AST", "ALT", "ALKPHOS", "BILITOT", "PROT", "ALBUMIN" in the last 168 hours. No results for input(s): "LIPASE", "AMYLASE" in the last 168 hours. No results for input(s): "AMMONIA" in the last 168 hours. Coagulation Profile: No results for input(s): "INR", "PROTIME" in the last 168 hours. Cardiac Enzymes: No results for input(s): "CKTOTAL", "CKMB", "CKMBINDEX", "TROPONINI" in the last 168 hours. BNP (last 3 results) No results for input(s): "PROBNP" in the last 8760  hours. HbA1C: No results for input(s): "HGBA1C" in the last 72 hours. CBG: Recent Labs  Lab 05/22/22 1654 05/23/22 0737 05/23/22 1248 05/23/22 1703 05/23/22 2101  GLUCAP 112* 99 102* 116* 118*   Lipid Profile: No results for input(s): "CHOL", "HDL", "LDLCALC", "TRIG", "CHOLHDL", "LDLDIRECT" in the last 72 hours. Thyroid Function Tests: Recent Labs    05/22/22 0317  FREET4 2.26*   Anemia Panel: No results for input(s): "VITAMINB12", "FOLATE", "FERRITIN", "TIBC", "IRON", "RETICCTPCT" in the last 72 hours.  Sepsis Labs: No results for input(s): "PROCALCITON", "LATICACIDVEN" in the last 168 hours.  Recent Results (from the past 240 hour(s))  Urine Culture     Status: Abnormal   Collection Time: 05/20/22  8:06 PM   Specimen: Urine, Random  Result Value Ref Range Status   Specimen Description   Final    URINE, RANDOM Performed at [facility]  Lab, [place], [place] [address]    Special Requests   Final    NONE Performed at [facility], [place], [place] [address]    Culture 30,000 COLONIES/mL ENTEROCOCCUS FAECALIS (A)  Final   Report Status 05/23/2022 FINAL  Final   Organism ID, Bacteria ENTEROCOCCUS FAECALIS (A)  Final      Susceptibility   Enterococcus faecalis - MIC*    AMPICILLIN <=2 SENSITIVE Sensitive     NITROFURANTOIN <=16 SENSITIVE Sensitive     VANCOMYCIN 1 SENSITIVE Sensitive     * 30,000 COLONIES/mL ENTEROCOCCUS FAECALIS         Radiology Studies: No results found.      Scheduled Meds:  apixaban  5 mg Oral BID   atorvastatin  20 mg Oral Daily   buPROPion  150 mg Oral Daily   gabapentin  200 mg Oral TID   insulin aspart  0-15 Units Subcutaneous TID WC   levothyroxine  125 mcg Oral Q0600   multivitamin with minerals  1 tablet Oral Daily   pantoprazole  40 mg Oral Daily   propranolol  20 mg Oral TID   rOPINIRole  3 mg Oral QHS   sodium chloride flush  3 mL Intravenous Q12H   zinc oxide    Topical BID   Continuous Infusions:  iron sucrose 200 mg (05/23/22 1044)   lactated ringers 75 mL/hr at 05/23/22 0927     LOS: 1 day   Time spent= 35 mins    Masayuki Sakai Arsenio Loader, MD [facility]  If 7PM-7AM, please contact night-coverage  05/24/2022, 8:22 AM

## 2022-05-25 DIAGNOSIS — N179 Acute kidney failure, unspecified: Secondary | ICD-10-CM | POA: Diagnosis not present

## 2022-05-25 LAB — CBC
HCT: 36.8 % (ref 36.0–46.0)
Hemoglobin: 9.7 g/dL — ABNORMAL LOW (ref 12.0–15.0)
MCH: 21 pg — ABNORMAL LOW (ref 26.0–34.0)
MCHC: 26.4 g/dL — ABNORMAL LOW (ref 30.0–36.0)
MCV: 79.7 fL — ABNORMAL LOW (ref 80.0–100.0)
Platelets: 441 10*3/uL — ABNORMAL HIGH (ref 150–400)
RBC: 4.62 MIL/uL (ref 3.87–5.11)
RDW: 21 % — ABNORMAL HIGH (ref 11.5–15.5)
WBC: 12.1 10*3/uL — ABNORMAL HIGH (ref 4.0–10.5)
nRBC: 0.8 % — ABNORMAL HIGH (ref 0.0–0.2)

## 2022-05-25 LAB — GLUCOSE, CAPILLARY
Glucose-Capillary: 72 mg/dL (ref 70–99)
Glucose-Capillary: 100 mg/dL — ABNORMAL HIGH (ref 70–99)

## 2022-05-25 LAB — BASIC METABOLIC PANEL
Anion gap: 6 (ref 5–15)
BUN: 33 mg/dL — ABNORMAL HIGH (ref 8–23)
CO2: 30 mmol/L (ref 22–32)
Calcium: 8.6 mg/dL — ABNORMAL LOW (ref 8.9–10.3)
Chloride: 106 mmol/L (ref 98–111)
Creatinine, Ser: 1.36 mg/dL — ABNORMAL HIGH (ref 0.44–1.00)
GFR, Estimated: 42 mL/min — ABNORMAL LOW (ref >60)
Glucose, Bld: 79 mg/dL (ref 70–99)
Potassium: 4.7 mmol/L (ref 3.5–5.1)
Sodium: 142 mmol/L (ref 135–145)

## 2022-05-25 LAB — MAGNESIUM: Magnesium: 2.5 mg/dL — ABNORMAL HIGH (ref 1.7–2.4)

## 2022-05-25 MED ORDER — SODIUM CHLORIDE 0.9 % IV SOLN: INTRAVENOUS | Status: DC

## 2022-05-25 MED ORDER — SODIUM CHLORIDE 0.9 % IV SOLN: INTRAVENOUS | Status: AC

## 2022-05-25 NOTE — Plan of Care (Signed)

## 2022-05-25 NOTE — Consult Note (Signed)
WOC consulted for "clarification of wound care orders" reviewed S. Doty WOC RN notes and updated wound care orders appropriately to include both LEs .  [place], [place], [place]

## 2022-05-25 NOTE — Progress Notes (Signed)
Bilateral lower extremities dressing change done,safety measures in place bed alarm on,bed in low position.

## 2022-05-25 NOTE — TOC Progression Note (Addendum)
Transition of Care [facility]) - Progression Note    Patient Details  Name: Madison Mcbride MRN: [id number] Date of Birth: [birth year]  Transition of Care [facility]) CM/SW [place], LCSW Phone Number: 05/25/2022, 12:42 PM  Clinical Narrative:  [facility] can offer a bed but patient would have to use bipap machine instead of NIV. Admissions coordinator is checking to see what her copays would be. Asked [facility] admissions coordinator to review referral. All other [facility] have declined.   3:30 pm: Husband accepted bed offer from [facility]. Asked admissions to have business office call him about copay payment. [facility] does not accept NIV's so asked MD to order bipap for tonight so we can get her settings.  Expected Discharge Plan: [place] Barriers to Discharge: Continued Medical Work up, Ship broker  Expected Discharge Plan and Services Expected Discharge Plan: [place] In-house Referral: Clinical Social Work Discharge Planning Services: CM Consult Post Acute Care Choice: [place] arrangements for the past 2 months: Single Family Home                 DME Arranged: N/A DME Agency: NA       HH Arranged: RN, PT, OT, Speech Therapy HH Agency: [place] Date [place]: 05/20/22 Time [place]: 1619 Representative spoke with at [place]: [place]   Social Determinants of Health ([place]) Interventions    Readmission Risk Interventions    03/07/2022    4:12 PM  Readmission Risk Prevention Plan  Transportation Screening Complete  PCP or Specialist Appt within 5-7 Days Complete  Home Care Screening Complete  Medication Review (RN CM) Complete

## 2022-05-25 NOTE — Progress Notes (Signed)
PROGRESS NOTE    Madison Mcbride  BOF:[id number] DOB: 21-Jan-1956 DOA: 05/20/2022 PCP: Rusty Aus, MD   Brief Narrative:  66 y.o.f w/ hx of  chronic hypoxic respiratory failure on 4 L [place] with ambulation, HFpEF (LVEF 45-50%), OSA on BiPAP, atrial fibrillation, T2DM,hypothyroidism presented with buttock sores. She is mostly sedentary at home, in recent days her bottom was having sores and patient is having burning and itching.  She also endorses fall 2 days PTA where her knees buckled-did not hit her head or had any injuries. Complaining of mild dysuria recently. In the ED vitals stable saturating 98% 2 L,Lab work showed elevated creatinine 1.8 and admitted for AKI. Also found to have iron def anemia and UTI. Ucx grew Enterococcus faecalis.  PT OT has recommended SNF   Assessment & Plan:  Principal Problem:   Acute kidney injury ([place]) Active Problems:   Microcytic anemia   Chronic hypoxic respiratory failure, on home oxygen therapy (HCC)   Congestive heart failure (CHF) (HCC)   Pressure ulcer of buttock   Dysuria   Chronic venous stasis dermatitis of both lower extremities   Inflammatory arthritis   OSA (obstructive sleep apnea)   Type 2 diabetes mellitus with diabetic neuropathy, without long-term current use of insulin (HCC)   Sinus bradycardia     Assessment and Plan: * Acute kidney injury (HCC) Baseline 0.9, admission Cr 1.9.  Creatinine today 1.3.  Getting IV fluids.  Encourage oral intake.  Microcytic anemia Iron Deficiency Iron Def, Last C scope 2017, should follow up outpatient. No gross bleeding.  IV iron while patient is here thereafter switch to p.o. upon discharge   C-scope in 2017 showed multiple polyps which were resected.  Should follow-up outpatient GI for screening colonoscopy  Chronic hypoxic respiratory failure, on home oxygen therapy ([place]) Cont Home O2. Supportive care. Was discharged on 4L [place] in Sept 2023.    Congestive heart failure (CHF)  ([place]) History of CHF with EF of 45-50% on carvedilol and Lasix.  She is hypovolemic at this time. Cont Propranolol, home Coreg and lasix on Hold for now.   Pressure ulcer of buttock - Encouraged to frequently rotate position at home when on her recliner  UTI due to enterococcus faecalis.  Symptomatic with dysuria. S/p One dose of fosfomycin 11/11   Chronic venous stasis dermatitis of both lower extremities Lower extremity edema that is nonpitting in nature with hyperpigmentation consistent with chronic venous stasis.  Patient states symptoms are improving.  No evidence of acute infection.  Patient would like to hold off on Unna boots at this time. Elevated legs whenever possible.   Inflammatory arthritis Per chart review, patient followed up with her PCP for concern of inflammatory arthritis.  Anti-CCP markedly elevated.  She was referred to rheumatology but has not followed up.  Given patient has had increasing falls, this would be prudent to address.  - Outpatient rheumatology follow-up  OSA (obstructive sleep apnea) - BiPAP at night or whenever she is sleeping  Sinus bradycardia Long-term history of chronic bradycardia per previous EKG review.  She is on both carvedilol (for heart failure) and propranolol (for tremors) that likely explains her bradycardia. Currently on Propranolol.   Type 2 diabetes mellitus with diabetic neuropathy, without long-term current use of insulin (HCC) Peripheral Neuropathy - Hold home semaglutide - SSI, moderate. Cont Gabapentin.   Hx of P A fib On Coreg and Eliquis at home.     PT/OT= SNF    DVT prophylaxis: Eliquis  Code Status: Full Family Communication: Spoke with her husband  Medically doing better, will await for placement.   Body mass index is 54.89 kg/m.       Examination: Feels better, no complaints.    Physical Exam Constitutional: Not in acute distress. obese Respiratory: Clear to auscultation  bilaterally Cardiovascular: Normal sinus rhythm, no rubs Abdomen: Nontender nondistended good bowel sounds Musculoskeletal: No edema noted Skin: No rashes seen Neurologic: CN 2-12 grossly intact.  And nonfocal Psychiatric: Normal judgment and insight. Alert and oriented x 3. Normal mood.    Objective: Vitals:   05/24/22 2112 05/24/22 2155 05/24/22 2231 05/25/22 0421  BP: (!) 101/57 111/63  (!) 96/59  Pulse: (!) 58 60 62 (!) 59  Resp: 20   20  Temp: 97.9 F (36.6 C)   97.9 F (36.6 C)  TempSrc: Oral   Oral  SpO2: 95%  97% 93%  Weight:      Height:        Intake/Output Summary (Last 24 hours) at 05/25/2022 0812 Last data filed at 05/25/2022 0300 Gross per 24 hour  Intake 232.32 ml  Output 650 ml  Net -417.68 ml   Filed Weights   05/20/22 2012 05/22/22 0500  Weight: (!) 144.2 kg (!) 154.3 kg     Data Reviewed:   CBC: Recent Labs  Lab 05/20/22 1542 05/21/22 0432 05/22/22 0317 05/23/22 0627 05/24/22 0549 05/25/22 0437  WBC 10.0 10.4 12.8* 11.7* 13.4* 12.1*  NEUTROABS 7.4  --   --   --   --   --   HGB 9.0* 8.5* 8.5* 9.0* 9.4* 9.7*  HCT 32.8* 30.8* 31.3* 33.4* 35.0* 36.8  MCV 77.4* 78.4* 78.1* 78.0* 79.5* 79.7*  PLT 385 358 390 408* 437* 355*   Basic Metabolic Panel: Recent Labs  Lab 05/21/22 0432 05/22/22 0317 05/23/22 0627 05/24/22 0549 05/25/22 0437  NA 140 143 141 142 142  K 4.5 4.3 4.2 4.5 4.7  CL 106 105 105 107 106  CO2 '29 31 31 30 30  '$ GLUCOSE 71 107* 96 93 79  BUN 49* 41* 34* 31* 33*  CREATININE 1.90* 1.51* 1.21* 1.10* 1.36*  CALCIUM 8.2* 8.2* 8.4* 8.6* 8.6*  MG  --   --   --  2.4 2.5*   GFR: Estimated Creatinine Clearance: 60.8 mL/min (A) (by C-G formula based on SCr of 1.36 mg/dL (H)). Liver Function Tests: No results for input(s): "AST", "ALT", "ALKPHOS", "BILITOT", "PROT", "ALBUMIN" in the last 168 hours. No results for input(s): "LIPASE", "AMYLASE" in the last 168 hours. No results for input(s): "AMMONIA" in the last 168  hours. Coagulation Profile: No results for input(s): "INR", "PROTIME" in the last 168 hours. Cardiac Enzymes: No results for input(s): "CKTOTAL", "CKMB", "CKMBINDEX", "TROPONINI" in the last 168 hours. BNP (last 3 results) No results for input(s): "PROBNP" in the last 8760 hours. HbA1C: No results for input(s): "HGBA1C" in the last 72 hours. CBG: Recent Labs  Lab 05/23/22 2101 05/24/22 0830 05/24/22 1230 05/24/22 1633 05/24/22 2112  GLUCAP 118* 92 97 84 102*   Lipid Profile: No results for input(s): "CHOL", "HDL", "LDLCALC", "TRIG", "CHOLHDL", "LDLDIRECT" in the last 72 hours. Thyroid Function Tests: No results for input(s): "TSH", "T4TOTAL", "FREET4", "T3FREE", "THYROIDAB" in the last 72 hours.  Anemia Panel: No results for input(s): "VITAMINB12", "FOLATE", "FERRITIN", "TIBC", "IRON", "RETICCTPCT" in the last 72 hours.  Sepsis Labs: No results for input(s): "PROCALCITON", "LATICACIDVEN" in the last 168 hours.  Recent Results (from the past 240 hour(s))  Urine  Culture     Status: Abnormal   Collection Time: 05/20/22  8:06 PM   Specimen: Urine, Random  Result Value Ref Range Status   Specimen Description   Final    URINE, RANDOM Performed at [facility], [address] [place], [place] [address]    Special Requests   Final    NONE Performed at [facility], [place], [place] [address]    Culture 30,000 COLONIES/mL ENTEROCOCCUS FAECALIS (A)  Final   Report Status 05/23/2022 FINAL  Final   Organism ID, Bacteria ENTEROCOCCUS FAECALIS (A)  Final      Susceptibility   Enterococcus faecalis - MIC*    AMPICILLIN <=2 SENSITIVE Sensitive     NITROFURANTOIN <=16 SENSITIVE Sensitive     VANCOMYCIN 1 SENSITIVE Sensitive     * 30,000 COLONIES/mL ENTEROCOCCUS FAECALIS         Radiology Studies: No results found.      Scheduled Meds:  apixaban  5 mg Oral BID   atorvastatin  20 mg Oral Daily   buPROPion  150 mg Oral Daily    gabapentin  200 mg Oral TID   insulin aspart  0-15 Units Subcutaneous TID WC   levothyroxine  125 mcg Oral Q0600   multivitamin with minerals  1 tablet Oral Daily   pantoprazole  40 mg Oral Daily   propranolol  20 mg Oral TID   rOPINIRole  3 mg Oral QHS   sodium chloride flush  3 mL Intravenous Q12H   zinc oxide   Topical BID   Continuous Infusions:  sodium chloride     iron sucrose 200 mg (05/24/22 1028)     LOS: 2 days   Time spent= 35 mins    Netasha Wehrli Arsenio Loader, MD [facility]  If 7PM-7AM, please contact night-coverage  05/25/2022, 8:12 AM

## 2022-05-26 DIAGNOSIS — N179 Acute kidney failure, unspecified: Secondary | ICD-10-CM | POA: Diagnosis not present

## 2022-05-26 LAB — CBC
HCT: 35.2 % — ABNORMAL LOW (ref 36.0–46.0)
Hemoglobin: 9.3 g/dL — ABNORMAL LOW (ref 12.0–15.0)
MCH: 21.4 pg — ABNORMAL LOW (ref 26.0–34.0)
MCHC: 26.4 g/dL — ABNORMAL LOW (ref 30.0–36.0)
MCV: 81.1 fL (ref 80.0–100.0)
Platelets: 439 10*3/uL — ABNORMAL HIGH (ref 150–400)
RBC: 4.34 MIL/uL (ref 3.87–5.11)
RDW: 21.6 % — ABNORMAL HIGH (ref 11.5–15.5)
WBC: 13.1 10*3/uL — ABNORMAL HIGH (ref 4.0–10.5)
nRBC: 0.8 % — ABNORMAL HIGH (ref 0.0–0.2)

## 2022-05-26 LAB — BASIC METABOLIC PANEL
Anion gap: 8 (ref 5–15)
BUN: 33 mg/dL — ABNORMAL HIGH (ref 8–23)
CO2: 29 mmol/L (ref 22–32)
Calcium: 8.4 mg/dL — ABNORMAL LOW (ref 8.9–10.3)
Chloride: 105 mmol/L (ref 98–111)
Creatinine, Ser: 1.28 mg/dL — ABNORMAL HIGH (ref 0.44–1.00)
GFR, Estimated: 46 mL/min — ABNORMAL LOW (ref >60)
Glucose, Bld: 74 mg/dL (ref 70–99)
Potassium: 4.7 mmol/L (ref 3.5–5.1)
Sodium: 142 mmol/L (ref 135–145)

## 2022-05-26 LAB — MAGNESIUM: Magnesium: 2.5 mg/dL — ABNORMAL HIGH (ref 1.7–2.4)

## 2022-05-26 NOTE — Progress Notes (Signed)
Occupational Therapy Treatment Patient Details Name: Madison Mcbride MRN: [id number] DOB: 23-Jan-1956 Today's Date: 05/26/2022   History of present illness Pt is a 66 y.o. female presenting to hospital 05/20/22.  Per MD note "She is mostly sedentary at home, in recent days her bottom was having sores and patient is having burning and itching.  She also endorses fall 2 days PTA where her knees buckled-did not hit her head or had any injuries.  Complaining of mild dysuria recently."   Pt admitted with AKI, microcytic anemia, CHF, chronic hypoxic respiratory failure (on home O2), pressure ulcer of buttock, dysuria, chronic venous stasis dermatitis of B LE's, inflammatory arthritis, OSA, and sinus bradycardia.  PMH includes CHF, DM, lymphedema, RLS, sleep apnea, thyroid CA.   OT comments  Patient in bed asleep upon arrival, breakfast tray spilled in bed. Aroused for therapy with mod encouragement. Patient bed wet due to tray spilling in bed, requiring linen change. Patient required max A +2 for bed mobility, requiring max verbal cues throughout session. 2 attempts to stand while sitting EOB requiring total A +2. Patient with minimal ability to initiate to attempt standing and max cueing for technique. Unable to clear bed, resisting to standing. Patient placed back in supine with max A +2 for trunk and LEs. Patient left in bed with call bell in reach, bed alarm set, and all needs met.     Recommendations for follow up therapy are one component of a multi-disciplinary discharge planning process, led by the attending physician.  Recommendations may be updated based on patient status, additional functional criteria and insurance authorization.    Follow Up Recommendations  Skilled nursing-short term rehab (<3 hours/day)     Assistance Recommended at Discharge Intermittent Supervision/Assistance  Patient can return home with the following  A lot of help with walking and/or transfers;A lot of help with  bathing/dressing/bathroom;Assistance with cooking/housework;Help with stairs or ramp for entrance;Assist for transportation   Equipment Recommendations  Other (comment) (Defer to next venur of care)       Precautions / Restrictions Precautions Precautions: Fall Restrictions Weight Bearing Restrictions: No       Mobility Bed Mobility Overal bed mobility: Needs Assistance Bed Mobility: Supine to Sit, Sit to Supine     Supine to sit: Max assist, +2 for physical assistance, HOB elevated Sit to supine: Max assist, +2 for physical assistance        Transfers Overall transfer level: Needs assistance Equipment used: Rolling walker (2 wheels) Transfers: Sit to/from Stand Sit to Stand: Total assist, +2 physical assistance           General transfer comment: pt with minimal ability to initiate to attempt standing with 2 assist from elevated surface and max cueing for technique     Balance Overall balance assessment: Needs assistance Sitting-balance support: No upper extremity supported, Feet supported Sitting balance-Leahy Scale: Poor Sitting balance - Comments: initially min assist d/t posterior lean.   Standing balance support: Reliant on assistive device for balance, Bilateral upper extremity supported, During functional activity Standing balance-Leahy Scale: Zero Standing balance comment: Patient resisting, unable to clear bed with 2 person assist.                           ADL either performed or assessed with clinical judgement   ADL                   Upper Body Dressing : Set up  Extremity/Trunk Assessment Upper Extremity Assessment Upper Extremity Assessment: Generalized weakness   Lower Extremity Assessment Lower Extremity Assessment: Generalized weakness        Vision Patient Visual Report: No change from baseline            Cognition Arousal/Alertness: Lethargic Behavior During Therapy: WFL for  tasks assessed/performed Overall Cognitive Status: Impaired/Different from baseline                                 General Comments: Patient apprears very lethargic during session. A&Ox4              General Comments B LE's wrapped    Pertinent Vitals/ Pain       Pain Assessment Pain Assessment: Faces Faces Pain Scale: Hurts little more Pain Location: B LE's Pain Descriptors / Indicators: Discomfort, Grimacing Pain Intervention(s): Limited activity within patient's tolerance, Monitored during session, Repositioned   Frequency  Min 2X/week        Progress Toward Goals  OT Goals(current goals can now be found in the care plan section)     Acute Rehab OT Goals Patient Stated Goal: get better OT Goal Formulation: With patient Time For Goal Achievement: 06/05/22 Potential to Achieve Goals: Fair   AM-PAC OT "6 Clicks" Daily Activity     Outcome Measure   Help from another person eating meals?: None Help from another person taking care of personal grooming?: A Little Help from another person toileting, which includes using toliet, bedpan, or urinal?: Total Help from another person bathing (including washing, rinsing, drying)?: A Lot Help from another person to put on and taking off regular upper body clothing?: A Little Help from another person to put on and taking off regular lower body clothing?: Total 6 Click Score: 14    End of Session Equipment Utilized During Treatment: Rolling walker (2 wheels);Oxygen  OT Visit Diagnosis: Unsteadiness on feet (R26.81);Repeated falls (R29.6);Muscle weakness (generalized) (M62.81)   Activity Tolerance Patient limited by fatigue   Patient Left in bed;with call bell/phone within reach;with bed alarm set   Nurse Communication Mobility status        Time: 2707-8675 OT Time Calculation (min): 17 min  Charges: OT General Charges $OT Visit: 1 Visit OT Treatments $Therapeutic Activity: 8-22  mins    Treniyah Lynn, OTS 05/26/2022, 3:53 PM

## 2022-05-26 NOTE — Progress Notes (Signed)
PROGRESS NOTE    Madison Mcbride  XFG:[id number] DOB: June 01, 1956 DOA: 05/20/2022 PCP: Rusty Aus, MD   Brief Narrative:  66 y.o.f w/ hx of  chronic hypoxic respiratory failure on 4 L [place] with ambulation, HFpEF (LVEF 45-50%), OSA on BiPAP, atrial fibrillation, T2DM,hypothyroidism presented with buttock sores. She is mostly sedentary at home, in recent days her bottom was having sores and patient is having burning and itching.  She also endorses fall 2 days PTA where her knees buckled-did not hit her head or had any injuries. Complaining of mild dysuria recently. In the ED vitals stable saturating 98% 2 L,Lab work showed elevated creatinine 1.8 and admitted for AKI. Also found to have iron def anemia and UTI. Ucx grew Enterococcus faecalis.  PT OT has recommended SNF   Assessment & Plan:  Principal Problem:   Acute kidney injury ([place]) Active Problems:   Microcytic anemia   Chronic hypoxic respiratory failure, on home oxygen therapy (HCC)   Congestive heart failure (CHF) (HCC)   Pressure ulcer of buttock   Dysuria   Chronic venous stasis dermatitis of both lower extremities   Inflammatory arthritis   OSA (obstructive sleep apnea)   Type 2 diabetes mellitus with diabetic neuropathy, without long-term current use of insulin (HCC)   Sinus bradycardia     Assessment and Plan: * Acute kidney injury (HCC) Baseline 0.9, admission Cr 1.9.  Creatinine improved, 1.2  Microcytic anemia Iron Deficiency Iron Def, Last C scope 2017, should follow up outpatient. No gross bleeding.  Continue IV iron, switch to p.o. upon discharge   C-scope in 2017 showed multiple polyps which were resected.  Should follow-up outpatient GI for screening colonoscopy  Chronic hypoxic respiratory failure, on home oxygen therapy ([place]) Cont Home O2. Supportive care. Was discharged on 4L [place] in Sept 2023.    Congestive heart failure (CHF) ([place]) History of CHF with EF of 45-50% on carvedilol and Lasix.  She  is hypovolemic at this time due to poor oral intake. Cont Propranolol, home Coreg and lasix on Hold for now.   Pressure ulcer of buttock - Encouraged to frequently rotate position at home when on her recliner  UTI due to enterococcus faecalis.  Symptomatic with dysuria. S/p One dose of fosfomycin 11/11   Chronic venous stasis dermatitis of both lower extremities Lower extremity edema that is nonpitting in nature with hyperpigmentation consistent with chronic venous stasis.  Patient states symptoms are improving.  No evidence of acute infection.  Patient would like to hold off on [place] boots at this time. Elevated legs whenever possible.   Inflammatory arthritis Per chart review, patient followed up with her PCP for concern of inflammatory arthritis.  Anti-CCP markedly elevated.  She was referred to rheumatology but has not followed up.  Given patient has had increasing falls, this would be prudent to address.  - Outpatient rheumatology follow-up  OSA (obstructive sleep apnea) - BiPAP at night or whenever she is sleeping  Sinus bradycardia Long-term history of chronic bradycardia per previous EKG review.  She is on both carvedilol (for heart failure) and propranolol (for tremors) that likely explains her bradycardia. Currently on Propranolol.   Type 2 diabetes mellitus with diabetic neuropathy, without long-term current use of insulin (HCC) Peripheral Neuropathy - Hold home semaglutide - SSI, moderate. Cont Gabapentin.   Hx of P A fib On Coreg and Eliquis at home.     PT/OT= SNF    DVT prophylaxis: Eliquis Code Status: Full Family Communication: Husband updated  periodically  Medically doing better, will await for placement.   Body mass index is 54.94 kg/m.       Examination: When I was in the room patient's oxygen was off and she reported of shortness of breath.  No other complaints.  Physical Exam Constitutional: Not in acute distress, obese Respiratory:  Diminished breath sounds Cardiovascular: Normal sinus rhythm, no rubs Abdomen: Nontender nondistended good bowel sounds Musculoskeletal: No edema noted Skin: No rashes seen Neurologic: CN 2-12 grossly intact.  And nonfocal Psychiatric: Normal judgment and insight. Alert and oriented x 3. Normal mood. Objective: Vitals:   05/25/22 1942 05/26/22 0400 05/26/22 0410 05/26/22 0750  BP: (!) 119/58 115/63  115/63  Pulse: (!) 54 (!) 57  (!) 55  Resp: '19 17  18  '$ Temp: 97.8 F (36.6 C) 98 F (36.7 C)  97.8 F (36.6 C)  TempSrc: Oral Oral  Oral  SpO2: 99% 99%  99%  Weight:   (!) 154.4 kg   Height:        Intake/Output Summary (Last 24 hours) at 05/26/2022 1005 Last data filed at 05/26/2022 0412 Gross per 24 hour  Intake --  Output 650 ml  Net -650 ml   Filed Weights   05/20/22 2012 05/22/22 0500 05/26/22 0410  Weight: (!) 144.2 kg (!) 154.3 kg (!) 154.4 kg     Data Reviewed:   CBC: Recent Labs  Lab 05/20/22 1542 05/21/22 0432 05/22/22 0317 05/23/22 0627 05/24/22 0549 05/25/22 0437 05/26/22 0619  WBC 10.0   < > 12.8* 11.7* 13.4* 12.1* 13.1*  NEUTROABS 7.4  --   --   --   --   --   --   HGB 9.0*   < > 8.5* 9.0* 9.4* 9.7* 9.3*  HCT 32.8*   < > 31.3* 33.4* 35.0* 36.8 35.2*  MCV 77.4*   < > 78.1* 78.0* 79.5* 79.7* 81.1  PLT 385   < > 390 408* 437* 441* 439*   < > = values in this interval not displayed.   Basic Metabolic Panel: Recent Labs  Lab 05/22/22 0317 05/23/22 0627 05/24/22 0549 05/25/22 0437 05/26/22 0619  NA 143 141 142 142 142  K 4.3 4.2 4.5 4.7 4.7  CL 105 105 107 106 105  CO2 '31 31 30 30 29  '$ GLUCOSE 107* 96 93 79 74  BUN 41* 34* 31* 33* 33*  CREATININE 1.51* 1.21* 1.10* 1.36* 1.28*  CALCIUM 8.2* 8.4* 8.6* 8.6* 8.4*  MG  --   --  2.4 2.5* 2.5*   GFR: Estimated Creatinine Clearance: 64.6 mL/min (A) (by C-G formula based on SCr of 1.28 mg/dL (H)). Liver Function Tests: No results for input(s): "AST", "ALT", "ALKPHOS", "BILITOT", "PROT", "ALBUMIN"  in the last 168 hours. No results for input(s): "LIPASE", "AMYLASE" in the last 168 hours. No results for input(s): "AMMONIA" in the last 168 hours. Coagulation Profile: No results for input(s): "INR", "PROTIME" in the last 168 hours. Cardiac Enzymes: No results for input(s): "CKTOTAL", "CKMB", "CKMBINDEX", "TROPONINI" in the last 168 hours. BNP (last 3 results) No results for input(s): "PROBNP" in the last 8760 hours. HbA1C: No results for input(s): "HGBA1C" in the last 72 hours. CBG: Recent Labs  Lab 05/24/22 1230 05/24/22 1633 05/24/22 2112 05/25/22 0819 05/25/22 1147  GLUCAP 97 84 102* 72 100*   Lipid Profile: No results for input(s): "CHOL", "HDL", "LDLCALC", "TRIG", "CHOLHDL", "LDLDIRECT" in the last 72 hours. Thyroid Function Tests: No results for input(s): "TSH", "T4TOTAL", "FREET4", "T3FREE", "THYROIDAB" in  the last 72 hours.  Anemia Panel: No results for input(s): "VITAMINB12", "FOLATE", "FERRITIN", "TIBC", "IRON", "RETICCTPCT" in the last 72 hours.  Sepsis Labs: No results for input(s): "PROCALCITON", "LATICACIDVEN" in the last 168 hours.  Recent Results (from the past 240 hour(s))  Urine Culture     Status: Abnormal   Collection Time: 05/20/22  8:06 PM   Specimen: Urine, Random  Result Value Ref Range Status   Specimen Description   Final    URINE, RANDOM Performed at [facility], [address] [place], [place] [address]    Special Requests   Final    NONE Performed at [facility], [place], [place] [address]    Culture 30,000 COLONIES/mL ENTEROCOCCUS FAECALIS (A)  Final   Report Status 05/23/2022 FINAL  Final   Organism ID, Bacteria ENTEROCOCCUS FAECALIS (A)  Final      Susceptibility   Enterococcus faecalis - MIC*    AMPICILLIN <=2 SENSITIVE Sensitive     NITROFURANTOIN <=16 SENSITIVE Sensitive     VANCOMYCIN 1 SENSITIVE Sensitive     * 30,000 COLONIES/mL ENTEROCOCCUS FAECALIS         Radiology  Studies: No results found.      Scheduled Meds:  apixaban  5 mg Oral BID   atorvastatin  20 mg Oral Daily   buPROPion  150 mg Oral Daily   gabapentin  200 mg Oral TID   levothyroxine  125 mcg Oral Q0600   multivitamin with minerals  1 tablet Oral Daily   pantoprazole  40 mg Oral Daily   propranolol  20 mg Oral TID   rOPINIRole  3 mg Oral QHS   sodium chloride flush  3 mL Intravenous Q12H   zinc oxide   Topical BID   Continuous Infusions:  iron sucrose 200 mg (05/25/22 0903)     LOS: 3 days   Time spent= 35 mins    Rodrigus Kilker Arsenio Loader, MD [facility]  If 7PM-7AM, please contact night-coverage  05/26/2022, 10:05 AM

## 2022-05-26 NOTE — Progress Notes (Incomplete)
Occupational Therapy Treatment Patient Details Name: Madison Mcbride MRN: [id number] DOB: March 31, 1956 Today's Date: 05/26/2022   History of present illness Pt is a 66 y.o. female presenting to hospital 05/20/22.  Per MD note "She is mostly sedentary at home, in recent days her bottom was having sores and patient is having burning and itching.  She also endorses fall 2 days PTA where her knees buckled-did not hit her head or had any injuries.  Complaining of mild dysuria recently."   Pt admitted with AKI, microcytic anemia, CHF, chronic hypoxic respiratory failure (on home O2), pressure ulcer of buttock, dysuria, chronic venous stasis dermatitis of B LE's, inflammatory arthritis, OSA, and sinus bradycardia.  PMH includes CHF, DM, lymphedema, RLS, sleep apnea, thyroid CA.   OT comments  Patient in bed asleep upon arrival, breakfast tray spilled in bed. Aroused for therapy with mod encouragement. Patient bed wet due to tray spilling in bed, requiring linen change. Patient required max A +2 for bed mobility, requiring max verbal cues throughout session. 2 attempts to stand while sitting EOB requiring total A +2. Patient with minimal ability to initiate to attempt standing and max cueing for technique. Unable to clear bed, resisting to standing. Patient placed back in supine with max A +2 for trunk and Les. Patient left in bed with call bell in reach, bed alarm set, and all needs met.     Recommendations for follow up therapy are one component of a multi-disciplinary discharge planning process, led by the attending physician.  Recommendations may be updated based on patient status, additional functional criteria and insurance authorization.    Follow Up Recommendations  Skilled nursing-short term rehab (<3 hours/day)     Assistance Recommended at Discharge Intermittent Supervision/Assistance  Patient can return home with the following  A lot of help with walking and/or transfers;A lot of help with  bathing/dressing/bathroom;Assistance with cooking/housework;Help with stairs or ramp for entrance;Assist for transportation   Equipment Recommendations  Other (comment) (Defer to next venur of care)       Precautions / Restrictions Precautions Precautions: Fall Restrictions Weight Bearing Restrictions: No       Mobility Bed Mobility Overal bed mobility: Needs Assistance Bed Mobility: Supine to Sit, Sit to Supine     Supine to sit: Max assist, +2 for physical assistance, HOB elevated          Transfers Overall transfer level: Needs assistance Equipment used: Rolling walker (2 wheels) Transfers: Sit to/from Stand Sit to Stand: Total assist, +2 physical assistance           General transfer comment: pt with minimal ability to initiate to attempt standing with 2 assist from elevated surface and max cueing for technique     Balance Overall balance assessment: Needs assistance Sitting-balance support: No upper extremity supported, Feet supported Sitting balance-Leahy Scale: Poor Sitting balance - Comments: initially min assist d/t posterior lean.   Standing balance support: Reliant on assistive device for balance, Bilateral upper extremity supported, During functional activity Standing balance-Leahy Scale: Zero Standing balance comment: Patient resisting, unable to clear bed with 2 person assist.                           ADL either performed or assessed with clinical judgement   ADL                   Upper Body Dressing : Set up  Extremity/Trunk Assessment Upper Extremity Assessment Upper Extremity Assessment: Generalized weakness   Lower Extremity Assessment Lower Extremity Assessment: Generalized weakness        Vision Patient Visual Report: No change from baseline            Cognition Arousal/Alertness: Lethargic Behavior During Therapy: WFL for tasks assessed/performed Overall Cognitive Status:  Impaired/Different from baseline                                 General Comments: Patient apprears very lethargic during session. A&Ox4              General Comments B LE's wrapped    Pertinent Vitals/ Pain       Pain Assessment Pain Assessment: Faces Faces Pain Scale: Hurts little more Pain Location: B LE's Pain Descriptors / Indicators: Discomfort, Grimacing Pain Intervention(s): Limited activity within patient's tolerance, Monitored during session, Repositioned   Frequency  Min 2X/week        Progress Toward Goals  OT Goals(current goals can now be found in the care plan section)     Acute Rehab OT Goals Patient Stated Goal: get better OT Goal Formulation: With patient Time For Goal Achievement: 06/05/22 Potential to Achieve Goals: Fair   AM-PAC OT "6 Clicks" Daily Activity     Outcome Measure   Help from another person eating meals?: None Help from another person taking care of personal grooming?: A Little Help from another person toileting, which includes using toliet, bedpan, or urinal?: Total Help from another person bathing (including washing, rinsing, drying)?: A Lot Help from another person to put on and taking off regular upper body clothing?: A Little Help from another person to put on and taking off regular lower body clothing?: Total 6 Click Score: 14    End of Session Equipment Utilized During Treatment: Rolling walker (2 wheels);Oxygen  OT Visit Diagnosis: Unsteadiness on feet (R26.81);Repeated falls (R29.6);Muscle weakness (generalized) (M62.81)   Activity Tolerance Patient limited by fatigue   Patient Left in bed;with call bell/phone within reach;with bed alarm set   Nurse Communication Mobility status        Time: 4847-2072 OT Time Calculation (min): 17 min  Charges: OT General Charges $OT Visit: 1 Visit OT Treatments $Therapeutic Activity: 8-22 mins    Jaquaya Coyle, OTS 05/26/2022, 3:28 PM

## 2022-05-26 NOTE — Care Management Important Message (Signed)
Important Message  Patient Details  Name: Lania Zawistowski MRN: [id number] Date of Birth: 1955/09/26   Medicare Important Message Given:  Yes     Dannette Barbara 05/26/2022, 10:46 AM

## 2022-05-26 NOTE — Plan of Care (Signed)

## 2022-05-26 NOTE — Progress Notes (Signed)
Physical Therapy Treatment Patient Details Name: Madison Mcbride MRN: [id number] DOB: [birth year] Today's Date: 05/26/2022   History of Present Illness Pt is a 66 y.o. female presenting to hospital 05/20/22.  Per MD note "She is mostly sedentary at home, in recent days her bottom was having sores and patient is having burning and itching.  She also endorses fall 2 days PTA where her knees buckled-did not hit her head or had any injuries.  Complaining of mild dysuria recently."   Pt admitted with AKI, microcytic anemia, CHF, chronic hypoxic respiratory failure (on home O2), pressure ulcer of buttock, dysuria, chronic venous stasis dermatitis of B LE's, inflammatory arthritis, OSA, and sinus bradycardia.  PMH includes CHF, DM, lymphedema, RLS, sleep apnea, thyroid CA.    PT Comments    Pt resting in bed upon PT arrival; agreeable to PT session.  Pt appearing with generalized confusion during session although A&O x4.  Pt noted with decreased insight into deficits; also increased time to process and initiate for activity.  Increased generalized weakness noted today.  During session pt max assist x2 with bed mobility; min assist progressing to close SBA with sitting balance; and unable to stand up to walker with 2 assist and bed height elevated.  Pt assisted back to bed end of session and repositioned for comfort.  Will continue to focus on strengthening, balance, and progressive functional mobility during hospitalization.   Recommendations for follow up therapy are one component of a multi-disciplinary discharge planning process, led by the attending physician.  Recommendations may be updated based on patient status, additional functional criteria and insurance authorization.  Follow Up Recommendations  Skilled nursing-short term rehab (<3 hours/day) Can patient physically be transported by private vehicle: No   Assistance Recommended at Discharge Frequent or constant Supervision/Assistance   Patient can return home with the following Two people to help with walking and/or transfers;Two people to help with bathing/dressing/bathroom;Assistance with cooking/housework;Assist for transportation;Help with stairs or ramp for entrance   Equipment Recommendations  Rolling walker (2 wheels);Wheelchair (measurements PT);BSC/3in1;Wheelchair cushion (measurements PT);Hospital bed (bariatric RW; hoyer lift)    Recommendations for Other Services       Precautions / Restrictions Precautions Precautions: Fall Restrictions Weight Bearing Restrictions: No     Mobility  Bed Mobility Overal bed mobility: Needs Assistance Bed Mobility: Supine to Sit, Sit to Supine     Supine to sit: Max assist, +2 for physical assistance, HOB elevated Sit to supine: Max assist, +2 for physical assistance, HOB elevated   General bed mobility comments: assist for B LE's and trunk; vc's for technique; 2 assist to boost pt up in bed using bed sheet    Transfers Overall transfer level: Needs assistance Equipment used: Rolling walker (2 wheels) (bariatric) Transfers: Sit to/from Stand Sit to Stand: Total assist, +2 physical assistance, From elevated surface           General transfer comment: pt with minimal ability to initiate to attempt standing with 2 assist from elevated surface and max cueing for technique    Ambulation/Gait               General Gait Details: unable to stand to attempt   Stairs             Wheelchair Mobility    Modified Rankin (Stroke Patients Only)       Balance Overall balance assessment: Needs assistance Sitting-balance support: No upper extremity supported, Feet supported Sitting balance-Leahy Scale: Poor Sitting balance - Comments: initially min  assist d/t posterior lean but improved to close SBA with repositioning and cueing                                    Cognition Arousal/Alertness: Awake/alert Behavior During Therapy: WFL  for tasks assessed/performed Overall Cognitive Status: Impaired/Different from baseline                                 General Comments: pt A&O x4 but appearing confused with decreased insight into deficits; increased time to process and initiate for activity        Exercises      General Comments General comments (skin integrity, edema, etc.): B LE's wrapped.  Pt agreeable to PT session.      Pertinent Vitals/Pain Pain Assessment Pain Assessment: Faces Faces Pain Scale: Hurts little more Pain Location: B LE's Pain Descriptors / Indicators: Discomfort, Grimacing Pain Intervention(s): Limited activity within patient's tolerance, Monitored during session, Repositioned Vitals (HR and O2 on 3 L O2 via nasal cannula) stable and WFL throughout treatment session.    Home Living                          Prior Function            PT Goals (current goals can now be found in the care plan section) Acute Rehab PT Goals Patient Stated Goal: to improve strength and mobility PT Goal Formulation: With patient Time For Goal Achievement: 06/05/22 Potential to Achieve Goals: Fair Progress towards PT goals: Progressing toward goals    Frequency    Min 2X/week      PT Plan Current plan remains appropriate    Co-evaluation              AM-PAC PT "6 Clicks" Mobility   Outcome Measure  Help needed turning from your back to your side while in a flat bed without using bedrails?: Total Help needed moving from lying on your back to sitting on the side of a flat bed without using bedrails?: Total Help needed moving to and from a bed to a chair (including a wheelchair)?: Total Help needed standing up from a chair using your arms (e.g., wheelchair or bedside chair)?: Total Help needed to walk in hospital room?: Total Help needed climbing 3-5 steps with a railing? : Total 6 Click Score: 6    End of Session Equipment Utilized During Treatment: Gait  belt Activity Tolerance: Patient limited by fatigue Patient left: in bed;with call bell/phone within reach;with bed alarm set Nurse Communication: Mobility status;Precautions PT Visit Diagnosis: Other abnormalities of gait and mobility (R26.89);Muscle weakness (generalized) (M62.81);History of falling (Z91.81)     Time: 2878-6767 PT Time Calculation (min) (ACUTE ONLY): 34 min  Charges:  $Therapeutic Activity: 23-37 mins                     Leitha Bleak, PT 05/26/22, 3:20 PM

## 2022-05-26 NOTE — Progress Notes (Signed)
Pt has a home trilogy machine. It is set to AVAPS mode with a VT of 400, EPAP 4/10 min-max, and PS 6/16 min-max. Pt is currently requiring a 3L [place].

## 2022-05-26 NOTE — TOC Progression Note (Addendum)
Transition of Care [facility]) - Progression Note    Patient Details  Name: Madison Mcbride MRN: [id number] Date of Birth: 1956/02/14  Transition of Care [facility]) CM/SW [place], LCSW Phone Number: 05/26/2022, 10:12 AM  Clinical Narrative: No bipap settings entered in flowsheet last night. RN will reach out to respiratory for this. Started Ship broker for [employer]. Will need updated therapy notes. PT will see her before lunch.    12:52 pm: Respiratory will notify team to put patient on bipap machine tonight.  1:33 pm: [facility] admissions assistant confirmed business office has spoken with husband regarding copays.  3:26 pm: Uploaded today's PT note into [place] portal.  Expected Discharge Plan: [place] Barriers to Discharge: Continued Medical Work up, Ship broker  Expected Discharge Plan and Services Expected Discharge Plan: [place] In-house Referral: Clinical Social Work Discharge Planning Services: [employer] Consult Post Acute Care Choice: [place] arrangements for the past 2 months: Single Family Home                 DME Arranged: N/A DME Agency: NA       HH Arranged: RN, PT, OT, Speech Therapy HH Agency: [place] Date [place]: 05/20/22 Time [place]: 1619 Representative spoke with at [place]: [place]   Social Determinants of Health ([place]) Interventions    Readmission Risk Interventions    03/07/2022    4:12 PM  Readmission Risk Prevention Plan  Transportation Screening Complete  PCP or Specialist Appt within 5-7 Days Complete  Home Care Screening Complete  Medication Review (RN CM) Complete

## 2022-05-27 ENCOUNTER — Inpatient Hospital Stay: Payer: Medicare Other

## 2022-05-27 DIAGNOSIS — N179 Acute kidney failure, unspecified: Secondary | ICD-10-CM | POA: Diagnosis not present

## 2022-05-27 LAB — BASIC METABOLIC PANEL
Anion gap: 7 (ref 5–15)
BUN: 32 mg/dL — ABNORMAL HIGH (ref 8–23)
CO2: 30 mmol/L (ref 22–32)
Calcium: 8.7 mg/dL — ABNORMAL LOW (ref 8.9–10.3)
Chloride: 105 mmol/L (ref 98–111)
Creatinine, Ser: 1.19 mg/dL — ABNORMAL HIGH (ref 0.44–1.00)
GFR, Estimated: 50 mL/min — ABNORMAL LOW (ref >60)
Glucose, Bld: 92 mg/dL (ref 70–99)
Potassium: 5 mmol/L (ref 3.5–5.1)
Sodium: 142 mmol/L (ref 135–145)

## 2022-05-27 LAB — CBC
HCT: 35.2 % — ABNORMAL LOW (ref 36.0–46.0)
Hemoglobin: 9.3 g/dL — ABNORMAL LOW (ref 12.0–15.0)
MCH: 21.5 pg — ABNORMAL LOW (ref 26.0–34.0)
MCHC: 26.4 g/dL — ABNORMAL LOW (ref 30.0–36.0)
MCV: 81.5 fL (ref 80.0–100.0)
Platelets: 459 10*3/uL — ABNORMAL HIGH (ref 150–400)
RBC: 4.32 MIL/uL (ref 3.87–5.11)
RDW: 22.2 % — ABNORMAL HIGH (ref 11.5–15.5)
WBC: 13.4 10*3/uL — ABNORMAL HIGH (ref 4.0–10.5)
nRBC: 0.5 % — ABNORMAL HIGH (ref 0.0–0.2)

## 2022-05-27 LAB — URINALYSIS, COMPLETE (UACMP) WITH MICROSCOPIC
Bilirubin Urine: NEGATIVE
Glucose, UA: NEGATIVE mg/dL
Hgb urine dipstick: NEGATIVE
Ketones, ur: NEGATIVE mg/dL
Nitrite: NEGATIVE
Protein, ur: 30 mg/dL — AB
Specific Gravity, Urine: 1.018 (ref 1.005–1.030)
pH: 5 (ref 5.0–8.0)

## 2022-05-27 LAB — BLOOD GAS, VENOUS
Acid-Base Excess: 4.4 mmol/L — ABNORMAL HIGH (ref 0.0–2.0)
Bicarbonate: 33.2 mmol/L — ABNORMAL HIGH (ref 20.0–28.0)
O2 Saturation: 95.6 %
Patient temperature: 37
pCO2, Ven: 69 mmHg — ABNORMAL HIGH (ref 44–60)
pH, Ven: 7.29 (ref 7.25–7.43)
pO2, Ven: 77 mmHg — ABNORMAL HIGH (ref 32–45)

## 2022-05-27 LAB — MAGNESIUM: Magnesium: 2.6 mg/dL — ABNORMAL HIGH (ref 1.7–2.4)

## 2022-05-27 MED ORDER — SODIUM CHLORIDE 0.9 % IV SOLN
1.0000 g | INTRAVENOUS | Status: DC
  Filled 2022-05-27: qty 10

## 2022-05-27 MED ORDER — PROPRANOLOL HCL 10 MG PO TABS
20.0000 mg | ORAL_TABLET | Freq: Two times a day (BID) | ORAL | Status: DC
  Administered 2022-05-28 – 2022-05-29 (×2): 20 mg via ORAL
  Filled 2022-05-27 (×4): qty 2

## 2022-05-27 MED ORDER — FUROSEMIDE 40 MG PO TABS
40.0000 mg | ORAL_TABLET | Freq: Two times a day (BID) | ORAL | Status: DC
  Administered 2022-05-27 – 2022-05-29 (×4): 40 mg via ORAL
  Filled 2022-05-27 (×4): qty 1

## 2022-05-27 MED ORDER — SODIUM CHLORIDE 0.9 % IV SOLN
1.0000 g | INTRAVENOUS | Status: DC
  Administered 2022-05-27 – 2022-05-28 (×2): 1 g via INTRAVENOUS
  Filled 2022-05-27: qty 10
  Filled 2022-05-27: qty 1
  Filled 2022-05-27: qty 10

## 2022-05-27 NOTE — Progress Notes (Signed)
Contacted by phone'[phone]  by Otelia Limes, husband [phone]. He mentioned he had several concerns about some of the care and the potential for her being discharged today. His concerns were, she is weak and sob, which she has had in the past, he states he feels like she is getting weaker and nothing is being done to find out what is going on with her.PT has worked with her, and states it is difficult for her to stand, and he states she cannot even feed herself.He states she has become confused since she has been in the hospital, and believes his concerns are being dismissed due to her weight her age and being in the hospital. He also does not understand why a pulmonologist has not been consulted. He brought her CPAP from home at night. He believed she needed an ABG last night and did not understand why the provider didn't order one. He again stated he believes it is not safe for her to be discharged to rehab([facility]) and has concerns she will get weaker or fall if she is discharged today. He stated he was concerned about her HR getting down to 51 last night. He also mentioned on Sunday night, her O2 tubing had gotten disconnected and he didn't know how that might have adversely affected her.While explaining his concerns, he was very clear and polite.He also mentioned the RN taking care of her last night(11/14)was very caring and concerned about his wife. I made Dr. Priscella Mann and Pauline Good RN aware of concerns. Made AD Virgilio Belling RN aware of concerns. Will make patient experience aware.

## 2022-05-27 NOTE — Progress Notes (Signed)
PROGRESS NOTE    Madison Mcbride  ACZ:[id number] DOB: 29-Aug-1955 DOA: 05/20/2022 PCP: Rusty Aus, MD    Brief Narrative:  66 y.o.f w/ hx of  chronic hypoxic respiratory failure on 4 L [place] with ambulation, HFpEF (LVEF 45-50%), OSA on BiPAP, atrial fibrillation, T2DM,hypothyroidism presented with buttock sores. She is mostly sedentary at home, in recent days her bottom was having sores and patient is having burning and itching.  She also endorses fall 2 days PTA where her knees buckled-did not hit her head or had any injuries. Complaining of mild dysuria recently. In the ED vitals stable saturating 98% 2 L,Lab work showed elevated creatinine 1.8 and admitted for AKI. Also found to have iron def anemia and UTI. Ucx grew Enterococcus faecalis.  PT OT has recommended SNF   11/15: Patient is overall improving but has been intermittently lethargic with altered mentation and weakness.  Per therapy the symptoms were significantly worse on 11/14.  On 11/15 venous blood gas demonstrates mild respiratory acidosis indicative of chronic retention.  Pulmonary consultation requested.  Case discussed with Dr. Lanney Gins, who is familiar with the patient from clinic.    Chest x-ray done this morning demonstrates pulmonary venous congestion indicative of mildly decompensated heart failure.  Restarted diuretics.  Unable to exclude urinary tract infection.  Given mild leukocytosis and altered mentation will treat empirically with intravenous Rocephin.  Assessment & Plan:   Principal Problem:   Acute kidney injury ([place]) Active Problems:   Microcytic anemia   Chronic hypoxic respiratory failure, on home oxygen therapy (HCC)   Congestive heart failure (CHF) (HCC)   Pressure ulcer of buttock   Dysuria   Chronic venous stasis dermatitis of both lower extremities   Inflammatory arthritis   OSA (obstructive sleep apnea)   Type 2 diabetes mellitus with diabetic neuropathy, without long-term current use of  insulin (HCC)   Sinus bradycardia  Acute metabolic encephalopathy [name] has been concerned about increasing lethargy.  Patient falls asleep apparently easily.  Unable to hold conversation.  This is likely multifactorial in origin given to chronic CO2 narcosis.  Unable to exclude recurrent urinary tract infection given many bacteria in urine. Plan: BiPAP nightly and during naps Check urine culture Start empiric Rocephin  * Acute kidney injury ([place]) Baseline 0.9, admission Cr 1.9.  Creatinine improved, 1.2 Creatinine 1.19 on 11/15 Suspect multifactorial etiology to acute kidney injury, unable to exclude ATN versus period of ischemia We will restart Lasix 40 mg p.o. twice daily on 11/15   Microcytic anemia Iron Deficiency Iron Def, Last C scope 2017, should follow up outpatient. No gross bleeding.  Received 5 doses of IV Venofer 200 mg Recommend p.o. iron at discharge Outpatient follow-up with GI for screening colonoscopy  Chronic hypoxic respiratory failure, on home oxygen therapy ([place]) Cont Home O2. Supportive care. Was discharged on 4L [place] in Sept 2023.      Acute on chronic systolic heart failure History of CHF with EF of 45-50% on carvedilol and Lasix.  Unclear whether weights are accurate however patient is apparently up 20+ pounds since 11/8.  Chest x-ray on 11/15 demonstrates pulmonary venous congestion.  Per patient and husband she is having increasing shortness of breath.  Will restart home Lasix 11/15.  Ensure net 0 to negative fluid balance.   Pressure ulcer of buttock - Encouraged to frequently rotate position at home when on her recliner   UTI due to enterococcus faecalis.  Symptomatic with dysuria. S/p One dose of fosfomycin 11/11  Possible recurrent UTI Check urine culture Start empiric Rocephin on 11/15     Chronic venous stasis dermatitis of both lower extremities Lower extremity edema that is nonpitting in nature with hyperpigmentation consistent  with chronic venous stasis.  Patient states symptoms are improving.  No evidence of acute infection.  Patient would like to hold off on Unna boots at this time. Elevated legs whenever possible.    Inflammatory arthritis Per chart review, patient followed up with her PCP for concern of inflammatory arthritis.  Anti-CCP markedly elevated.  She was referred to rheumatology but has not followed up.  Given patient has had increasing falls, this would be prudent to address.   - Outpatient rheumatology follow-up   OSA (obstructive sleep apnea) - BiPAP at night or whenever she is sleeping   Sinus bradycardia Long-term history of chronic bradycardia per previous EKG review.  She is on both carvedilol (for heart failure) and propranolol (for tremors) that likely explains her bradycardia. Currently on Propranolol.  Persistent bradycardia and possible symptoms will decrease dose to 20 mg p.o. twice daily   Type 2 diabetes mellitus with diabetic neuropathy, without long-term current use of insulin (HCC) Peripheral Neuropathy - Hold home semaglutide - SSI, moderate. Cont Gabapentin.    Hx of P A fib On Coreg and Eliquis at home.  Coreg on hold Eliquis resumed  Morbid obesity BMI 54.  This complicates overall care and prognosis.      PT/OT= SNF  DVT prophylaxis: Eliquis Code Status full Family Communication:Husband [name] [phone] Disposition Plan: Status is: Inpatient Remains inpatient appropriate because: Acute metabolic encephalopathy, possible UTI   Level of care: Med-Surg  Consultants:  Pulmonary  Procedures:  None  Antimicrobials: Rocephin   Subjective: Seen and examined.  Slight lethargic but does answer questions appropriately.  Objective: Vitals:   05/26/22 2050 05/27/22 0351 05/27/22 0404 05/27/22 0759  BP:  125/72  118/68  Pulse:  (!) 57  68  Resp:  20  16  Temp:  97.8 F (36.6 C)  98.2 F (36.8 C)  TempSrc:  Oral  Oral  SpO2: 96% 96%  96%  Weight:    (!) 154.4 kg   Height:        Intake/Output Summary (Last 24 hours) at 05/27/2022 1501 Last data filed at 05/26/2022 2054 Gross per 24 hour  Intake 3 ml  Output --  Net 3 ml   Filed Weights   05/22/22 0500 05/26/22 0410 05/27/22 0404  Weight: (!) 154.3 kg (!) 154.4 kg (!) 154.4 kg    Examination:  General exam: No acute distress.  Lethargic Respiratory system: Scattered crackles bilaterally.  Normal work of breathing.  4 L Cardiovascular system: S1-S2, RRR, no murmurs, trace pedal edema Gastrointestinal system: Obese, soft, NT/ND, normal bowel sounds Central nervous system: Alert and oriented. No focal neurological deficits. Extremities:*Symmetrically bilaterally Skin: No rashes, lesions or ulcers Psychiatry: Judgement and insight appear normal. Mood & affect appropriate.     Data Reviewed: I have personally reviewed following labs and imaging studies  CBC: Recent Labs  Lab 05/20/22 1542 05/21/22 0432 05/23/22 0627 05/24/22 0549 05/25/22 0437 05/26/22 0619 05/27/22 0526  WBC 10.0   < > 11.7* 13.4* 12.1* 13.1* 13.4*  NEUTROABS 7.4  --   --   --   --   --   --   HGB 9.0*   < > 9.0* 9.4* 9.7* 9.3* 9.3*  HCT 32.8*   < > 33.4* 35.0* 36.8 35.2* 35.2*  MCV 77.4*   < >  78.0* 79.5* 79.7* 81.1 81.5  PLT 385   < > 408* 437* 441* 439* 459*   < > = values in this interval not displayed.   Basic Metabolic Panel: Recent Labs  Lab 05/23/22 0627 05/24/22 0549 05/25/22 0437 05/26/22 0619 05/27/22 0526  NA 141 142 142 142 142  K 4.2 4.5 4.7 4.7 5.0  CL 105 107 106 105 105  CO2 '31 30 30 29 30  '$ GLUCOSE 96 93 79 74 92  BUN 34* 31* 33* 33* 32*  CREATININE 1.21* 1.10* 1.36* 1.28* 1.19*  CALCIUM 8.4* 8.6* 8.6* 8.4* 8.7*  MG  --  2.4 2.5* 2.5* 2.6*   GFR: Estimated Creatinine Clearance: 69.5 mL/min (A) (by C-G formula based on SCr of 1.19 mg/dL (H)). Liver Function Tests: No results for input(s): "AST", "ALT", "ALKPHOS", "BILITOT", "PROT", "ALBUMIN" in the last 168  hours. No results for input(s): "LIPASE", "AMYLASE" in the last 168 hours. No results for input(s): "AMMONIA" in the last 168 hours. Coagulation Profile: No results for input(s): "INR", "PROTIME" in the last 168 hours. Cardiac Enzymes: No results for input(s): "CKTOTAL", "CKMB", "CKMBINDEX", "TROPONINI" in the last 168 hours. BNP (last 3 results) No results for input(s): "PROBNP" in the last 8760 hours. HbA1C: No results for input(s): "HGBA1C" in the last 72 hours. CBG: Recent Labs  Lab 05/24/22 1230 05/24/22 1633 05/24/22 2112 05/25/22 0819 05/25/22 1147  GLUCAP 97 84 102* 72 100*   Lipid Profile: No results for input(s): "CHOL", "HDL", "LDLCALC", "TRIG", "CHOLHDL", "LDLDIRECT" in the last 72 hours. Thyroid Function Tests: No results for input(s): "TSH", "T4TOTAL", "FREET4", "T3FREE", "THYROIDAB" in the last 72 hours. Anemia Panel: No results for input(s): "VITAMINB12", "FOLATE", "FERRITIN", "TIBC", "IRON", "RETICCTPCT" in the last 72 hours. Sepsis Labs: No results for input(s): "PROCALCITON", "LATICACIDVEN" in the last 168 hours.  Recent Results (from the past 240 hour(s))  Urine Culture     Status: Abnormal   Collection Time: 05/20/22  8:06 PM   Specimen: Urine, Random  Result Value Ref Range Status   Specimen Description   Final    URINE, RANDOM Performed at [facility], [address] [place], [place] [address]    Special Requests   Final    NONE Performed at [facility], [place], [place] [address]    Culture 30,000 COLONIES/mL ENTEROCOCCUS FAECALIS (A)  Final   Report Status 05/23/2022 FINAL  Final   Organism ID, Bacteria ENTEROCOCCUS FAECALIS (A)  Final      Susceptibility   Enterococcus faecalis - MIC*    AMPICILLIN <=2 SENSITIVE Sensitive     NITROFURANTOIN <=16 SENSITIVE Sensitive     VANCOMYCIN 1 SENSITIVE Sensitive     * 30,000 COLONIES/mL ENTEROCOCCUS FAECALIS         Radiology Studies: DG Chest Port 1  View  Result Date: 05/27/2022 CLINICAL DATA:  Hypoxia EXAM: PORTABLE CHEST 1 VIEW COMPARISON:  03/10/2022 FINDINGS: Cardiomegaly, vascular congestion. No definite overt edema or effusions. No acute bony abnormality. IMPRESSION: Cardiomegaly, vascular congestion Electronically Signed   By: Rolm Baptise M.D.   On: 05/27/2022 09:36        Scheduled Meds:  apixaban  5 mg Oral BID   atorvastatin  20 mg Oral Daily   buPROPion  150 mg Oral Daily   furosemide  40 mg Oral BID   gabapentin  200 mg Oral TID   levothyroxine  125 mcg Oral Q0600   multivitamin with minerals  1 tablet Oral Daily  pantoprazole  40 mg Oral Daily   propranolol  20 mg Oral BID   rOPINIRole  3 mg Oral QHS   sodium chloride flush  3 mL Intravenous Q12H   zinc oxide   Topical BID   Continuous Infusions:  cefTRIAXone (ROCEPHIN)  IV       LOS: 4 days       Sidney Ace, MD [facility]   If 7PM-7AM, please contact night-coverage  05/27/2022, 3:01 PM

## 2022-05-27 NOTE — Progress Notes (Signed)
Physical Therapy Treatment Patient Details Name: Madison Mcbride MRN: [id number] DOB: [birth year] Today's Date: 05/27/2022   History of Present Illness Pt is a 66 y.o. female presenting to hospital 05/20/22.  Per MD note "She is mostly sedentary at home, in recent days her bottom was having sores and patient is having burning and itching.  She also endorses fall 2 days PTA where her knees buckled-did not hit her head or had any injuries.  Complaining of mild dysuria recently."   Pt admitted with AKI, microcytic anemia, CHF, chronic hypoxic respiratory failure (on home O2), pressure ulcer of buttock, dysuria, chronic venous stasis dermatitis of B LE's, inflammatory arthritis, OSA, and sinus bradycardia.  PMH includes CHF, DM, lymphedema, RLS, sleep apnea, thyroid CA.    PT Comments    Pt struggled with most aspects of PT, she could not consistently keep her eyes open and when she did she showed poor general awareness.  She could not find/hold/open/use phone despite plenty of direct and indirect assist.  She was able to participate with some limited supine exercises and showed some intermittent effort, she was unable to functionally assist with attempt to get to sitting EOB, could not initiate movement and even with plenty of cuing and assist we could not get to upright sitting.   Pt's HR remained in the 40-50s range and O2 (on 3L) was 87-93%.   Recommendations for follow up therapy are one component of a multi-disciplinary discharge planning process, led by the attending physician.  Recommendations may be updated based on patient status, additional functional criteria and insurance authorization.  Follow Up Recommendations  Skilled nursing-short term rehab (<3 hours/day) Can patient physically be transported by private vehicle: No   Assistance Recommended at Discharge Frequent or constant Supervision/Assistance  Patient can return home with the following Two people to help with walking and/or  transfers;Two people to help with bathing/dressing/bathroom;Assistance with cooking/housework;Assist for transportation;Help with stairs or ramp for entrance   Equipment Recommendations  Rolling walker (2 wheels);Wheelchair (measurements PT);BSC/3in1;Wheelchair cushion (measurements PT);Hospital bed    Recommendations for Other Services       Precautions / Restrictions Precautions Precautions: Fall Restrictions Weight Bearing Restrictions: No     Mobility  Bed Mobility Overal bed mobility: Needs Assistance Bed Mobility: Supine to Sit     Supine to sit: Max assist     General bed mobility comments: Max assist with attempt to get up to sitting EOB, pt unable to give any real assistance at all and despite heavy assist we could not even attain upright, much less EOB sitting.  Further mobility deferred, pt's eyes close as soon as she was back down on the pillow    Transfers                        Ambulation/Gait                   Stairs             Wheelchair Mobility    Modified Rankin (Stroke Patients Only)       Balance                                            Cognition Arousal/Alertness: Lethargic Behavior During Therapy: Flat affect Overall Cognitive Status: Impaired/Different from baseline  General Comments: Unsure of her typical baseline but pt falling asleep/closing eyes consistently t/o session.  Showed general lack of awareness or ability to stay on task.  Eg. her cell phone rang, despite it being right next to her hand she fumbled and fumbled with it then finally grabbed it just to move it out of the way to continue looking for her phone - when cued about this she showed no awareness of what she had done, or really what she was even trying to do.  When PT suggested she try to see who had called she could hardly hold the phone much less actually open it/hit correct  buttons/etc        Exercises General Exercises - Lower Extremity Ankle Circles/Pumps: AROM, 5 reps Quad Sets: AROM, 5 reps Heel Slides: AAROM, 10 reps (lightly resisted leg ext per pt tolerance) Hip ABduction/ADduction: AAROM, 10 reps    General Comments General comments (skin integrity, edema, etc.): Pt lethargic, unable to grab/answer phone, even with plenty of cuing and some direct assist.  Pt closing eyes consistently t/o session showing global lack of awarness.      Pertinent Vitals/Pain Pain Assessment Faces Pain Scale: Hurts little more Pain Location: B LE's    Home Living                          Prior Function            PT Goals (current goals can now be found in the care plan section) Progress towards PT goals: Not progressing toward goals - comment (mental status, lethargy)    Frequency    Min 2X/week      PT Plan Current plan remains appropriate    Co-evaluation              AM-PAC PT "6 Clicks" Mobility   Outcome Measure  Help needed turning from your back to your side while in a flat bed without using bedrails?: Total Help needed moving from lying on your back to sitting on the side of a flat bed without using bedrails?: Total Help needed moving to and from a bed to a chair (including a wheelchair)?: Total Help needed standing up from a chair using your arms (e.g., wheelchair or bedside chair)?: Total Help needed to walk in hospital room?: Total Help needed climbing 3-5 steps with a railing? : Total 6 Click Score: 6    End of Session Equipment Utilized During Treatment: Oxygen Activity Tolerance: Patient limited by fatigue;Patient limited by lethargy Patient left: with call bell/phone within reach;with bed alarm set;with nursing/sitter in room Nurse Communication: Mobility status (O2, limited mobility/activity tolerance) PT Visit Diagnosis: Other abnormalities of gait and mobility (R26.89);Muscle weakness (generalized)  (M62.81);History of falling (Z91.81)     Time: 3295-1884 PT Time Calculation (min) (ACUTE ONLY): 27 min  Charges:  $Therapeutic Exercise: 8-22 mins $Therapeutic Activity: 8-22 mins                     Kreg Shropshire, DPT 05/27/2022, 5:09 PM

## 2022-05-27 NOTE — Progress Notes (Signed)
Pt placed on hospital bipap machine with home machine mask at this time 18/5 3L bled in.  Pt tolerating well.

## 2022-05-27 NOTE — Plan of Care (Signed)
Pt has increased alertness  this am. Wore trilogy bipap from 2050 to 0600 this am. Manus Rudd completed. Dsg changed on legs. Powdered and zinc applied.  Problem: Education: Goal: Ability to describe self-care measures that may prevent or decrease complications (Diabetes Survival Skills Education) will improve Outcome: Progressing Goal: Individualized Educational Video(s) Outcome: Progressing   Problem: Coping: Goal: Ability to adjust to condition or change in health will improve Outcome: Progressing   Problem: Fluid Volume: Goal: Ability to maintain a balanced intake and output will improve Outcome: Progressing   Problem: Health Behavior/Discharge Planning: Goal: Ability to identify and utilize available resources and services will improve Outcome: Progressing Goal: Ability to manage health-related needs will improve Outcome: Progressing   Problem: Metabolic: Goal: Ability to maintain appropriate glucose levels will improve Outcome: Progressing   Problem: Nutritional: Goal: Maintenance of adequate nutrition will improve Outcome: Progressing Goal: Progress toward achieving an optimal weight will improve Outcome: Progressing   Problem: Skin Integrity: Goal: Risk for impaired skin integrity will decrease Outcome: Progressing   Problem: Tissue Perfusion: Goal: Adequacy of tissue perfusion will improve Outcome: Progressing   Problem: Education: Goal: Knowledge of General Education information will improve Description: Including pain rating scale, medication(s)/side effects and non-pharmacologic comfort measures Outcome: Progressing   Problem: Health Behavior/Discharge Planning: Goal: Ability to manage health-related needs will improve Outcome: Progressing   Problem: Clinical Measurements: Goal: Ability to maintain clinical measurements within normal limits will improve Outcome: Progressing Goal: Will remain free from infection Outcome: Progressing Goal: Diagnostic test  results will improve Outcome: Progressing Goal: Respiratory complications will improve Outcome: Progressing Goal: Cardiovascular complication will be avoided Outcome: Progressing   Problem: Activity: Goal: Risk for activity intolerance will decrease Outcome: Progressing   Problem: Nutrition: Goal: Adequate nutrition will be maintained Outcome: Progressing   Problem: Coping: Goal: Level of anxiety will decrease Outcome: Progressing   Problem: Elimination: Goal: Will not experience complications related to bowel motility Outcome: Progressing Goal: Will not experience complications related to urinary retention Outcome: Progressing   Problem: Pain Managment: Goal: General experience of comfort will improve Outcome: Progressing   Problem: Safety: Goal: Ability to remain free from injury will improve Outcome: Progressing   Problem: Skin Integrity: Goal: Risk for impaired skin integrity will decrease Outcome: Progressing

## 2022-05-27 NOTE — Consult Note (Signed)
PULMONOLOGY         Date: 05/27/2022,   MRN# [id number] Madison Mcbride 1956/01/02     AdmissionWeight: (!) 144.2 kg                 CurrentWeight: (!) 154.4 kg  Referring physician: Dr. Darrick Meigs    CHIEF COMPLAINT:  Acute on chronic hypoxemic and hypercapnic respiratory failure   HISTORY OF PRESENT ILLNESS   This is a pleasant 66 year old female with a history significant for morbid obesity with a BMI over 55, chronic heart failure with preserved EF, thyroid cancer, restless leg syndrome, hypothyroidism, diabetes, obstructive sleep apnea, CKD, came into the hospital via emergency room due to worsening ulceration of the sacrum and gluteal region she was noted to have decubitus ulcer stage II-III.  Patient is sedentary mostly in recliner occasionally does get up to move around the house denies fevers chills.  Respiratory status seems to be at baseline with chronic hypoxemia and hypercapnia currently using NIV at home.  Does have episodes of disequilibrium and presyncope which is chronic.  Admits to noncompliance with oxygen while ambulatory at home.  Vital signs on arrival were tachypneic with respiratory rate of 24 afebrile normotensive and mildly bradycardic with a heart rate of 51 was able to be normoxic on 2 L.  Basic metabolic panel was remarkable for acute renal injury with a creatinine of 1.89 she was admitted for painful sacral decubitus and acute renal failure.  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  PAST MEDICAL HISTORY   Past Medical History:  Diagnosis Date   CHF (congestive heart failure) ([place])    Diabetes mellitus without complication (HCC)    Hypothyroidism    Lymphedema    RLS (restless legs syndrome)    Sleep apnea    Thyroid cancer ([place]) 2007     SURGICAL HISTORY   Past Surgical History:  Procedure Laterality Date   ABDOMINAL  HYSTERECTOMY     COLONOSCOPY WITH PROPOFOL N/A 08/26/2015   Procedure: COLONOSCOPY WITH PROPOFOL;  Surgeon: Manya Silvas, MD;  Location: [facility];  Service: Endoscopy;  Laterality: N/A;   THYROIDECTOMY       FAMILY HISTORY   Family History  Problem Relation Age of Onset   Breast cancer Neg Hx      SOCIAL HISTORY   Social History   Tobacco Use   Smoking status: Never   Smokeless tobacco: Never  Substance Use Topics   Alcohol use: No   Drug use: No     MEDICATIONS    Home Medication:    Current Medication:  Current Facility-Administered Medications:    acetaminophen (TYLENOL) tablet 650 mg, 650 mg, Oral, Q6H PRN, 650 mg at 05/25/22 0855 **OR** acetaminophen (TYLENOL) suppository 650 mg, 650 mg, Rectal, Q6H PRN, Jose Persia, MD   apixaban (ELIQUIS) tablet 5 mg, 5 mg, Oral, BID, Jose Persia, MD, 5 mg at 05/27/22 0909   atorvastatin (LIPITOR) tablet 20 mg, 20 mg, Oral, Daily, Jose Persia, MD, 20 mg at 05/27/22 0909   buPROPion (WELLBUTRIN XL) 24 hr tablet 150 mg, 150 mg, Oral, Daily, Jose Persia, MD, 150 mg at 05/27/22 0909   gabapentin (NEURONTIN) capsule 200 mg, 200 mg, Oral, TID, Jose Persia, MD, 200 mg at 05/27/22 0909   guaiFENesin (ROBITUSSIN) 100 MG/5ML liquid 5 mL, 5 mL, Oral, Q4H PRN, Amin, Ankit Chirag, MD   hydrALAZINE (APRESOLINE) injection 10  mg, 10 mg, Intravenous, Q4H PRN, Amin, Ankit Chirag, MD   ipratropium-albuterol (DUONEB) 0.5-2.5 (3) MG/3ML nebulizer solution 3 mL, 3 mL, Nebulization, Q4H PRN, Amin, Ankit Chirag, MD, 3 mL at 05/26/22 1005   levothyroxine (SYNTHROID) tablet 125 mcg, 125 mcg, Oral, Q0600, Kc, Ramesh, MD, 125 mcg at 05/27/22 0644   metoprolol tartrate (LOPRESSOR) injection 5 mg, 5 mg, Intravenous, Q4H PRN, Amin, Ankit Chirag, MD   multivitamin with minerals tablet 1 tablet, 1 tablet, Oral, Daily, Jose Persia, MD, 1 tablet at 05/27/22 0909   ondansetron (ZOFRAN) injection 4 mg, 4 mg, Intravenous, Q6H PRN,  Amin, Ankit Chirag, MD   pantoprazole (PROTONIX) EC tablet 40 mg, 40 mg, Oral, Daily, Jose Persia, MD, 40 mg at 05/27/22 0909   polyethylene glycol (MIRALAX / GLYCOLAX) packet 17 g, 17 g, Oral, Daily PRN, Jose Persia, MD   propranolol (INDERAL) tablet 20 mg, 20 mg, Oral, TID, Kc, Ramesh, MD, 20 mg at 05/27/22 0909   rOPINIRole (REQUIP) tablet 3 mg, 3 mg, Oral, QHS, Jose Persia, MD, 3 mg at 05/26/22 2050   senna-docusate (Senokot-S) tablet 1 tablet, 1 tablet, Oral, QHS PRN, Amin, Ankit Chirag, MD   sodium chloride flush (NS) 0.9 % injection 3 mL, 3 mL, Intravenous, Q12H, Jose Persia, MD, 3 mL at 05/27/22 0910   traZODone (DESYREL) tablet 50 mg, 50 mg, Oral, QHS PRN, Amin, Ankit Chirag, MD, 50 mg at 05/23/22 2147   zinc oxide 20 % ointment, , Topical, BID, Antonieta Pert, MD, Given at 05/27/22 0910    ALLERGIES   Patient has no known allergies.     REVIEW OF SYSTEMS    Review of Systems:  Gen:  Denies  fever, sweats, chills weigh loss  HEENT: Denies blurred vision, double vision, ear pain, eye pain, hearing loss, nose bleeds, sore throat Cardiac:  No dizziness, chest pain or heaviness, chest tightness,edema Resp:   reports cough, shortness of breath,wheezing, deinies hemoptysis,  Gi: Denies swallowing difficulty, stomach pain, nausea or vomiting, diarrhea, constipation, bowel incontinence Gu:  Denies bladder incontinence, burning urine Ext:   Denies Joint pain, stiffness or swelling Skin: Denies  skin rash, easy bruising or bleeding or hives Endoc:  Denies polyuria, polydipsia , polyphagia or weight change Psych:   Denies depression, insomnia or hallucinations   Other:  All other systems negative   VS: BP 118/68 (BP Location: Left Arm)   Pulse 68   Temp 98.2 F (36.8 C) (Oral)   Resp 16   Ht '5\' 6"'$  (1.676 m)   Wt (!) 154.4 kg   SpO2 96%   BMI 54.94 kg/m      PHYSICAL EXAM    GENERAL:Obese somnolent mild distress no fevers, chills,  HEAD: Normocephalic,  atraumatic.  EYES: Pupils equal, round, reactive to light. Extraocular muscles intact. No scleral icterus.  MOUTH: dry mucosal membrane. Dentition intact. No abscess noted.  EAR, NOSE, THROAT: Clear without exudates. No external lesions.  NECK: Supple. No thyromegaly. No nodules. No JVD.  PULMONARY: Decreased breath sounds bilaterally without wheezing or rhonchorous lung sounds CARDIOVASCULAR: S1 and S2. Regular rate and rhythm. No murmurs, rubs, or gallops. No edema. Pedal pulses 2+ bilaterally.  GASTROINTESTINAL: Soft, nontender, nondistended. No masses. Positive bowel sounds. No hepatosplenomegaly.  Obese abdomen MUSCULOSKELETAL: No swelling, clubbing, or +2 edema. Range of motion full in all extremities.  NEUROLOGIC:GCS9  SKIN: No ulceration, lesions, rashes, or cyanosis. Skin warm and dry. Turgor intact.  PSYCHIATRIC: Mood, affect is flat      IMAGING  DG Chest Port 1 View  Result Date: 05/27/2022 CLINICAL DATA:  Hypoxia EXAM: PORTABLE CHEST 1 VIEW COMPARISON:  03/10/2022 FINDINGS: Cardiomegaly, vascular congestion. No definite overt edema or effusions. No acute bony abnormality. IMPRESSION: Cardiomegaly, vascular congestion Electronically Signed   By: Rolm Baptise M.D.   On: 05/27/2022 09:36   US RENAL  Result Date: 05/20/2022 CLINICAL DATA:  Acute kidney injury EXAM: RENAL / URINARY TRACT ULTRASOUND COMPLETE COMPARISON:  04/11/2020 FINDINGS: Right Kidney: Renal measurements: 10.6 x 5.7 x 5.3 cm = volume: 169 mL. Echogenicity within normal limits. 14 x 10 x 10 mm upper pole simple cyst, unchanged, benign. No hydronephrosis. Left Kidney: Renal measurements: 10.1 x 5.4 x 4.8 cm = volume: 135 mL. Echogenicity within normal limits. No mass or hydronephrosis visualized. Bladder: Appears normal for degree of bladder distention. Other: None. IMPRESSION: 14 mm right upper pole simple cyst, unchanged, benign. No follow-up is recommended. No hydronephrosis. Electronically Signed   By: Julian Hy M.D.   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 -Due to thoracic restriction with obesity hypoventilation syndrome and obstructive sleep apnea overlap -Would like to perform spirometry with graph to evaluate residual lung function and stratification of ventilatory defect -That is post venous blood gas with acidemia consistent with hypercapnic respiratory failure -Patient would benefit from bariatric evaluation-we have previously reviewed this -Complicated by fluid shifts secondary to heart failure and renal impairment -Would benefit from occupational and physical therapy -RD nutritional evaluation for appropriate dietary changes with morbid obesity , hypothyroid and DM2   Urinary tract infection with dysuria  On rocephin   Acute on chronic diastolic CHF  Continue BIPAP and lasix with albumin   Acute on chronic renal insufficiency KDIGO stage II Dc nonessential nephrotoxins    Chronic iron deficiency anemai   - contributing to dyspnea - on venofer   GI/DVT ppx Protonix /heparin      Ottie Glazier, M.D.  Division of [place]

## 2022-05-27 NOTE — Progress Notes (Signed)
Heard from Dr. Priscella Mann, stated he would contact the husband later today after he had seen the patient. I called the husband back and told him Dr. Priscella Mann would call him sometime today after he had seen the patient.

## 2022-05-27 NOTE — Progress Notes (Signed)
Dr. Priscella Mann was updated regarding night shift nurses concerns regarding patient being lethargic confused.  Chest x ray ordered as well as ABG, Pulmonary consult also initiated.  Contacted husband Madison Mcbride regarding his concerns and also to update him regarding wife's treatment.  Respiratory, night shift nurse and charge nurse all notified that patient had to be on hospital bipap

## 2022-05-27 NOTE — TOC Progression Note (Addendum)
Transition of Care [facility]) - Progression Note    Patient Details  Name: Madison Mcbride MRN: [id number] Date of Birth: 1955-12-14  Transition [facility]) CM/SW [place], LCSW Phone Number: 05/27/2022, 8:34 AM  Clinical Narrative:   Josem Kaufmann approved but Josem Kaufmann number has not generated yet. Valid 11/14-11/16. It does not appear that patient wore bipap machine last night. Sent message to team to reiterate importance of her wearing it tonight so we can obtain settings for SNF to order bipap. Also called respiratory to explain situation and ensure she would wear bipap tonight, not her NIV.  10:08 am: Updated husband.   Expected Discharge Plan: [place] Barriers to Discharge: Continued Medical Work up, Ship broker  Expected Discharge Plan and Services Expected Discharge Plan: [place] In-house Referral: Clinical Social Work Discharge Planning Services: CM Consult Post Acute Care Choice: [place] arrangements for the past 2 months: Single Family Home                 DME Arranged: N/A DME Agency: NA       HH Arranged: RN, PT, OT, Speech Therapy HH Agency: [facility] Date [place]: 05/20/22 Time [place]: 1619 Representative spoke with at [place]: [place]   Social Determinants of Health ([place]) Interventions    Readmission Risk Interventions    03/07/2022    4:12 PM  Readmission Risk Prevention Plan  Transportation Screening Complete  PCP or Specialist Appt within 5-7 Days Complete  Home Care Screening Complete  Medication Review (RN CM) Complete

## 2022-05-27 NOTE — Progress Notes (Signed)
Contacted by patient's husband Ben by telephone. He stated the MD had called him and all the concerns he had earlier were addressed and he feel much less concerned and believes they are working through what is going on with his wife's health. He stated he was glad he had talked with me and felt like I had addressed his concerns quickly and completely and followed up as needed. I asked him to call the [facility] and told him who was here tonight and tomorrow, if he had further concerns.

## 2022-05-28 ENCOUNTER — Inpatient Hospital Stay: Payer: Medicare Other

## 2022-05-28 DIAGNOSIS — N179 Acute kidney failure, unspecified: Secondary | ICD-10-CM | POA: Diagnosis not present

## 2022-05-28 LAB — CBC
HCT: 33.8 % — ABNORMAL LOW (ref 36.0–46.0)
Hemoglobin: 9 g/dL — ABNORMAL LOW (ref 12.0–15.0)
MCH: 21.8 pg — ABNORMAL LOW (ref 26.0–34.0)
MCHC: 26.6 g/dL — ABNORMAL LOW (ref 30.0–36.0)
MCV: 81.8 fL (ref 80.0–100.0)
Platelets: 429 10*3/uL — ABNORMAL HIGH (ref 150–400)
RBC: 4.13 MIL/uL (ref 3.87–5.11)
RDW: 22.6 % — ABNORMAL HIGH (ref 11.5–15.5)
WBC: 11.4 10*3/uL — ABNORMAL HIGH (ref 4.0–10.5)
nRBC: 0.7 % — ABNORMAL HIGH (ref 0.0–0.2)

## 2022-05-28 LAB — BASIC METABOLIC PANEL
Anion gap: 9 (ref 5–15)
BUN: 33 mg/dL — ABNORMAL HIGH (ref 8–23)
CO2: 29 mmol/L (ref 22–32)
Calcium: 8.6 mg/dL — ABNORMAL LOW (ref 8.9–10.3)
Chloride: 105 mmol/L (ref 98–111)
Creatinine, Ser: 1.17 mg/dL — ABNORMAL HIGH (ref 0.44–1.00)
GFR, Estimated: 51 mL/min — ABNORMAL LOW (ref >60)
Glucose, Bld: 68 mg/dL — ABNORMAL LOW (ref 70–99)
Potassium: 4.6 mmol/L (ref 3.5–5.1)
Sodium: 143 mmol/L (ref 135–145)

## 2022-05-28 LAB — MAGNESIUM: Magnesium: 2.4 mg/dL (ref 1.7–2.4)

## 2022-05-28 MED ORDER — GADOBUTROL 1 MMOL/ML IV SOLN
10.0000 mL | Freq: Once | INTRAVENOUS | Status: AC | PRN
  Administered 2022-05-29: 10 mL via INTRAVENOUS

## 2022-05-28 NOTE — TOC Progression Note (Signed)
Transition of Care [facility]) - Progression Note    Patient Details  Name: Madison Mcbride MRN: [id number] Date of Birth: 08/08/1955  [facility]) CM/SW [place], LCSW Phone Number: 05/28/2022, 10:07 AM  Clinical Narrative:  Per MD, likely discharge tomorrow. Updated [facility] admissions assistant and told her that bipap settings are in. Insurance authorization valid through today but called [employer] and confirmed as long as patient admits by tomorrow at 11:59 pm we can still use current authorization.   Expected Discharge Plan: [place] Barriers to Discharge: Continued Medical Work up, Ship broker  Expected Discharge Plan and Services Expected Discharge Plan: [place] In-house Referral: Clinical Social Work Discharge Planning Services: CM Consult Post Acute Care Choice: [place] arrangements for the past 2 months: Single Family Home                 DME Arranged: N/A DME Agency: NA       HH Arranged: RN, PT, OT, Speech Therapy HH Agency: [place] Date [place]: 05/20/22 Time [place]: 1619 Representative spoke with at [place]: [place]   Social Determinants of Health ([place]) Interventions    Readmission Risk Interventions    03/07/2022    4:12 PM  Readmission Risk Prevention Plan  Transportation Screening Complete  PCP or Specialist Appt within 5-7 Days Complete  Home Care Screening Complete  Medication Review (RN CM) Complete

## 2022-05-28 NOTE — Progress Notes (Signed)
PULMONOLOGY         Date: 05/28/2022,   MRN# [id number] Madison Mcbride 12-08-1955     AdmissionWeight: (!) 144.2 kg                 CurrentWeight: (!) 154.4 kg  Referring physician: Dr. Darrick Meigs    CHIEF COMPLAINT:  Acute on chronic hypoxemic and hypercapnic respiratory failure   HISTORY OF PRESENT ILLNESS   This is a pleasant 66 year old female with a history significant for morbid obesity with a BMI over 55, chronic heart failure with preserved EF, thyroid cancer, restless leg syndrome, hypothyroidism, diabetes, obstructive sleep apnea, CKD, came into the hospital via emergency room due to worsening ulceration of the sacrum and gluteal region she was noted to have decubitus ulcer stage II-III.  Patient is sedentary mostly in recliner occasionally does get up to move around the house denies fevers chills.  Respiratory status seems to be at baseline with chronic hypoxemia and hypercapnia currently using NIV at home.  Does have episodes of disequilibrium and presyncope which is chronic.  Admits to noncompliance with oxygen while ambulatory at home.  Vital signs on arrival were tachypneic with respiratory rate of 24 afebrile normotensive and mildly bradycardic with a heart rate of 51 was able to be normoxic on 2 L.  Basic metabolic panel was remarkable for acute renal injury with a creatinine of 1.89 she was admitted for painful sacral decubitus and acute renal failure.  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  05/28/22- Patient is more lucid and alert, she is using her BIPAP.  I met with her husband and we discussed current care plan. She is improved and may be dcd home in am.   PAST MEDICAL HISTORY   Past Medical History:  Diagnosis Date   CHF (congestive heart failure) ([place])    Diabetes mellitus without complication (HCC)    Hypothyroidism    Lymphedema     RLS (restless legs syndrome)    Sleep apnea    Thyroid cancer ([place]) 2007     SURGICAL HISTORY   Past Surgical History:  Procedure Laterality Date   ABDOMINAL HYSTERECTOMY     COLONOSCOPY WITH PROPOFOL N/A 08/26/2015   Procedure: COLONOSCOPY WITH PROPOFOL;  Surgeon: Manya Silvas, MD;  Location: [place];  Service: Endoscopy;  Laterality: N/A;   THYROIDECTOMY       FAMILY HISTORY   Family History  Problem Relation Age of Onset   Breast cancer Neg Hx      SOCIAL HISTORY   Social History   Tobacco Use   Smoking status: Never   Smokeless tobacco: Never  Substance Use Topics   Alcohol use: No   Drug use: No     MEDICATIONS    Home Medication:    Current Medication:  Current Facility-Administered Medications:    acetaminophen (TYLENOL) tablet 650 mg, 650 mg, Oral, Q6H PRN, 650 mg at 05/28/22 0942 **OR** acetaminophen (TYLENOL) suppository 650 mg, 650 mg, Rectal, Q6H PRN, Jose Persia, MD   apixaban (ELIQUIS) tablet 5 mg, 5 mg, Oral, BID, Jose Persia, MD, 5 mg at 05/28/22 0941   atorvastatin (LIPITOR) tablet 20 mg, 20 mg, Oral, Daily, Jose Persia, MD, 20 mg at 05/28/22 0942   buPROPion (WELLBUTRIN XL) 24 hr tablet 150 mg, 150 mg, Oral, Daily, Jose Persia, MD, 150 mg at 05/28/22 0941   cefTRIAXone (ROCEPHIN) 1 g in  sodium chloride 0.9 % 100 mL IVPB, 1 g, Intravenous, Q24H, Sreenath, Sudheer B, MD, Stopped at 05/27/22 1700   furosemide (LASIX) tablet 40 mg, 40 mg, Oral, BID, Priscella Mann, Sudheer B, MD, 40 mg at 05/28/22 0940   gabapentin (NEURONTIN) capsule 200 mg, 200 mg, Oral, TID, Jose Persia, MD, 200 mg at 05/28/22 0950   guaiFENesin (ROBITUSSIN) 100 MG/5ML liquid 5 mL, 5 mL, Oral, Q4H PRN, Amin, Ankit Chirag, MD   hydrALAZINE (APRESOLINE) injection 10 mg, 10 mg, Intravenous, Q4H PRN, Amin, Ankit Chirag, MD   ipratropium-albuterol (DUONEB) 0.5-2.5 (3) MG/3ML nebulizer solution 3 mL, 3 mL, Nebulization, Q4H PRN, Amin, Ankit Chirag, MD, 3 mL  at 05/26/22 1005   levothyroxine (SYNTHROID) tablet 125 mcg, 125 mcg, Oral, Q0600, Kc, Ramesh, MD, 125 mcg at 05/28/22 0516   metoprolol tartrate (LOPRESSOR) injection 5 mg, 5 mg, Intravenous, Q4H PRN, Amin, Ankit Chirag, MD   multivitamin with minerals tablet 1 tablet, 1 tablet, Oral, Daily, Jose Persia, MD, 1 tablet at 05/28/22 0953   ondansetron (ZOFRAN) injection 4 mg, 4 mg, Intravenous, Q6H PRN, Amin, Ankit Chirag, MD   pantoprazole (PROTONIX) EC tablet 40 mg, 40 mg, Oral, Daily, Jose Persia, MD, 40 mg at 05/28/22 0940   polyethylene glycol (MIRALAX / GLYCOLAX) packet 17 g, 17 g, Oral, Daily PRN, Jose Persia, MD   propranolol (INDERAL) tablet 20 mg, 20 mg, Oral, BID, Sreenath, Sudheer B, MD, 20 mg at 05/28/22 0943   rOPINIRole (REQUIP) tablet 3 mg, 3 mg, Oral, QHS, Jose Persia, MD, 3 mg at 05/27/22 2236   senna-docusate (Senokot-S) tablet 1 tablet, 1 tablet, Oral, QHS PRN, Amin, Ankit Chirag, MD   sodium chloride flush (NS) 0.9 % injection 3 mL, 3 mL, Intravenous, Q12H, Jose Persia, MD, 3 mL at 05/28/22 0953   traZODone (DESYREL) tablet 50 mg, 50 mg, Oral, QHS PRN, Amin, Ankit Chirag, MD, 50 mg at 05/23/22 2147   zinc oxide 20 % ointment, , Topical, BID, Antonieta Pert, MD, Given at 05/27/22 2236    ALLERGIES   Patient has no known allergies.     REVIEW OF SYSTEMS    Review of Systems:  Gen:  Denies  fever, sweats, chills weigh loss  HEENT: Denies blurred vision, double vision, ear pain, eye pain, hearing loss, nose bleeds, sore throat Cardiac:  No dizziness, chest pain or heaviness, chest tightness,edema Resp:   reports cough, shortness of breath,wheezing, deinies hemoptysis,  Gi: Denies swallowing difficulty, stomach pain, nausea or vomiting, diarrhea, constipation, bowel incontinence Gu:  Denies bladder incontinence, burning urine Ext:   Denies Joint pain, stiffness or swelling Skin: Denies  skin rash, easy bruising or bleeding or hives Endoc:  Denies  polyuria, polydipsia , polyphagia or weight change Psych:   Denies depression, insomnia or hallucinations   Other:  All other systems negative   VS: BP (!) 101/50 (BP Location: Right Arm)   Pulse 64   Temp 98.7 F (37.1 C) (Axillary)   Resp 18   Ht _0  (1.676 m)   Wt (!) 154.4 kg   SpO2 95%   BMI 54.94 kg/m      PHYSICAL EXAM    GENERAL:Obese somnolent mild distress no fevers, chills,  HEAD: Normocephalic, atraumatic.  EYES: Pupils equal, round, reactive to light. Extraocular muscles intact. No scleral icterus.  MOUTH: dry mucosal membrane. Dentition intact. No abscess noted.  EAR, NOSE, THROAT: Clear without exudates. No external lesions.  NECK: Supple. No thyromegaly. No nodules. No JVD.  PULMONARY: Decreased breath sounds  bilaterally without wheezing or rhonchorous lung sounds CARDIOVASCULAR: S1 and S2. Regular rate and rhythm. No murmurs, rubs, or gallops. No edema. Pedal pulses 2+ bilaterally.  GASTROINTESTINAL: Soft, nontender, nondistended. No masses. Positive bowel sounds. No hepatosplenomegaly.  Obese abdomen MUSCULOSKELETAL: No swelling, clubbing, or +2 edema. Range of motion full in all extremities.  NEUROLOGIC:GCS9  SKIN: No ulceration, lesions, rashes, or cyanosis. Skin warm and dry. Turgor intact.  PSYCHIATRIC: Mood, affect is flat      IMAGING    DG Chest Port 1 View  Result Date: 05/27/2022 CLINICAL DATA:  Hypoxia EXAM: PORTABLE CHEST 1 VIEW COMPARISON:  03/10/2022 FINDINGS: Cardiomegaly, vascular congestion. No definite overt edema or effusions. No acute bony abnormality. IMPRESSION: Cardiomegaly, vascular congestion Electronically Signed   By: Rolm Baptise M.D.   On: 05/27/2022 09:36   US RENAL  Result Date: 05/20/2022 CLINICAL DATA:  Acute kidney injury EXAM: RENAL / URINARY TRACT ULTRASOUND COMPLETE COMPARISON:  04/11/2020 FINDINGS: Right Kidney: Renal measurements: 10.6 x 5.7 x 5.3 cm = volume: 169 mL. Echogenicity within normal limits. 14 x 10  x 10 mm upper pole simple cyst, unchanged, benign. No hydronephrosis. Left Kidney: Renal measurements: 10.1 x 5.4 x 4.8 cm = volume: 135 mL. Echogenicity within normal limits. No mass or hydronephrosis visualized. Bladder: Appears normal for degree of bladder distention. Other: None. IMPRESSION: 14 mm right upper pole simple cyst, unchanged, benign. No follow-up is recommended. No hydronephrosis. Electronically Signed   By: Julian Hy M.D.   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 -Due to thoracic restriction with obesity hypoventilation syndrome and obstructive sleep apnea overlap -Would like to perform spirometry with graph to evaluate residual lung function and stratification of ventilatory defect -That is post venous blood gas with acidemia consistent with hypercapnic respiratory failure -Patient would benefit from bariatric evaluation-we have previously reviewed this -Complicated by fluid shifts secondary to heart failure and renal impairment -Would benefit from occupational and physical therapy -RD nutritional evaluation for appropriate dietary changes with morbid obesity , hypothyroid and DM2   Urinary tract infection with dysuria  On rocephin   Acute on chronic diastolic CHF  Continue BIPAP and lasix with albumin   Acute on chronic renal insufficiency KDIGO stage II Dc nonessential nephrotoxins    Chronic iron deficiency anemai   - contributing to dyspnea - on venofer   GI/DVT ppx Protonix /heparin      Ottie Glazier, M.D.  Division of [place]

## 2022-05-28 NOTE — Progress Notes (Signed)
PROGRESS NOTE    Madison Mcbride  FTD:[id number] DOB: 08-Dec-1955 DOA: 05/20/2022 PCP: Rusty Aus, MD    Brief Narrative:  66 y.o.f w/ hx of  chronic hypoxic respiratory failure on 4 L [place] with ambulation, HFpEF (LVEF 45-50%), OSA on BiPAP, atrial fibrillation, T2DM,hypothyroidism presented with buttock sores. She is mostly sedentary at home, in recent days her bottom was having sores and patient is having burning and itching.  She also endorses fall 2 days PTA where her knees buckled-did not hit her head or had any injuries. Complaining of mild dysuria recently. In the ED vitals stable saturating 98% 2 L,Lab work showed elevated creatinine 1.8 and admitted for AKI. Also found to have iron def anemia and UTI. Ucx grew Enterococcus faecalis.  PT OT has recommended SNF   11/15: Patient is overall improving but has been intermittently lethargic with altered mentation and weakness.  Per therapy the symptoms were significantly worse on 11/14.  On 11/15 venous blood gas demonstrates mild respiratory acidosis indicative of chronic retention.  Pulmonary consultation requested.  Case discussed with Dr. Lanney Gins, who is familiar with the patient from clinic.    Chest x-ray done this morning demonstrates pulmonary venous congestion indicative of mildly decompensated heart failure.  Restarted diuretics.  Unable to exclude urinary tract infection.  Given mild leukocytosis and altered mentation will treat empirically with intravenous Rocephin.  11/17: mental status seems to be improving, but not yet at baseline  Assessment & Plan:   Principal Problem:   Acute kidney injury ([place]) Active Problems:   Microcytic anemia   Chronic hypoxic respiratory failure, on home oxygen therapy (HCC)   Congestive heart failure (CHF) (HCC)   Pressure ulcer of buttock   Dysuria   Chronic venous stasis dermatitis of both lower extremities   Inflammatory arthritis   OSA (obstructive sleep apnea)   Type 2 diabetes  mellitus with diabetic neuropathy, without long-term current use of insulin (HCC)   Sinus bradycardia  Acute metabolic encephalopathy [name] has been concerned about increasing lethargy.  Patient falls asleep apparently easily.  Unable to hold conversation.  This is likely multifactorial in origin given to chronic CO2 narcosis.  Unable to exclude recurrent urinary tract infection given many bacteria in urine. Plan: BiPAP nightly and during naps, ensure this is always being done Check urine culture, NGTD Continue empiric Rocephin, dose 2/3 Delirium precautions Check MRI brain  * Acute kidney injury ([place]) Baseline 0.9, admission Cr 1.9.  Creatinine improved, 1.2 Creatinine 1.19 on 11/15 Suspect multifactorial etiology to acute kidney injury, unable to exclude ATN versus period of ischemia - Kidney function improving over interval Plan: Continue BID lasix Recheck creat in AM   Microcytic anemia Iron Deficiency Iron Def, Last C scope 2017, should follow up outpatient. No gross bleeding.  Received 5 doses of IV Venofer 200 mg Recommend p.o. iron at discharge Outpatient follow-up with GI for screening colonoscopy  Chronic hypoxic respiratory failure, on home oxygen therapy ([place]) Cont Home O2. Supportive care. Was discharged on 4L [place] in Sept 2023.      Acute on chronic systolic heart failure History of CHF with EF of 45-50% on carvedilol and Lasix.  Unclear whether weights are accurate however patient is apparently up 20+ pounds since 11/8.  Chest x-ray on 11/15 demonstrates pulmonary venous congestion.  Per patient and husband she is having increasing shortness of breath. Lasix 40 PO BID restarted 11/15.  Will continue.  Monitor strict I/O   Pressure ulcer of buttock -  Encouraged to frequently rotate position at home when on her recliner   UTI due to enterococcus faecalis.  Symptomatic with dysuria. S/p One dose of fosfomycin 11/11 Possible recurrent UTI Check urine  culture, NGTD Continue empiric Rocephin   Chronic venous stasis dermatitis of both lower extremities Lower extremity edema that is nonpitting in nature with hyperpigmentation consistent with chronic venous stasis.  Patient states symptoms are improving.  No evidence of acute infection.  Patient would like to hold off on Unna boots at this time. Elevated legs whenever possible.    Inflammatory arthritis Per chart review, patient followed up with her PCP for concern of inflammatory arthritis.  Anti-CCP markedly elevated.  She was referred to rheumatology but has not followed up.  Given patient has had increasing falls, this would be prudent to address.   - Outpatient rheumatology follow-up   OSA (obstructive sleep apnea) - BiPAP at night or whenever she is sleeping   Sinus bradycardia Long-term history of chronic bradycardia per previous EKG review.  She is on both carvedilol (for heart failure) and propranolol (for tremors) that likely explains her bradycardia. Currently on Propranolol.  Persistent bradycardia and possible symptoms will decrease dose to 20 mg p.o. twice daily   Type 2 diabetes mellitus with diabetic neuropathy, without long-term current use of insulin (HCC) Peripheral Neuropathy - Hold home semaglutide - SSI, moderate. Cont Gabapentin at reduced dose   Hx of P A fib On Coreg and Eliquis at home.  Coreg on hold Eliquis resumed  Morbid obesity BMI 54.  This complicates overall care and prognosis.      PT/OT= SNF  DVT prophylaxis: Eliquis Code Status full Family Communication:Husband [name] [phone] on 11/15; 11/16 Disposition Plan: Status is: Inpatient Remains inpatient appropriate because: Acute metabolic encephalopathy, possible UTI   Level of care: Med-Surg  Consultants:  Pulmonary  Procedures:  None  Antimicrobials: Rocephin   Subjective: Seen and examined.  Slight lethargic but does answer questions appropriately.  Objective: Vitals:    05/28/22 0814 05/28/22 0943 05/28/22 0949 05/28/22 1554  BP: (!) 101/50   (!) 112/57  Pulse: (!) 53 63 64 (!) 59  Resp: 18   16  Temp: 98.7 F (37.1 C)   98.2 F (36.8 C)  TempSrc: Axillary   Oral  SpO2: (!) 85%  95% 99%  Weight:      Height:        Intake/Output Summary (Last 24 hours) at 05/28/2022 1604 Last data filed at 05/28/2022 0000 Gross per 24 hour  Intake 130 ml  Output 1250 ml  Net -1120 ml   Filed Weights   05/22/22 0500 05/26/22 0410 05/27/22 0404  Weight: (!) 154.3 kg (!) 154.4 kg (!) 154.4 kg    Examination:  General exam: No acute distress.  More awake this morning Respiratory system: Scattered crackles bilaterally.  Normal work of breathing.  4 L Cardiovascular system: S1-S2, RRR, no murmurs, trace pedal edema Gastrointestinal system: Obese, soft, NT/ND, normal bowel sounds Central nervous system: Alert and oriented. No focal neurological deficits. Extremities:*Symmetrically bilaterally reduced power, BL venous stasis dermatitis Skin: No rashes, lesions or ulcers Psychiatry: Judgement and insight appear normal. Mood & affect appropriate.     Data Reviewed: I have personally reviewed following labs and imaging studies  CBC: Recent Labs  Lab 05/24/22 0549 05/25/22 0437 05/26/22 0619 05/27/22 0526 05/28/22 0444  WBC 13.4* 12.1* 13.1* 13.4* 11.4*  HGB 9.4* 9.7* 9.3* 9.3* 9.0*  HCT 35.0* 36.8 35.2* 35.2* 33.8*  MCV 79.5*  79.7* 81.1 81.5 81.8  PLT 437* 441* 439* 459* 630*   Basic Metabolic Panel: Recent Labs  Lab 05/24/22 0549 05/25/22 0437 05/26/22 0619 05/27/22 0526 05/28/22 0444  NA 142 142 142 142 143  K 4.5 4.7 4.7 5.0 4.6  CL 107 106 105 105 105  CO2 '30 30 29 30 29  '$ GLUCOSE 93 79 74 92 68*  BUN 31* 33* 33* 32* 33*  CREATININE 1.10* 1.36* 1.28* 1.19* 1.17*  CALCIUM 8.6* 8.6* 8.4* 8.7* 8.6*  MG 2.4 2.5* 2.5* 2.6* 2.4   GFR: Estimated Creatinine Clearance: 70.7 mL/min (A) (by C-G formula based on SCr of 1.17 mg/dL (H)). Liver  Function Tests: No results for input(s): "AST", "ALT", "ALKPHOS", "BILITOT", "PROT", "ALBUMIN" in the last 168 hours. No results for input(s): "LIPASE", "AMYLASE" in the last 168 hours. No results for input(s): "AMMONIA" in the last 168 hours. Coagulation Profile: No results for input(s): "INR", "PROTIME" in the last 168 hours. Cardiac Enzymes: No results for input(s): "CKTOTAL", "CKMB", "CKMBINDEX", "TROPONINI" in the last 168 hours. BNP (last 3 results) No results for input(s): "PROBNP" in the last 8760 hours. HbA1C: No results for input(s): "HGBA1C" in the last 72 hours. CBG: Recent Labs  Lab 05/24/22 1230 05/24/22 1633 05/24/22 2112 05/25/22 0819 05/25/22 1147  GLUCAP 97 84 102* 72 100*   Lipid Profile: No results for input(s): "CHOL", "HDL", "LDLCALC", "TRIG", "CHOLHDL", "LDLDIRECT" in the last 72 hours. Thyroid Function Tests: No results for input(s): "TSH", "T4TOTAL", "FREET4", "T3FREE", "THYROIDAB" in the last 72 hours. Anemia Panel: No results for input(s): "VITAMINB12", "FOLATE", "FERRITIN", "TIBC", "IRON", "RETICCTPCT" in the last 72 hours. Sepsis Labs: No results for input(s): "PROCALCITON", "LATICACIDVEN" in the last 168 hours.  Recent Results (from the past 240 hour(s))  Urine Culture     Status: Abnormal   Collection Time: 05/20/22  8:06 PM   Specimen: Urine, Random  Result Value Ref Range Status   Specimen Description   Final    URINE, RANDOM Performed at [facility], [address], [place], [place] [address]    Special Requests   Final    NONE Performed at [facility], [place], [place] [address]    Culture 30,000 COLONIES/mL ENTEROCOCCUS FAECALIS (A)  Final   Report Status 05/23/2022 FINAL  Final   Organism ID, Bacteria ENTEROCOCCUS FAECALIS (A)  Final      Susceptibility   Enterococcus faecalis - MIC*    AMPICILLIN <=2 SENSITIVE Sensitive     NITROFURANTOIN <=16 SENSITIVE Sensitive     VANCOMYCIN 1 SENSITIVE  Sensitive     * 30,000 COLONIES/mL ENTEROCOCCUS FAECALIS         Radiology Studies: DG Chest Port 1 View  Result Date: 05/27/2022 CLINICAL DATA:  Hypoxia EXAM: PORTABLE CHEST 1 VIEW COMPARISON:  03/10/2022 FINDINGS: Cardiomegaly, vascular congestion. No definite overt edema or effusions. No acute bony abnormality. IMPRESSION: Cardiomegaly, vascular congestion Electronically Signed   By: Rolm Baptise M.D.   On: 05/27/2022 09:36        Scheduled Meds:  apixaban  5 mg Oral BID   atorvastatin  20 mg Oral Daily   buPROPion  150 mg Oral Daily   furosemide  40 mg Oral BID   gabapentin  200 mg Oral TID   levothyroxine  125 mcg Oral Q0600   multivitamin with minerals  1 tablet Oral Daily   pantoprazole  40 mg Oral Daily   propranolol  20 mg Oral BID   rOPINIRole  3 mg Oral QHS   sodium chloride flush  3 mL Intravenous Q12H   zinc oxide   Topical BID   Continuous Infusions:  cefTRIAXone (ROCEPHIN)  IV Stopped (05/27/22 1700)     LOS: 5 days       Sidney Ace, MD [facility]   If 7PM-7AM, please contact night-coverage  05/28/2022, 4:04 PM

## 2022-05-28 NOTE — Progress Notes (Signed)
Occupational Therapy Treatment Patient Details Name: Madison Mcbride MRN: [id number] DOB: [birth year] Today's Date: 05/28/2022   History of present illness Pt is a 66 y.o. female presenting to hospital 05/20/22.  Per MD note "She is mostly sedentary at home, in recent days her bottom was having sores and patient is having burning and itching.  She also endorses fall 2 days PTA where her knees buckled-did not hit her head or had any injuries.  Complaining of mild dysuria recently."   Pt admitted with AKI, microcytic anemia, CHF, chronic hypoxic respiratory failure (on home O2), pressure ulcer of buttock, dysuria, chronic venous stasis dermatitis of B LE's, inflammatory arthritis, OSA, and sinus bradycardia.  PMH includes CHF, DM, lymphedema, RLS, sleep apnea, thyroid CA.   OT comments  Patient in bed supine with nursing staff in room. Agreeable to OT session. Upon arrival, patient SP02 at 83% on 3L SP02 nonsymptomatic, increased to 5L SP02 at 93%, nurse notified. Patient very lethargic during session, required VC to keep eyes open and observed having difficulty holding her phone. Frequent VC for proper breathing techniques. Patient completed UB strengthening exercises 10x each (shoulder raises, diagonal shoulder raise with LUE) Patient refused to continue exercises stating " I can't do it anymore." Patient educated on completing UB strengthening exercises to increase overall independence. Patient left in bed with call bell in reach, bed alarm set, and all needs met.      Recommendations for follow up therapy are one component of a multi-disciplinary discharge planning process, led by the attending physician.  Recommendations may be updated based on patient status, additional functional criteria and insurance authorization.    Follow Up Recommendations  Skilled nursing-short term rehab (<3 hours/day)     Assistance Recommended at Discharge Intermittent Supervision/Assistance  Patient can return  home with the following  A lot of help with walking and/or transfers;A lot of help with bathing/dressing/bathroom;Assistance with cooking/housework;Help with stairs or ramp for entrance;Assist for transportation   Equipment Recommendations  Other (comment) (Defer to next venue of care.)       Precautions / Restrictions Precautions Precautions: Fall Restrictions Weight Bearing Restrictions: No       Mobility Bed Mobility               General bed mobility comments: deferred due to decreased SP02    Transfers                   General transfer comment: deferred due to decreased SP02     Balance       Sitting balance - Comments: deferred due to decreased SP02       Standing balance comment: deferred due to decreased SP02                           ADL either performed or assessed with clinical judgement   ADL       Grooming: Wash/dry face;Set up;Bed level                                      Extremity/Trunk Assessment Upper Extremity Assessment Upper Extremity Assessment: Generalized weakness   Lower Extremity Assessment Lower Extremity Assessment: Generalized weakness        Vision Patient Visual Report: No change from baseline            Cognition Arousal/Alertness: Lethargic Behavior During Therapy: Flat  affect Overall Cognitive Status: Impaired/Different from baseline                                                     Pertinent Vitals/ Pain       Pain Assessment Pain Assessment: No/denies pain   Frequency  Min 2X/week        Progress Toward Goals  OT Goals(current goals can now be found in the care plan section)     Acute Rehab OT Goals Patient Stated Goal: get better OT Goal Formulation: With patient Time For Goal Achievement: 06/05/22 Potential to Achieve Goals: Fair   AM-PAC OT "6 Clicks" Daily Activity     Outcome Measure   Help from another person eating  meals?: None Help from another person taking care of personal grooming?: A Little Help from another person toileting, which includes using toliet, bedpan, or urinal?: Total Help from another person bathing (including washing, rinsing, drying)?: A Lot Help from another person to put on and taking off regular upper body clothing?: A Little Help from another person to put on and taking off regular lower body clothing?: Total 6 Click Score: 14    End of Session Equipment Utilized During Treatment: Oxygen  OT Visit Diagnosis: Unsteadiness on feet (R26.81);Repeated falls (R29.6);Muscle weakness (generalized) (M62.81)   Activity Tolerance Patient limited by fatigue   Patient Left in bed;with call bell/phone within reach;with bed alarm set   Nurse Communication Mobility status        Time: 2929-0903 OT Time Calculation (min): 21 min  Charges: OT General Charges $OT Visit: 1 Visit OT Treatments $Therapeutic Activity: 8-22 mins    Rishabh Rinkenberger, OTS 05/28/2022, 3:23 PM

## 2022-05-29 DIAGNOSIS — N179 Acute kidney failure, unspecified: Secondary | ICD-10-CM | POA: Diagnosis not present

## 2022-05-29 LAB — CBC
HCT: 34.5 % — ABNORMAL LOW (ref 36.0–46.0)
Hemoglobin: 9.2 g/dL — ABNORMAL LOW (ref 12.0–15.0)
MCH: 21.6 pg — ABNORMAL LOW (ref 26.0–34.0)
MCHC: 26.7 g/dL — ABNORMAL LOW (ref 30.0–36.0)
MCV: 81.2 fL (ref 80.0–100.0)
Platelets: 405 10*3/uL — ABNORMAL HIGH (ref 150–400)
RBC: 4.25 MIL/uL (ref 3.87–5.11)
RDW: 23.4 % — ABNORMAL HIGH (ref 11.5–15.5)
WBC: 11 10*3/uL — ABNORMAL HIGH (ref 4.0–10.5)
nRBC: 0.3 % — ABNORMAL HIGH (ref 0.0–0.2)

## 2022-05-29 LAB — BASIC METABOLIC PANEL
Anion gap: 7 (ref 5–15)
BUN: 29 mg/dL — ABNORMAL HIGH (ref 8–23)
CO2: 33 mmol/L — ABNORMAL HIGH (ref 22–32)
Calcium: 8.3 mg/dL — ABNORMAL LOW (ref 8.9–10.3)
Chloride: 105 mmol/L (ref 98–111)
Creatinine, Ser: 1.14 mg/dL — ABNORMAL HIGH (ref 0.44–1.00)
GFR, Estimated: 52 mL/min — ABNORMAL LOW (ref >60)
Glucose, Bld: 75 mg/dL (ref 70–99)
Potassium: 4.2 mmol/L (ref 3.5–5.1)
Sodium: 145 mmol/L (ref 135–145)

## 2022-05-29 LAB — MAGNESIUM: Magnesium: 2.1 mg/dL (ref 1.7–2.4)

## 2022-05-29 MED ORDER — ALPRAZOLAM 0.25 MG PO TABS: 0.2500 mg | ORAL_TABLET | Freq: Three times a day (TID) | ORAL | 0 refills | Status: AC | PRN

## 2022-05-29 MED ORDER — IRON (FERROUS SULFATE) 325 (65 FE) MG PO TABS: 325.0000 mg | ORAL_TABLET | Freq: Every day | ORAL | Status: AC

## 2022-05-29 MED ORDER — PROPRANOLOL HCL 20 MG PO TABS: 20.0000 mg | ORAL_TABLET | Freq: Two times a day (BID) | ORAL | Status: DC

## 2022-05-29 MED ORDER — GABAPENTIN 100 MG PO CAPS: 200.0000 mg | ORAL_CAPSULE | Freq: Three times a day (TID) | ORAL | Status: AC

## 2022-05-29 NOTE — TOC Transition Note (Signed)
Transition of Care [facility]) - CM/SW Discharge Note   Patient Details  Name: Madison Mcbride MRN: [id number] Date of Birth: [birth year]  Transition of [facility]) CM/SW Contact:  Candie Chroman, LCSW Phone Number: 05/29/2022, 11:56 AM   Clinical Narrative:  Patient has orders to discharge to [facility] today. RN will call report to [phone] (Room 602B). EMS transport has been arranged for 1:00 but due  to availability they will likely be here after that. No further concerns. CSW signing off.   Final next level of care: Skilled Nursing Facility Barriers to Discharge: Barriers Resolved   Patient Goals and CMS Choice Patient states their goals for this hospitalization and ongoing recovery are:: agrees with returning home with home health CMS Medicare.gov Compare Post Acute Care list provided to:: Patient Choice offered to / list presented to : Patient  Discharge Placement   Existing PASRR number confirmed : 05/23/22          Patient chooses bed at: [facility] [place] Patient to be transferred to facility by: EMS Name of family member notified: [name] and Milinda Sweeney Patient and family notified of of transfer: 05/29/22  Discharge Plan and Services In-house Referral: Clinical Social Work Discharge Planning Services: [employer] Consult Post Acute Care Choice: Home Health          DME Arranged: N/A DME Agency: NA       HH Arranged: RN, PT, OT, Speech Therapy HH Agency: [place] Date [place]: 05/20/22 Time [place]: [id number] Representative spoke with at [place]: [place]  Social Determinants of Health ([place]) Interventions     Readmission Risk Interventions    03/07/2022    4:12 PM  Readmission Risk Prevention Plan  Transportation Screening Complete  PCP or Specialist Appt within 5-7 Days Complete  Home Care Screening Complete  Medication Review (RN CM) Complete

## 2022-05-29 NOTE — Care Management Important Message (Signed)
Important Message  Patient Details  Name: Madison Mcbride MRN: [id number] Date of Birth: [birth year]   Medicare Important Message Given:  Yes     Dannette Barbara 05/29/2022, 11:25 AM

## 2022-05-29 NOTE — Discharge Summary (Signed)
Physician Discharge Summary  Madison Mcbride LJQ:[id number] DOB: Nov 22, 1955 DOA: 05/20/2022  PCP: Rusty Aus, MD  Admit date: 05/20/2022 Discharge date: 05/29/2022  Admitted From: Home Disposition:  SNF  Recommendations for Outpatient Follow-up:  Follow up with PCP in 1-2 weeks Follow up as directed with Dr. Lanney Gins, pulmonary  Home Health:No Equipment/Devices:Oxygen 4L via [place]   Discharge Condition:Stable CODE STATUS:FULL  Diet recommendation: Heart healthy  Brief/Interim Summary:  66 y.o.f w/ hx of  chronic hypoxic respiratory failure on 4 L [place] with ambulation, HFpEF (LVEF 45-50%), OSA on BiPAP, atrial fibrillation, T2DM,hypothyroidism presented with buttock sores. She is mostly sedentary at home, in recent days her bottom was having sores and patient is having burning and itching.  She also endorses fall 2 days PTA where her knees buckled-did not hit her head or had any injuries. Complaining of mild dysuria recently. In the ED vitals stable saturating 98% 2 L,Lab work showed elevated creatinine 1.8 and admitted for AKI. Also found to have iron def anemia and UTI. Ucx grew Enterococcus faecalis.  PT OT has recommended SNF    11/15: Patient is overall improving but has been intermittently lethargic with altered mentation and weakness.  Per therapy the symptoms were significantly worse on 11/14.  On 11/15 venous blood gas demonstrates mild respiratory acidosis indicative of chronic retention.  Pulmonary consultation requested.  Case discussed with Dr. Lanney Gins, who is familiar with the patient from clinic.     Chest x-ray done this morning demonstrates pulmonary venous congestion indicative of mildly decompensated heart failure.  Restarted diuretics.  Unable to exclude urinary tract infection.  Given mild leukocytosis and altered mentation will treat empirically with intravenous Rocephin.   11/16: mental status seems to be improving, but not yet at baseline 11/17: Mental status  at baseline.  HR improved, but still on the low side.  Patient was on 2 beta blockers as outpatient.  At time of dc, will discontinue Coreg, cotinue propranolol at reduced dose of 20 BID.  Patient will need follow up with PCP and cardiology to determine optimal beta blocker regimen as she does have a heart failure history.   Discharge Diagnoses:  Principal Problem:   Acute kidney injury ([place]) Active Problems:   Microcytic anemia   Chronic hypoxic respiratory failure, on home oxygen therapy (HCC)   Congestive heart failure (CHF) (HCC)   Pressure ulcer of buttock   Dysuria   Chronic venous stasis dermatitis of both lower extremities   Inflammatory arthritis   OSA (obstructive sleep apnea)   Type 2 diabetes mellitus with diabetic neuropathy, without long-term current use of insulin (HCC)   Sinus bradycardia  Acute metabolic encephalopathy [name] has been concerned about increasing lethargy.  Patient falls asleep apparently easily.  Unable to hold conversation.  This is likely multifactorial in origin given to chronic CO2 narcosis.  Unable to exclude recurrent urinary tract infection given many bacteria in urine. - MRI brain negative Plan: DC to SNF BiPAP nightly and during naps, ensure this is always being done Completed 3 doses of Rocephin for uncomplicated cystitis Delirium precautions   * Acute kidney injury ([place]) Baseline 0.9, admission Cr 1.9.  Creatinine improved, 1.2 Creatinine 1.19 on 11/15, improving on day of dc Suspect multifactorial etiology to acute kidney injury, unable to exclude ATN versus period of ischemia - Kidney function improving over interval Plan: Continue BID lasix on discharge Recheck creat periodically    Microcytic anemia Iron Deficiency Iron Def, Last C scope 2017, should follow  up outpatient. No gross bleeding.  Received 5 doses of IV Venofer 200 mg Recommend p.o. iron at discharge, '325mg'$  PO feosol daily Outpatient follow-up with GI for  screening colonoscopy   Chronic hypoxic respiratory failure, on home oxygen therapy ([place]) Cont Home O2. Supportive care. Was discharged on 4L [place] in Sept 2023. Will be discharged on same rate 11/17     Acute on chronic systolic heart failure History of CHF with EF of 45-50% on carvedilol and Lasix.  Unclear whether weights are accurate however patient is apparently up 20+ pounds since 11/8.  Chest x-ray on 11/15 demonstrates pulmonary venous congestion.  Per patient and husband she is having increasing shortness of breath. Lasix 40 PO BID restarted 11/15.  SOB improving.  Considering symptomatic bradycardia, will hold Coreg on dc.  Continue with propranolol BID on discharge.  Will need follow up with PCP and cardiology.   Pressure ulcer of buttock - Encouraged to frequently rotate position at home when on her recliner   UTI due to enterococcus faecalis.  Symptomatic with dysuria. S/p One dose of fosfomycin 11/11 Possible recurrent UTI Completed 3 days IV rocephin   Chronic venous stasis dermatitis of both lower extremities Lower extremity edema that is nonpitting in nature with hyperpigmentation consistent with chronic venous stasis.  Patient states symptoms are improving.  No evidence of acute infection.  Patient would like to hold off on Un[place] boots at this time. Elevated legs whenever possible.    Inflammatory arthritis Per chart review, patient followed up with her PCP for concern of inflammatory arthritis.  Anti-CCP markedly elevated.  She was referred to rheumatology but has not followed up.  Given patient has had increasing falls, this would be prudent to address.   - Outpatient rheumatology follow-up   OSA (obstructive sleep apnea) - BiPAP at night or whenever she is sleeping   Sinus bradycardia Long-term history of chronic bradycardia per previous EKG review.  She is on both carvedilol (for heart failure) and propranolol (for tremors) that likely explains her bradycardia.  Currently on Propranolol.  Persistent bradycardia and possible symptoms will decrease dose to 20 mg p.o. twice daily.  Hold coreg at time of dc   Type 2 diabetes mellitus with diabetic neuropathy, without long-term current use of insulin (HCC) Peripheral Neuropathy Can resume home regimen at time of discharge   Hx of P A fib On Coreg and Eliquis at home.  Coreg on hold Eliquis resumed   Morbid obesity BMI 54.  This complicates overall care and prognosis.  Discharge Instructions  Discharge Instructions     Diet - low sodium heart healthy   Complete by: As directed    Increase activity slowly   Complete by: As directed    No wound care   Complete by: As directed       Allergies as of 05/29/2022   No Known Allergies      Medication List     STOP taking these medications    carvedilol 6.25 MG tablet Commonly known as: COREG   celecoxib 200 MG capsule Commonly known as: CELEBREX   lidocaine 5 % Commonly known as: LIDODERM       TAKE these medications    acetaminophen 650 MG CR tablet Commonly known as: TYLENOL Take 1,300 mg by mouth every 8 (eight) hours as needed for pain.   ALPRAZolam 0.25 MG tablet Commonly known as: XANAX Take 1 tablet (0.25 mg total) by mouth 3 (three) times daily as needed for anxiety.  apixaban 5 MG Tabs tablet Commonly known as: ELIQUIS Take 1 tablet (5 mg total) by mouth 2 (two) times daily.   atorvastatin 20 MG tablet Commonly known as: LIPITOR Take 20 mg by mouth daily.   Biotin 2.5 MG Caps Take 2.5 mg by mouth daily.   buPROPion 150 MG 24 hr tablet Commonly known as: WELLBUTRIN XL Take 150 mg by mouth daily.   Catheter Self-Adhesive Urinary Misc Continue PurWIck prn if available   Cholecalciferol 25 MCG (1000 UT) tablet Take 1 tablet by mouth daily.   docusate sodium 100 MG capsule Commonly known as: COLACE Take 1 capsule (100 mg total) by mouth 2 (two) times daily as needed for mild constipation.    furosemide 40 MG tablet Commonly known as: Lasix Take 1 tablet (40 mg total) by mouth 2 (two) times daily. Increase to 1 tablet (40 mg total) by mouth THREE TIMES daily (total daily dose 120 mg) as needed for up to 3 days for increased leg swelling, shortness of breath, weight gain 5+ lbs over 1-2 days. Seek medical care if these symptoms are not improving with increased dose. Reduce dose to 1 tablet (40 mg total) by mouth ONCE DAILY if dizziness or low blood pressure on bid dose.   gabapentin 100 MG capsule Commonly known as: NEURONTIN Take 2 capsules (200 mg total) by mouth 3 (three) times daily. What changed:  medication strength how much to take when to take this   levothyroxine 125 MCG tablet Commonly known as: SYNTHROID Take 1 tablet (125 mcg total) by mouth daily. What changed: how much to take   metolazone 2.5 MG tablet Commonly known as: ZAROXOLYN Take 2.5 mg by mouth daily as needed (if weight up > 5 pounds).   mouth rinse Liqd solution 15 mLs by Mouth Rinse route as needed (oral care).   multivitamin tablet Take 1 tablet by mouth daily.   pantoprazole 40 MG tablet Commonly known as: PROTONIX Take 40 mg by mouth daily.   polyethylene glycol 17 g packet Commonly known as: MIRALAX / GLYCOLAX Take 17 g by mouth daily as needed for moderate constipation.   potassium chloride SA 20 MEQ tablet Commonly known as: KLOR-CON M Take 20 mEq by mouth daily.   propranolol 20 MG tablet Commonly known as: INDERAL Take 1 tablet (20 mg total) by mouth 2 (two) times daily. What changed: when to take this   rOPINIRole 3 MG tablet Commonly known as: REQUIP Take 1 tablet (3 mg total) by mouth at bedtime.   Semaglutide (1 MG/DOSE) 2 MG/1.5ML Sopn Inject 0.75 mLs into the skin once a week.   zinc gluconate 50 MG tablet Take 50 mg by mouth daily.        Contact information for after-discharge care     Destination     [facility] Preferred SNF .    Service: Skilled Nursing Contact information: [address] [place] [phone]                    No Known Allergies  Consultations: Pulmonary   Procedures/Studies: MR BRAIN W WO CONTRAST  Result Date: 05/29/2022 CLINICAL DATA:  Altered mental status EXAM: MRI HEAD WITHOUT AND WITH CONTRAST TECHNIQUE: Multiplanar, multiecho pulse sequences of the brain and surrounding structures were obtained without and with intravenous contrast. CONTRAST:  11m GADAVIST GADOBUTROL 1 MMOL/ML IV SOLN COMPARISON:  None Available. FINDINGS: Brain: No acute infarct, mass effect or extra-axial collection. No acute or  chronic hemorrhage. There is multifocal hyperintense T2-weighted signal within the white matter. Parenchymal volume and CSF spaces are normal. The midline structures are normal. Vascular: Major flow voids are preserved. Skull and upper cervical spine: Normal calvarium and skull base. Visualized upper cervical spine and soft tissues are normal. Sinuses/Orbits:No paranasal sinus fluid levels or advanced mucosal thickening. No mastoid or middle ear effusion. Normal orbits. IMPRESSION: 1. No acute intracranial abnormality. 2. Findings of chronic small vessel ischemia. Electronically Signed   By: Ulyses Jarred M.D.   On: 05/29/2022 00:22   DG Chest Port 1 View  Result Date: 05/27/2022 CLINICAL DATA:  Hypoxia EXAM: PORTABLE CHEST 1 VIEW COMPARISON:  03/10/2022 FINDINGS: Cardiomegaly, vascular congestion. No definite overt edema or effusions. No acute bony abnormality. IMPRESSION: Cardiomegaly, vascular congestion Electronically Signed   By: Rolm Baptise M.D.   On: 05/27/2022 09:36   US RENAL  Result Date: 05/20/2022 CLINICAL DATA:  Acute kidney injury EXAM: RENAL / URINARY TRACT ULTRASOUND COMPLETE COMPARISON:  04/11/2020 FINDINGS: Right Kidney: Renal measurements: 10.6 x 5.7 x 5.3 cm = volume: 169 mL. Echogenicity within normal limits. 14 x 10 x 10 mm upper pole  simple cyst, unchanged, benign. No hydronephrosis. Left Kidney: Renal measurements: 10.1 x 5.4 x 4.8 cm = volume: 135 mL. Echogenicity within normal limits. No mass or hydronephrosis visualized. Bladder: Appears normal for degree of bladder distention. Other: None. IMPRESSION: 14 mm right upper pole simple cyst, unchanged, benign. No follow-up is recommended. No hydronephrosis. Electronically Signed   By: Julian Hy M.D.   On: 05/20/2022 21:41      Subjective: Seen and examined on day of discharge.  Mental status improved.  Appropriate for dc to SNF.  Discharge Exam: Vitals:   05/28/22 2250 05/29/22 0418  BP: (!) 101/46 114/65  Pulse: (!) 56 (!) 59  Resp:  18  Temp:  98.7 F (37.1 C)  SpO2:  91%   Vitals:   05/28/22 1955 05/28/22 2225 05/28/22 2250 05/29/22 0418  BP: 139/68  (!) 101/46 114/65  Pulse: (!) 59  (!) 56 (!) 59  Resp: 18   18  Temp: 98 F (36.7 C)   98.7 F (37.1 C)  TempSrc: Oral     SpO2: 100% 97%  91%  Weight:      Height:        General: Pt is alert, awake, not in acute distress Cardiovascular: RRR, S1/S2 +, no rubs, no gallops Respiratory: CTA bilaterally, no wheezing, no rhonchi Abdominal: Soft, NT, ND, bowel sounds + Extremities: no edema, no cyanosis    The results of significant diagnostics from this hospitalization (including imaging, microbiology, ancillary and laboratory) are listed below for reference.     Microbiology: Recent Results (from the past 240 hour(s))  Urine Culture     Status: Abnormal   Collection Time: 05/20/22  8:06 PM   Specimen: Urine, Random  Result Value Ref Range Status   Specimen Description   Final    URINE, RANDOM Performed at [facility], [address], [place], [place] [address]    Special Requests   Final    NONE Performed at [facility], [place], [place] [address]    Culture 30,000 COLONIES/mL ENTEROCOCCUS FAECALIS (A)  Final   Report Status 05/23/2022 FINAL  Final    Organism ID, Bacteria ENTEROCOCCUS FAECALIS (A)  Final      Susceptibility   Enterococcus faecalis - MIC*    AMPICILLIN <=2 SENSITIVE Sensitive  NITROFURANTOIN <=16 SENSITIVE Sensitive     VANCOMYCIN 1 SENSITIVE Sensitive     * 30,000 COLONIES/mL ENTEROCOCCUS FAECALIS  Urine Culture     Status: Abnormal (Preliminary result)   Collection Time: 05/27/22  1:00 PM   Specimen: Urine, Clean Catch  Result Value Ref Range Status   Specimen Description   Final    URINE, CLEAN CATCH Performed at [facility], [address], [place], [place] [address]    Special Requests   Final    NONE Performed at [facility], [address] [place], [place] [address]    Culture >=100,000 COLONIES/mL GRAM NEGATIVE RODS (A)  Final   Report Status PENDING  Incomplete     Labs: BNP (last 3 results) Recent Labs    03/05/22 1743 03/10/22 1442 03/14/22 1139  BNP 955.0* 838.6* 818.5*   Basic Metabolic Panel: Recent Labs  Lab 05/25/22 0437 05/26/22 0619 05/27/22 0526 05/28/22 0444 05/29/22 0528  NA 142 142 142 143 145  K 4.7 4.7 5.0 4.6 4.2  CL 106 105 105 105 105  CO2 '30 29 30 29 '$ 33*  GLUCOSE 79 74 92 68* 75  BUN 33* 33* 32* 33* 29*  CREATININE 1.36* 1.28* 1.19* 1.17* 1.14*  CALCIUM 8.6* 8.4* 8.7* 8.6* 8.3*  MG 2.5* 2.5* 2.6* 2.4 2.1   Liver Function Tests: No results for input(s): "AST", "ALT", "ALKPHOS", "BILITOT", "PROT", "ALBUMIN" in the last 168 hours. No results for input(s): "LIPASE", "AMYLASE" in the last 168 hours. No results for input(s): "AMMONIA" in the last 168 hours. CBC: Recent Labs  Lab 05/25/22 0437 05/26/22 0619 05/27/22 0526 05/28/22 0444 05/29/22 0528  WBC 12.1* 13.1* 13.4* 11.4* 11.0*  HGB 9.7* 9.3* 9.3* 9.0* 9.2*  HCT 36.8 35.2* 35.2* 33.8* 34.5*  MCV 79.7* 81.1 81.5 81.8 81.2  PLT 441* 439* 459* 429* 405*   Cardiac Enzymes: No results for input(s): "CKTOTAL", "CKMB", "CKMBINDEX", "TROPONINI" in the last 168 hours. BNP: Invalid  input(s): "POCBNP" CBG: Recent Labs  Lab 05/24/22 1230 05/24/22 1633 05/24/22 2112 05/25/22 0819 05/25/22 1147  GLUCAP 97 84 102* 72 100*   D-Dimer No results for input(s): "DDIMER" in the last 72 hours. Hgb A1c No results for input(s): "HGBA1C" in the last 72 hours. Lipid Profile No results for input(s): "CHOL", "HDL", "LDLCALC", "TRIG", "CHOLHDL", "LDLDIRECT" in the last 72 hours. Thyroid function studies No results for input(s): "TSH", "T4TOTAL", "T3FREE", "THYROIDAB" in the last 72 hours.  Invalid input(s): "FREET3" Anemia work up No results for input(s): "VITAMINB12", "FOLATE", "FERRITIN", "TIBC", "IRON", "RETICCTPCT" in the last 72 hours. Urinalysis    Component Value Date/Time   COLORURINE AMBER (A) 05/27/2022 1300   APPEARANCEUR CLOUDY (A) 05/27/2022 1300   APPEARANCEUR CLOUDY 01/15/2014 0209   LABSPEC 1.018 05/27/2022 1300   LABSPEC 1.014 01/15/2014 0209   PHURINE 5.0 05/27/2022 1300   GLUCOSEU NEGATIVE 05/27/2022 1300   GLUCOSEU NEGATIVE 01/15/2014 0209   HGBUR NEGATIVE 05/27/2022 1300   BILIRUBINUR NEGATIVE 05/27/2022 1300   BILIRUBINUR NEGATIVE 01/15/2014 0209   KETONESUR NEGATIVE 05/27/2022 1300   PROTEINUR 30 (A) 05/27/2022 1300   NITRITE NEGATIVE 05/27/2022 1300   LEUKOCYTESUR TRACE (A) 05/27/2022 1300   LEUKOCYTESUR 2+ 01/15/2014 0209   Sepsis Labs Recent Labs  Lab 05/26/22 0619 05/27/22 0526 05/28/22 0444 05/29/22 0528  WBC 13.1* 13.4* 11.4* 11.0*   Microbiology Recent Results (from the past 240 hour(s))  Urine Culture     Status: Abnormal   Collection Time: 05/20/22  8:06 PM  Specimen: Urine, Random  Result Value Ref Range Status   Specimen Description   Final    URINE, RANDOM Performed at [facility], [address] [place], [place] [address]    Special Requests   Final    NONE Performed at [facility], [address] [place], [place] [address]    Culture 30,000 COLONIES/mL ENTEROCOCCUS FAECALIS (A)  Final    Report Status 05/23/2022 FINAL  Final   Organism ID, Bacteria ENTEROCOCCUS FAECALIS (A)  Final      Susceptibility   Enterococcus faecalis - MIC*    AMPICILLIN <=2 SENSITIVE Sensitive     NITROFURANTOIN <=16 SENSITIVE Sensitive     VANCOMYCIN 1 SENSITIVE Sensitive     * 30,000 COLONIES/mL ENTEROCOCCUS FAECALIS  Urine Culture     Status: Abnormal (Preliminary result)   Collection Time: 05/27/22  1:00 PM   Specimen: Urine, Clean Catch  Result Value Ref Range Status   Specimen Description   Final    URINE, CLEAN CATCH Performed at [facility], [address] [place], [place] [address]    Special Requests   Final    NONE Performed at [facility], [address] [place], [place] [address]    Culture >=100,000 COLONIES/mL GRAM NEGATIVE RODS (A)  Final   Report Status PENDING  Incomplete     Time coordinating discharge: Over 30 minutes  SIGNED:   Sidney Ace, MD  [facility] 05/29/2022, 10:45 AM Pager   If 7PM-7AM, please contact night-coverage

## 2022-05-29 NOTE — Progress Notes (Signed)
Occupational Therapy Treatment Patient Details Name: Madison Mcbride MRN: [id number] DOB: May 09, 1956 Today's Date: 05/29/2022   History of present illness Pt is a 66 y.o. female presenting to hospital 05/20/22.  Per MD note "She is mostly sedentary at home, in recent days her bottom was having sores and patient is having burning and itching.  She also endorses fall 2 days PTA where her knees buckled-did not hit her head or had any injuries.  Complaining of mild dysuria recently."   Pt admitted with AKI, microcytic anemia, CHF, chronic hypoxic respiratory failure (on home O2), pressure ulcer of buttock, dysuria, chronic venous stasis dermatitis of B LE's, inflammatory arthritis, OSA, and sinus bradycardia.  PMH includes CHF, DM, lymphedema, RLS, sleep apnea, thyroid CA.   OT comments  Pt son approached therapist re: his mom asking to sit on edge of bed. Chart reviewed, pt requests to attempt seated on edge of bed. Pt is oriented to self, place, grossly to situation however increased time and multi modal cueing required for processing. MAX A +2 with heavy use of hospital bed features, management of BLE required for supine<>sit. Poor sitting balance on edge of bed with pt with consistent left lateal lean on fully raised head of bed. MIN A for grooming tasks seated on edge of bed. TOTAL A +2 required for boost in bed. Pt is scheduled to go to rehab on thsi date, discharge recommendation remains appropriate.    Recommendations for follow up therapy are one component of a multi-disciplinary discharge planning process, led by the attending physician.  Recommendations may be updated based on patient status, additional functional criteria and insurance authorization.    Follow Up Recommendations  Skilled nursing-short term rehab (<3 hours/day)     Assistance Recommended at Discharge Intermittent Supervision/Assistance  Patient can return home with the following  A lot of help with walking and/or  transfers;A lot of help with bathing/dressing/bathroom;Assistance with cooking/housework;Help with stairs or ramp for entrance;Assist for transportation   Equipment Recommendations  Other (comment) (defer)    Recommendations for Other Services      Precautions / Restrictions Precautions Precautions: Fall Restrictions Weight Bearing Restrictions: No       Mobility Bed Mobility Overal bed mobility: Needs Assistance Bed Mobility: Supine to Sit, Sit to Supine     Supine to sit: Max assist, HOB elevated Sit to supine: Max assist, HOB elevated   General bed mobility comments: pt attempts to assist with use of BUE, TOTAL A +2 for boost in bed    Transfers                         Balance Overall balance assessment: Needs assistance Sitting-balance support: No upper extremity supported, Feet supported Sitting balance-Leahy Scale: Poor Sitting balance - Comments: support on HOB fully raised                                   ADL either performed or assessed with clinical judgement   ADL Overall ADL's : Needs assistance/impaired                                       General ADL Comments: MIN A for grooming tasks seated on edge of bed    Extremity/Trunk Assessment  Vision       Perception     Praxis      Cognition Arousal/Alertness: Awake/alert Behavior During Therapy: Flat affect Overall Cognitive Status: Impaired/Different from baseline                               Problem Solving: Slow processing, Requires verbal cues, Requires tactile cues, Difficulty sequencing, Decreased initiation          Exercises      Shoulder Instructions       General Comments      Pertinent Vitals/ Pain       Pain Assessment Pain Assessment: No/denies pain  Home Living                                          Prior Functioning/Environment              Frequency  Min 2X/week         Progress Toward Goals  OT Goals(current goals can now be found in the care plan section)  Progress towards OT goals: Progressing toward goals     Plan Discharge plan remains appropriate    Co-evaluation                 AM-PAC OT "6 Clicks" Daily Activity     Outcome Measure   Help from another person eating meals?: None Help from another person taking care of personal grooming?: A Little Help from another person toileting, which includes using toliet, bedpan, or urinal?: Total Help from another person bathing (including washing, rinsing, drying)?: A Lot Help from another person to put on and taking off regular upper body clothing?: A Little Help from another person to put on and taking off regular lower body clothing?: Total 6 Click Score: 14    End of Session Equipment Utilized During Treatment: Oxygen  OT Visit Diagnosis: Unsteadiness on feet (R26.81);Repeated falls (R29.6);Muscle weakness (generalized) (M62.81)   Activity Tolerance Patient tolerated treatment well   Patient Left in bed;with call bell/phone within reach;with bed alarm set   Nurse Communication          Time: 0865-7846 OT Time Calculation (min): 15 min  Charges: OT General Charges $OT Visit: 1 Visit OT Treatments $Therapeutic Activity: 8-22 mins  Shanon Payor, OTD OTR/L  05/29/22, 2:25 PM

## 2022-05-29 NOTE — Plan of Care (Signed)
PT d/c to  snf through [facility]. BP 114/65 (BP Location: Left Arm)   Pulse (!) 59   Temp 98.7 F (37.1 C)   Resp 18   Ht '5\' 6"'$  (1.676 m)   Wt (!) 154.4 kg   SpO2 90%   BMI 54.94 kg/m  IV D/C and personal belongings transported by [facility]u. Louanne Skye 05/29/22 3:26 PM

## 2022-05-30 LAB — URINE CULTURE: Culture: >=100000 — AB

## 2022-07-08 ENCOUNTER — Emergency Department: Payer: Medicare Other

## 2022-07-08 ENCOUNTER — Other Ambulatory Visit: Payer: Self-pay

## 2022-07-08 DIAGNOSIS — Z6841 Body Mass Index (BMI) 40.0 and over, adult: Secondary | ICD-10-CM

## 2022-07-08 DIAGNOSIS — I480 Paroxysmal atrial fibrillation: Secondary | ICD-10-CM | POA: Diagnosis not present

## 2022-07-08 DIAGNOSIS — L819 Disorder of pigmentation, unspecified: Secondary | ICD-10-CM | POA: Diagnosis present

## 2022-07-08 DIAGNOSIS — G8929 Other chronic pain: Secondary | ICD-10-CM | POA: Diagnosis present

## 2022-07-08 DIAGNOSIS — Z7984 Long term (current) use of oral hypoglycemic drugs: Secondary | ICD-10-CM

## 2022-07-08 DIAGNOSIS — A419 Sepsis, unspecified organism: Principal | ICD-10-CM | POA: Diagnosis present

## 2022-07-08 DIAGNOSIS — I5022 Chronic systolic (congestive) heart failure: Secondary | ICD-10-CM | POA: Diagnosis present

## 2022-07-08 DIAGNOSIS — I712 Thoracic aortic aneurysm, without rupture, unspecified: Secondary | ICD-10-CM | POA: Diagnosis present

## 2022-07-08 DIAGNOSIS — G9341 Metabolic encephalopathy: Secondary | ICD-10-CM | POA: Diagnosis not present

## 2022-07-08 DIAGNOSIS — Z87440 Personal history of urinary (tract) infections: Secondary | ICD-10-CM

## 2022-07-08 DIAGNOSIS — Z5320 Procedure and treatment not carried out because of patient's decision for unspecified reasons: Secondary | ICD-10-CM | POA: Diagnosis not present

## 2022-07-08 DIAGNOSIS — E875 Hyperkalemia: Secondary | ICD-10-CM | POA: Diagnosis present

## 2022-07-08 DIAGNOSIS — I700 Atherosclerosis of aorta: Secondary | ICD-10-CM | POA: Diagnosis present

## 2022-07-08 DIAGNOSIS — I2510 Atherosclerotic heart disease of native coronary artery without angina pectoris: Secondary | ICD-10-CM | POA: Diagnosis present

## 2022-07-08 DIAGNOSIS — I272 Pulmonary hypertension, unspecified: Secondary | ICD-10-CM | POA: Diagnosis present

## 2022-07-08 DIAGNOSIS — F419 Anxiety disorder, unspecified: Secondary | ICD-10-CM | POA: Diagnosis present

## 2022-07-08 DIAGNOSIS — Z1152 Encounter for screening for COVID-19: Secondary | ICD-10-CM

## 2022-07-08 DIAGNOSIS — D631 Anemia in chronic kidney disease: Secondary | ICD-10-CM | POA: Diagnosis present

## 2022-07-08 DIAGNOSIS — J918 Pleural effusion in other conditions classified elsewhere: Secondary | ICD-10-CM | POA: Diagnosis present

## 2022-07-08 DIAGNOSIS — Y92239 Unspecified place in hospital as the place of occurrence of the external cause: Secondary | ICD-10-CM | POA: Diagnosis not present

## 2022-07-08 DIAGNOSIS — Z79899 Other long term (current) drug therapy: Secondary | ICD-10-CM

## 2022-07-08 DIAGNOSIS — Z66 Do not resuscitate: Secondary | ICD-10-CM | POA: Diagnosis present

## 2022-07-08 DIAGNOSIS — E11649 Type 2 diabetes mellitus with hypoglycemia without coma: Secondary | ICD-10-CM | POA: Diagnosis not present

## 2022-07-08 DIAGNOSIS — R001 Bradycardia, unspecified: Secondary | ICD-10-CM | POA: Diagnosis present

## 2022-07-08 DIAGNOSIS — I5023 Acute on chronic systolic (congestive) heart failure: Secondary | ICD-10-CM | POA: Diagnosis not present

## 2022-07-08 DIAGNOSIS — N3090 Cystitis, unspecified without hematuria: Secondary | ICD-10-CM | POA: Diagnosis present

## 2022-07-08 DIAGNOSIS — R570 Cardiogenic shock: Secondary | ICD-10-CM | POA: Diagnosis not present

## 2022-07-08 DIAGNOSIS — E876 Hypokalemia: Secondary | ICD-10-CM | POA: Diagnosis present

## 2022-07-08 DIAGNOSIS — Z8619 Personal history of other infectious and parasitic diseases: Secondary | ICD-10-CM

## 2022-07-08 DIAGNOSIS — G2581 Restless legs syndrome: Secondary | ICD-10-CM | POA: Diagnosis present

## 2022-07-08 DIAGNOSIS — I4892 Unspecified atrial flutter: Secondary | ICD-10-CM | POA: Diagnosis not present

## 2022-07-08 DIAGNOSIS — T447X6A Underdosing of beta-adrenoreceptor antagonists, initial encounter: Secondary | ICD-10-CM | POA: Diagnosis not present

## 2022-07-08 DIAGNOSIS — Y95 Nosocomial condition: Secondary | ICD-10-CM | POA: Diagnosis not present

## 2022-07-08 DIAGNOSIS — R6521 Severe sepsis with septic shock: Secondary | ICD-10-CM | POA: Diagnosis not present

## 2022-07-08 DIAGNOSIS — R1030 Lower abdominal pain, unspecified: Secondary | ICD-10-CM | POA: Diagnosis not present

## 2022-07-08 DIAGNOSIS — R002 Palpitations: Secondary | ICD-10-CM | POA: Diagnosis not present

## 2022-07-08 DIAGNOSIS — T428X5A Adverse effect of antiparkinsonism drugs and other central muscle-tone depressants, initial encounter: Secondary | ICD-10-CM | POA: Diagnosis not present

## 2022-07-08 DIAGNOSIS — E1140 Type 2 diabetes mellitus with diabetic neuropathy, unspecified: Secondary | ICD-10-CM | POA: Diagnosis present

## 2022-07-08 DIAGNOSIS — E869 Volume depletion, unspecified: Secondary | ICD-10-CM | POA: Diagnosis not present

## 2022-07-08 DIAGNOSIS — R000 Tachycardia, unspecified: Secondary | ICD-10-CM | POA: Diagnosis not present

## 2022-07-08 DIAGNOSIS — E785 Hyperlipidemia, unspecified: Secondary | ICD-10-CM | POA: Diagnosis present

## 2022-07-08 DIAGNOSIS — J189 Pneumonia, unspecified organism: Secondary | ICD-10-CM | POA: Diagnosis not present

## 2022-07-08 DIAGNOSIS — N179 Acute kidney failure, unspecified: Secondary | ICD-10-CM | POA: Diagnosis present

## 2022-07-08 DIAGNOSIS — M549 Dorsalgia, unspecified: Secondary | ICD-10-CM | POA: Diagnosis present

## 2022-07-08 DIAGNOSIS — J9621 Acute and chronic respiratory failure with hypoxia: Secondary | ICD-10-CM | POA: Diagnosis not present

## 2022-07-08 DIAGNOSIS — I2489 Other forms of acute ischemic heart disease: Secondary | ICD-10-CM | POA: Diagnosis present

## 2022-07-08 DIAGNOSIS — G253 Myoclonus: Secondary | ICD-10-CM | POA: Diagnosis not present

## 2022-07-08 DIAGNOSIS — K219 Gastro-esophageal reflux disease without esophagitis: Secondary | ICD-10-CM | POA: Diagnosis present

## 2022-07-08 DIAGNOSIS — F32A Depression, unspecified: Secondary | ICD-10-CM | POA: Diagnosis present

## 2022-07-08 DIAGNOSIS — F410 Panic disorder [episodic paroxysmal anxiety] without agoraphobia: Secondary | ICD-10-CM | POA: Diagnosis present

## 2022-07-08 DIAGNOSIS — Z91138 Patient's unintentional underdosing of medication regimen for other reason: Secondary | ICD-10-CM

## 2022-07-08 DIAGNOSIS — Z90710 Acquired absence of both cervix and uterus: Secondary | ICD-10-CM

## 2022-07-08 DIAGNOSIS — J440 Chronic obstructive pulmonary disease with acute lower respiratory infection: Secondary | ICD-10-CM | POA: Diagnosis not present

## 2022-07-08 DIAGNOSIS — R531 Weakness: Secondary | ICD-10-CM | POA: Diagnosis present

## 2022-07-08 DIAGNOSIS — E890 Postprocedural hypothyroidism: Secondary | ICD-10-CM | POA: Diagnosis present

## 2022-07-08 DIAGNOSIS — R571 Hypovolemic shock: Secondary | ICD-10-CM | POA: Diagnosis not present

## 2022-07-08 DIAGNOSIS — Z85850 Personal history of malignant neoplasm of thyroid: Secondary | ICD-10-CM

## 2022-07-08 DIAGNOSIS — I429 Cardiomyopathy, unspecified: Secondary | ICD-10-CM | POA: Diagnosis present

## 2022-07-08 DIAGNOSIS — I872 Venous insufficiency (chronic) (peripheral): Secondary | ICD-10-CM | POA: Diagnosis present

## 2022-07-08 DIAGNOSIS — E662 Morbid (severe) obesity with alveolar hypoventilation: Secondary | ICD-10-CM | POA: Diagnosis present

## 2022-07-08 DIAGNOSIS — Z515 Encounter for palliative care: Secondary | ICD-10-CM

## 2022-07-08 DIAGNOSIS — R079 Chest pain, unspecified: Secondary | ICD-10-CM | POA: Diagnosis not present

## 2022-07-08 DIAGNOSIS — I469 Cardiac arrest, cause unspecified: Secondary | ICD-10-CM | POA: Diagnosis not present

## 2022-07-08 DIAGNOSIS — N1831 Chronic kidney disease, stage 3a: Secondary | ICD-10-CM | POA: Diagnosis present

## 2022-07-08 DIAGNOSIS — J9622 Acute and chronic respiratory failure with hypercapnia: Secondary | ICD-10-CM | POA: Diagnosis not present

## 2022-07-08 DIAGNOSIS — T508X5A Adverse effect of diagnostic agents, initial encounter: Secondary | ICD-10-CM | POA: Diagnosis not present

## 2022-07-08 DIAGNOSIS — E1122 Type 2 diabetes mellitus with diabetic chronic kidney disease: Secondary | ICD-10-CM | POA: Diagnosis present

## 2022-07-08 DIAGNOSIS — I878 Other specified disorders of veins: Secondary | ICD-10-CM | POA: Diagnosis present

## 2022-07-08 DIAGNOSIS — I130 Hypertensive heart and chronic kidney disease with heart failure and stage 1 through stage 4 chronic kidney disease, or unspecified chronic kidney disease: Secondary | ICD-10-CM | POA: Diagnosis present

## 2022-07-08 DIAGNOSIS — D509 Iron deficiency anemia, unspecified: Secondary | ICD-10-CM | POA: Diagnosis present

## 2022-07-08 DIAGNOSIS — Z7901 Long term (current) use of anticoagulants: Secondary | ICD-10-CM

## 2022-07-08 DIAGNOSIS — F05 Delirium due to known physiological condition: Secondary | ICD-10-CM | POA: Diagnosis not present

## 2022-07-08 LAB — COMPREHENSIVE METABOLIC PANEL
ALT: 14 U/L (ref 0–44)
AST: 28 U/L (ref 15–41)
Albumin: 3 g/dL — ABNORMAL LOW (ref 3.5–5.0)
Alkaline Phosphatase: 110 U/L (ref 38–126)
Anion gap: 11 (ref 5–15)
BUN: 47 mg/dL — ABNORMAL HIGH (ref 8–23)
CO2: 33 mmol/L — ABNORMAL HIGH (ref 22–32)
Calcium: 8.6 mg/dL — ABNORMAL LOW (ref 8.9–10.3)
Chloride: 97 mmol/L — ABNORMAL LOW (ref 98–111)
Creatinine, Ser: 1.39 mg/dL — ABNORMAL HIGH (ref 0.44–1.00)
GFR, Estimated: 41 mL/min — ABNORMAL LOW (ref >60)
Glucose, Bld: 96 mg/dL (ref 70–99)
Potassium: 4.2 mmol/L (ref 3.5–5.1)
Sodium: 141 mmol/L (ref 135–145)
Total Bilirubin: 1.6 mg/dL — ABNORMAL HIGH (ref 0.3–1.2)
Total Protein: 7.4 g/dL (ref 6.5–8.1)

## 2022-07-08 LAB — URINALYSIS, COMPLETE (UACMP) WITH MICROSCOPIC
Bilirubin Urine: NEGATIVE
Glucose, UA: NEGATIVE mg/dL
Ketones, ur: NEGATIVE mg/dL
Nitrite: NEGATIVE
Protein, ur: NEGATIVE mg/dL
Specific Gravity, Urine: 1.015 (ref 1.005–1.030)
Squamous Epithelial / HPF: >50 /HPF — ABNORMAL HIGH (ref 0–5)
WBC, UA: >50 WBC/hpf — ABNORMAL HIGH (ref 0–5)
pH: 5 (ref 5.0–8.0)

## 2022-07-08 LAB — CBC WITH DIFFERENTIAL/PLATELET
Abs Immature Granulocytes: 0.04 10*3/uL (ref 0.00–0.07)
Basophils Absolute: 0.1 10*3/uL (ref 0.0–0.1)
Basophils Relative: 1 %
Eosinophils Absolute: 0.3 10*3/uL (ref 0.0–0.5)
Eosinophils Relative: 2 %
HCT: 39.4 % (ref 36.0–46.0)
Hemoglobin: 11.1 g/dL — ABNORMAL LOW (ref 12.0–15.0)
Immature Granulocytes: 0 %
Lymphocytes Relative: 12 %
Lymphs Abs: 1.6 10*3/uL (ref 0.7–4.0)
MCH: 24.9 pg — ABNORMAL LOW (ref 26.0–34.0)
MCHC: 28.2 g/dL — ABNORMAL LOW (ref 30.0–36.0)
MCV: 88.3 fL (ref 80.0–100.0)
Monocytes Absolute: 0.7 10*3/uL (ref 0.1–1.0)
Monocytes Relative: 6 %
Neutro Abs: 10.3 10*3/uL — ABNORMAL HIGH (ref 1.7–7.7)
Neutrophils Relative %: 79 %
Platelets: 313 10*3/uL (ref 150–400)
RBC: 4.46 MIL/uL (ref 3.87–5.11)
RDW: 23.4 % — ABNORMAL HIGH (ref 11.5–15.5)
Smear Review: NORMAL
WBC: 12.6 10*3/uL — ABNORMAL HIGH (ref 4.0–10.5)
nRBC: 0.2 % (ref 0.0–0.2)

## 2022-07-08 LAB — RESP PANEL BY RT-PCR (RSV, FLU A&B, COVID)  RVPGX2
Influenza A by PCR: NEGATIVE
Influenza B by PCR: NEGATIVE
Resp Syncytial Virus by PCR: NEGATIVE
SARS Coronavirus 2 by RT PCR: NEGATIVE

## 2022-07-08 LAB — PROTIME-INR
INR: 2.3 — ABNORMAL HIGH (ref 0.8–1.2)
Prothrombin Time: 24.7 seconds — ABNORMAL HIGH (ref 11.4–15.2)

## 2022-07-08 LAB — APTT: aPTT: 44 seconds — ABNORMAL HIGH (ref 24–36)

## 2022-07-08 LAB — LACTIC ACID, PLASMA: Lactic Acid, Venous: 1.7 mmol/L (ref 0.5–1.9)

## 2022-07-08 LAB — TROPONIN I (HIGH SENSITIVITY)
Troponin I (High Sensitivity): 38 ng/L — ABNORMAL HIGH (ref <18)
Troponin I (High Sensitivity): 36 ng/L — ABNORMAL HIGH (ref <18)

## 2022-07-08 MED ORDER — LACTATED RINGERS IV SOLN: INTRAVENOUS | Status: DC

## 2022-07-08 MED ORDER — LACTATED RINGERS IV BOLUS (SEPSIS)
1000.0000 mL | Freq: Once | INTRAVENOUS | Status: AC
  Administered 2022-07-08: 1000 mL via INTRAVENOUS

## 2022-07-08 MED ORDER — LACTATED RINGERS IV BOLUS (SEPSIS)
800.0000 mL | Freq: Once | INTRAVENOUS | Status: AC
  Administered 2022-07-08: 800 mL via INTRAVENOUS

## 2022-07-08 MED ORDER — SODIUM CHLORIDE 0.9 % IV SOLN
2.0000 g | INTRAVENOUS | Status: DC
  Administered 2022-07-08: 2 g via INTRAVENOUS
  Filled 2022-07-08: qty 20

## 2022-07-08 NOTE — Consult Note (Incomplete)
CODE SEPSIS - PHARMACY COMMUNICATION  **Broad Spectrum Antibiotics should be administered within 1 hour of Sepsis diagnosis**  Time Code Sepsis Called/Page Received: 4765  Antibiotics Ordered: Ceftriaxone  Time of 1st antibiotic administration: ***  Additional action taken by pharmacy: Called RN in ED waiting room, she is currently unable to give Ceftriaxone due to volume of patients she is covering. She was made aware of antibiotic orders and will give as soon as able.  If necessary, Name of Provider/Nurse Contacted: Mosetta Pigeon, RN  Dorothe Pea, PharmD, BCPS Clinical Pharmacist   07/08/2022 8:28 PM

## 2022-07-08 NOTE — Progress Notes (Signed)
CODE SEPSIS - PHARMACY COMMUNICATION  **Broad Spectrum Antibiotics should be administered within 1 hour of Sepsis diagnosis**  Time Code Sepsis Called/Page Received: 9539  Antibiotics Ordered: Ceftriaxone  Time of 1st antibiotic administration: 2215, pt was still in waiting room when code sepsis initiated.  Renda Rolls, PharmD, [facility] 07/08/2022 11:46 PM

## 2022-07-08 NOTE — ED Triage Notes (Signed)
First Nurse Note:  Arrives via [facility]:  from [facility].  Patient is without complaint. Staff state they have checker her pulse for the last 10-12 hours and pulse 120's.  P:  126.  Otherwise vs wnl.  Hx of hypercapnia.  20 g left wrist.

## 2022-07-08 NOTE — ED Provider Triage Note (Signed)
Emergency Medicine Provider Triage Evaluation Note  Madison Mcbride, a 66 y.o. female  was evaluated in triage.  Pt complains of  Tachycardia and malaise.  Presents to the ED via EMS from [facility].  She noted increased heart rate today.  Patient denies any frank chest pain or shortness of breath.  She does have history of recent treatment for recurrent UTIs.  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  She denies any recent injury, trauma, fall.  Patient is taking Tylenol arthritis strength, for her knee pain, which is likely masking significant fevers.  Review of Systems  Positive: Tachycardia, hypotension, tachypnea, low grade fevers Negative: NVD  Physical Exam  BP (!) 142/111   Pulse (!) 127   Temp 99.1 F (37.3 C)   Resp (!) 22   Ht '5\' 4"'$  (1.626 m)   Wt 134.7 kg   SpO2 95%   BMI 50.98 kg/m  Gen:   Awake, no distress  CTA Resp:  Normal effort Tachypneic MSK:   Moves extremities without difficulty  CVS:  Tachy rate  Medical Decision Making  Medically screening exam initiated at 7:35 PM.  Appropriate orders placed.  Damita Lack Woodrum was informed that the remainder of the evaluation will be completed by another provider, this initial triage assessment does not replace that evaluation, and the importance of remaining in the ED until their evaluation is complete.  Patient to the ED for evaluation of low-grade fevers and tachycardia, presenting to the ED from her facility via EMS.  Patient with history of recurrent UTIs presents for evaluation.  Patient presentation is septic and sepsis protocol orders are initiated.   Melvenia Needles, PA-C 07/08/22 1952

## 2022-07-08 NOTE — Sepsis Progress Note (Signed)
Following per sepsis protocol   

## 2022-07-08 NOTE — ED Triage Notes (Addendum)
Pt arrives via [facility] from [facility] c/o fast heart rate that started today. HR has been arouns 120s-130s all day. Pt denies CP and SOB.  Hx of afib and CHF. Pt in NAD at this time.  Pt wears 4L O2 [place] at baseline

## 2022-07-09 ENCOUNTER — Inpatient Hospital Stay
Admission: EM | Admit: 2022-07-09 | Discharge: 2022-08-13 | DRG: 871 | Disposition: E | Payer: Medicare Other | Source: Skilled Nursing Facility | Attending: Internal Medicine | Admitting: Internal Medicine

## 2022-07-09 ENCOUNTER — Encounter: Payer: Self-pay | Admitting: Internal Medicine

## 2022-07-09 DIAGNOSIS — J9601 Acute respiratory failure with hypoxia: Secondary | ICD-10-CM | POA: Diagnosis not present

## 2022-07-09 DIAGNOSIS — E875 Hyperkalemia: Secondary | ICD-10-CM | POA: Diagnosis not present

## 2022-07-09 DIAGNOSIS — I959 Hypotension, unspecified: Secondary | ICD-10-CM | POA: Diagnosis not present

## 2022-07-09 DIAGNOSIS — I700 Atherosclerosis of aorta: Secondary | ICD-10-CM | POA: Diagnosis present

## 2022-07-09 DIAGNOSIS — F418 Other specified anxiety disorders: Secondary | ICD-10-CM | POA: Diagnosis present

## 2022-07-09 DIAGNOSIS — D509 Iron deficiency anemia, unspecified: Secondary | ICD-10-CM | POA: Diagnosis present

## 2022-07-09 DIAGNOSIS — N1831 Chronic kidney disease, stage 3a: Secondary | ICD-10-CM | POA: Diagnosis present

## 2022-07-09 DIAGNOSIS — I5023 Acute on chronic systolic (congestive) heart failure: Secondary | ICD-10-CM | POA: Diagnosis not present

## 2022-07-09 DIAGNOSIS — N390 Urinary tract infection, site not specified: Secondary | ICD-10-CM | POA: Diagnosis not present

## 2022-07-09 DIAGNOSIS — J9621 Acute and chronic respiratory failure with hypoxia: Secondary | ICD-10-CM | POA: Diagnosis not present

## 2022-07-09 DIAGNOSIS — Z7189 Other specified counseling: Secondary | ICD-10-CM | POA: Diagnosis not present

## 2022-07-09 DIAGNOSIS — A419 Sepsis, unspecified organism: Secondary | ICD-10-CM

## 2022-07-09 DIAGNOSIS — I5031 Acute diastolic (congestive) heart failure: Secondary | ICD-10-CM | POA: Diagnosis not present

## 2022-07-09 DIAGNOSIS — G2581 Restless legs syndrome: Secondary | ICD-10-CM | POA: Diagnosis present

## 2022-07-09 DIAGNOSIS — I482 Chronic atrial fibrillation, unspecified: Secondary | ICD-10-CM

## 2022-07-09 DIAGNOSIS — R0602 Shortness of breath: Secondary | ICD-10-CM | POA: Diagnosis not present

## 2022-07-09 DIAGNOSIS — Z515 Encounter for palliative care: Secondary | ICD-10-CM | POA: Diagnosis not present

## 2022-07-09 DIAGNOSIS — R6521 Severe sepsis with septic shock: Secondary | ICD-10-CM | POA: Diagnosis not present

## 2022-07-09 DIAGNOSIS — I5022 Chronic systolic (congestive) heart failure: Secondary | ICD-10-CM | POA: Diagnosis not present

## 2022-07-09 DIAGNOSIS — E039 Hypothyroidism, unspecified: Secondary | ICD-10-CM

## 2022-07-09 DIAGNOSIS — N179 Acute kidney failure, unspecified: Secondary | ICD-10-CM | POA: Diagnosis not present

## 2022-07-09 DIAGNOSIS — E11649 Type 2 diabetes mellitus with hypoglycemia without coma: Secondary | ICD-10-CM | POA: Diagnosis not present

## 2022-07-09 DIAGNOSIS — I480 Paroxysmal atrial fibrillation: Secondary | ICD-10-CM | POA: Diagnosis not present

## 2022-07-09 DIAGNOSIS — I272 Pulmonary hypertension, unspecified: Secondary | ICD-10-CM | POA: Diagnosis present

## 2022-07-09 DIAGNOSIS — I10 Essential (primary) hypertension: Secondary | ICD-10-CM | POA: Diagnosis not present

## 2022-07-09 DIAGNOSIS — I4891 Unspecified atrial fibrillation: Secondary | ICD-10-CM | POA: Diagnosis not present

## 2022-07-09 DIAGNOSIS — R570 Cardiogenic shock: Secondary | ICD-10-CM | POA: Diagnosis not present

## 2022-07-09 DIAGNOSIS — E662 Morbid (severe) obesity with alveolar hypoventilation: Secondary | ICD-10-CM | POA: Diagnosis present

## 2022-07-09 DIAGNOSIS — D649 Anemia, unspecified: Secondary | ICD-10-CM | POA: Diagnosis not present

## 2022-07-09 DIAGNOSIS — R390 Extravasation of urine: Secondary | ICD-10-CM | POA: Diagnosis not present

## 2022-07-09 DIAGNOSIS — R000 Tachycardia, unspecified: Secondary | ICD-10-CM | POA: Diagnosis present

## 2022-07-09 DIAGNOSIS — F32A Depression, unspecified: Secondary | ICD-10-CM | POA: Diagnosis present

## 2022-07-09 DIAGNOSIS — J440 Chronic obstructive pulmonary disease with acute lower respiratory infection: Secondary | ICD-10-CM | POA: Diagnosis not present

## 2022-07-09 DIAGNOSIS — E890 Postprocedural hypothyroidism: Secondary | ICD-10-CM | POA: Diagnosis present

## 2022-07-09 DIAGNOSIS — E038 Other specified hypothyroidism: Secondary | ICD-10-CM | POA: Diagnosis not present

## 2022-07-09 DIAGNOSIS — I5A Non-ischemic myocardial injury (non-traumatic): Secondary | ICD-10-CM | POA: Diagnosis present

## 2022-07-09 DIAGNOSIS — F05 Delirium due to known physiological condition: Secondary | ICD-10-CM | POA: Diagnosis not present

## 2022-07-09 DIAGNOSIS — G4733 Obstructive sleep apnea (adult) (pediatric): Secondary | ICD-10-CM | POA: Diagnosis not present

## 2022-07-09 DIAGNOSIS — N3000 Acute cystitis without hematuria: Secondary | ICD-10-CM

## 2022-07-09 DIAGNOSIS — D631 Anemia in chronic kidney disease: Secondary | ICD-10-CM | POA: Diagnosis present

## 2022-07-09 DIAGNOSIS — J9622 Acute and chronic respiratory failure with hypercapnia: Secondary | ICD-10-CM | POA: Diagnosis not present

## 2022-07-09 DIAGNOSIS — J189 Pneumonia, unspecified organism: Secondary | ICD-10-CM | POA: Diagnosis not present

## 2022-07-09 DIAGNOSIS — J810 Acute pulmonary edema: Secondary | ICD-10-CM | POA: Diagnosis not present

## 2022-07-09 DIAGNOSIS — Y95 Nosocomial condition: Secondary | ICD-10-CM | POA: Diagnosis not present

## 2022-07-09 DIAGNOSIS — I130 Hypertensive heart and chronic kidney disease with heart failure and stage 1 through stage 4 chronic kidney disease, or unspecified chronic kidney disease: Secondary | ICD-10-CM | POA: Diagnosis present

## 2022-07-09 DIAGNOSIS — E785 Hyperlipidemia, unspecified: Secondary | ICD-10-CM | POA: Diagnosis present

## 2022-07-09 DIAGNOSIS — Z66 Do not resuscitate: Secondary | ICD-10-CM | POA: Diagnosis present

## 2022-07-09 DIAGNOSIS — G9341 Metabolic encephalopathy: Secondary | ICD-10-CM | POA: Diagnosis not present

## 2022-07-09 DIAGNOSIS — Z1152 Encounter for screening for COVID-19: Secondary | ICD-10-CM | POA: Diagnosis not present

## 2022-07-09 DIAGNOSIS — Y92239 Unspecified place in hospital as the place of occurrence of the external cause: Secondary | ICD-10-CM | POA: Diagnosis not present

## 2022-07-09 DIAGNOSIS — J918 Pleural effusion in other conditions classified elsewhere: Secondary | ICD-10-CM | POA: Diagnosis present

## 2022-07-09 DIAGNOSIS — J90 Pleural effusion, not elsewhere classified: Secondary | ICD-10-CM | POA: Diagnosis not present

## 2022-07-09 DIAGNOSIS — E1129 Type 2 diabetes mellitus with other diabetic kidney complication: Secondary | ICD-10-CM | POA: Diagnosis present

## 2022-07-09 DIAGNOSIS — I5020 Unspecified systolic (congestive) heart failure: Secondary | ICD-10-CM | POA: Diagnosis not present

## 2022-07-09 DIAGNOSIS — Z6841 Body Mass Index (BMI) 40.0 and over, adult: Secondary | ICD-10-CM | POA: Diagnosis not present

## 2022-07-09 DIAGNOSIS — E1140 Type 2 diabetes mellitus with diabetic neuropathy, unspecified: Secondary | ICD-10-CM | POA: Diagnosis not present

## 2022-07-09 DIAGNOSIS — R6510 Systemic inflammatory response syndrome (SIRS) of non-infectious origin without acute organ dysfunction: Principal | ICD-10-CM

## 2022-07-09 DIAGNOSIS — E1122 Type 2 diabetes mellitus with diabetic chronic kidney disease: Secondary | ICD-10-CM | POA: Diagnosis not present

## 2022-07-09 DIAGNOSIS — R410 Disorientation, unspecified: Secondary | ICD-10-CM | POA: Diagnosis not present

## 2022-07-09 DIAGNOSIS — R7989 Other specified abnormal findings of blood chemistry: Secondary | ICD-10-CM | POA: Diagnosis not present

## 2022-07-09 LAB — CBG MONITORING, ED
Glucose-Capillary: 75 mg/dL (ref 70–99)
Glucose-Capillary: 84 mg/dL (ref 70–99)
Glucose-Capillary: 84 mg/dL (ref 70–99)
Glucose-Capillary: 86 mg/dL (ref 70–99)

## 2022-07-09 MED ORDER — VITAMIN D3 25 MCG (1000 UNIT) PO TABS
1000.0000 [IU] | ORAL_TABLET | Freq: Every day | ORAL | Status: DC
  Administered 2022-07-09 – 2022-07-17 (×9): 1000 [IU] via ORAL
  Filled 2022-07-09 (×17): qty 1

## 2022-07-09 MED ORDER — POTASSIUM CHLORIDE CRYS ER 20 MEQ PO TBCR: 20.0000 meq | EXTENDED_RELEASE_TABLET | Freq: Every day | ORAL | Status: DC

## 2022-07-09 MED ORDER — ONDANSETRON HCL 4 MG PO TABS: 4.0000 mg | ORAL_TABLET | Freq: Four times a day (QID) | ORAL | Status: DC | PRN

## 2022-07-09 MED ORDER — INSULIN ASPART 100 UNIT/ML IJ SOLN: 0.0000 [IU] | Freq: Every day | INTRAMUSCULAR | Status: DC

## 2022-07-09 MED ORDER — DILTIAZEM HCL-DEXTROSE 125-5 MG/125ML-% IV SOLN (PREMIX): 5.0000 mg/h | INTRAVENOUS | Status: DC

## 2022-07-09 MED ORDER — ACETAMINOPHEN 650 MG RE SUPP: 650.0000 mg | Freq: Four times a day (QID) | RECTAL | Status: DC | PRN

## 2022-07-09 MED ORDER — TRAZODONE HCL 50 MG PO TABS: 25.0000 mg | ORAL_TABLET | Freq: Every evening | ORAL | Status: DC | PRN

## 2022-07-09 MED ORDER — PROPRANOLOL HCL 20 MG PO TABS
20.0000 mg | ORAL_TABLET | Freq: Two times a day (BID) | ORAL | Status: DC
  Administered 2022-07-09 – 2022-07-10 (×2): 20 mg via ORAL
  Filled 2022-07-09 (×3): qty 1

## 2022-07-09 MED ORDER — BUPROPION HCL ER (XL) 150 MG PO TB24
150.0000 mg | ORAL_TABLET | Freq: Every day | ORAL | Status: DC
  Administered 2022-07-09 – 2022-07-17 (×9): 150 mg via ORAL
  Filled 2022-07-09 (×9): qty 1

## 2022-07-09 MED ORDER — ALPRAZOLAM 0.25 MG PO TABS: 0.2500 mg | ORAL_TABLET | Freq: Three times a day (TID) | ORAL | Status: DC | PRN

## 2022-07-09 MED ORDER — DOCUSATE SODIUM 100 MG PO CAPS
100.0000 mg | ORAL_CAPSULE | Freq: Two times a day (BID) | ORAL | Status: DC | PRN
  Administered 2022-07-11: 100 mg via ORAL
  Filled 2022-07-09: qty 1

## 2022-07-09 MED ORDER — PANTOPRAZOLE SODIUM 40 MG PO TBEC
40.0000 mg | DELAYED_RELEASE_TABLET | Freq: Every day | ORAL | Status: DC
  Administered 2022-07-09 – 2022-07-17 (×9): 40 mg via ORAL
  Filled 2022-07-09 (×9): qty 1

## 2022-07-09 MED ORDER — INSULIN ASPART 100 UNIT/ML IJ SOLN: 0.0000 [IU] | Freq: Three times a day (TID) | INTRAMUSCULAR | Status: DC

## 2022-07-09 MED ORDER — ORAL CARE MOUTH RINSE: 15.0000 mL | OROMUCOSAL | Status: DC | PRN

## 2022-07-09 MED ORDER — ACETAMINOPHEN 325 MG PO TABS
650.0000 mg | ORAL_TABLET | Freq: Four times a day (QID) | ORAL | Status: DC | PRN
  Administered 2022-07-12 – 2022-07-14 (×4): 650 mg via ORAL
  Filled 2022-07-09 (×4): qty 2

## 2022-07-09 MED ORDER — ADULT MULTIVITAMIN W/MINERALS CH
1.0000 | ORAL_TABLET | Freq: Every day | ORAL | Status: DC
  Administered 2022-07-09 – 2022-07-17 (×9): 1 via ORAL
  Filled 2022-07-09 (×9): qty 1

## 2022-07-09 MED ORDER — BIOTIN 5 MG PO TABS: 2.5000 mg | ORAL_TABLET | Freq: Every day | ORAL | Status: DC

## 2022-07-09 MED ORDER — ONDANSETRON HCL 4 MG/2ML IJ SOLN: 4.0000 mg | Freq: Four times a day (QID) | INTRAMUSCULAR | Status: DC | PRN

## 2022-07-09 MED ORDER — SODIUM CHLORIDE 0.9 % IV SOLN: 2.0000 g | INTRAVENOUS | Status: DC

## 2022-07-09 MED ORDER — CYCLOBENZAPRINE HCL 10 MG PO TABS
5.0000 mg | ORAL_TABLET | Freq: Three times a day (TID) | ORAL | Status: DC | PRN
  Administered 2022-07-14 (×2): 5 mg via ORAL
  Filled 2022-07-09 (×2): qty 1

## 2022-07-09 MED ORDER — MAGNESIUM HYDROXIDE 400 MG/5ML PO SUSP: 30.0000 mL | Freq: Every day | ORAL | Status: DC | PRN

## 2022-07-09 MED ORDER — METOPROLOL TARTRATE 5 MG/5ML IV SOLN
5.0000 mg | INTRAVENOUS | Status: DC | PRN
  Administered 2022-07-09: 5 mg via INTRAVENOUS
  Filled 2022-07-09: qty 5

## 2022-07-09 MED ORDER — ZINC SULFATE 220 (50 ZN) MG PO CAPS
220.0000 mg | ORAL_CAPSULE | Freq: Every day | ORAL | Status: DC
  Administered 2022-07-09 – 2022-07-17 (×9): 220 mg via ORAL
  Filled 2022-07-09 (×9): qty 1

## 2022-07-09 MED ORDER — APIXABAN 5 MG PO TABS
5.0000 mg | ORAL_TABLET | Freq: Two times a day (BID) | ORAL | Status: DC
  Administered 2022-07-09 – 2022-07-17 (×17): 5 mg via ORAL
  Filled 2022-07-09 (×17): qty 1

## 2022-07-09 MED ORDER — SODIUM CHLORIDE 0.9 % IV SOLN
1.0000 g | Freq: Three times a day (TID) | INTRAVENOUS | Status: DC
  Administered 2022-07-09 – 2022-07-13 (×13): 1 g via INTRAVENOUS
  Filled 2022-07-09 (×14): qty 20

## 2022-07-09 MED ORDER — DILTIAZEM HCL-DEXTROSE 125-5 MG/125ML-% IV SOLN (PREMIX)
5.0000 mg/h | INTRAVENOUS | Status: DC
  Administered 2022-07-09: 10 mg/h via INTRAVENOUS
  Administered 2022-07-09: 5 mg/h via INTRAVENOUS
  Administered 2022-07-10: 7.5 mg/h via INTRAVENOUS
  Filled 2022-07-09 (×3): qty 125

## 2022-07-09 MED ORDER — LEVOTHYROXINE SODIUM 50 MCG PO TABS
250.0000 ug | ORAL_TABLET | Freq: Every day | ORAL | Status: DC
  Administered 2022-07-09 – 2022-07-17 (×9): 250 ug via ORAL
  Filled 2022-07-09 (×2): qty 1
  Filled 2022-07-09: qty 5
  Filled 2022-07-09 (×2): qty 1
  Filled 2022-07-09: qty 5
  Filled 2022-07-09 (×4): qty 1

## 2022-07-09 MED ORDER — ROPINIROLE HCL 1 MG PO TABS
3.0000 mg | ORAL_TABLET | Freq: Every day | ORAL | Status: DC
  Administered 2022-07-09 – 2022-07-16 (×8): 3 mg via ORAL
  Filled 2022-07-09 (×8): qty 3

## 2022-07-09 MED ORDER — HYDRALAZINE HCL 20 MG/ML IJ SOLN: 5.0000 mg | INTRAMUSCULAR | Status: DC | PRN

## 2022-07-09 MED ORDER — ATORVASTATIN CALCIUM 20 MG PO TABS
20.0000 mg | ORAL_TABLET | Freq: Every day | ORAL | Status: DC
  Administered 2022-07-09 – 2022-07-17 (×9): 20 mg via ORAL
  Filled 2022-07-09 (×9): qty 1

## 2022-07-09 MED ORDER — ADULT MULTIVITAMIN W/MINERALS CH
1.0000 | ORAL_TABLET | Freq: Every day | ORAL | Status: DC
  Filled 2022-07-09: qty 1

## 2022-07-09 MED ORDER — POLYETHYLENE GLYCOL 3350 17 G PO PACK: 17.0000 g | PACK | Freq: Every day | ORAL | Status: DC | PRN

## 2022-07-09 MED ORDER — GABAPENTIN 100 MG PO CAPS
200.0000 mg | ORAL_CAPSULE | Freq: Three times a day (TID) | ORAL | Status: DC
  Administered 2022-07-09 – 2022-07-17 (×25): 200 mg via ORAL
  Filled 2022-07-09 (×25): qty 2

## 2022-07-09 MED ORDER — FERROUS SULFATE 325 (65 FE) MG PO TABS
325.0000 mg | ORAL_TABLET | Freq: Every day | ORAL | Status: DC
  Administered 2022-07-09 – 2022-07-17 (×9): 325 mg via ORAL
  Filled 2022-07-09 (×9): qty 1

## 2022-07-09 MED ORDER — METOLAZONE 2.5 MG PO TABS: 2.5000 mg | ORAL_TABLET | Freq: Every day | ORAL | Status: DC | PRN

## 2022-07-09 MED ORDER — DILTIAZEM HCL 60 MG PO TABS: 60.0000 mg | ORAL_TABLET | Freq: Three times a day (TID) | ORAL | Status: DC

## 2022-07-09 MED ORDER — PROPRANOLOL HCL 20 MG PO TABS: 20.0000 mg | ORAL_TABLET | Freq: Two times a day (BID) | ORAL | Status: DC

## 2022-07-09 NOTE — Progress Notes (Signed)
Patient seen for cpap. Noted to be on o2 in no distress. Asked patient if she was ready to go on cpap. Patient has stated she has unit that she uses off and on. States is something that she does not wish to use tonight. Asked her to have RN to call if she changes her mind. Will continue to monitor.

## 2022-07-09 NOTE — H&P (Signed)
History and Physical    Madison Mcbride ZPH:[id number] DOB: [birth year] DOA: 06/19/2022  Referring MD/NP/PA:   PCP: Rusty Aus, MD   Patient coming from:  The patient is coming from SNF  Chief Complaint: Dysuria, tachycardia  HPI: Madison Mcbride is a 66 y.o. female with medical history significant of A fib on Eliquis, dCHF, hyperlipidemia, diabetes mellitus, hypothyroidism, depression, thyroid cancer, morbid obesity, chronic back pain, OSA on CPAP, CKD-IIIa, who presents with shortness breath.   Patient was initially brought in due to tachycardia.  Heart rate up to 120-130s in facility.  Patient denies chest pain, cough, shortness of breath.  No fever or chills.  Denies nausea, vomiting, diarrhea or abdominal pain.  No symptoms of UTI.  She states she has mild dysuria and mild burning on urination in the past several days.  No hematuria.  She said that she uses 3L oxygen as needed at home.  Data reviewed independently and ED Course: pt was found to have WBC 12.6, lactic acid 1.7, INR 2.3, PTT 44, positive urinalysis (cloudy appearance, large amount of leukocyte, many bacteria, WBC> 50), negative COVID PCR and flu PCR.  Temperature normal, blood pressure 122/63, heart rate 130s, RR 22, oxygen saturation 96% on 42 oxygen, chest x-ray showed cardiomegaly without infiltration.  Patient is admitted to PCU as inpatient.  EKG: I have personally reviewed.  QTc 489, heart rate 126, LAD, poor R wave progression,   Review of Systems:   General: no fevers, chills, no body weight gain, has fatigue HEENT: no blurry vision, hearing changes or sore throat Respiratory: no dyspnea, coughing, wheezing CV: no chest pain, no palpitations GI: no nausea, vomiting, abdominal pain, diarrhea, constipation GU: has mild dysuria, burning on urination, no increased urinary frequency, hematuria  Ext: no leg edema Neuro: no unilateral weakness, numbness, or tingling, no vision change or hearing  loss Skin: no rash, no skin tear. MSK: No muscle spasm, no deformity, no limitation of range of movement in spin Heme: No easy bruising.  Travel history: No recent long distant travel.   Allergy: No Known Allergies  Past Medical History:  Diagnosis Date   CHF (congestive heart failure) (HCC)    Diabetes mellitus without complication (HCC)    Hypothyroidism    Lymphedema    RLS (restless legs syndrome)    Sleep apnea    Thyroid cancer ([place]) 2007    Past Surgical History:  Procedure Laterality Date   ABDOMINAL HYSTERECTOMY     COLONOSCOPY WITH PROPOFOL N/A 08/26/2015   Procedure: COLONOSCOPY WITH PROPOFOL;  Surgeon: Manya Silvas, MD;  Location: [facility];  Service: Endoscopy;  Laterality: N/A;   THYROIDECTOMY      Social History:  reports that she has never smoked. She has never used smokeless tobacco. She reports that she does not drink alcohol and does not use drugs.  Family History:  Family History  Problem Relation Age of Onset   Autoimmune disease Sister    Breast cancer Neg Hx      Prior to Admission medications   Medication Sig Start Date End Date Taking? Authorizing Provider  acetaminophen (TYLENOL) 650 MG CR tablet Take 1,300 mg by mouth every 8 (eight) hours as needed for pain.    [provider]  ALPRAZolam Duanne Moron) 0.25 MG tablet Take 1 tablet (0.25 mg total) by mouth 3 (three) times daily as needed for anxiety. 05/29/22   Sreenath, Trula Slade, MD  apixaban (ELIQUIS) 5 MG TABS tablet Take 1 tablet (5  mg total) by mouth 2 (two) times daily. 07/25/21   Dessa Phi, DO  atorvastatin (LIPITOR) 20 MG tablet Take 20 mg by mouth daily. 01/31/20   [provider]  buPROPion (WELLBUTRIN XL) 150 MG 24 hr tablet Take 150 mg by mouth daily. 01/10/21   [provider]  Catheter Self-Adhesive Urinary MISC Continue PurWIck prn if available 03/18/22   Emeterio Reeve, DO  Cholecalciferol 25 MCG (1000 UT) tablet Take 1 tablet by mouth daily.     [provider]  docusate sodium (COLACE) 100 MG capsule Take 1 capsule (100 mg total) by mouth 2 (two) times daily as needed for mild constipation. 03/18/22   Emeterio Reeve, DO  furosemide (LASIX) 40 MG tablet Take 1 tablet (40 mg total) by mouth 2 (two) times daily. Increase to 1 tablet (40 mg total) by mouth THREE TIMES daily (total daily dose 120 mg) as needed for up to 3 days for increased leg swelling, shortness of breath, weight gain 5+ lbs over 1-2 days. Seek medical care if these symptoms are not improving with increased dose. Reduce dose to 1 tablet (40 mg total) by mouth ONCE DAILY if dizziness or low blood pressure on bid dose. 03/18/22   Emeterio Reeve, DO  gabapentin (NEURONTIN) 100 MG capsule Take 2 capsules (200 mg total) by mouth 3 (three) times daily. 05/29/22   Sidney Ace, MD  Iron, Ferrous Sulfate, 325 (65 Fe) MG TABS Take 325 mg by mouth daily at 12 noon. 05/29/22   Sidney Ace, MD  levothyroxine (SYNTHROID) 125 MCG tablet Take 1 tablet (125 mcg total) by mouth daily. Patient taking differently: Take 250 mcg by mouth daily. 07/26/21   Dessa Phi, DO  metolazone (ZAROXOLYN) 2.5 MG tablet Take 2.5 mg by mouth daily as needed (if weight up > 5 pounds).    [provider]  Mouthwashes (MOUTH RINSE) LIQD solution 15 mLs by Mouth Rinse route as needed (oral care). 03/18/22   Emeterio Reeve, DO  Multiple Vitamin (MULTIVITAMIN) tablet Take 1 tablet by mouth daily.    [provider]  pantoprazole (PROTONIX) 40 MG tablet Take 40 mg by mouth daily. 01/11/21   [provider]  polyethylene glycol (MIRALAX / GLYCOLAX) 17 g packet Take 17 g by mouth daily as needed for moderate constipation. 03/18/22   Emeterio Reeve, DO  potassium chloride SA (KLOR-CON M) 20 MEQ tablet Take 20 mEq by mouth daily. 12/15/21   [provider]  propranolol (INDERAL) 20 MG tablet Take 1 tablet (20 mg total) by mouth 2 (two) times daily. 05/29/22    Sidney Ace, MD  rOPINIRole (REQUIP) 3 MG tablet Take 1 tablet (3 mg total) by mouth at bedtime. 03/18/22   Emeterio Reeve, DO  Semaglutide, 1 MG/DOSE, 2 MG/1.5ML SOPN Inject 0.75 mLs into the skin once a week. 10/14/20   [provider]  zinc gluconate 50 MG tablet Take 50 mg by mouth daily.    [provider]    Physical Exam: Vitals:   06/20/2022 1330 06/13/2022 1400 07/03/2022 1703 06/22/2022 1807  BP: 100/64 107/65 93/67 104/74  Pulse: 69 89 90 84  Resp: 20 (!) '23 18 19  '$ Temp:   98.1 F (36.7 C) 97.8 F (36.6 C)  TempSrc:   Oral Oral  SpO2: 97% 94% 95% 95%  Weight:      Height:       General: Not in acute distress HEENT:       Eyes: PERRL, EOMI,  no scleral icterus.       ENT: No discharge from the ears and nose, no pharynx injection, no tonsillar enlargement.        Neck: No JVD, no bruit, no mass felt. Heme: No neck lymph node enlargement. Cardiac: S1/S2, RRR, No murmurs, No gallops or rubs. Respiratory: No rales, wheezing, rhonchi or rubs. GI: Soft, nondistended, nontender, no rebound pain, no organomegaly, BS present. GU: No hematuria Ext: No pitting leg edema bilaterally. 1+DP/PT pulse bilaterally. Musculoskeletal: No joint deformities, No joint redness or warmth, no limitation of ROM in spin. Skin: No rashes.  Neuro: Alert, oriented X3, cranial nerves II-XII grossly intact, moves all extremities normally. Psych: Patient is not psychotic, no suicidal or hemocidal ideation.  Labs on Admission: I have personally reviewed following labs and imaging studies  CBC: Recent Labs  Lab 07/08/22 1924  WBC 12.6*  NEUTROABS 10.3*  HGB 11.1*  HCT 39.4  MCV 88.3  PLT 397   Basic Metabolic Panel: Recent Labs  Lab 07/08/22 1924  NA 141  K 4.2  CL 97*  CO2 33*  GLUCOSE 96  BUN 47*  CREATININE 1.39*  CALCIUM 8.6*   GFR: Estimated Creatinine Clearance: 53 mL/min (A) (by C-G formula based on SCr of 1.39 mg/dL (H)). Liver Function Tests: Recent  Labs  Lab 07/08/22 1924  AST 28  ALT 14  ALKPHOS 110  BILITOT 1.6*  PROT 7.4  ALBUMIN 3.0*   No results for input(s): "LIPASE", "AMYLASE" in the last 168 hours. No results for input(s): "AMMONIA" in the last 168 hours. Coagulation Profile: Recent Labs  Lab 07/08/22 2250  INR 2.3*   Cardiac Enzymes: No results for input(s): "CKTOTAL", "CKMB", "CKMBINDEX", "TROPONINI" in the last 168 hours. BNP (last 3 results) No results for input(s): "PROBNP" in the last 8760 hours. HbA1C: No results for input(s): "HGBA1C" in the last 72 hours. CBG: Recent Labs  Lab 07/07/2022 0832 06/24/2022 1146 06/12/2022 1646  GLUCAP 75 84 84   Lipid Profile: No results for input(s): "CHOL", "HDL", "LDLCALC", "TRIG", "CHOLHDL", "LDLDIRECT" in the last 72 hours. Thyroid Function Tests: No results for input(s): "TSH", "T4TOTAL", "FREET4", "T3FREE", "THYROIDAB" in the last 72 hours. Anemia Panel: No results for input(s): "VITAMINB12", "FOLATE", "FERRITIN", "TIBC", "IRON", "RETICCTPCT" in the last 72 hours. Urine analysis:    Component Value Date/Time   COLORURINE AMBER (A) 07/08/2022 2201   APPEARANCEUR CLOUDY (A) 07/08/2022 2201   APPEARANCEUR CLOUDY 01/15/2014 0209   LABSPEC 1.015 07/08/2022 2201   LABSPEC 1.014 01/15/2014 0209   PHURINE 5.0 07/08/2022 2201   GLUCOSEU NEGATIVE 07/08/2022 2201   GLUCOSEU NEGATIVE 01/15/2014 0209   HGBUR MODERATE (A) 07/08/2022 2201   BILIRUBINUR NEGATIVE 07/08/2022 2201   BILIRUBINUR NEGATIVE 01/15/2014 0209   KETONESUR NEGATIVE 07/08/2022 2201   PROTEINUR NEGATIVE 07/08/2022 2201   NITRITE NEGATIVE 07/08/2022 2201   LEUKOCYTESUR LARGE (A) 07/08/2022 2201   LEUKOCYTESUR 2+ 01/15/2014 0209   Sepsis Labs: '@LABRCNTIP'$ (procalcitonin:4,lacticidven:4) ) Recent Results (from the past 240 hour(s))  Culture, blood (Routine x 2)     Status: None (Preliminary result)   Collection Time: 07/08/22  7:39 PM   Specimen: BLOOD  Result Value Ref Range Status   Specimen  Description BLOOD BLOOD RIGHT ARM  Final   Special Requests   Final    BOTTLES DRAWN AEROBIC AND ANAEROBIC Blood Culture adequate volume   Culture   Final    NO GROWTH < 12 HOURS Performed at [facility], [address] [place],  [address]  Report Status PENDING  Incomplete  Culture, blood (Routine x 2)     Status: None (Preliminary result)   Collection Time: 07/08/22  7:47 PM   Specimen: BLOOD  Result Value Ref Range Status   Specimen Description BLOOD BLOOD LEFT ARM  Final   Special Requests   Final    BOTTLES DRAWN AEROBIC AND ANAEROBIC Blood Culture adequate volume   Culture   Final    NO GROWTH < 12 HOURS Performed at [facility], [address], [place], [place] [address]    Report Status PENDING  Incomplete  Resp panel by RT-PCR (RSV, Flu A&B, Covid) Anterior Nasal Swab     Status: None   Collection Time: 07/08/22  7:47 PM   Specimen: Anterior Nasal Swab  Result Value Ref Range Status   SARS Coronavirus 2 by RT PCR NEGATIVE NEGATIVE Final    Comment: (NOTE) SARS-CoV-2 target nucleic acids are NOT DETECTED.  The SARS-CoV-2 RNA is generally detectable in upper respiratory specimens during the acute phase of infection. The lowest concentration of SARS-CoV-2 viral copies this assay can detect is 138 copies/mL. A negative result does not preclude SARS-Cov-2 infection and should not be used as the sole basis for treatment or other patient management decisions. A negative result may occur with  improper specimen collection/handling, submission of specimen other than nasopharyngeal swab, presence of viral mutation(s) within the areas targeted by this assay, and inadequate number of viral copies(<138 copies/mL). A negative result must be combined with clinical observations, patient history, and epidemiological information. The expected result is Negative.  Fact Sheet for Patients:  EntrepreneurPulse.com.au  Fact Sheet for  Healthcare Providers:  IncredibleEmployment.be  This test is no t yet approved or cleared by the [place] FDA and  has been authorized for detection and/or diagnosis of SARS-CoV-2 by FDA under an Emergency Use Authorization (EUA). This EUA will remain  in effect (meaning this test can be used) for the duration of the COVID-19 declaration under Section 564(b)(1) of the Act, 21 U.S.C.section 360bbb-3(b)(1), unless the authorization is terminated  or revoked sooner.       Influenza A by PCR NEGATIVE NEGATIVE Final   Influenza B by PCR NEGATIVE NEGATIVE Final    Comment: (NOTE) The Xpert Xpress SARS-CoV-2/FLU/RSV plus assay is intended as an aid in the diagnosis of influenza from Nasopharyngeal swab specimens and should not be used as a sole basis for treatment. Nasal washings and aspirates are unacceptable for Xpert Xpress SARS-CoV-2/FLU/RSV testing.  Fact Sheet for Patients: EntrepreneurPulse.com.au  Fact Sheet for Healthcare Providers: IncredibleEmployment.be  This test is not yet approved or cleared by the [place] FDA and has been authorized for detection and/or diagnosis of SARS-CoV-2 by FDA under an Emergency Use Authorization (EUA). This EUA will remain in effect (meaning this test can be used) for the duration of the COVID-19 declaration under Section 564(b)(1) of the Act, 21 U.S.C. section 360bbb-3(b)(1), unless the authorization is terminated or revoked.     Resp Syncytial Virus by PCR NEGATIVE NEGATIVE Final    Comment: (NOTE) Fact Sheet for Patients: EntrepreneurPulse.com.au  Fact Sheet for Healthcare Providers: IncredibleEmployment.be  This test is not yet approved or cleared by the [place] FDA and has been authorized for detection and/or diagnosis of SARS-CoV-2 by FDA under an Emergency Use Authorization (EUA). This EUA will remain in effect (meaning this  test can be used) for the duration of the COVID-19 declaration under Section 564(b)(1) of the Act, 21 U.S.C. section 360bbb-3(b)(1), unless  the authorization is terminated or revoked.  Performed at [facility], [address], [place], [place] [address]      Radiological Exams on Admission: DG Chest [facility] 1 View  Result Date: 07/08/2022 CLINICAL DATA:  Tachycardia, possible sepsis EXAM: PORTABLE CHEST 1 VIEW COMPARISON:  05/27/2022 FINDINGS: Mild to moderate enlargement of the cardiopericardial silhouette. Mildly improved retrocardiac aeration compared to previous. There is likely some linear subsegmental atelectasis in both lower lobes. Mild indistinctness of the pulmonary vasculature suggesting pulmonary venous hypertension. No compelling findings of pneumonia. Thoracic spondylosis noted. IMPRESSION: 1. Mild to moderate enlargement of the cardiopericardial silhouette with pulmonary venous hypertension but no overt edema. 2. Mild subsegmental atelectasis in both lower lobes. 3. Thoracic spondylosis. 4. Mildly improved retrocardiac aeration compared to previous. Electronically Signed   By: Van Clines M.D.   On: 07/08/2022 20:12      Assessment/Plan Principal Problem:   UTI (urinary tract infection) Active Problems:   Sepsis ([place])   Myocardial injury   Atrial fibrillation with RVR (HCC)   Chronic systolic CHF (congestive heart failure) (HCC)   HTN (hypertension)   Type II diabetes mellitus with renal manifestations (HCC)   HLD (hyperlipidemia)   Iron deficiency anemia   Chronic kidney disease, stage 3a ([place])   Hypothyroidism   Depression with anxiety   Morbid obesity with BMI of 50.0-59.9, adult (HCC)   Assessment and Plan:  UTI (urinary tract infection) and sepsis: Patient admits criteria for sepsis with WBC 12.6, tachycardia with heart rate up to 130, RR 22.  Lactic acid is normal.  -Admitted to PCU as inpatient -Switch Rocephin to meropenem since patient  has history of positive culture with multi antibiotic resistant Klebsiella -Follow-up of blood culture and urine culture -IV fluid: 1.8 L LR -Check procalcitonin level  Myocardial injury: Troponin level 38 --> 36 -Check A1c, FLP -Continue Lipitor  Chronic systolic CHF: Today, 7/84/6962 showed EF of 45-50%.  Patient does not have leg edema or JVD.  CHF seem to be compensated. -Check BNP -Hold Lasix due to sepsis and worsening renal function   Atrial fibrillation with RVR ([place]): -Start Cardizem drip -Continue Eliquis -Patient is also on propranolol  HTN (hypertension) -IV hydralazine as needed -Patient is on Cardizem drip  Type II diabetes mellitus with renal manifestations [facility]): Recent A1c 6.4, well-controlled.  Patient taking semaglutide at home -SSI  HLD (hyperlipidemia) -Lipitor  Iron deficiency anemia: Hemoglobin stable 11.1 -Continue iron supplement  Chronic kidney disease, stage 3a ([place]): Slightly worsened than baseline.  Baseline creatinine 1.  Right forearm 05/29/2022.  Her creatinine is 1.3 9, BUN 47, GFR 41 -IV fluid as above -Hold Lasix  Hypothyroidism -Synthroid  Depression with anxiety -Continue home medications  Morbid obesity with BMI of 50.0-59.9, adult ([place]): Body weight 134.7 kg, BMI 50.98 -Exercise, healthy diet -Encourage losing weight    DVT ppx: on Eliquis  Code Status: Full code  Family Communication: I offered to call her family, but patient states that her husband was with her earlier. She does not need me to call her family.  Disposition Plan:  Anticipate discharge back to previous environment, SNF  Consults called:  none  Admission status and Level of care: Progressive:   as inpt       Dispo: The patient is from: SNF              Anticipated d/c is to: SNF              Anticipated d/c date  is: 2 days              Patient currently is not medically stable to d/c.    Severity of Illness:  The appropriate patient status for  this patient is INPATIENT. Inpatient status is judged to be reasonable and necessary in order to provide the required intensity of service to ensure the patient's safety. The patient's presenting symptoms, physical exam findings, and initial radiographic and laboratory data in the context of their chronic comorbidities is felt to place them at high risk for further clinical deterioration. Furthermore, it is not anticipated that the patient will be medically stable for discharge from the hospital within 2 midnights of admission.   * I certify that at the point of admission it is my clinical judgment that the patient will require inpatient hospital care spanning beyond 2 midnights from the point of admission due to high intensity of service, high risk for further deterioration and high frequency of surveillance required.*       Date of Service 07/02/2022    [facility] [place]   If 7PM-7AM, please contact night-coverage [email or web address] 06/17/2022, 7:33 PM

## 2022-07-09 NOTE — TOC Initial Note (Signed)
Transition of Care [facility]) - Initial/Assessment Note    Patient Details  Name: Madison Mcbride MRN: [id number] Date of Birth: Feb 16, 1956  Transition of Care [facility]) CM/SW Contact:    Candie Chroman, LCSW Phone Number: 06/13/2022, 12:37 PM  Clinical Narrative:  CSW met with patient. Husband at bedside. CSW introduced role and explained that discharge planning would be discussed. Patient was admitted from [facility] where she was receiving rehab. Insurance recently stopped coverage so they have been paying privately. Husband also paid for a bed hold today. Sent secure chat to MD requesting PT/OT consults so we can try and get insurance approval for move covered days. No further concerns. CSW encouraged patient and her husband to contact CSW as needed. CSW will continue to follow patient and her husband for support and facilitate discharge to SNF once medically stable.                Expected Discharge Plan: Skilled Nursing Facility Barriers to Discharge: Ship broker, Continued Medical Work up   Patient Goals and CMS Choice     Choice offered to / list presented to : Patient, Spouse      Expected Discharge Plan and Services     Post Acute Care Choice: Resumption of Svcs/PTA Provider Living arrangements for the past 2 months: [place], [place]                                      Prior Living Arrangements/Services Living arrangements for the past 2 months: [place], [place] Lives with:: Spouse Patient language and need for interpreter reviewed:: Yes Do you feel safe going back to the place where you live?: Yes      Need for Family Participation in Patient Care: Yes (Comment) Care giver support system in place?: Yes (comment) Current home services: DME Criminal Activity/Legal Involvement Pertinent to Current Situation/Hospitalization: No - Comment as needed  Activities of Daily Living       Permission Sought/Granted Permission sought to share information with : Facility Sport and exercise psychologist, Family Supports Permission granted to share information with : Yes, Verbal Permission Granted  Share Information with NAME: Madison Mcbride  Permission granted to share info w AGENCY: [facility]  Permission granted to share info w Relationship: Spouse  Permission granted to share info w Contact Information: [phone]  Emotional Assessment Appearance:: Appears stated age Attitude/Demeanor/Rapport: Engaged, Gracious Affect (typically observed): Accepting, Appropriate, Calm, Pleasant Orientation: : Oriented to Self, Oriented to Place, Oriented to  Time, Oriented to Situation Alcohol / Substance Use: Not Applicable Psych Involvement: No (comment)  Admission diagnosis:  Sepsis due to gram-negative UTI ([place]) [A41.50, N39.0] Atrial fibrillation with RVR (HCC) [I48.91] Patient Active Problem List   Diagnosis Date Noted   UTI (urinary tract infection) 06/29/2022   HTN (hypertension) 06/20/2022   Type II diabetes mellitus with renal manifestations ([place]) 06/16/2022   Depression with anxiety 06/20/2022   Iron deficiency anemia 06/18/2022   Sepsis ([place]) 07/04/2022   Myocardial injury 06/30/2022   Atrial fibrillation with RVR ([place]) 07/04/2022   Acute kidney injury ([place]) 05/20/2022   Chronic hypoxic respiratory failure, on home oxygen therapy ([place]) 05/20/2022   Congestive heart failure (CHF) ([place]) 05/20/2022   Microcytic anemia 05/20/2022   Chronic venous stasis dermatitis of both lower extremities 05/20/2022   Pressure ulcer of buttock 05/20/2022   Inflammatory arthritis 05/20/2022  Sinus bradycardia 05/20/2022   Dysuria 05/20/2022   PAF (paroxysmal atrial fibrillation) ([place]) 03/08/2022   Acute on chronic respiratory failure with hypoxia and hypercapnia (HCC) 03/05/2022   Effusion of knee joint, left 03/22/2021   Acute on chronic systolic CHF (congestive heart failure)  ([place]) 03/19/2021   Acute on chronic heart failure ([place]) 03/14/2021   Acute hypoxemic respiratory failure ([place]) 03/14/2021   Diabetes mellitus without complication ([place]) 79/98/7215   HLD (hyperlipidemia) 04/09/2020   Depression 04/09/2020   Elevated troponin 04/09/2020   OSA (obstructive sleep apnea) 04/09/2020   Peripheral polyneuropathy 02/29/2020   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   Lumbar radiculopathy, acute    Acute midline low back pain without sciatica    Type 2 diabetes mellitus with diabetic neuropathy, without long-term current use of insulin ([place]) 02/21/2020   Hypothyroidism 02/21/2020   Morbid obesity with BMI of 50.0-59.9, adult ([place]) 02/21/2020   CAP (community acquired pneumonia) 02/21/2020   Bilateral cellulitis of lower leg 02/21/2020   Acute respiratory failure with hypoxia and hypercapnia ([place]) 02/21/2020   Acute renal failure superimposed on stage 3a chronic kidney disease ([place]) 02/21/2020   Chronic kidney disease, stage 3a ([place]) 01/27/2020   Transaminitis 01/27/2020   Lymphedema of both lower extremities 08/21/2016   Benign essential hypertension 05/18/2016   PCP:  Rusty Aus, MD Pharmacy:   [place] [place] [address] [place] [address] Phone: [phone] Fax: [phone]     Social Determinants of Health (SDOH) Social History: SDOH Screenings   Food Insecurity: No Food Insecurity (05/21/2022)  Tobacco Use: Low Risk  (06/23/2022)   SDOH Interventions:     Readmission Risk Interventions    03/07/2022    4:12 PM  Readmission Risk Prevention Plan  Transportation Screening Complete  PCP or Specialist Appt within 5-7 Days Complete  Home Care Screening Complete  Medication Review (RN CM) Complete

## 2022-07-09 NOTE — ED Notes (Signed)
Pt voided in bed, cleaned pt and changed bedding, pure wick placed per pt request.

## 2022-07-09 NOTE — ED Notes (Signed)
PT at bedside.

## 2022-07-09 NOTE — Evaluation (Signed)
Physical Therapy Evaluation Patient Details Name: Madison Mcbride MRN: [id number] DOB: 1955/08/25 Today's Date: 06/14/2022  History of Present Illness  Madison Mcbride is a 66 y.o. female  brought to the ED via EMS from SNF for tachycardia x 10-12 hours. Patient states she has been on 3 rounds of abx for UTI. Currently treated for UTI. Pt is being treated for possible  A-Fib/zTachyardia  as per Nurse.  Pt received in bed with spouse by her side agreeable to PT evaluation. Pt was at [facility] since Nov 18th 2023 and came to the ED 2/2 to tachycardia. Pt able to perform transfers with One person assist prior to coming to ED while at baseline prior ot Nov 18th 2023, pt was Modified Independent for household level activity participation with [place] and has a personal attendant for housekeeping and light cooking. [facility] is  [facility]. [facility]. Pt requires max assist of 1 for bed mobility and Max assist to STS for 5 secs. HR remained in 80s at rest and increased to 120 with exertion without any symptoms. BP 107/65.  Pt has generalized weakness contributing to the need for assistance level. Pt will benefit from SNF after  acute care to reduce caregiver burden. PT will continue in acute care.   Clinical Impression        Recommendations for follow up therapy are one component of a multi-disciplinary discharge planning process, led by the attending physician.  Recommendations may be updated based on patient status, additional functional criteria and insurance authorization.  Follow Up Recommendations Skilled nursing-short term rehab (<3 hours/day) Can patient physically be transported by private vehicle: No    Assistance Recommended at Discharge Frequent or constant Supervision/Assistance  Patient can return home with the following  Two people to help with walking and/or transfers;A lot of help with  bathing/dressing/bathroom;Assistance with cooking/housework;Assistance with feeding;Direct supervision/assist for medications management;Assist for transportation;Help with stairs or ramp for entrance    Equipment Recommendations None recommended by PT  Recommendations for Other Services  OT consult    Functional Status Assessment Patient has had a recent decline in their functional status and demonstrates the ability to make significant improvements in function in a reasonable and predictable amount of time.     Precautions / Restrictions Precautions Precautions: Fall Restrictions Weight Bearing Restrictions: No      Mobility  Bed Mobility Overal bed mobility: Needs Assistance Bed Mobility: Supine to Sit, Sit to Supine     Supine to sit: Max assist Sit to supine: Max assist   General bed mobility comments: Pt sat on the EOB for 10 mins with sup.    Transfers Overall transfer level: Needs assistance Equipment used: Rolling walker (2 wheels) Transfers: Sit to/from Stand Sit to Stand: Max assist           General transfer comment: For a few seconds.    Ambulation/Gait: deferred               General Gait Details: deferred  Stairs: Deferred            Wheelchair Mobility    Modified Rankin (Stroke Patients Only)       Balance Overall balance assessment: Needs assistance   Sitting balance-Leahy Scale: Good     Standing balance support: Bilateral upper extremity supported, Reliant on assistive device for balance Standing balance-Leahy Scale: Fair Standing balance comment: Pt R knee buckles with  WB.                             Pertinent Vitals/Pain Pain Assessment Pain Assessment: No/denies pain    Home Living Family/patient expects to be discharged to:: Private residence (came from SNF this time.) Living Arrangements: Spouse/significant other (24 hour help.) Available Help at Discharge: Family;Personal care attendant Type of  Home: House Home Access: Stairs to enter Entrance Stairs-Rails: Right;Left;Can reach both Entrance Stairs-Number of Steps: 5   Home Layout: One level Home Equipment: Shower seat;Wheelchair - Engineer, technical sales (4 wheels);Grab bars - tub/shower      Prior Function Prior Level of Function : Independent/Modified Independent             Mobility Comments: Ambulates with rollator short distances at home at baseline; 2 recent falls ADLs Comments: Per provious OT eval "pt reports MOD I in ADL, uses a reacher. does not normally wear socks, just slip on shoes. She has a walk in bathtub. " The personal care attendent helps pt with cookijng and cleaning. Unable to lift or clean the pt.     Hand Dominance   Dominant Hand: Right    Extremity/Trunk Assessment   Upper Extremity Assessment Upper Extremity Assessment: Defer to OT evaluation    Lower Extremity Assessment Lower Extremity Assessment: Generalized weakness       Communication   Communication: No difficulties  Cognition Arousal/Alertness: Awake/alert Behavior During Therapy: WFL for tasks assessed/performed Overall Cognitive Status: Within Functional Limits for tasks assessed                                          General Comments      Exercises     Assessment/Plan    PT Assessment Patient needs continued PT services  PT Problem List Decreased strength;Decreased activity tolerance;Decreased balance;Decreased mobility;Cardiopulmonary status limiting activity;Obesity       PT Treatment Interventions Gait training;Stair training;Functional mobility training;Therapeutic activities;Balance training;Neuromuscular re-education;Patient/family education    PT Goals (Current goals can be found in the Care Plan section)  Acute Rehab PT Goals Patient Stated Goal: " I want to be able to walk to and form bathroom so that I can go home with help." PT Goal Formulation: With patient Time For Goal  Achievement: 07/23/22 Potential to Achieve Goals: Fair    Frequency Min 2X/week     Co-evaluation               AM-PAC PT "6 Clicks" Mobility  Outcome Measure Help needed turning from your back to your side while in a flat bed without using bedrails?: A Little Help needed moving from lying on your back to sitting on the side of a flat bed without using bedrails?: A Lot Help needed moving to and from a bed to a chair (including a wheelchair)?: A Lot Help needed standing up from a chair using your arms (e.g., wheelchair or bedside chair)?: A Lot Help needed to walk in hospital room?: Total Help needed climbing 3-5 steps with a railing? : Total 6 Click Score: 11    End of Session Equipment Utilized During Treatment: Gait belt;Oxygen Activity Tolerance: Patient limited by fatigue;Treatment limited secondary to medical complications (Comment) Patient left: in bed;with call bell/phone within reach;with family/visitor present Nurse Communication: Mobility status PT Visit Diagnosis: Unsteadiness on feet (R26.81);History of falling (Z91.81);Muscle weakness (generalized) (M62.81);Difficulty in  walking, not elsewhere classified (R26.2)    Time: 3552-1747 PT Time Calculation (min) (ACUTE ONLY): 46 min   Charges:   PT Evaluation $PT Eval Moderate Complexity: 1 Mod PT Treatments $Therapeutic Activity: 23-37 mins      Madison Mcbride PT DPT 5:04 PM,06/19/2022

## 2022-07-09 NOTE — ED Notes (Signed)
Md notified and approved increasing dilt gtt

## 2022-07-09 NOTE — Progress Notes (Signed)
Pharmacy Antibiotic Note  Madison Mcbride is a 66 y.o. female admitted on 06/29/2022 with UTI. Hx of recurrent ESBL UTI. Most recently Klebsiella in November 2023. Pharmacy has been consulted for meropenem dosing.  Plan: Start meropenem 1 gram every 8 hours  Follow renal function, de-escalation, and length of therapy.  Height: '5\' 4"'$  (162.6 cm) Weight: 134.7 kg (297 lb) IBW/kg (Calculated) : 54.7  Temp (24hrs), Avg:98.3 F (36.8 C), Min:97.8 F (36.6 C), Max:99.1 F (37.3 C)  Recent Labs  Lab 07/08/22 1924 07/08/22 1939  WBC 12.6*  --   CREATININE 1.39*  --   LATICACIDVEN  --  1.7    Estimated Creatinine Clearance: 53 mL/min (A) (by C-G formula based on SCr of 1.39 mg/dL (H)).    No Known Allergies  Antimicrobials this admission: ceftriaxone 12/27 >>   Dose adjustments this admission:  N/a  Microbiology results: 12/27 BCx: NG < 12 hours 12/27 UCx: to be collected   Thank you for allowing pharmacy to be a part of this patient's care.  Glean Salvo, PharmD, BCPS Clinical Pharmacist  07/11/2022 8:51 AM

## 2022-07-09 NOTE — ED Provider Notes (Signed)
[facility] Provider Note    Event Date/Time   First MD Initiated Contact with Patient 07/07/2022 [phone]     (approximate)   History   Tachycardia   HPI  Madison Mcbride is a 66 y.o. female  brought to the ED via EMS from SNF for tachycardia x 10-12 hours. Endorses chills. Denies fever, cough, cp, sob, abdominal pain, nausea or vomiting.       Past Medical History   Past Medical History:  Diagnosis Date   CHF (congestive heart failure) ([place])    Diabetes mellitus without complication (HCC)    Hypothyroidism    Lymphedema    RLS (restless legs syndrome)    Sleep apnea    Thyroid cancer ([place]) 2007     Active Problem List   Patient Active Problem List   Diagnosis Date Noted   Acute kidney injury ([place]) 05/20/2022   Chronic hypoxic respiratory failure, on home oxygen therapy ([place]) 05/20/2022   Congestive heart failure (CHF) ([place]) 05/20/2022   Microcytic anemia 05/20/2022   Chronic venous stasis dermatitis of both lower extremities 05/20/2022   Pressure ulcer of buttock 05/20/2022   Inflammatory arthritis 05/20/2022   Sinus bradycardia 05/20/2022   Dysuria 05/20/2022   PAF (paroxysmal atrial fibrillation) ([place]) 03/08/2022   Acute on chronic respiratory failure with hypoxia and hypercapnia (HCC) 03/05/2022   Effusion of knee joint, left 03/22/2021   Acute on chronic systolic CHF (congestive heart failure) ([place]) 03/19/2021   Acute on chronic heart failure ([place]) 03/14/2021   Acute hypoxemic respiratory failure ([place]) 03/14/2021   Diabetes mellitus without complication ([place]) 31/54/0086   HLD (hyperlipidemia) 04/09/2020   Depression 04/09/2020   Elevated troponin 04/09/2020   OSA (obstructive sleep apnea) 04/09/2020   Peripheral polyneuropathy 02/29/2020   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   Lumbar radiculopathy, acute    Acute midline low back pain without sciatica    Type 2 diabetes mellitus with  diabetic neuropathy, without long-term current use of insulin ([place]) 02/21/2020   Hypothyroidism 02/21/2020   Morbid obesity with BMI of 50.0-59.9, adult ([place]) 02/21/2020   CAP (community acquired pneumonia) 02/21/2020   Bilateral cellulitis of lower leg 02/21/2020   Acute respiratory failure with hypoxia and hypercapnia ([place]) 02/21/2020   Acute renal failure superimposed on stage 3a chronic kidney disease ([place]) 02/21/2020   Transaminitis 01/27/2020   Lymphedema of both lower extremities 08/21/2016   Benign essential hypertension 05/18/2016     Past Surgical History   Past Surgical History:  Procedure Laterality Date   ABDOMINAL HYSTERECTOMY     COLONOSCOPY WITH PROPOFOL N/A 08/26/2015   Procedure: COLONOSCOPY WITH PROPOFOL;  Surgeon: Manya Silvas, MD;  Location: [facility] ENDOSCOPY;  Service: Endoscopy;  Laterality: N/A;   THYROIDECTOMY       Home Medications   Prior to Admission medications   Medication Sig Start Date End Date Taking? Authorizing Provider  acetaminophen (TYLENOL) 650 MG CR tablet Take 1,300 mg by mouth every 8 (eight) hours as needed for pain.    [provider]  ALPRAZolam Duanne Moron) 0.25 MG tablet Take 1 tablet (0.25 mg total) by mouth 3 (three) times daily as needed for anxiety. 05/29/22   Sreenath, Trula Slade, MD  apixaban (ELIQUIS) 5 MG TABS tablet Take 1 tablet (5 mg total) by mouth 2 (two) times daily. 07/25/21   Dessa Phi, DO  atorvastatin (LIPITOR) 20 MG tablet Take 20 mg by mouth daily. 01/31/20   [provider]  buPROPion (  WELLBUTRIN XL) 150 MG 24 hr tablet Take 150 mg by mouth daily. 01/10/21   [provider]  Catheter Self-Adhesive Urinary MISC Continue PurWIck prn if available 03/18/22   Emeterio Reeve, DO  Cholecalciferol 25 MCG (1000 UT) tablet Take 1 tablet by mouth daily.    [provider]  docusate sodium (COLACE) 100 MG capsule Take 1 capsule (100 mg total) by mouth 2 (two) times daily as needed for mild  constipation. 03/18/22   Emeterio Reeve, DO  furosemide (LASIX) 40 MG tablet Take 1 tablet (40 mg total) by mouth 2 (two) times daily. Increase to 1 tablet (40 mg total) by mouth THREE TIMES daily (total daily dose 120 mg) as needed for up to 3 days for increased leg swelling, shortness of breath, weight gain 5+ lbs over 1-2 days. Seek medical care if these symptoms are not improving with increased dose. Reduce dose to 1 tablet (40 mg total) by mouth ONCE DAILY if dizziness or low blood pressure on bid dose. 03/18/22   Emeterio Reeve, DO  gabapentin (NEURONTIN) 100 MG capsule Take 2 capsules (200 mg total) by mouth 3 (three) times daily. 05/29/22   Sidney Ace, MD  Iron, Ferrous Sulfate, 325 (65 Fe) MG TABS Take 325 mg by mouth daily at 12 noon. 05/29/22   Sidney Ace, MD  levothyroxine (SYNTHROID) 125 MCG tablet Take 1 tablet (125 mcg total) by mouth daily. Patient taking differently: Take 250 mcg by mouth daily. 07/26/21   Dessa Phi, DO  metolazone (ZAROXOLYN) 2.5 MG tablet Take 2.5 mg by mouth daily as needed (if weight up > 5 pounds).    [provider]  Mouthwashes (MOUTH RINSE) LIQD solution 15 mLs by Mouth Rinse route as needed (oral care). 03/18/22   Emeterio Reeve, DO  Multiple Vitamin (MULTIVITAMIN) tablet Take 1 tablet by mouth daily.    [provider]  pantoprazole (PROTONIX) 40 MG tablet Take 40 mg by mouth daily. 01/11/21   [provider]  polyethylene glycol (MIRALAX / GLYCOLAX) 17 g packet Take 17 g by mouth daily as needed for moderate constipation. 03/18/22   Emeterio Reeve, DO  potassium chloride SA (KLOR-CON M) 20 MEQ tablet Take 20 mEq by mouth daily. 12/15/21   [provider]  propranolol (INDERAL) 20 MG tablet Take 1 tablet (20 mg total) by mouth 2 (two) times daily. 05/29/22   Sidney Ace, MD  rOPINIRole (REQUIP) 3 MG tablet Take 1 tablet (3 mg total) by mouth at bedtime. 03/18/22   Emeterio Reeve, DO   Semaglutide, 1 MG/DOSE, 2 MG/1.5ML SOPN Inject 0.75 mLs into the skin once a week. 10/14/20   [provider]  zinc gluconate 50 MG tablet Take 50 mg by mouth daily.    [provider]     Allergies  Patient has no known allergies.   Family History   Family History  Problem Relation Age of Onset   Breast cancer Neg Hx      Physical Exam  Triage Vital Signs: ED Triage Vitals  Enc Vitals Group     BP 07/08/22 1911 (!) 142/111     Pulse Rate 07/08/22 1911 (!) 127     Resp 07/08/22 1911 (!) 22     Temp 07/08/22 1911 99.1 F (37.3 C)     Temp Source 07/08/22 2058 Oral     SpO2 07/08/22 1911 95 %     Weight 07/08/22 1912 297 lb (134.7 kg)     Height  07/08/22 1912 '5\' 4"'$  (1.626 m)     Head Circumference --      Peak Flow --      Pain Score 07/08/22 1912 0     Pain Loc --      Pain Edu? --      Excl. in [place]? --     Updated Vital Signs: BP (!) 120/109 (BP Location: Left Arm)   Pulse (!) 122   Temp 98 F (36.7 C) (Oral)   Resp 18   Ht '5\' 4"'$  (1.626 m)   Wt 134.7 kg   SpO2 95%   BMI 50.98 kg/m    General: Awake, mild distress.  CV:  Tachycardic. Good peripheral perfusion.  Resp:  Normal effort. Diminished o/w CTAB. Abd:  Obese, nontender. No distention.  Other:  No pedal edema.   ED Results / Procedures / Treatments  Labs (all labs ordered are listed, but only abnormal results are displayed) Labs Reviewed  COMPREHENSIVE METABOLIC PANEL - Abnormal; Notable for the following components:      Result Value   Chloride 97 (*)    CO2 33 (*)    BUN 47 (*)    Creatinine, Ser 1.39 (*)    Calcium 8.6 (*)    Albumin 3.0 (*)    Total Bilirubin 1.6 (*)    GFR, Estimated 41 (*)    All other components within normal limits  CBC WITH DIFFERENTIAL/PLATELET - Abnormal; Notable for the following components:   WBC 12.6 (*)    Hemoglobin 11.1 (*)    MCH 24.9 (*)    MCHC 28.2 (*)    RDW 23.4 (*)    Neutro Abs 10.3 (*)    All other components within normal  limits  URINALYSIS, COMPLETE (UACMP) WITH MICROSCOPIC - Abnormal; Notable for the following components:   Color, Urine AMBER (*)    APPearance CLOUDY (*)    Hgb urine dipstick MODERATE (*)    Leukocytes,Ua LARGE (*)    WBC, UA >50 (*)    Bacteria, UA MANY (*)    Squamous Epithelial / LPF >50 (*)    Non Squamous Epithelial PRESENT (*)    All other components within normal limits  PROTIME-INR - Abnormal; Notable for the following components:   Prothrombin Time 24.7 (*)    INR 2.3 (*)    All other components within normal limits  APTT - Abnormal; Notable for the following components:   aPTT 44 (*)    All other components within normal limits  TROPONIN I (HIGH SENSITIVITY) - Abnormal; Notable for the following components:   Troponin I (High Sensitivity) 38 (*)    All other components within normal limits  TROPONIN I (HIGH SENSITIVITY) - Abnormal; Notable for the following components:   Troponin I (High Sensitivity) 36 (*)    All other components within normal limits  RESP PANEL BY RT-PCR (RSV, FLU A&B, COVID)  RVPGX2  CULTURE, BLOOD (ROUTINE X 2)  CULTURE, BLOOD (ROUTINE X 2)  URINE CULTURE  LACTIC ACID, PLASMA     EKG  ED ECG REPORT I, Simcha Farrington J, the attending physician, personally viewed and interpreted this ECG.   Date: 06/19/2022  EKG Time: 1913  Rate: 126  Rhythm: sinus tachycardia  Axis: LAD  Intervals:none  ST&T Change: Nonspecific    RADIOLOGY I have independently visualized and interpreted patient's xray as well as noted the radiology interpretation:  Cxr: pulm venous congestion  Official radiology report(s): DG Chest Port 1 View  Result Date: 07/08/2022  CLINICAL DATA:  Tachycardia, possible sepsis EXAM: PORTABLE CHEST 1 VIEW COMPARISON:  05/27/2022 FINDINGS: Mild to moderate enlargement of the cardiopericardial silhouette. Mildly improved retrocardiac aeration compared to previous. There is likely some linear subsegmental atelectasis in both lower lobes.  Mild indistinctness of the pulmonary vasculature suggesting pulmonary venous hypertension. No compelling findings of pneumonia. Thoracic spondylosis noted. IMPRESSION: 1. Mild to moderate enlargement of the cardiopericardial silhouette with pulmonary venous hypertension but no overt edema. 2. Mild subsegmental atelectasis in both lower lobes. 3. Thoracic spondylosis. 4. Mildly improved retrocardiac aeration compared to previous. Electronically Signed   By: Van Clines M.D.   On: 07/08/2022 20:12     PROCEDURES:  Critical Care performed: No  .1-3 Lead EKG Interpretation  Performed by: Paulette Blanch, MD Authorized by: Paulette Blanch, MD     Interpretation: abnormal     ECG rate:  122   ECG rate assessment: tachycardic     Rhythm: sinus tachycardia     Ectopy: none     Conduction: normal   Comments:     Patient placed on cardiac monitor to evaluate for arrthymias    MEDICATIONS ORDERED IN ED: Medications  lactated ringers infusion (has no administration in time range)  cefTRIAXone (ROCEPHIN) 2 g in sodium chloride 0.9 % 100 mL IVPB (2 g Intravenous New Bag/Given 07/08/22 2215)  lactated ringers bolus 1,000 mL (1,000 mLs Intravenous New Bag/Given 07/08/22 2212)    And  lactated ringers bolus 800 mL (800 mLs Intravenous New Bag/Given 07/08/22 2213)     IMPRESSION / MDM / [place] / ED COURSE  I reviewed the triage vital signs and the nursing notes.                             66 year old female presents with tachycardia. Differential diagnosis includes but is not limited to ACS, infectious, metabolic etiologies, etc. I have personally reviewed patient's records and note an orthopedic office visit on 07/03/2022 for osteoarthritis. Patient states she has been on 3 rounds of abx for UTI. Urine culture from November showed Klebsiella. Will send cx from tonight's visit.  Patient's presentation is most consistent with acute presentation with potential threat to life or  bodily function.  The patient is on the cardiac monitor to evaluate for evidence of arrhythmia and/or significant heart rate changes.  Laboratory results demonstrate mild leukocytosis WBC 12.6, AKI BUN 47/Cr 1.39, mildly elevated troponin likely demand ischemia from tachycardia which is secondary to SIRS. Lactate and cxr negative, resp panel negative. INR 2.3; UA with large LE WBC >50. Patient received IVF and Rocephine while awaiting treatment room. Will consult hospitalist services for evaluation and admission.      FINAL CLINICAL IMPRESSION(S) / ED DIAGNOSES   Final diagnoses:  SIRS (systemic inflammatory response syndrome) (HCC)  Tachycardia  AKI (acute kidney injury) ([place])  Urinary tract infection without hematuria, site unspecified     Rx / DC Orders   ED Discharge Orders     None        Note:  This document was prepared using Dragon voice recognition software and may include unintentional dictation errors.   Paulette Blanch, MD 07/01/2022 [phone]

## 2022-07-10 ENCOUNTER — Encounter: Payer: Self-pay | Admitting: Family Medicine

## 2022-07-10 ENCOUNTER — Other Ambulatory Visit: Payer: Self-pay

## 2022-07-10 DIAGNOSIS — N1831 Chronic kidney disease, stage 3a: Secondary | ICD-10-CM | POA: Diagnosis not present

## 2022-07-10 DIAGNOSIS — F418 Other specified anxiety disorders: Secondary | ICD-10-CM

## 2022-07-10 DIAGNOSIS — I5A Non-ischemic myocardial injury (non-traumatic): Secondary | ICD-10-CM

## 2022-07-10 DIAGNOSIS — E1122 Type 2 diabetes mellitus with diabetic chronic kidney disease: Secondary | ICD-10-CM

## 2022-07-10 DIAGNOSIS — A419 Sepsis, unspecified organism: Secondary | ICD-10-CM | POA: Diagnosis not present

## 2022-07-10 DIAGNOSIS — I5022 Chronic systolic (congestive) heart failure: Secondary | ICD-10-CM

## 2022-07-10 DIAGNOSIS — I4891 Unspecified atrial fibrillation: Secondary | ICD-10-CM

## 2022-07-10 DIAGNOSIS — N3000 Acute cystitis without hematuria: Secondary | ICD-10-CM | POA: Diagnosis not present

## 2022-07-10 LAB — PROTIME-INR
INR: 2.1 — ABNORMAL HIGH (ref 0.8–1.2)
Prothrombin Time: 23.1 seconds — ABNORMAL HIGH (ref 11.4–15.2)

## 2022-07-10 LAB — LIPID PANEL
Cholesterol: 125 mg/dL (ref 0–200)
HDL: 22 mg/dL — ABNORMAL LOW (ref >40)
LDL Cholesterol: 81 mg/dL (ref 0–99)
Total CHOL/HDL Ratio: 5.7 RATIO
Triglycerides: 111 mg/dL (ref <150)
VLDL: 22 mg/dL (ref 0–40)

## 2022-07-10 LAB — BASIC METABOLIC PANEL
Anion gap: 13 (ref 5–15)
BUN: 48 mg/dL — ABNORMAL HIGH (ref 8–23)
CO2: 29 mmol/L (ref 22–32)
Calcium: 8.9 mg/dL (ref 8.9–10.3)
Chloride: 96 mmol/L — ABNORMAL LOW (ref 98–111)
Creatinine, Ser: 1.2 mg/dL — ABNORMAL HIGH (ref 0.44–1.00)
GFR, Estimated: 49 mL/min — ABNORMAL LOW (ref >60)
Glucose, Bld: 95 mg/dL (ref 70–99)
Potassium: 3.6 mmol/L (ref 3.5–5.1)
Sodium: 138 mmol/L (ref 135–145)

## 2022-07-10 LAB — CBC
HCT: 35.6 % — ABNORMAL LOW (ref 36.0–46.0)
Hemoglobin: 10.2 g/dL — ABNORMAL LOW (ref 12.0–15.0)
MCH: 25.1 pg — ABNORMAL LOW (ref 26.0–34.0)
MCHC: 28.7 g/dL — ABNORMAL LOW (ref 30.0–36.0)
MCV: 87.5 fL (ref 80.0–100.0)
Platelets: 286 10*3/uL (ref 150–400)
RBC: 4.07 MIL/uL (ref 3.87–5.11)
RDW: 22.8 % — ABNORMAL HIGH (ref 11.5–15.5)
WBC: 11.1 10*3/uL — ABNORMAL HIGH (ref 4.0–10.5)
nRBC: 0.3 % — ABNORMAL HIGH (ref 0.0–0.2)

## 2022-07-10 LAB — HEMOGLOBIN A1C
Hgb A1c MFr Bld: 5.3 % (ref 4.8–5.6)
Mean Plasma Glucose: 105 mg/dL

## 2022-07-10 LAB — CBG MONITORING, ED: Glucose-Capillary: 75 mg/dL (ref 70–99)

## 2022-07-10 LAB — PROCALCITONIN: Procalcitonin: 0.31 ng/mL

## 2022-07-10 LAB — BRAIN NATRIURETIC PEPTIDE: B Natriuretic Peptide: 1290 pg/mL — ABNORMAL HIGH (ref 0.0–100.0)

## 2022-07-10 LAB — TSH: TSH: 11.874 u[IU]/mL — ABNORMAL HIGH (ref 0.350–4.500)

## 2022-07-10 MED ORDER — METOPROLOL TARTRATE 25 MG PO TABS
25.0000 mg | ORAL_TABLET | Freq: Two times a day (BID) | ORAL | Status: DC
  Administered 2022-07-10 (×2): 25 mg via ORAL
  Filled 2022-07-10 (×2): qty 1

## 2022-07-10 NOTE — ED Notes (Signed)
Pt heavy two assist to pivot to bedside commode. Refused bedpan.

## 2022-07-10 NOTE — Evaluation (Signed)
Occupational Therapy Evaluation Patient Details Name: Madison Mcbride DOB: Feb 13, 1956 Today's Date: 07/10/2022   History of Present Illness Madison Mcbride is a 66 y.o. female  brought to the ED via EMS from SNF for tachycardia x 10-12 hours. Patient states she has been on 3 rounds of abx for UTI. Currently treated for UTI. Ot is being treated for possible  A-FIb as per Nurse.   Clinical Impression   Patient received for OT evaluation. See flowsheet below for details of function. Generally, patient requiring MAX A for bed mobility, use of STEDY with MIN A for sit to stand, and Setup-MAX A for ADLs. Pt admitted from SNF where she was doing rehab to work on transfers and ADLs; states she was working on standing while at rehab. Patient will benefit from continued OT while in acute care.       Recommendations for follow up therapy are one component of a multi-disciplinary discharge planning process, led by the attending physician.  Recommendations may be updated based on patient status, additional functional criteria and insurance authorization.   Follow Up Recommendations  Skilled nursing-short term rehab (<3 hours/day)     Assistance Recommended at Discharge Frequent or constant Supervision/Assistance  Patient can return home with the following Two people to help with walking and/or transfers;A lot of help with bathing/dressing/bathroom;Assistance with cooking/housework;Direct supervision/assist for medications management;Direct supervision/assist for financial management;Assist for transportation;Help with stairs or ramp for entrance    Functional Status Assessment  Patient has had a recent decline in their functional status and demonstrates the ability to make significant improvements in function in a reasonable and predictable amount of time.  Equipment Recommendations  Other (comment) (defer to next venue of care)    Recommendations for Other Services        Precautions / Restrictions Precautions Precautions: Fall Restrictions Weight Bearing Restrictions: No      Mobility Bed Mobility Overal bed mobility: Needs Assistance Bed Mobility: Supine to Sit, Sit to Supine     Supine to sit: Max assist Sit to supine: Max assist   General bed mobility comments: Pt sat on EOB x15 minutes, supervision; Good sitting balance.    Transfers Overall transfer level: Needs assistance Equipment used:  (STEDY sit to stand lift utilized) Transfers: Sit to/from Stand Sit to Stand: Min assist (with STEDY)           General transfer comment: Pt required use of STEDY for x1 MIN A sit to stand today; body habitus limiting ability to fit hips inside STEDY.      Balance Overall balance assessment: Needs assistance Sitting-balance support: Bilateral upper extremity supported Sitting balance-Leahy Scale: Good     Standing balance support: Bilateral upper extremity supported, Reliant on assistive device for balance Standing balance-Leahy Scale: Fair Standing balance comment:  (Use of STEDY for safety given pt report of R knee buckling.)                           ADL either performed or assessed with clinical judgement   ADL Overall ADL's : Needs assistance/impaired Eating/Feeding: Set up;Sitting   Grooming: Set up;Sitting (anticipated)   Upper Body Bathing: Minimal assistance;Sitting (anticipated)   Lower Body Bathing: Maximal assistance;Sitting/lateral leans (anticipated)   Upper Body Dressing : Set up;Sitting (anticipated)   Lower Body Dressing: Maximal assistance;Total assistance;Sit to/from stand (anticipated; pt required total assist for donning socks today)   Toilet Transfer: +2 for physical assistance (per nursing,  pt requiring two person assist for t/f to [facility] earlier today)   Toileting- Clothing Manipulation and Hygiene: Total assistance;Sit to/from stand (anticipated)   Tub/ Banker:  (not currently safe;  recommend sponge bathing)   Functional mobility during ADLs: +2 for physical assistance;Rolling walker (2 wheels) (per nursing from toilet t/f earlier today)       Vision Baseline Vision/History: 1 Wears glasses       Perception     Praxis      Pertinent Vitals/Pain Pain Assessment Pain Assessment: 0-10 Pain Score:  (unrated) Pain Location: knees (OA) Pain Descriptors / Indicators: Discomfort Pain Intervention(s): Limited activity within patient's tolerance     Hand Dominance Right   Extremity/Trunk Assessment Upper Extremity Assessment Upper Extremity Assessment: Overall WFL for tasks assessed   Lower Extremity Assessment Lower Extremity Assessment: Generalized weakness (OA in knees; R in particular)       Communication Communication Communication: No difficulties   Cognition Arousal/Alertness: Awake/alert Behavior During Therapy: WFL for tasks assessed/performed Overall Cognitive Status: Within Functional Limits for tasks assessed                                 General Comments: Pleasant and cooperative.     General Comments       Exercises     Shoulder Instructions      Home Living Family/patient expects to be discharged to:: Private residence (come from SNF this admission) Living Arrangements: Spouse/significant other (spouse has injured arm; can't assist much with transfers) Available Help at Discharge: Family;Personal care attendant Type of Home: House Home Access: Stairs to enter [employer] of Steps: 5 Entrance Stairs-Rails: Right;Left;Can reach both Home Layout: One level     Bathroom Shower/Tub: Tub/shower unit;Walk-in shower   Bathroom Toilet: Standard Bathroom Accessibility: Yes   Home Equipment: Shower seat;Wheelchair - manual;BSC/3in1;Rollator (4 wheels);Grab bars - tub/shower   Additional Comments: PRN O2 3L      Prior Functioning/Environment Prior Level of Function : Other (comment) (was MOD (I) prior  to SNF admission (Since Nov 18); at SNF, pt states she was working on standing)             Mobility Comments: At baseline ambulates with rollator at home. ADLs Comments: Per previous OT eval before SNF admission: "pt reports MOD I in ADL, uses a reacher. does not normally wear socks, just slip on shoes. She has a walk in bathtub. " The personal care attendent helps pt with cookijng and cleaning. Unable to lift or clean the pt.        OT Problem List: Decreased strength;Decreased activity tolerance;Impaired balance (sitting and/or standing);Decreased knowledge of use of DME or AE      OT Treatment/Interventions: Self-care/ADL training;Therapeutic exercise;DME and/or AE instruction;Therapeutic activities;Patient/family education    OT Goals(Current goals can be found in the care plan section) Acute Rehab OT Goals Patient Stated Goal: Get back to rehab so I can get to walking again OT Goal Formulation: With patient/family Time For Goal Achievement: 07/24/22 Potential to Achieve Goals: Good ADL Goals Pt Will Transfer to Toilet: with modified independence;bedside commode Pt Will Perform Toileting - Clothing Manipulation and hygiene: with modified independence;sit to/from stand  OT Frequency: Min 2X/week    Co-evaluation              AM-PAC OT "6 Clicks" Daily Activity     Outcome Measure Help from another person eating meals?: None Help from another  person taking care of personal grooming?: None Help from another person toileting, which includes using toliet, bedpan, or urinal?: Total Help from another person bathing (including washing, rinsing, drying)?: A Lot Help from another person to put on and taking off regular upper body clothing?: A Little Help from another person to put on and taking off regular lower body clothing?: Total 6 Click Score: 15   End of Session Equipment Utilized During Treatment: Other (comment) (STEDY non-mechanical sit to stand lift) Nurse  Communication: Mobility status  Activity Tolerance: Patient limited by fatigue Patient left: in bed;with family/visitor present  OT Visit Diagnosis: Unsteadiness on feet (R26.81);Muscle weakness (generalized) (M62.81)                Time: 8921-1941 OT Time Calculation (min): 36 min Charges:  OT General Charges $OT Visit: 1 Visit OT Evaluation $OT Eval Moderate Complexity: 1 Mod OT Treatments $Therapeutic Activity: 8-22 mins  Madison Amato, MS, OTR/L   Vania Rea 07/10/2022, 3:06 PM

## 2022-07-10 NOTE — Progress Notes (Signed)
PROGRESS NOTE  Madison Mcbride POE:[id number] DOB: [birth year]   PCP: Rusty Aus, MD  Patient is from: SNF  DOA: 07/05/2022 LOS: 1  Chief complaints Chief Complaint  Patient presents with   Tachycardia     Brief Narrative / Interim history: 66 year old F with PMH of A-fib on Eliquis, diastolic CHF, DM-2, HTN, HLD, hypothyroidism, thyroid cancer, anxiety, depression, morbid obesity, chronic back pain, OSA on CPAP and CKD-3A sent to ED with tachycardia with HR in 130 send admitted for sepsis due to urinary tract infection. In ED, she had mild leukocytosis to 12.6.  UA concerning for UTI.  COVID-19, influenza and RSV PCR nonreactive.  CXR with cardiomegaly without infiltration or edema.  Cultures obtained.  Patient was started on IV meropenem due to history of ESBL, admitted for further care.  Subjective: Seen and examined earlier this morning.  No major events overnight of this morning.  Patient reports 2 days of mild dysuria and pressure when she urinates.  She denies chest pain, shortness of breath, nausea, vomiting or abdominal pain.  She denies fever or chills.  She denies new back pain.  She states she was treated for UTI 3 times while at SNF.  Objective: Vitals:   07/10/22 1200 07/10/22 1230 07/10/22 1300 07/10/22 1329  BP: 117/71 119/77 115/70 115/70  Pulse: 90 85 79 86  Resp: (!) 22 20 (!) 24 20  Temp:    (!) 97.4 F (36.3 C)  TempSrc:    Oral  SpO2: 95% 95% 91% 96%  Weight:      Height:        Examination:  GENERAL: No apparent distress.  Nontoxic. HEENT: MMM.  Vision and hearing grossly intact.  NECK: Supple.  No apparent JVD.  RESP:  No IWOB.  Fair aeration bilaterally. CVS:  RRR. Heart sounds normal.  ABD/GI/GU: BS+. Abd soft, NTND.  MSK/EXT:  Moves extremities. No apparent deformity. No edema.  SKIN: BLE skin changes from chronic venous insufficiency. NEURO: Awake, alert and oriented appropriately.  No apparent focal neuro deficit. PSYCH: Calm.  Normal affect.   Procedures:  None  Microbiology summarized: [id number], influenza and RSV PCR nonreactive Blood cultures NGTD Culture pending  Assessment and plan: Principal Problem:   UTI (urinary tract infection) Active Problems:   Sepsis ([place])   Myocardial injury   Atrial fibrillation with RVR (HCC)   Chronic systolic CHF (congestive heart failure) (HCC)   HTN (hypertension)   Type II diabetes mellitus with renal manifestations (HCC)   HLD (hyperlipidemia)   Iron deficiency anemia   Chronic kidney disease, stage 3a ([place])   Hypothyroidism   Depression with anxiety   Morbid obesity with BMI of 50.0-59.9, adult ([place])  Sepsis due to urinary tract infection: POA.  Had tachycardia, tachypnea, leukocytosis with UTI.  Lactic acid within normal.  She reports some dysuria and pressure sensation with urination.  No fever.  Pro-Cal elevated to 0.31.  Blood cultures NGTD.  History of ESBL. -Continue IV meropenem pending urine culture    Elevated troponin/demand ischemia/type II MI: Troponin level 38 --> 36 likely demand ischemia from the above and delayed clearance from CKD. -Continue home medications.   Chronic systolic CHF: TTE on 6/76/1950 with LVEF of 45 to 50% and mild to moderate MVR, TVR and moderate AVR.  Appears euvolemic on exam.  BNP elevated to 1300, slightly higher than baseline. On p.o. Lasix and metolazone at home.  Creatinine slightly higher than baseline. -Hold p.o. Lasix and metolazone -Closely monitor fluid  and respiratory status -Hold Lasix due to sepsis and worsening renal function     Atrial fibrillation with RVR: Seems to be in sinus rhythm with intermittent tachycardia.  Started on Cardizem drip in ED.  Seems she is on Inderal and Eliquis at home. -Transition to p.o. metoprolol 25 mg twice daily -Continue Eliquis for anticoagulation. -Hold home Inderal.   Essential HTN: Normotensive. -Meds as above.   Controlled NIDDM-2 with HLD and CKD-3A: A1c 5.3%.  CBG  in 80s consistently. -Discontinue SSI and CBG monitoring -Lipitor   Iron deficiency anemia: H&H stable. Recent Labs    05/22/22 0317 05/23/22 0627 05/24/22 0549 05/25/22 0437 05/26/22 0619 05/27/22 0526 05/28/22 0444 05/29/22 0528 07/08/22 1924 07/10/22 0443  HGB 8.5* 9.0* 9.4* 9.7* 9.3* 9.3* 9.0* 9.2* 11.1* 10.2*  -Monitor as needed  CKD-3A: stable. Recent Labs    05/22/22 0317 05/23/22 0627 05/24/22 0549 05/25/22 0437 05/26/22 0619 05/27/22 0526 05/28/22 0444 05/29/22 0528 07/08/22 1924 07/10/22 0443  BUN 41* 34* 31* 33* 33* 32* 33* 29* 47* 48*  CREATININE 1.51* 1.21* 1.10* 1.36* 1.28* 1.19* 1.17* 1.14* 1.39* 1.20*  -Hold Lasix and metolazone. -Continue monitoring  Hypothyroidism -Check TSH -Synthroid   Depression with anxiety: Stable -Continue home medications  Generalized weakness/physical deconditioning: Patient is from SNF. -PT/OT eval.   Morbid obesity with BMI of 50.0-59.9, adult (HCC) Body mass index is 50.98 kg/m.          DVT prophylaxis:   apixaban (ELIQUIS) tablet 5 mg  Code Status: Full code Family Communication: None at bedside Level of care: Progressive Status is: Inpatient Remains inpatient appropriate because: Sepsis due to urinary tract infection and sinus tachycardia   Final disposition: TBD Consultants:  None  Sch Meds:  Scheduled Meds:  apixaban  5 mg Oral BID   atorvastatin  20 mg Oral Daily   buPROPion  150 mg Oral Daily   cholecalciferol  1,000 Units Oral Daily   ferrous sulfate  325 mg Oral Q1200   gabapentin  200 mg Oral TID   levothyroxine  250 mcg Oral Q0600   multivitamin with minerals  1 tablet Oral Daily   pantoprazole  40 mg Oral Daily   propranolol  20 mg Oral BID   rOPINIRole  3 mg Oral QHS   zinc sulfate  220 mg Oral Daily   Continuous Infusions:  diltiazem (CARDIZEM) infusion 7.5 mg/hr (07/10/22 1330)   meropenem (MERREM) IV Stopped (07/10/22 0623)   PRN Meds:.acetaminophen **OR**  acetaminophen, ALPRAZolam, cyclobenzaprine, docusate sodium, hydrALAZINE, magnesium hydroxide, metoprolol tartrate, ondansetron **OR** ondansetron (ZOFRAN) IV, mouth rinse, polyethylene glycol, traZODone  Antimicrobials: Anti-infectives (From admission, onward)    Start     Dose/Rate Route Frequency Ordered Stop   07/03/2022 0815  meropenem (MERREM) 1 g in sodium chloride 0.9 % 100 mL IVPB        1 g 200 mL/hr over 30 Minutes Intravenous Every 8 hours 06/25/2022 0802     07/05/2022 0615  cefTRIAXone (ROCEPHIN) 2 g in sodium chloride 0.9 % 100 mL IVPB  Status:  Discontinued        2 g 200 mL/hr over 30 Minutes Intravenous Every 24 hours 06/21/2022 0610 06/24/2022 0613   07/08/22 1945  cefTRIAXone (ROCEPHIN) 2 g in sodium chloride 0.9 % 100 mL IVPB  Status:  Discontinued        2 g 200 mL/hr over 30 Minutes Intravenous Every 24 hours 07/08/22 1938 07/05/2022 0750        I have personally reviewed  the following labs and images: CBC: Recent Labs  Lab 07/08/22 1924 07/10/22 0443  WBC 12.6* 11.1*  NEUTROABS 10.3*  --   HGB 11.1* 10.2*  HCT 39.4 35.6*  MCV 88.3 87.5  PLT 313 286   BMP &GFR Recent Labs  Lab 07/08/22 1924 07/10/22 0443  NA 141 138  K 4.2 3.6  CL 97* 96*  CO2 33* 29  GLUCOSE 96 95  BUN 47* 48*  CREATININE 1.39* 1.20*  CALCIUM 8.6* 8.9   Estimated Creatinine Clearance: 61.4 mL/min (A) (by C-G formula based on SCr of 1.2 mg/dL (H)). Liver & Pancreas: Recent Labs  Lab 07/08/22 1924  AST 28  ALT 14  ALKPHOS 110  BILITOT 1.6*  PROT 7.4  ALBUMIN 3.0*   No results for input(s): "LIPASE", "AMYLASE" in the last 168 hours. No results for input(s): "AMMONIA" in the last 168 hours. Diabetic: Recent Labs    07/08/22 1924  HGBA1C 5.3   Recent Labs  Lab 06/24/2022 0832 07/02/2022 1146 06/16/2022 1646 07/10/2022 2123 07/10/22 0759  GLUCAP 75 84 84 86 75   Cardiac Enzymes: No results for input(s): "CKTOTAL", "CKMB", "CKMBINDEX", "TROPONINI" in the last 168 hours. No  results for input(s): "PROBNP" in the last 8760 hours. Coagulation Profile: Recent Labs  Lab 07/08/22 2250 07/10/22 0443  INR 2.3* 2.1*   Thyroid Function Tests: No results for input(s): "TSH", "T4TOTAL", "FREET4", "T3FREE", "THYROIDAB" in the last 72 hours. Lipid Profile: Recent Labs    07/10/22 0443  CHOL 125  HDL 22*  LDLCALC 81  TRIG 111  CHOLHDL 5.7   Anemia Panel: No results for input(s): "VITAMINB12", "FOLATE", "FERRITIN", "TIBC", "IRON", "RETICCTPCT" in the last 72 hours. Urine analysis:    Component Value Date/Time   COLORURINE AMBER (A) 07/08/2022 2201   APPEARANCEUR CLOUDY (A) 07/08/2022 2201   APPEARANCEUR CLOUDY 01/15/2014 0209   LABSPEC 1.015 07/08/2022 2201   LABSPEC 1.014 01/15/2014 0209   PHURINE 5.0 07/08/2022 2201   GLUCOSEU NEGATIVE 07/08/2022 2201   GLUCOSEU NEGATIVE 01/15/2014 0209   HGBUR MODERATE (A) 07/08/2022 2201   BILIRUBINUR NEGATIVE 07/08/2022 2201   BILIRUBINUR NEGATIVE 01/15/2014 0209   KETONESUR NEGATIVE 07/08/2022 2201   PROTEINUR NEGATIVE 07/08/2022 2201   NITRITE NEGATIVE 07/08/2022 2201   LEUKOCYTESUR LARGE (A) 07/08/2022 2201   LEUKOCYTESUR 2+ 01/15/2014 0209   Sepsis Labs: Invalid input(s): "PROCALCITONIN", "LACTICIDVEN"  Microbiology: Recent Results (from the past 240 hour(s))  Culture, blood (Routine x 2)     Status: None (Preliminary result)   Collection Time: 07/08/22  7:39 PM   Specimen: BLOOD  Result Value Ref Range Status   Specimen Description BLOOD BLOOD RIGHT ARM  Final   Special Requests   Final    BOTTLES DRAWN AEROBIC AND ANAEROBIC Blood Culture adequate volume   Culture   Final    NO GROWTH 2 DAYS Performed at [facility], [address], [place], [place] [address]    Report Status PENDING  Incomplete  Culture, blood (Routine x 2)     Status: None (Preliminary result)   Collection Time: 07/08/22  7:47 PM   Specimen: BLOOD  Result Value Ref Range Status   Specimen Description BLOOD BLOOD LEFT  ARM  Final   Special Requests   Final    BOTTLES DRAWN AEROBIC AND ANAEROBIC Blood Culture adequate volume   Culture   Final    NO GROWTH 2 DAYS Performed at [facility], [address], [place], [place] [address]    Report  Status PENDING  Incomplete  Resp panel by RT-PCR (RSV, Flu A&B, Covid) Anterior Nasal Swab     Status: None   Collection Time: 07/08/22  7:47 PM   Specimen: Anterior Nasal Swab  Result Value Ref Range Status   SARS Coronavirus 2 by RT PCR NEGATIVE NEGATIVE Final    Comment: (NOTE) SARS-CoV-2 target nucleic acids are NOT DETECTED.  The SARS-CoV-2 RNA is generally detectable in upper respiratory specimens during the acute phase of infection. The lowest concentration of SARS-CoV-2 viral copies this assay can detect is 138 copies/mL. A negative result does not preclude SARS-Cov-2 infection and should not be used as the sole basis for treatment or other patient management decisions. A negative result may occur with  improper specimen collection/handling, submission of specimen other than nasopharyngeal swab, presence of viral mutation(s) within the areas targeted by this assay, and inadequate number of viral copies(<138 copies/mL). A negative result must be combined with clinical observations, patient history, and epidemiological information. The expected result is Negative.  Fact Sheet for Patients:  EntrepreneurPulse.com.au  Fact Sheet for Healthcare Providers:  IncredibleEmployment.be  This test is no t yet approved or cleared by the [place] FDA and  has been authorized for detection and/or diagnosis of SARS-CoV-2 by FDA under an Emergency Use Authorization (EUA). This EUA will remain  in effect (meaning this test can be used) for the duration of the COVID-19 declaration under Section 564(b)(1) of the Act, 21 U.S.C.section 360bbb-3(b)(1), unless the authorization is terminated  or revoked sooner.        Influenza A by PCR NEGATIVE NEGATIVE Final   Influenza B by PCR NEGATIVE NEGATIVE Final    Comment: (NOTE) The Xpert Xpress SARS-CoV-2/FLU/RSV plus assay is intended as an aid in the diagnosis of influenza from Nasopharyngeal swab specimens and should not be used as a sole basis for treatment. Nasal washings and aspirates are unacceptable for Xpert Xpress SARS-CoV-2/FLU/RSV testing.  Fact Sheet for Patients: EntrepreneurPulse.com.au  Fact Sheet for Healthcare Providers: IncredibleEmployment.be  This test is not yet approved or cleared by the [place] FDA and has been authorized for detection and/or diagnosis of SARS-CoV-2 by FDA under an Emergency Use Authorization (EUA). This EUA will remain in effect (meaning this test can be used) for the duration of the COVID-19 declaration under Section 564(b)(1) of the Act, 21 U.S.C. section 360bbb-3(b)(1), unless the authorization is terminated or revoked.     Resp Syncytial Virus by PCR NEGATIVE NEGATIVE Final    Comment: (NOTE) Fact Sheet for Patients: EntrepreneurPulse.com.au  Fact Sheet for Healthcare Providers: IncredibleEmployment.be  This test is not yet approved or cleared by the [place] FDA and has been authorized for detection and/or diagnosis of SARS-CoV-2 by FDA under an Emergency Use Authorization (EUA). This EUA will remain in effect (meaning this test can be used) for the duration of the COVID-19 declaration under Section 564(b)(1) of the Act, 21 U.S.C. section 360bbb-3(b)(1), unless the authorization is terminated or revoked.  Performed at [facility], [address] [place], [place] [address]     Radiology Studies: No results found.    Joby Hershkowitz T. [place]  If 7PM-7AM, please contact night-coverage [email or web address] 07/10/2022, 1:54 PM

## 2022-07-10 NOTE — ED Notes (Signed)
Per MD Gonfa decrease Dilt rate to 5 mg/hr and give po metoprolol. Stop Dilt drip 1 hour after.

## 2022-07-10 NOTE — Progress Notes (Signed)
Pharmacy Antibiotic Note  Madison Mcbride is a 66 y.o. female admitted on 06/17/2022 with UTI. Hx of recurrent ESBL UTI. Most recently Klebsiella in November 2023. Pharmacy has been consulted for meropenem dosing.  Plan:  Continue meropenem 1 gram every 8 hours  Follow renal function, de-escalation, and length of therapy.  Height: '5\' 4"'$  (162.6 cm) Weight: 134.7 kg (297 lb) IBW/kg (Calculated) : 54.7  Temp (24hrs), Avg:98 F (36.7 C), Min:97.8 F (36.6 C), Max:98.2 F (36.8 C)  Recent Labs  Lab 07/08/22 1924 07/08/22 1939 07/10/22 0443  WBC 12.6*  --  11.1*  CREATININE 1.39*  --  1.20*  LATICACIDVEN  --  1.7  --      Estimated Creatinine Clearance: 61.4 mL/min (A) (by C-G formula based on SCr of 1.2 mg/dL (H)).    No Known Allergies  Antimicrobials this admission: ceftriaxone 12/27 x 1 Mreopenem 12/28>>   Dose adjustments this admission:  N/a  Microbiology results: 12/27 BCx: ngtd 12/27 UCx: to be collected   Thank you for allowing pharmacy to be a part of this patient's care.  Tollie Eth, PharmD Clinical Pharmacist  07/10/2022 8:37 AM

## 2022-07-10 NOTE — NC FL2 (Signed)
[place] LEVEL OF CARE FORM     IDENTIFICATION  Patient Name: Madison Mcbride Birthdate: 07-03-1956 Sex: female Admission Date (Current Location): 06/21/2022  [facility] and [place] Number:  Engineering geologist and Address:  [facility], [address] [place], [place] [address]      Provider Number: [id number]  Attending Physician Name and Address:  Mercy Riding, MD  Relative Name and Phone Number:  Quincey Nored- husband- [phone]    Current Level of Care: Hospital Recommended Level of Care: [place] Prior Approval Number:    Date Approved/Denied:   PASRR Number: [id number]  Discharge Plan: SNF    Current Diagnoses: Patient Active Problem List   Diagnosis Date Noted   UTI (urinary tract infection) 06/19/2022   HTN (hypertension) 07/06/2022   Type II diabetes mellitus with renal manifestations ([place]) 07/07/2022   Depression with anxiety 06/26/2022   Iron deficiency anemia 07/03/2022   Sepsis ([place]) 06/29/2022   Myocardial injury 07/11/2022   Atrial fibrillation with RVR ([place]) 06/21/2022   Acute kidney injury ([place]) 05/20/2022   Chronic hypoxic respiratory failure, on home oxygen therapy ([place]) 05/20/2022   Congestive heart failure (CHF) ([place]) 05/20/2022   Microcytic anemia 05/20/2022   Chronic venous stasis dermatitis of both lower extremities 05/20/2022   Pressure ulcer of buttock 05/20/2022   Inflammatory arthritis 05/20/2022   Sinus bradycardia 05/20/2022   Dysuria 05/20/2022   PAF (paroxysmal atrial fibrillation) ([place]) 03/08/2022   Acute on chronic respiratory failure with hypoxia and hypercapnia ([place]) 03/05/2022   Effusion of knee joint, left 02/72/5366   Chronic systolic CHF (congestive heart failure) ([place]) 03/19/2021   Acute on chronic heart failure ([place]) 03/14/2021   Acute hypoxemic respiratory failure ([place]) 03/14/2021   Diabetes mellitus without complication ([place]) 44/09/4740   HLD  (hyperlipidemia) 04/09/2020   Depression 04/09/2020   Elevated troponin 04/09/2020   OSA (obstructive sleep apnea) 04/09/2020   Peripheral polyneuropathy 02/29/2020   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   Lumbar radiculopathy, acute    Acute midline low back pain without sciatica    Type 2 diabetes mellitus with diabetic neuropathy, without long-term current use of insulin ([place]) 02/21/2020   Hypothyroidism 02/21/2020   Morbid obesity with BMI of 50.0-59.9, adult ([place]) 02/21/2020   CAP (community acquired pneumonia) 02/21/2020   Bilateral cellulitis of lower leg 02/21/2020   Acute respiratory failure with hypoxia and hypercapnia ([employer]) 02/21/2020   Acute renal failure superimposed on stage 3a chronic kidney disease ([place]) 02/21/2020   Chronic kidney disease, stage 3a ([place]) 01/27/2020   Transaminitis 01/27/2020   Lymphedema of both lower extremities 08/21/2016   Benign essential hypertension 05/18/2016    Orientation RESPIRATION BLADDER Height & Weight     Self, Time, Situation, Place  O2 ([place] 2L) Continent Weight: 134.7 kg Height:  '5\' 4"'$  (162.6 cm)  BEHAVIORAL SYMPTOMS/MOOD NEUROLOGICAL BOWEL NUTRITION STATUS      Continent Diet (see discharge summary)  AMBULATORY STATUS COMMUNICATION OF NEEDS Skin   Extensive Assist Verbally Normal                       Personal Care Assistance Level of Assistance  Bathing, Feeding, Dressing Bathing Assistance: Limited assistance Feeding assistance: Independent Dressing Assistance: Limited assistance     Functional Limitations Info  Sight, Hearing, Speech Sight Info: Adequate Hearing Info: Adequate Speech Info: Adequate    SPECIAL CARE FACTORS FREQUENCY  PT (By licensed PT), OT (By licensed  OT)     PT Frequency: min 4 times per week OT Frequency: min 2 times per week            Contractures Contractures Info: Not present    Additional Factors Info  Code Status, Allergies, Isolation  Precautions Code Status Info: Full Allergies Info: NKA     Isolation Precautions Info: Contact- ESBL     Current Medications (07/10/2022):  This is the current hospital active medication list Current Facility-Administered Medications  Medication Dose Route Frequency Provider Last Rate Last Admin   acetaminophen (TYLENOL) tablet 650 mg  650 mg Oral Q6H PRN Mansy, Jan A, MD       Or   acetaminophen (TYLENOL) suppository 650 mg  650 mg Rectal Q6H PRN Mansy, Arvella Merles, MD       ALPRAZolam Duanne Moron) tablet 0.25 mg  0.25 mg Oral TID PRN Mansy, Jan A, MD       apixaban Arne Cleveland) tablet 5 mg  5 mg Oral BID Mansy, Jan A, MD   5 mg at 07/04/2022 2110   atorvastatin (LIPITOR) tablet 20 mg  20 mg Oral Daily Ivor Costa, MD   20 mg at 07/04/2022 1705   buPROPion (WELLBUTRIN XL) 24 hr tablet 150 mg  150 mg Oral Daily Mansy, Jan A, MD   150 mg at 06/12/2022 0920   cholecalciferol (VITAMIN D3) tablet 1,000 Units  1,000 Units Oral Daily Mansy, Jan A, MD   1,000 Units at 06/29/2022 1448   cyclobenzaprine (FLEXERIL) tablet 5 mg  5 mg Oral TID PRN Ivor Costa, MD       diltiazem (CARDIZEM) 125 mg in dextrose 5% 125 mL (1 mg/mL) infusion  5-15 mg/hr Intravenous Titrated Mansy, Jan A, MD 7.5 mL/hr at 07/10/22 0500 7.5 mg/hr at 07/10/22 0500   docusate sodium (COLACE) capsule 100 mg  100 mg Oral BID PRN Mansy, Jan A, MD       ferrous sulfate tablet 325 mg  325 mg Oral Q1200 Mansy, Jan A, MD   325 mg at 07/08/2022 1248   gabapentin (NEURONTIN) capsule 200 mg  200 mg Oral TID Mansy, Jan A, MD   200 mg at 06/15/2022 2110   hydrALAZINE (APRESOLINE) injection 5 mg  5 mg Intravenous Q2H PRN Ivor Costa, MD       levothyroxine (SYNTHROID) tablet 250 mcg  250 mcg Oral Q0600 Mansy, Jan A, MD   250 mcg at 07/10/22 0552   magnesium hydroxide (MILK OF MAGNESIA) suspension 30 mL  30 mL Oral Daily PRN Mansy, Jan A, MD       meropenem (MERREM) 1 g in sodium chloride 0.9 % 100 mL IVPB  1 g Intravenous Q8H Wynelle Cleveland, [place] at  07/10/22 1856   metoprolol tartrate (LOPRESSOR) injection 5 mg  5 mg Intravenous Q2H PRN Ivor Costa, MD   5 mg at 06/24/2022 0841   multivitamin with minerals tablet 1 tablet  1 tablet Oral Daily Renda Rolls, RPH   1 tablet at 06/21/2022 0922   ondansetron (ZOFRAN) tablet 4 mg  4 mg Oral Q6H PRN Mansy, Jan A, MD       Or   ondansetron [facility]) injection 4 mg  4 mg Intravenous Q6H PRN Mansy, Arvella Merles, MD       Oral care mouth rinse  15 mL Mouth Rinse PRN Mansy, Jan A, MD       pantoprazole (PROTONIX) EC tablet 40 mg  40 mg Oral Daily Mansy, Jan  A, MD   40 mg at 06/26/2022 0922   polyethylene glycol (MIRALAX / GLYCOLAX) packet 17 g  17 g Oral Daily PRN Mansy, Jan A, MD       propranolol (INDERAL) tablet 20 mg  20 mg Oral BID Ivor Costa, MD   20 mg at 07/08/2022 1740   rOPINIRole (REQUIP) tablet 3 mg  3 mg Oral QHS Mansy, Jan A, MD   3 mg at 06/15/2022 2111   traZODone (DESYREL) tablet 25 mg  25 mg Oral QHS PRN Mansy, Jan A, MD       zinc sulfate capsule 220 mg  220 mg Oral Daily Mansy, Jan A, MD   220 mg at 06/26/2022 8144   Current Outpatient Medications  Medication Sig Dispense Refill   apixaban (ELIQUIS) 5 MG TABS tablet Take 1 tablet (5 mg total) by mouth 2 (two) times daily. 60 tablet 0   atorvastatin (LIPITOR) 20 MG tablet Take 20 mg by mouth daily.     Biotin 5 MG TABS Take 2.5 mg by mouth daily.     buPROPion (WELLBUTRIN XL) 150 MG 24 hr tablet Take 150 mg by mouth daily.     Cholecalciferol 25 MCG (1000 UT) tablet Take 1 tablet by mouth daily.     cyclobenzaprine (FLEXERIL) 5 MG tablet Take 5 mg by mouth 3 (three) times daily as needed for muscle spasms.     furosemide (LASIX) 40 MG tablet Take 1 tablet (40 mg total) by mouth 2 (two) times daily. Increase to 1 tablet (40 mg total) by mouth THREE TIMES daily (total daily dose 120 mg) as needed for up to 3 days for increased leg swelling, shortness of breath, weight gain 5+ lbs over 1-2 days. Seek medical care if these symptoms are not improving with  increased dose. Reduce dose to 1 tablet (40 mg total) by mouth ONCE DAILY if dizziness or low blood pressure on bid dose. 90 tablet 0   gabapentin (NEURONTIN) 100 MG capsule Take 2 capsules (200 mg total) by mouth 3 (three) times daily.     levothyroxine (SYNTHROID) 150 MCG tablet Take 150 mcg by mouth daily before breakfast.     linezolid (ZYVOX) 600 MG tablet Take 600 mg by mouth 2 (two) times daily.     Multiple Vitamin (MULTIVITAMIN) tablet Take 1 tablet by mouth daily.     naloxone (NARCAN) nasal spray 4 mg/0.1 mL Place 1 spray into the nose once.     pantoprazole (PROTONIX) 40 MG tablet Take 40 mg by mouth daily.     potassium chloride (KLOR-CON) 20 MEQ packet Take 20 mEq by mouth daily.     propranolol (INDERAL) 20 MG tablet Take 1 tablet (20 mg total) by mouth 2 (two) times daily. (Patient taking differently: Take 20-40 mg by mouth 2 (two) times daily.)     rOPINIRole (REQUIP) 3 MG tablet Take 1 tablet (3 mg total) by mouth at bedtime. 30 tablet 0   traMADol (ULTRAM) 50 MG tablet Take 50 mg by mouth every 6 (six) hours as needed for moderate pain.     zinc gluconate 50 MG tablet Take 50 mg by mouth daily.     acetaminophen (TYLENOL) 650 MG CR tablet Take 1,300 mg by mouth every 8 (eight) hours as needed for pain.     ALPRAZolam (XANAX) 0.25 MG tablet Take 1 tablet (0.25 mg total) by mouth 3 (three) times daily as needed for anxiety. 6 tablet 0   Catheter Self-Adhesive Urinary MISC  Continue PurWIck prn if available 1 each 0   docusate sodium (COLACE) 100 MG capsule Take 1 capsule (100 mg total) by mouth 2 (two) times daily as needed for mild constipation. 10 capsule 0   Iron, Ferrous Sulfate, 325 (65 Fe) MG TABS Take 325 mg by mouth daily at 12 noon. (Patient not taking: Reported on 06/23/2022) 30 tablet    levothyroxine (SYNTHROID) 125 MCG tablet Take 1 tablet (125 mcg total) by mouth daily. (Patient not taking: Reported on 07/02/2022) 30 tablet 0   metolazone (ZAROXOLYN) 2.5 MG tablet Take  2.5 mg by mouth daily as needed (if weight up > 5 pounds).     Mouthwashes (MOUTH RINSE) LIQD solution 15 mLs by Mouth Rinse route as needed (oral care).  0   polyethylene glycol (MIRALAX / GLYCOLAX) 17 g packet Take 17 g by mouth daily as needed for moderate constipation. 14 each 0   potassium chloride SA (KLOR-CON M) 20 MEQ tablet Take 20 mEq by mouth daily. (Patient not taking: Reported on 07/04/2022)     Semaglutide, 1 MG/DOSE, 2 MG/1.5ML SOPN Inject 0.75 mLs into the skin once a week.       Discharge Medications: Please see discharge summary for a list of discharge medications.  Relevant Imaging Results:  Relevant Lab Results:   Additional Information Ss# [id number]  Shelbie Hutching, RN

## 2022-07-11 DIAGNOSIS — N3000 Acute cystitis without hematuria: Secondary | ICD-10-CM | POA: Diagnosis not present

## 2022-07-11 DIAGNOSIS — A419 Sepsis, unspecified organism: Secondary | ICD-10-CM | POA: Diagnosis not present

## 2022-07-11 DIAGNOSIS — N1831 Chronic kidney disease, stage 3a: Secondary | ICD-10-CM | POA: Diagnosis not present

## 2022-07-11 DIAGNOSIS — E1122 Type 2 diabetes mellitus with diabetic chronic kidney disease: Secondary | ICD-10-CM | POA: Diagnosis not present

## 2022-07-11 LAB — MAGNESIUM: Magnesium: 2.1 mg/dL (ref 1.7–2.4)

## 2022-07-11 LAB — RENAL FUNCTION PANEL
Albumin: 3 g/dL — ABNORMAL LOW (ref 3.5–5.0)
Anion gap: 7 (ref 5–15)
BUN: 43 mg/dL — ABNORMAL HIGH (ref 8–23)
CO2: 35 mmol/L — ABNORMAL HIGH (ref 22–32)
Calcium: 9 mg/dL (ref 8.9–10.3)
Chloride: 99 mmol/L (ref 98–111)
Creatinine, Ser: 1.13 mg/dL — ABNORMAL HIGH (ref 0.44–1.00)
GFR, Estimated: 53 mL/min — ABNORMAL LOW (ref >60)
Glucose, Bld: 129 mg/dL — ABNORMAL HIGH (ref 70–99)
Phosphorus: 3 mg/dL (ref 2.5–4.6)
Potassium: 3.6 mmol/L (ref 3.5–5.1)
Sodium: 141 mmol/L (ref 135–145)

## 2022-07-11 LAB — CBC
HCT: 35.5 % — ABNORMAL LOW (ref 36.0–46.0)
Hemoglobin: 10.3 g/dL — ABNORMAL LOW (ref 12.0–15.0)
MCH: 25.1 pg — ABNORMAL LOW (ref 26.0–34.0)
MCHC: 29 g/dL — ABNORMAL LOW (ref 30.0–36.0)
MCV: 86.6 fL (ref 80.0–100.0)
Platelets: 279 10*3/uL (ref 150–400)
RBC: 4.1 MIL/uL (ref 3.87–5.11)
RDW: 22.9 % — ABNORMAL HIGH (ref 11.5–15.5)
WBC: 11.8 10*3/uL — ABNORMAL HIGH (ref 4.0–10.5)
nRBC: 0.3 % — ABNORMAL HIGH (ref 0.0–0.2)

## 2022-07-11 MED ORDER — METOPROLOL TARTRATE 5 MG/5ML IV SOLN: 2.5000 mg | INTRAVENOUS | Status: DC | PRN

## 2022-07-11 MED ORDER — METOPROLOL TARTRATE 50 MG PO TABS
50.0000 mg | ORAL_TABLET | Freq: Two times a day (BID) | ORAL | Status: DC
  Administered 2022-07-11 – 2022-07-15 (×9): 50 mg via ORAL
  Filled 2022-07-11 (×10): qty 1

## 2022-07-11 MED ORDER — METOPROLOL TARTRATE 50 MG PO TABS: 50.0000 mg | ORAL_TABLET | Freq: Two times a day (BID) | ORAL | Status: DC

## 2022-07-11 MED ORDER — TRAMADOL HCL 50 MG PO TABS: 50.0000 mg | ORAL_TABLET | Freq: Two times a day (BID) | ORAL | Status: DC | PRN

## 2022-07-11 MED ORDER — FUROSEMIDE 40 MG PO TABS
40.0000 mg | ORAL_TABLET | Freq: Two times a day (BID) | ORAL | Status: DC
  Administered 2022-07-11 – 2022-07-17 (×13): 40 mg via ORAL
  Filled 2022-07-11 (×13): qty 1

## 2022-07-11 NOTE — Progress Notes (Signed)
SATURATION QUALIFICATIONS: (This note is used to comply with regulatory documentation for home oxygen)  Patient Saturations on Room Air at Rest = 74%  Patient Saturations on Room Air while Ambulating = did not attempt 2/2 desat at rest  Patient Saturations on 3 Liters of oxygen while Ambulating = 96%

## 2022-07-11 NOTE — Progress Notes (Signed)
Pt had great difficulty standing with 2-person assist and FWW. Required 3 attempts and the bed to be raised so as to minimize distance from seated position to stand. Performance did seem to be limited by anxiety.

## 2022-07-11 NOTE — TOC Progression Note (Signed)
Transition of Care [facility]) - Progression Note    Patient Details  Name: Esmerelda Finnigan MRN: [id number] Date of Birth: 1956/02/03  [facility] CM/SW Contact  Gerilyn Pilgrim, LCSW Phone Number: 07/11/2022, 4:09 PM  Clinical Narrative:   Josem Kaufmann started [facility] [phone]. [facility] stated they cannot accept until 1/1 due to pharmacy being closed on Sunday.     Expected Discharge Plan: [place] Barriers to Discharge: Ship broker, Continued Medical Work up  Expected Discharge Plan and Services     Post Acute Care Choice: Resumption of Svcs/PTA Provider Living arrangements for the past 2 months: [place], Single Family Home                                       Social Determinants of Health (SDOH) Interventions [place]: No Food Insecurity (07/10/2022)  Housing: Low Risk  (07/10/2022)  Transportation Needs: No Transportation Needs (07/10/2022)  Utilities: Not At Risk (07/10/2022)  Tobacco Use: Low Risk  (07/10/2022)    Readmission Risk Interventions    06/26/2022   12:43 PM 03/07/2022    4:12 PM  Readmission Risk Prevention Plan  Transportation Screening Complete Complete  PCP or Specialist Appt within 5-7 Days  Complete  Home Care Screening  Complete  Medication Review (RN CM)  Complete  Medication Review (RN Care Manager) Complete   PCP or Specialist appointment within 3-5 days of discharge Complete   SW Recovery Care/Counseling Consult Complete   Skilled Nursing Facility Complete

## 2022-07-11 NOTE — Progress Notes (Signed)
Pt refuses CPAP. Pt aware one will be made available if she changes her mind.

## 2022-07-11 NOTE — Progress Notes (Addendum)
PROGRESS NOTE  Madison Mcbride OHY:[id number] DOB: [birth year]   PCP: Rusty Aus, MD  Patient is from: SNF  DOA: 06/14/2022 LOS: 2  Chief complaints Chief Complaint  Patient presents with   Tachycardia     Brief Narrative / Interim history: 66 year old F with PMH of A-fib on Eliquis, diastolic CHF, DM-2, HTN, HLD, hypothyroidism, thyroid cancer, anxiety, depression, morbid obesity, chronic back pain, OSA on CPAP and CKD-3A sent to ED with tachycardia with HR in 130 send admitted for sepsis due to urinary tract infection. In ED, she was tachycardic to 120s.  WBC 12.6.  UA concerning for UTI.  COVID-19, influenza and RSV PCR nonreactive.  CXR with cardiomegaly without infiltration or edema.  Cultures obtained.  Patient was started on IV meropenem due to history of ESBL, and IV Cardizem, admitted for further care.  Patient transitioned off IV Cardizem to p.o. metoprolol.  Apparently, urine culture canceled.  Continue IV meropenem.  Can go back to SNF on Monday.  Subjective: Seen and examined earlier this morning.  No major events overnight of this morning.  No complaints.  He denies chest pain, dyspnea, GI or UTI symptoms.  Objective: Vitals:   07/10/22 1936 07/11/22 0323 07/11/22 0731 07/11/22 1211  BP: 111/66 109/88 109/78 119/78  Pulse: (!) 104 (!) 111 (!) 102 88  Resp: '19 19 18 18  '$ Temp: 97.9 F (36.6 C) 98.3 F (36.8 C) 97.9 F (36.6 C) 97.7 F (36.5 C)  TempSrc: Oral Oral    SpO2: 94% 91% 92% 99%  Weight:      Height:        Examination:  GENERAL: No apparent distress.  Nontoxic. HEENT: MMM.  Vision and hearing grossly intact.  NECK: Supple.  No apparent JVD.  RESP:  No IWOB.  Fair aeration bilaterally. CVS:  RRR. Heart sounds normal.  ABD/GI/GU: BS+. Abd soft, NTND.  MSK/EXT:  Moves extremities. No apparent deformity. No edema.  SKIN: Skin changes consistent with chronic venous insufficiency. NEURO: Awake and alert. Oriented appropriately.  No apparent  focal neuro deficit. PSYCH: Calm. Normal affect.   Procedures:  None  Microbiology summarized: [id number], influenza and RSV PCR nonreactive Blood cultures NGTD Urine culture canceled by interface lab.  Assessment and plan: Principal Problem:   UTI (urinary tract infection) Active Problems:   Sepsis ([place])   Myocardial injury   Atrial fibrillation with RVR (HCC)   Chronic systolic CHF (congestive heart failure) (HCC)   HTN (hypertension)   Type II diabetes mellitus with renal manifestations (HCC)   HLD (hyperlipidemia)   Iron deficiency anemia   Chronic kidney disease, stage 3a ([place])   Hypothyroidism   Depression with anxiety   Morbid obesity with BMI of 50.0-59.9, adult ([place])  Sepsis due to urinary tract infection: POA.  Had tachycardia, tachypnea, leukocytosis with UTI.  Lactic acid within normal.  She reports some dysuria and pressure sensation with urination.  No fever.  Pro-Cal elevated to 0.31.  Blood cultures NGTD.  History of ESBL.  Unfortunately, his urine culture was canceled.  -Completed 3 days of IV meropenem, 12/28 through 12/31  Elevated troponin/demand ischemia/type II MI: Troponin level 38 --> 36 likely demand ischemia from the above and delayed clearance from CKD. -Continue home medications.   Chronic systolic CHF: TTE on 01/13/5008 with LVEF of 45 to 50% and mild to moderate MVR, TVR and moderate AVR.  Appears euvolemic on exam.  BNP elevated to 1300, slightly higher than baseline. On p.o. Lasix and metolazone at  home.  No cardiopulmonary symptoms.  Appears euvolemic on exam. -Resume home Lasix at 40 mg twice daily -Closely monitor fluid and respiratory status     Atrial fibrillation with RVR: Seems to be in sinus rhythm with intermittent tachycardia.  Started on Cardizem drip in ED.  Seems she is on Inderal and Eliquis at home. -Increase metoprolol to 50 mg twice daily.  Discontinue Inderal on discharge. -Continue Eliquis for anticoagulation.   Essential HTN:  Normotensive. -Meds as above.   Controlled NIDDM-2 with HLD and CKD-3A: A1c 5.3%.  CBG in 80s consistently. -Discontinue SSI and CBG monitoring -Continue Lipitor.   Iron deficiency anemia: H&H stable. Recent Labs    05/23/22 0627 05/24/22 0549 05/25/22 0437 05/26/22 0619 05/27/22 0526 05/28/22 0444 05/29/22 0528 07/08/22 1924 07/10/22 0443 07/11/22 0547  HGB 9.0* 9.4* 9.7* 9.3* 9.3* 9.0* 9.2* 11.1* 10.2* 10.3*  -Monitor as needed  CKD-3A: stable. Recent Labs    05/23/22 0627 05/24/22 0549 05/25/22 0437 05/26/22 0619 05/27/22 0526 05/28/22 0444 05/29/22 0528 07/08/22 1924 07/10/22 0443 07/11/22 0547  BUN 34* 31* 33* 33* 32* 33* 29* 47* 48* 43*  CREATININE 1.21* 1.10* 1.36* 1.28* 1.19* 1.17* 1.14* 1.39* 1.20* 1.13*  -Monitor intermittently as needed  Hypothyroidism: TSH slightly elevated -Continue home Synthroid. -Check TSH in 4 to 6 weeks   Depression with anxiety: Stable -Continue home medications  Generalized weakness/physical deconditioning: Patient is from SNF. -PT/OT recommended to return to SNF   Morbid obesity with BMI of 50.0-59.9, adult (HCC) Body mass index is 50.98 kg/m.          DVT prophylaxis:   apixaban (ELIQUIS) tablet 5 mg  Code Status: Full code Family Communication: None at bedside Level of care: Telemetry Cardiac Status is: Inpatient Remains inpatient appropriate because: Sepsis due to urinary tract infection and sinus tachycardia   Final disposition: SNF on 07/13/2022. Consultants:  None  Sch Meds:  Scheduled Meds:  apixaban  5 mg Oral BID   atorvastatin  20 mg Oral Daily   buPROPion  150 mg Oral Daily   cholecalciferol  1,000 Units Oral Daily   ferrous sulfate  325 mg Oral Q1200   furosemide  40 mg Oral BID   gabapentin  200 mg Oral TID   levothyroxine  250 mcg Oral Q0600   metoprolol tartrate  50 mg Oral BID   multivitamin with minerals  1 tablet Oral Daily   pantoprazole  40 mg Oral Daily   rOPINIRole  3 mg Oral  QHS   zinc sulfate  220 mg Oral Daily   Continuous Infusions:  meropenem (MERREM) IV 1 g (07/11/22 1245)   PRN Meds:.acetaminophen **OR** acetaminophen, ALPRAZolam, cyclobenzaprine, docusate sodium, magnesium hydroxide, metoprolol tartrate, ondansetron **OR** ondansetron (ZOFRAN) IV, mouth rinse, polyethylene glycol, traMADol, traZODone  Antimicrobials: Anti-infectives (From admission, onward)    Start     Dose/Rate Route Frequency Ordered Stop   06/17/2022 0815  meropenem (MERREM) 1 g in sodium chloride 0.9 % 100 mL IVPB        1 g 200 mL/hr over 30 Minutes Intravenous Every 8 hours 06/15/2022 0802     06/22/2022 0615  cefTRIAXone (ROCEPHIN) 2 g in sodium chloride 0.9 % 100 mL IVPB  Status:  Discontinued        2 g 200 mL/hr over 30 Minutes Intravenous Every 24 hours 07/05/2022 0610 07/05/2022 0613   07/08/22 1945  cefTRIAXone (ROCEPHIN) 2 g in sodium chloride 0.9 % 100 mL IVPB  Status:  Discontinued  2 g 200 mL/hr over 30 Minutes Intravenous Every 24 hours 07/08/22 1938 06/18/2022 0750        I have personally reviewed the following labs and images: CBC: Recent Labs  Lab 07/08/22 1924 07/10/22 0443 07/11/22 0547  WBC 12.6* 11.1* 11.8*  NEUTROABS 10.3*  --   --   HGB 11.1* 10.2* 10.3*  HCT 39.4 35.6* 35.5*  MCV 88.3 87.5 86.6  PLT 313 286 279   BMP &GFR Recent Labs  Lab 07/08/22 1924 07/10/22 0443 07/11/22 0547  NA 141 138 141  K 4.2 3.6 3.6  CL 97* 96* 99  CO2 33* 29 35*  GLUCOSE 96 95 129*  BUN 47* 48* 43*  CREATININE 1.39* 1.20* 1.13*  CALCIUM 8.6* 8.9 9.0  MG  --   --  2.1  PHOS  --   --  3.0   Estimated Creatinine Clearance: 65.2 mL/min (A) (by C-G formula based on SCr of 1.13 mg/dL (H)). Liver & Pancreas: Recent Labs  Lab 07/08/22 1924 07/11/22 0547  AST 28  --   ALT 14  --   ALKPHOS 110  --   BILITOT 1.6*  --   PROT 7.4  --   ALBUMIN 3.0* 3.0*   No results for input(s): "LIPASE", "AMYLASE" in the last 168 hours. No results for input(s):  "AMMONIA" in the last 168 hours. Diabetic: Recent Labs    07/08/22 1924  HGBA1C 5.3   Recent Labs  Lab 06/30/2022 0832 06/28/2022 1146 06/20/2022 1646 07/08/2022 2123 07/10/22 0759  GLUCAP 75 84 84 86 75   Cardiac Enzymes: No results for input(s): "CKTOTAL", "CKMB", "CKMBINDEX", "TROPONINI" in the last 168 hours. No results for input(s): "PROBNP" in the last 8760 hours. Coagulation Profile: Recent Labs  Lab 07/08/22 2250 07/10/22 0443  INR 2.3* 2.1*   Thyroid Function Tests: Recent Labs    07/10/22 0443  TSH 11.874*   Lipid Profile: Recent Labs    07/10/22 0443  CHOL 125  HDL 22*  LDLCALC 81  TRIG 111  CHOLHDL 5.7   Anemia Panel: No results for input(s): "VITAMINB12", "FOLATE", "FERRITIN", "TIBC", "IRON", "RETICCTPCT" in the last 72 hours. Urine analysis:    Component Value Date/Time   COLORURINE AMBER (A) 07/08/2022 2201   APPEARANCEUR CLOUDY (A) 07/08/2022 2201   APPEARANCEUR CLOUDY 01/15/2014 0209   LABSPEC 1.015 07/08/2022 2201   LABSPEC 1.014 01/15/2014 0209   PHURINE 5.0 07/08/2022 2201   GLUCOSEU NEGATIVE 07/08/2022 2201   GLUCOSEU NEGATIVE 01/15/2014 0209   HGBUR MODERATE (A) 07/08/2022 2201   BILIRUBINUR NEGATIVE 07/08/2022 2201   BILIRUBINUR NEGATIVE 01/15/2014 0209   KETONESUR NEGATIVE 07/08/2022 2201   PROTEINUR NEGATIVE 07/08/2022 2201   NITRITE NEGATIVE 07/08/2022 2201   LEUKOCYTESUR LARGE (A) 07/08/2022 2201   LEUKOCYTESUR 2+ 01/15/2014 0209   Sepsis Labs: Invalid input(s): "PROCALCITONIN", "LACTICIDVEN"  Microbiology: Recent Results (from the past 240 hour(s))  Culture, blood (Routine x 2)     Status: None (Preliminary result)   Collection Time: 07/08/22  7:39 PM   Specimen: BLOOD  Result Value Ref Range Status   Specimen Description BLOOD BLOOD RIGHT ARM  Final   Special Requests   Final    BOTTLES DRAWN AEROBIC AND ANAEROBIC Blood Culture adequate volume   Culture   Final    NO GROWTH 3 DAYS Performed at [facility],  [address] [place],  [address]    Report Status PENDING  Incomplete  Culture, blood (Routine x 2)  Status: None (Preliminary result)   Collection Time: 07/08/22  7:47 PM   Specimen: BLOOD  Result Value Ref Range Status   Specimen Description BLOOD BLOOD LEFT ARM  Final   Special Requests   Final    BOTTLES DRAWN AEROBIC AND ANAEROBIC Blood Culture adequate volume   Culture   Final    NO GROWTH 3 DAYS Performed at [facility], [address] [place], [place] [address]    Report Status PENDING  Incomplete  Resp panel by RT-PCR (RSV, Flu A&B, Covid) Anterior Nasal Swab     Status: None   Collection Time: 07/08/22  7:47 PM   Specimen: Anterior Nasal Swab  Result Value Ref Range Status   SARS Coronavirus 2 by RT PCR NEGATIVE NEGATIVE Final    Comment: (NOTE) SARS-CoV-2 target nucleic acids are NOT DETECTED.  The SARS-CoV-2 RNA is generally detectable in upper respiratory specimens during the acute phase of infection. The lowest concentration of SARS-CoV-2 viral copies this assay can detect is 138 copies/mL. A negative result does not preclude SARS-Cov-2 infection and should not be used as the sole basis for treatment or other patient management decisions. A negative result may occur with  improper specimen collection/handling, submission of specimen other than nasopharyngeal swab, presence of viral mutation(s) within the areas targeted by this assay, and inadequate number of viral copies(<138 copies/mL). A negative result must be combined with clinical observations, patient history, and epidemiological information. The expected result is Negative.  Fact Sheet for Patients:  EntrepreneurPulse.com.au  Fact Sheet for Healthcare Providers:  IncredibleEmployment.be  This test is no t yet approved or cleared by the [place] FDA and  has been authorized for detection and/or diagnosis of SARS-CoV-2 by FDA under an  Emergency Use Authorization (EUA). This EUA will remain  in effect (meaning this test can be used) for the duration of the COVID-19 declaration under Section 564(b)(1) of the Act, 21 U.S.C.section 360bbb-3(b)(1), unless the authorization is terminated  or revoked sooner.       Influenza A by PCR NEGATIVE NEGATIVE Final   Influenza B by PCR NEGATIVE NEGATIVE Final    Comment: (NOTE) The Xpert Xpress SARS-CoV-2/FLU/RSV plus assay is intended as an aid in the diagnosis of influenza from Nasopharyngeal swab specimens and should not be used as a sole basis for treatment. Nasal washings and aspirates are unacceptable for Xpert Xpress SARS-CoV-2/FLU/RSV testing.  Fact Sheet for Patients: EntrepreneurPulse.com.au  Fact Sheet for Healthcare Providers: IncredibleEmployment.be  This test is not yet approved or cleared by the [place] FDA and has been authorized for detection and/or diagnosis of SARS-CoV-2 by FDA under an Emergency Use Authorization (EUA). This EUA will remain in effect (meaning this test can be used) for the duration of the COVID-19 declaration under Section 564(b)(1) of the Act, 21 U.S.C. section 360bbb-3(b)(1), unless the authorization is terminated or revoked.     Resp Syncytial Virus by PCR NEGATIVE NEGATIVE Final    Comment: (NOTE) Fact Sheet for Patients: EntrepreneurPulse.com.au  Fact Sheet for Healthcare Providers: IncredibleEmployment.be  This test is not yet approved or cleared by the [place] FDA and has been authorized for detection and/or diagnosis of SARS-CoV-2 by FDA under an Emergency Use Authorization (EUA). This EUA will remain in effect (meaning this test can be used) for the duration of the COVID-19 declaration under Section 564(b)(1) of the Act, 21 U.S.C. section 360bbb-3(b)(1), unless the authorization is terminated or revoked.  Performed at [facility], [place],  [place], [place] [address]     Radiology Studies: No results found.    Naim Murtha T. [place]  If 7PM-7AM, please contact night-coverage [email or web address] 07/11/2022, 2:21 PM

## 2022-07-12 DIAGNOSIS — E1122 Type 2 diabetes mellitus with diabetic chronic kidney disease: Secondary | ICD-10-CM

## 2022-07-12 DIAGNOSIS — A419 Sepsis, unspecified organism: Principal | ICD-10-CM

## 2022-07-12 DIAGNOSIS — I5022 Chronic systolic (congestive) heart failure: Secondary | ICD-10-CM | POA: Diagnosis not present

## 2022-07-12 DIAGNOSIS — N1831 Chronic kidney disease, stage 3a: Secondary | ICD-10-CM

## 2022-07-12 NOTE — Progress Notes (Signed)
PROGRESS NOTE  Madison Mcbride KKX:[id number] DOB: 1956-03-05   PCP: Rusty Aus, MD  Patient is from: SNF  DOA: 07/06/2022 LOS: 3  Chief complaints Chief Complaint  Patient presents with   Tachycardia     Brief Narrative / Interim history: 66 year old F with PMH of A-fib on Eliquis, diastolic CHF, DM-2, HTN, HLD, hypothyroidism, thyroid cancer, anxiety, depression, morbid obesity, chronic back pain, OSA on CPAP and CKD-3A sent to ED with tachycardia with HR in 130 send admitted for sepsis due to urinary tract infection. In ED, she was tachycardic to 120s.  WBC 12.6.  UA concerning for UTI.  COVID-19, influenza and RSV PCR nonreactive.  CXR with cardiomegaly without infiltration or edema.  Cultures obtained.  Patient was started on IV meropenem due to history of ESBL, and IV Cardizem, admitted for further care.  Patient transitioned off IV Cardizem to p.o. metoprolol.  Apparently, urine culture canceled.  Continue IV meropenem.  Can go back to SNF on Monday.  Subjective: Seen and examined earlier this morning.  No major events overnight of this morning.  No complaints.  He denies chest pain, dyspnea, GI or UTI symptoms.  Objective: Vitals:   07/11/22 2147 07/11/22 2320 07/12/22 0510 07/12/22 0938  BP: 119/82 115/76 101/77 (!) 121/94  Pulse: 72 95 95 89  Resp: '19 18 18 18  '$ Temp: 98 F (36.7 C) 98 F (36.7 C) 98.2 F (36.8 C) 98.3 F (36.8 C)  TempSrc: Oral Oral Oral Oral  SpO2: 98% 97% 98% 100%  Weight:      Height:        Examination:  GENERAL: No apparent distress.  Nontoxic. HEENT: MMM.  Vision and hearing grossly intact.  NECK: Supple.  No apparent JVD.  RESP:  No IWOB.  Fair aeration bilaterally. CVS:  RRR. Heart sounds normal.  ABD/GI/GU: BS+. Abd soft, NTND.  MSK/EXT:  Moves extremities. No apparent deformity. No edema.  SKIN: Skin changes consistent with chronic venous insufficiency. NEURO: Awake and alert. Oriented appropriately.  No apparent focal  neuro deficit. PSYCH: Calm. Normal affect.   Procedures:  None  Microbiology summarized: [id number], influenza and RSV PCR nonreactive Blood cultures NGTD Urine culture canceled by interface lab.  Assessment and plan: Principal Problem:   UTI (urinary tract infection) Active Problems:   Sepsis ([place])   Myocardial injury   Atrial fibrillation with RVR (HCC)   Chronic systolic CHF (congestive heart failure) (HCC)   HTN (hypertension)   Type II diabetes mellitus with renal manifestations (HCC)   HLD (hyperlipidemia)   Iron deficiency anemia   Chronic kidney disease, stage 3a ([place])   Hypothyroidism   Depression with anxiety   Morbid obesity with BMI of 50.0-59.9, adult ([place])  Sepsis due to urinary tract infection: POA.  Had tachycardia, tachypnea, leukocytosis with UTI.  Lactic acid within normal.  She reports some dysuria and pressure sensation with urination.  No fever.  Pro-Cal elevated to 0.31.  Blood cultures NGTD.  History of ESBL.  Unfortunately, her urine culture was canceled.  -Completed 3 days of IV meropenem, 12/28 through 12/31  Elevated troponin/demand ischemia/type II MI: Troponin level 38 --> 36 likely demand ischemia from the above and delayed clearance from CKD. -Continue home medications.   Chronic systolic CHF: TTE on 3/81/0175 with LVEF of 45 to 50% and mild to moderate MVR, TVR and moderate AVR.  Appears euvolemic on exam.  BNP elevated to 1300, slightly higher than baseline. On p.o. Lasix and metolazone at home.  No cardiopulmonary symptoms.  Appears euvolemic on exam. -Resume home Lasix at 40 mg twice daily -Closely monitor fluid and respiratory status     Atrial fibrillation with RVR: Seems to be in sinus rhythm with intermittent tachycardia.  Started on Cardizem drip in ED.  Seems she is on Inderal and Eliquis at home. -Increase metoprolol to 50 mg twice daily.  Discontinue Inderal on discharge. -Continue Eliquis for anticoagulation.   Essential HTN:  Normotensive. -Meds as above.   Controlled NIDDM-2 with HLD and CKD-3A: A1c 5.3%.  CBG in 80s consistently. -Discontinue SSI and CBG monitoring -Continue Lipitor.   Iron deficiency anemia: H&H stable. Recent Labs    05/23/22 0627 05/24/22 0549 05/25/22 0437 05/26/22 0619 05/27/22 0526 05/28/22 0444 05/29/22 0528 07/08/22 1924 07/10/22 0443 07/11/22 0547  HGB 9.0* 9.4* 9.7* 9.3* 9.3* 9.0* 9.2* 11.1* 10.2* 10.3*  -Monitor as needed  CKD-3A: stable. Recent Labs    05/23/22 0627 05/24/22 0549 05/25/22 0437 05/26/22 0619 05/27/22 0526 05/28/22 0444 05/29/22 0528 07/08/22 1924 07/10/22 0443 07/11/22 0547  BUN 34* 31* 33* 33* 32* 33* 29* 47* 48* 43*  CREATININE 1.21* 1.10* 1.36* 1.28* 1.19* 1.17* 1.14* 1.39* 1.20* 1.13*  -Monitor intermittently as needed  Hypothyroidism: TSH slightly elevated -Continue home Synthroid. -Check TSH in 4 to 6 weeks   Depression with anxiety: Stable -Continue home medications  Generalized weakness/physical deconditioning: Patient is from SNF. -PT/OT recommended to return to SNF   Morbid obesity with BMI of 50.0-59.9, adult (HCC) Body mass index is 50.98 kg/m.          DVT prophylaxis:   apixaban (ELIQUIS) tablet 5 mg  Code Status: Full code Family Communication: None at bedside Level of care: Telemetry Cardiac Status is: Inpatient  Remains inpatient appropriate because: Sepsis due to urinary tract infection and sinus tachycardia. Remains on IV antibiotics pending culture results   Final disposition: SNF on 07/13/2022.   Consultants:  None  Labs & Data Reviewed:   Notable labs --- bicarb 35, glucose 129, BUN 43, Cr 1.13 from 1.20, albumin 3.0.  CBC with WBC 11.8k from 11.1, Hbg 10.3 stable    Dr. Nicole Kindred, DO [facility]  If 7PM-7AM, please contact night-coverage [email or web address] 07/12/2022, 1:56 PM

## 2022-07-13 DIAGNOSIS — N1831 Chronic kidney disease, stage 3a: Secondary | ICD-10-CM

## 2022-07-13 DIAGNOSIS — E1122 Type 2 diabetes mellitus with diabetic chronic kidney disease: Secondary | ICD-10-CM

## 2022-07-13 DIAGNOSIS — I5022 Chronic systolic (congestive) heart failure: Secondary | ICD-10-CM | POA: Diagnosis not present

## 2022-07-13 LAB — CBC
HCT: 37.3 % (ref 36.0–46.0)
Hemoglobin: 10.6 g/dL — ABNORMAL LOW (ref 12.0–15.0)
MCH: 25.4 pg — ABNORMAL LOW (ref 26.0–34.0)
MCHC: 28.4 g/dL — ABNORMAL LOW (ref 30.0–36.0)
MCV: 89.2 fL (ref 80.0–100.0)
Platelets: 311 10*3/uL (ref 150–400)
RBC: 4.18 MIL/uL (ref 3.87–5.11)
RDW: 22.9 % — ABNORMAL HIGH (ref 11.5–15.5)
WBC: 13.9 10*3/uL — ABNORMAL HIGH (ref 4.0–10.5)
nRBC: 0.8 % — ABNORMAL HIGH (ref 0.0–0.2)

## 2022-07-13 LAB — CULTURE, BLOOD (ROUTINE X 2)
Culture: NO GROWTH
Culture: NO GROWTH
Special Requests: ADEQUATE
Special Requests: ADEQUATE

## 2022-07-13 LAB — BASIC METABOLIC PANEL
Anion gap: 10 (ref 5–15)
BUN: 31 mg/dL — ABNORMAL HIGH (ref 8–23)
CO2: 35 mmol/L — ABNORMAL HIGH (ref 22–32)
Calcium: 8.5 mg/dL — ABNORMAL LOW (ref 8.9–10.3)
Chloride: 96 mmol/L — ABNORMAL LOW (ref 98–111)
Creatinine, Ser: 0.9 mg/dL (ref 0.44–1.00)
GFR, Estimated: >60 mL/min (ref >60)
Glucose, Bld: 98 mg/dL (ref 70–99)
Potassium: 3.6 mmol/L (ref 3.5–5.1)
Sodium: 141 mmol/L (ref 135–145)

## 2022-07-13 MED ORDER — SODIUM CHLORIDE 0.9 % IV SOLN
1.0000 g | Freq: Three times a day (TID) | INTRAVENOUS | Status: AC
  Administered 2022-07-13 – 2022-07-14 (×4): 1 g via INTRAVENOUS
  Filled 2022-07-13 (×5): qty 20
  Filled 2022-07-13: qty 1

## 2022-07-13 NOTE — Care Management Important Message (Signed)
Important Message  Patient Details  Name: Madison Mcbride MRN: [id number] Date of Birth: 01-16-1956   Medicare Important Message Given:  Other (see comment)  Attempted to reach patient via room phone [phone]) to review Medicare IM, unable to reach at this time.   Dannette Barbara 07/13/2022, 9:53 AM

## 2022-07-13 NOTE — Progress Notes (Signed)
PROGRESS NOTE  Madison Mcbride UXN:[id number] DOB: Mar 11, 1956   PCP: Rusty Aus, MD  Patient is from: SNF  DOA: 06/14/2022 LOS: 4  Chief complaints Chief Complaint  Patient presents with   Tachycardia     Brief Narrative / Interim history: 67 year old F with PMH of A-fib on Eliquis, diastolic CHF, DM-2, HTN, HLD, hypothyroidism, thyroid cancer, anxiety, depression, morbid obesity, chronic back pain, OSA on CPAP and CKD-3A sent to ED with tachycardia with HR in 130 send admitted for sepsis due to urinary tract infection. In ED, she was tachycardic to 120s.  WBC 12.6.  UA concerning for UTI.  COVID-19, influenza and RSV PCR nonreactive.  CXR with cardiomegaly without infiltration or edema.  Cultures obtained.  Patient was started on IV meropenem due to history of ESBL, and IV Cardizem, admitted for further care.  Patient transitioned off IV Cardizem to p.o. metoprolol.  Apparently, urine culture canceled.  Continue IV meropenem.  Can go back to SNF on Monday.  Subjective: Seen and examined earlier this morning.  No major events overnight of this morning.  No complaints.  He denies chest pain, dyspnea, GI or UTI symptoms.  Objective: Vitals:   07/13/22 0037 07/13/22 0328 07/13/22 0410 07/13/22 0926  BP: 100/83 121/83 121/83 109/87  Pulse: 90 84 84 82  Resp: '19 19 19 18  '$ Temp: 98 F (36.7 C) 98 F (36.7 C) 98 F (36.7 C) 98.2 F (36.8 C)  TempSrc: Oral Oral Oral Oral  SpO2: 97% 99% 99% 98%  Weight:      Height:        Examination:  GENERAL: No apparent distress.  Nontoxic. HEENT: MMM.  Vision and hearing grossly intact.  NECK: Supple.  No apparent JVD.  RESP:  No IWOB.  Fair aeration bilaterally. CVS:  RRR. Heart sounds normal.  ABD/GI/GU: BS+. Abd soft, NTND.  MSK/EXT:  Moves extremities. No apparent deformity. No edema.  SKIN: Skin changes consistent with chronic venous insufficiency. NEURO: Awake and alert. Oriented appropriately.  No apparent focal neuro  deficit. PSYCH: Calm. Normal affect.   Procedures:  None  Microbiology summarized: URKYH-06, influenza and RSV PCR nonreactive Blood cultures NGTD Urine culture canceled by interface lab.  Assessment and plan: Principal Problem:   UTI (urinary tract infection) Active Problems:   Sepsis ([place])   Myocardial injury   Atrial fibrillation with RVR (HCC)   Chronic systolic CHF (congestive heart failure) (HCC)   HTN (hypertension)   Type II diabetes mellitus with renal manifestations (HCC)   HLD (hyperlipidemia)   Iron deficiency anemia   Chronic kidney disease, stage 3a ([place])   Hypothyroidism   Depression with anxiety   Morbid obesity with BMI of 50.0-59.9, adult ([place])  Sepsis due to urinary tract infection: POA.  Had tachycardia, tachypnea, leukocytosis with UTI.  Lactic acid within normal.  She reports some dysuria and pressure sensation with urination.  No fever.  Pro-Cal elevated to 0.31.  Blood cultures NGTD.  History of ESBL.  Unfortunately, her urine culture was canceled.  -Continue meropenem to complete 5 day course -Follow urine culture  Leukocytosis - had improved, but now WBC 13.9 from 11.8 yesterday. Initial urine culture was cancelled and not run. Repeat urine culture ordered and pending. Continue antibiotics as above.  Elevated troponin/demand ischemia/type II MI: Troponin level 38 --> 36 likely demand ischemia from the above and delayed clearance from CKD. -Continue home medications.   Chronic systolic CHF: TTE on 2/37/6283 with LVEF of 45 to 50% and  mild to moderate MVR, TVR and moderate AVR.  Appears euvolemic on exam.  BNP elevated to 1300, slightly higher than baseline. On p.o. Lasix and metolazone at home.  No cardiopulmonary symptoms.  Appears euvolemic on exam. -Resume home Lasix at 40 mg twice daily -Closely monitor fluid and respiratory status     Atrial fibrillation with RVR: Seems to be in sinus rhythm with intermittent tachycardia.  Started on Cardizem  drip in ED.  Seems she is on Inderal and Eliquis at home. -Increase metoprolol to 50 mg twice daily.   -Discontinue Inderal on discharge. -Continue Eliquis for anticoagulation.   Essential HTN: Normotensive. -Meds as above.   Controlled NIDDM-2 with HLD and CKD-3A: A1c 5.3%.  CBG in 80s consistently. -Discontinue SSI and CBG monitoring -Continue Lipitor.   Iron deficiency anemia: H&H stable. -Monitor as needed  CKD-3A: stable. -Monitor intermittently as needed  Hypothyroidism: TSH slightly elevated -Continue home Synthroid. -Check TSH in 4 to 6 weeks   Depression with anxiety: Stable -Continue home medications  Generalized weakness/physical deconditioning: Patient is from SNF. -PT/OT recommended to return to SNF   Morbid obesity with BMI of 50.0-59.9, adult ([place]) Body mass index is 50.98 kg/m.          DVT prophylaxis:   apixaban (ELIQUIS) tablet 5 mg  Code Status: Full code Family Communication: None at bedside Level of care: Telemetry Cardiac Status is: Inpatient  Remains inpatient appropriate because: Sepsis due to urinary tract infection and sinus tachycardia. Remains on IV antibiotics pending culture results   Final disposition: SNF - insurance Josem Kaufmann is pending.   Consultants:  None  Labs & Data Reviewed:   Notable labs --- Cl 96, CO2 35, BUN 31 improved, Cr 0.90 from 1.13 WBC increased 11.8 >> 13.9k today  Repeat urine culture pending --- initial culture on admission was somehow cancelled.  Worsening leukocytosis - need culture.    Dr. Nicole Kindred, DO [facility]  If 7PM-7AM, please contact night-coverage [email or web address] 07/13/2022, 4:21 PM

## 2022-07-13 NOTE — TOC Progression Note (Signed)
Transition of Care [facility]) - Progression Note    Patient Details  Name: Randell Detter MRN: [id number] Date of Birth: 1956-03-30  Transition of Care [facility]) CM/SW [place], [place] Phone Number: 07/13/2022, 12:26 PM  Clinical Narrative:     Update: insurance auth for SNF went to peer to peer, MD updated that this is needed today by 4:30 number is [phone] option 5     Insurance authorization in [place] portal indicates still in "pending" status at this time.   Expected Discharge Plan: [place] Barriers to Discharge: Ship broker, Continued Medical Work up  Expected Discharge Plan and Services     Post Acute Care Choice: Resumption of Svcs/PTA Provider Living arrangements for the past 2 months: [place], Single Family Home                                       Social Determinants of Health (SDOH) Interventions [place]: No Food Insecurity (07/10/2022)  Housing: Low Risk  (07/10/2022)  Transportation Needs: No Transportation Needs (07/10/2022)  Utilities: Not At Risk (07/10/2022)  Tobacco Use: Low Risk  (07/10/2022)    Readmission Risk Interventions    06/26/2022   12:43 PM 03/07/2022    4:12 PM  Readmission Risk Prevention Plan  Transportation Screening Complete Complete  PCP or Specialist Appt within 5-7 Days  Complete  Home Care Screening  Complete  Medication Review (RN CM)  Complete  Medication Review (RN Care Manager) Complete   PCP or Specialist appointment within 3-5 days of discharge Complete   SW Recovery Care/Counseling Consult Complete   Skilled Nursing Facility Complete

## 2022-07-13 DEATH — deceased

## 2022-07-14 DIAGNOSIS — D509 Iron deficiency anemia, unspecified: Secondary | ICD-10-CM

## 2022-07-14 LAB — CBC
HCT: 38.4 % (ref 36.0–46.0)
Hemoglobin: 10.7 g/dL — ABNORMAL LOW (ref 12.0–15.0)
MCH: 25 pg — ABNORMAL LOW (ref 26.0–34.0)
MCHC: 27.9 g/dL — ABNORMAL LOW (ref 30.0–36.0)
MCV: 89.7 fL (ref 80.0–100.0)
Platelets: 337 10*3/uL (ref 150–400)
RBC: 4.28 MIL/uL (ref 3.87–5.11)
RDW: 23.5 % — ABNORMAL HIGH (ref 11.5–15.5)
WBC: 13.6 10*3/uL — ABNORMAL HIGH (ref 4.0–10.5)
nRBC: 0.8 % — ABNORMAL HIGH (ref 0.0–0.2)

## 2022-07-14 LAB — T4, FREE: Free T4: 1.2 ng/dL — ABNORMAL HIGH (ref 0.61–1.12)

## 2022-07-14 LAB — URINE CULTURE: Culture: NO GROWTH

## 2022-07-14 NOTE — Progress Notes (Addendum)
PROGRESS NOTE  Madison Mcbride WRU:[id number] DOB: 1956-01-02   PCP: Rusty Aus, MD  Patient is from: SNF  DOA: 07/11/2022 LOS: 5  Chief complaints Chief Complaint  Patient presents with   Tachycardia     Brief Narrative / Interim history: 67 year old F with PMH of A-fib on Eliquis, diastolic CHF, DM-2, HTN, HLD, hypothyroidism, thyroid cancer, anxiety, depression, morbid obesity, chronic back pain, OSA on CPAP and CKD-3A sent to ED with tachycardia with HR in 130 send admitted for sepsis due to urinary tract infection.  In ED, she was tachycardic to 120s.  WBC 12.6.  UA concerning for UTI.  COVID-19, influenza and RSV PCR nonreactive.  CXR with cardiomegaly without infiltration or edema.  Cultures obtained.  Patient was started on IV meropenem due to history of ESBL, and IV Cardizem, admitted for further care.  Patient transitioned off IV Cardizem to p.o. metoprolol.  Apparently, urine culture canceled.  Continue IV meropenem, will complete course today (1/2).    PT/OT recommend return to SNF for rehab.  Pt remains profoundly weak.  SNF placement pending.  Insurance now Surveyor, quantity.   Subjective: Pt reports feel well overall.  No fever/chills, dysuria, SOB, chst pain, cough, congestion, abdominal pain N/V/D.  Remains very weak and seems motivated for progress at rehab.    Objective: Vitals:   07/13/22 2204 07/14/22 0320 07/14/22 0820 07/14/22 1116  BP: 108/76 (!) 124/99 122/75 128/83  Pulse: 99 88 86 90  Resp:   18 19  Temp: 98.2 F (36.8 C) 98.5 F (36.9 C) 98.3 F (36.8 C) 98.4 F (36.9 C)  TempSrc: Oral Oral Oral Oral  SpO2: 97% 92% 93% 92%  Weight:   132.2 kg   Height:        Examination:  General exam: awake, alert, no acute distress, obese HEENT: moist mucus membranes, hearing grossly normal  Respiratory system: CTAB diminished bases, no wheezes, rales or rhonchi, normal respiratory effort. Cardiovascular system: normal S1/S2, RRR,  no pedal edema.    Gastrointestinal system: soft, NT, ND, no HSM felt, +bowel sounds. Central nervous system: A&O x. no gross focal neurologic deficits, normal speech Extremities: moves all, chronic BLE venous stasis hyperpigmentation, normal tone Skin: dry, intact, normal temperature Psychiatry: normal mood, congruent affect, judgement and insight appear normal   Procedures:  None  Microbiology summarized: COVID-19, influenza and RSV PCR nonreactive Blood cultures NGTD Urine culture canceled by interface lab.  Assessment and plan: Principal Problem:   UTI (urinary tract infection) Active Problems:   Sepsis ([place])   Myocardial injury   Atrial fibrillation with RVR (HCC)   Chronic systolic CHF (congestive heart failure) (HCC)   HTN (hypertension)   Type II diabetes mellitus with renal manifestations (HCC)   HLD (hyperlipidemia)   Iron deficiency anemia   Chronic kidney disease, stage 3a ([place])   Hypothyroidism   Depression with anxiety   Morbid obesity with BMI of 50.0-59.9, adult ([place])  Sepsis due to urinary tract infection: POA.  Had tachycardia, tachypnea, leukocytosis with UTI.  Lactic acid within normal.  She reports some dysuria and pressure sensation with urination.  No fever.  Pro-Cal elevated to 0.31.  Blood cultures NGTD.  History of ESBL.  Unfortunately, her urine culture was canceled.  -Will finish 5 day course of meropenem today  Leukocytosis - had improvedt to 11's, but increased yesterday to WBC 13.9, today 13.6. Initial urine culture was cancelled and not run. Repeat urine culture ordered 1/1 - no growth Finishing antibiotics today  Monitor CBC and for s/sx's of infection.  Elevated troponin/demand ischemia/type II MI: Troponin level 38 --> 36 likely demand ischemia from the above and delayed clearance from CKD. -Continue home medications.   Chronic systolic CHF: TTE on 8/75/6433 with LVEF of 45 to 50% and mild to moderate MVR, TVR and moderate AVR.  Appears euvolemic on exam.   BNP elevated to 1300, slightly higher than baseline. On p.o. Lasix and metolazone at home.  No cardiopulmonary symptoms.  Appears euvolemic on exam. -Resume home Lasix at 40 mg twice daily -Closely monitor fluid and respiratory status     Atrial fibrillation with RVR: Seems to be in sinus rhythm with intermittent tachycardia.  Started on Cardizem drip in ED.  Seems she is on Inderal and Eliquis at home. -Increase metoprolol to 50 mg twice daily.   -Discontinue Inderal on discharge. -Continue Eliquis for anticoagulation.   Essential HTN: Normotensive. -Meds as above.   Controlled NIDDM-2 with HLD and CKD-3A: A1c 5.3%.  CBG in 80s consistently. -Discontinue SSI and CBG monitoring -Continue Lipitor.   Iron deficiency anemia: H&H stable. -Monitor as needed  CKD-3A: stable. -Monitor intermittently as needed  Hypothyroidism: TSH slightly elevated -Continue home Synthroid. -Check TSH in 4 to 6 weeks   Depression with anxiety: Stable -Continue home medications  Generalized weakness/physical deconditioning: Patient is from SNF. -PT/OT recommended to return to SNF   Morbid obesity with BMI of 50.0-59.9, adult ([place]) Body mass index is 50.04 kg/m.          DVT prophylaxis:   apixaban (ELIQUIS) tablet 5 mg  Code Status: Full code Family Communication: None at bedside Level of care: Telemetry Cardiac Status is: Inpatient  Remains inpatient appropriate because: Unsafe discharge. Due to profound weakness, requires SNF/rehab placement.   Final disposition: SNF - insurance Josem Kaufmann is pending.  Peer-to-peer requested, will call this afternoon for that.   Consultants:  None  Labs & Data Reviewed:   Notable labs --- WBC increased 11.8 >> 13.9 >> 13.6k today   Repeated urine culture as initial culture on admission was somehow cancelled -- No Growth    Dr. Nicole Kindred, DO [facility]  If 7PM-7AM, please contact night-coverage [email or web address] 07/14/2022, 2:51 PM

## 2022-07-14 NOTE — Progress Notes (Signed)
Physical Therapy Treatment Patient Details Name: [name] MRN: [id number] DOB: [birth year] Today's Date: 07/14/2022   History of Present Illness Madison Mcbride is a 67 y.o. female  brought to the ED via EMS from SNF for tachycardia x 10-12 hours. Patient states she has been on 3 rounds of abx for UTI. Currently treated for UTI. Ot is being treated for possible  A-FIb as per Nurse.    PT Comments    Pt received in bed agreeable to PT intervention. Pt participated in bed mobility with mod assist from rolling, max for supine <->sit and max of 2+ for STS. Pt practiced scooting towards the head of the be with max of 1. Pt participated in AROM to BLE and same provided in written on  white board as exs during the day every 2 hours 10 reps. Pt demonstrated good understanding. Pt will benefit form continued PT in acute and SNF after acute to return to PLOF.       Recommendations for follow up therapy are one component of a multi-disciplinary discharge planning process, led by the attending physician.  Recommendations may be updated based on patient status, additional functional criteria and insurance authorization.  Follow Up Recommendations  Skilled nursing-short term rehab (<3 hours/day) Can patient physically be transported by private vehicle: No   Assistance Recommended at Discharge Frequent or constant Supervision/Assistance  Patient can return home with the following Two people to help with walking and/or transfers;Two people to help with bathing/dressing/bathroom;Assistance with cooking/housework;Assistance with feeding;Direct supervision/assist for medications management;Assist for transportation;Help with stairs or ramp for entrance   Equipment Recommendations  None recommended by PT    Recommendations for Other Services       Precautions / Restrictions Precautions Precautions: Fall Restrictions Weight Bearing Restrictions: No     Mobility  Bed Mobility Overal  bed mobility: Needs Assistance Bed Mobility: Supine to Sit, Sit to Supine     Supine to sit: Max assist, Mod assist Sit to supine: Max assist   General bed mobility comments: Pt sat on EOB x15 minutes, supervision; Good sitting balance.    Transfers Overall transfer level: Needs assistance Equipment used: Rolling walker (2 wheels) Transfers: Sit to/from Stand Sit to Stand: Max assist, +2 physical assistance (with FWW)           General transfer comment: +2 needed with FWW    Ambulation/Gait               General Gait Details: deferred   Stairs             Wheelchair Mobility    Modified Rankin (Stroke Patients Only)       Balance Overall balance assessment: Needs assistance Sitting-balance support: Bilateral upper extremity supported, Feet supported Sitting balance-Leahy Scale: Normal     Standing balance support: Bilateral upper extremity supported, Reliant on assistive device for balance Standing balance-Leahy Scale: Poor (with FWW) Standing balance comment: B knee pain with STS                            Cognition Arousal/Alertness: Awake/alert Behavior During Therapy: WFL for tasks assessed/performed Overall Cognitive Status: Within Functional Limits for tasks assessed                                 General Comments: Pleasant and cooperative.        Exercises General Exercises -  Lower Extremity Ankle Circles/Pumps: AROM, 10 reps, Supine Long Arc Quad: AROM, Both, 10 reps, Seated Heel Slides: AROM, Both, 10 reps, Supine Hip Flexion/Marching: AROM, 10 reps, Seated, Both    General Comments        Pertinent Vitals/Pain Pain Assessment Pain Assessment: No/denies pain    Home Living                          Prior Function            PT Goals (current goals can now be found in the care plan section) Acute Rehab PT Goals Patient Stated Goal: " I want to be able to walk to and form bathroom  so that I can go home with help." PT Goal Formulation: With patient Time For Goal Achievement: 07/23/22 Potential to Achieve Goals: Fair Progress towards PT goals: Progressing toward goals    Frequency    Min 2X/week      PT Plan Current plan remains appropriate    Co-evaluation              AM-PAC PT "6 Clicks" Mobility   Outcome Measure  Help needed turning from your back to your side while in a flat bed without using bedrails?: A Little Help needed moving from lying on your back to sitting on the side of a flat bed without using bedrails?: A Lot Help needed moving to and from a bed to a chair (including a wheelchair)?: A Lot Help needed standing up from a chair using your arms (e.g., wheelchair or bedside chair)?: Total Help needed to walk in hospital room?: Total Help needed climbing 3-5 steps with a railing? : Total 6 Click Score: 10    End of Session Equipment Utilized During Treatment: Gait belt;Oxygen Activity Tolerance: Patient limited by fatigue;Treatment limited secondary to medical complications (Comment) Patient left: in bed;with call bell/phone within reach;with family/visitor present Nurse Communication: Mobility status PT Visit Diagnosis: Unsteadiness on feet (R26.81);History of falling (Z91.81);Muscle weakness (generalized) (M62.81);Difficulty in walking, not elsewhere classified (R26.2)     Time: 0712-1975 PT Time Calculation (min) (ACUTE ONLY): 26 min  Charges:  $Therapeutic Activity: 8-22 mins                    Sacoya Mcgourty PT DPT 1:21 PM,07/14/22

## 2022-07-15 DIAGNOSIS — N3000 Acute cystitis without hematuria: Secondary | ICD-10-CM

## 2022-07-15 DIAGNOSIS — R000 Tachycardia, unspecified: Secondary | ICD-10-CM

## 2022-07-15 DIAGNOSIS — N390 Urinary tract infection, site not specified: Secondary | ICD-10-CM

## 2022-07-15 DIAGNOSIS — R390 Extravasation of urine: Secondary | ICD-10-CM

## 2022-07-15 DIAGNOSIS — N179 Acute kidney failure, unspecified: Secondary | ICD-10-CM

## 2022-07-15 DIAGNOSIS — R6510 Systemic inflammatory response syndrome (SIRS) of non-infectious origin without acute organ dysfunction: Principal | ICD-10-CM

## 2022-07-15 LAB — BASIC METABOLIC PANEL
Anion gap: 9 (ref 5–15)
BUN: 41 mg/dL — ABNORMAL HIGH (ref 8–23)
CO2: 34 mmol/L — ABNORMAL HIGH (ref 22–32)
Calcium: 8.7 mg/dL — ABNORMAL LOW (ref 8.9–10.3)
Chloride: 98 mmol/L (ref 98–111)
Creatinine, Ser: 1.11 mg/dL — ABNORMAL HIGH (ref 0.44–1.00)
GFR, Estimated: 54 mL/min — ABNORMAL LOW (ref >60)
Glucose, Bld: 120 mg/dL — ABNORMAL HIGH (ref 70–99)
Potassium: 4.5 mmol/L (ref 3.5–5.1)
Sodium: 141 mmol/L (ref 135–145)

## 2022-07-15 LAB — MAGNESIUM: Magnesium: 2.3 mg/dL (ref 1.7–2.4)

## 2022-07-15 LAB — GLUCOSE, CAPILLARY
Glucose-Capillary: 118 mg/dL — ABNORMAL HIGH (ref 70–99)
Glucose-Capillary: 110 mg/dL — ABNORMAL HIGH (ref 70–99)

## 2022-07-15 MED ORDER — METOPROLOL TARTRATE 50 MG PO TABS: 50.0000 mg | ORAL_TABLET | Freq: Two times a day (BID) | ORAL | Status: DC

## 2022-07-15 MED ORDER — LEVOTHYROXINE SODIUM 125 MCG PO TABS: 250.0000 ug | ORAL_TABLET | Freq: Every day | ORAL | Status: AC

## 2022-07-15 NOTE — TOC Transition Note (Signed)
Transition of Care [facility]) - CM/SW Discharge Note   Patient Details  Name: Madison Mcbride MRN: [id number] Date of Birth: 08/29/1955  [facility]) CM/SW Contact:  Tiburcio Bash, LCSW Phone Number: 07/15/2022, 11:33 AM   Clinical Narrative:     Patient will DC to: [facility] Anticipated DC date: 07/15/22 Family notified:spouse Transport GU:[id number]  Per MD patient ready for DC to [facility] . RN, patient, patient's family, and facility notified of DC. Discharge Summary sent to facility. RN given number for report [phone]. DC packet on chart. Ambulance transport requested for patient.  CSW signing off.  Pricilla Riffle, LCSW   Final next level of care: Skilled Nursing Facility Barriers to Discharge: No Barriers Identified   Patient Goals and CMS Choice CMS Medicare.gov Compare Post Acute Care list provided to:: Patient Choice offered to / list presented to : Patient  Discharge Placement                Patient chooses bed at: [facility] [place] Patient to be transferred to facility by: [facility]   Patient and family notified of of transfer: 07/15/22  Discharge Plan and Services Additional resources added to the After Visit Summary for       Post Acute Care Choice: Resumption of Svcs/PTA Provider                               Social Determinants of Health (SDOH) Interventions SDOH Screenings   Food Insecurity: No Food Insecurity (07/10/2022)  Housing: Low Risk  (07/10/2022)  Transportation Needs: No Transportation Needs (07/10/2022)  Utilities: Not At Risk (07/10/2022)  Tobacco Use: Low Risk  (07/10/2022)     Readmission Risk Interventions    06/14/2022   12:43 PM 03/07/2022    4:12 PM  Readmission Risk Prevention Plan  Transportation Screening Complete Complete  PCP or Specialist Appt within 5-7 Days  Complete  Home Care Screening  Complete  Medication Review (RN CM)  Complete  Medication Review (RN Care Manager) Complete   PCP or  Specialist appointment within 3-5 days of discharge Complete   SW Recovery Care/Counseling Consult Complete   Skilled Nursing Facility Complete

## 2022-07-15 NOTE — TOC Progression Note (Signed)
Transition of Care [facility]) - Progression Note    Patient Details  Name: Riann Oman MRN: [id number] Date of Birth: 04-24-1956  Transition of Care [facility]) CM/SW [place], [place] Phone Number: 07/15/2022, 8:55 AM  Clinical Narrative:     CSW spoke with patient regarding insurance auth denial, plans to return to [facility] with private pay as she reports she has previously been doing. Patient's spouse updated as well. Pending medical readiness to dc back to [facility].   Expected Discharge Plan: [place] Barriers to Discharge: Ship broker, Continued Medical Work up  Expected Discharge Plan and Services     Post Acute Care Choice: Resumption of Svcs/PTA Provider Living arrangements for the past 2 months: [place], Single Family Home                                       Social Determinants of Health (SDOH) Interventions [place]: No Food Insecurity (07/10/2022)  Housing: Low Risk  (07/10/2022)  Transportation Needs: No Transportation Needs (07/10/2022)  Utilities: Not At Risk (07/10/2022)  Tobacco Use: Low Risk  (07/10/2022)    Readmission Risk Interventions    06/23/2022   12:43 PM 03/07/2022    4:12 PM  Readmission Risk Prevention Plan  Transportation Screening Complete Complete  PCP or Specialist Appt within 5-7 Days  Complete  Home Care Screening  Complete  Medication Review (RN CM)  Complete  Medication Review (RN Care Manager) Complete   PCP or Specialist appointment within 3-5 days of discharge Complete   SW Recovery Care/Counseling Consult Complete   Skilled Nursing Facility Complete

## 2022-07-15 NOTE — Progress Notes (Incomplete)
PROGRESS NOTE  Madison Mcbride FUX:[id number] DOB: Apr 12, 1956   PCP: Rusty Aus, MD  Patient is from: SNF  DOA: 06/22/2022 LOS: 6  Chief complaints Chief Complaint  Patient presents with   Tachycardia     Brief Narrative / Interim history: 67 year old F with PMH of A-fib on Eliquis, diastolic CHF, DM-2, HTN, HLD, hypothyroidism, thyroid cancer, anxiety, depression, morbid obesity, chronic back pain, OSA on CPAP and CKD-3A sent to ED with tachycardia with HR in 130 send admitted for sepsis due to urinary tract infection.  In ED, she was tachycardic to 120s.  WBC 12.6.  UA concerning for UTI.  COVID-19, influenza and RSV PCR nonreactive.  CXR with cardiomegaly without infiltration or edema.  Cultures obtained.  Patient was started on IV meropenem due to history of ESBL, and IV Cardizem, admitted for further care.  Patient transitioned off IV Cardizem to p.o. metoprolol.  Apparently, urine culture canceled.  Continue IV meropenem, will complete course today (1/2).    PT/OT recommend return to SNF for rehab.  Pt remains profoundly weak.  SNF placement pending.  Insurance now Surveyor, quantity.   Subjective: Pt reports feel well overall.  No fever/chills, dysuria, SOB, chst pain, cough, congestion, abdominal pain N/V/D.  Remains very weak and seems motivated for progress at rehab.    Objective: Vitals:   07/15/22 0422 07/15/22 0423 07/15/22 0536 07/15/22 0739  BP: (!) 97/59 109/67 122/82 109/79  Pulse: (!) 102  94 (!) 106  Resp: (!) '24  20 18  '$ Temp: (!) 97.4 F (36.3 C) (!) 97.4 F (36.3 C) (!) 97.5 F (36.4 C) 97.6 F (36.4 C)  TempSrc:  Oral Oral   SpO2: 100% 98% 98% 100%  Weight:      Height:        Examination:  General exam: awake, alert, no acute distress, obese HEENT: moist mucus membranes, hearing grossly normal  Respiratory system: CTAB diminished bases, no wheezes, rales or rhonchi, normal respiratory effort. Cardiovascular system: normal S1/S2, RRR,  no  pedal edema.   Gastrointestinal system: soft, NT, ND, no HSM felt, +bowel sounds. Central nervous system: A&O x. no gross focal neurologic deficits, normal speech Extremities: moves all, chronic BLE venous stasis hyperpigmentation, normal tone Skin: dry, intact, normal temperature Psychiatry: normal mood, congruent affect, judgement and insight appear normal   Procedures:  None  Microbiology summarized: COVID-19, influenza and RSV PCR nonreactive Blood cultures NGTD Urine culture canceled by interface lab.  Assessment and plan: Principal Problem:   UTI (urinary tract infection) Active Problems:   Sepsis ([place])   Myocardial injury   Atrial fibrillation with RVR (HCC)   Chronic systolic CHF (congestive heart failure) (HCC)   HTN (hypertension)   Type II diabetes mellitus with renal manifestations (HCC)   HLD (hyperlipidemia)   Iron deficiency anemia   Chronic kidney disease, stage 3a ([place])   Hypothyroidism   Depression with anxiety   Morbid obesity with BMI of 50.0-59.9, adult ([place])  Sepsis due to urinary tract infection: POA.  Had tachycardia, tachypnea, leukocytosis with UTI.  Lactic acid within normal.  She reports some dysuria and pressure sensation with urination.  No fever.  Pro-Cal elevated to 0.31.  Blood cultures NGTD.  History of ESBL.  Unfortunately, her urine culture was canceled.  -Will finish 5 day course of meropenem today  Leukocytosis - had improvedt to 11's, but increased yesterday to WBC 13.9, today 13.6. Initial urine culture was cancelled and not run. Repeat urine culture ordered 1/1 -  no growth Finishing antibiotics today Monitor CBC and for s/sx's of infection.  Elevated troponin/demand ischemia/type II MI: Troponin level 38 --> 36 likely demand ischemia from the above and delayed clearance from CKD. -Continue home medications.   Chronic systolic CHF: TTE on 8/33/3832 with LVEF of 45 to 50% and mild to moderate MVR, TVR and moderate AVR.  Appears  euvolemic on exam.  BNP elevated to 1300, slightly higher than baseline. On p.o. Lasix and metolazone at home.  No cardiopulmonary symptoms.  Appears euvolemic on exam. -Resume home Lasix at 40 mg twice daily -Closely monitor fluid and respiratory status     Atrial fibrillation with RVR: Seems to be in sinus rhythm with intermittent tachycardia.  Started on Cardizem drip in ED.  Seems she is on Inderal and Eliquis at home. -Increase metoprolol to 50 mg twice daily.   -Discontinue Inderal on discharge. -Continue Eliquis for anticoagulation.   Essential HTN: Normotensive. -Meds as above.   Controlled NIDDM-2 with HLD and CKD-3A: A1c 5.3%.  CBG in 80s consistently. -Discontinue SSI and CBG monitoring -Continue Lipitor.   Iron deficiency anemia: H&H stable. -Monitor as needed  CKD-3A: stable. -Monitor intermittently as needed  Hypothyroidism: TSH slightly elevated -Continue home Synthroid. -Check TSH in 4 to 6 weeks   Depression with anxiety: Stable -Continue home medications  Generalized weakness/physical deconditioning: Patient is from SNF. -PT/OT recommended to return to SNF   Morbid obesity with BMI of 50.0-59.9, adult ([place]) Body mass index is 50.04 kg/m.          DVT prophylaxis:   apixaban (ELIQUIS) tablet 5 mg  Code Status: Full code Family Communication: None at bedside Level of care: Telemetry Cardiac Status is: Inpatient  Remains inpatient appropriate because: Unsafe discharge. Due to profound weakness, requires SNF/rehab placement.   Final disposition: SNF - insurance Josem Kaufmann is pending.  Peer-to-peer requested, will call this afternoon for that.   Consultants:  None  Labs & Data Reviewed:   Notable labs --- WBC increased 11.8 >> 13.9 >> 13.6k today   Repeated urine culture as initial culture on admission was somehow cancelled -- No Growth    Dr. Nicole Kindred, DO [facility]  If 7PM-7AM, please contact  night-coverage [email or web address] 07/15/2022, 8:02 AM

## 2022-07-15 NOTE — Discharge Summary (Addendum)
Physician Discharge Summary  Patient: Madison Mcbride NTZ:[id number] DOB: [birth year]   Code Status: Full Code Admit date: 07/01/2022 Discharge date: 07/15/2022 Disposition: Skilled nursing facility, PT, OT, nurse aid, and RN PCP: Rusty Aus, MD  Recommendations for Outpatient Follow-up:  Follow up with PCP within 1-2 weeks Regarding general hospital follow up and preventative care Recommend metabolic panel and CBC   Discharge Diagnoses:  Principal Problem:   UTI (urinary tract infection) Active Problems:   Sepsis ([place])   Myocardial injury   Atrial fibrillation with RVR ([place])   Chronic systolic CHF (congestive heart failure) (HCC)   HTN (hypertension)   Type II diabetes mellitus with renal manifestations ([place])   HLD (hyperlipidemia)   Iron deficiency anemia   Chronic kidney disease, stage 3a ([place])   Hypothyroidism   Depression with anxiety   Morbid obesity with BMI of 50.0-59.9, adult [facility])  Brief Hospital Course Summary: 67 year old F with PMH of A-fib on Eliquis, diastolic CHF, DM-2, HTN, HLD, hypothyroidism, thyroid cancer, anxiety, depression, morbid obesity, chronic back pain, OSA on CPAP and CKD-3A. She presented from SNF to ED 12/28 with tachycardia with HR in 130s.   In ED, she was tachycardic to 120s.  WBC 12.6.  UA concerning for UTI.  COVID-19, influenza and RSV PCR nonreactive.  CXR with cardiomegaly without infiltration or edema.  Cultures obtained.  Patient was started on IV meropenem due to history of ESBL, and IV Cardizem, admitted for further care. Initially meant sepsis criteria for suspected urinary source.   Due to not having a urine culture collected on presentation, and a h/o ESBL infection, she completed a full course of empiric IV meropenem (completed January 2). She had resolution of sepsis criteria and recovered medically.   IV cardizem was initially started for her tachycardia and was transitioned to PO metoprolol. Her HR was well controlled  in range of 80-100.   Due to continued profound weakness, PT/OT recommend return to SNF for rehab. Insurance authorization was denied which delayed patient's discharge.   All other chronic conditions were treated with home medications.   Discharge Condition: Good, improved Recommended discharge diet: Regular healthy diet  Consultations: None   Procedures/Studies: None   Allergies as of 07/15/2022   No Known Allergies      Medication List     STOP taking these medications    docusate sodium 100 MG capsule Commonly known as: COLACE   linezolid 600 MG tablet Commonly known as: ZYVOX   potassium chloride SA 20 MEQ tablet Commonly known as: KLOR-CON M   propranolol 20 MG tablet Commonly known as: INDERAL       TAKE these medications    acetaminophen 650 MG CR tablet Commonly known as: TYLENOL Take 1,300 mg by mouth every 8 (eight) hours as needed for pain.   ALPRAZolam 0.25 MG tablet Commonly known as: XANAX Take 1 tablet (0.25 mg total) by mouth 3 (three) times daily as needed for anxiety.   apixaban 5 MG Tabs tablet Commonly known as: ELIQUIS Take 1 tablet (5 mg total) by mouth 2 (two) times daily.   atorvastatin 20 MG tablet Commonly known as: LIPITOR Take 20 mg by mouth daily.   Biotin 5 MG Tabs Take 2.5 mg by mouth daily.   buPROPion 150 MG 24 hr tablet Commonly known as: WELLBUTRIN XL Take 150 mg by mouth daily.   Catheter Self-Adhesive Urinary Misc Continue PurWIck prn if available   Cholecalciferol 25 MCG (1000 UT) tablet Take 1  tablet by mouth daily.   cyclobenzaprine 5 MG tablet Commonly known as: FLEXERIL Take 5 mg by mouth 3 (three) times daily as needed for muscle spasms.   furosemide 40 MG tablet Commonly known as: Lasix Take 1 tablet (40 mg total) by mouth 2 (two) times daily. Increase to 1 tablet (40 mg total) by mouth THREE TIMES daily (total daily dose 120 mg) as needed for up to 3 days for increased leg swelling, shortness of  breath, weight gain 5+ lbs over 1-2 days. Seek medical care if these symptoms are not improving with increased dose. Reduce dose to 1 tablet (40 mg total) by mouth ONCE DAILY if dizziness or low blood pressure on bid dose.   gabapentin 100 MG capsule Commonly known as: NEURONTIN Take 2 capsules (200 mg total) by mouth 3 (three) times daily.   Iron (Ferrous Sulfate) 325 (65 Fe) MG Tabs Take 325 mg by mouth daily at 12 noon.   levothyroxine 125 MCG tablet Commonly known as: SYNTHROID Take 2 tablets (250 mcg total) by mouth daily. Takes two tablets for a total of 250 mcg daily.   metolazone 2.5 MG tablet Commonly known as: ZAROXOLYN Take 2.5 mg by mouth daily as needed (if weight up > 5 pounds).   metoprolol tartrate 50 MG tablet Commonly known as: LOPRESSOR Take 1 tablet (50 mg total) by mouth 2 (two) times daily.   mouth rinse Liqd solution 15 mLs by Mouth Rinse route as needed (oral care).   multivitamin tablet Take 1 tablet by mouth daily.   naloxone 4 MG/0.1ML Liqd nasal spray kit Commonly known as: NARCAN Place 1 spray into the nose once.   pantoprazole 40 MG tablet Commonly known as: PROTONIX Take 40 mg by mouth daily.   polyethylene glycol 17 g packet Commonly known as: MIRALAX / GLYCOLAX Take 17 g by mouth daily as needed for moderate constipation.   potassium chloride 20 MEQ packet Commonly known as: KLOR-CON Take 20 mEq by mouth daily.   rOPINIRole 3 MG tablet Commonly known as: REQUIP Take 1 tablet (3 mg total) by mouth at bedtime.   Semaglutide (1 MG/DOSE) 2 MG/1.5ML Sopn Inject 0.75 mLs into the skin once a week.   traMADol 50 MG tablet Commonly known as: ULTRAM Take 50 mg by mouth every 6 (six) hours as needed for moderate pain.   zinc gluconate 50 MG tablet Take 50 mg by mouth daily.        Subjective   Pt reports significant weakness in general. She is agreeable to discharge to SNF for rehab but would prefer to go home where she lives with  her husband if able and as soon as able. He works full time.  She denies chest pain, SOB, LE swelling.   All questions and concerns were addressed at time of discharge.  Objective  Blood pressure 109/79, pulse (!) 106, temperature 97.6 F (36.4 C), resp. rate 18, height _0  (1.626 m), weight 132.2 kg, SpO2 100 %.   General: Pt is alert, awake, not in acute distress Cardiovascular: RRR, S1/S2 +, no rubs, no gallops Respiratory: CTA bilaterally, no wheezing, no rhonchi Abdominal: Soft, NT, ND, bowel sounds + Extremities: no edema, no cyanosis  The results of significant diagnostics from this hospitalization (including imaging, microbiology, ancillary and laboratory) are listed below for reference.   Imaging studies: DG Chest Port 1 View  Result Date: 07/08/2022 CLINICAL DATA:  Tachycardia, possible sepsis EXAM: PORTABLE CHEST 1 VIEW COMPARISON:  05/27/2022 FINDINGS: Mild  to moderate enlargement of the cardiopericardial silhouette. Mildly improved retrocardiac aeration compared to previous. There is likely some linear subsegmental atelectasis in both lower lobes. Mild indistinctness of the pulmonary vasculature suggesting pulmonary venous hypertension. No compelling findings of pneumonia. Thoracic spondylosis noted. IMPRESSION: 1. Mild to moderate enlargement of the cardiopericardial silhouette with pulmonary venous hypertension but no overt edema. 2. Mild subsegmental atelectasis in both lower lobes. 3. Thoracic spondylosis. 4. Mildly improved retrocardiac aeration compared to previous. Electronically Signed   By: Van Clines M.D.   On: 07/08/2022 20:12    Labs: Basic Metabolic Panel: Recent Labs  Lab 07/08/22 1924 07/10/22 0443 07/11/22 0547 07/13/22 0519  NA 141 138 141 141  K 4.2 3.6 3.6 3.6  CL 97* 96* 99 96*  CO2 33* 29 35* 35*  GLUCOSE 96 95 129* 98  BUN 47* 48* 43* 31*  CREATININE 1.39* 1.20* 1.13* 0.90  CALCIUM 8.6* 8.9 9.0 8.5*  MG  --   --  2.1  --   PHOS  --    --  3.0  --    CBC: Recent Labs  Lab 07/08/22 1924 07/10/22 0443 07/11/22 0547 07/13/22 0519 07/14/22 0612  WBC 12.6* 11.1* 11.8* 13.9* 13.6*  NEUTROABS 10.3*  --   --   --   --   HGB 11.1* 10.2* 10.3* 10.6* 10.7*  HCT 39.4 35.6* 35.5* 37.3 38.4  MCV 88.3 87.5 86.6 89.2 89.7  PLT 313 286 279 311 337   Microbiology: Results for orders placed or performed during the hospital encounter of 07/03/2022  Culture, blood (Routine x 2)     Status: None   Collection Time: 07/08/22  7:39 PM   Specimen: BLOOD  Result Value Ref Range Status   Specimen Description BLOOD BLOOD RIGHT ARM  Final   Special Requests   Final    BOTTLES DRAWN AEROBIC AND ANAEROBIC Blood Culture adequate volume   Culture   Final    NO GROWTH 5 DAYS Performed at [facility], [address] [place], [place] [address]    Report Status 07/13/2022 FINAL  Final  Culture, blood (Routine x 2)     Status: None   Collection Time: 07/08/22  7:47 PM   Specimen: BLOOD  Result Value Ref Range Status   Specimen Description BLOOD BLOOD LEFT ARM  Final   Special Requests   Final    BOTTLES DRAWN AEROBIC AND ANAEROBIC Blood Culture adequate volume   Culture   Final    NO GROWTH 5 DAYS Performed at [facility], [address] [place], [place] [address]    Report Status 07/13/2022 FINAL  Final  Resp panel by RT-PCR (RSV, Flu A&B, Covid) Anterior Nasal Swab     Status: None   Collection Time: 07/08/22  7:47 PM   Specimen: Anterior Nasal Swab  Result Value Ref Range Status   SARS Coronavirus 2 by RT PCR NEGATIVE NEGATIVE Final    Comment: (NOTE) SARS-CoV-2 target nucleic acids are NOT DETECTED.  The SARS-CoV-2 RNA is generally detectable in upper respiratory specimens during the acute phase of infection. The lowest concentration of SARS-CoV-2 viral copies this assay can detect is 138 copies/mL. A negative result does not preclude SARS-Cov-2 infection and should not be used as the sole basis for  treatment or other patient management decisions. A negative result may occur with  improper specimen collection/handling, submission of specimen other than nasopharyngeal swab, presence of viral mutation(s) within the areas targeted by this assay, and  inadequate number of viral copies(<138 copies/mL). A negative result must be combined with clinical observations, patient history, and epidemiological information. The expected result is Negative.  Fact Sheet for Patients:  EntrepreneurPulse.com.au  Fact Sheet for Healthcare Providers:  IncredibleEmployment.be  This test is no t yet approved or cleared by the [place] FDA and  has been authorized for detection and/or diagnosis of SARS-CoV-2 by FDA under an Emergency Use Authorization (EUA). This EUA will remain  in effect (meaning this test can be used) for the duration of the COVID-19 declaration under Section 564(b)(1) of the Act, 21 U.S.C.section 360bbb-3(b)(1), unless the authorization is terminated  or revoked sooner.       Influenza A by PCR NEGATIVE NEGATIVE Final   Influenza B by PCR NEGATIVE NEGATIVE Final    Comment: (NOTE) The Xpert Xpress SARS-CoV-2/FLU/RSV plus assay is intended as an aid in the diagnosis of influenza from Nasopharyngeal swab specimens and should not be used as a sole basis for treatment. Nasal washings and aspirates are unacceptable for Xpert Xpress SARS-CoV-2/FLU/RSV testing.  Fact Sheet for Patients: EntrepreneurPulse.com.au  Fact Sheet for Healthcare Providers: IncredibleEmployment.be  This test is not yet approved or cleared by the [place] FDA and has been authorized for detection and/or diagnosis of SARS-CoV-2 by FDA under an Emergency Use Authorization (EUA). This EUA will remain in effect (meaning this test can be used) for the duration of the COVID-19 declaration under Section 564(b)(1) of the Act, 21  U.S.C. section 360bbb-3(b)(1), unless the authorization is terminated or revoked.     Resp Syncytial Virus by PCR NEGATIVE NEGATIVE Final    Comment: (NOTE) Fact Sheet for Patients: EntrepreneurPulse.com.au  Fact Sheet for Healthcare Providers: IncredibleEmployment.be  This test is not yet approved or cleared by the [place] FDA and has been authorized for detection and/or diagnosis of SARS-CoV-2 by FDA under an Emergency Use Authorization (EUA). This EUA will remain in effect (meaning this test can be used) for the duration of the COVID-19 declaration under Section 564(b)(1) of the Act, 21 U.S.C. section 360bbb-3(b)(1), unless the authorization is terminated or revoked.  Performed at [facility], [address], [place], [place] [address]   Urine Culture     Status: None   Collection Time: 07/13/22  9:00 AM   Specimen: Urine, Clean Catch  Result Value Ref Range Status   Specimen Description   Final    URINE, CLEAN CATCH Performed at [facility], [address], [place], [place] [address]    Special Requests   Final    NONE Performed at [facility], [address], [place], [place] [address]    Culture   Final    NO GROWTH Performed at [facility], [place] [address] [place], [place] [address]    Report Status 07/14/2022 FINAL  Final   Time coordinating discharge: Over 30 minutes  Richarda Osmond, MD  [facility] 07/15/2022, 11:27 AM

## 2022-07-15 NOTE — Care Management Important Message (Signed)
Important Message  Patient Details  Name: Madison Mcbride MRN: [id number] Date of Birth: 06/07/1956   Medicare Important Message Given:  Yes  Reviewed Medicare IM with patient via room phone [phone]).  Copy of Medicare IM sent securely to patient at email address on [email or web address].    Dannette Barbara 07/15/2022, 11:40 AM

## 2022-07-15 NOTE — Progress Notes (Signed)
       CROSS COVER NOTE  NAME: Madison Mcbride MRN: [id number] DOB : [birth year]   HPI/Events of Note   Nurse reports husband of patient refused for patient to leave to go to snf when transport due to elevated heart rate ranging 130 - 150  Assessment and  Interventions   Assessment:  Plan: Give previously ordered metoprolol Check K and Mg supplement ofr K above 4 and mag above 2       Kathlene Cote NP [facility]

## 2022-07-16 ENCOUNTER — Inpatient Hospital Stay: Payer: Medicare Other

## 2022-07-16 ENCOUNTER — Other Ambulatory Visit: Payer: Self-pay

## 2022-07-16 DIAGNOSIS — I480 Paroxysmal atrial fibrillation: Secondary | ICD-10-CM

## 2022-07-16 LAB — COMPREHENSIVE METABOLIC PANEL
ALT: 13 U/L (ref 0–44)
AST: 32 U/L (ref 15–41)
Albumin: 3.2 g/dL — ABNORMAL LOW (ref 3.5–5.0)
Alkaline Phosphatase: 108 U/L (ref 38–126)
Anion gap: 13 (ref 5–15)
BUN: 48 mg/dL — ABNORMAL HIGH (ref 8–23)
CO2: 33 mmol/L — ABNORMAL HIGH (ref 22–32)
Calcium: 8.5 mg/dL — ABNORMAL LOW (ref 8.9–10.3)
Chloride: 93 mmol/L — ABNORMAL LOW (ref 98–111)
Creatinine, Ser: 1.35 mg/dL — ABNORMAL HIGH (ref 0.44–1.00)
GFR, Estimated: 43 mL/min — ABNORMAL LOW (ref >60)
Glucose, Bld: 110 mg/dL — ABNORMAL HIGH (ref 70–99)
Potassium: 4.8 mmol/L (ref 3.5–5.1)
Sodium: 139 mmol/L (ref 135–145)
Total Bilirubin: 1.7 mg/dL — ABNORMAL HIGH (ref 0.3–1.2)
Total Protein: 7.3 g/dL (ref 6.5–8.1)

## 2022-07-16 LAB — BLOOD GAS, VENOUS
Acid-Base Excess: 9.2 mmol/L — ABNORMAL HIGH (ref 0.0–2.0)
Bicarbonate: 36.7 mmol/L — ABNORMAL HIGH (ref 20.0–28.0)
O2 Saturation: 60.4 %
Patient temperature: 37
pCO2, Ven: 62 mmHg — ABNORMAL HIGH (ref 44–60)
pH, Ven: 7.38 (ref 7.25–7.43)
pO2, Ven: 44 mmHg (ref 32–45)

## 2022-07-16 LAB — CBC
HCT: 38 % (ref 36.0–46.0)
Hemoglobin: 10.8 g/dL — ABNORMAL LOW (ref 12.0–15.0)
MCH: 25.5 pg — ABNORMAL LOW (ref 26.0–34.0)
MCHC: 28.4 g/dL — ABNORMAL LOW (ref 30.0–36.0)
MCV: 89.8 fL (ref 80.0–100.0)
Platelets: 386 10*3/uL (ref 150–400)
RBC: 4.23 MIL/uL (ref 3.87–5.11)
RDW: 24.1 % — ABNORMAL HIGH (ref 11.5–15.5)
WBC: 13.1 10*3/uL — ABNORMAL HIGH (ref 4.0–10.5)
nRBC: 1.1 % — ABNORMAL HIGH (ref 0.0–0.2)

## 2022-07-16 LAB — GLUCOSE, CAPILLARY
Glucose-Capillary: 102 mg/dL — ABNORMAL HIGH (ref 70–99)
Glucose-Capillary: 136 mg/dL — ABNORMAL HIGH (ref 70–99)

## 2022-07-16 LAB — TROPONIN I (HIGH SENSITIVITY): Troponin I (High Sensitivity): 28 ng/L — ABNORMAL HIGH (ref <18)

## 2022-07-16 MED ORDER — METOPROLOL TARTRATE 50 MG PO TABS
100.0000 mg | ORAL_TABLET | Freq: Two times a day (BID) | ORAL | Status: DC
  Administered 2022-07-16: 100 mg via ORAL
  Filled 2022-07-16 (×2): qty 2

## 2022-07-16 MED ORDER — DILTIAZEM HCL 30 MG PO TABS
60.0000 mg | ORAL_TABLET | Freq: Three times a day (TID) | ORAL | Status: DC
  Administered 2022-07-16 – 2022-07-17 (×3): 60 mg via ORAL
  Filled 2022-07-16 (×3): qty 2

## 2022-07-16 MED ORDER — METOPROLOL TARTRATE 50 MG PO TABS
75.0000 mg | ORAL_TABLET | Freq: Two times a day (BID) | ORAL | Status: DC
  Administered 2022-07-16 – 2022-07-17 (×2): 75 mg via ORAL
  Filled 2022-07-16 (×2): qty 1

## 2022-07-16 MED ORDER — METOPROLOL TARTRATE 5 MG/5ML IV SOLN
5.0000 mg | Freq: Once | INTRAVENOUS | Status: AC
  Administered 2022-07-16: 5 mg via INTRAVENOUS
  Filled 2022-07-16: qty 5

## 2022-07-16 NOTE — Consult Note (Signed)
[facility] NOTE       Patient ID: Madison Mcbride MRN: [id number] DOB/AGE: [birth year] 67 y.o.  Admit date: 07/12/2022 Referring Physician Dr. Doristine Mango Primary Physician Dr. Emily Filbert Primary Cardiologist prev Dr. Nehemiah Massed Reason for Consultation tachycardia  HPI: Jezebel Pollet. 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 She initially met sepsis criteria, started on IV meropenem for treatment of cystitis due to history of ESBL and IV diltiazem for AF RVR.  Discharge back to SNF was planned to the afternoon of 07/15/2021, but the patient was tachycardic, and the SNF refused to accept her.  Cardiology is consulted for further assistance with her atrial fibrillation.  Patient presents with her husband who contributes to the history, recall the history above where her skilled nursing facility was concerned that her heart rate was in the 140s, she was found to have urinary tract infection on admission with leukocytosis to 12,000, and was initially in A-fib with RVR requiring a diltiazem infusion earlier during her hospitalization.  She completed a course of antibiotics for cystitis, and heart rate was controlled in the 90s to low 100s in atrial fibrillation throughout her hospitalization.  For an unknown reason her morning dose of metoprolol tartrate 50 mg was held on 1/3, and later in the evening when discharge was planned, her heart rate was elevated to the 130s in atrial fibrillation, refractory to her p.o. metoprolol.  At my time of evaluation, the patient is sitting upright in the recliner with her husband at bedside, she says she can feel her heart racing  a little bit, but denies chest pain, shortness of breath, orthopnea, or peripheral edema.  Her husband expresses concern regarding periods of the patient "breaking out in a sweat" seemingly out of the blue, and sometimes feeling cold even though the temperature in the room is turned up, apart from her instances of fast heart rates, that have been occurring for the past month or so.  The patient does not seem to be concerned with this.  Recent vitals are notable for a blood pressure of 114/72, heart rate ranged from the 110s to 140s in atrial fibrillation on telemetry, she is saturating well on 2.5 L by nasal cannula.  Labs are notable for a potassium 4.5, magnesium 2.3, BUN/creatinine 41/1.11 and GFR 54, CBC from 1/2 reveals WBCs of 13,600, and H&H of 10.7/30.4, 337,000 platelets.  Review of systems complete and found to be negative unless listed above     Past Medical History:  Diagnosis Date   CHF (congestive heart failure) ([place])    Diabetes mellitus without complication ([place])    Hypothyroidism    Lymphedema    RLS (restless legs syndrome)    Sleep apnea    Thyroid cancer ([place]) 2007    Past Surgical History:  Procedure Laterality Date   ABDOMINAL HYSTERECTOMY     COLONOSCOPY WITH PROPOFOL N/A 08/26/2015   Procedure: COLONOSCOPY WITH PROPOFOL;  Surgeon: Manya Silvas, MD;  Location: [facility];  Service: Endoscopy;  Laterality: N/A;   THYROIDECTOMY      Medications Prior to Admission  Medication Sig Dispense Refill Last Dose   apixaban (ELIQUIS) 5 MG TABS tablet  Take 1 tablet (5 mg total) by mouth 2 (two) times daily. 60 tablet 0 07/08/2022 at 1800   atorvastatin (LIPITOR) 20 MG tablet Take 20 mg by mouth daily.   07/08/2022 at 1700   Biotin 5 MG TABS Take 2.5 mg by mouth daily.   07/08/2022 at 0900   buPROPion (WELLBUTRIN XL) 150 MG 24 hr tablet Take 150 mg by mouth daily.   07/08/2022 at 0900   Cholecalciferol 25 MCG (1000 UT) tablet Take 1 tablet by mouth daily.   07/08/2022  at 0900   cyclobenzaprine (FLEXERIL) 5 MG tablet Take 5 mg by mouth 3 (three) times daily as needed for muscle spasms.   prn   furosemide (LASIX) 40 MG tablet Take 1 tablet (40 mg total) by mouth 2 (two) times daily. Increase to 1 tablet (40 mg total) by mouth THREE TIMES daily (total daily dose 120 mg) as needed for up to 3 days for increased leg swelling, shortness of breath, weight gain 5+ lbs over 1-2 days. Seek medical care if these symptoms are not improving with increased dose. Reduce dose to 1 tablet (40 mg total) by mouth ONCE DAILY if dizziness or low blood pressure on bid dose. 90 tablet 0 07/08/2022 at 1800   gabapentin (NEURONTIN) 100 MG capsule Take 2 capsules (200 mg total) by mouth 3 (three) times daily.   07/08/2022 at 1700   linezolid (ZYVOX) 600 MG tablet Take 600 mg by mouth 2 (two) times daily.   07/06/2022 at 0900   Multiple Vitamin (MULTIVITAMIN) tablet Take 1 tablet by mouth daily.   07/08/2022 at 0900   naloxone (NARCAN) nasal spray 4 mg/0.1 mL Place 1 spray into the nose once.   prn   pantoprazole (PROTONIX) 40 MG tablet Take 40 mg by mouth daily.   07/08/2022 at 0630   potassium chloride (KLOR-CON) 20 MEQ packet Take 20 mEq by mouth daily.   07/08/2022 at 0900   propranolol (INDERAL) 20 MG tablet Take 1 tablet (20 mg total) by mouth 2 (two) times daily. (Patient taking differently: Take 20-40 mg by mouth 2 (two) times daily.)   07/08/2022 at 1800   rOPINIRole (REQUIP) 3 MG tablet Take 1 tablet (3 mg total) by mouth at bedtime. 30 tablet 0 07/08/2022 at 2000   traMADol (ULTRAM) 50 MG tablet Take 50 mg by mouth every 6 (six) hours as needed for moderate pain.   prn   zinc gluconate 50 MG tablet Take 50 mg by mouth daily.   07/08/2022 at 0900   acetaminophen (TYLENOL) 650 MG CR tablet Take 1,300 mg by mouth every 8 (eight) hours as needed for pain.   prn   ALPRAZolam (XANAX) 0.25 MG tablet Take 1 tablet (0.25 mg total) by mouth 3 (three) times daily as needed for anxiety. 6  tablet 0 prn   Catheter Self-Adhesive Urinary MISC Continue PurWIck prn if available 1 each 0    docusate sodium (COLACE) 100 MG capsule Take 1 capsule (100 mg total) by mouth 2 (two) times daily as needed for mild constipation. 10 capsule 0 prn   Iron, Ferrous Sulfate, 325 (65 Fe) MG TABS Take 325 mg by mouth daily at 12 noon. (Patient not taking: Reported on 06/12/2022) 30 tablet  Not Taking   metolazone (ZAROXOLYN) 2.5 MG tablet Take 2.5 mg by mouth daily as needed (if weight up > 5 pounds).   prn   Mouthwashes (MOUTH RINSE) LIQD solution 15 mLs by Mouth Rinse route as needed (oral care).  0 prn   polyethylene glycol (MIRALAX / GLYCOLAX) 17 g packet Take 17 g by mouth daily as needed for moderate constipation. 14 each 0 prn   potassium chloride SA (KLOR-CON M) 20 MEQ tablet Take 20 mEq by mouth daily. (Patient not taking: Reported on 07/12/2022)   Not Taking   Semaglutide, 1 MG/DOSE, 2 MG/1.5ML SOPN Inject 0.75 mLs into the skin once a week.   07/05/2022 at 2000   [DISCONTINUED] levothyroxine (SYNTHROID) 125 MCG tablet Take 1 tablet (125 mcg total) by mouth daily. (Patient taking differently: Take 250 mcg by mouth daily. Takes two tablets for a total of 250 mcg daily.) 30 tablet 0    Social History   Socioeconomic History   Marital status: Married    Spouse name: Not on file   Number of children: Not on file   Years of education: Not on file   Highest education level: Not on file  Occupational History   Not on file  Tobacco Use   Smoking status: Never   Smokeless tobacco: Never  Substance and Sexual Activity   Alcohol use: No   Drug use: No   Sexual activity: Not on file  Other Topics Concern   Not on file  Social History Narrative   Not on file   Social Determinants of Health   Financial Resource Strain: Not on file  Food Insecurity: No Food Insecurity (07/10/2022)   Hunger Vital Sign    Worried About Running Out of Food in the Last Year: Never true    Ran Out of Food in  the Last Year: Never true  Transportation Needs: No Transportation Needs (07/10/2022)   PRAPARE - Hydrologist (Medical): No    Lack of Transportation (Non-Medical): No  Physical Activity: Not on file  Stress: Not on file  Social Connections: Not on file  Intimate Partner Violence: Not At Risk (07/10/2022)   Humiliation, Afraid, Rape, and Kick questionnaire    Fear of Current or Ex-Partner: No    Emotionally Abused: No    Physically Abused: No    Sexually Abused: No    Family History  Problem Relation Age of Onset   Autoimmune disease Sister    Breast cancer Neg Hx       Intake/Output Summary (Last 24 hours) at 07/16/2022 0955 Last data filed at 07/16/2022 0738 Gross per 24 hour  Intake 600 ml  Output 2252 ml  Net -1652 ml    Vitals:   07/15/22 2224 07/16/22 0015 07/16/22 0323 07/16/22 0738  BP: 118/86 (!) 105/93 113/80 114/72  Pulse: (!) 110 (!) 113 (!) 118 (!) 129  Resp: _0 Temp: 97.6 F (36.4 C) 97.7 F (36.5 C) 98.1 F (36.7 C) 98 F (36.7 C)  TempSrc: Axillary Oral Oral   SpO2: 96% 100% 94% 99%  Weight:  134.5 kg    Height:        PHYSICAL EXAM General: caucasian female, well nourished, in no acute distress.  Sitting upright in recliner with husband at bedside HEENT:  Normocephalic and atraumatic. Neck:  No JVD.  Lungs: Normal respiratory effort on 2.5 L by [place].  Decreased bibasilar breath sounds without appreciable crackles.   Heart: Tachycardic irregularly irregular rate and rhythm. Normal S1 and S2 without gallops or murmurs.  Abdomen: Non-distended appearing with excess adiposity.  Msk: Normal strength and tone for age. Extremities: Chronic appearing venous stasis hyperpigmentation without peripheral edema.  Cap refill brisk bilaterally Neuro:  Alert and oriented X 3. Psych:  Answers questions appropriately.   Labs: Basic Metabolic Panel: Recent Labs    07/15/22 2316  NA 141  K 4.5  CL 98  CO2 34*  GLUCOSE  120*  BUN 41*  CREATININE 1.11*  CALCIUM 8.7*  MG 2.3   Liver Function Tests: No results for input(s): "AST", "ALT", "ALKPHOS", "BILITOT", "PROT", "ALBUMIN" in the last 72 hours. No results for input(s): "LIPASE", "AMYLASE" in the last 72 hours. CBC: Recent Labs    07/14/22 0612  WBC 13.6*  HGB 10.7*  HCT 38.4  MCV 89.7  PLT 337   Cardiac Enzymes: No results for input(s): "CKTOTAL", "CKMB", "CKMBINDEX", "TROPONINIHS" in the last 72 hours. BNP: No results for input(s): "BNP" in the last 72 hours. D-Dimer: No results for input(s): "DDIMER" in the last 72 hours. Hemoglobin A1C: No results for input(s): "HGBA1C" in the last 72 hours. Fasting Lipid Panel: No results for input(s): "CHOL", "HDL", "LDLCALC", "TRIG", "CHOLHDL", "LDLDIRECT" in the last 72 hours. Thyroid Function Tests: No results for input(s): "TSH", "T4TOTAL", "T3FREE", "THYROIDAB" in the last 72 hours.  Invalid input(s): "FREET3" Anemia Panel: No results for input(s): "VITAMINB12", "FOLATE", "FERRITIN", "TIBC", "IRON", "RETICCTPCT" in the last 72 hours.   Radiology: [facility] Chest Port 1 View  Result Date: 07/08/2022 CLINICAL DATA:  Tachycardia, possible sepsis EXAM: PORTABLE CHEST 1 VIEW COMPARISON:  05/27/2022 FINDINGS: Mild to moderate enlargement of the cardiopericardial silhouette. Mildly improved retrocardiac aeration compared to previous. There is likely some linear subsegmental atelectasis in both lower lobes. Mild indistinctness of the pulmonary vasculature suggesting pulmonary venous hypertension. No compelling findings of pneumonia. Thoracic spondylosis noted. IMPRESSION: 1. Mild to moderate enlargement of the cardiopericardial silhouette with pulmonary venous hypertension but no overt edema. 2. Mild subsegmental atelectasis in both lower lobes. 3. Thoracic spondylosis. 4. Mildly improved retrocardiac aeration compared to previous. Electronically Signed   By: Van Clines M.D.   On: 07/08/2022 20:12     ECHO 03/06/2022 1. Left ventricular ejection fraction, by estimation, is 45 to 50%. The  left ventricle has mildly decreased function. The left ventricle has no  regional wall motion abnormalities. Left ventricular diastolic parameters  were normal.   2. Right ventricular systolic function is normal. The right ventricular  size is normal.   3. The mitral valve is normal in structure. Mild to moderate mitral valve  regurgitation. No evidence of mitral stenosis.   4. Tricuspid valve regurgitation is mild to moderate.   5. The aortic valve is normal in structure. Aortic valve regurgitation is  moderate. No aortic stenosis is present.   6. The inferior vena cava is normal in size with greater than 50%  respiratory variability, suggesting right atrial pressure of 3 mmHg.   TELEMETRY reviewed by me (LT) 07/16/2022 : AF RVR rate 110s-140s  EKG reviewed by me: AF RVR rate 123  Data reviewed by me (LT) 07/16/2022: Hospitalist progress note, CBC BMP vitals telemetry Bnp  Principal Problem:   UTI (urinary tract infection) Active Problems:   Hypothyroidism   Morbid obesity with BMI of 50.0-59.9, adult (HCC)   HLD (hyperlipidemia)   Chronic kidney disease, stage 3a (HCC)   Chronic systolic CHF (congestive heart failure) (HCC)   HTN (hypertension)   Type II diabetes mellitus with renal manifestations ([place])   Depression with anxiety   Iron deficiency anemia   Sepsis ([place])   Myocardial injury   Atrial fibrillation with RVR (HCC)   SIRS (systemic inflammatory response syndrome) ([place])  Tachycardia   AKI (acute kidney injury) ([place])    ASSESSMENT AND PLAN:  Demonica Farrey. 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 ED [facility]/29/2022 from a skilled nursing facility out of concern for tachycardia, heart rates on admission were in the 130s, Presented with leukocytosis to 12.6K,  UA concerning for cystitis. She initially met sepsis criteria, started on IV meropenem for treatment of cystitis due to history of ESBL and IV diltiazem for AF RVR.  Discharge back to SNF was planned to the afternoon of 07/15/2021, but the patient was tachycardic, and the SNF refused to accept her.  Cardiology is consulted for further assistance with her atrial fibrillation.  # sepsis 2/2 cystitis -Completed course of antibiotics -Management per primary team  # paroxysmal AF RVR Initially requiring IV diltiazem earlier in admission, was rate controlled on metoprolol tartrate 50 mg twice daily up until the morning dose of this was held on 1/3 for unclear reasons.  Her heart rate increased to the 130s the evening that discharge was planned, refractory to p.o. metoprolol.  This morning she remains in A-fib with RVR with rates ranging from the 110s-140s, the patient is minimally symptomatic from this.  Discussed the rationale for rate vs rhythm control with the patient and her husband, will continue rate control at this time. -Change metoprolol  tartrate to 75 mg twice daily -Add Cardizem 60 mg p.o. every 8 hours -Continue Eliquis 5 mg twice daily for stroke prevention. 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  BNP was elevated approximately 1 week ago at 1290. -Continue GDMT with metoprolol, Lasix 40 mg p.o. twice daily.  Consider addition of ACE/ARB, MRA on an outpatient basis.  Hold off SGLT2 with resolving cystitis.  This patient's plan of care was discussed and created with Dr. Clayborn Bigness and he is in agreement.  Signed: Tristan Schroeder , PA-C 07/16/2022, 9:55 AM [facility]

## 2022-07-16 NOTE — Progress Notes (Signed)
pt was supposed to d/c back to snf this evening. when transport arrived, pt heart rate back up to 130's-140's. other vitals stable at that time. pt had missed a.m. dose of metoprolol so I gave her the evening dose and sent transport off until we could see what the metoprolol does. now pt's heart rate 110's-120's occasionally popping to the 140's but not sustaining. pt husband concerned that her heart rate is back elevated after seeming resolved. would like a cards consult and to not be d/ced this evening.   Notified on call provider. New order given.

## 2022-07-16 NOTE — Progress Notes (Signed)
PROGRESS NOTE  Madison Mcbride    DOB: 08/31/1955, 67 y.o.  OQH:[id number]    Code Status: Full Code   DOA: 07/03/2022   LOS: 7   Brief hospital course  67 year old F with PMH of A-fib on Eliquis, diastolic CHF, DM-2, HTN, HLD, hypothyroidism, thyroid cancer, anxiety, depression, morbid obesity, chronic back pain, OSA on CPAP and CKD-3A. She presented from SNF to ED 12/28 with tachycardia with HR in 130s.   In ED, she was tachycardic to 120s.  WBC 12.6.  UA concerning for UTI.  COVID-19, influenza and RSV PCR nonreactive.  CXR with cardiomegaly without infiltration or edema.  Cultures obtained.  Patient was started on IV meropenem due to history of ESBL, and IV Cardizem, admitted for further care. Initially meant sepsis criteria for suspected urinary source.    Due to not having a urine culture collected on presentation, and a h/o ESBL infection, she completed a full course of empiric IV meropenem (completed January 2). She had resolution of sepsis criteria and recovered medically.    IV cardizem was initially started for her tachycardia and was transitioned to PO metoprolol. Her HR was well controlled in range of 80-100 throughout her stay.    Due to continued profound weakness, PT/OT recommend return to SNF for rehab. Insurance authorization was denied which delayed patient's discharge.  1/3- patient was discharged to SNF. For unknown reason, her morning metoprolol was held on day of discharge which is likely what lead to her HR increase up to 130s on evening of her discharge. This stopped her transfer. She remained hemodynamically stable and asymptomatic. Her rate was controlled with delivery of her evening metoprolol dose.   1/4- HR remains intermittently elevated to 130s. She remains hemodynamically stable and asymptomatic. Cardiology consult recommended increase in metoprolol dose as well as addition of cardizem. Will keep patient overnight for monitoring of stabilization of her HR  with new medications. Plan to dc to SNF tomorrow if remains stable.  Assessment & Plan  Principal Problem:   UTI (urinary tract infection) Active Problems:   Sepsis ([place])   Myocardial injury   Atrial fibrillation with RVR (HCC)   Chronic systolic CHF (congestive heart failure) (HCC)   HTN (hypertension)   Type II diabetes mellitus with renal manifestations (HCC)   HLD (hyperlipidemia)   Iron deficiency anemia   Chronic kidney disease, stage 3a ([place])   Hypothyroidism   Depression with anxiety   Morbid obesity with BMI of 50.0-59.9, adult (HCC)   SIRS (systemic inflammatory response syndrome) (HCC)   Tachycardia   AKI (acute kidney injury) ([place])  Sepsis due to urinary tract infection: POA.  Had tachycardia, tachypnea, leukocytosis with UTI.  Lactic acid within normal.  She reports some dysuria and pressure sensation with urination.  No fever.  Pro-Cal elevated to 0.31.  Blood cultures NGTD.  History of ESBL.  Unfortunately, her urine culture was canceled.  -Completed meropenem 5 day course  Atrial fibrillation with RVR- HR elevated to 130 when she missed metoprolol dose yesterday. Unclear why it was held. Will attempt to rate control prior to dc. Patient is otherwise hemodynamically stable and asymptomatic. - cardiology following, appreciate recs  - continue metoprolol at increased dose  - add cardizem if BP will tolerate -Continue Eliquis for anticoagulation.   Elevated troponin/demand ischemia/type II MI: Troponin level 38 --> 36 likely demand ischemia from the above and delayed clearance from CKD. -Continue home medications.   Chronic systolic CHF: TTE on 5/46/5681 with LVEF of  45 to 50% and mild to moderate MVR, TVR and moderate AVR.  Appears euvolemic on exam.  BNP elevated to 1300, slightly higher than baseline. On p.o. Lasix and metolazone at home.  No cardiopulmonary symptoms.  Appears euvolemic on exam. -Resume home Lasix at 40 mg twice daily -Closely monitor fluid and  respiratory status     Essential HTN: Normotensive. -Meds as above.   Controlled NIDDM-2 with HLD and CKD-3A: A1c 5.3%.  CBG in 80s consistently. -Discontinue SSI and CBG monitoring -Continue Lipitor.   Iron deficiency anemia: H&H stable. -Monitor as needed   CKD-3A: stable. -Monitor intermittently as needed   Hypothyroidism: TSH slightly elevated -Continue home Synthroid. -Check TSH in 4 to 6 weeks   Depression with anxiety: Stable -Continue home medications   Generalized weakness/physical deconditioning: Patient is from SNF. -PT/OT recommended to return to SNF   Morbid obesity with BMI of 50.0-59.9, adult ([place]) Body mass index is 50.98 kg/m.  Body mass index is 50.9 kg/m.  VTE ppx:  apixaban (ELIQUIS) tablet 5 mg   Diet:     Diet   Diet Heart Room service appropriate? Yes; Fluid consistency: Thin   Consultants: Cardiology   Subjective 07/16/22    Pt reports feeling nervous about dying. She asks me multiple times if she is going to die because her heart rate was high. Denies SOB, CP, syncope.  Questions and concerns discussed with her and her husband at bedside at time of encounter.    Objective   Vitals:   07/16/22 0015 07/16/22 0323 07/16/22 0738 07/16/22 1101  BP: (!) 105/93 113/80 114/72 (!) 134/96  Pulse: (!) 113 (!) 118 (!) 129 (!) 125  Resp: '18 17 18 18  '$ Temp: 97.7 F (36.5 C) 98.1 F (36.7 C) 98 F (36.7 C)   TempSrc: Oral Oral    SpO2: 100% 94% 99%   Weight: 134.5 kg     Height:        Intake/Output Summary (Last 24 hours) at 07/16/2022 1314 Last data filed at 07/16/2022 1106 Gross per 24 hour  Intake 600 ml  Output 1452 ml  Net -852 ml   Filed Weights   07/08/22 1912 07/14/22 0820 07/16/22 0015  Weight: 134.7 kg 132.2 kg 134.5 kg    ECG: afib, rate 123  Physical Exam:  General: awake, alert, NAD HEENT: atraumatic, clear conjunctiva, anicteric sclera, MMM, hearing grossly normal Respiratory: normal respiratory effort.  CTAB Cardiovascular: quick capillary refill, normal S1/S2, rapid rate, irregular  Nervous: A&O x3. no gross focal neurologic deficits, normal speech Extremities: moves all equally, no edema, normal tone Skin: dry, intact, normal temperature, normal color. No rashes, lesions or ulcers on exposed skin Psychiatry: anxious mood, congruent affect  Labs   I have personally reviewed the following labs and imaging studies CBC    Component Value Date/Time   WBC 13.6 (H) 07/14/2022 0612   RBC 4.28 07/14/2022 0612   HGB 10.7 (L) 07/14/2022 0612   HGB 12.0 01/18/2014 0617   HCT 38.4 07/14/2022 0612   HCT 36.8 01/18/2014 0617   PLT 337 07/14/2022 0612   PLT 310 01/18/2014 0617   MCV 89.7 07/14/2022 0612   MCV 88 01/18/2014 0617   MCH 25.0 (L) 07/14/2022 0612   MCHC 27.9 (L) 07/14/2022 0612   RDW 23.5 (H) 07/14/2022 0612   RDW 16.2 (H) 01/18/2014 0617   LYMPHSABS 1.6 07/08/2022 1924   LYMPHSABS 1.4 01/18/2014 0617   MONOABS 0.7 07/08/2022 1924   MONOABS 1.0 (H) 01/18/2014  0617   EOSABS 0.3 07/08/2022 1924   EOSABS 0.2 01/18/2014 0617   BASOSABS 0.1 07/08/2022 1924   BASOSABS 0.1 01/18/2014 0617      Latest Ref Rng & Units 07/15/2022   11:16 PM 07/13/2022    5:19 AM 07/11/2022    5:47 AM  BMP  Glucose 70 - 99 mg/dL 120  98  129   BUN 8 - 23 mg/dL 41  31  43   Creatinine 0.44 - 1.00 mg/dL 1.11  0.90  1.13   Sodium 135 - 145 mmol/L 141  141  141   Potassium 3.5 - 5.1 mmol/L 4.5  3.6  3.6   Chloride 98 - 111 mmol/L 98  96  99   CO2 22 - 32 mmol/L 34  35  35   Calcium 8.9 - 10.3 mg/dL 8.7  8.5  9.0    Disposition Plan & Communication  Patient status: Inpatient  Admitted From: SNF Planned disposition location: Skilled nursing facility Anticipated discharge date: 1/5 pending stabilization of HR on new medications  Family Communication: husband at bedside    Author: Richarda Osmond, DO [facility] 07/16/2022, 1:14 PM   Available by Epic secure chat 7AM-7PM. If 7PM-7AM,  please contact night-coverage.  [facility] contact information found on [email or web address].

## 2022-07-16 NOTE — TOC Progression Note (Signed)
Transition of Care [facility]) - Progression Note    Patient Details  Name: Madison Mcbride MRN: [id number] Date of Birth: 09-Dec-1955  Transition of Care [facility]) CM/SW [place], [place] Phone Number: 07/16/2022, 4:10 PM  Clinical Narrative:     Patient's dc yesterday 1/3 was cancelled due to HR issues, [place] updated. Potential dc tomorrow per MD pending medical readiness.   Expected Discharge Plan: [place] Barriers to Discharge: No Barriers Identified  Expected Discharge Plan and Services     Post Acute Care Choice: Resumption of Svcs/PTA Provider Living arrangements for the past 2 months: [place], [place] Expected Discharge Date: 07/15/22                                     Social Determinants of Health ([place]) Interventions [place]: No Food Insecurity (07/10/2022)  Housing: Low Risk  (07/10/2022)  Transportation Needs: No Transportation Needs (07/10/2022)  Utilities: Not At Risk (07/10/2022)  Tobacco Use: Low Risk  (07/10/2022)    Readmission Risk Interventions    06/18/2022   12:43 PM 03/07/2022    4:12 PM  Readmission Risk Prevention Plan  Transportation Screening Complete Complete  PCP or Specialist Appt within 5-7 Days  Complete  Home Care Screening  Complete  Medication Review (RN CM)  Complete  Medication Review (RN Care Manager) Complete   PCP or Specialist appointment within 3-5 days of discharge Complete   SW Recovery Care/Counseling Consult Complete   Skilled Nursing Facility Complete

## 2022-07-16 NOTE — Progress Notes (Signed)
       CROSS COVER NOTE  NAME: Madison Mcbride MRN: [id number] DOB : 22-Jun-1956  HPI/Events of Note   Nurse reports patient complains of shortness of breath and decreased response to diuretic therapy today  Assessment and  Interventions   Assessment: Obvious increased work of breathing but sats stable Urine amber Review of labs with chem panel indicative of alkalois Chest xray obtained- pulmonary edema VBG  Latest Reference Range & Units 07/16/22 22:06  pH, Ven 7.25 - 7.43  7.38  pCO2, Ven 44 - 60 mmHg 62 (H)  pO2, Ven 32 - 45 mmHg 44  Acid-Base Excess 0.0 - 2.0 mmol/L 9.2 (H)  Bicarbonate 20.0 - 28.0 mmol/L 36.7 (H)  O2 Saturation % 60.4  Patient temperature  37.0  Collection site  VEIN  (H): Data is abnormally high Plan: Bipap Diamox 250 x1       Kathlene Cote NP [facility]

## 2022-07-16 NOTE — Progress Notes (Signed)
OT Cancellation Note  Patient Details Name: Madison Mcbride MRN: [id number] DOB: [birth year]   Cancelled Treatment:    Reason Eval/Treat Not Completed: Patient declined, no reason specified. OT continues to follow pt for therapy services. Pt cleared by RN to participate in therapy session this PM. Upon arrival to pt room, pt adamantly declines to participate in therapy services siting fatigue and SOB. Pt educated on importance of functional activity during hospital stay and continues to adamantly decline. Will hold at this time and re-attempt at a later date/time as available and pt medically appropriate for OT services.   Shara Blazing, M.S., OTR/L 07/16/22, 1:03 PM

## 2022-07-16 NOTE — Discharge Summary (Signed)
Physician Discharge Summary  Patient: Madison Mcbride PRF:[id number] DOB: Dec 05, 1955   Code Status: Full Code Admit date: 07/05/2022 Discharge date: 07/18/2022 Disposition: Skilled nursing facility, PT, OT, nurse aid, and RN PCP: Rusty Aus, MD  Recommendations for Outpatient Follow-up:  Follow up with PCP within 1-2 weeks Regarding general hospital follow up and preventative care Recommend metabolic panel and CBC Recommend evaluation for sleep study Follow up with cardiology 1-2 weeks Regarding atrial fibrillation rate control medications changes as below  Discharge Diagnoses:  Principal Problem:   UTI (urinary tract infection) Active Problems:   Sepsis ([place])   Myocardial injury   Atrial fibrillation with RVR ([place])   Chronic systolic CHF (congestive heart failure) (H[facility])   HTN (hypertension)   Type II diabetes mellitus with renal manifestations ([place])   HLD (hyperlipidemia)   Iron deficiency anemia   Chronic kidney disease, stage 3a ([place])   Hypothyroidism   Depression with anxiety   Morbid obesity with BMI of 50.0-59.9, adult (HCC)   SIRS (systemic inflammatory response syndrome) (HCC)   Tachycardia   AKI (acute kidney injury) ([place])   Paroxysmal atrial fibrillation with RVR [facility])  Brief Hospital Course Summary: 67 year old F with PMH of A-fib on Eliquis, diastolic CHF, DM-2, HTN, HLD, hypothyroidism, thyroid cancer, anxiety, depression, morbid obesity, chronic back pain, OSA on CPAP and CKD-3A. She presented from SNF to ED 12/28 with tachycardia with HR in 130s.   In ED, she was tachycardic to 120s.  WBC 12.6.  UA concerning for UTI.  COVID-19, influenza and RSV PCR nonreactive.  CXR with cardiomegaly without infiltration or edema.  Cultures obtained.  Patient was started on IV meropenem due to history of ESBL, and IV Cardizem, admitted for further care. Initially met sepsis criteria for suspected urinary source.   Due to not having a urine culture collected on  presentation, and a h/o ESBL infection, she completed a full course of empiric IV meropenem (completed January 2). She had resolution of sepsis criteria and recovered medically.   IV cardizem was initially started for her tachycardia and was transitioned to PO metoprolol. Her HR was well controlled in range of 80-100.   Due to continued profound weakness, PT/OT recommend return to SNF for rehab. Insurance authorization was denied which delayed patient's discharge.   On day of discharge, patient developed Afib RVR likely in relation to having an unknown missed dose of metoprolol. It was difficult to manage her HR back into regular rate so her metoprolol dose was increased and cardizem was added on by cardiology consult. She remained in Afib but was rate controlled. Throughout her episode of RVR, she remained hemodynamically stable and with mild or no symptoms.   All other chronic conditions were treated with home medications.   Discharge Condition: Good, improved Recommended discharge diet: Regular healthy diet  Consultations: None   Procedures/Studies: None   Allergies as of 07/16/2022   No Known Allergies      Medication List     STOP taking these medications    docusate sodium 100 MG capsule Commonly known as: COLACE   linezolid 600 MG tablet Commonly known as: ZYVOX   potassium chloride SA 20 MEQ tablet Commonly known as: KLOR-CON M   propranolol 20 MG tablet Commonly known as: INDERAL       TAKE these medications    acetaminophen 650 MG CR tablet Commonly known as: TYLENOL Take 1,300 mg by mouth every 8 (eight) hours as needed for pain.   ALPRAZolam  0.25 MG tablet Commonly known as: XANAX Take 1 tablet (0.25 mg total) by mouth 3 (three) times daily as needed for anxiety.   apixaban 5 MG Tabs tablet Commonly known as: ELIQUIS Take 1 tablet (5 mg total) by mouth 2 (two) times daily.   atorvastatin 20 MG tablet Commonly known as: LIPITOR Take 20 mg by mouth  daily.   Biotin 5 MG Tabs Take 2.5 mg by mouth daily.   buPROPion 150 MG 24 hr tablet Commonly known as: WELLBUTRIN XL Take 150 mg by mouth daily.   Catheter Self-Adhesive Urinary Misc Continue PurWIck prn if available   Cholecalciferol 25 MCG (1000 UT) tablet Take 1 tablet by mouth daily.   cyclobenzaprine 5 MG tablet Commonly known as: FLEXERIL Take 5 mg by mouth 3 (three) times daily as needed for muscle spasms.   diltiazem 60 MG tablet Commonly known as: CARDIZEM Take 1 tablet (60 mg total) by mouth every 8 (eight) hours.   furosemide 40 MG tablet Commonly known as: Lasix Take 1 tablet (40 mg total) by mouth 2 (two) times daily. Increase to 1 tablet (40 mg total) by mouth THREE TIMES daily (total daily dose 120 mg) as needed for up to 3 days for increased leg swelling, shortness of breath, weight gain 5+ lbs over 1-2 days. Seek medical care if these symptoms are not improving with increased dose. Reduce dose to 1 tablet (40 mg total) by mouth ONCE DAILY if dizziness or low blood pressure on bid dose.   gabapentin 100 MG capsule Commonly known as: NEURONTIN Take 2 capsules (200 mg total) by mouth 3 (three) times daily.   Iron (Ferrous Sulfate) 325 (65 Fe) MG Tabs Take 325 mg by mouth daily at 12 noon.   levothyroxine 125 MCG tablet Commonly known as: SYNTHROID Take 2 tablets (250 mcg total) by mouth daily. Takes two tablets for a total of 250 mcg daily.   metolazone 2.5 MG tablet Commonly known as: ZAROXOLYN Take 2.5 mg by mouth daily as needed (if weight up > 5 pounds).   Metoprolol Tartrate 75 MG Tabs Take 75 mg by mouth 2 (two) times daily.   mouth rinse Liqd solution 15 mLs by Mouth Rinse route as needed (oral care).   multivitamin tablet Take 1 tablet by mouth daily.   naloxone 4 MG/0.1ML Liqd nasal spray kit Commonly known as: NARCAN Place 1 spray into the nose once.   pantoprazole 40 MG tablet Commonly known as: PROTONIX Take 40 mg by mouth daily.    polyethylene glycol 17 g packet Commonly known as: MIRALAX / GLYCOLAX Take 17 g by mouth daily as needed for moderate constipation.   potassium chloride 20 MEQ packet Commonly known as: KLOR-CON Take 20 mEq by mouth daily.   rOPINIRole 3 MG tablet Commonly known as: REQUIP Take 1 tablet (3 mg total) by mouth at bedtime.   Semaglutide (1 MG/DOSE) 2 MG/1.5ML Sopn Inject 0.75 mLs into the skin once a week.   traMADol 50 MG tablet Commonly known as: ULTRAM Take 50 mg by mouth every 6 (six) hours as needed for moderate pain.   zinc gluconate 50 MG tablet Take 50 mg by mouth daily.         Subjective   Pt reports feeling improved. She states that her breathing is somewhat shallow but unlabored and denies SOB, CP, palpitations. Denies dysuria.   All questions and concerns of patient and husband were addressed at time of discharge.  Objective  Blood pressure 109/79,  pulse (!) 106, temperature 97.6 F (36.4 C), resp. rate 18, height _0  (1.626 m), weight 132.2 kg, SpO2 100 %.   General: Pt is alert, awake, not in acute distress Cardiovascular: RRR, S1/S2 +, no rubs, no gallops Respiratory: CTA bilaterally, no wheezing, no rhonchi Abdominal: Soft, NT, ND, bowel sounds + Extremities: no edema, no cyanosis  The results of significant diagnostics from this hospitalization (including imaging, microbiology, ancillary and laboratory) are listed below for reference.   Imaging studies: DG Chest Port 1 View  Result Date: 07/16/2022 CLINICAL DATA:  10027 Tachypnea 10027 EXAM: PORTABLE CHEST 1 VIEW COMPARISON:  Chest x-ray 07/08/2022 FINDINGS: Persistently enlarged cardiac silhouette. The heart and mediastinal contours are within normal limits. Prominent hilar vasculature. No focal consolidation. Increased marking. No pleural effusion. No pneumothorax. No acute osseous abnormality. IMPRESSION: Cardiomegaly with pulmonary edema. Electronically Signed   By: Iven Finn M.D.   On:  07/16/2022 20:54   DG Chest Port 1 View  Result Date: 07/08/2022 CLINICAL DATA:  Tachycardia, possible sepsis EXAM: PORTABLE CHEST 1 VIEW COMPARISON:  05/27/2022 FINDINGS: Mild to moderate enlargement of the cardiopericardial silhouette. Mildly improved retrocardiac aeration compared to previous. There is likely some linear subsegmental atelectasis in both lower lobes. Mild indistinctness of the pulmonary vasculature suggesting pulmonary venous hypertension. No compelling findings of pneumonia. Thoracic spondylosis noted. IMPRESSION: 1. Mild to moderate enlargement of the cardiopericardial silhouette with pulmonary venous hypertension but no overt edema. 2. Mild subsegmental atelectasis in both lower lobes. 3. Thoracic spondylosis. 4. Mildly improved retrocardiac aeration compared to previous. Electronically Signed   By: Van Clines M.D.   On: 07/08/2022 20:12    Labs: Basic Metabolic Panel: Recent Labs  Lab 07/11/22 0547 07/13/22 0519 07/15/22 2316 07/16/22 1501  NA 141 141 141 139  K 3.6 3.6 4.5 4.8  CL 99 96* 98 93*  CO2 35* 35* 34* 33*  GLUCOSE 129* 98 120* 110*  BUN 43* 31* 41* 48*  CREATININE 1.13* 0.90 1.11* 1.35*  CALCIUM 9.0 8.5* 8.7* 8.5*  MG 2.1  --  2.3  --   PHOS 3.0  --   --   --    CBC: Recent Labs  Lab 07/11/22 0547 07/13/22 0519 07/14/22 0612 07/16/22 1501  WBC 11.8* 13.9* 13.6* 13.1*  HGB 10.3* 10.6* 10.7* 10.8*  HCT 35.5* 37.3 38.4 38.0  MCV 86.6 89.2 89.7 89.8  PLT 279 311 337 386   Microbiology: Results for orders placed or performed during the hospital encounter of 07/08/2022  Culture, blood (Routine x 2)     Status: None   Collection Time: 07/08/22  7:39 PM   Specimen: BLOOD  Result Value Ref Range Status   Specimen Description BLOOD BLOOD RIGHT ARM  Final   Special Requests   Final    BOTTLES DRAWN AEROBIC AND ANAEROBIC Blood Culture adequate volume   Culture   Final    NO GROWTH 5 DAYS Performed at [facility], [address] [place],  [address]    Report Status 07/13/2022 FINAL  Final  Culture, blood (Routine x 2)     Status: None   Collection Time: 07/08/22  7:47 PM   Specimen: BLOOD  Result Value Ref Range Status   Specimen Description BLOOD BLOOD LEFT ARM  Final   Special Requests   Final    BOTTLES DRAWN AEROBIC AND ANAEROBIC Blood Culture adequate volume   Culture   Final    NO GROWTH 5 DAYS Performed at [facility], [address] [place], [place] [address]    Report Status 07/13/2022 FINAL  Final  Resp panel by RT-PCR (RSV, Flu A&B, Covid) Anterior Nasal Swab     Status: None   Collection Time: 07/08/22  7:47 PM   Specimen: Anterior Nasal Swab  Result Value Ref Range Status   SARS Coronavirus 2 by RT PCR NEGATIVE NEGATIVE Final    Comment: (NOTE) SARS-CoV-2 target nucleic acids are NOT DETECTED.  The SARS-CoV-2 RNA is generally detectable in upper respiratory specimens during the acute phase of infection. The lowest concentration of SARS-CoV-2 viral copies this assay can detect is 138 copies/mL. A negative result does not preclude SARS-Cov-2 infection and should not be used as the sole basis for treatment or other patient management decisions. A negative result may occur with  improper specimen collection/handling, submission of specimen other than nasopharyngeal swab, presence of viral mutation(s) within the areas targeted by this assay, and inadequate number of viral copies(<138 copies/mL). A negative result must be combined with clinical observations, patient history, and epidemiological information. The expected result is Negative.  Fact Sheet for Patients:  EntrepreneurPulse.com.au  Fact Sheet for Healthcare Providers:  IncredibleEmployment.be  This test is no t yet approved or cleared by the [place] FDA and  has been authorized for detection and/or diagnosis of SARS-CoV-2 by FDA under an Emergency Use Authorization  (EUA). This EUA will remain  in effect (meaning this test can be used) for the duration of the COVID-19 declaration under Section 564(b)(1) of the Act, 21 U.S.C.section 360bbb-3(b)(1), unless the authorization is terminated  or revoked sooner.       Influenza A by PCR NEGATIVE NEGATIVE Final   Influenza B by PCR NEGATIVE NEGATIVE Final    Comment: (NOTE) The Xpert Xpress SARS-CoV-2/FLU/RSV plus assay is intended as an aid in the diagnosis of influenza from Nasopharyngeal swab specimens and should not be used as a sole basis for treatment. Nasal washings and aspirates are unacceptable for Xpert Xpress SARS-CoV-2/FLU/RSV testing.  Fact Sheet for Patients: EntrepreneurPulse.com.au  Fact Sheet for Healthcare Providers: IncredibleEmployment.be  This test is not yet approved or cleared by the [place] FDA and has been authorized for detection and/or diagnosis of SARS-CoV-2 by FDA under an Emergency Use Authorization (EUA). This EUA will remain in effect (meaning this test can be used) for the duration of the COVID-19 declaration under Section 564(b)(1) of the Act, 21 U.S.C. section 360bbb-3(b)(1), unless the authorization is terminated or revoked.     Resp Syncytial Virus by PCR NEGATIVE NEGATIVE Final    Comment: (NOTE) Fact Sheet for Patients: EntrepreneurPulse.com.au  Fact Sheet for Healthcare Providers: IncredibleEmployment.be  This test is not yet approved or cleared by the [place] FDA and has been authorized for detection and/or diagnosis of SARS-CoV-2 by FDA under an Emergency Use Authorization (EUA). This EUA will remain in effect (meaning this test can be used) for the duration of the COVID-19 declaration under Section 564(b)(1) of the Act, 21 U.S.C. section 360bbb-3(b)(1), unless the authorization is terminated or revoked.  Performed at [facility], [address],  [place], [place] [address]   Urine Culture     Status: None   Collection Time: 07/13/22  9:00 AM   Specimen: Urine, Clean Catch  Result Value Ref Range Status   Specimen Description   Final    URINE, CLEAN CATCH Performed at [facility], [address] [place], [place] [address]    Special Requests   Final  NONE Performed at [facility], [address], [place], [place] [address]    Culture   Final    NO GROWTH Performed at [facility], [place] [address] [place], [place] [address]    Report Status 07/14/2022 FINAL  Final   Time coordinating discharge: Over 30 minutes  Richarda Osmond, MD  [facility] 08/08/2022, 9:05 AM

## 2022-07-17 ENCOUNTER — Inpatient Hospital Stay (HOSPITAL_COMMUNITY)
Admission: EM | Admit: 2022-07-17 | Discharge: 2022-08-13 | Disposition: E | Payer: Medicare Other | Source: Skilled Nursing Facility | Attending: Internal Medicine | Admitting: Internal Medicine

## 2022-07-17 ENCOUNTER — Emergency Department: Payer: Medicare Other

## 2022-07-17 ENCOUNTER — Other Ambulatory Visit: Payer: Self-pay

## 2022-07-17 DIAGNOSIS — F32A Depression, unspecified: Secondary | ICD-10-CM | POA: Diagnosis present

## 2022-07-17 DIAGNOSIS — I469 Cardiac arrest, cause unspecified: Secondary | ICD-10-CM | POA: Diagnosis not present

## 2022-07-17 DIAGNOSIS — Z5320 Procedure and treatment not carried out because of patient's decision for unspecified reasons: Secondary | ICD-10-CM | POA: Diagnosis present

## 2022-07-17 DIAGNOSIS — K5900 Constipation, unspecified: Secondary | ICD-10-CM | POA: Diagnosis not present

## 2022-07-17 DIAGNOSIS — T428X5A Adverse effect of antiparkinsonism drugs and other central muscle-tone depressants, initial encounter: Secondary | ICD-10-CM | POA: Diagnosis not present

## 2022-07-17 DIAGNOSIS — I890 Lymphedema, not elsewhere classified: Secondary | ICD-10-CM | POA: Diagnosis present

## 2022-07-17 DIAGNOSIS — N3090 Cystitis, unspecified without hematuria: Secondary | ICD-10-CM | POA: Diagnosis present

## 2022-07-17 DIAGNOSIS — M549 Dorsalgia, unspecified: Secondary | ICD-10-CM | POA: Diagnosis present

## 2022-07-17 DIAGNOSIS — Z79899 Other long term (current) drug therapy: Secondary | ICD-10-CM

## 2022-07-17 DIAGNOSIS — I272 Pulmonary hypertension, unspecified: Secondary | ICD-10-CM | POA: Diagnosis present

## 2022-07-17 DIAGNOSIS — E1140 Type 2 diabetes mellitus with diabetic neuropathy, unspecified: Secondary | ICD-10-CM | POA: Diagnosis present

## 2022-07-17 DIAGNOSIS — I5023 Acute on chronic systolic (congestive) heart failure: Secondary | ICD-10-CM | POA: Diagnosis present

## 2022-07-17 DIAGNOSIS — E1122 Type 2 diabetes mellitus with diabetic chronic kidney disease: Secondary | ICD-10-CM | POA: Diagnosis present

## 2022-07-17 DIAGNOSIS — Z7985 Long-term (current) use of injectable non-insulin antidiabetic drugs: Secondary | ICD-10-CM

## 2022-07-17 DIAGNOSIS — R1030 Lower abdominal pain, unspecified: Secondary | ICD-10-CM | POA: Diagnosis not present

## 2022-07-17 DIAGNOSIS — J90 Pleural effusion, not elsewhere classified: Secondary | ICD-10-CM | POA: Insufficient documentation

## 2022-07-17 DIAGNOSIS — J810 Acute pulmonary edema: Secondary | ICD-10-CM

## 2022-07-17 DIAGNOSIS — F410 Panic disorder [episodic paroxysmal anxiety] without agoraphobia: Secondary | ICD-10-CM | POA: Diagnosis not present

## 2022-07-17 DIAGNOSIS — I130 Hypertensive heart and chronic kidney disease with heart failure and stage 1 through stage 4 chronic kidney disease, or unspecified chronic kidney disease: Secondary | ICD-10-CM | POA: Diagnosis present

## 2022-07-17 DIAGNOSIS — D631 Anemia in chronic kidney disease: Secondary | ICD-10-CM | POA: Diagnosis present

## 2022-07-17 DIAGNOSIS — Z7189 Other specified counseling: Secondary | ICD-10-CM

## 2022-07-17 DIAGNOSIS — Z85850 Personal history of malignant neoplasm of thyroid: Secondary | ICD-10-CM

## 2022-07-17 DIAGNOSIS — I712 Thoracic aortic aneurysm, without rupture, unspecified: Secondary | ICD-10-CM | POA: Diagnosis present

## 2022-07-17 DIAGNOSIS — I959 Hypotension, unspecified: Secondary | ICD-10-CM | POA: Diagnosis present

## 2022-07-17 DIAGNOSIS — J440 Chronic obstructive pulmonary disease with acute lower respiratory infection: Secondary | ICD-10-CM | POA: Diagnosis present

## 2022-07-17 DIAGNOSIS — I4892 Unspecified atrial flutter: Secondary | ICD-10-CM | POA: Diagnosis present

## 2022-07-17 DIAGNOSIS — I429 Cardiomyopathy, unspecified: Secondary | ICD-10-CM | POA: Diagnosis present

## 2022-07-17 DIAGNOSIS — I5020 Unspecified systolic (congestive) heart failure: Secondary | ICD-10-CM

## 2022-07-17 DIAGNOSIS — Z7901 Long term (current) use of anticoagulants: Secondary | ICD-10-CM

## 2022-07-17 DIAGNOSIS — E875 Hyperkalemia: Secondary | ICD-10-CM | POA: Diagnosis present

## 2022-07-17 DIAGNOSIS — Z515 Encounter for palliative care: Secondary | ICD-10-CM

## 2022-07-17 DIAGNOSIS — J984 Other disorders of lung: Secondary | ICD-10-CM | POA: Diagnosis present

## 2022-07-17 DIAGNOSIS — E11649 Type 2 diabetes mellitus with hypoglycemia without coma: Secondary | ICD-10-CM | POA: Diagnosis not present

## 2022-07-17 DIAGNOSIS — J9601 Acute respiratory failure with hypoxia: Secondary | ICD-10-CM | POA: Diagnosis present

## 2022-07-17 DIAGNOSIS — Y95 Nosocomial condition: Secondary | ICD-10-CM

## 2022-07-17 DIAGNOSIS — I480 Paroxysmal atrial fibrillation: Secondary | ICD-10-CM | POA: Diagnosis present

## 2022-07-17 DIAGNOSIS — G8929 Other chronic pain: Secondary | ICD-10-CM | POA: Diagnosis present

## 2022-07-17 DIAGNOSIS — I700 Atherosclerosis of aorta: Secondary | ICD-10-CM | POA: Diagnosis present

## 2022-07-17 DIAGNOSIS — G253 Myoclonus: Secondary | ICD-10-CM | POA: Diagnosis not present

## 2022-07-17 DIAGNOSIS — D72829 Elevated white blood cell count, unspecified: Secondary | ICD-10-CM | POA: Diagnosis present

## 2022-07-17 DIAGNOSIS — E890 Postprocedural hypothyroidism: Secondary | ICD-10-CM | POA: Diagnosis present

## 2022-07-17 DIAGNOSIS — J9622 Acute and chronic respiratory failure with hypercapnia: Secondary | ICD-10-CM | POA: Diagnosis present

## 2022-07-17 DIAGNOSIS — G9341 Metabolic encephalopathy: Secondary | ICD-10-CM | POA: Diagnosis present

## 2022-07-17 DIAGNOSIS — J189 Pneumonia, unspecified organism: Secondary | ICD-10-CM | POA: Diagnosis present

## 2022-07-17 DIAGNOSIS — E039 Hypothyroidism, unspecified: Secondary | ICD-10-CM | POA: Diagnosis present

## 2022-07-17 DIAGNOSIS — E662 Morbid (severe) obesity with alveolar hypoventilation: Secondary | ICD-10-CM | POA: Diagnosis present

## 2022-07-17 DIAGNOSIS — Z6841 Body Mass Index (BMI) 40.0 and over, adult: Secondary | ICD-10-CM

## 2022-07-17 DIAGNOSIS — J918 Pleural effusion in other conditions classified elsewhere: Secondary | ICD-10-CM | POA: Diagnosis present

## 2022-07-17 DIAGNOSIS — I2510 Atherosclerotic heart disease of native coronary artery without angina pectoris: Secondary | ICD-10-CM | POA: Diagnosis present

## 2022-07-17 DIAGNOSIS — Z79890 Hormone replacement therapy (postmenopausal): Secondary | ICD-10-CM

## 2022-07-17 DIAGNOSIS — I4891 Unspecified atrial fibrillation: Principal | ICD-10-CM

## 2022-07-17 DIAGNOSIS — N1831 Chronic kidney disease, stage 3a: Secondary | ICD-10-CM | POA: Diagnosis present

## 2022-07-17 DIAGNOSIS — R262 Difficulty in walking, not elsewhere classified: Secondary | ICD-10-CM | POA: Diagnosis present

## 2022-07-17 DIAGNOSIS — D649 Anemia, unspecified: Secondary | ICD-10-CM | POA: Diagnosis present

## 2022-07-17 DIAGNOSIS — Z66 Do not resuscitate: Secondary | ICD-10-CM | POA: Diagnosis not present

## 2022-07-17 DIAGNOSIS — N179 Acute kidney failure, unspecified: Secondary | ICD-10-CM | POA: Diagnosis present

## 2022-07-17 DIAGNOSIS — G2581 Restless legs syndrome: Secondary | ICD-10-CM | POA: Diagnosis present

## 2022-07-17 DIAGNOSIS — I482 Chronic atrial fibrillation, unspecified: Secondary | ICD-10-CM

## 2022-07-17 DIAGNOSIS — I878 Other specified disorders of veins: Secondary | ICD-10-CM | POA: Diagnosis present

## 2022-07-17 DIAGNOSIS — Z9989 Dependence on other enabling machines and devices: Secondary | ICD-10-CM

## 2022-07-17 DIAGNOSIS — F05 Delirium due to known physiological condition: Secondary | ICD-10-CM | POA: Diagnosis not present

## 2022-07-17 DIAGNOSIS — J9621 Acute and chronic respiratory failure with hypoxia: Secondary | ICD-10-CM | POA: Diagnosis present

## 2022-07-17 DIAGNOSIS — E869 Volume depletion, unspecified: Secondary | ICD-10-CM | POA: Diagnosis not present

## 2022-07-17 DIAGNOSIS — E785 Hyperlipidemia, unspecified: Secondary | ICD-10-CM | POA: Diagnosis present

## 2022-07-17 DIAGNOSIS — Z90710 Acquired absence of both cervix and uterus: Secondary | ICD-10-CM

## 2022-07-17 DIAGNOSIS — R7989 Other specified abnormal findings of blood chemistry: Secondary | ICD-10-CM | POA: Diagnosis present

## 2022-07-17 DIAGNOSIS — I872 Venous insufficiency (chronic) (peripheral): Secondary | ICD-10-CM | POA: Diagnosis present

## 2022-07-17 DIAGNOSIS — R410 Disorientation, unspecified: Secondary | ICD-10-CM | POA: Insufficient documentation

## 2022-07-17 DIAGNOSIS — E876 Hypokalemia: Secondary | ICD-10-CM | POA: Diagnosis not present

## 2022-07-17 DIAGNOSIS — G4733 Obstructive sleep apnea (adult) (pediatric): Secondary | ICD-10-CM | POA: Diagnosis present

## 2022-07-17 LAB — CBC
HCT: 41.1 % (ref 36.0–46.0)
Hemoglobin: 11.4 g/dL — ABNORMAL LOW (ref 12.0–15.0)
MCH: 25.4 pg — ABNORMAL LOW (ref 26.0–34.0)
MCHC: 27.7 g/dL — ABNORMAL LOW (ref 30.0–36.0)
MCV: 91.7 fL (ref 80.0–100.0)
Platelets: 410 10*3/uL — ABNORMAL HIGH (ref 150–400)
RBC: 4.48 MIL/uL (ref 3.87–5.11)
RDW: 24 % — ABNORMAL HIGH (ref 11.5–15.5)
WBC: 14.2 10*3/uL — ABNORMAL HIGH (ref 4.0–10.5)
nRBC: 0.7 % — ABNORMAL HIGH (ref 0.0–0.2)

## 2022-07-17 LAB — BASIC METABOLIC PANEL
Anion gap: 13 (ref 5–15)
BUN: 63 mg/dL — ABNORMAL HIGH (ref 8–23)
CO2: 28 mmol/L (ref 22–32)
Calcium: 8.4 mg/dL — ABNORMAL LOW (ref 8.9–10.3)
Chloride: 94 mmol/L — ABNORMAL LOW (ref 98–111)
Creatinine, Ser: 1.61 mg/dL — ABNORMAL HIGH (ref 0.44–1.00)
GFR, Estimated: 35 mL/min — ABNORMAL LOW (ref >60)
Glucose, Bld: 154 mg/dL — ABNORMAL HIGH (ref 70–99)
Potassium: 5.2 mmol/L — ABNORMAL HIGH (ref 3.5–5.1)
Sodium: 135 mmol/L (ref 135–145)

## 2022-07-17 LAB — BLOOD GAS, VENOUS
Acid-Base Excess: 8.1 mmol/L — ABNORMAL HIGH (ref 0.0–2.0)
Bicarbonate: 39.4 mmol/L — ABNORMAL HIGH (ref 20.0–28.0)
O2 Saturation: 39.8 %
Patient temperature: 37
pCO2, Ven: 94 mmHg (ref 44–60)
pH, Ven: 7.23 — ABNORMAL LOW (ref 7.25–7.43)
pO2, Ven: 38 mmHg (ref 32–45)

## 2022-07-17 LAB — TROPONIN I (HIGH SENSITIVITY)
Troponin I (High Sensitivity): 24 ng/L — ABNORMAL HIGH (ref <18)
Troponin I (High Sensitivity): 24 ng/L — ABNORMAL HIGH (ref <18)

## 2022-07-17 LAB — PROCALCITONIN: Procalcitonin: 0.54 ng/mL

## 2022-07-17 LAB — BRAIN NATRIURETIC PEPTIDE: B Natriuretic Peptide: 2302.4 pg/mL — ABNORMAL HIGH (ref 0.0–100.0)

## 2022-07-17 LAB — TSH: TSH: 1.771 u[IU]/mL (ref 0.350–4.500)

## 2022-07-17 LAB — GLUCOSE, CAPILLARY: Glucose-Capillary: 96 mg/dL (ref 70–99)

## 2022-07-17 MED ORDER — DILTIAZEM HCL-DEXTROSE 125-5 MG/125ML-% IV SOLN (PREMIX): 5.0000 mg/h | INTRAVENOUS | Status: DC

## 2022-07-17 MED ORDER — LEVOTHYROXINE SODIUM 50 MCG PO TABS
250.0000 ug | ORAL_TABLET | Freq: Every day | ORAL | Status: DC
  Administered 2022-07-19 – 2022-08-11 (×22): 250 ug via ORAL
  Filled 2022-07-17 (×26): qty 1

## 2022-07-17 MED ORDER — NOREPINEPHRINE 4 MG/250ML-% IV SOLN
2.0000 ug/min | INTRAVENOUS | Status: DC
  Administered 2022-07-17: 2 ug/min via INTRAVENOUS
  Administered 2022-07-18: 6 ug/min via INTRAVENOUS
  Filled 2022-07-17 (×2): qty 250

## 2022-07-17 MED ORDER — HEPARIN SODIUM (PORCINE) 5000 UNIT/ML IJ SOLN
5000.0000 [IU] | Freq: Three times a day (TID) | INTRAMUSCULAR | Status: DC
  Administered 2022-07-17 – 2022-07-18 (×2): 5000 [IU] via SUBCUTANEOUS
  Filled 2022-07-17 (×2): qty 1

## 2022-07-17 MED ORDER — ONDANSETRON HCL 4 MG/2ML IJ SOLN: 4.0000 mg | Freq: Once | INTRAMUSCULAR | Status: AC

## 2022-07-17 MED ORDER — ONDANSETRON HCL 4 MG/2ML IJ SOLN
INTRAMUSCULAR | Status: AC
  Administered 2022-07-17: 4 mg via INTRAVENOUS
  Filled 2022-07-17: qty 2

## 2022-07-17 MED ORDER — AMIODARONE HCL IN DEXTROSE 360-4.14 MG/200ML-% IV SOLN: 30.0000 mg/h | INTRAVENOUS | Status: DC

## 2022-07-17 MED ORDER — DILTIAZEM HCL 60 MG PO TABS: 60.0000 mg | ORAL_TABLET | Freq: Three times a day (TID) | ORAL | Status: AC

## 2022-07-17 MED ORDER — ACETAZOLAMIDE 250 MG PO TABS
250.0000 mg | ORAL_TABLET | Freq: Once | ORAL | Status: AC
  Administered 2022-07-17: 250 mg via ORAL
  Filled 2022-07-17: qty 1

## 2022-07-17 MED ORDER — METOPROLOL TARTRATE 50 MG PO TABS: 50.0000 mg | ORAL_TABLET | Freq: Two times a day (BID) | ORAL | Status: DC

## 2022-07-17 MED ORDER — DILTIAZEM HCL 25 MG/5ML IV SOLN
10.0000 mg | Freq: Once | INTRAVENOUS | Status: AC
  Administered 2022-07-17: 10 mg via INTRAVENOUS
  Filled 2022-07-17: qty 5

## 2022-07-17 MED ORDER — METOPROLOL TARTRATE 75 MG PO TABS: 75.0000 mg | ORAL_TABLET | Freq: Two times a day (BID) | ORAL | Status: AC

## 2022-07-17 MED ORDER — ATORVASTATIN CALCIUM 20 MG PO TABS
20.0000 mg | ORAL_TABLET | Freq: Every day | ORAL | Status: DC
  Administered 2022-07-18 – 2022-08-12 (×25): 20 mg via ORAL
  Filled 2022-07-17 (×25): qty 1

## 2022-07-17 MED ORDER — FUROSEMIDE 10 MG/ML IJ SOLN
40.0000 mg | Freq: Once | INTRAMUSCULAR | Status: AC
  Administered 2022-07-17: 40 mg via INTRAVENOUS
  Filled 2022-07-17: qty 4

## 2022-07-17 MED ORDER — VANCOMYCIN HCL 2000 MG/400ML IV SOLN
2000.0000 mg | Freq: Once | INTRAVENOUS | Status: AC
  Administered 2022-07-17: 2000 mg via INTRAVENOUS
  Filled 2022-07-17: qty 400

## 2022-07-17 MED ORDER — SODIUM CHLORIDE 0.9 % IV SOLN
250.0000 mL | INTRAVENOUS | Status: DC
  Administered 2022-07-18: 250 mL via INTRAVENOUS

## 2022-07-17 MED ORDER — POLYETHYLENE GLYCOL 3350 17 G PO PACK
17.0000 g | PACK | Freq: Every day | ORAL | Status: DC | PRN
  Administered 2022-08-07 – 2022-08-12 (×3): 17 g via ORAL
  Filled 2022-07-17 (×3): qty 1

## 2022-07-17 MED ORDER — SODIUM CHLORIDE 0.9 % IV SOLN
500.0000 mg | Freq: Once | INTRAVENOUS | Status: AC
  Administered 2022-07-17: 500 mg via INTRAVENOUS
  Filled 2022-07-17: qty 5

## 2022-07-17 MED ORDER — AMIODARONE LOAD VIA INFUSION
150.0000 mg | Freq: Once | INTRAVENOUS | Status: DC
  Filled 2022-07-17: qty 83.34

## 2022-07-17 MED ORDER — PANTOPRAZOLE SODIUM 40 MG PO TBEC
40.0000 mg | DELAYED_RELEASE_TABLET | Freq: Every day | ORAL | Status: DC
  Administered 2022-07-18 – 2022-08-12 (×26): 40 mg via ORAL
  Filled 2022-07-17 (×26): qty 1

## 2022-07-17 MED ORDER — SODIUM CHLORIDE 0.9 % IV SOLN
2.0000 g | Freq: Once | INTRAVENOUS | Status: AC
  Administered 2022-07-17: 2 g via INTRAVENOUS
  Filled 2022-07-17: qty 12.5

## 2022-07-17 MED ORDER — IOHEXOL 350 MG/ML SOLN
75.0000 mL | Freq: Once | INTRAVENOUS | Status: AC | PRN
  Administered 2022-07-17: 75 mL via INTRAVENOUS

## 2022-07-17 MED ORDER — AMIODARONE HCL IN DEXTROSE 360-4.14 MG/200ML-% IV SOLN
60.0000 mg/h | INTRAVENOUS | Status: DC
  Filled 2022-07-17: qty 200

## 2022-07-17 MED ORDER — FUROSEMIDE 10 MG/ML IJ SOLN
40.0000 mg | Freq: Every day | INTRAMUSCULAR | Status: DC
  Administered 2022-07-18: 40 mg via INTRAVENOUS
  Filled 2022-07-17: qty 4

## 2022-07-17 MED ORDER — DOCUSATE SODIUM 100 MG PO CAPS: 100.0000 mg | ORAL_CAPSULE | Freq: Two times a day (BID) | ORAL | Status: DC | PRN

## 2022-07-17 NOTE — ED Provider Notes (Addendum)
[facility] Provider Note    Event Date/Time   First MD Initiated Contact with Patient 07/27/2022 1544     (approximate)   History   Shortness of Breath   HPI  Madison Mcbride is a 67 y.o. female  with pmh diastolic CHF, DM, HTN, HLD OSA on CPAP, CKD who presents because of shortness of breath tachycardia.  Patient was dc'd this AM from admission for a fib RVR. She was treated for a UTI as sh emet sirs criteria. She was on a cardizem gtt which was transitioned to PO.  Yesterday patient did develop A-fib RVR because she missed a dose of metoprolol.  Cardiology was consulted and she had the p.o. metoprolol uptitrated from 50 mg to 75 mg twice daily and Cardizem 60 mg p.o. added every 8 hours.  Patient tells me that she feels more short of breath since leaving the hospital.  She is denying chest pain.  Does have intermittent sweating this has been going on since she was hospitalized.  No other significant new symptoms.     Past Medical History:  Diagnosis Date   CHF (congestive heart failure) ([place])    Diabetes mellitus without complication (HCC)    Hypothyroidism    Lymphedema    RLS (restless legs syndrome)    Sleep apnea    Thyroid cancer ([place]) 2007    Patient Active Problem List   Diagnosis Date Noted   Chronic atrial fibrillation with RVR ([place]) 08/05/2022   Paroxysmal atrial fibrillation with RVR ([place]) 07/16/2022   SIRS (systemic inflammatory response syndrome) ([place]) 07/15/2022   Tachycardia 07/15/2022   AKI (acute kidney injury) ([place]) 07/15/2022   UTI (urinary tract infection) 07/05/2022   HTN (hypertension) 06/21/2022   Type II diabetes mellitus with renal manifestations ([place]) 06/25/2022   Depression with anxiety 06/29/2022   Iron deficiency anemia 07/12/2022   Sepsis ([place]) 07/08/2022   Myocardial injury 07/07/2022   Atrial fibrillation with RVR ([place]) 07/05/2022   Acute kidney injury ([place]) 05/20/2022   Chronic hypoxic respiratory  failure, on home oxygen therapy ([place]) 05/20/2022   Congestive heart failure (CHF) ([place]) 05/20/2022   Microcytic anemia 05/20/2022   Chronic venous stasis dermatitis of both lower extremities 05/20/2022   Pressure ulcer of buttock 05/20/2022   Inflammatory arthritis 05/20/2022   Sinus bradycardia 05/20/2022   Dysuria 05/20/2022   PAF (paroxysmal atrial fibrillation) ([place]) 03/08/2022   Acute on chronic respiratory failure with hypoxia and hypercapnia (HCC) 03/05/2022   Effusion of knee joint, left 59/56/3875   Chronic systolic CHF (congestive heart failure) (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   Lumbar radiculopathy, acute    Acute midline low back pain without sciatica    Type 2 diabetes mellitus with diabetic neuropathy, without long-term current use of insulin ([place]) 02/21/2020   Hypothyroidism 02/21/2020   Morbid obesity with BMI of 50.0-59.9, adult ([place]) 02/21/2020   CAP (community acquired pneumonia) 02/21/2020   Bilateral cellulitis of lower leg 02/21/2020   Acute respiratory failure with hypoxia and hypercapnia ([place]) 02/21/2020   Acute renal failure superimposed on stage 3a chronic kidney disease ([place]) 02/21/2020   Chronic kidney disease, stage 3a (  [place]) 01/27/2020   Transaminitis 01/27/2020   Lymphedema of both lower extremities 08/21/2016   Benign essential hypertension 05/18/2016     Physical Exam  Triage Vital Signs: ED Triage Vitals  Enc Vitals Group     BP 07/18/2022 1522 114/72     Pulse Rate 08/09/2022 1522 (!) 110     Resp 07/25/2022 1522 18     Temp 07/20/2022 1522 97.9 F (36.6  C)     Temp Source 07/30/2022 1522 Oral     SpO2 07/15/2022 1522 95 %     Weight --      Height --      Head Circumference --      [facility] Flow --      Pain Score 07/30/2022 1519 0     Pain Loc --      Pain Edu? --      Excl. in [place]? --     Most recent vital signs: Vitals:   07/15/2022 1800 08/09/2022 1845  BP: 109/84 105/89  Pulse: (!) 120 (!) 117  Resp: (!) 21 (!) 26  Temp:    SpO2: 95% 92%     General: Awake, patient appears ill CV:  Good peripheral perfusion.  Pitting edema bilateral lower extremities with chronic venous stasis changes Resp:  Patient is tachypneic, able to speak in short sentences, decreased air movement throughout Abd:  No distention.  Neuro:             Awake, Alert, Oriented x 3  Other:     ED Results / Procedures / Treatments  Labs (all labs ordered are listed, but only abnormal results are displayed) Labs Reviewed  BASIC METABOLIC PANEL - Abnormal; Notable for the following components:      Result Value   Potassium 5.2 (*)    Chloride 94 (*)    Glucose, Bld 154 (*)    BUN 63 (*)    Creatinine, Ser 1.61 (*)    Calcium 8.4 (*)    GFR, Estimated 35 (*)    All other components within normal limits  CBC - Abnormal; Notable for the following components:   WBC 14.2 (*)    Hemoglobin 11.4 (*)    MCH 25.4 (*)    MCHC 27.7 (*)    RDW 24.0 (*)    Platelets 410 (*)    nRBC 0.7 (*)    All other components within normal limits  BRAIN NATRIURETIC PEPTIDE - Abnormal; Notable for the following components:   B Natriuretic Peptide 2,302.4 (*)    All other components within normal limits  BLOOD GAS, VENOUS - Abnormal; Notable for the following components:   pH, Ven 7.23 (*)    pCO2, Ven 94 (*)    Bicarbonate 39.4 (*)    Acid-Base Excess 8.1 (*)    All other components within normal limits  TROPONIN I (HIGH SENSITIVITY) - Abnormal; Notable for the following components:   Troponin I (High Sensitivity) 24 (*)    All other components within normal limits   TROPONIN I (HIGH SENSITIVITY) - Abnormal; Notable for the following components:   Troponin I (High Sensitivity) 24 (*)    All other components within normal limits  TSH  PROCALCITONIN     EKG  EKG reviewed interpreted myself shows atrial fibrillation with RVR, incomplete right bundle and left anterior fascicular block no acute ischemic changes   RADIOLOGY Chest x-ray reviewed interpreted myself shows pulmonary edema bilateral pleural effusions   PROCEDURES:  Critical Care performed: Yes,  see critical care procedure note(s)  .Critical Care  Performed by: Rada Hay, MD Authorized by: Rada Hay, MD   Critical care provider statement:    Critical care time (minutes):  30   Critical care was time spent personally by me on the following activities:  Development of treatment plan with patient or surrogate, discussions with consultants, evaluation of patient's response to treatment, examination of patient, ordering and review of laboratory studies, ordering and review of radiographic studies, ordering and performing treatments and interventions, pulse oximetry, re-evaluation of patient's condition and review of old charts .1-3 Lead EKG Interpretation  Performed by: Rada Hay, MD Authorized by: Rada Hay, MD     Interpretation: abnormal     ECG rate assessment: tachycardic     Rhythm: atrial fibrillation     Ectopy: none     Conduction: normal     The patient is on the cardiac monitor to evaluate for evidence of arrhythmia and/or significant heart rate changes.   MEDICATIONS ORDERED IN ED: Medications  ceFEPIme (MAXIPIME) 2 g in sodium chloride 0.9 % 100 mL IVPB (2 g Intravenous New Bag/Given (Non-Interop) 08/01/2022 1856)  azithromycin (ZITHROMAX) 500 mg in sodium chloride 0.9 % 250 mL IVPB (500 mg Intravenous New Bag/Given (Non-Interop) 07/22/2022 1859)  vancomycin (VANCOREADY) IVPB 2000 mg/400 mL (has no administration in time range)  diltiazem  (CARDIZEM) injection 10 mg (10 mg Intravenous Given 07/29/2022 1808)  furosemide (LASIX) injection 40 mg (40 mg Intravenous Given 07/23/2022 1810)  iohexol (OMNIPAQUE) 350 MG/ML injection 75 mL (75 mLs Intravenous Contrast Given 07/28/2022 1815)     IMPRESSION / MDM / ASSESSMENT AND PLAN / ED COURSE  I reviewed the triage vital signs and the nursing notes.                              Patient's presentation is most consistent with acute presentation with potential threat to life or bodily function.  Differential diagnosis includes, but is not limited to, CHF exacerbation, tachycardia induced CHF exacerbation, pulmonary embolism, pneumonia  Patient is a 67 year old female presenting with tachycardia and shortness of breath.  She was recently admitted for days for A-fib tachycardia and SIRS and was treated for UTI.  She was supposed to be discharged on the third but developed A-fib with RVR in the setting of missing metoprolol.  Heart rates were difficult to control and she had metoprolol p.o. increased as well as diltiazem added to her medications.  She was discharged around lunchtime today.  [facility] she was found to be in A-fib with RVR and was hypoxic husband tells me with sats in the 70s and she was immediately sent back to the ED.  Here patient's heart rates are in the 1 teens to 120s to 130s.  She is satting about 90% on 6 L on my evaluation.  She looks winded.  Blood pressure is 120 over 80s.  She is speaking in short sentences.  Labs are notable for leukocytosis of 14 rising creatinine 1.6 and elevated BNP to 2300.  Chest x-ray read by radiology as partially resolving pulmonary edema and possible atelectasis versus pneumonia versus pleural effusions at the bases.  Plan to give some IV diuresis and diltiazem IV.  Will also add on procalcitonin and obtain a CTA of the chest to rule out PE and further evaluate for infiltrate.  Patient will require  readmission.   Procalcitonin mildly elevated at 0.5.  Patient's VBG is concerning for respiratory acidosis.  Given her work of breathing and acidosis I placed her on BiPAP and she feels much better on the BiPAP she is on 22/8.  CTA is negative for PE does show pleural effusions and questionable atelectasis versus infiltrate.  Given her degree of illness will have low threshold to treat for pneumonia with cefepime and azithromycin and vancomycin since this is technically HAP.  His heart rates currently in the low 100s.  Will defer any additional rate control at this time as I expect her heart rate will improved as her respiratory status improves.    Patient now is becoming hypotensive with maps just around 65.  She is mentating okay but her respiratory status remains tenuous.  Opted to discuss with ICU and they will accept the patient.  I think she will likely need a vasopressor and is not really a good candidate to get fluids right now given the pulmonary edema. FINAL CLINICAL IMPRESSION(S) / ED DIAGNOSES   Final diagnoses:  Atrial fibrillation with RVR ([place])  Acute pulmonary edema ([place])  HCAP (healthcare-associated pneumonia)     Rx / DC Orders   ED Discharge Orders     None        Note:  This document was prepared using Dragon voice recognition software and may include unintentional dictation errors.   Rada Hay, MD 08/04/2022 Docia Chuck    Rada Hay, MD 07/13/2022 Docia Chuck    Rada Hay, MD 07/24/2022 2040

## 2022-07-17 NOTE — H&P (Addendum)
NAME:  Madison Mcbride, MRN:  [id number], DOB:  March 06, 1956, LOS: 0 ADMISSION DATE:  08/04/2022, CONSULTATION DATE:  07/31/2022 REFERRING MD: Rada Hay , CHIEF COMPLAINT:  SOB    HPI  67 year old F with PMH of A-fib on Eliquis, diastolic CHF, DM-2, HTN, HLD, hypothyroidism, thyroid cancer, anxiety, depression, morbid obesity, chronic back pain, OSA on CPAP and CKD-3A who presented to the ED with c/o SOB and tachycardia.  On review of chart, patient initially presented to ED on 06/16/2022 from a skilled nursing facility with c/o tachycardia with HR in 130s. Labs showed WBC 12.6.  UA concerning for UTI.  COVID-19, influenza and RSV PCR nonreactive. CXR with cardiomegaly without infiltration or edema. Cultures was sent and patient was started on IV meropenem due to hx of ESBL, and IV Cardizem for A fib w/RVR. Urine culture was not collected on presentation, and  given h/o ESBL infection, she completed a full course of empiric IV meropenem on January 2. She was transitioned to PO metoprolol with adequate rate control. Patient was scheduled to be discharged back to SNF on 07/15/22 but unfortunately she went back into Afib with RVR which was thought to be due to missed morning dose of metoprolol. SNF refused to accept her so discharge was cancelled. Cardiology was consulted with recommendation to increase metoprolol from 50 mg to 75 mg BID and Cardizem 60 mg p.o. added q 8hr. Patient was discharged to SNF today unfortunately approximately two hours after discharge she was sent back to the ED with tachycardia and shortness of breath.   ED Course: Initial vital signs showed HR of 110 beats/minute, BP 114/72 mm Hg, the RR 26 breaths/minute, and the oxygen saturation 95 % on  6 L and a temperature of 97.64F (36.6C).  Pertinent Labs/Diagnostics Findings: Chemistry:Na+/ K+:135/5.2  Glucose:154  BUN/Cr.:63/1.61  CBC: WBC:14.2  Other Lab findings: PCT: 0.54 Lactic acid:3.7  COVID PCR: Negative, Troponin:  24 BNP: 2302  Venous Blood Gas result:  pO2 33; pCO2 94; pH 7.23;  HCO3 39.4, %O2 Sat 39.8.  Imaging:  CXR>small pleural effusions and underlying pulmonary edema or pneumonia, more so on the left side. CTA Chest> 1. No evidence of pulmonary embolism. 2. Moderate to marked severity bilateral lower lobe atelectasis and/or infiltrate. 3. Small to moderate size bilateral pleural effusions.  Patient received IV Cardizem '10mg'$  x 1  for rate control and placed on BiPAP for acute respiratory failure with hypoxia and hypercapnia. .Patient given 30 cc/kg of fluids and started on broad-spectrum antibiotics Vanco cefepime and Flagyl for suspected sepsis. Patient remained hypotensive despite IVF boluses therefore was started on Levophed.  (Sepsis reassessment completed). PCCM consulted.  Past Medical History   Atrial Fibrillation CHF    Diabetes mellitus without complication (HCC)    Hypothyroidism    Lymphedema    RLS (restless legs syndrome)    Sleep apnea on CPAP CKD Stage III HTN HLD    Thyroid cancer ([place]) Depression with Anxiety    Significant Hospital Events   1/5: Admit to ICU with Acute hypoxic hypercapneic respiratory failure in the setting of pulmonary edema and suspected pneumonia  Consults:  Cardiology  Significant Diagnostic Tests:  1/5: Chest Xray>IMPRESSION: There is partial clearing of pulmonary edema. Residual increased density is seen in both lower lung fields, more so on the left side suggesting a small pleural effusions and underlying pulmonary edema or pneumonia, more so on the left side.  1/5: CTA Chest>IMPRESSION: 1. No evidence of pulmonary embolism. 2.  Moderate to marked severity bilateral lower lobe atelectasis and/or infiltrate. 3. Small to moderate size bilateral pleural effusions. 4. Moderate severity cardiomegaly with moderate severity coronary artery calcification. 5. Dilated ascending thoracic aorta. 6. Dilated main pulmonary artery which may represent  pulmonary arterial hypertension. 7. 14 mm x 12 mm mildly hyperdense, exophytic lesion along the lateral aspect of the upper pole of the right kidney. While this may represent a hemorrhagic cyst, correlation with nonemergent renal ultrasound is recommended. 8. Aortic atherosclerosis.  Micro Data:  1/5: SARS-CoV-2 PCR> negative 1/5: Influenza PCR> negative 1/5: Blood culture x2> 1/5: Urine Culture> 1/5: MRSA PCR>>  1/5: Strep pneumo urinary antigen> 1/5: Legionella urinary antigen>  Antimicrobials:/  Vancomycin 1/5> Cefepime 1/5>  OBJECTIVE  Blood pressure (!) 76/58, pulse 99, temperature 97.6 F (36.4 C), temperature source Oral, resp. rate (!) 23, SpO2 97 %.    FiO2 (%):  [45 %] 45 %  No intake or output data in the 24 hours ending 08/09/2022 2132 There were no vitals filed for this visit.  Physical Examination  GENERAL: 67 year-old critically ill patient lying in the bed on BIPAP EYES: Pupils equal, round, reactive to light and accommodation. No scleral icterus. Extraocular muscles intact.  HEENT: Head atraumatic, normocephalic. Oropharynx and nasopharynx clear.  NECK:  Supple, no jugular venous distention. No thyroid enlargement, no tenderness.  LUNGS: Decreased  breath sounds bilaterally, no wheezing, mild rales and rhonchi . No use of accessory muscles of respiration.  CARDIOVASCULAR: S1, S2 normal. Irregular. No murmurs, rubs, or gallops.  ABDOMEN: Soft, nontender, nondistended. Bowel sounds present. No organomegaly or mass.  EXTREMITIES: Mild pitting edema of lower extremities no erythema. 1+ edema, + venous stasis changes in bilateral lower ext. Capillary refill is less than 3 seconds in all extremities. Pulses palpable distally,   NEUROLOGIC:The patient is on BIPAP unable to assess orientation. Unable to assess motor function or muscle strength  in bilaterally to upper and lower extremities. Sensation is intact bilaterally.  Cranial nerves are intact. Cerebellar function  is intact. . Gait not checked.  PSYCHIATRIC:The patient is on BIPAP SKIN: No obvious rash, lesion, or ulcer. Warm to touch  Labs/imaging that I havepersonally reviewed  (right click and "Reselect all SmartList Selections" daily)     Labs   CBC: Recent Labs  Lab 07/11/22 0547 07/13/22 0519 07/14/22 0612 07/16/22 1501 08/08/2022 1523  WBC 11.8* 13.9* 13.6* 13.1* 14.2*  HGB 10.3* 10.6* 10.7* 10.8* 11.4*  HCT 35.5* 37.3 38.4 38.0 41.1  MCV 86.6 89.2 89.7 89.8 91.7  PLT 279 311 337 386 410*    Basic Metabolic Panel: Recent Labs  Lab 07/11/22 0547 07/13/22 0519 07/15/22 2316 07/16/22 1501 07/29/2022 1523  NA 141 141 141 139 135  K 3.6 3.6 4.5 4.8 5.2*  CL 99 96* 98 93* 94*  CO2 35* 35* 34* 33* 28  GLUCOSE 129* 98 120* 110* 154*  BUN 43* 31* 41* 48* 63*  CREATININE 1.13* 0.90 1.11* 1.35* 1.61*  CALCIUM 9.0 8.5* 8.7* 8.5* 8.4*  MG 2.1  --  2.3  --   --   PHOS 3.0  --   --   --   --    GFR: Estimated Creatinine Clearance: 46.2 mL/min (A) (by C-G formula based on SCr of 1.61 mg/dL (H)). Recent Labs  Lab 07/13/22 0519 07/14/22 0612 07/16/22 1501 08/02/2022 1523  PROCALCITON  --   --   --  0.54  WBC 13.9* 13.6* 13.1* 14.2*    Liver Function Tests:  Recent Labs  Lab 07/11/22 0547 07/16/22 1501  AST  --  32  ALT  --  13  ALKPHOS  --  108  BILITOT  --  1.7*  PROT  --  7.3  ALBUMIN 3.0* 3.2*   No results for input(s): "LIPASE", "AMYLASE" in the last 168 hours. No results for input(s): "AMMONIA" in the last 168 hours.  ABG    Component Value Date/Time   PHART 7.43 03/11/2022 0459   PCO2ART 67 (HH) 03/11/2022 0459   PO2ART 84 03/11/2022 0459   HCO3 39.4 (H) 07/16/2022 1808   O2SAT 39.8 07/14/2022 1808     Coagulation Profile: No results for input(s): "INR", "PROTIME" in the last 168 hours.  Cardiac Enzymes: No results for input(s): "CKTOTAL", "CKMB", "CKMBINDEX", "TROPONINI" in the last 168 hours.  HbA1C: Hgb A1c MFr Bld  Date/Time Value Ref Range Status   07/08/2022 07:24 PM 5.3 4.8 - 5.6 % Final    Comment:    (NOTE)         Prediabetes: 5.7 - 6.4         Diabetes: >6.4         Glycemic control for adults with diabetes: <7.0   03/06/2022 04:04 AM 6.4 (H) 4.8 - 5.6 % Final    Comment:    (NOTE) Pre diabetes:          5.7%-6.4%  Diabetes:              >6.4%  Glycemic control for   <7.0% adults with diabetes     CBG: Recent Labs  Lab 07/15/22 0436 07/15/22 2232 07/16/22 1356 07/16/22 2035  GLUCAP 118* 110* 102* 136*    Review of Systems:   UNABLE TO OBTAIN PATIENT ON BIPAP  Past Medical History  She,  has a past medical history of CHF (congestive heart failure) ([place]), Diabetes mellitus without complication ([place]), Hypothyroidism, Lymphedema, RLS (restless legs syndrome), Sleep apnea, and Thyroid cancer ([place]) (2007).   Surgical History    Past Surgical History:  Procedure Laterality Date   ABDOMINAL HYSTERECTOMY     COLONOSCOPY WITH PROPOFOL N/A 08/26/2015   Procedure: COLONOSCOPY WITH PROPOFOL;  Surgeon: Manya Silvas, MD;  Location: [facility] ENDOSCOPY;  Service: Endoscopy;  Laterality: N/A;   THYROIDECTOMY       Social History   reports that she has never smoked. She has never used smokeless tobacco. She reports that she does not drink alcohol and does not use drugs.   Family History   Her family history includes Autoimmune disease in her sister. There is no history of Breast cancer.   Allergies No Known Allergies   Home Medications  Prior to Admission medications   Medication Sig Start Date End Date Taking? Authorizing Provider  acetaminophen (TYLENOL) 650 MG CR tablet Take 1,300 mg by mouth every 8 (eight) hours as needed for pain.   Yes [provider]  ALPRAZolam (XANAX) 0.25 MG tablet Take 1 tablet (0.25 mg total) by mouth 3 (three) times daily as needed for anxiety. 05/29/22  Yes Sreenath, Trula Slade, MD  apixaban (ELIQUIS) 5 MG TABS tablet Take 1 tablet (5 mg total) by mouth 2 (two) times daily.  07/25/21  Yes Dessa Phi, DO  atorvastatin (LIPITOR) 20 MG tablet Take 20 mg by mouth daily.   Yes [provider]  Biotin 5 MG TABS Take 2.5 mg by mouth daily.   Yes [provider]  buPROPion (WELLBUTRIN XL) 150 MG 24 hr tablet Take 150 mg  by mouth daily.   Yes [provider]  Catheter Self-Adhesive Urinary MISC Continue PurWIck prn if available 03/18/22  Yes Emeterio Reeve, DO  Cholecalciferol 25 MCG (1000 UT) tablet Take 1 tablet by mouth daily.   Yes [provider]  cyclobenzaprine (FLEXERIL) 5 MG tablet Take 5 mg by mouth 3 (three) times daily as needed for muscle spasms.   Yes [provider]  diltiazem (CARDIZEM) 60 MG tablet Take 1 tablet (60 mg total) by mouth every 8 (eight) hours. 07/21/2022  Yes Richarda Osmond, MD  furosemide (LASIX) 40 MG tablet Take 1 tablet (40 mg total) by mouth 2 (two) times daily. Increase to 1 tablet (40 mg total) by mouth THREE TIMES daily (total daily dose 120 mg) as needed for up to 3 days for increased leg swelling, shortness of breath, weight gain 5+ lbs over 1-2 days. Seek medical care if these symptoms are not improving with increased dose. Reduce dose to 1 tablet (40 mg total) by mouth ONCE DAILY if dizziness or low blood pressure on bid dose. 03/18/22  Yes Emeterio Reeve, DO  gabapentin (NEURONTIN) 100 MG capsule Take 2 capsules (200 mg total) by mouth 3 (three) times daily. 05/29/22  Yes Sreenath, Sudheer B, MD  Iron, Ferrous Sulfate, 325 (65 Fe) MG TABS Take 325 mg by mouth daily at 12 noon. 05/29/22  Yes Sreenath, Sudheer B, MD  levothyroxine (SYNTHROID) 125 MCG tablet Take 2 tablets (250 mcg total) by mouth daily. Takes two tablets for a total of 250 mcg daily. 07/15/22  Yes Richarda Osmond, MD  metolazone (ZAROXOLYN) 2.5 MG tablet Take 2.5 mg by mouth daily as needed (if weight up > 5 pounds).   Yes [provider]  Metoprolol Tartrate 75 MG TABS Take 75 mg by mouth 2 (two) times daily.  07/28/2022  Yes Richarda Osmond, MD  Mouthwashes (MOUTH RINSE) LIQD solution 15 mLs by Mouth Rinse route as needed (oral care). 03/18/22  Yes Emeterio Reeve, DO  Multiple Vitamin (MULTIVITAMIN) tablet Take 1 tablet by mouth daily.   Yes [provider]  naloxone (NARCAN) nasal spray 4 mg/0.1 mL Place 1 spray into the nose as needed (opioid overdose).   Yes [provider]  pantoprazole (PROTONIX) 40 MG tablet Take 40 mg by mouth daily. 01/11/21  Yes [provider]  polyethylene glycol (MIRALAX / GLYCOLAX) 17 g packet Take 17 g by mouth daily as needed for moderate constipation. 03/18/22  Yes Emeterio Reeve, DO  potassium chloride (KLOR-CON) 20 MEQ packet Take 20 mEq by mouth daily.   Yes [provider]  rOPINIRole (REQUIP) 3 MG tablet Take 1 tablet (3 mg total) by mouth at bedtime. 03/18/22  Yes Emeterio Reeve, DO  Semaglutide, 1 MG/DOSE, 2 MG/1.5ML SOPN Inject 0.75 mLs into the skin once a week. 10/14/20  Yes [provider]  traMADol (ULTRAM) 50 MG tablet Take 50 mg by mouth every 6 (six) hours as needed for moderate pain.   Yes [provider]  zinc gluconate 50 MG tablet Take 50 mg by mouth daily.   Yes [provider]    Scheduled Meds:  atorvastatin  20 mg Oral Daily   furosemide  40 mg Intravenous Daily   heparin  5,000 Units Subcutaneous Q8H   levothyroxine  250 mcg Oral Q0600   metoprolol tartrate  50 mg Oral BID   pantoprazole  40 mg Oral Daily   Continuous Infusions:  sodium chloride     norepinephrine (  LEVOPHED) Adult infusion 9 mcg/min (07/18/22 0139)   PRN Meds:.docusate sodium, polyethylene glycol   Active Hospital Problem list     Assessment & Plan:  # Acute on Chronic Hypoxic Hypercapnic Respiratory Failure secondary to ?CHF exacerbation ,? Pneumonia # OSA on CPAP # COPD without evidence of acute exacerbation CTA neg for PE -Supplemental O2 as needed to maintain O2 sats 88 to 92% -BiPAP wean as  tolerated -High risk for intubation -Follow intermittent Chest X-ray & ABG as needed -Bronchodilators & Pulmicort nebs -Diuresis as BP and renal function permits ~ Start Lasix IV 40 mg BID -Aggressive Pulmonary toilet as able   # Acute on Chronic HFmrEF (recent EF 45-50%) # Shock: Septic +/- Cardiogenic  # Mildly elevated Troponin, suspect demand ischemia PMHx: HTN, HLD, Pulmonary Hypertension Echocardiogram 03/06/22: LVEF 46-96%, normal diastolic parameters, RV systolic function normal, mild to moderate MR, mild to moderate TR -Vasopressors as needed to maintain MAP goal -Trend HS Troponin until peaked  -Check Cortisol  -Diuresis as BP and renal function permits ~ Start 40 mg IV Lasix BID  -Consider resuming home Cardizem and metoprolol once BP and renal function improves -Continue Atorvastatin  # AFib+RVR -Check TSH -TTEcho -Continue Eliquis -Continue Metoprolol and Cardizem once bp stable -Consider IV Amiodarone for rate control while NPO and Hypotensive -Cardiology Consult   # Sepsis in the setting of suspected  Meets SIRS Criteria: HR 110, RR 26, Elevated white count -Monitor fever curve -Trend WBC's & Procalcitonin -Follow cultures as above -Pressor for MAP Goal -Continue empiric antibiotic pending cultures & sensitivities   # AKI on CKD Stage III  # Hyperkalemia -Monitor I&O's / urinary output -Follow BMP -Ensure adequate renal perfusion -Avoid nephrotoxic agents as able -Replace electrolytes as indicated  # Acute Metabolic Encephalopathy  # CO2 Narcosis -Hold Xanax, Benzos and pain meds   # Diabetes Mellitus Type II Hgb A1c is  -CBG's AC & qhs; Target range of 140 to 180 -SSI -Follow ICU Hypo/Hyperglycemia protocol   # Hypothyroidism TSH, 1.771 -Continue home Synthroid -TSH and thyroid panel pending   Best practice:  Diet:  NPO Pain/Anxiety/Delirium protocol (if indicated): No VAP protocol (if indicated): Not indicated DVT prophylaxis: Systemic  AC GI prophylaxis: N/A Glucose control:  SSI No Central venous access:  N/A Arterial line:  N/A Foley:  N/A Mobility:  bed rest  PT consulted: N/A Last date of multidisciplinary goals of care discussion [1/5] Code Status:  full code Disposition: ICU   = Goals of Care = Code Status Order: FULL  Primary Emergency Contact: Pettry,Benjamin, Home Phone: [phone] Wishes to pursue full aggressive treatment and intervention options, including CPR and intubation, but goals of care will be addressed on going with family if that should become necessary.   Critical care time: 45 minutes       Rufina Falco  DNP, CCRN, FNP-C, AGACNP-BC Acute Care Nurse Practitioner [facility]  PCCM on call pager [phone] until 7 am

## 2022-07-17 NOTE — ED Notes (Signed)
Paged ICU per Dr. Starleen Blue

## 2022-07-17 NOTE — Progress Notes (Addendum)
   07/16/22 1930  Assess: MEWS Score  BP 109/79  MAP (mmHg) 89  Pulse Rate (!) 119  Resp (!) 22  SpO2 98 %  O2 Device Nasal Cannula  O2 Flow Rate (L/min) 4 L/min  Assess: MEWS Score  MEWS Temp 0  MEWS Systolic 0  MEWS Pulse 2  MEWS RR 1  MEWS LOC 0  MEWS Score 3  MEWS Score Color Yellow  Assess: SIRS CRITERIA  SIRS Temperature  0  SIRS Pulse 1  SIRS Respirations  1  SIRS WBC 0  SIRS Score Sum  2   During report patient was noted to tachypneic, short of breath and diaphoretic. Denies any pain. Notified NP Randol Kern about current conditions. CBG was 136. Patient was placed on CPAP. Venous blood gas, chest xray and diamox ordered.

## 2022-07-17 NOTE — TOC Progression Note (Signed)
Transition of Care [facility]) - Progression Note    Patient Details  Name: Madison Mcbride MRN: [id number] Date of Birth: Sep 11, 1955  [facility] CM/SW [place], [place] Phone Number: 07/18/2022, 10:18 AM  Clinical Narrative:     Patient will DC to: [facility] Anticipated DC date: 08/09/2022 Family notified:spouse Transport [id number]   Per MD patient ready for DC to [facility] . RN, patient, patient's family, and facility notified of DC. Discharge Summary sent to facility. RN given number for report [phone]. DC packet on chart. Ambulance transport requested for patient.  CSW signing off.   Pricilla Riffle, LCSW  Expected Discharge Plan: Skilled Nursing Facility Barriers to Discharge: No Barriers Identified  Expected Discharge Plan and Services     Post Acute Care Choice: Resumption of Svcs/PTA Provider Living arrangements for the past 2 months: [name], [name] Expected Discharge Date: 07/20/2022                                     Social Determinants of Health ([place]) Interventions [employer]: No Food Insecurity (07/10/2022)  Housing: Low Risk  (07/10/2022)  Transportation Needs: No Transportation Needs (07/10/2022)  Utilities: Not At Risk (07/10/2022)  Tobacco Use: Low Risk  (07/10/2022)    Readmission Risk Interventions    06/26/2022   12:43 PM 03/07/2022    4:12 PM  Readmission Risk Prevention Plan  Transportation Screening Complete Complete  PCP or Specialist Appt within 5-7 Days  Complete  Home Care Screening  Complete  Medication Review (RN CM)  Complete  Medication Review (RN Care Manager) Complete   PCP or Specialist appointment within 3-5 days of discharge Complete   SW Recovery Care/Counseling Consult Complete   Skilled Nursing Facility Complete

## 2022-07-17 NOTE — Progress Notes (Signed)
An USGPIV (ultrasound guided PIV) has been placed for short-term vasopressor infusion. A correctly placed ivWatch must be used when administering Vasopressors. Should this treatment be needed beyond 72 hours, central line access should be obtained.  It will be the responsibility of the bedside nurse to follow best practice to prevent extravasations.   ?

## 2022-07-17 NOTE — Progress Notes (Signed)
Updated husband about patient's current position. Patient is alert and oriented. Breathing is unlabored. Skin is dry and intact. Urine output is minimal. Patient's states she is feeling better. Encouraged patient to use CPAP every night while sleeping.

## 2022-07-17 NOTE — ED Triage Notes (Addendum)
Pt to ED via [facility] from [facility]. Pt was seen, admitted and then d/c approximately 1hr ago on 4L [place]. Facility states pt became more SOB and HR increased and BP decreased. Pt EKG showing afib RVR  EMS VS  BP 117/83 CBG 152 O2 96% on 6L [place] HR 105

## 2022-07-17 NOTE — Progress Notes (Incomplete)
PROGRESS NOTE  Madison Mcbride    DOB: Oct 17, 1955, 67 y.o.  JTT:[id number]    Code Status: Full Code   DOA: 06/24/2022   LOS: 4   Brief hospital course  67 year old F with PMH of A-fib on Eliquis, diastolic CHF, DM-2, HTN, HLD, hypothyroidism, thyroid cancer, anxiety, depression, morbid obesity, chronic back pain, OSA on CPAP and CKD-3A. She presented from SNF to ED 12/28 with tachycardia with HR in 130s.   In ED, she was tachycardic to 120s.  WBC 12.6.  UA concerning for UTI.  COVID-19, influenza and RSV PCR nonreactive.  CXR with cardiomegaly without infiltration or edema.  Cultures obtained.  Patient was started on IV meropenem due to history of ESBL, and IV Cardizem, admitted for further care. Initially meant sepsis criteria for suspected urinary source.    Due to not having a urine culture collected on presentation, and a h/o ESBL infection, she completed a full course of empiric IV meropenem (completed January 2). She had resolution of sepsis criteria and recovered medically.    IV cardizem was initially started for her tachycardia and was transitioned to PO metoprolol. Her HR was well controlled in range of 80-100 throughout her stay.    Due to continued profound weakness, PT/OT recommend return to SNF for rehab. Insurance authorization was denied which delayed patient's discharge.  1/3- patient was discharged to SNF. For unknown reason, her morning metoprolol was held on day of discharge which is likely what lead to her HR increase up to 130s on evening of her discharge. This stopped her transfer. She remained hemodynamically stable and asymptomatic. Her rate was controlled with delivery of her evening metoprolol dose.   1/4- HR remains intermittently elevated to 130s. She remains hemodynamically stable and asymptomatic. Cardiology consult recommended increase in metoprolol dose as well as addition of cardizem. Will keep patient overnight for monitoring of stabilization of her HR  with new medications. Plan to dc to SNF tomorrow if remains stable.  Assessment & Plan  Principal Problem:   UTI (urinary tract infection) Active Problems:   Sepsis ([place])   Myocardial injury   Atrial fibrillation with RVR (HCC)   Chronic systolic CHF (congestive heart failure) (HCC)   HTN (hypertension)   Type II diabetes mellitus with renal manifestations (HCC)   HLD (hyperlipidemia)   Iron deficiency anemia   Chronic kidney disease, stage 3a ([place])   Hypothyroidism   Depression with anxiety   Morbid obesity with BMI of 50.0-59.9, adult (HCC)   SIRS (systemic inflammatory response syndrome) (HCC)   Tachycardia   AKI (acute kidney injury) ([place])   Paroxysmal atrial fibrillation with RVR ([place])  Sepsis due to urinary tract infection: POA.  Had tachycardia, tachypnea, leukocytosis with UTI.  Lactic acid within normal.  She reports some dysuria and pressure sensation with urination.  No fever.  Pro-Cal elevated to 0.31.  Blood cultures NGTD.  History of ESBL.  Unfortunately, her urine culture was canceled.  -Completed meropenem 5 day course  Atrial fibrillation with RVR- HR elevated to 130 when she missed metoprolol dose yesterday. Unclear why it was held. Will attempt to rate control prior to dc. Patient is otherwise hemodynamically stable and asymptomatic. - cardiology following, appreciate recs  - continue metoprolol at increased dose  - add cardizem if BP will tolerate -Continue Eliquis for anticoagulation.   Elevated troponin/demand ischemia/type II MI: Troponin level 38 --> 36 likely demand ischemia from the above and delayed clearance from CKD. -Continue home medications.   Chronic  systolic CHF: TTE on 9/73/5329 with LVEF of 45 to 50% and mild to moderate MVR, TVR and moderate AVR.  Appears euvolemic on exam.  BNP elevated to 1300, slightly higher than baseline. On p.o. Lasix and metolazone at home.  No cardiopulmonary symptoms.  Appears euvolemic on exam. -Resume home Lasix at  40 mg twice daily -Closely monitor fluid and respiratory status     Essential HTN: Normotensive. -Meds as above.   Controlled NIDDM-2 with HLD and CKD-3A: A1c 5.3%.  CBG in 80s consistently. -Discontinue SSI and CBG monitoring -Continue Lipitor.   Iron deficiency anemia: H&H stable. -Monitor as needed   CKD-3A: stable. -Monitor intermittently as needed   Hypothyroidism: TSH slightly elevated -Continue home Synthroid. -Check TSH in 4 to 6 weeks   Depression with anxiety: Stable -Continue home medications   Generalized weakness/physical deconditioning: Patient is from SNF. -PT/OT recommended to return to SNF   Morbid obesity with BMI of 50.0-59.9, adult ([place]) Body mass index is 50.98 kg/m.  Body mass index is 51.73 kg/m.  VTE ppx:  apixaban (ELIQUIS) tablet 5 mg   Diet:     Diet   Diet Heart Room service appropriate? Yes; Fluid consistency: Thin   Consultants: Cardiology   Subjective 07/15/2022    Pt reports feeling nervous about dying. She asks me multiple times if she is going to die because her heart rate was high. Denies SOB, CP, syncope.  Questions and concerns discussed with her and her husband at bedside at time of encounter.    Objective   Vitals:   07/16/22 1930 08/03/2022 0015 07/30/2022 0347 07/22/2022 0500  BP: 109/79 102/84 105/80   Pulse: (!) 119 (!) 104 75   Resp: (!) '22 20 18   '$ Temp:  (!) 97.3 F (36.3 C) (!) 97.5 F (36.4 C)   TempSrc:   Oral   SpO2: 98% 98% 100%   Weight:    (!) 136.7 kg  Height:        Intake/Output Summary (Last 24 hours) at 07/20/2022 0728 Last data filed at 08/06/2022 0618 Gross per 24 hour  Intake 240 ml  Output 350 ml  Net -110 ml    Filed Weights   07/14/22 0820 07/16/22 0015 08/05/2022 0500  Weight: 132.2 kg 134.5 kg (!) 136.7 kg    ECG: afib, rate 123  Physical Exam:  General: awake, alert, NAD HEENT: atraumatic, clear conjunctiva, anicteric sclera, MMM, hearing grossly normal Respiratory: normal respiratory  effort. CTAB Cardiovascular: quick capillary refill, normal S1/S2, rapid rate, irregular  Nervous: A&O x3. no gross focal neurologic deficits, normal speech Extremities: moves all equally, no edema, normal tone Skin: dry, intact, normal temperature, normal color. No rashes, lesions or ulcers on exposed skin Psychiatry: anxious mood, congruent affect  Labs   I have personally reviewed the following labs and imaging studies CBC    Component Value Date/Time   WBC 13.1 (H) 07/16/2022 1501   RBC 4.23 07/16/2022 1501   HGB 10.8 (L) 07/16/2022 1501   HGB 12.0 01/18/2014 0617   HCT 38.0 07/16/2022 1501   HCT 36.8 01/18/2014 0617   PLT 386 07/16/2022 1501   PLT 310 01/18/2014 0617   MCV 89.8 07/16/2022 1501   MCV 88 01/18/2014 0617   MCH 25.5 (L) 07/16/2022 1501   MCHC 28.4 (L) 07/16/2022 1501   RDW 24.1 (H) 07/16/2022 1501   RDW 16.2 (H) 01/18/2014 0617   LYMPHSABS 1.6 07/08/2022 1924   LYMPHSABS 1.4 01/18/2014 0617   MONOABS 0.7 07/08/2022  1924   MONOABS 1.0 (H) 01/18/2014 0617   EOSABS 0.3 07/08/2022 1924   EOSABS 0.2 01/18/2014 0617   BASOSABS 0.1 07/08/2022 1924   BASOSABS 0.1 01/18/2014 0617      Latest Ref Rng & Units 07/16/2022    3:01 PM 07/15/2022   11:16 PM 07/13/2022    5:19 AM  BMP  Glucose 70 - 99 mg/dL 110  120  98   BUN 8 - 23 mg/dL 48  41  31   Creatinine 0.44 - 1.00 mg/dL 1.35  1.11  0.90   Sodium 135 - 145 mmol/L 139  141  141   Potassium 3.5 - 5.1 mmol/L 4.8  4.5  3.6   Chloride 98 - 111 mmol/L 93  98  96   CO2 22 - 32 mmol/L 33  34  35   Calcium 8.9 - 10.3 mg/dL 8.5  8.7  8.5    Disposition Plan & Communication  Patient status: Inpatient  Admitted From: SNF Planned disposition location: Skilled nursing facility Anticipated discharge date: 1/5 pending stabilization of HR on new medications  Family Communication: husband at bedside    Author: Richarda Osmond, DO [facility] 07/24/2022, 7:28 AM   Available by Epic secure chat 7AM-7PM. If  7PM-7AM, please contact night-coverage.  [facility] contact information found on [email or web address].

## 2022-07-17 NOTE — ED Notes (Signed)
Pt in xray and xray will bring to [facility] when exam complete.

## 2022-07-17 NOTE — H&P (Addendum)
History and Physical    Patient: Madison Mcbride BFX:[id number] DOB: 1955-11-07 DOA: 07/13/2022 DOS: the patient was seen and examined on 08/09/2022 PCP: Rusty Aus, MD  Patient coming from: SNF  Chief Complaint:  Chief Complaint  Patient presents with   Shortness of Breath   HPI: Madison Mcbride is a 67 y.o. female with medical history significant of thyroid cancer, hypothyroidism hyperlipidemia, Dm2, CKD stage 3, Pafib , CHFpEF, OSA on CPAP, was discharged earlier today to SNF and found to be tachycardic on intake and sent back to ED.  Pt husband states that she appeared diaphoretic and slightly dyspneic, and somnolent.   pt denies fever, chills, cough, cp, palp, n/v, abd pain, diarrhea, brbpr, black stool, dysuria, hematuria, flank pain .   In ED, T 97.9  P 110 R 26 Bp 114/72  pox 95% on 6L [place]  TSH 1.771 Na 135, K 5.2, Bun 63, Creat 1.61 Wbc 14.2, Hgb 11.4, Plt 410 Ph 7.23, pCo2 94  p O2 38 Procalcitonin 0.54  Trop 24-> 24 BNP 2,302.4  CXR FINDINGS: Transverse diameter of heart is increased. Central pulmonary vessels are less prominent. There is interval improvement in aeration in both lungs. There is residual increase in interstitial markings in parahilar regions and lower lung fields. There is increased density in left lower lung field partly obscuring the left hemidiaphragm. There is blunting of left lateral CP angle. There is no pneumothorax.   IMPRESSION: There is partial clearing of pulmonary edema. Residual increased density is seen in both lower lung fields, more so on the left side suggesting a small pleural effusions and underlying pulmonary edema or pneumonia, more so on the left side.  CTA chest FINDINGS: Cardiovascular: There is mild to moderate severity calcification of the aortic arch and descending thoracic aorta. The ascending thoracic aorta is dilated and measures 5.8 cm x 5.4 cm, proximally. Satisfactory opacification of the pulmonary  arteries to the segmental level. No evidence of pulmonary embolism. The main pulmonary artery measures 3.9 cm. There is moderate severity cardiomegaly with moderate severity coronary artery calcification. No pericardial effusion.   Mediastinum/Nodes: No enlarged mediastinal, hilar, or axillary lymph nodes. Thyroid gland, trachea, and esophagus demonstrate no significant findings.   Lungs/Pleura: Mild to moderate severity patchy ground-glass appearance of the lung parenchyma is noted.   Moderate to marked severity atelectasis and/or infiltrate is seen within the bilateral lower lobes.   There are small to moderate size bilateral pleural effusions.   No pneumothorax is identified.   Upper Abdomen: A 14 mm x 12 mm mildly hyperdense, exophytic lesion is seen along the lateral aspect of the upper pole of the right kidney (axial CT image 182, CT series 5).   Musculoskeletal: Multilevel degenerative changes seen throughout the thoracic spine.   Review of the MIP images confirms the above findings.   IMPRESSION: 1. No evidence of pulmonary embolism. 2. Moderate to marked severity bilateral lower lobe atelectasis and/or infiltrate. 3. Small to moderate size bilateral pleural effusions. 4. Moderate severity cardiomegaly with moderate severity coronary artery calcification. 5. Dilated ascending thoracic aorta. 6. Dilated main pulmonary artery which may represent pulmonary arterial hypertension. 7. 14 mm x 12 mm mildly hyperdense, exophytic lesion along the lateral aspect of the upper pole of the right kidney. While this may represent a hemorrhagic cyst, correlation with nonemergent renal ultrasound is recommended. 8. Aortic atherosclerosis.   Pt was given cardizem '10mg'$  iv x1 with slight benefit in ED, pt started on BIPAP for acute  respiratory failure with hypoxia and hypercapnea.  Pt is now hypotensive w sbp 70's and recommended admission to ICU, husband aware of pending admission  .    Review of Systems: negative for all 10 organ systems except for + above   Past Medical History:  Diagnosis Date   CHF (congestive heart failure) (HCC)    Diabetes mellitus without complication (HCC)    Hypothyroidism    Lymphedema    RLS (restless legs syndrome)    Sleep apnea    Thyroid cancer ([place]) 2007   Past Surgical History:  Procedure Laterality Date   ABDOMINAL HYSTERECTOMY     COLONOSCOPY WITH PROPOFOL N/A 08/26/2015   Procedure: COLONOSCOPY WITH PROPOFOL;  Surgeon: Manya Silvas, MD;  Location: [facility];  Service: Endoscopy;  Laterality: N/A;   THYROIDECTOMY     Social History:  reports that she has never smoked. She has never used smokeless tobacco. She reports that she does not drink alcohol and does not use drugs.  No Known Allergies  Family History  Problem Relation Age of Onset   Autoimmune disease Sister    Breast cancer Neg Hx     Prior to Admission medications   Medication Sig Start Date End Date Taking? Authorizing Provider  acetaminophen (TYLENOL) 650 MG CR tablet Take 1,300 mg by mouth every 8 (eight) hours as needed for pain.   Yes [provider]  ALPRAZolam (XANAX) 0.25 MG tablet Take 1 tablet (0.25 mg total) by mouth 3 (three) times daily as needed for anxiety. 05/29/22  Yes Sreenath, Trula Slade, MD  apixaban (ELIQUIS) 5 MG TABS tablet Take 1 tablet (5 mg total) by mouth 2 (two) times daily. 07/25/21  Yes Dessa Phi, DO  atorvastatin (LIPITOR) 20 MG tablet Take 20 mg by mouth daily.   Yes [provider]  Biotin 5 MG TABS Take 2.5 mg by mouth daily.   Yes [provider]  buPROPion (WELLBUTRIN XL) 150 MG 24 hr tablet Take 150 mg by mouth daily.   Yes [provider]  Catheter Self-Adhesive Urinary MISC Continue PurWIck prn if available 03/18/22  Yes Emeterio Reeve, DO  Cholecalciferol 25 MCG (1000 UT) tablet Take 1 tablet by mouth daily.   Yes [provider]  cyclobenzaprine (FLEXERIL) 5  MG tablet Take 5 mg by mouth 3 (three) times daily as needed for muscle spasms.   Yes [provider]  diltiazem (CARDIZEM) 60 MG tablet Take 1 tablet (60 mg total) by mouth every 8 (eight) hours. 08/05/2022  Yes Richarda Osmond, MD  furosemide (LASIX) 40 MG tablet Take 1 tablet (40 mg total) by mouth 2 (two) times daily. Increase to 1 tablet (40 mg total) by mouth THREE TIMES daily (total daily dose 120 mg) as needed for up to 3 days for increased leg swelling, shortness of breath, weight gain 5+ lbs over 1-2 days. Seek medical care if these symptoms are not improving with increased dose. Reduce dose to 1 tablet (40 mg total) by mouth ONCE DAILY if dizziness or low blood pressure on bid dose. 03/18/22  Yes Emeterio Reeve, DO  gabapentin (NEURONTIN) 100 MG capsule Take 2 capsules (200 mg total) by mouth 3 (three) times daily. 05/29/22  Yes Sreenath, Sudheer B, MD  Iron, Ferrous Sulfate, 325 (65 Fe) MG TABS Take 325 mg by mouth daily at 12 noon. 05/29/22  Yes Sreenath, Sudheer B, MD  levothyroxine (SYNTHROID) 125 MCG tablet Take 2 tablets (250 mcg total) by mouth daily. Takes two  tablets for a total of 250 mcg daily. 07/15/22  Yes Richarda Osmond, MD  metolazone (ZAROXOLYN) 2.5 MG tablet Take 2.5 mg by mouth daily as needed (if weight up > 5 pounds).   Yes [provider]  Metoprolol Tartrate 75 MG TABS Take 75 mg by mouth 2 (two) times daily. 07/18/2022  Yes Richarda Osmond, MD  Mouthwashes (MOUTH RINSE) LIQD solution 15 mLs by Mouth Rinse route as needed (oral care). 03/18/22  Yes Emeterio Reeve, DO  Multiple Vitamin (MULTIVITAMIN) tablet Take 1 tablet by mouth daily.   Yes [provider]  naloxone (NARCAN) nasal spray 4 mg/0.1 mL Place 1 spray into the nose as needed (opioid overdose).   Yes [provider]  pantoprazole (PROTONIX) 40 MG tablet Take 40 mg by mouth daily. 01/11/21  Yes [provider]  polyethylene glycol (MIRALAX / GLYCOLAX) 17 g packet  Take 17 g by mouth daily as needed for moderate constipation. 03/18/22  Yes Emeterio Reeve, DO  potassium chloride (KLOR-CON) 20 MEQ packet Take 20 mEq by mouth daily.   Yes [provider]  rOPINIRole (REQUIP) 3 MG tablet Take 1 tablet (3 mg total) by mouth at bedtime. 03/18/22  Yes Emeterio Reeve, DO  Semaglutide, 1 MG/DOSE, 2 MG/1.5ML SOPN Inject 0.75 mLs into the skin once a week. 10/14/20  Yes [provider]  traMADol (ULTRAM) 50 MG tablet Take 50 mg by mouth every 6 (six) hours as needed for moderate pain.   Yes [provider]  zinc gluconate 50 MG tablet Take 50 mg by mouth daily.   Yes [provider]    Physical Exam: Vitals:   07/30/2022 2015 07/19/2022 2030 08/06/2022 2031 07/19/2022 2045  BP: (!) 93/59 94/70  (!) 83/32  Pulse: 99 95 96 98  Resp: (!) '22 15 14 '$ (!) 26  Temp:      TempSrc:      SpO2: 96% 96% 95% 90%   axox3 Heent: anicteric Neck: no jvd Heart: irr, irr, s1, s2, no m/g/r Lung: decrease bs at bilateral base, no crackle, no wheeze Abd: soft, morbidly obese, nt, nd, +bs Ext: no c/c/ 1+ edema,  + venous stasis changes in bilateral lower ext Neuro: cn2-12 intact, reflexes 2+ symmetric, diffuse with no clonus, motor 5/5 in all 4 ext   Data Reviewed:  Assessment and Plan:  Afib with RVR Tele Cycle trop Amiodarone gtt due to hypotension Cont metoprolol '75mg'$  po bid if bp will tolerate Cont Eliquis '5mg'$  po bid Check cardiac echo  (last echo 03/06/22)  Hypotension  w elevated troponin (chronic elevation, likely demand ischemia) DDX ?Afib with RVR,  ?early sepsis, ?dehydration Hold Xanax or benzos, Avoid pain medication Hold Gabapentin Hold Flexeril  Hold Ropinirole Hold Cardizem Blood culture x2 Lactic acid Check cortisol Cycle trop Check cardiac echo as above   Start Abx broad spectrum to cover for early sepsis Consult critical care regarding admission to ICU due to high risk of decompensation  Acute respiratory  failure with hypoxia and hypercapnea Bipap per respiratory protocol Consult critical care regarding admission due to high risk of intubation  Hx of CHF p EF Hold Lasix temporarily due to hypotension, pt doesn't appear clinically to be in active failure   AMS secondary to Co2 AVOID Xanax per husband, AVOID Tramadol per husband  Hyperkalemia STOP potassium  DM2 Fsbs q4h, ISS  Hypothyroidism Cont Levothyroxine  Gerd Cont Protonix  DVT prophylaxis: Eliquis FULL CODE Dispo: home  Pt will be admitted inpatient >  2 nites stay for acute respiratory failure with hypoxia and hypercapnea, and Afib with RVR, and hypotension, ? Early sepsis     Advance Care Planning:   Code Status: Prior FULL CODE  Consults: Critical care  Family Communication: w husband  Severity of Illness: The appropriate patient status for this patient is INPATIENT. Inpatient status is judged to be reasonable and necessary in order to provide the required intensity of service to ensure the patient's safety. The patient's presenting symptoms, physical exam findings, and initial radiographic and laboratory data in the context of their chronic comorbidities is felt to place them at high risk for further clinical deterioration. Furthermore, it is not anticipated that the patient will be medically stable for discharge from the hospital within 2 midnights of admission.   * I certify that at the point of admission it is my clinical judgment that the patient will require inpatient hospital care spanning beyond 2 midnights from the point of admission due to high intensity of service, high risk for further deterioration and high frequency of surveillance required.*  Author: Jani Gravel, MD 07/20/2022 8:55 PM  For on call review [email or web address].

## 2022-07-17 NOTE — Consult Note (Incomplete)
Initial Consultation Note   Patient: Madison Mcbride FGH:[id number] DOB: 1956-06-23 PCP: Rusty Aus, MD DOA: 07/16/2022 DOS: the patient was seen and examined on 08/01/2022 Primary service: Rada Hay, MD  Referring physician: Acquanetta Belling Reason for consult: Afib with RVR  Assessment/Plan:   Assessment and Plan:   {TRH_Follow_Sign_off:26779}  HPI: Madison Mcbride is a 67 y.o. female with past medical history of ***. Afib with RVR Tele Cycle trop Check TSH if none recently  Check cardiac echo Cardizem gtt  Hypotension Tele  Cycle trop  Recommend check cortisol Recommend higher level of care , ICU  Acute respiratory failure with hypoxia and hypercapnea (Co2=94) Pt appears very tenuous, at high risk for intubation Recommend higher level of care, ICU  Leukocytosis, Anemia, Acidosis (PH 7.23), Tachycardia (afib with RVR) Possibly early sepsis  Rec Blood culture x2, lactic acid, procalcitonin and coverage with broad spectrum abx (vanco cefepime)    Review of Systems: {ROS_Text:26778} Past Medical History:  Diagnosis Date   CHF (congestive heart failure) (HCC)    Diabetes mellitus without complication (HCC)    Hypothyroidism    Lymphedema    RLS (restless legs syndrome)    Sleep apnea    Thyroid cancer ([place]) 2007   Past Surgical History:  Procedure Laterality Date   ABDOMINAL HYSTERECTOMY     COLONOSCOPY WITH PROPOFOL N/A 08/26/2015   Procedure: COLONOSCOPY WITH PROPOFOL;  Surgeon: Manya Silvas, MD;  Location: [facility] ENDOSCOPY;  Service: Endoscopy;  Laterality: N/A;   THYROIDECTOMY     Social History:  reports that she has never smoked. She has never used smokeless tobacco. She reports that she does not drink alcohol and does not use drugs.  No Known Allergies  Family History  Problem Relation Age of Onset   Autoimmune disease Sister    Breast cancer Neg Hx     Prior to Admission medications   Medication Sig Start Date End Date  Taking? Authorizing Provider  apixaban (ELIQUIS) 5 MG TABS tablet Take 1 tablet (5 mg total) by mouth 2 (two) times daily. 07/25/21  Yes Dessa Phi, DO  atorvastatin (LIPITOR) 20 MG tablet Take 20 mg by mouth daily.   Yes [provider]  buPROPion (WELLBUTRIN XL) 150 MG 24 hr tablet Take 150 mg by mouth daily.   Yes [provider]  Cholecalciferol 25 MCG (1000 UT) tablet Take 1 tablet by mouth daily.   Yes [provider]  cyclobenzaprine (FLEXERIL) 5 MG tablet Take 5 mg by mouth 3 (three) times daily as needed for muscle spasms.   Yes [provider]  diltiazem (CARDIZEM) 60 MG tablet Take 1 tablet (60 mg total) by mouth every 8 (eight) hours. 08/04/2022  Yes Richarda Osmond, MD  furosemide (LASIX) 40 MG tablet Take 1 tablet (40 mg total) by mouth 2 (two) times daily. Increase to 1 tablet (40 mg total) by mouth THREE TIMES daily (total daily dose 120 mg) as needed for up to 3 days for increased leg swelling, shortness of breath, weight gain 5+ lbs over 1-2 days. Seek medical care if these symptoms are not improving with increased dose. Reduce dose to 1 tablet (40 mg total) by mouth ONCE DAILY if dizziness or low blood pressure on bid dose. 03/18/22  Yes Emeterio Reeve, DO  gabapentin (NEURONTIN) 100 MG capsule Take 2 capsules (200 mg total) by mouth 3 (three) times daily. 05/29/22  Yes Sreenath, Sudheer B, MD  Iron, Ferrous Sulfate, 325 (65 Fe) MG TABS  Take 325 mg by mouth daily at 12 noon. 05/29/22  Yes Sreenath, Sudheer B, MD  levothyroxine (SYNTHROID) 125 MCG tablet Take 2 tablets (250 mcg total) by mouth daily. Takes two tablets for a total of 250 mcg daily. 07/15/22  Yes Richarda Osmond, MD  Metoprolol Tartrate 75 MG TABS Take 75 mg by mouth 2 (two) times daily. 07/31/2022  Yes Richarda Osmond, MD  Multiple Vitamin (MULTIVITAMIN) tablet Take 1 tablet by mouth daily.   Yes [provider]  pantoprazole (PROTONIX) 40 MG tablet Take 40 mg by mouth  daily. 01/11/21  Yes [provider]  rOPINIRole (REQUIP) 3 MG tablet Take 1 tablet (3 mg total) by mouth at bedtime. 03/18/22  Yes Emeterio Reeve, DO  acetaminophen (TYLENOL) 650 MG CR tablet Take 1,300 mg by mouth every 8 (eight) hours as needed for pain.    [provider]  ALPRAZolam Duanne Moron) 0.25 MG tablet Take 1 tablet (0.25 mg total) by mouth 3 (three) times daily as needed for anxiety. 05/29/22   Sidney Ace, MD  Biotin 5 MG TABS Take 2.5 mg by mouth daily.    [provider]  Catheter Self-Adhesive Urinary MISC Continue PurWIck prn if available 03/18/22   Emeterio Reeve, DO  metolazone (ZAROXOLYN) 2.5 MG tablet Take 2.5 mg by mouth daily as needed (if weight up > 5 pounds).    [provider]  Mouthwashes (MOUTH RINSE) LIQD solution 15 mLs by Mouth Rinse route as needed (oral care). 03/18/22   Emeterio Reeve, DO  naloxone [facility]) nasal spray 4 mg/0.1 mL Place 1 spray into the nose once.    [provider]  polyethylene glycol (MIRALAX / GLYCOLAX) 17 g packet Take 17 g by mouth daily as needed for moderate constipation. 03/18/22   Emeterio Reeve, DO  potassium chloride (KLOR-CON) 20 MEQ packet Take 20 mEq by mouth daily.    [provider]  Semaglutide, 1 MG/DOSE, 2 MG/1.5ML SOPN Inject 0.75 mLs into the skin once a week. 10/14/20   [provider]  traMADol (ULTRAM) 50 MG tablet Take 50 mg by mouth every 6 (six) hours as needed for moderate pain.    [provider]  zinc gluconate 50 MG tablet Take 50 mg by mouth daily.    [provider]    Physical Exam: Vitals:   07/19/2022 1900 07/26/2022 2015 08/07/2022 2030 08/04/2022 2031  BP: 121/83 (!) 93/59 94/70   Pulse: 98 99 95 96  Resp: (!) 21 (!) '22 15 14  '$ Temp:      TempSrc:      SpO2: 99% 96% 96% 95%   *** Data Reviewed:  {Tip this will not be part of the note when signed- Document your independent interpretation of telemetry tracing, EKG, lab,  Radiology test or any other diagnostic tests. Add any new diagnostic test ordered today. (Optional):26781} {Results:26384}  {Tip this will not be part of the note when signed  DVT Prophylaxis  .,  (Optional):26781}  Family Communication: *** Primary team communication: *** Thank you very much for involving [place] in the care of your patient.  Author: Jani Gravel, MD 07/27/2022 8:45 PM  For on call review [email or web address].

## 2022-07-17 NOTE — Progress Notes (Signed)
Pt transported from ER 17 to ICU 13 via BIPAP with out incident.

## 2022-07-17 NOTE — ED Provider Triage Note (Signed)
  Emergency Medicine Provider Triage Evaluation Note  [facility] , a 67 y.o.female,  was evaluated in triage.  Pt complains of chest pain/shortness of breath.  She states that she was recently discharged earlier today after undergoing a urinary tract infection and struggling with elevated heart rate.  She states that since discharge, she has had persistent chest pain and shortness of breath.  Both her and her husband state that they do not feel like they should have been discharged yet.   Review of Systems  Positive: Chest pain, shortness of breath. Negative: Denies fever, abdominal pain, vomiting  Physical Exam   Vitals:   07/23/2022 1522  BP: 114/72  Pulse: (!) 110  Resp: 18  Temp: 97.9 F (36.6 C)  SpO2: 95%   Gen:   Awake, appears anxious. Resp:  Labored effort. MSK:   Moves extremities without difficulty  Other:    Medical Decision Making  Given the patient's initial medical screening exam, the following diagnostic evaluation has been ordered. The patient will be placed in the appropriate treatment space, once one is available, to complete the evaluation and treatment. I have discussed the plan of care with the patient and I have advised the patient that an ED physician or mid-level practitioner will reevaluate their condition after the test results have been received, as the results may give them additional insight into the type of treatment they may need.    Diagnostics: Labs, EKG, CXR  Treatments: none immediately   Teodoro Spray, [place] 07/24/2022 1533

## 2022-07-18 ENCOUNTER — Other Ambulatory Visit: Payer: Self-pay

## 2022-07-18 DIAGNOSIS — J9622 Acute and chronic respiratory failure with hypercapnia: Secondary | ICD-10-CM

## 2022-07-18 DIAGNOSIS — J9621 Acute and chronic respiratory failure with hypoxia: Secondary | ICD-10-CM

## 2022-07-18 DIAGNOSIS — E1140 Type 2 diabetes mellitus with diabetic neuropathy, unspecified: Secondary | ICD-10-CM

## 2022-07-18 LAB — RESPIRATORY PANEL BY PCR

## 2022-07-18 LAB — BLOOD GAS, VENOUS
Acid-Base Excess: 8.3 mmol/L — ABNORMAL HIGH (ref 0.0–2.0)
Bicarbonate: 36.9 mmol/L — ABNORMAL HIGH (ref 20.0–28.0)
O2 Saturation: 91.7 %
Patient temperature: 37
pCO2, Ven: 70 mmHg — ABNORMAL HIGH (ref 44–60)
pH, Ven: 7.33 (ref 7.25–7.43)
pO2, Ven: 70 mmHg — ABNORMAL HIGH (ref 32–45)

## 2022-07-18 LAB — HEPARIN LEVEL (UNFRACTIONATED): Heparin Unfractionated: >1.1 IU/mL — ABNORMAL HIGH (ref 0.30–0.70)

## 2022-07-18 LAB — BASIC METABOLIC PANEL
Anion gap: 12 (ref 5–15)
Anion gap: 16 — ABNORMAL HIGH (ref 5–15)
BUN: 66 mg/dL — ABNORMAL HIGH (ref 8–23)
BUN: 67 mg/dL — ABNORMAL HIGH (ref 8–23)
CO2: 31 mmol/L (ref 22–32)
CO2: 28 mmol/L (ref 22–32)
Calcium: 8.2 mg/dL — ABNORMAL LOW (ref 8.9–10.3)
Calcium: 8.2 mg/dL — ABNORMAL LOW (ref 8.9–10.3)
Chloride: 96 mmol/L — ABNORMAL LOW (ref 98–111)
Chloride: 96 mmol/L — ABNORMAL LOW (ref 98–111)
Creatinine, Ser: 1.67 mg/dL — ABNORMAL HIGH (ref 0.44–1.00)
Creatinine, Ser: 1.69 mg/dL — ABNORMAL HIGH (ref 0.44–1.00)
GFR, Estimated: 33 mL/min — ABNORMAL LOW (ref >60)
GFR, Estimated: 33 mL/min — ABNORMAL LOW (ref >60)
Glucose, Bld: 96 mg/dL (ref 70–99)
Glucose, Bld: 78 mg/dL (ref 70–99)
Potassium: 4.6 mmol/L (ref 3.5–5.1)
Potassium: 4.6 mmol/L (ref 3.5–5.1)
Sodium: 139 mmol/L (ref 135–145)
Sodium: 140 mmol/L (ref 135–145)

## 2022-07-18 LAB — LACTIC ACID, PLASMA
Lactic Acid, Venous: 2.9 mmol/L (ref 0.5–1.9)
Lactic Acid, Venous: 3.7 mmol/L (ref 0.5–1.9)

## 2022-07-18 LAB — CBC
HCT: 39.1 % (ref 36.0–46.0)
Hemoglobin: 11.1 g/dL — ABNORMAL LOW (ref 12.0–15.0)
MCH: 25.5 pg — ABNORMAL LOW (ref 26.0–34.0)
MCHC: 28.4 g/dL — ABNORMAL LOW (ref 30.0–36.0)
MCV: 89.7 fL (ref 80.0–100.0)
Platelets: 431 10*3/uL — ABNORMAL HIGH (ref 150–400)
RBC: 4.36 MIL/uL (ref 3.87–5.11)
RDW: 23.9 % — ABNORMAL HIGH (ref 11.5–15.5)
WBC: 13.6 10*3/uL — ABNORMAL HIGH (ref 4.0–10.5)
nRBC: 1 % — ABNORMAL HIGH (ref 0.0–0.2)

## 2022-07-18 LAB — BLOOD GAS, ARTERIAL
Acid-Base Excess: 5.9 mmol/L — ABNORMAL HIGH (ref 0.0–2.0)
Bicarbonate: 35.9 mmol/L — ABNORMAL HIGH (ref 20.0–28.0)
Delivery systems: POSITIVE
Expiratory PAP: 8 cmH2O
FIO2: 45 %
Inspiratory PAP: 22 cmH2O
O2 Saturation: 99.3 %
Patient temperature: 37
RATE: 8 resp/min
pCO2 arterial: 80 mmHg (ref 32–48)
pH, Arterial: 7.26 — ABNORMAL LOW (ref 7.35–7.45)
pO2, Arterial: 124 mmHg — ABNORMAL HIGH (ref 83–108)

## 2022-07-18 LAB — MAGNESIUM: Magnesium: 2.6 mg/dL — ABNORMAL HIGH (ref 1.7–2.4)

## 2022-07-18 LAB — PROTIME-INR
INR: 3.6 — ABNORMAL HIGH (ref 0.8–1.2)
Prothrombin Time: 35.4 seconds — ABNORMAL HIGH (ref 11.4–15.2)

## 2022-07-18 LAB — CORTISOL: Cortisol, Plasma: 20.2 ug/dL

## 2022-07-18 LAB — HIV ANTIBODY (ROUTINE TESTING W REFLEX): HIV Screen 4th Generation wRfx: NONREACTIVE

## 2022-07-18 LAB — PHOSPHORUS: Phosphorus: 7 mg/dL — ABNORMAL HIGH (ref 2.5–4.6)

## 2022-07-18 LAB — APTT
aPTT: 176 seconds (ref 24–36)
aPTT: 63 seconds — ABNORMAL HIGH (ref 24–36)

## 2022-07-18 LAB — MRSA NEXT GEN BY PCR, NASAL: MRSA by PCR Next Gen: NOT DETECTED

## 2022-07-18 LAB — STREP PNEUMONIAE URINARY ANTIGEN: Strep Pneumo Urinary Antigen: NEGATIVE

## 2022-07-18 MED ORDER — AMIODARONE IV BOLUS ONLY 150 MG/100ML
150.0000 mg | Freq: Once | INTRAVENOUS | Status: AC
  Administered 2022-07-18: 150 mg via INTRAVENOUS
  Filled 2022-07-18: qty 100

## 2022-07-18 MED ORDER — HEPARIN (PORCINE) 25000 UT/250ML-% IV SOLN
1900.0000 [IU]/h | INTRAVENOUS | Status: DC
  Administered 2022-07-18: 1350 [IU]/h via INTRAVENOUS
  Administered 2022-07-19: 1500 [IU]/h via INTRAVENOUS
  Filled 2022-07-18 (×3): qty 250

## 2022-07-18 MED ORDER — HEPARIN BOLUS VIA INFUSION
5000.0000 [IU] | Freq: Once | INTRAVENOUS | Status: AC
  Administered 2022-07-18: 5000 [IU] via INTRAVENOUS
  Filled 2022-07-18: qty 5000

## 2022-07-18 MED ORDER — IPRATROPIUM-ALBUTEROL 0.5-2.5 (3) MG/3ML IN SOLN
3.0000 mL | Freq: Four times a day (QID) | RESPIRATORY_TRACT | Status: DC
  Administered 2022-07-18 – 2022-07-20 (×11): 3 mL via RESPIRATORY_TRACT
  Filled 2022-07-18 (×11): qty 3

## 2022-07-18 MED ORDER — HEPARIN BOLUS VIA INFUSION
1300.0000 [IU] | Freq: Once | INTRAVENOUS | Status: AC
  Administered 2022-07-18: 1300 [IU] via INTRAVENOUS
  Filled 2022-07-18: qty 1300

## 2022-07-18 MED ORDER — SODIUM CHLORIDE 0.9 % IV SOLN
1.0000 g | INTRAVENOUS | Status: DC
  Filled 2022-07-18: qty 10

## 2022-07-18 MED ORDER — SODIUM CHLORIDE 0.9 % IV SOLN
500.0000 mg | INTRAVENOUS | Status: DC
  Administered 2022-07-18: 500 mg via INTRAVENOUS
  Filled 2022-07-18: qty 500
  Filled 2022-07-18: qty 5

## 2022-07-18 MED ORDER — BUDESONIDE 0.25 MG/2ML IN SUSP
0.2500 mg | Freq: Two times a day (BID) | RESPIRATORY_TRACT | Status: DC
  Administered 2022-07-18: 0.25 mg via RESPIRATORY_TRACT
  Filled 2022-07-18: qty 2

## 2022-07-18 MED ORDER — PIPERACILLIN-TAZOBACTAM 3.375 G IVPB
3.3750 g | Freq: Three times a day (TID) | INTRAVENOUS | Status: DC
  Administered 2022-07-18 – 2022-07-21 (×9): 3.375 g via INTRAVENOUS
  Filled 2022-07-18 (×9): qty 50

## 2022-07-18 MED ORDER — IPRATROPIUM-ALBUTEROL 0.5-2.5 (3) MG/3ML IN SOLN: 3.0000 mL | Freq: Four times a day (QID) | RESPIRATORY_TRACT | Status: DC | PRN

## 2022-07-18 MED ORDER — FUROSEMIDE 10 MG/ML IJ SOLN
40.0000 mg | Freq: Two times a day (BID) | INTRAMUSCULAR | Status: DC
  Administered 2022-07-18 – 2022-07-23 (×10): 40 mg via INTRAVENOUS
  Filled 2022-07-18 (×10): qty 4

## 2022-07-18 NOTE — H&P (Incomplete)
NAME:  Madison Mcbride, MRN:  [id number], DOB:  08/31/1955, LOS: 0 ADMISSION DATE:  07/24/2022, CONSULTATION DATE:  *** REFERRING MD:  ***, CHIEF COMPLAINT:  ***    HPI  67 year old F with PMH of A-fib on Eliquis, diastolic CHF, DM-2, HTN, HLD, hypothyroidism, thyroid cancer, anxiety, depression, morbid obesity, chronic back pain, OSA on CPAP and CKD-3A who presented to the ED with c/o SOB and tachycardia.  Patient [facility] ED [facility]/03/2022 from a skilled nursing facility with c/o tachycardia with HR in 130s. WBC 12.6.  UA concerning for UTI.  COVID-19, influenza and RSV PCR nonreactive. CXR with cardiomegaly without infiltration or edema. Cultures was sent and patient was started on IV meropenem due to hx of ESBL, and IV Cardizem for A fib w/RVR. Due to not having a urine culture collected on presentation, and a h/o ESBL infection, she completed a full course of empiric IV meropenem (completed January 2). She was transitioned to PO metoprolol. Patient was scheduled to be discharged back to SNF on 07/15/22 but unfortunately she went back into Afib with RVR which was thought to be due to missed her morning dose of metoprolol. SNF refused to accept her so discharge was cancelled. Cardiology was consulted with recommendation to increase metoprolol from 50 mg to 75 mg BID and Cardizem 60 mg p.o. added q 8hr. Patient was discharged to SNF today unfortunately approximately two hours after discharge she was sent back to the ED with tachycardia and shortness of breath.   ED Course: Initial vital signs showed HR of beats/minute, BP mm Hg, the RR 30 breaths/minute, and the oxygen saturation % on and a temperature of 98.63F (36.9C).  Pertinent Labs/Diagnostics Findings: Chemistry:Na+/ K+:  Glucose: BUN/Cr.:Calcium:  AST/ALT: CBC: WBC: Hgb/Hct: Plts:  Other Lab findings:   PCT: negative <0.10 Lactic acid:  COVID PCR: Negative, Troponin:  BNP:   Arterial Blood Gas result:  pO2 ***; pCO2 ***; pH ***;   HCO3 ***, %O2 Sat ***.  Imaging:  CXR> CTH> CTA Chest> CT Abd/pelvis> Medications Administered:   Past Medical History  ***  Significant Hospital Events   ***  Consults:  ***  Procedures:  ***  Significant Diagnostic Tests:  : Chest Xray> : Abdominal xray> : Noncontrast CT head> : CTA abdomen and pelvis> : CTA Chest>  Interim History / Subjective:    Micro Data:  : SARS-CoV-2 PCR> negative : Influenza PCR> negative : Blood culture x2> : Urine Culture> : MRSA PCR>>  : Strep pneumo urinary antigen> : Legionella urinary antigen> : Mycoplasma pneumonia>  Antimicrobials:  Vancomycin Cefepime Azithromycin Ceftriaxone Metronidazole  OBJECTIVE  Blood pressure (!) 76/58, pulse 99, temperature 97.6 F (36.4 C), temperature source Oral, resp. rate (!) 23, SpO2 97 %.    FiO2 (%):  [45 %] 45 %  No intake or output data in the 24 hours ending 08/10/2022 2132 There were no vitals filed for this visit.   Physical Examination  GENERAL: year-old critically ill patient lying in the bed with no acute distress.  EYES: Pupils equal, round, reactive to light and accommodation. No scleral icterus. Extraocular muscles intact.  HEENT: Head atraumatic, normocephalic. Oropharynx and nasopharynx clear.  NECK:  Supple, no jugular venous distention. No thyroid enlargement, no tenderness.  LUNGS: Normal breath sounds bilaterally, no wheezing, rales,rhonchi or crepitation. No use of accessory muscles of respiration.  CARDIOVASCULAR: S1, S2 normal. No murmurs, rubs, or gallops.  ABDOMEN: Soft, nontender, nondistended. Bowel sounds present. No organomegaly or mass.  EXTREMITIES: Upper and lower extremities are atraumatic in appearance without tenderness or deformity. No swelling or erythema. Full range of motion is noted to all joints. Muscle strength is 5/5 bilaterally. Tendon function is normal. Capillary refill is less than 3 seconds in all extremities. Pulses palpable.  NEUROLOGIC:The  patient is awake, alert and oriented to person, place, and time with normal speech. Motor function is normal with muscle strength 5/5 bilaterally to upper and lower extremities. Sensation is intact bilaterally. Reflexes 2+ bilaterally. Cranial nerves are intact. Cerebellar function is intact. Memory is normal and thought process is intact. Gait not checked.  PSYCHIATRIC: Appropriate mood and affect. The patient is  SKIN: No obvious rash, lesion, or ulcer.   Labs/imaging that I {ACTIONS; HAVE/HAVE NOT:19434}personally reviewed  (right click and "Reselect all SmartList Selections" daily)     Labs   CBC: Recent Labs  Lab 07/11/22 0547 07/13/22 0519 07/14/22 0612 07/16/22 1501 07/22/2022 1523  WBC 11.8* 13.9* 13.6* 13.1* 14.2*  HGB 10.3* 10.6* 10.7* 10.8* 11.4*  HCT 35.5* 37.3 38.4 38.0 41.1  MCV 86.6 89.2 89.7 89.8 91.7  PLT 279 311 337 386 410*    Basic Metabolic Panel: Recent Labs  Lab 07/11/22 0547 07/13/22 0519 07/15/22 2316 07/16/22 1501 07/19/2022 1523  NA 141 141 141 139 135  K 3.6 3.6 4.5 4.8 5.2*  CL 99 96* 98 93* 94*  CO2 35* 35* 34* 33* 28  GLUCOSE 129* 98 120* 110* 154*  BUN 43* 31* 41* 48* 63*  CREATININE 1.13* 0.90 1.11* 1.35* 1.61*  CALCIUM 9.0 8.5* 8.7* 8.5* 8.4*  MG 2.1  --  2.3  --   --   PHOS 3.0  --   --   --   --    GFR: Estimated Creatinine Clearance: 46.2 mL/min (A) (by C-G formula based on SCr of 1.61 mg/dL (H)). Recent Labs  Lab 07/13/22 0519 07/14/22 0612 07/16/22 1501 07/14/2022 1523  PROCALCITON  --   --   --  0.54  WBC 13.9* 13.6* 13.1* 14.2*    Liver Function Tests: Recent Labs  Lab 07/11/22 0547 07/16/22 1501  AST  --  32  ALT  --  13  ALKPHOS  --  108  BILITOT  --  1.7*  PROT  --  7.3  ALBUMIN 3.0* 3.2*   No results for input(s): "LIPASE", "AMYLASE" in the last 168 hours. No results for input(s): "AMMONIA" in the last 168 hours.  ABG    Component Value Date/Time   PHART 7.43 03/11/2022 0459   PCO2ART 67 (HH) 03/11/2022  0459   PO2ART 84 03/11/2022 0459   HCO3 39.4 (H) 07/27/2022 1808   O2SAT 39.8 08/03/2022 1808     Coagulation Profile: No results for input(s): "INR", "PROTIME" in the last 168 hours.  Cardiac Enzymes: No results for input(s): "CKTOTAL", "CKMB", "CKMBINDEX", "TROPONINI" in the last 168 hours.  HbA1C: Hgb A1c MFr Bld  Date/Time Value Ref Range Status  07/08/2022 07:24 PM 5.3 4.8 - 5.6 % Final    Comment:    (NOTE)         Prediabetes: 5.7 - 6.4         Diabetes: >6.4         Glycemic control for adults with diabetes: <7.0   03/06/2022 04:04 AM 6.4 (H) 4.8 - 5.6 % Final    Comment:    (NOTE) Pre diabetes:          5.7%-6.4%  Diabetes:              >  6.4%  Glycemic control for   <7.0% adults with diabetes     CBG: Recent Labs  Lab 07/15/22 0436 07/15/22 2232 07/16/22 1356 07/16/22 2035  GLUCAP 118* 110* 102* 136*    Review of Systems:   ***  Past Medical History  She,  has a past medical history of CHF (congestive heart failure) ([place]), Diabetes mellitus without complication ([place]), Hypothyroidism, Lymphedema, RLS (restless legs syndrome), Sleep apnea, and Thyroid cancer ([place]) (2007).   Surgical History    Past Surgical History:  Procedure Laterality Date  . ABDOMINAL HYSTERECTOMY    . COLONOSCOPY WITH PROPOFOL N/A 08/26/2015   Procedure: COLONOSCOPY WITH PROPOFOL;  Surgeon: Manya Silvas, MD;  Location: [facility] ENDOSCOPY;  Service: Endoscopy;  Laterality: N/A;  . THYROIDECTOMY       Social History   reports that she has never smoked. She has never used smokeless tobacco. She reports that she does not drink alcohol and does not use drugs.   Family History   Her family history includes Autoimmune disease in her sister. There is no history of Breast cancer.   Allergies No Known Allergies   Home Medications  Prior to Admission medications   Medication Sig Start Date End Date Taking? Authorizing Provider  acetaminophen (TYLENOL) 650 MG CR tablet Take 1,300  mg by mouth every 8 (eight) hours as needed for pain.   Yes [provider]  ALPRAZolam (XANAX) 0.25 MG tablet Take 1 tablet (0.25 mg total) by mouth 3 (three) times daily as needed for anxiety. 05/29/22  Yes Sreenath, Trula Slade, MD  apixaban (ELIQUIS) 5 MG TABS tablet Take 1 tablet (5 mg total) by mouth 2 (two) times daily. 07/25/21  Yes Dessa Phi, DO  atorvastatin (LIPITOR) 20 MG tablet Take 20 mg by mouth daily.   Yes [provider]  Biotin 5 MG TABS Take 2.5 mg by mouth daily.   Yes [provider]  buPROPion (WELLBUTRIN XL) 150 MG 24 hr tablet Take 150 mg by mouth daily.   Yes [provider]  Catheter Self-Adhesive Urinary MISC Continue PurWIck prn if available 03/18/22  Yes Emeterio Reeve, DO  Cholecalciferol 25 MCG (1000 UT) tablet Take 1 tablet by mouth daily.   Yes [provider]  cyclobenzaprine (FLEXERIL) 5 MG tablet Take 5 mg by mouth 3 (three) times daily as needed for muscle spasms.   Yes [provider]  diltiazem (CARDIZEM) 60 MG tablet Take 1 tablet (60 mg total) by mouth every 8 (eight) hours. 08/08/2022  Yes Richarda Osmond, MD  furosemide (LASIX) 40 MG tablet Take 1 tablet (40 mg total) by mouth 2 (two) times daily. Increase to 1 tablet (40 mg total) by mouth THREE TIMES daily (total daily dose 120 mg) as needed for up to 3 days for increased leg swelling, shortness of breath, weight gain 5+ lbs over 1-2 days. Seek medical care if these symptoms are not improving with increased dose. Reduce dose to 1 tablet (40 mg total) by mouth ONCE DAILY if dizziness or low blood pressure on bid dose. 03/18/22  Yes Emeterio Reeve, DO  gabapentin (NEURONTIN) 100 MG capsule Take 2 capsules (200 mg total) by mouth 3 (three) times daily. 05/29/22  Yes Sreenath, Sudheer B, MD  Iron, Ferrous Sulfate, 325 (65 Fe) MG TABS Take 325 mg by mouth daily at 12 noon. 05/29/22  Yes Sreenath, Sudheer B, MD  levothyroxine (SYNTHROID) 125 MCG tablet  Take 2 tablets (250 mcg total) by mouth daily. Takes two  tablets for a total of 250 mcg daily. 07/15/22  Yes Richarda Osmond, MD  metolazone (ZAROXOLYN) 2.5 MG tablet Take 2.5 mg by mouth daily as needed (if weight up > 5 pounds).   Yes [provider]  Metoprolol Tartrate 75 MG TABS Take 75 mg by mouth 2 (two) times daily. 07/26/2022  Yes Richarda Osmond, MD  Mouthwashes (MOUTH RINSE) LIQD solution 15 mLs by Mouth Rinse route as needed (oral care). 03/18/22  Yes Emeterio Reeve, DO  Multiple Vitamin (MULTIVITAMIN) tablet Take 1 tablet by mouth daily.   Yes [provider]  naloxone (NARCAN) nasal spray 4 mg/0.1 mL Place 1 spray into the nose as needed (opioid overdose).   Yes [provider]  pantoprazole (PROTONIX) 40 MG tablet Take 40 mg by mouth daily. 01/11/21  Yes [provider]  polyethylene glycol (MIRALAX / GLYCOLAX) 17 g packet Take 17 g by mouth daily as needed for moderate constipation. 03/18/22  Yes Emeterio Reeve, DO  potassium chloride (KLOR-CON) 20 MEQ packet Take 20 mEq by mouth daily.   Yes [provider]  rOPINIRole (REQUIP) 3 MG tablet Take 1 tablet (3 mg total) by mouth at bedtime. 03/18/22  Yes Emeterio Reeve, DO  Semaglutide, 1 MG/DOSE, 2 MG/1.5ML SOPN Inject 0.75 mLs into the skin once a week. 10/14/20  Yes [provider]  traMADol (ULTRAM) 50 MG tablet Take 50 mg by mouth every 6 (six) hours as needed for moderate pain.   Yes [provider]  zinc gluconate 50 MG tablet Take 50 mg by mouth daily.   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%) BNP 955 on admission Shock: Septic +/- Cardiogenic ~ RESOLVED Mildly elevated Troponin, suspect demand ischemia PMHx: HTN, HLD, Atrial fibrillation Echocardiogram 03/06/22: LVEF 78-67%, normal diastolic parameters, RV systolic function normal, mild to moderate MR, mild to moderate TR -Continuous cardiac monitoring -Maintain MAP >65 -Vasopressors as needed to maintain MAP goal ~ weaned off since evening of 8/25 -Lactic acid has normalized -Trend HS Troponin until peaked (16 ~ 29 ~ ) -Cortisol normal 14.8 -Diuresis as BP and renal function permits ~ Start 20 mg IV Lasix BID now that AKI improving -Consider resuming home carvedilol 6.'25mg'$  BID, torsemine '40mg'$   -Continue Eliquis and Atorvastatin   Severe Sepsis in the setting of suspected Cellulitis of bilateral lower extremities & questionable UTI PMHx: chronic bilateral venous stasis Meets SIRS Criteria  -Monitor fever curve -Trend WBC's & Procalcitonin -Follow cultures as above -Continue empiric Cefepime & Vancomycin pending cultures & sensitivities -Wound care   AKI on CKD Stage III  -Monitor I&O's / urinary output -Follow BMP -Ensure adequate renal perfusion -Avoid nephrotoxic agents as able -Replace electrolytes as indicated    Diabetes Mellitus Type II Hgb A1c is  -CBG's AC & qhs; Target range of 140 to 180 -SSI -Follow ICU Hypo/Hyperglycemia protocol   Hypothyroidism -Continue home Synthroid -TSH and thyroid panel pending  Best practice:  Diet:  {QFDV:44514} Pain/Anxiety/Delirium protocol (if indicated): {Pain/Anxiety/Delirium:26941} VAP protocol (if indicated): {VAP:29640} DVT prophylaxis: {DVT Prophylaxis:26933} GI prophylaxis: {GI:26934} Glucose control:  {Glucose Control:26935} Central venous access:  {Central Venous Access:26936} Arterial line:   {Central Venous Access:26936} Foley:  {Central Venous Access:26936} Mobility:  {Mobility:26937}  PT consulted: {PT Consult:26938} Last date of multidisciplinary goals of care discussion [***] Code Status:  {Code Status:26939} Disposition: ***   = Goals of Care = Code Status Order: '@CODE'$ @   Primary Emergency Contact: Kleinman,Benjamin, Home Phone: [phone] Wishes to pursue full aggressive treatment and intervention options, including CPR and intubation, but goals of care will be addressed on going with family if that should become necessary.   Critical care time: 45 minutes       Rufina Falco  DNP, CCRN, FNP-C, AGACNP-BC Acute Care Nurse Practitioner [facility]  PCCM on call pager [phone] until 7 am

## 2022-07-18 NOTE — Progress Notes (Signed)
NAME:  Madison Mcbride, MRN:  [id number], DOB:  03-03-1956, LOS: 1 ADMISSION DATE:  08/04/2022, CHIEF COMPLAINT:  Respiratory Failure   History of Present Illness:  67 year old F with PMH of A-fib on Eliquis, diastolic CHF, DM-2, HTN, HLD, hypothyroidism, thyroid cancer, anxiety, depression, morbid obesity, chronic back pain, OSA on CPAP and CKD-3A who presented to the ED with c/o SOB and tachycardia.   On review of chart, patient initially presented to ED on 06/29/2022 from a skilled nursing facility with c/o tachycardia with HR in 130s. Labs showed WBC 12.6.  UA concerning for UTI.  COVID-19, influenza and RSV PCR nonreactive. CXR with cardiomegaly without infiltration or edema. Cultures was sent and patient was started on IV meropenem due to hx of ESBL, and IV Cardizem for A fib w/RVR. Urine culture was not collected on presentation, and  given h/o ESBL infection, she completed a full course of empiric IV meropenem on January 2. She was transitioned to PO metoprolol with adequate rate control. Patient was scheduled to be discharged back to SNF on 07/15/22 but unfortunately she went back into Afib with RVR which was thought to be due to missed morning dose of metoprolol. SNF refused to accept her so discharge was cancelled. Cardiology was consulted with recommendation to increase metoprolol from 50 mg to 75 mg BID and Cardizem 60 mg p.o. added q 8hr. Patient was discharged to SNF yesterday but unfortunately approximately two hours after discharge she was sent back to the ED with tachycardia and shortness of breath.  ED Course: Initial vital signs showed HR of 110 beats/minute, BP 114/72 mm Hg, the RR 26 breaths/minute, and the oxygen saturation 95 % on  6 L and a temperature of 97.11F (36.6C).   Pertinent Labs/Diagnostics Findings: Chemistry:Na+/ K+:135/5.2  Glucose:154  BUN/Cr.:63/1.61  CBC: WBC:14.2  Other Lab findings: PCT: 0.54 Lactic acid:3.7  COVID PCR: Negative, Troponin: 24 BNP: 2302   Venous Blood Gas result:  pO2 33; pCO2 94; pH 7.23;  HCO3 39.4, %O2 Sat 39.8.  Imaging:  CXR>small pleural effusions and underlying pulmonary edema or pneumonia, more so on the left side.   CTA Chest 1. No evidence of pulmonary embolism. 2. Moderate to marked severity bilateral lower lobe atelectasis and/or infiltrate. 3. Small to moderate size bilateral pleural effusions.   Patient received IV Cardizem '10mg'$  x 1  for rate control and placed on BiPAP for acute respiratory failure with hypoxia and hypercapnia. .Patient given 30 cc/kg of fluids and started on broad-spectrum antibiotics Vanco cefepime and Flagyl for suspected sepsis. Patient remained hypotensive despite IVF boluses therefore was started on Levophed.  (Sepsis reassessment completed). PCCM consulted for admission to the ICU.  Pertinent  Medical History   Atrial Fibrillation CHF   Diabetes mellitus without complication  Hypothyroidism  Lymphedema  RLS (restless legs syndrome)  Sleep apnea on CPAP CKD Stage III HTN HLD  Thyroid cancer  Depression with Anxiety   Significant Hospital Events: Including procedures, antibiotic start and stop dates in addition to other pertinent events   1/5: Admit to ICU with Acute hypoxic hypercapneic respiratory failure in the setting of pulmonary edema and suspected pneumonia  Interim History / Subjective:  Patient was asleep this morning during my exam. She was on the BIPAP.  Objective   Blood pressure 97/72, pulse (!) 112, temperature 98.4 F (36.9 C), temperature source Axillary, resp. rate 11, height '5\' 5"'$  (1.651 m), weight (!) 136.5 kg, SpO2 94 %.    FiO2 (%):  [35 %-45 %]  35 %   Intake/Output Summary (Last 24 hours) at 07/18/2022 4580 Last data filed at 07/18/2022 0400 Gross per 24 hour  Intake 613.26 ml  Output 300 ml  Net 313.26 ml   Filed Weights   07/25/2022 2242 07/18/22 0500  Weight: (!) 136.5 kg (!) 136.5 kg    Examination: Physical Exam Constitutional:       General: She is not in acute distress.    Appearance: She is obese. She is ill-appearing.  HENT:     Head: Normocephalic.     Mouth/Throat:     Comments: BiPAP mask in place Cardiovascular:     Rate and Rhythm: Tachycardia present. Rhythm irregular.     Heart sounds: Normal heart sounds.  Pulmonary:     Breath sounds: Rales present. No wheezing.  Abdominal:     General: There is distension.     Palpations: Abdomen is soft.  Musculoskeletal:     Cervical back: Normal range of motion.  Skin:    General: Skin is warm.  Neurological:     General: No focal deficit present.     Mental Status: Mental status is at baseline.     Assessment & Plan:   Neurology #Toxic Metabolic Encephalopathy #CO2 Narcosis  Improving on BiPAP. Hold psychotropic medications  Cardiovascular #Afib with RVR #Acute on Chronic HFmrEF #Circulatory Shock  Presents in circulatory shock in the setting of afib with RVR. She was hypotensive limiting choice in rate controlling agents. Given pressor requirements, I've elected to give her Amiodarone for better rate control. Currently she is in sinus rhythm (sinus tachycardia). Her previous TTE from 03/06/2022 had shown an EF of 45-50%, and she is managed with diuresis. Seen by cardiology on 07/16/2021 who'd recommended metoprolol tartrate 75 mg bid and diltiazem 60 mg every 8 hours for rate control in addition to Eliquis. Also recommended lasix 40 mg PO bid for heart failure and addition of ACEi and MRA for GDMT.  -hold GDMT given hypotension -Amiodarone for rate control -Furosemide 40 mg IV bid  Pulmonary #Acute Hypoxic and Hypercapnic Respiratory Failure #Pulmonary Edema #Possible RLL HAP  Respiratory failure with acute hypoxia and hypercapnia with an initial blood gas showing a pH of 7.23 and CO2 of 94. This improved on BiPAP with the most recent VBG showing a pH of 7.33 and CO2 of 70. Will initiate an infectious workup per ID below and continue the patient on  BiPAP with continued diuresis.  Gastrointestinal No active issues. NPO while on BiPAP.  Renal #AKI #Possible Cardiorenal Syndrome  Her creatinine is currently above baseline at 1.67 with an elevated BUN. Concern for cardiorenal syndrome vs. ATN. Will avoid nephrotoxins and closely monitor kidney function and electrolytes with active diuresis. Holding on Metolazone at the moment given kidney function.  Endocrine Continue levothyroxine for hypothyroidism.  Hem/Onc Start heparin gtt for afib in place of Apixaban  ID  Presents with acute hypoxic respiratory failure with concern for pneumonia given atelectasis on chest imaging. Will initiate broad spectrum antibiotics for HAP coverage and check a respiratory viral panel.   Best Practice (right click and "Reselect all SmartList Selections" daily)   Diet/type: NPO DVT prophylaxis: systemic heparin GI prophylaxis: N/A Lines: N/A Foley:  N/A Code Status:  full code Last date of multidisciplinary goals of care discussion [07/18/2022]  Labs   CBC: Recent Labs  Lab 07/13/22 0519 07/14/22 0612 07/16/22 1501 07/14/2022 1523 07/18/22 0652  WBC 13.9* 13.6* 13.1* 14.2* 13.6*  HGB 10.6* 10.7* 10.8* 11.4* 11.1*  HCT 37.3 38.4 38.0 41.1 39.1  MCV 89.2 89.7 89.8 91.7 89.7  PLT 311 337 386 410* 431*    Basic Metabolic Panel: Recent Labs  Lab 07/13/22 0519 07/15/22 2316 07/16/22 1501 08/01/2022 1523 07/18/22 0652  NA 141 141 139 135 139  K 3.6 4.5 4.8 5.2* 4.6  CL 96* 98 93* 94* 96*  CO2 35* 34* 33* 28 31  GLUCOSE 98 120* 110* 154* 96  BUN 31* 41* 48* 63* 66*  CREATININE 0.90 1.11* 1.35* 1.61* 1.67*  CALCIUM 8.5* 8.7* 8.5* 8.4* 8.2*  MG  --  2.3  --   --  2.6*  PHOS  --   --   --   --  7.0*   GFR: Estimated Creatinine Clearance: 45.2 mL/min (A) (by C-G formula based on SCr of 1.67 mg/dL (H)). Recent Labs  Lab 07/14/22 0612 07/16/22 1501 08/03/2022 1523 08/07/2022 2301 07/18/22 0041 07/18/22 0652  PROCALCITON  --   --  0.54   --   --   --   WBC 13.6* 13.1* 14.2*  --   --  13.6*  LATICACIDVEN  --   --   --  3.7* 2.9*  --     Liver Function Tests: Recent Labs  Lab 07/16/22 1501  AST 32  ALT 13  ALKPHOS 108  BILITOT 1.7*  PROT 7.3  ALBUMIN 3.2*   No results for input(s): "LIPASE", "AMYLASE" in the last 168 hours. No results for input(s): "AMMONIA" in the last 168 hours.  ABG    Component Value Date/Time   PHART 7.26 (L) 07/18/2022 0430   PCO2ART 80 (HH) 07/18/2022 0430   PO2ART 124 (H) 07/18/2022 0430   HCO3 35.9 (H) 07/18/2022 0430   O2SAT 99.3 07/18/2022 0430     Coagulation Profile: No results for input(s): "INR", "PROTIME" in the last 168 hours.  Cardiac Enzymes: No results for input(s): "CKTOTAL", "CKMB", "CKMBINDEX", "TROPONINI" in the last 168 hours.  HbA1C: Hgb A1c MFr Bld  Date/Time Value Ref Range Status  07/08/2022 07:24 PM 5.3 4.8 - 5.6 % Final    Comment:    (NOTE)         Prediabetes: 5.7 - 6.4         Diabetes: >6.4         Glycemic control for adults with diabetes: <7.0   03/06/2022 04:04 AM 6.4 (H) 4.8 - 5.6 % Final    Comment:    (NOTE) Pre diabetes:          5.7%-6.4%  Diabetes:              >6.4%  Glycemic control for   <7.0% adults with diabetes     CBG: Recent Labs  Lab 07/15/22 0436 07/15/22 2232 07/16/22 1356 07/16/22 2035 07/28/2022 2238  GLUCAP 118* 110* 102* 136* 96    Past Medical History:  She,  has a past medical history of CHF (congestive heart failure) ([place]), Diabetes mellitus without complication ([place]), Hypothyroidism, Lymphedema, RLS (restless legs syndrome), Sleep apnea, and Thyroid cancer ([place]) (2007).   Surgical History:   Past Surgical History:  Procedure Laterality Date   ABDOMINAL HYSTERECTOMY     COLONOSCOPY WITH PROPOFOL N/A 08/26/2015   Procedure: COLONOSCOPY WITH PROPOFOL;  Surgeon: Manya Silvas, MD;  Location: [facility] ENDOSCOPY;  Service: Endoscopy;  Laterality: N/A;   THYROIDECTOMY       Social History:   reports  that she has never smoked. She has never used smokeless tobacco. She reports that  she does not drink alcohol and does not use drugs.   Family History:  Her family history includes Autoimmune disease in her sister. There is no history of Breast cancer.   Allergies No Known Allergies   Home Medications  Prior to Admission medications   Medication Sig Start Date End Date Taking? Authorizing Provider  acetaminophen (TYLENOL) 650 MG CR tablet Take 1,300 mg by mouth every 8 (eight) hours as needed for pain.   Yes [provider]  ALPRAZolam (XANAX) 0.25 MG tablet Take 1 tablet (0.25 mg total) by mouth 3 (three) times daily as needed for anxiety. 05/29/22  Yes Sreenath, Trula Slade, MD  apixaban (ELIQUIS) 5 MG TABS tablet Take 1 tablet (5 mg total) by mouth 2 (two) times daily. 07/25/21  Yes Dessa Phi, DO  atorvastatin (LIPITOR) 20 MG tablet Take 20 mg by mouth daily.   Yes [provider]  Biotin 5 MG TABS Take 2.5 mg by mouth daily.   Yes [provider]  buPROPion (WELLBUTRIN XL) 150 MG 24 hr tablet Take 150 mg by mouth daily.   Yes [provider]  Catheter Self-Adhesive Urinary MISC Continue PurWIck prn if available 03/18/22  Yes Emeterio Reeve, DO  Cholecalciferol 25 MCG (1000 UT) tablet Take 1 tablet by mouth daily.   Yes [provider]  cyclobenzaprine (FLEXERIL) 5 MG tablet Take 5 mg by mouth 3 (three) times daily as needed for muscle spasms.   Yes [provider]  diltiazem (CARDIZEM) 60 MG tablet Take 1 tablet (60 mg total) by mouth every 8 (eight) hours. 07/22/2022  Yes Richarda Osmond, MD  furosemide (LASIX) 40 MG tablet Take 1 tablet (40 mg total) by mouth 2 (two) times daily. Increase to 1 tablet (40 mg total) by mouth THREE TIMES daily (total daily dose 120 mg) as needed for up to 3 days for increased leg swelling, shortness of breath, weight gain 5+ lbs over 1-2 days. Seek medical care if these symptoms are not improving with  increased dose. Reduce dose to 1 tablet (40 mg total) by mouth ONCE DAILY if dizziness or low blood pressure on bid dose. 03/18/22  Yes Emeterio Reeve, DO  gabapentin (NEURONTIN) 100 MG capsule Take 2 capsules (200 mg total) by mouth 3 (three) times daily. 05/29/22  Yes Sreenath, Sudheer B, MD  Iron, Ferrous Sulfate, 325 (65 Fe) MG TABS Take 325 mg by mouth daily at 12 noon. 05/29/22  Yes Sreenath, Sudheer B, MD  levothyroxine (SYNTHROID) 125 MCG tablet Take 2 tablets (250 mcg total) by mouth daily. Takes two tablets for a total of 250 mcg daily. 07/15/22  Yes Richarda Osmond, MD  metolazone (ZAROXOLYN) 2.5 MG tablet Take 2.5 mg by mouth daily as needed (if weight up > 5 pounds).   Yes [provider]  Metoprolol Tartrate 75 MG TABS Take 75 mg by mouth 2 (two) times daily. 08/01/2022  Yes Richarda Osmond, MD  Mouthwashes (MOUTH RINSE) LIQD solution 15 mLs by Mouth Rinse route as needed (oral care). 03/18/22  Yes Emeterio Reeve, DO  Multiple Vitamin (MULTIVITAMIN) tablet Take 1 tablet by mouth daily.   Yes [provider]  naloxone (NARCAN) nasal spray 4 mg/0.1 mL Place 1 spray into the nose as needed (opioid overdose).   Yes [provider]  pantoprazole (PROTONIX) 40 MG tablet Take 40 mg by mouth daily. 01/11/21  Yes [provider]  polyethylene glycol (MIRALAX / GLYCOLAX) 17 g packet Take 17 g by mouth  daily as needed for moderate constipation. 03/18/22  Yes Emeterio Reeve, DO  potassium chloride (KLOR-CON) 20 MEQ packet Take 20 mEq by mouth daily.   Yes [provider]  rOPINIRole (REQUIP) 3 MG tablet Take 1 tablet (3 mg total) by mouth at bedtime. 03/18/22  Yes Emeterio Reeve, DO  Semaglutide, 1 MG/DOSE, 2 MG/1.5ML SOPN Inject 0.75 mLs into the skin once a week. 10/14/20  Yes [provider]  traMADol (ULTRAM) 50 MG tablet Take 50 mg by mouth every 6 (six) hours as needed for moderate pain.   Yes [provider]  zinc gluconate  50 MG tablet Take 50 mg by mouth daily.   Yes [provider]     Critical care time: [place] Tayshawn Purnell, MD [facility] 07/18/2022 1:59 PM

## 2022-07-18 NOTE — Progress Notes (Signed)
Pharmacy Antibiotic Note  Trameka Dorough Difranco is a 67 y.o. female w/ PMH of PMH of A-fib on Eliquis, diastolic CHF, DM-2, HTN, HLD, hypothyroidism, thyroid cancer, anxiety, depression, morbid obesity, chronic back pain, OSA on CPAP and CKD-3A admitted on 08/10/2022 with pneumonia.  Pharmacy has been consulted for Zosyn  dosing.  Plan: start  Zosyn 3.375g IV q8h (4 hour infusion). ---follow renal function for needed dose adjustments  Height: '5\' 5"'$  (165.1 cm) Weight: (!) 136.5 kg (300 lb 14.9 oz) IBW/kg (Calculated) : 57  Temp (24hrs), Avg:97.9 F (36.6 C), Min:97.6 F (36.4 C), Max:98.4 F (36.9 C)  Recent Labs  Lab 07/13/22 0519 07/14/22 0612 07/15/22 2316 07/16/22 1501 08/08/2022 1523 08/06/2022 2301 07/18/22 0041 07/18/22 0652  WBC 13.9* 13.6*  --  13.1* 14.2*  --   --  13.6*  CREATININE 0.90  --  1.11* 1.35* 1.61*  --   --  1.67*  LATICACIDVEN  --   --   --   --   --  3.7* 2.9*  --     Estimated Creatinine Clearance: 45.2 mL/min (A) (by C-G formula based on SCr of 1.67 mg/dL (H)).    No Known Allergies  Antimicrobials this admission: 01/05 vancomycin, cefepime, azithromycin x 1  01/06 Zosyn >>   Microbiology results: 01/05 BCx: NGTD 01/01 UCx: NG final    Thank you for allowing pharmacy to be a part of this patient's care.  Dallie Piles 07/18/2022 8:37 AM

## 2022-07-18 NOTE — Progress Notes (Signed)
SUBJECTIVE: Patient is a 67 year old caucasian female with PMH of A-fib on Eliquis, diastolic CHF, DM-2, HTN, HLD, hypothyroidism, thyroid cancer, anxiety, depression, morbid obesity, chronic back pain, OSA on CPAP and CKD-3A who presented to the ED on 07/19/2022 with chief complaint of SOB and tachycardia.   Patient discharged from [facility] on 08/07/2022. Returned to [facility]. Patient returned to ED via [facility] after becoming more short of breath, tachycardiac and hypotensive.   Patient found to be in atrial fibrillation with RVR, hypotensive, possible sepsis.    Vitals:   07/18/22 1300 07/18/22 1341 07/18/22 1400 07/18/22 1423  BP: 109/81 96/83 111/73   Pulse: (!) 108 (!) 110 (!) 108   Resp: '17 17 18   '$ Temp:  97.8 F (36.6 C)    TempSrc:  Axillary    SpO2: 94%  95% 97%  Weight:  135 kg    Height:  '5\' 5"'$  (1.651 m)      Intake/Output Summary (Last 24 hours) at 07/18/2022 1552 Last data filed at 07/18/2022 1354 Gross per 24 hour  Intake 1070.24 ml  Output 825 ml  Net 245.24 ml    LABS: Basic Metabolic Panel: Recent Labs    07/15/22 2316 07/16/22 1501 07/18/2022 1523 07/18/22 0652  NA 141   < > 135 139  K 4.5   < > 5.2* 4.6  CL 98   < > 94* 96*  CO2 34*   < > 28 31  GLUCOSE 120*   < > 154* 96  BUN 41*   < > 63* 66*  CREATININE 1.11*   < > 1.61* 1.67*  CALCIUM 8.7*   < > 8.4* 8.2*  MG 2.3  --   --  2.6*  PHOS  --   --   --  7.0*   < > = values in this interval not displayed.   Liver Function Tests: Recent Labs    07/16/22 1501  AST 32  ALT 13  ALKPHOS 108  BILITOT 1.7*  PROT 7.3  ALBUMIN 3.2*   No results for input(s): "LIPASE", "AMYLASE" in the last 72 hours. CBC: Recent Labs    07/23/2022 1523 07/18/22 0652  WBC 14.2* 13.6*  HGB 11.4* 11.1*  HCT 41.1 39.1  MCV 91.7 89.7  PLT 410* 431*   Cardiac Enzymes: No results for input(s): "CKTOTAL", "CKMB", "CKMBINDEX", "TROPONINI" in the last 72 hours. BNP: Invalid input(s): "POCBNP" D-Dimer: No results for input(s):  "DDIMER" in the last 72 hours. Hemoglobin A1C: No results for input(s): "HGBA1C" in the last 72 hours. Fasting Lipid Panel: No results for input(s): "CHOL", "HDL", "LDLCALC", "TRIG", "CHOLHDL", "LDLDIRECT" in the last 72 hours. Thyroid Function Tests: Recent Labs    07/23/2022 1523  TSH 1.771   Anemia Panel: No results for input(s): "VITAMINB12", "FOLATE", "FERRITIN", "TIBC", "IRON", "RETICCTPCT" in the last 72 hours.   PHYSICAL EXAM General: Well developed, well nourished, in no acute distress HEENT:  Normocephalic and atramatic Neck:  No JVD.  Lungs: Clear bilaterally to auscultation and percussion. Heart: HRRR . Normal S1 and S2 without gallops or murmurs.  Abdomen: Bowel sounds are positive, abdomen soft and non-tender  Msk:  Back normal, normal gait. Normal strength and tone for age. Extremities: No clubbing, cyanosis or edema.   Neuro: Alert and oriented X 3. Psych:  Good affect, responds appropriately  TELEMETRY: sinus rhythm, HR 115 bpm  ASSESSMENT AND PLAN: Patient currently laying in bed on BiPAP. Denies chest pain. Reports feeling better overall, shortness of breath improving. Blood  pressure well controlled. Continue IV amiodarone for rate control. Will continue to follow closely.  Principal Problem:   Paroxysmal atrial fibrillation with RVR (HCC) Active Problems:   Type 2 diabetes mellitus with diabetic neuropathy, without long-term current use of insulin (HCC)   Hypothyroidism   HLD (hyperlipidemia)   Elevated troponin   OSA (obstructive sleep apnea)   Acute respiratory failure with hypoxia (HCC)   Acute on chronic respiratory failure with hypoxia and hypercapnia (HCC)   Thoracic aortic aneurysm (HCC)   Hyperkalemia   Hypotension   Leukocytosis   Normocytic anemia    Benicia Bergevin, FNP-C 07/18/2022 3:52 PM

## 2022-07-18 NOTE — Plan of Care (Signed)
Pt is progressing toward goal on her respiratory status. Pt able to tolerate 4L/[place] for couple hours and taking Ice chips and PO Meds well. Levo gtt weaned off. Pt on Heparin gtt now at 1350 units/hr. Pt husband T bedside all day and was updated.

## 2022-07-18 NOTE — Progress Notes (Signed)
PHARMACY CONSULT NOTE  Pharmacy Consult for Electrolyte Monitoring and Replacement   Recent Labs: Potassium (mmol/L)  Date Value  07/18/2022 4.6  01/18/2014 3.7   Magnesium (mg/dL)  Date Value  07/18/2022 2.6 (H)   Calcium (mg/dL)  Date Value  07/18/2022 8.2 (L)   Calcium, Total (mg/dL)  Date Value  01/18/2014 8.2 (L)   Albumin (g/dL)  Date Value  07/16/2022 3.2 (L)  01/17/2014 2.6 (L)   Phosphorus (mg/dL)  Date Value  07/18/2022 7.0 (H)   Sodium (mmol/L)  Date Value  07/18/2022 139  01/18/2014 138   Corrected Ca: 8.8 mg/dL  67 y.o. female w/ PMH of CHF, OSA on CPAP, HLD, T2DM, Hypothyroidism, Afib, RLS, Thyroid cancer and chronic Lymphedema, RLS admitted on 03/05/2022 with PNA. Pharmacy is asked to follow and replace electrolytes while in CCU   Diuretics: furosemide 40 mg IV once daily   Goal of Therapy:  Electrolytes WNL   Plan:  ---no electrolyte replacement warranted for today ---recheck electrolytes in am  Dallie Piles ,PharmD Clinical Pharmacist 07/18/2022 1:14 PM

## 2022-07-18 NOTE — Progress Notes (Signed)
ANTICOAGULATION CONSULT NOTE - Initial Consult  Pharmacy Consult for Heparin Drip Indication: atrial fibrillation  No Known Allergies  Patient Measurements: Height: '5\' 5"'$  (165.1 cm) Weight: 135 kg (297 lb 9.9 oz) IBW/kg (Calculated) : 57 Heparin Dosing Weight: 90.4 kg  Vital Signs: Temp: 97.8 F (36.6 C) (01/06 1341) Temp Source: Axillary (01/06 1341) BP: 96/83 (01/06 1341) Pulse Rate: 110 (01/06 1341)  Labs: Recent Labs    07/16/22 1501 07/13/2022 1523 07/21/2022 1717 07/18/22 0652  HGB 10.8* 11.4*  --  11.1*  HCT 38.0 41.1  --  39.1  PLT 386 410*  --  431*  CREATININE 1.35* 1.61*  --  1.67*  TROPONINIHS 28* 24* 24*  --     Estimated Creatinine Clearance: 44.9 mL/min (A) (by C-G formula based on SCr of 1.67 mg/dL (H)).   Medical History: Past Medical History:  Diagnosis Date   CHF (congestive heart failure) (HCC)    Diabetes mellitus without complication (HCC)    Hypothyroidism    Lymphedema    RLS (restless legs syndrome)    Sleep apnea    Thyroid cancer ([place]) 2007   Assessment: Patient is a 67yo female admitted for pneumonia with a history of afib on Apixaban '5mg'$  twice daily prior to admission. Received last dose of Apixaban on 1/5 ~10:30am.  Baseline Labs: -HL >1.10 (due to Apixaban)  Goal of Therapy:  Heparin level 0.3-0.7 units/ml aPTT 66-102 seconds Monitor platelets by anticoagulation protocol: Yes   Plan:  Give 5000 units bolus x 1 Start heparin infusion at 1350 units/hr Check aPTT in 6 hours, will need to use aPTT to guide dosing until correlates with HL Daily CBC while on Heparin drip  Paulina Fusi, PharmD, BCPS 07/18/2022 3:45 PM

## 2022-07-18 NOTE — Progress Notes (Signed)
ANTICOAGULATION CONSULT NOTE - Initial Consult  Pharmacy Consult for Heparin Drip Indication: atrial fibrillation  No Known Allergies  Patient Measurements: Height: '5\' 5"'$  (165.1 cm) Weight: 135 kg (297 lb 9.9 oz) IBW/kg (Calculated) : 57 Heparin Dosing Weight: 90.4 kg  Vital Signs: Temp: 98.2 F (36.8 C) (01/06 1605) Temp Source: Axillary (01/06 1605) BP: 95/68 (01/06 2000) Pulse Rate: 101 (01/06 2000)  Labs: Recent Labs    07/16/22 1501 07/15/2022 1523 07/23/2022 1717 07/18/22 0652 07/18/22 1456 07/18/22 2044  HGB 10.8* 11.4*  --  11.1*  --   --   HCT 38.0 41.1  --  39.1  --   --   PLT 386 410*  --  431*  --   --   APTT  --   --   --   --  176* 63*  LABPROT  --   --   --   --  35.4*  --   INR  --   --   --   --  3.6*  --   HEPARINUNFRC  --   --   --   --  >1.10*  --   CREATININE 1.35* 1.61*  --  1.67*  --  1.69*  TROPONINIHS 28* 24* 24*  --   --   --      Estimated Creatinine Clearance: 44.4 mL/min (A) (by C-G formula based on SCr of 1.69 mg/dL (H)).   Medical History: Past Medical History:  Diagnosis Date   CHF (congestive heart failure) (HCC)    Diabetes mellitus without complication (HCC)    Hypothyroidism    Lymphedema    RLS (restless legs syndrome)    Sleep apnea    Thyroid cancer ([place]) 2007   Assessment: Patient is a 67yo female admitted for pneumonia with a history of afib on Apixaban '5mg'$  twice daily prior to admission. Received last dose of Apixaban on 1/5 ~10:30am.  Baseline Labs: -HL >1.10 (due to Apixaban)  Goal of Therapy:  Heparin level 0.3-0.7 units/ml aPTT 66-102 seconds Monitor platelets by anticoagulation protocol: Yes  1/6 @ 2044  aPTT = 63 sec, subtherapeutic @ 1350 units/hr   Plan:  Give heparin bolus of 1300 units IV x 1 Increase heparin drip to 1500 units/hr Check aPTT in 6 hours, will need to use aPTT to guide dosing until correlates with HL Daily CBC while on Heparin drip  Beatris Si, PharmD 07/18/2022 9:19 PM

## 2022-07-18 NOTE — Progress Notes (Signed)
Madison Mcbride    SUBJECTIVE: Madison Mcbride is doing reasonably well no worsening symptoms no chest pain mild shortness of breath mild palpitations tachycardia    Vitals:   07/18/22 1100 07/18/22 1200 07/18/22 1228 07/18/22 1341  BP: (!) 128/111 112/75 100/78 96/83  Pulse: (!) 107 (!) 111 (!) 111 (!) 110  Resp: (!) '21 20 17 17  '$ Temp:    97.8 F (36.6 C)  TempSrc:    Axillary  SpO2: (!) 80% 94% 98%   Weight:    135 kg  Height:    '5\' 5"'$  (1.651 m)     Intake/Output Summary (Last 24 hours) at 07/18/2022 1348 Last data filed at 07/18/2022 1255 Gross per 24 hour  Intake 1035.84 ml  Output 825 ml  Net 210.84 ml      PHYSICAL EXAM  General: Well developed, well nourished, in no acute distress HEENT:  Normocephalic and atramatic Neck:  No JVD.  Lungs: Clear bilaterally to auscultation and percussion. Heart: Irregular irregular. Normal S1 and S2 without gallops or murmurs.  Abdomen: Bowel sounds are positive, abdomen soft and non-tender  Msk:  Back normal, normal gait. Normal strength and tone for age. Extremities: No clubbing, cyanosis or edema.   Neuro: Alert and oriented X 3. Psych:  Good affect, responds appropriately   LABS: Basic Metabolic Panel: Recent Labs    07/15/22 2316 07/16/22 1501 08/02/2022 1523 07/18/22 0652  NA 141   < > 135 139  K 4.5   < > 5.2* 4.6  CL 98   < > 94* 96*  CO2 34*   < > 28 31  GLUCOSE 120*   < > 154* 96  BUN 41*   < > 63* 66*  CREATININE 1.11*   < > 1.61* 1.67*  CALCIUM 8.7*   < > 8.4* 8.2*  MG 2.3  --   --  2.6*  PHOS  --   --   --  7.0*   < > = values in this interval not displayed.   Liver Function Tests: Recent Labs    07/16/22 1501  AST 32  ALT 13  ALKPHOS 108  BILITOT 1.7*  PROT 7.3  ALBUMIN 3.2*   No results for input(s): "LIPASE", "AMYLASE" in the last 72 hours. CBC: Recent Labs    07/19/2022 1523 07/18/22 0652  WBC 14.2* 13.6*  HGB 11.4* 11.1*  HCT 41.1 39.1  MCV 91.7 89.7  PLT 410* 431*   Cardiac  Enzymes: No results for input(s): "CKTOTAL", "CKMB", "CKMBINDEX", "TROPONINI" in the last 72 hours. BNP: Invalid input(s): "POCBNP" D-Dimer: No results for input(s): "DDIMER" in the last 72 hours. Hemoglobin A1C: No results for input(s): "HGBA1C" in the last 72 hours. Fasting Lipid Panel: No results for input(s): "CHOL", "HDL", "LDLCALC", "TRIG", "CHOLHDL", "LDLDIRECT" in the last 72 hours. Thyroid Function Tests: Recent Labs    08/04/2022 1523  TSH 1.771   Anemia Panel: No results for input(s): "VITAMINB12", "FOLATE", "FERRITIN", "TIBC", "IRON", "RETICCTPCT" in the last 72 hours.  CT Angio Chest PE W and/or Wo Contrast  Result Date: 07/27/2022 CLINICAL DATA:  Pain and shortness of breath. EXAM: CT ANGIOGRAPHY CHEST WITH CONTRAST TECHNIQUE: Multidetector CT imaging of the chest was performed using the standard protocol during bolus administration of intravenous contrast. Multiplanar CT image reconstructions and MIPs were obtained to evaluate the vascular anatomy. RADIATION DOSE REDUCTION: This exam was performed according to the departmental dose-optimization program which includes automated exposure control, adjustment of the mA and/or kV according  to Madison size and/or use of iterative reconstruction technique. CONTRAST:  33m OMNIPAQUE IOHEXOL 350 MG/ML SOLN COMPARISON:  None Available. FINDINGS: Cardiovascular: There is mild to moderate severity calcification of the aortic arch and descending thoracic aorta. The ascending thoracic aorta is dilated and measures 5.8 cm x 5.4 cm, proximally. Satisfactory opacification of the pulmonary arteries to the segmental level. No evidence of pulmonary embolism. The main pulmonary artery measures 3.9 cm. There is moderate severity cardiomegaly with moderate severity coronary artery calcification. No pericardial effusion. Mediastinum/Nodes: No enlarged mediastinal, hilar, or axillary lymph nodes. Thyroid gland, trachea, and esophagus demonstrate no  significant findings. Lungs/Pleura: Mild to moderate severity patchy ground-glass appearance of the lung parenchyma is noted. Moderate to marked severity atelectasis and/or infiltrate is seen within the bilateral lower lobes. There are small to moderate size bilateral pleural effusions. No pneumothorax is identified. Upper Abdomen: A 14 mm x 12 mm mildly hyperdense, exophytic lesion is seen along the lateral aspect of the upper pole of the right kidney (axial CT image 182, CT series 5). Musculoskeletal: Multilevel degenerative changes seen throughout the thoracic spine. Review of the MIP images confirms the above findings. IMPRESSION: 1. No evidence of pulmonary embolism. 2. Moderate to marked severity bilateral lower lobe atelectasis and/or infiltrate. 3. Small to moderate size bilateral pleural effusions. 4. Moderate severity cardiomegaly with moderate severity coronary artery calcification. 5. Dilated ascending thoracic aorta. 6. Dilated main pulmonary artery which may represent pulmonary arterial hypertension. 7. 14 mm x 12 mm mildly hyperdense, exophytic lesion along the lateral aspect of the upper pole of the right kidney. While this may represent a hemorrhagic cyst, correlation with nonemergent renal ultrasound is recommended. 8. Aortic atherosclerosis. Aortic Atherosclerosis (ICD10-I70.0). Electronically Signed   By: TVirgina NorfolkM.D.   On: 07/21/2022 18:40   DG Chest 2 View  Result Date: 08/03/2022 CLINICAL DATA:  Shortness of breath EXAM: CHEST - 2 VIEW COMPARISON:  Previous studies including the examination of 07/16/2022 FINDINGS: Transverse diameter of heart is increased. Central pulmonary vessels are less prominent. There is interval improvement in aeration in both lungs. There is residual increase in interstitial markings in parahilar regions and lower lung fields. There is increased density in left lower lung field partly obscuring the left hemidiaphragm. There is blunting of left lateral CP  angle. There is no pneumothorax. IMPRESSION: There is partial clearing of pulmonary edema. Residual increased density is seen in both lower lung fields, more so on the left side suggesting a small pleural effusions and underlying pulmonary edema or pneumonia, more so on the left side. Electronically Signed   By: PElmer PickerM.D.   On: 08/01/2022 17:09   DG Chest Port 1 View  Result Date: 07/16/2022 CLINICAL DATA:  10027 Tachypnea 10027 EXAM: PORTABLE CHEST 1 VIEW COMPARISON:  Chest x-ray 07/08/2022 FINDINGS: Persistently enlarged cardiac silhouette. The heart and mediastinal contours are within normal limits. Prominent hilar vasculature. No focal consolidation. Increased marking. No pleural effusion. No pneumothorax. No acute osseous abnormality. IMPRESSION: Cardiomegaly with pulmonary edema. Electronically Signed   By: MIven FinnM.D.   On: 07/16/2022 20:54     Echo mild reduced left ventricular function around 45%  TELEMETRY: Atrial fibrillation rate around 100:  ASSESSMENT AND PLAN:  Principal Problem:   Paroxysmal atrial fibrillation with RVR (HCC) Active Problems:   Type 2 diabetes mellitus with diabetic neuropathy, without long-term current use of insulin (HCC)   Hypothyroidism   HLD (hyperlipidemia)   Elevated troponin   OSA (obstructive sleep  apnea)   Acute respiratory failure with hypoxia (HCC)   Acute on chronic respiratory failure with hypoxia and hypercapnia (HCC)   Thoracic aortic aneurysm (HCC)   Hyperkalemia   Hypotension   Leukocytosis   Normocytic anemia    Plan Madison states he feels reasonably well reduced shortness of breath no chest pain Madison still has palpitations but heart rate reasonably controlled at around 90-100 Denies any significant leg swelling continue current medical therapy And scheduled for discharge later today appears to be somewhat improved with missed doses of metoprolol yesterday which caused some tachycardia rates better  controlled today Obstructive sleep apnea recommend to continue sleep CPAP as needed Diabetes type 2 continue current medical therapy Supplemental oxygen as necessary for dyspnea shortness of breath Continue heart failure cardiomyopathy therapy with diuretics as needed Have the Madison follow-up with Mcbride 1 to 2 weeks upon discharge   Yolonda Kida, MD 07/18/2022 1:48 PM

## 2022-07-19 LAB — GLUCOSE, CAPILLARY
Glucose-Capillary: 108 mg/dL — ABNORMAL HIGH (ref 70–99)
Glucose-Capillary: 65 mg/dL — ABNORMAL LOW (ref 70–99)
Glucose-Capillary: 78 mg/dL (ref 70–99)
Glucose-Capillary: 94 mg/dL (ref 70–99)

## 2022-07-19 LAB — APTT
aPTT: 72 seconds — ABNORMAL HIGH (ref 24–36)
aPTT: 58 seconds — ABNORMAL HIGH (ref 24–36)
aPTT: 55 seconds — ABNORMAL HIGH (ref 24–36)

## 2022-07-19 LAB — CBC
HCT: 36.1 % (ref 36.0–46.0)
Hemoglobin: 10.2 g/dL — ABNORMAL LOW (ref 12.0–15.0)
MCH: 25.5 pg — ABNORMAL LOW (ref 26.0–34.0)
MCHC: 28.3 g/dL — ABNORMAL LOW (ref 30.0–36.0)
MCV: 90.3 fL (ref 80.0–100.0)
Platelets: 317 10*3/uL (ref 150–400)
RBC: 4 MIL/uL (ref 3.87–5.11)
RDW: 24.1 % — ABNORMAL HIGH (ref 11.5–15.5)
WBC: 9.5 10*3/uL (ref 4.0–10.5)
nRBC: 0.4 % — ABNORMAL HIGH (ref 0.0–0.2)

## 2022-07-19 LAB — BASIC METABOLIC PANEL
Anion gap: 14 (ref 5–15)
BUN: 62 mg/dL — ABNORMAL HIGH (ref 8–23)
CO2: 28 mmol/L (ref 22–32)
Calcium: 8 mg/dL — ABNORMAL LOW (ref 8.9–10.3)
Chloride: 98 mmol/L (ref 98–111)
Creatinine, Ser: 1.59 mg/dL — ABNORMAL HIGH (ref 0.44–1.00)
GFR, Estimated: 35 mL/min — ABNORMAL LOW (ref >60)
Glucose, Bld: 67 mg/dL — ABNORMAL LOW (ref 70–99)
Potassium: 4.2 mmol/L (ref 3.5–5.1)
Sodium: 140 mmol/L (ref 135–145)

## 2022-07-19 LAB — HEPARIN LEVEL (UNFRACTIONATED): Heparin Unfractionated: >1.1 IU/mL — ABNORMAL HIGH (ref 0.30–0.70)

## 2022-07-19 LAB — MAGNESIUM: Magnesium: 2.4 mg/dL (ref 1.7–2.4)

## 2022-07-19 MED ORDER — AMIODARONE HCL IN DEXTROSE 360-4.14 MG/200ML-% IV SOLN: 30.0000 mg/h | INTRAVENOUS | Status: DC

## 2022-07-19 MED ORDER — HEPARIN BOLUS VIA INFUSION
1350.0000 [IU] | Freq: Once | INTRAVENOUS | Status: AC
  Administered 2022-07-19: 1350 [IU] via INTRAVENOUS
  Filled 2022-07-19: qty 1350

## 2022-07-19 MED ORDER — DEXTROSE 50 % IV SOLN
12.5000 g | INTRAVENOUS | Status: AC
  Administered 2022-07-19: 12.5 g via INTRAVENOUS
  Filled 2022-07-19: qty 50

## 2022-07-19 MED ORDER — GABAPENTIN 100 MG PO CAPS
200.0000 mg | ORAL_CAPSULE | Freq: Three times a day (TID) | ORAL | Status: DC
  Administered 2022-07-19 – 2022-07-23 (×12): 200 mg via ORAL
  Filled 2022-07-19 (×12): qty 2

## 2022-07-19 MED ORDER — CHLORHEXIDINE GLUCONATE CLOTH 2 % EX PADS
6.0000 | MEDICATED_PAD | Freq: Every day | CUTANEOUS | Status: DC
  Administered 2022-07-19 – 2022-07-21 (×3): 6 via TOPICAL

## 2022-07-19 MED ORDER — METOPROLOL TARTRATE 5 MG/5ML IV SOLN
5.0000 mg | Freq: Once | INTRAVENOUS | Status: AC
  Administered 2022-07-19: 5 mg via INTRAVENOUS
  Filled 2022-07-19: qty 5

## 2022-07-19 MED ORDER — ROPINIROLE HCL 1 MG PO TABS
3.0000 mg | ORAL_TABLET | Freq: Every day | ORAL | Status: DC
  Administered 2022-07-19 – 2022-07-26 (×8): 3 mg via ORAL
  Filled 2022-07-19 (×8): qty 3

## 2022-07-19 MED ORDER — AMIODARONE HCL IN DEXTROSE 360-4.14 MG/200ML-% IV SOLN: 60.0000 mg/h | INTRAVENOUS | Status: DC

## 2022-07-19 MED ORDER — AMIODARONE HCL 200 MG PO TABS
400.0000 mg | ORAL_TABLET | Freq: Every day | ORAL | Status: DC
  Administered 2022-07-19 – 2022-07-22 (×4): 400 mg via ORAL
  Filled 2022-07-19 (×6): qty 2

## 2022-07-19 MED ORDER — HEPARIN BOLUS VIA INFUSION
2000.0000 [IU] | Freq: Once | INTRAVENOUS | Status: AC
  Administered 2022-07-19: 2000 [IU] via INTRAVENOUS
  Filled 2022-07-19: qty 2000

## 2022-07-19 NOTE — Progress Notes (Signed)
ANTICOAGULATION CONSULT NOTE - Initial Consult  Pharmacy Consult for Heparin Drip Indication: atrial fibrillation  No Known Allergies  Patient Measurements: Height: '5\' 5"'$  (165.1 cm) Weight: 135 kg (297 lb 9.9 oz) IBW/kg (Calculated) : 57 Heparin Dosing Weight: 90.4 kg  Vital Signs: Temp: 97.9 F (36.6 C) (01/07 2000) Temp Source: Oral (01/07 2000) BP: 121/79 (01/07 2000) Pulse Rate: 121 (01/07 2000)  Labs: Recent Labs    08/05/2022 1523 07/15/2022 1717 07/18/22 0652 07/18/22 1456 07/18/22 1456 07/18/22 2044 07/19/22 0541 07/19/22 1146 07/19/22 1959  HGB 11.4*  --  11.1*  --   --   --  10.2*  --   --   HCT 41.1  --  39.1  --   --   --  36.1  --   --   PLT 410*  --  431*  --   --   --  317  --   --   APTT  --   --   --  176*   < > 63* 72* 58* 55*  LABPROT  --   --   --  35.4*  --   --   --   --   --   INR  --   --   --  3.6*  --   --   --   --   --   HEPARINUNFRC  --   --   --  >1.10*  --   --  >1.10*  --   --   CREATININE 1.61*  --  1.67*  --   --  1.69* 1.59*  --   --   TROPONINIHS 24* 24*  --   --   --   --   --   --   --    < > = values in this interval not displayed.     Estimated Creatinine Clearance: 47.2 mL/min (A) (by C-G formula based on SCr of 1.59 mg/dL (H)).   Medical History: Past Medical History:  Diagnosis Date   CHF (congestive heart failure) (HCC)    Diabetes mellitus without complication (HCC)    Hypothyroidism    Lymphedema    RLS (restless legs syndrome)    Sleep apnea    Thyroid cancer ([place]) 2007   Assessment: Patient is a 67yo female admitted for pneumonia with a history of afib on Apixaban '5mg'$  twice daily prior to admission. Received last dose of Apixaban on 1/5 ~10:30am.  Baseline Labs: -HL >1.10 (due to Apixaban)  Goal of Therapy:  Heparin level 0.3-0.7 units/ml aPTT 66-102 seconds Monitor platelets by anticoagulation protocol: Yes    Plan: aPTT subtherapeutic --Bolus heparin 2000 units x 1 --Increase heparin rate to 1900  units/hr - Will continue to use aPTT to guide dosing --recheck aPTT 6 hours after rate change --Daily CBC while on Heparin drip  Lorin Picket, PharmD 07/19/2022 8:37 PM

## 2022-07-19 NOTE — Progress Notes (Signed)
PHARMACY CONSULT NOTE  Pharmacy Consult for Electrolyte Monitoring and Replacement   Recent Labs: Potassium (mmol/L)  Date Value  07/19/2022 4.2  01/18/2014 3.7   Magnesium (mg/dL)  Date Value  07/19/2022 2.4   Calcium (mg/dL)  Date Value  07/19/2022 8.0 (L)   Calcium, Total (mg/dL)  Date Value  01/18/2014 8.2 (L)   Albumin (g/dL)  Date Value  07/16/2022 3.2 (L)  01/17/2014 2.6 (L)   Phosphorus (mg/dL)  Date Value  07/18/2022 7.0 (H)   Sodium (mmol/L)  Date Value  07/19/2022 140  01/18/2014 138   Corrected Ca: 8.8 mg/dL  66 y.o. female w/ PMH of CHF, OSA on CPAP, HLD, T2DM, Hypothyroidism, Afib, RLS, Thyroid cancer and chronic Lymphedema, RLS admitted on 03/05/2022 with PNA. Pharmacy is asked to follow and replace electrolytes while in CCU   Diuretics: furosemide 40 mg IV BID   Goal of Therapy:  Electrolytes WNL   Plan:  ---no electrolyte replacement warranted for today ---recheck electrolytes in am  Dallie Piles ,PharmD Clinical Pharmacist 07/19/2022 7:12 AM

## 2022-07-19 NOTE — Progress Notes (Signed)
ANTICOAGULATION CONSULT NOTE - Initial Consult  Pharmacy Consult for Heparin Drip Indication: atrial fibrillation  No Known Allergies  Patient Measurements: Height: '5\' 5"'$  (165.1 cm) Weight: 135 kg (297 lb 9.9 oz) IBW/kg (Calculated) : 57 Heparin Dosing Weight: 90.4 kg  Vital Signs: Temp: 98.3 F (36.8 C) (01/06 2000) Temp Source: Axillary (01/06 2000) BP: 100/67 (01/07 0300) Pulse Rate: 110 (01/07 0309)  Labs: Recent Labs    07/16/22 1501 07/25/2022 1523 08/10/2022 1717 07/18/22 0652 07/18/22 1456 07/18/22 2044 07/19/22 0541  HGB 10.8* 11.4*  --  11.1*  --   --  10.2*  HCT 38.0 41.1  --  39.1  --   --  36.1  PLT 386 410*  --  431*  --   --  317  APTT  --   --   --   --  176* 63* 72*  LABPROT  --   --   --   --  35.4*  --   --   INR  --   --   --   --  3.6*  --   --   HEPARINUNFRC  --   --   --   --  >1.10*  --  >1.10*  CREATININE 1.35* 1.61*  --  1.67*  --  1.69* 1.59*  TROPONINIHS 28* 24* 24*  --   --   --   --      Estimated Creatinine Clearance: 47.2 mL/min (A) (by C-G formula based on SCr of 1.59 mg/dL (H)).   Medical History: Past Medical History:  Diagnosis Date   CHF (congestive heart failure) (HCC)    Diabetes mellitus without complication (HCC)    Hypothyroidism    Lymphedema    RLS (restless legs syndrome)    Sleep apnea    Thyroid cancer ([place]) 2007   Assessment: Patient is a 67yo female admitted for pneumonia with a history of afib on Apixaban '5mg'$  twice daily prior to admission. Received last dose of Apixaban on 1/5 ~10:30am.  Baseline Labs: -HL >1.10 (due to Apixaban)  Goal of Therapy:  Heparin level 0.3-0.7 units/ml aPTT 66-102 seconds Monitor platelets by anticoagulation protocol: Yes  1/6 @ 2044  aPTT = 63 sec, subtherapeutic @ 1350 units/hr 1/7 @ 0541  aPTT = 72 sec, HL = > 1.10   Plan:  1/7 @ 0541: aPTT = 72,  HL = > 1.10 - aPTT therapeutic X 1, HL still elevated from Eliquis - Will continue to use aPTT to guide dosing - Will draw  confirmation aPTT on 1/7 @ 1200 and recheck HL on 1/8 with AM labs.  Daily CBC while on Heparin drip  Aleksandar Duve D 07/19/2022 6:10 AM

## 2022-07-19 NOTE — Progress Notes (Signed)
ANTICOAGULATION CONSULT NOTE - Initial Consult  Pharmacy Consult for Heparin Drip Indication: atrial fibrillation  No Known Allergies  Patient Measurements: Height: '5\' 5"'$  (165.1 cm) Weight: 135 kg (297 lb 9.9 oz) IBW/kg (Calculated) : 57 Heparin Dosing Weight: 90.4 kg  Vital Signs: Temp: 98.4 F (36.9 C) (01/07 0000) Temp Source: Axillary (01/07 0400) BP: 115/72 (01/07 0900) Pulse Rate: 116 (01/07 0900)  Labs: Recent Labs    07/16/22 1501 07/25/2022 1523 07/28/2022 1717 07/18/22 0652 07/18/22 1456 07/18/22 2044 07/19/22 0541  HGB 10.8* 11.4*  --  11.1*  --   --  10.2*  HCT 38.0 41.1  --  39.1  --   --  36.1  PLT 386 410*  --  431*  --   --  317  APTT  --   --   --   --  176* 63* 72*  LABPROT  --   --   --   --  35.4*  --   --   INR  --   --   --   --  3.6*  --   --   HEPARINUNFRC  --   --   --   --  >1.10*  --  >1.10*  CREATININE 1.35* 1.61*  --  1.67*  --  1.69* 1.59*  TROPONINIHS 28* 24* 24*  --   --   --   --      Estimated Creatinine Clearance: 47.2 mL/min (A) (by C-G formula based on SCr of 1.59 mg/dL (H)).   Medical History: Past Medical History:  Diagnosis Date   CHF (congestive heart failure) (HCC)    Diabetes mellitus without complication (HCC)    Hypothyroidism    Lymphedema    RLS (restless legs syndrome)    Sleep apnea    Thyroid cancer ([place]) 2007   Assessment: Patient is a 67yo female admitted for pneumonia with a history of afib on Apixaban '5mg'$  twice daily prior to admission. Received last dose of Apixaban on 1/5 ~10:30am.  Baseline Labs: -HL >1.10 (due to Apixaban)  Goal of Therapy:  Heparin level 0.3-0.7 units/ml aPTT 66-102 seconds Monitor platelets by anticoagulation protocol: Yes    Plan: aPTT subtherapeutic --bolus 1350 units IV heparin then increase heparin infusion rate to 1700 units/hr - Will continue to use aPTT to guide dosing --recheck aPTT 6 hours after rate change --Daily CBC while on Heparin drip  Dallie Piles 07/19/2022 9:39 AM

## 2022-07-19 NOTE — Progress Notes (Signed)
SUBJECTIVE: Patient is a 67 year old caucasian female with PMH of A-fib on Eliquis, diastolic CHF, DM-2, HTN, HLD, hypothyroidism, thyroid cancer, anxiety, depression, morbid obesity, chronic back pain, OSA on CPAP and CKD-3A who presented to the ED on 08/04/2022 with chief complaint of SOB and tachycardia.    Patient discharged from [facility] on 07/31/2022. Returned to [facility]. Patient returned to ED via [facility] after becoming more short of breath, tachycardiac and hypotensive.    Patient found to be in atrial fibrillation with RVR, hypotensive, possible sepsis.   Patient on O2 via [place], tolerating well.    Vitals:   07/19/22 1215 07/19/22 1230 07/19/22 1242 07/19/22 1246  BP: 111/82 (!) 129/106  110/69  Pulse: (!) 119 (!) 117 (!) 118 (!) 119  Resp: (!) 23 19 (!) 30 18  Temp:      TempSrc:      SpO2: 97% 100% 100% 95%  Weight:      Height:        Intake/Output Summary (Last 24 hours) at 07/19/2022 1307 Last data filed at 07/19/2022 1300 Gross per 24 hour  Intake 889.88 ml  Output 800 ml  Net 89.88 ml    LABS: Basic Metabolic Panel: Recent Labs    07/18/22 0652 07/18/22 2044 07/19/22 0541  NA 139 140 140  K 4.6 4.6 4.2  CL 96* 96* 98  CO2 '31 28 28  '$ GLUCOSE 96 78 67*  BUN 66* 67* 62*  CREATININE 1.67* 1.69* 1.59*  CALCIUM 8.2* 8.2* 8.0*  MG 2.6*  --  2.4  PHOS 7.0*  --   --    Liver Function Tests: Recent Labs    07/16/22 1501  AST 32  ALT 13  ALKPHOS 108  BILITOT 1.7*  PROT 7.3  ALBUMIN 3.2*   No results for input(s): "LIPASE", "AMYLASE" in the last 72 hours. CBC: Recent Labs    07/18/22 0652 07/19/22 0541  WBC 13.6* 9.5  HGB 11.1* 10.2*  HCT 39.1 36.1  MCV 89.7 90.3  PLT 431* 317   Cardiac Enzymes: No results for input(s): "CKTOTAL", "CKMB", "CKMBINDEX", "TROPONINI" in the last 72 hours. BNP: Invalid input(s): "POCBNP" D-Dimer: No results for input(s): "DDIMER" in the last 72 hours. Hemoglobin A1C: No results for input(s): "HGBA1C" in the last 72  hours. Fasting Lipid Panel: No results for input(s): "CHOL", "HDL", "LDLCALC", "TRIG", "CHOLHDL", "LDLDIRECT" in the last 72 hours. Thyroid Function Tests: Recent Labs    07/27/2022 1523  TSH 1.771   Anemia Panel: No results for input(s): "VITAMINB12", "FOLATE", "FERRITIN", "TIBC", "IRON", "RETICCTPCT" in the last 72 hours.   PHYSICAL EXAM General: Well developed, well nourished, in no acute distress HEENT:  Normocephalic and atramatic Neck:  No JVD.  Lungs: Clear bilaterally to auscultation and percussion. Heart: HRRR . Normal S1 and S2 without gallops or murmurs.  Abdomen: Bowel sounds are positive, abdomen soft and non-tender  Msk:  Back normal, normal gait. Normal strength and tone for age. Extremities: No clubbing, cyanosis or edema.   Neuro: Alert and oriented X 3. Psych:  Good affect, responds appropriately  TELEMETRY: sinus rhythm, HR 119  ASSESSMENT AND PLAN: Patient doing well. Denies chest pain. Shortness of breath improving, now on [place]. HR elevated. Not on amiodarone, will start now. Will continue to follow.   Principal Problem:   Paroxysmal atrial fibrillation with RVR (HCC) Active Problems:   Type 2 diabetes mellitus with diabetic neuropathy, without long-term current use of insulin (HCC)   Hypothyroidism   HLD (hyperlipidemia)  Elevated troponin   OSA (obstructive sleep apnea)   Acute respiratory failure with hypoxia (HCC)   Acute on chronic respiratory failure with hypoxia and hypercapnia (HCC)   Thoracic aortic aneurysm (HCC)   Hyperkalemia   Hypotension   Leukocytosis   Normocytic anemia    Helena Sardo, FNP-C 07/19/2022 1:07 PM

## 2022-07-19 NOTE — Progress Notes (Signed)
Progress Note   Patient: Madison Mcbride IPJ:[id number] DOB: 1956-05-19 DOA: 07/20/2022     2 DOS: the patient was seen and examined on 07/19/2022   Brief hospital course: Madison Mcbride is a 67 y.o. female with medical history significant of thyroid cancer, hypothyroidism hyperlipidemia, Dm2, CKD stage 3, Pafib , CHFpEF, OSA on CPAP, was discharged earlier today to SNF and found to be tachycardic on intake and sent back to ED.  Pt husband states that she appeared diaphoretic and slightly dyspneic, and somnolent.   pt denies fever, chills, cough, cp, palp, n/v, abd pain, diarrhea, brbpr, black stool, dysuria, hematuria, flank pain .    In ED, T 97.9  P 110 R 26 Bp 114/72  pox 95% on 6L [place] TSH 1.771 Na 135, K 5.2, Bun 63, Creat 1.61 Wbc 14.2, Hgb 11.4, Plt 410 Ph 7.23, pCo2 94  p O2 38 Procalcitonin 0.54 Trop 24-> 24 BNP 2,302.4 CT showed no PE  Pt was given cardizem '10mg'$  iv x1 with slight benefit in ED, pt started on BIPAP for acute respiratory failure with hypoxia and hypercapnea.  Pt became hypotensive w sbp 70's and admitted to ICU. Patient given 30 cc/kg of fluids and started on broad-spectrum antibiotics Vanco cefepime and Flagyl for suspected sepsis. Patient remained hypotensive despite IVF boluses therefore was started on Levophed.    1/7 : Patient was weaned off Levophed. Her home medication of Lasix Cardizem and metoprolol was held as she was hypotensive.  Patient was weaned off BiPAP maintaining saturations 99 to 100% on 6 L.  Assessment and Plan:  Acute Hypoxic and Hypercapnic Respiratory Failure Pulmonary Edema, Possible RLL HAP  -Patient was on the BiPAP which was transitioned to nasal cannula at 4 to 5 L.  Tolerating it well.  Wean as tolerated -Continue with pneumonia management with broad-spectrum antibiotics Zosyn and vancomycin d/c as MRSA PCR negative.  Afib with RVR :  Monitor the patient on telemetry.  Tele Cycle trop Amiodarone po  Hold  metoprolol,Cont  Eliquis '5mg'$  po bid Follow cardiac echo  (last echo 03/06/22) -Cardiology following.   AKI : Possibly cardiorenal.  Baseline around 1-1.2 now presenting with 1.6.  Continue to monitor input output, daily weights.  Avoid nephrotoxic medication.  Avoid NSAIDs.  Hypothyroidism : Continue levothyroxine      Subjective: Patient seen and examined this morning.  Off BiPAP.  On nasal cannula at 5 to 6 L.  Uses intermittent oxygen at home.  No complaint of chest pain chest tightness.  Vital labs and imaging reviewed.  Physical Exam: Vitals:   07/19/22 1515 07/19/22 1526 07/19/22 1530 07/19/22 1545  BP: 119/76  129/80 127/79  Pulse: (!) 125 (!) 122 (!) 119 (!) 122  Resp: 16 (!) 25 (!) 21 (!) 21  Temp:      TempSrc:      SpO2: 90% 98% 99% 96%  Weight:      Height:       Physical Exam Constitutional:      Appearance: She is well-developed. She is obese.  HENT:     Head: Normocephalic and atraumatic.  Eyes:     Pupils: Pupils are equal, round, and reactive to light.  Cardiovascular:     Rate and Rhythm: Tachycardia present. Rhythm irregular.     Heart sounds: No murmur heard.    No friction rub.  Pulmonary:     Effort: Tachypnea present.     Breath sounds: No rales.  Chest:  Chest wall: No tenderness.  Abdominal:     Palpations: Abdomen is soft.  Musculoskeletal:        General: Normal range of motion.     Cervical back: Normal range of motion.  Skin:    General: Skin is warm.  Neurological:     General: No focal deficit present.     Mental Status: She is alert.  Psychiatric:        Mood and Affect: Mood normal.     Data Reviewed:  There are no new results to review at this time.  Family Communication: None   Disposition: Status is: Inpatient Remains inpatient appropriate because: Hypoxic respiratory failure and  Afib with rvr   Planned Discharge Destination: Home    Time spent: 35 minutes  Author: Oran Rein, MD 07/19/2022 4:39 PM  For on call review  [email or web address].

## 2022-07-20 DIAGNOSIS — Y95 Nosocomial condition: Secondary | ICD-10-CM

## 2022-07-20 DIAGNOSIS — I480 Paroxysmal atrial fibrillation: Secondary | ICD-10-CM | POA: Diagnosis not present

## 2022-07-20 DIAGNOSIS — J189 Pneumonia, unspecified organism: Secondary | ICD-10-CM | POA: Diagnosis not present

## 2022-07-20 DIAGNOSIS — J810 Acute pulmonary edema: Secondary | ICD-10-CM

## 2022-07-20 DIAGNOSIS — R0602 Shortness of breath: Secondary | ICD-10-CM

## 2022-07-20 LAB — CBC
HCT: 32.7 % — ABNORMAL LOW (ref 36.0–46.0)
Hemoglobin: 9.4 g/dL — ABNORMAL LOW (ref 12.0–15.0)
MCH: 25.8 pg — ABNORMAL LOW (ref 26.0–34.0)
MCHC: 28.7 g/dL — ABNORMAL LOW (ref 30.0–36.0)
MCV: 89.8 fL (ref 80.0–100.0)
Platelets: 319 10*3/uL (ref 150–400)
RBC: 3.64 MIL/uL — ABNORMAL LOW (ref 3.87–5.11)
RDW: 24.4 % — ABNORMAL HIGH (ref 11.5–15.5)
WBC: 9.7 10*3/uL (ref 4.0–10.5)
nRBC: 0 % (ref 0.0–0.2)

## 2022-07-20 LAB — BASIC METABOLIC PANEL
Anion gap: 12 (ref 5–15)
BUN: 50 mg/dL — ABNORMAL HIGH (ref 8–23)
CO2: 33 mmol/L — ABNORMAL HIGH (ref 22–32)
Calcium: 8 mg/dL — ABNORMAL LOW (ref 8.9–10.3)
Chloride: 95 mmol/L — ABNORMAL LOW (ref 98–111)
Creatinine, Ser: 1.41 mg/dL — ABNORMAL HIGH (ref 0.44–1.00)
GFR, Estimated: 41 mL/min — ABNORMAL LOW (ref >60)
Glucose, Bld: 89 mg/dL (ref 70–99)
Potassium: 3.8 mmol/L (ref 3.5–5.1)
Sodium: 140 mmol/L (ref 135–145)

## 2022-07-20 LAB — APTT
aPTT: 97 seconds — ABNORMAL HIGH (ref 24–36)
aPTT: 75 seconds — ABNORMAL HIGH (ref 24–36)

## 2022-07-20 LAB — HEPARIN LEVEL (UNFRACTIONATED): Heparin Unfractionated: >1.1 IU/mL — ABNORMAL HIGH (ref 0.30–0.70)

## 2022-07-20 MED ORDER — METOPROLOL TARTRATE 5 MG/5ML IV SOLN: 5.0000 mg | Freq: Once | INTRAVENOUS | Status: DC

## 2022-07-20 MED ORDER — LEVALBUTEROL HCL 0.63 MG/3ML IN NEBU
0.6300 mg | INHALATION_SOLUTION | Freq: Four times a day (QID) | RESPIRATORY_TRACT | Status: DC | PRN
  Administered 2022-07-22 – 2022-08-12 (×2): 0.63 mg via RESPIRATORY_TRACT
  Filled 2022-07-20 (×2): qty 3

## 2022-07-20 MED ORDER — METOPROLOL TARTRATE 25 MG PO TABS
12.5000 mg | ORAL_TABLET | Freq: Three times a day (TID) | ORAL | Status: DC
  Administered 2022-07-20 – 2022-07-21 (×3): 12.5 mg via ORAL
  Filled 2022-07-20 (×3): qty 1

## 2022-07-20 MED ORDER — ALPRAZOLAM 0.25 MG PO TABS
0.2500 mg | ORAL_TABLET | Freq: Three times a day (TID) | ORAL | Status: DC | PRN
  Administered 2022-07-21: 0.25 mg via ORAL
  Filled 2022-07-20: qty 1

## 2022-07-20 MED ORDER — BUPROPION HCL ER (XL) 150 MG PO TB24
150.0000 mg | ORAL_TABLET | Freq: Every day | ORAL | Status: DC
  Administered 2022-07-20 – 2022-08-12 (×24): 150 mg via ORAL
  Filled 2022-07-20 (×24): qty 1

## 2022-07-20 MED ORDER — POLYETHYLENE GLYCOL 3350 17 G PO PACK: 17.0000 g | PACK | Freq: Every day | ORAL | Status: DC | PRN

## 2022-07-20 MED ORDER — APIXABAN 5 MG PO TABS
5.0000 mg | ORAL_TABLET | Freq: Two times a day (BID) | ORAL | Status: DC
  Administered 2022-07-20 – 2022-08-12 (×46): 5 mg via ORAL
  Filled 2022-07-20 (×47): qty 1

## 2022-07-20 NOTE — Progress Notes (Addendum)
ANTICOAGULATION CONSULT NOTE - Initial Consult  Pharmacy Consult for Heparin Drip Indication: atrial fibrillation  No Known Allergies  Patient Measurements: Height: '5\' 5"'$  (165.1 cm) Weight: 135 kg (297 lb 9.9 oz) IBW/kg (Calculated) : 57 Heparin Dosing Weight: 90.4 kg  Vital Signs: Temp: 97.9 F (36.6 C) (01/08 0742) Temp Source: Oral (01/08 0742) BP: 135/93 (01/08 1200) Pulse Rate: 126 (01/08 1200)  Labs: Recent Labs    07/21/2022 1523 08/06/2022 1717 07/18/22 0652 07/18/22 1456 07/18/22 1456 07/18/22 2044 07/19/22 0541 07/19/22 1146 07/19/22 1959 07/20/22 0246 07/20/22 0852  HGB 11.4*  --  11.1*  --   --   --  10.2*  --   --  9.4*  --   HCT 41.1  --  39.1  --   --   --  36.1  --   --  32.7*  --   PLT 410*  --  431*  --   --   --  317  --   --  319  --   APTT  --   --   --  176*   < > 63* 72*   < > 55* 97* 75*  LABPROT  --   --   --  35.4*  --   --   --   --   --   --   --   INR  --   --   --  3.6*  --   --   --   --   --   --   --   HEPARINUNFRC  --   --   --  >1.10*  --   --  >1.10*  --   --  >1.10*  --   CREATININE 1.61*  --  1.67*  --   --  1.69* 1.59*  --   --  1.41*  --   TROPONINIHS 24* 24*  --   --   --   --   --   --   --   --   --    < > = values in this interval not displayed.     Estimated Creatinine Clearance: 53.2 mL/min (A) (by C-G formula based on SCr of 1.41 mg/dL (H)).   Medical History: Past Medical History:  Diagnosis Date   CHF (congestive heart failure) ([place])    Diabetes mellitus without complication (HCC)    Hypothyroidism    Lymphedema    RLS (restless legs syndrome)    Sleep apnea    Thyroid cancer ([place]) 2007   Assessment: Retaj Hilbun is a 67 y.o. female presenting with tachycardia. PMH significant for thyroid cancer, hypothyroidism, HLD, T2DM, CKD3, pAF (on Eliquis), HFpEF, OSA on CPAP. Patient was on [facility] PTA per chart review. Last dose of Eliquis 1/5 @ 1030. Pharmacy has been consulted to initiate and manage heparin  infusion.   Baseline Labs: aPTT 176, HL >1.10, PT 35.4, INR 3.6, Hgb 11.1, Hct 39.1, Plt 431   Goal of Therapy:  Heparin level 0.3-0.7 units/ml aPTT 66-102 seconds Monitor platelets by anticoagulation protocol: Yes  Date Time aPTT/HL Rate/Comment  1/6 2044 63/---  1350/SUBtherapeutic 1/7 0541 72/>1.10 1500/aPTT therapeutic x1 1/7 1146 58/---  1500/SUBtherapeutic 1/7 1959 55/---  1700/SUBtherapeutic  1/8 0246 97/>1.10 1900/aPTT therapuetic x1 1/8 0852 75/---  1900/aPTT therapeutic x2   Plan: Discontinue heparin infusion and resume home Eliquis per MD Pharmacy will sign off and continue to monitor peripherally  Gretel Acre, PharmD PGY1 Pharmacy Resident 07/20/2022 1:04  PM   

## 2022-07-20 NOTE — TOC Initial Note (Signed)
Transition of Care [facility]) - Initial/Assessment Note    Patient Details  Name: Madison Mcbride MRN: [id number] Date of Birth: 1955/08/03  Transition [facility]) CM/SW Contact:    Madison Hutching, RN Phone Number: 07/20/2022, 4:22 PM  Clinical Narrative:                 Patient admitted to the hospital with Afib with RVR.  Patient came in from [facility] where she has been for a couple of months.  Patient's insurance declined to approved for rehab so she has been private paying.  Patient would like to return to [facility] at discharge, her eventual goal is to get back home.    Expected Discharge Plan: Skilled Nursing Facility Barriers to Discharge: Continued Medical Work up   Patient Goals and CMS Choice Patient states their goals for this hospitalization and ongoing recovery are:: to go back to [facility] CMS Medicare.gov Compare Post Acute Care list provided to:: Patient Choice offered to / list presented to : Patient      Expected Discharge Plan and Services   Discharge Planning Services: CM Consult Post Acute Care Choice: Resumption of Svcs/PTA Provider Living arrangements for the past 2 months: [place]                 DME Arranged: N/A DME Agency: NA       HH Arranged: NA [place] Agency: NA        Prior Living Arrangements/Services Living arrangements for the past 2 months: [place]   Patient language and need for interpreter reviewed:: Yes Do you feel safe going back to the place where you live?: Yes      Need for Family Participation in Patient Care: Yes (Comment) Care giver support system in place?: Yes (comment) Current home services: DME Criminal Activity/Legal Involvement Pertinent to Current Situation/Hospitalization: No - Comment as needed  Activities of Daily Living Home Assistive Devices/Equipment: Walker (specify type) ADL Screening (condition at time of admission) Patient's cognitive ability adequate to safely complete daily  activities?: Yes Is the patient deaf or have difficulty hearing?: No Does the patient have difficulty seeing, even when wearing glasses/contacts?: No Does the patient have difficulty concentrating, remembering, or making decisions?: No Patient able to express need for assistance with ADLs?: Yes Does the patient have difficulty dressing or bathing?: No Independently performs ADLs?: No Communication: Independent Dressing (OT): Needs assistance Is this a change from baseline?: Pre-admission baseline Grooming: Needs assistance Is this a change from baseline?: Pre-admission baseline Feeding: Independent Bathing: Needs assistance Is this a change from baseline?: Pre-admission baseline Toileting: Needs assistance Is this a change from baseline?: Pre-admission baseline In/Out Bed: Needs assistance Is this a change from baseline?: Pre-admission baseline Walks in Home: Needs assistance Is this a change from baseline?: Pre-admission baseline Does the patient have difficulty walking or climbing stairs?: Yes Weakness of Legs: Both Weakness of Arms/Hands: None  Permission Sought/Granted Permission sought to share information with : Facility Sport and exercise psychologist, Family Supports Permission granted to share information with : Yes, Verbal Permission Granted  Share Information with NAME: Madison Mcbride  Permission granted to share info w AGENCY: [facility]  Permission granted to share info w Relationship: spouse  Permission granted to share info w Contact Information: [phone]  Emotional Assessment Appearance:: Appears stated age Attitude/Demeanor/Rapport: Engaged Affect (typically observed): Accepting, Appropriate Orientation: : Oriented to Self, Oriented to Place, Oriented to  Time, Oriented to Situation Alcohol / Substance Use: Not Applicable Psych Involvement: No (  comment)  Admission diagnosis:  Acute pulmonary edema (HCC) [J81.0] Atrial fibrillation with RVR (HCC)  [I48.91] HCAP (healthcare-associated pneumonia) [J18.9] Acute on chronic respiratory failure with hypoxia and hypercapnia (HCC) [Y78.29, J96.22] Patient Active Problem List   Diagnosis Date Noted   Acute pulmonary edema ([place]) 07/20/2022   HAP (hospital-acquired pneumonia) 07/20/2022   Chronic atrial fibrillation with RVR ([place]) 08/02/2022   Thoracic aortic aneurysm ([place]) 07/21/2022   Hyperkalemia 07/19/2022   Hypotension 07/19/2022   Leukocytosis 08/01/2022   Normocytic anemia 07/18/2022   Paroxysmal atrial fibrillation with RVR ([place]) 07/16/2022   SIRS (systemic inflammatory response syndrome) ([place]) 07/15/2022   Tachycardia 07/15/2022   AKI (acute kidney injury) ([place]) 07/15/2022   UTI (urinary tract infection) 07/12/2022   HTN (hypertension) 06/30/2022   Type II diabetes mellitus with renal manifestations ([place]) 06/30/2022   Depression with anxiety 06/29/2022   Iron deficiency anemia 06/22/2022   Sepsis ([place]) 06/22/2022   Myocardial injury 06/16/2022   Atrial fibrillation with RVR ([place]) 07/11/2022   Acute kidney injury ([place]) 05/20/2022   Chronic hypoxic respiratory failure, on home oxygen therapy ([place]) 05/20/2022   Congestive heart failure (CHF) ([place]) 05/20/2022   Microcytic anemia 05/20/2022   Chronic venous stasis dermatitis of both lower extremities 05/20/2022   Pressure ulcer of buttock 05/20/2022   Inflammatory arthritis 05/20/2022   Sinus bradycardia 05/20/2022   Dysuria 05/20/2022   PAF (paroxysmal atrial fibrillation) ([place]) 03/08/2022   Acute on chronic respiratory failure with hypoxia and hypercapnia (HCC) 03/05/2022   Effusion of knee joint, left 56/21/3086   Chronic systolic CHF (congestive heart failure) ([place]) 03/19/2021   Acute on chronic heart failure ([place]) 03/14/2021   Acute respiratory failure with hypoxia ([place]) 03/14/2021   Diabetes mellitus without complication ([place]) 57/84/6962   HLD (hyperlipidemia) 04/09/2020   Depression 04/09/2020   Elevated troponin  04/09/2020   OSA (obstructive sleep apnea) 04/09/2020   Peripheral polyneuropathy 02/29/2020   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   Lumbar radiculopathy, acute    Acute midline low back pain without sciatica    Type 2 diabetes mellitus with diabetic neuropathy, without long-term current use of insulin ([place]) 02/21/2020   Hypothyroidism 02/21/2020   Morbid obesity with BMI of 50.0-59.9, adult ([place]) 02/21/2020   CAP (community acquired pneumonia) 02/21/2020   Bilateral cellulitis of lower leg 02/21/2020   Acute respiratory failure with hypoxia and hypercapnia ([place]) 02/21/2020   Acute renal failure superimposed on stage 3a chronic kidney disease ([place]) 02/21/2020   Chronic kidney disease, stage 3a ([place]) 01/27/2020   Transaminitis 01/27/2020   Lymphedema of both lower extremities 08/21/2016   Benign essential hypertension 05/18/2016   PCP:  Rusty Aus, MD Pharmacy:   [facility] [place] [facility] [address] [place] [address] Phone: [phone] Fax: [phone]     Social Determinants of Health (SDOH) Social History: SDOH Screenings   Food Insecurity: No Food Insecurity (07/18/2022)  Housing: Low Risk  (07/18/2022)  Transportation Needs: No Transportation Needs (07/18/2022)  Utilities: Not At Risk (07/18/2022)  Tobacco Use: Low Risk  (08/04/2022)   SDOH Interventions: Housing Interventions: Intervention Not Indicated   Readmission Risk Interventions    07/08/2022   12:43 PM 03/07/2022    4:12 PM  Readmission Risk Prevention Plan  Transportation Screening Complete Complete  PCP or Specialist Appt within 5-7 Days  Complete  Home Care Screening  Complete  Medication Review (RN CM)  Complete  Medication Review (RN Care Manager) Complete  PCP or Specialist appointment within 3-5 days of discharge Complete   SW Recovery Care/Counseling Consult Complete   Skilled Nursing Facility Complete

## 2022-07-20 NOTE — Consult Note (Signed)
PHARMACY CONSULT NOTE - ELECTROLYTES  Pharmacy Consult for Electrolyte Monitoring and Replacement   Recent Labs: Potassium (mmol/L)  Date Value  07/20/2022 3.8  01/18/2014 3.7   Magnesium (mg/dL)  Date Value  07/19/2022 2.4   Calcium (mg/dL)  Date Value  07/20/2022 8.0 (L)   Calcium, Total (mg/dL)  Date Value  01/18/2014 8.2 (L)   Albumin (g/dL)  Date Value  07/16/2022 3.2 (L)  01/17/2014 2.6 (L)   Phosphorus (mg/dL)  Date Value  07/18/2022 7.0 (H)   Sodium (mmol/L)  Date Value  07/20/2022 140  01/18/2014 138   Corrected Ca: 8.6 mg/dL  Assessment  Madison Mcbride is a 67 y.o. female presenting with tachycardia. PMH significant for thyroid cancer, hypothyroidism, HLD, T2DM, CKD3, pAF (on Eliquis), HFpEF, OSA on CPAP . Pharmacy has been consulted to monitor and replace electrolytes.  Diet: Regular (sodium restricted) Pertinent medications: lasix 40 mg IV BID, amiodarone 400 mg PO daily  Goal of Therapy: Electrolytes WNL  Plan:  No replacement needed Pharmacy will sign off and continue to monitor peripherally  Thank you for allowing pharmacy to be a part of this patient's care.  Gretel Acre, PharmD PGY1 Pharmacy Resident 07/20/2022 1:07 PM

## 2022-07-20 NOTE — Progress Notes (Signed)
ANTICOAGULATION CONSULT NOTE - Initial Consult  Pharmacy Consult for Heparin Drip Indication: atrial fibrillation  No Known Allergies  Patient Measurements: Height: '5\' 5"'$  (165.1 cm) Weight: 135 kg (297 lb 9.9 oz) IBW/kg (Calculated) : 57 Heparin Dosing Weight: 90.4 kg  Vital Signs: Temp: 98 F (36.7 C) (01/07 2300) Temp Source: Oral (01/07 2300) BP: 118/80 (01/07 2300) Pulse Rate: 121 (01/07 2300)  Labs: Recent Labs    07/22/2022 1523 07/28/2022 1717 07/18/22 0652 07/18/22 1456 07/18/22 1456 07/18/22 2044 07/19/22 0541 07/19/22 1146 07/19/22 1959 07/20/22 0246  HGB 11.4*  --  11.1*  --   --   --  10.2*  --   --  9.4*  HCT 41.1  --  39.1  --   --   --  36.1  --   --  32.7*  PLT 410*  --  431*  --   --   --  317  --   --  319  APTT  --   --   --  176*   < > 63* 72* 58* 55* 97*  LABPROT  --   --   --  35.4*  --   --   --   --   --   --   INR  --   --   --  3.6*  --   --   --   --   --   --   HEPARINUNFRC  --   --   --  >1.10*  --   --  >1.10*  --   --  >1.10*  CREATININE 1.61*  --  1.67*  --   --  1.69* 1.59*  --   --  1.41*  TROPONINIHS 24* 24*  --   --   --   --   --   --   --   --    < > = values in this interval not displayed.     Estimated Creatinine Clearance: 53.2 mL/min (A) (by C-G formula based on SCr of 1.41 mg/dL (H)).   Medical History: Past Medical History:  Diagnosis Date   CHF (congestive heart failure) (HCC)    Diabetes mellitus without complication (HCC)    Hypothyroidism    Lymphedema    RLS (restless legs syndrome)    Sleep apnea    Thyroid cancer ([place]) 2007   Assessment: Patient is a 67yo female admitted for pneumonia with a history of afib on Apixaban '5mg'$  twice daily prior to admission. Received last dose of Apixaban on 1/5 ~10:30am.  Baseline Labs: -HL >1.10 (due to Apixaban)  Goal of Therapy:  Heparin level 0.3-0.7 units/ml aPTT 66-102 seconds Monitor platelets by anticoagulation protocol: Yes    Plan: 1/8 @ 0246:  aPTT = 97,   HL = > 1.10 aPTT is therapeutic X 1 but HL still elevated  - Will continue to use aPTT to guide dosing --recheck aPTT 6 hours on 1/8 @ 0900 - recheck HL on 1/9 with AM Labs  --Daily CBC while on Heparin drip  Ruqayyah Lute D, PharmD 07/20/2022 4:01 AM

## 2022-07-20 NOTE — Progress Notes (Signed)
Pharmacy Antibiotic Note  Madison Mcbride is a 67 y.o. female  admitted on 07/26/2022 with pneumonia. PMH significant for  thyroid cancer, hypothyroidism, HLD, T2DM, CKD3, pAF (on Eliquis), HFpEF, OSA on CPAP. Pharmacy has been consulted for Zosyn  dosing.  Plan:  Day 3 of antibiotics Continue Zosyn 3.375 g IV Q8H Continue to monitor renal function and follow culture results  Height: '5\' 5"'$  (165.1 cm) Weight: 135 kg (297 lb 9.9 oz) IBW/kg (Calculated) : 57  Temp (24hrs), Avg:98.1 F (36.7 C), Min:97.9 F (36.6 C), Max:98.3 F (36.8 C)  Recent Labs  Lab 07/16/22 1501 08/02/2022 1523 07/18/2022 2301 07/18/22 0041 07/18/22 0652 07/18/22 2044 07/19/22 0541 07/20/22 0246  WBC 13.1* 14.2*  --   --  13.6*  --  9.5 9.7  CREATININE 1.35* 1.61*  --   --  1.67* 1.69* 1.59* 1.41*  LATICACIDVEN  --   --  3.7* 2.9*  --   --   --   --      Estimated Creatinine Clearance: 53.2 mL/min (A) (by C-G formula based on SCr of 1.41 mg/dL (H)).    No Known Allergies  Antimicrobials this admission: 1/5 Vancomycin, Cefepime, Azithromycin x 1  1/6 Zosyn >>   Microbiology results: 1/1 UCx: NG final  1/5 BCx: NG3D 1/6 MRSA PCR: negative 1/6 RVPP: negative  Thank you for allowing pharmacy to be a part of this patient's care.  Gretel Acre, PharmD PGY1 Pharmacy Resident 07/20/2022 1:14 PM

## 2022-07-20 NOTE — NC FL2 (Signed)
[place] MEDICAID FL2 LEVEL OF CARE FORM     IDENTIFICATION  Patient Name: Madison Mcbride Birthdate: 07/11/1956 Sex: female Admission Date (Current Location): 07/19/2022  [place] and [place] Number:  Engineering geologist and Address:  [facility], [address] [place], [place] [address]      Provider Number: [id number]  Attending Physician Name and Address:  Richarda Osmond, MD  Relative Name and Phone Number:  Mcbride [phone]- [phone]    Current Level of Care: Hospital Recommended Level of Care: [place] Prior Approval Number:    Date Approved/Denied:   PASRR Number: [id number]  Discharge Plan: SNF    Current Diagnoses: Patient Active Problem List   Diagnosis Date Noted   Acute pulmonary edema ([place]) 07/20/2022   HAP (hospital-acquired pneumonia) 07/20/2022   Chronic atrial fibrillation with RVR ([place]) 07/16/2022   Thoracic aortic aneurysm ([place]) 07/25/2022   Hyperkalemia 08/06/2022   Hypotension 08/11/2022   Leukocytosis 08/05/2022   Normocytic anemia 07/23/2022   Paroxysmal atrial fibrillation with RVR ([place]) 07/16/2022   SIRS (systemic inflammatory response syndrome) ([place]) 07/15/2022   Tachycardia 07/15/2022   AKI (acute kidney injury) ([place]) 07/15/2022   UTI (urinary tract infection) 06/25/2022   HTN (hypertension) 07/07/2022   Type II diabetes mellitus with renal manifestations ([place]) 06/14/2022   Depression with anxiety 07/06/2022   Iron deficiency anemia 06/30/2022   Sepsis ([place]) 06/14/2022   Myocardial injury 07/08/2022   Atrial fibrillation with RVR ([place]) 06/24/2022   Acute kidney injury ([place]) 05/20/2022   Chronic hypoxic respiratory failure, on home oxygen therapy ([place]) 05/20/2022   Congestive heart failure (CHF) ([place]) 05/20/2022   Microcytic anemia 05/20/2022   Chronic venous stasis dermatitis of both lower extremities 05/20/2022   Pressure ulcer of buttock 05/20/2022   Inflammatory  arthritis 05/20/2022   Sinus bradycardia 05/20/2022   Dysuria 05/20/2022   PAF (paroxysmal atrial fibrillation) ([place]) 03/08/2022   Acute on chronic respiratory failure with hypoxia and hypercapnia (HCC) 03/05/2022   Effusion of knee joint, left 76/72/0947   Chronic systolic CHF (congestive heart failure) ([place]) 03/19/2021   Acute on chronic heart failure ([place]) 03/14/2021   Acute respiratory failure with hypoxia ([place]) 03/14/2021   Diabetes mellitus without complication ([place]) 09/62/8366   HLD (hyperlipidemia) 04/09/2020   Depression 04/09/2020   Elevated troponin 04/09/2020   OSA (obstructive sleep apnea) 04/09/2020   Peripheral polyneuropathy 02/29/2020   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   Lumbar radiculopathy, acute    Acute midline low back pain without sciatica    Type 2 diabetes mellitus with diabetic neuropathy, without long-term current use of insulin ([place]) 02/21/2020   Hypothyroidism 02/21/2020   Morbid obesity with BMI of 50.0-59.9, adult ([place]) 02/21/2020   CAP (community acquired pneumonia) 02/21/2020   Bilateral cellulitis of lower leg 02/21/2020   Acute respiratory failure with hypoxia and hypercapnia ([place]) 02/21/2020   Acute renal failure superimposed on stage 3a chronic kidney disease ([place]) 02/21/2020   Chronic kidney disease, stage 3a ([place]) 01/27/2020   Transaminitis 01/27/2020   Lymphedema of both lower extremities 08/21/2016   Benign essential hypertension 05/18/2016    Orientation RESPIRATION BLADDER Height & Weight     Time, Self, Situation, Place  O2 ([place] 2L) Continent Weight: 135 kg Height:  '5\' 5"'$  (165.1 cm)  BEHAVIORAL SYMPTOMS/MOOD NEUROLOGICAL BOWEL NUTRITION STATUS      Continent Diet (see discharge summary)  AMBULATORY STATUS COMMUNICATION OF NEEDS Skin   Extensive Assist Verbally Normal  Personal Care Assistance Level of Assistance  Bathing, Feeding, Dressing Bathing Assistance:  Limited assistance Feeding assistance: Independent Dressing Assistance: Limited assistance     Functional Limitations Info  Sight, Hearing, Speech Sight Info: Adequate Hearing Info: Adequate Speech Info: Adequate    SPECIAL CARE FACTORS FREQUENCY  PT (By licensed PT), OT (By licensed OT)     PT Frequency: Min 4 times per week OT Frequency: min 2 times per week            Contractures Contractures Info: Not present    Additional Factors Info  Code Status, Allergies, Isolation Precautions Code Status Info: Full Allergies Info: NKA     Isolation Precautions Info: Contact ESBL     Current Medications (07/20/2022):  This is the current hospital active medication list Current Facility-Administered Medications  Medication Dose Route Frequency Provider Last Rate Last Admin   ALPRAZolam Duanne Moron) tablet 0.25 mg  0.25 mg Oral TID PRN Richarda Osmond, MD       amiodarone (PACERONE) tablet 400 mg  400 mg Oral Daily Scoggins, Amber, NP   400 mg at 07/20/22 0859   apixaban (ELIQUIS) tablet 5 mg  5 mg Oral BID Richarda Osmond, MD   5 mg at 07/20/22 0859   atorvastatin (LIPITOR) tablet 20 mg  20 mg Oral Daily Jani Gravel, MD   20 mg at 07/20/22 0859   buPROPion (WELLBUTRIN XL) 24 hr tablet 150 mg  150 mg Oral Daily Richarda Osmond, MD   150 mg at 07/20/22 0859   Chlorhexidine Gluconate Cloth 2 % PADS 6 each  6 each Topical Daily Armando Reichert, MD   6 each at 07/19/22 2107   furosemide (LASIX) injection 40 mg  40 mg Intravenous BID Armando Reichert, MD   40 mg at 07/20/22 0754   gabapentin (NEURONTIN) capsule 200 mg  200 mg Oral TID Oran Rein, MD   200 mg at 07/20/22 1600   levalbuterol (XOPENEX) nebulizer solution 0.63 mg  0.63 mg Nebulization Q6H PRN Richarda Osmond, MD       levothyroxine (SYNTHROID) tablet 250 mcg  250 mcg Oral Q0600 Jani Gravel, MD   250 mcg at 07/20/22 0525   metoprolol tartrate (LOPRESSOR) injection 5 mg  5 mg Intravenous Once [place]wood, Dwayne D, MD        metoprolol tartrate (LOPRESSOR) tablet 12.5 mg  12.5 mg Oral TID [place]wood, Dwayne D, MD   12.5 mg at 07/20/22 1401   pantoprazole (PROTONIX) EC tablet 40 mg  40 mg Oral Daily Jani Gravel, MD   40 mg at 07/20/22 0859   piperacillin-tazobactam (ZOSYN) IVPB 3.375 g  3.375 g Intravenous Q8H Dallie Piles, RPH 12.5 mL/hr at 07/20/22 1600 Infusion Verify at 07/20/22 1600   polyethylene glycol (MIRALAX / GLYCOLAX) packet 17 g  17 g Oral Daily PRN Lang Snow, NP       rOPINIRole (REQUIP) tablet 3 mg  3 mg Oral QHS Oran Rein, MD   3 mg at 07/19/22 2104     Discharge Medications: Please see discharge summary for a list of discharge medications.  Relevant Imaging Results:  Relevant Lab Results:   Additional Information Ss# [id number]  Shelbie Hutching, RN

## 2022-07-20 NOTE — Progress Notes (Signed)
Progress Note   Patient: Madison Mcbride OAC:[id number] DOB: [birth year] DOA: 08/11/2022     3 DOS: the patient was seen and examined on 07/20/2022   Brief hospital course: Madison Mcbride is a 67 y.o. female with medical history significant of thyroid cancer, hypothyroidism hyperlipidemia, Dm2, CKD stage 3, Pafib , CHFpEF, OSA on CPAP, was discharged earlier today to SNF and found to be tachycardic on intake and sent back to ED.  Pt husband states that she appeared diaphoretic and slightly dyspneic, and somnolent.   pt denies fever, chills, cough, cp, palp, n/v, abd pain, diarrhea, brbpr, black stool, dysuria, hematuria, flank pain .    In ED, T 97.9  P 110 R 26 Bp 114/72  pox 95% on 6L [place] TSH 1.771 Na 135, K 5.2, Bun 63, Creat 1.61 Wbc 14.2, Hgb 11.4, Plt 410 Ph 7.23, pCo2 94  p O2 38 Procalcitonin 0.54 Trop 24-> 24 BNP 2,302.4 CT showed no PE  Pt was given cardizem '10mg'$  iv x1 with slight benefit in ED, pt started on BIPAP for acute respiratory failure with hypoxia and hypercapnea.  Pt became hypotensive w sbp 70's and admitted to ICU. Patient given 30 cc/kg of fluids and started on broad-spectrum antibiotics Vanco cefepime and Flagyl for suspected sepsis. Patient remained hypotensive despite IVF boluses therefore was started on Levophed.    1/7 : Patient was weaned off Levophed. Her home medication of Lasix Cardizem and metoprolol was held as she was hypotensive.  Patient was weaned off BiPAP maintaining saturations 99 to 100% on 6 L.  1/8- continues on amiodarone. Continues to have heart rate 110-120s. She is asymptomatic. Telemetry appears to be sinus rhythm.   Assessment and Plan:  Acute Hypoxic and Hypercapnic Respiratory Failure- improved Pulmonary Edema, Possible RLL HAP -wean O2 as tolerated -Continue with pneumonia management with broad-spectrum antibiotics Zosyn  Afib with RVR- appears to be in sinus tach this morning. Asymptomatic. HR mostly in 120s. Patient was on  heparin gtt, unsure why. - cardiology consulted.   - continued discussions on medication modification vs intervention since HR is resistant to therapy. She was resistant on highest doses BB that BP would tolerate - dc heparin - continue home eliquis - continue amio, BB if BP will tolerate - echo was said to be ordered on admission but it is not so will get one now.    AKI : Possibly cardiorenal.  Baseline around 1-1.2 now presenting with 1.6>1.4.   - Continue to monitor input output, daily weights.   - Avoid nephrotoxic medication.  Avoid NSAIDs.  Hypothyroidism : Continue levothyroxine  Subjective: Patient seen and examined this morning. Stable on 2Lnc. Denies chest pain, palpitations, SOB. Her HR is consistently in 120s sinus tachycardic on monitor throughout encounter.  She states that cardiology came by quickly but did not give her any new information. She requests that the cardiologist talks to her husband about the plan.   Physical Exam: Vitals:   07/20/22 0600 07/20/22 0700 07/20/22 0742 07/20/22 0800  BP: 108/78 (!) 140/91 123/87 116/81  Pulse: (!) 120 (!) 121  (!) 120  Resp:    18  Temp:   97.9 F (36.6 C)   TempSrc:   Oral   SpO2: 90% 97%  97%  Weight:      Height:       Physical Exam Constitutional:      Appearance: She is well-developed. She is obese.  HENT:     Head: Normocephalic and atraumatic.  Eyes:     Pupils: Pupils are equal, round, and reactive to light.  Cardiovascular:     Rate and Rhythm: Tachycardia present. Rhythm irregular.     Heart sounds: No murmur heard.    No friction rub.  Pulmonary:     Effort: Tachypnea present.     Breath sounds: No rales.  Chest:     Chest wall: No tenderness.  Abdominal:     Palpations: Abdomen is soft.  Musculoskeletal:        General: Normal range of motion.     Cervical back: Normal range of motion.  Skin:    General: Skin is warm.  Neurological:     General: No focal deficit present.     Mental Status:  She is alert.  Psychiatric:        Mood and Affect: Mood normal.    Data Reviewed:  CBC    Component Value Date/Time   WBC 9.7 07/20/2022 0246   RBC 3.64 (L) 07/20/2022 0246   HGB 9.4 (L) 07/20/2022 0246   HGB 12.0 01/18/2014 0617   HCT 32.7 (L) 07/20/2022 0246   HCT 36.8 01/18/2014 0617   PLT 319 07/20/2022 0246   PLT 310 01/18/2014 0617   MCV 89.8 07/20/2022 0246   MCV 88 01/18/2014 0617   MCH 25.8 (L) 07/20/2022 0246   MCHC 28.7 (L) 07/20/2022 0246   RDW 24.4 (H) 07/20/2022 0246   RDW 16.2 (H) 01/18/2014 0617   LYMPHSABS 1.6 07/08/2022 1924   LYMPHSABS 1.4 01/18/2014 0617   MONOABS 0.7 07/08/2022 1924   MONOABS 1.0 (H) 01/18/2014 0617   EOSABS 0.3 07/08/2022 1924   EOSABS 0.2 01/18/2014 0617   BASOSABS 0.1 07/08/2022 1924   BASOSABS 0.1 01/18/2014 0617      Latest Ref Rng & Units 07/20/2022    2:46 AM 07/19/2022    5:41 AM 07/18/2022    8:44 PM  BMP  Glucose 70 - 99 mg/dL 89  67  78   BUN 8 - 23 mg/dL 50  62  67   Creatinine 0.44 - 1.00 mg/dL 1.41  1.59  1.69   Sodium 135 - 145 mmol/L 140  140  140   Potassium 3.5 - 5.1 mmol/L 3.8  4.2  4.6   Chloride 98 - 111 mmol/L 95  98  96   CO2 22 - 32 mmol/L 33  28  28   Calcium 8.9 - 10.3 mg/dL 8.0  8.0  8.2    Family Communication: None   Disposition: Status is: Inpatient Remains inpatient appropriate because: Hypoxic respiratory failure and  Afib with rvr   Planned Discharge Destination: Home  Time spent: 35 minutes  Author: Richarda Osmond, MD 07/20/2022 8:31 AM  For on call review [email or web address].

## 2022-07-20 NOTE — Plan of Care (Signed)

## 2022-07-20 NOTE — Progress Notes (Signed)
[facility] Cardiology    SUBJECTIVE: Patient presents with shortness of breath dyspnea respiratory distress cough congestion patient had rapid atrial fibrillation she was slightly hypotensive was unable to tolerate heart rate medications was discharged home and came back but now feels reasonably well shortness of breath is improved no fever chills or sweats no sputum production   Vitals:   07/20/22 0742 07/20/22 0800 07/20/22 0829 07/20/22 0900  BP: 123/87 116/81  (!) 183/168  Pulse:  (!) 120 (!) 122 (!) 117  Resp:  '18 18 20  '$ Temp: 97.9 F (36.6 C)     TempSrc: Oral     SpO2:  97% 97% 92%  Weight:      Height:         Intake/Output Summary (Last 24 hours) at 07/20/2022 0948 Last data filed at 07/20/2022 0900 Gross per 24 hour  Intake 963.72 ml  Output 1600 ml  Net -636.28 ml      PHYSICAL EXAM  General: Well developed, well nourished, in no acute distress HEENT:  Normocephalic and atramatic Neck:  No JVD.  Lungs: Clear bilaterally to auscultation and percussion. Heart: HRRR . Normal S1 and S2 without gallops or murmurs.  Abdomen: Bowel sounds are positive, abdomen soft and non-tender  Msk:  Back normal, normal gait. Normal strength and tone for age. Extremities: No clubbing, cyanosis or edema.   Neuro: Alert and oriented X 3. Psych:  Good affect, responds appropriately   LABS: Basic Metabolic Panel: Recent Labs    07/18/22 0652 07/18/22 2044 07/19/22 0541 07/20/22 0246  NA 139   < > 140 140  K 4.6   < > 4.2 3.8  CL 96*   < > 98 95*  CO2 31   < > 28 33*  GLUCOSE 96   < > 67* 89  BUN 66*   < > 62* 50*  CREATININE 1.67*   < > 1.59* 1.41*  CALCIUM 8.2*   < > 8.0* 8.0*  MG 2.6*  --  2.4  --   PHOS 7.0*  --   --   --    < > = values in this interval not displayed.   Liver Function Tests: No results for input(s): "AST", "ALT", "ALKPHOS", "BILITOT", "PROT", "ALBUMIN" in the last 72 hours. No results for input(s): "LIPASE", "AMYLASE" in the last 72 hours. CBC: Recent  Labs    07/19/22 0541 07/20/22 0246  WBC 9.5 9.7  HGB 10.2* 9.4*  HCT 36.1 32.7*  MCV 90.3 89.8  PLT 317 319   Cardiac Enzymes: No results for input(s): "CKTOTAL", "CKMB", "CKMBINDEX", "TROPONINI" in the last 72 hours. BNP: Invalid input(s): "POCBNP" D-Dimer: No results for input(s): "DDIMER" in the last 72 hours. Hemoglobin A1C: No results for input(s): "HGBA1C" in the last 72 hours. Fasting Lipid Panel: No results for input(s): "CHOL", "HDL", "LDLCALC", "TRIG", "CHOLHDL", "LDLDIRECT" in the last 72 hours. Thyroid Function Tests: Recent Labs    08/05/2022 1523  TSH 1.771   Anemia Panel: No results for input(s): "VITAMINB12", "FOLATE", "FERRITIN", "TIBC", "IRON", "RETICCTPCT" in the last 72 hours.  No results found.   Echo mildly depressed left ventricular function around 45%  TELEMETRY: Rapid atrial fibrillation rate of 120:  ASSESSMENT AND PLAN:  Principal Problem:   Paroxysmal atrial fibrillation with RVR (HCC) Active Problems:   Type 2 diabetes mellitus with diabetic neuropathy, without long-term current use of insulin (HCC)   Hypothyroidism   HLD (hyperlipidemia)   Elevated troponin   OSA (obstructive sleep apnea)  Acute respiratory failure with hypoxia (HCC)   Acute on chronic respiratory failure with hypoxia and hypercapnia (HCC)   Thoracic aortic aneurysm (HCC)   Hyperkalemia   Hypotension   Leukocytosis   Normocytic anemia    Plan Atrial fibrillation rapid ventricular response rate of around 120 would recommend continue amiodarone p.o. start low-dose metoprolol first IV and then p.o. Diabetes type 2 continue current medical therapy Obstructive sleep recommend sleep study CPAP weight loss Acute hypoxic respiratory failure possible pneumonia continue aggressive pulmonary therapy Correct electrolytes as necessary Long-term anticoagulation for atrial fibrillation More aggressive rate control with beta-blocker amiodarone   Yolonda Kida,  MD, 07/20/2022 9:48 AM

## 2022-07-21 DIAGNOSIS — I4891 Unspecified atrial fibrillation: Secondary | ICD-10-CM

## 2022-07-21 DIAGNOSIS — J189 Pneumonia, unspecified organism: Secondary | ICD-10-CM | POA: Diagnosis not present

## 2022-07-21 DIAGNOSIS — I480 Paroxysmal atrial fibrillation: Secondary | ICD-10-CM | POA: Diagnosis not present

## 2022-07-21 DIAGNOSIS — J810 Acute pulmonary edema: Secondary | ICD-10-CM | POA: Diagnosis not present

## 2022-07-21 DIAGNOSIS — E038 Other specified hypothyroidism: Secondary | ICD-10-CM

## 2022-07-21 LAB — CBC
HCT: 36.8 % (ref 36.0–46.0)
Hemoglobin: 10.3 g/dL — ABNORMAL LOW (ref 12.0–15.0)
MCH: 25.9 pg — ABNORMAL LOW (ref 26.0–34.0)
MCHC: 28 g/dL — ABNORMAL LOW (ref 30.0–36.0)
MCV: 92.5 fL (ref 80.0–100.0)
Platelets: 339 10*3/uL (ref 150–400)
RBC: 3.98 MIL/uL (ref 3.87–5.11)
RDW: 24.5 % — ABNORMAL HIGH (ref 11.5–15.5)
WBC: 9.8 10*3/uL (ref 4.0–10.5)
nRBC: 0.3 % — ABNORMAL HIGH (ref 0.0–0.2)

## 2022-07-21 LAB — COMPREHENSIVE METABOLIC PANEL
ALT: 132 U/L — ABNORMAL HIGH (ref 0–44)
AST: 118 U/L — ABNORMAL HIGH (ref 15–41)
Albumin: 3.4 g/dL — ABNORMAL LOW (ref 3.5–5.0)
Alkaline Phosphatase: 127 U/L — ABNORMAL HIGH (ref 38–126)
Anion gap: 10 (ref 5–15)
BUN: 41 mg/dL — ABNORMAL HIGH (ref 8–23)
CO2: 35 mmol/L — ABNORMAL HIGH (ref 22–32)
Calcium: 8.6 mg/dL — ABNORMAL LOW (ref 8.9–10.3)
Chloride: 95 mmol/L — ABNORMAL LOW (ref 98–111)
Creatinine, Ser: 1.22 mg/dL — ABNORMAL HIGH (ref 0.44–1.00)
GFR, Estimated: 48 mL/min — ABNORMAL LOW (ref >60)
Glucose, Bld: 109 mg/dL — ABNORMAL HIGH (ref 70–99)
Potassium: 3.9 mmol/L (ref 3.5–5.1)
Sodium: 140 mmol/L (ref 135–145)
Total Bilirubin: 1.4 mg/dL — ABNORMAL HIGH (ref 0.3–1.2)
Total Protein: 7.5 g/dL (ref 6.5–8.1)

## 2022-07-21 LAB — LEGIONELLA PNEUMOPHILA SEROGP 1 UR AG: L. pneumophila Serogp 1 Ur Ag: NEGATIVE

## 2022-07-21 LAB — GLUCOSE, CAPILLARY: Glucose-Capillary: 133 mg/dL — ABNORMAL HIGH (ref 70–99)

## 2022-07-21 MED ORDER — METOPROLOL TARTRATE 25 MG PO TABS
25.0000 mg | ORAL_TABLET | Freq: Three times a day (TID) | ORAL | Status: DC
  Administered 2022-07-21 – 2022-07-22 (×3): 25 mg via ORAL
  Filled 2022-07-21 (×3): qty 1

## 2022-07-21 MED ORDER — METOPROLOL TARTRATE 5 MG/5ML IV SOLN
5.0000 mg | Freq: Once | INTRAVENOUS | Status: AC
  Administered 2022-07-21: 5 mg via INTRAVENOUS
  Filled 2022-07-21: qty 5

## 2022-07-21 MED ORDER — SODIUM CHLORIDE 0.9 % IV SOLN
3.0000 g | Freq: Four times a day (QID) | INTRAVENOUS | Status: AC
  Administered 2022-07-21 – 2022-07-22 (×6): 3 g via INTRAVENOUS
  Filled 2022-07-21 (×3): qty 8
  Filled 2022-07-21: qty 3
  Filled 2022-07-21: qty 8
  Filled 2022-07-21: qty 3
  Filled 2022-07-21: qty 8

## 2022-07-21 MED ORDER — METOLAZONE 2.5 MG PO TABS
2.5000 mg | ORAL_TABLET | Freq: Every day | ORAL | Status: DC | PRN
  Administered 2022-07-23: 2.5 mg via ORAL
  Filled 2022-07-21 (×2): qty 1

## 2022-07-21 NOTE — Progress Notes (Signed)
Progress Note   Patient: Madison Mcbride OIB:[id number] DOB: 1955/10/23 DOA: 07/23/2022     4 DOS: the patient was seen and examined on 07/21/2022   Brief hospital course: Madison Mcbride is a 67 y.o. female with medical history significant of thyroid cancer, hypothyroidism hyperlipidemia, Dm2, CKD stage 3, Pafib , CHFpEF, OSA on CPAP, was discharged earlier today to SNF and found to be tachycardic on intake and sent back to ED.  Pt husband states that she appeared diaphoretic and slightly dyspneic, and somnolent.   pt denies fever, chills, cough, cp, palp, n/v, abd pain, diarrhea, brbpr, black stool, dysuria, hematuria, flank pain .    In ED, T 97.9  P 110 R 26 Bp 114/72  pox 95% on 6L [place] TSH 1.771 Na 135, K 5.2, Bun 63, Creat 1.61 Wbc 14.2, Hgb 11.4, Plt 410 Ph 7.23, pCo2 94  p O2 38 Procalcitonin 0.54 Trop 24-> 24 BNP 2,302.4 CT showed no PE  Pt was given cardizem '10mg'$  iv x1 with slight benefit in ED, pt started on BIPAP for acute respiratory failure with hypoxia and hypercapnea.  Pt became hypotensive w sbp 70's and admitted to ICU. Patient given 30 cc/kg of fluids and started on broad-spectrum antibiotics Vanco cefepime and Flagyl for suspected sepsis. Patient remained hypotensive despite IVF boluses therefore was started on Levophed.    1/7 : Patient was weaned off Levophed. Her home medication of Lasix Cardizem and metoprolol was held as she was hypotensive.  Patient was weaned off BiPAP maintaining saturations 99 to 100% on 6 L.  1/8- continues on amiodarone. Continues to have heart rate 110-120s. She is asymptomatic. Telemetry appears to be sinus rhythm.   Assessment and Plan:  Acute Hypoxic and Hypercapnic Respiratory Failure HAP- improved Pulmonary Edema, Possible RLL HAP -wean O2 as tolerated -zosyn --> unasyn   Afib with RVR- appears to be in sinus tach this morning. Asymptomatic. HR mostly in 120s.  - cardiology consulted.  - continue home eliquis - continue amio,  BB if BP will tolerate - echo was said to be ordered on admission but it is not so will get one now.    AKI- resolved Possibly cardiorenal.  Baseline around 1-1.2 now presenting with 1.6>1.4>1.22.   - Continue to monitor input output, daily weights.   - Avoid nephrotoxic medication.  Avoid NSAIDs.  HFrEF-  - continue statin - continue lasix + metolazone   Type II DM- well controlled without medication - monitor on labs  Psych- continue home medications including wellbutrin, gabapentin, requip  Hypothyroidism : Continue levothyroxine  Subjective: Patient seen and examined this morning. Stable on 2Lnc. Denies chest pain, palpitations, SOB. Her HR is consistently in 120s sinus tachycardic on monitor throughout encounter. Several questions which were all addressed at time of encounter.   Physical Exam: Vitals:   07/21/22 0300 07/21/22 0400 07/21/22 0500 07/21/22 0600  BP: 118/81 115/78 111/83 108/82  Pulse: (!) 119 (!) 117 (!) 116 (!) 117  Resp: 15     Temp:  97.6 F (36.4 C)    TempSrc:  Oral    SpO2: 95% 96% 96% 91%  Weight:   (!) 136.6 kg   Height:       Physical Exam Constitutional:      Appearance: She is well-developed. She is obese.  HENT:     Head: Normocephalic and atraumatic.  Eyes:     Pupils: Pupils are equal, round, and reactive to light.  Cardiovascular:     Rate  and Rhythm: Tachycardia present. Rhythm irregular.     Heart sounds: No murmur heard.    No friction rub.  Pulmonary:     Effort: Tachypnea present.     Breath sounds: No rales.  Chest:     Chest wall: No tenderness.  Abdominal:     Palpations: Abdomen is soft.  Musculoskeletal:        General: Normal range of motion.     Cervical back: Normal range of motion.  Skin:    General: Skin is warm.  Neurological:     General: No focal deficit present.     Mental Status: She is alert.  Psychiatric:        Mood and Affect: Mood normal.    Data Reviewed:  CBC    Component Value Date/Time    WBC 9.8 07/21/2022 0520   RBC 3.98 07/21/2022 0520   HGB 10.3 (L) 07/21/2022 0520   HGB 12.0 01/18/2014 0617   HCT 36.8 07/21/2022 0520   HCT 36.8 01/18/2014 0617   PLT 339 07/21/2022 0520   PLT 310 01/18/2014 0617   MCV 92.5 07/21/2022 0520   MCV 88 01/18/2014 0617   MCH 25.9 (L) 07/21/2022 0520   MCHC 28.0 (L) 07/21/2022 0520   RDW 24.5 (H) 07/21/2022 0520   RDW 16.2 (H) 01/18/2014 0617   LYMPHSABS 1.6 07/08/2022 1924   LYMPHSABS 1.4 01/18/2014 0617   MONOABS 0.7 07/08/2022 1924   MONOABS 1.0 (H) 01/18/2014 0617   EOSABS 0.3 07/08/2022 1924   EOSABS 0.2 01/18/2014 0617   BASOSABS 0.1 07/08/2022 1924   BASOSABS 0.1 01/18/2014 0617      Latest Ref Rng & Units 07/21/2022    5:20 AM 07/20/2022    2:46 AM 07/19/2022    5:41 AM  BMP  Glucose 70 - 99 mg/dL 109  89  67   BUN 8 - 23 mg/dL 41  50  62   Creatinine 0.44 - 1.00 mg/dL 1.22  1.41  1.59   Sodium 135 - 145 mmol/L 140  140  140   Potassium 3.5 - 5.1 mmol/L 3.9  3.8  4.2   Chloride 98 - 111 mmol/L 95  95  98   CO2 22 - 32 mmol/L 35  33  28   Calcium 8.9 - 10.3 mg/dL 8.6  8.0  8.0    Family Communication: None   Disposition: Status is: Inpatient Remains inpatient appropriate because: Hypoxic respiratory failure and  Afib with rvr   Planned Discharge Destination: SNF, pending cardiac stabilization  Time spent: 35 minutes  Author: Richarda Osmond, MD 07/21/2022 8:06 AM  For on call review [email or web address].

## 2022-07-21 NOTE — Progress Notes (Signed)
[facility] Cardiology    SUBJECTIVE: Patient resting in bed denies shortness of breath mild tachycardia rate of 120 denies worsening shortness of breath not very active being treated for pneumonia on anticoagulation with atrial fibrillation atrial flutter no chest pain   Vitals:   07/21/22 0900 07/21/22 1000 07/21/22 1100 07/21/22 1200  BP: 110/76 121/81 123/73 120/81  Pulse: (!) 119 (!) 121 (!) 122 (!) 122  Resp:  20 (!) 33 (!) 26  Temp:      TempSrc:      SpO2: (!) 86% 100% 97% 100%  Weight:      Height:         Intake/Output Summary (Last 24 hours) at 07/21/2022 1257 Last data filed at 07/21/2022 1000 Gross per 24 hour  Intake 150.12 ml  Output 1350 ml  Net -1199.88 ml      PHYSICAL EXAM  General: Well developed, well nourished, in no acute distress HEENT:  Normocephalic and atramatic Neck:  No JVD.  Lungs: Clear bilaterally to auscultation and percussion. Heart: Tachycardic. Normal S1 and S2 without gallops or murmurs.  Abdomen: Bowel sounds are positive, abdomen soft and non-tender  Msk:  Back normal, normal gait. Normal strength and tone for age. Extremities: No clubbing, cyanosis or edema.   Neuro: Alert and oriented X 3. Psych:  Good affect, responds appropriately   LABS: Basic Metabolic Panel: Recent Labs    07/19/22 0541 07/20/22 0246 07/21/22 0520  NA 140 140 140  K 4.2 3.8 3.9  CL 98 95* 95*  CO2 28 33* 35*  GLUCOSE 67* 89 109*  BUN 62* 50* 41*  CREATININE 1.59* 1.41* 1.22*  CALCIUM 8.0* 8.0* 8.6*  MG 2.4  --   --    Liver Function Tests: Recent Labs    07/21/22 0520  AST 118*  ALT 132*  ALKPHOS 127*  BILITOT 1.4*  PROT 7.5  ALBUMIN 3.4*   No results for input(s): "LIPASE", "AMYLASE" in the last 72 hours. CBC: Recent Labs    07/20/22 0246 07/21/22 0520  WBC 9.7 9.8  HGB 9.4* 10.3*  HCT 32.7* 36.8  MCV 89.8 92.5  PLT 319 339   Cardiac Enzymes: No results for input(s): "CKTOTAL", "CKMB", "CKMBINDEX", "TROPONINI" in the last 72  hours. BNP: Invalid input(s): "POCBNP" D-Dimer: No results for input(s): "DDIMER" in the last 72 hours. Hemoglobin A1C: No results for input(s): "HGBA1C" in the last 72 hours. Fasting Lipid Panel: No results for input(s): "CHOL", "HDL", "LDLCALC", "TRIG", "CHOLHDL", "LDLDIRECT" in the last 72 hours. Thyroid Function Tests: No results for input(s): "TSH", "T4TOTAL", "T3FREE", "THYROIDAB" in the last 72 hours.  Invalid input(s): "FREET3" Anemia Panel: No results for input(s): "VITAMINB12", "FOLATE", "FERRITIN", "TIBC", "IRON", "RETICCTPCT" in the last 72 hours.  No results found.   Echo pending previous echo with EF of around 45%  TELEMETRY: Atrial flutter rate of 120 nonspecific ST-T wave changes:  ASSESSMENT AND PLAN:  Principal Problem:   Paroxysmal atrial fibrillation with RVR (HCC) Active Problems:   Type 2 diabetes mellitus with diabetic neuropathy, without long-term current use of insulin (HCC)   Hypothyroidism   HLD (hyperlipidemia)   Elevated troponin   OSA (obstructive sleep apnea)   Acute respiratory failure with hypoxia (HCC)   Acute on chronic respiratory failure with hypoxia and hypercapnia (HCC)   Thoracic aortic aneurysm (HCC)   Hyperkalemia   Hypotension   Leukocytosis   Normocytic anemia   Acute pulmonary edema (HCC)   HAP (hospital-acquired pneumonia)    Plan Acute  hypoxic hypercapnic respiratory failure improved continue current therapy supplemental oxygen antibiotic therapy inhalers as necessary Atrial fibrillation atrial flutter rapid ventricular response will increase beta-blockade therapy from 12.53 times a day to 25 3 times a day to hopefully get the rate under 100 We will use IV metoprolol as needed for better rate control Continue anticoagulation Eliquis for anticoagulation Maintain amiodarone p.o. for rhythm control Acute on chronic renal insufficiency mild continue current management Obesity recommend significant weight loss exercise  portion control Possible obstructive sleep apnea recommend sleep study CPAP if indicated with regular weight loss Diabetes type 2 uncomplicated continue current management and therapy No invasive procedures scheduled at this stage   Yolonda Kida, MD 07/21/2022 12:57 PM

## 2022-07-22 DIAGNOSIS — J810 Acute pulmonary edema: Secondary | ICD-10-CM | POA: Diagnosis not present

## 2022-07-22 DIAGNOSIS — I5020 Unspecified systolic (congestive) heart failure: Secondary | ICD-10-CM | POA: Diagnosis not present

## 2022-07-22 DIAGNOSIS — I480 Paroxysmal atrial fibrillation: Secondary | ICD-10-CM | POA: Diagnosis not present

## 2022-07-22 DIAGNOSIS — J189 Pneumonia, unspecified organism: Secondary | ICD-10-CM | POA: Diagnosis not present

## 2022-07-22 LAB — CBC
HCT: 37 % (ref 36.0–46.0)
Hemoglobin: 10.4 g/dL — ABNORMAL LOW (ref 12.0–15.0)
MCH: 25.7 pg — ABNORMAL LOW (ref 26.0–34.0)
MCHC: 28.1 g/dL — ABNORMAL LOW (ref 30.0–36.0)
MCV: 91.6 fL (ref 80.0–100.0)
Platelets: 334 10*3/uL (ref 150–400)
RBC: 4.04 MIL/uL (ref 3.87–5.11)
RDW: 24.4 % — ABNORMAL HIGH (ref 11.5–15.5)
WBC: 10.6 10*3/uL — ABNORMAL HIGH (ref 4.0–10.5)
nRBC: 0.2 % (ref 0.0–0.2)

## 2022-07-22 LAB — CULTURE, BLOOD (ROUTINE X 2)
Culture: NO GROWTH
Culture: NO GROWTH
Special Requests: ADEQUATE

## 2022-07-22 LAB — COMPREHENSIVE METABOLIC PANEL
ALT: 98 U/L — ABNORMAL HIGH (ref 0–44)
AST: 80 U/L — ABNORMAL HIGH (ref 15–41)
Albumin: 3.1 g/dL — ABNORMAL LOW (ref 3.5–5.0)
Alkaline Phosphatase: 132 U/L — ABNORMAL HIGH (ref 38–126)
Anion gap: 12 (ref 5–15)
BUN: 40 mg/dL — ABNORMAL HIGH (ref 8–23)
CO2: 31 mmol/L (ref 22–32)
Calcium: 8.5 mg/dL — ABNORMAL LOW (ref 8.9–10.3)
Chloride: 96 mmol/L — ABNORMAL LOW (ref 98–111)
Creatinine, Ser: 1.26 mg/dL — ABNORMAL HIGH (ref 0.44–1.00)
GFR, Estimated: 47 mL/min — ABNORMAL LOW (ref >60)
Glucose, Bld: 86 mg/dL (ref 70–99)
Potassium: 4.4 mmol/L (ref 3.5–5.1)
Sodium: 139 mmol/L (ref 135–145)
Total Bilirubin: 1.2 mg/dL (ref 0.3–1.2)
Total Protein: 6.9 g/dL (ref 6.5–8.1)

## 2022-07-22 MED ORDER — METOPROLOL TARTRATE 50 MG PO TABS
50.0000 mg | ORAL_TABLET | Freq: Three times a day (TID) | ORAL | Status: DC
  Administered 2022-07-22 – 2022-07-29 (×21): 50 mg via ORAL
  Filled 2022-07-22 (×21): qty 1

## 2022-07-22 NOTE — Progress Notes (Signed)
[facility] Cardiology    SUBJECTIVE: Resting comfortably denies palpitations tachycardia chest pain shortness of breath is somewhat improved no fever chills   Vitals:   07/22/22 0801 07/22/22 0909 07/22/22 0934 07/22/22 1128  BP: 113/77   121/81  Pulse: (!) 117 (!) 101 (!) 119 (!) 118  Resp: 18   10  Temp: 97.6 F (36.4 C)   98.2 F (36.8 C)  TempSrc:      SpO2: (!) 87% 92%  95%  Weight:      Height:         Intake/Output Summary (Last 24 hours) at 07/22/2022 1225 Last data filed at 07/22/2022 9147 Gross per 24 hour  Intake 780 ml  Output 1125 ml  Net -345 ml      PHYSICAL EXAM  General: Well developed, well nourished, in no acute distress HEENT:  Normocephalic and atramatic Neck:  No JVD.  Lungs: Clear bilaterally to auscultation and percussion. Heart: HRRR . Normal S1 and S2 without gallops or murmurs.  Abdomen: Bowel sounds are positive, abdomen soft and non-tender  Msk:  Back normal, normal gait. Normal strength and tone for age. Extremities: No clubbing, cyanosis or edema.   Neuro: Alert and oriented X 3. Psych:  Good affect, responds appropriately   LABS: Basic Metabolic Panel: Recent Labs    07/21/22 0520 07/22/22 0541  NA 140 139  K 3.9 4.4  CL 95* 96*  CO2 35* 31  GLUCOSE 109* 86  BUN 41* 40*  CREATININE 1.22* 1.26*  CALCIUM 8.6* 8.5*   Liver Function Tests: Recent Labs    07/21/22 0520 07/22/22 0541  AST 118* 80*  ALT 132* 98*  ALKPHOS 127* 132*  BILITOT 1.4* 1.2  PROT 7.5 6.9  ALBUMIN 3.4* 3.1*   No results for input(s): "LIPASE", "AMYLASE" in the last 72 hours. CBC: Recent Labs    07/21/22 0520 07/22/22 0541  WBC 9.8 10.6*  HGB 10.3* 10.4*  HCT 36.8 37.0  MCV 92.5 91.6  PLT 339 334   Cardiac Enzymes: No results for input(s): "CKTOTAL", "CKMB", "CKMBINDEX", "TROPONINI" in the last 72 hours. BNP: Invalid input(s): "POCBNP" D-Dimer: No results for input(s): "DDIMER" in the last 72 hours. Hemoglobin A1C: No results for  input(s): "HGBA1C" in the last 72 hours. Fasting Lipid Panel: No results for input(s): "CHOL", "HDL", "LDLCALC", "TRIG", "CHOLHDL", "LDLDIRECT" in the last 72 hours. Thyroid Function Tests: No results for input(s): "TSH", "T4TOTAL", "T3FREE", "THYROIDAB" in the last 72 hours.  Invalid input(s): "FREET3" Anemia Panel: No results for input(s): "VITAMINB12", "FOLATE", "FERRITIN", "TIBC", "IRON", "RETICCTPCT" in the last 72 hours.  No results found.   Echo mildly depressed left ventricular function EF around 45%  TELEMETRY: Atrial flutter rate of 115 nonspecific ST-T changes:  ASSESSMENT AND PLAN:  Principal Problem:   Paroxysmal atrial fibrillation with RVR (HCC) Active Problems:   Type 2 diabetes mellitus with diabetic neuropathy, without long-term current use of insulin (HCC)   Hypothyroidism   HLD (hyperlipidemia)   Elevated troponin   OSA (obstructive sleep apnea)   Acute respiratory failure with hypoxia (HCC)   Acute on chronic respiratory failure with hypoxia and hypercapnia (HCC)   Thoracic aortic aneurysm (HCC)   Hyperkalemia   Hypotension   Leukocytosis   Normocytic anemia   Acute pulmonary edema (HCC)   HAP (hospital-acquired pneumonia)   HFrEF (heart failure with reduced ejection fraction) ([place])    Plan Atrial for flutter atrial fibrillation rapid ventricular sponsor rate of 120 will increase amiodarone load  increase metoprolol therapy continue anticoagulation Respiratory failure improved continue supplemental oxygen therapy inhalers as necessary Obstructive sleep apnea continue CPAP needed Chronic renal insufficiency stage III continue modest hydration avoid nephrotoxic drugs consider nephrology input Hyperlipidemia maintain statin therapy for hyperlipidemia management Obesity recommend modest weight loss exercise portion control Diabetes type 2 continue supplemental management Systolic congestive heart continue current therapy with diuretics.    Yolonda Kida, MD 07/22/2022 12:25 PM

## 2022-07-22 NOTE — Progress Notes (Signed)
2000 patient called out that she urinated in bed  2010 patient complaining of SOB SP02 WNL lungs sound tight with expiratory wheezing PRN breathing treatment given and patient placed on BIPAP patient husband at bedside 2100 patient feeling much better complete ADL care done husband went home for the night  2200 meds given as ordered then patient placed back on BIPAP

## 2022-07-22 NOTE — Progress Notes (Signed)
   07/22/22 2319  Assess: MEWS Score  ECG Heart Rate (!) 112  Resp 17  Level of Consciousness Alert  SpO2 97 %  O2 Device Bi-PAP  Patient Activity (if Appropriate) In bed  FiO2 (%) 30 %  Assess: MEWS Score  MEWS Temp 0  MEWS Systolic 0  MEWS Pulse 2  MEWS RR 0  MEWS LOC 0  MEWS Score 2  MEWS Score Color Yellow  Assess: if the MEWS score is Yellow or Red  Were vital signs taken at a resting state? Yes  Focused Assessment No change from prior assessment  Does the patient meet 2 or more of the SIRS criteria? No  Does the patient have a confirmed or suspected source of infection? Yes  Provider and Rapid Response Notified? No  MEWS guidelines implemented *See Row Information* No, previously yellow, continue vital signs every 4 hours  Assess: SIRS CRITERIA  SIRS Temperature  0  SIRS Pulse 1  SIRS Respirations  0  SIRS WBC 0  SIRS Score Sum  1     Patient has afib on bipap mews keeps going to yellow patient alert able to make needs known no signs of distress

## 2022-07-22 NOTE — Plan of Care (Signed)

## 2022-07-22 NOTE — TOC Progression Note (Signed)
Transition of Care [facility]) - Progression Note    Patient Details  Name: Madison Mcbride MRN: [id number] Date of Birth: October 25, 1955  Transition of Care [facility]) CM/SW Contact  Laurena Slimmer, RN Phone Number: 07/22/2022, 1:34 PM  Clinical Narrative:    Spoke with Tammy at [facility] to update on discharge readiness.    Expected Discharge Plan: [place] Barriers to Discharge: Continued Medical Work up  Expected Discharge Plan and Services   Discharge Planning Services: CM Consult Post Acute Care Choice: Resumption of Svcs/PTA Provider Living arrangements for the past 2 months: [place]                 DME Arranged: N/A DME Agency: NA       HH Arranged: NA [place] Agency: NA         Social Determinants of Health (SDOH) Interventions [place]: No Food Insecurity (07/18/2022)  Housing: Low Risk  (07/18/2022)  Transportation Needs: No Transportation Needs (07/18/2022)  Utilities: Not At Risk (07/18/2022)  Tobacco Use: Low Risk  (08/07/2022)    Readmission Risk Interventions    06/16/2022   12:43 PM 03/07/2022    4:12 PM  Readmission Risk Prevention Plan  Transportation Screening Complete Complete  PCP or Specialist Appt within 5-7 Days  Complete  Home Care Screening  Complete  Medication Review (RN CM)  Complete  Medication Review (RN Care Manager) Complete   PCP or Specialist appointment within 3-5 days of discharge Complete   SW Recovery Care/Counseling Consult Complete   Skilled Nursing Facility Complete

## 2022-07-22 NOTE — Progress Notes (Addendum)
Progress Note   Patient: Madison Mcbride PYK:[id number] DOB: Apr 12, 1956 DOA: 07/30/2022     5 DOS: the patient was seen and examined on 07/22/2022   Brief hospital course: Madison Mcbride is a 68 y.o. female with medical history significant of thyroid cancer, hypothyroidism hyperlipidemia, Dm2, CKD stage 3, Pafib , CHFpEF, OSA on CPAP, was discharged earlier today to SNF and found to be tachycardic on intake and sent back to ED.  Pt husband states that she appeared diaphoretic and slightly dyspneic, and somnolent.   pt denies fever, chills, cough, cp, palp, n/v, abd pain, diarrhea, brbpr, black stool, dysuria, hematuria, flank pain .    In ED, T 97.9  P 110 R 26 Bp 114/72  pox 95% on 6L [place] TSH 1.771 Na 135, K 5.2, Bun 63, Creat 1.61 Wbc 14.2, Hgb 11.4, Plt 410 Ph 7.23, pCo2 94  p O2 38 Procalcitonin 0.54 Trop 24-> 24 BNP 2,302.4 CT showed no PE  Pt was given cardizem '10mg'$  iv x1 with slight benefit in ED, pt started on BIPAP for acute respiratory failure with hypoxia and hypercapnea.  Pt became hypotensive w sbp 70's and admitted to ICU. Patient given 30 cc/kg of fluids and started on broad-spectrum antibiotics Vanco cefepime and Flagyl for suspected sepsis. Patient remained hypotensive despite IVF boluses therefore was started on Levophed.    1/7 : Patient was weaned off Levophed. Her home medication of Lasix Cardizem and metoprolol was held as she was hypotensive.  Patient was weaned off BiPAP maintaining saturations 99 to 100% on 6 L.  1/8- continues on amiodarone. Continues to have heart rate 110-120s. She is asymptomatic. Telemetry appears to be sinus rhythm.   1/9- remains sinus tachycardia and asymptomatic.   1/10- briefly rate controlled with medication modifications. Continues to be asymptomatic. Requiring oxygen, likely 2/2 volume overload.   Assessment and Plan:  Acute Hypoxic and Hypercapnic Respiratory Failure HAP- improved Pulmonary Edema, Possible RLL HAP -wean O2 as  tolerated -zosyn --> unasyn  - incentive spirometer  Afib with RVR- HR well controlled this morning but then increased again to 110s. Remains asymptomatic and hemodynamically stable.  - cardiology consulted.  - continue home eliquis - continue amio, BB if BP will tolerate   AKI- resolved Possibly cardiorenal.  Baseline around 1-1.2 now presenting with 1.6>1.4>1.22.   - Continue to monitor input output, daily weights.   - Avoid nephrotoxic medication.  Avoid NSAIDs.  HFrEF- echo this admission shows EF 45-50%. Hypervolemic on exam. - cardiology following - continue statin - continue lasix + metolazone   Type II DM- well controlled without medication. Hgb A1c 5.3 on admission.  - monitor BS on labs  Psych- continue home medications including wellbutrin, gabapentin, requip  Hypothyroidism : Continue levothyroxine  Subjective: Patient seen and examined this morning. Stable on 3Lnc. Denies chest pain, palpitations, SOB. Her HR is stable throughout encounter.   Physical Exam: Vitals:   07/22/22 0400 07/22/22 0403 07/22/22 0500 07/22/22 0600  BP:  107/81    Pulse:  88    Resp: '16 18 20 18  '$ Temp:  (!) 97.3 F (36.3 C)    TempSrc:  Axillary    SpO2:  95%    Weight:      Height:       Physical Exam Constitutional:      Appearance: She is well-developed. She is obese.  HENT:     Head: Normocephalic and atraumatic.  Eyes:     Pupils: Pupils are equal, round,  and reactive to light.  Cardiovascular:     Rate and Rhythm: Tachycardia present. Rhythm irregular.     Heart sounds: No murmur heard.    No friction rub.  Pulmonary:     Effort: Pulmonary effort is normal. Tachypnea present.     Breath sounds: Decreased breath sounds present. No rales.  Abdominal:     Palpations: Abdomen is soft.  Musculoskeletal:        General: Normal range of motion.     Cervical back: Normal range of motion.     Right lower leg: Edema present.     Left lower leg: Edema present.  Skin:     General: Skin is warm.  Neurological:     General: No focal deficit present.     Mental Status: She is alert.  Psychiatric:        Mood and Affect: Mood normal.     Data Reviewed:  CBC    Component Value Date/Time   WBC 10.6 (H) 07/22/2022 0541   RBC 4.04 07/22/2022 0541   HGB 10.4 (L) 07/22/2022 0541   HGB 12.0 01/18/2014 0617   HCT 37.0 07/22/2022 0541   HCT 36.8 01/18/2014 0617   PLT 334 07/22/2022 0541   PLT 310 01/18/2014 0617   MCV 91.6 07/22/2022 0541   MCV 88 01/18/2014 0617   MCH 25.7 (L) 07/22/2022 0541   MCHC 28.1 (L) 07/22/2022 0541   RDW 24.4 (H) 07/22/2022 0541   RDW 16.2 (H) 01/18/2014 0617   LYMPHSABS 1.6 07/08/2022 1924   LYMPHSABS 1.4 01/18/2014 0617   MONOABS 0.7 07/08/2022 1924   MONOABS 1.0 (H) 01/18/2014 0617   EOSABS 0.3 07/08/2022 1924   EOSABS 0.2 01/18/2014 0617   BASOSABS 0.1 07/08/2022 1924   BASOSABS 0.1 01/18/2014 0617      Latest Ref Rng & Units 07/22/2022    5:41 AM 07/21/2022    5:20 AM 07/20/2022    2:46 AM  BMP  Glucose 70 - 99 mg/dL 86  109  89   BUN 8 - 23 mg/dL 40  41  50   Creatinine 0.44 - 1.00 mg/dL 1.26  1.22  1.41   Sodium 135 - 145 mmol/L 139  140  140   Potassium 3.5 - 5.1 mmol/L 4.4  3.9  3.8   Chloride 98 - 111 mmol/L 96  95  95   CO2 22 - 32 mmol/L 31  35  33   Calcium 8.9 - 10.3 mg/dL 8.5  8.6  8.0    Family Communication: None at bedside  Disposition: Status is: Inpatient Remains inpatient appropriate because: Hypoxic respiratory failure and  Afib with rvr   Planned Discharge Destination: SNF, pending cardiac stabilization  Time spent: 35 minutes  Author: Richarda Osmond, MD 07/22/2022 7:44 AM  For on call review [email or web address].

## 2022-07-23 ENCOUNTER — Inpatient Hospital Stay: Payer: Medicare Other

## 2022-07-23 DIAGNOSIS — J189 Pneumonia, unspecified organism: Secondary | ICD-10-CM | POA: Diagnosis not present

## 2022-07-23 DIAGNOSIS — J810 Acute pulmonary edema: Secondary | ICD-10-CM | POA: Diagnosis not present

## 2022-07-23 DIAGNOSIS — I4891 Unspecified atrial fibrillation: Secondary | ICD-10-CM | POA: Diagnosis not present

## 2022-07-23 DIAGNOSIS — I480 Paroxysmal atrial fibrillation: Secondary | ICD-10-CM | POA: Diagnosis not present

## 2022-07-23 LAB — COMPREHENSIVE METABOLIC PANEL
ALT: 134 U/L — ABNORMAL HIGH (ref 0–44)
AST: 179 U/L — ABNORMAL HIGH (ref 15–41)
Albumin: 3 g/dL — ABNORMAL LOW (ref 3.5–5.0)
Alkaline Phosphatase: 137 U/L — ABNORMAL HIGH (ref 38–126)
Anion gap: 12 (ref 5–15)
BUN: 46 mg/dL — ABNORMAL HIGH (ref 8–23)
CO2: 32 mmol/L (ref 22–32)
Calcium: 8.3 mg/dL — ABNORMAL LOW (ref 8.9–10.3)
Chloride: 94 mmol/L — ABNORMAL LOW (ref 98–111)
Creatinine, Ser: 1.67 mg/dL — ABNORMAL HIGH (ref 0.44–1.00)
GFR, Estimated: 33 mL/min — ABNORMAL LOW (ref >60)
Glucose, Bld: 91 mg/dL (ref 70–99)
Potassium: 4.2 mmol/L (ref 3.5–5.1)
Sodium: 138 mmol/L (ref 135–145)
Total Bilirubin: 1.8 mg/dL — ABNORMAL HIGH (ref 0.3–1.2)
Total Protein: 6.7 g/dL (ref 6.5–8.1)

## 2022-07-23 LAB — CBC
HCT: 37.5 % (ref 36.0–46.0)
Hemoglobin: 10.7 g/dL — ABNORMAL LOW (ref 12.0–15.0)
MCH: 25.8 pg — ABNORMAL LOW (ref 26.0–34.0)
MCHC: 28.5 g/dL — ABNORMAL LOW (ref 30.0–36.0)
MCV: 90.6 fL (ref 80.0–100.0)
Platelets: 334 10*3/uL (ref 150–400)
RBC: 4.14 MIL/uL (ref 3.87–5.11)
RDW: 24.3 % — ABNORMAL HIGH (ref 11.5–15.5)
WBC: 11.6 10*3/uL — ABNORMAL HIGH (ref 4.0–10.5)
nRBC: 0.3 % — ABNORMAL HIGH (ref 0.0–0.2)

## 2022-07-23 LAB — GLUCOSE, CAPILLARY: Glucose-Capillary: 87 mg/dL (ref 70–99)

## 2022-07-23 LAB — TROPONIN I (HIGH SENSITIVITY): Troponin I (High Sensitivity): 29 ng/L — ABNORMAL HIGH (ref <18)

## 2022-07-23 MED ORDER — AMIODARONE HCL 200 MG PO TABS: 200.0000 mg | ORAL_TABLET | Freq: Every day | ORAL | Status: DC

## 2022-07-23 MED ORDER — ZINC OXIDE 40 % EX OINT
TOPICAL_OINTMENT | Freq: Three times a day (TID) | CUTANEOUS | Status: DC | PRN
  Filled 2022-07-23: qty 113

## 2022-07-23 MED ORDER — DILTIAZEM HCL 30 MG PO TABS
30.0000 mg | ORAL_TABLET | Freq: Four times a day (QID) | ORAL | Status: DC
  Administered 2022-07-23 – 2022-07-24 (×6): 30 mg via ORAL
  Filled 2022-07-23 (×6): qty 1

## 2022-07-23 MED ORDER — IPRATROPIUM-ALBUTEROL 0.5-2.5 (3) MG/3ML IN SOLN
3.0000 mL | Freq: Three times a day (TID) | RESPIRATORY_TRACT | Status: DC
  Administered 2022-07-23 – 2022-07-30 (×20): 3 mL via RESPIRATORY_TRACT
  Filled 2022-07-23 (×20): qty 3

## 2022-07-23 MED ORDER — ACETAMINOPHEN 325 MG PO TABS
650.0000 mg | ORAL_TABLET | Freq: Four times a day (QID) | ORAL | Status: DC | PRN
  Administered 2022-07-29 – 2022-07-30 (×2): 650 mg via ORAL
  Filled 2022-07-23 (×3): qty 2

## 2022-07-23 MED ORDER — FUROSEMIDE 10 MG/ML IJ SOLN
40.0000 mg | Freq: Four times a day (QID) | INTRAMUSCULAR | Status: DC
  Administered 2022-07-23 – 2022-07-24 (×3): 40 mg via INTRAVENOUS
  Filled 2022-07-23 (×3): qty 4

## 2022-07-23 MED ORDER — BUDESONIDE 0.25 MG/2ML IN SUSP
0.2500 mg | Freq: Two times a day (BID) | RESPIRATORY_TRACT | Status: DC
  Administered 2022-07-23 – 2022-08-12 (×39): 0.25 mg via RESPIRATORY_TRACT
  Filled 2022-07-23 (×40): qty 2

## 2022-07-23 MED ORDER — AMIODARONE HCL 200 MG PO TABS
400.0000 mg | ORAL_TABLET | Freq: Two times a day (BID) | ORAL | Status: DC
  Administered 2022-07-23: 400 mg via ORAL

## 2022-07-23 MED ORDER — IPRATROPIUM-ALBUTEROL 0.5-2.5 (3) MG/3ML IN SOLN
3.0000 mL | RESPIRATORY_TRACT | Status: DC
  Administered 2022-07-23: 3 mL via RESPIRATORY_TRACT
  Filled 2022-07-23: qty 3

## 2022-07-23 NOTE — Progress Notes (Signed)
Patient reports feeling hot, however patient arms are cold and clammy to touch.   BP WNL, HR slightly tach still, RR shallow with accessory muscle usage.   Patient had episode of tightness and SOB prior to shift change.     MD aware  Neb treatments changed to scheduled from PRN

## 2022-07-23 NOTE — Progress Notes (Signed)
   07/23/22 0316  Assess: MEWS Score  Temp 97.6 F (36.4 C)  BP 112/85  MAP (mmHg) 94  ECG Heart Rate (!) 115  Resp 19  Level of Consciousness Alert  SpO2 100 %  O2 Device Bi-PAP  FiO2 (%) 30 %  Assess: MEWS Score  MEWS Temp 0  MEWS Systolic 0  MEWS Pulse 2  MEWS RR 0  MEWS LOC 0  MEWS Score 2  MEWS Score Color Yellow  Assess: if the MEWS score is Yellow or Red  Were vital signs taken at a resting state? Yes  Focused Assessment No change from prior assessment  Does the patient meet 2 or more of the SIRS criteria? No  Does the patient have a confirmed or suspected source of infection? Yes  Provider and Rapid Response Notified? No  MEWS guidelines implemented *See Row Information* No, previously yellow, continue vital signs every 4 hours  Treat  Pain Scale 0-10  Pain Score 0  Assess: SIRS CRITERIA  SIRS Temperature  0  SIRS Pulse 1  SIRS Respirations  0  SIRS WBC 0  SIRS Score Sum  1   No concerns noted patient alert

## 2022-07-23 NOTE — Progress Notes (Signed)
[facility] Cardiology    SUBJECTIVE: Patient states to be doing reasonably well improved shortness of breath denies palpitations or tachycardia   Vitals:   07/22/22 2319 07/23/22 0316 07/23/22 0433 07/23/22 0500  BP: 109/87 112/85 117/80   Pulse: (!) 111  (!) 115   Resp: '17 19 19   '$ Temp: (!) 97.5 F (36.4 C) 97.6 F (36.4 C) 97.6 F (36.4 C)   TempSrc: Axillary Axillary    SpO2: 97% 100% 100%   Weight:    135.8 kg  Height:         Intake/Output Summary (Last 24 hours) at 07/23/2022 0735 Last data filed at 07/23/2022 0600 Gross per 24 hour  Intake 290.53 ml  Output 750 ml  Net -459.47 ml      PHYSICAL EXAM  General: Well developed, well nourished, in no acute distress HEENT:  Normocephalic and atramatic Neck:  No JVD.  Lungs: Clear bilaterally to auscultation and percussion. Heart: Irregular irregular. Normal S1 and S2 without gallops or murmurs.  Abdomen: Bowel sounds are positive, abdomen soft and non-tender  Msk:  Back normal, normal gait. Normal strength and tone for age. Extremities: No clubbing, cyanosis or edema.   Neuro: Alert and oriented X 3. Psych:  Good affect, responds appropriately   LABS: Basic Metabolic Panel: Recent Labs    07/22/22 0541 07/23/22 0324  NA 139 138  K 4.4 4.2  CL 96* 94*  CO2 31 32  GLUCOSE 86 91  BUN 40* 46*  CREATININE 1.26* 1.67*  CALCIUM 8.5* 8.3*   Liver Function Tests: Recent Labs    07/22/22 0541 07/23/22 0324  AST 80* 179*  ALT 98* 134*  ALKPHOS 132* 137*  BILITOT 1.2 1.8*  PROT 6.9 6.7  ALBUMIN 3.1* 3.0*   No results for input(s): "LIPASE", "AMYLASE" in the last 72 hours. CBC: Recent Labs    07/22/22 0541 07/23/22 0324  WBC 10.6* 11.6*  HGB 10.4* 10.7*  HCT 37.0 37.5  MCV 91.6 90.6  PLT 334 334   Cardiac Enzymes: No results for input(s): "CKTOTAL", "CKMB", "CKMBINDEX", "TROPONINI" in the last 72 hours. BNP: Invalid input(s): "POCBNP" D-Dimer: No results for input(s): "DDIMER" in the last  72 hours. Hemoglobin A1C: No results for input(s): "HGBA1C" in the last 72 hours. Fasting Lipid Panel: No results for input(s): "CHOL", "HDL", "LDLCALC", "TRIG", "CHOLHDL", "LDLDIRECT" in the last 72 hours. Thyroid Function Tests: No results for input(s): "TSH", "T4TOTAL", "T3FREE", "THYROIDAB" in the last 72 hours.  Invalid input(s): "FREET3" Anemia Panel: No results for input(s): "VITAMINB12", "FOLATE", "FERRITIN", "TIBC", "IRON", "RETICCTPCT" in the last 72 hours.  No results found.   Echo mildly depressed left ventricular function EF around 45%  TELEMETRY: Atrial fibrillation rate of 105 nonspecific findings:  ASSESSMENT AND PLAN:  Principal Problem:   Paroxysmal atrial fibrillation with RVR (HCC) Active Problems:   Type 2 diabetes mellitus with diabetic neuropathy, without long-term current use of insulin (HCC)   Hypothyroidism   HLD (hyperlipidemia)   Elevated troponin   OSA (obstructive sleep apnea)   Acute respiratory failure with hypoxia (HCC)   Acute on chronic respiratory failure with hypoxia and hypercapnia (HCC)   Thoracic aortic aneurysm (HCC)   Hyperkalemia   Hypotension   Leukocytosis   Normocytic anemia   Acute pulmonary edema (HCC)   HAP (hospital-acquired pneumonia)   HFrEF (heart failure with reduced ejection fraction) ([place])   Plan Atrial fibrillation atrial flutter with tachycardia rate somewhat improved at 105 continue metoprolol diltiazem amiodarone  Continue anticoagulation for atrial fibrillation Agree with supplemental oxygen and inhalers for respiratory failure Mild cardiomyopathy continue heart failure therapy including diuretics Acute on chronic renal insufficiency stage III continue current therapy Increase activity recommend physical therapy occupational therapy    Yolonda Kida, MD 07/23/2022 7:35 AM

## 2022-07-23 NOTE — Progress Notes (Signed)
Progress Note   Patient: Madison Mcbride DTO:[id number] DOB: Jul 21, 1955 DOA: 08/01/2022     6 DOS: the patient was seen and examined on 07/23/2022   Brief hospital course: Madison Mcbride is a 67 y.o. female with medical history significant of thyroid cancer, hypothyroidism hyperlipidemia, Dm2, CKD stage 3, Pafib , CHFpEF, OSA on CPAP, was discharged earlier today to SNF and found to be tachycardic on intake and sent back to ED.  Pt husband states that she appeared diaphoretic and slightly dyspneic, and somnolent.   pt denies fever, chills, cough, cp, palp, n/v, abd pain, diarrhea, brbpr, black stool, dysuria, hematuria, flank pain .    In ED, T 97.9  P 110 R 26 Bp 114/72  pox 95% on 6L [place] TSH 1.771 Na 135, K 5.2, Bun 63, Creat 1.61 Wbc 14.2, Hgb 11.4, Plt 410 Ph 7.23, pCo2 94  p O2 38 Procalcitonin 0.54 Trop 24-> 24 BNP 2,302.4 CT showed no PE  Pt was given cardizem '10mg'$  iv x1 with slight benefit in ED, pt started on BIPAP for acute respiratory failure with hypoxia and hypercapnea.  Pt became hypotensive w sbp 70's and admitted to ICU. Patient given 30 cc/kg of fluids and started on broad-spectrum antibiotics Vanco cefepime and Flagyl for suspected sepsis. Patient remained hypotensive despite IVF boluses therefore was started on Levophed.    1/7 : Patient was weaned off Levophed. Her home medication of Lasix Cardizem and metoprolol was held as she was hypotensive.  Patient was weaned off BiPAP maintaining saturations 99 to 100% on 6 L.  1/8- continues on amiodarone. Continues to have heart rate 110-120s. She is asymptomatic. Telemetry appears to be sinus rhythm.   1/9- remains sinus tachycardia and asymptomatic.   1/10- briefly rate controlled with medication modifications but then returns to sinus tachycardia in 120s. Continues to be asymptomatic. Requiring oxygen, likely 2/2 volume overload.   1/11- chest xray shows edema. No consolidation. Increased WOB intermittently so placed on  bipap by nursing. Breathing while asleep very irregular. Troponin negative.   Assessment and Plan:  Acute Hypoxic and Hypercapnic Respiratory Failure- 2/2 OHS and hypervolemia Possible HAP- Chest xray shows Pulmonary Edema, Possible RLL infiltrate on admission. Repeat chest xray 1/11 shows continued vascular congestion/edema worsened from admission. -wean O2 as tolerated -zosyn (1/6-1/9) --> unasyn (1/9- present) - incentive spirometer q1hr while awake - continue diuresis as discussed below - CPAP PRN - continue scheduled duonebs and PRN - continue BID steroid inhaler  Afib with RVR- HR remains elevated to 110s. Remains asymptomatic and hemodynamically stable.  - cardiology consulted.   - continue amio- now at '400mg'$  BID, BB at increasing dose- now at metoprolol '50mg'$  TID - continue home eliquis - continuous tele   AKI- resolved and resumed with increased diuresis. Baseline around 1-1.2 now presenting with 1.6>1.4>1.22>>1.67.   - Continue to monitor input output, daily weights.   - Avoid nephrotoxic medication.  HFrEF- echo this admission shows EF 45-50%. Hypervolemic on exam. - cardiology following - continue statin - continue lasix + metolazone  - daily weights - strict I/O  Type II DM- well controlled without medication. Hgb A1c 5.3 on admission.  - monitor BS on labs  Psych- continue home medications including wellbutrin, gabapentin, requip  Hypothyroidism : Continue levothyroxine  Subjective: Patient seen and examined this morning. Stable on 4Lnc. Denies chest pain, palpitations, SOB. Her HR is stable throughout encounter. She states that she has not been utilizing her incentive spirometer but that she will start.  Physical Exam: Vitals:   07/22/22 2319 07/23/22 0316 07/23/22 0433 07/23/22 0500  BP: 109/87 112/85 117/80   Pulse: (!) 111  (!) 115   Resp: '17 19 19   '$ Temp: (!) 97.5 F (36.4 C) 97.6 F (36.4 C) 97.6 F (36.4 C)   TempSrc: Axillary Axillary    SpO2:  97% 100% 100%   Weight:    135.8 kg  Height:       Physical Exam Constitutional:      Appearance: She is well-developed. She is obese.  HENT:     Head: Normocephalic and atraumatic.  Eyes:     Pupils: Pupils are equal, round, and reactive to light.  Cardiovascular:     Rate and Rhythm: Tachycardia present. Rhythm irregular.     Heart sounds: No murmur heard.    No friction rub.  Pulmonary:     Effort: Pulmonary effort is normal. Tachypnea present.     Breath sounds: Decreased breath sounds present. No rales.  Abdominal:     Palpations: Abdomen is soft.  Musculoskeletal:        General: Normal range of motion.     Cervical back: Normal range of motion.     Right lower leg: Edema present.     Left lower leg: Edema present.  Skin:    General: Skin is warm.  Neurological:     General: No focal deficit present.     Mental Status: She is alert.  Psychiatric:        Mood and Affect: Mood normal.     Data Reviewed:  CBC    Component Value Date/Time   WBC 11.6 (H) 07/23/2022 0324   RBC 4.14 07/23/2022 0324   HGB 10.7 (L) 07/23/2022 0324   HGB 12.0 01/18/2014 0617   HCT 37.5 07/23/2022 0324   HCT 36.8 01/18/2014 0617   PLT 334 07/23/2022 0324   PLT 310 01/18/2014 0617   MCV 90.6 07/23/2022 0324   MCV 88 01/18/2014 0617   MCH 25.8 (L) 07/23/2022 0324   MCHC 28.5 (L) 07/23/2022 0324   RDW 24.3 (H) 07/23/2022 0324   RDW 16.2 (H) 01/18/2014 0617   LYMPHSABS 1.6 07/08/2022 1924   LYMPHSABS 1.4 01/18/2014 0617   MONOABS 0.7 07/08/2022 1924   MONOABS 1.0 (H) 01/18/2014 0617   EOSABS 0.3 07/08/2022 1924   EOSABS 0.2 01/18/2014 0617   BASOSABS 0.1 07/08/2022 1924   BASOSABS 0.1 01/18/2014 0617      Latest Ref Rng & Units 07/23/2022    3:24 AM 07/22/2022    5:41 AM 07/21/2022    5:20 AM  BMP  Glucose 70 - 99 mg/dL 91  86  109   BUN 8 - 23 mg/dL 46  40  41   Creatinine 0.44 - 1.00 mg/dL 1.67  1.26  1.22   Sodium 135 - 145 mmol/L 138  139  140   Potassium 3.5 - 5.1  mmol/L 4.2  4.4  3.9   Chloride 98 - 111 mmol/L 94  96  95   CO2 22 - 32 mmol/L 32  31  35   Calcium 8.9 - 10.3 mg/dL 8.3  8.5  8.6    Family Communication: husband on phone  Disposition: Status is: Inpatient Remains inpatient appropriate because: Hypoxic respiratory failure and  Afib with rvr   Planned Discharge Destination: SNF, pending cardiac stabilization  Time spent: 35 minutes  Author: Richarda Osmond, MD 07/23/2022 7:49 AM  For on call review [email or web address].

## 2022-07-23 NOTE — Plan of Care (Signed)
  Problem: Education: Goal: Knowledge of General Education information will improve Description: Including pain rating scale, medication(s)/side effects and non-pharmacologic comfort measures Outcome: Progressing   Problem: Health Behavior/Discharge Planning: Goal: Ability to manage health-related needs will improve Outcome: Progressing   Problem: Clinical Measurements: Goal: Ability to maintain clinical measurements within normal limits will improve Outcome: Progressing Goal: Will remain free from infection Outcome: Progressing Goal: Diagnostic test results will improve Outcome: Progressing Goal: Respiratory complications will improve Outcome: Progressing Goal: Cardiovascular complication will be avoided Outcome: Progressing   Problem: Activity: Goal: Risk for activity intolerance will decrease Outcome: Not Progressing Note: Patient total care    Problem: Nutrition: Goal: Adequate nutrition will be maintained Outcome: Progressing   Problem: Coping: Goal: Level of anxiety will decrease Outcome: Progressing   Problem: Elimination: Goal: Will not experience complications related to bowel motility Outcome: Not Progressing Note: incontinent Goal: Will not experience complications related to urinary retention Outcome: Progressing   Problem: Pain Managment: Goal: General experience of comfort will improve Outcome: Progressing   Problem: Safety: Goal: Ability to remain free from injury will improve Outcome: Not Progressing Note: Total care   Problem: Skin Integrity: Goal: Risk for impaired skin integrity will decrease Outcome: Not Progressing   Problem: Skin Integrity: Goal: Risk for impaired skin integrity will decrease Outcome: Not Progressing

## 2022-07-23 NOTE — Progress Notes (Addendum)
1100 Patient's O2 level dropped down to 83-84 %.   On assessment, patient was asleep.  Woke patient up and encouraged her to take some deep breaths.   While talking with patient, she seemed to be very groggy with some slight delay in verbal response.   Unable to place patient back on Bipap at this time since she just ate something less than 10 minutes ago.   With deep breaths, O2 improved to 91% ([place] 4 L).    1215 on Reassessment, patient still very groggy and speech slightly improved.   Placed patient back on Bipap.   Also noted no urine output in external canister.   Check patient's linen.  Still dry.  Bladder scan check- 0 ml urine noted.    MD ordered dose of metolazone to be given .  Will continue to monitor.    Messaged MD- made aware.   MD will come back by to reassess patient.

## 2022-07-24 DIAGNOSIS — I480 Paroxysmal atrial fibrillation: Secondary | ICD-10-CM | POA: Diagnosis not present

## 2022-07-24 LAB — URINE CULTURE
Culture: NO GROWTH
Special Requests: NORMAL

## 2022-07-24 LAB — BASIC METABOLIC PANEL
Anion gap: 12 (ref 5–15)
BUN: 58 mg/dL — ABNORMAL HIGH (ref 8–23)
CO2: 30 mmol/L (ref 22–32)
Calcium: 8.2 mg/dL — ABNORMAL LOW (ref 8.9–10.3)
Chloride: 94 mmol/L — ABNORMAL LOW (ref 98–111)
Creatinine, Ser: 2.26 mg/dL — ABNORMAL HIGH (ref 0.44–1.00)
GFR, Estimated: 23 mL/min — ABNORMAL LOW (ref >60)
Glucose, Bld: 89 mg/dL (ref 70–99)
Potassium: 4.1 mmol/L (ref 3.5–5.1)
Sodium: 136 mmol/L (ref 135–145)

## 2022-07-24 LAB — CBC
HCT: 37.1 % (ref 36.0–46.0)
Hemoglobin: 10.5 g/dL — ABNORMAL LOW (ref 12.0–15.0)
MCH: 25.9 pg — ABNORMAL LOW (ref 26.0–34.0)
MCHC: 28.3 g/dL — ABNORMAL LOW (ref 30.0–36.0)
MCV: 91.4 fL (ref 80.0–100.0)
Platelets: 359 10*3/uL (ref 150–400)
RBC: 4.06 MIL/uL (ref 3.87–5.11)
RDW: 24.6 % — ABNORMAL HIGH (ref 11.5–15.5)
WBC: 11 10*3/uL — ABNORMAL HIGH (ref 4.0–10.5)
nRBC: 0.4 % — ABNORMAL HIGH (ref 0.0–0.2)

## 2022-07-24 LAB — GLUCOSE, CAPILLARY
Glucose-Capillary: 62 mg/dL — ABNORMAL LOW (ref 70–99)
Glucose-Capillary: 71 mg/dL (ref 70–99)
Glucose-Capillary: 71 mg/dL (ref 70–99)
Glucose-Capillary: 94 mg/dL (ref 70–99)

## 2022-07-24 MED ORDER — HYDRALAZINE HCL 20 MG/ML IJ SOLN: 10.0000 mg | INTRAMUSCULAR | Status: DC | PRN

## 2022-07-24 MED ORDER — ONDANSETRON HCL 4 MG/2ML IJ SOLN
4.0000 mg | Freq: Four times a day (QID) | INTRAMUSCULAR | Status: DC | PRN
  Administered 2022-08-11: 4 mg via INTRAVENOUS
  Filled 2022-07-24 (×2): qty 2

## 2022-07-24 MED ORDER — SENNOSIDES-DOCUSATE SODIUM 8.6-50 MG PO TABS
1.0000 | ORAL_TABLET | Freq: Every evening | ORAL | Status: DC | PRN
  Administered 2022-08-07 – 2022-08-09 (×2): 1 via ORAL
  Filled 2022-07-24 (×2): qty 1

## 2022-07-24 MED ORDER — DILTIAZEM HCL ER COATED BEADS 120 MG PO CP24
120.0000 mg | ORAL_CAPSULE | Freq: Every day | ORAL | Status: DC
  Administered 2022-07-25 – 2022-07-29 (×6): 120 mg via ORAL
  Filled 2022-07-24 (×7): qty 1

## 2022-07-24 MED ORDER — SODIUM CHLORIDE 0.9 % IV SOLN
3.0000 g | Freq: Four times a day (QID) | INTRAVENOUS | Status: DC
  Filled 2022-07-24 (×2): qty 8

## 2022-07-24 MED ORDER — AMOXICILLIN-POT CLAVULANATE 875-125 MG PO TABS
1.0000 | ORAL_TABLET | Freq: Two times a day (BID) | ORAL | Status: DC
  Administered 2022-07-24 (×2): 1 via ORAL
  Filled 2022-07-24 (×3): qty 1

## 2022-07-24 MED ORDER — SODIUM CHLORIDE 0.9 % IV BOLUS
500.0000 mL | Freq: Once | INTRAVENOUS | Status: AC
  Administered 2022-07-24: 500 mL via INTRAVENOUS

## 2022-07-24 MED ORDER — GUAIFENESIN 100 MG/5ML PO LIQD: 5.0000 mL | ORAL | Status: DC | PRN

## 2022-07-24 MED ORDER — METOPROLOL TARTRATE 5 MG/5ML IV SOLN: 5.0000 mg | INTRAVENOUS | Status: DC | PRN

## 2022-07-24 MED ORDER — SODIUM CHLORIDE 0.9 % IV SOLN: INTRAVENOUS | Status: AC

## 2022-07-24 MED ORDER — OXYCODONE HCL 5 MG PO TABS: 5.0000 mg | ORAL_TABLET | ORAL | Status: DC | PRN

## 2022-07-24 NOTE — Evaluation (Addendum)
Physical Therapy Evaluation Patient Details Name: Moranda Billiot MRN: [id number] DOB: June 02, 1956 Today's Date: 07/24/2022  History of Present Illness  Patient is a 67 year old female with acute hypoxic and hypercapnic respiratory failure, A-fib with RVR. Medical history significant of thyroid cancer, hypothyroidism hyperlipidemia, Dm2, CKD stage 3, Pafib , CHFpEF, OSA on CPAP.    Clinical Impression  Patient is agreeable to PT. She reports she has not been up in a while. She is coming from SNF.  Today, patient required +2 person assistance with bed mobility. Poor sitting balance with left lateral lean with fatigue. Max A +2 person assistance required for scooting in bed. The patient is generally deconditioned as well and will need ongoing PT to maximize independence and decrease caregiver burden. SNF recommended at discharge.      Recommendations for follow up therapy are one component of a multi-disciplinary discharge planning process, led by the attending physician.  Recommendations may be updated based on patient status, additional functional criteria and insurance authorization.  Follow Up Recommendations Skilled nursing-short term rehab (<3 hours/day) Can patient physically be transported by private vehicle: No    Assistance Recommended at Discharge Frequent or constant Supervision/Assistance  Patient can return home with the following  Two people to help with walking and/or transfers;A lot of help with bathing/dressing/bathroom;Help with stairs or ramp for entrance;Assist for transportation;Assistance with cooking/housework;Direct supervision/assist for medications management;Direct supervision/assist for financial management    Equipment Recommendations None recommended by PT  Recommendations for Other Services       Functional Status Assessment Patient has had a recent decline in their functional status and demonstrates the ability to make significant improvements in  function in a reasonable and predictable amount of time.     Precautions / Restrictions Precautions Precautions: Fall Restrictions Weight Bearing Restrictions: No      Mobility  Bed Mobility Overal bed mobility: Needs Assistance Bed Mobility: Supine to Sit, Sit to Supine     Supine to sit: Mod assist, +2 for physical assistance Sit to supine: Max assist, +2 for physical assistance   General bed mobility comments: verbal cues for technique, cues for task initiation. increased time and effort required    Transfers Overall transfer level: Needs assistance   Transfers: Sit to/from Stand            Lateral/Scoot Transfers: Max assist, +2 physical assistance General transfer comment: patient required +2 person assistance for scooting to the left. verbal cues for technique. patient fatigued with minimal activity    Ambulation/Gait                  Stairs            Wheelchair Mobility    Modified Rankin (Stroke Patients Only)       Balance Overall balance assessment: Needs assistance Sitting-balance support: Bilateral upper extremity supported, Feet supported Sitting balance-Leahy Scale: Poor Sitting balance - Comments: faciliation for upright sitting position. increased left lateral lean with fatigue Postural control: Left lateral lean                                   Pertinent Vitals/Pain Pain Assessment Pain Assessment: No/denies pain    Home Living Family/patient expects to be discharged to:: Skilled nursing facility                        Prior Function Prior Level of  Function : Needs assist             Mobility Comments: assist for trasnfers at STR, 05/2022 was MOD I       Hand Dominance        Extremity/Trunk Assessment   Upper Extremity Assessment Upper Extremity Assessment: Generalized weakness    Lower Extremity Assessment Lower Extremity Assessment: Generalized weakness        Communication   Communication: No difficulties  Cognition Arousal/Alertness: Awake/alert Behavior During Therapy: WFL for tasks assessed/performed Overall Cognitive Status: Within Functional Limits for tasks assessed                                 General Comments: slow to respond at times but able to follow single step commands with increased time        General Comments      Exercises Other Exercises Other Exercises: encouraged ankle pumps, quad sets, glute sets   Assessment/Plan    PT Assessment Patient needs continued PT services  PT Problem List Decreased strength;Decreased range of motion;Decreased activity tolerance;Decreased balance;Decreased mobility;Cardiopulmonary status limiting activity       PT Treatment Interventions DME instruction;Gait training;Functional mobility training;Therapeutic activities;Therapeutic exercise;Balance training;Neuromuscular re-education;Patient/family education;Cognitive remediation;Wheelchair mobility training    PT Goals (Current goals can be found in the Care Plan section)  Acute Rehab PT Goals Patient Stated Goal: to go back to rehab PT Goal Formulation: With patient Time For Goal Achievement: 08/07/22 Potential to Achieve Goals: Fair    Frequency Min 2X/week     Co-evaluation PT/OT/SLP Co-Evaluation/Treatment: Yes Reason for Co-Treatment: Complexity of the patient's impairments (multi-system involvement);For patient/therapist safety;To address functional/ADL transfers PT goals addressed during session: Mobility/safety with mobility OT goals addressed during session: ADL's and self-care       AM-PAC PT "6 Clicks" Mobility  Outcome Measure Help needed turning from your back to your side while in a flat bed without using bedrails?: A Lot Help needed moving from lying on your back to sitting on the side of a flat bed without using bedrails?: Total Help needed moving to and from a bed to a chair (including a  wheelchair)?: Total Help needed standing up from a chair using your arms (e.g., wheelchair or bedside chair)?: Total Help needed to walk in hospital room?: Total Help needed climbing 3-5 steps with a railing? : Total 6 Click Score: 7    End of Session Equipment Utilized During Treatment: Oxygen Activity Tolerance: Patient limited by fatigue Patient left: in bed;with call bell/phone within reach;with bed alarm set Nurse Communication: Mobility status PT Visit Diagnosis: Unsteadiness on feet (R26.81);Muscle weakness (generalized) (M62.81)    Time: 7169-6789 PT Time Calculation (min) (ACUTE ONLY): 23 min   Charges:   PT Evaluation $PT Eval Low Complexity: 1 Low PT Treatments $Therapeutic Activity: 8-22 mins        Minna Merritts, PT, MPT   Percell Locus 07/24/2022, 12:49 PM

## 2022-07-24 NOTE — Progress Notes (Signed)
Bladder scan inaccurate d/t morbid obesity. Straight cath revealed 25 cc's. Pt has only had 150 cc's urine output for day shift.   Per night nurse, pt only had 100 cc's urine output for night shift.  Dr. Reesa Chew notified and orders received.

## 2022-07-24 NOTE — Evaluation (Signed)
Occupational Therapy Evaluation Patient Details Name: Madison Mcbride MRN: [id number] DOB: 09-30-1955 Today's Date: 07/24/2022   History of Present Illness 67 y.o. female with medical history significant of thyroid cancer, hypothyroidism hyperlipidemia, Dm2, CKD stage 3, Pafib , CHFpEF, OSA on CPAP, was discharged earlier today to SNF and found to be tachycardic on intake and sent back to ED.   Clinical Impression   Madison Mcbride was seen for OT evaluation this date. Prior to hospital admission, pt was recently at [facility] requiring assist for ADLs and mobility. Pt presents to acute OT demonstrating impaired ADL performance and functional mobility 2/2 decreased activity tolerance and functional strength/balance deficits. Pt currently requires MOD A x2 sup>sit. MAX A don B socks, MIN A don/doff gown seated EOB. MAX A x2 scooting along EOB. Pt would benefit from skilled OT to address noted impairments and functional limitations (see below for any additional details). Upon hospital discharge, recommend STR to maximize pt safety and return to PLOF.    Recommendations for follow up therapy are one component of a multi-disciplinary discharge planning process, led by the attending physician.  Recommendations may be updated based on patient status, additional functional criteria and insurance authorization.   Follow Up Recommendations  Skilled nursing-short term rehab (<3 hours/day)     Assistance Recommended at Discharge Frequent or constant Supervision/Assistance  Patient can return home with the following Two people to help with walking and/or transfers;A lot of help with bathing/dressing/bathroom;Assistance with cooking/housework;Direct supervision/assist for medications management;Direct supervision/assist for financial management;Assist for transportation;Help with stairs or ramp for entrance    Functional Status Assessment  Patient has had a recent decline in their functional status and  demonstrates the ability to make significant improvements in function in a reasonable and predictable amount of time.  Equipment Recommendations  Other (comment)    Recommendations for Other Services       Precautions / Restrictions Precautions Precautions: Fall Restrictions Weight Bearing Restrictions: No      Mobility Bed Mobility Overal bed mobility: Needs Assistance Bed Mobility: Supine to Sit, Sit to Supine     Supine to sit: Mod assist, +2 for physical assistance Sit to supine: Max assist, +2 for physical assistance        Transfers Overall transfer level: Needs assistance   Transfers: Bed to chair/wheelchair/BSC            Lateral/Scoot Transfers: Max assist, +2 physical assistance        Balance Overall balance assessment: Needs assistance Sitting-balance support: No upper extremity supported, Feet supported Sitting balance-Leahy Scale: Poor   Postural control: Left lateral lean                                 ADL either performed or assessed with clinical judgement   ADL Overall ADL's : Needs assistance/impaired                                       General ADL Comments: MAX A don B socks seated EOB. MIN A don/doff gown. MAX A x2 simulated BSC t/f      Pertinent Vitals/Pain Pain Assessment Pain Assessment: No/denies pain     Hand Dominance     Extremity/Trunk Assessment Upper Extremity Assessment Upper Extremity Assessment: Generalized weakness   Lower Extremity Assessment Lower Extremity Assessment: Generalized weakness  Communication Communication Communication: No difficulties   Cognition Arousal/Alertness: Awake/alert Behavior During Therapy: WFL for tasks assessed/performed Overall Cognitive Status: Within Functional Limits for tasks assessed                                 General Comments: Pleasant and cooperative.                Home Living Family/patient expects  to be discharged to:: Skilled nursing facility                                        Prior Functioning/Environment Prior Level of Function : Needs assist             Mobility Comments: assist for trasnfers at [facility], 05/2022 was MOD I          OT Problem List: Decreased strength;Decreased activity tolerance;Impaired balance (sitting and/or standing);Decreased knowledge of use of DME or AE      OT Treatment/Interventions: Self-care/ADL training;Therapeutic exercise;DME and/or AE instruction;Therapeutic activities;Patient/family education    OT Goals(Current goals can be found in the care plan section) Acute Rehab OT Goals Patient Stated Goal: to go to rehab OT Goal Formulation: With patient Time For Goal Achievement: 08/07/22 Potential to Achieve Goals: Good ADL Goals Pt Will Perform Grooming: sitting;with set-up;with supervision Pt Will Perform Lower Body Dressing: sitting/lateral leans;with mod assist Pt Will Transfer to Toilet: with mod assist;with transfer board;bedside commode  OT Frequency: Min 2X/week    Co-evaluation PT/OT/SLP Co-Evaluation/Treatment: Yes Reason for Co-Treatment: Complexity of the patient's impairments (multi-system involvement);For patient/therapist safety;To address functional/ADL transfers PT goals addressed during session: Mobility/safety with mobility OT goals addressed during session: ADL's and self-care      AM-PAC OT "6 Clicks" Daily Activity     Outcome Measure Help from another person eating meals?: None Help from another person taking care of personal grooming?: A Little Help from another person toileting, which includes using toliet, bedpan, or urinal?: A Lot Help from another person bathing (including washing, rinsing, drying)?: A Lot Help from another person to put on and taking off regular upper body clothing?: A Little Help from another person to put on and taking off regular lower body clothing?: A Lot 6 Click  Score: 16   End of Session    Activity Tolerance: Patient tolerated treatment well Patient left: in bed;with call bell/phone within reach;with bed alarm set  OT Visit Diagnosis: Unsteadiness on feet (R26.81);Muscle weakness (generalized) (M62.81)                Time: 2440-1027 OT Time Calculation (min): 23 min Charges:  OT General Charges $OT Visit: 1 Visit OT Evaluation $OT Eval Moderate Complexity: 1 Mod OT Treatments $Self Care/Home Management : 8-22 mins  Dessie Coma, M.S. OTR/L  07/24/22, 12:39 PM  ascom [phone]

## 2022-07-24 NOTE — Progress Notes (Signed)
PROGRESS NOTE    Madison Mcbride  QTM:[id number] DOB: [birth year] DOA: 07/26/2022 PCP: Rusty Aus, MD   Brief Narrative:  67 y.o. female with medical history significant of thyroid cancer, hypothyroidism hyperlipidemia, Dm2, CKD stage 3, Pafib , CHFpEF, OSA on CPAP, was discharged earlier today to SNF and found to be tachycardic on intake and sent back to ED.  Pt husband states that she appeared diaphoretic and slightly dyspneic, and somnolent.  Upon admission initially placed on BiPAP and started on vancomycin, cefepime and Flagyl.  Patient remained hypotensive despite of IV fluids therefore required Levophed and was monitored in the ICU.  Cardiology team was consulted.  There were signs of volume overload and increasing work of breathing   Assessment & Plan:  Principal Problem:   Paroxysmal atrial fibrillation with RVR (HCC) Active Problems:   Acute on chronic respiratory failure with hypoxia and hypercapnia (HCC)   HLD (hyperlipidemia)   OSA (obstructive sleep apnea)   Type 2 diabetes mellitus with diabetic neuropathy, without long-term current use of insulin (HCC)   Elevated troponin   Hypothyroidism   Acute respiratory failure with hypoxia (HCC)   Thoracic aortic aneurysm (HCC)   Hyperkalemia   Hypotension   Leukocytosis   Normocytic anemia   Acute pulmonary edema (HCC)   HAP (hospital-acquired pneumonia)   HFrEF (heart failure with reduced ejection fraction) (HCC)      Acute on chronic hypoxic and Hypercapnic Respiratory Failure- 2/2 OHS and hypervolemia Possible HAP-  Suspect more so secondary to fluid overload but also has right lower lobe infiltration.  Procalcitonin 0.5, initial BNP 2302.  Received Zosyn from 1/6 - 1/9, currently on IV Unasyn (total 7 day course).  On bronchodilators, scheduled and as needed, incentive spirometer and flutter valve.   Afib with RVR Heart rate remains elevated.  Currently on amiodarone twice daily, uptitrate as necessary On  metoprolol 50 mg 3 times daily.  Cardizem 30 mg every 6 hours Eliquis.  AKI with increased diuresis.  Baseline creatinine 1.2, this morning is 2.26.  Stop Lasix. Nephro consulted by previous provider   HFrEF ejection fraction 50% Echocardiogram shows EF of 50%.  Seen by cardiology.  Stopping Lasix due to significant rise in creatinine   Type II DM- Hemoglobin A1c 5.3.  Due to signs of hypoglycemia, low-salt diet   Psych-  Continue home Requip   Hypothyroidism  Continue levothyroxine  Chronic venous stasis dermatitis of both lower extremities Lower extremity edema that is nonpitting in nature with hyperpigmentation consistent with chronic venous stasis.  Patient states symptoms are improving.  No evidence of acute infection.  Patient would like to hold off on Unna boots at this time. Elevated legs whenever possible  OSA (obstructive sleep apnea) - BiPAP at night or whenever she is sleeping   PT/OT-pending   DVT prophylaxis: Eliquis Code Status: Full Family Communication:    Status is: Inpatient On going eval for CHF, fluid overload and AKI    Subjective: Feels a little better.  In the morning she was very drowsy but easily arousable.  Woke up and was starting to eat breakfast when I walked in the room.   Examination:  General exam: Appears calm and comfortable, morbidly obese Respiratory system: Some bibasilar crackles Cardiovascular system: Irregularly irregular. Gastrointestinal system: Abdomen is nondistended, soft and nontender. No organomegaly or masses felt. Normal bowel sounds heard.  2+ bilateral lower extremity pitting edema Central nervous system: Alert and oriented. No focal neurological deficits. Extremities: Symmetric 4 x 5  power. Skin: No rashes, lesions or ulcers Psychiatry: Judgement and insight appear normal. Mood & affect appropriate.     Objective: Vitals:   07/24/22 0310 07/24/22 0621 07/24/22 0729 07/24/22 0758  BP:    107/65  Pulse:    (!)  55  Resp:    20  Temp:    97.6 F (36.4 C)  TempSrc:    Oral  SpO2: 98% 94% 94% 90%  Weight:      Height:        Intake/Output Summary (Last 24 hours) at 07/24/2022 0800 Last data filed at 07/24/2022 0538 Gross per 24 hour  Intake 1257 ml  Output 105 ml  Net 1152 ml   Filed Weights   07/21/22 0500 07/22/22 0321 07/23/22 0500  Weight: (!) 136.6 kg (!) 140 kg 135.8 kg     Data Reviewed:   CBC: Recent Labs  Lab 07/20/22 0246 07/21/22 0520 07/22/22 0541 07/23/22 0324 07/24/22 0551  WBC 9.7 9.8 10.6* 11.6* 11.0*  HGB 9.4* 10.3* 10.4* 10.7* 10.5*  HCT 32.7* 36.8 37.0 37.5 37.1  MCV 89.8 92.5 91.6 90.6 91.4  PLT 319 339 334 334 938   Basic Metabolic Panel: Recent Labs  Lab 07/18/22 0652 07/18/22 2044 07/19/22 0541 07/20/22 0246 07/21/22 0520 07/22/22 0541 07/23/22 0324 07/24/22 0551  NA 139   < > 140 140 140 139 138 136  K 4.6   < > 4.2 3.8 3.9 4.4 4.2 4.1  CL 96*   < > 98 95* 95* 96* 94* 94*  CO2 31   < > 28 33* 35* 31 32 30  GLUCOSE 96   < > 67* 89 109* 86 91 89  BUN 66*   < > 62* 50* 41* 40* 46* 58*  CREATININE 1.67*   < > 1.59* 1.41* 1.22* 1.26* 1.67* 2.26*  CALCIUM 8.2*   < > 8.0* 8.0* 8.6* 8.5* 8.3* 8.2*  MG 2.6*  --  2.4  --   --   --   --   --   PHOS 7.0*  --   --   --   --   --   --   --    < > = values in this interval not displayed.   GFR: Estimated Creatinine Clearance: 33.3 mL/min (A) (by C-G formula based on SCr of 2.26 mg/dL (H)). Liver Function Tests: Recent Labs  Lab 07/21/22 0520 07/22/22 0541 07/23/22 0324  AST 118* 80* 179*  ALT 132* 98* 134*  ALKPHOS 127* 132* 137*  BILITOT 1.4* 1.2 1.8*  PROT 7.5 6.9 6.7  ALBUMIN 3.4* 3.1* 3.0*   No results for input(s): "LIPASE", "AMYLASE" in the last 168 hours. No results for input(s): "AMMONIA" in the last 168 hours. Coagulation Profile: Recent Labs  Lab 07/18/22 1456  INR 3.6*   Cardiac Enzymes: No results for input(s): "CKTOTAL", "CKMB", "CKMBINDEX", "TROPONINI" in the last 168  hours. BNP (last 3 results) No results for input(s): "PROBNP" in the last 8760 hours. HbA1C: No results for input(s): "HGBA1C" in the last 72 hours. CBG: Recent Labs  Lab 07/23/22 2142 07/24/22 0008 07/24/22 0257 07/24/22 0332 07/24/22 0538  GLUCAP 87 71 62* 71 94   Lipid Profile: No results for input(s): "CHOL", "HDL", "LDLCALC", "TRIG", "CHOLHDL", "LDLDIRECT" in the last 72 hours. Thyroid Function Tests: No results for input(s): "TSH", "T4TOTAL", "FREET4", "T3FREE", "THYROIDAB" in the last 72 hours. Anemia Panel: No results for input(s): "VITAMINB12", "FOLATE", "FERRITIN", "TIBC", "IRON", "RETICCTPCT" in the last 72 hours. Sepsis  Labs: Recent Labs  Lab 07/18/2022 1523 07/23/2022 2301 07/18/22 0041  PROCALCITON 0.54  --   --   LATICACIDVEN  --  3.7* 2.9*    Recent Results (from the past 240 hour(s))  Blood culture (routine x 2)     Status: None   Collection Time: 08/03/2022 11:01 PM   Specimen: BLOOD  Result Value Ref Range Status   Specimen Description BLOOD BLOOD RIGHT WRIST  Final   Special Requests   Final    BOTTLES DRAWN AEROBIC AND ANAEROBIC Blood Culture results may not be optimal due to an inadequate volume of blood received in culture bottles   Culture   Final    NO GROWTH 5 DAYS Performed at [facility], [address] [place], [place] [address]    Report Status 07/22/2022 FINAL  Final  Blood culture (routine x 2)     Status: None   Collection Time: 08/07/2022 11:01 PM   Specimen: BLOOD  Result Value Ref Range Status   Specimen Description BLOOD BLOOD LEFT HAND  Final   Special Requests IN PEDIATRIC BOTTLE Blood Culture adequate volume  Final   Culture   Final    NO GROWTH 5 DAYS Performed at [facility], [address] [place], [place] [address]    Report Status 07/22/2022 FINAL  Final  MRSA Next Gen by PCR, Nasal     Status: None   Collection Time: 07/18/22 10:11 AM   Specimen: Nasal Mucosa; Nasal Swab  Result Value Ref Range  Status   MRSA by PCR Next Gen NOT DETECTED NOT DETECTED Final    Comment: (NOTE) The GeneXpert MRSA Assay (FDA approved for NASAL specimens only), is one component of a comprehensive MRSA colonization surveillance program. It is not intended to diagnose MRSA infection nor to guide or monitor treatment for MRSA infections. Test performance is not FDA approved in patients less than 41 years old. Performed at [facility], [place], [place] [address]   Respiratory (~20 pathogens) panel by PCR     Status: None   Collection Time: 07/18/22 11:07 AM   Specimen: Nasopharyngeal Swab; Respiratory  Result Value Ref Range Status   Adenovirus NOT DETECTED NOT DETECTED Final   Coronavirus 229E NOT DETECTED NOT DETECTED Final    Comment: (NOTE) The Coronavirus on the Respiratory Panel, DOES NOT test for the novel  Coronavirus (2019 nCoV)    Coronavirus HKU1 NOT DETECTED NOT DETECTED Final   Coronavirus NL63 NOT DETECTED NOT DETECTED Final   Coronavirus OC43 NOT DETECTED NOT DETECTED Final   Metapneumovirus NOT DETECTED NOT DETECTED Final   Rhinovirus / Enterovirus NOT DETECTED NOT DETECTED Final   Influenza A NOT DETECTED NOT DETECTED Final   Influenza B NOT DETECTED NOT DETECTED Final   Parainfluenza Virus 1 NOT DETECTED NOT DETECTED Final   Parainfluenza Virus 2 NOT DETECTED NOT DETECTED Final   Parainfluenza Virus 3 NOT DETECTED NOT DETECTED Final   Parainfluenza Virus 4 NOT DETECTED NOT DETECTED Final   Respiratory Syncytial Virus NOT DETECTED NOT DETECTED Final   Bordetella pertussis NOT DETECTED NOT DETECTED Final   Bordetella Parapertussis NOT DETECTED NOT DETECTED Final   Chlamydophila pneumoniae NOT DETECTED NOT DETECTED Final   Mycoplasma pneumoniae NOT DETECTED NOT DETECTED Final    Comment: Performed at [facility], [place]. [address], [place], [place] [address]         Radiology Studies: DG Chest Port 1 View  Result Date: 07/23/2022 CLINICAL  DATA:  [id number] SOB (shortness of breath) [id number] EXAM: PORTABLE CHEST 1 VIEW COMPARISON:  July 17, 2022 FINDINGS: The cardiomediastinal silhouette is unchanged and enlarged in contour. Small bilateral pleural effusions, similar comparison to prior. No pneumothorax. Perihilar vascular congestion, peribronchial cuffing, vascular indistinctness and bibasilar reticulonodular opacities favored to reflect pulmonary edema. LEFT retrocardiac homogeneous opacity, likely atelectasis. Advanced degenerative changes of the RIGHT shoulder. IMPRESSION: Constellation of findings are favored to reflect increased pulmonary edema with small bilateral pleural effusions and basilar atelectasis. Electronically Signed   By: Valentino Saxon M.D.   On: 07/23/2022 09:13        Scheduled Meds:  apixaban  5 mg Oral BID   atorvastatin  20 mg Oral Daily   budesonide (PULMICORT) nebulizer solution  0.25 mg Nebulization BID   buPROPion  150 mg Oral Daily   diltiazem  30 mg Oral Q6H   furosemide  40 mg Intravenous Q6H   ipratropium-albuterol  3 mL Nebulization TID   levothyroxine  250 mcg Oral Q0600   metoprolol tartrate  50 mg Oral TID   pantoprazole  40 mg Oral Daily   rOPINIRole  3 mg Oral QHS   Continuous Infusions:   LOS: 7 days   Time spent= 35 mins    Carrson Lightcap Arsenio Loader, MD [facility]  If 7PM-7AM, please contact night-coverage  07/24/2022, 8:00 AM

## 2022-07-24 NOTE — Progress Notes (Signed)
[facility] Cardiology    SUBJECTIVE: Patient states she feels better no shortness of breath no weakness no fatigue denies palpitations or tachycardia   Vitals:   07/24/22 0935 07/24/22 1234 07/24/22 1330 07/24/22 1609  BP:  110/77  116/85  Pulse:  98 84 88  Resp:  20 (!) 22 20  Temp:  97.6 F (36.4 C)  (!) 97.4 F (36.3 C)  TempSrc:    Oral  SpO2: (!) 88% 93% 94% 100%  Weight:      Height:         Intake/Output Summary (Last 24 hours) at 07/24/2022 1645 Last data filed at 07/24/2022 7858 Gross per 24 hour  Intake 777 ml  Output 105 ml  Net 672 ml      PHYSICAL EXAM  General: Well developed, well nourished, in no acute distress HEENT:  Normocephalic and atramatic Neck:  No JVD.  Lungs: Clear bilaterally to auscultation and percussion. Heart:  irregular irregular. Normal S1 and S2 without gallops or murmurs.  Abdomen: Bowel sounds are positive, abdomen soft and non-tender  Msk:  Back normal, normal gait. Normal strength and tone for age. Extremities: No clubbing, cyanosis or edema.   Neuro: Alert and oriented X 3. Psych:  Good affect, responds appropriately   LABS: Basic Metabolic Panel: Recent Labs    07/23/22 0324 07/24/22 0551  NA 138 136  K 4.2 4.1  CL 94* 94*  CO2 32 30  GLUCOSE 91 89  BUN 46* 58*  CREATININE 1.67* 2.26*  CALCIUM 8.3* 8.2*   Liver Function Tests: Recent Labs    07/22/22 0541 07/23/22 0324  AST 80* 179*  ALT 98* 134*  ALKPHOS 132* 137*  BILITOT 1.2 1.8*  PROT 6.9 6.7  ALBUMIN 3.1* 3.0*   No results for input(s): "LIPASE", "AMYLASE" in the last 72 hours. CBC: Recent Labs    07/23/22 0324 07/24/22 0551  WBC 11.6* 11.0*  HGB 10.7* 10.5*  HCT 37.5 37.1  MCV 90.6 91.4  PLT 334 359   Cardiac Enzymes: No results for input(s): "CKTOTAL", "CKMB", "CKMBINDEX", "TROPONINI" in the last 72 hours. BNP: Invalid input(s): "POCBNP" D-Dimer: No results for input(s): "DDIMER" in the last 72 hours. Hemoglobin A1C: No results for  input(s): "HGBA1C" in the last 72 hours. Fasting Lipid Panel: No results for input(s): "CHOL", "HDL", "LDLCALC", "TRIG", "CHOLHDL", "LDLDIRECT" in the last 72 hours. Thyroid Function Tests: No results for input(s): "TSH", "T4TOTAL", "T3FREE", "THYROIDAB" in the last 72 hours.  Invalid input(s): "FREET3" Anemia Panel: No results for input(s): "VITAMINB12", "FOLATE", "FERRITIN", "TIBC", "IRON", "RETICCTPCT" in the last 72 hours.  DG Chest Port 1 View  Result Date: 07/23/2022 CLINICAL DATA:  [id number] SOB (shortness of breath) [id number] EXAM: PORTABLE CHEST 1 VIEW COMPARISON:  July 17, 2022 FINDINGS: The cardiomediastinal silhouette is unchanged and enlarged in contour. Small bilateral pleural effusions, similar comparison to prior. No pneumothorax. Perihilar vascular congestion, peribronchial cuffing, vascular indistinctness and bibasilar reticulonodular opacities favored to reflect pulmonary edema. LEFT retrocardiac homogeneous opacity, likely atelectasis. Advanced degenerative changes of the RIGHT shoulder. IMPRESSION: Constellation of findings are favored to reflect increased pulmonary edema with small bilateral pleural effusions and basilar atelectasis. Electronically Signed   By: Valentino Saxon M.D.   On: 07/23/2022 09:13     Echo mild reduced left ventricular function EF of 45%  TELEMETRY: Atrial fibrillation rate 85 nonspecific ST-T changes:  ASSESSMENT AND PLAN:  Principal Problem:   Paroxysmal atrial fibrillation with RVR (HCC) Active Problems:   Type  2 diabetes mellitus with diabetic neuropathy, without long-term current use of insulin (HCC)   Hypothyroidism   HLD (hyperlipidemia)   Elevated troponin   OSA (obstructive sleep apnea)   Acute respiratory failure with hypoxia (HCC)   Acute on chronic respiratory failure with hypoxia and hypercapnia (HCC)   Thoracic aortic aneurysm (HCC)   Hyperkalemia   Hypotension   Leukocytosis   Normocytic anemia   Acute pulmonary edema  (HCC)   HAP (hospital-acquired pneumonia)   HFrEF (heart failure with reduced ejection fraction) ([place])    Plan Atrial fibrillation improved rate controlled continue metoprolol Cardizem continue anticoagulation with Eliquis Respiratory failure continue supplemental oxygen and inhalers  Obstructive sleep and recommend sleep study CPAP with Increased activities agree with physical therapy occupational therapy Continue antibiotic therapy for pneumonia respiratory failure Obesity recommend weight loss exercise portion control Hypertension improved on lisinopril Tachycardia improved heart rate 80's    Yolonda Kida, MD 07/24/2022 4:45 PM

## 2022-07-24 NOTE — Progress Notes (Signed)
Hypoglycemic Event  CBG: 62  Treatment: 4 oz juice/soda  Symptoms: Sweaty  Follow-up CBG: Time 0330 CBG Result:71  Possible Reasons for Event: Inadequate meal intake  Comments/MD notified:0335 NP Randol Kern made aware.    Bentlie Catanzaro Elmon Kirschner

## 2022-07-25 ENCOUNTER — Inpatient Hospital Stay: Payer: Medicare Other

## 2022-07-25 DIAGNOSIS — I959 Hypotension, unspecified: Secondary | ICD-10-CM

## 2022-07-25 DIAGNOSIS — D72825 Bandemia: Secondary | ICD-10-CM

## 2022-07-25 DIAGNOSIS — E039 Hypothyroidism, unspecified: Secondary | ICD-10-CM

## 2022-07-25 DIAGNOSIS — D649 Anemia, unspecified: Secondary | ICD-10-CM

## 2022-07-25 DIAGNOSIS — J9601 Acute respiratory failure with hypoxia: Secondary | ICD-10-CM

## 2022-07-25 DIAGNOSIS — R7989 Other specified abnormal findings of blood chemistry: Secondary | ICD-10-CM

## 2022-07-25 DIAGNOSIS — I480 Paroxysmal atrial fibrillation: Secondary | ICD-10-CM | POA: Diagnosis not present

## 2022-07-25 DIAGNOSIS — E1140 Type 2 diabetes mellitus with diabetic neuropathy, unspecified: Secondary | ICD-10-CM | POA: Diagnosis not present

## 2022-07-25 DIAGNOSIS — G4733 Obstructive sleep apnea (adult) (pediatric): Secondary | ICD-10-CM

## 2022-07-25 DIAGNOSIS — J9621 Acute and chronic respiratory failure with hypoxia: Secondary | ICD-10-CM | POA: Diagnosis not present

## 2022-07-25 LAB — BASIC METABOLIC PANEL
Anion gap: 15 (ref 5–15)
BUN: 64 mg/dL — ABNORMAL HIGH (ref 8–23)
CO2: 29 mmol/L (ref 22–32)
Calcium: 8.2 mg/dL — ABNORMAL LOW (ref 8.9–10.3)
Chloride: 92 mmol/L — ABNORMAL LOW (ref 98–111)
Creatinine, Ser: 2.63 mg/dL — ABNORMAL HIGH (ref 0.44–1.00)
GFR, Estimated: 19 mL/min — ABNORMAL LOW (ref >60)
Glucose, Bld: 72 mg/dL (ref 70–99)
Potassium: 4.5 mmol/L (ref 3.5–5.1)
Sodium: 136 mmol/L (ref 135–145)

## 2022-07-25 LAB — CBC
HCT: 37.3 % (ref 36.0–46.0)
Hemoglobin: 10.7 g/dL — ABNORMAL LOW (ref 12.0–15.0)
MCH: 25.9 pg — ABNORMAL LOW (ref 26.0–34.0)
MCHC: 28.7 g/dL — ABNORMAL LOW (ref 30.0–36.0)
MCV: 90.3 fL (ref 80.0–100.0)
Platelets: 373 10*3/uL (ref 150–400)
RBC: 4.13 MIL/uL (ref 3.87–5.11)
RDW: 24.3 % — ABNORMAL HIGH (ref 11.5–15.5)
WBC: 11.3 10*3/uL — ABNORMAL HIGH (ref 4.0–10.5)
nRBC: 1 % — ABNORMAL HIGH (ref 0.0–0.2)

## 2022-07-25 LAB — MAGNESIUM: Magnesium: 2.5 mg/dL — ABNORMAL HIGH (ref 1.7–2.4)

## 2022-07-25 MED ORDER — AMOXICILLIN-POT CLAVULANATE 500-125 MG PO TABS
1.0000 | ORAL_TABLET | Freq: Two times a day (BID) | ORAL | Status: AC
  Administered 2022-07-25 – 2022-07-26 (×4): 1 via ORAL
  Filled 2022-07-25 (×4): qty 1

## 2022-07-25 NOTE — Progress Notes (Signed)
PROGRESS NOTE  Madison Mcbride DPO:[id number] DOB: September 13, 1955   PCP: Rusty Aus, MD  Patient is from: SNF  DOA: 07/16/2022 LOS: 8  Chief complaints Chief Complaint  Patient presents with   Shortness of Breath     Brief Narrative / Interim history: 67 year old F with PMH of A-fib on Eliquis, diastolic CHF, DM-2, HTN, HLD, hypothyroidism, thyroid cancer, anxiety, depression, morbid obesity, chronic back pain, OSA on CPAP and CKD-3A who was sent back by SNF on the day of discharge due to tachycardia on intake.  Pt husband states that she appeared diaphoretic and slightly dyspneic, and somnolent.  Upon admission initially placed on BiPAP for acute on chronic hypoxic and hypercapnic respiratory failure and started on vancomycin, cefepime and Flagyl for possible hospital-acquired pneumonia.  Patient remained hypotensive despite of IV fluids therefore required Levophed and was monitored in the ICU.  Cardiology team was consulted.  There were signs of volume overload and increasing work of breathing.  She was diuresed with IV Lasix and as needed metolazone.  Unfortunately, she developed AKI.  Nephrology consulted.  Subjective: Seen and examined earlier this morning.  No major events overnight of this morning.  No complaints.  She denies chest pain, dyspnea, dizziness, palpitation, GI or UTI symptoms.  Objective: Vitals:   07/25/22 0846 07/25/22 1153 07/25/22 1338 07/25/22 1643  BP:  110/74  109/81  Pulse: (!) 107 95 (!) 107 99  Resp: '20 20 18   '$ Temp:      TempSrc:      SpO2: 97% 96% 96% 95%  Weight:      Height:        Examination:  GENERAL: No apparent distress.  Nontoxic. HEENT: MMM.  Vision and hearing grossly intact.  NECK: Supple.  Difficult to assess JVD due to body habitus. RESP:  No IWOB.  Fair aeration bilaterally. CVS:  RRR. Heart sounds normal.  ABD/GI/GU: BS+. Abd soft, NTND.  MSK/EXT:  Moves extremities. No apparent deformity. No edema.  SKIN: no apparent skin  lesion or wound NEURO: Awake, alert and oriented appropriately.  No apparent focal neuro deficit.  BLE weakness. PSYCH: Calm. Normal affect.     Microbiology summarized: RVP and MRSA PCR screen nonreactive. Blood cultures NGTD  Assessment and plan: Principal Problem:   Paroxysmal atrial fibrillation with RVR (HCC) Active Problems:   Acute on chronic respiratory failure with hypoxia and hypercapnia (HCC)   HLD (hyperlipidemia)   OSA (obstructive sleep apnea)   Type 2 diabetes mellitus with diabetic neuropathy, without long-term current use of insulin (HCC)   Elevated troponin   Hypothyroidism   Acute respiratory failure with hypoxia (HCC)   Thoracic aortic aneurysm (HCC)   Hyperkalemia   Hypotension   Leukocytosis   Normocytic anemia   Acute pulmonary edema (HCC)   HAP (hospital-acquired pneumonia)   HFrEF (heart failure with reduced ejection fraction) (HCC)  Acute respiratory failure with hypoxia and hypercapnia-due to OSA/OHS, and possible hospital-acquired pneumonia and and fluid overload.  Pro-Cal 0.5.  Initial BNP 2300.  -Zosyn from 1/6 - 1/9> IV Unasyn> Augmentin -On bronchodilators, scheduled and as needed, incentive spirometer and flutter valve. -Minimize oxygen use   Afib with RVR: HR in the range of 90s to 100s. -Cardiology following -Continue metoprolol 50 mg 3 times daily and Cardizem CD1 20 mg daily -IV metoprolol 5 mg every 6 hours as needed -Continue anticoagulation   AKI on CKD-3B: B/l  Cr seems to be about 1.2.  Renal US suboptimal due to body  habitus and bowel gas Recent Labs    07/19/2022 1523 07/18/22 0652 07/18/22 2044 07/19/22 0541 07/20/22 0246 07/21/22 0520 07/22/22 0541 07/23/22 0324 07/24/22 0551 07/25/22 0549  BUN 63* 66* 67* 62* 50* 41* 40* 46* 58* 64*  CREATININE 1.61* 1.67* 1.69* 1.59* 1.41* 1.22* 1.26* 1.67* 2.26* 2.63*  -Hold diuretics. -Nephrology consulted  HFmrEF: TTE in 02/2022 with LVEF of 45 to 50% and normal DD.  Appears  euvolemic on exam but difficult due to body habitus. -Update echocardiogram -Defer diuretics to cardiology and nephrology  Controlled DM-2 with CKD-3B and hypoglycemia: A1c 5.3% in 06/2022.   Recent Labs  Lab 07/23/22 2142 07/24/22 0008 07/24/22 0257 07/24/22 0332 07/24/22 0538  GLUCAP 87 71 62* 71 94  -Monitor CBG  Restless leg syndrome -Continue home Requip   Hypothyroidism  -Continue levothyroxine   Chronic venous stasis dermatitis of both lower extremities  Anxiety/depression/chronic pain: Stable -Continue home meds.   OSA/OHS -BiPAP at night and when she sleeps   Physical deconditioning: Patient is from SNF. -PT/OT  Morbid obesity: Could be contributing to physical deconditioning and respiratory failure. Body mass index is 49.82 kg/m.           DVT prophylaxis:   apixaban (ELIQUIS) tablet 5 mg  Code Status: Full code Family Communication: None at bedside Level of care: Telemetry Cardiac Status is: Inpatient Remains inpatient appropriate because: AKI   Final disposition: SNF Consultants:  Pulmonology Cardiology Nephrology  55 minutes with more than 50% spent in reviewing records, counseling patient/family and coordinating care.   Sch Meds:  Scheduled Meds:  amoxicillin-clavulanate  1 tablet Oral BID   apixaban  5 mg Oral BID   atorvastatin  20 mg Oral Daily   budesonide (PULMICORT) nebulizer solution  0.25 mg Nebulization BID   buPROPion  150 mg Oral Daily   diltiazem  120 mg Oral Daily   ipratropium-albuterol  3 mL Nebulization TID   levothyroxine  250 mcg Oral Q0600   metoprolol tartrate  50 mg Oral TID   pantoprazole  40 mg Oral Daily   rOPINIRole  3 mg Oral QHS   Continuous Infusions: PRN Meds:.acetaminophen, guaiFENesin, hydrALAZINE, levalbuterol, liver oil-zinc oxide, metoprolol tartrate, ondansetron (ZOFRAN) IV, oxyCODONE, polyethylene glycol, senna-docusate  Antimicrobials: Anti-infectives (From admission, onward)    Start      Dose/Rate Route Frequency Ordered Stop   07/25/22 1200  amoxicillin-clavulanate (AUGMENTIN) 500-125 MG per tablet 1 tablet        1 tablet Oral 2 times daily 07/25/22 1102 07/27/22 0959   07/24/22 1400  Ampicillin-Sulbactam (UNASYN) 3 g in sodium chloride 0.9 % 100 mL IVPB  Status:  Discontinued        3 g 200 mL/hr over 30 Minutes Intravenous Every 6 hours 07/24/22 1228 07/24/22 1231   07/24/22 1330  amoxicillin-clavulanate (AUGMENTIN) 875-125 MG per tablet 1 tablet  Status:  Discontinued        1 tablet Oral Every 12 hours 07/24/22 1231 07/25/22 1102   07/21/22 1500  Ampicillin-Sulbactam (UNASYN) 3 g in sodium chloride 0.9 % 100 mL IVPB        3 g 200 mL/hr over 30 Minutes Intravenous Every 6 hours 07/21/22 1357 07/22/22 2216   07/18/22 1900  azithromycin (ZITHROMAX) 500 mg in sodium chloride 0.9 % 250 mL IVPB  Status:  Discontinued        500 mg 250 mL/hr over 60 Minutes Intravenous Every 24 hours 07/18/22 0205 07/19/22 1204   07/18/22 1000  piperacillin-tazobactam (ZOSYN) IVPB  3.375 g  Status:  Discontinued        3.375 g 12.5 mL/hr over 240 Minutes Intravenous Every 8 hours 07/18/22 0842 07/21/22 1357   07/18/22 0800  cefTRIAXone (ROCEPHIN) 1 g in sodium chloride 0.9 % 100 mL IVPB  Status:  Discontinued        1 g 200 mL/hr over 30 Minutes Intravenous Every 24 hours 07/18/22 0205 07/18/22 0829   08/05/2022 1915  vancomycin (VANCOREADY) IVPB 2000 mg/400 mL        2,000 mg 200 mL/hr over 120 Minutes Intravenous  Once 07/25/2022 1914 07/16/2022 2250   07/26/2022 1900  ceFEPIme (MAXIPIME) 2 g in sodium chloride 0.9 % 100 mL IVPB        2 g 200 mL/hr over 30 Minutes Intravenous  Once 08/04/2022 1853 07/18/22 1900   08/02/2022 1900  azithromycin (ZITHROMAX) 500 mg in sodium chloride 0.9 % 250 mL IVPB        500 mg 250 mL/hr over 60 Minutes Intravenous  Once 07/29/2022 1853 07/18/22 1900        I have personally reviewed the following labs and images: CBC: Recent Labs  Lab 07/21/22 0520  07/22/22 0541 07/23/22 0324 07/24/22 0551 07/25/22 0549  WBC 9.8 10.6* 11.6* 11.0* 11.3*  HGB 10.3* 10.4* 10.7* 10.5* 10.7*  HCT 36.8 37.0 37.5 37.1 37.3  MCV 92.5 91.6 90.6 91.4 90.3  PLT 339 334 334 359 373   BMP &GFR Recent Labs  Lab 07/19/22 0541 07/20/22 0246 07/21/22 0520 07/22/22 0541 07/23/22 0324 07/24/22 0551 07/25/22 0549  NA 140   < > 140 139 138 136 136  K 4.2   < > 3.9 4.4 4.2 4.1 4.5  CL 98   < > 95* 96* 94* 94* 92*  CO2 28   < > 35* 31 32 30 29  GLUCOSE 67*   < > 109* 86 91 89 72  BUN 62*   < > 41* 40* 46* 58* 64*  CREATININE 1.59*   < > 1.22* 1.26* 1.67* 2.26* 2.63*  CALCIUM 8.0*   < > 8.6* 8.5* 8.3* 8.2* 8.2*  MG 2.4  --   --   --   --   --  2.5*   < > = values in this interval not displayed.   Estimated Creatinine Clearance: 28.6 mL/min (A) (by C-G formula based on SCr of 2.63 mg/dL (H)). Liver & Pancreas: Recent Labs  Lab 07/21/22 0520 07/22/22 0541 07/23/22 0324  AST 118* 80* 179*  ALT 132* 98* 134*  ALKPHOS 127* 132* 137*  BILITOT 1.4* 1.2 1.8*  PROT 7.5 6.9 6.7  ALBUMIN 3.4* 3.1* 3.0*   No results for input(s): "LIPASE", "AMYLASE" in the last 168 hours. No results for input(s): "AMMONIA" in the last 168 hours. Diabetic: No results for input(s): "HGBA1C" in the last 72 hours. Recent Labs  Lab 07/23/22 2142 07/24/22 0008 07/24/22 0257 07/24/22 0332 07/24/22 0538  GLUCAP 87 71 62* 71 94   Cardiac Enzymes: No results for input(s): "CKTOTAL", "CKMB", "CKMBINDEX", "TROPONINI" in the last 168 hours. No results for input(s): "PROBNP" in the last 8760 hours. Coagulation Profile: No results for input(s): "INR", "PROTIME" in the last 168 hours. Thyroid Function Tests: No results for input(s): "TSH", "T4TOTAL", "FREET4", "T3FREE", "THYROIDAB" in the last 72 hours. Lipid Profile: No results for input(s): "CHOL", "HDL", "LDLCALC", "TRIG", "CHOLHDL", "LDLDIRECT" in the last 72 hours. Anemia Panel: No results for input(s): "VITAMINB12",  "FOLATE", "FERRITIN", "TIBC", "IRON", "RETICCTPCT" in the last  72 hours. Urine analysis:    Component Value Date/Time   COLORURINE AMBER (A) 07/08/2022 2201   APPEARANCEUR CLOUDY (A) 07/08/2022 2201   APPEARANCEUR CLOUDY 01/15/2014 0209   LABSPEC 1.015 07/08/2022 2201   LABSPEC 1.014 01/15/2014 0209   PHURINE 5.0 07/08/2022 2201   GLUCOSEU NEGATIVE 07/08/2022 2201   GLUCOSEU NEGATIVE 01/15/2014 0209   HGBUR MODERATE (A) 07/08/2022 2201   BILIRUBINUR NEGATIVE 07/08/2022 2201   BILIRUBINUR NEGATIVE 01/15/2014 0209   KETONESUR NEGATIVE 07/08/2022 2201   PROTEINUR NEGATIVE 07/08/2022 2201   NITRITE NEGATIVE 07/08/2022 2201   LEUKOCYTESUR LARGE (A) 07/08/2022 2201   LEUKOCYTESUR 2+ 01/15/2014 0209   Sepsis Labs: Invalid input(s): "PROCALCITONIN", "LACTICIDVEN"  Microbiology: Recent Results (from the past 240 hour(s))  Blood culture (routine x 2)     Status: None   Collection Time: 07/24/2022 11:01 PM   Specimen: BLOOD  Result Value Ref Range Status   Specimen Description BLOOD BLOOD RIGHT WRIST  Final   Special Requests   Final    BOTTLES DRAWN AEROBIC AND ANAEROBIC Blood Culture results may not be optimal due to an inadequate volume of blood received in culture bottles   Culture   Final    NO GROWTH 5 DAYS Performed at [facility], [address] [place], [place] [address]    Report Status 07/22/2022 FINAL  Final  Blood culture (routine x 2)     Status: None   Collection Time: 07/21/2022 11:01 PM   Specimen: BLOOD  Result Value Ref Range Status   Specimen Description BLOOD BLOOD LEFT HAND  Final   Special Requests IN PEDIATRIC BOTTLE Blood Culture adequate volume  Final   Culture   Final    NO GROWTH 5 DAYS Performed at [facility], [address], [place], [place] [address]    Report Status 07/22/2022 FINAL  Final  MRSA Next Gen by PCR, Nasal     Status: None   Collection Time: 07/18/22 10:11 AM   Specimen: Nasal Mucosa; Nasal Swab  Result Value Ref  Range Status   MRSA by PCR Next Gen NOT DETECTED NOT DETECTED Final    Comment: (NOTE) The GeneXpert MRSA Assay (FDA approved for NASAL specimens only), is one component of a comprehensive MRSA colonization surveillance program. It is not intended to diagnose MRSA infection nor to guide or monitor treatment for MRSA infections. Test performance is not FDA approved in patients less than 13 years old. Performed at [facility], [place], [place] [address]   Respiratory (~20 pathogens) panel by PCR     Status: None   Collection Time: 07/18/22 11:07 AM   Specimen: Nasopharyngeal Swab; Respiratory  Result Value Ref Range Status   Adenovirus NOT DETECTED NOT DETECTED Final   Coronavirus 229E NOT DETECTED NOT DETECTED Final    Comment: (NOTE) The Coronavirus on the Respiratory Panel, DOES NOT test for the novel  Coronavirus (2019 nCoV)    Coronavirus HKU1 NOT DETECTED NOT DETECTED Final   Coronavirus NL63 NOT DETECTED NOT DETECTED Final   Coronavirus OC43 NOT DETECTED NOT DETECTED Final   Metapneumovirus NOT DETECTED NOT DETECTED Final   Rhinovirus / Enterovirus NOT DETECTED NOT DETECTED Final   Influenza A NOT DETECTED NOT DETECTED Final   Influenza B NOT DETECTED NOT DETECTED Final   Parainfluenza Virus 1 NOT DETECTED NOT DETECTED Final   Parainfluenza Virus 2 NOT DETECTED NOT DETECTED Final   Parainfluenza Virus 3 NOT DETECTED NOT DETECTED Final   Parainfluenza  Virus 4 NOT DETECTED NOT DETECTED Final   Respiratory Syncytial Virus NOT DETECTED NOT DETECTED Final   Bordetella pertussis NOT DETECTED NOT DETECTED Final   Bordetella Parapertussis NOT DETECTED NOT DETECTED Final   Chlamydophila pneumoniae NOT DETECTED NOT DETECTED Final   Mycoplasma pneumoniae NOT DETECTED NOT DETECTED Final    Comment: Performed at [facility], [place] [address], [place], [place] [address]  Urine Culture     Status: None   Collection Time: 07/23/22  6:00 AM   Specimen:  Urine, Clean Catch  Result Value Ref Range Status   Specimen Description   Final    URINE, CLEAN CATCH Performed at [facility], [address] [place], [place] [address]    Special Requests   Final    Normal Performed at [facility], [address], [place], [place] [address]    Culture   Final    NO GROWTH Performed at [facility], [place] [address] [place], [place] [address]    Report Status 07/24/2022 FINAL  Final    Radiology Studies: US RENAL  Result Date: 07/25/2022 CLINICAL DATA:  [id number] AKI (acute kidney injury) ([place]) [id number] EXAM: RENAL / URINARY TRACT ULTRASOUND COMPLETE COMPARISON:  May 20, 2022 FINDINGS: Evaluation is limited by body habitus, suboptimal acoustic windows and bowel gas. Right Kidney: Renal measurements: 10.5 by 6.2 x 5.9 cm = volume: 198 mL. Echogenicity within normal limits. No suspicious mass or hydronephrosis visualized. Benign cyst of the superior pole measures 14 mm (for which no dedicated imaging follow-up is recommended). There is an additional 10 mm benign cyst of the inferior pole (for which no dedicated imaging follow-up is recommended). Left Kidney: Not visualized secondary to limitations described above. Bladder: Not visualized. Other: Small volume ascites is noted. Incidental note of a LEFT pleural effusion. IMPRESSION: 1. Evaluation is limited by body habitus, suboptimal acoustic windows and bowel gas. The left kidney and bladder were not visualized sonographically due to these limitations. 2. No evidence of RIGHT-sided hydronephrosis. 3. Incidental note of LEFT pleural effusion and small volume ascites. Electronically Signed   By: Valentino Saxon M.D.   On: 07/25/2022 11:57      Taha Dimond T. [place]  If 7PM-7AM, please contact night-coverage [email or web address] 07/25/2022, 5:45 PM

## 2022-07-25 NOTE — Progress Notes (Signed)
Patient refused her BIPAP. This RN has tried multiple times and she indicated she will let me know when she is ready to go back on it. RT is aware. Will try again later.

## 2022-07-25 NOTE — Progress Notes (Signed)
[facility] Cardiology    SUBJECTIVE: Patient states to feel somewhat better improved shortness of breath no chest pain no palpitations or tachycardia not as lethargic and fatigued today as she was a few days ago husband in the room patient currently getting a breathing treatment but feels reasonably well   Vitals:   07/25/22 0559 07/25/22 0600 07/25/22 0837 07/25/22 0846  BP:   107/82   Pulse:   (!) 103 (!) 107  Resp: 16 18 (!) 21 20  Temp:      TempSrc:      SpO2:   100% 97%  Weight:      Height:         Intake/Output Summary (Last 24 hours) at 07/25/2022 3875 Last data filed at 07/25/2022 0700 Gross per 24 hour  Intake 833.9 ml  Output 100 ml  Net 733.9 ml      PHYSICAL EXAM  General: Well developed, well nourished, in no acute distress HEENT:  Normocephalic and atramatic Neck:  No JVD.  Lungs: Clear bilaterally to auscultation and percussion. Heart: HRRR . Normal S1 and S2 without gallops or murmurs.  Abdomen: Bowel sounds are positive, abdomen soft and non-tender  Msk:  Back normal, normal gait. Normal strength and tone for age. Extremities: No clubbing, cyanosis or edema.   Neuro: Alert and oriented X 3. Psych:  Good affect, responds appropriately   LABS: Basic Metabolic Panel: Recent Labs    07/24/22 0551 07/25/22 0549  NA 136 136  K 4.1 4.5  CL 94* 92*  CO2 30 29  GLUCOSE 89 72  BUN 58* 64*  CREATININE 2.26* 2.63*  CALCIUM 8.2* 8.2*  MG  --  2.5*   Liver Function Tests: Recent Labs    07/23/22 0324  AST 179*  ALT 134*  ALKPHOS 137*  BILITOT 1.8*  PROT 6.7  ALBUMIN 3.0*   No results for input(s): "LIPASE", "AMYLASE" in the last 72 hours. CBC: Recent Labs    07/24/22 0551 07/25/22 0549  WBC 11.0* 11.3*  HGB 10.5* 10.7*  HCT 37.1 37.3  MCV 91.4 90.3  PLT 359 373   Cardiac Enzymes: No results for input(s): "CKTOTAL", "CKMB", "CKMBINDEX", "TROPONINI" in the last 72 hours. BNP: Invalid input(s): "POCBNP" D-Dimer: No results for input(s):  "DDIMER" in the last 72 hours. Hemoglobin A1C: No results for input(s): "HGBA1C" in the last 72 hours. Fasting Lipid Panel: No results for input(s): "CHOL", "HDL", "LDLCALC", "TRIG", "CHOLHDL", "LDLDIRECT" in the last 72 hours. Thyroid Function Tests: No results for input(s): "TSH", "T4TOTAL", "T3FREE", "THYROIDAB" in the last 72 hours.  Invalid input(s): "FREET3" Anemia Panel: No results for input(s): "VITAMINB12", "FOLATE", "FERRITIN", "TIBC", "IRON", "RETICCTPCT" in the last 72 hours.  No results found.   Echo mild reduced left ventricular function EF around 45 to 50%  TELEMETRY: Sinus rhythm 95 nonspecific ST-T wave changes:  ASSESSMENT AND PLAN:  Principal Problem:   Paroxysmal atrial fibrillation with RVR (HCC) Active Problems:   Type 2 diabetes mellitus with diabetic neuropathy, without long-term current use of insulin (HCC)   Hypothyroidism   HLD (hyperlipidemia)   Elevated troponin   OSA (obstructive sleep apnea)   Acute respiratory failure with hypoxia (HCC)   Acute on chronic respiratory failure with hypoxia and hypercapnia (HCC)   Thoracic aortic aneurysm (HCC)   Hyperkalemia   Hypotension   Leukocytosis   Normocytic anemia   Acute pulmonary edema (HCC)   HAP (hospital-acquired pneumonia)   HFrEF (heart failure with reduced ejection fraction) ([place])  Plan Atrial fibrillation tachycardia somewhat improved heart rate in the 80s to 90s sinus continue current therapy Respiratory failure still requiring inhalers occasional BiPAP and supplemental oxygen continue aggressive respiratory support Obstructive sleep apnea by history maintain CPAP at night and when sleeping Obesity recommend significant weight loss exercise portion control Diabetes continue current management to maintain adequate blood sugar including diabetic diet Generalized weakness fatigue lethargy would recommend physical and Occupational Therapy and increased activity   Yolonda Kida,  MD 07/25/2022 9:26 AM

## 2022-07-25 NOTE — Progress Notes (Addendum)
Patient agreed to go on  her BIPAP

## 2022-07-25 NOTE — Plan of Care (Signed)

## 2022-07-25 NOTE — Progress Notes (Signed)
Switched from lg to medium ffm for better fit

## 2022-07-26 DIAGNOSIS — I480 Paroxysmal atrial fibrillation: Secondary | ICD-10-CM | POA: Diagnosis not present

## 2022-07-26 DIAGNOSIS — G4733 Obstructive sleep apnea (adult) (pediatric): Secondary | ICD-10-CM | POA: Diagnosis not present

## 2022-07-26 DIAGNOSIS — J9621 Acute and chronic respiratory failure with hypoxia: Secondary | ICD-10-CM | POA: Diagnosis not present

## 2022-07-26 DIAGNOSIS — E1140 Type 2 diabetes mellitus with diabetic neuropathy, unspecified: Secondary | ICD-10-CM | POA: Diagnosis not present

## 2022-07-26 LAB — BASIC METABOLIC PANEL
Anion gap: 13 (ref 5–15)
BUN: 70 mg/dL — ABNORMAL HIGH (ref 8–23)
CO2: 29 mmol/L (ref 22–32)
Calcium: 7.9 mg/dL — ABNORMAL LOW (ref 8.9–10.3)
Chloride: 92 mmol/L — ABNORMAL LOW (ref 98–111)
Creatinine, Ser: 2.71 mg/dL — ABNORMAL HIGH (ref 0.44–1.00)
GFR, Estimated: 19 mL/min — ABNORMAL LOW (ref >60)
Glucose, Bld: 84 mg/dL (ref 70–99)
Potassium: 4.6 mmol/L (ref 3.5–5.1)
Sodium: 134 mmol/L — ABNORMAL LOW (ref 135–145)

## 2022-07-26 LAB — CBC
HCT: 40.6 % (ref 36.0–46.0)
Hemoglobin: 11.6 g/dL — ABNORMAL LOW (ref 12.0–15.0)
MCH: 25.9 pg — ABNORMAL LOW (ref 26.0–34.0)
MCHC: 28.6 g/dL — ABNORMAL LOW (ref 30.0–36.0)
MCV: 90.6 fL (ref 80.0–100.0)
Platelets: 374 10*3/uL (ref 150–400)
RBC: 4.48 MIL/uL (ref 3.87–5.11)
RDW: 24.5 % — ABNORMAL HIGH (ref 11.5–15.5)
WBC: 12.8 10*3/uL — ABNORMAL HIGH (ref 4.0–10.5)
nRBC: 0.5 % — ABNORMAL HIGH (ref 0.0–0.2)

## 2022-07-26 LAB — MAGNESIUM: Magnesium: 2.4 mg/dL (ref 1.7–2.4)

## 2022-07-26 LAB — BRAIN NATRIURETIC PEPTIDE: B Natriuretic Peptide: 2177.1 pg/mL — ABNORMAL HIGH (ref 0.0–100.0)

## 2022-07-26 NOTE — Progress Notes (Addendum)
PROGRESS NOTE  Madison Mcbride NGE:[id number] DOB: 14-Sep-1955   PCP: Rusty Aus, MD  Patient is from: SNF  DOA: 07/30/2022 LOS: 42  Chief complaints Chief Complaint  Patient presents with   Shortness of Breath     Brief Narrative / Interim history: 67 year old F with PMH of A-fib on Eliquis, diastolic CHF, DM-2, HTN, HLD, hypothyroidism, thyroid cancer, anxiety, depression, morbid obesity, chronic back pain, OSA on CPAP and CKD-3A who was sent back by SNF on the day of discharge due to tachycardia on intake.  Pt husband states that she appeared diaphoretic and slightly dyspneic, and somnolent.  Upon admission initially placed on BiPAP for acute on chronic hypoxic and hypercapnic respiratory failure and started on vancomycin, cefepime and Flagyl for possible hospital-acquired pneumonia.  Patient remained hypotensive despite of IV fluids therefore required Levophed and was monitored in the ICU.  Cardiology team was consulted.  There were signs of volume overload and increasing work of breathing on presentation and she was started on IV Lasix.  She also had some metolazone in addition to IV contrast with CT chest to rule out PE.  As a result, she developed AKI.  Nephrology consulted and following.  Subjective: Seen and examined earlier this morning.  She was sleepy with BiPAP on.  Wakes to voice.  Able to follow command but not engaging in conversation.   Objective: Vitals:   07/26/22 0400 07/26/22 0836 07/26/22 0837 07/26/22 1326  BP:   100/62 92/61  Pulse:  100 100 100  Resp: '16 18 16 16  '$ Temp:   (!) 97.1 F (36.2 C) 97.8 F (36.6 C)  TempSrc:      SpO2:  95% 95% 95%  Weight:      Height:        Examination:   GENERAL: No apparent distress.  Nontoxic. HEENT: MMM.  Vision and hearing grossly intact.  NECK: Supple.  No apparent JVD.  RESP: BiPAP on.  No IWOB.  Fair aeration bilaterally. CVS:  RRR. Heart sounds normal.  ABD/GI/GU: BS+. Abd soft, NTND.  MSK/EXT:  Moves  extremities. No apparent deformity. No edema.  SKIN: no apparent skin lesion or wound NEURO: Sleepy but wakes to voice.  Follows commands.  No apparent focal neuro deficit. PSYCH: Calm. Normal affect.     Microbiology summarized: RVP and MRSA PCR screen nonreactive. Blood cultures NGTD  Assessment and plan: Principal Problem:   Paroxysmal atrial fibrillation with RVR (HCC) Active Problems:   Acute on chronic respiratory failure with hypoxia and hypercapnia (HCC)   HLD (hyperlipidemia)   OSA (obstructive sleep apnea)   Type 2 diabetes mellitus with diabetic neuropathy, without long-term current use of insulin (HCC)   Elevated troponin   Hypothyroidism   Acute respiratory failure with hypoxia (HCC)   Thoracic aortic aneurysm (HCC)   Hyperkalemia   Hypotension   Leukocytosis   Normocytic anemia   Acute pulmonary edema (HCC)   HAP (hospital-acquired pneumonia)   HFrEF (heart failure with reduced ejection fraction) (HCC)  Acute respiratory failure with hypoxia and hypercapnia-due to OSA/OHS, and possible hospital-acquired pneumonia and and fluid overload.  Pro-Cal 0.5.  Initial BNP 2300.  -Zosyn from 1/6 - 1/9> IV Unasyn> Augmentin -On bronchodilators, scheduled and as needed, incentive spirometer and flutter valve. -Minimize oxygen use   Afib with RVR: HR in the range of 90s to 100s. -Cardiology following -Continue metoprolol 50 mg 3 times daily and Cardizem CD120 mg daily -IV metoprolol 5 mg every 6 hours as needed -  Continue anticoagulation   AKI on CKD-3B: B/l  Cr seems to be about 1.2.  Renal US suboptimal due to body habitus and bowel gas Recent Labs    07/18/22 0652 07/18/22 2044 07/19/22 0541 07/20/22 0246 07/21/22 0520 07/22/22 0541 07/23/22 0324 07/24/22 0551 07/25/22 0549 07/26/22 0717  BUN 66* 67* 62* 50* 41* 40* 46* 58* 64* 70*  CREATININE 1.67* 1.69* 1.59* 1.41* 1.22* 1.26* 1.67* 2.26* 2.63* 2.71*  -Appreciate input by nephrology. -Update  echocardiogram.  HFmrEF: TTE in 02/2022 with LVEF of 45 to 50% and normal DD.  Appears euvolemic on exam but difficult due to body habitus.  BNP markedly elevated likely indicating poor prognosis although in the setting of renal failure. -Update echocardiogram -Cardiology and nephrology following.  Controlled DM-2 with CKD-3B and hypoglycemia: A1c 5.3% in 06/2022.  Hypoglycemia resolved.  Restless leg syndrome -Continue home Requip   Hypothyroidism  -Continue levothyroxine   Chronic venous stasis dermatitis of both lower extremities  Anxiety/depression/chronic pain: Stable -Continue home meds.   OSA/OHS -BiPAP at night and when she sleeps   Physical deconditioning: Patient is from SNF. -PT/OT  Morbid obesity: Could be contributing to physical deconditioning and respiratory failure. Body mass index is 49.82 kg/m.           DVT prophylaxis:   apixaban (ELIQUIS) tablet 5 mg  Code Status: Full code Family Communication: None at bedside Level of care: Telemetry Cardiac Status is: Inpatient Remains inpatient appropriate because: AKI   Final disposition: SNF Consultants:  Pulmonology Cardiology Nephrology  35 minutes with more than 50% spent in reviewing records, counseling patient/family and coordinating care.   Sch Meds:  Scheduled Meds:  amoxicillin-clavulanate  1 tablet Oral BID   apixaban  5 mg Oral BID   atorvastatin  20 mg Oral Daily   budesonide (PULMICORT) nebulizer solution  0.25 mg Nebulization BID   buPROPion  150 mg Oral Daily   diltiazem  120 mg Oral Daily   ipratropium-albuterol  3 mL Nebulization TID   levothyroxine  250 mcg Oral Q0600   metoprolol tartrate  50 mg Oral TID   pantoprazole  40 mg Oral Daily   rOPINIRole  3 mg Oral QHS   Continuous Infusions: PRN Meds:.acetaminophen, guaiFENesin, levalbuterol, liver oil-zinc oxide, metoprolol tartrate, ondansetron (ZOFRAN) IV, oxyCODONE, polyethylene glycol,  senna-docusate  Antimicrobials: Anti-infectives (From admission, onward)    Start     Dose/Rate Route Frequency Ordered Stop   07/25/22 1200  amoxicillin-clavulanate (AUGMENTIN) 500-125 MG per tablet 1 tablet        1 tablet Oral 2 times daily 07/25/22 1102 07/27/22 0959   07/24/22 1400  Ampicillin-Sulbactam (UNASYN) 3 g in sodium chloride 0.9 % 100 mL IVPB  Status:  Discontinued        3 g 200 mL/hr over 30 Minutes Intravenous Every 6 hours 07/24/22 1228 07/24/22 1231   07/24/22 1330  amoxicillin-clavulanate (AUGMENTIN) 875-125 MG per tablet 1 tablet  Status:  Discontinued        1 tablet Oral Every 12 hours 07/24/22 1231 07/25/22 1102   07/21/22 1500  Ampicillin-Sulbactam (UNASYN) 3 g in sodium chloride 0.9 % 100 mL IVPB        3 g 200 mL/hr over 30 Minutes Intravenous Every 6 hours 07/21/22 1357 07/22/22 2216   07/18/22 1900  azithromycin (ZITHROMAX) 500 mg in sodium chloride 0.9 % 250 mL IVPB  Status:  Discontinued        500 mg 250 mL/hr over 60 Minutes Intravenous Every 24  hours 07/18/22 0205 07/19/22 1204   07/18/22 1000  piperacillin-tazobactam (ZOSYN) IVPB 3.375 g  Status:  Discontinued        3.375 g 12.5 mL/hr over 240 Minutes Intravenous Every 8 hours 07/18/22 0842 07/21/22 1357   07/18/22 0800  cefTRIAXone (ROCEPHIN) 1 g in sodium chloride 0.9 % 100 mL IVPB  Status:  Discontinued        1 g 200 mL/hr over 30 Minutes Intravenous Every 24 hours 07/18/22 0205 07/18/22 0829   07/15/2022 1915  vancomycin (VANCOREADY) IVPB 2000 mg/400 mL        2,000 mg 200 mL/hr over 120 Minutes Intravenous  Once 07/21/2022 1914 08/10/2022 2250   07/20/2022 1900  ceFEPIme (MAXIPIME) 2 g in sodium chloride 0.9 % 100 mL IVPB        2 g 200 mL/hr over 30 Minutes Intravenous  Once 07/19/2022 1853 07/18/22 1900   08/11/2022 1900  azithromycin (ZITHROMAX) 500 mg in sodium chloride 0.9 % 250 mL IVPB        500 mg 250 mL/hr over 60 Minutes Intravenous  Once 07/14/2022 1853 07/18/22 1900        I have  personally reviewed the following labs and images: CBC: Recent Labs  Lab 07/22/22 0541 07/23/22 0324 07/24/22 0551 07/25/22 0549 07/26/22 0717  WBC 10.6* 11.6* 11.0* 11.3* 12.8*  HGB 10.4* 10.7* 10.5* 10.7* 11.6*  HCT 37.0 37.5 37.1 37.3 40.6  MCV 91.6 90.6 91.4 90.3 90.6  PLT 334 334 359 373 374   BMP &GFR Recent Labs  Lab 07/22/22 0541 07/23/22 0324 07/24/22 0551 07/25/22 0549 07/26/22 0717  NA 139 138 136 136 134*  K 4.4 4.2 4.1 4.5 4.6  CL 96* 94* 94* 92* 92*  CO2 31 32 '30 29 29  '$ GLUCOSE 86 91 89 72 84  BUN 40* 46* 58* 64* 70*  CREATININE 1.26* 1.67* 2.26* 2.63* 2.71*  CALCIUM 8.5* 8.3* 8.2* 8.2* 7.9*  MG  --   --   --  2.5* 2.4   Estimated Creatinine Clearance: 27.8 mL/min (A) (by C-G formula based on SCr of 2.71 mg/dL (H)). Liver & Pancreas: Recent Labs  Lab 07/21/22 0520 07/22/22 0541 07/23/22 0324  AST 118* 80* 179*  ALT 132* 98* 134*  ALKPHOS 127* 132* 137*  BILITOT 1.4* 1.2 1.8*  PROT 7.5 6.9 6.7  ALBUMIN 3.4* 3.1* 3.0*   No results for input(s): "LIPASE", "AMYLASE" in the last 168 hours. No results for input(s): "AMMONIA" in the last 168 hours. Diabetic: No results for input(s): "HGBA1C" in the last 72 hours. Recent Labs  Lab 07/23/22 2142 07/24/22 0008 07/24/22 0257 07/24/22 0332 07/24/22 0538  GLUCAP 87 71 62* 71 94   Cardiac Enzymes: No results for input(s): "CKTOTAL", "CKMB", "CKMBINDEX", "TROPONINI" in the last 168 hours. No results for input(s): "PROBNP" in the last 8760 hours. Coagulation Profile: No results for input(s): "INR", "PROTIME" in the last 168 hours. Thyroid Function Tests: No results for input(s): "TSH", "T4TOTAL", "FREET4", "T3FREE", "THYROIDAB" in the last 72 hours. Lipid Profile: No results for input(s): "CHOL", "HDL", "LDLCALC", "TRIG", "CHOLHDL", "LDLDIRECT" in the last 72 hours. Anemia Panel: No results for input(s): "VITAMINB12", "FOLATE", "FERRITIN", "TIBC", "IRON", "RETICCTPCT" in the last 72 hours. Urine  analysis:    Component Value Date/Time   COLORURINE AMBER (A) 07/08/2022 2201   APPEARANCEUR CLOUDY (A) 07/08/2022 2201   APPEARANCEUR CLOUDY 01/15/2014 0209   LABSPEC 1.015 07/08/2022 2201   LABSPEC 1.014 01/15/2014 0209   PHURINE 5.0 07/08/2022 2201  GLUCOSEU NEGATIVE 07/08/2022 2201   GLUCOSEU NEGATIVE 01/15/2014 0209   HGBUR MODERATE (A) 07/08/2022 2201   BILIRUBINUR NEGATIVE 07/08/2022 2201   BILIRUBINUR NEGATIVE 01/15/2014 0209   KETONESUR NEGATIVE 07/08/2022 2201   PROTEINUR NEGATIVE 07/08/2022 2201   NITRITE NEGATIVE 07/08/2022 2201   LEUKOCYTESUR LARGE (A) 07/08/2022 2201   LEUKOCYTESUR 2+ 01/15/2014 0209   Sepsis Labs: Invalid input(s): "PROCALCITONIN", "LACTICIDVEN"  Microbiology: Recent Results (from the past 240 hour(s))  Blood culture (routine x 2)     Status: None   Collection Time: 08/02/2022 11:01 PM   Specimen: BLOOD  Result Value Ref Range Status   Specimen Description BLOOD BLOOD RIGHT WRIST  Final   Special Requests   Final    BOTTLES DRAWN AEROBIC AND ANAEROBIC Blood Culture results may not be optimal due to an inadequate volume of blood received in culture bottles   Culture   Final    NO GROWTH 5 DAYS Performed at [facility], [address], [place], [place] [address]    Report Status 07/22/2022 FINAL  Final  Blood culture (routine x 2)     Status: None   Collection Time: 07/18/2022 11:01 PM   Specimen: BLOOD  Result Value Ref Range Status   Specimen Description BLOOD BLOOD LEFT HAND  Final   Special Requests IN PEDIATRIC BOTTLE Blood Culture adequate volume  Final   Culture   Final    NO GROWTH 5 DAYS Performed at [facility], [address] [place], [place] [address]    Report Status 07/22/2022 FINAL  Final  MRSA Next Gen by PCR, Nasal     Status: None   Collection Time: 07/18/22 10:11 AM   Specimen: Nasal Mucosa; Nasal Swab  Result Value Ref Range Status   MRSA by PCR Next Gen NOT DETECTED NOT DETECTED Final     Comment: (NOTE) The GeneXpert MRSA Assay (FDA approved for NASAL specimens only), is one component of a comprehensive MRSA colonization surveillance program. It is not intended to diagnose MRSA infection nor to guide or monitor treatment for MRSA infections. Test performance is not FDA approved in patients less than 70 years old. Performed at [facility], [place],  [address]   Respiratory (~20 pathogens) panel by PCR     Status: None   Collection Time: 07/18/22 11:07 AM   Specimen: Nasopharyngeal Swab; Respiratory  Result Value Ref Range Status   Adenovirus NOT DETECTED NOT DETECTED Final   Coronavirus 229E NOT DETECTED NOT DETECTED Final    Comment: (NOTE) The Coronavirus on the Respiratory Panel, DOES NOT test for the novel  Coronavirus (2019 nCoV)    Coronavirus HKU1 NOT DETECTED NOT DETECTED Final   Coronavirus NL63 NOT DETECTED NOT DETECTED Final   Coronavirus OC43 NOT DETECTED NOT DETECTED Final   Metapneumovirus NOT DETECTED NOT DETECTED Final   Rhinovirus / Enterovirus NOT DETECTED NOT DETECTED Final   Influenza A NOT DETECTED NOT DETECTED Final   Influenza B NOT DETECTED NOT DETECTED Final   Parainfluenza Virus 1 NOT DETECTED NOT DETECTED Final   Parainfluenza Virus 2 NOT DETECTED NOT DETECTED Final   Parainfluenza Virus 3 NOT DETECTED NOT DETECTED Final   Parainfluenza Virus 4 NOT DETECTED NOT DETECTED Final   Respiratory Syncytial Virus NOT DETECTED NOT DETECTED Final   Bordetella pertussis NOT DETECTED NOT DETECTED Final   Bordetella Parapertussis NOT DETECTED NOT DETECTED Final   Chlamydophila pneumoniae NOT DETECTED NOT DETECTED Final   Mycoplasma pneumoniae NOT DETECTED  NOT DETECTED Final    Comment: Performed at [facility], [place] [address] [place], [place] [address]  Urine Culture     Status: None   Collection Time: 07/23/22  6:00 AM   Specimen: Urine, Clean Catch  Result Value Ref Range Status   Specimen Description    Final    URINE, CLEAN CATCH Performed at [facility], [address], [place], [place] [address]    Special Requests   Final    Normal Performed at [facility], [address] [place], [place] [address]    Culture   Final    NO GROWTH Performed at [facility], [place] [address] [place], [place] [address]    Report Status 07/24/2022 FINAL  Final    Radiology Studies: No results found.    Kash Davie T. [place]  If 7PM-7AM, please contact night-coverage [email or web address] 07/26/2022, 2:13 PM

## 2022-07-26 NOTE — Progress Notes (Signed)
[facility]  ROUNDING NOTE   Subjective:   [name] with a past medical history of chronic diastolic heart failure, morbid obesity, hypothyroidism, diabetes mellitus type 2, obstructive sleep apnea, lymphedema, and chronic kidney disease stage IIIa.  Patient presents to the emergency department with complaints of shortness of breath.  She has been admitted for Acute pulmonary edema (HCC) [J81.0] Atrial fibrillation with RVR (HCC) [I48.91] HCAP (healthcare-associated pneumonia) [J18.9] Acute on chronic respiratory failure with hypoxia and hypercapnia ([place]) [E26.83, J96.22]  Patient is known to our practice and is seen outpatient by Dr. Holley Raring.  Patient was last seen in our office on April 03, 2021.  Patient has been lost to follow-up since that time.  Patient seen resting in bed, myoclonic jerking noted.  No family at bedside.  Patient experiencing severely delayed responses and the inability to organize thoughts.  History obtained through chart review.  It appears patient was recently discharged from skilled nursing facility but was found to be diaphoretic and dyspneic by her husband.  Patient was found to be tachycardic at that time.  It appears patient denied any known fever or chills, nausea, vomiting, diarrhea, abdominal pain, dark stools, hematuria, or flank pain on admission.  We have been consulted due to worsening renal function.  Creatinine 1.61 with GFR 35 on day of admission.  Baseline appears to be creatinine 1.35 with GFR 54 on 07/16/2022.  Renal ultrasound negative for renal obstruction.  Echo from 03/06/2022 shows EF 45 to 50% with a mild to moderate MVR, TVR, and AVR.  Patient has received furosemide and metolazone during this admission.  CT angio chest with and without contrast negative for pulmonary embolism, shows some small bilateral pleural effusions with moderate coronary artery calcification.   Objective:  Vital signs in last 24 hours:  Temp:  [97.1  F (36.2 C)-98 F (36.7 C)] 97.1 F (36.2 C) (01/14 0837) Pulse Rate:  [89-107] 100 (01/14 0837) Resp:  [14-20] 16 (01/14 0837) BP: (98-116)/(62-83) 100/62 (01/14 0837) SpO2:  [91 %-96 %] 95 % (01/14 0837) FiO2 (%):  [30 %] 30 % (01/14 0836)  Weight change:  Filed Weights   07/21/22 0500 07/22/22 0321 07/23/22 0500  Weight: (!) 136.6 kg (!) 140 kg 135.8 kg    Intake/Output: I/O last 3 completed shifts: In: 1083.9 [P.O.:600; I.V.:483.9] Out: 100 [Urine:100]   Intake/Output this shift:  Total I/O In: 240 [P.O.:240] Out: -   Physical Exam: General: NAD  Head: Normocephalic, atraumatic. Moist oral mucosal membranes  Eyes: Anicteric  Lungs:  Clear to auscultation, normal effort, NCO2  Heart: Regular rate and rhythm  Abdomen:  Soft, nontender, obese  Extremities: Trace to 1+ peripheral edema.  Neurologic: Alert, myoclonic jerking present  Skin: No lesions  Access: None    Basic Metabolic Panel: Recent Labs  Lab 07/22/22 0541 07/23/22 0324 07/24/22 0551 07/25/22 0549 07/26/22 0717  NA 139 138 136 136 134*  K 4.4 4.2 4.1 4.5 4.6  CL 96* 94* 94* 92* 92*  CO2 31 32 '30 29 29  '$ GLUCOSE 86 91 89 72 84  BUN 40* 46* 58* 64* 70*  CREATININE 1.26* 1.67* 2.26* 2.63* 2.71*  CALCIUM 8.5* 8.3* 8.2* 8.2* 7.9*  MG  --   --   --  2.5* 2.4    Liver Function Tests: Recent Labs  Lab 07/21/22 0520 07/22/22 0541 07/23/22 0324  AST 118* 80* 179*  ALT 132* 98* 134*  ALKPHOS 127* 132* 137*  BILITOT 1.4* 1.2 1.8*  PROT 7.5 6.9 6.7  ALBUMIN 3.4* 3.1* 3.0*   No results for input(s): "LIPASE", "AMYLASE" in the last 168 hours. No results for input(s): "AMMONIA" in the last 168 hours.  CBC: Recent Labs  Lab 07/22/22 0541 07/23/22 0324 07/24/22 0551 07/25/22 0549 07/26/22 0717  WBC 10.6* 11.6* 11.0* 11.3* 12.8*  HGB 10.4* 10.7* 10.5* 10.7* 11.6*  HCT 37.0 37.5 37.1 37.3 40.6  MCV 91.6 90.6 91.4 90.3 90.6  PLT 334 334 359 373 374    Cardiac Enzymes: No results for  input(s): "CKTOTAL", "CKMB", "CKMBINDEX", "TROPONINI" in the last 168 hours.  BNP: Invalid input(s): "POCBNP"  CBG: Recent Labs  Lab 07/23/22 2142 07/24/22 0008 07/24/22 0257 07/24/22 0332 07/24/22 0538  GLUCAP 87 71 62* 71 94    Microbiology: Results for orders placed or performed during the hospital encounter of 07/21/2022  Blood culture (routine x 2)     Status: None   Collection Time: 07/23/2022 11:01 PM   Specimen: BLOOD  Result Value Ref Range Status   Specimen Description BLOOD BLOOD RIGHT WRIST  Final   Special Requests   Final    BOTTLES DRAWN AEROBIC AND ANAEROBIC Blood Culture results may not be optimal due to an inadequate volume of blood received in culture bottles   Culture   Final    NO GROWTH 5 DAYS Performed at [facility], [address], [place], [place] [address]    Report Status 07/22/2022 FINAL  Final  Blood culture (routine x 2)     Status: None   Collection Time: 07/24/2022 11:01 PM   Specimen: BLOOD  Result Value Ref Range Status   Specimen Description BLOOD BLOOD LEFT HAND  Final   Special Requests IN PEDIATRIC BOTTLE Blood Culture adequate volume  Final   Culture   Final    NO GROWTH 5 DAYS Performed at [facility], [address] [place], [place] [address]    Report Status 07/22/2022 FINAL  Final  MRSA Next Gen by PCR, Nasal     Status: None   Collection Time: 07/18/22 10:11 AM   Specimen: Nasal Mucosa; Nasal Swab  Result Value Ref Range Status   MRSA by PCR Next Gen NOT DETECTED NOT DETECTED Final    Comment: (NOTE) The GeneXpert MRSA Assay (FDA approved for NASAL specimens only), is one component of a comprehensive MRSA colonization surveillance program. It is not intended to diagnose MRSA infection nor to guide or monitor treatment for MRSA infections. Test performance is not FDA approved in patients less than 23 years old. Performed at [facility], [place], [place] [address]    Respiratory (~20 pathogens) panel by PCR     Status: None   Collection Time: 07/18/22 11:07 AM   Specimen: Nasopharyngeal Swab; Respiratory  Result Value Ref Range Status   Adenovirus NOT DETECTED NOT DETECTED Final   Coronavirus 229E NOT DETECTED NOT DETECTED Final    Comment: (NOTE) The Coronavirus on the Respiratory Panel, DOES NOT test for the novel  Coronavirus (2019 nCoV)    Coronavirus HKU1 NOT DETECTED NOT DETECTED Final   Coronavirus NL63 NOT DETECTED NOT DETECTED Final   Coronavirus OC43 NOT DETECTED NOT DETECTED Final   Metapneumovirus NOT DETECTED NOT DETECTED Final   Rhinovirus / Enterovirus NOT DETECTED NOT DETECTED Final   Influenza A NOT DETECTED NOT DETECTED Final   Influenza B NOT DETECTED NOT DETECTED Final   Parainfluenza Virus 1 NOT DETECTED NOT DETECTED Final   Parainfluenza Virus  2 NOT DETECTED NOT DETECTED Final   Parainfluenza Virus 3 NOT DETECTED NOT DETECTED Final   Parainfluenza Virus 4 NOT DETECTED NOT DETECTED Final   Respiratory Syncytial Virus NOT DETECTED NOT DETECTED Final   Bordetella pertussis NOT DETECTED NOT DETECTED Final   Bordetella Parapertussis NOT DETECTED NOT DETECTED Final   Chlamydophila pneumoniae NOT DETECTED NOT DETECTED Final   Mycoplasma pneumoniae NOT DETECTED NOT DETECTED Final    Comment: Performed at [facility], [place] [address] [place], [place] [address]  Urine Culture     Status: None   Collection Time: 07/23/22  6:00 AM   Specimen: Urine, Clean Catch  Result Value Ref Range Status   Specimen Description   Final    URINE, CLEAN CATCH Performed at [facility], [address] [place], [place] [address]    Special Requests   Final    Normal Performed at [facility], [address], [place], [place] [address]    Culture   Final    NO GROWTH Performed at [facility], [place] [address] [place],  [address]    Report Status 07/24/2022 FINAL  Final    Coagulation Studies: No  results for input(s): "LABPROT", "INR" in the last 72 hours.  Urinalysis: No results for input(s): "COLORURINE", "LABSPEC", "PHURINE", "GLUCOSEU", "HGBUR", "BILIRUBINUR", "KETONESUR", "PROTEINUR", "UROBILINOGEN", "NITRITE", "LEUKOCYTESUR" in the last 72 hours.  Invalid input(s): "APPERANCEUR"    Imaging: US RENAL  Result Date: 07/25/2022 CLINICAL DATA:  [id number] AKI (acute kidney injury) ([place]) [id number] EXAM: RENAL / URINARY TRACT ULTRASOUND COMPLETE COMPARISON:  May 20, 2022 FINDINGS: Evaluation is limited by body habitus, suboptimal acoustic windows and bowel gas. Right Kidney: Renal measurements: 10.5 by 6.2 x 5.9 cm = volume: 198 mL. Echogenicity within normal limits. No suspicious mass or hydronephrosis visualized. Benign cyst of the superior pole measures 14 mm (for which no dedicated imaging follow-up is recommended). There is an additional 10 mm benign cyst of the inferior pole (for which no dedicated imaging follow-up is recommended). Left Kidney: Not visualized secondary to limitations described above. Bladder: Not visualized. Other: Small volume ascites is noted. Incidental note of a LEFT pleural effusion. IMPRESSION: 1. Evaluation is limited by body habitus, suboptimal acoustic windows and bowel gas. The left kidney and bladder were not visualized sonographically due to these limitations. 2. No evidence of RIGHT-sided hydronephrosis. 3. Incidental note of LEFT pleural effusion and small volume ascites. Electronically Signed   By: Valentino Saxon M.D.   On: 07/25/2022 11:57     Medications:     amoxicillin-clavulanate  1 tablet Oral BID   apixaban  5 mg Oral BID   atorvastatin  20 mg Oral Daily   budesonide (PULMICORT) nebulizer solution  0.25 mg Nebulization BID   buPROPion  150 mg Oral Daily   diltiazem  120 mg Oral Daily   ipratropium-albuterol  3 mL Nebulization TID   levothyroxine  250 mcg Oral Q0600   metoprolol tartrate  50 mg Oral TID   pantoprazole  40 mg Oral Daily    rOPINIRole  3 mg Oral QHS   acetaminophen, guaiFENesin, levalbuterol, liver oil-zinc oxide, metoprolol tartrate, ondansetron (ZOFRAN) IV, oxyCODONE, polyethylene glycol, senna-docusate  Assessment/ Plan:  Ms. Madison Mcbride is a 67 y.o.  female  past medical history of chronic diastolic heart failure, morbid obesity, hypothyroidism, diabetes mellitus type 2, obstructive sleep apnea, lymphedema, and chronic kidney disease stage IIIa.  Patient presents to the emergency department with complaints of  shortness of breath.  She has been admitted for Acute pulmonary edema (HCC) [J81.0] Atrial fibrillation with RVR (HCC) [I48.91] HCAP (healthcare-associated pneumonia) [J18.9] Acute on chronic respiratory failure with hypoxia and hypercapnia (HCC) [Q76.22, J96.22]   Acute Kidney Injury on chronic kidney disease stage IIIa with baseline creatinine 1.35 and GFR of 54 on 07/16/22.  Acute kidney injury appears multifactorial due to IV contrast exposure and aggressive diuresis.  IV contrast exposure noted on 07/30/2022.  Renal ultrasound negative for obstruction.  Agree with held diuretics at this time.  Patient may actually require gentle hydration.  Will monitor kidney function in a.m.  Continue to avoid nephrotoxic agents and therapies, including hypotension.  No acute need for dialysis at this time but monitoring closely.   Lab Results  Component Value Date   CREATININE 2.71 (H) 07/26/2022   CREATININE 2.63 (H) 07/25/2022   CREATININE 2.26 (H) 07/24/2022    Intake/Output Summary (Last 24 hours) at 07/26/2022 1111 Last data filed at 07/26/2022 0900 Gross per 24 hour  Intake 600 ml  Output --  Net 600 ml   2.  Acute on chronic systolic heart failure.  Echo completed on 03/06/2022 shows EF 45 to 50% with a mild to moderate MVR, TVR, and AVR.  Has received metolazone and furosemide during this admission.  Cardiology currently following.  3. Anemia of chronic kidney disease  Normocytic Lab Results   Component Value Date   HGB 11.6 (L) 07/26/2022  Hemoglobin within desired range for renal patient.  Will continue to monitor for now.  4.  Hypertension with chronic kidney disease.  Home regimen includes diltiazem, furosemide, metolazone, and metoprolol.  Currently receiving diltiazem and metoprolol only.    LOS: [place] 1/14/202411:11 AM

## 2022-07-26 NOTE — Progress Notes (Signed)
[facility] Cardiology    SUBJECTIVE: Patient was asleep resting comfortably without her CPAP machine on no pain denies shortness of breath no palpitations or tachycardia   Vitals:   07/26/22 0349 07/26/22 0400 07/26/22 0836 07/26/22 0837  BP:    100/62  Pulse:   100 100  Resp: '18 16 18 16  '$ Temp:    (!) 97.1 F (36.2 C)  TempSrc:      SpO2:   95% 95%  Weight:      Height:         Intake/Output Summary (Last 24 hours) at 07/26/2022 0913 Last data filed at 07/25/2022 1600 Gross per 24 hour  Intake 360 ml  Output --  Net 360 ml      PHYSICAL EXAM  General: Well developed, well nourished, in no acute distress HEENT:  Normocephalic and atramatic Neck:  No JVD.  Lungs: Clear bilaterally to auscultation and percussion. Heart: HRRR . Normal S1 and S2 without gallops or murmurs.  Abdomen: Bowel sounds are positive, abdomen soft and non-tender  Msk:  Back normal, normal gait. Normal strength and tone for age. Extremities: No clubbing, cyanosis or edema.   Neuro: Alert and oriented X 3. Psych:  Good affect, responds appropriately   LABS: Basic Metabolic Panel: Recent Labs    07/25/22 0549 07/26/22 0717  NA 136 134*  K 4.5 4.6  CL 92* 92*  CO2 29 29  GLUCOSE 72 84  BUN 64* 70*  CREATININE 2.63* 2.71*  CALCIUM 8.2* 7.9*  MG 2.5* 2.4   Liver Function Tests: No results for input(s): "AST", "ALT", "ALKPHOS", "BILITOT", "PROT", "ALBUMIN" in the last 72 hours. No results for input(s): "LIPASE", "AMYLASE" in the last 72 hours. CBC: Recent Labs    07/25/22 0549 07/26/22 0717  WBC 11.3* 12.8*  HGB 10.7* 11.6*  HCT 37.3 40.6  MCV 90.3 90.6  PLT 373 374   Cardiac Enzymes: No results for input(s): "CKTOTAL", "CKMB", "CKMBINDEX", "TROPONINI" in the last 72 hours. BNP: Invalid input(s): "POCBNP" D-Dimer: No results for input(s): "DDIMER" in the last 72 hours. Hemoglobin A1C: No results for input(s): "HGBA1C" in the last 72 hours. Fasting Lipid Panel: No results for  input(s): "CHOL", "HDL", "LDLCALC", "TRIG", "CHOLHDL", "LDLDIRECT" in the last 72 hours. Thyroid Function Tests: No results for input(s): "TSH", "T4TOTAL", "T3FREE", "THYROIDAB" in the last 72 hours.  Invalid input(s): "FREET3" Anemia Panel: No results for input(s): "VITAMINB12", "FOLATE", "FERRITIN", "TIBC", "IRON", "RETICCTPCT" in the last 72 hours.  US RENAL  Result Date: 07/25/2022 CLINICAL DATA:  [id number] AKI (acute kidney injury) [facility]) [id number] EXAM: RENAL / URINARY TRACT ULTRASOUND COMPLETE COMPARISON:  May 20, 2022 FINDINGS: Evaluation is limited by body habitus, suboptimal acoustic windows and bowel gas. Right Kidney: Renal measurements: 10.5 by 6.2 x 5.9 cm = volume: 198 mL. Echogenicity within normal limits. No suspicious mass or hydronephrosis visualized. Benign cyst of the superior pole measures 14 mm (for which no dedicated imaging follow-up is recommended). There is an additional 10 mm benign cyst of the inferior pole (for which no dedicated imaging follow-up is recommended). Left Kidney: Not visualized secondary to limitations described above. Bladder: Not visualized. Other: Small volume ascites is noted. Incidental note of a LEFT pleural effusion. IMPRESSION: 1. Evaluation is limited by body habitus, suboptimal acoustic windows and bowel gas. The left kidney and bladder were not visualized sonographically due to these limitations. 2. No evidence of RIGHT-sided hydronephrosis. 3. Incidental note of LEFT pleural effusion and small volume ascites. Electronically Signed  By: Valentino Saxon M.D.   On: 07/25/2022 11:57     Echo mildly reduced left ventricular function 45 to 50%  TELEMETRY: Sinus rhythm at 99 nonspecific ST-T wave changes:  ASSESSMENT AND PLAN:  Principal Problem:   Paroxysmal atrial fibrillation with RVR (HCC) Active Problems:   Type 2 diabetes mellitus with diabetic neuropathy, without long-term current use of insulin (HCC)   Hypothyroidism   HLD  (hyperlipidemia)   Elevated troponin   OSA (obstructive sleep apnea)   Acute respiratory failure with hypoxia (HCC)   Acute on chronic respiratory failure with hypoxia and hypercapnia (HCC)   Thoracic aortic aneurysm (HCC)   Hyperkalemia   Hypotension   Leukocytosis   Normocytic anemia   Acute pulmonary edema (HCC)   HAP (hospital-acquired pneumonia)   HFrEF (heart failure with reduced ejection fraction) (HCC)    Plan Paroxysmal atrial fibrillation converted to sinus rhythm rate reasonably controlled in the 90s continue current therapy Acute respiratory failure with hypoxemia improved continue CPAP/BiPAP supplemental oxygen and inhalers Diabetes type 2 continue current management and care Pneumonia probably hospital-acquired antibiotic therapy supplemental oxygen and inhaler therapy Obesity recommend modest weight loss exercise portion control Obstructive sleep apnea maintain CPAP or BiPAP when asleep Mild cardiomyopathy EF around 40 to 45% continue current therapy including diuretics to help with symptom management and control No invasive cardiac procedures recommended at this stage Anticoagulation for atrial fibrillation as well as rate control   Yolonda Kida, MD 07/26/2022 9:13 AM

## 2022-07-27 ENCOUNTER — Inpatient Hospital Stay (HOSPITAL_COMMUNITY)
Admit: 2022-07-27 | Discharge: 2022-07-27 | Disposition: A | Discharge status: Home / Self Care | Payer: Medicare Other | Attending: Student | Admitting: Student

## 2022-07-27 ENCOUNTER — Inpatient Hospital Stay: Payer: Medicare Other

## 2022-07-27 DIAGNOSIS — I480 Paroxysmal atrial fibrillation: Secondary | ICD-10-CM | POA: Diagnosis not present

## 2022-07-27 DIAGNOSIS — I5031 Acute diastolic (congestive) heart failure: Secondary | ICD-10-CM | POA: Diagnosis not present

## 2022-07-27 DIAGNOSIS — G4733 Obstructive sleep apnea (adult) (pediatric): Secondary | ICD-10-CM | POA: Diagnosis not present

## 2022-07-27 DIAGNOSIS — E1140 Type 2 diabetes mellitus with diabetic neuropathy, unspecified: Secondary | ICD-10-CM | POA: Diagnosis not present

## 2022-07-27 DIAGNOSIS — J9621 Acute and chronic respiratory failure with hypoxia: Secondary | ICD-10-CM | POA: Diagnosis not present

## 2022-07-27 LAB — RENAL FUNCTION PANEL
Albumin: 3.1 g/dL — ABNORMAL LOW (ref 3.5–5.0)
Anion gap: 13 (ref 5–15)
BUN: 69 mg/dL — ABNORMAL HIGH (ref 8–23)
CO2: 29 mmol/L (ref 22–32)
Calcium: 8.2 mg/dL — ABNORMAL LOW (ref 8.9–10.3)
Chloride: 93 mmol/L — ABNORMAL LOW (ref 98–111)
Creatinine, Ser: 2.06 mg/dL — ABNORMAL HIGH (ref 0.44–1.00)
GFR, Estimated: 26 mL/min — ABNORMAL LOW (ref >60)
Glucose, Bld: 69 mg/dL — ABNORMAL LOW (ref 70–99)
Phosphorus: 4.8 mg/dL — ABNORMAL HIGH (ref 2.5–4.6)
Potassium: 3.9 mmol/L (ref 3.5–5.1)
Sodium: 135 mmol/L (ref 135–145)

## 2022-07-27 LAB — BASIC METABOLIC PANEL
Anion gap: 13 (ref 5–15)
BUN: 69 mg/dL — ABNORMAL HIGH (ref 8–23)
CO2: 28 mmol/L (ref 22–32)
Calcium: 8.1 mg/dL — ABNORMAL LOW (ref 8.9–10.3)
Chloride: 92 mmol/L — ABNORMAL LOW (ref 98–111)
Creatinine, Ser: 2.16 mg/dL — ABNORMAL HIGH (ref 0.44–1.00)
GFR, Estimated: 24 mL/min — ABNORMAL LOW (ref >60)
Glucose, Bld: 71 mg/dL (ref 70–99)
Potassium: 3.8 mmol/L (ref 3.5–5.1)
Sodium: 133 mmol/L — ABNORMAL LOW (ref 135–145)

## 2022-07-27 LAB — CBC
HCT: 36.7 % (ref 36.0–46.0)
Hemoglobin: 10.7 g/dL — ABNORMAL LOW (ref 12.0–15.0)
MCH: 25.6 pg — ABNORMAL LOW (ref 26.0–34.0)
MCHC: 29.2 g/dL — ABNORMAL LOW (ref 30.0–36.0)
MCV: 87.8 fL (ref 80.0–100.0)
Platelets: 381 10*3/uL (ref 150–400)
RBC: 4.18 MIL/uL (ref 3.87–5.11)
RDW: 24.6 % — ABNORMAL HIGH (ref 11.5–15.5)
WBC: 8.7 10*3/uL (ref 4.0–10.5)
nRBC: 0.3 % — ABNORMAL HIGH (ref 0.0–0.2)

## 2022-07-27 LAB — ECHOCARDIOGRAM COMPLETE
Height: 65 in
S' Lateral: 5.2 cm
Weight: 4790.15 oz

## 2022-07-27 LAB — CORTISOL-AM, BLOOD: Cortisol - AM: 15 ug/dL (ref 6.7–22.6)

## 2022-07-27 LAB — BLOOD GAS, VENOUS
Acid-Base Excess: 7.4 mmol/L — ABNORMAL HIGH (ref 0.0–2.0)
Bicarbonate: 35.6 mmol/L — ABNORMAL HIGH (ref 20.0–28.0)
O2 Saturation: 66.9 %
Patient temperature: 37
pCO2, Ven: 66 mmHg — ABNORMAL HIGH (ref 44–60)
pH, Ven: 7.34 (ref 7.25–7.43)
pO2, Ven: 48 mmHg — ABNORMAL HIGH (ref 32–45)

## 2022-07-27 LAB — GLUCOSE, CAPILLARY
Glucose-Capillary: 88 mg/dL (ref 70–99)
Glucose-Capillary: 96 mg/dL (ref 70–99)

## 2022-07-27 LAB — MAGNESIUM: Magnesium: 2.5 mg/dL — ABNORMAL HIGH (ref 1.7–2.4)

## 2022-07-27 MED ORDER — ISOSORBIDE MONONITRATE ER 30 MG PO TB24
15.0000 mg | ORAL_TABLET | Freq: Two times a day (BID) | ORAL | Status: DC
  Administered 2022-07-27 – 2022-08-12 (×30): 15 mg via ORAL
  Filled 2022-07-27 (×32): qty 1

## 2022-07-27 MED ORDER — HYDRALAZINE HCL 10 MG PO TABS
10.0000 mg | ORAL_TABLET | Freq: Three times a day (TID) | ORAL | Status: DC
  Administered 2022-07-27 – 2022-07-31 (×7): 10 mg via ORAL
  Filled 2022-07-27 (×10): qty 1

## 2022-07-27 MED ORDER — LACTATED RINGERS IV SOLN: INTRAVENOUS | Status: DC

## 2022-07-27 MED ORDER — MIDODRINE HCL 5 MG PO TABS
5.0000 mg | ORAL_TABLET | Freq: Three times a day (TID) | ORAL | Status: DC
  Administered 2022-07-27 – 2022-07-31 (×10): 5 mg via ORAL
  Filled 2022-07-27 (×10): qty 1

## 2022-07-27 MED ORDER — TORSEMIDE 20 MG PO TABS
20.0000 mg | ORAL_TABLET | Freq: Every day | ORAL | Status: DC
  Administered 2022-07-27 – 2022-07-29 (×3): 20 mg via ORAL
  Filled 2022-07-27 (×3): qty 1

## 2022-07-27 MED ORDER — ROPINIROLE HCL 1 MG PO TABS
1.0000 mg | ORAL_TABLET | Freq: Every day | ORAL | Status: DC
  Administered 2022-07-27 – 2022-08-11 (×15): 1 mg via ORAL
  Filled 2022-07-27 (×16): qty 1

## 2022-07-27 NOTE — Progress Notes (Addendum)
Notified by patient's RN about "the family is concerned because she keeps "dropping her arms" and they reported weakness in the right arm. I found both arms and legs to be equal in strength just now. She is back on the bipoap and it really appears to be gas related - like she is hallucinating and is super drowsy.".  When I arrived at room, patient is on BiPAP and awake.  She is oriented to self, person and place but not time.  She denies headache, vision change, difficulty speaking, focal numbness or weakness.  Patient's son at bedside.    Objective GENERAL: No apparent distress.  On BiPAP. HEENT: MMM.  Vision and hearing grossly intact.  NECK: Supple.  No apparent JVD.  RESP:  No IWOB.  Fair aeration bilaterally. CVS:  RRR. Heart sounds normal.  ABD/GI/GU: BS+. Abd soft, NTND.  MSK/EXT:  Moves extremities. No apparent deformity. No edema.  SKIN: no apparent skin lesion or wound NEURO: Awake and alert. Oriented to self, place and person.  Follows commands.  Symmetric strength.  No facial asymmetry.  No apparent focal neuro deficit. PSYCH: Calm. Normal affect.   Discussion We have discussed about patient's current condition including neuroexam as above, new finding on echocardiogram with reduced ejection fraction and AKI.  Also broached on CODE STATUS including pros and cons of CPR in light of advanced disease process.  Patient's son understand that CPR is "brutal" but defers decision to patient's husband who is patient's H POA.  He also called and talked to patient's husband who would prefer to continue full code for now.  Palliative medicine consulted.  Plan Decrease Requip from 3 mg to 1 mg nightly. Cardiology aware of new echo finding with significantly reduced ejection fraction. VBG with hypercapnia to 66 and normal pH suggesting chronic respiratory acidosis. Continue BiPAP Consult palliative medicine

## 2022-07-27 NOTE — Progress Notes (Signed)
*  PRELIMINARY RESULTS* Echocardiogram 2D Echocardiogram has been performed.  Madison Mcbride 07/27/2022, 10:40 AM

## 2022-07-27 NOTE — Progress Notes (Addendum)
[facility]  ROUNDING NOTE   Subjective:   Madison Mcbride with a past medical history of chronic diastolic heart failure, morbid obesity, hypothyroidism, diabetes mellitus type 2, obstructive sleep apnea, lymphedema, and chronic kidney disease stage IIIa.  Patient presents to the emergency department with complaints of shortness of breath.  She has been admitted for Acute pulmonary edema (HCC) [J81.0] Atrial fibrillation with RVR (HCC) [I48.91] HCAP (healthcare-associated pneumonia) [J18.9] Acute on chronic respiratory failure with hypoxia and hypercapnia ([place]) [W11.91, J96.22]  Patient is known to our practice and is seen outpatient by Dr. Holley Raring.  Patient was last seen in our office on April 03, 2021.  Patient has been lost to follow-up since that time.    Patient seen resting in bed Alert, more responsive today Myoclonic jerks absent Room air   Objective:  Vital signs in last 24 hours:  Temp:  [97.6 F (36.4 C)-98.7 F (37.1 C)] 98.7 F (37.1 C) (01/15 0348) Pulse Rate:  [69-107] 107 (01/15 0348) Resp:  [15-20] 19 (01/15 0408) BP: (92-117)/(61-86) 102/77 (01/15 0348) SpO2:  [92 %-100 %] 95 % (01/15 0806) FiO2 (%):  [30 %] 30 % (01/14 2304)  Weight change:  Filed Weights   07/21/22 0500 07/22/22 0321 07/23/22 0500  Weight: (!) 136.6 kg (!) 140 kg 135.8 kg    Intake/Output: I/O last 3 completed shifts: In: 480 [P.O.:480] Out: 550 [Urine:550]   Intake/Output this shift:  No intake/output data recorded.  Physical Exam: General: NAD  Head: Normocephalic, atraumatic. Dry oral mucosal membranes  Eyes: Anicteric  Lungs:  Clear to auscultation, normal effort, room air  Heart: Regular rate and rhythm  Abdomen:  Soft, nontender, obese  Extremities: Trace to 1+ peripheral edema.  Neurologic: Alert, myoclonic jerking present  Skin: No lesions  Access: None    Basic Metabolic Panel: Recent Labs  Lab 07/24/22 0551 07/25/22 0549 07/26/22 0717  07/27/22 0505 07/27/22 0506  NA 136 136 134* 133* 135  K 4.1 4.5 4.6 3.8 3.9  CL 94* 92* 92* 92* 93*  CO2 '30 29 29 28 29  '$ GLUCOSE 89 72 84 71 69*  BUN 58* 64* 70* 69* 69*  CREATININE 2.26* 2.63* 2.71* 2.16* 2.06*  CALCIUM 8.2* 8.2* 7.9* 8.1* 8.2*  MG  --  2.5* 2.4 2.5*  --   PHOS  --   --   --   --  4.8*     Liver Function Tests: Recent Labs  Lab 07/21/22 0520 07/22/22 0541 07/23/22 0324 07/27/22 0506  AST 118* 80* 179*  --   ALT 132* 98* 134*  --   ALKPHOS 127* 132* 137*  --   BILITOT 1.4* 1.2 1.8*  --   PROT 7.5 6.9 6.7  --   ALBUMIN 3.4* 3.1* 3.0* 3.1*    No results for input(s): "LIPASE", "AMYLASE" in the last 168 hours. No results for input(s): "AMMONIA" in the last 168 hours.  CBC: Recent Labs  Lab 07/23/22 0324 07/24/22 0551 07/25/22 0549 07/26/22 0717 07/27/22 0505  WBC 11.6* 11.0* 11.3* 12.8* 8.7  HGB 10.7* 10.5* 10.7* 11.6* 10.7*  HCT 37.5 37.1 37.3 40.6 36.7  MCV 90.6 91.4 90.3 90.6 87.8  PLT 334 359 373 374 381     Cardiac Enzymes: No results for input(s): "CKTOTAL", "CKMB", "CKMBINDEX", "TROPONINI" in the last 168 hours.  BNP: Invalid input(s): "POCBNP"  CBG: Recent Labs  Lab 07/23/22 2142 07/24/22 0008 07/24/22 0257 07/24/22 0332 07/24/22 0538  GLUCAP 87 71 62* 71 94  Microbiology: Results for orders placed or performed during the hospital encounter of 07/21/2022  Blood culture (routine x 2)     Status: None   Collection Time: 07/22/2022 11:01 PM   Specimen: BLOOD  Result Value Ref Range Status   Specimen Description BLOOD BLOOD RIGHT WRIST  Final   Special Requests   Final    BOTTLES DRAWN AEROBIC AND ANAEROBIC Blood Culture results may not be optimal due to an inadequate volume of blood received in culture bottles   Culture   Final    NO GROWTH 5 DAYS Performed at [facility], [address] [place], [place] [address]    Report Status 07/22/2022 FINAL  Final  Blood culture (routine x 2)     Status: None    Collection Time: 07/19/2022 11:01 PM   Specimen: BLOOD  Result Value Ref Range Status   Specimen Description BLOOD BLOOD LEFT HAND  Final   Special Requests IN PEDIATRIC BOTTLE Blood Culture adequate volume  Final   Culture   Final    NO GROWTH 5 DAYS Performed at [facility], [address] [place], [place] [address]    Report Status 07/22/2022 FINAL  Final  MRSA Next Gen by PCR, Nasal     Status: None   Collection Time: 07/18/22 10:11 AM   Specimen: Nasal Mucosa; Nasal Swab  Result Value Ref Range Status   MRSA by PCR Next Gen NOT DETECTED NOT DETECTED Final    Comment: (NOTE) The GeneXpert MRSA Assay (FDA approved for NASAL specimens only), is one component of a comprehensive MRSA colonization surveillance program. It is not intended to diagnose MRSA infection nor to guide or monitor treatment for MRSA infections. Test performance is not FDA approved in patients less than 33 years old. Performed at [facility], [place], [place] [address]   Respiratory (~20 pathogens) panel by PCR     Status: None   Collection Time: 07/18/22 11:07 AM   Specimen: Nasopharyngeal Swab; Respiratory  Result Value Ref Range Status   Adenovirus NOT DETECTED NOT DETECTED Final   Coronavirus 229E NOT DETECTED NOT DETECTED Final    Comment: (NOTE) The Coronavirus on the Respiratory Panel, DOES NOT test for the novel  Coronavirus (2019 nCoV)    Coronavirus HKU1 NOT DETECTED NOT DETECTED Final   Coronavirus NL63 NOT DETECTED NOT DETECTED Final   Coronavirus OC43 NOT DETECTED NOT DETECTED Final   Metapneumovirus NOT DETECTED NOT DETECTED Final   Rhinovirus / Enterovirus NOT DETECTED NOT DETECTED Final   Influenza A NOT DETECTED NOT DETECTED Final   Influenza B NOT DETECTED NOT DETECTED Final   Parainfluenza Virus 1 NOT DETECTED NOT DETECTED Final   Parainfluenza Virus 2 NOT DETECTED NOT DETECTED Final   Parainfluenza Virus 3 NOT DETECTED NOT DETECTED Final    Parainfluenza Virus 4 NOT DETECTED NOT DETECTED Final   Respiratory Syncytial Virus NOT DETECTED NOT DETECTED Final   Bordetella pertussis NOT DETECTED NOT DETECTED Final   Bordetella Parapertussis NOT DETECTED NOT DETECTED Final   Chlamydophila pneumoniae NOT DETECTED NOT DETECTED Final   Mycoplasma pneumoniae NOT DETECTED NOT DETECTED Final    Comment: Performed at [facility], [place]. [address] [place], [place] [address]  Urine Culture     Status: None   Collection Time: 07/23/22  6:00 AM   Specimen: Urine, Clean Catch  Result Value Ref Range Status   Specimen Description   Final    URINE, CLEAN CATCH Performed  at [facility], [address] [place], [place] [address]    Special Requests   Final    Normal Performed at [facility], [place]., [place],  [address]    Culture   Final    NO GROWTH Performed at [facility], [place] [address], [place],  [address]    Report Status 07/24/2022 FINAL  Final    Coagulation Studies: No results for input(s): "LABPROT", "INR" in the last 72 hours.  Urinalysis: No results for input(s): "COLORURINE", "LABSPEC", "PHURINE", "GLUCOSEU", "HGBUR", "BILIRUBINUR", "KETONESUR", "PROTEINUR", "UROBILINOGEN", "NITRITE", "LEUKOCYTESUR" in the last 72 hours.  Invalid input(s): "APPERANCEUR"    Imaging: US RENAL  Result Date: 07/25/2022 CLINICAL DATA:  [id number] AKI (acute kidney injury) ([place]) [id number] EXAM: RENAL / URINARY TRACT ULTRASOUND COMPLETE COMPARISON:  May 20, 2022 FINDINGS: Evaluation is limited by body habitus, suboptimal acoustic windows and bowel gas. Right Kidney: Renal measurements: 10.5 by 6.2 x 5.9 cm = volume: 198 mL. Echogenicity within normal limits. No suspicious mass or hydronephrosis visualized. Benign cyst of the superior pole measures 14 mm (for which no dedicated imaging follow-up is recommended). There is an additional 10 mm benign cyst of the inferior pole (for which no  dedicated imaging follow-up is recommended). Left Kidney: Not visualized secondary to limitations described above. Bladder: Not visualized. Other: Small volume ascites is noted. Incidental note of a LEFT pleural effusion. IMPRESSION: 1. Evaluation is limited by body habitus, suboptimal acoustic windows and bowel gas. The left kidney and bladder were not visualized sonographically due to these limitations. 2. No evidence of RIGHT-sided hydronephrosis. 3. Incidental note of LEFT pleural effusion and small volume ascites. Electronically Signed   By: Valentino Saxon M.D.   On: 07/25/2022 11:57     Medications:     apixaban  5 mg Oral BID   atorvastatin  20 mg Oral Daily   budesonide (PULMICORT) nebulizer solution  0.25 mg Nebulization BID   buPROPion  150 mg Oral Daily   diltiazem  120 mg Oral Daily   ipratropium-albuterol  3 mL Nebulization TID   levothyroxine  250 mcg Oral Q0600   metoprolol tartrate  50 mg Oral TID   pantoprazole  40 mg Oral Daily   rOPINIRole  3 mg Oral QHS   acetaminophen, guaiFENesin, levalbuterol, liver oil-zinc oxide, metoprolol tartrate, ondansetron (ZOFRAN) IV, oxyCODONE, polyethylene glycol, senna-docusate  Assessment/ Plan:  Ms. Madison Mcbride is a 67 y.o.  female  past medical history of chronic diastolic heart failure, morbid obesity, hypothyroidism, diabetes mellitus type 2, obstructive sleep apnea, lymphedema, and chronic kidney disease stage IIIa.  Patient presents to the emergency department with complaints of shortness of breath.  She has been admitted for Acute pulmonary edema (HCC) [J81.0] Atrial fibrillation with RVR (HCC) [I48.91] HCAP (healthcare-associated pneumonia) [J18.9] Acute on chronic respiratory failure with hypoxia and hypercapnia (HCC) [I77.82, J96.22]   Acute Kidney Injury on chronic kidney disease stage IIIa with baseline creatinine 1.35 and GFR of 54 on 07/16/22.  Acute kidney injury appears multifactorial due to IV contrast  exposure and aggressive diuresis.  IV contrast exposure noted on 07/19/2022.  Renal ultrasound negative for obstruction.  Diuretics remain held. Renal function has improved. Will order gentle hydration, LR 66m/hr for 1 day. Will continue to monitor renal function. Continue to avoid nephrotoxic agents and therapies.    Lab Results  Component Value Date   CREATININE 2.06 (H) 07/27/2022   CREATININE 2.16 (H) 07/27/2022   CREATININE 2.71 (H)  07/26/2022    Intake/Output Summary (Last 24 hours) at 07/27/2022 1006 Last data filed at 07/27/2022 0351 Gross per 24 hour  Intake 240 ml  Output 550 ml  Net -310 ml    2.  Acute on chronic systolic heart failure.  Echo completed on 03/06/2022 shows EF 45 to 50% with a mild to moderate MVR, TVR, and AVR.  Has received metolazone and furosemide during this admission.  Cardiology following  3. Anemia of chronic kidney disease  Normocytic Lab Results  Component Value Date   HGB 10.7 (L) 07/27/2022  Hemoglobin acceptable. Will continue to monitor.  4.  Hypertension with chronic kidney disease.  Home regimen includes diltiazem, furosemide, metolazone, and metoprolol.  Currently receiving diltiazem and metoprolol only. Bloop pressure 102/77 today    LOS: 10   1/15/202410:06 AM

## 2022-07-27 NOTE — Progress Notes (Signed)
Per night nurse, pt only wore her CPAP starting at 0400 last night.

## 2022-07-27 NOTE — TOC Progression Note (Signed)
Transition of Care [facility]) - Progression Note    Patient Details  Name: Dashay Giesler MRN: [id number] Date of Birth: 23-May-1956  Transition of Care [facility]) CM/SW [place], [place] Phone Number: 07/27/2022, 7:43 AM  Clinical Narrative:     TOC continues to follow for patient's medical readiness to return to [facility].  Expected Discharge Plan: [place] Barriers to Discharge: Continued Medical Work up  Expected Discharge Plan and Services   Discharge Planning Services: CM Consult Post Acute Care Choice: Resumption of Svcs/PTA Provider Living arrangements for the past 2 months: [place]                 DME Arranged: N/A DME Agency: NA       HH Arranged: NA [place] Agency: NA         Social Determinants of Health (SDOH) Interventions [place]: No Food Insecurity (07/18/2022)  Housing: Low Risk  (07/18/2022)  Transportation Needs: No Transportation Needs (07/18/2022)  Utilities: Not At Risk (07/18/2022)  Tobacco Use: Low Risk  (07/29/2022)    Readmission Risk Interventions    07/02/2022   12:43 PM 03/07/2022    4:12 PM  Readmission Risk Prevention Plan  Transportation Screening Complete Complete  PCP or Specialist Appt within 5-7 Days  Complete  Home Care Screening  Complete  Medication Review (RN CM)  Complete  Medication Review (RN Care Manager) Complete   PCP or Specialist appointment within 3-5 days of discharge Complete   SW Recovery Care/Counseling Consult Complete   Skilled Nursing Facility Complete

## 2022-07-27 NOTE — Progress Notes (Signed)
[facility] Cardiology    SUBJECTIVE: Patient states this morning breathing was somewhat better she is resting comfortably no pain no palpitations or tachycardia no fever chills or sweats   Vitals:   07/27/22 1415 07/27/22 1423 07/27/22 1553 07/27/22 1557  BP:    (!) 117/99  Pulse:  92 (!) 53 88  Resp:  20  20  Temp:      TempSrc:      SpO2: 94%  94% 96%  Weight:      Height:         Intake/Output Summary (Last 24 hours) at 07/27/2022 1616 Last data filed at 07/27/2022 0351 Gross per 24 hour  Intake --  Output 550 ml  Net -550 ml      PHYSICAL EXAM  General: Well developed, well nourished, in no acute distress HEENT:  Normocephalic and atramatic Neck:  No JVD.  Lungs: Clear bilaterally to auscultation and percussion. Heart: HRRR . Normal S1 and S2 without gallops or murmurs.  Abdomen: Bowel sounds are positive, abdomen soft and non-tender  Msk:  Back normal, normal gait. Normal strength and tone for age. Extremities: No clubbing, cyanosis or edema.   Neuro: Alert and oriented X 3. Psych:  Good affect, responds appropriately   LABS: Basic Metabolic Panel: Recent Labs    07/26/22 0717 07/27/22 0505 07/27/22 0506  NA 134* 133* 135  K 4.6 3.8 3.9  CL 92* 92* 93*  CO2 '29 28 29  '$ GLUCOSE 84 71 69*  BUN 70* 69* 69*  CREATININE 2.71* 2.16* 2.06*  CALCIUM 7.9* 8.1* 8.2*  MG 2.4 2.5*  --   PHOS  --   --  4.8*   Liver Function Tests: Recent Labs    07/27/22 0506  ALBUMIN 3.1*   No results for input(s): "LIPASE", "AMYLASE" in the last 72 hours. CBC: Recent Labs    07/26/22 0717 07/27/22 0505  WBC 12.8* 8.7  HGB 11.6* 10.7*  HCT 40.6 36.7  MCV 90.6 87.8  PLT 374 381   Cardiac Enzymes: No results for input(s): "CKTOTAL", "CKMB", "CKMBINDEX", "TROPONINI" in the last 72 hours. BNP: Invalid input(s): "POCBNP" D-Dimer: No results for input(s): "DDIMER" in the last 72 hours. Hemoglobin A1C: No results for input(s): "HGBA1C" in the last 72 hours. Fasting Lipid  Panel: No results for input(s): "CHOL", "HDL", "LDLCALC", "TRIG", "CHOLHDL", "LDLDIRECT" in the last 72 hours. Thyroid Function Tests: No results for input(s): "TSH", "T4TOTAL", "T3FREE", "THYROIDAB" in the last 72 hours.  Invalid input(s): "FREET3" Anemia Panel: No results for input(s): "VITAMINB12", "FOLATE", "FERRITIN", "TIBC", "IRON", "RETICCTPCT" in the last 72 hours.  ECHOCARDIOGRAM COMPLETE  Result Date: 07/27/2022    ECHOCARDIOGRAM REPORT   Patient Name:   Madison Mcbride Date of Exam: 07/27/2022 Medical Rec #:  [id number]             Height:       65.0 in Accession #:    [id number]            Weight:       299.4 lb Date of Birth:  Jan 02, 1956             BSA:          2.348 m Patient Age:    67 years              BP:           100/62 mmHg Patient Gender: F  HR:           100 bpm. Exam Location:  [facility] Procedure: 2D Echo, Cardiac Doppler and Color Doppler Indications:     CHF-acute diastolic T51.76  History:         Patient has prior history of Echocardiogram examinations, most                  recent 03/06/2022. CHF; Risk Factors:Sleep Apnea and Diabetes.  Sonographer:     Sherrie Sport Referring Phys:  [id number] Charlesetta Ivory GONFA Diagnosing Phys: Kathlyn Sacramento MD  Sonographer Comments: Technically challenging study due to limited acoustic windows and no apical window. IMPRESSIONS  1. Left ventricular ejection fraction, by estimation, is 25 to 30%. The left ventricle has severely decreased function. Left ventricular endocardial border not optimally defined to evaluate regional wall motion. The left ventricular internal cavity size  was mildly dilated. There is mild left ventricular hypertrophy. Left ventricular diastolic parameters are indeterminate.  2. Right ventricular systolic function is moderately reduced. The right ventricular size is normal. There is moderately elevated pulmonary artery systolic pressure. The estimated right ventricular systolic pressure is 06.2 mmHg.  3.  Moderate pleural effusion in the left lateral region.  4. The mitral valve is normal in structure. No evidence of mitral valve regurgitation. No evidence of mitral stenosis.  5. The aortic valve is normal in structure. Aortic valve regurgitation is moderate. Aortic valve sclerosis/calcification is present, without any evidence of aortic stenosis.  6. Aortic dilatation noted. 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  7. Moderately dilated pulmonary artery. FINDINGS  Left Ventricle: Left ventricular ejection fraction, by estimation, is 25 to 30%. The left ventricle has severely decreased function. Left ventricular endocardial border not optimally defined to evaluate regional wall motion. The left ventricular internal cavity size was mildly dilated. There is mild left ventricular hypertrophy. Left ventricular diastolic parameters are indeterminate. Right Ventricle: The right ventricular size is normal. No increase in right ventricular wall thickness. Right ventricular systolic function is moderately reduced. There is moderately elevated pulmonary artery systolic pressure. The tricuspid regurgitant velocity is 3.19 m/s, and with an assumed right atrial pressure of 10 mmHg, the estimated right ventricular systolic pressure is 69.4 mmHg. Left Atrium: Left atrial size was normal in size. Right Atrium: Right atrial size was normal in size. Pericardium: There is no evidence of pericardial effusion. Mitral Valve: The mitral valve is normal in structure. No evidence of mitral valve regurgitation. No evidence of mitral valve stenosis. Tricuspid Valve: The tricuspid valve is normal in structure. Tricuspid valve regurgitation is mild . No evidence of tricuspid stenosis. Aortic Valve: The aortic valve is normal in structure. Aortic valve regurgitation is moderate. Aortic valve sclerosis/calcification is present, without any evidence of aortic stenosis. Pulmonic Valve: The pulmonic  valve was normal in structure. Pulmonic valve regurgitation is mild. No evidence of pulmonic stenosis. Aorta: Aortic dilatation noted. 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 Pulmonary Artery: The pulmonary artery is moderately dilated. Venous: The inferior vena cava was not well visualized. IAS/Shunts: No atrial level shunt detected by color flow Doppler. Additional Comments: There is a moderate pleural effusion in the left lateral region.  LEFT VENTRICLE PLAX 2D LVIDd:         5.80 cm LVIDs:         5.20 cm LV PW:         0.80  cm LV IVS:        1.20 cm LVOT diam:     2.20 cm LVOT Area:     3.80 cm  LEFT ATRIUM         Index LA diam:    2.60 cm 1.11 cm/m   AORTA Ao Root diam: 5.40 cm TRICUSPID VALVE TR Peak grad:   40.7 mmHg TR Vmax:        319.00 cm/s  SHUNTS Systemic Diam: 2.20 cm Kathlyn Sacramento MD Electronically signed by Kathlyn Sacramento MD Signature Date/Time: 07/27/2022/1:22:54 PM    Final      Echo failure depressed left ventricular function EF around 25% globally depressed  TELEMETRY: Sinus rhythm at 85 nonspecific ST-T wave changes:  ASSESSMENT AND PLAN:  Principal Problem:   Paroxysmal atrial fibrillation with RVR (HCC) Active Problems:   Type 2 diabetes mellitus with diabetic neuropathy, without long-term current use of insulin (HCC)   Hypothyroidism   HLD (hyperlipidemia)   Elevated troponin   OSA (obstructive sleep apnea)   Acute respiratory failure with hypoxia (HCC)   Acute on chronic respiratory failure with hypoxia and hypercapnia (HCC)   Thoracic aortic aneurysm (HCC)   Hyperkalemia   Hypotension   Leukocytosis   Normocytic anemia   Acute pulmonary edema (HCC)   HAP (hospital-acquired pneumonia)   HFrEF (heart failure with reduced ejection fraction) (HCC)   Plan Shortness of breath dyspnea continue supplemental inhalers and supplemental oxygen as necessary Obstructive sleep apnea continue CPAP BiPAP while  sleeping Atrial fibrillation converted to sinus rhythm tachycardia controlled on metoprolol and Cardizem Relative hypotension we will start midodrine therapy to help with blood pressure support Hospital-acquired pneumonia broad-spectrum antibiotic therapy patient appears to be improving Recommend increase activity physical therapy to help with movement and strength Diabetes type 2 continue diabetes management and control Systolic cardiomyopathy reduced since previous echo EF around 25% recommend continue advancing heart failure therapy medications Continue diuretic therapy for heart [facility] Cardiology

## 2022-07-27 NOTE — Progress Notes (Signed)
I took care of pt on Friday and pt appears to be the same, no worse than Friday - still hallucinating, still SOB at rest. Updated son who is at bedside.   Son expresses desire to get updates from both specialists in order to update his father and better understand where his mom's condition is at. Notified both cardiology and nephrology.

## 2022-07-27 NOTE — Progress Notes (Signed)
PT Cancellation Note  Patient Details Name: Madison Mcbride MRN: [id number] DOB: 03/05/1956   Cancelled Treatment:     Therapist in this afternoon for continued PT. Pt resting in bed, remains confused, only oriented to self and husband at bedside. Pt currently on BiPap with radiology entering for Chest x-ray. Will attempt PT next available date/time per POC.   Josie Dixon 07/27/2022, 5:11 PM

## 2022-07-27 NOTE — Progress Notes (Signed)
PROGRESS NOTE  Madison Mcbride ZOX:[id number] DOB: 09-14-1955   PCP: Rusty Aus, MD  Patient is from: SNF  DOA: 07/13/2022 LOS: 90  Chief complaints Chief Complaint  Patient presents with   Shortness of Breath     Brief Narrative / Interim history: 67 year old F with PMH of A-fib on Eliquis, diastolic CHF, DM-2, HTN, HLD, hypothyroidism, thyroid cancer, anxiety, depression, morbid obesity, chronic back pain, OSA on CPAP and CKD-3A who was sent back by SNF on the day of discharge due to tachycardia on intake.  Pt husband states that she appeared diaphoretic and slightly dyspneic, and somnolent.  Upon admission initially placed on BiPAP for acute on chronic hypoxic and hypercapnic respiratory failure and started on vancomycin, cefepime and Flagyl for possible hospital-acquired pneumonia.  Patient remained hypotensive despite of IV fluids therefore required Levophed and was monitored in the ICU.  Cardiology team was consulted.  There were signs of volume overload and increasing work of breathing on presentation and she was started on IV Lasix.  She also had some metolazone in addition to IV contrast with CT chest to rule out PE.  As a result, she developed AKI.  Nephrology consulted and following.  Subjective: Seen and examined earlier this morning.  No major events overnight of this morning.  No complaints.  Feels better.  She denies chest pain, shortness of breath, nausea or vomiting.  Creatinine improved.  Getting echocardiogram done.  Objective: Vitals:   07/27/22 0500 07/27/22 0600 07/27/22 0806 07/27/22 1033  BP:    103/71  Pulse:    (!) 105  Resp:    18  Temp:    98.6 F (37 C)  TempSrc:    Oral  SpO2: 100% 95% 95% 96%  Weight:      Height:        Examination:  GENERAL: No apparent distress.  Nontoxic. HEENT: MMM.  Vision and hearing grossly intact.  NECK: Supple.  No apparent JVD.  RESP:  No IWOB.  Fair aeration bilaterally but limited exam due to body  habitus. CVS:  RRR. Heart sounds normal.  ABD/GI/GU: BS+. Abd soft, NTND.  MSK/EXT:  Moves extremities. No apparent deformity. No edema.  SKIN: Mild stasis dermatitis. NEURO: Awake and alert. Oriented appropriately.  No apparent focal neuro deficit. PSYCH: Calm. Normal affect.    Microbiology summarized: RVP and MRSA PCR screen nonreactive. Blood cultures NGTD  Assessment and plan: Principal Problem:   Paroxysmal atrial fibrillation with RVR (HCC) Active Problems:   Acute on chronic respiratory failure with hypoxia and hypercapnia (HCC)   HLD (hyperlipidemia)   OSA (obstructive sleep apnea)   Type 2 diabetes mellitus with diabetic neuropathy, without long-term current use of insulin (HCC)   Elevated troponin   Hypothyroidism   Acute respiratory failure with hypoxia (HCC)   Thoracic aortic aneurysm (HCC)   Hyperkalemia   Hypotension   Leukocytosis   Normocytic anemia   Acute pulmonary edema (HCC)   HAP (hospital-acquired pneumonia)   HFrEF (heart failure with reduced ejection fraction) (HCC)  Acute respiratory failure with hypoxia and hypercapnia-due to OSA/OHS, and possible hospital-acquired pneumonia and and fluid overload.  Pro-Cal 0.5.  Initial BNP 2300.  -Zosyn from 1/6 - 1/9> IV Unasyn> Augmentin>> 1/14.  -Continue bronchodilators, incentive spirometry -Minimize oxygen use   Afib with RVR: HR in the range of 100-110. -Cardiology following -Continue metoprolol 50 mg 3 times daily and Cardizem CD120 mg daily -IV metoprolol 5 mg every 6 hours as needed -Continue anticoagulation -Follow  echocardiogram   AKI on CKD-3B: B/l Cr seems to be about 1.2.  Renal US suboptimal due to body habitus and bowel gas.  Improved today. Recent Labs    07/19/22 0541 07/20/22 0246 07/21/22 0520 07/22/22 0541 07/23/22 0324 07/24/22 0551 07/25/22 0549 07/26/22 0717 07/27/22 0505 07/27/22 0506  BUN 62* 50* 41* 40* 46* 58* 64* 70* 69* 69*  CREATININE 1.59* 1.41* 1.22* 1.26* 1.67*  2.26* 2.63* 2.71* 2.16* 2.06*  -Appreciate input by nephrology. -Follow echocardiogram.  HFmrEF: TTE in 02/2022 with LVEF of 45 to 50% and normal DD.  Appears euvolemic on exam but difficult due to body habitus.  BNP markedly elevated likely indicating poor prognosis although in the setting of renal failure. -Update echocardiogram -Cardiology and nephrology following.  Controlled DM-2 with CKD-3B and hypoglycemia: A1c 5.3% in 06/2022.  Has borderline low CBGs. -Start CBG monitoring every 8 hours. -Check cortisol level  Restless leg syndrome -Continue home Requip   Hypothyroidism  -Continue levothyroxine   Chronic venous stasis dermatitis of both lower extremities  Anxiety/depression/chronic pain: Stable -Continue home meds.   OSA/OHS -BiPAP at night and when she sleeps   Physical deconditioning: Patient is from SNF. -PT/OT  Morbid obesity: Could be contributing to physical deconditioning and respiratory failure. Body mass index is 49.82 kg/m.           DVT prophylaxis:   apixaban (ELIQUIS) tablet 5 mg  Code Status: Full code Family Communication: None at bedside Level of care: Telemetry Cardiac Status is: Inpatient Remains inpatient appropriate because: AKI   Final disposition: SNF Consultants:  Pulmonology Cardiology Nephrology  35 minutes with more than 50% spent in reviewing records, counseling patient/family and coordinating care.   Sch Meds:  Scheduled Meds:  apixaban  5 mg Oral BID   atorvastatin  20 mg Oral Daily   budesonide (PULMICORT) nebulizer solution  0.25 mg Nebulization BID   buPROPion  150 mg Oral Daily   diltiazem  120 mg Oral Daily   ipratropium-albuterol  3 mL Nebulization TID   levothyroxine  250 mcg Oral Q0600   metoprolol tartrate  50 mg Oral TID   pantoprazole  40 mg Oral Daily   rOPINIRole  3 mg Oral QHS   Continuous Infusions:  lactated ringers     PRN Meds:.acetaminophen, guaiFENesin, levalbuterol, liver oil-zinc oxide,  metoprolol tartrate, ondansetron (ZOFRAN) IV, oxyCODONE, polyethylene glycol, senna-docusate  Antimicrobials: Anti-infectives (From admission, onward)    Start     Dose/Rate Route Frequency Ordered Stop   07/25/22 1200  amoxicillin-clavulanate (AUGMENTIN) 500-125 MG per tablet 1 tablet        1 tablet Oral 2 times daily 07/25/22 1102 07/26/22 2117   07/24/22 1400  Ampicillin-Sulbactam (UNASYN) 3 g in sodium chloride 0.9 % 100 mL IVPB  Status:  Discontinued        3 g 200 mL/hr over 30 Minutes Intravenous Every 6 hours 07/24/22 1228 07/24/22 1231   07/24/22 1330  amoxicillin-clavulanate (AUGMENTIN) 875-125 MG per tablet 1 tablet  Status:  Discontinued        1 tablet Oral Every 12 hours 07/24/22 1231 07/25/22 1102   07/21/22 1500  Ampicillin-Sulbactam (UNASYN) 3 g in sodium chloride 0.9 % 100 mL IVPB        3 g 200 mL/hr over 30 Minutes Intravenous Every 6 hours 07/21/22 1357 07/22/22 2216   07/18/22 1900  azithromycin (ZITHROMAX) 500 mg in sodium chloride 0.9 % 250 mL IVPB  Status:  Discontinued  500 mg 250 mL/hr over 60 Minutes Intravenous Every 24 hours 07/18/22 0205 07/19/22 1204   07/18/22 1000  piperacillin-tazobactam (ZOSYN) IVPB 3.375 g  Status:  Discontinued        3.375 g 12.5 mL/hr over 240 Minutes Intravenous Every 8 hours 07/18/22 0842 07/21/22 1357   07/18/22 0800  cefTRIAXone (ROCEPHIN) 1 g in sodium chloride 0.9 % 100 mL IVPB  Status:  Discontinued        1 g 200 mL/hr over 30 Minutes Intravenous Every 24 hours 07/18/22 0205 07/18/22 0829   07/29/2022 1915  vancomycin (VANCOREADY) IVPB 2000 mg/400 mL        2,000 mg 200 mL/hr over 120 Minutes Intravenous  Once 07/27/2022 1914 08/09/2022 2250   08/07/2022 1900  ceFEPIme (MAXIPIME) 2 g in sodium chloride 0.9 % 100 mL IVPB        2 g 200 mL/hr over 30 Minutes Intravenous  Once 08/05/2022 1853 07/18/22 1900   07/15/2022 1900  azithromycin (ZITHROMAX) 500 mg in sodium chloride 0.9 % 250 mL IVPB        500 mg 250 mL/hr over 60  Minutes Intravenous  Once 07/24/2022 1853 07/18/22 1900        I have personally reviewed the following labs and images: CBC: Recent Labs  Lab 07/23/22 0324 07/24/22 0551 07/25/22 0549 07/26/22 0717 07/27/22 0505  WBC 11.6* 11.0* 11.3* 12.8* 8.7  HGB 10.7* 10.5* 10.7* 11.6* 10.7*  HCT 37.5 37.1 37.3 40.6 36.7  MCV 90.6 91.4 90.3 90.6 87.8  PLT 334 359 373 374 381   BMP &GFR Recent Labs  Lab 07/24/22 0551 07/25/22 0549 07/26/22 0717 07/27/22 0505 07/27/22 0506  NA 136 136 134* 133* 135  K 4.1 4.5 4.6 3.8 3.9  CL 94* 92* 92* 92* 93*  CO2 '30 29 29 28 29  '$ GLUCOSE 89 72 84 71 69*  BUN 58* 64* 70* 69* 69*  CREATININE 2.26* 2.63* 2.71* 2.16* 2.06*  CALCIUM 8.2* 8.2* 7.9* 8.1* 8.2*  MG  --  2.5* 2.4 2.5*  --   PHOS  --   --   --   --  4.8*   Estimated Creatinine Clearance: 36.5 mL/min (A) (by C-G formula based on SCr of 2.06 mg/dL (H)). Liver & Pancreas: Recent Labs  Lab 07/21/22 0520 07/22/22 0541 07/23/22 0324 07/27/22 0506  AST 118* 80* 179*  --   ALT 132* 98* 134*  --   ALKPHOS 127* 132* 137*  --   BILITOT 1.4* 1.2 1.8*  --   PROT 7.5 6.9 6.7  --   ALBUMIN 3.4* 3.1* 3.0* 3.1*   No results for input(s): "LIPASE", "AMYLASE" in the last 168 hours. No results for input(s): "AMMONIA" in the last 168 hours. Diabetic: No results for input(s): "HGBA1C" in the last 72 hours. Recent Labs  Lab 07/23/22 2142 07/24/22 0008 07/24/22 0257 07/24/22 0332 07/24/22 0538  GLUCAP 87 71 62* 71 94   Cardiac Enzymes: No results for input(s): "CKTOTAL", "CKMB", "CKMBINDEX", "TROPONINI" in the last 168 hours. No results for input(s): "PROBNP" in the last 8760 hours. Coagulation Profile: No results for input(s): "INR", "PROTIME" in the last 168 hours. Thyroid Function Tests: No results for input(s): "TSH", "T4TOTAL", "FREET4", "T3FREE", "THYROIDAB" in the last 72 hours. Lipid Profile: No results for input(s): "CHOL", "HDL", "LDLCALC", "TRIG", "CHOLHDL", "LDLDIRECT" in the  last 72 hours. Anemia Panel: No results for input(s): "VITAMINB12", "FOLATE", "FERRITIN", "TIBC", "IRON", "RETICCTPCT" in the last 72 hours. Urine analysis:  Component Value Date/Time   COLORURINE AMBER (A) 07/08/2022 2201   APPEARANCEUR CLOUDY (A) 07/08/2022 2201   APPEARANCEUR CLOUDY 01/15/2014 0209   LABSPEC 1.015 07/08/2022 2201   LABSPEC 1.014 01/15/2014 0209   PHURINE 5.0 07/08/2022 2201   GLUCOSEU NEGATIVE 07/08/2022 2201   GLUCOSEU NEGATIVE 01/15/2014 0209   HGBUR MODERATE (A) 07/08/2022 2201   BILIRUBINUR NEGATIVE 07/08/2022 2201   BILIRUBINUR NEGATIVE 01/15/2014 0209   KETONESUR NEGATIVE 07/08/2022 2201   PROTEINUR NEGATIVE 07/08/2022 2201   NITRITE NEGATIVE 07/08/2022 2201   LEUKOCYTESUR LARGE (A) 07/08/2022 2201   LEUKOCYTESUR 2+ 01/15/2014 0209   Sepsis Labs: Invalid input(s): "PROCALCITONIN", "LACTICIDVEN"  Microbiology: Recent Results (from the past 240 hour(s))  Blood culture (routine x 2)     Status: None   Collection Time: 07/23/2022 11:01 PM   Specimen: BLOOD  Result Value Ref Range Status   Specimen Description BLOOD BLOOD RIGHT WRIST  Final   Special Requests   Final    BOTTLES DRAWN AEROBIC AND ANAEROBIC Blood Culture results may not be optimal due to an inadequate volume of blood received in culture bottles   Culture   Final    NO GROWTH 5 DAYS Performed at [facility], [address] [place], [place] [address]    Report Status 07/22/2022 FINAL  Final  Blood culture (routine x 2)     Status: None   Collection Time: 08/09/2022 11:01 PM   Specimen: BLOOD  Result Value Ref Range Status   Specimen Description BLOOD BLOOD LEFT HAND  Final   Special Requests IN PEDIATRIC BOTTLE Blood Culture adequate volume  Final   Culture   Final    NO GROWTH 5 DAYS Performed at [facility], [address], [place], [place] [address]    Report Status 07/22/2022 FINAL  Final  MRSA Next Gen by PCR, Nasal     Status: None   Collection Time:  07/18/22 10:11 AM   Specimen: Nasal Mucosa; Nasal Swab  Result Value Ref Range Status   MRSA by PCR Next Gen NOT DETECTED NOT DETECTED Final    Comment: (NOTE) The GeneXpert MRSA Assay (FDA approved for NASAL specimens only), is one component of a comprehensive MRSA colonization surveillance program. It is not intended to diagnose MRSA infection nor to guide or monitor treatment for MRSA infections. Test performance is not FDA approved in patients less than 15 years old. Performed at [facility], [place], [place] [address]   Respiratory (~20 pathogens) panel by PCR     Status: None   Collection Time: 07/18/22 11:07 AM   Specimen: Nasopharyngeal Swab; Respiratory  Result Value Ref Range Status   Adenovirus NOT DETECTED NOT DETECTED Final   Coronavirus 229E NOT DETECTED NOT DETECTED Final    Comment: (NOTE) The Coronavirus on the Respiratory Panel, DOES NOT test for the novel  Coronavirus (2019 nCoV)    Coronavirus HKU1 NOT DETECTED NOT DETECTED Final   Coronavirus NL63 NOT DETECTED NOT DETECTED Final   Coronavirus OC43 NOT DETECTED NOT DETECTED Final   Metapneumovirus NOT DETECTED NOT DETECTED Final   Rhinovirus / Enterovirus NOT DETECTED NOT DETECTED Final   Influenza A NOT DETECTED NOT DETECTED Final   Influenza B NOT DETECTED NOT DETECTED Final   Parainfluenza Virus 1 NOT DETECTED NOT DETECTED Final   Parainfluenza Virus 2 NOT DETECTED NOT DETECTED Final   Parainfluenza Virus 3 NOT DETECTED NOT DETECTED Final   Parainfluenza Virus 4 NOT DETECTED NOT DETECTED Final  Respiratory Syncytial Virus NOT DETECTED NOT DETECTED Final   Bordetella pertussis NOT DETECTED NOT DETECTED Final   Bordetella Parapertussis NOT DETECTED NOT DETECTED Final   Chlamydophila pneumoniae NOT DETECTED NOT DETECTED Final   Mycoplasma pneumoniae NOT DETECTED NOT DETECTED Final    Comment: Performed at [facility], [place] [address], [place], [place] [address]  Urine  Culture     Status: None   Collection Time: 07/23/22  6:00 AM   Specimen: Urine, Clean Catch  Result Value Ref Range Status   Specimen Description   Final    URINE, CLEAN CATCH Performed at [facility], [address], [place], [place] [address]    Special Requests   Final    Normal Performed at [facility], [address] [place], [place] [address]    Culture   Final    NO GROWTH Performed at [facility], [place] [address] [place], [place] [address]    Report Status 07/24/2022 FINAL  Final    Radiology Studies: No results found.    Terrez Ander T. [place]  If 7PM-7AM, please contact night-coverage [email or web address] 07/27/2022, 12:00 PM

## 2022-07-28 DIAGNOSIS — G4733 Obstructive sleep apnea (adult) (pediatric): Secondary | ICD-10-CM | POA: Diagnosis not present

## 2022-07-28 DIAGNOSIS — Z7189 Other specified counseling: Secondary | ICD-10-CM

## 2022-07-28 DIAGNOSIS — I480 Paroxysmal atrial fibrillation: Secondary | ICD-10-CM | POA: Diagnosis not present

## 2022-07-28 DIAGNOSIS — J9621 Acute and chronic respiratory failure with hypoxia: Secondary | ICD-10-CM | POA: Diagnosis not present

## 2022-07-28 DIAGNOSIS — E1140 Type 2 diabetes mellitus with diabetic neuropathy, unspecified: Secondary | ICD-10-CM | POA: Diagnosis not present

## 2022-07-28 DIAGNOSIS — G9341 Metabolic encephalopathy: Secondary | ICD-10-CM

## 2022-07-28 LAB — CBC
HCT: 35.6 % — ABNORMAL LOW (ref 36.0–46.0)
Hemoglobin: 10.6 g/dL — ABNORMAL LOW (ref 12.0–15.0)
MCH: 25.8 pg — ABNORMAL LOW (ref 26.0–34.0)
MCHC: 29.8 g/dL — ABNORMAL LOW (ref 30.0–36.0)
MCV: 86.6 fL (ref 80.0–100.0)
Platelets: 343 10*3/uL (ref 150–400)
RBC: 4.11 MIL/uL (ref 3.87–5.11)
RDW: 24.3 % — ABNORMAL HIGH (ref 11.5–15.5)
WBC: 8.2 10*3/uL (ref 4.0–10.5)
nRBC: 0 % (ref 0.0–0.2)

## 2022-07-28 LAB — BASIC METABOLIC PANEL
Anion gap: 14 (ref 5–15)
BUN: 62 mg/dL — ABNORMAL HIGH (ref 8–23)
CO2: 30 mmol/L (ref 22–32)
Calcium: 8.4 mg/dL — ABNORMAL LOW (ref 8.9–10.3)
Chloride: 93 mmol/L — ABNORMAL LOW (ref 98–111)
Creatinine, Ser: 1.6 mg/dL — ABNORMAL HIGH (ref 0.44–1.00)
GFR, Estimated: 35 mL/min — ABNORMAL LOW (ref >60)
Glucose, Bld: 77 mg/dL (ref 70–99)
Potassium: 3.2 mmol/L — ABNORMAL LOW (ref 3.5–5.1)
Sodium: 137 mmol/L (ref 135–145)

## 2022-07-28 LAB — GLUCOSE, CAPILLARY
Glucose-Capillary: 80 mg/dL (ref 70–99)
Glucose-Capillary: 160 mg/dL — ABNORMAL HIGH (ref 70–99)
Glucose-Capillary: 175 mg/dL — ABNORMAL HIGH (ref 70–99)

## 2022-07-28 LAB — MAGNESIUM: Magnesium: 2.3 mg/dL (ref 1.7–2.4)

## 2022-07-28 MED ORDER — HYDROCERIN EX CREA
TOPICAL_CREAM | Freq: Two times a day (BID) | CUTANEOUS | Status: DC
  Administered 2022-07-30: 1 via TOPICAL
  Filled 2022-07-28 (×2): qty 113

## 2022-07-28 MED ORDER — POTASSIUM CHLORIDE CRYS ER 20 MEQ PO TBCR
60.0000 meq | EXTENDED_RELEASE_TABLET | Freq: Once | ORAL | Status: AC
  Administered 2022-07-28: 60 meq via ORAL
  Filled 2022-07-28: qty 3

## 2022-07-28 NOTE — Progress Notes (Signed)
[facility]  ROUNDING NOTE   Subjective:   [name] with a past medical history of chronic diastolic heart failure, morbid obesity, hypothyroidism, diabetes mellitus type 2, obstructive sleep apnea, lymphedema, and chronic kidney disease stage IIIa.  Patient presents to the emergency department with complaints of shortness of breath.  She has been admitted for Acute pulmonary edema (HCC) [J81.0] Atrial fibrillation with RVR (HCC) [I48.91] HCAP (healthcare-associated pneumonia) [J18.9] Acute on chronic respiratory failure with hypoxia and hypercapnia ([place]) [Q46.96, J96.22]  Patient is known to our practice and is seen outpatient by Dr. Holley Raring.  Patient was last seen in our office on April 03, 2021.  Patient has been lost to follow-up since that time.    Patient seen resting in bed Alert and oriented, confusion remains Appetite continues to improve Remains on 4L No lower extremity edema   Objective:  Vital signs in last 24 hours:  Temp:  [97.6 F (36.4 C)-98.6 F (37 C)] 97.6 F (36.4 C) (01/16 0741) Pulse Rate:  [53-106] 106 (01/16 0741) Resp:  [14-20] 16 (01/16 0741) BP: (93-126)/(59-99) 126/77 (01/16 0741) SpO2:  [92 %-100 %] 94 % (01/16 0816) FiO2 (%):  [30 %] 30 % (01/15 2250) Weight:  [137.5 kg] 137.5 kg (01/16 0904)  Weight change:  Filed Weights   07/22/22 0321 07/23/22 0500 07/28/22 0904  Weight: (!) 140 kg 135.8 kg (!) 137.5 kg    Intake/Output: I/O last 3 completed shifts: In: 685.3 [I.V.:685.3] Out: 3600 [Urine:3600]   Intake/Output this shift:  Total I/O In: 240 [P.O.:240] Out: 400 [Urine:400]  Physical Exam: General: NAD  Head: Normocephalic, atraumatic. Dry oral mucosal membranes  Eyes: Anicteric  Lungs:  Clear to auscultation, normal effort, room air  Heart: Regular rate and rhythm  Abdomen:  Soft, nontender, obese  Extremities: Trace to 1+ peripheral edema.  Neurologic: Alert, myoclonic jerking present  Skin: No  lesions  Access: None    Basic Metabolic Panel: Recent Labs  Lab 07/25/22 0549 07/26/22 0717 07/27/22 0505 07/27/22 0506 07/28/22 0547  NA 136 134* 133* 135 137  K 4.5 4.6 3.8 3.9 3.2*  CL 92* 92* 92* 93* 93*  CO2 '29 29 28 29 30  '$ GLUCOSE 72 84 71 69* 77  BUN 64* 70* 69* 69* 62*  CREATININE 2.63* 2.71* 2.16* 2.06* 1.60*  CALCIUM 8.2* 7.9* 8.1* 8.2* 8.4*  MG 2.5* 2.4 2.5*  --  2.3  PHOS  --   --   --  4.8*  --      Liver Function Tests: Recent Labs  Lab 07/22/22 0541 07/23/22 0324 07/27/22 0506  AST 80* 179*  --   ALT 98* 134*  --   ALKPHOS 132* 137*  --   BILITOT 1.2 1.8*  --   PROT 6.9 6.7  --   ALBUMIN 3.1* 3.0* 3.1*    No results for input(s): "LIPASE", "AMYLASE" in the last 168 hours. No results for input(s): "AMMONIA" in the last 168 hours.  CBC: Recent Labs  Lab 07/24/22 0551 07/25/22 0549 07/26/22 0717 07/27/22 0505 07/28/22 0547  WBC 11.0* 11.3* 12.8* 8.7 8.2  HGB 10.5* 10.7* 11.6* 10.7* 10.6*  HCT 37.1 37.3 40.6 36.7 35.6*  MCV 91.4 90.3 90.6 87.8 86.6  PLT 359 373 374 381 343     Cardiac Enzymes: No results for input(s): "CKTOTAL", "CKMB", "CKMBINDEX", "TROPONINI" in the last 168 hours.  BNP: Invalid input(s): "POCBNP"  CBG: Recent Labs  Lab 07/24/22 0332 07/24/22 0538 07/27/22 1226 07/27/22 2342 07/28/22  [place]     Microbiology: Results for orders placed or performed during the hospital encounter of 08/06/2022  Blood culture (routine x 2)     Status: None   Collection Time: 08/07/2022 11:01 PM   Specimen: BLOOD  Result Value Ref Range Status   Specimen Description BLOOD BLOOD RIGHT WRIST  Final   Special Requests   Final    BOTTLES DRAWN AEROBIC AND ANAEROBIC Blood Culture results may not be optimal due to an inadequate volume of blood received in culture bottles   Culture   Final    NO GROWTH 5 DAYS Performed at [facility], [address] [place], [place] [address]    Report Status  07/22/2022 FINAL  Final  Blood culture (routine x 2)     Status: None   Collection Time: 07/16/2022 11:01 PM   Specimen: BLOOD  Result Value Ref Range Status   Specimen Description BLOOD BLOOD LEFT HAND  Final   Special Requests IN PEDIATRIC BOTTLE Blood Culture adequate volume  Final   Culture   Final    NO GROWTH 5 DAYS Performed at [facility], [address] [place] [address], [place], [place] [address]    Report Status 07/22/2022 FINAL  Final  MRSA Next Gen by PCR, Nasal     Status: None   Collection Time: 07/18/22 10:11 AM   Specimen: Nasal Mucosa; Nasal Swab  Result Value Ref Range Status   MRSA by PCR Next Gen NOT DETECTED NOT DETECTED Final    Comment: (NOTE) The GeneXpert MRSA Assay (FDA approved for NASAL specimens only), is one component of a comprehensive MRSA colonization surveillance program. It is not intended to diagnose MRSA infection nor to guide or monitor treatment for MRSA infections. Test performance is not FDA approved in patients less than 43 years old. Performed at [facility], [place], [place] [address]   Respiratory (~20 pathogens) panel by PCR     Status: None   Collection Time: 07/18/22 11:07 AM   Specimen: Nasopharyngeal Swab; Respiratory  Result Value Ref Range Status   Adenovirus NOT DETECTED NOT DETECTED Final   Coronavirus 229E NOT DETECTED NOT DETECTED Final    Comment: (NOTE) The Coronavirus on the Respiratory Panel, DOES NOT test for the novel  Coronavirus (2019 nCoV)    Coronavirus HKU1 NOT DETECTED NOT DETECTED Final   Coronavirus NL63 NOT DETECTED NOT DETECTED Final   Coronavirus OC43 NOT DETECTED NOT DETECTED Final   Metapneumovirus NOT DETECTED NOT DETECTED Final   Rhinovirus / Enterovirus NOT DETECTED NOT DETECTED Final   Influenza A NOT DETECTED NOT DETECTED Final   Influenza B NOT DETECTED NOT DETECTED Final   Parainfluenza Virus 1 NOT DETECTED NOT DETECTED Final   Parainfluenza Virus 2 NOT DETECTED NOT  DETECTED Final   Parainfluenza Virus 3 NOT DETECTED NOT DETECTED Final   Parainfluenza Virus 4 NOT DETECTED NOT DETECTED Final   Respiratory Syncytial Virus NOT DETECTED NOT DETECTED Final   Bordetella pertussis NOT DETECTED NOT DETECTED Final   Bordetella Parapertussis NOT DETECTED NOT DETECTED Final   Chlamydophila pneumoniae NOT DETECTED NOT DETECTED Final   Mycoplasma pneumoniae NOT DETECTED NOT DETECTED Final    Comment: Performed at [facility], [place]. [address], [place], [place] [address]  Urine Culture     Status: None   Collection Time: 07/23/22  6:00 AM   Specimen: Urine, Clean Catch  Result Value Ref Range Status  Specimen Description   Final    URINE, CLEAN CATCH Performed at [facility], [address], [place], [place] [address]    Special Requests   Final    Normal Performed at [facility], [address] [place], [place] [address]    Culture   Final    NO GROWTH Performed at [facility], [place] [address] [place],  [address]    Report Status 07/24/2022 FINAL  Final    Coagulation Studies: No results for input(s): "LABPROT", "INR" in the last 72 hours.  Urinalysis: No results for input(s): "COLORURINE", "LABSPEC", "PHURINE", "GLUCOSEU", "HGBUR", "BILIRUBINUR", "KETONESUR", "PROTEINUR", "UROBILINOGEN", "NITRITE", "LEUKOCYTESUR" in the last 72 hours.  Invalid input(s): "APPERANCEUR"    Imaging: DG Chest Port 1 View  Result Date: 07/27/2022 CLINICAL DATA:  Congestive failure EXAM: PORTABLE CHEST 1 VIEW COMPARISON:  07/23/2022 FINDINGS: Cardiac shadow is enlarged but stable. Persistent vascular congestion is noted with associated edema. Bilateral effusions are again noted. No bony abnormality is seen. IMPRESSION: No change from the prior exam. Electronically Signed   By: Inez Catalina M.D.   On: 07/27/2022 19:04   ECHOCARDIOGRAM COMPLETE  Result Date: 07/27/2022    ECHOCARDIOGRAM REPORT   Patient Name:   NICKI FURLAN Date of Exam: 07/27/2022 Medical Rec #:  [id number]             Height:       65.0 in Accession #:    [id number]            Weight:       299.4 lb Date of Birth:  07-13-1956             BSA:          2.348 m Patient Age:    12 years              BP:           100/62 mmHg Patient Gender: F                     HR:           100 bpm. Exam Location:  [facility] Procedure: 2D Echo, Cardiac Doppler and Color Doppler Indications:     CHF-acute diastolic K02.54  History:         Patient has prior history of Echocardiogram examinations, most                  recent 03/06/2022. CHF; Risk Factors:Sleep Apnea and Diabetes.  Sonographer:     Sherrie Sport Referring Phys:  [id number] Charlesetta Ivory GONFA Diagnosing Phys: Kathlyn Sacramento MD  Sonographer Comments: Technically challenging study due to limited acoustic windows and no apical window. IMPRESSIONS  1. Left ventricular ejection fraction, by estimation, is 25 to 30%. The left ventricle has severely decreased function. Left ventricular endocardial border not optimally defined to evaluate regional wall motion. The left ventricular internal cavity size  was mildly dilated. There is mild left ventricular hypertrophy. Left ventricular diastolic parameters are indeterminate.  2. Right ventricular systolic function is moderately reduced. The right ventricular size is normal. There is moderately elevated pulmonary artery systolic pressure. The estimated right ventricular systolic pressure is 62.8 mmHg.  3. Moderate pleural effusion in the left lateral region.  4. The mitral valve is normal in structure. No evidence of mitral valve regurgitation. No evidence of mitral stenosis.  5. The aortic valve is normal in structure. Aortic valve regurgitation is moderate.  Aortic valve sclerosis/calcification is present, without any evidence of aortic stenosis.  6. Aortic dilatation noted. 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  7. Moderately dilated  pulmonary artery. FINDINGS  Left Ventricle: Left ventricular ejection fraction, by estimation, is 25 to 30%. The left ventricle has severely decreased function. Left ventricular endocardial border not optimally defined to evaluate regional wall motion. The left ventricular internal cavity size was mildly dilated. There is mild left ventricular hypertrophy. Left ventricular diastolic parameters are indeterminate. Right Ventricle: The right ventricular size is normal. No increase in right ventricular wall thickness. Right ventricular systolic function is moderately reduced. There is moderately elevated pulmonary artery systolic pressure. The tricuspid regurgitant velocity is 3.19 m/s, and with an assumed right atrial pressure of 10 mmHg, the estimated right ventricular systolic pressure is 09.8 mmHg. Left Atrium: Left atrial size was normal in size. Right Atrium: Right atrial size was normal in size. Pericardium: There is no evidence of pericardial effusion. Mitral Valve: The mitral valve is normal in structure. No evidence of mitral valve regurgitation. No evidence of mitral valve stenosis. Tricuspid Valve: The tricuspid valve is normal in structure. Tricuspid valve regurgitation is mild . No evidence of tricuspid stenosis. Aortic Valve: The aortic valve is normal in structure. Aortic valve regurgitation is moderate. Aortic valve sclerosis/calcification is present, without any evidence of aortic stenosis. Pulmonic Valve: The pulmonic valve was normal in structure. Pulmonic valve regurgitation is mild. No evidence of pulmonic stenosis. Aorta: Aortic dilatation noted. 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 Pulmonary Artery: The pulmonary artery is moderately dilated. Venous: The inferior vena cava was not well visualized. IAS/Shunts: No atrial level shunt detected by color flow Doppler. Additional Comments: There is a moderate pleural effusion in the left lateral  region.  LEFT VENTRICLE PLAX 2D LVIDd:         5.80 cm LVIDs:         5.20 cm LV PW:         0.80 cm LV IVS:        1.20 cm LVOT diam:     2.20 cm LVOT Area:     3.80 cm  LEFT ATRIUM         Index LA diam:    2.60 cm 1.11 cm/m   AORTA Ao Root diam: 5.40 cm TRICUSPID VALVE TR Peak grad:   40.7 mmHg TR Vmax:        319.00 cm/s  SHUNTS Systemic Diam: 2.20 cm Kathlyn Sacramento MD Electronically signed by Kathlyn Sacramento MD Signature Date/Time: 07/27/2022/1:22:54 PM    Final      Medications:    lactated ringers 40 mL/hr at 07/27/22 1205    apixaban  5 mg Oral BID   atorvastatin  20 mg Oral Daily   budesonide (PULMICORT) nebulizer solution  0.25 mg Nebulization BID   buPROPion  150 mg Oral Daily   diltiazem  120 mg Oral Daily   hydrALAZINE  10 mg Oral Q8H   ipratropium-albuterol  3 mL Nebulization TID   isosorbide mononitrate  15 mg Oral BID   levothyroxine  250 mcg Oral Q0600   metoprolol tartrate  50 mg Oral TID   midodrine  5 mg Oral TID WC   pantoprazole  40 mg Oral Daily   rOPINIRole  1 mg Oral QHS   torsemide  20 mg Oral Daily   acetaminophen, guaiFENesin, levalbuterol, liver  oil-zinc oxide, metoprolol tartrate, ondansetron (ZOFRAN) IV, oxyCODONE, polyethylene glycol, senna-docusate  Assessment/ Plan:  Ms. Ebony Yorio is a 67 y.o.  female  past medical history of chronic diastolic heart failure, morbid obesity, hypothyroidism, diabetes mellitus type 2, obstructive sleep apnea, lymphedema, and chronic kidney disease stage IIIa.  Patient presents to the emergency department with complaints of shortness of breath.  She has been admitted for Acute pulmonary edema (HCC) [J81.0] Atrial fibrillation with RVR (HCC) [I48.91] HCAP (healthcare-associated pneumonia) [J18.9] Acute on chronic respiratory failure with hypoxia and hypercapnia (HCC) [Y05.11, J96.22]   Acute Kidney Injury on chronic kidney disease stage IIIa with baseline creatinine 1.35 and GFR of 54 on 07/16/22.  Acute kidney  injury appears multifactorial due to IV contrast exposure and aggressive diuresis.  IV contrast exposure noted on 07/30/2022.  Renal ultrasound negative for obstruction.  Renal function improving with gently hydration. Remains on Torsemide 20 mg daily.    Lab Results  Component Value Date   CREATININE 1.60 (H) 07/28/2022   CREATININE 2.06 (H) 07/27/2022   CREATININE 2.16 (H) 07/27/2022    Intake/Output Summary (Last 24 hours) at 07/28/2022 1023 Last data filed at 07/28/2022 0912 Gross per 24 hour  Intake 925.27 ml  Output 3650 ml  Net -2724.73 ml    2.  Acute on chronic systolic heart failure.  Echo completed on 03/06/2022 shows EF 45 to 50% with a mild to moderate MVR, TVR, and AVR.  Cardiology following. Restarted on Torsemide daily.   3. Anemia of chronic kidney disease  Normocytic Lab Results  Component Value Date   HGB 10.6 (L) 07/28/2022  Hemoglobin remains at goal. Will continue to monitor.  4.  Hypertension with chronic kidney disease.  Home regimen includes diltiazem, furosemide, metolazone, and metoprolol.  Currently receiving diltiazem and metoprolol only. Blood pressure stable    LOS: 11   1/16/202410:23 AM

## 2022-07-28 NOTE — Progress Notes (Addendum)
Occupational Therapy Treatment Patient Details Name: Madison Mcbride MRN: [id number] DOB: 1956-01-02 Today's Date: 07/28/2022   History of present illness 67 y.o. female with medical history significant of thyroid cancer, hypothyroidism, hyperlipidemia, Dm2, CKD stage 3, Pafib , CHFpEF, OSA on CPAP, was discharged earlier today to SNF and found to be tachycardic on intake and sent back to ED.   OT comments  Ms Ahlberg was seen for OT/PT co -treatment on this date. Upon arrival to room pt reclined in bed, agreeable to tx. Pt requires MAX A x2 bed mobility, tolerates ~12 min sitting EOB. MAX A don B socks seated EOB. MIN A seated grooming. Attempted standing, unable to clear rear with TOTAL A x2. Pt making progress toward goals, will continue to follow POC. Discharge recommendation remains appropriate.     Recommendations for follow up therapy are one component of a multi-disciplinary discharge planning process, led by the attending physician.  Recommendations may be updated based on patient status, additional functional criteria and insurance authorization.    Follow Up Recommendations  Skilled nursing-short term rehab (<3 hours/day)     Assistance Recommended at Discharge Frequent or constant Supervision/Assistance  Patient can return home with the following  Two people to help with walking and/or transfers;A lot of help with bathing/dressing/bathroom;Assistance with cooking/housework;Direct supervision/assist for medications management;Direct supervision/assist for financial management;Assist for transportation;Help with stairs or ramp for entrance   Equipment Recommendations  Other (comment)    Recommendations for Other Services      Precautions / Restrictions Precautions Precautions: Fall Restrictions Weight Bearing Restrictions: No       Mobility Bed Mobility Overal bed mobility: Needs Assistance Bed Mobility: Supine to Sit, Sit to Supine     Supine to sit: Max  assist, +2 for physical assistance, HOB elevated Sit to supine: Max assist, +2 for safety/equipment, HOB elevated        Transfers Overall transfer level: Needs assistance Equipment used: 2 person hand held assist Transfers: Sit to/from Stand Sit to Stand: Total assist, +2 physical assistance                 Balance Overall balance assessment: Needs assistance Sitting-balance support: No upper extremity supported, Feet supported Sitting balance-Leahy Scale: Poor Sitting balance - Comments: intermittent MIN A                                   ADL either performed or assessed with clinical judgement   ADL Overall ADL's : Needs assistance/impaired                                       General ADL Comments: MAX A don B socks seated EOB. MIN A seated grooming.      Cognition Arousal/Alertness: Awake/alert Behavior During Therapy: WFL for tasks assessed/performed Overall Cognitive Status: Impaired/Different from baseline                                 General Comments: upon arrival pt gestures towards wall and introduces her husband Rush Landmark however no on eis present, per RN family has not visited this date. Upon sitting pt states oh has Bill left?                   Pertinent Vitals/ Pain  Pain Assessment Pain Assessment: Faces Faces Pain Scale: Hurts a little bit Pain Location: knees Pain Descriptors / Indicators: Discomfort Pain Intervention(s): Limited activity within patient's tolerance, Repositioned   Frequency  Min 2X/week        Progress Toward Goals  OT Goals(current goals can now be found in the care plan section)  Progress towards OT goals: Progressing toward goals  Acute Rehab OT Goals Patient Stated Goal: to go to rehab OT Goal Formulation: With patient Time For Goal Achievement: 08/07/22 Potential to Achieve Goals: Good ADL Goals Pt Will Perform Grooming: sitting;with set-up;with  supervision Pt Will Perform Lower Body Dressing: sitting/lateral leans;with mod assist Pt Will Transfer to Toilet: with mod assist;with transfer board;bedside commode  Plan Discharge plan remains appropriate;Frequency remains appropriate    Co-evaluation    PT/OT/SLP Co-Evaluation/Treatment: Yes Reason for Co-Treatment: Necessary to address cognition/behavior during functional activity;For patient/therapist safety PT goals addressed during session: Mobility/safety with mobility OT goals addressed during session: ADL's and self-care      AM-PAC OT "6 Clicks" Daily Activity     Outcome Measure   Help from another person eating meals?: None Help from another person taking care of personal grooming?: A Little Help from another person toileting, which includes using toliet, bedpan, or urinal?: A Lot Help from another person bathing (including washing, rinsing, drying)?: A Lot Help from another person to put on and taking off regular upper body clothing?: A Little Help from another person to put on and taking off regular lower body clothing?: A Lot 6 Click Score: 16    End of Session    OT Visit Diagnosis: Unsteadiness on feet (R26.81);Muscle weakness (generalized) (M62.81)   Activity Tolerance Patient tolerated treatment well   Patient Left in bed;with call bell/phone within reach;with bed alarm set   Nurse Communication Mobility status        Time: 1030-1055 OT Time Calculation (min): 25 min  Charges: OT General Charges $OT Visit: 1 Visit OT Treatments $Self Care/Home Management : 8-22 mins  Dessie Coma, M.S. OTR/L  07/28/22, 1:47 PM  ascom [phone]

## 2022-07-28 NOTE — Consult Note (Signed)
Consultation Note Date: 07/28/2022   Patient Name: Madison Mcbride  DOB: 01/14/1956  MRN: [id number]  Age / Sex: 67 y.o., female  PCP: Rusty Aus, MD Referring Physician: Mercy Riding, MD  Reason for Consultation: Establishing goals of care  HPI/Patient Profile: 67 year old F with PMH of A-fib on Eliquis, diastolic CHF, DM-2, HTN, HLD, hypothyroidism, thyroid cancer, anxiety, depression, morbid obesity, chronic back pain, OSA on CPAP and CKD-3A who was sent back by SNF on the day of discharge due to tachycardia on intake.  Pt husband states that she appeared diaphoretic and slightly dyspneic, and somnolent.  Upon admission initially placed on BiPAP for acute on chronic hypoxic and hypercapnic respiratory failure and started on vancomycin, cefepime and Flagyl for possible hospital-acquired pneumonia.  Patient remained hypotensive despite of IV fluids therefore required Levophed and was monitored in the ICU.  Cardiology team was consulted.  There were signs of volume overload and increasing work of breathing on presentation and she was started on IV Lasix.  She also had some metolazone in addition to IV contrast with CT chest to rule out PE.  As a result, she developed AKI which is improving.  Unfortunately, TTE with LVEF of 25 to 30% and RVSP of 50.7 mmHg (45 to 50% and normal RVSP in 02/2022).  Palliative medicine consulted.  Cardiology and nephrology following.   Clinical Assessment and Goals of Care: Notes and labs reviewed.  In to see patient.  No family at bedside.  Patient is able to tell me her name, states we are at [facility], is able to tell me the year is 2024 after a period of thought.   She states she lives at home with her husband.  She does not answer further questions, and then looks at herself in the mirror and asked in the direction of the mirror "did you have a question you wanted to ask"?    Stepped out and called patient's husband.  Husband discusses that she uses a trilogy at home.  He states that he has been advised that her confusion is likely due to her receiving contrast earlier in her admission, and states he was advised to give her kidneys time to heal.  He states typically when she has confusion it is because of a UTI, fluid overload, or " a problem with her ABG".  He states that she has a trilogy at home which she has become more reliable with using than she used to be.  Broached goals of care.  Husband indicates that this is already been discussed with attending, and "yes, she would want to be resuscitated".  With conversation, he indicates that she would want what ever care is indicated without restriction.   Full code/full scope at this time.  PMT will continue to follow.  SUMMARY OF RECOMMENDATIONS    PMT will follow Full code/ full scope     Primary Diagnoses: Present on Admission:  Paroxysmal atrial fibrillation with RVR ([place])  Type 2 diabetes mellitus with diabetic neuropathy, without  long-term current use of insulin (HCC)  Hypothyroidism  HLD (hyperlipidemia)  OSA (obstructive sleep apnea)  Thoracic aortic aneurysm (HCC)  Hyperkalemia  Elevated troponin  Hypotension  Acute respiratory failure with hypoxia (HCC)  Leukocytosis  Normocytic anemia  Acute on chronic respiratory failure with hypoxia and hypercapnia (HCC)  Acute metabolic encephalopathy  Acute on chronic systolic CHF (congestive heart failure) ([place])   I have reviewed the medical record, interviewed the patient and family, and examined the patient. The following aspects are pertinent.  Past Medical History:  Diagnosis Date   CHF (congestive heart failure) (HCC)    Diabetes mellitus without complication (HCC)    Hypothyroidism    Lymphedema    RLS (restless legs syndrome)    Sleep apnea    Thyroid cancer ([place]) 2007   Social History   Socioeconomic History   Marital status:  Married    Spouse name: Not on file   Number of children: Not on file   Years of education: Not on file   Highest education level: Not on file  Occupational History   Not on file  Tobacco Use   Smoking status: Never   Smokeless tobacco: Never  Substance and Sexual Activity   Alcohol use: No   Drug use: No   Sexual activity: Not on file  Other Topics Concern   Not on file  Social History Narrative   Not on file   Social Determinants of Health   Financial Resource Strain: Not on file  Food Insecurity: No Food Insecurity (07/18/2022)   Hunger Vital Sign    Worried About Running Out of Food in the Last Year: Never true    Ran Out of Food in the Last Year: Never true  Transportation Needs: No Transportation Needs (07/18/2022)   PRAPARE - Hydrologist (Medical): No    Lack of Transportation (Non-Medical): No  Physical Activity: Not on file  Stress: Not on file  Social Connections: Not on file   Family History  Problem Relation Age of Onset   Autoimmune disease Sister    Breast cancer Neg Hx    Scheduled Meds:  apixaban  5 mg Oral BID   atorvastatin  20 mg Oral Daily   budesonide (PULMICORT) nebulizer solution  0.25 mg Nebulization BID   buPROPion  150 mg Oral Daily   diltiazem  120 mg Oral Daily   hydrALAZINE  10 mg Oral Q8H   ipratropium-albuterol  3 mL Nebulization TID   isosorbide mononitrate  15 mg Oral BID   levothyroxine  250 mcg Oral Q0600   metoprolol tartrate  50 mg Oral TID   midodrine  5 mg Oral TID WC   pantoprazole  40 mg Oral Daily   rOPINIRole  1 mg Oral QHS   torsemide  20 mg Oral Daily   Continuous Infusions:  lactated ringers 40 mL/hr at 07/27/22 1205   PRN Meds:.acetaminophen, guaiFENesin, levalbuterol, liver oil-zinc oxide, metoprolol tartrate, ondansetron (ZOFRAN) IV, oxyCODONE, polyethylene glycol, senna-docusate Medications Prior to Admission:  Prior to Admission medications   Medication Sig Start Date End Date  Taking? Authorizing Provider  acetaminophen (TYLENOL) 650 MG CR tablet Take 1,300 mg by mouth every 8 (eight) hours as needed for pain.   Yes [provider]  ALPRAZolam (XANAX) 0.25 MG tablet Take 1 tablet (0.25 mg total) by mouth 3 (three) times daily as needed for anxiety. 05/29/22  Yes Sreenath, Trula Slade, MD  apixaban (ELIQUIS) 5 MG TABS tablet Take  1 tablet (5 mg total) by mouth 2 (two) times daily. 07/25/21  Yes Dessa Phi, DO  atorvastatin (LIPITOR) 20 MG tablet Take 20 mg by mouth daily.   Yes [provider]  Biotin 5 MG TABS Take 2.5 mg by mouth daily.   Yes [provider]  buPROPion (WELLBUTRIN XL) 150 MG 24 hr tablet Take 150 mg by mouth daily.   Yes [provider]  Catheter Self-Adhesive Urinary MISC Continue PurWIck prn if available 03/18/22  Yes Emeterio Reeve, DO  Cholecalciferol 25 MCG (1000 UT) tablet Take 1 tablet by mouth daily.   Yes [provider]  cyclobenzaprine (FLEXERIL) 5 MG tablet Take 5 mg by mouth 3 (three) times daily as needed for muscle spasms.   Yes [provider]  diltiazem (CARDIZEM) 60 MG tablet Take 1 tablet (60 mg total) by mouth every 8 (eight) hours. 07/14/2022  Yes Richarda Osmond, MD  furosemide (LASIX) 40 MG tablet Take 1 tablet (40 mg total) by mouth 2 (two) times daily. Increase to 1 tablet (40 mg total) by mouth THREE TIMES daily (total daily dose 120 mg) as needed for up to 3 days for increased leg swelling, shortness of breath, weight gain 5+ lbs over 1-2 days. Seek medical care if these symptoms are not improving with increased dose. Reduce dose to 1 tablet (40 mg total) by mouth ONCE DAILY if dizziness or low blood pressure on bid dose. 03/18/22  Yes Emeterio Reeve, DO  gabapentin (NEURONTIN) 100 MG capsule Take 2 capsules (200 mg total) by mouth 3 (three) times daily. 05/29/22  Yes Sreenath, Sudheer B, MD  Iron, Ferrous Sulfate, 325 (65 Fe) MG TABS Take 325 mg by mouth daily at 12 noon.  05/29/22  Yes Sreenath, Sudheer B, MD  levothyroxine (SYNTHROID) 125 MCG tablet Take 2 tablets (250 mcg total) by mouth daily. Takes two tablets for a total of 250 mcg daily. 07/15/22  Yes Richarda Osmond, MD  metolazone (ZAROXOLYN) 2.5 MG tablet Take 2.5 mg by mouth daily as needed (if weight up > 5 pounds).   Yes [provider]  Metoprolol Tartrate 75 MG TABS Take 75 mg by mouth 2 (two) times daily. 07/21/2022  Yes Richarda Osmond, MD  Mouthwashes (MOUTH RINSE) LIQD solution 15 mLs by Mouth Rinse route as needed (oral care). 03/18/22  Yes Emeterio Reeve, DO  Multiple Vitamin (MULTIVITAMIN) tablet Take 1 tablet by mouth daily.   Yes [provider]  naloxone (NARCAN) nasal spray 4 mg/0.1 mL Place 1 spray into the nose as needed (opioid overdose).   Yes [provider]  pantoprazole (PROTONIX) 40 MG tablet Take 40 mg by mouth daily. 01/11/21  Yes [provider]  polyethylene glycol (MIRALAX / GLYCOLAX) 17 g packet Take 17 g by mouth daily as needed for moderate constipation. 03/18/22  Yes Emeterio Reeve, DO  potassium chloride (KLOR-CON) 20 MEQ packet Take 20 mEq by mouth daily.   Yes [provider]  rOPINIRole (REQUIP) 3 MG tablet Take 1 tablet (3 mg total) by mouth at bedtime. 03/18/22  Yes Emeterio Reeve, DO  Semaglutide, 1 MG/DOSE, 2 MG/1.5ML SOPN Inject 0.75 mLs into the skin once a week. 10/14/20  Yes [provider]  traMADol (ULTRAM) 50 MG tablet Take 50 mg by mouth every 6 (six) hours as needed for moderate pain.   Yes [provider]  zinc gluconate 50 MG tablet Take 50 mg by mouth daily.   Yes [provider]  No Known Allergies Review of Systems  All other systems reviewed and are negative.   Physical Exam Pulmonary:     Effort: Pulmonary effort is normal.  Neurological:     Mental Status: She is alert.     Vital Signs: BP 115/67 (BP Location: Right Arm)   Pulse (!) 55   Temp 98.3 F (36.8 C)  (Oral)   Resp 16   Ht '5\' 5"'$  (1.651 m)   Wt (!) 137.5 kg   SpO2 93%   BMI 50.44 kg/m  Pain Scale: 0-10 POSS *See Group Information*: 1-Acceptable,Awake and alert Pain Score: 0-No pain   SpO2: SpO2: 93 % O2 Device:SpO2: 93 % O2 Flow Rate: .O2 Flow Rate (L/min): 4 L/min  IO: Intake/output summary:  Intake/Output Summary (Last 24 hours) at 07/28/2022 1600 Last data filed at 07/28/2022 1338 Gross per 24 hour  Intake 925.27 ml  Output 3650 ml  Net -2724.73 ml    LBM: Last BM Date : 07/25/22 Baseline Weight: Weight: (!) 136.5 kg Most recent weight: Weight: (!) 137.5 kg       Signed by: Asencion Gowda, NP   Please contact Palliative Medicine Team phone at [phone] for questions and concerns.  For individual provider: See Shea Evans

## 2022-07-28 NOTE — Progress Notes (Signed)
[facility] Cardiology    SUBJECTIVE: Patient states to be doing reasonably well reduced shortness of breath no pain states she is using her CPAP nightly.  No fever chills or sweats still has lower extremity edema   Vitals:   07/28/22 1415 07/28/22 1600 07/28/22 1624 07/28/22 1755  BP:   107/75   Pulse:   (!) 112   Resp:   17   Temp:   98.2 F (36.8 C)   TempSrc:   Oral   SpO2: 93% (!) 82% 95% 96%  Weight:      Height:         Intake/Output Summary (Last 24 hours) at 07/28/2022 1758 Last data filed at 07/28/2022 1624 Gross per 24 hour  Intake 925.27 ml  Output 4400 ml  Net -3474.73 ml      PHYSICAL EXAM  General: Well developed, well nourished, in no acute distress resting comfortably HEENT:  Normocephalic and atramatic Neck:  No JVD.  Lungs: Clear bilaterally to auscultation and percussion. Heart: Irregularly irregular. Normal S1 and S2 without gallops or murmurs.  Abdomen: Bowel sounds are positive, abdomen soft and non-tender  Msk:  Back normal, normal gait. Normal strength and tone for age. Extremities: No clubbing, cyanosis or edema.   Neuro: Alert and oriented X 3. Psych:  Good affect, responds appropriately   LABS: Basic Metabolic Panel: Recent Labs    07/27/22 0505 07/27/22 0506 07/28/22 0547  NA 133* 135 137  K 3.8 3.9 3.2*  CL 92* 93* 93*  CO2 '28 29 30  '$ GLUCOSE 71 69* 77  BUN 69* 69* 62*  CREATININE 2.16* 2.06* 1.60*  CALCIUM 8.1* 8.2* 8.4*  MG 2.5*  --  2.3  PHOS  --  4.8*  --    Liver Function Tests: Recent Labs    07/27/22 0506  ALBUMIN 3.1*   No results for input(s): "LIPASE", "AMYLASE" in the last 72 hours. CBC: Recent Labs    07/27/22 0505 07/28/22 0547  WBC 8.7 8.2  HGB 10.7* 10.6*  HCT 36.7 35.6*  MCV 87.8 86.6  PLT 381 343   Cardiac Enzymes: No results for input(s): "CKTOTAL", "CKMB", "CKMBINDEX", "TROPONINI" in the last 72 hours. BNP: Invalid input(s): "POCBNP" D-Dimer: No results for input(s): "DDIMER" in the last 72  hours. Hemoglobin A1C: No results for input(s): "HGBA1C" in the last 72 hours. Fasting Lipid Panel: No results for input(s): "CHOL", "HDL", "LDLCALC", "TRIG", "CHOLHDL", "LDLDIRECT" in the last 72 hours. Thyroid Function Tests: No results for input(s): "TSH", "T4TOTAL", "T3FREE", "THYROIDAB" in the last 72 hours.  Invalid input(s): "FREET3" Anemia Panel: No results for input(s): "VITAMINB12", "FOLATE", "FERRITIN", "TIBC", "IRON", "RETICCTPCT" in the last 72 hours.  DG Chest Port 1 View  Result Date: 07/27/2022 CLINICAL DATA:  Congestive failure EXAM: PORTABLE CHEST 1 VIEW COMPARISON:  07/23/2022 FINDINGS: Cardiac shadow is enlarged but stable. Persistent vascular congestion is noted with associated edema. Bilateral effusions are again noted. No bony abnormality is seen. IMPRESSION: No change from the prior exam. Electronically Signed   By: Inez Catalina M.D.   On: 07/27/2022 19:04   ECHOCARDIOGRAM COMPLETE  Result Date: 07/27/2022    ECHOCARDIOGRAM REPORT   Patient Name:   CARIANA KARGE Date of Exam: 07/27/2022 Medical Rec #:  [id number]             Height:       65.0 in Accession #:    [id number]            Weight:  299.4 lb Date of Birth:  Mar 30, 1956             BSA:          2.348 m Patient Age:    72 years              BP:           100/62 mmHg Patient Gender: F                     HR:           100 bpm. Exam Location:  [facility] Procedure: 2D Echo, Cardiac Doppler and Color Doppler Indications:     CHF-acute diastolic U93.23  History:         Patient has prior history of Echocardiogram examinations, most                  recent 03/06/2022. CHF; Risk Factors:Sleep Apnea and Diabetes.  Sonographer:     Sherrie Sport Referring Phys:  [id number] Charlesetta Ivory GONFA Diagnosing Phys: Kathlyn Sacramento MD  Sonographer Comments: Technically challenging study due to limited acoustic windows and no apical window. IMPRESSIONS  1. Left ventricular ejection fraction, by estimation, is 25 to 30%. The left ventricle has  severely decreased function. Left ventricular endocardial border not optimally defined to evaluate regional wall motion. The left ventricular internal cavity size  was mildly dilated. There is mild left ventricular hypertrophy. Left ventricular diastolic parameters are indeterminate.  2. Right ventricular systolic function is moderately reduced. The right ventricular size is normal. There is moderately elevated pulmonary artery systolic pressure. The estimated right ventricular systolic pressure is 25.4 mmHg.  3. Moderate pleural effusion in the left lateral region.  4. The mitral valve is normal in structure. No evidence of mitral valve regurgitation. No evidence of mitral stenosis.  5. The aortic valve is normal in structure. Aortic valve regurgitation is moderate. Aortic valve sclerosis/calcification is present, without any evidence of aortic stenosis.  6. Aortic dilatation noted. 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  7. Moderately dilated pulmonary artery. FINDINGS  Left Ventricle: Left ventricular ejection fraction, by estimation, is 25 to 30%. The left ventricle has severely decreased function. Left ventricular endocardial border not optimally defined to evaluate regional wall motion. The left ventricular internal cavity size was mildly dilated. There is mild left ventricular hypertrophy. Left ventricular diastolic parameters are indeterminate. Right Ventricle: The right ventricular size is normal. No increase in right ventricular wall thickness. Right ventricular systolic function is moderately reduced. There is moderately elevated pulmonary artery systolic pressure. The tricuspid regurgitant velocity is 3.19 m/s, and with an assumed right atrial pressure of 10 mmHg, the estimated right ventricular systolic pressure is 27.0 mmHg. Left Atrium: Left atrial size was normal in size. Right Atrium: Right atrial size was normal in size. Pericardium: There is no  evidence of pericardial effusion. Mitral Valve: The mitral valve is normal in structure. No evidence of mitral valve regurgitation. No evidence of mitral valve stenosis. Tricuspid Valve: The tricuspid valve is normal in structure. Tricuspid valve regurgitation is mild . No evidence of tricuspid stenosis. Aortic Valve: The aortic valve is normal in structure. Aortic valve regurgitation is moderate. Aortic valve sclerosis/calcification is present, without any evidence of aortic stenosis. Pulmonic Valve: The pulmonic valve was normal in structure. Pulmonic valve regurgitation is mild. No evidence of pulmonic stenosis. Aorta: Aortic dilatation noted. There is severe dilatation of the aortic  root and of the ascending aorta, measuring 54 mm. Pulmonary Artery: The pulmonary artery is moderately dilated. Venous: The inferior vena cava was not well visualized. IAS/Shunts: No atrial level shunt detected by color flow Doppler. Additional Comments: There is a moderate pleural effusion in the left lateral region.  LEFT VENTRICLE PLAX 2D LVIDd:         5.80 cm LVIDs:         5.20 cm LV PW:         0.80 cm LV IVS:        1.20 cm LVOT diam:     2.20 cm LVOT Area:     3.80 cm  LEFT ATRIUM         Index LA diam:    2.60 cm 1.11 cm/m   AORTA Ao Root diam: 5.40 cm TRICUSPID VALVE TR Peak grad:   40.7 mmHg TR Vmax:        319.00 cm/s  SHUNTS Systemic Diam: 2.20 cm Kathlyn Sacramento MD Electronically signed by Kathlyn Sacramento MD Signature Date/Time: 07/27/2022/1:22:54 PM    Final      Echo severely depressed left ventricular function EF around 25%  TELEMETRY: Atrial fibrillation rate of around 100 nonspecific ST-T wave changes:  ASSESSMENT AND PLAN:  Principal Problem:   Paroxysmal atrial fibrillation with RVR (HCC) Active Problems:   Type 2 diabetes mellitus with diabetic neuropathy, without long-term current use of insulin (HCC)   Hypothyroidism   Acute metabolic encephalopathy   HLD (hyperlipidemia)   Elevated troponin    OSA (obstructive sleep apnea)   Acute respiratory failure with hypoxia (HCC)   Acute on chronic systolic CHF (congestive heart failure) (HCC)   Acute on chronic respiratory failure with hypoxia and hypercapnia (HCC)   Thoracic aortic aneurysm (HCC)   Hyperkalemia   Hypotension   Leukocytosis   Normocytic anemia   Acute pulmonary edema (HCC)   HAP (hospital-acquired pneumonia)   Goals of care, counseling/discussion   Plan  Atrial fibrillation paroxysmal with tachycardia reverted to sinus with mild bradycardia continue current therapy Diabetes type 2 continue current management Obstructive sleep apnea continue CPAP BiPAP at night For obesity recommend significant weight loss exercise portion control Acute on chronic respiratory failure with hypoxemia hypercapnia continue CPAP and BiPAP as needed Hyperlipidemia continue statin therapy for lipid management Hospital course pneumonia continue broad-spectrum antibiotic therapy as necessary Hypokalemia recommend correct electrolytes Try to increase activity with physical therapy Agree with palliative care consult    Yolonda Kida, MD 07/28/2022 5:58 PM

## 2022-07-28 NOTE — Progress Notes (Signed)
Physical Therapy Treatment Patient Details Name: Madison Mcbride MRN: [id number] DOB: [birth year] Today's Date: 07/28/2022   History of Present Illness 67 y.o. female with medical history significant of thyroid cancer, hypothyroidism, hyperlipidemia, Dm2, CKD stage 3, Pafib , CHFpEF, OSA on CPAP, was discharged earlier today to SNF and found to be tachycardic on intake and sent back to ED.    PT Comments    PT/OT co-treatment performed.  Pt oriented to person, hospital, [place]; not date or situation; pt initially reported her husband was present (no visitor was present though) but once pt sat up on edge of bed, pt did mention that her husband must have left (nurse reports pt's husband has not been here today yet).  During session pt max assist x2 with bed mobility; total assist x2 with attempts to stand (pt did not initiate to stand 1st trial and minimal initiation 2nd trial); total assist x2 with 2 attempts to laterally scoot towards R along bed (pt initiating to assist some but no real movement achieved with 2 assist).  Tolerated sitting LE ex's on edge of bed fairly well.  Pt assisted back to bed end of session with 2 assist.  Pt's O2 sats mostly 88-91% on 4 L O2 via nasal cannula during sessions activities but lowest O2 sat 86%; pt's HR 92-107 bpm during sessions activities.  Will continue to focus on strengthening, balance, and progressive functional mobility per pt tolerance.    Recommendations for follow up therapy are one component of a multi-disciplinary discharge planning process, led by the attending physician.  Recommendations may be updated based on patient status, additional functional criteria and insurance authorization.  Follow Up Recommendations  Skilled nursing-short term rehab (<3 hours/day) Can patient physically be transported by private vehicle: No   Assistance Recommended at Discharge Frequent or constant Supervision/Assistance  Patient can return home with the  following Two people to help with walking and/or transfers;Help with stairs or ramp for entrance;Assist for transportation;Assistance with cooking/housework;Direct supervision/assist for medications management;Direct supervision/assist for financial management;Two people to help with bathing/dressing/bathroom   Equipment Recommendations  Wheelchair (measurements PT);Wheelchair cushion (measurements PT);Hospital bed;BSC/3in1;Other (comment) (hoyer lift)    Recommendations for Other Services       Precautions / Restrictions Precautions Precautions: Fall Restrictions Weight Bearing Restrictions: No     Mobility  Bed Mobility Overal bed mobility: Needs Assistance Bed Mobility: Supine to Sit, Sit to Supine     Supine to sit: Max assist, +2 for physical assistance, HOB elevated Sit to supine: Max assist, +2 for safety/equipment, HOB elevated   General bed mobility comments: assist for trunk and B LE's; vc's for technique; 2 assist to boost pt up in bed using bed sheet end of session    Transfers Overall transfer level: Needs assistance Equipment used: 2 person hand held assist Transfers: Sit to/from Stand Sit to Stand: Total assist, +2 physical assistance           General transfer comment: pt did not initiate to stand 1st trial and minimal initiation to stand 2nd trial; vc's for technique    Ambulation/Gait               General Gait Details: not appropriate at this time   Stairs             Wheelchair Mobility    Modified Rankin (Stroke Patients Only)       Balance Overall balance assessment: Needs assistance Sitting-balance support: No upper extremity supported, Feet supported Sitting balance-Leahy Scale:  Poor Sitting balance - Comments: fluctuating between CGA to min assist for sitting balance; vc's to shift weight forward (decrease posterior lean)                                    Cognition Arousal/Alertness:  Awake/alert Behavior During Therapy:  (appearing confused) Overall Cognitive Status: Impaired/Different from baseline                                 General Comments: Oriented to person, hospital, [place] but not situation or date.        Exercises General Exercises - Lower Extremity Ankle Circles/Pumps: AROM, Strengthening, Both, 10 reps, Seated Long Arc Quad: AROM, Strengthening, Both, 10 reps, Seated Hip Flexion/Marching: AROM, Strengthening, Both, 10 reps, Seated    General Comments  Nursing cleared pt for participation in physical therapy.  Pt agreeable to PT session.      Pertinent Vitals/Pain Pain Assessment Pain Assessment: 0-10 Pain Intervention(s): Limited activity within patient's tolerance, Monitored during session    Home Living                          Prior Function            PT Goals (current goals can now be found in the care plan section) Acute Rehab PT Goals Patient Stated Goal: to go back to rehab PT Goal Formulation: With patient Time For Goal Achievement: 08/07/22 Potential to Achieve Goals: Fair Progress towards PT goals: Progressing toward goals    Frequency    Min 2X/week      PT Plan Current plan remains appropriate    Co-evaluation PT/OT/SLP Co-Evaluation/Treatment: Yes Reason for Co-Treatment: Complexity of the patient's impairments (multi-system involvement);For patient/therapist safety;To address functional/ADL transfers PT goals addressed during session: Mobility/safety with mobility;Balance;Strengthening/ROM OT goals addressed during session: ADL's and self-care      AM-PAC PT "6 Clicks" Mobility   Outcome Measure  Help needed turning from your back to your side while in a flat bed without using bedrails?: A Lot Help needed moving from lying on your back to sitting on the side of a flat bed without using bedrails?: Total Help needed moving to and from a bed to a chair (including a wheelchair)?:  Total Help needed standing up from a chair using your arms (e.g., wheelchair or bedside chair)?: Total Help needed to walk in hospital room?: Total Help needed climbing 3-5 steps with a railing? : Total 6 Click Score: 7    End of Session Equipment Utilized During Treatment: Oxygen (4 L via nasal cannula) Activity Tolerance: Patient limited by fatigue Patient left: in bed;with call bell/phone within reach;with bed alarm set Nurse Communication: Mobility status;Precautions;Other (comment) (pt's HR and O2 vitals during session) PT Visit Diagnosis: Unsteadiness on feet (R26.81);Muscle weakness (generalized) (M62.81)     Time: 2992-4268 PT Time Calculation (min) (ACUTE ONLY): 23 min  Charges:  $Therapeutic Activity: 8-22 mins                     Leitha Bleak, PT 07/28/22, 11:21 AM

## 2022-07-28 NOTE — IPAL (Signed)
  Interdisciplinary Goals of Care Family Meeting   Date carried out: 07/28/2022  Location of the meeting: Bedside  Member's involved: Physician and Family Member or next of kin (patient's son)  Durable Power of Forensic psychologist or Loss adjuster, chartered: [name]  Discussion: We discussed goals of care for Madison Mcbride .   Patient with extensive comorbidities including advanced heart failure, CKD, OSA/OHS, atrial fibrillation, morbid obesity and ambulatory dysfunction requiring multiple hospitalizations.  Despite all intervention, she seems to be slowly declining with progressive heart failure and physical deconditioning.  I had extensive goals of care discussion with focus on CODE STATUS including pros and cons of CPR and intubation  with patient's son at the bedside on 07/27/2021.  Patient was not able to engage in this conversation due to some degree of encephalopathy.  Patient's son tells me that, his father is her POA.  He called and talked to his father who prefers to continue full code for now.  Patient's mental status improved on 1/16.  She is awake, alert and oriented x 4 except year that she calls 2023.  I updated her about the echocardiogram finding.  We also talked about CODE STATUS including pros and cons of CPR and intubation.  I recommended DNR/DNI.  She voiced understanding but prefers to remain full code until she talks to her family.  Palliative medicine has been consulted yesterday.  Code status:   Code Status: Full Code   Disposition: Continue current acute care  Time spent for the meeting: 30    Mercy Riding, MD  07/28/2022, 1:02 PM

## 2022-07-28 NOTE — Progress Notes (Signed)
PROGRESS NOTE  Madison Mcbride PZW:[id number] DOB: 03/15/1956   PCP: Rusty Aus, MD  Patient is from: SNF  DOA: 07/13/2022 LOS: 59  Chief complaints Chief Complaint  Patient presents with   Shortness of Breath     Brief Narrative / Interim history: 67 year old F with PMH of A-fib on Eliquis, diastolic CHF, DM-2, HTN, HLD, hypothyroidism, thyroid cancer, anxiety, depression, morbid obesity, chronic back pain, OSA on CPAP and CKD-3A who was sent back by SNF on the day of discharge due to tachycardia on intake.  Pt husband states that she appeared diaphoretic and slightly dyspneic, and somnolent.  Upon admission initially placed on BiPAP for acute on chronic hypoxic and hypercapnic respiratory failure and started on vancomycin, cefepime and Flagyl for possible hospital-acquired pneumonia.  Patient remained hypotensive despite of IV fluids therefore required Levophed and was monitored in the ICU.  Cardiology team was consulted.  There were signs of volume overload and increasing work of breathing on presentation and she was started on IV Lasix.  She also had some metolazone in addition to IV contrast with CT chest to rule out PE.  As a result, she developed AKI which is improving.  Unfortunately, TTE with LVEF of 25 to 30% and RVSP of 50.7 mmHg (45 to 50% and normal RVSP in 02/2022).  Palliative medicine consulted.  Cardiology and nephrology following.  Subjective: Seen and examined earlier this morning.  No major events overnight of this morning.  Feels better today.  She is awake and alert and oriented x 4 except year which she calls 2023.  She denies shortness of breath, chest pain or cough.  Denies GI or UTI symptoms.  Discussed about echo finding and goal of care with CODE STATUS.  She understands the danger of CPR in her condition but likes to discuss with family first.  Objective: Vitals:   07/28/22 0741 07/28/22 0816 07/28/22 0904 07/28/22 1113  BP: 126/77   115/67  Pulse: (!)  106   (!) 55  Resp: 16   16  Temp: 97.6 F (36.4 C)   98.3 F (36.8 C)  TempSrc: Oral   Oral  SpO2: 97% 94%  96%  Weight:   (!) 137.5 kg   Height:        Examination: GENERAL: No apparent distress.  Nontoxic. HEENT: MMM.  Vision and hearing grossly intact.  NECK: Supple.  No apparent JVD.  RESP:  No IWOB.  Diminished aeration partly due to body habitus. CVS:  RRR. Heart sounds normal.  ABD/GI/GU: BS+. Abd soft, NTND.  MSK/EXT:  Moves extremities. No apparent deformity. No edema.  SKIN: no apparent skin lesion or wound NEURO: Awake and alert. Oriented x 4 except year that she calls 2023.  No apparent focal neuro deficit. PSYCH: Calm. Normal affect.    Microbiology summarized: RVP and MRSA PCR screen nonreactive. Blood cultures NGTD  Assessment and plan: Principal Problem:   Paroxysmal atrial fibrillation with RVR (HCC) Active Problems:   Acute on chronic respiratory failure with hypoxia and hypercapnia (HCC)   Acute metabolic encephalopathy   Acute on chronic systolic CHF (congestive heart failure) (HCC)   HLD (hyperlipidemia)   OSA (obstructive sleep apnea)   Type 2 diabetes mellitus with diabetic neuropathy, without long-term current use of insulin (HCC)   Elevated troponin   Hypothyroidism   Acute respiratory failure with hypoxia (HCC)   Thoracic aortic aneurysm (HCC)   Hyperkalemia   Hypotension   Leukocytosis   Normocytic anemia   Acute  pulmonary edema (HCC)   HAP (hospital-acquired pneumonia)   Goals of care, counseling/discussion  Acute respiratory failure with hypoxia and hypercapnia-due to OSA/OHS, acute on chronic systolic CHF, and possible hospital-acquired pneumonia.   Pro-Cal 0.5.  Initial BNP 2300 (higher than prior values).  -Zosyn from 1/6 - 1/9> IV Unasyn> Augmentin>> 1/14.  -Continue bronchodilators, incentive spirometry -Defer diuretics and fluid to nephrology and cardiology -Begin to keep saturation above 88%.  High risk for CO2  retention. -BiPAP at night and when she sleeps  Acute on chronic systolic CHF/PAH: TTE with LVEF of 25 to 30% and RVSP of 50.7 mmHg (45 to 50% and normal RVSP in 02/2022).   BNP markedly elevated likely indicating poor prognosis although in the setting of renal failure.  However, she appears euvolemic although is difficult due to body habitus.  Developed AKI with IV diuretics.  AKI improving. -Cardiology and nephrology following -Strict I&O, daily weight and renal functions.   Afib with RVR: HR ranges from 50s to 100.   -Cardiology following -Continue metoprolol 50 mg 3 times daily and Cardizem CD120 mg daily -IV metoprolol 5 mg every 6 hours as needed -Continue anticoagulation   AKI on CKD-3B: B/l Cr ~1.2.  Likely from diuretics, contrast and possibly cardiorenal.  Renal US suboptimal.  Recent Labs    07/20/22 0246 07/21/22 0520 07/22/22 0541 07/23/22 0324 07/24/22 0551 07/25/22 0549 07/26/22 0717 07/27/22 0505 07/27/22 0506 07/28/22 0547  BUN 50* 41* 40* 46* 58* 64* 70* 69* 69* 62*  CREATININE 1.41* 1.22* 1.26* 1.67* 2.26* 2.63* 2.71* 2.16* 2.06* 1.60*  -Appreciate input by nephrology.  Acute toxic metabolic encephalopathy: Multifactorial including hypercapnia, uremia and significant dose of Requip.  Encephalopathy improved.  She is awake, alert and oriented x 4 except year today.  No focal neurodeficits. -Continue decreased dose of Requip -Manage hypercapnia as above. -Avoid or minimize sedating medications  Controlled DM-2 with CKD-3B and hypoglycemia: A1c 5.3% in 06/2022.  Has borderline low CBGs.  Cortisol within normal. -CBG monitoring every 8 hours.  Restless leg syndrome -Decreased Requip from 3 mg to 1 mg due to encephalopathy   Hypothyroidism  -Continue levothyroxine  Hypokalemia: K 3.2 -P.o. KCl 60 x 1   Chronic venous stasis dermatitis of both lower extremities  Anxiety/depression/chronic pain: Stable -Continue home meds.   OSA/OHS -BiPAP at night and  when she sleeps   Physical deconditioning: Patient is from SNF. -PT/OT  Goal of care counseling: Patient with extensive comorbidity as above.  Poor long-term prognosis.  Still full code.  -See IPAL note -Palliative medicine consulted.   Morbid obesity: Could be contributing to physical deconditioning and respiratory failure. Body mass index is 50.44 kg/m.           DVT prophylaxis:   apixaban (ELIQUIS) tablet 5 mg  Code Status: Full code Family Communication: None at bedside Level of care: Telemetry Cardiac Status is: Inpatient Remains inpatient appropriate because: AKI, acute on chronic systolic CHF   Final disposition: SNF Consultants:  Pulmonology-signed off Cardiology Nephrology Palliative medicine  55 minutes with more than 50% spent in reviewing records, counseling patient/family and coordinating care.   Sch Meds:  Scheduled Meds:  apixaban  5 mg Oral BID   atorvastatin  20 mg Oral Daily   budesonide (PULMICORT) nebulizer solution  0.25 mg Nebulization BID   buPROPion  150 mg Oral Daily   diltiazem  120 mg Oral Daily   hydrALAZINE  10 mg Oral Q8H   ipratropium-albuterol  3 mL Nebulization TID  isosorbide mononitrate  15 mg Oral BID   levothyroxine  250 mcg Oral Q0600   metoprolol tartrate  50 mg Oral TID   midodrine  5 mg Oral TID WC   pantoprazole  40 mg Oral Daily   rOPINIRole  1 mg Oral QHS   torsemide  20 mg Oral Daily   Continuous Infusions:  lactated ringers 40 mL/hr at 07/27/22 1205   PRN Meds:.acetaminophen, guaiFENesin, levalbuterol, liver oil-zinc oxide, metoprolol tartrate, ondansetron (ZOFRAN) IV, oxyCODONE, polyethylene glycol, senna-docusate  Antimicrobials: Anti-infectives (From admission, onward)    Start     Dose/Rate Route Frequency Ordered Stop   07/25/22 1200  amoxicillin-clavulanate (AUGMENTIN) 500-125 MG per tablet 1 tablet        1 tablet Oral 2 times daily 07/25/22 1102 07/26/22 2117   07/24/22 1400  Ampicillin-Sulbactam  (UNASYN) 3 g in sodium chloride 0.9 % 100 mL IVPB  Status:  Discontinued        3 g 200 mL/hr over 30 Minutes Intravenous Every 6 hours 07/24/22 1228 07/24/22 1231   07/24/22 1330  amoxicillin-clavulanate (AUGMENTIN) 875-125 MG per tablet 1 tablet  Status:  Discontinued        1 tablet Oral Every 12 hours 07/24/22 1231 07/25/22 1102   07/21/22 1500  Ampicillin-Sulbactam (UNASYN) 3 g in sodium chloride 0.9 % 100 mL IVPB        3 g 200 mL/hr over 30 Minutes Intravenous Every 6 hours 07/21/22 1357 07/22/22 2216   07/18/22 1900  azithromycin (ZITHROMAX) 500 mg in sodium chloride 0.9 % 250 mL IVPB  Status:  Discontinued        500 mg 250 mL/hr over 60 Minutes Intravenous Every 24 hours 07/18/22 0205 07/19/22 1204   07/18/22 1000  piperacillin-tazobactam (ZOSYN) IVPB 3.375 g  Status:  Discontinued        3.375 g 12.5 mL/hr over 240 Minutes Intravenous Every 8 hours 07/18/22 0842 07/21/22 1357   07/18/22 0800  cefTRIAXone (ROCEPHIN) 1 g in sodium chloride 0.9 % 100 mL IVPB  Status:  Discontinued        1 g 200 mL/hr over 30 Minutes Intravenous Every 24 hours 07/18/22 0205 07/18/22 0829   07/26/2022 1915  vancomycin (VANCOREADY) IVPB 2000 mg/400 mL        2,000 mg 200 mL/hr over 120 Minutes Intravenous  Once 07/22/2022 1914 08/05/2022 2250   07/27/2022 1900  ceFEPIme (MAXIPIME) 2 g in sodium chloride 0.9 % 100 mL IVPB        2 g 200 mL/hr over 30 Minutes Intravenous  Once 08/02/2022 1853 07/18/22 1900   07/15/2022 1900  azithromycin (ZITHROMAX) 500 mg in sodium chloride 0.9 % 250 mL IVPB        500 mg 250 mL/hr over 60 Minutes Intravenous  Once 07/23/2022 1853 07/18/22 1900        I have personally reviewed the following labs and images: CBC: Recent Labs  Lab 07/24/22 0551 07/25/22 0549 07/26/22 0717 07/27/22 0505 07/28/22 0547  WBC 11.0* 11.3* 12.8* 8.7 8.2  HGB 10.5* 10.7* 11.6* 10.7* 10.6*  HCT 37.1 37.3 40.6 36.7 35.6*  MCV 91.4 90.3 90.6 87.8 86.6  PLT 359 373 374 381 343   BMP  &GFR Recent Labs  Lab 07/25/22 0549 07/26/22 0717 07/27/22 0505 07/27/22 0506 07/28/22 0547  NA 136 134* 133* 135 137  K 4.5 4.6 3.8 3.9 3.2*  CL 92* 92* 92* 93* 93*  CO2 '29 29 28 29 30  '$ GLUCOSE 72 84  71 69* 77  BUN 64* 70* 69* 69* 62*  CREATININE 2.63* 2.71* 2.16* 2.06* 1.60*  CALCIUM 8.2* 7.9* 8.1* 8.2* 8.4*  MG 2.5* 2.4 2.5*  --  2.3  PHOS  --   --   --  4.8*  --    Estimated Creatinine Clearance: 47.4 mL/min (A) (by C-G formula based on SCr of 1.6 mg/dL (H)). Liver & Pancreas: Recent Labs  Lab 07/22/22 0541 07/23/22 0324 07/27/22 0506  AST 80* 179*  --   ALT 98* 134*  --   ALKPHOS 132* 137*  --   BILITOT 1.2 1.8*  --   PROT 6.9 6.7  --   ALBUMIN 3.1* 3.0* 3.1*   No results for input(s): "LIPASE", "AMYLASE" in the last 168 hours. No results for input(s): "AMMONIA" in the last 168 hours. Diabetic: No results for input(s): "HGBA1C" in the last 72 hours. Recent Labs  Lab 07/24/22 0332 07/24/22 0538 07/27/22 1226 07/27/22 2342 07/28/22 0739  GLUCAP 71 94 96 88 80   Cardiac Enzymes: No results for input(s): "CKTOTAL", "CKMB", "CKMBINDEX", "TROPONINI" in the last 168 hours. No results for input(s): "PROBNP" in the last 8760 hours. Coagulation Profile: No results for input(s): "INR", "PROTIME" in the last 168 hours. Thyroid Function Tests: No results for input(s): "TSH", "T4TOTAL", "FREET4", "T3FREE", "THYROIDAB" in the last 72 hours. Lipid Profile: No results for input(s): "CHOL", "HDL", "LDLCALC", "TRIG", "CHOLHDL", "LDLDIRECT" in the last 72 hours. Anemia Panel: No results for input(s): "VITAMINB12", "FOLATE", "FERRITIN", "TIBC", "IRON", "RETICCTPCT" in the last 72 hours. Urine analysis:    Component Value Date/Time   COLORURINE AMBER (A) 07/08/2022 2201   APPEARANCEUR CLOUDY (A) 07/08/2022 2201   APPEARANCEUR CLOUDY 01/15/2014 0209   LABSPEC 1.015 07/08/2022 2201   LABSPEC 1.014 01/15/2014 0209   PHURINE 5.0 07/08/2022 2201   GLUCOSEU NEGATIVE  07/08/2022 2201   GLUCOSEU NEGATIVE 01/15/2014 0209   HGBUR MODERATE (A) 07/08/2022 2201   BILIRUBINUR NEGATIVE 07/08/2022 2201   BILIRUBINUR NEGATIVE 01/15/2014 0209   KETONESUR NEGATIVE 07/08/2022 2201   PROTEINUR NEGATIVE 07/08/2022 2201   NITRITE NEGATIVE 07/08/2022 2201   LEUKOCYTESUR LARGE (A) 07/08/2022 2201   LEUKOCYTESUR 2+ 01/15/2014 0209   Sepsis Labs: Invalid input(s): "PROCALCITONIN", "LACTICIDVEN"  Microbiology: Recent Results (from the past 240 hour(s))  Urine Culture     Status: None   Collection Time: 07/23/22  6:00 AM   Specimen: Urine, Clean Catch  Result Value Ref Range Status   Specimen Description   Final    URINE, CLEAN CATCH Performed at [facility], [address] [place], [place] [address]    Special Requests   Final    Normal Performed at [facility], [address] [place], [place] [address]    Culture   Final    NO GROWTH Performed at [facility], [place] [address], [place], [place] [address]    Report Status 07/24/2022 FINAL  Final    Radiology Studies: DG Chest Port 1 View  Result Date: 07/27/2022 CLINICAL DATA:  Congestive failure EXAM: PORTABLE CHEST 1 VIEW COMPARISON:  07/23/2022 FINDINGS: Cardiac shadow is enlarged but stable. Persistent vascular congestion is noted with associated edema. Bilateral effusions are again noted. No bony abnormality is seen. IMPRESSION: No change from the prior exam. Electronically Signed   By: Inez Catalina M.D.   On: 07/27/2022 19:04      Leomia Blake T. Pierre  If 7PM-7AM, please contact night-coverage [email or web address] 07/28/2022, 1:01 PM

## 2022-07-29 DIAGNOSIS — Z7189 Other specified counseling: Secondary | ICD-10-CM

## 2022-07-29 DIAGNOSIS — I5023 Acute on chronic systolic (congestive) heart failure: Secondary | ICD-10-CM

## 2022-07-29 DIAGNOSIS — I480 Paroxysmal atrial fibrillation: Secondary | ICD-10-CM | POA: Diagnosis not present

## 2022-07-29 DIAGNOSIS — N179 Acute kidney failure, unspecified: Secondary | ICD-10-CM

## 2022-07-29 DIAGNOSIS — J9622 Acute and chronic respiratory failure with hypercapnia: Secondary | ICD-10-CM | POA: Diagnosis not present

## 2022-07-29 DIAGNOSIS — J9621 Acute and chronic respiratory failure with hypoxia: Secondary | ICD-10-CM | POA: Diagnosis not present

## 2022-07-29 LAB — CBC
HCT: 37.1 % (ref 36.0–46.0)
Hemoglobin: 10.4 g/dL — ABNORMAL LOW (ref 12.0–15.0)
MCH: 25.2 pg — ABNORMAL LOW (ref 26.0–34.0)
MCHC: 28 g/dL — ABNORMAL LOW (ref 30.0–36.0)
MCV: 90 fL (ref 80.0–100.0)
Platelets: 330 10*3/uL (ref 150–400)
RBC: 4.12 MIL/uL (ref 3.87–5.11)
RDW: 24.1 % — ABNORMAL HIGH (ref 11.5–15.5)
WBC: 8.4 10*3/uL (ref 4.0–10.5)
nRBC: 0 % (ref 0.0–0.2)

## 2022-07-29 LAB — BASIC METABOLIC PANEL
Anion gap: 10 (ref 5–15)
BUN: 51 mg/dL — ABNORMAL HIGH (ref 8–23)
CO2: 35 mmol/L — ABNORMAL HIGH (ref 22–32)
Calcium: 8.4 mg/dL — ABNORMAL LOW (ref 8.9–10.3)
Chloride: 92 mmol/L — ABNORMAL LOW (ref 98–111)
Creatinine, Ser: 1.23 mg/dL — ABNORMAL HIGH (ref 0.44–1.00)
GFR, Estimated: 48 mL/min — ABNORMAL LOW (ref >60)
Glucose, Bld: 91 mg/dL (ref 70–99)
Potassium: 3.1 mmol/L — ABNORMAL LOW (ref 3.5–5.1)
Sodium: 137 mmol/L (ref 135–145)

## 2022-07-29 LAB — GLUCOSE, CAPILLARY
Glucose-Capillary: 133 mg/dL — ABNORMAL HIGH (ref 70–99)
Glucose-Capillary: 89 mg/dL (ref 70–99)

## 2022-07-29 LAB — MAGNESIUM: Magnesium: 2.1 mg/dL (ref 1.7–2.4)

## 2022-07-29 MED ORDER — METOPROLOL TARTRATE 25 MG PO TABS
25.0000 mg | ORAL_TABLET | Freq: Three times a day (TID) | ORAL | Status: DC
  Administered 2022-07-29 – 2022-07-31 (×6): 25 mg via ORAL
  Filled 2022-07-29 (×6): qty 1

## 2022-07-29 MED ORDER — METOPROLOL TARTRATE 5 MG/5ML IV SOLN
2.5000 mg | INTRAVENOUS | Status: DC | PRN
  Administered 2022-07-31: 2.5 mg via INTRAVENOUS
  Filled 2022-07-29: qty 5

## 2022-07-29 MED ORDER — DILTIAZEM HCL 30 MG PO TABS
30.0000 mg | ORAL_TABLET | Freq: Four times a day (QID) | ORAL | Status: DC | PRN
  Administered 2022-07-30 – 2022-07-31 (×3): 30 mg via ORAL
  Filled 2022-07-29 (×3): qty 1

## 2022-07-29 MED ORDER — ENSURE ENLIVE PO LIQD
237.0000 mL | Freq: Two times a day (BID) | ORAL | Status: DC
  Administered 2022-07-29 – 2022-08-12 (×16): 237 mL via ORAL

## 2022-07-29 MED ORDER — POTASSIUM CHLORIDE CRYS ER 20 MEQ PO TBCR
40.0000 meq | EXTENDED_RELEASE_TABLET | ORAL | Status: AC
  Administered 2022-07-29 (×2): 40 meq via ORAL
  Filled 2022-07-29 (×2): qty 2

## 2022-07-29 NOTE — Progress Notes (Signed)
Daily Progress Note   Patient Name: Madison Mcbride       Date: 07/29/2022 DOB: 09-Dec-1955  Age: 67 y.o. MRN#: [id number] Attending Physician: Jennye Boroughs, MD Primary Care Physician: Rusty Aus, MD Admit Date: 07/20/2022  Reason for Consultation/Follow-up: Establishing goals of care  Subjective: Notes and labs reviewed.  In to see patient.  Cognitive status appears to be better today than yesterday.  She denies complaint this morning.  Broached goals of care, and she does not answer questions or engage in conversation regarding them.  Spoke with husband yesterday who desired full code/full scope treatment.  PMT will continue to follow.  Length of Stay: 12  Current Medications: Scheduled Meds:   apixaban  5 mg Oral BID   atorvastatin  20 mg Oral Daily   budesonide (PULMICORT) nebulizer solution  0.25 mg Nebulization BID   buPROPion  150 mg Oral Daily   diltiazem  120 mg Oral Daily   feeding supplement  237 mL Oral BID BM   hydrALAZINE  10 mg Oral Q8H   hydrocerin   Topical BID   ipratropium-albuterol  3 mL Nebulization TID   isosorbide mononitrate  15 mg Oral BID   levothyroxine  250 mcg Oral Q0600   metoprolol tartrate  50 mg Oral TID   midodrine  5 mg Oral TID WC   pantoprazole  40 mg Oral Daily   rOPINIRole  1 mg Oral QHS   torsemide  20 mg Oral Daily    Continuous Infusions:  lactated ringers 40 mL/hr at 07/28/22 1708    PRN Meds: acetaminophen, guaiFENesin, levalbuterol, liver oil-zinc oxide, metoprolol tartrate, ondansetron (ZOFRAN) IV, oxyCODONE, polyethylene glycol, senna-docusate  Physical Exam Pulmonary:     Effort: Pulmonary effort is normal.  Neurological:     Mental Status: She is alert.             Vital Signs: BP (!) 85/70 (BP Location: Left  Leg)   Pulse 69   Temp 98.2 F (36.8 C) (Oral)   Resp 14   Ht '5\' 5"'$  (1.651 m)   Wt (!) 137.5 kg   SpO2 96%   BMI 50.44 kg/m  SpO2: SpO2: 96 % O2 Device: O2 Device: Nasal Cannula O2 Flow Rate: O2 Flow Rate (L/min): 2 L/min  Intake/output summary:  Intake/Output Summary (Last  24 hours) at 07/29/2022 1255 Last data filed at 07/29/2022 1035 Gross per 24 hour  Intake 898 ml  Output 3450 ml  Net -2552 ml   LBM: Last BM Date : 07/25/22 Baseline Weight: Weight: (!) 136.5 kg Most recent weight: Weight: (!) 137.5 kg        Patient Active Problem List   Diagnosis Date Noted   Goals of care, counseling/discussion 07/28/2022   Acute pulmonary edema ([place]) 07/20/2022   HAP (hospital-acquired pneumonia) 07/20/2022   Chronic atrial fibrillation with RVR ([place]) 07/21/2022   Thoracic aortic aneurysm ([place]) 08/02/2022   Hyperkalemia 07/13/2022   Hypotension 08/11/2022   Leukocytosis 07/16/2022   Normocytic anemia 08/11/2022   Paroxysmal atrial fibrillation with RVR ([place]) 07/16/2022   SIRS (systemic inflammatory response syndrome) ([place]) 07/15/2022   Tachycardia 07/15/2022   AKI (acute kidney injury) ([place]) 07/15/2022   UTI (urinary tract infection) 07/02/2022   HTN (hypertension) 07/05/2022   Type II diabetes mellitus with renal manifestations ([place]) 06/30/2022   Depression with anxiety 07/08/2022   Iron deficiency anemia 06/20/2022   Sepsis ([place]) 06/18/2022   Myocardial injury 07/03/2022   Atrial fibrillation with RVR ([place]) 06/30/2022   Acute kidney injury ([place]) 05/20/2022   Chronic hypoxic respiratory failure, on home oxygen therapy ([place]) 05/20/2022   Congestive heart failure (CHF) ([place]) 05/20/2022   Microcytic anemia 05/20/2022   Chronic venous stasis dermatitis of both lower extremities 05/20/2022   Pressure ulcer of buttock 05/20/2022   Inflammatory arthritis 05/20/2022   Sinus bradycardia 05/20/2022   Dysuria 05/20/2022   PAF (paroxysmal atrial fibrillation) ([place]) 03/08/2022    Acute on chronic respiratory failure with hypoxia and hypercapnia (HCC) 03/05/2022   Effusion of knee joint, left 03/22/2021   Acute on chronic systolic CHF (congestive heart failure) ([place]) 03/19/2021   Acute on chronic heart failure ([place]) 03/14/2021   Acute respiratory failure with hypoxia ([place]) 03/14/2021   Diabetes mellitus without complication ([place]) 93/73/4287   HLD (hyperlipidemia) 04/09/2020   Depression 04/09/2020   Elevated troponin 04/09/2020   OSA (obstructive sleep apnea) 04/09/2020   Peripheral polyneuropathy 02/29/2020   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   Lumbar radiculopathy, acute    Acute midline low back pain without sciatica    Type 2 diabetes mellitus with diabetic neuropathy, without long-term current use of insulin ([place]) 02/21/2020   Hypothyroidism 02/21/2020   Morbid obesity with BMI of 50.0-59.9, adult ([place]) 02/21/2020   CAP (community acquired pneumonia) 02/21/2020   Bilateral cellulitis of lower leg 02/21/2020   Acute respiratory failure with hypoxia and hypercapnia ([place]) 02/21/2020   Acute renal failure superimposed on stage 3a chronic kidney disease ([place]) 68/05/5725   Acute metabolic encephalopathy 20/35/5974   Chronic kidney disease, stage 3a ([place]) 01/27/2020   Transaminitis 01/27/2020   Lymphedema of both lower extremities 08/21/2016   Benign essential hypertension 05/18/2016    Palliative Care Assessment & Plan    Recommendations/Plan: Continue full code/full scope treatment.  Code Status:    Code Status Orders  (From admission, onward)           Start     Ordered   07/16/2022 2107  Full code  Continuous       Question:  By:  Answer:  Default: patient does not have capacity for decision making, no surrogate or prior directive available   08/10/2022 2107           Code Status History     Date Active Date Inactive Code Status Order ID  Comments User Context   06/27/2022 0610 07/23/2022 1503 Full  Code [id number]  Sidney Ace Arvella Merles, MD ED   05/20/2022 1733 05/29/2022 2115 Full Code [id number]  Jose Persia, MD ED   03/05/2022 2257 03/19/2022 1729 Full Code [id number]  Lang Snow, NP ED   07/15/2021 2331 07/29/2021 2207 Full Code [id number]  Cox, Amy N, DO ED   03/14/2021 2357 03/24/2021 2129 Full Code [id number]  Clarnce Flock, MD ED   04/09/2020 1838 04/19/2020 0100 Full Code [id number]  Ivor Costa, MD Inpatient   02/21/2020 0600 03/01/2020 2155 Full Code [id number]  Athena Masse, MD ED      Advance Directive Documentation    Flowsheet Row Most Recent Value  Type of Advance Directive Healthcare Power of Attorney  Pre-existing out of facility DNR order (yellow form or pink MOST form) --  "MOST" Form in Place? --    Thank you for allowing the Palliative Medicine Team to assist in the care of this patient.    Asencion Gowda, NP  Please contact Palliative Medicine Team phone at [phone] for questions and concerns.

## 2022-07-29 NOTE — Progress Notes (Signed)
[facility]    SUBJECTIVE: States to be doing reasonably well denies any worsening shortness of breath no chest pain lying in bed not much activity denies fever chills or sweats   Vitals:   07/28/22 2308 07/29/22 0129 07/29/22 0405 07/29/22 0737  BP: (!) '86/74 99/65 97/61 '$   Pulse: 69 86 85   Resp: 18  20   Temp: 97.9 F (36.6 C)  97.6 F (36.4 C)   TempSrc:      SpO2: 92%  94% 99%  Weight:      Height:         Intake/Output Summary (Last 24 hours) at 07/29/2022 0753 Last data filed at 07/29/2022 4696 Gross per 24 hour  Intake 1138 ml  Output 3150 ml  Net -2012 ml      PHYSICAL EXAM  General: Well developed, well nourished, in no acute distress obese lying in bed HEENT:  Normocephalic and atramatic Neck:  No JVD.  Lungs: Clear bilaterally to auscultation and percussion. Heart: HRRR . Normal S1 and S2 without gallops or murmurs.  Abdomen: Bowel sounds are positive, abdomen soft and non-tender  Msk:  Back normal, normal gait. Normal strength and tone for age. Extremities: No clubbing, cyanosis or edema.   Neuro: Alert and oriented X 3. Psych:  Good affect, responds appropriately   LABS: Basic Metabolic Panel: Recent Labs    07/27/22 0506 07/28/22 0547 07/29/22 0626  NA 135 137 137  K 3.9 3.2* 3.1*  CL 93* 93* 92*  CO2 29 30 35*  GLUCOSE 69* 77 91  BUN 69* 62* 51*  CREATININE 2.06* 1.60* 1.23*  CALCIUM 8.2* 8.4* 8.4*  MG  --  2.3 2.1  PHOS 4.8*  --   --    Liver Function Tests: Recent Labs    07/27/22 0506  ALBUMIN 3.1*   No results for input(s): "LIPASE", "AMYLASE" in the last 72 hours. CBC: Recent Labs    07/28/22 0547 07/29/22 0626  WBC 8.2 8.4  HGB 10.6* 10.4*  HCT 35.6* 37.1  MCV 86.6 90.0  PLT 343 330   Cardiac Enzymes: No results for input(s): "CKTOTAL", "CKMB", "CKMBINDEX", "TROPONINI" in the last 72 hours. BNP: Invalid input(s): "POCBNP" D-Dimer: No results for input(s): "DDIMER" in the last 72 hours. Hemoglobin A1C: No  results for input(s): "HGBA1C" in the last 72 hours. Fasting Lipid Panel: No results for input(s): "CHOL", "HDL", "LDLCALC", "TRIG", "CHOLHDL", "LDLDIRECT" in the last 72 hours. Thyroid Function Tests: No results for input(s): "TSH", "T4TOTAL", "T3FREE", "THYROIDAB" in the last 72 hours.  Invalid input(s): "FREET3" Anemia Panel: No results for input(s): "VITAMINB12", "FOLATE", "FERRITIN", "TIBC", "IRON", "RETICCTPCT" in the last 72 hours.  DG Chest Port 1 View  Result Date: 07/27/2022 CLINICAL DATA:  Congestive failure EXAM: PORTABLE CHEST 1 VIEW COMPARISON:  07/23/2022 FINDINGS: Cardiac shadow is enlarged but stable. Persistent vascular congestion is noted with associated edema. Bilateral effusions are again noted. No bony abnormality is seen. IMPRESSION: No change from the prior exam. Electronically Signed   By: Inez Catalina M.D.   On: 07/27/2022 19:04   ECHOCARDIOGRAM COMPLETE  Result Date: 07/27/2022    ECHOCARDIOGRAM REPORT   Patient Name:   Madison Mcbride Date of Exam: 07/27/2022 Medical Rec #:  [id number]             Height:       65.0 in Accession #:    [id number]            Weight:  299.4 lb Date of Birth:  [birth year]             BSA:          2.348 m Patient Age:    67 years              BP:           100/62 mmHg Patient Gender: F                     HR:           100 bpm. Exam Location:  [facility] Procedure: 2D Echo, Cardiac Doppler and Color Doppler Indications:     CHF-acute diastolic V56.43  History:         Patient has prior history of Echocardiogram examinations, most                  recent 03/06/2022. CHF; Risk Factors:Sleep Apnea and Diabetes.  Sonographer:     Sherrie Sport Referring Phys:  [id number] Charlesetta Ivory GONFA Diagnosing Phys: Kathlyn Sacramento MD  Sonographer Comments: Technically challenging study due to limited acoustic windows and no apical window. IMPRESSIONS  1. Left ventricular ejection fraction, by estimation, is 25 to 30%. The left ventricle has severely decreased  function. Left ventricular endocardial border not optimally defined to evaluate regional wall motion. The left ventricular internal cavity size  was mildly dilated. There is mild left ventricular hypertrophy. Left ventricular diastolic parameters are indeterminate.  2. Right ventricular systolic function is moderately reduced. The right ventricular size is normal. There is moderately elevated pulmonary artery systolic pressure. The estimated right ventricular systolic pressure is 41.6 mmHg.  3. Moderate pleural effusion in the left lateral region.  4. The mitral valve is normal in structure. No evidence of mitral valve regurgitation. No evidence of mitral stenosis.  5. The aortic valve is normal in structure. Aortic valve regurgitation is moderate. Aortic valve sclerosis/calcification is present, without any evidence of aortic stenosis.  6. Aortic dilatation noted. 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  7. Moderately dilated pulmonary artery. FINDINGS  Left Ventricle: Left ventricular ejection fraction, by estimation, is 25 to 30%. The left ventricle has severely decreased function. Left ventricular endocardial border not optimally defined to evaluate regional wall motion. The left ventricular internal cavity size was mildly dilated. There is mild left ventricular hypertrophy. Left ventricular diastolic parameters are indeterminate. Right Ventricle: The right ventricular size is normal. No increase in right ventricular wall thickness. Right ventricular systolic function is moderately reduced. There is moderately elevated pulmonary artery systolic pressure. The tricuspid regurgitant velocity is 3.19 m/s, and with an assumed right atrial pressure of 10 mmHg, the estimated right ventricular systolic pressure is 60.6 mmHg. Left Atrium: Left atrial size was normal in size. Right Atrium: Right atrial size was normal in size. Pericardium: There is no evidence of  pericardial effusion. Mitral Valve: The mitral valve is normal in structure. No evidence of mitral valve regurgitation. No evidence of mitral valve stenosis. Tricuspid Valve: The tricuspid valve is normal in structure. Tricuspid valve regurgitation is mild . No evidence of tricuspid stenosis. Aortic Valve: The aortic valve is normal in structure. Aortic valve regurgitation is moderate. Aortic valve sclerosis/calcification is present, without any evidence of aortic stenosis. Pulmonic Valve: The pulmonic valve was normal in structure. Pulmonic valve regurgitation is mild. No evidence of pulmonic stenosis. Aorta: Aortic dilatation noted. There is severe dilatation of the aortic  root and of the ascending aorta, measuring 54 mm. Pulmonary Artery: The pulmonary artery is moderately dilated. Venous: The inferior vena cava was not well visualized. IAS/Shunts: No atrial level shunt detected by color flow Doppler. Additional Comments: There is a moderate pleural effusion in the left lateral region.  LEFT VENTRICLE PLAX 2D LVIDd:         5.80 cm LVIDs:         5.20 cm LV PW:         0.80 cm LV IVS:        1.20 cm LVOT diam:     2.20 cm LVOT Area:     3.80 cm  LEFT ATRIUM         Index LA diam:    2.60 cm 1.11 cm/m   AORTA Ao Root diam: 5.40 cm TRICUSPID VALVE TR Peak grad:   40.7 mmHg TR Vmax:        319.00 cm/s  SHUNTS Systemic Diam: 2.20 cm Kathlyn Sacramento MD Electronically signed by Kathlyn Sacramento MD Signature Date/Time: 07/27/2022/1:22:54 PM    Final      Echo severely depressed left ventricular function EF around 25%  TELEMETRY: Rate controlled 85:  ASSESSMENT AND PLAN:  Principal Problem:   Paroxysmal atrial fibrillation with RVR ([place]) Active Problems:   Type 2 diabetes mellitus with diabetic neuropathy, without long-term current use of insulin (HCC)   Hypothyroidism   Acute metabolic encephalopathy   HLD (hyperlipidemia)   Elevated troponin   OSA (obstructive sleep apnea)   Acute respiratory failure  with hypoxia (HCC)   Acute on chronic systolic CHF (congestive heart failure) (HCC)   Acute on chronic respiratory failure with hypoxia and hypercapnia (HCC)   Thoracic aortic aneurysm (HCC)   Hyperkalemia   Hypotension   Leukocytosis   Normocytic anemia   Acute pulmonary edema (HCC)   HAP (hospital-acquired pneumonia)   Goals of care, counseling/discussion    Plan Hypotention systolic blood pressures initially in the 80s now somewhat improved to 97 Atrial fibrillation paroxysmal continue rate control and anticoagulation Hospital-acquired pneumonia continue broad-spectrum antibiotic therapy Obesity recommend weight loss exercise portion control Obstructive sleep apnea recommend sleep study CPAP be continued on BiPAP Diabetes type 2 uncomplicated continue current management Hyperlipidemia continue statin therapy for lipid management Blood pressure medications because of relative hypotension continue midodrine Continue conservative management Chronic renal insufficiency somewhat improved with modest hydration agree with left large input Yolonda Kida, MD 07/29/2022 7:53 AM

## 2022-07-29 NOTE — Progress Notes (Addendum)
[facility]  ROUNDING NOTE   Subjective:   Madison Mcbride with a past medical history of chronic diastolic heart failure, morbid obesity, hypothyroidism, diabetes mellitus type 2, obstructive sleep apnea, lymphedema, and chronic kidney disease stage IIIa.  Patient presents to the emergency department with complaints of shortness of breath.  She has been admitted for Acute pulmonary edema (HCC) [J81.0] Atrial fibrillation with RVR (HCC) [I48.91] HCAP (healthcare-associated pneumonia) [J18.9] Acute on chronic respiratory failure with hypoxia and hypercapnia ([place]) [Y77.41, J96.22]  Patient is known to our practice and is seen outpatient by Dr. Holley Raring.  Patient was last seen in our office on April 03, 2021.  Patient has been lost to follow-up since that time.    Patient seen laying in bed Alert and oriented to self and place No family at bedside RN assisting with meal set up Reports shortness of breath overnight but states she had a panic attack.    Objective:  Vital signs in last 24 hours:  Temp:  [97.6 F (36.4 C)-98.3 F (36.8 C)] 98.2 F (36.8 C) (01/17 0833) Pulse Rate:  [55-114] 114 (01/17 0833) Resp:  [14-20] 14 (01/17 0833) BP: (86-123)/(61-75) 123/63 (01/17 0833) SpO2:  [82 %-99 %] 99 % (01/17 0833) FiO2 (%):  [30 %] 30 % (01/16 2315)  Weight change:  Filed Weights   07/22/22 0321 07/23/22 0500 07/28/22 0904  Weight: (!) 140 kg 135.8 kg (!) 137.5 kg    Intake/Output: I/O last 3 completed shifts: In: 1823.3 [P.O.:1098; I.V.:725.3] Out: 6400 [Urine:6400]   Intake/Output this shift:  No intake/output data recorded.  Physical Exam: General: NAD  Head: Normocephalic, atraumatic. Dry oral mucosal membranes  Eyes: Anicteric  Lungs:  Clear to auscultation, normal effort, room air  Heart: Regular rate and rhythm  Abdomen:  Soft, nontender, obese  Extremities: Trace to 1+ peripheral edema.  Neurologic: Alert, myoclonic jerking present  Skin:  No lesions  Access: None    Basic Metabolic Panel: Recent Labs  Lab 07/25/22 0549 07/26/22 0717 07/27/22 0505 07/27/22 0506 07/28/22 0547 07/29/22 0626  NA 136 134* 133* 135 137 137  K 4.5 4.6 3.8 3.9 3.2* 3.1*  CL 92* 92* 92* 93* 93* 92*  CO2 '29 29 28 29 30 '$ 35*  GLUCOSE 72 84 71 69* 77 91  BUN 64* 70* 69* 69* 62* 51*  CREATININE 2.63* 2.71* 2.16* 2.06* 1.60* 1.23*  CALCIUM 8.2* 7.9* 8.1* 8.2* 8.4* 8.4*  MG 2.5* 2.4 2.5*  --  2.3 2.1  PHOS  --   --   --  4.8*  --   --      Liver Function Tests: Recent Labs  Lab 07/23/22 0324 07/27/22 0506  AST 179*  --   ALT 134*  --   ALKPHOS 137*  --   BILITOT 1.8*  --   PROT 6.7  --   ALBUMIN 3.0* 3.1*    No results for input(s): "LIPASE", "AMYLASE" in the last 168 hours. No results for input(s): "AMMONIA" in the last 168 hours.  CBC: Recent Labs  Lab 07/25/22 0549 07/26/22 0717 07/27/22 0505 07/28/22 0547 07/29/22 0626  WBC 11.3* 12.8* 8.7 8.2 8.4  HGB 10.7* 11.6* 10.7* 10.6* 10.4*  HCT 37.3 40.6 36.7 35.6* 37.1  MCV 90.3 90.6 87.8 86.6 90.0  PLT 373 374 381 343 330     Cardiac Enzymes: No results for input(s): "CKTOTAL", "CKMB", "CKMBINDEX", "TROPONINI" in the last 168 hours.  BNP: Invalid input(s): "POCBNP"  CBG: Recent Labs  Lab  07/28/22 0739 07/28/22 1621 07/28/22 2325 07/29/22 0052 07/29/22 0835  GLUCAP 80 160* 175* 133* 8     Microbiology: Results for orders placed or performed during the hospital encounter of 07/19/2022  Blood culture (routine x 2)     Status: None   Collection Time: 08/02/2022 11:01 PM   Specimen: BLOOD  Result Value Ref Range Status   Specimen Description BLOOD BLOOD RIGHT WRIST  Final   Special Requests   Final    BOTTLES DRAWN AEROBIC AND ANAEROBIC Blood Culture results may not be optimal due to an inadequate volume of blood received in culture bottles   Culture   Final    NO GROWTH 5 DAYS Performed at [facility], [address] [place], [place]  [address]    Report Status 07/22/2022 FINAL  Final  Blood culture (routine x 2)     Status: None   Collection Time: 07/31/2022 11:01 PM   Specimen: BLOOD  Result Value Ref Range Status   Specimen Description BLOOD BLOOD LEFT HAND  Final   Special Requests IN PEDIATRIC BOTTLE Blood Culture adequate volume  Final   Culture   Final    NO GROWTH 5 DAYS Performed at [facility], [address], [place], [place] [address]    Report Status 07/22/2022 FINAL  Final  MRSA Next Gen by PCR, Nasal     Status: None   Collection Time: 07/18/22 10:11 AM   Specimen: Nasal Mucosa; Nasal Swab  Result Value Ref Range Status   MRSA by PCR Next Gen NOT DETECTED NOT DETECTED Final    Comment: (NOTE) The GeneXpert MRSA Assay (FDA approved for NASAL specimens only), is one component of a comprehensive MRSA colonization surveillance program. It is not intended to diagnose MRSA infection nor to guide or monitor treatment for MRSA infections. Test performance is not FDA approved in patients less than 65 years old. Performed at [facility], [place], [place] [address]   Respiratory (~20 pathogens) panel by PCR     Status: None   Collection Time: 07/18/22 11:07 AM   Specimen: Nasopharyngeal Swab; Respiratory  Result Value Ref Range Status   Adenovirus NOT DETECTED NOT DETECTED Final   Coronavirus 229E NOT DETECTED NOT DETECTED Final    Comment: (NOTE) The Coronavirus on the Respiratory Panel, DOES NOT test for the novel  Coronavirus (2019 nCoV)    Coronavirus HKU1 NOT DETECTED NOT DETECTED Final   Coronavirus NL63 NOT DETECTED NOT DETECTED Final   Coronavirus OC43 NOT DETECTED NOT DETECTED Final   Metapneumovirus NOT DETECTED NOT DETECTED Final   Rhinovirus / Enterovirus NOT DETECTED NOT DETECTED Final   Influenza A NOT DETECTED NOT DETECTED Final   Influenza B NOT DETECTED NOT DETECTED Final   Parainfluenza Virus 1 NOT DETECTED NOT DETECTED Final   Parainfluenza Virus  2 NOT DETECTED NOT DETECTED Final   Parainfluenza Virus 3 NOT DETECTED NOT DETECTED Final   Parainfluenza Virus 4 NOT DETECTED NOT DETECTED Final   Respiratory Syncytial Virus NOT DETECTED NOT DETECTED Final   Bordetella pertussis NOT DETECTED NOT DETECTED Final   Bordetella Parapertussis NOT DETECTED NOT DETECTED Final   Chlamydophila pneumoniae NOT DETECTED NOT DETECTED Final   Mycoplasma pneumoniae NOT DETECTED NOT DETECTED Final    Comment: Performed at [facility], [place]. [address] [place], [place] [address]  Urine Culture     Status: None   Collection Time: 07/23/22  6:00 AM   Specimen: Urine, Clean  Catch  Result Value Ref Range Status   Specimen Description   Final    URINE, CLEAN CATCH Performed at [facility], [address], [place], [place] [address]    Special Requests   Final    Normal Performed at [facility], [address] [place], [place] [address]    Culture   Final    NO GROWTH Performed at [facility], [place] [address] [place], [place] [address]    Report Status 07/24/2022 FINAL  Final    Coagulation Studies: No results for input(s): "LABPROT", "INR" in the last 72 hours.  Urinalysis: No results for input(s): "COLORURINE", "LABSPEC", "PHURINE", "GLUCOSEU", "HGBUR", "BILIRUBINUR", "KETONESUR", "PROTEINUR", "UROBILINOGEN", "NITRITE", "LEUKOCYTESUR" in the last 72 hours.  Invalid input(s): "APPERANCEUR"    Imaging: DG Chest Port 1 View  Result Date: 07/27/2022 CLINICAL DATA:  Congestive failure EXAM: PORTABLE CHEST 1 VIEW COMPARISON:  07/23/2022 FINDINGS: Cardiac shadow is enlarged but stable. Persistent vascular congestion is noted with associated edema. Bilateral effusions are again noted. No bony abnormality is seen. IMPRESSION: No change from the prior exam. Electronically Signed   By: Inez Catalina M.D.   On: 07/27/2022 19:04   ECHOCARDIOGRAM COMPLETE  Result Date: 07/27/2022    ECHOCARDIOGRAM REPORT   Patient Name:    Madison Mcbride Date of Exam: 07/27/2022 Medical Rec #:  [id number]             Height:       65.0 in Accession #:    [id number]            Weight:       299.4 lb Date of Birth:  04/04/1956             BSA:          2.348 m Patient Age:    53 years              BP:           100/62 mmHg Patient Gender: F                     HR:           100 bpm. Exam Location:  [facility] Procedure: 2D Echo, Cardiac Doppler and Color Doppler Indications:     CHF-acute diastolic I95.18  History:         Patient has prior history of Echocardiogram examinations, most                  recent 03/06/2022. CHF; Risk Factors:Sleep Apnea and Diabetes.  Sonographer:     Sherrie Sport Referring Phys:  [id number] Charlesetta Ivory GONFA Diagnosing Phys: Kathlyn Sacramento MD  Sonographer Comments: Technically challenging study due to limited acoustic windows and no apical window. IMPRESSIONS  1. Left ventricular ejection fraction, by estimation, is 25 to 30%. The left ventricle has severely decreased function. Left ventricular endocardial border not optimally defined to evaluate regional wall motion. The left ventricular internal cavity size  was mildly dilated. There is mild left ventricular hypertrophy. Left ventricular diastolic parameters are indeterminate.  2. Right ventricular systolic function is moderately reduced. The right ventricular size is normal. There is moderately elevated pulmonary artery systolic pressure. The estimated right ventricular systolic pressure is 30.1 mmHg.  3. Moderate pleural effusion in the left lateral region.  4. The mitral valve is normal in structure. No evidence of mitral valve regurgitation. No evidence of mitral stenosis.  5. The aortic valve  is normal in structure. Aortic valve regurgitation is moderate. Aortic valve sclerosis/calcification is present, without any evidence of aortic stenosis.  6. Aortic dilatation noted. 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  7. Moderately  dilated pulmonary artery. FINDINGS  Left Ventricle: Left ventricular ejection fraction, by estimation, is 25 to 30%. The left ventricle has severely decreased function. Left ventricular endocardial border not optimally defined to evaluate regional wall motion. The left ventricular internal cavity size was mildly dilated. There is mild left ventricular hypertrophy. Left ventricular diastolic parameters are indeterminate. Right Ventricle: The right ventricular size is normal. No increase in right ventricular wall thickness. Right ventricular systolic function is moderately reduced. There is moderately elevated pulmonary artery systolic pressure. The tricuspid regurgitant velocity is 3.19 m/s, and with an assumed right atrial pressure of 10 mmHg, the estimated right ventricular systolic pressure is 62.1 mmHg. Left Atrium: Left atrial size was normal in size. Right Atrium: Right atrial size was normal in size. Pericardium: There is no evidence of pericardial effusion. Mitral Valve: The mitral valve is normal in structure. No evidence of mitral valve regurgitation. No evidence of mitral valve stenosis. Tricuspid Valve: The tricuspid valve is normal in structure. Tricuspid valve regurgitation is mild . No evidence of tricuspid stenosis. Aortic Valve: The aortic valve is normal in structure. Aortic valve regurgitation is moderate. Aortic valve sclerosis/calcification is present, without any evidence of aortic stenosis. Pulmonic Valve: The pulmonic valve was normal in structure. Pulmonic valve regurgitation is mild. No evidence of pulmonic stenosis. Aorta: Aortic dilatation noted. 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 Pulmonary Artery: The pulmonary artery is moderately dilated. Venous: The inferior vena cava was not well visualized. IAS/Shunts: No atrial level shunt detected by color flow Doppler. Additional Comments: There is a moderate pleural effusion in the left  lateral region.  LEFT VENTRICLE PLAX 2D LVIDd:         5.80 cm LVIDs:         5.20 cm LV PW:         0.80 cm LV IVS:        1.20 cm LVOT diam:     2.20 cm LVOT Area:     3.80 cm  LEFT ATRIUM         Index LA diam:    2.60 cm 1.11 cm/m   AORTA Ao Root diam: 5.40 cm TRICUSPID VALVE TR Peak grad:   40.7 mmHg TR Vmax:        319.00 cm/s  SHUNTS Systemic Diam: 2.20 cm Kathlyn Sacramento MD Electronically signed by Kathlyn Sacramento MD Signature Date/Time: 07/27/2022/1:22:54 PM    Final      Medications:    lactated ringers 40 mL/hr at 07/28/22 1708    apixaban  5 mg Oral BID   atorvastatin  20 mg Oral Daily   budesonide (PULMICORT) nebulizer solution  0.25 mg Nebulization BID   buPROPion  150 mg Oral Daily   diltiazem  120 mg Oral Daily   hydrALAZINE  10 mg Oral Q8H   hydrocerin   Topical BID   ipratropium-albuterol  3 mL Nebulization TID   isosorbide mononitrate  15 mg Oral BID   levothyroxine  250 mcg Oral Q0600   metoprolol tartrate  50 mg Oral TID   midodrine  5 mg Oral TID WC   pantoprazole  40 mg Oral Daily   potassium chloride  40  mEq Oral Q4H   rOPINIRole  1 mg Oral QHS   torsemide  20 mg Oral Daily   acetaminophen, guaiFENesin, levalbuterol, liver oil-zinc oxide, metoprolol tartrate, ondansetron (ZOFRAN) IV, oxyCODONE, polyethylene glycol, senna-docusate  Assessment/ Plan:  Ms. Madison Mcbride is a 67 y.o.  female  past medical history of chronic diastolic heart failure, morbid obesity, hypothyroidism, diabetes mellitus type 2, obstructive sleep apnea, lymphedema, and chronic kidney disease stage IIIa.  Patient presents to the emergency department with complaints of shortness of breath.  She has been admitted for Acute pulmonary edema (HCC) [J81.0] Atrial fibrillation with RVR (HCC) [I48.91] HCAP (healthcare-associated pneumonia) [J18.9] Acute on chronic respiratory failure with hypoxia and hypercapnia (HCC) [S06.30, J96.22]   Acute Kidney Injury on chronic kidney disease stage  IIIa with baseline creatinine 1.35 and GFR of 54 on 07/16/22.  Acute kidney injury appears multifactorial due to IV contrast exposure and aggressive diuresis.  IV contrast exposure noted on 07/26/2022.  Renal ultrasound negative for obstruction.    Renal function has returned to baseline. Adequate urine output recorded. Will continue fluids for now. Patient encouraged to increase oral intake. Will order Ensure supplements twice daily.   Lab Results  Component Value Date   CREATININE 1.23 (H) 07/29/2022   CREATININE 1.60 (H) 07/28/2022   CREATININE 2.06 (H) 07/27/2022    Intake/Output Summary (Last 24 hours) at 07/29/2022 0935 Last data filed at 07/29/2022 0615 Gross per 24 hour  Intake 898 ml  Output 2750 ml  Net -1852 ml    2.  Acute on chronic systolic heart failure.  Echo completed on 03/06/2022 shows EF 45 to 50% with a mild to moderate MVR, TVR, and AVR.  Cardiology following. Continue Torsemide daily.   3. Anemia of chronic kidney disease  Normocytic Lab Results  Component Value Date   HGB 10.4 (L) 07/29/2022  Hemoglobin acceptable. Will continue to monitor.  4.  Hypertension with chronic kidney disease. Receiving diltiazem, hydralazine, isosorbide, Torsemide, and metoprolol.  Blood pressure soft at times. Also prescribed Midodrine '5mg'$  TID    LOS: 12   1/17/20249:35 AM

## 2022-07-29 NOTE — Plan of Care (Signed)

## 2022-07-29 NOTE — Plan of Care (Signed)
  Problem: Education: ?Goal: Knowledge of General Education information will improve ?Description: Including pain rating scale, medication(s)/side effects and non-pharmacologic comfort measures ?Outcome: Progressing ?  ?Problem: Clinical Measurements: ?Goal: Will remain free from infection ?Outcome: Progressing ?  ?Problem: Clinical Measurements: ?Goal: Respiratory complications will improve ?Outcome: Progressing ?  ?Problem: Clinical Measurements: ?Goal: Cardiovascular complication will be avoided ?Outcome: Progressing ?  ?Problem: Pain Managment: ?Goal: General experience of comfort will improve ?Outcome: Progressing ?  ?Problem: Safety: ?Goal: Ability to remain free from injury will improve ?Outcome: Progressing ?  ?

## 2022-07-29 NOTE — Progress Notes (Addendum)
Progress Note    Madison Mcbride  NLG:[id number] DOB: [birth year]  DOA: 07/16/2022 PCP: Rusty Aus, MD      Brief Narrative:    Medical records reviewed and are as summarized below:  Madison Mcbride is a 67 y.o. female with medical history significant for morbid obesity, chronic diastolic CHF, hypothyroidism, type II DM, obstructive sleep apnea, lymphedema, CKD stage IIIa, OSA, chronic hypoxic and hypercapnic respiratory failure on 2 L/min oxygen and trilogy machine at night.       Assessment/Plan:   Principal Problem:   Paroxysmal atrial fibrillation with RVR (HCC) Active Problems:   Acute on chronic respiratory failure with hypoxia and hypercapnia (HCC)   Acute metabolic encephalopathy   Acute on chronic systolic CHF (congestive heart failure) (HCC)   HLD (hyperlipidemia)   OSA (obstructive sleep apnea)   Type 2 diabetes mellitus with diabetic neuropathy, without long-term current use of insulin (HCC)   Elevated troponin   Hypothyroidism   Acute respiratory failure with hypoxia (HCC)   AKI (acute kidney injury) ([place])   Thoracic aortic aneurysm (HCC)   Hyperkalemia   Hypotension   Leukocytosis   Normocytic anemia   Acute pulmonary edema (HCC)   HAP (hospital-acquired pneumonia)   Goals of care, counseling/discussion   Body mass index is 50.44 kg/m.  (Morbid obesity)   Acute on chronic systolic CHF, pulmonary hypertension: Improved.  Torsemide has been held because of volume depletion. 2D echo showed EF estimated at 25 to 30%, RVSP 50.7 mmHg, BNP 2,302   AKI on CKD stage IIIa: Nephrologist recommended continuation of IV Ringer's lactate infusion at 40 mL/h.  Monitor BMP and follow-up with nephrologist.   Acute on chronic hypoxic and hypercapnic respiratory failure, OHS, OSA: She is on 4 L/min oxygen.  She uses 2 L/min oxygen at baseline.  Continue BiPAP at night.   Atrial fibrillation with RVR: Continue metoprolol and  apixaban   Hypokalemia: Replete potassium and monitor levels  Acute toxic metabolic encephalopathy: Mental status has improved.  Multifactorial including hypercapnia, uremia and required. Requip dose has been decreased.  Recent hypoglycemia: Resolved   Other comorbidities include type II DM, restless leg syndrome, hypothyroidism, chronic venous stasis, anxiety, depression, chronic pain    Diet Order             Diet 2 gram sodium Room service appropriate? No; Fluid consistency: Thin  Diet effective now                            Consultants: Nephrologist Cardiologist  Procedures: None    Medications:    apixaban  5 mg Oral BID   atorvastatin  20 mg Oral Daily   budesonide (PULMICORT) nebulizer solution  0.25 mg Nebulization BID   buPROPion  150 mg Oral Daily   feeding supplement  237 mL Oral BID BM   hydrALAZINE  10 mg Oral Q8H   hydrocerin   Topical BID   ipratropium-albuterol  3 mL Nebulization TID   isosorbide mononitrate  15 mg Oral BID   levothyroxine  250 mcg Oral Q0600   metoprolol tartrate  25 mg Oral TID   midodrine  5 mg Oral TID WC   pantoprazole  40 mg Oral Daily   rOPINIRole  1 mg Oral QHS   Continuous Infusions:  lactated ringers 40 mL/hr at 07/29/22 1723     Anti-infectives (From admission, onward)    Start  Dose/Rate Route Frequency Ordered Stop   07/25/22 1200  amoxicillin-clavulanate (AUGMENTIN) 500-125 MG per tablet 1 tablet        1 tablet Oral 2 times daily 07/25/22 1102 07/26/22 2117   07/24/22 1400  Ampicillin-Sulbactam (UNASYN) 3 g in sodium chloride 0.9 % 100 mL IVPB  Status:  Discontinued        3 g 200 mL/hr over 30 Minutes Intravenous Every 6 hours 07/24/22 1228 07/24/22 1231   07/24/22 1330  amoxicillin-clavulanate (AUGMENTIN) 875-125 MG per tablet 1 tablet  Status:  Discontinued        1 tablet Oral Every 12 hours 07/24/22 1231 07/25/22 1102   07/21/22 1500  Ampicillin-Sulbactam (UNASYN) 3 g in sodium  chloride 0.9 % 100 mL IVPB        3 g 200 mL/hr over 30 Minutes Intravenous Every 6 hours 07/21/22 1357 07/22/22 2216   07/18/22 1900  azithromycin (ZITHROMAX) 500 mg in sodium chloride 0.9 % 250 mL IVPB  Status:  Discontinued        500 mg 250 mL/hr over 60 Minutes Intravenous Every 24 hours 07/18/22 0205 07/19/22 1204   07/18/22 1000  piperacillin-tazobactam (ZOSYN) IVPB 3.375 g  Status:  Discontinued        3.375 g 12.5 mL/hr over 240 Minutes Intravenous Every 8 hours 07/18/22 0842 07/21/22 1357   07/18/22 0800  cefTRIAXone (ROCEPHIN) 1 g in sodium chloride 0.9 % 100 mL IVPB  Status:  Discontinued        1 g 200 mL/hr over 30 Minutes Intravenous Every 24 hours 07/18/22 0205 07/18/22 0829   08/07/2022 1915  vancomycin (VANCOREADY) IVPB 2000 mg/400 mL        2,000 mg 200 mL/hr over 120 Minutes Intravenous  Once 07/23/2022 1914 07/19/2022 2250   07/20/2022 1900  ceFEPIme (MAXIPIME) 2 g in sodium chloride 0.9 % 100 mL IVPB        2 g 200 mL/hr over 30 Minutes Intravenous  Once 07/26/2022 1853 07/18/22 1900   08/03/2022 1900  azithromycin (ZITHROMAX) 500 mg in sodium chloride 0.9 % 250 mL IVPB        500 mg 250 mL/hr over 60 Minutes Intravenous  Once 08/07/2022 1853 07/18/22 1900              Family Communication/Anticipated D/C date and plan/Code Status   DVT prophylaxis:  apixaban (ELIQUIS) tablet 5 mg     Code Status: Full Code  Family Communication: None Disposition Plan: Plan to discharge to nursing home in 1 to 2 days   Status is: Inpatient Remains inpatient appropriate because: Monitor kidney function       Subjective:   Interval events noted.  No shortness of breath or chest pain.  She feels better today.  Objective:    Vitals:   07/29/22 0737 07/29/22 0833 07/29/22 1206 07/29/22 1640  BP:  123/63 (!) 85/70 112/84  Pulse:  (!) 114 69 (!) 116  Resp:  '14 14 16  '$ Temp:  98.2 F (36.8 C) 98.2 F (36.8 C) 98.2 F (36.8 C)  TempSrc:  Oral Oral Oral  SpO2: 99% 99%  96% 91%  Weight:      Height:       No data found.   Intake/Output Summary (Last 24 hours) at 07/29/2022 1729 Last data filed at 07/29/2022 1715 Gross per 24 hour  Intake 1538 ml  Output 2700 ml  Net -1162 ml   Filed Weights   07/22/22 0321 07/23/22 0500 07/28/22 6073  Weight: (!) 140 kg 135.8 kg (!) 137.5 kg    Exam:  GEN: NAD SKIN: No rash EYES: EOMI ENT: MMM CV: RRR PULM: CTA B ABD: soft, obese, NT, +BS CNS: AAO x 3, non focal EXT: No edema or tenderness        Data Reviewed:   I have personally reviewed following labs and imaging studies:  Labs: Labs show the following:   Basic Metabolic Panel: Recent Labs  Lab 07/25/22 0549 07/26/22 0717 07/27/22 0505 07/27/22 0506 07/28/22 0547 07/29/22 0626  NA 136 134* 133* 135 137 137  K 4.5 4.6 3.8 3.9 3.2* 3.1*  CL 92* 92* 92* 93* 93* 92*  CO2 '29 29 28 29 30 '$ 35*  GLUCOSE 72 84 71 69* 77 91  BUN 64* 70* 69* 69* 62* 51*  CREATININE 2.63* 2.71* 2.16* 2.06* 1.60* 1.23*  CALCIUM 8.2* 7.9* 8.1* 8.2* 8.4* 8.4*  MG 2.5* 2.4 2.5*  --  2.3 2.1  PHOS  --   --   --  4.8*  --   --    GFR Estimated Creatinine Clearance: 61.6 mL/min (A) (by C-G formula based on SCr of 1.23 mg/dL (H)). Liver Function Tests: Recent Labs  Lab 07/23/22 0324 07/27/22 0506  AST 179*  --   ALT 134*  --   ALKPHOS 137*  --   BILITOT 1.8*  --   PROT 6.7  --   ALBUMIN 3.0* 3.1*   No results for input(s): "LIPASE", "AMYLASE" in the last 168 hours. No results for input(s): "AMMONIA" in the last 168 hours. Coagulation profile No results for input(s): "INR", "PROTIME" in the last 168 hours.  CBC: Recent Labs  Lab 07/25/22 0549 07/26/22 0717 07/27/22 0505 07/28/22 0547 07/29/22 0626  WBC 11.3* 12.8* 8.7 8.2 8.4  HGB 10.7* 11.6* 10.7* 10.6* 10.4*  HCT 37.3 40.6 36.7 35.6* 37.1  MCV 90.3 90.6 87.8 86.6 90.0  PLT 373 374 381 343 330   Cardiac Enzymes: No results for input(s): "CKTOTAL", "CKMB", "CKMBINDEX", "TROPONINI" in the  last 168 hours. BNP (last 3 results) No results for input(s): "PROBNP" in the last 8760 hours. CBG: Recent Labs  Lab 07/28/22 0739 07/28/22 1621 07/28/22 2325 07/29/22 0052 07/29/22 0835  GLUCAP 80 160* 175* 133* 89   D-Dimer: No results for input(s): "DDIMER" in the last 72 hours. Hgb A1c: No results for input(s): "HGBA1C" in the last 72 hours. Lipid Profile: No results for input(s): "CHOL", "HDL", "LDLCALC", "TRIG", "CHOLHDL", "LDLDIRECT" in the last 72 hours. Thyroid function studies: No results for input(s): "TSH", "T4TOTAL", "T3FREE", "THYROIDAB" in the last 72 hours.  Invalid input(s): "FREET3" Anemia work up: No results for input(s): "VITAMINB12", "FOLATE", "FERRITIN", "TIBC", "IRON", "RETICCTPCT" in the last 72 hours. Sepsis Labs: Recent Labs  Lab 07/26/22 0717 07/27/22 0505 07/28/22 0547 07/29/22 0626  WBC 12.8* 8.7 8.2 8.4    Microbiology Recent Results (from the past 240 hour(s))  Urine Culture     Status: None   Collection Time: 07/23/22  6:00 AM   Specimen: Urine, Clean Catch  Result Value Ref Range Status   Specimen Description   Final    URINE, CLEAN CATCH Performed at [facility], [address], [place], [place] [address]    Special Requests   Final    Normal Performed at [facility], [address] [place], [place] [address]    Culture   Final    NO GROWTH Performed at [facility], [place],  [place] [address]    Report Status 07/24/2022 FINAL  Final    Procedures and diagnostic studies:  No results found.             LOS: 12 days   [place] Copywriter, advertising on [email or web address]. If 7PM-7AM, please contact night-coverage at [email or web address]     07/29/2022, 5:29 PM

## 2022-07-29 NOTE — Progress Notes (Addendum)
Pt refused BIPAP at this time but was educated about its importance. Will continue to monitor.  Update 0258: Pt requested to placed BIPAP on. BIPAP was placed back on pt. Will continue to monitor.  Update 0514: Pt BP at 97/61 MAP 73 HR 100. Pt is on scheduled hydralazine 10 mg at 0600. MD Mansy made aware. Will continue to monitor.  Update 0527: Per MD Mansy to hold hydralazine at this time. Will continue to monitor.

## 2022-07-30 DIAGNOSIS — Z7189 Other specified counseling: Secondary | ICD-10-CM | POA: Diagnosis not present

## 2022-07-30 DIAGNOSIS — I480 Paroxysmal atrial fibrillation: Secondary | ICD-10-CM | POA: Diagnosis not present

## 2022-07-30 DIAGNOSIS — J9621 Acute and chronic respiratory failure with hypoxia: Secondary | ICD-10-CM | POA: Diagnosis not present

## 2022-07-30 DIAGNOSIS — N179 Acute kidney failure, unspecified: Secondary | ICD-10-CM | POA: Diagnosis not present

## 2022-07-30 DIAGNOSIS — J9622 Acute and chronic respiratory failure with hypercapnia: Secondary | ICD-10-CM | POA: Diagnosis not present

## 2022-07-30 LAB — CBC
HCT: 37.6 % (ref 36.0–46.0)
Hemoglobin: 10.8 g/dL — ABNORMAL LOW (ref 12.0–15.0)
MCH: 25.6 pg — ABNORMAL LOW (ref 26.0–34.0)
MCHC: 28.7 g/dL — ABNORMAL LOW (ref 30.0–36.0)
MCV: 89.1 fL (ref 80.0–100.0)
Platelets: 333 10*3/uL (ref 150–400)
RBC: 4.22 MIL/uL (ref 3.87–5.11)
RDW: 23.7 % — ABNORMAL HIGH (ref 11.5–15.5)
WBC: 9.7 10*3/uL (ref 4.0–10.5)
nRBC: 0 % (ref 0.0–0.2)

## 2022-07-30 LAB — GLUCOSE, CAPILLARY
Glucose-Capillary: 108 mg/dL — ABNORMAL HIGH (ref 70–99)
Glucose-Capillary: 142 mg/dL — ABNORMAL HIGH (ref 70–99)

## 2022-07-30 LAB — BASIC METABOLIC PANEL
Anion gap: 9 (ref 5–15)
BUN: 40 mg/dL — ABNORMAL HIGH (ref 8–23)
CO2: 38 mmol/L — ABNORMAL HIGH (ref 22–32)
Calcium: 8.2 mg/dL — ABNORMAL LOW (ref 8.9–10.3)
Chloride: 89 mmol/L — ABNORMAL LOW (ref 98–111)
Creatinine, Ser: 1 mg/dL (ref 0.44–1.00)
GFR, Estimated: >60 mL/min (ref >60)
Glucose, Bld: 131 mg/dL — ABNORMAL HIGH (ref 70–99)
Potassium: 3 mmol/L — ABNORMAL LOW (ref 3.5–5.1)
Sodium: 136 mmol/L (ref 135–145)

## 2022-07-30 LAB — MAGNESIUM: Magnesium: 1.8 mg/dL (ref 1.7–2.4)

## 2022-07-30 MED ORDER — IPRATROPIUM-ALBUTEROL 0.5-2.5 (3) MG/3ML IN SOLN
3.0000 mL | Freq: Two times a day (BID) | RESPIRATORY_TRACT | Status: DC
  Administered 2022-07-30 – 2022-08-12 (×25): 3 mL via RESPIRATORY_TRACT
  Filled 2022-07-30 (×26): qty 3

## 2022-07-30 MED ORDER — POTASSIUM CHLORIDE CRYS ER 20 MEQ PO TBCR
40.0000 meq | EXTENDED_RELEASE_TABLET | Freq: Three times a day (TID) | ORAL | Status: AC
  Administered 2022-07-30 (×3): 40 meq via ORAL
  Filled 2022-07-30 (×3): qty 2

## 2022-07-30 NOTE — Progress Notes (Signed)
Pt is tachycardiac and refused to take her scheduled and PRN medication due to confusion. The hospitalist on call and charge RN were notified of the yellow MEWS. RN contacted pt's family to come comfort pt. Pt aggred to take her medication after encouragement of family, which improved her tachycardia. Will continue the yellow MEWS protocol  07/30/22 2000  Assess: MEWS Score  Temp 98.2 F (36.8 C)  BP 103/84  MAP (mmHg) 91  Pulse Rate (!) 127  ECG Heart Rate (!) 128  Resp (!) 21  Level of Consciousness Alert  SpO2 93 %  O2 Device Nasal Cannula  Patient Activity (if Appropriate) In bed  O2 Flow Rate (L/min) 3 L/min  Assess: MEWS Score  MEWS Temp 0  MEWS Systolic 0  MEWS Pulse 2  MEWS RR 1  MEWS LOC 0  MEWS Score 3  MEWS Score Color Yellow  Assess: if the MEWS score is Yellow or Red  Were vital signs taken at a resting state? Yes  Focused Assessment No change from prior assessment  Does the patient meet 2 or more of the SIRS criteria? No  Does the patient have a confirmed or suspected source of infection? Yes  Provider and Rapid Response Notified? No  MEWS guidelines implemented *See Row Information* No, previously yellow, continue vital signs every 4 hours  Treat  MEWS Interventions Administered scheduled meds/treatments;Administered prn meds/treatments  Pain Scale PAINAD  Breathing 1  Negative Vocalization 1  Facial Expression 1  Body Language 1  Consolability 2  PAINAD Score 6  Complains of Anxiety;Agitation;Restless  Interventions Medication (see MAR)  Neuro symptoms relieved by Rest;Relaxation techniques (Comment);Other (Comment) (family came)  Patients response to intervention Effective  Take Vital Signs  Increase Vital Sign Frequency  Yellow: Q 2hr X 2 then Q 4hr X 2, if remains yellow, continue Q 4hrs  Escalate  MEWS: Escalate Yellow: discuss with charge nurse/RN and consider discussing with provider and RRT  Notify: Charge Nurse/RN  Name of Charge Nurse/RN  Notified [place], RN  Date Charge Nurse/RN Notified 07/30/22  Time Charge Nurse/RN Notified 2010  Provider Notification  Provider Name/Title Velia Meyer  Date Provider Notified 07/30/22  Time Provider Notified 2010  Method of Notification Page  Notification Reason Other (Comment) (Yellow MEWS)  Provider response No new orders  Date of Provider Response 07/30/22  Time of Provider Response 2017  Notify: Rapid Response  Name of Rapid Response RN Notified  (No need)  Document  Patient Outcome Stabilized after interventions  Assess: SIRS CRITERIA  SIRS Temperature  0  SIRS Pulse 1  SIRS Respirations  1  SIRS WBC 0  SIRS Score Sum  2

## 2022-07-30 NOTE — Progress Notes (Addendum)
Daily Progress Note   Patient Name: Madison Mcbride       Date: 07/30/2022 DOB: [birth year]  Age: 67 y.o. MRN#: [id number] Attending Physician: Jennye Boroughs, MD Primary Care Physician: Rusty Aus, MD Admit Date: 08/09/2022  Reason for Consultation/Follow-up: Establishing goals of care  Subjective: Notes and labs reviewed.  Overnight events noted.  Into see patient.  Nurse is at bedside attempting to provide routine medication.  Patient refuses medication and pushes nurses hand away, and then states "I guess he didn't want to kiss me".  No family currently at bedside.  Called to speak to patient's husband.  Discussed overnight events, and her refusal of medications.  Discussed her diagnoses. Discussed her status and diagnostics.  Discussed a previous note by myself and a goals of care conversation around a year ago with them.   Discussed overall prognosis.  Discussed patient autonomy, and attempting to provide care when someone does not want the care offered.  Discussed her feelings on trilogy and BiPAP prior to this admission.  Long discussion regarding care moving forward, and different scenarios.  Husband discusses that healthcare needs and admissions have increased over time.  He states he wishes that he could speak to his wife but understands that she is confused and unable to.  Encouraged him to speak with his son.  Husband wants time for outcomes, and space to consider his wishes.  Discussed that I would call him tomorrow (Friday) if there are acute changes overnight.  Discussed that if there were no acute changes, I would follow-up with him Tuesday on my return to service.  If patient is discharged prior to Tuesday, would recommend outpatient palliative to follow to continue  conversations.  Length of Stay: 13  Current Medications: Scheduled Meds:   apixaban  5 mg Oral BID   atorvastatin  20 mg Oral Daily   budesonide (PULMICORT) nebulizer solution  0.25 mg Nebulization BID   buPROPion  150 mg Oral Daily   feeding supplement  237 mL Oral BID BM   hydrALAZINE  10 mg Oral Q8H   hydrocerin   Topical BID   ipratropium-albuterol  3 mL Nebulization BID   isosorbide mononitrate  15 mg Oral BID   levothyroxine  250 mcg Oral Q0600   metoprolol tartrate  25 mg Oral TID  midodrine  5 mg Oral TID WC   pantoprazole  40 mg Oral Daily   potassium chloride  40 mEq Oral TID   rOPINIRole  1 mg Oral QHS    Continuous Infusions:   PRN Meds: acetaminophen, diltiazem, guaiFENesin, levalbuterol, liver oil-zinc oxide, metoprolol tartrate, ondansetron (ZOFRAN) IV, oxyCODONE, polyethylene glycol, senna-docusate  Physical Exam Pulmonary:     Effort: Pulmonary effort is normal.  Neurological:     Mental Status: She is alert.             Vital Signs: BP 119/82 (BP Location: Left Leg)   Pulse (!) 121   Temp 98.1 F (36.7 C) (Oral)   Resp 16   Ht '5\' 5"'$  (1.651 m)   Wt (!) 137.5 kg   SpO2 96%   BMI 50.44 kg/m  SpO2: SpO2: 96 % O2 Device: O2 Device: Nasal Cannula O2 Flow Rate: O2 Flow Rate (L/min): 1 L/min  Intake/output summary:  Intake/Output Summary (Last 24 hours) at 07/30/2022 1514 Last data filed at 07/30/2022 1300 Gross per 24 hour  Intake 1756.86 ml  Output 3320 ml  Net -1563.14 ml   LBM: Last BM Date : 07/25/22 Baseline Weight: Weight: (!) 136.5 kg Most recent weight: Weight: (!) 137.5 kg   Patient Active Problem List   Diagnosis Date Noted   Goals of care, counseling/discussion 07/28/2022   Acute pulmonary edema ([place]) 07/20/2022   HAP (hospital-acquired pneumonia) 07/20/2022   Chronic atrial fibrillation with RVR ([place]) 08/09/2022   Thoracic aortic aneurysm ([place]) 08/03/2022   Hyperkalemia 08/01/2022   Hypotension 08/07/2022   Leukocytosis  08/04/2022   Normocytic anemia 08/08/2022   Paroxysmal atrial fibrillation with RVR ([place]) 07/16/2022   SIRS (systemic inflammatory response syndrome) ([place]) 07/15/2022   Tachycardia 07/15/2022   AKI (acute kidney injury) ([place]) 07/15/2022   UTI (urinary tract infection) 07/05/2022   HTN (hypertension) 06/28/2022   Type II diabetes mellitus with renal manifestations ([place]) 07/06/2022   Depression with anxiety 07/10/2022   Iron deficiency anemia 06/27/2022   Sepsis ([place]) 06/25/2022   Myocardial injury 06/26/2022   Atrial fibrillation with RVR ([place]) 06/24/2022   Acute kidney injury ([place]) 05/20/2022   Chronic hypoxic respiratory failure, on home oxygen therapy ([place]) 05/20/2022   Congestive heart failure (CHF) ([place]) 05/20/2022   Microcytic anemia 05/20/2022   Chronic venous stasis dermatitis of both lower extremities 05/20/2022   Pressure ulcer of buttock 05/20/2022   Inflammatory arthritis 05/20/2022   Sinus bradycardia 05/20/2022   Dysuria 05/20/2022   PAF (paroxysmal atrial fibrillation) ([place]) 03/08/2022   Acute on chronic respiratory failure with hypoxia and hypercapnia (HCC) 03/05/2022   Effusion of knee joint, left 03/22/2021   Acute on chronic systolic CHF (congestive heart failure) ([place]) 03/19/2021   Acute on chronic heart failure ([place]) 03/14/2021   Acute respiratory failure with hypoxia ([place]) 03/14/2021   Diabetes mellitus without complication ([place]) 37/16/9678   HLD (hyperlipidemia) 04/09/2020   Depression 04/09/2020   Elevated troponin 04/09/2020   OSA (obstructive sleep apnea) 04/09/2020   Peripheral polyneuropathy 02/29/2020   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   Lumbar radiculopathy, acute    Acute midline low back pain without sciatica    Type 2 diabetes mellitus with diabetic neuropathy, without long-term current use of insulin ([place]) 02/21/2020   Hypothyroidism 02/21/2020   Morbid obesity with BMI of 50.0-59.9, adult ([place])  02/21/2020   CAP (community acquired pneumonia) 02/21/2020   Bilateral cellulitis of lower leg 02/21/2020   Acute respiratory failure with  hypoxia and hypercapnia ([place]) 02/21/2020   Acute renal failure superimposed on stage 3a chronic kidney disease ([place]) 62/26/3335   Acute metabolic encephalopathy 45/62/5638   Chronic kidney disease, stage 3a ([place]) 01/27/2020   Transaminitis 01/27/2020   Lymphedema of both lower extremities 08/21/2016   Benign essential hypertension 05/18/2016    Palliative Care Assessment & Plan    Recommendations/Plan: Husband wants time for outcomes, and space to consider his wishes.  Discussed that I would call him tomorrow (Friday) if there are acute changes overnight.  Discussed that if there were no acute changes, I would follow-up with him Tuesday on my return to service.  If patient is discharged prior to Tuesday, would recommend outpatient palliative to follow to continue conversations. Continue full code/full scope at this time.  Code Status:    Code Status Orders  (From admission, onward)           Start     Ordered   07/30/2022 2107  Full code  Continuous       Question:  By:  Answer:  Default: patient does not have capacity for decision making, no surrogate or prior directive available   07/21/2022 2107           Code Status History     Date Active Date Inactive Code Status Order ID Comments User Context   06/28/2022 0610 07/28/2022 1503 Full Code [id number]  Mansy, Arvella Merles, MD ED   05/20/2022 1733 05/29/2022 2115 Full Code [id number]  Jose Persia, MD ED   03/05/2022 2257 03/19/2022 1729 Full Code [id number]  Lang Snow, NP ED   07/15/2021 2331 07/29/2021 2207 Full Code [id number]  Cox, Amy N, DO ED   03/14/2021 2357 03/24/2021 2129 Full Code [id number]  Clarnce Flock, MD ED   04/09/2020 1838 04/19/2020 0100 Full Code [id number]  Ivor Costa, MD Inpatient   02/21/2020 0600 03/01/2020 2155 Full Code [id number]  Athena Masse, MD ED       Advance Directive Documentation    Flowsheet Row Most Recent Value  Type of Advance Directive Healthcare Power of Attorney  Pre-existing out of facility DNR order (yellow form or pink MOST form) --  "MOST" Form in Place? --       Prognosis: Poor overall    Asencion Gowda, NP  Please contact Palliative Medicine Team phone at [phone] for questions and concerns.

## 2022-07-30 NOTE — Progress Notes (Signed)
Patient agitated and pulling at/off bipap mask. Patient refusing to put bipap mask back on, patient placed on [place]. Patient appears confused and refusing for RT or RN to assist.

## 2022-07-30 NOTE — Progress Notes (Signed)
Patient refusing scheduled breathing treatments, bipap, and refusing to place oxygen back on. Patient refused to let RT or RN assist with oxygen placement.

## 2022-07-30 NOTE — Plan of Care (Signed)

## 2022-07-30 NOTE — Progress Notes (Addendum)
Progress Note    Madison Mcbride  AST:[id number] DOB: 30-Aug-1955  DOA: 07/29/2022 PCP: Rusty Aus, MD      Brief Narrative:    Medical records reviewed and are as summarized below:  Madison Mcbride is a 67 y.o. female with medical history significant for morbid obesity, chronic diastolic CHF, hypothyroidism, type II DM, obstructive sleep apnea, lymphedema, CKD stage IIIa, OSA, chronic hypoxic and hypercapnic respiratory failure on 2 L/min oxygen and trilogy machine at night.       Assessment/Plan:   Principal Problem:   Paroxysmal atrial fibrillation with RVR (HCC) Active Problems:   Acute on chronic respiratory failure with hypoxia and hypercapnia (HCC)   Acute metabolic encephalopathy   Acute on chronic systolic CHF (congestive heart failure) (HCC)   HLD (hyperlipidemia)   OSA (obstructive sleep apnea)   Type 2 diabetes mellitus with diabetic neuropathy, without long-term current use of insulin (HCC)   Elevated troponin   Hypothyroidism   Acute respiratory failure with hypoxia (HCC)   AKI (acute kidney injury) ([place])   Thoracic aortic aneurysm (HCC)   Hyperkalemia   Hypotension   Leukocytosis   Normocytic anemia   Acute pulmonary edema (HCC)   HAP (hospital-acquired pneumonia)   Goals of care, counseling/discussion   Body mass index is 50.44 kg/m.  (Morbid obesity)   Acute on chronic systolic CHF, pulmonary hypertension: Improved.  Torsemide has been held because of volume depletion. 2D echo showed EF estimated at 25 to 30%, RVSP 50.7 mmHg, BNP 2,302   AKI on CKD stage IIIa: IV Ringer's lactate infusion has been discontinued by nephrologist.  Monitor BMP and follow-up with nephrologist.   Acute on chronic hypoxic and hypercapnic respiratory failure, OHS, OSA: Patient had oxygen desaturation overnight because she had taken off her BiPAP.  Oxygen therapy has been decreased to 2 L/min by nasal cannula.  She uses 2 L/min oxygen at baseline.   Continue BiPAP at night.   Atrial fibrillation with RVR: Continue metoprolol and apixaban   Hypokalemia: Continue potassium repletion and monitor levels.  Acute toxic metabolic encephalopathy, delirium: Patient is confused today this is likely from delirium.  Dose of Requip had been decreased.  Recent hypoglycemia: Resolved   Other comorbidities include type II DM, restless leg syndrome, hypothyroidism, chronic venous stasis, anxiety, depression, chronic pain    Diet Order             Diet 2 gram sodium Room service appropriate? No; Fluid consistency: Thin  Diet effective now                            Consultants: Nephrologist Cardiologist  Procedures: None    Medications:    apixaban  5 mg Oral BID   atorvastatin  20 mg Oral Daily   budesonide (PULMICORT) nebulizer solution  0.25 mg Nebulization BID   buPROPion  150 mg Oral Daily   feeding supplement  237 mL Oral BID BM   hydrALAZINE  10 mg Oral Q8H   hydrocerin   Topical BID   ipratropium-albuterol  3 mL Nebulization BID   isosorbide mononitrate  15 mg Oral BID   levothyroxine  250 mcg Oral Q0600   metoprolol tartrate  25 mg Oral TID   midodrine  5 mg Oral TID WC   pantoprazole  40 mg Oral Daily   potassium chloride  40 mEq Oral TID   rOPINIRole  1 mg Oral QHS  Continuous Infusions:     Anti-infectives (From admission, onward)    Start     Dose/Rate Route Frequency Ordered Stop   07/25/22 1200  amoxicillin-clavulanate (AUGMENTIN) 500-125 MG per tablet 1 tablet        1 tablet Oral 2 times daily 07/25/22 1102 07/26/22 2117   07/24/22 1400  Ampicillin-Sulbactam (UNASYN) 3 g in sodium chloride 0.9 % 100 mL IVPB  Status:  Discontinued        3 g 200 mL/hr over 30 Minutes Intravenous Every 6 hours 07/24/22 1228 07/24/22 1231   07/24/22 1330  amoxicillin-clavulanate (AUGMENTIN) 875-125 MG per tablet 1 tablet  Status:  Discontinued        1 tablet Oral Every 12 hours 07/24/22 1231 07/25/22  1102   07/21/22 1500  Ampicillin-Sulbactam (UNASYN) 3 g in sodium chloride 0.9 % 100 mL IVPB        3 g 200 mL/hr over 30 Minutes Intravenous Every 6 hours 07/21/22 1357 07/22/22 2216   07/18/22 1900  azithromycin (ZITHROMAX) 500 mg in sodium chloride 0.9 % 250 mL IVPB  Status:  Discontinued        500 mg 250 mL/hr over 60 Minutes Intravenous Every 24 hours 07/18/22 0205 07/19/22 1204   07/18/22 1000  piperacillin-tazobactam (ZOSYN) IVPB 3.375 g  Status:  Discontinued        3.375 g 12.5 mL/hr over 240 Minutes Intravenous Every 8 hours 07/18/22 0842 07/21/22 1357   07/18/22 0800  cefTRIAXone (ROCEPHIN) 1 g in sodium chloride 0.9 % 100 mL IVPB  Status:  Discontinued        1 g 200 mL/hr over 30 Minutes Intravenous Every 24 hours 07/18/22 0205 07/18/22 0829   07/21/2022 1915  vancomycin (VANCOREADY) IVPB 2000 mg/400 mL        2,000 mg 200 mL/hr over 120 Minutes Intravenous  Once 07/18/2022 1914 08/07/2022 2250   07/23/2022 1900  ceFEPIme (MAXIPIME) 2 g in sodium chloride 0.9 % 100 mL IVPB        2 g 200 mL/hr over 30 Minutes Intravenous  Once 07/23/2022 1853 07/18/22 1900   07/20/2022 1900  azithromycin (ZITHROMAX) 500 mg in sodium chloride 0.9 % 250 mL IVPB        500 mg 250 mL/hr over 60 Minutes Intravenous  Once 07/16/2022 1853 07/18/22 1900              Family Communication/Anticipated D/C date and plan/Code Status   DVT prophylaxis:  apixaban (ELIQUIS) tablet 5 mg     Code Status: Full Code  Family Communication: None Disposition Plan: Plan to discharge to nursing home in 1 to 2 days   Status is: Inpatient Remains inpatient appropriate because: Monitor kidney function       Subjective:   Interval events noted.  She is more confused today.  She is unable to provide an adequate history.  Objective:    Vitals:   07/30/22 0827 07/30/22 0854 07/30/22 0856 07/30/22 1221  BP: 130/89   119/82  Pulse: (!) 105  (!) 101 (!) 121  Resp: '16  18 16  '$ Temp: 98.2 F (36.8 C)    98.1 F (36.7 C)  TempSrc: Oral   Oral  SpO2: 92% 90% 90% 96%  Weight:      Height:       No data found.   Intake/Output Summary (Last 24 hours) at 07/30/2022 1358 Last data filed at 07/30/2022 1200 Gross per 24 hour  Intake 1756.86 ml  Output 2920  ml  Net -1163.14 ml   Filed Weights   07/22/22 0321 07/23/22 0500 07/28/22 0904  Weight: (!) 140 kg 135.8 kg (!) 137.5 kg    Exam:  GEN: NAD SKIN: Warm and dry EYES: No pallor or icterus ENT: MMM CV: Irregular rate and rhythm PULM: CTA B ABD: soft, obese, NT, +BS CNS: AAO x 1 (person) , non focal EXT: No edema or tenderness      Data Reviewed:   I have personally reviewed following labs and imaging studies:  Labs: Labs show the following:   Basic Metabolic Panel: Recent Labs  Lab 07/26/22 0717 07/27/22 0505 07/27/22 0506 07/28/22 0547 07/29/22 0626 07/30/22 0700  NA 134* 133* 135 137 137 136  K 4.6 3.8 3.9 3.2* 3.1* 3.0*  CL 92* 92* 93* 93* 92* 89*  CO2 '29 28 29 30 '$ 35* 38*  GLUCOSE 84 71 69* 77 91 131*  BUN 70* 69* 69* 62* 51* 40*  CREATININE 2.71* 2.16* 2.06* 1.60* 1.23* 1.00  CALCIUM 7.9* 8.1* 8.2* 8.4* 8.4* 8.2*  MG 2.4 2.5*  --  2.3 2.1 1.8  PHOS  --   --  4.8*  --   --   --    GFR Estimated Creatinine Clearance: 75.8 mL/min (by C-G formula based on SCr of 1 mg/dL). Liver Function Tests: Recent Labs  Lab 07/27/22 0506  ALBUMIN 3.1*   No results for input(s): "LIPASE", "AMYLASE" in the last 168 hours. No results for input(s): "AMMONIA" in the last 168 hours. Coagulation profile No results for input(s): "INR", "PROTIME" in the last 168 hours.  CBC: Recent Labs  Lab 07/26/22 0717 07/27/22 0505 07/28/22 0547 07/29/22 0626 07/30/22 0700  WBC 12.8* 8.7 8.2 8.4 9.7  HGB 11.6* 10.7* 10.6* 10.4* 10.8*  HCT 40.6 36.7 35.6* 37.1 37.6  MCV 90.6 87.8 86.6 90.0 89.1  PLT 374 381 343 330 333   Cardiac Enzymes: No results for input(s): "CKTOTAL", "CKMB", "CKMBINDEX", "TROPONINI" in the last 168  hours. BNP (last 3 results) No results for input(s): "PROBNP" in the last 8760 hours. CBG: Recent Labs  Lab 07/28/22 2325 07/29/22 0052 07/29/22 0835 07/30/22 0438 07/30/22 0828  GLUCAP 175* 133* 89 108* 142*   D-Dimer: No results for input(s): "DDIMER" in the last 72 hours. Hgb A1c: No results for input(s): "HGBA1C" in the last 72 hours. Lipid Profile: No results for input(s): "CHOL", "HDL", "LDLCALC", "TRIG", "CHOLHDL", "LDLDIRECT" in the last 72 hours. Thyroid function studies: No results for input(s): "TSH", "T4TOTAL", "T3FREE", "THYROIDAB" in the last 72 hours.  Invalid input(s): "FREET3" Anemia work up: No results for input(s): "VITAMINB12", "FOLATE", "FERRITIN", "TIBC", "IRON", "RETICCTPCT" in the last 72 hours. Sepsis Labs: Recent Labs  Lab 07/27/22 0505 07/28/22 0547 07/29/22 0626 07/30/22 0700  WBC 8.7 8.2 8.4 9.7    Microbiology Recent Results (from the past 240 hour(s))  Urine Culture     Status: None   Collection Time: 07/23/22  6:00 AM   Specimen: Urine, Clean Catch  Result Value Ref Range Status   Specimen Description   Final    URINE, CLEAN CATCH Performed at [facility], [address], [place], [place] [address]    Special Requests   Final    Normal Performed at [facility], [address], [place], [place] [address]    Culture   Final    NO GROWTH Performed at [facility], [place] [address], [place], [place] [address]    Report Status 07/24/2022 FINAL  Final    Procedures and diagnostic studies:  No results found.             LOS: 13 days   [place] Copywriter, advertising on [email or web address]. If 7PM-7AM, please contact night-coverage at [email or web address]     07/30/2022, 1:58 PM

## 2022-07-30 NOTE — Progress Notes (Addendum)
[facility]  ROUNDING NOTE   Subjective:   Madison Mcbride with a past medical history of chronic diastolic heart failure, morbid obesity, hypothyroidism, diabetes mellitus type 2, obstructive sleep apnea, lymphedema, and chronic kidney disease stage IIIa.  Patient presents to the emergency department with complaints of shortness of breath.  She has been admitted for Acute pulmonary edema (HCC) [J81.0] Atrial fibrillation with RVR (HCC) [I48.91] HCAP (healthcare-associated pneumonia) [J18.9] Acute on chronic respiratory failure with hypoxia and hypercapnia ([place]) [I14.43, J96.22]  Patient is known to our practice and is seen outpatient by Dr. Holley Raring.  Patient was last seen in our office on April 03, 2021.  Patient has been lost to follow-up since that time.    Patient alert and oriented to self and place Appears to have more disorganized thoughts today Awaiting breakfast but states she are well yesterday     Objective:  Vital signs in last 24 hours:  Temp:  [97.5 F (36.4 C)-98.4 F (36.9 C)] 98.2 F (36.8 C) (01/18 0827) Pulse Rate:  [55-116] 101 (01/18 0856) Resp:  [14-22] 18 (01/18 0856) BP: (85-130)/(60-89) 130/89 (01/18 0827) SpO2:  [90 %-97 %] 90 % (01/18 0856) FiO2 (%):  [30 %] 30 % (01/17 2154)  Weight change:  Filed Weights   07/22/22 0321 07/23/22 0500 07/28/22 0904  Weight: (!) 140 kg 135.8 kg (!) 137.5 kg    Intake/Output: I/O last 3 completed shifts: In: 2111.7 [P.O.:1218; I.V.:893.7] Out: 1540 [Urine:5620]   Intake/Output this shift:  No intake/output data recorded.  Physical Exam: General: NAD  Head: Normocephalic, atraumatic. Moist oral mucosal membranes  Eyes: Anicteric  Lungs:  Clear to auscultation, normal effort, room air  Heart: Regular rate and rhythm  Abdomen:  Soft, nontender, obese  Extremities: Trace to 1+ peripheral edema.  Neurologic: Alert, able to move all four extremities  Skin: No lesions  Access: None     Basic Metabolic Panel: Recent Labs  Lab 07/26/22 0717 07/27/22 0505 07/27/22 0506 07/28/22 0547 07/29/22 0626 07/30/22 0700  NA 134* 133* 135 137 137 136  K 4.6 3.8 3.9 3.2* 3.1* 3.0*  CL 92* 92* 93* 93* 92* 89*  CO2 '29 28 29 30 '$ 35* 38*  GLUCOSE 84 71 69* 77 91 131*  BUN 70* 69* 69* 62* 51* 40*  CREATININE 2.71* 2.16* 2.06* 1.60* 1.23* 1.00  CALCIUM 7.9* 8.1* 8.2* 8.4* 8.4* 8.2*  MG 2.4 2.5*  --  2.3 2.1 1.8  PHOS  --   --  4.8*  --   --   --      Liver Function Tests: Recent Labs  Lab 07/27/22 0506  ALBUMIN 3.1*    No results for input(s): "LIPASE", "AMYLASE" in the last 168 hours. No results for input(s): "AMMONIA" in the last 168 hours.  CBC: Recent Labs  Lab 07/26/22 0717 07/27/22 0505 07/28/22 0547 07/29/22 0626 07/30/22 0700  WBC 12.8* 8.7 8.2 8.4 9.7  HGB 11.6* 10.7* 10.6* 10.4* 10.8*  HCT 40.6 36.7 35.6* 37.1 37.6  MCV 90.6 87.8 86.6 90.0 89.1  PLT 374 381 343 330 333     Cardiac Enzymes: No results for input(s): "CKTOTAL", "CKMB", "CKMBINDEX", "TROPONINI" in the last 168 hours.  BNP: Invalid input(s): "POCBNP"  CBG: Recent Labs  Lab 07/28/22 2325 07/29/22 0052 07/29/22 0835 07/30/22 0438 07/30/22 0828  GLUCAP 175* 133* 89 108* 142*     Microbiology: Results for orders placed or performed during the hospital encounter of 07/18/2022  Blood culture (routine x 2)  Status: None   Collection Time: 07/27/2022 11:01 PM   Specimen: BLOOD  Result Value Ref Range Status   Specimen Description BLOOD BLOOD RIGHT WRIST  Final   Special Requests   Final    BOTTLES DRAWN AEROBIC AND ANAEROBIC Blood Culture results may not be optimal due to an inadequate volume of blood received in culture bottles   Culture   Final    NO GROWTH 5 DAYS Performed at [facility], [address] [place], [place] [address]    Report Status 07/22/2022 FINAL  Final  Blood culture (routine x 2)     Status: None   Collection Time: 07/31/2022 11:01 PM    Specimen: BLOOD  Result Value Ref Range Status   Specimen Description BLOOD BLOOD LEFT HAND  Final   Special Requests IN PEDIATRIC BOTTLE Blood Culture adequate volume  Final   Culture   Final    NO GROWTH 5 DAYS Performed at [facility], [address] [place], [place] [address]    Report Status 07/22/2022 FINAL  Final  MRSA Next Gen by PCR, Nasal     Status: None   Collection Time: 07/18/22 10:11 AM   Specimen: Nasal Mucosa; Nasal Swab  Result Value Ref Range Status   MRSA by PCR Next Gen NOT DETECTED NOT DETECTED Final    Comment: (NOTE) The GeneXpert MRSA Assay (FDA approved for NASAL specimens only), is one component of a comprehensive MRSA colonization surveillance program. It is not intended to diagnose MRSA infection nor to guide or monitor treatment for MRSA infections. Test performance is not FDA approved in patients less than 14 years old. Performed at [facility], [place], [place] [address]   Respiratory (~20 pathogens) panel by PCR     Status: None   Collection Time: 07/18/22 11:07 AM   Specimen: Nasopharyngeal Swab; Respiratory  Result Value Ref Range Status   Adenovirus NOT DETECTED NOT DETECTED Final   Coronavirus 229E NOT DETECTED NOT DETECTED Final    Comment: (NOTE) The Coronavirus on the Respiratory Panel, DOES NOT test for the novel  Coronavirus (2019 nCoV)    Coronavirus HKU1 NOT DETECTED NOT DETECTED Final   Coronavirus NL63 NOT DETECTED NOT DETECTED Final   Coronavirus OC43 NOT DETECTED NOT DETECTED Final   Metapneumovirus NOT DETECTED NOT DETECTED Final   Rhinovirus / Enterovirus NOT DETECTED NOT DETECTED Final   Influenza A NOT DETECTED NOT DETECTED Final   Influenza B NOT DETECTED NOT DETECTED Final   Parainfluenza Virus 1 NOT DETECTED NOT DETECTED Final   Parainfluenza Virus 2 NOT DETECTED NOT DETECTED Final   Parainfluenza Virus 3 NOT DETECTED NOT DETECTED Final   Parainfluenza Virus 4 NOT DETECTED NOT  DETECTED Final   Respiratory Syncytial Virus NOT DETECTED NOT DETECTED Final   Bordetella pertussis NOT DETECTED NOT DETECTED Final   Bordetella Parapertussis NOT DETECTED NOT DETECTED Final   Chlamydophila pneumoniae NOT DETECTED NOT DETECTED Final   Mycoplasma pneumoniae NOT DETECTED NOT DETECTED Final    Comment: Performed at [facility], [place]. [address], [place], [place] [address]  Urine Culture     Status: None   Collection Time: 07/23/22  6:00 AM   Specimen: Urine, Clean Catch  Result Value Ref Range Status   Specimen Description   Final    URINE, CLEAN CATCH Performed at [facility], [address], [place],  [address]    Special Requests   Final    Normal  Performed at [facility], [address], [place], [place] [address]    Culture   Final    NO GROWTH Performed at [facility], [place] [address] [place], [place] [address]    Report Status 07/24/2022 FINAL  Final    Coagulation Studies: No results for input(s): "LABPROT", "INR" in the last 72 hours.  Urinalysis: No results for input(s): "COLORURINE", "LABSPEC", "PHURINE", "GLUCOSEU", "HGBUR", "BILIRUBINUR", "KETONESUR", "PROTEINUR", "UROBILINOGEN", "NITRITE", "LEUKOCYTESUR" in the last 72 hours.  Invalid input(s): "APPERANCEUR"    Imaging: No results found.   Medications:    lactated ringers 40 mL/hr at 07/30/22 0700    apixaban  5 mg Oral BID   atorvastatin  20 mg Oral Daily   budesonide (PULMICORT) nebulizer solution  0.25 mg Nebulization BID   buPROPion  150 mg Oral Daily   feeding supplement  237 mL Oral BID BM   hydrALAZINE  10 mg Oral Q8H   hydrocerin   Topical BID   ipratropium-albuterol  3 mL Nebulization TID   isosorbide mononitrate  15 mg Oral BID   levothyroxine  250 mcg Oral Q0600   metoprolol tartrate  25 mg Oral TID   midodrine  5 mg Oral TID WC   pantoprazole  40 mg Oral Daily   potassium chloride  40 mEq Oral TID   rOPINIRole  1 mg Oral QHS    acetaminophen, diltiazem, guaiFENesin, levalbuterol, liver oil-zinc oxide, metoprolol tartrate, ondansetron (ZOFRAN) IV, oxyCODONE, polyethylene glycol, senna-docusate  Assessment/ Plan:  Ms. Madison Mcbride is a 67 y.o.  female  past medical history of chronic diastolic heart failure, morbid obesity, hypothyroidism, diabetes mellitus type 2, obstructive sleep apnea, lymphedema, and chronic kidney disease stage IIIa.  Patient presents to the emergency department with complaints of shortness of breath.  She has been admitted for Acute pulmonary edema (HCC) [J81.0] Atrial fibrillation with RVR (HCC) [I48.91] HCAP (healthcare-associated pneumonia) [J18.9] Acute on chronic respiratory failure with hypoxia and hypercapnia (HCC) [N62.95, J96.22]   Acute Kidney Injury on chronic kidney disease stage IIIa with baseline creatinine 1.35 and GFR of 54 on 07/16/22.  Acute kidney injury appears multifactorial due to IV contrast exposure and aggressive diuresis.  IV contrast exposure noted on 07/13/2022.  Renal ultrasound negative for obstruction.    Renal function has returned to baseline. Urine output noted at 3.6L in proceeding Appears intake has improved. Will stop IVF.    Lab Results  Component Value Date   CREATININE 1.00 07/30/2022   CREATININE 1.23 (H) 07/29/2022   CREATININE 1.60 (H) 07/28/2022    Intake/Output Summary (Last 24 hours) at 07/30/2022 1107 Last data filed at 07/30/2022 0700 Gross per 24 hour  Intake 1573.72 ml  Output 2920 ml  Net -1346.28 ml    2.  Acute on chronic systolic heart failure.  Echo completed on 03/06/2022 shows EF 45 to 50% with a mild to moderate MVR, TVR, and AVR.  Cardiology following. Torsemide held  3. Anemia of chronic kidney disease  Normocytic Lab Results  Component Value Date   HGB 10.8 (L) 07/30/2022  Hemoglobin acceptable. Will continue to monitor.  4.  Hypertension with chronic kidney disease. Receiving diltiazem, hydralazine, isosorbide,  Torsemide, and metoprolol.  Blood pressure soft. Also prescribed Midodrine '5mg'$  TID  Due to renal recovery, we will sign off at this time.  Feel free to reconsult with any further concerns.    LOS: [place] 1/18/202411:07 AM

## 2022-07-30 NOTE — Plan of Care (Signed)
  Problem: Education: ?Goal: Knowledge of General Education information will improve ?Description: Including pain rating scale, medication(s)/side effects and non-pharmacologic comfort measures ?Outcome: Progressing ?  ?Problem: Clinical Measurements: ?Goal: Respiratory complications will improve ?Outcome: Progressing ?  ?Problem: Clinical Measurements: ?Goal: Cardiovascular complication will be avoided ?Outcome: Progressing ?  ?Problem: Pain Managment: ?Goal: General experience of comfort will improve ?Outcome: Progressing ?  ?Problem: Safety: ?Goal: Ability to remain free from injury will improve ?Outcome: Progressing ?  ?

## 2022-07-30 NOTE — Progress Notes (Addendum)
Staff RN heard pt BIPAP going off. On Assessment pt removed BIPAP off and desating to 84 % and states " I can't breath please help me: Pt was assisted to put BIPAP on and then states : "Do not touch me" Staff RN then explained that we need to put it back on so she can breath better. Charge nurse came in the room encouraged pts to wear BIPAP but pt is refusing again. Finally after 3 minutes of encouragement pt then agreed to wear oxygen. Pt was complaining of nausea but when the staff RN going to administered the zofran pt states " do not touch me". Staff RN charted refused on MAR. Pt was detached from the IVPB lactated ringer to helped with pt care then pt refused to allow the nurse to reattach her peripheral IV fluids. Pt then call 911 to get help. 911 answered and was asking pt what's wrong patient states that "she feels like she is being held hostage". RN staff called the husband was able to reorient the patient and talk her into getting reattached to her IV fluids. Will continue to monitor.

## 2022-07-30 NOTE — Progress Notes (Signed)
Physical Therapy Treatment Patient Details Name: Madison Mcbride MRN: [id number] DOB: 18-Feb-1956 Today's Date: 07/30/2022   History of Present Illness 67 y.o. female with medical history significant of thyroid cancer, hypothyroidism, hyperlipidemia, Dm2, CKD stage 3, Pafib , CHFpEF, OSA on CPAP, was discharged earlier today to SNF and found to be tachycardic on intake and sent back to ED.    PT Comments    Pt resting in bed upon PT arrival; pt's nurse present entire session.  Pt appearing more confused today than last therapy session.  Pt verbalizing need to toilet but had significant difficulty following cues to attempt to toilet (using bed pan/ purewick or to sit up on edge of bed--although pt initially requesting to use BSC); pt's bed sheets noted to be very wet requiring 2 assist for clean-up (max assist for logrolling L and R in bed with vc's and tactile cues).  Will continue to focus on strengthening and progressive functional mobility as able.   Recommendations for follow up therapy are one component of a multi-disciplinary discharge planning process, led by the attending physician.  Recommendations may be updated based on patient status, additional functional criteria and insurance authorization.  Follow Up Recommendations  Skilled nursing-short term rehab (<3 hours/day) Can patient physically be transported by private vehicle: No   Assistance Recommended at Discharge Frequent or constant Supervision/Assistance  Patient can return home with the following Two people to help with walking and/or transfers;Help with stairs or ramp for entrance;Assist for transportation;Assistance with cooking/housework;Direct supervision/assist for medications management;Direct supervision/assist for financial management;Two people to help with bathing/dressing/bathroom   Equipment Recommendations  Wheelchair (measurements PT);Wheelchair cushion (measurements PT);Hospital bed;BSC/3in1;Other (comment)  (hoyer lift)    Recommendations for Other Services OT consult     Precautions / Restrictions Precautions Precautions: Fall Restrictions Weight Bearing Restrictions: No     Mobility  Bed Mobility Overal bed mobility: Needs Assistance Bed Mobility: Rolling Rolling: Max assist (logrolling L and R in bed for linen change)         General bed mobility comments: assist for trunk and B LE's; vc's and tactile cues for technique; 2 assist to boost pt up in bed using bed sheet end of session    Transfers                   General transfer comment: deferred    Ambulation/Gait                   Stairs             Wheelchair Mobility    Modified Rankin (Stroke Patients Only)       Balance                                            Cognition Arousal/Alertness: Awake/alert Behavior During Therapy: WFL for tasks assessed/performed Overall Cognitive Status: Impaired/Different from baseline                                 General Comments: Pt oriented to name and month/day of birthday only; able to state she is in hospital at end of session (but not at beginning); general confusion noted throughout session        Exercises      General Comments  Nursing cleared pt for participation in physical  therapy.  Pt agreeable to PT session.      Pertinent Vitals/Pain Pain Assessment Pain Assessment: Faces Faces Pain Scale: Hurts a little bit Pain Location: R LE Pain Descriptors / Indicators: Discomfort Pain Intervention(s): Limited activity within patient's tolerance, Monitored during session, Repositioned HR 112-130 bpm during session; O2 sats 94% on 2 L via nasal cannula end of session at rest    Home Living                          Prior Function            PT Goals (current goals can now be found in the care plan section) Acute Rehab PT Goals Patient Stated Goal: to go back to rehab PT Goal  Formulation: With patient Time For Goal Achievement: 08/07/22 Potential to Achieve Goals: Fair Progress towards PT goals: Not progressing toward goals - comment (d/t impaired cognition and bed soiled)    Frequency    Min 2X/week      PT Plan Current plan remains appropriate    Co-evaluation              AM-PAC PT "6 Clicks" Mobility   Outcome Measure  Help needed turning from your back to your side while in a flat bed without using bedrails?: A Lot Help needed moving from lying on your back to sitting on the side of a flat bed without using bedrails?: Total Help needed moving to and from a bed to a chair (including a wheelchair)?: Total Help needed standing up from a chair using your arms (e.g., wheelchair or bedside chair)?: Total Help needed to walk in hospital room?: Total Help needed climbing 3-5 steps with a railing? : Total 6 Click Score: 7    End of Session Equipment Utilized During Treatment: Oxygen Activity Tolerance: Patient limited by fatigue;Other (comment) (limited d/t confusion/ability to participate) Patient left: in bed;with call bell/phone within reach;with bed alarm set;Other (comment) (B heels floating via pillow support; purewick in place) Nurse Communication: Mobility status;Precautions PT Visit Diagnosis: Unsteadiness on feet (R26.81);Muscle weakness (generalized) (M62.81)     Time: 1610-9604 PT Time Calculation (min) (ACUTE ONLY): 35 min  Charges:  $Therapeutic Activity: 23-37 mins                     Leitha Bleak, PT 07/30/22, 12:53 PM

## 2022-07-30 NOTE — Progress Notes (Signed)
[facility]    SUBJECTIVE: Patient states she feels reasonably well no worsening shortness of breath no pain no fever feels much improved   Vitals:   07/30/22 0827 07/30/22 0854 07/30/22 0856 07/30/22 1221  BP: 130/89   119/82  Pulse: (!) 105  (!) 101 (!) 121  Resp: '16  18 16  '$ Temp: 98.2 F (36.8 C)   98.1 F (36.7 C)  TempSrc: Oral   Oral  SpO2: 92% 90% 90% 96%  Weight:      Height:         Intake/Output Summary (Last 24 hours) at 07/30/2022 1622 Last data filed at 07/30/2022 1300 Gross per 24 hour  Intake 1756.86 ml  Output 3320 ml  Net -1563.14 ml      PHYSICAL EXAM  General: Well developed, well nourished, in no acute distress morbid obesity resting comfortably in bed HEENT:  Normocephalic and atramatic Neck:  No JVD.  Lungs: Clear bilaterally to auscultation and percussion. Heart: HRRR . Normal S1 and S2 without gallops or murmurs.  Abdomen: Bowel sounds are positive, abdomen soft and non-tender  Msk:  Back normal, normal gait. Normal strength and tone for age. Extremities: No clubbing, cyanosis or edema.   Neuro: Alert and oriented X 3. Psych:  Good affect, responds appropriately   LABS: Basic Metabolic Panel: Recent Labs    07/29/22 0626 07/30/22 0700  NA 137 136  K 3.1* 3.0*  CL 92* 89*  CO2 35* 38*  GLUCOSE 91 131*  BUN 51* 40*  CREATININE 1.23* 1.00  CALCIUM 8.4* 8.2*  MG 2.1 1.8   Liver Function Tests: No results for input(s): "AST", "ALT", "ALKPHOS", "BILITOT", "PROT", "ALBUMIN" in the last 72 hours. No results for input(s): "LIPASE", "AMYLASE" in the last 72 hours. CBC: Recent Labs    07/29/22 0626 07/30/22 0700  WBC 8.4 9.7  HGB 10.4* 10.8*  HCT 37.1 37.6  MCV 90.0 89.1  PLT 330 333   Cardiac Enzymes: No results for input(s): "CKTOTAL", "CKMB", "CKMBINDEX", "TROPONINI" in the last 72 hours. BNP: Invalid input(s): "POCBNP" D-Dimer: No results for input(s): "DDIMER" in the last 72 hours. Hemoglobin A1C: No results for  input(s): "HGBA1C" in the last 72 hours. Fasting Lipid Panel: No results for input(s): "CHOL", "HDL", "LDLCALC", "TRIG", "CHOLHDL", "LDLDIRECT" in the last 72 hours. Thyroid Function Tests: No results for input(s): "TSH", "T4TOTAL", "T3FREE", "THYROIDAB" in the last 72 hours.  Invalid input(s): "FREET3" Anemia Panel: No results for input(s): "VITAMINB12", "FOLATE", "FERRITIN", "TIBC", "IRON", "RETICCTPCT" in the last 72 hours.  No results found.   Echo severely severely depressed left ventricular function EF around 25 to 30%  TELEMETRY: Atrial fibrillation rate of around 100 with nonspecific ST-T wave changes:  ASSESSMENT AND PLAN:  Principal Problem:   Paroxysmal atrial fibrillation with RVR (HCC) Active Problems:   Type 2 diabetes mellitus with diabetic neuropathy, without long-term current use of insulin (HCC)   Hypothyroidism   Acute metabolic encephalopathy   HLD (hyperlipidemia)   Elevated troponin   OSA (obstructive sleep apnea)   Acute respiratory failure with hypoxia (HCC)   Acute on chronic systolic CHF (congestive heart failure) (HCC)   Acute on chronic respiratory failure with hypoxia and hypercapnia (HCC)   AKI (acute kidney injury) ([facility])   Thoracic aortic aneurysm (HCC)   Hyperkalemia   Hypotension   Leukocytosis   Normocytic anemia   Acute pulmonary edema (HCC)   HAP (hospital-acquired pneumonia)   Goals of care, counseling/discussion    Plan Paroxysmal atrial  fibrillation rate controlled now continue beta-blockers calcium blockers as necessary anticoagulation Acute on chronic systolic and diastolic congestive heart failure continue diuretics continue heart failure therapy patient appears to be reasonably compensated with reduced shortness of breath Acute on chronic renal insufficiency stage IIIa currently getting low-level infusion to help with hydration as per nephrology will continue to follow-up chemistries Obstructive sleep apnea recommend patient  maintain CPAP or BiPAP for respiratory support Relative hypotension midodrine as needed to help with blood pressure support as well as modest hydration Patient originally presented with hospital-acquired pneumonia and was treated with broad-spectrum antibiotic therapy shortness of breath is much improved now Diabetes reasonably controlled continue current management diet medications as necessary Acute on chronic respiratory failure with hypoxemia improved supplemental oxygen as necessary inhalers as necessary No invasive cardiac procedures recommended at this stage   Yolonda Kida, MD 07/30/2022 4:22 PM

## 2022-07-30 NOTE — Progress Notes (Addendum)
Pt refused blood sugar checked. Will continue to monitor.  Update 0438: Pt allowed to checked blood sugar by the staff. Blood sugar checked. Will continue to monitor.

## 2022-07-31 DIAGNOSIS — R410 Disorientation, unspecified: Secondary | ICD-10-CM | POA: Insufficient documentation

## 2022-07-31 DIAGNOSIS — J9621 Acute and chronic respiratory failure with hypoxia: Secondary | ICD-10-CM | POA: Diagnosis not present

## 2022-07-31 DIAGNOSIS — I480 Paroxysmal atrial fibrillation: Secondary | ICD-10-CM | POA: Diagnosis not present

## 2022-07-31 DIAGNOSIS — I5023 Acute on chronic systolic (congestive) heart failure: Secondary | ICD-10-CM | POA: Diagnosis not present

## 2022-07-31 LAB — GLUCOSE, CAPILLARY
Glucose-Capillary: 121 mg/dL — ABNORMAL HIGH (ref 70–99)
Glucose-Capillary: 126 mg/dL — ABNORMAL HIGH (ref 70–99)
Glucose-Capillary: 115 mg/dL — ABNORMAL HIGH (ref 70–99)

## 2022-07-31 LAB — CBC
HCT: 42.8 % (ref 36.0–46.0)
Hemoglobin: 12 g/dL (ref 12.0–15.0)
MCH: 25.5 pg — ABNORMAL LOW (ref 26.0–34.0)
MCHC: 28 g/dL — ABNORMAL LOW (ref 30.0–36.0)
MCV: 91.1 fL (ref 80.0–100.0)
Platelets: 363 10*3/uL (ref 150–400)
RBC: 4.7 MIL/uL (ref 3.87–5.11)
RDW: 23.4 % — ABNORMAL HIGH (ref 11.5–15.5)
WBC: 14.3 10*3/uL — ABNORMAL HIGH (ref 4.0–10.5)
nRBC: 0 % (ref 0.0–0.2)

## 2022-07-31 LAB — BASIC METABOLIC PANEL
Anion gap: 11 (ref 5–15)
BUN: 32 mg/dL — ABNORMAL HIGH (ref 8–23)
CO2: 34 mmol/L — ABNORMAL HIGH (ref 22–32)
Calcium: 8.7 mg/dL — ABNORMAL LOW (ref 8.9–10.3)
Chloride: 95 mmol/L — ABNORMAL LOW (ref 98–111)
Creatinine, Ser: 0.99 mg/dL (ref 0.44–1.00)
GFR, Estimated: >60 mL/min (ref >60)
Glucose, Bld: 126 mg/dL — ABNORMAL HIGH (ref 70–99)
Potassium: 4.2 mmol/L (ref 3.5–5.1)
Sodium: 140 mmol/L (ref 135–145)

## 2022-07-31 LAB — MAGNESIUM: Magnesium: 1.9 mg/dL (ref 1.7–2.4)

## 2022-07-31 MED ORDER — AMIODARONE HCL IN DEXTROSE 360-4.14 MG/200ML-% IV SOLN
30.0000 mg/h | INTRAVENOUS | Status: DC
  Administered 2022-07-31: 30 mg/h via INTRAVENOUS
  Filled 2022-07-31: qty 200

## 2022-07-31 MED ORDER — AMIODARONE LOAD VIA INFUSION
150.0000 mg | Freq: Once | INTRAVENOUS | Status: AC
  Administered 2022-07-31: 150 mg via INTRAVENOUS
  Filled 2022-07-31: qty 83.34

## 2022-07-31 MED ORDER — AMIODARONE HCL IN DEXTROSE 360-4.14 MG/200ML-% IV SOLN
60.0000 mg/h | INTRAVENOUS | Status: AC
  Administered 2022-07-31 (×2): 60 mg/h via INTRAVENOUS
  Filled 2022-07-31 (×2): qty 200

## 2022-07-31 MED ORDER — METOPROLOL TARTRATE 50 MG PO TABS
50.0000 mg | ORAL_TABLET | Freq: Three times a day (TID) | ORAL | Status: DC
  Administered 2022-07-31 – 2022-08-01 (×5): 50 mg via ORAL
  Filled 2022-07-31 (×6): qty 1

## 2022-07-31 MED ORDER — HALOPERIDOL 0.5 MG PO TABS
0.5000 mg | ORAL_TABLET | Freq: Two times a day (BID) | ORAL | Status: AC
  Administered 2022-07-31 – 2022-08-02 (×6): 0.5 mg via ORAL
  Filled 2022-07-31 (×7): qty 1

## 2022-07-31 MED ORDER — MIDODRINE HCL 5 MG PO TABS
10.0000 mg | ORAL_TABLET | Freq: Three times a day (TID) | ORAL | Status: DC
  Administered 2022-07-31 – 2022-08-12 (×29): 10 mg via ORAL
  Filled 2022-07-31 (×29): qty 2

## 2022-07-31 MED ORDER — DILTIAZEM HCL 30 MG PO TABS
60.0000 mg | ORAL_TABLET | Freq: Four times a day (QID) | ORAL | Status: DC
  Administered 2022-07-31 – 2022-08-02 (×5): 60 mg via ORAL
  Filled 2022-07-31 (×7): qty 2

## 2022-07-31 MED ORDER — MAGNESIUM SULFATE 2 GM/50ML IV SOLN
2.0000 g | Freq: Once | INTRAVENOUS | Status: AC
  Administered 2022-07-31: 2 g via INTRAVENOUS
  Filled 2022-07-31: qty 50

## 2022-07-31 NOTE — Progress Notes (Addendum)
Progress Note    Madison Mcbride  TGY:[id number] DOB: 1955/12/05  DOA: 07/13/2022 PCP: Rusty Aus, MD      Brief Narrative:    Medical records reviewed and are as summarized below:  Madison Mcbride is a 67 y.o. female with medical history significant for morbid obesity, chronic diastolic CHF, hypothyroidism, type II DM, obstructive sleep apnea, lymphedema, CKD stage IIIa, OSA, chronic hypoxic and hypercapnic respiratory failure on 2 L/min oxygen and trilogy machine at night.       Assessment/Plan:   Principal Problem:   Paroxysmal atrial fibrillation with RVR (HCC) Active Problems:   Acute on chronic respiratory failure with hypoxia and hypercapnia (HCC)   Acute metabolic encephalopathy   Acute on chronic systolic CHF (congestive heart failure) (HCC)   HLD (hyperlipidemia)   OSA (obstructive sleep apnea)   Type 2 diabetes mellitus with diabetic neuropathy, without long-term current use of insulin (HCC)   Elevated troponin   Hypothyroidism   Acute respiratory failure with hypoxia (HCC)   AKI (acute kidney injury) ([place])   Thoracic aortic aneurysm (HCC)   Hyperkalemia   Hypotension   Leukocytosis   Normocytic anemia   Acute pulmonary edema (HCC)   HAP (hospital-acquired pneumonia)   Goals of care, counseling/discussion   Delirium   Body mass index is 50.44 kg/m.  (Morbid obesity)   Acute on chronic systolic CHF, pulmonary hypertension: Improved.  Torsemide has been held because of recent AKI. 2D echo showed EF estimated at 25 to 30%, RVSP 50.7 mmHg, BNP 2,302   AKI on CKD stage IIIa: Creatinine has improved.  She is off of IV fluids.   Acute on chronic hypoxic and hypercapnic respiratory failure, OHS, OSA: She is on 2 to 5 L/min oxygen via [place].  Use BiPAP at night.  She uses 2 L/min oxygen at baseline.     Atrial fibrillation with RVR: Rate is still uncontrolled.  She has been started on IV amiodarone by cardiologist.  Metoprolol dose has  been increased to 50 mg 3 times daily.  As needed oral Cardizem has been changed to scheduled Cardizem at 60 mg every 6 hours.  Follow-up with cardiologist.   Hypokalemia: Improved.  Magnesium is 1.9.  Give IV magnesium sulfate to keep magnesium at 2 or more.  Acute toxic metabolic encephalopathy, delirium: Started low-dose Haldol for delirium.  Recent hypoglycemia: Resolved   Other comorbidities include type II DM, restless leg syndrome, hypothyroidism, chronic venous stasis, anxiety, depression, chronic pain   Plan of care was discussed with her husband over the phone   Diet Order             Diet 2 gram sodium Room service appropriate? No; Fluid consistency: Thin  Diet effective now                            Consultants: Nephrologist Cardiologist  Procedures: None    Medications:    apixaban  5 mg Oral BID   atorvastatin  20 mg Oral Daily   budesonide (PULMICORT) nebulizer solution  0.25 mg Nebulization BID   buPROPion  150 mg Oral Daily   diltiazem  60 mg Oral Q6H   feeding supplement  237 mL Oral BID BM   haloperidol  0.5 mg Oral BID   hydrocerin   Topical BID   ipratropium-albuterol  3 mL Nebulization BID   isosorbide mononitrate  15 mg Oral BID   levothyroxine  250 mcg Oral Q0600   metoprolol tartrate  50 mg Oral TID   midodrine  10 mg Oral TID WC   pantoprazole  40 mg Oral Daily   rOPINIRole  1 mg Oral QHS   Continuous Infusions:  amiodarone 60 mg/hr (07/31/22 1259)   Followed by   amiodarone     magnesium sulfate bolus IVPB        Anti-infectives (From admission, onward)    Start     Dose/Rate Route Frequency Ordered Stop   07/25/22 1200  amoxicillin-clavulanate (AUGMENTIN) 500-125 MG per tablet 1 tablet        1 tablet Oral 2 times daily 07/25/22 1102 07/26/22 2117   07/24/22 1400  Ampicillin-Sulbactam (UNASYN) 3 g in sodium chloride 0.9 % 100 mL IVPB  Status:  Discontinued        3 g 200 mL/hr over 30 Minutes Intravenous  Every 6 hours 07/24/22 1228 07/24/22 1231   07/24/22 1330  amoxicillin-clavulanate (AUGMENTIN) 875-125 MG per tablet 1 tablet  Status:  Discontinued        1 tablet Oral Every 12 hours 07/24/22 1231 07/25/22 1102   07/21/22 1500  Ampicillin-Sulbactam (UNASYN) 3 g in sodium chloride 0.9 % 100 mL IVPB        3 g 200 mL/hr over 30 Minutes Intravenous Every 6 hours 07/21/22 1357 07/22/22 2216   07/18/22 1900  azithromycin (ZITHROMAX) 500 mg in sodium chloride 0.9 % 250 mL IVPB  Status:  Discontinued        500 mg 250 mL/hr over 60 Minutes Intravenous Every 24 hours 07/18/22 0205 07/19/22 1204   07/18/22 1000  piperacillin-tazobactam (ZOSYN) IVPB 3.375 g  Status:  Discontinued        3.375 g 12.5 mL/hr over 240 Minutes Intravenous Every 8 hours 07/18/22 0842 07/21/22 1357   07/18/22 0800  cefTRIAXone (ROCEPHIN) 1 g in sodium chloride 0.9 % 100 mL IVPB  Status:  Discontinued        1 g 200 mL/hr over 30 Minutes Intravenous Every 24 hours 07/18/22 0205 07/18/22 0829   08/01/2022 1915  vancomycin (VANCOREADY) IVPB 2000 mg/400 mL        2,000 mg 200 mL/hr over 120 Minutes Intravenous  Once 07/30/2022 1914 08/01/2022 2250   08/08/2022 1900  ceFEPIme (MAXIPIME) 2 g in sodium chloride 0.9 % 100 mL IVPB        2 g 200 mL/hr over 30 Minutes Intravenous  Once 07/26/2022 1853 07/18/22 1900   07/29/2022 1900  azithromycin (ZITHROMAX) 500 mg in sodium chloride 0.9 % 250 mL IVPB        500 mg 250 mL/hr over 60 Minutes Intravenous  Once 07/30/2022 1853 07/18/22 1900              Family Communication/Anticipated D/C date and plan/Code Status   DVT prophylaxis:  apixaban (ELIQUIS) tablet 5 mg     Code Status: Full Code  Family Communication: Husband over the phone Disposition Plan: Plan to discharge to nursing home in 3 to 4 days   Status is: Inpatient Remains inpatient appropriate because: Rapid atrial fibrillation, delirium/altered mental status       Subjective:   Interval events noted.  She  had taken off  BiPAP during the night.  She is confused and unable to provide any history.  Objective:    Vitals:   07/31/22 1205 07/31/22 1300 07/31/22 1332 07/31/22 1400  BP: 126/89 107/68 106/85 (!) 128/91  Pulse: (!) 129 (!) 128 [name])  122 (!) 122  Resp: 18 (!) 31 19 (!) 21  Temp: 98.5 F (36.9 C)     TempSrc: Oral     SpO2: 93% 95% 93% 93%  Weight:      Height:       No data found.   Intake/Output Summary (Last 24 hours) at 07/31/2022 1440 Last data filed at 07/31/2022 1300 Gross per 24 hour  Intake 240 ml  Output 950 ml  Net -710 ml   Filed Weights   07/22/22 0321 07/23/22 0500 07/28/22 0904  Weight: (!) 140 kg 135.8 kg (!) 137.5 kg    Exam:    GEN: NAD SKIN: Warm and dry EYES: No pallor or icterus ENT: MMM CV: Irregular rate and rhythm, tachycardic PULM: CTA B ABD: soft, obese, NT, +BS CNS: AAO x 1 (person), confused, non focal EXT: No edema or tenderness     Data Reviewed:   I have personally reviewed following labs and imaging studies:  Labs: Labs show the following:   Basic Metabolic Panel: Recent Labs  Lab 07/27/22 0505 07/27/22 0506 07/28/22 0547 07/29/22 0626 07/30/22 0700 07/31/22 0533  NA 133* 135 137 137 136 140  K 3.8 3.9 3.2* 3.1* 3.0* 4.2  CL 92* 93* 93* 92* 89* 95*  CO2 '28 29 30 '$ 35* 38* 34*  GLUCOSE 71 69* 77 91 131* 126*  BUN 69* 69* 62* 51* 40* 32*  CREATININE 2.16* 2.06* 1.60* 1.23* 1.00 0.99  CALCIUM 8.1* 8.2* 8.4* 8.4* 8.2* 8.7*  MG 2.5*  --  2.3 2.1 1.8 1.9  PHOS  --  4.8*  --   --   --   --    GFR Estimated Creatinine Clearance: 76.6 mL/min (by C-G formula based on SCr of 0.99 mg/dL). Liver Function Tests: Recent Labs  Lab 07/27/22 0506  ALBUMIN 3.1*   No results for input(s): "LIPASE", "AMYLASE" in the last 168 hours. No results for input(s): "AMMONIA" in the last 168 hours. Coagulation profile No results for input(s): "INR", "PROTIME" in the last 168 hours.  CBC: Recent Labs  Lab 07/27/22 0505  07/28/22 0547 07/29/22 0626 07/30/22 0700 07/31/22 0533  WBC 8.7 8.2 8.4 9.7 14.3*  HGB 10.7* 10.6* 10.4* 10.8* 12.0  HCT 36.7 35.6* 37.1 37.6 42.8  MCV 87.8 86.6 90.0 89.1 91.1  PLT 381 343 330 333 363   Cardiac Enzymes: No results for input(s): "CKTOTAL", "CKMB", "CKMBINDEX", "TROPONINI" in the last 168 hours. BNP (last 3 results) No results for input(s): "PROBNP" in the last 8760 hours. CBG: Recent Labs  Lab 07/29/22 0835 07/30/22 0438 07/30/22 0828 07/31/22 0118 07/31/22 0826  GLUCAP 89 108* 142* 121* 126*   D-Dimer: No results for input(s): "DDIMER" in the last 72 hours. Hgb A1c: No results for input(s): "HGBA1C" in the last 72 hours. Lipid Profile: No results for input(s): "CHOL", "HDL", "LDLCALC", "TRIG", "CHOLHDL", "LDLDIRECT" in the last 72 hours. Thyroid function studies: No results for input(s): "TSH", "T4TOTAL", "T3FREE", "THYROIDAB" in the last 72 hours.  Invalid input(s): "FREET3" Anemia work up: No results for input(s): "VITAMINB12", "FOLATE", "FERRITIN", "TIBC", "IRON", "RETICCTPCT" in the last 72 hours. Sepsis Labs: Recent Labs  Lab 07/28/22 0547 07/29/22 0626 07/30/22 0700 07/31/22 0533  WBC 8.2 8.4 9.7 14.3*    Microbiology Recent Results (from the past 240 hour(s))  Urine Culture     Status: None   Collection Time: 07/23/22  6:00 AM   Specimen: Urine, Clean Catch  Result Value Ref Range Status   Specimen Description  Final    URINE, CLEAN CATCH Performed at [facility], [address], [place], [place] [address]    Special Requests   Final    Normal Performed at [facility], [address] [place], [place] [address]    Culture   Final    NO GROWTH Performed at [facility], [place] [address] [place], [place] [address]    Report Status 07/24/2022 FINAL  Final    Procedures and diagnostic studies:  No results found.             LOS: 14 days   [employer] Copywriter, advertising  on [email or web address]. If 7PM-7AM, please contact night-coverage at [email or web address]     07/31/2022, 2:40 PM

## 2022-07-31 NOTE — Plan of Care (Signed)
  Problem: Education: Goal: Knowledge of General Education information will improve Description: Including pain rating scale, medication(s)/side effects and non-pharmacologic comfort measures 07/31/2022 0757 by Shauna Hugh, RN Outcome: Progressing 07/31/2022 0757 by Shauna Hugh, RN Outcome: Not Progressing   Problem: Health Behavior/Discharge Planning: Goal: Ability to manage health-related needs will improve 07/31/2022 0757 by Shauna Hugh, RN Outcome: Progressing 07/31/2022 0757 by Shauna Hugh, RN Outcome: Not Progressing   Problem: Clinical Measurements: Goal: Ability to maintain clinical measurements within normal limits will improve 07/31/2022 0757 by Shauna Hugh, RN Outcome: Progressing 07/31/2022 0757 by Shauna Hugh, RN Outcome: Not Progressing Goal: Will remain free from infection 07/31/2022 0757 by Shauna Hugh, RN Outcome: Progressing 07/31/2022 0757 by Shauna Hugh, RN Outcome: Not Progressing Goal: Diagnostic test results will improve 07/31/2022 0757 by Shauna Hugh, RN Outcome: Progressing 07/31/2022 0757 by Shauna Hugh, RN Outcome: Not Progressing Goal: Respiratory complications will improve 07/31/2022 0757 by Shauna Hugh, RN Outcome: Progressing 07/31/2022 0757 by Shauna Hugh, RN Outcome: Not Progressing Goal: Cardiovascular complication will be avoided 07/31/2022 0757 by Shauna Hugh, RN Outcome: Progressing 07/31/2022 0757 by Shauna Hugh, RN Outcome: Not Progressing   Problem: Activity: Goal: Risk for activity intolerance will decrease 07/31/2022 0757 by Shauna Hugh, RN Outcome: Progressing 07/31/2022 0757 by Shauna Hugh, RN Outcome: Not Progressing   Problem: Nutrition: Goal: Adequate nutrition will be maintained 07/31/2022 0757 by Shauna Hugh, RN Outcome: Progressing 07/31/2022 0757 by Shauna Hugh, RN Outcome: Not Progressing   Problem: Coping: Goal: Level of anxiety will decrease 07/31/2022 0757 by Shauna Hugh, RN Outcome:  Progressing 07/31/2022 0757 by Shauna Hugh, RN Outcome: Not Progressing   Problem: Elimination: Goal: Will not experience complications related to bowel motility 07/31/2022 0757 by Shauna Hugh, RN Outcome: Progressing 07/31/2022 0757 by Shauna Hugh, RN Outcome: Not Progressing Goal: Will not experience complications related to urinary retention 07/31/2022 0757 by Shauna Hugh, RN Outcome: Progressing 07/31/2022 0757 by Shauna Hugh, RN Outcome: Not Progressing   Problem: Pain Managment: Goal: General experience of comfort will improve 07/31/2022 0757 by Shauna Hugh, RN Outcome: Progressing 07/31/2022 0757 by Shauna Hugh, RN Outcome: Not Progressing   Problem: Safety: Goal: Ability to remain free from injury will improve 07/31/2022 0757 by Shauna Hugh, RN Outcome: Progressing 07/31/2022 0757 by Shauna Hugh, RN Outcome: Not Progressing   Problem: Skin Integrity: Goal: Risk for impaired skin integrity will decrease 07/31/2022 0757 by Shauna Hugh, RN Outcome: Progressing 07/31/2022 0757 by Shauna Hugh, RN Outcome: Not Progressing

## 2022-07-31 NOTE — Progress Notes (Signed)
Occupational Therapy Treatment Patient Details Name: Madison Mcbride MRN: [id number] DOB: [birth year] Today's Date: 07/31/2022   History of present illness 67 y.o. female with medical history significant of thyroid cancer, hypothyroidism, hyperlipidemia, Dm2, CKD stage 3, Pafib , CHFpEF, OSA on CPAP, was discharged earlier today to SNF and found to be tachycardic on intake and sent back to ED.   OT comments  Madison Mcbride was seen for OT treatment on this date. Upon arrival to room pt reclined in bed, agreeable to tx. Pt requires MAX A x2 sup<>sit, BLE exit bed and attempting to lift trunk when pt reports unable to tolerate - noted HR 131 bpm, returned to supine. Left bed in chair position with meal setup. Pt reoriented to location/situation. Pt making progress toward goals, will continue to follow POC. Discharge recommendation remains appropriate.     Recommendations for follow up therapy are one component of a multi-disciplinary discharge planning process, led by the attending physician.  Recommendations may be updated based on patient status, additional functional criteria and insurance authorization.    Follow Up Recommendations  Skilled nursing-short term rehab (<3 hours/day)     Assistance Recommended at Discharge Frequent or constant Supervision/Assistance  Patient can return home with the following  Two people to help with walking and/or transfers;A lot of help with bathing/dressing/bathroom;Assistance with cooking/housework;Direct supervision/assist for medications management;Direct supervision/assist for financial management;Assist for transportation;Help with stairs or ramp for entrance   Equipment Recommendations  Other (comment)    Recommendations for Other Services      Precautions / Restrictions Precautions Precautions: Fall Restrictions Weight Bearing Restrictions: No       Mobility Bed Mobility Overal bed mobility: Needs Assistance Bed Mobility: Supine to Sit,  Sit to Supine     Supine to sit: Max assist, +2 for physical assistance, HOB elevated Sit to supine: Max assist, +2 for physical assistance   General bed mobility comments: attempted to exit bed, BLE exit bed and attempting to lift trunk when pt reports unable to continue - noted HR 131 bpm, returned to supine             Balance Overall balance assessment: Needs assistance Sitting-balance support: Bilateral upper extremity supported Sitting balance-Leahy Scale: Poor                                     ADL either performed or assessed with clinical judgement   ADL Overall ADL's : Needs assistance/impaired                                       General ADL Comments: MAX A don B socks bed level. SETUP self-feeding bed level      Cognition Arousal/Alertness: Awake/alert Behavior During Therapy: WFL for tasks assessed/performed Overall Cognitive Status: Impaired/Different from baseline Area of Impairment: Following commands, Memory                     Memory: Decreased recall of precautions, Decreased short-term memory Following Commands: Follows one step commands consistently       General Comments: pt asking what creature is under the bed and if they are doing any filming in here              General Comments scabs to L forearm, pt picking at them and encouraged to stop  Pertinent Vitals/ Pain       Pain Assessment Pain Assessment: Faces Faces Pain Scale: Hurts little more Pain Location: LLE Pain Descriptors / Indicators: Discomfort Pain Intervention(s): Limited activity within patient's tolerance, Repositioned   Frequency  Min 2X/week        Progress Toward Goals  OT Goals(current goals can now be found in the care plan section)  Progress towards OT goals: Progressing toward goals  Acute Rehab OT Goals Patient Stated Goal: to eat OT Goal Formulation: With patient Time For Goal Achievement:  08/07/22 Potential to Achieve Goals: Good ADL Goals Pt Will Perform Grooming: sitting;with set-up;with supervision Pt Will Perform Lower Body Dressing: sitting/lateral leans;with mod assist Pt Will Transfer to Toilet: with mod assist;with transfer board;bedside commode  Plan Discharge plan remains appropriate;Frequency remains appropriate    Co-evaluation                 AM-PAC OT "6 Clicks" Daily Activity     Outcome Measure   Help from another person eating meals?: None Help from another person taking care of personal grooming?: A Little Help from another person toileting, which includes using toliet, bedpan, or urinal?: A Lot Help from another person bathing (including washing, rinsing, drying)?: A Lot Help from another person to put on and taking off regular upper body clothing?: A Little Help from another person to put on and taking off regular lower body clothing?: A Lot 6 Click Score: 16    End of Session    OT Visit Diagnosis: Unsteadiness on feet (R26.81);Muscle weakness (generalized) (M62.81)   Activity Tolerance Patient tolerated treatment well   Patient Left in bed;with call bell/phone within reach   Nurse Communication          Time: 1245-8099 OT Time Calculation (min): 13 min  Charges: OT General Charges $OT Visit: 1 Visit OT Treatments $Self Care/Home Management : 8-22 mins  Dessie Coma, M.S. OTR/L  07/31/22, 9:14 AM  ascom [phone]

## 2022-07-31 NOTE — TOC Progression Note (Signed)
Transition of Care [facility]) - Progression Note    Patient Details  Name: Madison Mcbride MRN: [id number] Date of Birth: 1955/12/01  Transition of Care [facility]) CM/SW [employer], [place] Phone Number: 07/31/2022, 10:13 AM  Clinical Narrative:     TOC following for discharge readiness to return to [facility] at discharge followed by outpatient palliative. Patient and spouse aware they will continue to private pay at [facility].     Expected Discharge Plan: [place] Barriers to Discharge: Continued Medical Work up  Expected Discharge Plan and Services   Discharge Planning Services: CM Consult Post Acute Care Choice: Resumption of Svcs/PTA Provider Living arrangements for the past 2 months: [place]                 DME Arranged: N/A DME Agency: NA       HH Arranged: NA [place] Agency: NA         Social Determinants of Health (SDOH) Interventions [place]: No Food Insecurity (07/18/2022)  Housing: Low Risk  (07/18/2022)  Transportation Needs: No Transportation Needs (07/18/2022)  Utilities: Not At Risk (07/18/2022)  Tobacco Use: Low Risk  (08/08/2022)    Readmission Risk Interventions    06/13/2022   12:43 PM 03/07/2022    4:12 PM  Readmission Risk Prevention Plan  Transportation Screening Complete Complete  PCP or Specialist Appt within 5-7 Days  Complete  Home Care Screening  Complete  Medication Review (RN CM)  Complete  Medication Review (RN Care Manager) Complete   PCP or Specialist appointment within 3-5 days of discharge Complete   SW Recovery Care/Counseling Consult Complete   Skilled Nursing Facility Complete

## 2022-07-31 NOTE — Progress Notes (Signed)
Pt took her BIPAP off at 400 and refused to have it back on. Pt agreed to have [place] on. Cardizem was given two times during this shift for tachycardia.

## 2022-07-31 NOTE — Progress Notes (Signed)
[facility] Cardiology    SUBJECTIVE: Patient states she feels reasonably well and seems somewhat confused today denies any chest pain palpitations   Vitals:   07/30/22 2200 07/30/22 2244 07/30/22 2349 07/31/22 0407  BP: 107/89  109/75 119/79  Pulse: (!) 124  (!) 107 (!) 129  Resp: '20  20 18  '$ Temp: 98.1 F (36.7 C)  98.2 F (36.8 C) 98.6 F (37 C)  TempSrc: Oral   Oral  SpO2: 91% 93% 96% 96%  Weight:      Height:         Intake/Output Summary (Last 24 hours) at 07/31/2022 0710 Last data filed at 07/31/2022 0427 Gross per 24 hour  Intake 423.14 ml  Output 1350 ml  Net -926.86 ml      PHYSICAL EXAM  General: Well developed, well nourished, in no acute distress HEENT:  Normocephalic and atramatic Neck:  No JVD.  Lungs: Clear bilaterally to auscultation and percussion. Heart: Tachycardia. Normal S1 and S2 without gallops or murmurs.  Abdomen: Bowel sounds are positive, abdomen soft and non-tender  Msk:  Back normal, normal gait. Normal strength and tone for age. Extremities: No clubbing, cyanosis or edema.   Neuro: Alert and oriented X 3. Psych:  Good affect, responds appropriately   LABS: Basic Metabolic Panel: Recent Labs    07/30/22 0700 07/31/22 0533  NA 136 140  K 3.0* 4.2  CL 89* 95*  CO2 38* 34*  GLUCOSE 131* 126*  BUN 40* 32*  CREATININE 1.00 0.99  CALCIUM 8.2* 8.7*  MG 1.8 1.9   Liver Function Tests: No results for input(s): "AST", "ALT", "ALKPHOS", "BILITOT", "PROT", "ALBUMIN" in the last 72 hours. No results for input(s): "LIPASE", "AMYLASE" in the last 72 hours. CBC: Recent Labs    07/30/22 0700 07/31/22 0533  WBC 9.7 14.3*  HGB 10.8* 12.0  HCT 37.6 42.8  MCV 89.1 91.1  PLT 333 363   Cardiac Enzymes: No results for input(s): "CKTOTAL", "CKMB", "CKMBINDEX", "TROPONINI" in the last 72 hours. BNP: Invalid input(s): "POCBNP" D-Dimer: No results for input(s): "DDIMER" in the last 72 hours. Hemoglobin A1C: No results for input(s): "HGBA1C" in  the last 72 hours. Fasting Lipid Panel: No results for input(s): "CHOL", "HDL", "LDLCALC", "TRIG", "CHOLHDL", "LDLDIRECT" in the last 72 hours. Thyroid Function Tests: No results for input(s): "TSH", "T4TOTAL", "T3FREE", "THYROIDAB" in the last 72 hours.  Invalid input(s): "FREET3" Anemia Panel: No results for input(s): "VITAMINB12", "FOLATE", "FERRITIN", "TIBC", "IRON", "RETICCTPCT" in the last 72 hours.  No results found.   Echo severely depressed left ventricular function EF around 25 to 30%  TELEMETRY: Tachycardia probably atrial fibrillation/atrial flutter rate of 125:  ASSESSMENT AND PLAN:  Principal Problem:   Paroxysmal atrial fibrillation with RVR (HCC) Active Problems:   Type 2 diabetes mellitus with diabetic neuropathy, without long-term current use of insulin (HCC)   Hypothyroidism   Acute metabolic encephalopathy   HLD (hyperlipidemia)   Elevated troponin   OSA (obstructive sleep apnea)   Acute respiratory failure with hypoxia (HCC)   Acute on chronic systolic CHF (congestive heart failure) (HCC)   Acute on chronic respiratory failure with hypoxia and hypercapnia (HCC)   AKI (acute kidney injury) ([place])   Thoracic aortic aneurysm (HCC)   Hyperkalemia   Hypotension   Leukocytosis   Normocytic anemia   Acute pulmonary edema (HCC)   HAP (hospital-acquired pneumonia)   Goals of care, counseling/discussion    Plan Atrial for ablation rapid ventricular response possibly atrial flutter would recommend adding  amiodarone increasing rate control with metoprolol and Cardizem Hypotension intermittent recurrent recommend increasing midodrine Continue modest hydration because of renal insufficiency CPAP BiPAP for obstructive sleep apnea type symptoms Acute respiratory failure with hypoxemia significantly improved Hyperlipidemia continue statin therapy for lipid management Recommend physical and Occupational Therapy   Yolonda Kida, MD 07/31/2022 7:10  AM

## 2022-07-31 NOTE — Plan of Care (Signed)
  Problem: Education: Goal: Knowledge of General Education information will improve Description: Including pain rating scale, medication(s)/side effects and non-pharmacologic comfort measures Outcome: Not Progressing   Problem: Health Behavior/Discharge Planning: Goal: Ability to manage health-related needs will improve Outcome: Not Progressing   Problem: Clinical Measurements: Goal: Ability to maintain clinical measurements within normal limits will improve Outcome: Progressing Goal: Will remain free from infection Outcome: Progressing Goal: Diagnostic test results will improve Outcome: Progressing Goal: Respiratory complications will improve Outcome: Progressing Goal: Cardiovascular complication will be avoided Outcome: Progressing   Problem: Activity: Goal: Risk for activity intolerance will decrease Outcome: Not Progressing   Problem: Nutrition: Goal: Adequate nutrition will be maintained Outcome: Progressing   Problem: Coping: Goal: Level of anxiety will decrease Outcome: Progressing   Problem: Elimination: Goal: Will not experience complications related to bowel motility Outcome: Progressing Goal: Will not experience complications related to urinary retention Outcome: Progressing   Problem: Pain Managment: Goal: General experience of comfort will improve Outcome: Progressing   Problem: Safety: Goal: Ability to remain free from injury will improve Outcome: Progressing   Problem: Skin Integrity: Goal: Risk for impaired skin integrity will decrease Outcome: Progressing

## 2022-08-01 ENCOUNTER — Inpatient Hospital Stay: Payer: Medicare Other

## 2022-08-01 DIAGNOSIS — I5023 Acute on chronic systolic (congestive) heart failure: Secondary | ICD-10-CM | POA: Diagnosis not present

## 2022-08-01 DIAGNOSIS — I480 Paroxysmal atrial fibrillation: Secondary | ICD-10-CM | POA: Diagnosis not present

## 2022-08-01 DIAGNOSIS — J9621 Acute and chronic respiratory failure with hypoxia: Secondary | ICD-10-CM | POA: Diagnosis not present

## 2022-08-01 DIAGNOSIS — J9622 Acute and chronic respiratory failure with hypercapnia: Secondary | ICD-10-CM | POA: Diagnosis not present

## 2022-08-01 LAB — CBC WITH DIFFERENTIAL/PLATELET
Abs Immature Granulocytes: 0.07 10*3/uL (ref 0.00–0.07)
Basophils Absolute: 0.1 10*3/uL (ref 0.0–0.1)
Basophils Relative: 1 %
Eosinophils Absolute: 0 10*3/uL (ref 0.0–0.5)
Eosinophils Relative: 0 %
HCT: 43.9 % (ref 36.0–46.0)
Hemoglobin: 12.6 g/dL (ref 12.0–15.0)
Immature Granulocytes: 1 %
Lymphocytes Relative: 12 %
Lymphs Abs: 1.7 10*3/uL (ref 0.7–4.0)
MCH: 25.8 pg — ABNORMAL LOW (ref 26.0–34.0)
MCHC: 28.7 g/dL — ABNORMAL LOW (ref 30.0–36.0)
MCV: 90 fL (ref 80.0–100.0)
Monocytes Absolute: 1.4 10*3/uL — ABNORMAL HIGH (ref 0.1–1.0)
Monocytes Relative: 10 %
Neutro Abs: 10.6 10*3/uL — ABNORMAL HIGH (ref 1.7–7.7)
Neutrophils Relative %: 76 %
Platelets: 458 10*3/uL — ABNORMAL HIGH (ref 150–400)
RBC: 4.88 MIL/uL (ref 3.87–5.11)
RDW: 23.6 % — ABNORMAL HIGH (ref 11.5–15.5)
Smear Review: NORMAL
WBC: 13.9 10*3/uL — ABNORMAL HIGH (ref 4.0–10.5)
nRBC: 0 % (ref 0.0–0.2)

## 2022-08-01 LAB — BASIC METABOLIC PANEL
Anion gap: 17 — ABNORMAL HIGH (ref 5–15)
BUN: 45 mg/dL — ABNORMAL HIGH (ref 8–23)
CO2: 30 mmol/L (ref 22–32)
Calcium: 8.8 mg/dL — ABNORMAL LOW (ref 8.9–10.3)
Chloride: 93 mmol/L — ABNORMAL LOW (ref 98–111)
Creatinine, Ser: 1.63 mg/dL — ABNORMAL HIGH (ref 0.44–1.00)
GFR, Estimated: 34 mL/min — ABNORMAL LOW (ref >60)
Glucose, Bld: 116 mg/dL — ABNORMAL HIGH (ref 70–99)
Potassium: 4.8 mmol/L (ref 3.5–5.1)
Sodium: 140 mmol/L (ref 135–145)

## 2022-08-01 LAB — GLUCOSE, CAPILLARY
Glucose-Capillary: 113 mg/dL — ABNORMAL HIGH (ref 70–99)
Glucose-Capillary: 106 mg/dL — ABNORMAL HIGH (ref 70–99)

## 2022-08-01 LAB — MAGNESIUM: Magnesium: 2.7 mg/dL — ABNORMAL HIGH (ref 1.7–2.4)

## 2022-08-01 MED ORDER — LACTATED RINGERS IV SOLN: INTRAVENOUS | Status: DC

## 2022-08-01 MED ORDER — AMIODARONE HCL 200 MG PO TABS
200.0000 mg | ORAL_TABLET | Freq: Two times a day (BID) | ORAL | Status: AC
  Administered 2022-08-01 – 2022-08-07 (×14): 200 mg via ORAL
  Filled 2022-08-01 (×14): qty 1

## 2022-08-01 MED ORDER — TORSEMIDE 20 MG PO TABS
20.0000 mg | ORAL_TABLET | Freq: Every day | ORAL | Status: DC
  Administered 2022-08-01: 20 mg via ORAL
  Filled 2022-08-01: qty 1

## 2022-08-01 NOTE — Progress Notes (Addendum)
Progress Note    Madison Mcbride  UUV:[id number] DOB: January 16, 1956  DOA: 08/01/2022 PCP: Rusty Aus, MD      Brief Narrative:    Medical records reviewed and are as summarized below:  Madison Mcbride is a 67 y.o. female with medical history significant for morbid obesity, chronic diastolic CHF, hypothyroidism, type II DM, obstructive sleep apnea, lymphedema, CKD stage IIIa, OSA, chronic hypoxic and hypercapnic respiratory failure on 2 L/min oxygen and trilogy machine at night.       Assessment/Plan:   Principal Problem:   Paroxysmal atrial fibrillation with RVR (HCC) Active Problems:   Acute on chronic respiratory failure with hypoxia and hypercapnia (HCC)   Acute metabolic encephalopathy   Acute on chronic systolic CHF (congestive heart failure) (HCC)   HLD (hyperlipidemia)   OSA (obstructive sleep apnea)   Type 2 diabetes mellitus with diabetic neuropathy, without long-term current use of insulin (HCC)   Elevated troponin   Hypothyroidism   Acute respiratory failure with hypoxia (HCC)   AKI (acute kidney injury) ([place])   Thoracic aortic aneurysm (HCC)   Hyperkalemia   Hypotension   Leukocytosis   Normocytic anemia   Acute pulmonary edema (HCC)   HAP (hospital-acquired pneumonia)   Goals of care, counseling/discussion   Delirium   Body mass index is 48.98 kg/m.  (Morbid obesity)   Acute on chronic systolic CHF, pulmonary hypertension: Hold torsemide because creatinine went up again.  2D echo showed EF estimated at 25 to 30%, RVSP 50.7 mmHg, BNP 2,302   AKI on CKD stage IIIa: Creatinine is trending up again.  Creatinine went up from 0.99-1.63.  Start Ringer's lactate infusion at 50 mL/h.  Reengaged nephrologist, Dr. Candiss Norse, to assist with management.   Acute on chronic hypoxic and hypercapnic respiratory failure, OHS, OSA: Continue oxygen via nasal cannula during the day and BiPAP at night or during sleep.  She uses 2 L/min oxygen at baseline.      Atrial fibrillation with RVR: Rate is better today.  IV amiodarone has been switched to oral amiodarone.  Continue Cardizem and metoprolol. Follow-up with cardiologist.  Hypotension: Continue midodrine  Hypokalemia: Improved  Acute toxic metabolic encephalopathy, delirium: No episodes of agitation reported overnight.  Continue low-dose Haldol.  Recent hypoglycemia: Resolved   Other comorbidities include type II DM, restless leg syndrome, hypothyroidism, chronic venous stasis, anxiety, depression, chronic pain      Diet Order             Diet 2 gram sodium Room service appropriate? No; Fluid consistency: Thin  Diet effective now                            Consultants: Nephrologist Cardiologist  Procedures: None    Medications:    amiodarone  200 mg Oral BID   apixaban  5 mg Oral BID   atorvastatin  20 mg Oral Daily   budesonide (PULMICORT) nebulizer solution  0.25 mg Nebulization BID   buPROPion  150 mg Oral Daily   diltiazem  60 mg Oral Q6H   feeding supplement  237 mL Oral BID BM   haloperidol  0.5 mg Oral BID   hydrocerin   Topical BID   ipratropium-albuterol  3 mL Nebulization BID   isosorbide mononitrate  15 mg Oral BID   levothyroxine  250 mcg Oral Q0600   metoprolol tartrate  50 mg Oral TID   midodrine  10 mg Oral  TID WC   pantoprazole  40 mg Oral Daily   rOPINIRole  1 mg Oral QHS   torsemide  20 mg Oral Daily   Continuous Infusions:      Anti-infectives (From admission, onward)    Start     Dose/Rate Route Frequency Ordered Stop   07/25/22 1200  amoxicillin-clavulanate (AUGMENTIN) 500-125 MG per tablet 1 tablet        1 tablet Oral 2 times daily 07/25/22 1102 07/26/22 2117   07/24/22 1400  Ampicillin-Sulbactam (UNASYN) 3 g in sodium chloride 0.9 % 100 mL IVPB  Status:  Discontinued        3 g 200 mL/hr over 30 Minutes Intravenous Every 6 hours 07/24/22 1228 07/24/22 1231   07/24/22 1330  amoxicillin-clavulanate (AUGMENTIN)  875-125 MG per tablet 1 tablet  Status:  Discontinued        1 tablet Oral Every 12 hours 07/24/22 1231 07/25/22 1102   07/21/22 1500  Ampicillin-Sulbactam (UNASYN) 3 g in sodium chloride 0.9 % 100 mL IVPB        3 g 200 mL/hr over 30 Minutes Intravenous Every 6 hours 07/21/22 1357 07/22/22 2216   07/18/22 1900  azithromycin (ZITHROMAX) 500 mg in sodium chloride 0.9 % 250 mL IVPB  Status:  Discontinued        500 mg 250 mL/hr over 60 Minutes Intravenous Every 24 hours 07/18/22 0205 07/19/22 1204   07/18/22 1000  piperacillin-tazobactam (ZOSYN) IVPB 3.375 g  Status:  Discontinued        3.375 g 12.5 mL/hr over 240 Minutes Intravenous Every 8 hours 07/18/22 0842 07/21/22 1357   07/18/22 0800  cefTRIAXone (ROCEPHIN) 1 g in sodium chloride 0.9 % 100 mL IVPB  Status:  Discontinued        1 g 200 mL/hr over 30 Minutes Intravenous Every 24 hours 07/18/22 0205 07/18/22 0829   07/29/2022 1915  vancomycin (VANCOREADY) IVPB 2000 mg/400 mL        2,000 mg 200 mL/hr over 120 Minutes Intravenous  Once 07/18/2022 1914 08/10/2022 2250   08/06/2022 1900  ceFEPIme (MAXIPIME) 2 g in sodium chloride 0.9 % 100 mL IVPB        2 g 200 mL/hr over 30 Minutes Intravenous  Once 07/22/2022 1853 07/18/22 1900   07/21/2022 1900  azithromycin (ZITHROMAX) 500 mg in sodium chloride 0.9 % 250 mL IVPB        500 mg 250 mL/hr over 60 Minutes Intravenous  Once 07/23/2022 1853 07/18/22 1900              Family Communication/Anticipated D/C date and plan/Code Status   DVT prophylaxis:  apixaban (ELIQUIS) tablet 5 mg     Code Status: Full Code  Family Communication: Husband over the phone Disposition Plan: Plan to discharge to nursing home in 3 to 4 days   Status is: Inpatient Remains inpatient appropriate because: Rapid atrial fibrillation, delirium/altered mental status       Subjective:   Interval events noted.  She is unable to provide any clear history  Objective:    Vitals:   08/01/22 1004 08/01/22 1100  08/01/22 1200 08/01/22 1229  BP: 127/86 96/73 108/82 (!) 84/63  Pulse: (!) 115   (!) 104  Resp: (!) '21 20 19 '$ (!) 24  Temp:    98.1 F (36.7 C)  TempSrc:    Oral  SpO2:      Weight:      Height:       No data  found.   Intake/Output Summary (Last 24 hours) at 08/01/2022 1401 Last data filed at 08/01/2022 0539 Gross per 24 hour  Intake 785.6 ml  Output 450 ml  Net 335.6 ml   Filed Weights   07/23/22 0500 07/28/22 0904 08/01/22 0500  Weight: 135.8 kg (!) 137.5 kg 133.5 kg    Exam:   GEN: NAD SKIN: Warm and dry EYES: EOMI ENT: MMM CV: Irregular rate and rhythm, tachycardic PULM: CTA B ABD: soft, obese, NT, +BS CNS: AAO x 2 (person and place), non focal EXT: No edema or tenderness     Data Reviewed:   I have personally reviewed following labs and imaging studies:  Labs: Labs show the following:   Basic Metabolic Panel: Recent Labs  Lab 07/27/22 0506 07/28/22 0547 07/29/22 0626 07/30/22 0700 07/31/22 0533 08/01/22 1322  NA 135 137 137 136 140 140  K 3.9 3.2* 3.1* 3.0* 4.2 4.8  CL 93* 93* 92* 89* 95* 93*  CO2 29 30 35* 38* 34* 30  GLUCOSE 69* 77 91 131* 126* 116*  BUN 69* 62* 51* 40* 32* 45*  CREATININE 2.06* 1.60* 1.23* 1.00 0.99 1.63*  CALCIUM 8.2* 8.4* 8.4* 8.2* 8.7* 8.8*  MG  --  2.3 2.1 1.8 1.9 2.7*  PHOS 4.8*  --   --   --   --   --    GFR Estimated Creatinine Clearance: 45.7 mL/min (A) (by C-G formula based on SCr of 1.63 mg/dL (H)). Liver Function Tests: Recent Labs  Lab 07/27/22 0506  ALBUMIN 3.1*   No results for input(s): "LIPASE", "AMYLASE" in the last 168 hours. No results for input(s): "AMMONIA" in the last 168 hours. Coagulation profile No results for input(s): "INR", "PROTIME" in the last 168 hours.  CBC: Recent Labs  Lab 07/28/22 0547 07/29/22 0626 07/30/22 0700 07/31/22 0533 08/01/22 1322  WBC 8.2 8.4 9.7 14.3* 13.9*  NEUTROABS  --   --   --   --  10.6*  HGB 10.6* 10.4* 10.8* 12.0 12.6  HCT 35.6* 37.1 37.6 42.8 43.9   MCV 86.6 90.0 89.1 91.1 90.0  PLT 343 330 333 363 458*   Cardiac Enzymes: No results for input(s): "CKTOTAL", "CKMB", "CKMBINDEX", "TROPONINI" in the last 168 hours. BNP (last 3 results) No results for input(s): "PROBNP" in the last 8760 hours. CBG: Recent Labs  Lab 07/30/22 0828 07/31/22 0118 07/31/22 0826 07/31/22 2325 08/01/22 0849  GLUCAP 142* 121* 126* 115* 113*   D-Dimer: No results for input(s): "DDIMER" in the last 72 hours. Hgb A1c: No results for input(s): "HGBA1C" in the last 72 hours. Lipid Profile: No results for input(s): "CHOL", "HDL", "LDLCALC", "TRIG", "CHOLHDL", "LDLDIRECT" in the last 72 hours. Thyroid function studies: No results for input(s): "TSH", "T4TOTAL", "T3FREE", "THYROIDAB" in the last 72 hours.  Invalid input(s): "FREET3" Anemia work up: No results for input(s): "VITAMINB12", "FOLATE", "FERRITIN", "TIBC", "IRON", "RETICCTPCT" in the last 72 hours. Sepsis Labs: Recent Labs  Lab 07/29/22 0626 07/30/22 0700 07/31/22 0533 08/01/22 1322  WBC 8.4 9.7 14.3* 13.9*    Microbiology Recent Results (from the past 240 hour(s))  Urine Culture     Status: None   Collection Time: 07/23/22  6:00 AM   Specimen: Urine, Clean Catch  Result Value Ref Range Status   Specimen Description   Final    URINE, CLEAN CATCH Performed at [facility], [address] [place], [place] [address]    Special Requests   Final    Normal Performed at  [facility], [address], [place], [place] [address]    Culture   Final    NO GROWTH Performed at [facility], [place] [address] [place], [place] [address]    Report Status 07/24/2022 FINAL  Final    Procedures and diagnostic studies:  No results found.             LOS: 15 days   [place] Copywriter, advertising on [email or web address]. If 7PM-7AM, please contact night-coverage at [email or web address]     08/01/2022, 2:01 PM

## 2022-08-01 NOTE — Progress Notes (Signed)
[facility] Cardiology  SUBJECTIVE: Patient laying in bed, denies chest pain or shortness of breath   Vitals:   08/01/22 0315 08/01/22 0500 08/01/22 0822 08/01/22 0846  BP: (!) 112/96   (!) 143/91  Pulse: (!) 105   (!) 103  Resp: 17   19  Temp:    98.4 F (36.9 C)  TempSrc: Oral   Oral  SpO2: 92%  92% 92%  Weight:  133.5 kg    Height:         Intake/Output Summary (Last 24 hours) at 08/01/2022 0943 Last data filed at 08/01/2022 0539 Gross per 24 hour  Intake 1025.6 ml  Output 450 ml  Net 575.6 ml      PHYSICAL EXAM  General: Well developed, well nourished, in no acute distress HEENT:  Normocephalic and atramatic Neck:  No JVD.  Lungs: Clear bilaterally to auscultation and percussion. Heart: HRRR . Normal S1 and S2 without gallops or murmurs.  Abdomen: Bowel sounds are positive, abdomen soft and non-tender  Msk:  Back normal, normal gait. Normal strength and tone for age. Extremities: No clubbing, cyanosis or edema.   Neuro: Alert and oriented X 3. Psych:  Good affect, responds appropriately   LABS: Basic Metabolic Panel: Recent Labs    07/30/22 0700 07/31/22 0533  NA 136 140  K 3.0* 4.2  CL 89* 95*  CO2 38* 34*  GLUCOSE 131* 126*  BUN 40* 32*  CREATININE 1.00 0.99  CALCIUM 8.2* 8.7*  MG 1.8 1.9   Liver Function Tests: No results for input(s): "AST", "ALT", "ALKPHOS", "BILITOT", "PROT", "ALBUMIN" in the last 72 hours. No results for input(s): "LIPASE", "AMYLASE" in the last 72 hours. CBC: Recent Labs    07/30/22 0700 07/31/22 0533  WBC 9.7 14.3*  HGB 10.8* 12.0  HCT 37.6 42.8  MCV 89.1 91.1  PLT 333 363   Cardiac Enzymes: No results for input(s): "CKTOTAL", "CKMB", "CKMBINDEX", "TROPONINI" in the last 72 hours. BNP: Invalid input(s): "POCBNP" D-Dimer: No results for input(s): "DDIMER" in the last 72 hours. Hemoglobin A1C: No results for input(s): "HGBA1C" in the last 72 hours. Fasting Lipid Panel: No results for input(s): "CHOL", "HDL", "LDLCALC",  "TRIG", "CHOLHDL", "LDLDIRECT" in the last 72 hours. Thyroid Function Tests: No results for input(s): "TSH", "T4TOTAL", "T3FREE", "THYROIDAB" in the last 72 hours.  Invalid input(s): "FREET3" Anemia Panel: No results for input(s): "VITAMINB12", "FOLATE", "FERRITIN", "TIBC", "IRON", "RETICCTPCT" in the last 72 hours.  No results found.   Echo EF 25-30% 03/06/2022  TELEMETRY: Atrial flutter 110 bpm:  ASSESSMENT AND PLAN:  Principal Problem:   Paroxysmal atrial fibrillation with RVR (HCC) Active Problems:   Type 2 diabetes mellitus with diabetic neuropathy, without long-term current use of insulin (HCC)   Hypothyroidism   Acute metabolic encephalopathy   HLD (hyperlipidemia)   Elevated troponin   OSA (obstructive sleep apnea)   Acute respiratory failure with hypoxia (HCC)   Acute on chronic systolic CHF (congestive heart failure) (HCC)   Acute on chronic respiratory failure with hypoxia and hypercapnia (HCC)   AKI (acute kidney injury) ([place])   Thoracic aortic aneurysm (HCC)   Hyperkalemia   Hypotension   Leukocytosis   Normocytic anemia   Acute pulmonary edema (HCC)   HAP (hospital-acquired pneumonia)   Goals of care, counseling/discussion   Delirium    1.  Atrial flutter with RVR, on Eliquis for stroke prevention, and diltiazem 60 mg every 6, metoprolol tartrate 50 mg 3 times daily, and amiodarone infusion for rate control 2.  Acute on chronic systolic congestive heart failure, HFrEF, EF 25-30%/20 11/2021, on torsemide 20 mg daily and good medical management as tolerated (metoprolol to tartrate, isosorbide mononitrate) 3.  Chronic kidney disease stage IIIa  Recommendations  1.  Agree with current therapy 2.  Continue torsemide 20 mg daily 3.  Transition amiodarone drip to amiodarone 200 mg p.o. twice daily 4.  Continue Eliquis for stroke prevention 5.  Continue metoprolol to tartrate and diltiazem for rate control   Isaias Cowman, MD, PhD, [facility] 08/01/2022 9:43  AM

## 2022-08-02 DIAGNOSIS — E875 Hyperkalemia: Secondary | ICD-10-CM | POA: Diagnosis not present

## 2022-08-02 DIAGNOSIS — N179 Acute kidney failure, unspecified: Secondary | ICD-10-CM | POA: Diagnosis not present

## 2022-08-02 DIAGNOSIS — J9621 Acute and chronic respiratory failure with hypoxia: Secondary | ICD-10-CM | POA: Diagnosis not present

## 2022-08-02 DIAGNOSIS — J9622 Acute and chronic respiratory failure with hypercapnia: Secondary | ICD-10-CM | POA: Diagnosis not present

## 2022-08-02 DIAGNOSIS — I480 Paroxysmal atrial fibrillation: Secondary | ICD-10-CM | POA: Diagnosis not present

## 2022-08-02 LAB — BASIC METABOLIC PANEL
Anion gap: 21 — ABNORMAL HIGH (ref 5–15)
BUN: 54 mg/dL — ABNORMAL HIGH (ref 8–23)
CO2: 25 mmol/L (ref 22–32)
Calcium: 9 mg/dL (ref 8.9–10.3)
Chloride: 97 mmol/L — ABNORMAL LOW (ref 98–111)
Creatinine, Ser: 2.16 mg/dL — ABNORMAL HIGH (ref 0.44–1.00)
GFR, Estimated: 24 mL/min — ABNORMAL LOW (ref >60)
Glucose, Bld: 29 mg/dL — CL (ref 70–99)
Potassium: 5.5 mmol/L — ABNORMAL HIGH (ref 3.5–5.1)
Sodium: 143 mmol/L (ref 135–145)

## 2022-08-02 LAB — GLUCOSE, CAPILLARY
Glucose-Capillary: 78 mg/dL (ref 70–99)
Glucose-Capillary: 88 mg/dL (ref 70–99)
Glucose-Capillary: 56 mg/dL — ABNORMAL LOW (ref 70–99)
Glucose-Capillary: 150 mg/dL — ABNORMAL HIGH (ref 70–99)
Glucose-Capillary: 93 mg/dL (ref 70–99)

## 2022-08-02 LAB — CBC WITH DIFFERENTIAL/PLATELET
Abs Immature Granulocytes: 0.03 10*3/uL (ref 0.00–0.07)
Basophils Absolute: 0.1 10*3/uL (ref 0.0–0.1)
Basophils Relative: 1 %
Eosinophils Absolute: 0 10*3/uL (ref 0.0–0.5)
Eosinophils Relative: 0 %
HCT: 47.8 % — ABNORMAL HIGH (ref 36.0–46.0)
Hemoglobin: 13.2 g/dL (ref 12.0–15.0)
Immature Granulocytes: 0 %
Lymphocytes Relative: 10 %
Lymphs Abs: 1.1 10*3/uL (ref 0.7–4.0)
MCH: 25.6 pg — ABNORMAL LOW (ref 26.0–34.0)
MCHC: 27.6 g/dL — ABNORMAL LOW (ref 30.0–36.0)
MCV: 92.8 fL (ref 80.0–100.0)
Monocytes Absolute: 1 10*3/uL (ref 0.1–1.0)
Monocytes Relative: 9 %
Neutro Abs: 8.8 10*3/uL — ABNORMAL HIGH (ref 1.7–7.7)
Neutrophils Relative %: 80 %
Platelets: 444 10*3/uL — ABNORMAL HIGH (ref 150–400)
RBC: 5.15 MIL/uL — ABNORMAL HIGH (ref 3.87–5.11)
RDW: 23.7 % — ABNORMAL HIGH (ref 11.5–15.5)
Smear Review: NORMAL
WBC: 11 10*3/uL — ABNORMAL HIGH (ref 4.0–10.5)
nRBC: 0.2 % (ref 0.0–0.2)

## 2022-08-02 LAB — BLOOD GAS, ARTERIAL

## 2022-08-02 MED ORDER — METOPROLOL TARTRATE 50 MG PO TABS
50.0000 mg | ORAL_TABLET | Freq: Two times a day (BID) | ORAL | Status: DC
  Administered 2022-08-02 – 2022-08-12 (×19): 50 mg via ORAL
  Filled 2022-08-02 (×20): qty 1

## 2022-08-02 MED ORDER — DEXTROSE 50 % IV SOLN
1.0000 | Freq: Once | INTRAVENOUS | Status: AC
  Administered 2022-08-02: 50 mL via INTRAVENOUS
  Filled 2022-08-02: qty 50

## 2022-08-02 MED ORDER — DILTIAZEM HCL ER COATED BEADS 180 MG PO CP24
180.0000 mg | ORAL_CAPSULE | Freq: Every day | ORAL | Status: DC
  Administered 2022-08-03 – 2022-08-12 (×10): 180 mg via ORAL
  Filled 2022-08-02 (×10): qty 1

## 2022-08-02 MED ORDER — DEXTROSE-NACL 5-0.45 % IV SOLN: INTRAVENOUS | Status: DC

## 2022-08-02 NOTE — Progress Notes (Signed)
PT Cancellation Note  Patient Details Name: Madison Mcbride MRN: [id number] DOB: 01-19-1956   Cancelled Treatment:    Reason Eval/Treat Not Completed: Medical issues which prohibited therapy: Pt's BG 29 and K trending up to 5.5.  Pt on Bipap with nursing contacted and stated pt with decreased respiratory status at this time, PT to be held this date.  Will attempt to see pt at a future date/time as medically appropriate.    Linus Salmons PT, DPT 08/02/22, 10:52 AM

## 2022-08-02 NOTE — Progress Notes (Signed)
[facility]  ROUNDING NOTE   Subjective:   Madison Mcbride with a past medical history of chronic diastolic heart failure, morbid obesity, hypothyroidism, diabetes mellitus type 2, obstructive sleep apnea, lymphedema, and chronic kidney disease stage IIIa.  Patient presents to the emergency department with complaints of shortness of breath.  She has been admitted for Acute pulmonary edema (HCC) [J81.0] Atrial fibrillation with RVR (HCC) [I48.91] HCAP (healthcare-associated pneumonia) [J18.9] Acute on chronic respiratory failure with hypoxia and hypercapnia ([place]) [E45.40, J96.22]  Patient is known to our practice and is seen outpatient by Dr. Holley Raring.  Patient was last seen in our office on April 03, 2021.  Patient has been lost to follow-up since that time.    Patient somnolent on BiPAP   Nephrology previously signed off of patients case due to renal recover, however patients renal function has declined with creatinine increasing so we are now following.       Objective:  Vital signs in last 24 hours:  Temp:  [97.4 F (36.3 C)-98.1 F (36.7 C)] 97.8 F (36.6 C) (01/21 0843) Pulse Rate:  [80-104] 84 (01/21 0900) Resp:  [14-27] 24 (01/21 0900) BP: (72-126)/(49-82) 126/79 (01/21 0904) SpO2:  [91 %-99 %] 99 % (01/21 0900) FiO2 (%):  [40 %] 40 % (01/21 0904)  Weight change:  Filed Weights   07/23/22 0500 07/28/22 0904 08/01/22 0500  Weight: 135.8 kg (!) 137.5 kg 133.5 kg    Intake/Output: I/O last 3 completed shifts: In: 873.4 [I.V.:873.4] Out: 450 [Urine:450]   Intake/Output this shift:  No intake/output data recorded.  Physical Exam: General: somnolent  Head: Normocephalic, atraumatic. Moist oral mucosal membranes  Eyes: Anicteric  Lungs:  +crackles, +wheezes   Heart: Regular rate and rhythm  Abdomen:  Soft, nontender, obese  Extremities: No peripheral edema.  Neurologic: Alert, able to move all four extremities  Skin: No lesions  Access:  None    Basic Metabolic Panel: Recent Labs  Lab 07/27/22 0506 07/28/22 0547 07/29/22 0626 07/30/22 0700 07/31/22 0533 08/01/22 1322 08/02/22 0901  NA 135 137 137 136 140 140 143  K 3.9 3.2* 3.1* 3.0* 4.2 4.8 5.5*  CL 93* 93* 92* 89* 95* 93* 97*  CO2 29 30 35* 38* 34* 30 25  GLUCOSE 69* 77 91 131* 126* 116* 29*  BUN 69* 62* 51* 40* 32* 45* 54*  CREATININE 2.06* 1.60* 1.23* 1.00 0.99 1.63* 2.16*  CALCIUM 8.2* 8.4* 8.4* 8.2* 8.7* 8.8* 9.0  MG  --  2.3 2.1 1.8 1.9 2.7*  --   PHOS 4.8*  --   --   --   --   --   --      Liver Function Tests: Recent Labs  Lab 07/27/22 0506  ALBUMIN 3.1*    No results for input(s): "LIPASE", "AMYLASE" in the last 168 hours. No results for input(s): "AMMONIA" in the last 168 hours.  CBC: Recent Labs  Lab 07/29/22 0626 07/30/22 0700 07/31/22 0533 08/01/22 1322 08/02/22 0901  WBC 8.4 9.7 14.3* 13.9* 11.0*  NEUTROABS  --   --   --  10.6* 8.8*  HGB 10.4* 10.8* 12.0 12.6 13.2  HCT 37.1 37.6 42.8 43.9 47.8*  MCV 90.0 89.1 91.1 90.0 92.8  PLT 330 333 363 458* 444*     Cardiac Enzymes: No results for input(s): "CKTOTAL", "CKMB", "CKMBINDEX", "TROPONINI" in the last 168 hours.  BNP: Invalid input(s): "POCBNP"  CBG: Recent Labs  Lab 08/01/22 1805 08/02/22 0447 08/02/22 0848 08/02/22 9811 08/02/22  [place]     Microbiology: Results for orders placed or performed during the hospital encounter of 07/24/2022  Blood culture (routine x 2)     Status: None   Collection Time: 07/21/2022 11:01 PM   Specimen: BLOOD  Result Value Ref Range Status   Specimen Description BLOOD BLOOD RIGHT WRIST  Final   Special Requests   Final    BOTTLES DRAWN AEROBIC AND ANAEROBIC Blood Culture results may not be optimal due to an inadequate volume of blood received in culture bottles   Culture   Final    NO GROWTH 5 DAYS Performed at [facility], [address], [place], [place] [address]    Report Status  07/22/2022 FINAL  Final  Blood culture (routine x 2)     Status: None   Collection Time: 07/14/2022 11:01 PM   Specimen: BLOOD  Result Value Ref Range Status   Specimen Description BLOOD BLOOD LEFT HAND  Final   Special Requests IN PEDIATRIC BOTTLE Blood Culture adequate volume  Final   Culture   Final    NO GROWTH 5 DAYS Performed at [facility], [address], [place], [place] [address]    Report Status 07/22/2022 FINAL  Final  MRSA Next Gen by PCR, Nasal     Status: None   Collection Time: 07/18/22 10:11 AM   Specimen: Nasal Mucosa; Nasal Swab  Result Value Ref Range Status   MRSA by PCR Next Gen NOT DETECTED NOT DETECTED Final    Comment: (NOTE) The GeneXpert MRSA Assay (FDA approved for NASAL specimens only), is one component of a comprehensive MRSA colonization surveillance program. It is not intended to diagnose MRSA infection nor to guide or monitor treatment for MRSA infections. Test performance is not FDA approved in patients less than 50 years old. Performed at [facility], [place], [place] [address]   Respiratory (~20 pathogens) panel by PCR     Status: None   Collection Time: 07/18/22 11:07 AM   Specimen: Nasopharyngeal Swab; Respiratory  Result Value Ref Range Status   Adenovirus NOT DETECTED NOT DETECTED Final   Coronavirus 229E NOT DETECTED NOT DETECTED Final    Comment: (NOTE) The Coronavirus on the Respiratory Panel, DOES NOT test for the novel  Coronavirus (2019 nCoV)    Coronavirus HKU1 NOT DETECTED NOT DETECTED Final   Coronavirus NL63 NOT DETECTED NOT DETECTED Final   Coronavirus OC43 NOT DETECTED NOT DETECTED Final   Metapneumovirus NOT DETECTED NOT DETECTED Final   Rhinovirus / Enterovirus NOT DETECTED NOT DETECTED Final   Influenza A NOT DETECTED NOT DETECTED Final   Influenza B NOT DETECTED NOT DETECTED Final   Parainfluenza Virus 1 NOT DETECTED NOT DETECTED Final   Parainfluenza Virus 2 NOT DETECTED NOT  DETECTED Final   Parainfluenza Virus 3 NOT DETECTED NOT DETECTED Final   Parainfluenza Virus 4 NOT DETECTED NOT DETECTED Final   Respiratory Syncytial Virus NOT DETECTED NOT DETECTED Final   Bordetella pertussis NOT DETECTED NOT DETECTED Final   Bordetella Parapertussis NOT DETECTED NOT DETECTED Final   Chlamydophila pneumoniae NOT DETECTED NOT DETECTED Final   Mycoplasma pneumoniae NOT DETECTED NOT DETECTED Final    Comment: Performed at [facility], [place]. [address], [place],  [address]  Urine Culture     Status: None   Collection Time: 07/23/22  6:00 AM   Specimen: Urine, Clean Catch  Result Value Ref Range Status  Specimen Description   Final    URINE, CLEAN CATCH Performed at [facility], [address], [place], [place] [address]    Special Requests   Final    Normal Performed at [facility], [address] [place], [place] [address]    Culture   Final    NO GROWTH Performed at [facility], [place] [address], [place],  [address]    Report Status 07/24/2022 FINAL  Final    Coagulation Studies: No results for input(s): "LABPROT", "INR" in the last 72 hours.  Urinalysis: No results for input(s): "COLORURINE", "LABSPEC", "PHURINE", "GLUCOSEU", "HGBUR", "BILIRUBINUR", "KETONESUR", "PROTEINUR", "UROBILINOGEN", "NITRITE", "LEUKOCYTESUR" in the last 72 hours.  Invalid input(s): "APPERANCEUR"    Imaging: CT HEAD WO CONTRAST (5MM)  Result Date: 08/01/2022 CLINICAL DATA:  Altered mental status EXAM: CT HEAD WITHOUT CONTRAST TECHNIQUE: Contiguous axial images were obtained from the base of the skull through the vertex without intravenous contrast. RADIATION DOSE REDUCTION: This exam was performed according to the departmental dose-optimization program which includes automated exposure control, adjustment of the mA and/or kV according to patient size and/or use of iterative reconstruction technique. COMPARISON:  None Available. FINDINGS:  Brain: There is no mass, hemorrhage or extra-axial collection. The size and configuration of the ventricles and extra-axial CSF spaces are normal. There is hypoattenuation of the white matter, most commonly indicating chronic small vessel disease. Vascular: Atherosclerotic calcification of the vertebral and internal carotid arteries at the skull base. No abnormal hyperdensity of the major intracranial arteries or dural venous sinuses. Skull: The visualized skull base, calvarium and extracranial soft tissues are normal. Sinuses/Orbits: No fluid levels or advanced mucosal thickening of the visualized paranasal sinuses. No mastoid or middle ear effusion. The orbits are normal. IMPRESSION: 1. No acute intracranial abnormality. 2. Chronic small vessel disease. Electronically Signed   By: Ulyses Jarred M.D.   On: 08/01/2022 22:29     Medications:    dextrose 5 % and 0.45% NaCl 50 mL/hr at 08/02/22 1044    amiodarone  200 mg Oral BID   apixaban  5 mg Oral BID   atorvastatin  20 mg Oral Daily   budesonide (PULMICORT) nebulizer solution  0.25 mg Nebulization BID   buPROPion  150 mg Oral Daily   diltiazem  180 mg Oral Daily   feeding supplement  237 mL Oral BID BM   haloperidol  0.5 mg Oral BID   hydrocerin   Topical BID   ipratropium-albuterol  3 mL Nebulization BID   isosorbide mononitrate  15 mg Oral BID   levothyroxine  250 mcg Oral Q0600   metoprolol tartrate  50 mg Oral BID   midodrine  10 mg Oral TID WC   pantoprazole  40 mg Oral Daily   rOPINIRole  1 mg Oral QHS   acetaminophen, guaiFENesin, levalbuterol, liver oil-zinc oxide, metoprolol tartrate, ondansetron (ZOFRAN) IV, polyethylene glycol, senna-docusate  Assessment/ Plan:  Ms. Madison Mcbride is a 67 y.o.  female  past medical history of chronic diastolic heart failure, morbid obesity, hypothyroidism, diabetes mellitus type 2, obstructive sleep apnea, lymphedema, and chronic kidney disease stage IIIa.  Patient presents to the  emergency department with complaints of shortness of breath.  She has been admitted for Acute pulmonary edema (HCC) [J81.0] Atrial fibrillation with RVR (HCC) [I48.91] HCAP (healthcare-associated pneumonia) [J18.9] Acute on chronic respiratory failure with hypoxia and hypercapnia (HCC) [Q22.29, J96.22]   Acute Kidney Injury on chronic kidney disease stage IIIa with baseline creatinine 1.35  and GFR of 54 on 07/16/22.  Acute kidney injury appears multifactorial due to hypotension, IV contrast exposure, aggressive diuresis. IV contrast exposure noted on 07/30/2022.  Renal ultrasound negative for obstruction.    Creatinine increased to 2.16   Change patient from LR to D5 1/2 NS due to hyperkalemia and hypoglycemia.   Lab Results  Component Value Date   CREATININE 2.16 (H) 08/02/2022   CREATININE 1.63 (H) 08/01/2022   CREATININE 0.99 07/31/2022    Intake/Output Summary (Last 24 hours) at 08/02/2022 1126 Last data filed at 08/02/2022 0441 Gross per 24 hour  Intake 873.43 ml  Output --  Net 873.43 ml    2.  Acute on chronic systolic heart failure.  Echo completed on 03/06/2022 shows EF 45 to 50% with a mild to moderate MVR, TVR, and AVR.  Cardiology following. Torsemide held  3. Anemia of chronic kidney disease  Normocytic Lab Results  Component Value Date   HGB 13.2 08/02/2022  Hemoglobin acceptable. Will continue to monitor.  4.  Hypertension with chronic kidney disease. Receiving diltiazem, hydralazine, isosorbide, Torsemide, and metoprolol.  Blood pressure 126/76. Also prescribed Midodrine '5mg'$  TID     LOS: [place] 1/21/202411:26 AM

## 2022-08-02 NOTE — IPAL (Signed)
  Interdisciplinary Goals of Care Family Meeting   Date carried out: 08/02/2022  Location of the meeting: Bedside  Member's involved: Physician and Family Member or next of kin husband, Madison Mcbride (son) and Madison Mcbride (son)  Durable Power of Forensic psychologist or acting medical decision maker: Madison Mcbride (spouse)  Discussion: We discussed goals of care for Madison Mcbride .  Diagnoses, prognosis and plan of care were discussed.  Code status:   Code Status: Full Code   Disposition: Continue current acute care  Time spent for the meeting: 15 minutes    Jennye Boroughs, MD  08/02/2022, 2:35 PM

## 2022-08-02 NOTE — Progress Notes (Addendum)
Progress Note    Madison Mcbride  ZWC:[id number] DOB: May 12, 1956  DOA: 07/20/2022 PCP: Rusty Aus, MD      Brief Narrative:    Medical records reviewed and are as summarized below:  Madison Mcbride is a 67 y.o. female with medical history significant for morbid obesity, chronic diastolic CHF, hypothyroidism, type II DM, obstructive sleep apnea, lymphedema, CKD stage IIIa, OSA, chronic hypoxic and hypercapnic respiratory failure on 2 L/min oxygen and trilogy machine at night.       Assessment/Plan:   Principal Problem:   Paroxysmal atrial fibrillation with RVR (HCC) Active Problems:   Acute on chronic respiratory failure with hypoxia and hypercapnia (HCC)   Acute metabolic encephalopathy   Acute on chronic systolic CHF (congestive heart failure) (HCC)   HLD (hyperlipidemia)   OSA (obstructive sleep apnea)   Type 2 diabetes mellitus with diabetic neuropathy, without long-term current use of insulin (HCC)   Elevated troponin   Hypothyroidism   Acute respiratory failure with hypoxia (HCC)   AKI (acute kidney injury) ([place])   Thoracic aortic aneurysm (HCC)   Hyperkalemia   Hypotension   Leukocytosis   Normocytic anemia   Acute pulmonary edema (HCC)   HAP (hospital-acquired pneumonia)   Goals of care, counseling/discussion   Delirium   Body mass index is 48.98 kg/m.  (Morbid obesity)   Acute on chronic systolic CHF, pulmonary hypertension: Torsemide on hold.  2D echo showed EF estimated at 25 to 30%, RVSP 50.7 mmHg, BNP 2,302   AKI on CKD stage IIIa, hyperkalemia: Worsening AKI.  Creatinine went from 1.63-2.16.  IV fluids changed from Ringer's lactate to D5 half-normal saline because of hypoglycemia and hyperkalemia.  Follow-up with nephrologist.  Acute on chronic hypoxic and hypercapnic respiratory failure, COPD, OHS, OSA: She has been on BiPAP since morning because of complaints of difficulty breathing.  Continue BiPAP as needed and nightly. She  uses 2 L/min oxygen at baseline.     Atrial fibrillation with RVR: Continue amiodarone, Cardizem and metoprolol.     Hypotension: Continue midodrine   Acute toxic metabolic encephalopathy, delirium: CT head obtained on 08/01/2022 did not show any acute abnormality.  No episodes of agitation reported overnight.  Plan to complete Haldol tonight.  Recent hypoglycemia, hypokalemia: Resolved   Pneumonia: Completed antibiotics on 07/25/2022.  Other comorbidities include type II DM, restless leg syndrome, hypothyroidism, chronic venous stasis, anxiety, depression, chronic pain      Diet Order             Diet 2 gram sodium Room service appropriate? No; Fluid consistency: Thin  Diet effective now                            Consultants: Nephrologist Cardiologist  Procedures: None    Medications:    amiodarone  200 mg Oral BID   apixaban  5 mg Oral BID   atorvastatin  20 mg Oral Daily   budesonide (PULMICORT) nebulizer solution  0.25 mg Nebulization BID   buPROPion  150 mg Oral Daily   diltiazem  180 mg Oral Daily   feeding supplement  237 mL Oral BID BM   haloperidol  0.5 mg Oral BID   hydrocerin   Topical BID   ipratropium-albuterol  3 mL Nebulization BID   isosorbide mononitrate  15 mg Oral BID   levothyroxine  250 mcg Oral Q0600   metoprolol tartrate  50 mg Oral BID  midodrine  10 mg Oral TID WC   pantoprazole  40 mg Oral Daily   rOPINIRole  1 mg Oral QHS   Continuous Infusions:  dextrose 5 % and 0.45% NaCl 50 mL/hr at 08/02/22 1044       Anti-infectives (From admission, onward)    Start     Dose/Rate Route Frequency Ordered Stop   07/25/22 1200  amoxicillin-clavulanate (AUGMENTIN) 500-125 MG per tablet 1 tablet        1 tablet Oral 2 times daily 07/25/22 1102 07/26/22 2117   07/24/22 1400  Ampicillin-Sulbactam (UNASYN) 3 g in sodium chloride 0.9 % 100 mL IVPB  Status:  Discontinued        3 g 200 mL/hr over 30 Minutes Intravenous Every 6  hours 07/24/22 1228 07/24/22 1231   07/24/22 1330  amoxicillin-clavulanate (AUGMENTIN) 875-125 MG per tablet 1 tablet  Status:  Discontinued        1 tablet Oral Every 12 hours 07/24/22 1231 07/25/22 1102   07/21/22 1500  Ampicillin-Sulbactam (UNASYN) 3 g in sodium chloride 0.9 % 100 mL IVPB        3 g 200 mL/hr over 30 Minutes Intravenous Every 6 hours 07/21/22 1357 07/22/22 2216   07/18/22 1900  azithromycin (ZITHROMAX) 500 mg in sodium chloride 0.9 % 250 mL IVPB  Status:  Discontinued        500 mg 250 mL/hr over 60 Minutes Intravenous Every 24 hours 07/18/22 0205 07/19/22 1204   07/18/22 1000  piperacillin-tazobactam (ZOSYN) IVPB 3.375 g  Status:  Discontinued        3.375 g 12.5 mL/hr over 240 Minutes Intravenous Every 8 hours 07/18/22 0842 07/21/22 1357   07/18/22 0800  cefTRIAXone (ROCEPHIN) 1 g in sodium chloride 0.9 % 100 mL IVPB  Status:  Discontinued        1 g 200 mL/hr over 30 Minutes Intravenous Every 24 hours 07/18/22 0205 07/18/22 0829   08/10/2022 1915  vancomycin (VANCOREADY) IVPB 2000 mg/400 mL        2,000 mg 200 mL/hr over 120 Minutes Intravenous  Once 08/02/2022 1914 08/03/2022 2250   07/14/2022 1900  ceFEPIme (MAXIPIME) 2 g in sodium chloride 0.9 % 100 mL IVPB        2 g 200 mL/hr over 30 Minutes Intravenous  Once 07/30/2022 1853 07/18/22 1900   07/13/2022 1900  azithromycin (ZITHROMAX) 500 mg in sodium chloride 0.9 % 250 mL IVPB        500 mg 250 mL/hr over 60 Minutes Intravenous  Once 07/20/2022 1853 07/18/22 1900              Family Communication/Anticipated D/C date and plan/Code Status   DVT prophylaxis:  apixaban (ELIQUIS) tablet 5 mg     Code Status: Full Code  Family Communication: Husband, 2 sons Madison Mcbride and Madison Mcbride) at the bedside Disposition Plan: Plan to discharge to nursing home when medically stable   Status is: Inpatient Remains inpatient appropriate because: Worsening kidney function      Subjective:   Interval events noted.  She is not  eating much.  Fingerstick glucose was 56 and glucose on BMP was 29 this morning.Leyshonte, RN, was at the bedside  Objective:    Vitals:   08/02/22 0900 08/02/22 0904 08/02/22 1212 08/02/22 1256  BP:  126/79 101/72   Pulse: 84  (!) 103 (!) 105  Resp: (!) 24  20 (!) 21  Temp:   97.8 F (36.6 C)   TempSrc:   Oral  SpO2: 99%  100% 98%  Weight:      Height:       No data found.   Intake/Output Summary (Last 24 hours) at 08/02/2022 1420 Last data filed at 08/02/2022 0441 Gross per 24 hour  Intake 873.43 ml  Output --  Net 873.43 ml   Filed Weights   07/23/22 0500 07/28/22 0904 08/01/22 0500  Weight: 135.8 kg (!) 137.5 kg 133.5 kg    Exam:  GEN: NAD SKIN: Warm and dry EYES: EOMI ENT: MMM CV: RRR PULM: CTA B ABD: soft, obese, NT, +BS CNS: AAO x person and place, she was able to tell me the time on the clock (9:33 AM), non focal EXT: No edema or tenderness     Data Reviewed:   I have personally reviewed following labs and imaging studies:  Labs: Labs show the following:   Basic Metabolic Panel: Recent Labs  Lab 07/27/22 0506 07/28/22 0547 07/29/22 0626 07/30/22 0700 07/31/22 0533 08/01/22 1322 08/02/22 0901  NA 135 137 137 136 140 140 143  K 3.9 3.2* 3.1* 3.0* 4.2 4.8 5.5*  CL 93* 93* 92* 89* 95* 93* 97*  CO2 29 30 35* 38* 34* 30 25  GLUCOSE 69* 77 91 131* 126* 116* 29*  BUN 69* 62* 51* 40* 32* 45* 54*  CREATININE 2.06* 1.60* 1.23* 1.00 0.99 1.63* 2.16*  CALCIUM 8.2* 8.4* 8.4* 8.2* 8.7* 8.8* 9.0  MG  --  2.3 2.1 1.8 1.9 2.7*  --   PHOS 4.8*  --   --   --   --   --   --    GFR Estimated Creatinine Clearance: 34.5 mL/min (A) (by C-G formula based on SCr of 2.16 mg/dL (H)). Liver Function Tests: Recent Labs  Lab 07/27/22 0506  ALBUMIN 3.1*   No results for input(s): "LIPASE", "AMYLASE" in the last 168 hours. No results for input(s): "AMMONIA" in the last 168 hours. Coagulation profile No results for input(s): "INR", "PROTIME" in the last 168  hours.  CBC: Recent Labs  Lab 07/29/22 0626 07/30/22 0700 07/31/22 0533 08/01/22 1322 08/02/22 0901  WBC 8.4 9.7 14.3* 13.9* 11.0*  NEUTROABS  --   --   --  10.6* 8.8*  HGB 10.4* 10.8* 12.0 12.6 13.2  HCT 37.1 37.6 42.8 43.9 47.8*  MCV 90.0 89.1 91.1 90.0 92.8  PLT 330 333 363 458* 444*   Cardiac Enzymes: No results for input(s): "CKTOTAL", "CKMB", "CKMBINDEX", "TROPONINI" in the last 168 hours. BNP (last 3 results) No results for input(s): "PROBNP" in the last 8760 hours. CBG: Recent Labs  Lab 08/01/22 1805 08/02/22 0447 08/02/22 0848 08/02/22 0938 08/02/22 1032  GLUCAP 106* 78 88 56* 150*   D-Dimer: No results for input(s): "DDIMER" in the last 72 hours. Hgb A1c: No results for input(s): "HGBA1C" in the last 72 hours. Lipid Profile: No results for input(s): "CHOL", "HDL", "LDLCALC", "TRIG", "CHOLHDL", "LDLDIRECT" in the last 72 hours. Thyroid function studies: No results for input(s): "TSH", "T4TOTAL", "T3FREE", "THYROIDAB" in the last 72 hours.  Invalid input(s): "FREET3" Anemia work up: No results for input(s): "VITAMINB12", "FOLATE", "FERRITIN", "TIBC", "IRON", "RETICCTPCT" in the last 72 hours. Sepsis Labs: Recent Labs  Lab 07/30/22 0700 07/31/22 0533 08/01/22 1322 08/02/22 0901  WBC 9.7 14.3* 13.9* 11.0*    Microbiology No results found for this or any previous visit (from the past 240 hour(s)).   Procedures and diagnostic studies:  CT HEAD WO CONTRAST (5MM)  Result Date: 08/01/2022 CLINICAL DATA:  Altered  mental status EXAM: CT HEAD WITHOUT CONTRAST TECHNIQUE: Contiguous axial images were obtained from the base of the skull through the vertex without intravenous contrast. RADIATION DOSE REDUCTION: This exam was performed according to the departmental dose-optimization program which includes automated exposure control, adjustment of the mA and/or kV according to patient size and/or use of iterative reconstruction technique. COMPARISON:  None  Available. FINDINGS: Brain: There is no mass, hemorrhage or extra-axial collection. The size and configuration of the ventricles and extra-axial CSF spaces are normal. There is hypoattenuation of the white matter, most commonly indicating chronic small vessel disease. Vascular: Atherosclerotic calcification of the vertebral and internal carotid arteries at the skull base. No abnormal hyperdensity of the major intracranial arteries or dural venous sinuses. Skull: The visualized skull base, calvarium and extracranial soft tissues are normal. Sinuses/Orbits: No fluid levels or advanced mucosal thickening of the visualized paranasal sinuses. No mastoid or middle ear effusion. The orbits are normal. IMPRESSION: 1. No acute intracranial abnormality. 2. Chronic small vessel disease. Electronically Signed   By: Ulyses Jarred M.D.   On: 08/01/2022 22:29               LOS: 16 days   Reilynn Lauro  [facility]   Pager on [email or web address]. If 7PM-7AM, please contact night-coverage at [email or web address]     08/02/2022, 2:20 PM

## 2022-08-02 NOTE — Progress Notes (Signed)
[facility] Cardiology  SUBJECTIVE: Patient laying in bed, denies chest pain or palpitations, but does report shortness of breath   Vitals:   08/02/22 0504 08/02/22 0843 08/02/22 0846 08/02/22 0904  BP: 104/78 (!) 72/49  126/79  Pulse:  86    Resp:  (!) 26    Temp:  97.8 F (36.6 C)    TempSrc:  Oral    SpO2:  94% 94%   Weight:      Height:         Intake/Output Summary (Last 24 hours) at 08/02/2022 9562 Last data filed at 08/02/2022 0441 Gross per 24 hour  Intake 873.43 ml  Output --  Net 873.43 ml      PHYSICAL EXAM  General: Well developed, well nourished, in no acute distress HEENT:  Normocephalic and atramatic Neck:  No JVD.  Lungs: Clear bilaterally to auscultation and percussion. Heart: HRRR . Normal S1 and S2 without gallops or murmurs.  Abdomen: Bowel sounds are positive, abdomen soft and non-tender  Msk:  Back normal, normal gait. Normal strength and tone for age. Extremities: No clubbing, cyanosis or edema.   Neuro: Alert and oriented X 3. Psych:  Good affect, responds appropriately   LABS: Basic Metabolic Panel: Recent Labs    07/31/22 0533 08/01/22 1322  NA 140 140  K 4.2 4.8  CL 95* 93*  CO2 34* 30  GLUCOSE 126* 116*  BUN 32* 45*  CREATININE 0.99 1.63*  CALCIUM 8.7* 8.8*  MG 1.9 2.7*   Liver Function Tests: No results for input(s): "AST", "ALT", "ALKPHOS", "BILITOT", "PROT", "ALBUMIN" in the last 72 hours. No results for input(s): "LIPASE", "AMYLASE" in the last 72 hours. CBC: Recent Labs    07/31/22 0533 08/01/22 1322  WBC 14.3* 13.9*  NEUTROABS  --  10.6*  HGB 12.0 12.6  HCT 42.8 43.9  MCV 91.1 90.0  PLT 363 458*   Cardiac Enzymes: No results for input(s): "CKTOTAL", "CKMB", "CKMBINDEX", "TROPONINI" in the last 72 hours. BNP: Invalid input(s): "POCBNP" D-Dimer: No results for input(s): "DDIMER" in the last 72 hours. Hemoglobin A1C: No results for input(s): "HGBA1C" in the last 72 hours. Fasting Lipid Panel: No results for  input(s): "CHOL", "HDL", "LDLCALC", "TRIG", "CHOLHDL", "LDLDIRECT" in the last 72 hours. Thyroid Function Tests: No results for input(s): "TSH", "T4TOTAL", "T3FREE", "THYROIDAB" in the last 72 hours.  Invalid input(s): "FREET3" Anemia Panel: No results for input(s): "VITAMINB12", "FOLATE", "FERRITIN", "TIBC", "IRON", "RETICCTPCT" in the last 72 hours.  CT HEAD WO CONTRAST (5MM)  Result Date: 08/01/2022 CLINICAL DATA:  Altered mental status EXAM: CT HEAD WITHOUT CONTRAST TECHNIQUE: Contiguous axial images were obtained from the base of the skull through the vertex without intravenous contrast. RADIATION DOSE REDUCTION: This exam was performed according to the departmental dose-optimization program which includes automated exposure control, adjustment of the mA and/or kV according to patient size and/or use of iterative reconstruction technique. COMPARISON:  None Available. FINDINGS: Brain: There is no mass, hemorrhage or extra-axial collection. The size and configuration of the ventricles and extra-axial CSF spaces are normal. There is hypoattenuation of the white matter, most commonly indicating chronic small vessel disease. Vascular: Atherosclerotic calcification of the vertebral and internal carotid arteries at the skull base. No abnormal hyperdensity of the major intracranial arteries or dural venous sinuses. Skull: The visualized skull base, calvarium and extracranial soft tissues are normal. Sinuses/Orbits: No fluid levels or advanced mucosal thickening of the visualized paranasal sinuses. No mastoid or middle ear effusion. The orbits are normal. IMPRESSION:  1. No acute intracranial abnormality. 2. Chronic small vessel disease. Electronically Signed   By: Ulyses Jarred M.D.   On: 08/01/2022 22:29     Echo EF 25-30% 03/06/2022  TELEMETRY: Sinus rhythm 85 bpm:  ASSESSMENT AND PLAN:  Principal Problem:   Paroxysmal atrial fibrillation with RVR (HCC) Active Problems:   Type 2 diabetes mellitus  with diabetic neuropathy, without long-term current use of insulin (HCC)   Hypothyroidism   Acute metabolic encephalopathy   HLD (hyperlipidemia)   Elevated troponin   OSA (obstructive sleep apnea)   Acute respiratory failure with hypoxia (HCC)   Acute on chronic systolic CHF (congestive heart failure) (HCC)   Acute on chronic respiratory failure with hypoxia and hypercapnia (HCC)   AKI (acute kidney injury) ([place])   Thoracic aortic aneurysm (HCC)   Hyperkalemia   Hypotension   Leukocytosis   Normocytic anemia   Acute pulmonary edema (HCC)   HAP (hospital-acquired pneumonia)   Goals of care, counseling/discussion   Delirium    1.  Atrial flutter with RVR, on Eliquis for stroke prevention, and diltiazem 60 mg every 6, metoprolol tartrate 50 mg 3 times daily, and amiodarone 200 mg twice daily, currently in sinus rhythm at 85 bpm 2.  Acute on chronic systolic congestive heart failure, HFrEF, EF 25-30%/20 11/2021, on torsemide 20 mg daily and good medical management as tolerated (metoprolol to tartrate, isosorbide mononitrate) 3.  COPD 4.  Chronic kidney disease stage IIIa   Recommendations   1.  Agree with current therapy 2.  Continue torsemide 20 mg daily 3.  Continue Eliquis for stroke prevention 4.  Continue amiodarone 200 mg twice daily 5.  Continue metoprolol tartrate, decreased to 50 mg twice daily 6.  Transition diltiazem 60 mg p.o. every 6h to Cardizem CD 180 mg daily 7.  Follow-up with Dr. Clayborn Bigness after discharge   Isaias Cowman, MD, PhD, [facility] 08/02/2022 9:07 AM

## 2022-08-03 ENCOUNTER — Inpatient Hospital Stay: Payer: Medicare Other

## 2022-08-03 DIAGNOSIS — J90 Pleural effusion, not elsewhere classified: Secondary | ICD-10-CM | POA: Insufficient documentation

## 2022-08-03 DIAGNOSIS — N179 Acute kidney failure, unspecified: Secondary | ICD-10-CM | POA: Diagnosis not present

## 2022-08-03 DIAGNOSIS — I5023 Acute on chronic systolic (congestive) heart failure: Secondary | ICD-10-CM | POA: Diagnosis not present

## 2022-08-03 DIAGNOSIS — I480 Paroxysmal atrial fibrillation: Secondary | ICD-10-CM | POA: Diagnosis not present

## 2022-08-03 LAB — GLUCOSE, CAPILLARY
Glucose-Capillary: 105 mg/dL — ABNORMAL HIGH (ref 70–99)
Glucose-Capillary: 109 mg/dL — ABNORMAL HIGH (ref 70–99)
Glucose-Capillary: 134 mg/dL — ABNORMAL HIGH (ref 70–99)
Glucose-Capillary: 86 mg/dL (ref 70–99)

## 2022-08-03 LAB — CBC WITH DIFFERENTIAL/PLATELET
Abs Immature Granulocytes: 0.05 10*3/uL (ref 0.00–0.07)
Basophils Absolute: 0.1 10*3/uL (ref 0.0–0.1)
Basophils Relative: 0 %
Eosinophils Absolute: 0.1 10*3/uL (ref 0.0–0.5)
Eosinophils Relative: 0 %
HCT: 37.8 % (ref 36.0–46.0)
Hemoglobin: 10.7 g/dL — ABNORMAL LOW (ref 12.0–15.0)
Immature Granulocytes: 0 %
Lymphocytes Relative: 10 %
Lymphs Abs: 1.2 10*3/uL (ref 0.7–4.0)
MCH: 24.9 pg — ABNORMAL LOW (ref 26.0–34.0)
MCHC: 28.3 g/dL — ABNORMAL LOW (ref 30.0–36.0)
MCV: 88.1 fL (ref 80.0–100.0)
Monocytes Absolute: 0.8 10*3/uL (ref 0.1–1.0)
Monocytes Relative: 6 %
Neutro Abs: 10.5 10*3/uL — ABNORMAL HIGH (ref 1.7–7.7)
Neutrophils Relative %: 84 %
Platelets: 412 10*3/uL — ABNORMAL HIGH (ref 150–400)
RBC: 4.29 MIL/uL (ref 3.87–5.11)
RDW: 23 % — ABNORMAL HIGH (ref 11.5–15.5)
WBC: 12.7 10*3/uL — ABNORMAL HIGH (ref 4.0–10.5)
nRBC: 0 % (ref 0.0–0.2)

## 2022-08-03 LAB — BASIC METABOLIC PANEL
Anion gap: 14 (ref 5–15)
BUN: 60 mg/dL — ABNORMAL HIGH (ref 8–23)
CO2: 32 mmol/L (ref 22–32)
Calcium: 8.1 mg/dL — ABNORMAL LOW (ref 8.9–10.3)
Chloride: 95 mmol/L — ABNORMAL LOW (ref 98–111)
Creatinine, Ser: 2.24 mg/dL — ABNORMAL HIGH (ref 0.44–1.00)
GFR, Estimated: 23 mL/min — ABNORMAL LOW (ref >60)
Glucose, Bld: 85 mg/dL (ref 70–99)
Potassium: 4.1 mmol/L (ref 3.5–5.1)
Sodium: 141 mmol/L (ref 135–145)

## 2022-08-03 NOTE — Progress Notes (Signed)
PT Cancellation Note  Patient Details Name: Madison Mcbride MRN: [id number] DOB: 03/06/1956   Cancelled Treatment:    Reason Eval/Treat Not Completed: Medical issues which prohibited therapy: Per nursing pt with respiratory distress and back on Bipap, requested no PT/OT this date.  Will attempt to see pt at a future date/time as medically appropriate.    Linus Salmons PT, DPT 08/03/22, 1:41 PM

## 2022-08-03 NOTE — Progress Notes (Signed)
[facility] Cardiology  Patient ID: Madison Mcbride MRN: [id number] DOB/AGE: 1956-04-13 67 y.o.   Admit date: 06/14/2022 Referring Physician Dr. Doristine Mango Primary Physician Dr. Emily Filbert Primary Cardiologist prev Dr. Nehemiah Massed Reason for Consultation tachycardia   HPI: Madison Mcbride. 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 She initially met sepsis criteria, started on IV meropenem for treatment of cystitis due to history of ESBL and IV diltiazem for AF RVR.  Discharge back to SNF was planned to the afternoon of 07/15/2021, but the patient was tachycardic, and the SNF refused to accept her.  Cardiology was consulted for further assistance with her atrial fibrillation.  She has completed treatment for HAP, hospital course complicated by acute encephalopathy felt to be related to delirium and AKI for which nephrology is following  Interval history: -Seen on BiPAP, still feels short of breath with minimal movement. -No chest pain or heart racing, main complaint is abdominal pain and wanting to have a bowel movement on a bedside commode and not the bedpan -Remains in NSR vs atrial flutter with controlled rate at 103-106 bpm   Vitals:   08/02/22 2010 08/03/22 0518 08/03/22 0758 08/03/22 0830  BP:  107/72  (!) 137/92  Pulse:  (!) 105  (!) 103  Resp:  20  16  Temp:  98.1 F (36.7 C)  97.8 F (36.6 C)  TempSrc:  Oral  Oral  SpO2: 94% 95% 95% 96%  Weight:      Height:         Intake/Output Summary (Last 24 hours) at 08/03/2022 1023 Last data filed at 08/03/2022 0342 Gross per 24 hour  Intake 845.56 ml  Output --  Net 845.56 ml       PHYSICAL EXAM General: caucasian  female, well nourished, in no acute distress.  Sitting upright in hospital bed on BiPAP. HEENT:  Normocephalic and atraumatic. Neck:   No JVD.  Lungs: On BiPAP, decreased bibasilar breath sounds without appreciable crackles.   Heart: Tachycardic i but regular . Normal S1 and S2 without gallops or murmurs.  Abdomen: Non-distended appearing with excess adiposity.  Msk: Normal strength and tone for age. Extremities: Chronic appearing venous stasis hyperpigmentation without peripheral edema.  Cap refill brisk bilaterally Neuro: Alert and oriented X 3. Psych:  Answers questions appropriately.    LABS: Basic Metabolic Panel: Recent Labs    08/01/22 1322 08/02/22 0901 08/03/22 0310  NA 140 143 141  K 4.8 5.5* 4.1  CL 93* 97* 95*  CO2 30 25 32  GLUCOSE 116* 29* 85  BUN 45* 54* 60*  CREATININE 1.63* 2.16* 2.24*  CALCIUM 8.8* 9.0 8.1*  MG 2.7*  --   --     Liver Function Tests: No results for input(s): "AST", "ALT", "ALKPHOS", "BILITOT", "PROT", "ALBUMIN" in the last 72 hours. No results for input(s): "LIPASE", "AMYLASE" in the last 72 hours. CBC: Recent Labs    08/02/22 0901 08/03/22 0310  WBC 11.0* 12.7*  NEUTROABS 8.8* 10.5*  HGB 13.2 10.7*  HCT 47.8* 37.8  MCV 92.8 88.1  PLT 444* 412*    Cardiac Enzymes: No results for input(s): "CKTOTAL", "CKMB", "CKMBINDEX", "TROPONINI" in the last 72 hours. BNP: Invalid  input(s): "POCBNP" D-Dimer: No results for input(s): "DDIMER" in the last 72 hours. Hemoglobin A1C: No results for input(s): "HGBA1C" in the last 72 hours. Fasting Lipid Panel: No results for input(s): "CHOL", "HDL", "LDLCALC", "TRIG", "CHOLHDL", "LDLDIRECT" in the last 72 hours. Thyroid Function Tests: No results for input(s): "TSH", "T4TOTAL", "T3FREE", "THYROIDAB" in the last 72 hours.  Invalid input(s): "FREET3" Anemia Panel: No results for input(s): "VITAMINB12", "FOLATE", "FERRITIN", "TIBC", "IRON", "RETICCTPCT" in the last 72 hours.  CT HEAD WO  CONTRAST (5MM)  Result Date: 08/01/2022 CLINICAL DATA:  Altered mental status EXAM: CT HEAD WITHOUT CONTRAST TECHNIQUE: Contiguous axial images were obtained from the base of the skull through the vertex without intravenous contrast. RADIATION DOSE REDUCTION: This exam was performed according to the departmental dose-optimization program which includes automated exposure control, adjustment of the mA and/or kV according to patient size and/or use of iterative reconstruction technique. COMPARISON:  None Available. FINDINGS: Brain: There is no mass, hemorrhage or extra-axial collection. The size and configuration of the ventricles and extra-axial CSF spaces are normal. There is hypoattenuation of the white matter, most commonly indicating chronic small vessel disease. Vascular: Atherosclerotic calcification of the vertebral and internal carotid arteries at the skull base. No abnormal hyperdensity of the major intracranial arteries or dural venous sinuses. Skull: The visualized skull base, calvarium and extracranial soft tissues are normal. Sinuses/Orbits: No fluid levels or advanced mucosal thickening of the visualized paranasal sinuses. No mastoid or middle ear effusion. The orbits are normal. IMPRESSION: 1. No acute intracranial abnormality. 2. Chronic small vessel disease. Electronically Signed   By: Ulyses Jarred M.D.   On: 08/01/2022 22:29     Echo EF 25-30% 03/06/2022  TELEMETRY: Sinus rhythm rate 103-106  ASSESSMENT AND PLAN:  Principal Problem:   Paroxysmal atrial fibrillation with RVR (HCC) Active Problems:   Type 2 diabetes mellitus with diabetic neuropathy, without long-term current use of insulin (HCC)   Hypothyroidism   Acute metabolic encephalopathy   HLD (hyperlipidemia)   Elevated troponin   OSA (obstructive sleep apnea)   Acute respiratory failure with hypoxia (HCC)   Acute on chronic systolic CHF (congestive heart failure) (HCC)   Acute on chronic respiratory failure with hypoxia  and hypercapnia (HCC)   AKI (acute kidney injury) (HCC)   Thoracic aortic aneurysm (HCC)   Hyperkalemia   Hypotension   Leukocytosis   Normocytic anemia   Acute pulmonary edema (HCC)   HAP (hospital-acquired pneumonia)   Goals of care, counseling/discussion   Delirium    1.  Atrial flutter with RVR, on Eliquis for stroke prevention, cardizem CD 180 daily, metoprolol tartrate 50 mg 3 times daily, and amiodarone 200 mg twice daily, currently in sinus rhythm at 103-106 2.  Acute on chronic systolic congestive heart failure, HFrEF, EF 25-30%/20 11/2021, on torsemide 20 mg daily and good medical management as tolerated (metoprolol to tartrate, isosorbide mononitrate) 3.  COPD 4.  Chronic kidney disease stage IIIa   Recommendations   1.  Agree with current therapy 2.  holding torsemide 20 mg daily per nephrology, now on continuous dextrose infusion at 50m/hr 3.  Continue Eliquis for stroke prevention 4.  Continue amiodarone 200 mg twice daily 5.  Continue metoprolol tartrate, decreased to 50 mg twice daily 6.  Continue Cardizem CD 180 mg daily for now during acute illness, would recommend discontinuing after hospital discharge follow up with her reduced EF 7.  Follow-up with Dr. CClayborn Bignessafter discharge  This patient's plan of care was discussed and created  with Dr. Saralyn Pilar and he is in agreement.    Tristan Schroeder, PA-C 08/03/2022 10:23 AM

## 2022-08-03 NOTE — Progress Notes (Signed)
OT Cancellation Note  Patient Details Name: Madison Mcbride MRN: [id number] DOB: December 30, 1955   Cancelled Treatment:    Reason Eval/Treat Not Completed: Medical issues which prohibited therapy  Per PT and nursing, patient with respiratory distress and back on Bipap, requested no PT/OT this date.  Will attempt to see pt at a future date/time as medically appropriate.   Jeneen Montgomery, OTR/L 08/03/22, 1:51 PM

## 2022-08-03 NOTE — TOC Progression Note (Signed)
Transition of Care [facility]) - Progression Note    Patient Details  Name: Julizza Sassone MRN: [id number] Date of Birth: 1956/05/23  Transition of Care [facility]) CM/SW [place], [place] Phone Number: 08/03/2022, 9:56 AM  Clinical Narrative:     TOC continues to follow for patient's medical readiness to return to [facility]   Expected Discharge Plan: [place] Barriers to Discharge: Continued Medical Work up  Expected Discharge Plan and Services   Discharge Planning Services: CM Consult Post Acute Care Choice: Resumption of Svcs/PTA Provider Living arrangements for the past 2 months: [place]                 DME Arranged: N/A DME Agency: NA       HH Arranged: NA [place] Agency: NA         Social Determinants of Health (SDOH) Interventions SDOH Screenings   Food Insecurity: No Food Insecurity (07/18/2022)  Housing: Low Risk  (07/18/2022)  Transportation Needs: No Transportation Needs (07/18/2022)  Utilities: Not At Risk (07/18/2022)  Tobacco Use: Low Risk  (08/01/2022)    Readmission Risk Interventions    07/10/2022   12:43 PM 03/07/2022    4:12 PM  Readmission Risk Prevention Plan  Transportation Screening Complete Complete  PCP or Specialist Appt within 5-7 Days  Complete  Home Care Screening  Complete  Medication Review (RN CM)  Complete  Medication Review (RN Care Manager) Complete   PCP or Specialist appointment within 3-5 days of discharge Complete   SW Recovery Care/Counseling Consult Complete   Skilled Nursing Facility Complete

## 2022-08-03 NOTE — Progress Notes (Addendum)
Progress Note    Madison Mcbride  ESP:[id number] DOB: [birth year]  DOA: 08/09/2022 PCP: Rusty Aus, MD      Brief Narrative:    Medical records reviewed and are as summarized below:  Madison Mcbride is a 67 y.o. female with medical history significant for morbid obesity, chronic diastolic CHF, hypothyroidism, type II DM, obstructive sleep apnea, lymphedema, CKD stage IIIa, OSA, chronic hypoxic and hypercapnic respiratory failure on 2 L/min oxygen and trilogy machine at night.       Assessment/Plan:   Principal Problem:   Paroxysmal atrial fibrillation with RVR (HCC) Active Problems:   Acute on chronic respiratory failure with hypoxia and hypercapnia (HCC)   Acute metabolic encephalopathy   Acute on chronic systolic CHF (congestive heart failure) (HCC)   HLD (hyperlipidemia)   OSA (obstructive sleep apnea)   Type 2 diabetes mellitus with diabetic neuropathy, without long-term current use of insulin (HCC)   Elevated troponin   Hypothyroidism   Acute respiratory failure with hypoxia (HCC)   AKI (acute kidney injury) ([place])   Thoracic aortic aneurysm (HCC)   Hyperkalemia   Hypotension   Leukocytosis   Normocytic anemia   Acute pulmonary edema (HCC)   HAP (hospital-acquired pneumonia)   Goals of care, counseling/discussion   Delirium   Bilateral pleural effusion   Body mass index is 48.98 kg/m.  (Morbid obesity)   Acute on chronic systolic CHF, pulmonary hypertension: Torsemide on hold.  2D echo showed EF estimated at 25 to 30%, RVSP 50.7 mmHg, BNP 2,302   AKI on CKD stage IIIa, hyperkalemia: Worsening AKI.  Potassium has normalized but creatinine is slightly trending upward.  Continue IV fluids for hydration and poor oral intake.   Bladder scan showed 443 mL of urine. Place Foley catheter to monitor urine output because of oliguria and acute urinary retention.   Follow-up with nephrologist.   Acute on chronic hypoxic and hypercapnic  respiratory failure, COPD, OHS, OSA: Oxygen requirement has gone up.  She was requiring up to 6 L/min oxygen via [place].  She is also on BiPAP intermittently because of difficulty breathing.  Continue BiPAP at night. She uses 2 L/min oxygen at baseline.     Bilateral pleural effusion, R>L: Ordered right-sided thoracentesis   Atrial fibrillation with RVR: Continue amiodarone, Cardizem and metoprolol.     Hypotension: Continue midodrine   Acute toxic metabolic encephalopathy, delirium: CT head obtained on 08/01/2022 did not show any acute abnormality.  No episodes of agitation reported overnight.  Completed 3-day course of Haldol on 08/02/2022  Recent hypoglycemia, hypokalemia: Resolved   Pneumonia: Completed antibiotics on 07/25/2022.  Other comorbidities include type II DM, restless leg syndrome, hypothyroidism, chronic venous stasis, anxiety, depression, chronic pain      Diet Order             Diet 2 gram sodium Room service appropriate? No; Fluid consistency: Thin  Diet effective now                            Consultants: Nephrologist Cardiologist  Procedures: None    Medications:    amiodarone  200 mg Oral BID   apixaban  5 mg Oral BID   atorvastatin  20 mg Oral Daily   budesonide (PULMICORT) nebulizer solution  0.25 mg Nebulization BID   buPROPion  150 mg Oral Daily   diltiazem  180 mg Oral Daily   feeding supplement  237 mL Oral  BID BM   hydrocerin   Topical BID   ipratropium-albuterol  3 mL Nebulization BID   isosorbide mononitrate  15 mg Oral BID   levothyroxine  250 mcg Oral Q0600   metoprolol tartrate  50 mg Oral BID   midodrine  10 mg Oral TID WC   pantoprazole  40 mg Oral Daily   rOPINIRole  1 mg Oral QHS   Continuous Infusions:  dextrose 5 % and 0.45% NaCl 50 mL/hr at 08/03/22 0037       Anti-infectives (From admission, onward)    Start     Dose/Rate Route Frequency Ordered Stop   07/25/22 1200  amoxicillin-clavulanate  (AUGMENTIN) 500-125 MG per tablet 1 tablet        1 tablet Oral 2 times daily 07/25/22 1102 07/26/22 2117   07/24/22 1400  Ampicillin-Sulbactam (UNASYN) 3 g in sodium chloride 0.9 % 100 mL IVPB  Status:  Discontinued        3 g 200 mL/hr over 30 Minutes Intravenous Every 6 hours 07/24/22 1228 07/24/22 1231   07/24/22 1330  amoxicillin-clavulanate (AUGMENTIN) 875-125 MG per tablet 1 tablet  Status:  Discontinued        1 tablet Oral Every 12 hours 07/24/22 1231 07/25/22 1102   07/21/22 1500  Ampicillin-Sulbactam (UNASYN) 3 g in sodium chloride 0.9 % 100 mL IVPB        3 g 200 mL/hr over 30 Minutes Intravenous Every 6 hours 07/21/22 1357 07/22/22 2216   07/18/22 1900  azithromycin (ZITHROMAX) 500 mg in sodium chloride 0.9 % 250 mL IVPB  Status:  Discontinued        500 mg 250 mL/hr over 60 Minutes Intravenous Every 24 hours 07/18/22 0205 07/19/22 1204   07/18/22 1000  piperacillin-tazobactam (ZOSYN) IVPB 3.375 g  Status:  Discontinued        3.375 g 12.5 mL/hr over 240 Minutes Intravenous Every 8 hours 07/18/22 0842 07/21/22 1357   07/18/22 0800  cefTRIAXone (ROCEPHIN) 1 g in sodium chloride 0.9 % 100 mL IVPB  Status:  Discontinued        1 g 200 mL/hr over 30 Minutes Intravenous Every 24 hours 07/18/22 0205 07/18/22 0829   08/06/2022 1915  vancomycin (VANCOREADY) IVPB 2000 mg/400 mL        2,000 mg 200 mL/hr over 120 Minutes Intravenous  Once 08/10/2022 1914 08/11/2022 2250   07/15/2022 1900  ceFEPIme (MAXIPIME) 2 g in sodium chloride 0.9 % 100 mL IVPB        2 g 200 mL/hr over 30 Minutes Intravenous  Once 07/24/2022 1853 07/18/22 1900   07/15/2022 1900  azithromycin (ZITHROMAX) 500 mg in sodium chloride 0.9 % 250 mL IVPB        500 mg 250 mL/hr over 60 Minutes Intravenous  Once 07/16/2022 1853 07/18/22 1900              Family Communication/Anticipated D/C date and plan/Code Status   DVT prophylaxis:  apixaban (ELIQUIS) tablet 5 mg     Code Status: Full Code  Family Communication:  Husband over the phone Disposition Plan: Plan to discharge to nursing home when medically stable   Status is: Inpatient Remains inpatient appropriate because: Worsening kidney function      Subjective:   Interval events noted.  She complains of shortness of breath.  No chest pain.  She has decreased urine output.  Objective:    Vitals:   08/03/22 1200 08/03/22 1218 08/03/22 1412 08/03/22 1525  BP: 125/87  115/83  Pulse: (!) 104  (!) 108 (!) 107  Resp: '16  20 16  '$ Temp: 98.2 F (36.8 C)   98 F (36.7 C)  TempSrc:    Oral  SpO2: 91% 97% 98% 99%  Weight:      Height:       No data found.   Intake/Output Summary (Last 24 hours) at 08/03/2022 1558 Last data filed at 08/03/2022 0342 Gross per 24 hour  Intake 845.56 ml  Output --  Net 845.56 ml   Filed Weights   07/23/22 0500 07/28/22 0904 08/01/22 0500  Weight: 135.8 kg (!) 137.5 kg 133.5 kg    Exam:  GEN: NAD SKIN: Warm and dry EYES: EOMI ENT: MMM CV: Irregular rate and rhythm, tachycardic PULM: Diminished air entry bilaterally, no wheezing,  ABD: soft, obese, NT, +BS CNS: AAO x person, place and time.  She knows the time on the clock and she knows today is Monday. Non focal EXT: No edema or tenderness    Data Reviewed:   I have personally reviewed following labs and imaging studies:  Labs: Labs show the following:   Basic Metabolic Panel: Recent Labs  Lab 07/28/22 0547 07/29/22 0626 07/30/22 0700 07/31/22 0533 08/01/22 1322 08/02/22 0901 08/03/22 0310  NA 137 137 136 140 140 143 141  K 3.2* 3.1* 3.0* 4.2 4.8 5.5* 4.1  CL 93* 92* 89* 95* 93* 97* 95*  CO2 30 35* 38* 34* 30 25 32  GLUCOSE 77 91 131* 126* 116* 29* 85  BUN 62* 51* 40* 32* 45* 54* 60*  CREATININE 1.60* 1.23* 1.00 0.99 1.63* 2.16* 2.24*  CALCIUM 8.4* 8.4* 8.2* 8.7* 8.8* 9.0 8.1*  MG 2.3 2.1 1.8 1.9 2.7*  --   --    GFR Estimated Creatinine Clearance: 33.2 mL/min (A) (by C-G formula based on SCr of 2.24 mg/dL (H)). Liver  Function Tests: No results for input(s): "AST", "ALT", "ALKPHOS", "BILITOT", "PROT", "ALBUMIN" in the last 168 hours.  No results for input(s): "LIPASE", "AMYLASE" in the last 168 hours. No results for input(s): "AMMONIA" in the last 168 hours. Coagulation profile No results for input(s): "INR", "PROTIME" in the last 168 hours.  CBC: Recent Labs  Lab 07/30/22 0700 07/31/22 0533 08/01/22 1322 08/02/22 0901 08/03/22 0310  WBC 9.7 14.3* 13.9* 11.0* 12.7*  NEUTROABS  --   --  10.6* 8.8* 10.5*  HGB 10.8* 12.0 12.6 13.2 10.7*  HCT 37.6 42.8 43.9 47.8* 37.8  MCV 89.1 91.1 90.0 92.8 88.1  PLT 333 363 458* 444* 412*   Cardiac Enzymes: No results for input(s): "CKTOTAL", "CKMB", "CKMBINDEX", "TROPONINI" in the last 168 hours. BNP (last 3 results) No results for input(s): "PROBNP" in the last 8760 hours. CBG: Recent Labs  Lab 08/02/22 1646 08/03/22 0006 08/03/22 0827 08/03/22 1156 08/03/22 1522  GLUCAP 93 86 105* 134* 109*   D-Dimer: No results for input(s): "DDIMER" in the last 72 hours. Hgb A1c: No results for input(s): "HGBA1C" in the last 72 hours. Lipid Profile: No results for input(s): "CHOL", "HDL", "LDLCALC", "TRIG", "CHOLHDL", "LDLDIRECT" in the last 72 hours. Thyroid function studies: No results for input(s): "TSH", "T4TOTAL", "T3FREE", "THYROIDAB" in the last 72 hours.  Invalid input(s): "FREET3" Anemia work up: No results for input(s): "VITAMINB12", "FOLATE", "FERRITIN", "TIBC", "IRON", "RETICCTPCT" in the last 72 hours. Sepsis Labs: Recent Labs  Lab 07/31/22 0533 08/01/22 1322 08/02/22 0901 08/03/22 0310  WBC 14.3* 13.9* 11.0* 12.7*    Microbiology No results found for this or any  previous visit (from the past 240 hour(s)).   Procedures and diagnostic studies:  DG Chest Port 1 View  Result Date: 08/03/2022 CLINICAL DATA:  Dyspnea EXAM: PORTABLE CHEST 1 VIEW COMPARISON:  Radiograph 07/27/2022 FINDINGS: Unchanged enlarged cardiac silhouette and  pulmonary vascular congestion. There are bilateral pleural effusions and lower lung airspace opacities, similar to prior exam. No evidence of pneumothorax. Bones are unchanged. IMPRESSION: Unchanged cardiomegaly and pulmonary vascular congestion with bilateral pleural effusions and lower lung airspace disease, not significantly changed from prior exam Electronically Signed   By: Maurine Simmering M.D.   On: 08/03/2022 12:50   CT HEAD WO CONTRAST (5MM)  Result Date: 08/01/2022 CLINICAL DATA:  Altered mental status EXAM: CT HEAD WITHOUT CONTRAST TECHNIQUE: Contiguous axial images were obtained from the base of the skull through the vertex without intravenous contrast. RADIATION DOSE REDUCTION: This exam was performed according to the departmental dose-optimization program which includes automated exposure control, adjustment of the mA and/or kV according to patient size and/or use of iterative reconstruction technique. COMPARISON:  None Available. FINDINGS: Brain: There is no mass, hemorrhage or extra-axial collection. The size and configuration of the ventricles and extra-axial CSF spaces are normal. There is hypoattenuation of the white matter, most commonly indicating chronic small vessel disease. Vascular: Atherosclerotic calcification of the vertebral and internal carotid arteries at the skull base. No abnormal hyperdensity of the major intracranial arteries or dural venous sinuses. Skull: The visualized skull base, calvarium and extracranial soft tissues are normal. Sinuses/Orbits: No fluid levels or advanced mucosal thickening of the visualized paranasal sinuses. No mastoid or middle ear effusion. The orbits are normal. IMPRESSION: 1. No acute intracranial abnormality. 2. Chronic small vessel disease. Electronically Signed   By: Ulyses Jarred M.D.   On: 08/01/2022 22:29               LOS: 17 days   Aariv Medlock  [facility]   Pager on [email or web address]. If 7PM-7AM, please contact  night-coverage at [email or web address]     08/03/2022, 3:58 PM

## 2022-08-03 NOTE — Progress Notes (Addendum)
[facility]  ROUNDING NOTE   Subjective:   [name] with a past medical history of chronic diastolic heart failure, morbid obesity, hypothyroidism, diabetes mellitus type 2, obstructive sleep apnea, lymphedema, and chronic kidney disease stage IIIa.  Patient presents to the emergency department with complaints of shortness of breath.  She has been admitted for Acute pulmonary edema (HCC) [J81.0] Atrial fibrillation with RVR (HCC) [I48.91] HCAP (healthcare-associated pneumonia) [J18.9] Acute on chronic respiratory failure with hypoxia and hypercapnia ([place]) [J47.82, J96.22]  Patient is known to our practice and is seen outpatient by Dr. Holley Raring.  Patient was last seen in our office on April 03, 2021.  Patient has been lost to follow-up since that time.    Patient remains on bipap Alert and oriented to self and place States she feels well today No family at bedside    Objective:  Vital signs in last 24 hours:  Temp:  [97.8 F (36.6 C)-98.1 F (36.7 C)] 97.8 F (36.6 C) (01/22 0830) Pulse Rate:  [103-106] 106 (01/22 1117) Resp:  [13-21] 16 (01/22 0830) BP: (101-137)/(71-92) 118/84 (01/22 1117) SpO2:  [92 %-100 %] 96 % (01/22 0830) FiO2 (%):  [40 %] 40 % (01/21 2010)  Weight change:  Filed Weights   07/23/22 0500 07/28/22 0904 08/01/22 0500  Weight: 135.8 kg (!) 137.5 kg 133.5 kg    Intake/Output: I/O last 3 completed shifts: In: 9562 [I.V.:1719] Out: -    Intake/Output this shift:  No intake/output data recorded.  Physical Exam: General: NAD  Head: Normocephalic, atraumatic. Moist oral mucosal membranes  Eyes: Anicteric  Lungs:  Wheezing, bipap in place  Heart: Regular rate and rhythm  Abdomen:  Soft, nontender, obese  Extremities: No peripheral edema.  Neurologic: Alert, able to move all four extremities  Skin: No lesions  Access: None    Basic Metabolic Panel: Recent Labs  Lab 07/28/22 0547 07/29/22 0626 07/30/22 0700  07/31/22 0533 08/01/22 1322 08/02/22 0901 08/03/22 0310  NA 137 137 136 140 140 143 141  K 3.2* 3.1* 3.0* 4.2 4.8 5.5* 4.1  CL 93* 92* 89* 95* 93* 97* 95*  CO2 30 35* 38* 34* 30 25 32  GLUCOSE 77 91 131* 126* 116* 29* 85  BUN 62* 51* 40* 32* 45* 54* 60*  CREATININE 1.60* 1.23* 1.00 0.99 1.63* 2.16* 2.24*  CALCIUM 8.4* 8.4* 8.2* 8.7* 8.8* 9.0 8.1*  MG 2.3 2.1 1.8 1.9 2.7*  --   --      Liver Function Tests: No results for input(s): "AST", "ALT", "ALKPHOS", "BILITOT", "PROT", "ALBUMIN" in the last 168 hours.  No results for input(s): "LIPASE", "AMYLASE" in the last 168 hours. No results for input(s): "AMMONIA" in the last 168 hours.  CBC: Recent Labs  Lab 07/30/22 0700 07/31/22 0533 08/01/22 1322 08/02/22 0901 08/03/22 0310  WBC 9.7 14.3* 13.9* 11.0* 12.7*  NEUTROABS  --   --  10.6* 8.8* 10.5*  HGB 10.8* 12.0 12.6 13.2 10.7*  HCT 37.6 42.8 43.9 47.8* 37.8  MCV 89.1 91.1 90.0 92.8 88.1  PLT 333 363 458* 444* 412*     Cardiac Enzymes: No results for input(s): "CKTOTAL", "CKMB", "CKMBINDEX", "TROPONINI" in the last 168 hours.  BNP: Invalid input(s): "POCBNP"  CBG: Recent Labs  Lab 08/02/22 0938 08/02/22 1032 08/02/22 1646 08/03/22 0006 08/03/22 0827  GLUCAP 56* 150* 93 86 105*     Microbiology: Results for orders placed or performed during the hospital encounter of 07/29/2022  Blood culture (routine x 2)  Status: None   Collection Time: 07/14/2022 11:01 PM   Specimen: BLOOD  Result Value Ref Range Status   Specimen Description BLOOD BLOOD RIGHT WRIST  Final   Special Requests   Final    BOTTLES DRAWN AEROBIC AND ANAEROBIC Blood Culture results may not be optimal due to an inadequate volume of blood received in culture bottles   Culture   Final    NO GROWTH 5 DAYS Performed at [facility], [address] [place], [place] [address]    Report Status 07/22/2022 FINAL  Final  Blood culture (routine x 2)     Status: None   Collection Time:  08/10/2022 11:01 PM   Specimen: BLOOD  Result Value Ref Range Status   Specimen Description BLOOD BLOOD LEFT HAND  Final   Special Requests IN PEDIATRIC BOTTLE Blood Culture adequate volume  Final   Culture   Final    NO GROWTH 5 DAYS Performed at [facility], [address], [place], [place] [address]    Report Status 07/22/2022 FINAL  Final  MRSA Next Gen by PCR, Nasal     Status: None   Collection Time: 07/18/22 10:11 AM   Specimen: Nasal Mucosa; Nasal Swab  Result Value Ref Range Status   MRSA by PCR Next Gen NOT DETECTED NOT DETECTED Final    Comment: (NOTE) The GeneXpert MRSA Assay (FDA approved for NASAL specimens only), is one component of a comprehensive MRSA colonization surveillance program. It is not intended to diagnose MRSA infection nor to guide or monitor treatment for MRSA infections. Test performance is not FDA approved in patients less than 67 years old. Performed at [facility], [place], [place] [address]   Respiratory (~20 pathogens) panel by PCR     Status: None   Collection Time: 07/18/22 11:07 AM   Specimen: Nasopharyngeal Swab; Respiratory  Result Value Ref Range Status   Adenovirus NOT DETECTED NOT DETECTED Final   Coronavirus 229E NOT DETECTED NOT DETECTED Final    Comment: (NOTE) The Coronavirus on the Respiratory Panel, DOES NOT test for the novel  Coronavirus (2019 nCoV)    Coronavirus HKU1 NOT DETECTED NOT DETECTED Final   Coronavirus NL63 NOT DETECTED NOT DETECTED Final   Coronavirus OC43 NOT DETECTED NOT DETECTED Final   Metapneumovirus NOT DETECTED NOT DETECTED Final   Rhinovirus / Enterovirus NOT DETECTED NOT DETECTED Final   Influenza A NOT DETECTED NOT DETECTED Final   Influenza B NOT DETECTED NOT DETECTED Final   Parainfluenza Virus 1 NOT DETECTED NOT DETECTED Final   Parainfluenza Virus 2 NOT DETECTED NOT DETECTED Final   Parainfluenza Virus 3 NOT DETECTED NOT DETECTED Final   Parainfluenza Virus 4  NOT DETECTED NOT DETECTED Final   Respiratory Syncytial Virus NOT DETECTED NOT DETECTED Final   Bordetella pertussis NOT DETECTED NOT DETECTED Final   Bordetella Parapertussis NOT DETECTED NOT DETECTED Final   Chlamydophila pneumoniae NOT DETECTED NOT DETECTED Final   Mycoplasma pneumoniae NOT DETECTED NOT DETECTED Final    Comment: Performed at [facility], [place]. [address] [place], [place] [address]  Urine Culture     Status: None   Collection Time: 07/23/22  6:00 AM   Specimen: Urine, Clean Catch  Result Value Ref Range Status   Specimen Description   Final    URINE, CLEAN CATCH Performed at [facility], [address], [place], [place] [address]    Special Requests   Final    Normal  Performed at [facility], [address], [place], [place] [address]    Culture   Final    NO GROWTH Performed at [facility], [place] [address], [place], [place] [address]    Report Status 07/24/2022 FINAL  Final    Coagulation Studies: No results for input(s): "LABPROT", "INR" in the last 72 hours.  Urinalysis: No results for input(s): "COLORURINE", "LABSPEC", "PHURINE", "GLUCOSEU", "HGBUR", "BILIRUBINUR", "KETONESUR", "PROTEINUR", "UROBILINOGEN", "NITRITE", "LEUKOCYTESUR" in the last 72 hours.  Invalid input(s): "APPERANCEUR"    Imaging: CT HEAD WO CONTRAST (5MM)  Result Date: 08/01/2022 CLINICAL DATA:  Altered mental status EXAM: CT HEAD WITHOUT CONTRAST TECHNIQUE: Contiguous axial images were obtained from the base of the skull through the vertex without intravenous contrast. RADIATION DOSE REDUCTION: This exam was performed according to the departmental dose-optimization program which includes automated exposure control, adjustment of the mA and/or kV according to patient size and/or use of iterative reconstruction technique. COMPARISON:  None Available. FINDINGS: Brain: There is no mass, hemorrhage or extra-axial collection. The size and configuration of the  ventricles and extra-axial CSF spaces are normal. There is hypoattenuation of the white matter, most commonly indicating chronic small vessel disease. Vascular: Atherosclerotic calcification of the vertebral and internal carotid arteries at the skull base. No abnormal hyperdensity of the major intracranial arteries or dural venous sinuses. Skull: The visualized skull base, calvarium and extracranial soft tissues are normal. Sinuses/Orbits: No fluid levels or advanced mucosal thickening of the visualized paranasal sinuses. No mastoid or middle ear effusion. The orbits are normal. IMPRESSION: 1. No acute intracranial abnormality. 2. Chronic small vessel disease. Electronically Signed   By: Ulyses Jarred M.D.   On: 08/01/2022 22:29     Medications:    dextrose 5 % and 0.45% NaCl 50 mL/hr at 08/03/22 3704    amiodarone  200 mg Oral BID   apixaban  5 mg Oral BID   atorvastatin  20 mg Oral Daily   budesonide (PULMICORT) nebulizer solution  0.25 mg Nebulization BID   buPROPion  150 mg Oral Daily   diltiazem  180 mg Oral Daily   feeding supplement  237 mL Oral BID BM   hydrocerin   Topical BID   ipratropium-albuterol  3 mL Nebulization BID   isosorbide mononitrate  15 mg Oral BID   levothyroxine  250 mcg Oral Q0600   metoprolol tartrate  50 mg Oral BID   midodrine  10 mg Oral TID WC   pantoprazole  40 mg Oral Daily   rOPINIRole  1 mg Oral QHS   acetaminophen, guaiFENesin, levalbuterol, liver oil-zinc oxide, metoprolol tartrate, ondansetron (ZOFRAN) IV, polyethylene glycol, senna-docusate  Assessment/ Plan:  Ms. Genetta Fiero is a 67 y.o.  female  past medical history of chronic diastolic heart failure, morbid obesity, hypothyroidism, diabetes mellitus type 2, obstructive sleep apnea, lymphedema, and chronic kidney disease stage IIIa.  Patient presents to the emergency department with complaints of shortness of breath.  She has been admitted for Acute pulmonary edema (HCC) [J81.0] Atrial  fibrillation with RVR (HCC) [I48.91] HCAP (healthcare-associated pneumonia) [J18.9] Acute on chronic respiratory failure with hypoxia and hypercapnia (HCC) [U88.91, J96.22]   Acute Kidney Injury on chronic kidney disease stage IIIa with baseline creatinine 1.35 and GFR of 54 on 07/16/22.  Acute kidney injury appears multifactorial due to hypotension and hemodynamic instability. Renal ultrasound negative for obstruction.    Creatinine continues to rise today. No urine output recorded.  Appears patient experienced hypotension over the weekend.  Continue IVF and patient encouraged to increase oral intake.   Lab Results  Component Value Date   CREATININE 2.24 (H) 08/03/2022   CREATININE 2.16 (H) 08/02/2022   CREATININE 1.63 (H) 08/01/2022    Intake/Output Summary (Last 24 hours) at 08/03/2022 1150 Last data filed at 08/03/2022 0342 Gross per 24 hour  Intake 845.56 ml  Output --  Net 845.56 ml    2.  Acute on chronic systolic heart failure.  Echo completed on 03/06/2022 shows EF 45 to 50% with a mild to moderate MVR, TVR, and AVR.  Cardiology following.   3. Anemia of chronic kidney disease  Normocytic Lab Results  Component Value Date   HGB 10.7 (L) 08/03/2022  Hemoglobin within optimal range.   4.  Hypertension with chronic kidney disease. Receiving diltiazem, hydralazine, isosorbide, and metoprolol.  Torsemide held. Receiving midodrine '10mg'$  three times daily.      LOS: [place] 1/22/202411:50 AM

## 2022-08-04 DIAGNOSIS — J9621 Acute and chronic respiratory failure with hypoxia: Secondary | ICD-10-CM | POA: Diagnosis not present

## 2022-08-04 DIAGNOSIS — N179 Acute kidney failure, unspecified: Secondary | ICD-10-CM | POA: Diagnosis not present

## 2022-08-04 DIAGNOSIS — J9622 Acute and chronic respiratory failure with hypercapnia: Secondary | ICD-10-CM | POA: Diagnosis not present

## 2022-08-04 DIAGNOSIS — Z7189 Other specified counseling: Secondary | ICD-10-CM | POA: Diagnosis not present

## 2022-08-04 DIAGNOSIS — I480 Paroxysmal atrial fibrillation: Secondary | ICD-10-CM | POA: Diagnosis not present

## 2022-08-04 LAB — MAGNESIUM: Magnesium: 2.5 mg/dL — ABNORMAL HIGH (ref 1.7–2.4)

## 2022-08-04 LAB — BASIC METABOLIC PANEL
Anion gap: 14 (ref 5–15)
BUN: 69 mg/dL — ABNORMAL HIGH (ref 8–23)
CO2: 32 mmol/L (ref 22–32)
Calcium: 8.3 mg/dL — ABNORMAL LOW (ref 8.9–10.3)
Chloride: 95 mmol/L — ABNORMAL LOW (ref 98–111)
Creatinine, Ser: 2.23 mg/dL — ABNORMAL HIGH (ref 0.44–1.00)
GFR, Estimated: 23 mL/min — ABNORMAL LOW (ref >60)
Glucose, Bld: 98 mg/dL (ref 70–99)
Potassium: 4.3 mmol/L (ref 3.5–5.1)
Sodium: 141 mmol/L (ref 135–145)

## 2022-08-04 LAB — CBC WITH DIFFERENTIAL/PLATELET
Abs Immature Granulocytes: 0.04 10*3/uL (ref 0.00–0.07)
Basophils Absolute: 0.1 10*3/uL (ref 0.0–0.1)
Basophils Relative: 1 %
Eosinophils Absolute: 0.1 10*3/uL (ref 0.0–0.5)
Eosinophils Relative: 1 %
HCT: 40.9 % (ref 36.0–46.0)
Hemoglobin: 11.7 g/dL — ABNORMAL LOW (ref 12.0–15.0)
Immature Granulocytes: 0 %
Lymphocytes Relative: 11 %
Lymphs Abs: 1.1 10*3/uL (ref 0.7–4.0)
MCH: 25.3 pg — ABNORMAL LOW (ref 26.0–34.0)
MCHC: 28.6 g/dL — ABNORMAL LOW (ref 30.0–36.0)
MCV: 88.5 fL (ref 80.0–100.0)
Monocytes Absolute: 0.9 10*3/uL (ref 0.1–1.0)
Monocytes Relative: 9 %
Neutro Abs: 7.8 10*3/uL — ABNORMAL HIGH (ref 1.7–7.7)
Neutrophils Relative %: 78 %
Platelets: 446 10*3/uL — ABNORMAL HIGH (ref 150–400)
RBC: 4.62 MIL/uL (ref 3.87–5.11)
RDW: 23.1 % — ABNORMAL HIGH (ref 11.5–15.5)
Smear Review: NORMAL
WBC: 9.9 10*3/uL (ref 4.0–10.5)
nRBC: 0 % (ref 0.0–0.2)

## 2022-08-04 LAB — GLUCOSE, CAPILLARY
Glucose-Capillary: 110 mg/dL — ABNORMAL HIGH (ref 70–99)
Glucose-Capillary: 141 mg/dL — ABNORMAL HIGH (ref 70–99)
Glucose-Capillary: 81 mg/dL (ref 70–99)
Glucose-Capillary: 97 mg/dL (ref 70–99)

## 2022-08-04 LAB — BLOOD GAS, ARTERIAL
Acid-Base Excess: 5.2 mmol/L — ABNORMAL HIGH (ref 0.0–2.0)
Bicarbonate: 32.6 mmol/L — ABNORMAL HIGH (ref 20.0–28.0)
O2 Saturation: 95.9 %
Patient temperature: 37
pCO2 arterial: 59 mmHg — ABNORMAL HIGH (ref 32–48)
pH, Arterial: 7.35 (ref 7.35–7.45)
pO2, Arterial: 83 mmHg (ref 83–108)

## 2022-08-04 LAB — PHOSPHORUS: Phosphorus: 4.5 mg/dL (ref 2.5–4.6)

## 2022-08-04 MED ORDER — CHLORHEXIDINE GLUCONATE CLOTH 2 % EX PADS
6.0000 | MEDICATED_PAD | Freq: Every day | CUTANEOUS | Status: DC
  Administered 2022-08-04 – 2022-08-12 (×7): 6 via TOPICAL

## 2022-08-04 NOTE — Progress Notes (Addendum)
Daily Progress Note   Patient Name: Madison Mcbride       Date: 08/04/2022 DOB: 14-Jul-1955  Age: 67 y.o. MRN#: [id number] Attending Physician: Jennye Boroughs, MD Primary Care Physician: Rusty Aus, MD Admit Date: 07/14/2022  Reason for Consultation/Follow-up: Establishing goals of care  Subjective: Notes and labs reviewed.  In to see patient.  Patient is sitting in bed.  She states she is short of breath; she also states she would like powder for her back as she feels moisture.  She states she is not in a place where she can talk at this time.  RN into bedside to place patient back on BiPAP.  Called to speak to her husband Taylor.  He states he has been unable to speak with his wife regarding her wishes on goals of care, as she has spent a great amount of time on BiPAP.  He states he received a phone call about thoracentesis, but wants his wife to make the decision herself.  Discussed the purpose of thoracentesis.  Discussed ways of communication when a person is on the BiPAP.  Discussed different scenarios.  Discussed that we would see how she does after thoracentesis and move forward from there.  Length of Stay: 18  Current Medications: Scheduled Meds:   amiodarone  200 mg Oral BID   apixaban  5 mg Oral BID   atorvastatin  20 mg Oral Daily   budesonide (PULMICORT) nebulizer solution  0.25 mg Nebulization BID   buPROPion  150 mg Oral Daily   diltiazem  180 mg Oral Daily   feeding supplement  237 mL Oral BID BM   hydrocerin   Topical BID   ipratropium-albuterol  3 mL Nebulization BID   isosorbide mononitrate  15 mg Oral BID   levothyroxine  250 mcg Oral Q0600   metoprolol tartrate  50 mg Oral BID   midodrine  10 mg Oral TID WC   pantoprazole  40 mg Oral Daily   rOPINIRole  1 mg  Oral QHS    Continuous Infusions:  dextrose 5 % and 0.45% NaCl 50 mL/hr at 08/04/22 0250    PRN Meds: acetaminophen, guaiFENesin, levalbuterol, liver oil-zinc oxide, metoprolol tartrate, ondansetron (ZOFRAN) IV, polyethylene glycol, senna-docusate  Physical Exam Pulmonary:     Comments: Some work of breathing noted. Neurological:  Mental Status: She is alert.             Vital Signs: BP 108/78 (BP Location: Left Arm)   Pulse (!) 106   Temp 97.8 F (36.6 C) (Axillary)   Resp 16   Ht '5\' 5"'$  (1.651 m)   Wt 133.5 kg   SpO2 98% Comment: rechecked  BMI 48.98 kg/m  SpO2: SpO2: 98 % (rechecked) O2 Device: O2 Device: Bi-PAP O2 Flow Rate: O2 Flow Rate (L/min): 6 L/min  Intake/output summary:  Intake/Output Summary (Last 24 hours) at 08/04/2022 1311 Last data filed at 08/04/2022 1143 Gross per 24 hour  Intake 240 ml  Output 315 ml  Net -75 ml   LBM: Last BM Date : 08/03/22 Baseline Weight: Weight: (!) 136.5 kg Most recent weight: Weight: 133.5 kg   Patient Active Problem List   Diagnosis Date Noted   Bilateral pleural effusion 08/03/2022   Delirium 07/31/2022   Goals of care, counseling/discussion 07/28/2022   Acute pulmonary edema ([place]) 07/20/2022   HAP (hospital-acquired pneumonia) 07/20/2022   Chronic atrial fibrillation with RVR ([place]) 08/03/2022   Thoracic aortic aneurysm ([place]) 08/09/2022   Hyperkalemia 07/29/2022   Hypotension 07/24/2022   Leukocytosis 07/18/2022   Normocytic anemia 07/27/2022   Paroxysmal atrial fibrillation with RVR ([place]) 07/16/2022   SIRS (systemic inflammatory response syndrome) ([place]) 07/15/2022   Tachycardia 07/15/2022   AKI (acute kidney injury) ([place]) 07/15/2022   UTI (urinary tract infection) 06/30/2022   HTN (hypertension) 06/13/2022   Type II diabetes mellitus with renal manifestations ([place]) 06/30/2022   Depression with anxiety 07/03/2022   Iron deficiency anemia 07/05/2022   Sepsis ([place]) 06/27/2022   Myocardial injury 06/27/2022    Atrial fibrillation with RVR ([place]) 06/22/2022   Acute kidney injury ([place]) 05/20/2022   Chronic hypoxic respiratory failure, on home oxygen therapy ([place]) 05/20/2022   Congestive heart failure (CHF) ([place]) 05/20/2022   Microcytic anemia 05/20/2022   Chronic venous stasis dermatitis of both lower extremities 05/20/2022   Pressure ulcer of buttock 05/20/2022   Inflammatory arthritis 05/20/2022   Sinus bradycardia 05/20/2022   Dysuria 05/20/2022   PAF (paroxysmal atrial fibrillation) ([place]) 03/08/2022   Acute on chronic respiratory failure with hypoxia and hypercapnia ([place]) 03/05/2022   Effusion of knee joint, left 03/22/2021   Acute on chronic systolic CHF (congestive heart failure) ([place]) 03/19/2021   Acute on chronic heart failure ([place]) 03/14/2021   Acute respiratory failure with hypoxia ([place]) 03/14/2021   Diabetes mellitus without complication ([employer]) 77/93/9030   HLD (hyperlipidemia) 04/09/2020   Depression 04/09/2020   Elevated troponin 04/09/2020   OSA (obstructive sleep apnea) 04/09/2020   Peripheral polyneuropathy 02/29/2020   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   Lumbar radiculopathy, acute    Acute midline low back pain without sciatica    Type 2 diabetes mellitus with diabetic neuropathy, without long-term current use of insulin ([place]) 02/21/2020   Hypothyroidism 02/21/2020   Morbid obesity with BMI of 50.0-59.9, adult ([place]) 02/21/2020   CAP (community acquired pneumonia) 02/21/2020   Bilateral cellulitis of lower leg 02/21/2020   Acute respiratory failure with hypoxia and hypercapnia ([place]) 02/21/2020   Acute renal failure superimposed on stage 3a chronic kidney disease ([place]) 04/04/3006   Acute metabolic encephalopathy 62/26/3335   Chronic kidney disease, stage 3a ([place]) 01/27/2020   Transaminitis 01/27/2020   Lymphedema of both lower extremities 08/21/2016   Benign essential hypertension 05/18/2016    Palliative Care Assessment &  Plan    Recommendations/Plan: Continue full code/full  scope at this time PMT will follow closely    Code Status:    Code Status Orders  (From admission, onward)           Start     Ordered   08/11/2022 2107  Full code  Continuous       Question:  By:  Answer:  Default: patient does not have capacity for decision making, no surrogate or prior directive available   07/18/2022 2107           Code Status History     Date Active Date Inactive Code Status Order ID Comments User Context   06/17/2022 0610 07/18/2022 1503 Full Code [id number]  Mansy, Arvella Merles, MD ED   05/20/2022 1733 05/29/2022 2115 Full Code [id number]  Jose Persia, MD ED   03/05/2022 2257 03/19/2022 1729 Full Code [id number]  Lang Snow, NP ED   07/15/2021 2331 07/29/2021 2207 Full Code [id number]  Cox, Amy N, DO ED   03/14/2021 2357 03/24/2021 2129 Full Code [id number]  Clarnce Flock, MD ED   04/09/2020 1838 04/19/2020 0100 Full Code [id number]  Ivor Costa, MD Inpatient   02/21/2020 0600 03/01/2020 2155 Full Code [id number]  Athena Masse, MD ED      Advance Directive Documentation    Flowsheet Row Most Recent Value  Type of Advance Directive Healthcare Power of Attorney  Pre-existing out of facility DNR order (yellow form or pink MOST form) --  "MOST" Form in Place? --       Prognosis: Poor overall     Thank you for allowing the Palliative Medicine Team to assist in the care of this patient.     Asencion Gowda, NP  Please contact Palliative Medicine Team phone at [phone] for questions and concerns.

## 2022-08-04 NOTE — Progress Notes (Signed)
[facility]  Patient ID: Madison Mcbride MRN: [id number] DOB/AGE: 03/13/1956 67 y.o.   Admit date: 07/04/2022 Referring Physician Dr. Doristine Mango Primary Physician Dr. Emily Filbert Primary Cardiologist prev Dr. Nehemiah Massed Reason for Consultation tachycardia   HPI: Madison Mcbride is a 67yoF with a PMH of  HFrEF (prev 45-50% 02/2022), paroxysmal AF on eliquis, OSA on CPAP, HLD, DM2, CKD 3, hypothyroidism, morbid obesity with OHS who presented to [facility] ED 06/22/2022 from a skilled nursing facility out of concern for tachycardia, heart rates on admission were in the 130s, Presented with leukocytosis to 12.6K, UA concerning for cystitis. She initially met sepsis criteria, started on IV meropenem for treatment of cystitis due to history of ESBL and IV diltiazem for AF RVR.  Discharge back to SNF was planned to the afternoon of 69/09/2021, but the patient was tachycardic, and the SNF refused to accept her.  Cardiology was consulted for further assistance with her atrial fibrillation.  She has completed treatment for HAP, hospital course complicated by acute encephalopathy felt to be related to delirium and AKI for which nephrology is following  Interval history: -no acute events -feels a little "winded." Debating proceeding with thoracentesis  -HR controlled in the high 90s-low 100s in what appears to be NSR to sinus tach    Vitals:   08/03/22 2000 08/03/22 2020 08/03/22 2240 08/04/22 0415  BP: (!) 143/109  103/73 107/68  Pulse: (!) 103  100 (!) 106  Resp: '19  16 19  '$ Temp: 97.8 F (36.6 C)  98.1 F (36.7 C) 97.7 F (36.5 C)  TempSrc: Axillary  Oral Oral  SpO2: 100% 95% 98% 98%  Weight:      Height:         Intake/Output Summary (Last 24 hours) at 08/04/2022 0932 Last data filed at 08/04/2022 6712 Gross per 24 hour  Intake --  Output 315 ml  Net -315 ml    PHYSICAL EXAM General: caucasian female, well nourished, in no acute distress.  Sitting upright in hospital bed on  BiPAP, sister at bedside HEENT:  Normocephalic and atraumatic. Neck:   No JVD.  Lungs: On BiPAP, decreased bibasilar breath sounds without appreciable crackles.   Heart: Tachycardic but regular . Normal S1 and S2 without gallops or murmurs.  Abdomen: Non-distended appearing with excess adiposity.  Msk: Normal strength and tone for age. Extremities: Chronic appearing venous stasis hyperpigmentation without peripheral edema.  Cap refill brisk bilaterally Neuro: Alert and oriented X 3. Psych:  Answers questions appropriately.    LABS: Basic Metabolic Panel: Recent Labs    08/01/22 1322 08/02/22 0901 08/03/22 0310  NA 140 143 141  K 4.8 5.5* 4.1  CL 93* 97* 95*  CO2 30 25 32  GLUCOSE 116* 29* 85  BUN 45* 54* 60*  CREATININE 1.63* 2.16* 2.24*  CALCIUM 8.8* 9.0 8.1*  MG 2.7*  --   --     Liver Function Tests: No results for input(s): "AST", "ALT", "ALKPHOS", "BILITOT", "PROT", "ALBUMIN" in the last 72 hours. No results for input(s): "LIPASE", "AMYLASE" in the last 72 hours. CBC: Recent Labs    08/02/22 0901 08/03/22 0310  WBC 11.0* 12.7*  NEUTROABS 8.8* 10.5*  HGB 13.2 10.7*  HCT 47.8* 37.8  MCV 92.8 88.1  PLT 444* 412*    Cardiac Enzymes: No results for input(s): "CKTOTAL", "CKMB", "CKMBINDEX", "TROPONINI" in the last 72 hours. BNP: Invalid input(s): "POCBNP" D-Dimer: No results for input(s): "DDIMER" in the last 72 hours. Hemoglobin A1C: No results  for input(s): "HGBA1C" in the last 72 hours. Fasting Lipid Panel: No results for input(s): "CHOL", "HDL", "LDLCALC", "TRIG", "CHOLHDL", "LDLDIRECT" in the last 72 hours. Thyroid Function Tests: No results for input(s): "TSH", "T4TOTAL", "T3FREE", "THYROIDAB" in the last 72 hours.  Invalid input(s): "FREET3" Anemia Panel: No results for input(s): "VITAMINB12", "FOLATE", "FERRITIN", "TIBC", "IRON", "RETICCTPCT" in the last 72 hours.  DG Chest Port 1 View  Result Date: 08/03/2022 CLINICAL DATA:  Dyspnea EXAM:  PORTABLE CHEST 1 VIEW COMPARISON:  Radiograph 07/27/2022 FINDINGS: Unchanged enlarged cardiac silhouette and pulmonary vascular congestion. There are bilateral pleural effusions and lower lung airspace opacities, similar to prior exam. No evidence of pneumothorax. Bones are unchanged. IMPRESSION: Unchanged cardiomegaly and pulmonary vascular congestion with bilateral pleural effusions and lower lung airspace disease, not significantly changed from prior exam Electronically Signed   By: Maurine Simmering M.D.   On: 08/03/2022 12:50     Echo EF 25-30% 03/06/2022  TELEMETRY: Sinus rhythm rate 103-106  ASSESSMENT AND PLAN:  Principal Problem:   Paroxysmal atrial fibrillation with RVR (HCC) Active Problems:   Type 2 diabetes mellitus with diabetic neuropathy, without long-term current use of insulin (HCC)   Hypothyroidism   Acute metabolic encephalopathy   HLD (hyperlipidemia)   Elevated troponin   OSA (obstructive sleep apnea)   Acute respiratory failure with hypoxia (HCC)   Acute on chronic systolic CHF (congestive heart failure) (HCC)   Acute on chronic respiratory failure with hypoxia and hypercapnia (HCC)   AKI (acute kidney injury) (HCC)   Thoracic aortic aneurysm (HCC)   Hyperkalemia   Hypotension   Leukocytosis   Normocytic anemia   Acute pulmonary edema (HCC)   HAP (hospital-acquired pneumonia)   Goals of care, counseling/discussion   Delirium   Bilateral pleural effusion    1.  Atrial flutter with RVR, on Eliquis for stroke prevention, cardizem CD 180 daily, metoprolol tartrate 50 mg 3 times daily, and amiodarone 200 mg twice daily, currently in sinus rhythm rate 90s to sinus tach at 100  2.  Acute on chronic systolic congestive heart failure, HFrEF, EF 25-30%/20 11/2021, on torsemide 20 mg daily and good medical management as tolerated (metoprolol to tartrate, isosorbide mononitrate) 3.  COPD 4.  Chronic kidney disease stage IIIa   Recommendations   1.  Agree with current  therapy 2.  holding torsemide 20 mg daily per nephrology, now on continuous dextrose infusion at 57m/hr 3.  Continue Eliquis for stroke prevention 4.  Continue amiodarone 200 mg twice daily 5.  Continue metoprolol tartrate 50 mg twice daily 6.  Continue Cardizem CD 180 mg daily for now during acute illness, would recommend discontinuing after hospital discharge follow up with her reduced EF 7.  Follow-up with Dr. CClayborn Bignessafter discharge  This patient's plan of care was discussed and created with Dr. PSaralyn Pilarand he is in agreement.    LTristan Schroeder PVermont1/23/2024 8:38 AM

## 2022-08-04 NOTE — Progress Notes (Signed)
[facility]  ROUNDING NOTE   Subjective:   [name] with a past medical history of chronic diastolic heart failure, morbid obesity, hypothyroidism, diabetes mellitus type 2, obstructive sleep apnea, lymphedema, and chronic kidney disease stage IIIa.  Patient presents to the emergency department with complaints of shortness of breath.  She has been admitted for Acute pulmonary edema (HCC) [J81.0] Atrial fibrillation with RVR (HCC) [I48.91] HCAP (healthcare-associated pneumonia) [J18.9] Acute on chronic respiratory failure with hypoxia and hypercapnia ([place]) [V89.38, J96.22]  Patient is known to our practice and is seen outpatient by Dr. Holley Raring.  Patient was last seen in our office on April 03, 2021.  Patient has been lost to follow-up since that time.    Patient seen sitting up in bed Currently eating breakfast Alert and oriented to self and place Remains on 6L [place]    Objective:  Vital signs in last 24 hours:  Temp:  [97.7 F (36.5 C)-98.2 F (36.8 C)] 97.9 F (36.6 C) (01/23 0730) Pulse Rate:  [100-108] 100 (01/23 1023) Resp:  [16-20] 18 (01/23 1023) BP: (103-143)/(68-109) 112/85 (01/23 0730) SpO2:  [91 %-100 %] 96 % (01/23 1023) FiO2 (%):  [40 %] 40 % (01/22 2240)  Weight change:  Filed Weights   07/23/22 0500 07/28/22 0904 08/01/22 0500  Weight: 135.8 kg (!) 137.5 kg 133.5 kg    Intake/Output: I/O last 3 completed shifts: In: 845.6 [I.V.:845.6] Out: 315 [Urine:315]   Intake/Output this shift:  Total I/O In: 240 [P.O.:240] Out: 0   Physical Exam: General: NAD  Head: Normocephalic, atraumatic. Moist oral mucosal membranes  Eyes: Anicteric  Lungs:  Wheezing, [place] O2  Heart: Regular rate and rhythm  Abdomen:  Soft, nontender, obese  Extremities: No peripheral edema.  Neurologic: Alert, able to move all four extremities  Skin: No lesions  Access: None    Basic Metabolic Panel: Recent Labs  Lab 07/29/22 0626 07/30/22 0700  07/31/22 0533 08/01/22 1322 08/02/22 0901 08/03/22 0310 08/04/22 0836  NA 137 136 140 140 143 141 141  K 3.1* 3.0* 4.2 4.8 5.5* 4.1 4.3  CL 92* 89* 95* 93* 97* 95* 95*  CO2 35* 38* 34* 30 25 32 32  GLUCOSE 91 131* 126* 116* 29* 85 98  BUN 51* 40* 32* 45* 54* 60* 69*  CREATININE 1.23* 1.00 0.99 1.63* 2.16* 2.24* 2.23*  CALCIUM 8.4* 8.2* 8.7* 8.8* 9.0 8.1* 8.3*  MG 2.1 1.8 1.9 2.7*  --   --  2.5*  PHOS  --   --   --   --   --   --  4.5     Liver Function Tests: No results for input(s): "AST", "ALT", "ALKPHOS", "BILITOT", "PROT", "ALBUMIN" in the last 168 hours.  No results for input(s): "LIPASE", "AMYLASE" in the last 168 hours. No results for input(s): "AMMONIA" in the last 168 hours.  CBC: Recent Labs  Lab 07/31/22 0533 08/01/22 1322 08/02/22 0901 08/03/22 0310 08/04/22 0836  WBC 14.3* 13.9* 11.0* 12.7* 9.9  NEUTROABS  --  10.6* 8.8* 10.5* 7.8*  HGB 12.0 12.6 13.2 10.7* 11.7*  HCT 42.8 43.9 47.8* 37.8 40.9  MCV 91.1 90.0 92.8 88.1 88.5  PLT 363 458* 444* 412* 446*     Cardiac Enzymes: No results for input(s): "CKTOTAL", "CKMB", "CKMBINDEX", "TROPONINI" in the last 168 hours.  BNP: Invalid input(s): "POCBNP"  CBG: Recent Labs  Lab 08/03/22 0006 08/03/22 0827 08/03/22 1156 08/03/22 1522 08/04/22 0729  GLUCAP 86 105* 134* 109* 81  Microbiology: Results for orders placed or performed during the hospital encounter of 07/24/2022  Blood culture (routine x 2)     Status: None   Collection Time: 08/04/2022 11:01 PM   Specimen: BLOOD  Result Value Ref Range Status   Specimen Description BLOOD BLOOD RIGHT WRIST  Final   Special Requests   Final    BOTTLES DRAWN AEROBIC AND ANAEROBIC Blood Culture results may not be optimal due to an inadequate volume of blood received in culture bottles   Culture   Final    NO GROWTH 5 DAYS Performed at [facility], [address], [place], [place] [address]    Report Status 07/22/2022 FINAL  Final  Blood  culture (routine x 2)     Status: None   Collection Time: 07/15/2022 11:01 PM   Specimen: BLOOD  Result Value Ref Range Status   Specimen Description BLOOD BLOOD LEFT HAND  Final   Special Requests IN PEDIATRIC BOTTLE Blood Culture adequate volume  Final   Culture   Final    NO GROWTH 5 DAYS Performed at [facility], [address] [place], [place] [address]    Report Status 07/22/2022 FINAL  Final  MRSA Next Gen by PCR, Nasal     Status: None   Collection Time: 07/18/22 10:11 AM   Specimen: Nasal Mucosa; Nasal Swab  Result Value Ref Range Status   MRSA by PCR Next Gen NOT DETECTED NOT DETECTED Final    Comment: (NOTE) The GeneXpert MRSA Assay (FDA approved for NASAL specimens only), is one component of a comprehensive MRSA colonization surveillance program. It is not intended to diagnose MRSA infection nor to guide or monitor treatment for MRSA infections. Test performance is not FDA approved in patients less than 42 years old. Performed at [facility], [place], [place] [address]   Respiratory (~20 pathogens) panel by PCR     Status: None   Collection Time: 07/18/22 11:07 AM   Specimen: Nasopharyngeal Swab; Respiratory  Result Value Ref Range Status   Adenovirus NOT DETECTED NOT DETECTED Final   Coronavirus 229E NOT DETECTED NOT DETECTED Final    Comment: (NOTE) The Coronavirus on the Respiratory Panel, DOES NOT test for the novel  Coronavirus (2019 nCoV)    Coronavirus HKU1 NOT DETECTED NOT DETECTED Final   Coronavirus NL63 NOT DETECTED NOT DETECTED Final   Coronavirus OC43 NOT DETECTED NOT DETECTED Final   Metapneumovirus NOT DETECTED NOT DETECTED Final   Rhinovirus / Enterovirus NOT DETECTED NOT DETECTED Final   Influenza A NOT DETECTED NOT DETECTED Final   Influenza B NOT DETECTED NOT DETECTED Final   Parainfluenza Virus 1 NOT DETECTED NOT DETECTED Final   Parainfluenza Virus 2 NOT DETECTED NOT DETECTED Final   Parainfluenza Virus  3 NOT DETECTED NOT DETECTED Final   Parainfluenza Virus 4 NOT DETECTED NOT DETECTED Final   Respiratory Syncytial Virus NOT DETECTED NOT DETECTED Final   Bordetella pertussis NOT DETECTED NOT DETECTED Final   Bordetella Parapertussis NOT DETECTED NOT DETECTED Final   Chlamydophila pneumoniae NOT DETECTED NOT DETECTED Final   Mycoplasma pneumoniae NOT DETECTED NOT DETECTED Final    Comment: Performed at [facility], [place]. [address], [place], [place] [address]  Urine Culture     Status: None   Collection Time: 07/23/22  6:00 AM   Specimen: Urine, Clean Catch  Result Value Ref Range Status   Specimen Description   Final    URINE, CLEAN CATCH Performed  at [facility], [address] [place], [place] [address]    Special Requests   Final    Normal Performed at [facility], [address] [place], [place] [address]    Culture   Final    NO GROWTH Performed at [facility], [place] [address], [place], [place] [address]    Report Status 07/24/2022 FINAL  Final    Coagulation Studies: No results for input(s): "LABPROT", "INR" in the last 72 hours.  Urinalysis: No results for input(s): "COLORURINE", "LABSPEC", "PHURINE", "GLUCOSEU", "HGBUR", "BILIRUBINUR", "KETONESUR", "PROTEINUR", "UROBILINOGEN", "NITRITE", "LEUKOCYTESUR" in the last 72 hours.  Invalid input(s): "APPERANCEUR"    Imaging: DG Chest Port 1 View  Result Date: 08/03/2022 CLINICAL DATA:  Dyspnea EXAM: PORTABLE CHEST 1 VIEW COMPARISON:  Radiograph 07/27/2022 FINDINGS: Unchanged enlarged cardiac silhouette and pulmonary vascular congestion. There are bilateral pleural effusions and lower lung airspace opacities, similar to prior exam. No evidence of pneumothorax. Bones are unchanged. IMPRESSION: Unchanged cardiomegaly and pulmonary vascular congestion with bilateral pleural effusions and lower lung airspace disease, not significantly changed from prior exam Electronically Signed   By: Maurine Simmering M.D.   On: 08/03/2022 12:50     Medications:    dextrose 5 % and 0.45% NaCl 50 mL/hr at 08/04/22 0250    amiodarone  200 mg Oral BID   apixaban  5 mg Oral BID   atorvastatin  20 mg Oral Daily   budesonide (PULMICORT) nebulizer solution  0.25 mg Nebulization BID   buPROPion  150 mg Oral Daily   diltiazem  180 mg Oral Daily   feeding supplement  237 mL Oral BID BM   hydrocerin   Topical BID   ipratropium-albuterol  3 mL Nebulization BID   isosorbide mononitrate  15 mg Oral BID   levothyroxine  250 mcg Oral Q0600   metoprolol tartrate  50 mg Oral BID   midodrine  10 mg Oral TID WC   pantoprazole  40 mg Oral Daily   rOPINIRole  1 mg Oral QHS   acetaminophen, guaiFENesin, levalbuterol, liver oil-zinc oxide, metoprolol tartrate, ondansetron (ZOFRAN) IV, polyethylene glycol, senna-docusate  Assessment/ Plan:  Ms. Madison Mcbride is a 67 y.o.  female  past medical history of chronic diastolic heart failure, morbid obesity, hypothyroidism, diabetes mellitus type 2, obstructive sleep apnea, lymphedema, and chronic kidney disease stage IIIa.  Patient presents to the emergency department with complaints of shortness of breath.  She has been admitted for Acute pulmonary edema (HCC) [J81.0] Atrial fibrillation with RVR (HCC) [I48.91] HCAP (healthcare-associated pneumonia) [J18.9] Acute on chronic respiratory failure with hypoxia and hypercapnia (HCC) [O13.08, J96.22]   Acute Kidney Injury on chronic kidney disease stage IIIa with baseline creatinine 1.35 and GFR of 54 on 07/16/22.  Acute kidney injury appears multifactorial due to hypotension and hemodynamic instability. Renal ultrasound negative for obstruction.    Creatinine stable today. Continue IVF. Appetite appropriate. Will continue to monitor renal function. No acute need for dialysis. Continue to avoid nephrotoxic agents and therapies.   Lab Results  Component Value Date   CREATININE 2.23 (H) 08/04/2022   CREATININE 2.24  (H) 08/03/2022   CREATININE 2.16 (H) 08/02/2022    Intake/Output Summary (Last 24 hours) at 08/04/2022 1113 Last data filed at 08/04/2022 1000 Gross per 24 hour  Intake 240 ml  Output 315 ml  Net -75 ml    2.  Acute on chronic systolic heart failure.  Echo completed on 03/06/2022 shows EF 45 to 50%  with a mild to moderate MVR, TVR, and AVR.  Cardiology following.   3. Anemia of chronic kidney disease  Normocytic Lab Results  Component Value Date   HGB 11.7 (L) 08/04/2022  Hemoglobin stable  4.  Hypertension with chronic kidney disease. Receiving diltiazem, hydralazine, isosorbide, and metoprolol.  Torsemide held. Receiving midodrine '10mg'$  three times daily. Blood pressure 112/85 today.      LOS: [place] 1/23/202411:13 AM

## 2022-08-04 NOTE — Progress Notes (Addendum)
Progress Note    Madison Mcbride  LOV:[id number] DOB: [birth year]  DOA: 07/29/2022 PCP: Rusty Aus, MD      Brief Narrative:    Medical records reviewed and are as summarized below:  Madison Mcbride is a 67 y.o. female with medical history significant for morbid obesity, chronic diastolic CHF, hypothyroidism, type II DM, obstructive sleep apnea, lymphedema, CKD stage IIIa, OSA, chronic hypoxic and hypercapnic respiratory failure on 2 L/min oxygen and trilogy machine at night.       Assessment/Plan:   Principal Problem:   Paroxysmal atrial fibrillation with RVR (HCC) Active Problems:   Acute on chronic respiratory failure with hypoxia and hypercapnia (HCC)   Acute metabolic encephalopathy   Acute on chronic systolic CHF (congestive heart failure) (HCC)   HLD (hyperlipidemia)   OSA (obstructive sleep apnea)   Type 2 diabetes mellitus with diabetic neuropathy, without long-term current use of insulin (HCC)   Elevated troponin   Hypothyroidism   Acute respiratory failure with hypoxia (HCC)   AKI (acute kidney injury) ([place])   Thoracic aortic aneurysm (HCC)   Hyperkalemia   Hypotension   Leukocytosis   Normocytic anemia   Acute pulmonary edema (HCC)   HAP (hospital-acquired pneumonia)   Goals of care, counseling/discussion   Delirium   Bilateral pleural effusion   Body mass index is 48.98 kg/m.  (Morbid obesity)   Acute on chronic systolic CHF, pulmonary hypertension: Torsemide on hold.  2D echo showed EF estimated at 25 to 30%, RVSP 50.7 mmHg, BNP 2,302   AKI on CKD stage IIIa, hyperkalemia: Creatinine was 2.24 yesterday.  Is 2.23 today.  Creatinine may have peaked.  She is on IV fluids for hydration.  Will Foley catheter was placed on 08/03/2022 because of acute urinary retention and to monitor urine output. Follow-up with nephrologist.   Acute on chronic hypoxic and hypercapnic respiratory failure, COPD, OHS, OSA: Oxygen requirement has gone  up.  She was requiring up to 6 L/min oxygen via [place].  She is also on BiPAP intermittently because of difficulty breathing.  Continue BiPAP at night. She uses 2 L/min oxygen at baseline.     Bilateral pleural effusion, R>L: Right thoracentesis was ordered on 08/03/2022.  Risks, benefits of thoracentesis were explained to the patient.  She said she will think about it.  I also spoke to her husband about the procedure as well.  He said he has not been able to call patient's patient to have the procedure.  He will continue to try.  He said patient had agreed to do the procedure yesterday evening but she later said she will think about it.   Atrial fibrillation with RVR: Overall, heart rate is better.  She is in mild sinus tachycardia.  Continue amiodarone, Cardizem and metoprolol per cardiologist.  She is on Eliquis for stroke prophylaxis.   Hypotension: Continue midodrine   Acute toxic metabolic encephalopathy, delirium: Improved.  Completed 3-day course of Haldol on 08/02/2022.  CT head obtained on 08/01/2022 did not show any acute abnormality.    Recent hypoglycemia, hypokalemia: Resolved   Pneumonia: Completed antibiotics on 07/25/2022.  Other comorbidities include type II DM, restless leg syndrome, hypothyroidism, chronic venous stasis, anxiety, depression, chronic pain      Diet Order             Diet 2 gram sodium Room service appropriate? No; Fluid consistency: Thin  Diet effective now  Consultants: Nephrologist Cardiologist  Procedures: None    Medications:    amiodarone  200 mg Oral BID   apixaban  5 mg Oral BID   atorvastatin  20 mg Oral Daily   budesonide (PULMICORT) nebulizer solution  0.25 mg Nebulization BID   buPROPion  150 mg Oral Daily   diltiazem  180 mg Oral Daily   feeding supplement  237 mL Oral BID BM   hydrocerin   Topical BID   ipratropium-albuterol  3 mL Nebulization BID   isosorbide mononitrate  15 mg Oral  BID   levothyroxine  250 mcg Oral Q0600   metoprolol tartrate  50 mg Oral BID   midodrine  10 mg Oral TID WC   pantoprazole  40 mg Oral Daily   rOPINIRole  1 mg Oral QHS   Continuous Infusions:  dextrose 5 % and 0.45% NaCl 50 mL/hr at 08/04/22 0250       Anti-infectives (From admission, onward)    Start     Dose/Rate Route Frequency Ordered Stop   07/25/22 1200  amoxicillin-clavulanate (AUGMENTIN) 500-125 MG per tablet 1 tablet        1 tablet Oral 2 times daily 07/25/22 1102 07/26/22 2117   07/24/22 1400  Ampicillin-Sulbactam (UNASYN) 3 g in sodium chloride 0.9 % 100 mL IVPB  Status:  Discontinued        3 g 200 mL/hr over 30 Minutes Intravenous Every 6 hours 07/24/22 1228 07/24/22 1231   07/24/22 1330  amoxicillin-clavulanate (AUGMENTIN) 875-125 MG per tablet 1 tablet  Status:  Discontinued        1 tablet Oral Every 12 hours 07/24/22 1231 07/25/22 1102   07/21/22 1500  Ampicillin-Sulbactam (UNASYN) 3 g in sodium chloride 0.9 % 100 mL IVPB        3 g 200 mL/hr over 30 Minutes Intravenous Every 6 hours 07/21/22 1357 07/22/22 2216   07/18/22 1900  azithromycin (ZITHROMAX) 500 mg in sodium chloride 0.9 % 250 mL IVPB  Status:  Discontinued        500 mg 250 mL/hr over 60 Minutes Intravenous Every 24 hours 07/18/22 0205 07/19/22 1204   07/18/22 1000  piperacillin-tazobactam (ZOSYN) IVPB 3.375 g  Status:  Discontinued        3.375 g 12.5 mL/hr over 240 Minutes Intravenous Every 8 hours 07/18/22 0842 07/21/22 1357   07/18/22 0800  cefTRIAXone (ROCEPHIN) 1 g in sodium chloride 0.9 % 100 mL IVPB  Status:  Discontinued        1 g 200 mL/hr over 30 Minutes Intravenous Every 24 hours 07/18/22 0205 07/18/22 0829   07/16/2022 1915  vancomycin (VANCOREADY) IVPB 2000 mg/400 mL        2,000 mg 200 mL/hr over 120 Minutes Intravenous  Once 07/16/2022 1914 08/09/2022 2250   07/30/2022 1900  ceFEPIme (MAXIPIME) 2 g in sodium chloride 0.9 % 100 mL IVPB        2 g 200 mL/hr over 30 Minutes Intravenous   Once 07/29/2022 1853 07/18/22 1900   07/27/2022 1900  azithromycin (ZITHROMAX) 500 mg in sodium chloride 0.9 % 250 mL IVPB        500 mg 250 mL/hr over 60 Minutes Intravenous  Once 08/05/2022 1853 07/18/22 1900              Family Communication/Anticipated D/C date and plan/Code Status   DVT prophylaxis:  apixaban (ELIQUIS) tablet 5 mg     Code Status: Full Code  Family Communication: Husband over the phone  Disposition Plan: Plan to discharge to nursing home when medically stable   Status is: Inpatient Remains inpatient appropriate because: AKI, hypoxia      Subjective:   Interval events noted.  She said she feels a little better today.  She was on oxygen via nasal cannula when I walked into her room.  Objective:    Vitals:   08/04/22 0730 08/04/22 0743 08/04/22 1023 08/04/22 1143  BP: 112/85   108/78  Pulse: (!) 104  100 (!) 106  Resp: '19  18 16  '$ Temp: 97.9 F (36.6 C)   97.8 F (36.6 C)  TempSrc: Oral Oral  Axillary  SpO2: 100%  96% 98%  Weight:      Height:       No data found.   Intake/Output Summary (Last 24 hours) at 08/04/2022 1447 Last data filed at 08/04/2022 1143 Gross per 24 hour  Intake 240 ml  Output 315 ml  Net -75 ml   Filed Weights   07/23/22 0500 07/28/22 0904 08/01/22 0500  Weight: 135.8 kg (!) 137.5 kg 133.5 kg    Exam:  GEN: NAD SKIN: Warm and dry EYES: EOMI ENT: MMM CV: RRR, tachycardic PULM: Decreased air entry at the lung bases, no wheezing ABD: soft, ND, NT, +BS CNS: AAO x 3, non focal EXT: No edema or tenderness      Data Reviewed:   I have personally reviewed following labs and imaging studies:  Labs: Labs show the following:   Basic Metabolic Panel: Recent Labs  Lab 07/29/22 0626 07/30/22 0700 07/31/22 0533 08/01/22 1322 08/02/22 0901 08/03/22 0310 08/04/22 0836  NA 137 136 140 140 143 141 141  K 3.1* 3.0* 4.2 4.8 5.5* 4.1 4.3  CL 92* 89* 95* 93* 97* 95* 95*  CO2 35* 38* 34* 30 25 32 32   GLUCOSE 91 131* 126* 116* 29* 85 98  BUN 51* 40* 32* 45* 54* 60* 69*  CREATININE 1.23* 1.00 0.99 1.63* 2.16* 2.24* 2.23*  CALCIUM 8.4* 8.2* 8.7* 8.8* 9.0 8.1* 8.3*  MG 2.1 1.8 1.9 2.7*  --   --  2.5*  PHOS  --   --   --   --   --   --  4.5   GFR Estimated Creatinine Clearance: 33.4 mL/min (A) (by C-G formula based on SCr of 2.23 mg/dL (H)). Liver Function Tests: No results for input(s): "AST", "ALT", "ALKPHOS", "BILITOT", "PROT", "ALBUMIN" in the last 168 hours.  No results for input(s): "LIPASE", "AMYLASE" in the last 168 hours. No results for input(s): "AMMONIA" in the last 168 hours. Coagulation profile No results for input(s): "INR", "PROTIME" in the last 168 hours.  CBC: Recent Labs  Lab 07/31/22 0533 08/01/22 1322 08/02/22 0901 08/03/22 0310 08/04/22 0836  WBC 14.3* 13.9* 11.0* 12.7* 9.9  NEUTROABS  --  10.6* 8.8* 10.5* 7.8*  HGB 12.0 12.6 13.2 10.7* 11.7*  HCT 42.8 43.9 47.8* 37.8 40.9  MCV 91.1 90.0 92.8 88.1 88.5  PLT 363 458* 444* 412* 446*   Cardiac Enzymes: No results for input(s): "CKTOTAL", "CKMB", "CKMBINDEX", "TROPONINI" in the last 168 hours. BNP (last 3 results) No results for input(s): "PROBNP" in the last 8760 hours. CBG: Recent Labs  Lab 08/03/22 0006 08/03/22 0827 08/03/22 1156 08/03/22 1522 08/04/22 0729  GLUCAP 86 105* 134* 109* 81   D-Dimer: No results for input(s): "DDIMER" in the last 72 hours. Hgb A1c: No results for input(s): "HGBA1C" in the last 72 hours. Lipid Profile: No results for input(s): "  CHOL", "HDL", "LDLCALC", "TRIG", "CHOLHDL", "LDLDIRECT" in the last 72 hours. Thyroid function studies: No results for input(s): "TSH", "T4TOTAL", "T3FREE", "THYROIDAB" in the last 72 hours.  Invalid input(s): "FREET3" Anemia work up: No results for input(s): "VITAMINB12", "FOLATE", "FERRITIN", "TIBC", "IRON", "RETICCTPCT" in the last 72 hours. Sepsis Labs: Recent Labs  Lab 08/01/22 1322 08/02/22 0901 08/03/22 0310 08/04/22 0836   WBC 13.9* 11.0* 12.7* 9.9    Microbiology No results found for this or any previous visit (from the past 240 hour(s)).   Procedures and diagnostic studies:  DG Chest Port 1 View  Result Date: 08/03/2022 CLINICAL DATA:  Dyspnea EXAM: PORTABLE CHEST 1 VIEW COMPARISON:  Radiograph 07/27/2022 FINDINGS: Unchanged enlarged cardiac silhouette and pulmonary vascular congestion. There are bilateral pleural effusions and lower lung airspace opacities, similar to prior exam. No evidence of pneumothorax. Bones are unchanged. IMPRESSION: Unchanged cardiomegaly and pulmonary vascular congestion with bilateral pleural effusions and lower lung airspace disease, not significantly changed from prior exam Electronically Signed   By: Maurine Simmering M.D.   On: 08/03/2022 12:50               LOS: 18 days   Madison Mcbride  [facility]   Pager on [email or web address]. If 7PM-7AM, please contact night-coverage at [email or web address]     08/04/2022, 2:47 PM

## 2022-08-04 NOTE — Progress Notes (Addendum)
Physical Therapy Re-Assessment Patient Details Name: Madison Mcbride MRN: [id number] DOB: 1956/05/04 Today's Date: 08/04/2022   History of Present Illness 67 y.o. female with medical history significant of thyroid cancer, hypothyroidism, hyperlipidemia, Dm2, CKD stage 3, Pafib , CHFpEF, OSA on CPAP, was discharged earlier today to SNF and found to be tachycardic on intake and sent back to ED.    PT Comments    Pt received in bed agreeable to participate in exs and to sit on EOB.  Pt on BiPAP maintaining 16 to 21 RR. Pt O2 >90% Pt performed Bed mobility with Max of 2, Sat on the EOB f or~5 mins with mod to max assist to maintain sitting balance. Pt performed AROM to BLE to improve endurance and strength. Pt advised to do the same every 2 hours for proper carryover. Spouse present during the session and demonstrated good understanding. Pt re- assessed the pt today and found pt is showing extremely slow to no progress since the admission 2/2 to medical/ cardiopulmonary conditions and general deconditioning.  Pt goals remain appropriate at this point and therefore PT has revised the goals to help pt achieve her goals.  Pt made comfortable in bed with Max of 2. PT will continue. Current POC is appropriate.    Recommendations for follow up therapy are one component of a multi-disciplinary discharge planning process, led by the attending physician.  Recommendations may be updated based on patient status, additional functional criteria and insurance authorization.  Follow Up Recommendations  Skilled nursing-short term rehab (<3 hours/day) Can patient physically be transported by private vehicle: No   Assistance Recommended at Discharge    Patient can return home with the following Two people to help with walking and/or transfers;Help with stairs or ramp for entrance;Assist for transportation;Assistance with cooking/housework;Direct supervision/assist for medications management;Direct  supervision/assist for financial management;Two people to help with bathing/dressing/bathroom   Equipment Recommendations  Wheelchair (measurements PT);Wheelchair cushion (measurements PT);Hospital bed;BSC/3in1;Other (comment)    Recommendations for Other Services       Precautions / Restrictions Precautions Precautions: Fall Restrictions Weight Bearing Restrictions: No     Mobility  Bed Mobility Overal bed mobility: Needs Assistance Bed Mobility: Rolling, Supine to Sit, Sit to Supine Rolling: Max assist   Supine to sit: Max assist, +2 for physical assistance, HOB elevated Sit to supine: Max assist, +2 for physical assistance   General bed mobility comments: pt sat onthe EOB for 5 mins with max assit to maintain sitting. pt is very weak and obese    Transfers                   General transfer comment: Not appropraite 2/2 to BiPAP  on .    Ambulation/Gait               General Gait Details: deferred   Stairs             Wheelchair Mobility    Modified Rankin (Stroke Patients Only)       Balance Overall balance assessment: Needs assistance Sitting-balance support: Feet supported, Bilateral upper extremity supported Sitting balance-Leahy Scale: Poor Sitting balance - Comments: max assit to maintain static sitting balance Postural control: Right lateral lean                                  Cognition Arousal/Alertness: Awake/alert Behavior During Therapy: WFL for tasks assessed/performed Overall Cognitive Status: Impaired/Different from baseline Area  of Impairment: Following commands, Memory                     Memory: Decreased recall of precautions, Decreased short-term memory Following Commands: Follows multi-step commands inconsistently       General Comments: and O x 3.        Exercises General Exercises - Lower Extremity Ankle Circles/Pumps: AROM, 10 reps, Supine, Both Heel Slides: AROM, 10 reps,  Both, Seated Other Exercises Other Exercises: pt instructed to do the samr every 2 hours 10 reps each.    General Comments        Pertinent Vitals/Pain Pain Assessment Pain Assessment: No/denies pain    Home Living                          Prior Function            PT Goals (current goals can now be found in the care plan section) Progress towards PT goals: Progressing toward goals    Frequency    Min 2X/week      PT Plan Current plan remains appropriate    Co-evaluation              AM-PAC PT "6 Clicks" Mobility   Outcome Measure  Help needed turning from your back to your side while in a flat bed without using bedrails?: A Lot Help needed moving from lying on your back to sitting on the side of a flat bed without using bedrails?: A Lot Help needed moving to and from a bed to a chair (including a wheelchair)?: Total Help needed standing up from a chair using your arms (e.g., wheelchair or bedside chair)?: Total Help needed to walk in hospital room?: Total Help needed climbing 3-5 steps with a railing? : Total 6 Click Score: 8    End of Session Equipment Utilized During Treatment: Oxygen Activity Tolerance: Patient limited by fatigue;Treatment limited secondary to medical complications (Comment) Patient left: in bed;with bed alarm set;with family/visitor present Nurse Communication: Mobility status;Precautions PT Visit Diagnosis: Unsteadiness on feet (R26.81);Muscle weakness (generalized) (M62.81)     Time: 5188-4166 PT Time Calculation (min) (ACUTE ONLY): 14 min  Charges:  $Therapeutic Activity: 8-22 mins                    Ariona Deschene PT DPT 5:33 PM,08/04/22

## 2022-08-05 ENCOUNTER — Inpatient Hospital Stay: Payer: Medicare Other

## 2022-08-05 DIAGNOSIS — I480 Paroxysmal atrial fibrillation: Secondary | ICD-10-CM | POA: Diagnosis not present

## 2022-08-05 DIAGNOSIS — Z7189 Other specified counseling: Secondary | ICD-10-CM | POA: Diagnosis not present

## 2022-08-05 LAB — BASIC METABOLIC PANEL
Anion gap: 12 (ref 5–15)
BUN: 71 mg/dL — ABNORMAL HIGH (ref 8–23)
CO2: 34 mmol/L — ABNORMAL HIGH (ref 22–32)
Calcium: 8.1 mg/dL — ABNORMAL LOW (ref 8.9–10.3)
Chloride: 96 mmol/L — ABNORMAL LOW (ref 98–111)
Creatinine, Ser: 2.19 mg/dL — ABNORMAL HIGH (ref 0.44–1.00)
GFR, Estimated: 24 mL/min — ABNORMAL LOW (ref >60)
Glucose, Bld: 81 mg/dL (ref 70–99)
Potassium: 4 mmol/L (ref 3.5–5.1)
Sodium: 142 mmol/L (ref 135–145)

## 2022-08-05 LAB — GLUCOSE, CAPILLARY
Glucose-Capillary: 90 mg/dL (ref 70–99)
Glucose-Capillary: 83 mg/dL (ref 70–99)

## 2022-08-05 NOTE — Progress Notes (Signed)
[facility]  ROUNDING NOTE   Subjective:   Madison Mcbride with a past medical history of chronic diastolic heart failure, morbid obesity, hypothyroidism, diabetes mellitus type 2, obstructive sleep apnea, lymphedema, and chronic kidney disease stage IIIa.  Patient presents to the emergency department with complaints of shortness of breath.  She has been admitted for Acute pulmonary edema (HCC) [J81.0] Atrial fibrillation with RVR (HCC) [I48.91] HCAP (healthcare-associated pneumonia) [J18.9] Acute on chronic respiratory failure with hypoxia and hypercapnia ([place]) [Y69.48, J96.22]  Patient is known to our practice and is seen outpatient by Dr. Holley Raring.  Patient was last seen in our office on April 03, 2021.  Patient has been lost to follow-up since that time.    Patient seen resting quietly, alert and oriented No family at bedside Patient remains on nasal cannula Reports generalized soreness from lower extremities   Objective:  Vital signs in last 24 hours:  Temp:  [97.4 F (36.3 C)-98.3 F (36.8 C)] 97.4 F (36.3 C) (01/24 1149) Pulse Rate:  [98-103] 98 (01/24 1149) Resp:  [14-29] 14 (01/24 1149) BP: (96-112)/(66-82) 112/82 (01/24 1149) SpO2:  [91 %-100 %] 98 % (01/24 1149) FiO2 (%):  [40 %] 40 % (01/23 2019)  Weight change:  Filed Weights   07/23/22 0500 07/28/22 0904 08/01/22 0500  Weight: 135.8 kg (!) 137.5 kg 133.5 kg    Intake/Output: I/O last 3 completed shifts: In: 2189.3 [P.O.:240; I.V.:1949.3] Out: 865 [Urine:865]   Intake/Output this shift:  Total I/O In: -  Out: 500 [Urine:500]  Physical Exam: General: NAD  Head: Normocephalic, atraumatic. Moist oral mucosal membranes  Eyes: Anicteric  Lungs:  Diminished in bases, [place] O2  Heart: Regular rate and rhythm  Abdomen:  Soft, nontender, obese  Extremities: No peripheral edema.  Neurologic: Alert, able to move all four extremities  Skin: No lesions  Access: None    Basic Metabolic  Panel: Recent Labs  Lab 07/30/22 0700 07/31/22 0533 08/01/22 1322 08/02/22 0901 08/03/22 0310 08/04/22 0836 08/05/22 0407  NA 136 140 140 143 141 141 142  K 3.0* 4.2 4.8 5.5* 4.1 4.3 4.0  CL 89* 95* 93* 97* 95* 95* 96*  CO2 38* 34* 30 25 32 32 34*  GLUCOSE 131* 126* 116* 29* 85 98 81  BUN 40* 32* 45* 54* 60* 69* 71*  CREATININE 1.00 0.99 1.63* 2.16* 2.24* 2.23* 2.19*  CALCIUM 8.2* 8.7* 8.8* 9.0 8.1* 8.3* 8.1*  MG 1.8 1.9 2.7*  --   --  2.5*  --   PHOS  --   --   --   --   --  4.5  --      Liver Function Tests: No results for input(s): "AST", "ALT", "ALKPHOS", "BILITOT", "PROT", "ALBUMIN" in the last 168 hours.  No results for input(s): "LIPASE", "AMYLASE" in the last 168 hours. No results for input(s): "AMMONIA" in the last 168 hours.  CBC: Recent Labs  Lab 07/31/22 0533 08/01/22 1322 08/02/22 0901 08/03/22 0310 08/04/22 0836  WBC 14.3* 13.9* 11.0* 12.7* 9.9  NEUTROABS  --  10.6* 8.8* 10.5* 7.8*  HGB 12.0 12.6 13.2 10.7* 11.7*  HCT 42.8 43.9 47.8* 37.8 40.9  MCV 91.1 90.0 92.8 88.1 88.5  PLT 363 458* 444* 412* 446*     Cardiac Enzymes: No results for input(s): "CKTOTAL", "CKMB", "CKMBINDEX", "TROPONINI" in the last 168 hours.  BNP: Invalid input(s): "POCBNP"  CBG: Recent Labs  Lab 08/04/22 0729 08/04/22 1653 08/04/22 2123 08/04/22 2351 08/05/22 0402  GLUCAP 81  97 110* 141* 42     Microbiology: Results for orders placed or performed during the hospital encounter of 07/13/2022  Blood culture (routine x 2)     Status: None   Collection Time: 07/15/2022 11:01 PM   Specimen: BLOOD  Result Value Ref Range Status   Specimen Description BLOOD BLOOD RIGHT WRIST  Final   Special Requests   Final    BOTTLES DRAWN AEROBIC AND ANAEROBIC Blood Culture results may not be optimal due to an inadequate volume of blood received in culture bottles   Culture   Final    NO GROWTH 5 DAYS Performed at [facility], [address], [place], [place] [address]     Report Status 07/22/2022 FINAL  Final  Blood culture (routine x 2)     Status: None   Collection Time: 08/08/2022 11:01 PM   Specimen: BLOOD  Result Value Ref Range Status   Specimen Description BLOOD BLOOD LEFT HAND  Final   Special Requests IN PEDIATRIC BOTTLE Blood Culture adequate volume  Final   Culture   Final    NO GROWTH 5 DAYS Performed at [facility], [address] [place], [place] [address]    Report Status 07/22/2022 FINAL  Final  MRSA Next Gen by PCR, Nasal     Status: None   Collection Time: 07/18/22 10:11 AM   Specimen: Nasal Mucosa; Nasal Swab  Result Value Ref Range Status   MRSA by PCR Next Gen NOT DETECTED NOT DETECTED Final    Comment: (NOTE) The GeneXpert MRSA Assay (FDA approved for NASAL specimens only), is one component of a comprehensive MRSA colonization surveillance program. It is not intended to diagnose MRSA infection nor to guide or monitor treatment for MRSA infections. Test performance is not FDA approved in patients less than 14 years old. Performed at [facility], [place], [place] [address]   Respiratory (~20 pathogens) panel by PCR     Status: None   Collection Time: 07/18/22 11:07 AM   Specimen: Nasopharyngeal Swab; Respiratory  Result Value Ref Range Status   Adenovirus NOT DETECTED NOT DETECTED Final   Coronavirus 229E NOT DETECTED NOT DETECTED Final    Comment: (NOTE) The Coronavirus on the Respiratory Panel, DOES NOT test for the novel  Coronavirus (2019 nCoV)    Coronavirus HKU1 NOT DETECTED NOT DETECTED Final   Coronavirus NL63 NOT DETECTED NOT DETECTED Final   Coronavirus OC43 NOT DETECTED NOT DETECTED Final   Metapneumovirus NOT DETECTED NOT DETECTED Final   Rhinovirus / Enterovirus NOT DETECTED NOT DETECTED Final   Influenza A NOT DETECTED NOT DETECTED Final   Influenza B NOT DETECTED NOT DETECTED Final   Parainfluenza Virus 1 NOT DETECTED NOT DETECTED Final   Parainfluenza Virus 2 NOT  DETECTED NOT DETECTED Final   Parainfluenza Virus 3 NOT DETECTED NOT DETECTED Final   Parainfluenza Virus 4 NOT DETECTED NOT DETECTED Final   Respiratory Syncytial Virus NOT DETECTED NOT DETECTED Final   Bordetella pertussis NOT DETECTED NOT DETECTED Final   Bordetella Parapertussis NOT DETECTED NOT DETECTED Final   Chlamydophila pneumoniae NOT DETECTED NOT DETECTED Final   Mycoplasma pneumoniae NOT DETECTED NOT DETECTED Final    Comment: Performed at [facility], [place]. [address], [place], [place] [address]  Urine Culture     Status: None   Collection Time: 07/23/22  6:00 AM   Specimen: Urine, Clean Catch  Result Value Ref Range Status   Specimen Description  Final    URINE, CLEAN CATCH Performed at [facility], [address], [place], [place] [address]    Special Requests   Final    Normal Performed at [facility], [address] [place], [place] [address]    Culture   Final    NO GROWTH Performed at [facility], [place] [address], [place], [place] [address]    Report Status 07/24/2022 FINAL  Final    Coagulation Studies: No results for input(s): "LABPROT", "INR" in the last 72 hours.  Urinalysis: No results for input(s): "COLORURINE", "LABSPEC", "PHURINE", "GLUCOSEU", "HGBUR", "BILIRUBINUR", "KETONESUR", "PROTEINUR", "UROBILINOGEN", "NITRITE", "LEUKOCYTESUR" in the last 72 hours.  Invalid input(s): "APPERANCEUR"    Imaging: DG Chest Port 1 View  Result Date: 08/03/2022 CLINICAL DATA:  Dyspnea EXAM: PORTABLE CHEST 1 VIEW COMPARISON:  Radiograph 07/27/2022 FINDINGS: Unchanged enlarged cardiac silhouette and pulmonary vascular congestion. There are bilateral pleural effusions and lower lung airspace opacities, similar to prior exam. No evidence of pneumothorax. Bones are unchanged. IMPRESSION: Unchanged cardiomegaly and pulmonary vascular congestion with bilateral pleural effusions and lower lung airspace disease, not significantly changed  from prior exam Electronically Signed   By: Maurine Simmering M.D.   On: 08/03/2022 12:50     Medications:      amiodarone  200 mg Oral BID   apixaban  5 mg Oral BID   atorvastatin  20 mg Oral Daily   budesonide (PULMICORT) nebulizer solution  0.25 mg Nebulization BID   buPROPion  150 mg Oral Daily   Chlorhexidine Gluconate Cloth  6 each Topical Daily   diltiazem  180 mg Oral Daily   feeding supplement  237 mL Oral BID BM   hydrocerin   Topical BID   ipratropium-albuterol  3 mL Nebulization BID   isosorbide mononitrate  15 mg Oral BID   levothyroxine  250 mcg Oral Q0600   metoprolol tartrate  50 mg Oral BID   midodrine  10 mg Oral TID WC   pantoprazole  40 mg Oral Daily   rOPINIRole  1 mg Oral QHS   acetaminophen, guaiFENesin, levalbuterol, liver oil-zinc oxide, metoprolol tartrate, ondansetron (ZOFRAN) IV, polyethylene glycol, senna-docusate  Assessment/ Plan:  Ms. Madison Mcbride is a 67 y.o.  female  past medical history of chronic diastolic heart failure, morbid obesity, hypothyroidism, diabetes mellitus type 2, obstructive sleep apnea, lymphedema, and chronic kidney disease stage IIIa.  Patient presents to the emergency department with complaints of shortness of breath.  She has been admitted for Acute pulmonary edema (HCC) [J81.0] Atrial fibrillation with RVR (HCC) [I48.91] HCAP (healthcare-associated pneumonia) [J18.9] Acute on chronic respiratory failure with hypoxia and hypercapnia (HCC) [S56.81, J96.22]   Acute Kidney Injury on chronic kidney disease stage IIIa with baseline creatinine 1.35 and GFR of 54 on 07/16/22.  Acute kidney injury appears multifactorial due to hypotension and hemodynamic instability. Renal ultrasound negative for obstruction.    Creatinine slowly improving.  Will stop IV fluids for now and patient encouraged to maintain oral intake.  Continue to avoid nephrotoxic agents and therapies for now.  Lab Results  Component Value Date   CREATININE 2.19  (H) 08/05/2022   CREATININE 2.23 (H) 08/04/2022   CREATININE 2.24 (H) 08/03/2022    Intake/Output Summary (Last 24 hours) at 08/05/2022 1237 Last data filed at 08/05/2022 1100 Gross per 24 hour  Intake 1949.29 ml  Output 1050 ml  Net 899.29 ml    2.  Acute on chronic systolic heart failure.  Echo completed  on 03/06/2022 shows EF 45 to 50% with a mild to moderate MVR, TVR, and AVR.  Cardiology following.   3. Anemia of chronic kidney disease  Normocytic Lab Results  Component Value Date   HGB 11.7 (L) 08/04/2022  Hemoglobin within desired range.  Will monitor for now.  4.  Hypertension with chronic kidney disease. Receiving diltiazem, hydralazine, isosorbide, and metoprolol.  Torsemide held. Receiving midodrine '10mg'$  three times daily.  Blood pressure stable.     LOS: [place] 1/24/202412:37 PM

## 2022-08-05 NOTE — Progress Notes (Signed)
PROGRESS NOTE    Madison Mcbride  WHQ:[id number] DOB: 03-02-1956 DOA: 07/18/2022 PCP: Rusty Aus, MD    Brief Narrative:  67 y.o. female with medical history significant for morbid obesity, chronic diastolic CHF, hypothyroidism, type II DM, obstructive sleep apnea, lymphedema, CKD stage IIIa, OSA, chronic hypoxic and hypercapnic respiratory failure on 2 L/min oxygen and trilogy machine at night.  [facility]ed to [facility] ED [facility]/28/2022 from a skilled nursing facility out of concern for tachycardia, heart rates on admission were in the 130s, Presented with leukocytosis to 12.6K, UA concerning for cystitis. She initially met sepsis criteria, started on IV meropenem for treatment of cystitis due to history of ESBL and IV diltiazem for AF RVR.  Discharge back to SNF was planned to the afternoon of 07/15/2021, but the patient was tachycardic, and the SNF refused to accept her.   Assessment & Plan:   Principal Problem:   Paroxysmal atrial fibrillation with RVR (HCC) Active Problems:   Acute on chronic respiratory failure with hypoxia and hypercapnia (HCC)   Acute metabolic encephalopathy   Acute on chronic systolic CHF (congestive heart failure) (HCC)   HLD (hyperlipidemia)   OSA (obstructive sleep apnea)   Type 2 diabetes mellitus with diabetic neuropathy, without long-term current use of insulin (HCC)   Elevated troponin   Hypothyroidism   Acute respiratory failure with hypoxia (HCC)   AKI (acute kidney injury) ([place])   Thoracic aortic aneurysm (HCC)   Hyperkalemia   Hypotension   Leukocytosis   Normocytic anemia   Acute pulmonary edema (HCC)   HAP (hospital-acquired pneumonia)   Goals of care, counseling/discussion   Delirium   Bilateral pleural effusion  Acute on chronic systolic CHF, pulmonary hypertension: Torsemide on hold.  2D echo showed EF estimated at 25 to 30%, RVSP 50.7 mmHg, BNP 2,302     AKI on CKD stage IIIa, hyperkalemia: Creatinine relatively stable over the last 72  hours.  May have peaked.  On IV dextrose.  Nephrology following.  Avoid nephrotoxins.  Follow-up with nephrologist.     Acute on chronic hypoxic and hypercapnic respiratory failure, COPD, OHS, OSA: Oxygen requirement has gone up.  She was requiring up to 6 L/min oxygen via .  She is also on BiPAP intermittently because of difficulty breathing.  Continue BiPAP at night. She uses 2 L/min oxygen at baseline.  Ideally needs thoracentesis.  Patient still undecided.  Endorses conversational dyspnea.     Bilateral pleural effusion, R>L: Right thoracentesis was ordered on 08/03/2022.  Risks, benefits of thoracentesis were explained to the patient.  Since he has signed consent form but still not 100% decided about procedure.  I have done my best to explain to her the potential benefits outweigh the potential risks.  Ultrasound made aware.  Hopefully she will agree to have this procedure done.     Atrial fibrillation with RVR: Overall, heart rate is better.  She is in mild sinus tachycardia.  Continue amiodarone, Cardizem and metoprolol per cardiologist.  She is on Eliquis for stroke prophylaxis.  Cardiology [facility] follow-up     Hypotension: Continue midodrine     Acute toxic metabolic encephalopathy, delirium: Improved.  Completed 3-day course of Haldol on 08/02/2022.  CT head obtained on 08/01/2022 did not show any acute abnormality.  Mental status appears to be baseline   Recent hypoglycemia, hypokalemia: Resolved     Pneumonia: Completed antibiotics on 07/25/2022.   Other comorbidities include type II DM, restless leg syndrome, hypothyroidism, chronic venous stasis, anxiety, depression,  chronic pain   DVT prophylaxis: Eliquis Code Status: Full Family Communication: Close friend at bedside 1/24 Disposition Plan: Status is: Inpatient Remains inpatient appropriate because: Multiple acute medical issues.  Disposition plan unknown at this time.   Level of care: Telemetry  Cardiac  Consultants:  Cardiology Nephrology Palliative care  Procedures:  None  Antimicrobials: None   Subjective: Seen and A[place].  Endorses significant dyspnea.  Conversational dyspnea.  Diffuse body pains.  Objective: Vitals:   08/05/22 0821 08/05/22 0825 08/05/22 1100 08/05/22 1149  BP:  110/75  112/82  Pulse: 100 100 100 98  Resp: (!) 21 (!) 29 (!) 25 14  Temp:  98.3 F (36.8 C)  (!) 97.4 F (36.3 C)  TempSrc:  Oral  Oral  SpO2: 93% 92% 91% 98%  Weight:      Height:        Intake/Output Summary (Last 24 hours) at 08/05/2022 1431 Last data filed at 08/05/2022 1100 Gross per 24 hour  Intake 1949.29 ml  Output 1050 ml  Net 899.29 ml   Filed Weights   07/23/22 0500 07/28/22 0904 08/01/22 0500  Weight: 135.8 kg (!) 137.5 kg 133.5 kg    Examination:  General exam: Conversational dyspnea noted.  Appears weak and chronically ill Respiratory system: Lung sounds diminished bilaterally.  Mild end expiratory wheezing upper lung fields. Cardiovascular system: S1-S2, tachycardic, no appreciable murmurs, Gastrointestinal system: Obese, soft, NT/ND, normal bowel sounds Central nervous system: Alert and oriented. No focal neurological deficits. Extremities: Symmetrically decreased power bilaterally Skin: No rashes, lesions or ulcers Psychiatry: Judgement and insight appear impaired. Mood & affect flattened.     Data Reviewed: I have personally reviewed following labs and imaging studies  CBC: Recent Labs  Lab 07/31/22 0533 08/01/22 1322 08/02/22 0901 08/03/22 0310 08/04/22 0836  WBC 14.3* 13.9* 11.0* 12.7* 9.9  NEUTROABS  --  10.6* 8.8* 10.5* 7.8*  HGB 12.0 12.6 13.2 10.7* 11.7*  HCT 42.8 43.9 47.8* 37.8 40.9  MCV 91.1 90.0 92.8 88.1 88.5  PLT 363 458* 444* 412* 893*   Basic Metabolic Panel: Recent Labs  Lab 07/30/22 0700 07/31/22 0533 08/01/22 1322 08/02/22 0901 08/03/22 0310 08/04/22 0836 08/05/22 0407  NA 136 140 140 143 141 141 142  K 3.0*  4.2 4.8 5.5* 4.1 4.3 4.0  CL 89* 95* 93* 97* 95* 95* 96*  CO2 38* 34* 30 25 32 32 34*  GLUCOSE 131* 126* 116* 29* 85 98 81  BUN 40* 32* 45* 54* 60* 69* 71*  CREATININE 1.00 0.99 1.63* 2.16* 2.24* 2.23* 2.19*  CALCIUM 8.2* 8.7* 8.8* 9.0 8.1* 8.3* 8.1*  MG 1.8 1.9 2.7*  --   --  2.5*  --   PHOS  --   --   --   --   --  4.5  --    GFR: Estimated Creatinine Clearance: 34 mL/min (A) (by C-G formula based on SCr of 2.19 mg/dL (H)). Liver Function Tests: No results for input(s): "AST", "ALT", "ALKPHOS", "BILITOT", "PROT", "ALBUMIN" in the last 168 hours. No results for input(s): "LIPASE", "AMYLASE" in the last 168 hours. No results for input(s): "AMMONIA" in the last 168 hours. Coagulation Profile: No results for input(s): "INR", "PROTIME" in the last 168 hours. Cardiac Enzymes: No results for input(s): "CKTOTAL", "CKMB", "CKMBINDEX", "TROPONINI" in the last 168 hours. BNP (last 3 results) No results for input(s): "PROBNP" in the last 8760 hours. HbA1C: No results for input(s): "HGBA1C" in the last 72 hours. CBG: Recent Labs  Lab  08/04/22 0729 08/04/22 1653 08/04/22 2123 08/04/22 2351 08/05/22 0402  GLUCAP 81 97 110* 141* 90   Lipid Profile: No results for input(s): "CHOL", "HDL", "LDLCALC", "TRIG", "CHOLHDL", "LDLDIRECT" in the last 72 hours. Thyroid Function Tests: No results for input(s): "TSH", "T4TOTAL", "FREET4", "T3FREE", "THYROIDAB" in the last 72 hours. Anemia Panel: No results for input(s): "VITAMINB12", "FOLATE", "FERRITIN", "TIBC", "IRON", "RETICCTPCT" in the last 72 hours. Sepsis Labs: No results for input(s): "PROCALCITON", "LATICACIDVEN" in the last 168 hours.  No results found for this or any previous visit (from the past 240 hour(s)).       Radiology Studies: No results found.      Scheduled Meds:  amiodarone  200 mg Oral BID   apixaban  5 mg Oral BID   atorvastatin  20 mg Oral Daily   budesonide (PULMICORT) nebulizer solution  0.25 mg  Nebulization BID   buPROPion  150 mg Oral Daily   Chlorhexidine Gluconate Cloth  6 each Topical Daily   diltiazem  180 mg Oral Daily   feeding supplement  237 mL Oral BID BM   hydrocerin   Topical BID   ipratropium-albuterol  3 mL Nebulization BID   isosorbide mononitrate  15 mg Oral BID   levothyroxine  250 mcg Oral Q0600   metoprolol tartrate  50 mg Oral BID   midodrine  10 mg Oral TID WC   pantoprazole  40 mg Oral Daily   rOPINIRole  1 mg Oral QHS   Continuous Infusions:   LOS: 19 days    Sidney Ace, MD [facility]   If 7PM-7AM, please contact night-coverage  08/05/2022, 2:31 PM

## 2022-08-05 NOTE — Progress Notes (Signed)
[facility] Cardiology  Patient ID: Madison Mcbride MRN: [id number] DOB/AGE: 1956-05-03 67 y.o.   Admit date: 06/24/2022 Referring Physician Dr. Doristine Mango Primary Physician Dr. Emily Filbert Primary Cardiologist prev Dr. Nehemiah Massed Reason for Consultation tachycardia   HPI: Madison Mcbride is a 50yoF with a PMH of  HFrEF (prev 45-50% 02/2022), paroxysmal AF on eliquis, OSA on CPAP, HLD, DM2, CKD 3, hypothyroidism, morbid obesity with OHS who presented to [facility] ED 07/05/2022 from a skilled nursing facility out of concern for tachycardia, heart rates on admission were in the 130s, Presented with leukocytosis to 12.6K, UA concerning for cystitis. She initially met sepsis criteria, started on IV meropenem for treatment of cystitis due to history of ESBL and IV diltiazem for AF RVR.  Discharge back to SNF was planned to the afternoon of 07/15/2021, but the patient was tachycardic, and the SNF refused to accept her.  Cardiology was consulted for further assistance with her atrial fibrillation.  She has completed treatment for HAP, hospital course complicated by acute encephalopathy felt to be related to delirium and AKI for which nephrology is following  Interval history: -no acute events, on [place] oxygen - feels short of breath with minimal movement, no chest pain - still waxing and waning decision re thoracentesis - renal function improving with IVF     Vitals:   08/05/22 0404 08/05/22 0718 08/05/22 0821 08/05/22 0825  BP: 97/66   110/75  Pulse: 100  100 100  Resp: (!) 21  (!) 21 (!) 29  Temp: 97.6 F (36.4 C)   98.3 F (36.8 C)  TempSrc: Oral   Oral  SpO2: 100% 98% 93% 92%  Weight:      Height:         Intake/Output Summary (Last 24 hours) at 08/05/2022 0933 Last data filed at 08/04/2022 2307 Gross per 24 hour  Intake 2189.29 ml  Output 550 ml  Net 1639.29 ml    PHYSICAL EXAM General: caucasian female, well nourished, in no acute distress.  Sitting upright in hospital bed on  [place] HEENT:  Normocephalic and atraumatic. Neck:   No JVD.  Lungs: Somewhat short of breath appearing on [place] oxygen, poor inspiratory effort, decreased breath sounds in the bases Heart: Tachycardic but regular . Normal S1 and S2 without gallops or murmurs.  Abdomen: Non-distended appearing with excess adiposity.  Msk: Normal strength and tone for age. Extremities: Chronic appearing venous stasis hyperpigmentation without peripheral edema.  Cap refill brisk bilaterally Neuro: Alert and oriented X 3. Psych:  Answers questions appropriately.    LABS: Basic Metabolic Panel: Recent Labs    08/04/22 0836 08/05/22 0407  NA 141 142  K 4.3 4.0  CL 95* 96*  CO2 32 34*  GLUCOSE 98 81  BUN 69* 71*  CREATININE 2.23* 2.19*  CALCIUM 8.3* 8.1*  MG 2.5*  --   PHOS 4.5  --     Liver Function Tests: No results for input(s): "AST", "ALT", "ALKPHOS", "BILITOT", "PROT", "ALBUMIN" in the last 72 hours. No results for input(s): "LIPASE", "AMYLASE" in the last 72 hours. CBC: Recent Labs    08/03/22 0310 08/04/22 0836  WBC 12.7* 9.9  NEUTROABS 10.5* 7.8*  HGB 10.7* 11.7*  HCT 37.8 40.9  MCV 88.1 88.5  PLT 412* 446*    Cardiac Enzymes: No results for input(s): "CKTOTAL", "CKMB", "CKMBINDEX", "TROPONINI" in the last 72 hours. BNP: Invalid input(s): "POCBNP" D-Dimer: No results for input(s): "DDIMER" in the last 72 hours. Hemoglobin A1C: No results for input(s): "  HGBA1C" in the last 72 hours. Fasting Lipid Panel: No results for input(s): "CHOL", "HDL", "LDLCALC", "TRIG", "CHOLHDL", "LDLDIRECT" in the last 72 hours. Thyroid Function Tests: No results for input(s): "TSH", "T4TOTAL", "T3FREE", "THYROIDAB" in the last 72 hours.  Invalid input(s): "FREET3" Anemia Panel: No results for input(s): "VITAMINB12", "FOLATE", "FERRITIN", "TIBC", "IRON", "RETICCTPCT" in the last 72 hours.  DG Chest Port 1 View  Result Date: 08/03/2022 CLINICAL DATA:  Dyspnea EXAM: PORTABLE CHEST 1 VIEW COMPARISON:   Radiograph 07/27/2022 FINDINGS: Unchanged enlarged cardiac silhouette and pulmonary vascular congestion. There are bilateral pleural effusions and lower lung airspace opacities, similar to prior exam. No evidence of pneumothorax. Bones are unchanged. IMPRESSION: Unchanged cardiomegaly and pulmonary vascular congestion with bilateral pleural effusions and lower lung airspace disease, not significantly changed from prior exam Electronically Signed   By: Maurine Simmering M.D.   On: 08/03/2022 12:50     Echo EF 25-30% 03/06/2022  TELEMETRY: period atrial fibrillation overnight with rate in the 60s, activity sinus rhythm versus rate 90s-100s  ASSESSMENT AND PLAN:  Principal Problem:   Paroxysmal atrial fibrillation with RVR (HCC) Active Problems:   Type 2 diabetes mellitus with diabetic neuropathy, without long-term current use of insulin (HCC)   Hypothyroidism   Acute metabolic encephalopathy   HLD (hyperlipidemia)   Elevated troponin   OSA (obstructive sleep apnea)   Acute respiratory failure with hypoxia (HCC)   Acute on chronic systolic CHF (congestive heart failure) (HCC)   Acute on chronic respiratory failure with hypoxia and hypercapnia (HCC)   AKI (acute kidney injury) ([place])   Thoracic aortic aneurysm (HCC)   Hyperkalemia   Hypotension   Leukocytosis   Normocytic anemia   Acute pulmonary edema (HCC)   HAP (hospital-acquired pneumonia)   Goals of care, counseling/discussion   Delirium   Bilateral pleural effusion    1.  Atrial flutter with RVR, on Eliquis for stroke prevention, cardizem CD 180 daily, metoprolol tartrate 50 mg 3 times daily, and amiodarone 200 mg twice daily, currently in sinus rhythm rate 90s to sinus tach at 100  2.  Acute on chronic systolic congestive heart failure, HFrEF, EF 25-30%/20 11/2021, on torsemide 20 mg daily and good medical management as tolerated (metoprolol to tartrate, isosorbide mononitrate) 3.  COPD 4.  Chronic kidney disease stage IIIa    Recommendations   1.  Agree with current therapy 2.  holding torsemide 20 mg daily per nephrology, now on continuous dextrose infusion at 6m/hr 3.  Continue Eliquis for stroke prevention 4.  Continue amiodarone 200 mg twice daily 5.  Continue metoprolol tartrate 50 mg twice daily 6.  Continue Cardizem CD 180 mg daily for now during acute illness, would recommend discontinuing after hospital discharge follow up with her reduced EF 7.  Follow-up with Dr. CClayborn Bignessafter discharge  This patient's plan of care was discussed and created with Dr. PSaralyn Pilarand he is in agreement.    LTristan Schroeder PA-C 08/05/2022 9:33 AM

## 2022-08-05 NOTE — Progress Notes (Addendum)
Pt was brought to [place] for diagnostic thoracentesis.  Pt experienced difficulty positioning during scan and stated she was unable to breathe or maintain position for thoracentesis. US exam was ended.  There was a small to moderate pocket of attainable fluid noted to right for thoracentesis prior to ending exam.    Narda Rutherford, AGNP-BC 08/05/2022, 2:50 PM

## 2022-08-05 NOTE — Progress Notes (Signed)
Daily Progress Note   Patient Name: Madison Mcbride       Date: 08/05/2022 DOB: 1956/04/02  Age: 67 y.o. MRN#: [id number] Attending Physician: Sidney Ace, MD Primary Care Physician: Rusty Aus, MD Admit Date: 07/16/2022  Reason for Consultation/Follow-up: Establishing goals of care  Subjective: Notes and labs reviewed.  Into see patient.  Patient has a friend at bedside who she states feels like "an adopted sister".  Patient seems to be oriented at this time.  She states she has signed the consent to do the thoracentesis.  Discussed the intent of a thoracentesis.  Began to ask questions regarding goals of care.  Patient states she remembers talking with me a year ago, and states she did not like the content of our conversation, adding that it was nothing personal, it was dislike of the content.  She states she wants to be here to be with her husband, and her husband wants her to be here with him.  She discusses that this is not how she envisions these years after retirement being spent.  Discussed her diagnoses.  Discussed her change in EF and how this affects the body.  Discussed her home trilogy use.  Discussed how sleep apnea affects heart failure.  Discussed CO2 retention her confusion.  Her friend states at home she becomes confused, and upset not wanting her help.  Began to discuss care moving forward and patient states that she needs the nurse to increase her oxygen as she is feeling short of breath.  Discussed that we would talk further tomorrow after her thoracentesis.  Length of Stay: 19  Current Medications: Scheduled Meds:   amiodarone  200 mg Oral BID   apixaban  5 mg Oral BID   atorvastatin  20 mg Oral Daily   budesonide (PULMICORT) nebulizer solution  0.25 mg  Nebulization BID   buPROPion  150 mg Oral Daily   Chlorhexidine Gluconate Cloth  6 each Topical Daily   diltiazem  180 mg Oral Daily   feeding supplement  237 mL Oral BID BM   hydrocerin   Topical BID   ipratropium-albuterol  3 mL Nebulization BID   isosorbide mononitrate  15 mg Oral BID   levothyroxine  250 mcg Oral Q0600   metoprolol tartrate  50 mg Oral BID   midodrine  10 mg Oral TID WC   pantoprazole  40 mg Oral Daily   rOPINIRole  1 mg Oral QHS    Continuous Infusions:   PRN Meds: acetaminophen, guaiFENesin, levalbuterol, liver oil-zinc oxide, metoprolol tartrate, ondansetron (ZOFRAN) IV, polyethylene glycol, senna-docusate  Physical Exam Pulmonary:     Effort: Pulmonary effort is normal.  Neurological:     Mental Status: She is alert.             Vital Signs: BP 112/82 (BP Location: Right Arm)   Pulse 98   Temp (!) 97.4 F (36.3 C) (Oral)   Resp 14   Ht '5\' 5"'$  (1.651 m)   Wt 133.5 kg   SpO2 98%   BMI 48.98 kg/m  SpO2: SpO2: 98 % O2 Device: O2 Device: Nasal Cannula O2 Flow Rate: O2 Flow Rate (L/min): 4 L/min  Intake/output summary:  Intake/Output Summary (Last 24 hours) at 08/05/2022 1205 Last data filed at 08/05/2022 1100 Gross per 24 hour  Intake 1949.29 ml  Output 1050 ml  Net 899.29 ml   LBM: Last BM Date : 08/03/22 Baseline Weight: Weight: (!) 136.5 kg Most recent weight: Weight: 133.5 kg    Patient Active Problem List   Diagnosis Date Noted   Bilateral pleural effusion 08/03/2022   Delirium 07/31/2022   Goals of care, counseling/discussion 07/28/2022   Acute pulmonary edema ([place]) 07/20/2022   HAP (hospital-acquired pneumonia) 07/20/2022   Chronic atrial fibrillation with RVR ([place]) 08/11/2022   Thoracic aortic aneurysm ([place]) 08/08/2022   Hyperkalemia 08/03/2022   Hypotension 08/09/2022   Leukocytosis 08/06/2022   Normocytic anemia 08/02/2022   Paroxysmal atrial fibrillation with RVR ([place]) 07/16/2022   SIRS (systemic inflammatory response  syndrome) ([place]) 07/15/2022   Tachycardia 07/15/2022   AKI (acute kidney injury) ([place]) 07/15/2022   UTI (urinary tract infection) 07/10/2022   HTN (hypertension) 07/07/2022   Type II diabetes mellitus with renal manifestations ([place]) 06/15/2022   Depression with anxiety 06/13/2022   Iron deficiency anemia 07/08/2022   Sepsis ([place]) 07/07/2022   Myocardial injury 06/20/2022   Atrial fibrillation with RVR ([place]) 06/16/2022   Acute kidney injury ([place]) 05/20/2022   Chronic hypoxic respiratory failure, on home oxygen therapy ([place]) 05/20/2022   Congestive heart failure (CHF) ([place]) 05/20/2022   Microcytic anemia 05/20/2022   Chronic venous stasis dermatitis of both lower extremities 05/20/2022   Pressure ulcer of buttock 05/20/2022   Inflammatory arthritis 05/20/2022   Sinus bradycardia 05/20/2022   Dysuria 05/20/2022   PAF (paroxysmal atrial fibrillation) ([place]) 03/08/2022   Acute on chronic respiratory failure with hypoxia and hypercapnia ([place]) 03/05/2022   Effusion of knee joint, left 03/22/2021   Acute on chronic systolic CHF (congestive heart failure) ([place]) 03/19/2021   Acute on chronic heart failure ([place]) 03/14/2021   Acute respiratory failure with hypoxia ([place]) 03/14/2021   Diabetes mellitus without complication ([place]) 36/46/8032   HLD (hyperlipidemia) 04/09/2020   Depression 04/09/2020   Elevated troponin 04/09/2020   OSA (obstructive sleep apnea) 04/09/2020   Peripheral polyneuropathy 02/29/2020   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   Lumbar radiculopathy, acute    Acute midline low back pain without sciatica    Type 2 diabetes mellitus with diabetic neuropathy, without long-term current use of insulin ([place]) 02/21/2020   Hypothyroidism 02/21/2020   Morbid obesity with BMI of 50.0-59.9, adult ([place]) 02/21/2020   CAP (community acquired pneumonia) 02/21/2020   Bilateral cellulitis of lower leg 02/21/2020   Acute respiratory failure with  hypoxia and hypercapnia ([place])  02/21/2020   Acute renal failure superimposed on stage 3a chronic kidney disease ([place]) 02/40/9735   Acute metabolic encephalopathy 32/99/2426   Chronic kidney disease, stage 3a ([place]) 01/27/2020   Transaminitis 01/27/2020   Lymphedema of both lower extremities 08/21/2016   Benign essential hypertension 05/18/2016    Palliative Care Assessment & Plan     Recommendations/Plan: PMT will continue conversations. Plans for thoracentesis today.  Code Status:    Code Status Orders  (From admission, onward)           Start     Ordered   08/07/2022 2107  Full code  Continuous       Question:  By:  Answer:  Default: patient does not have capacity for decision making, no surrogate or prior directive available   07/28/2022 2107           Code Status History     Date Active Date Inactive Code Status Order ID Comments User Context   06/29/2022 0610 08/06/2022 1503 Full Code [id number]  Christel Mormon, MD ED   05/20/2022 1733 05/29/2022 2115 Full Code [id number]  Jose Persia, MD ED   03/05/2022 2257 03/19/2022 1729 Full Code [id number]  Lang Snow, NP ED   07/15/2021 2331 07/29/2021 2207 Full Code [id number]  Cox, Amy N, DO ED   03/14/2021 2357 03/24/2021 2129 Full Code [id number]  Clarnce Flock, MD ED   04/09/2020 1838 04/19/2020 0100 Full Code [id number]  Ivor Costa, MD Inpatient   02/21/2020 0600 03/01/2020 2155 Full Code [id number]  Athena Masse, MD ED      Advance Directive Documentation    Flowsheet Row Most Recent Value  Type of Advance Directive Healthcare Power of Attorney  Pre-existing out of facility DNR order (yellow form or pink MOST form) --  "MOST" Form in Place? --       Care plan was discussed with nurse  Thank you for allowing the Palliative Medicine Team to assist in the care of this patient.   Asencion Gowda, NP  Please contact Palliative Medicine Team phone at [phone] for questions and concerns.

## 2022-08-05 NOTE — Progress Notes (Signed)
Occupational Therapy Treatment Patient Details Name: Madison Mcbride MRN: [id number] DOB: Nov 21, 1955 Today's Date: 08/05/2022   History of present illness 67 y.o. female with medical history significant of thyroid cancer, hypothyroidism, hyperlipidemia, Dm2, CKD stage 3, Pafib , CHFpEF, OSA on CPAP, was discharged to SNF and found to be tachycardic on intake and sent back to ED.   OT comments  Chart reviewed, nurse in room at start of tx session, clears pt for participation. Pt endorses wanting to wear bipap however spo2 >90% on 4 L via [place] at rest, agreeable to participation in tx session with encouragement. Tx session targeted improving functional activity tolerance in preparation for improved ADL performance. Pt continues to present with significant deconditioning and weakness affecting optimal ADL completion. MAX A required for supine>sit, MAX A +2 for sit>supine with MAX A +2 for boost in bed using bed features. Pt encouraged to sit oob, even with use of patient care lift and she declined on this date reporting SOB. Of note spo2 >88% on 4 L via [place] throughout. Discharge recommendation remains appropriate, OT will continue to follow acutely.    Recommendations for follow up therapy are one component of a multi-disciplinary discharge planning process, led by the attending physician.  Recommendations may be updated based on patient status, additional functional criteria and insurance authorization.    Follow Up Recommendations  Skilled nursing-short term rehab (<3 hours/day)     Assistance Recommended at Discharge Frequent or constant Supervision/Assistance  Patient can return home with the following  Two people to help with walking and/or transfers;A lot of help with bathing/dressing/bathroom;Assistance with cooking/housework;Direct supervision/assist for medications management;Direct supervision/assist for financial management;Assist for transportation;Help with stairs or ramp for  entrance   Equipment Recommendations  Other (comment) (per next venue of care)    Recommendations for Other Services      Precautions / Restrictions Precautions Precautions: Fall Restrictions Weight Bearing Restrictions: No       Mobility Bed Mobility Overal bed mobility: Needs Assistance Bed Mobility: Supine to Sit, Sit to Supine     Supine to sit: Max assist Sit to supine: Max assist, +2 for safety/equipment   General bed mobility comments: MAX A for static sitting balance on edge of bed for approx 5 minutes    Transfers                   General transfer comment: unsafe to attempt on this date     Balance Overall balance assessment: Needs assistance Sitting-balance support: Feet supported, Bilateral upper extremity supported Sitting balance-Leahy Scale: Poor                                     ADL either performed or assessed with clinical judgement   ADL Overall ADL's : Needs assistance/impaired Eating/Feeding: Set up;Sitting                   Lower Body Dressing: Maximal assistance Lower Body Dressing Details (indicate cue type and reason): socks                    Extremity/Trunk Assessment              Vision       Perception     Praxis      Cognition Arousal/Alertness: Awake/alert Behavior During Therapy: WFL for tasks assessed/performed Overall Cognitive Status: No family/caregiver present to determine baseline cognitive  functioning Area of Impairment: Following commands, Awareness, Problem solving                       Following Commands: Follows one step commands with increased time     Problem Solving: Slow processing, Requires verbal cues, Requires tactile cues          Exercises      Shoulder Instructions       General Comments spo2 >88% on 4 L via [place] with mobility, pt continues to endorse SOB, requiesting bipap from RN    Pertinent Vitals/ Pain       Pain  Assessment Pain Assessment: Faces Faces Pain Scale: Hurts little more Pain Location: generalized Pain Descriptors / Indicators: Grimacing Pain Intervention(s): Limited activity within patient's tolerance, Repositioned  Home Living                                          Prior Functioning/Environment              Frequency  Min 2X/week        Progress Toward Goals  OT Goals(current goals can now be found in the care plan section)  Progress towards OT goals: Progressing toward goals     Plan Discharge plan remains appropriate;Frequency remains appropriate    Co-evaluation                 AM-PAC OT "6 Clicks" Daily Activity     Outcome Measure   Help from another person eating meals?: None Help from another person taking care of personal grooming?: A Little Help from another person toileting, which includes using toliet, bedpan, or urinal?: A Lot Help from another person bathing (including washing, rinsing, drying)?: A Lot Help from another person to put on and taking off regular upper body clothing?: A Little Help from another person to put on and taking off regular lower body clothing?: A Lot 6 Click Score: 16    End of Session Equipment Utilized During Treatment: Oxygen  OT Visit Diagnosis: Unsteadiness on feet (R26.81);Muscle weakness (generalized) (M62.81)   Activity Tolerance Patient limited by fatigue   Patient Left in bed;with call bell/phone within reach;with bed alarm set   Nurse Communication Mobility status        Time: 8329-1916 OT Time Calculation (min): 23 min  Charges: OT General Charges $OT Visit: 1 Visit OT Treatments $Therapeutic Activity: 23-37 mins  Shanon Payor, OTD OTR/L  08/05/22, 12:52 PM

## 2022-08-06 ENCOUNTER — Inpatient Hospital Stay: Payer: Medicare Other

## 2022-08-06 DIAGNOSIS — J189 Pneumonia, unspecified organism: Secondary | ICD-10-CM

## 2022-08-06 DIAGNOSIS — J90 Pleural effusion, not elsewhere classified: Secondary | ICD-10-CM

## 2022-08-06 DIAGNOSIS — J810 Acute pulmonary edema: Secondary | ICD-10-CM

## 2022-08-06 DIAGNOSIS — Z7189 Other specified counseling: Secondary | ICD-10-CM | POA: Diagnosis not present

## 2022-08-06 DIAGNOSIS — I480 Paroxysmal atrial fibrillation: Secondary | ICD-10-CM | POA: Diagnosis not present

## 2022-08-06 DIAGNOSIS — I4891 Unspecified atrial fibrillation: Secondary | ICD-10-CM

## 2022-08-06 LAB — BODY FLUID CELL COUNT WITH DIFFERENTIAL
Eos, Fluid: 0 %
Lymphs, Fluid: 37 %
Monocyte-Macrophage-Serous Fluid: 9 % — ABNORMAL LOW (ref 50–90)
Neutrophil Count, Fluid: 54 % — ABNORMAL HIGH (ref 0–25)
Total Nucleated Cell Count, Fluid: 1001 cu mm — ABNORMAL HIGH (ref 0–1000)

## 2022-08-06 LAB — BASIC METABOLIC PANEL
Anion gap: 12 (ref 5–15)
BUN: 74 mg/dL — ABNORMAL HIGH (ref 8–23)
CO2: 31 mmol/L (ref 22–32)
Calcium: 8.4 mg/dL — ABNORMAL LOW (ref 8.9–10.3)
Chloride: 98 mmol/L (ref 98–111)
Creatinine, Ser: 2.26 mg/dL — ABNORMAL HIGH (ref 0.44–1.00)
GFR, Estimated: 23 mL/min — ABNORMAL LOW (ref >60)
Glucose, Bld: 78 mg/dL (ref 70–99)
Potassium: 4.2 mmol/L (ref 3.5–5.1)
Sodium: 141 mmol/L (ref 135–145)

## 2022-08-06 LAB — CBC WITH DIFFERENTIAL/PLATELET
Abs Immature Granulocytes: 0.04 10*3/uL (ref 0.00–0.07)
Basophils Absolute: 0.1 10*3/uL (ref 0.0–0.1)
Basophils Relative: 1 %
Eosinophils Absolute: 0.1 10*3/uL (ref 0.0–0.5)
Eosinophils Relative: 1 %
HCT: 42.3 % (ref 36.0–46.0)
Hemoglobin: 11.9 g/dL — ABNORMAL LOW (ref 12.0–15.0)
Immature Granulocytes: 0 %
Lymphocytes Relative: 9 %
Lymphs Abs: 0.9 10*3/uL (ref 0.7–4.0)
MCH: 25.1 pg — ABNORMAL LOW (ref 26.0–34.0)
MCHC: 28.1 g/dL — ABNORMAL LOW (ref 30.0–36.0)
MCV: 89.1 fL (ref 80.0–100.0)
Monocytes Absolute: 0.8 10*3/uL (ref 0.1–1.0)
Monocytes Relative: 8 %
Neutro Abs: 8.5 10*3/uL — ABNORMAL HIGH (ref 1.7–7.7)
Neutrophils Relative %: 81 %
Platelets: 493 10*3/uL — ABNORMAL HIGH (ref 150–400)
RBC: 4.75 MIL/uL (ref 3.87–5.11)
RDW: 22.7 % — ABNORMAL HIGH (ref 11.5–15.5)
Smear Review: NORMAL
WBC: 10.3 10*3/uL (ref 4.0–10.5)
nRBC: 0 % (ref 0.0–0.2)

## 2022-08-06 LAB — GLUCOSE, CAPILLARY
Glucose-Capillary: 68 mg/dL — ABNORMAL LOW (ref 70–99)
Glucose-Capillary: 77 mg/dL (ref 70–99)
Glucose-Capillary: 112 mg/dL — ABNORMAL HIGH (ref 70–99)
Glucose-Capillary: 84 mg/dL (ref 70–99)

## 2022-08-06 LAB — MAGNESIUM: Magnesium: 2.7 mg/dL — ABNORMAL HIGH (ref 1.7–2.4)

## 2022-08-06 MED ORDER — ALPRAZOLAM 0.25 MG PO TABS
0.2500 mg | ORAL_TABLET | Freq: Three times a day (TID) | ORAL | Status: DC | PRN
  Administered 2022-08-07: 0.25 mg via ORAL
  Filled 2022-08-06: qty 1

## 2022-08-06 MED ORDER — AMIODARONE HCL 200 MG PO TABS
200.0000 mg | ORAL_TABLET | Freq: Every day | ORAL | Status: DC
  Administered 2022-08-08 – 2022-08-12 (×5): 200 mg via ORAL
  Filled 2022-08-06 (×5): qty 1

## 2022-08-06 MED ORDER — LIDOCAINE HCL (PF) 1 % IJ SOLN
5.0000 mL | Freq: Once | INTRAMUSCULAR | Status: AC
  Administered 2022-08-06: 5 mL via INTRADERMAL

## 2022-08-06 MED ORDER — ORAL CARE MOUTH RINSE: 15.0000 mL | OROMUCOSAL | Status: DC | PRN

## 2022-08-06 MED ORDER — LORAZEPAM 2 MG/ML IJ SOLN
0.5000 mg | Freq: Four times a day (QID) | INTRAMUSCULAR | Status: DC | PRN
  Administered 2022-08-06 – 2022-08-12 (×2): 0.5 mg via INTRAVENOUS
  Filled 2022-08-06 (×2): qty 1

## 2022-08-06 MED ORDER — ORAL CARE MOUTH RINSE
15.0000 mL | OROMUCOSAL | Status: DC
  Administered 2022-08-06 – 2022-08-12 (×17): 15 mL via OROMUCOSAL

## 2022-08-06 NOTE — Procedures (Signed)
PROCEDURE SUMMARY:  Successful US guided right thoracentesis. Yielded 700 mL of clear amber colored fluid. Pt tolerated procedure well. No immediate complications.  Specimen was sent for labs. CXR ordered.  EBL < 1 mL  Rockney Ghee 08/06/2022 3:03 PM

## 2022-08-06 NOTE — Progress Notes (Signed)
PT Cancellation Note  Patient Details Name: Madison Mcbride MRN: [id number] DOB: Dec 29, 1955   Cancelled Treatment:     Pt recently received Thoracentesis Procedure, unable to tolerate activity at this time. Will try to re-attempt next available date/time per POC.   Josie Dixon 08/06/2022, 5:07 PM

## 2022-08-06 NOTE — Progress Notes (Signed)
Daily Progress Note   Patient Name: Madison Mcbride       Date: 08/06/2022 DOB: 1956-01-14  Age: 67 y.o. MRN#: [id number] Attending Physician: Sidney Ace, MD Primary Care Physician: Rusty Aus, MD Admit Date: 08/10/2022  Reason for Consultation/Follow-up: Establishing goals of care  Subjective: Notes and labs reviewed.  Thoracentesis was unable to be completed yesterday.  Into see patient.  Her friend is at bedside.  Staff is working to get her BiPAP mask back on as she has eaten a bite or 2 of her breakfast and is short of breath.  Patient and friend state nurse is working to find out if patient will be able to have another attempt at thoracentesis today.  Spoke with patient and friend regarding seeing if the procedure could be completed, if it could be completed, how she responds symptomatically, and then if there is not improvement in her status needing to meet with she and her husband regarding care moving forward.  Also discussed meeting with them if they are unable to complete the procedure.   Nursing in to inquire if patient is still giving consent for the procedure.  Patient states yesterday, she was taken down on oxygen by nasal cannula and by the time they were ready to start she was feeling very short of breath and wanted her BiPAP back.  Discussed with nurse finding out if she could be taken down on BiPAP, switch to nasal cannula for the procedure itself if needed, and then being placed back on BiPAP as soon as it was completed.  She states she will check on this.  Husband called to check on patient while I was with her and I spoke to husband [place] on phone.  Recapped the above conversation.  Checked in with nursing who states the requests can be accommodated and the  procedure is scheduled for 2:00 today.  [name] states he will be here before 2:00 and to let the patient know this.  Discussed following up, and he states that he would like to speak further tomorrow morning as he needs a chance to process things today.  Also, and states that the friend is there while he is at work and unable to be there.  He states he is fine with the friend getting updates and having conversations, but he  would also like to be called directly with updates and information.  Discussed that I would follow-up tomorrow.   Length of Stay: 20  Current Medications: Scheduled Meds:   amiodarone  200 mg Oral BID   apixaban  5 mg Oral BID   atorvastatin  20 mg Oral Daily   budesonide (PULMICORT) nebulizer solution  0.25 mg Nebulization BID   buPROPion  150 mg Oral Daily   Chlorhexidine Gluconate Cloth  6 each Topical Daily   diltiazem  180 mg Oral Daily   feeding supplement  237 mL Oral BID BM   hydrocerin   Topical BID   ipratropium-albuterol  3 mL Nebulization BID   isosorbide mononitrate  15 mg Oral BID   levothyroxine  250 mcg Oral Q0600   metoprolol tartrate  50 mg Oral BID   midodrine  10 mg Oral TID WC   mouth rinse  15 mL Mouth Rinse 4 times per day   pantoprazole  40 mg Oral Daily   rOPINIRole  1 mg Oral QHS    Continuous Infusions:   PRN Meds: acetaminophen, ALPRAZolam, guaiFENesin, levalbuterol, liver oil-zinc oxide, LORazepam, metoprolol tartrate, ondansetron (ZOFRAN) IV, mouth rinse, polyethylene glycol, senna-docusate  Physical Exam Pulmonary:     Comments: BiPAP in place Neurological:     Mental Status: She is alert.             Vital Signs: BP 109/78 (BP Location: Right Arm)   Pulse 96   Temp 97.6 F (36.4 C) (Axillary)   Resp (!) 23   Ht '5\' 5"'$  (1.651 m)   Wt 133.5 kg   SpO2 91%   BMI 48.98 kg/m  SpO2: SpO2: 91 % O2 Device: O2 Device: Bi-PAP O2 Flow Rate: O2 Flow Rate (L/min): 5 L/min  Intake/output summary:  Intake/Output Summary (Last 24  hours) at 08/06/2022 1251 Last data filed at 08/06/2022 0933 Gross per 24 hour  Intake 120 ml  Output --  Net 120 ml   LBM: Last BM Date : 08/03/22 Baseline Weight: Weight: (!) 136.5 kg Most recent weight: Weight: 133.5 kg    Patient Active Problem List   Diagnosis Date Noted   Bilateral pleural effusion 08/03/2022   Delirium 07/31/2022   Goals of care, counseling/discussion 07/28/2022   Acute pulmonary edema ([place]) 07/20/2022   HAP (hospital-acquired pneumonia) 07/20/2022   Chronic atrial fibrillation with RVR ([place]) 07/26/2022   Thoracic aortic aneurysm ([place]) 08/07/2022   Hyperkalemia 07/15/2022   Hypotension 08/07/2022   Leukocytosis 08/02/2022   Normocytic anemia 07/21/2022   Paroxysmal atrial fibrillation with RVR ([place]) 07/16/2022   SIRS (systemic inflammatory response syndrome) ([place]) 07/15/2022   Tachycardia 07/15/2022   AKI (acute kidney injury) ([place]) 07/15/2022   UTI (urinary tract infection) 06/13/2022   HTN (hypertension) 06/25/2022   Type II diabetes mellitus with renal manifestations ([place]) 06/16/2022   Depression with anxiety 06/12/2022   Iron deficiency anemia 07/12/2022   Sepsis ([place]) 06/23/2022   Myocardial injury 06/12/2022   Atrial fibrillation with RVR ([place]) 07/08/2022   Acute kidney injury ([place]) 05/20/2022   Chronic hypoxic respiratory failure, on home oxygen therapy ([place]) 05/20/2022   Congestive heart failure (CHF) ([place]) 05/20/2022   Microcytic anemia 05/20/2022   Chronic venous stasis dermatitis of both lower extremities 05/20/2022   Pressure ulcer of buttock 05/20/2022   Inflammatory arthritis 05/20/2022   Sinus bradycardia 05/20/2022   Dysuria 05/20/2022   PAF (paroxysmal atrial fibrillation) ([place]) 03/08/2022   Acute on chronic respiratory failure with hypoxia and hypercapnia (  [place]) 03/05/2022   Effusion of knee joint, left 03/22/2021   Acute on chronic systolic CHF (congestive heart failure) ([place]) 03/19/2021   Acute on chronic heart failure ([place])  03/14/2021   Acute respiratory failure with hypoxia ([place]) 03/14/2021   Diabetes mellitus without complication ([place]) 19/75/8832   HLD (hyperlipidemia) 04/09/2020   Depression 04/09/2020   Elevated troponin 04/09/2020   OSA (obstructive sleep apnea) 04/09/2020   Peripheral polyneuropathy 02/29/2020   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   Lumbar radiculopathy, acute    Acute midline low back pain without sciatica    Type 2 diabetes mellitus with diabetic neuropathy, without long-term current use of insulin ([place]) 02/21/2020   Hypothyroidism 02/21/2020   Morbid obesity with BMI of 50.0-59.9, adult ([place]) 02/21/2020   CAP (community acquired pneumonia) 02/21/2020   Bilateral cellulitis of lower leg 02/21/2020   Acute respiratory failure with hypoxia and hypercapnia ([place]) 02/21/2020   Acute renal failure superimposed on stage 3a chronic kidney disease ([place]) 54/98/2641   Acute metabolic encephalopathy 58/30/9407   Chronic kidney disease, stage 3a ([place]) 01/27/2020   Transaminitis 01/27/2020   Lymphedema of both lower extremities 08/21/2016   Benign essential hypertension 05/18/2016    Palliative Care Assessment & Plan    Recommendations/Plan: Plans for ultrasound today at 2:00.  I will follow-up with family no earlier than tomorrow morning, at husband's request.   Code Status:    Code Status Orders  (From admission, onward)           Start     Ordered   08/07/2022 2107  Full code  Continuous       Question:  By:  Answer:  Default: patient does not have capacity for decision making, no surrogate or prior directive available   08/04/2022 2107           Code Status History     Date Active Date Inactive Code Status Order ID Comments User Context   06/18/2022 0610 08/05/2022 1503 Full Code [id number]  Mansy, Arvella Merles, MD ED   05/20/2022 1733 05/29/2022 2115 Full Code [id number]  Jose Persia, MD ED   03/05/2022 2257 03/19/2022 1729 Full Code  [id number]  Lang Snow, NP ED   07/15/2021 2331 07/29/2021 2207 Full Code [id number]  Cox, Amy N, DO ED   03/14/2021 2357 03/24/2021 2129 Full Code [id number]  Clarnce Flock, MD ED   04/09/2020 1838 04/19/2020 0100 Full Code [id number]  Ivor Costa, MD Inpatient   02/21/2020 0600 03/01/2020 2155 Full Code [id number]  Athena Masse, MD ED      Advance Directive Documentation    Flowsheet Row Most Recent Value  Type of Advance Directive Healthcare Power of Attorney  Pre-existing out of facility DNR order (yellow form or pink MOST form) --  "MOST" Form in Place? --       Prognosis: Poor   Care plan was discussed with nurse  Thank you for allowing the Palliative Medicine Team to assist in the care of this patient.    Asencion Gowda, NP  Please contact Palliative Medicine Team phone at [phone] for questions and concerns.

## 2022-08-06 NOTE — Progress Notes (Signed)
PROGRESS NOTE    Madison Mcbride  DGU:[id number] DOB: 15-Mar-1956 DOA: 07/25/2022 PCP: Rusty Aus, MD    Brief Narrative:  67 y.o. female with medical history significant for morbid obesity, chronic diastolic CHF, hypothyroidism, type II DM, obstructive sleep apnea, lymphedema, CKD stage IIIa, OSA, chronic hypoxic and hypercapnic respiratory failure on 2 L/min oxygen and trilogy machine at night.  Presented to [facility] ED 07/08/2022 from a skilled nursing facility out of concern for tachycardia, heart rates on admission were in the 130s, Presented with leukocytosis to 12.6K, UA concerning for cystitis. She initially met sepsis criteria, started on IV meropenem for treatment of cystitis due to history of ESBL and IV diltiazem for AF RVR.  Discharge back to SNF was planned to the afternoon of 07/15/2021, but the patient was tachycardic, and the SNF refused to accept her.   Respiratory status has been very tenuous during this admission.  She is constantly short of breath.  Conversational dyspnea even with minimal exertion.  Even eating causes her dyspnea worsen.  We attempted thoracentesis on 1/24.  Unfortunately patient was unable to tolerate.  Will reattempt 1/25 with premedication.  Assessment & Plan:   Principal Problem:   Paroxysmal atrial fibrillation with RVR (HCC) Active Problems:   Acute on chronic respiratory failure with hypoxia and hypercapnia (HCC)   Acute metabolic encephalopathy   Acute on chronic systolic CHF (congestive heart failure) (HCC)   HLD (hyperlipidemia)   OSA (obstructive sleep apnea)   Type 2 diabetes mellitus with diabetic neuropathy, without long-term current use of insulin (HCC)   Elevated troponin   Hypothyroidism   Acute respiratory failure with hypoxia (HCC)   AKI (acute kidney injury) ([place])   Thoracic aortic aneurysm (HCC)   Hyperkalemia   Hypotension   Leukocytosis   Normocytic anemia   Acute pulmonary edema (HCC)   HAP (hospital-acquired  pneumonia)   Goals of care, counseling/discussion   Delirium   Bilateral pleural effusion  Acute on chronic systolic CHF, pulmonary hypertension: Torsemide on hold.  2D echo showed EF estimated at 25 to 30%, RVSP 50.7 mmHg, BNP 2,302     AKI on CKD stage IIIa, hyperkalemia: Creatinine relatively stable over the last 72 hours.  May have peaked.  No further IV fluids.  Avoid nephrotoxins.  Nephrology follow-up.       Acute on chronic hypoxic and hypercapnic respiratory failure, COPD, OHS, OSA: Oxygen requirement has gone up.  She was requiring up to 6 L/min oxygen via [place].  She is also on BiPAP intermittently because of difficulty breathing.  Continue BiPAP at night. She uses 2 L/min oxygen at baseline.  Ideally needs thoracentesis.  Attempt on 1/24 unsuccessful due to patient tolerance.  Will reattempt 1/25   Bilateral pleural effusion, R>L: Right thoracentesis was ordered on 08/03/2022.  Risks, benefits of thoracentesis were explained to the patient.  Since he has signed consent form but still not 100% decided about procedure.  I have done my best to explain to her the potential benefits outweigh the potential risks.  Ultrasound made aware.  Patient signed consent but was unable to tolerate procedure.  Atrial fibrillation with RVR: Overall, heart rate is better.  She is in mild sinus tachycardia.  Continue amiodarone, Cardizem and metoprolol per cardiologist.  She is on Eliquis for stroke prophylaxis.  Cardiology [place] [facility] follow-up     Hypotension: Continue midodrine     Acute toxic metabolic encephalopathy, delirium: Improved.  Completed 3-day course of Haldol on 08/02/2022.  CT  head obtained on 08/01/2022 did not show any acute abnormality.  Mental status appears to be baseline   Recent hypoglycemia, hypokalemia: Resolved     Pneumonia: Completed antibiotics on 07/25/2022.   Other comorbidities include type II DM, restless leg syndrome, hypothyroidism, chronic venous stasis, anxiety,  depression, chronic pain   DVT prophylaxis: Eliquis Code Status: Full Family Communication: Close friend at bedside 1/24 Disposition Plan: Status is: Inpatient Remains inpatient appropriate because: Multiple acute medical issues.  Disposition plan unknown at this time.   Level of care: Telemetry Cardiac  Consultants:  Cardiology Nephrology Palliative care  Procedures:  None  Antimicrobials: None   Subjective: Seen and examined.  Endorses significant dyspnea even with minimal exertion or conversation  Objective: Vitals:   08/06/22 0400 08/06/22 0809 08/06/22 0928 08/06/22 1158  BP: 103/71 132/81  109/78  Pulse: 98 94 95 96  Resp: 17 (!) 27 18 (!) 23  Temp:  97.9 F (36.6 C)  97.6 F (36.4 C)  TempSrc:  Oral  Axillary  SpO2: 95% 97% 91%   Weight:      Height:        Intake/Output Summary (Last 24 hours) at 08/06/2022 1401 Last data filed at 08/06/2022 0933 Gross per 24 hour  Intake 120 ml  Output --  Net 120 ml   Filed Weights   07/23/22 0500 07/28/22 0904 08/01/22 0500  Weight: 135.8 kg (!) 137.5 kg 133.5 kg    Examination:  General exam: Appears winded and fatigued.  Conversational dyspnea noted.  Weak and chronically ill Respiratory system: Lung sounds diminished bilaterally.  Mild end expiratory wheezing Cardiovascular system: S1-S2, tachycardic, no appreciable murmurs, Gastrointestinal system: Obese, soft, NT/ND, normal bowel sounds Central nervous system: Alert and oriented. No focal neurological deficits. Extremities: Symmetrically decreased power bilaterally Skin: No rashes, lesions or ulcers Psychiatry: Judgement and insight appear impaired. Mood & affect flattened.     Data Reviewed: I have personally reviewed following labs and imaging studies  CBC: Recent Labs  Lab 08/01/22 1322 08/02/22 0901 08/03/22 0310 08/04/22 0836 08/06/22 0846  WBC 13.9* 11.0* 12.7* 9.9 10.3  NEUTROABS 10.6* 8.8* 10.5* 7.8* 8.5*  HGB 12.6 13.2 10.7* 11.7*  11.9*  HCT 43.9 47.8* 37.8 40.9 42.3  MCV 90.0 92.8 88.1 88.5 89.1  PLT 458* 444* 412* 446* 284*   Basic Metabolic Panel: Recent Labs  Lab 07/31/22 0533 08/01/22 1322 08/02/22 0901 08/03/22 0310 08/04/22 0836 08/05/22 0407 08/06/22 0846  NA 140 140 143 141 141 142 141  K 4.2 4.8 5.5* 4.1 4.3 4.0 4.2  CL 95* 93* 97* 95* 95* 96* 98  CO2 34* 30 25 32 32 34* 31  GLUCOSE 126* 116* 29* 85 98 81 78  BUN 32* 45* 54* 60* 69* 71* 74*  CREATININE 0.99 1.63* 2.16* 2.24* 2.23* 2.19* 2.26*  CALCIUM 8.7* 8.8* 9.0 8.1* 8.3* 8.1* 8.4*  MG 1.9 2.7*  --   --  2.5*  --  2.7*  PHOS  --   --   --   --  4.5  --   --    GFR: Estimated Creatinine Clearance: 32.9 mL/min (A) (by C-G formula based on SCr of 2.26 mg/dL (H)). Liver Function Tests: No results for input(s): "AST", "ALT", "ALKPHOS", "BILITOT", "PROT", "ALBUMIN" in the last 168 hours. No results for input(s): "LIPASE", "AMYLASE" in the last 168 hours. No results for input(s): "AMMONIA" in the last 168 hours. Coagulation Profile: No results for input(s): "INR", "PROTIME" in the last 168 hours. Cardiac  Enzymes: No results for input(s): "CKTOTAL", "CKMB", "CKMBINDEX", "TROPONINI" in the last 168 hours. BNP (last 3 results) No results for input(s): "PROBNP" in the last 8760 hours. HbA1C: No results for input(s): "HGBA1C" in the last 72 hours. CBG: Recent Labs  Lab 08/05/22 0402 08/05/22 1625 08/06/22 0130 08/06/22 0811 08/06/22 1200  GLUCAP 90 83 68* 77 112*   Lipid Profile: No results for input(s): "CHOL", "HDL", "LDLCALC", "TRIG", "CHOLHDL", "LDLDIRECT" in the last 72 hours. Thyroid Function Tests: No results for input(s): "TSH", "T4TOTAL", "FREET4", "T3FREE", "THYROIDAB" in the last 72 hours. Anemia Panel: No results for input(s): "VITAMINB12", "FOLATE", "FERRITIN", "TIBC", "IRON", "RETICCTPCT" in the last 72 hours. Sepsis Labs: No results for input(s): "PROCALCITON", "LATICACIDVEN" in the last 168 hours.  No results found  for this or any previous visit (from the past 240 hour(s)).       Radiology Studies: No results found.      Scheduled Meds:  amiodarone  200 mg Oral BID   [START ON 08/08/2022] amiodarone  200 mg Oral Daily   apixaban  5 mg Oral BID   atorvastatin  20 mg Oral Daily   budesonide (PULMICORT) nebulizer solution  0.25 mg Nebulization BID   buPROPion  150 mg Oral Daily   Chlorhexidine Gluconate Cloth  6 each Topical Daily   diltiazem  180 mg Oral Daily   feeding supplement  237 mL Oral BID BM   hydrocerin   Topical BID   ipratropium-albuterol  3 mL Nebulization BID   isosorbide mononitrate  15 mg Oral BID   levothyroxine  250 mcg Oral Q0600   metoprolol tartrate  50 mg Oral BID   midodrine  10 mg Oral TID WC   mouth rinse  15 mL Mouth Rinse 4 times per day   pantoprazole  40 mg Oral Daily   rOPINIRole  1 mg Oral QHS   Continuous Infusions:   LOS: 20 days    Sidney Ace, MD [facility]   If 7PM-7AM, please contact night-coverage  08/06/2022, 2:01 PM

## 2022-08-06 NOTE — Progress Notes (Signed)
[facility]  ROUNDING NOTE   Subjective:   Madison Mcbride with a past medical history of chronic diastolic heart failure, morbid obesity, hypothyroidism, diabetes mellitus type 2, obstructive sleep apnea, lymphedema, and chronic kidney disease stage IIIa.  Patient presents to the emergency department with complaints of shortness of breath.  She has been admitted for Acute pulmonary edema (HCC) [J81.0] Atrial fibrillation with RVR (HCC) [I48.91] HCAP (healthcare-associated pneumonia) [J18.9] Acute on chronic respiratory failure with hypoxia and hypercapnia ([place]) [W10.27, J96.22]  Patient is known to our practice and is seen outpatient by Dr. Holley Raring.  Patient was last seen in our office on April 03, 2021.  Patient has been lost to follow-up since that time.    Patient currently laying in bed, no family at bedside Complains of fatigue   Objective:  Vital signs in last 24 hours:  Temp:  [97.6 F (36.4 C)-97.9 F (36.6 C)] 97.6 F (36.4 C) (01/25 1158) Pulse Rate:  [94-99] 96 (01/25 1158) Resp:  [16-27] 23 (01/25 1158) BP: (90-132)/(71-81) 109/78 (01/25 1158) SpO2:  [91 %-100 %] 91 % (01/25 0928) FiO2 (%):  [40 %] 40 % (01/25 0339)  Weight change:  Filed Weights   07/23/22 0500 07/28/22 0904 08/01/22 0500  Weight: 135.8 kg (!) 137.5 kg 133.5 kg    Intake/Output: I/O last 3 completed shifts: In: 190.4 [I.V.:190.4] Out: 500 [Urine:500]   Intake/Output this shift:  Total I/O In: 120 [P.O.:120] Out: -   Physical Exam: General: NAD, ill-appearing  Head: Normocephalic, atraumatic. Moist oral mucosal membranes  Eyes: Anicteric  Lungs:  Diminished in bases, [place] O2  Heart: Regular rate and rhythm  Abdomen:  Soft, nontender, obese  Extremities: No peripheral edema.  Neurologic: Alert, able to move all four extremities  Skin: No lesions  Access: None    Basic Metabolic Panel: Recent Labs  Lab 07/31/22 0533 08/01/22 1322 08/02/22 0901  08/03/22 0310 08/04/22 0836 08/05/22 0407 08/06/22 0846  NA 140 140 143 141 141 142 141  K 4.2 4.8 5.5* 4.1 4.3 4.0 4.2  CL 95* 93* 97* 95* 95* 96* 98  CO2 34* 30 25 32 32 34* 31  GLUCOSE 126* 116* 29* 85 98 81 78  BUN 32* 45* 54* 60* 69* 71* 74*  CREATININE 0.99 1.63* 2.16* 2.24* 2.23* 2.19* 2.26*  CALCIUM 8.7* 8.8* 9.0 8.1* 8.3* 8.1* 8.4*  MG 1.9 2.7*  --   --  2.5*  --  2.7*  PHOS  --   --   --   --  4.5  --   --      Liver Function Tests: No results for input(s): "AST", "ALT", "ALKPHOS", "BILITOT", "PROT", "ALBUMIN" in the last 168 hours.  No results for input(s): "LIPASE", "AMYLASE" in the last 168 hours. No results for input(s): "AMMONIA" in the last 168 hours.  CBC: Recent Labs  Lab 08/01/22 1322 08/02/22 0901 08/03/22 0310 08/04/22 0836 08/06/22 0846  WBC 13.9* 11.0* 12.7* 9.9 10.3  NEUTROABS 10.6* 8.8* 10.5* 7.8* 8.5*  HGB 12.6 13.2 10.7* 11.7* 11.9*  HCT 43.9 47.8* 37.8 40.9 42.3  MCV 90.0 92.8 88.1 88.5 89.1  PLT 458* 444* 412* 446* 493*     Cardiac Enzymes: No results for input(s): "CKTOTAL", "CKMB", "CKMBINDEX", "TROPONINI" in the last 168 hours.  BNP: Invalid input(s): "POCBNP"  CBG: Recent Labs  Lab 08/05/22 0402 08/05/22 1625 08/06/22 0130 08/06/22 0811 08/06/22 1200  GLUCAP 90 83 68* 77 112*     Microbiology: Results for orders  placed or performed during the hospital encounter of 08/04/2022  Blood culture (routine x 2)     Status: None   Collection Time: 08/09/2022 11:01 PM   Specimen: BLOOD  Result Value Ref Range Status   Specimen Description BLOOD BLOOD RIGHT WRIST  Final   Special Requests   Final    BOTTLES DRAWN AEROBIC AND ANAEROBIC Blood Culture results may not be optimal due to an inadequate volume of blood received in culture bottles   Culture   Final    NO GROWTH 5 DAYS Performed at [facility], [address], [place], [place] [address]    Report Status 07/22/2022 FINAL  Final  Blood culture (routine x 2)      Status: None   Collection Time: 07/24/2022 11:01 PM   Specimen: BLOOD  Result Value Ref Range Status   Specimen Description BLOOD BLOOD LEFT HAND  Final   Special Requests IN PEDIATRIC BOTTLE Blood Culture adequate volume  Final   Culture   Final    NO GROWTH 5 DAYS Performed at [facility], [address], [place], [place] [address]    Report Status 07/22/2022 FINAL  Final  MRSA Next Gen by PCR, Nasal     Status: None   Collection Time: 07/18/22 10:11 AM   Specimen: Nasal Mucosa; Nasal Swab  Result Value Ref Range Status   MRSA by PCR Next Gen NOT DETECTED NOT DETECTED Final    Comment: (NOTE) The GeneXpert MRSA Assay (FDA approved for NASAL specimens only), is one component of a comprehensive MRSA colonization surveillance program. It is not intended to diagnose MRSA infection nor to guide or monitor treatment for MRSA infections. Test performance is not FDA approved in patients less than 81 years old. Performed at [facility], [place], [place] [address]   Respiratory (~20 pathogens) panel by PCR     Status: None   Collection Time: 07/18/22 11:07 AM   Specimen: Nasopharyngeal Swab; Respiratory  Result Value Ref Range Status   Adenovirus NOT DETECTED NOT DETECTED Final   Coronavirus 229E NOT DETECTED NOT DETECTED Final    Comment: (NOTE) The Coronavirus on the Respiratory Panel, DOES NOT test for the novel  Coronavirus (2019 nCoV)    Coronavirus HKU1 NOT DETECTED NOT DETECTED Final   Coronavirus NL63 NOT DETECTED NOT DETECTED Final   Coronavirus OC43 NOT DETECTED NOT DETECTED Final   Metapneumovirus NOT DETECTED NOT DETECTED Final   Rhinovirus / Enterovirus NOT DETECTED NOT DETECTED Final   Influenza A NOT DETECTED NOT DETECTED Final   Influenza B NOT DETECTED NOT DETECTED Final   Parainfluenza Virus 1 NOT DETECTED NOT DETECTED Final   Parainfluenza Virus 2 NOT DETECTED NOT DETECTED Final   Parainfluenza Virus 3 NOT DETECTED NOT  DETECTED Final   Parainfluenza Virus 4 NOT DETECTED NOT DETECTED Final   Respiratory Syncytial Virus NOT DETECTED NOT DETECTED Final   Bordetella pertussis NOT DETECTED NOT DETECTED Final   Bordetella Parapertussis NOT DETECTED NOT DETECTED Final   Chlamydophila pneumoniae NOT DETECTED NOT DETECTED Final   Mycoplasma pneumoniae NOT DETECTED NOT DETECTED Final    Comment: Performed at [facility], [place]. [address], [place],  [address]  Urine Culture     Status: None   Collection Time: 07/23/22  6:00 AM   Specimen: Urine, Clean Catch  Result Value Ref Range Status   Specimen Description   Final    URINE, CLEAN CATCH Performed at [facility],  [place], [place] [address]    Special Requests   Final    Normal Performed at [facility], [place] [place] [place], [place] [address]    Culture   Final    NO GROWTH Performed at [facility], [place] [address], [place], [place] [address]    Report Status 07/24/2022 FINAL  Final    Coagulation Studies: No results for input(s): "LABPROT", "INR" in the last 72 hours.  Urinalysis: No results for input(s): "COLORURINE", "LABSPEC", "PHURINE", "GLUCOSEU", "HGBUR", "BILIRUBINUR", "KETONESUR", "PROTEINUR", "UROBILINOGEN", "NITRITE", "LEUKOCYTESUR" in the last 72 hours.  Invalid input(s): "APPERANCEUR"    Imaging: No results found.   Medications:      amiodarone  200 mg Oral BID   apixaban  5 mg Oral BID   atorvastatin  20 mg Oral Daily   budesonide (PULMICORT) nebulizer solution  0.25 mg Nebulization BID   buPROPion  150 mg Oral Daily   Chlorhexidine Gluconate Cloth  6 each Topical Daily   diltiazem  180 mg Oral Daily   feeding supplement  237 mL Oral BID BM   hydrocerin   Topical BID   ipratropium-albuterol  3 mL Nebulization BID   isosorbide mononitrate  15 mg Oral BID   levothyroxine  250 mcg Oral Q0600   metoprolol tartrate  50 mg Oral BID   midodrine  10 mg Oral TID WC    mouth rinse  15 mL Mouth Rinse 4 times per day   pantoprazole  40 mg Oral Daily   rOPINIRole  1 mg Oral QHS   acetaminophen, ALPRAZolam, guaiFENesin, levalbuterol, liver oil-zinc oxide, LORazepam, metoprolol tartrate, ondansetron (ZOFRAN) IV, mouth rinse, polyethylene glycol, senna-docusate  Assessment/ Plan:  Ms. Madison Mcbride is a 67 y.o.  female  past medical history of chronic diastolic heart failure, morbid obesity, hypothyroidism, diabetes mellitus type 2, obstructive sleep apnea, lymphedema, and chronic kidney disease stage IIIa.  Patient presents to the emergency department with complaints of shortness of breath.  She has been admitted for Acute pulmonary edema (HCC) [J81.0] Atrial fibrillation with RVR (HCC) [I48.91] HCAP (healthcare-associated pneumonia) [J18.9] Acute on chronic respiratory failure with hypoxia and hypercapnia (HCC) [M42.68, J96.22]   Acute Kidney Injury on chronic kidney disease stage IIIa with baseline creatinine 1.35 and GFR of 54 on 07/16/22.  Acute kidney injury appears multifactorial due to hypotension and hemodynamic instability. Renal ultrasound negative for obstruction.    Creatinine remains stable. This may represent new baseline for patient. Continue to hold diuretics for now.   Lab Results  Component Value Date   CREATININE 2.26 (H) 08/06/2022   CREATININE 2.19 (H) 08/05/2022   CREATININE 2.23 (H) 08/04/2022    Intake/Output Summary (Last 24 hours) at 08/06/2022 1250 Last data filed at 08/06/2022 0933 Gross per 24 hour  Intake 120 ml  Output --  Net 120 ml    2.  Acute on chronic systolic heart failure.  Echo completed on 03/06/2022 shows EF 45 to 50% with a mild to moderate MVR, TVR, and AVR.  Cardiology following.   3. Anemia of chronic kidney disease  Normocytic Lab Results  Component Value Date   HGB 11.9 (L) 08/06/2022  Hemoglobin within desired range.  Will monitor for now.  4.  Hypertension with chronic kidney disease.  Receiving diltiazem, hydralazine, isosorbide, and metoprolol.  Torsemide held. Receiving midodrine '10mg'$  three times daily.  Blood pressure 109/78.     LOS: [place] 1/25/202412:50 PM

## 2022-08-06 NOTE — Progress Notes (Signed)
[facility] Cardiology  Patient ID: Madison Mcbride MRN: [id number] DOB/AGE: 1955-10-19 67 y.o.   Admit date: 07/01/2022 Referring Physician Dr. Doristine Mango Primary Physician Dr. Emily Filbert Primary Cardiologist prev Dr. Nehemiah Massed Reason for Consultation tachycardia   HPI: Xareni Kelch. Lupi is a 15yoF with a PMH of  HFrEF (prev 45-50% 02/2022), paroxysmal AF on eliquis, OSA on CPAP, HLD, DM2, CKD 3, hypothyroidism, morbid obesity with OHS who presented to [facility] ED 07/12/2022 from a skilled nursing facility out of concern for tachycardia, heart rates on admission were in the 130s, Presented with leukocytosis to 12.6K, UA concerning for cystitis. She initially met sepsis criteria, started on IV meropenem for treatment of cystitis due to history of ESBL and IV diltiazem for AF RVR.  Discharge back to SNF was planned to the afternoon of 07/15/2021, but the patient was tachycardic, and the SNF refused to accept her.  Cardiology was consulted for further assistance with her atrial fibrillation, which is well controlled.  She has completed treatment for HAP, hospital course complicated by acute encephalopathy felt to be related to delirium, AKI for which nephrology is following, and severe deconditioning.  Interval history: -doesn't feel good, can't elaborate on what specifically is wrong -still short of breath, on bipap -s/p IVF, renal function stable.  -couldn't tolerate thora yesterday, plan for another try today with premediction for anxiety + RT present to assist with BIPAP. Ongoing palliative discussions - HR stable in 90s    Vitals:   08/06/22 0339 08/06/22 0400 08/06/22 0809 08/06/22 0928  BP:  103/71 132/81   Pulse: 99 98 94 95  Resp: 16 17 (!) 27 18  Temp: 97.9 F (36.6 C)  97.9 F (36.6 C)   TempSrc: Axillary  Oral   SpO2: 94% 95% 97% 91%  Weight:      Height:         Intake/Output Summary (Last 24 hours) at 08/06/2022 1107 Last data filed at 08/06/2022 0933 Gross per 24 hour   Intake 120 ml  Output --  Net 120 ml    PHYSICAL EXAM General: caucasian female, well nourished, in no acute distress.  Sitting upright in hospital bed on [place] HEENT:  Normocephalic and atraumatic. Neck:   No JVD.  Lungs: Somewhat short of breath appearing on [place] oxygen, poor inspiratory effort, decreased breath sounds in the bases Heart: Tachycardic but regular . Normal S1 and S2 without gallops or murmurs.  Abdomen: Non-distended appearing with excess adiposity.  Msk: Normal strength and tone for age. Extremities: Chronic appearing venous stasis hyperpigmentation without peripheral edema.  Cap refill brisk bilaterally Neuro: Alert and oriented X 3. Psych:  Answers questions appropriately.    LABS: Basic Metabolic Panel: Recent Labs    08/04/22 0836 08/05/22 0407 08/06/22 0846  NA 141 142 141  K 4.3 4.0 4.2  CL 95* 96* 98  CO2 32 34* 31  GLUCOSE 98 81 78  BUN 69* 71* 74*  CREATININE 2.23* 2.19* 2.26*  CALCIUM 8.3* 8.1* 8.4*  MG 2.5*  --  2.7*  PHOS 4.5  --   --     Liver Function Tests: No results for input(s): "AST", "ALT", "ALKPHOS", "BILITOT", "PROT", "ALBUMIN" in the last 72 hours. No results for input(s): "LIPASE", "AMYLASE" in the last 72 hours. CBC: Recent Labs    08/04/22 0836 08/06/22 0846  WBC 9.9 10.3  NEUTROABS 7.8* 8.5*  HGB 11.7* 11.9*  HCT 40.9 42.3  MCV 88.5 89.1  PLT 446* 493*    Cardiac  Enzymes: No results for input(s): "CKTOTAL", "CKMB", "CKMBINDEX", "TROPONINI" in the last 72 hours. BNP: Invalid input(s): "POCBNP" D-Dimer: No results for input(s): "DDIMER" in the last 72 hours. Hemoglobin A1C: No results for input(s): "HGBA1C" in the last 72 hours. Fasting Lipid Panel: No results for input(s): "CHOL", "HDL", "LDLCALC", "TRIG", "CHOLHDL", "LDLDIRECT" in the last 72 hours. Thyroid Function Tests: No results for input(s): "TSH", "T4TOTAL", "T3FREE", "THYROIDAB" in the last 72 hours.  Invalid input(s): "FREET3" Anemia Panel: No  results for input(s): "VITAMINB12", "FOLATE", "FERRITIN", "TIBC", "IRON", "RETICCTPCT" in the last 72 hours.  No results found.   Echo EF 25-30% 03/06/2022  TELEMETRY: period atrial fibrillation overnight with rate in the 60s, activity sinus rhythm versus rate 90s-100s  ASSESSMENT AND PLAN:  Principal Problem:   Paroxysmal atrial fibrillation with RVR (HCC) Active Problems:   Type 2 diabetes mellitus with diabetic neuropathy, without long-term current use of insulin (HCC)   Hypothyroidism   Acute metabolic encephalopathy   HLD (hyperlipidemia)   Elevated troponin   OSA (obstructive sleep apnea)   Acute respiratory failure with hypoxia (HCC)   Acute on chronic systolic CHF (congestive heart failure) (HCC)   Acute on chronic respiratory failure with hypoxia and hypercapnia (HCC)   AKI (acute kidney injury) ([place])   Thoracic aortic aneurysm (HCC)   Hyperkalemia   Hypotension   Leukocytosis   Normocytic anemia   Acute pulmonary edema (HCC)   HAP (hospital-acquired pneumonia)   Goals of care, counseling/discussion   Delirium   Bilateral pleural effusion    1.  Atrial flutter with RVR, on Eliquis for stroke prevention, cardizem CD 180 daily, metoprolol tartrate 50 mg 3 times daily, and amiodarone 200 mg twice daily, currently in sinus rhythm rate 90s to sinus tach at 100  2.  Acute on chronic systolic congestive heart failure, HFrEF, EF 25-30%/20 11/2021, on torsemide 20 mg daily and good medical management as tolerated (metoprolol to tartrate, isosorbide mononitrate) 3.  COPD 4.  Chronic kidney disease stage IIIa 5.  Pleural effusion, plan for repeat attempt at thoracentesis today   Recommendations   1.  Agree with current therapy 2.  holding torsemide 20 mg daily per nephrology, s/p dextrose infusion. Defer further diuretics to nephrology.  3.  Continue Eliquis for stroke prevention 4.  Continue amiodarone 200 mg twice daily x 1 more day, then change to once daily.  5.   Continue metoprolol tartrate 50 mg twice daily 6.  Continue Cardizem CD 180 mg daily for now during acute illness, would recommend discontinuing after hospital discharge follow up with her reduced EF 7.  Agree with ongoing [place] discussions, appreciate palliative care assistance  This patient's plan of care was discussed and created with Dr. Saralyn Pilar and he is in agreement.    Tristan Schroeder, PA-C 08/06/2022 11:07 AM

## 2022-08-07 DIAGNOSIS — I480 Paroxysmal atrial fibrillation: Secondary | ICD-10-CM | POA: Diagnosis not present

## 2022-08-07 DIAGNOSIS — Z7189 Other specified counseling: Secondary | ICD-10-CM | POA: Diagnosis not present

## 2022-08-07 LAB — CBC WITH DIFFERENTIAL/PLATELET
Abs Immature Granulocytes: 0.04 10*3/uL (ref 0.00–0.07)
Basophils Absolute: 0.1 10*3/uL (ref 0.0–0.1)
Basophils Relative: 1 %
Eosinophils Absolute: 0.1 10*3/uL (ref 0.0–0.5)
Eosinophils Relative: 1 %
HCT: 40.8 % (ref 36.0–46.0)
Hemoglobin: 11.7 g/dL — ABNORMAL LOW (ref 12.0–15.0)
Immature Granulocytes: 0 %
Lymphocytes Relative: 11 %
Lymphs Abs: 1 10*3/uL (ref 0.7–4.0)
MCH: 25.3 pg — ABNORMAL LOW (ref 26.0–34.0)
MCHC: 28.7 g/dL — ABNORMAL LOW (ref 30.0–36.0)
MCV: 88.3 fL (ref 80.0–100.0)
Monocytes Absolute: 0.7 10*3/uL (ref 0.1–1.0)
Monocytes Relative: 8 %
Neutro Abs: 7.5 10*3/uL (ref 1.7–7.7)
Neutrophils Relative %: 79 %
Platelets: 468 10*3/uL — ABNORMAL HIGH (ref 150–400)
RBC: 4.62 MIL/uL (ref 3.87–5.11)
RDW: 22.7 % — ABNORMAL HIGH (ref 11.5–15.5)
Smear Review: NORMAL
WBC: 9.4 10*3/uL (ref 4.0–10.5)
nRBC: 0 % (ref 0.0–0.2)

## 2022-08-07 LAB — BASIC METABOLIC PANEL
Anion gap: 14 (ref 5–15)
BUN: 74 mg/dL — ABNORMAL HIGH (ref 8–23)
CO2: 31 mmol/L (ref 22–32)
Calcium: 8.3 mg/dL — ABNORMAL LOW (ref 8.9–10.3)
Chloride: 94 mmol/L — ABNORMAL LOW (ref 98–111)
Creatinine, Ser: 2.26 mg/dL — ABNORMAL HIGH (ref 0.44–1.00)
GFR, Estimated: 23 mL/min — ABNORMAL LOW (ref >60)
Glucose, Bld: 83 mg/dL (ref 70–99)
Potassium: 4 mmol/L (ref 3.5–5.1)
Sodium: 139 mmol/L (ref 135–145)

## 2022-08-07 LAB — GLUCOSE, CAPILLARY
Glucose-Capillary: 103 mg/dL — ABNORMAL HIGH (ref 70–99)
Glucose-Capillary: 84 mg/dL (ref 70–99)
Glucose-Capillary: 137 mg/dL — ABNORMAL HIGH (ref 70–99)

## 2022-08-07 MED ORDER — FUROSEMIDE 10 MG/ML IJ SOLN
20.0000 mg | Freq: Once | INTRAMUSCULAR | Status: AC
  Administered 2022-08-07: 20 mg via INTRAVENOUS
  Filled 2022-08-07: qty 2

## 2022-08-07 NOTE — Progress Notes (Signed)
Physical Therapy Treatment Patient Details Name: Madison Mcbride MRN: [id number] DOB: Apr 16, 1956 Today's Date: 08/07/2022   History of Present Illness 67 y.o. female with medical history significant of thyroid cancer, hypothyroidism, hyperlipidemia, Dm2, CKD stage 3, Pafib , CHFpEF, OSA on CPAP, was discharged to SNF and found to be tachycardic on intake and sent back to ED.    PT Comments    Co-tx with OT to complete safe edge of bed sitting x 12 minutes. Assistance needed to maintain sitting varied between Supervision at times and Min/ModA to return to upright sitting due to c/o fatigue. Pt maintained SpO2 on 4L at 90% while sitting, after quickly lying pt down, boosting up in bed, and returning to upright sitting, pt's O2 sats decreased to 80%. O2 bumped up to 6L, pt remained at 84-86% for 12 minutes. Nursing called in and pt assisted back on BiPap, sats at 88%.  Will continue to progress as pt can tolerate during acute stay.    Recommendations for follow up therapy are one component of a multi-disciplinary discharge planning process, led by the attending physician.  Recommendations may be updated based on patient status, additional functional criteria and insurance authorization.  Follow Up Recommendations  Skilled nursing-short term rehab (<3 hours/day) Can patient physically be transported by private vehicle: No   Assistance Recommended at Discharge Frequent or constant Supervision/Assistance  Patient can return home with the following Two people to help with walking and/or transfers;Help with stairs or ramp for entrance;Assist for transportation;Assistance with cooking/housework;Direct supervision/assist for medications management;Direct supervision/assist for financial management;Two people to help with bathing/dressing/bathroom   Equipment Recommendations  Wheelchair (measurements PT);Wheelchair cushion (measurements PT);Hospital bed;BSC/3in1;Other (comment)    Recommendations  for Other Services       Precautions / Restrictions Precautions Precautions: Fall Precaution Comments:  (Monitor O2) Restrictions Weight Bearing Restrictions: No     Mobility  Bed Mobility Overal bed mobility: Needs Assistance Bed Mobility: Supine to Sit, Sit to Supine       Sit to supine: Max assist, +2 for physical assistance   General bed mobility comments: MAX A +2    Transfers                   General transfer comment: deferred. Pt requests to return to supine after ~ 10 min sitting EOB.    Ambulation/Gait               General Gait Details: deferred   Stairs             Wheelchair Mobility    Modified Rankin (Stroke Patients Only)       Balance Overall balance assessment: Needs assistance Sitting-balance support: Feet supported, Bilateral upper extremity supported Sitting balance-Leahy Scale: Fair Sitting balance - Comments: Intermittently able to maintain sitting balance at EOB with close supervision. Postural control: Right lateral lean                                  Cognition Arousal/Alertness: Awake/alert Behavior During Therapy: WFL for tasks assessed/performed Overall Cognitive Status: Within Functional Limits for tasks assessed Area of Impairment: Awareness, Problem solving, Safety/judgement, Following commands                       Following Commands: Follows one step commands with increased time Safety/Judgement: Decreased awareness of safety Awareness: Emergent Problem Solving: Slow processing, Requires verbal cues, Requires tactile cues General  Comments: Oriented to self, place, situation.        Exercises Other Exercises Other Exercises: Pt educated on relaxation and PLB technique as well as pulmonary toileting    General Comments General comments (skin integrity, edema, etc.): SpO2 monitored throughout session. Pt on 4L [place] upon arrival and while sitting EOB x 12 minutes with O2 sats  at 90%. Upon assisting pt to supine, boosting up in bed, and returning to upright posture in bed, O2 sats dropped to 80% with increased SOB.      Pertinent Vitals/Pain Pain Assessment Pain Assessment: No/denies pain    Home Living Family/patient expects to be discharged to:: Skilled nursing facility Living Arrangements: Spouse/significant other Available Help at Discharge: Family;Personal care attendant Type of Home: House Home Access: Stairs to enter Entrance Stairs-Rails: Right;Left;Can reach both Entrance Stairs-Number of Steps: 5   Home Layout: One level Home Equipment: Shower seat;Wheelchair - manual;BSC/3in1;Rollator (4 wheels);Grab bars - tub/shower Additional Comments: PRN O2 3L    Prior Function            PT Goals (current goals can now be found in the care plan section) Acute Rehab PT Goals Patient Stated Goal: to go back to rehab    Frequency    Min 2X/week      PT Plan Current plan remains appropriate    Co-evaluation PT/OT/SLP Co-Evaluation/Treatment: Yes Reason for Co-Treatment: Complexity of the patient's impairments (multi-system involvement);For patient/therapist safety PT goals addressed during session: Mobility/safety with mobility;Balance OT goals addressed during session: ADL's and self-care      AM-PAC PT "6 Clicks" Mobility   Outcome Measure  Help needed turning from your back to your side while in a flat bed without using bedrails?: A Lot Help needed moving from lying on your back to sitting on the side of a flat bed without using bedrails?: A Lot Help needed moving to and from a bed to a chair (including a wheelchair)?: Total Help needed standing up from a chair using your arms (e.g., wheelchair or bedside chair)?: Total Help needed to walk in hospital room?: Total Help needed climbing 3-5 steps with a railing? : Total 6 Click Score: 8    End of Session Equipment Utilized During Treatment: Oxygen Activity Tolerance: Patient  tolerated treatment well Patient left: in bed;with call bell/phone within reach;with nursing/sitter in room Nurse Communication: Mobility status (Increased need for O2 and desaturation during session) PT Visit Diagnosis: Unsteadiness on feet (R26.81);Muscle weakness (generalized) (M62.81)     Time: 8502-7741 PT Time Calculation (min) (ACUTE ONLY): 44 min  Charges:  $Therapeutic Activity: 23-37 mins                    Mikel Cella, PTA    Josie Dixon 08/07/2022, 3:51 PM

## 2022-08-07 NOTE — Progress Notes (Addendum)
[facility]  ROUNDING NOTE   Subjective:   Madison Mcbride with a past medical history of chronic diastolic heart failure, morbid obesity, hypothyroidism, diabetes mellitus type 2, obstructive sleep apnea, lymphedema, and chronic kidney disease stage IIIa.  Patient presents to the emergency department with complaints of shortness of breath.  She has been admitted for Acute pulmonary edema (HCC) [J81.0] Atrial fibrillation with RVR (HCC) [I48.91] HCAP (healthcare-associated pneumonia) [J18.9] Acute on chronic respiratory failure with hypoxia and hypercapnia ([place]) [H06.23, J96.22]  Patient is known to our practice and is seen outpatient by Dr. Holley Raring.  Patient was last seen in our office on April 03, 2021.  Patient has been lost to follow-up since that time.    Patient seen sitting up in bed, family friend at bedside Alert and oriented Reports respiratory status has improved since thoracentesis yesterday Reports shortness of breath with exertion Currently on 5 L nasal cannula   Objective:  Vital signs in last 24 hours:  Temp:  [97.7 F (36.5 C)-97.9 F (36.6 C)] 97.7 F (36.5 C) (01/26 7628) Pulse Rate:  [92-97] 94 (01/26 0608) Resp:  [17-23] 18 (01/26 3151) BP: (98-119)/(33-78) 98/66 (01/26 0608) SpO2:  [87 %-96 %] 96 % (01/26 0608) FiO2 (%):  [40 %] 40 % (01/25 2053) Weight:  [134 kg] 134 kg (01/25 1733)  Weight change:  Filed Weights   07/28/22 0904 08/01/22 0500 08/06/22 1733  Weight: (!) 137.5 kg 133.5 kg 134 kg    Intake/Output: I/O last 3 completed shifts: In: 120 [P.O.:120] Out: -    Intake/Output this shift:  No intake/output data recorded.  Physical Exam: General: NAD  Head: Normocephalic, atraumatic. Moist oral mucosal membranes  Eyes: Anicteric  Lungs:  Diminished in bases, [place] O2  Heart: Regular rate and rhythm  Abdomen:  Soft, nontender, obese  Extremities: No peripheral edema.  Neurologic: Alert, able to move all four  extremities  Skin: No lesions  Access: None    Basic Metabolic Panel: Recent Labs  Lab 08/01/22 1322 08/02/22 0901 08/03/22 0310 08/04/22 0836 08/05/22 0407 08/06/22 0846 08/07/22 0753  NA 140   < > 141 141 142 141 139  K 4.8   < > 4.1 4.3 4.0 4.2 4.0  CL 93*   < > 95* 95* 96* 98 94*  CO2 30   < > 32 32 34* 31 31  GLUCOSE 116*   < > 85 98 81 78 83  BUN 45*   < > 60* 69* 71* 74* 74*  CREATININE 1.63*   < > 2.24* 2.23* 2.19* 2.26* 2.26*  CALCIUM 8.8*   < > 8.1* 8.3* 8.1* 8.4* 8.3*  MG 2.7*  --   --  2.5*  --  2.7*  --   PHOS  --   --   --  4.5  --   --   --    < > = values in this interval not displayed.     Liver Function Tests: No results for input(s): "AST", "ALT", "ALKPHOS", "BILITOT", "PROT", "ALBUMIN" in the last 168 hours.  No results for input(s): "LIPASE", "AMYLASE" in the last 168 hours. No results for input(s): "AMMONIA" in the last 168 hours.  CBC: Recent Labs  Lab 08/02/22 0901 08/03/22 0310 08/04/22 0836 08/06/22 0846 08/07/22 0758  WBC 11.0* 12.7* 9.9 10.3 9.4  NEUTROABS 8.8* 10.5* 7.8* 8.5* 7.5  HGB 13.2 10.7* 11.7* 11.9* 11.7*  HCT 47.8* 37.8 40.9 42.3 40.8  MCV 92.8 88.1 88.5 89.1 88.3  PLT 444* 412* 446* 493* 468*     Cardiac Enzymes: No results for input(s): "CKTOTAL", "CKMB", "CKMBINDEX", "TROPONINI" in the last 168 hours.  BNP: Invalid input(s): "POCBNP"  CBG: Recent Labs  Lab 08/06/22 0811 08/06/22 1200 08/06/22 1736 08/07/22 0216 08/07/22 0840  GLUCAP 77 112* 84 103* 84     Microbiology: Results for orders placed or performed during the hospital encounter of 08/01/2022  Blood culture (routine x 2)     Status: None   Collection Time: 08/01/2022 11:01 PM   Specimen: BLOOD  Result Value Ref Range Status   Specimen Description BLOOD BLOOD RIGHT WRIST  Final   Special Requests   Final    BOTTLES DRAWN AEROBIC AND ANAEROBIC Blood Culture results may not be optimal due to an inadequate volume of blood received in culture bottles    Culture   Final    NO GROWTH 5 DAYS Performed at [facility], [address] [place], [place] [address]    Report Status 07/22/2022 FINAL  Final  Blood culture (routine x 2)     Status: None   Collection Time: 07/17/2022 11:01 PM   Specimen: BLOOD  Result Value Ref Range Status   Specimen Description BLOOD BLOOD LEFT HAND  Final   Special Requests IN PEDIATRIC BOTTLE Blood Culture adequate volume  Final   Culture   Final    NO GROWTH 5 DAYS Performed at [facility], [address] [place], [place] [address]    Report Status 07/22/2022 FINAL  Final  MRSA Next Gen by PCR, Nasal     Status: None   Collection Time: 07/18/22 10:11 AM   Specimen: Nasal Mucosa; Nasal Swab  Result Value Ref Range Status   MRSA by PCR Next Gen NOT DETECTED NOT DETECTED Final    Comment: (NOTE) The GeneXpert MRSA Assay (FDA approved for NASAL specimens only), is one component of a comprehensive MRSA colonization surveillance program. It is not intended to diagnose MRSA infection nor to guide or monitor treatment for MRSA infections. Test performance is not FDA approved in patients less than 9 years old. Performed at [facility], [place], [place] [address]   Respiratory (~20 pathogens) panel by PCR     Status: None   Collection Time: 07/18/22 11:07 AM   Specimen: Nasopharyngeal Swab; Respiratory  Result Value Ref Range Status   Adenovirus NOT DETECTED NOT DETECTED Final   Coronavirus 229E NOT DETECTED NOT DETECTED Final    Comment: (NOTE) The Coronavirus on the Respiratory Panel, DOES NOT test for the novel  Coronavirus (2019 nCoV)    Coronavirus HKU1 NOT DETECTED NOT DETECTED Final   Coronavirus NL63 NOT DETECTED NOT DETECTED Final   Coronavirus OC43 NOT DETECTED NOT DETECTED Final   Metapneumovirus NOT DETECTED NOT DETECTED Final   Rhinovirus / Enterovirus NOT DETECTED NOT DETECTED Final   Influenza A NOT DETECTED NOT DETECTED Final   Influenza  B NOT DETECTED NOT DETECTED Final   Parainfluenza Virus 1 NOT DETECTED NOT DETECTED Final   Parainfluenza Virus 2 NOT DETECTED NOT DETECTED Final   Parainfluenza Virus 3 NOT DETECTED NOT DETECTED Final   Parainfluenza Virus 4 NOT DETECTED NOT DETECTED Final   Respiratory Syncytial Virus NOT DETECTED NOT DETECTED Final   Bordetella pertussis NOT DETECTED NOT DETECTED Final   Bordetella Parapertussis NOT DETECTED NOT DETECTED Final   Chlamydophila pneumoniae NOT DETECTED NOT DETECTED Final   Mycoplasma pneumoniae NOT DETECTED NOT DETECTED Final  Comment: Performed at [facility], [place] [address], [place], [place] [address]  Urine Culture     Status: None   Collection Time: 07/23/22  6:00 AM   Specimen: Urine, Clean Catch  Result Value Ref Range Status   Specimen Description   Final    URINE, CLEAN CATCH Performed at [facility], [address], [place], [place] [address]    Special Requests   Final    Normal Performed at [facility], [address], [place], [place] [address]    Culture   Final    NO GROWTH Performed at [facility], [place] [address], [place], [place] [address]    Report Status 07/24/2022 FINAL  Final    Coagulation Studies: No results for input(s): "LABPROT", "INR" in the last 72 hours.  Urinalysis: No results for input(s): "COLORURINE", "LABSPEC", "PHURINE", "GLUCOSEU", "HGBUR", "BILIRUBINUR", "KETONESUR", "PROTEINUR", "UROBILINOGEN", "NITRITE", "LEUKOCYTESUR" in the last 72 hours.  Invalid input(s): "APPERANCEUR"    Imaging: [place] CHEST (PLEURAL EFFUSION)  Result Date: 08/07/2022 CLINICAL DATA:  Right-sided pleural effusion. Please perform chest ultrasound and ultrasound-guided thoracentesis as indicated. EXAM: CHEST ULTRASOUND COMPARISON:  Chest radiograph-08/03/2022; chest CT-08/02/2022 FINDINGS: Sonographic evaluation of the right chest demonstrates a small right-sided pleural effusion, potentially amenable to  ultrasound-guided thoracentesis however patient was unable to tolerate positioning for the thoracentesis and refuses intervention at this time. IMPRESSION: Small right-sided pleural effusion, potentially amenable to ultrasound-guided thoracentesis however patient unable to tolerate positioning and refuses intervention at this time. No thoracentesis attempted. Electronically Signed   By: Sandi Mariscal M.D.   On: 08/07/2022 08:20   US THORACENTESIS ASP PLEURAL SPACE W/IMG GUIDE  Result Date: 08/06/2022 INDICATION: Shortness of breath with respiratory failure and bilateral pleural effusions. EXAM: ULTRASOUND GUIDED RIGHT THORACENTESIS MEDICATIONS: Local 1% lidocaine only. COMPLICATIONS: None immediate. PROCEDURE: An ultrasound guided thoracentesis was thoroughly discussed with the patient and questions answered. The benefits, risks, alternatives and complications were also discussed. The patient understands and wishes to proceed with the procedure. Written consent was obtained. Ultrasound was performed to localize and mark an adequate pocket of fluid in the right chest. The area was then prepped and draped in the normal sterile fashion. 1% Lidocaine was used for local anesthesia. Under ultrasound guidance a 19 gauge, 10cm Yueh catheter was introduced. Thoracentesis was performed. The catheter was removed and a dressing applied. FINDINGS: A total of approximately 700 cc of clear amber colored fluid was removed. Samples were sent to the laboratory as requested by the clinical team. IMPRESSION: Successful ultrasound guided right thoracentesis yielding 700 cc of pleural fluid. This exam was performed by Tsosie Billing PA-C, and was supervised and interpreted by Dr. Dwaine Gale. Electronically Signed   By: Miachel Roux M.D.   On: 08/06/2022 16:14   DG Chest Port 1 View  Result Date: 08/06/2022 CLINICAL DATA:  Status post right thoracentesis. EXAM: PORTABLE CHEST 1 VIEW COMPARISON:  August 03, 2022. FINDINGS: No pneumothorax  is noted status post right thoracentesis. Decreased right pleural effusion is noted. IMPRESSION: No pneumothorax status post right thoracentesis. Electronically Signed   By: Marijo Conception M.D.   On: 08/06/2022 15:20     Medications:      amiodarone  200 mg Oral BID   [START ON 08/08/2022] amiodarone  200 mg Oral Daily   apixaban  5 mg Oral BID   atorvastatin  20 mg Oral Daily   budesonide (PULMICORT) nebulizer solution  0.25 mg Nebulization BID   buPROPion  150 mg Oral Daily   Chlorhexidine Gluconate Cloth  6 each Topical Daily   diltiazem  180 mg Oral Daily   feeding supplement  237 mL Oral BID BM   furosemide  20 mg Intravenous Once   hydrocerin   Topical BID   ipratropium-albuterol  3 mL Nebulization BID   isosorbide mononitrate  15 mg Oral BID   levothyroxine  250 mcg Oral Q0600   metoprolol tartrate  50 mg Oral BID   midodrine  10 mg Oral TID WC   mouth rinse  15 mL Mouth Rinse 4 times per day   pantoprazole  40 mg Oral Daily   rOPINIRole  1 mg Oral QHS   acetaminophen, ALPRAZolam, guaiFENesin, levalbuterol, liver oil-zinc oxide, LORazepam, metoprolol tartrate, ondansetron (ZOFRAN) IV, mouth rinse, polyethylene glycol, senna-docusate  Assessment/ Plan:  Ms. Madison Mcbride is a 67 y.o.  female  past medical history of chronic diastolic heart failure, morbid obesity, hypothyroidism, diabetes mellitus type 2, obstructive sleep apnea, lymphedema, and chronic kidney disease stage IIIa.  Patient presents to the emergency department with complaints of shortness of breath.  She has been admitted for Acute pulmonary edema (HCC) [J81.0] Atrial fibrillation with RVR (HCC) [I48.91] HCAP (healthcare-associated pneumonia) [J18.9] Acute on chronic respiratory failure with hypoxia and hypercapnia (HCC) [C94.49, J96.22]   Acute Kidney Injury on chronic kidney disease stage IIIa with baseline creatinine 1.35 and GFR of 54 on 07/16/22.  Acute kidney injury appears multifactorial due to  hypotension and hemodynamic instability. Renal ultrasound negative for obstruction.    Creatinine remained stable.  Will order low-dose furosemide challenge with oral furosemide 20 mg.  Will continue to monitor renal function.  Lab Results  Component Value Date   CREATININE 2.26 (H) 08/07/2022   CREATININE 2.26 (H) 08/06/2022   CREATININE 2.19 (H) 08/05/2022   No intake or output data in the 24 hours ending 08/07/22 1342  2.  Acute on chronic systolic heart failure.  Echo completed on 03/06/2022 shows EF 45 to 50% with a mild to moderate MVR, TVR, and AVR.  Cardiology continues to follow.  Thoracentesis completed yesterday with 700 mL fluid removal.  3. Anemia of chronic kidney disease  Normocytic Lab Results  Component Value Date   HGB 11.7 (L) 08/07/2022  Hemoglobin acceptable.  Will monitor for now.  4.  Hypertension with chronic kidney disease. Receiving diltiazem, hydralazine, isosorbide, and metoprolol.  Torsemide held. Receiving midodrine '10mg'$  three times daily.  Blood pressure 98/66.     LOS: [place] 1/26/20241:42 PM

## 2022-08-07 NOTE — Progress Notes (Cosign Needed Addendum)
[facility] Cardiology  Patient ID: Madison Mcbride MRN: [id number] DOB/AGE: Sep 24, 1955 67 y.o.   Admit date: 07/07/2022 Referring Physician Dr. Doristine Mango Primary Physician Dr. Emily Filbert Primary Cardiologist prev Dr. Nehemiah Massed Reason for Consultation tachycardia   HPI: [name]. Spadaccini is a 61yoF with a PMH of  HFrEF (prev 45-50% 02/2022), paroxysmal AF on eliquis, OSA on CPAP, HLD, DM2, CKD 3, hypothyroidism, morbid obesity with OHS who presented to [facility] ED 06/13/2022 from a skilled nursing facility out of concern for tachycardia, heart rates on admission were in the 130s, Presented with leukocytosis to 12.6K, UA concerning for cystitis. She initially met sepsis criteria, started on IV meropenem for treatment of cystitis due to history of ESBL and IV diltiazem for AF RVR.  Discharge back to SNF was planned to the afternoon of 07/15/2021, but the patient was tachycardic, and the SNF refused to accept her.  Cardiology was consulted for further assistance with her atrial fibrillation, which is well controlled.  She has completed treatment for HAP, hospital course complicated by acute encephalopathy felt to be related to delirium, AKI for which nephrology is following, and severe deconditioning.  Interval history: - sleeping on my entry to the room, tells me she has had lower abdominal pain all day and doesn't feel good. Still feels short of breath, although much more comfortable appearing. - s/p R thoracentesis yesterday with 759m fluid aspirated - HR stable in 90s    Vitals:   08/06/22 1720 08/06/22 1733 08/06/22 2346 08/07/22 0608  BP: 101/66 101/66 109/78 98/66  Pulse: 92 93 97 94  Resp: (!) 23 (!) '22 17 18  '$ Temp: 97.9 F (36.6 C) 97.8 F (36.6 C) 97.9 F (36.6 C) 97.7 F (36.5 C)  TempSrc: Oral Oral    SpO2: 92% 94% 95% 96%  Weight:  134 kg    Height:         Intake/Output Summary (Last 24 hours) at 08/07/2022 [id number] data filed at 08/06/2022 0933 Gross per 24 hour  Intake  120 ml  Output --  Net 120 ml    PHYSICAL EXAM General: caucasian female, well nourished, in no acute distress.  Sitting upright in hospital bed on [place], sleeping but awakens easily to voice HEENT:  Normocephalic and atraumatic. Neck:   No JVD.  Lungs: Somewhat short of breath appearing on [place] oxygen, poor inspiratory effort, decreased breath sounds in the bases Heart: Tachycardic but regular . Normal S1 and S2 without gallops or murmurs.  Abdomen: Non-distended appearing with excess adiposity.  Msk: deconditioned  Extremities: Chronic appearing venous stasis hyperpigmentation without peripheral edema.  Neuro: Alert and oriented X 3. Psych:  Answers questions appropriately.    LABS: Basic Metabolic Panel: Recent Labs    08/04/22 0836 08/05/22 0407 08/06/22 0846  NA 141 142 141  K 4.3 4.0 4.2  CL 95* 96* 98  CO2 32 34* 31  GLUCOSE 98 81 78  BUN 69* 71* 74*  CREATININE 2.23* 2.19* 2.26*  CALCIUM 8.3* 8.1* 8.4*  MG 2.5*  --  2.7*  PHOS 4.5  --   --     Liver Function Tests: No results for input(s): "AST", "ALT", "ALKPHOS", "BILITOT", "PROT", "ALBUMIN" in the last 72 hours. No results for input(s): "LIPASE", "AMYLASE" in the last 72 hours. CBC: Recent Labs    08/04/22 0836 08/06/22 0846  WBC 9.9 10.3  NEUTROABS 7.8* 8.5*  HGB 11.7* 11.9*  HCT 40.9 42.3  MCV 88.5 89.1  PLT 446* 493*  Cardiac Enzymes: No results for input(s): "CKTOTAL", "CKMB", "CKMBINDEX", "TROPONINI" in the last 72 hours. BNP: Invalid input(s): "POCBNP" D-Dimer: No results for input(s): "DDIMER" in the last 72 hours. Hemoglobin A1C: No results for input(s): "HGBA1C" in the last 72 hours. Fasting Lipid Panel: No results for input(s): "CHOL", "HDL", "LDLCALC", "TRIG", "CHOLHDL", "LDLDIRECT" in the last 72 hours. Thyroid Function Tests: No results for input(s): "TSH", "T4TOTAL", "T3FREE", "THYROIDAB" in the last 72 hours.  Invalid input(s): "FREET3" Anemia Panel: No results for input(s):  "VITAMINB12", "FOLATE", "FERRITIN", "TIBC", "IRON", "RETICCTPCT" in the last 72 hours.  US THORACENTESIS ASP PLEURAL SPACE W/IMG GUIDE  Result Date: 08/06/2022 INDICATION: Shortness of breath with respiratory failure and bilateral pleural effusions. EXAM: ULTRASOUND GUIDED RIGHT THORACENTESIS MEDICATIONS: Local 1% lidocaine only. COMPLICATIONS: None immediate. PROCEDURE: An ultrasound guided thoracentesis was thoroughly discussed with the patient and questions answered. The benefits, risks, alternatives and complications were also discussed. The patient understands and wishes to proceed with the procedure. Written consent was obtained. Ultrasound was performed to localize and mark an adequate pocket of fluid in the right chest. The area was then prepped and draped in the normal sterile fashion. 1% Lidocaine was used for local anesthesia. Under ultrasound guidance a 19 gauge, 10cm Yueh catheter was introduced. Thoracentesis was performed. The catheter was removed and a dressing applied. FINDINGS: A total of approximately 700 cc of clear amber colored fluid was removed. Samples were sent to the laboratory as requested by the clinical team. IMPRESSION: Successful ultrasound guided right thoracentesis yielding 700 cc of pleural fluid. This exam was performed by Tsosie Billing PA-C, and was supervised and interpreted by Dr. Dwaine Gale. Electronically Signed   By: Miachel Roux M.D.   On: 08/06/2022 16:14   DG Chest Port 1 View  Result Date: 08/06/2022 CLINICAL DATA:  Status post right thoracentesis. EXAM: PORTABLE CHEST 1 VIEW COMPARISON:  August 03, 2022. FINDINGS: No pneumothorax is noted status post right thoracentesis. Decreased right pleural effusion is noted. IMPRESSION: No pneumothorax status post right thoracentesis. Electronically Signed   By: Marijo Conception M.D.   On: 08/06/2022 15:20     Echo EF 25-30% 03/06/2022  TELEMETRY: NSR 90s  ASSESSMENT AND PLAN:  Principal Problem:   Paroxysmal atrial  fibrillation with RVR (HCC) Active Problems:   Type 2 diabetes mellitus with diabetic neuropathy, without long-term current use of insulin (HCC)   Hypothyroidism   Acute metabolic encephalopathy   HLD (hyperlipidemia)   Elevated troponin   OSA (obstructive sleep apnea)   Acute respiratory failure with hypoxia (HCC)   Acute on chronic systolic CHF (congestive heart failure) (HCC)   Acute on chronic respiratory failure with hypoxia and hypercapnia (HCC)   AKI (acute kidney injury) ([place])   Thoracic aortic aneurysm (HCC)   Hyperkalemia   Hypotension   Leukocytosis   Normocytic anemia   Acute pulmonary edema (HCC)   HAP (hospital-acquired pneumonia)   Goals of care, counseling/discussion   Delirium   Bilateral pleural effusion    1.  Atrial flutter with RVR, on Eliquis for stroke prevention, cardizem CD 180 daily, metoprolol tartrate 50 mg 3 times daily, and amiodarone 200 mg twice daily, currently in sinus rhythm rate 90s to sinus tach at 100  2.  Acute on chronic systolic congestive heart failure, HFrEF, EF 25-30%/20 11/2021, on torsemide 20 mg daily and good medical management as tolerated (metoprolol to tartrate, isosorbide mononitrate) 3.  COPD 4.  Chronic kidney disease stage IIIa 5.  Pleural effusion, s/p successful thoracentesis  1/25 with 700cc aspirated with clinical improvement 6.  Morbid obesity, severely deconditioned   Recommendations   1.  Agree with current therapy 2.  holding torsemide 20 mg daily per nephrology, s/p dextrose infusion. Defer further diuretics to nephrology.  3.  Continue Eliquis for stroke prevention 4.  Change amio '200mg'$  BID to once daily on 1/27 5.  Continue metoprolol tartrate 50 mg twice daily 6.  Continue Cardizem CD 180 mg daily for now during acute illness, would recommend discontinuing after hospital discharge follow up with her reduced EF 7.  Agree with ongoing [place] discussions, appreciate palliative care assistance 8.  Needs to mobilize    This patient's plan of care was discussed and created with Dr. Saralyn Pilar and he is in agreement.    Tristan Schroeder, PA-C 08/07/2022 8:03 AM

## 2022-08-07 NOTE — Progress Notes (Signed)
Pt was very alert and awake this morning. Visitor/family noted that she looked "much better" this morning. This afternoon, pt more tired and this evening husband notes that pt doesn't look as good or have as much energy as yesterday evening.  Pt has been on and off bipap approx 4x today for short periods. Tolerating bipap well.

## 2022-08-07 NOTE — Progress Notes (Signed)
PROGRESS NOTE    Madison Mcbride  WIO:[id number] DOB: [birth year] DOA: 07/19/2022 PCP: Rusty Aus, MD    Brief Narrative:  67 y.o. female with medical history significant for morbid obesity, chronic diastolic CHF, hypothyroidism, type II DM, obstructive sleep apnea, lymphedema, CKD stage IIIa, OSA, chronic hypoxic and hypercapnic respiratory failure on 2 L/min oxygen and trilogy machine at night.  Presented to [facility] ED 06/13/2022 from a skilled nursing facility out of concern for tachycardia, heart rates on admission were in the 130s, Presented with leukocytosis to 12.6K, UA concerning for cystitis. She initially met sepsis criteria, started on IV meropenem for treatment of cystitis due to history of ESBL and IV diltiazem for AF RVR.  Discharge back to SNF was planned to the afternoon of 07/15/2021, but the patient was tachycardic, and the SNF refused to accept her.   Respiratory status has been very tenuous during this admission.  She is constantly short of breath.  Conversational dyspnea even with minimal exertion.  Even eating causes her dyspnea worsen.  We attempted thoracentesis on 1/24.  Unfortunately patient was unable to tolerate.  After premedication patient was able to tolerate procedure on 1/25.  700 cc fluid removed.  Assessment & Plan:   Principal Problem:   Paroxysmal atrial fibrillation with RVR (HCC) Active Problems:   Acute on chronic respiratory failure with hypoxia and hypercapnia (HCC)   Acute metabolic encephalopathy   Acute on chronic systolic CHF (congestive heart failure) (HCC)   HLD (hyperlipidemia)   OSA (obstructive sleep apnea)   Type 2 diabetes mellitus with diabetic neuropathy, without long-term current use of insulin (HCC)   Elevated troponin   Hypothyroidism   Acute respiratory failure with hypoxia (HCC)   AKI (acute kidney injury) ([place])   Thoracic aortic aneurysm (HCC)   Hyperkalemia   Hypotension   Leukocytosis   Normocytic anemia   Acute  pulmonary edema (HCC)   HAP (hospital-acquired pneumonia)   Goals of care, counseling/discussion   Delirium   Bilateral pleural effusion  Acute on chronic systolic CHF, pulmonary hypertension: Torsemide on hold.  2D echo showed EF estimated at 25 to 30%, RVSP 50.7 mmHg, BNP 2,302     AKI on CKD stage IIIa, hyperkalemia: Creatinine relatively stable over the last 72 hours.  May have peaked.  No further IV fluids.  Avoid nephrotoxins.  Nephrology follow-up.  May benefit from reintroduction of diuretic     Acute on chronic hypoxic and hypercapnic respiratory failure, COPD, OHS, OSA: Oxygen requirement has gone up.  Was requiring maximum of 6 L via nasal cannula.  Postthoracentesis down to 4 L.  BiPAP at night.  Baseline requirement of prior to hospitalization was 2 L.  Consider diuretics as above.  Wean oxygen as tolerated.  Patient needs to mobilize.  Encourage incentive spirometry use.     Bilateral pleural effusion, R>L: Right thoracentesis was ordered on 08/03/2022.  Patient was able to tolerate procedure with premedication on 1/25.  700 cc of fluid removed.  Atrial fibrillation with RVR: Overall, heart rate is better.  She is in mild sinus tachycardia.  Continue amiodarone, Cardizem and metoprolol per cardiologist.  She is on Eliquis for stroke prophylaxis.  Cardiology [place] clinic follow-up     Hypotension: Continue midodrine     Acute toxic metabolic encephalopathy, delirium: Improved.  Completed 3-day course of Haldol on 08/02/2022.  CT head obtained on 08/01/2022 did not show any acute abnormality.  Mental status appears to be baseline   Recent hypoglycemia, hypokalemia:  Resolved     Pneumonia: Completed antibiotics on 07/25/2022.   Other comorbidities include type II DM, restless leg syndrome, hypothyroidism, chronic venous stasis, anxiety, depression, chronic pain   DVT prophylaxis: Eliquis Code Status: Full Family Communication: Close friend at bedside 1/24 Disposition  Plan: Status is: Inpatient Remains inpatient appropriate because: Multiple acute medical issues.  Disposition plan unknown at this time.   Level of care: Telemetry Cardiac  Consultants:  Cardiology Nephrology Palliative care  Procedures:  None  Antimicrobials: None   Subjective: Seen and examined.  Dyspnea improved post-thoracentesis  Objective: Vitals:   08/06/22 1720 08/06/22 1733 08/06/22 2346 08/07/22 0608  BP: 101/66 101/66 109/78 98/66  Pulse: 92 93 97 94  Resp: (!) 23 (!) '22 17 18  '$ Temp: 97.9 F (36.6 C) 97.8 F (36.6 C) 97.9 F (36.6 C) 97.7 F (36.5 C)  TempSrc: Oral Oral    SpO2: 92% 94% 95% 96%  Weight:  134 kg    Height:       No intake or output data in the 24 hours ending 08/07/22 1031  Filed Weights   07/28/22 0904 08/01/22 0500 08/06/22 1733  Weight: (!) 137.5 kg 133.5 kg 134 kg    Examination:  General exam: Appears weak and chronically ill.  Conversational dyspnea improved Respiratory system: Lung sounds diminished bases.  Mild end expiratory wheezing.  Normal work of breathing.  4 L Cardiovascular system: S1-S2, tachycardic, no appreciable murmurs, Gastrointestinal system: Obese, soft, NT/ND, normal bowel sounds Central nervous system: Alert and oriented. No focal neurological deficits. Extremities: Symmetrically decreased power bilaterally Skin: No rashes, lesions or ulcers Psychiatry: Judgement and insight appear impaired. Mood & affect flattened.     Data Reviewed: I have personally reviewed following labs and imaging studies  CBC: Recent Labs  Lab 08/02/22 0901 08/03/22 0310 08/04/22 0836 08/06/22 0846 08/07/22 0758  WBC 11.0* 12.7* 9.9 10.3 9.4  NEUTROABS 8.8* 10.5* 7.8* 8.5* 7.5  HGB 13.2 10.7* 11.7* 11.9* 11.7*  HCT 47.8* 37.8 40.9 42.3 40.8  MCV 92.8 88.1 88.5 89.1 88.3  PLT 444* 412* 446* 493* 329*   Basic Metabolic Panel: Recent Labs  Lab 08/01/22 1322 08/02/22 0901 08/03/22 0310 08/04/22 0836 08/05/22 0407  08/06/22 0846 08/07/22 0753  NA 140   < > 141 141 142 141 139  K 4.8   < > 4.1 4.3 4.0 4.2 4.0  CL 93*   < > 95* 95* 96* 98 94*  CO2 30   < > 32 32 34* 31 31  GLUCOSE 116*   < > 85 98 81 78 83  BUN 45*   < > 60* 69* 71* 74* 74*  CREATININE 1.63*   < > 2.24* 2.23* 2.19* 2.26* 2.26*  CALCIUM 8.8*   < > 8.1* 8.3* 8.1* 8.4* 8.3*  MG 2.7*  --   --  2.5*  --  2.7*  --   PHOS  --   --   --  4.5  --   --   --    < > = values in this interval not displayed.   GFR: Estimated Creatinine Clearance: 33 mL/min (A) (by C-G formula based on SCr of 2.26 mg/dL (H)). Liver Function Tests: No results for input(s): "AST", "ALT", "ALKPHOS", "BILITOT", "PROT", "ALBUMIN" in the last 168 hours. No results for input(s): "LIPASE", "AMYLASE" in the last 168 hours. No results for input(s): "AMMONIA" in the last 168 hours. Coagulation Profile: No results for input(s): "INR", "PROTIME" in the last 168 hours.  Cardiac Enzymes: No results for input(s): "CKTOTAL", "CKMB", "CKMBINDEX", "TROPONINI" in the last 168 hours. BNP (last 3 results) No results for input(s): "PROBNP" in the last 8760 hours. HbA1C: No results for input(s): "HGBA1C" in the last 72 hours. CBG: Recent Labs  Lab 08/06/22 0811 08/06/22 1200 08/06/22 1736 08/07/22 0216 08/07/22 0840  GLUCAP 77 112* 84 103* 84   Lipid Profile: No results for input(s): "CHOL", "HDL", "LDLCALC", "TRIG", "CHOLHDL", "LDLDIRECT" in the last 72 hours. Thyroid Function Tests: No results for input(s): "TSH", "T4TOTAL", "FREET4", "T3FREE", "THYROIDAB" in the last 72 hours. Anemia Panel: No results for input(s): "VITAMINB12", "FOLATE", "FERRITIN", "TIBC", "IRON", "RETICCTPCT" in the last 72 hours. Sepsis Labs: No results for input(s): "PROCALCITON", "LATICACIDVEN" in the last 168 hours.  No results found for this or any previous visit (from the past 240 hour(s)).       Radiology Studies: [place] CHEST (PLEURAL EFFUSION)  Result Date: 08/07/2022 CLINICAL DATA:   Right-sided pleural effusion. Please perform chest ultrasound and ultrasound-guided thoracentesis as indicated. EXAM: CHEST ULTRASOUND COMPARISON:  Chest radiograph-08/03/2022; chest CT-07/27/2022 FINDINGS: Sonographic evaluation of the right chest demonstrates a small right-sided pleural effusion, potentially amenable to ultrasound-guided thoracentesis however patient was unable to tolerate positioning for the thoracentesis and refuses intervention at this time. IMPRESSION: Small right-sided pleural effusion, potentially amenable to ultrasound-guided thoracentesis however patient unable to tolerate positioning and refuses intervention at this time. No thoracentesis attempted. Electronically Signed   By: Sandi Mariscal M.D.   On: 08/07/2022 08:20   US THORACENTESIS ASP PLEURAL SPACE W/IMG GUIDE  Result Date: 08/06/2022 INDICATION: Shortness of breath with respiratory failure and bilateral pleural effusions. EXAM: ULTRASOUND GUIDED RIGHT THORACENTESIS MEDICATIONS: Local 1% lidocaine only. COMPLICATIONS: None immediate. PROCEDURE: An ultrasound guided thoracentesis was thoroughly discussed with the patient and questions answered. The benefits, risks, alternatives and complications were also discussed. The patient understands and wishes to proceed with the procedure. Written consent was obtained. Ultrasound was performed to localize and mark an adequate pocket of fluid in the right chest. The area was then prepped and draped in the normal sterile fashion. 1% Lidocaine was used for local anesthesia. Under ultrasound guidance a 19 gauge, 10cm Yueh catheter was introduced. Thoracentesis was performed. The catheter was removed and a dressing applied. FINDINGS: A total of approximately 700 cc of clear amber colored fluid was removed. Samples were sent to the laboratory as requested by the clinical team. IMPRESSION: Successful ultrasound guided right thoracentesis yielding 700 cc of pleural fluid. This exam was performed by  Tsosie Billing PA-C, and was supervised and interpreted by Dr. Dwaine Gale. Electronically Signed   By: Miachel Roux M.D.   On: 08/06/2022 16:14   DG Chest Port 1 View  Result Date: 08/06/2022 CLINICAL DATA:  Status post right thoracentesis. EXAM: PORTABLE CHEST 1 VIEW COMPARISON:  August 03, 2022. FINDINGS: No pneumothorax is noted status post right thoracentesis. Decreased right pleural effusion is noted. IMPRESSION: No pneumothorax status post right thoracentesis. Electronically Signed   By: Marijo Conception M.D.   On: 08/06/2022 15:20        Scheduled Meds:  amiodarone  200 mg Oral BID   [START ON 08/08/2022] amiodarone  200 mg Oral Daily   apixaban  5 mg Oral BID   atorvastatin  20 mg Oral Daily   budesonide (PULMICORT) nebulizer solution  0.25 mg Nebulization BID   buPROPion  150 mg Oral Daily   Chlorhexidine Gluconate Cloth  6 each Topical Daily   diltiazem  180 mg  Oral Daily   feeding supplement  237 mL Oral BID BM   hydrocerin   Topical BID   ipratropium-albuterol  3 mL Nebulization BID   isosorbide mononitrate  15 mg Oral BID   levothyroxine  250 mcg Oral Q0600   metoprolol tartrate  50 mg Oral BID   midodrine  10 mg Oral TID WC   mouth rinse  15 mL Mouth Rinse 4 times per day   pantoprazole  40 mg Oral Daily   rOPINIRole  1 mg Oral QHS   Continuous Infusions:   LOS: 21 days    Sidney Ace, MD [facility]   If 7PM-7AM, please contact night-coverage  08/07/2022, 10:31 AM

## 2022-08-07 NOTE — Progress Notes (Addendum)
Daily Progress Note   Patient Name: Madison Mcbride       Date: 08/07/2022 DOB: 09-Nov-1955  Age: 67 y.o. MRN#: [id number] Attending Physician: Sidney Ace, MD Primary Care Physician: Rusty Aus, MD Admit Date: 07/14/2022  Reason for Consultation/Follow-up: Establishing goals of care  Subjective: Notes and labs reviewed.  In to see patient.  Her friend is at bedside.  Patient is on a nasal cannula and states she is feeling better since having the thoracentesis.  Friend at bedside states that patient appeared to feel better, and was even joking also after having Ativan yesterday.  We discussed the use of Ativan and discussed Ativan use among other medications in a comfort approach.  Broached goals of care and patient states she will need to speak with her husband [place].  Patient then requested her friend to step out so that we could speak.  She states that she has lost several of her family members and is grateful to have the help of her friend.  She states that there is always bad that comes with good.  She is concerned about the motives of her friend where her husband is concerned, and is concerned about what this may mean overall, and what this may mean in regards to her going home and requiring assistance.  She did not want to discuss this in the presence of her husband or friend, or for them to know that she is concerned about this.  She states she is not sure what she wants to do about care moving forward as she just did not see herself in this place.  She states both her sons and her husband want her to do what ever is necessary to remain here on earth.  Discussed her current status, and pain and suffering.  Discussed acceptable quality of life.  Discussed her continued shortness of  breath with conversing and trying to eat.  Discussed continued aggressive approach, and discussed a comfort approach.  Patient voices understanding of these two routes.  She states she will speak with her husband about this.  Discussed that I would follow-up Monday on my return to service.   Length of Stay: 21  Current Medications: Scheduled Meds:   amiodarone  200 mg Oral BID   [START ON 08/08/2022] amiodarone  200 mg Oral  Daily   apixaban  5 mg Oral BID   atorvastatin  20 mg Oral Daily   budesonide (PULMICORT) nebulizer solution  0.25 mg Nebulization BID   buPROPion  150 mg Oral Daily   Chlorhexidine Gluconate Cloth  6 each Topical Daily   diltiazem  180 mg Oral Daily   feeding supplement  237 mL Oral BID BM   furosemide  20 mg Intravenous Once   hydrocerin   Topical BID   ipratropium-albuterol  3 mL Nebulization BID   isosorbide mononitrate  15 mg Oral BID   levothyroxine  250 mcg Oral Q0600   metoprolol tartrate  50 mg Oral BID   midodrine  10 mg Oral TID WC   mouth rinse  15 mL Mouth Rinse 4 times per day   pantoprazole  40 mg Oral Daily   rOPINIRole  1 mg Oral QHS    Continuous Infusions:   PRN Meds: acetaminophen, ALPRAZolam, guaiFENesin, levalbuterol, liver oil-zinc oxide, LORazepam, metoprolol tartrate, ondansetron (ZOFRAN) IV, mouth rinse, polyethylene glycol, senna-docusate  Physical Exam Pulmonary:     Comments: Oxygen in place.  Dyspnea with conversation. Neurological:     Mental Status: She is alert.             Vital Signs: BP 98/66 (BP Location: Left Arm)   Pulse 94   Temp 97.7 F (36.5 C)   Resp 18   Ht '5\' 5"'$  (1.651 m)   Wt 134 kg   SpO2 96%   BMI 49.17 kg/m  SpO2: SpO2: 96 % O2 Device: O2 Device: Nasal Cannula O2 Flow Rate: O2 Flow Rate (L/min): 4 L/min  Intake/output summary: No intake or output data in the 24 hours ending 08/07/22 1309 LBM: Last BM Date : 08/03/22 Baseline Weight: Weight: (!) 136.5 kg Most recent weight: Weight: 134 kg        Palliative Assessment/Data:      Patient Active Problem List   Diagnosis Date Noted   Bilateral pleural effusion 08/03/2022   Delirium 07/31/2022   Goals of care, counseling/discussion 07/28/2022   Acute pulmonary edema ([place]) 07/20/2022   HAP (hospital-acquired pneumonia) 07/20/2022   Chronic atrial fibrillation with RVR ([place]) 08/11/2022   Thoracic aortic aneurysm ([place]) 08/04/2022   Hyperkalemia 07/25/2022   Hypotension 07/16/2022   Leukocytosis 08/05/2022   Normocytic anemia 07/29/2022   Paroxysmal atrial fibrillation with RVR ([place]) 07/16/2022   SIRS (systemic inflammatory response syndrome) ([place]) 07/15/2022   Tachycardia 07/15/2022   AKI (acute kidney injury) ([place]) 07/15/2022   UTI (urinary tract infection) 07/02/2022   HTN (hypertension) 07/02/2022   Type II diabetes mellitus with renal manifestations ([place]) 07/12/2022   Depression with anxiety 07/07/2022   Iron deficiency anemia 06/27/2022   Sepsis ([place]) 06/18/2022   Myocardial injury 06/26/2022   Atrial fibrillation with RVR ([place]) 06/16/2022   Acute kidney injury ([place]) 05/20/2022   Chronic hypoxic respiratory failure, on home oxygen therapy ([place]) 05/20/2022   Congestive heart failure (CHF) ([place]) 05/20/2022   Microcytic anemia 05/20/2022   Chronic venous stasis dermatitis of both lower extremities 05/20/2022   Pressure ulcer of buttock 05/20/2022   Inflammatory arthritis 05/20/2022   Sinus bradycardia 05/20/2022   Dysuria 05/20/2022   PAF (paroxysmal atrial fibrillation) ([place]) 03/08/2022   Acute on chronic respiratory failure with hypoxia and hypercapnia ([place]) 03/05/2022   Effusion of knee joint, left 03/22/2021   Acute on chronic systolic CHF (congestive heart failure) ([place]) 03/19/2021   Acute on chronic heart failure ([place]) 03/14/2021   Acute respiratory  failure with hypoxia ([place]) 03/14/2021   Diabetes mellitus without complication ([place]) 31/59/4585   HLD (hyperlipidemia) 04/09/2020   Depression 04/09/2020    Elevated troponin 04/09/2020   OSA (obstructive sleep apnea) 04/09/2020   Peripheral polyneuropathy 02/29/2020   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   Lumbar radiculopathy, acute    Acute midline low back pain without sciatica    Type 2 diabetes mellitus with diabetic neuropathy, without long-term current use of insulin ([place]) 02/21/2020   Hypothyroidism 02/21/2020   Morbid obesity with BMI of 50.0-59.9, adult ([place]) 02/21/2020   CAP (community acquired pneumonia) 02/21/2020   Bilateral cellulitis of lower leg 02/21/2020   Acute respiratory failure with hypoxia and hypercapnia ([place]) 02/21/2020   Acute renal failure superimposed on stage 3a chronic kidney disease ([place]) 92/92/4462   Acute metabolic encephalopathy 86/38/1771   Chronic kidney disease, stage 3a ([place]) 01/27/2020   Transaminitis 01/27/2020   Lymphedema of both lower extremities 08/21/2016   Benign essential hypertension 05/18/2016    Palliative Care Assessment & Plan    Recommendations/Plan: Patient states she will speak with her husband regarding aggressive care versus a comfort path.  PMT will follow-up Monday on return to service.  Primary team could consider low-dose Ativan to help with patient's anxiety.  Code Status:    Code Status Orders  (From admission, onward)           Start     Ordered   08/09/2022 2107  Full code  Continuous       Question:  By:  Answer:  Default: patient does not have capacity for decision making, no surrogate or prior directive available   07/24/2022 2107           Code Status History     Date Active Date Inactive Code Status Order ID Comments User Context   06/12/2022 0610 08/09/2022 1503 Full Code [id number]  Mansy, Arvella Merles, MD ED   05/20/2022 1733 05/29/2022 2115 Full Code [id number]  Jose Persia, MD ED   03/05/2022 2257 03/19/2022 1729 Full Code [id number]  Lang Snow, NP ED   07/15/2021 2331 07/29/2021 2207 Full Code [id number]   Cox, Amy N, DO ED   03/14/2021 2357 03/24/2021 2129 Full Code [id number]  Clarnce Flock, MD ED   04/09/2020 1838 04/19/2020 0100 Full Code [id number]  Ivor Costa, MD Inpatient   02/21/2020 0600 03/01/2020 2155 Full Code [id number]  Athena Masse, MD ED      Advance Directive Documentation    Flowsheet Row Most Recent Value  Type of Advance Directive Healthcare Power of Attorney  Pre-existing out of facility DNR order (yellow form or pink MOST form) --  "MOST" Form in Place? --       Prognosis: Poor overall  Thank you for allowing the Palliative Medicine Team to assist in the care of this patient.    Asencion Gowda, NP  Please contact Palliative Medicine Team phone at [phone] for questions and concerns.

## 2022-08-07 NOTE — Evaluation (Signed)
Occupational Therapy Evaluation Patient Details Name: Madison Mcbride MRN: [id number] DOB: 1956-05-05 Today's Date: 08/07/2022   History of Present Illness 67 y.o. female with medical history significant of thyroid cancer, hypothyroidism, hyperlipidemia, Dm2, CKD stage 3, Pafib , CHFpEF, OSA on CPAP, was discharged to SNF and found to be tachycardic on intake and sent back to ED.   Clinical Impression   Madison Mcbride was seen for OT Re-Evaluation this date. OT/PT co-treatment session to maximize pt participation, safety, and functional mobility. Pt agreeable to tx session with some encouragement from PT and friend at bedside. Pt continues to demo limited independence with ADL management 2/2 generalized weakness, decreased activity tolerance, and cardiopulmonary status. She requires +2 MAX A for sup<>sit t/fs, variable assist for sitting balance at EOB but with MAX A for positioning and cues for hand placement is able to sit for brief periods with close supervision to perform drinking. Pt declines further ADL management when offered this date.  SpO2 >88% while sitting EOB on 5L [place]. Once pt returns to supine she is noted to desat to 80% on 5L. She rebounds to 85% with therapeutic rest break and cues for PLB, but requires significant increased time to rebound. RN in room to assist with placing pt back on bipap at end of session. Pt is progressing toward OT goals and continues to benefit from skilled OT services. POC updated to reflect current pt needs. DC recommendation for STR remains appropriate.      Recommendations for follow up therapy are one component of a multi-disciplinary discharge planning process, led by the attending physician.  Recommendations may be updated based on patient status, additional functional criteria and insurance authorization.   Follow Up Recommendations  Skilled nursing-short term rehab (<3 hours/day)     Assistance Recommended at Discharge Frequent or constant  Supervision/Assistance  Patient can return home with the following Two people to help with walking and/or transfers;A lot of help with bathing/dressing/bathroom;Assistance with cooking/housework;Direct supervision/assist for medications management;Direct supervision/assist for financial management;Assist for transportation;Help with stairs or ramp for entrance    Functional Status Assessment  Patient has had a recent decline in their functional status and demonstrates the ability to make significant improvements in function in a reasonable and predictable amount of time.  Equipment Recommendations   (defer)    Recommendations for Other Services       Precautions / Restrictions Precautions Precautions: Fall Restrictions Weight Bearing Restrictions: No      Mobility Bed Mobility Overal bed mobility: Needs Assistance Bed Mobility: Supine to Sit, Sit to Supine       Sit to supine: Max assist, +2 for physical assistance   General bed mobility comments: MAX A +2    Transfers Overall transfer level: Needs assistance                 General transfer comment: deferred. Pt requests to return to supine after ~ 10 min sitting EOB.      Balance Overall balance assessment: Needs assistance Sitting-balance support: Feet supported, Bilateral upper extremity supported Sitting balance-Leahy Scale: Fair Sitting balance - Comments: Intermittently able to maintain sitting balance at EOB with close supervision. Postural control: Right lateral lean (2/2 fatigue)                                 ADL either performed or assessed with clinical judgement   ADL Overall ADL's : Needs assistance/impaired Eating/Feeding: Sitting;Cueing for  safety;Minimal assistance Eating/Feeding Details (indicate cue type and reason): Able to drink from straw cup while sitting EOB. Intermittent MIN A for sitting balance at EOB. Grooming: Minimal assistance;Sitting Grooming Details (indicate  cue type and reason): Intermittent MIN A to maintain sitting balance.             Lower Body Dressing: Maximal assistance Lower Body Dressing Details (indicate cue type and reason): MAX A to don bilat hospital socks at EOB.             Functional mobility during ADLs: +2 for safety/equipment;+2 for physical assistance;Maximal assistance       Vision Baseline Vision/History: 1 Wears glasses Patient Visual Report: No change from baseline       Perception     Praxis      Pertinent Vitals/Pain Pain Assessment Pain Assessment: No/denies pain     Hand Dominance Right   Extremity/Trunk Assessment Upper Extremity Assessment Upper Extremity Assessment: Generalized weakness   Lower Extremity Assessment Lower Extremity Assessment: Generalized weakness   Cervical / Trunk Assessment Cervical / Trunk Assessment: Normal   Communication Communication Communication: No difficulties   Cognition Arousal/Alertness: Awake/alert Behavior During Therapy: WFL for tasks assessed/performed Overall Cognitive Status: Within Functional Limits for tasks assessed Area of Impairment: Awareness, Problem solving, Safety/judgement, Following commands                       Following Commands: Follows one step commands with increased time Safety/Judgement: Decreased awareness of safety Awareness: Emergent Problem Solving: Slow processing, Requires verbal cues, Requires tactile cues General Comments: Oriented to self, place, situation.     General Comments  spO2 >88% while sitting EOB on 5L [place]. Once pt returns to supine she is noted to desat to 80% on 5L. She rebounds to 85% with therapeutic rest break and cues for PLB, but requires significant increased time to rebound. RN in room to assist with placing pt back on bipap at end of session.    Exercises Other Exercises Other Exercises: Pt educated on role of OT in acute setting, energy conservation strategies, and bed mobility  techniques.   Shoulder Instructions      Home Living Family/patient expects to be discharged to:: Skilled nursing facility Living Arrangements: Spouse/significant other Available Help at Discharge: Family;Personal care attendant Type of Home: House Home Access: Stairs to enter [employer] of Steps: 5 Entrance Stairs-Rails: Right;Left;Can reach both Home Layout: One level     Bathroom Shower/Tub: Tub/shower unit;Walk-in shower   Bathroom Toilet: Standard Bathroom Accessibility: Yes   Home Equipment: Shower seat;Wheelchair - manual;BSC/3in1;Rollator (4 wheels);Grab bars - tub/shower   Additional Comments: PRN O2 3L      Prior Functioning/Environment Prior Level of Function : Needs assist             Mobility Comments: assist for trasnfers at [facility], 05/2022 was MOD I ADLs Comments: Per previous OT eval before SNF admission: "pt reports MOD I in ADL, uses a reacher. does not normally wear socks, just slip on shoes. She has a walk in bathtub. " The personal care attendent helps pt with cookijng and cleaning. Unable to lift or clean the pt.        OT Problem List: Decreased strength;Decreased activity tolerance;Impaired balance (sitting and/or standing);Decreased knowledge of use of DME or AE      OT Treatment/Interventions: Self-care/ADL training;Therapeutic exercise;DME and/or AE instruction;Therapeutic activities;Patient/family education    OT Goals(Current goals can be found in the care plan section) Acute  Rehab OT Goals Patient Stated Goal: To feel better OT Goal Formulation: With patient Time For Goal Achievement: 08/21/22 Potential to Achieve Goals: Good  OT Frequency: Min 2X/week    Co-evaluation PT/OT/SLP Co-Evaluation/Treatment: Yes Reason for Co-Treatment: Necessary to address cognition/behavior during functional activity;For patient/therapist safety;To address functional/ADL transfers PT goals addressed during session: Mobility/safety with  mobility OT goals addressed during session: ADL's and self-care      AM-PAC OT "6 Clicks" Daily Activity     Outcome Measure Help from another person eating meals?: None Help from another person taking care of personal grooming?: A Little Help from another person toileting, which includes using toliet, bedpan, or urinal?: A Lot Help from another person bathing (including washing, rinsing, drying)?: A Lot Help from another person to put on and taking off regular upper body clothing?: A Little Help from another person to put on and taking off regular lower body clothing?: A Lot 6 Click Score: 16   End of Session Equipment Utilized During Treatment: Oxygen Nurse Communication: Mobility status  Activity Tolerance: Patient limited by fatigue Patient left: in bed;with call bell/phone within reach;with bed alarm set  OT Visit Diagnosis: Unsteadiness on feet (R26.81);Muscle weakness (generalized) (M62.81)                Time: 1610-9604 OT Time Calculation (min): 32 min Charges:  OT General Charges $OT Visit: 1 Visit OT Evaluation $OT Re-eval: 1 Re-eval OT Treatments $Self Care/Home Management : 8-22 mins  Shara Blazing, M.S., OTR/L 08/07/22, 3:38 PM

## 2022-08-08 ENCOUNTER — Inpatient Hospital Stay: Payer: Medicare Other

## 2022-08-08 DIAGNOSIS — I480 Paroxysmal atrial fibrillation: Secondary | ICD-10-CM | POA: Diagnosis not present

## 2022-08-08 LAB — GLUCOSE, CAPILLARY
Glucose-Capillary: 109 mg/dL — ABNORMAL HIGH (ref 70–99)
Glucose-Capillary: 114 mg/dL — ABNORMAL HIGH (ref 70–99)
Glucose-Capillary: 117 mg/dL — ABNORMAL HIGH (ref 70–99)

## 2022-08-08 LAB — BASIC METABOLIC PANEL
Anion gap: 11 (ref 5–15)
BUN: 72 mg/dL — ABNORMAL HIGH (ref 8–23)
CO2: 34 mmol/L — ABNORMAL HIGH (ref 22–32)
Calcium: 8.2 mg/dL — ABNORMAL LOW (ref 8.9–10.3)
Chloride: 94 mmol/L — ABNORMAL LOW (ref 98–111)
Creatinine, Ser: 2.17 mg/dL — ABNORMAL HIGH (ref 0.44–1.00)
GFR, Estimated: 24 mL/min — ABNORMAL LOW (ref >60)
Glucose, Bld: 88 mg/dL (ref 70–99)
Potassium: 4 mmol/L (ref 3.5–5.1)
Sodium: 139 mmol/L (ref 135–145)

## 2022-08-08 LAB — CBC WITH DIFFERENTIAL/PLATELET
Abs Immature Granulocytes: 0.05 10*3/uL (ref 0.00–0.07)
Basophils Absolute: 0.1 10*3/uL (ref 0.0–0.1)
Basophils Relative: 1 %
Eosinophils Absolute: 0.1 10*3/uL (ref 0.0–0.5)
Eosinophils Relative: 1 %
HCT: 38.6 % (ref 36.0–46.0)
Hemoglobin: 11 g/dL — ABNORMAL LOW (ref 12.0–15.0)
Immature Granulocytes: 1 %
Lymphocytes Relative: 11 %
Lymphs Abs: 1.1 10*3/uL (ref 0.7–4.0)
MCH: 25.2 pg — ABNORMAL LOW (ref 26.0–34.0)
MCHC: 28.5 g/dL — ABNORMAL LOW (ref 30.0–36.0)
MCV: 88.3 fL (ref 80.0–100.0)
Monocytes Absolute: 0.8 10*3/uL (ref 0.1–1.0)
Monocytes Relative: 8 %
Neutro Abs: 8 10*3/uL — ABNORMAL HIGH (ref 1.7–7.7)
Neutrophils Relative %: 78 %
Platelets: 465 10*3/uL — ABNORMAL HIGH (ref 150–400)
RBC: 4.37 MIL/uL (ref 3.87–5.11)
RDW: 22.5 % — ABNORMAL HIGH (ref 11.5–15.5)
WBC: 10.1 10*3/uL (ref 4.0–10.5)
nRBC: 0 % (ref 0.0–0.2)

## 2022-08-08 NOTE — Plan of Care (Signed)
  Problem: Education: Goal: Knowledge of General Education information will improve Description: Including pain rating scale, medication(s)/side effects and non-pharmacologic comfort measures Outcome: Progressing   Problem: Clinical Measurements: Goal: Ability to maintain clinical measurements within normal limits will improve Outcome: Progressing Goal: Will remain free from infection Outcome: Progressing Goal: Diagnostic test results will improve Outcome: Progressing Goal: Respiratory complications will improve Outcome: Progressing Goal: Cardiovascular complication will be avoided Outcome: Progressing   Problem: Activity: Goal: Risk for activity intolerance will decrease Outcome: Not Progressing   Problem: Nutrition: Goal: Adequate nutrition will be maintained Outcome: Progressing   Problem: Coping: Goal: Level of anxiety will decrease Outcome: Progressing  Problem: Safety: Goal: Ability to remain free from injury will improve Outcome: Progressing   Problem: Skin Integrity: Goal: Risk for impaired skin integrity will decrease Outcome: Progressing

## 2022-08-08 NOTE — Progress Notes (Signed)
Mobility Specialist - Progress Note   Pre-mobility: SpO2(95) During mobility: SpO2(88) RR:34 Post-mobility: SPO2(95)     08/08/22 0900  Mobility  Activity Turned to right side;Turned to left side;Turned to back - supine (repositioned in bed)  Level of Assistance +2 (takes two people)  Assistive Device None  Distance Ambulated (ft) 0 ft  Activity Response Tolerated well;Other (Comment) (Pt did not tolerate the attempt to move to chair with the hoyer.)  Mobility Referral Yes  $Mobility charge 1 Mobility   Pt resting in bed on 4L upon entry. Pt was agreeable to be moved to the chair with hoyer lift. Pt turned left and right Mod/MaxA (pt reached over to assist mobility specialist in turning). Pt desat to 89% oxygen and RR increased to 34-35 right before transfer. Mobility deferred at this time. RN notifed. Pt left with needs in reach and bed alarm activated. Laboratory personnel present in room.   Loma Sender Mobility Specialist 08/08/22, 9:36 AM

## 2022-08-08 NOTE — Progress Notes (Signed)
RT to room for pt call to be placed back on BiPAP. Pt sat high 80's. Pt placed on BiPAP, Pt appears to breathing better. Will continue to monitor

## 2022-08-08 NOTE — Progress Notes (Signed)
PROGRESS NOTE    Madison Mcbride  TSV:[id number] DOB: Jul 13, 1956 DOA: 07/21/2022 PCP: Rusty Aus, MD    Brief Narrative:  67 y.o. female with medical history significant for morbid obesity, chronic diastolic CHF, hypothyroidism, type II DM, obstructive sleep apnea, lymphedema, CKD stage IIIa, OSA, chronic hypoxic and hypercapnic respiratory failure on 2 L/min oxygen and trilogy machine at night.  Presented to [facility] ED 07/12/2022 from a skilled nursing facility out of concern for tachycardia, heart rates on admission were in the 130s, Presented with leukocytosis to 12.6K, UA concerning for cystitis. She initially met sepsis criteria, started on IV meropenem for treatment of cystitis due to history of ESBL and IV diltiazem for AF RVR.  Discharge back to SNF was planned to the afternoon of 07/15/2021, but the patient was tachycardic, and the SNF refused to accept her.   Respiratory status has been very tenuous during this admission.  She is constantly short of breath.  Conversational dyspnea even with minimal exertion.  Even eating causes her dyspnea worsen.  We attempted thoracentesis on 1/24.  Unfortunately patient was unable to tolerate.  After premedication patient was able to tolerate procedure on 1/25.  700 cc fluid removed.  Assessment & Plan:   Principal Problem:   Paroxysmal atrial fibrillation with RVR (HCC) Active Problems:   Acute on chronic respiratory failure with hypoxia and hypercapnia (HCC)   Acute metabolic encephalopathy   Acute on chronic systolic CHF (congestive heart failure) (HCC)   HLD (hyperlipidemia)   OSA (obstructive sleep apnea)   Type 2 diabetes mellitus with diabetic neuropathy, without long-term current use of insulin (HCC)   Elevated troponin   Hypothyroidism   Acute respiratory failure with hypoxia (HCC)   AKI (acute kidney injury) ([place])   Thoracic aortic aneurysm (HCC)   Hyperkalemia   Hypotension   Leukocytosis   Normocytic anemia   Acute  pulmonary edema (HCC)   HAP (hospital-acquired pneumonia)   Goals of care, counseling/discussion   Delirium   Bilateral pleural effusion  Acute on chronic systolic CHF, pulmonary hypertension: Status post low-dose Lasix challenge.  20 mg p.o. x 1 given on 1/26.  Creatinine stable.  Patient may benefit from daily low-dose diuretic.  Defer to nephrology.     AKI on CKD stage IIIa, hyperkalemia: Creatinine relatively stable.  May have establish new baseline.  No further IV fluids.  Defer to nephrology regarding repeat Lasix challenge.       Acute on chronic hypoxic and hypercapnic respiratory failure, COPD, OHS, OSA: Oxygen requirement has gone up from baseline.  Was requiring maximum of 6 L via nasal cannula.  Postthoracentesis down to 4 L.  BiPAP at night.  Baseline requirement of prior to hospitalization was 2 L.  Consider diuretic challenge as well.  Wean oxygen as tolerated.  Mobility as tolerated.  Incentive spirometry use.    Bilateral pleural effusion, R>L: Right thoracentesis was ordered on 08/03/2022.  Patient was able to tolerate procedure with premedication on 1/25.  700 cc of fluid removed.  Atrial fibrillation with RVR: Overall, heart rate is better.  She is in mild sinus tachycardia.  Continue amiodarone, Cardizem and metoprolol per cardiologist.  She is on Eliquis for stroke prophylaxis.  Cardiology [place] [facility] follow-up     Hypotension: Continue midodrine     Acute toxic metabolic encephalopathy, delirium: Improved.  Completed 3-day course of Haldol on 08/02/2022.  CT head obtained on 08/01/2022 did not show any acute abnormality.  Mental status appears to be baseline  Recent hypoglycemia, hypokalemia: Resolved     Pneumonia: Completed antibiotics on 07/25/2022.   Other comorbidities include type II DM, restless leg syndrome, hypothyroidism, chronic venous stasis, anxiety, depression, chronic pain   DVT prophylaxis: Eliquis Code Status: Full Family Communication:  Close friend at bedside 1/24 Disposition Plan: Status is: Inpatient Remains inpatient appropriate because: Multiple acute medical issues.  Disposition plan unknown at this time.   Level of care: Telemetry Cardiac  Consultants:  Cardiology Nephrology Palliative care  Procedures:  None  Antimicrobials: None   Subjective: Seen and examined.  Dyspnea improved post-thoracentesis.  Attempting to mobilize with great difficulty.  Objective: Vitals:   08/07/22 2019 08/07/22 2130 08/08/22 0324 08/08/22 0756  BP:   95/64 108/68  Pulse:   83 91  Resp:   18 (!) 24  Temp: 97.7 F (36.5 C)  97.7 F (36.5 C) 97.7 F (36.5 C)  TempSrc: Oral  Oral Oral  SpO2:  97% 97% 98%  Weight:      Height:        Intake/Output Summary (Last 24 hours) at 08/08/2022 1050 Last data filed at 08/08/2022 1000 Gross per 24 hour  Intake --  Output 1055 ml  Net -1055 ml    Filed Weights   07/28/22 0904 08/01/22 0500 08/06/22 1733  Weight: (!) 137.5 kg 133.5 kg 134 kg    Examination:  General exam: Appears fatigued.  Chronically ill Respiratory system: Lung sounds decreased at bases.  No wheeze noted.  Normal work of breathing.  4 L Cardiovascular system: S1-S2, tachycardic, no appreciable murmurs, Gastrointestinal system: Obese, soft, NT/ND, normal bowel sounds Central nervous system: Alert and oriented. No focal neurological deficits. Extremities: Symmetrically decreased power bilaterally Skin: No rashes, lesions or ulcers Psychiatry: Judgement and insight appear impaired. Mood & affect flattened.     Data Reviewed: I have personally reviewed following labs and imaging studies  CBC: Recent Labs  Lab 08/03/22 0310 08/04/22 0836 08/06/22 0846 08/07/22 0758 08/08/22 0900  WBC 12.7* 9.9 10.3 9.4 10.1  NEUTROABS 10.5* 7.8* 8.5* 7.5 8.0*  HGB 10.7* 11.7* 11.9* 11.7* 11.0*  HCT 37.8 40.9 42.3 40.8 38.6  MCV 88.1 88.5 89.1 88.3 88.3  PLT 412* 446* 493* 468* 209*   Basic Metabolic  Panel: Recent Labs  Lab 08/01/22 1322 08/02/22 0901 08/04/22 0836 08/05/22 0407 08/06/22 0846 08/07/22 0753 08/08/22 0900  NA 140   < > 141 142 141 139 139  K 4.8   < > 4.3 4.0 4.2 4.0 4.0  CL 93*   < > 95* 96* 98 94* 94*  CO2 30   < > 32 34* 31 31 34*  GLUCOSE 116*   < > 98 81 78 83 88  BUN 45*   < > 69* 71* 74* 74* 72*  CREATININE 1.63*   < > 2.23* 2.19* 2.26* 2.26* 2.17*  CALCIUM 8.8*   < > 8.3* 8.1* 8.4* 8.3* 8.2*  MG 2.7*  --  2.5*  --  2.7*  --   --   PHOS  --   --  4.5  --   --   --   --    < > = values in this interval not displayed.   GFR: Estimated Creatinine Clearance: 34.4 mL/min (A) (by C-G formula based on SCr of 2.17 mg/dL (H)). Liver Function Tests: No results for input(s): "AST", "ALT", "ALKPHOS", "BILITOT", "PROT", "ALBUMIN" in the last 168 hours. No results for input(s): "LIPASE", "AMYLASE" in the last 168 hours. No results  for input(s): "AMMONIA" in the last 168 hours. Coagulation Profile: No results for input(s): "INR", "PROTIME" in the last 168 hours. Cardiac Enzymes: No results for input(s): "CKTOTAL", "CKMB", "CKMBINDEX", "TROPONINI" in the last 168 hours. BNP (last 3 results) No results for input(s): "PROBNP" in the last 8760 hours. HbA1C: No results for input(s): "HGBA1C" in the last 72 hours. CBG: Recent Labs  Lab 08/07/22 0216 08/07/22 0840 08/07/22 1649 08/08/22 0111 08/08/22 0757  GLUCAP 103* 84 137* 109* 114*   Lipid Profile: No results for input(s): "CHOL", "HDL", "LDLCALC", "TRIG", "CHOLHDL", "LDLDIRECT" in the last 72 hours. Thyroid Function Tests: No results for input(s): "TSH", "T4TOTAL", "FREET4", "T3FREE", "THYROIDAB" in the last 72 hours. Anemia Panel: No results for input(s): "VITAMINB12", "FOLATE", "FERRITIN", "TIBC", "IRON", "RETICCTPCT" in the last 72 hours. Sepsis Labs: No results for input(s): "PROCALCITON", "LATICACIDVEN" in the last 168 hours.  No results found for this or any previous visit (from the past 240  hour(s)).       Radiology Studies: US THORACENTESIS ASP PLEURAL SPACE W/IMG GUIDE  Result Date: 08/06/2022 INDICATION: Shortness of breath with respiratory failure and bilateral pleural effusions. EXAM: ULTRASOUND GUIDED RIGHT THORACENTESIS MEDICATIONS: Local 1% lidocaine only. COMPLICATIONS: None immediate. PROCEDURE: An ultrasound guided thoracentesis was thoroughly discussed with the patient and questions answered. The benefits, risks, alternatives and complications were also discussed. The patient understands and wishes to proceed with the procedure. Written consent was obtained. Ultrasound was performed to localize and mark an adequate pocket of fluid in the right chest. The area was then prepped and draped in the normal sterile fashion. 1% Lidocaine was used for local anesthesia. Under ultrasound guidance a 19 gauge, 10cm Yueh catheter was introduced. Thoracentesis was performed. The catheter was removed and a dressing applied. FINDINGS: A total of approximately 700 cc of clear amber colored fluid was removed. Samples were sent to the laboratory as requested by the clinical team. IMPRESSION: Successful ultrasound guided right thoracentesis yielding 700 cc of pleural fluid. This exam was performed by Tsosie Billing PA-C, and was supervised and interpreted by Dr. Dwaine Gale. Electronically Signed   By: Miachel Roux M.D.   On: 08/06/2022 16:14   DG Chest Port 1 View  Result Date: 08/06/2022 CLINICAL DATA:  Status post right thoracentesis. EXAM: PORTABLE CHEST 1 VIEW COMPARISON:  August 03, 2022. FINDINGS: No pneumothorax is noted status post right thoracentesis. Decreased right pleural effusion is noted. IMPRESSION: No pneumothorax status post right thoracentesis. Electronically Signed   By: Marijo Conception M.D.   On: 08/06/2022 15:20        Scheduled Meds:  amiodarone  200 mg Oral Daily   apixaban  5 mg Oral BID   atorvastatin  20 mg Oral Daily   budesonide (PULMICORT) nebulizer solution  0.25 mg  Nebulization BID   buPROPion  150 mg Oral Daily   Chlorhexidine Gluconate Cloth  6 each Topical Daily   diltiazem  180 mg Oral Daily   feeding supplement  237 mL Oral BID BM   hydrocerin   Topical BID   ipratropium-albuterol  3 mL Nebulization BID   isosorbide mononitrate  15 mg Oral BID   levothyroxine  250 mcg Oral Q0600   metoprolol tartrate  50 mg Oral BID   midodrine  10 mg Oral TID WC   mouth rinse  15 mL Mouth Rinse 4 times per day   pantoprazole  40 mg Oral Daily   rOPINIRole  1 mg Oral QHS   Continuous Infusions:  LOS: 22 days    Sidney Ace, MD [facility]   If 7PM-7AM, please contact night-coverage  08/08/2022, 10:50 AM

## 2022-08-08 NOTE — Progress Notes (Signed)
[facility]    SUBJECTIVE: Complains of chest pressure still mild dyspnea no wheezing still able to take a deep breath no cough has had thoracentesis feels reasonably well lying in bed still has generalized weakness   Vitals:   08/07/22 2019 08/07/22 2130 08/08/22 0324 08/08/22 0756  BP:   95/64 108/68  Pulse:   83 91  Resp:   18 (!) 24  Temp: 97.7 F (36.5 C)  97.7 F (36.5 C) 97.7 F (36.5 C)  TempSrc: Oral  Oral Oral  SpO2:  97% 97% 98%  Weight:      Height:         Intake/Output Summary (Last 24 hours) at 08/08/2022 1126 Last data filed at 08/08/2022 1000 Gross per 24 hour  Intake --  Output 1055 ml  Net -1055 ml      PHYSICAL EXAM  General: Well developed, well nourished, in no acute distress HEENT:  Normocephalic and atramatic Neck:  No JVD.  Lungs: Clear bilaterally to auscultation and percussion. Heart: HRRR . Normal S1 and S2 without gallops or murmurs.  Abdomen: Bowel sounds are positive, abdomen soft and non-tender  Msk:  Back normal, normal gait. Normal strength and tone for age. Extremities: No clubbing, cyanosis or edema.   Neuro: Alert and oriented X 3. Psych:  Good affect, responds appropriately   LABS: Basic Metabolic Panel: Recent Labs    08/06/22 0846 08/07/22 0753 08/08/22 0900  NA 141 139 139  K 4.2 4.0 4.0  CL 98 94* 94*  CO2 31 31 34*  GLUCOSE 78 83 88  BUN 74* 74* 72*  CREATININE 2.26* 2.26* 2.17*  CALCIUM 8.4* 8.3* 8.2*  MG 2.7*  --   --    Liver Function Tests: No results for input(s): "AST", "ALT", "ALKPHOS", "BILITOT", "PROT", "ALBUMIN" in the last 72 hours. No results for input(s): "LIPASE", "AMYLASE" in the last 72 hours. CBC: Recent Labs    08/07/22 0758 08/08/22 0900  WBC 9.4 10.1  NEUTROABS 7.5 8.0*  HGB 11.7* 11.0*  HCT 40.8 38.6  MCV 88.3 88.3  PLT 468* 465*   Cardiac Enzymes: No results for input(s): "CKTOTAL", "CKMB", "CKMBINDEX", "TROPONINI" in the last 72 hours. BNP: Invalid input(s):  "POCBNP" D-Dimer: No results for input(s): "DDIMER" in the last 72 hours. Hemoglobin A1C: No results for input(s): "HGBA1C" in the last 72 hours. Fasting Lipid Panel: No results for input(s): "CHOL", "HDL", "LDLCALC", "TRIG", "CHOLHDL", "LDLDIRECT" in the last 72 hours. Thyroid Function Tests: No results for input(s): "TSH", "T4TOTAL", "T3FREE", "THYROIDAB" in the last 72 hours.  Invalid input(s): "FREET3" Anemia Panel: No results for input(s): "VITAMINB12", "FOLATE", "FERRITIN", "TIBC", "IRON", "RETICCTPCT" in the last 72 hours.  US THORACENTESIS ASP PLEURAL SPACE W/IMG GUIDE  Result Date: 08/06/2022 INDICATION: Shortness of breath with respiratory failure and bilateral pleural effusions. EXAM: ULTRASOUND GUIDED RIGHT THORACENTESIS MEDICATIONS: Local 1% lidocaine only. COMPLICATIONS: None immediate. PROCEDURE: An ultrasound guided thoracentesis was thoroughly discussed with the patient and questions answered. The benefits, risks, alternatives and complications were also discussed. The patient understands and wishes to proceed with the procedure. Written consent was obtained. Ultrasound was performed to localize and mark an adequate pocket of fluid in the right chest. The area was then prepped and draped in the normal sterile fashion. 1% Lidocaine was used for local anesthesia. Under ultrasound guidance a 19 gauge, 10cm Yueh catheter was introduced. Thoracentesis was performed. The catheter was removed and a dressing applied. FINDINGS: A total of approximately 700 cc of clear amber  colored fluid was removed. Samples were sent to the laboratory as requested by the clinical team. IMPRESSION: Successful ultrasound guided right thoracentesis yielding 700 cc of pleural fluid. This exam was performed by Tsosie Billing PA-C, and was supervised and interpreted by Dr. Dwaine Gale. Electronically Signed   By: Miachel Roux M.D.   On: 08/06/2022 16:14   DG Chest Port 1 View  Result Date: 08/06/2022 CLINICAL DATA:   Status post right thoracentesis. EXAM: PORTABLE CHEST 1 VIEW COMPARISON:  August 03, 2022. FINDINGS: No pneumothorax is noted status post right thoracentesis. Decreased right pleural effusion is noted. IMPRESSION: No pneumothorax status post right thoracentesis. Electronically Signed   By: Marijo Conception M.D.   On: 08/06/2022 15:20     Echo severely depressed left ventricular function EF around 25 to 30%  TELEMETRY: Sinus rhythm 80 nonspecific ST-T wave changes:  ASSESSMENT AND PLAN:  Principal Problem:   Paroxysmal atrial fibrillation with RVR (HCC) Active Problems:   Type 2 diabetes mellitus with diabetic neuropathy, without long-term current use of insulin (HCC)   Hypothyroidism   Acute metabolic encephalopathy   HLD (hyperlipidemia)   Elevated troponin   OSA (obstructive sleep apnea)   Acute respiratory failure with hypoxia (HCC)   Acute on chronic systolic CHF (congestive heart failure) (HCC)   Acute on chronic respiratory failure with hypoxia and hypercapnia (HCC)   AKI (acute kidney injury) ([place])   Thoracic aortic aneurysm (HCC)   Hyperkalemia   Hypotension   Leukocytosis   Normocytic anemia   Acute pulmonary edema (HCC)   HAP (hospital-acquired pneumonia)   Goals of care, counseling/discussion   Delirium   Bilateral pleural effusion    Plan Shortness of breath dyspnea related to deconditioning obstructive sleep apnea acute on chronic systolic heart failure continue current therapy with diuretics Pleural effusion status postthoracentesis continue modest diuretic therapy Paroxysmal atrial fibrillation patient currently in sinus rhythm continue anticoagulation continue rate control with diltiazem and metoprolol amiodarone for rhythm Diabetes type 2 continue current therapy patient currently maintained on diet control insulin as needed Acute on chronic renal insufficiency continue manage renal hydration and agree with nephrology input Obesity recommend significant weight  loss modest exercise Generalized weakness agree with physical therapy Obstructive sleep apnea will have the patient continue CPAP while asleep Hypotension patient on midodrine therapy to help with blood pressure support History of hospital-acquired pneumonia status post antibiotics therapy with significant improvement in symptoms Patient will probably need skilled nursing for rehab upon discharge  Yolonda Kida, MD 08/08/2022 11:26 AM

## 2022-08-08 NOTE — Progress Notes (Signed)
[facility]  ROUNDING NOTE   Subjective:   Madison Mcbride with a past medical history of chronic diastolic heart failure, morbid obesity, hypothyroidism, diabetes mellitus type 2, obstructive sleep apnea, lymphedema, and chronic kidney disease stage IIIa.  Patient presents to the emergency department with complaints of shortness of breath.  She has been admitted for Acute pulmonary edema (HCC) [J81.0] Atrial fibrillation with RVR (HCC) [I48.91] HCAP (healthcare-associated pneumonia) [J18.9] Acute on chronic respiratory failure with hypoxia and hypercapnia ([place]) [K27.06, J96.22]  Patient is known to our practice and is seen outpatient by Dr. Holley Raring.  Patient was last seen in our office on April 03, 2021.  Patient has been lost to follow-up since that time.    Patient sitting up in bed No family at bedside Alert with slow responses Eating cereal States she can't tell a difference in respiratory status after receiving furosemide yesterday.    Objective:  Vital signs in last 24 hours:  Temp:  [97.7 F (36.5 C)] 97.7 F (36.5 C) (01/27 0756) Pulse Rate:  [83-97] 91 (01/27 0756) Resp:  [12-24] 24 (01/27 0756) BP: (95-108)/(64-72) 108/68 (01/27 0756) SpO2:  [97 %-99 %] 98 % (01/27 0756) FiO2 (%):  [40 %] 40 % (01/26 2130)  Weight change:  Filed Weights   07/28/22 0904 08/01/22 0500 08/06/22 1733  Weight: (!) 137.5 kg 133.5 kg 134 kg    Intake/Output: I/O last 3 completed shifts: In: -  Out: 900 [Urine:900]   Intake/Output this shift:  Total I/O In: -  Out: 155 [Urine:155]  Physical Exam: General: NAD  Head: Normocephalic, atraumatic. Moist oral mucosal membranes  Eyes: Anicteric  Lungs:  Diminished in bases, [place] O2  Heart: Regular rate and rhythm  Abdomen:  Soft, nontender, obese  Extremities: No peripheral edema.  Neurologic: Alert, able to move all four extremities  Skin: No lesions  Access: None    Basic Metabolic Panel: Recent Labs   Lab 08/01/22 1322 08/02/22 0901 08/04/22 0836 08/05/22 0407 08/06/22 0846 08/07/22 0753 08/08/22 0900  NA 140   < > 141 142 141 139 139  K 4.8   < > 4.3 4.0 4.2 4.0 4.0  CL 93*   < > 95* 96* 98 94* 94*  CO2 30   < > 32 34* 31 31 34*  GLUCOSE 116*   < > 98 81 78 83 88  BUN 45*   < > 69* 71* 74* 74* 72*  CREATININE 1.63*   < > 2.23* 2.19* 2.26* 2.26* 2.17*  CALCIUM 8.8*   < > 8.3* 8.1* 8.4* 8.3* 8.2*  MG 2.7*  --  2.5*  --  2.7*  --   --   PHOS  --   --  4.5  --   --   --   --    < > = values in this interval not displayed.     Liver Function Tests: No results for input(s): "AST", "ALT", "ALKPHOS", "BILITOT", "PROT", "ALBUMIN" in the last 168 hours.  No results for input(s): "LIPASE", "AMYLASE" in the last 168 hours. No results for input(s): "AMMONIA" in the last 168 hours.  CBC: Recent Labs  Lab 08/03/22 0310 08/04/22 0836 08/06/22 0846 08/07/22 0758 08/08/22 0900  WBC 12.7* 9.9 10.3 9.4 10.1  NEUTROABS 10.5* 7.8* 8.5* 7.5 8.0*  HGB 10.7* 11.7* 11.9* 11.7* 11.0*  HCT 37.8 40.9 42.3 40.8 38.6  MCV 88.1 88.5 89.1 88.3 88.3  PLT 412* 446* 493* 468* 465*     Cardiac  Enzymes: No results for input(s): "CKTOTAL", "CKMB", "CKMBINDEX", "TROPONINI" in the last 168 hours.  BNP: Invalid input(s): "POCBNP"  CBG: Recent Labs  Lab 08/07/22 0216 08/07/22 0840 08/07/22 1649 08/08/22 0111 08/08/22 0757  GLUCAP 103* 84 137* 109* 114*     Microbiology: Results for orders placed or performed during the hospital encounter of 08/11/2022  Blood culture (routine x 2)     Status: None   Collection Time: 08/10/2022 11:01 PM   Specimen: BLOOD  Result Value Ref Range Status   Specimen Description BLOOD BLOOD RIGHT WRIST  Final   Special Requests   Final    BOTTLES DRAWN AEROBIC AND ANAEROBIC Blood Culture results may not be optimal due to an inadequate volume of blood received in culture bottles   Culture   Final    NO GROWTH 5 DAYS Performed at [facility], [address], [place], [place] [address]    Report Status 07/22/2022 FINAL  Final  Blood culture (routine x 2)     Status: None   Collection Time: 07/22/2022 11:01 PM   Specimen: BLOOD  Result Value Ref Range Status   Specimen Description BLOOD BLOOD LEFT HAND  Final   Special Requests IN PEDIATRIC BOTTLE Blood Culture adequate volume  Final   Culture   Final    NO GROWTH 5 DAYS Performed at [facility], [address] [place], [place] [address]    Report Status 07/22/2022 FINAL  Final  MRSA Next Gen by PCR, Nasal     Status: None   Collection Time: 07/18/22 10:11 AM   Specimen: Nasal Mucosa; Nasal Swab  Result Value Ref Range Status   MRSA by PCR Next Gen NOT DETECTED NOT DETECTED Final    Comment: (NOTE) The GeneXpert MRSA Assay (FDA approved for NASAL specimens only), is one component of a comprehensive MRSA colonization surveillance program. It is not intended to diagnose MRSA infection nor to guide or monitor treatment for MRSA infections. Test performance is not FDA approved in patients less than 58 years old. Performed at [facility], [place], [place] [address]   Respiratory (~20 pathogens) panel by PCR     Status: None   Collection Time: 07/18/22 11:07 AM   Specimen: Nasopharyngeal Swab; Respiratory  Result Value Ref Range Status   Adenovirus NOT DETECTED NOT DETECTED Final   Coronavirus 229E NOT DETECTED NOT DETECTED Final    Comment: (NOTE) The Coronavirus on the Respiratory Panel, DOES NOT test for the novel  Coronavirus (2019 nCoV)    Coronavirus HKU1 NOT DETECTED NOT DETECTED Final   Coronavirus NL63 NOT DETECTED NOT DETECTED Final   Coronavirus OC43 NOT DETECTED NOT DETECTED Final   Metapneumovirus NOT DETECTED NOT DETECTED Final   Rhinovirus / Enterovirus NOT DETECTED NOT DETECTED Final   Influenza A NOT DETECTED NOT DETECTED Final   Influenza B NOT DETECTED NOT DETECTED Final   Parainfluenza Virus 1 NOT DETECTED NOT  DETECTED Final   Parainfluenza Virus 2 NOT DETECTED NOT DETECTED Final   Parainfluenza Virus 3 NOT DETECTED NOT DETECTED Final   Parainfluenza Virus 4 NOT DETECTED NOT DETECTED Final   Respiratory Syncytial Virus NOT DETECTED NOT DETECTED Final   Bordetella pertussis NOT DETECTED NOT DETECTED Final   Bordetella Parapertussis NOT DETECTED NOT DETECTED Final   Chlamydophila pneumoniae NOT DETECTED NOT DETECTED Final   Mycoplasma pneumoniae NOT DETECTED NOT DETECTED Final    Comment: Performed at [facility], [place]. [address],  [place], [place] [address]  Urine Culture     Status: None   Collection Time: 07/23/22  6:00 AM   Specimen: Urine, Clean Catch  Result Value Ref Range Status   Specimen Description   Final    URINE, CLEAN CATCH Performed at [facility], [address] [place], [place] [address]    Special Requests   Final    Normal Performed at [facility], [address] [place], [place] [address]    Culture   Final    NO GROWTH Performed at [facility], [place] [address], [place], [place] [address]    Report Status 07/24/2022 FINAL  Final    Coagulation Studies: No results for input(s): "LABPROT", "INR" in the last 72 hours.  Urinalysis: No results for input(s): "COLORURINE", "LABSPEC", "PHURINE", "GLUCOSEU", "HGBUR", "BILIRUBINUR", "KETONESUR", "PROTEINUR", "UROBILINOGEN", "NITRITE", "LEUKOCYTESUR" in the last 72 hours.  Invalid input(s): "APPERANCEUR"    Imaging: US THORACENTESIS ASP PLEURAL SPACE W/IMG GUIDE  Result Date: 08/06/2022 INDICATION: Shortness of breath with respiratory failure and bilateral pleural effusions. EXAM: ULTRASOUND GUIDED RIGHT THORACENTESIS MEDICATIONS: Local 1% lidocaine only. COMPLICATIONS: None immediate. PROCEDURE: An ultrasound guided thoracentesis was thoroughly discussed with the patient and questions answered. The benefits, risks, alternatives and complications were also discussed. The patient  understands and wishes to proceed with the procedure. Written consent was obtained. Ultrasound was performed to localize and mark an adequate pocket of fluid in the right chest. The area was then prepped and draped in the normal sterile fashion. 1% Lidocaine was used for local anesthesia. Under ultrasound guidance a 19 gauge, 10cm Yueh catheter was introduced. Thoracentesis was performed. The catheter was removed and a dressing applied. FINDINGS: A total of approximately 700 cc of clear amber colored fluid was removed. Samples were sent to the laboratory as requested by the clinical team. IMPRESSION: Successful ultrasound guided right thoracentesis yielding 700 cc of pleural fluid. This exam was performed by Tsosie Billing PA-C, and was supervised and interpreted by Dr. Dwaine Gale. Electronically Signed   By: Miachel Roux M.D.   On: 08/06/2022 16:14   DG Chest Port 1 View  Result Date: 08/06/2022 CLINICAL DATA:  Status post right thoracentesis. EXAM: PORTABLE CHEST 1 VIEW COMPARISON:  August 03, 2022. FINDINGS: No pneumothorax is noted status post right thoracentesis. Decreased right pleural effusion is noted. IMPRESSION: No pneumothorax status post right thoracentesis. Electronically Signed   By: Marijo Conception M.D.   On: 08/06/2022 15:20     Medications:      amiodarone  200 mg Oral Daily   apixaban  5 mg Oral BID   atorvastatin  20 mg Oral Daily   budesonide (PULMICORT) nebulizer solution  0.25 mg Nebulization BID   buPROPion  150 mg Oral Daily   Chlorhexidine Gluconate Cloth  6 each Topical Daily   diltiazem  180 mg Oral Daily   feeding supplement  237 mL Oral BID BM   hydrocerin   Topical BID   ipratropium-albuterol  3 mL Nebulization BID   isosorbide mononitrate  15 mg Oral BID   levothyroxine  250 mcg Oral Q0600   metoprolol tartrate  50 mg Oral BID   midodrine  10 mg Oral TID WC   mouth rinse  15 mL Mouth Rinse 4 times per day   pantoprazole  40 mg Oral Daily   rOPINIRole  1 mg Oral QHS    acetaminophen, ALPRAZolam, guaiFENesin, levalbuterol, liver oil-zinc oxide, LORazepam, metoprolol tartrate, ondansetron (ZOFRAN) IV, mouth rinse,  polyethylene glycol, senna-docusate  Assessment/ Plan:  Ms. Madison Mcbride is a 67 y.o.  female  past medical history of chronic diastolic heart failure, morbid obesity, hypothyroidism, diabetes mellitus type 2, obstructive sleep apnea, lymphedema, and chronic kidney disease stage IIIa.  Patient presents to the emergency department with complaints of shortness of breath.  She has been admitted for Acute pulmonary edema (HCC) [J81.0] Atrial fibrillation with RVR (HCC) [I48.91] HCAP (healthcare-associated pneumonia) [J18.9] Acute on chronic respiratory failure with hypoxia and hypercapnia (HCC) [W10.93, J96.22]   Acute Kidney Injury on chronic kidney disease stage IIIa with baseline creatinine 1.35 and GFR of 54 on 07/16/22.  Acute kidney injury appears multifactorial due to hypotension and hemodynamic instability. Renal ultrasound negative for obstruction.    Creatinine slightly improved after furosemide dose yesterday. Recorded urine output of 969m overnight. Doesn't appear that furosemide made a significant difference. Will continue to monitor   Lab Results  Component Value Date   CREATININE 2.17 (H) 08/08/2022   CREATININE 2.26 (H) 08/07/2022   CREATININE 2.26 (H) 08/06/2022    Intake/Output Summary (Last 24 hours) at 08/08/2022 1134 Last data filed at 08/08/2022 1000 Gross per 24 hour  Intake --  Output 1055 ml  Net -1055 ml    2.  Acute on chronic systolic heart failure.  Echo completed on 03/06/2022 shows EF 45 to 50% with a mild to moderate MVR, TVR, and AVR.  Cardiology continues to follow.  Thoracentesis completed on 08/06/22 with 700 mL fluid removal.   Received Furosemide '20mg'$  on 08/07/22  3. Anemia of chronic kidney disease  Normocytic Lab Results  Component Value Date   HGB 11.0 (L) 08/08/2022  Hemoglobin stable.  Will  monitor for now.  4.  Hypertension with chronic kidney disease. Receiving diltiazem, hydralazine, isosorbide, and metoprolol.  Torsemide held. Receiving midodrine '10mg'$  three times daily.  Blood pressure stable for this patient.      LOS: 2[place]1/27/202411:34 AM

## 2022-08-09 DIAGNOSIS — I480 Paroxysmal atrial fibrillation: Secondary | ICD-10-CM | POA: Diagnosis not present

## 2022-08-09 LAB — MAGNESIUM: Magnesium: 2.5 mg/dL — ABNORMAL HIGH (ref 1.7–2.4)

## 2022-08-09 LAB — BASIC METABOLIC PANEL
Anion gap: 8 (ref 5–15)
BUN: 69 mg/dL — ABNORMAL HIGH (ref 8–23)
CO2: 33 mmol/L — ABNORMAL HIGH (ref 22–32)
Calcium: 8.4 mg/dL — ABNORMAL LOW (ref 8.9–10.3)
Chloride: 96 mmol/L — ABNORMAL LOW (ref 98–111)
Creatinine, Ser: 2.18 mg/dL — ABNORMAL HIGH (ref 0.44–1.00)
GFR, Estimated: 24 mL/min — ABNORMAL LOW (ref >60)
Glucose, Bld: 73 mg/dL (ref 70–99)
Potassium: 3.9 mmol/L (ref 3.5–5.1)
Sodium: 137 mmol/L (ref 135–145)

## 2022-08-09 LAB — GLUCOSE, CAPILLARY
Glucose-Capillary: 101 mg/dL — ABNORMAL HIGH (ref 70–99)
Glucose-Capillary: 104 mg/dL — ABNORMAL HIGH (ref 70–99)
Glucose-Capillary: 122 mg/dL — ABNORMAL HIGH (ref 70–99)
Glucose-Capillary: 83 mg/dL (ref 70–99)
Glucose-Capillary: 99 mg/dL (ref 70–99)

## 2022-08-09 MED ORDER — FUROSEMIDE 10 MG/ML IJ SOLN
20.0000 mg | Freq: Once | INTRAMUSCULAR | Status: AC
  Administered 2022-08-09: 20 mg via INTRAVENOUS
  Filled 2022-08-09: qty 2

## 2022-08-09 NOTE — Progress Notes (Signed)
PROGRESS NOTE    Madison Mcbride  NUU:[id number] DOB: May 08, 1956 DOA: 08/05/2022 PCP: Rusty Aus, MD    Brief Narrative:  67 y.o. female with medical history significant for morbid obesity, chronic diastolic CHF, hypothyroidism, type II DM, obstructive sleep apnea, lymphedema, CKD stage IIIa, OSA, chronic hypoxic and hypercapnic respiratory failure on 2 L/min oxygen and trilogy machine at night.  Presented to [facility] ED 06/27/2022 from a skilled nursing facility out of concern for tachycardia, heart rates on admission were in the 130s, Presented with leukocytosis to 12.6K, UA concerning for cystitis. She initially met sepsis criteria, started on IV meropenem for treatment of cystitis due to history of ESBL and IV diltiazem for AF RVR.  Discharge back to SNF was planned to the afternoon of 07/15/2021, but the patient was tachycardic, and the SNF refused to accept her.   Respiratory status has been very tenuous during this admission.  She is constantly short of breath.  Conversational dyspnea even with minimal exertion.  Even eating causes her dyspnea worsen.  We attempted thoracentesis on 1/24.  Unfortunately patient was unable to tolerate.  After premedication patient was able to tolerate procedure on 1/25.  700 cc fluid removed.  Assessment & Plan:   Principal Problem:   Paroxysmal atrial fibrillation with RVR (HCC) Active Problems:   Acute on chronic respiratory failure with hypoxia and hypercapnia (HCC)   Acute metabolic encephalopathy   Acute on chronic systolic CHF (congestive heart failure) (HCC)   HLD (hyperlipidemia)   OSA (obstructive sleep apnea)   Type 2 diabetes mellitus with diabetic neuropathy, without long-term current use of insulin (HCC)   Elevated troponin   Hypothyroidism   Acute respiratory failure with hypoxia (HCC)   AKI (acute kidney injury) ([place])   Thoracic aortic aneurysm (HCC)   Hyperkalemia   Hypotension   Leukocytosis   Normocytic anemia   Acute  pulmonary edema (HCC)   HAP (hospital-acquired pneumonia)   Goals of care, counseling/discussion   Delirium   Bilateral pleural effusion  Acute on chronic systolic CHF, pulmonary hypertension: Status post low-dose Lasix challenge.  20 mg p.o. x 1 given on 1/26.  Creatinine stable.  Patient may benefit from daily low-dose diuretic.  Discussed with nephrology.  Defer to them     AKI on CKD stage IIIa, hyperkalemia: Creatinine relatively stable.  May have establish new baseline.  No further IV fluids.  Patient may benefit from repeat Lasix challenge     Acute on chronic hypoxic and hypercapnic respiratory failure, COPD, OHS, OSA: Oxygen requirement has gone up from baseline.  Was requiring maximum of 6 L via nasal cannula.  Postthoracentesis down to 4 L.  BiPAP at night.  Baseline requirement of prior to hospitalization was 2 L.  Encourage mobility and incentive spirometry use   Bilateral pleural effusion, R>L: Right thoracentesis was ordered on 08/03/2022.  Patient was able to tolerate procedure with premedication on 1/25.  700 cc of fluid removed.  Atrial fibrillation with RVR: Overall, heart rate is better.  She is in mild sinus tachycardia.  Continue amiodarone, Cardizem and metoprolol per cardiologist.  She is on Eliquis for stroke prophylaxis.  Cardiology [place] clinic follow-up     Hypotension: Continue midodrine     Acute toxic metabolic encephalopathy, delirium: Improved.  Completed 3-day course of Haldol on 08/02/2022.  CT head obtained on 08/01/2022 did not show any acute abnormality.  Mental status appears to be baseline   Recent hypoglycemia, hypokalemia: Resolved     Pneumonia:  Completed antibiotics on 07/25/2022.   Other comorbidities include type II DM, restless leg syndrome, hypothyroidism, chronic venous stasis, anxiety, depression, chronic pain   DVT prophylaxis: Eliquis Code Status: Full Family Communication: Close friend at bedside 1/24 Disposition Plan: Status is:  Inpatient Remains inpatient appropriate because: Multiple acute medical issues.  Disposition plan unknown at this time.   Level of care: Telemetry Cardiac  Consultants:  Cardiology Nephrology Palliative care  Procedures:  None  Antimicrobials: None   Subjective: Seen and examined.  Endorses shortness of breath yesterday but improved today.  Objective: Vitals:   08/08/22 2344 08/09/22 0442 08/09/22 0800 08/09/22 0911  BP: 113/71 98/64  107/69  Pulse: 91 94 94 89  Resp: '19 18 20 18  '$ Temp: 98.2 F (36.8 C) 97.7 F (36.5 C)  97.7 F (36.5 C)  TempSrc: Oral Oral  Oral  SpO2: 98% 98% 99% 96%  Weight:      Height:        Intake/Output Summary (Last 24 hours) at 08/09/2022 1117 Last data filed at 08/09/2022 0500 Gross per 24 hour  Intake --  Output 325 ml  Net -325 ml    Filed Weights   07/28/22 0904 08/01/22 0500 08/06/22 1733  Weight: (!) 137.5 kg 133.5 kg 134 kg    Examination:  General exam: Appears fatigued and chronically ill Respiratory system: Lung sounds diminished at bases.  No wheeze noted.  Normal work of breathing.  4 L Cardiovascular system: S1-S2, tachycardic, no appreciable murmurs, Gastrointestinal system: Obese, soft, NT/ND, normal bowel sounds Central nervous system: Alert and oriented. No focal neurological deficits. Extremities: Symmetrically decreased power bilaterally Skin: No rashes, lesions or ulcers Psychiatry: Judgement and insight appear impaired. Mood & affect flattened.     Data Reviewed: I have personally reviewed following labs and imaging studies  CBC: Recent Labs  Lab 08/03/22 0310 08/04/22 0836 08/06/22 0846 08/07/22 0758 08/08/22 0900  WBC 12.7* 9.9 10.3 9.4 10.1  NEUTROABS 10.5* 7.8* 8.5* 7.5 8.0*  HGB 10.7* 11.7* 11.9* 11.7* 11.0*  HCT 37.8 40.9 42.3 40.8 38.6  MCV 88.1 88.5 89.1 88.3 88.3  PLT 412* 446* 493* 468* 382*   Basic Metabolic Panel: Recent Labs  Lab 08/04/22 0836 08/05/22 0407 08/06/22 0846  08/07/22 0753 08/08/22 0900 08/09/22 0752  NA 141 142 141 139 139 137  K 4.3 4.0 4.2 4.0 4.0 3.9  CL 95* 96* 98 94* 94* 96*  CO2 32 34* 31 31 34* 33*  GLUCOSE 98 81 78 83 88 73  BUN 69* 71* 74* 74* 72* 69*  CREATININE 2.23* 2.19* 2.26* 2.26* 2.17* 2.18*  CALCIUM 8.3* 8.1* 8.4* 8.3* 8.2* 8.4*  MG 2.5*  --  2.7*  --   --  2.5*  PHOS 4.5  --   --   --   --   --    GFR: Estimated Creatinine Clearance: 34.2 mL/min (A) (by C-G formula based on SCr of 2.18 mg/dL (H)). Liver Function Tests: No results for input(s): "AST", "ALT", "ALKPHOS", "BILITOT", "PROT", "ALBUMIN" in the last 168 hours. No results for input(s): "LIPASE", "AMYLASE" in the last 168 hours. No results for input(s): "AMMONIA" in the last 168 hours. Coagulation Profile: No results for input(s): "INR", "PROTIME" in the last 168 hours. Cardiac Enzymes: No results for input(s): "CKTOTAL", "CKMB", "CKMBINDEX", "TROPONINI" in the last 168 hours. BNP (last 3 results) No results for input(s): "PROBNP" in the last 8760 hours. HbA1C: No results for input(s): "HGBA1C" in the last 72 hours. CBG: Recent  Labs  Lab 08/08/22 0111 08/08/22 0757 08/08/22 1628 08/09/22 0003 08/09/22 0908  GLUCAP 109* 114* 117* 101* 83   Lipid Profile: No results for input(s): "CHOL", "HDL", "LDLCALC", "TRIG", "CHOLHDL", "LDLDIRECT" in the last 72 hours. Thyroid Function Tests: No results for input(s): "TSH", "T4TOTAL", "FREET4", "T3FREE", "THYROIDAB" in the last 72 hours. Anemia Panel: No results for input(s): "VITAMINB12", "FOLATE", "FERRITIN", "TIBC", "IRON", "RETICCTPCT" in the last 72 hours. Sepsis Labs: No results for input(s): "PROCALCITON", "LATICACIDVEN" in the last 168 hours.  No results found for this or any previous visit (from the past 240 hour(s)).       Radiology Studies: DG Chest Port 1 View  Result Date: 08/08/2022 CLINICAL DATA:  Shortness of breath EXAM: PORTABLE CHEST 1 VIEW COMPARISON:  08/06/2022 FINDINGS: Cardiac  shadow is enlarged but stable. Increasing vascular congestion is noted. Small effusions are noted bilaterally. No pneumothorax is noted. No bony abnormality is seen. IMPRESSION: Worsening CHF. Electronically Signed   By: Inez Catalina M.D.   On: 08/08/2022 18:33        Scheduled Meds:  amiodarone  200 mg Oral Daily   apixaban  5 mg Oral BID   atorvastatin  20 mg Oral Daily   budesonide (PULMICORT) nebulizer solution  0.25 mg Nebulization BID   buPROPion  150 mg Oral Daily   Chlorhexidine Gluconate Cloth  6 each Topical Daily   diltiazem  180 mg Oral Daily   feeding supplement  237 mL Oral BID BM   furosemide  20 mg Intravenous Once   hydrocerin   Topical BID   ipratropium-albuterol  3 mL Nebulization BID   isosorbide mononitrate  15 mg Oral BID   levothyroxine  250 mcg Oral Q0600   metoprolol tartrate  50 mg Oral BID   midodrine  10 mg Oral TID WC   mouth rinse  15 mL Mouth Rinse 4 times per day   pantoprazole  40 mg Oral Daily   rOPINIRole  1 mg Oral QHS   Continuous Infusions:   LOS: 23 days    Sidney Ace, MD [facility]   If 7PM-7AM, please contact night-coverage  08/09/2022, 11:17 AM

## 2022-08-09 NOTE — TOC Progression Note (Signed)
Transition of Care [facility]) - Progression Note    Patient Details  Name: Madison Mcbride MRN: [id number] Date of Birth: [birth year]  [facility] CM/SW Contact  Izola Price, RN Phone Number: 08/09/2022, 1:57 PM  Clinical Narrative:  1/28: Ongoing medical issues and indeterminate final disposition as barriers to discharge or return to [facility] today. Simmie Davies RN CM      Expected Discharge Plan: Skilled Nursing Facility Barriers to Discharge: Continued Medical Work up  Expected Discharge Plan and Services   Discharge Planning Services: CM Consult Post Acute Care Choice: Resumption of Svcs/PTA Provider Living arrangements for the past 2 months: [place]                 DME Arranged: N/A DME Agency: NA       HH Arranged: NA [place] Agency: NA         Social Determinants of Health (SDOH) Interventions [place]: No Food Insecurity (07/18/2022)  Housing: Low Risk  (07/18/2022)  Transportation Needs: No Transportation Needs (07/18/2022)  Utilities: Not At Risk (07/18/2022)  Tobacco Use: Low Risk  (08/05/2022)    Readmission Risk Interventions    07/08/2022   12:43 PM 03/07/2022    4:12 PM  Readmission Risk Prevention Plan  Transportation Screening Complete Complete  PCP or Specialist Appt within 5-7 Days  Complete  Home Care Screening  Complete  Medication Review (RN CM)  Complete  Medication Review (RN Care Manager) Complete   PCP or Specialist appointment within 3-5 days of discharge Complete   SW Recovery Care/Counseling Consult Complete   Skilled Nursing Facility Complete

## 2022-08-09 NOTE — Progress Notes (Signed)
[facility]  ROUNDING NOTE   Subjective:   Madison Mcbride with a past medical history of chronic diastolic heart failure, morbid obesity, hypothyroidism, diabetes mellitus type 2, obstructive sleep apnea, lymphedema, and chronic kidney disease stage IIIa.  Patient presents to the emergency department with complaints of shortness of breath.  She has been admitted for Acute pulmonary edema (HCC) [J81.0] Atrial fibrillation with RVR (HCC) [I48.91] HCAP (healthcare-associated pneumonia) [J18.9] Acute on chronic respiratory failure with hypoxia and hypercapnia ([place]) [Z61.09, J96.22]  Patient is known to our practice and is seen outpatient by Dr. Holley Raring.  Patient was last seen in our office on April 03, 2021.  Patient has been lost to follow-up since that time.    Patient seen sitting up in bed, breakfast tray at bedside partially consumed Patient alert and oriented States she feels okay this morning, denies shortness of breath Remains on.  4 L nasal cannula Dependent edema noted at hips  Objective:  Vital signs in last 24 hours:  Temp:  [97.6 F (36.4 C)-98.2 F (36.8 C)] 97.7 F (36.5 C) (01/28 0911) Pulse Rate:  [89-94] 89 (01/28 0911) Resp:  [18-24] 18 (01/28 0911) BP: (98-113)/(64-72) 107/69 (01/28 0911) SpO2:  [92 %-99 %] 96 % (01/28 0911) FiO2 (%):  [40 %] 40 % (01/27 2301)  Weight change:  Filed Weights   07/28/22 0904 08/01/22 0500 08/06/22 1733  Weight: (!) 137.5 kg 133.5 kg 134 kg    Intake/Output: I/O last 3 completed shifts: In: -  Out: 1380 [Urine:1380]   Intake/Output this shift:  No intake/output data recorded.  Physical Exam: General: NAD  Head: Normocephalic, atraumatic. Moist oral mucosal membranes  Eyes: Anicteric  Lungs:  Diminished in bases, [place] O2  Heart: Regular rate and rhythm  Abdomen:  Soft, nontender, obese  Extremities: No peripheral edema..  Dependent edema upper thighs and hips.  Neurologic: Alert, able to move all  four extremities  Skin: No lesions  Access: None    Basic Metabolic Panel: Recent Labs  Lab 08/04/22 0836 08/05/22 0407 08/06/22 0846 08/07/22 0753 08/08/22 0900 08/09/22 0752  NA 141 142 141 139 139 137  K 4.3 4.0 4.2 4.0 4.0 3.9  CL 95* 96* 98 94* 94* 96*  CO2 32 34* 31 31 34* 33*  GLUCOSE 98 81 78 83 88 73  BUN 69* 71* 74* 74* 72* 69*  CREATININE 2.23* 2.19* 2.26* 2.26* 2.17* 2.18*  CALCIUM 8.3* 8.1* 8.4* 8.3* 8.2* 8.4*  MG 2.5*  --  2.7*  --   --  2.5*  PHOS 4.5  --   --   --   --   --      Liver Function Tests: No results for input(s): "AST", "ALT", "ALKPHOS", "BILITOT", "PROT", "ALBUMIN" in the last 168 hours.  No results for input(s): "LIPASE", "AMYLASE" in the last 168 hours. No results for input(s): "AMMONIA" in the last 168 hours.  CBC: Recent Labs  Lab 08/03/22 0310 08/04/22 0836 08/06/22 0846 08/07/22 0758 08/08/22 0900  WBC 12.7* 9.9 10.3 9.4 10.1  NEUTROABS 10.5* 7.8* 8.5* 7.5 8.0*  HGB 10.7* 11.7* 11.9* 11.7* 11.0*  HCT 37.8 40.9 42.3 40.8 38.6  MCV 88.1 88.5 89.1 88.3 88.3  PLT 412* 446* 493* 468* 465*     Cardiac Enzymes: No results for input(s): "CKTOTAL", "CKMB", "CKMBINDEX", "TROPONINI" in the last 168 hours.  BNP: Invalid input(s): "POCBNP"  CBG: Recent Labs  Lab 08/08/22 0111 08/08/22 0757 08/08/22 1628 08/09/22 0003 08/09/22 0908  GLUCAP  109* 114* 117* 101* 36     Microbiology: Results for orders placed or performed during the hospital encounter of 08/05/2022  Blood culture (routine x 2)     Status: None   Collection Time: 08/02/2022 11:01 PM   Specimen: BLOOD  Result Value Ref Range Status   Specimen Description BLOOD BLOOD RIGHT WRIST  Final   Special Requests   Final    BOTTLES DRAWN AEROBIC AND ANAEROBIC Blood Culture results may not be optimal due to an inadequate volume of blood received in culture bottles   Culture   Final    NO GROWTH 5 DAYS Performed at [facility] [address], [place],  [place] [address]    Report Status 07/22/2022 FINAL  Final  Blood culture (routine x 2)     Status: None   Collection Time: 07/21/2022 11:01 PM   Specimen: BLOOD  Result Value Ref Range Status   Specimen Description BLOOD BLOOD LEFT HAND  Final   Special Requests IN PEDIATRIC BOTTLE Blood Culture adequate volume  Final   Culture   Final    NO GROWTH 5 DAYS Performed at [facility], [address] [place], [place] [address]    Report Status 07/22/2022 FINAL  Final  MRSA Next Gen by PCR, Nasal     Status: None   Collection Time: 07/18/22 10:11 AM   Specimen: Nasal Mucosa; Nasal Swab  Result Value Ref Range Status   MRSA by PCR Next Gen NOT DETECTED NOT DETECTED Final    Comment: (NOTE) The GeneXpert MRSA Assay (FDA approved for NASAL specimens only), is one component of a comprehensive MRSA colonization surveillance program. It is not intended to diagnose MRSA infection nor to guide or monitor treatment for MRSA infections. Test performance is not FDA approved in patients less than 68 years old. Performed at [facility], [place], [place] [address]   Respiratory (~20 pathogens) panel by PCR     Status: None   Collection Time: 07/18/22 11:07 AM   Specimen: Nasopharyngeal Swab; Respiratory  Result Value Ref Range Status   Adenovirus NOT DETECTED NOT DETECTED Final   Coronavirus 229E NOT DETECTED NOT DETECTED Final    Comment: (NOTE) The Coronavirus on the Respiratory Panel, DOES NOT test for the novel  Coronavirus (2019 nCoV)    Coronavirus HKU1 NOT DETECTED NOT DETECTED Final   Coronavirus NL63 NOT DETECTED NOT DETECTED Final   Coronavirus OC43 NOT DETECTED NOT DETECTED Final   Metapneumovirus NOT DETECTED NOT DETECTED Final   Rhinovirus / Enterovirus NOT DETECTED NOT DETECTED Final   Influenza A NOT DETECTED NOT DETECTED Final   Influenza B NOT DETECTED NOT DETECTED Final   Parainfluenza Virus 1 NOT DETECTED NOT DETECTED Final   Parainfluenza  Virus 2 NOT DETECTED NOT DETECTED Final   Parainfluenza Virus 3 NOT DETECTED NOT DETECTED Final   Parainfluenza Virus 4 NOT DETECTED NOT DETECTED Final   Respiratory Syncytial Virus NOT DETECTED NOT DETECTED Final   Bordetella pertussis NOT DETECTED NOT DETECTED Final   Bordetella Parapertussis NOT DETECTED NOT DETECTED Final   Chlamydophila pneumoniae NOT DETECTED NOT DETECTED Final   Mycoplasma pneumoniae NOT DETECTED NOT DETECTED Final    Comment: Performed at [facility], [place]. [address], [place], [place] [address]  Urine Culture     Status: None   Collection Time: 07/23/22  6:00 AM   Specimen: Urine, Clean Catch  Result Value Ref Range Status   Specimen Description  Final    URINE, CLEAN CATCH Performed at [facility], [address], [place], [place] [address]    Special Requests   Final    Normal Performed at [facility], [address] [place], [place] [address]    Culture   Final    NO GROWTH Performed at [facility], [place] [address], [place], [place] [address]    Report Status 07/24/2022 FINAL  Final    Coagulation Studies: No results for input(s): "LABPROT", "INR" in the last 72 hours.  Urinalysis: No results for input(s): "COLORURINE", "LABSPEC", "PHURINE", "GLUCOSEU", "HGBUR", "BILIRUBINUR", "KETONESUR", "PROTEINUR", "UROBILINOGEN", "NITRITE", "LEUKOCYTESUR" in the last 72 hours.  Invalid input(s): "APPERANCEUR"    Imaging: DG Chest Port 1 View  Result Date: 08/08/2022 CLINICAL DATA:  Shortness of breath EXAM: PORTABLE CHEST 1 VIEW COMPARISON:  08/06/2022 FINDINGS: Cardiac shadow is enlarged but stable. Increasing vascular congestion is noted. Small effusions are noted bilaterally. No pneumothorax is noted. No bony abnormality is seen. IMPRESSION: Worsening CHF. Electronically Signed   By: Inez Catalina M.D.   On: 08/08/2022 18:33     Medications:      amiodarone  200 mg Oral Daily   apixaban  5 mg Oral BID    atorvastatin  20 mg Oral Daily   budesonide (PULMICORT) nebulizer solution  0.25 mg Nebulization BID   buPROPion  150 mg Oral Daily   Chlorhexidine Gluconate Cloth  6 each Topical Daily   diltiazem  180 mg Oral Daily   feeding supplement  237 mL Oral BID BM   furosemide  20 mg Intravenous Once   hydrocerin   Topical BID   ipratropium-albuterol  3 mL Nebulization BID   isosorbide mononitrate  15 mg Oral BID   levothyroxine  250 mcg Oral Q0600   metoprolol tartrate  50 mg Oral BID   midodrine  10 mg Oral TID WC   mouth rinse  15 mL Mouth Rinse 4 times per day   pantoprazole  40 mg Oral Daily   rOPINIRole  1 mg Oral QHS   acetaminophen, ALPRAZolam, guaiFENesin, levalbuterol, liver oil-zinc oxide, LORazepam, metoprolol tartrate, ondansetron (ZOFRAN) IV, mouth rinse, polyethylene glycol, senna-docusate  Assessment/ Plan:  Ms. Madison Mcbride is a 67 y.o.  female  past medical history of chronic diastolic heart failure, morbid obesity, hypothyroidism, diabetes mellitus type 2, obstructive sleep apnea, lymphedema, and chronic kidney disease stage IIIa.  Patient presents to the emergency department with complaints of shortness of breath.  She has been admitted for Acute pulmonary edema (HCC) [J81.0] Atrial fibrillation with RVR (HCC) [I48.91] HCAP (healthcare-associated pneumonia) [J18.9] Acute on chronic respiratory failure with hypoxia and hypercapnia (HCC) [P38.25, J96.22]   Acute Kidney Injury on chronic kidney disease stage IIIa with baseline creatinine 1.35 and GFR of 54 on 07/16/22.  Acute kidney injury appears multifactorial due to hypotension and hemodynamic instability. Renal ultrasound negative for obstruction.    Renal function maintained stable.  Will order one-time dose IV furosemide 20 mg.  Will continue to monitor  Lab Results  Component Value Date   CREATININE 2.18 (H) 08/09/2022   CREATININE 2.17 (H) 08/08/2022   CREATININE 2.26 (H) 08/07/2022    Intake/Output  Summary (Last 24 hours) at 08/09/2022 1147 Last data filed at 08/09/2022 0500 Gross per 24 hour  Intake --  Output 325 ml  Net -325 ml    2.  Acute on chronic systolic heart failure.  Echo completed on 03/06/2022 shows EF 45 to 50%  with a mild to moderate MVR, TVR, and AVR.  Cardiology continues to follow.  Thoracentesis completed on 08/06/22 with 700 mL fluid removal.   Due to pulmonary edema noted on chest x-ray, will offer an additional dose of IV furosemide 20 mg today.  3. Anemia of chronic kidney disease  Normocytic Lab Results  Component Value Date   HGB 11.0 (L) 08/08/2022  Hemoglobin remains within desired goal.  Will monitor for now.  4.  Hypertension with chronic kidney disease. Receiving diltiazem, hydralazine, isosorbide, and metoprolol.  Torsemide held. Receiving midodrine '10mg'$  three times daily.  Blood pressure 107/69, acceptable for this patient.     LOS: [place] 1/28/202411:47 AM

## 2022-08-09 NOTE — Progress Notes (Signed)
[facility]    SUBJECTIVE: Patient states he feels reasonably well she has had some mild episodes of confusion denies any shortness of breath but complains of pressure in the lower abdomen she is feels like she is constipated as well but no worsening shortness of breath or dyspnea no fever no chest pain no palpitations or tachycardia   Vitals:   08/08/22 2344 08/09/22 0442 08/09/22 0800 08/09/22 0911  BP: 113/71 98/64  107/69  Pulse: 91 94 94 89  Resp: '19 18 20 18  '$ Temp: 98.2 F (36.8 C) 97.7 F (36.5 C)  97.7 F (36.5 C)  TempSrc: Oral Oral  Oral  SpO2: 98% 98% 99% 96%  Weight:      Height:         Intake/Output Summary (Last 24 hours) at 08/09/2022 1109 Last data filed at 08/09/2022 0500 Gross per 24 hour  Intake --  Output 325 ml  Net -325 ml      PHYSICAL EXAM  General: Well developed, well nourished, in no acute distress HEENT:  Normocephalic and atramatic Neck:  No JVD.  Lungs: Clear bilaterally to auscultation and percussion. Heart: HRRR . Normal S1 and S2 without gallops or murmurs.  Abdomen: Bowel sounds are positive, abdomen soft and non-tender  Msk:  Back normal, normal gait. Normal strength and tone for age. Extremities: No clubbing, cyanosis or edema.  Generalized weakness lower extremities Neuro: Alert and oriented X 3. Psych:  Good affect, responds appropriately   LABS: Basic Metabolic Panel: Recent Labs    08/08/22 0900 08/09/22 0752  NA 139 137  K 4.0 3.9  CL 94* 96*  CO2 34* 33*  GLUCOSE 88 73  BUN 72* 69*  CREATININE 2.17* 2.18*  CALCIUM 8.2* 8.4*  MG  --  2.5*   Liver Function Tests: No results for input(s): "AST", "ALT", "ALKPHOS", "BILITOT", "PROT", "ALBUMIN" in the last 72 hours. No results for input(s): "LIPASE", "AMYLASE" in the last 72 hours. CBC: Recent Labs    08/07/22 0758 08/08/22 0900  WBC 9.4 10.1  NEUTROABS 7.5 8.0*  HGB 11.7* 11.0*  HCT 40.8 38.6  MCV 88.3 88.3  PLT 468* 465*   Cardiac Enzymes: No results  for input(s): "CKTOTAL", "CKMB", "CKMBINDEX", "TROPONINI" in the last 72 hours. BNP: Invalid input(s): "POCBNP" D-Dimer: No results for input(s): "DDIMER" in the last 72 hours. Hemoglobin A1C: No results for input(s): "HGBA1C" in the last 72 hours. Fasting Lipid Panel: No results for input(s): "CHOL", "HDL", "LDLCALC", "TRIG", "CHOLHDL", "LDLDIRECT" in the last 72 hours. Thyroid Function Tests: No results for input(s): "TSH", "T4TOTAL", "T3FREE", "THYROIDAB" in the last 72 hours.  Invalid input(s): "FREET3" Anemia Panel: No results for input(s): "VITAMINB12", "FOLATE", "FERRITIN", "TIBC", "IRON", "RETICCTPCT" in the last 72 hours.  DG Chest Port 1 View  Result Date: 08/08/2022 CLINICAL DATA:  Shortness of breath EXAM: PORTABLE CHEST 1 VIEW COMPARISON:  08/06/2022 FINDINGS: Cardiac shadow is enlarged but stable. Increasing vascular congestion is noted. Small effusions are noted bilaterally. No pneumothorax is noted. No bony abnormality is seen. IMPRESSION: Worsening CHF. Electronically Signed   By: Inez Catalina M.D.   On: 08/08/2022 18:33     Echo mildly depressed left ventricular function EF around 25%  TELEMETRY: Sinus rhythm about 90 nonspecific findings:  ASSESSMENT AND PLAN:  Principal Problem:   Paroxysmal atrial fibrillation with RVR (HCC) Active Problems:   Type 2 diabetes mellitus with diabetic neuropathy, without long-term current use of insulin (HCC)   Hypothyroidism   Acute metabolic encephalopathy  HLD (hyperlipidemia)   Elevated troponin   OSA (obstructive sleep apnea)   Acute respiratory failure with hypoxia (HCC)   Acute on chronic systolic CHF (congestive heart failure) (HCC)   Acute on chronic respiratory failure with hypoxia and hypercapnia (HCC)   AKI (acute kidney injury) ([place])   Thoracic aortic aneurysm (HCC)   Hyperkalemia   Hypotension   Leukocytosis   Normocytic anemia   Acute pulmonary edema (HCC)   HAP (hospital-acquired pneumonia)   Goals of  care, counseling/discussion   Delirium   Bilateral pleural effusion    Plan Paroxysmal atrial fibrillation resolved currently in sinus rhythm continue anticoagulation continue rate control Chronic respiratory failure recommend supplemental oxygen consider increasing activity Obstructive sleep apnea recommend continue CPAP while asleep Hyperlipidemia continue statin therapy for lipid management Hospital-acquired pneumonia status post antibiotic therapy Generalized weakness and fatigue poor deconditioning recommend physical therapy up in bed to chair Acute on chronic renal insufficiency appreciate nephrology input maintain adequate hydration Ultimately patient may need rehab during improved strength and endurance and mobility   Yolonda Kida, MD 08/09/2022 11:09 AM

## 2022-08-10 ENCOUNTER — Inpatient Hospital Stay: Payer: Medicare Other

## 2022-08-10 DIAGNOSIS — I480 Paroxysmal atrial fibrillation: Secondary | ICD-10-CM | POA: Diagnosis not present

## 2022-08-10 DIAGNOSIS — Z7189 Other specified counseling: Secondary | ICD-10-CM | POA: Diagnosis not present

## 2022-08-10 LAB — GLUCOSE, CAPILLARY
Glucose-Capillary: 105 mg/dL — ABNORMAL HIGH (ref 70–99)
Glucose-Capillary: 105 mg/dL — ABNORMAL HIGH (ref 70–99)
Glucose-Capillary: 106 mg/dL — ABNORMAL HIGH (ref 70–99)
Glucose-Capillary: 113 mg/dL — ABNORMAL HIGH (ref 70–99)
Glucose-Capillary: 76 mg/dL (ref 70–99)

## 2022-08-10 LAB — CBC WITH DIFFERENTIAL/PLATELET
Abs Immature Granulocytes: 0.04 10*3/uL (ref 0.00–0.07)
Basophils Absolute: 0.1 10*3/uL (ref 0.0–0.1)
Basophils Relative: 1 %
Eosinophils Absolute: 0.1 10*3/uL (ref 0.0–0.5)
Eosinophils Relative: 1 %
HCT: 42.8 % (ref 36.0–46.0)
Hemoglobin: 12 g/dL (ref 12.0–15.0)
Immature Granulocytes: 0 %
Lymphocytes Relative: 10 %
Lymphs Abs: 0.9 10*3/uL (ref 0.7–4.0)
MCH: 25.3 pg — ABNORMAL LOW (ref 26.0–34.0)
MCHC: 28 g/dL — ABNORMAL LOW (ref 30.0–36.0)
MCV: 90.3 fL (ref 80.0–100.0)
Monocytes Absolute: 0.5 10*3/uL (ref 0.1–1.0)
Monocytes Relative: 6 %
Neutro Abs: 7.6 10*3/uL (ref 1.7–7.7)
Neutrophils Relative %: 82 %
Platelets: 424 10*3/uL — ABNORMAL HIGH (ref 150–400)
RBC: 4.74 MIL/uL (ref 3.87–5.11)
RDW: 22.7 % — ABNORMAL HIGH (ref 11.5–15.5)
Smear Review: NORMAL
WBC: 9.3 10*3/uL (ref 4.0–10.5)
nRBC: 0 % (ref 0.0–0.2)

## 2022-08-10 LAB — BASIC METABOLIC PANEL
Anion gap: 11 (ref 5–15)
BUN: 65 mg/dL — ABNORMAL HIGH (ref 8–23)
CO2: 34 mmol/L — ABNORMAL HIGH (ref 22–32)
Calcium: 8.8 mg/dL — ABNORMAL LOW (ref 8.9–10.3)
Chloride: 95 mmol/L — ABNORMAL LOW (ref 98–111)
Creatinine, Ser: 1.87 mg/dL — ABNORMAL HIGH (ref 0.44–1.00)
GFR, Estimated: 29 mL/min — ABNORMAL LOW (ref >60)
Glucose, Bld: 96 mg/dL (ref 70–99)
Potassium: 4.1 mmol/L (ref 3.5–5.1)
Sodium: 140 mmol/L (ref 135–145)

## 2022-08-10 LAB — CYTOLOGY - NON PAP

## 2022-08-10 LAB — MAGNESIUM: Magnesium: 2.5 mg/dL — ABNORMAL HIGH (ref 1.7–2.4)

## 2022-08-10 MED ORDER — FUROSEMIDE 10 MG/ML IJ SOLN
20.0000 mg | Freq: Once | INTRAMUSCULAR | Status: AC
  Administered 2022-08-10: 20 mg via INTRAVENOUS
  Filled 2022-08-10: qty 2

## 2022-08-10 MED ORDER — NITROGLYCERIN 0.4 MG SL SUBL
SUBLINGUAL_TABLET | SUBLINGUAL | Status: AC
  Filled 2022-08-10: qty 1

## 2022-08-10 MED ORDER — NITROGLYCERIN 0.4 MG SL SUBL: 0.4000 mg | SUBLINGUAL_TABLET | SUBLINGUAL | Status: DC | PRN

## 2022-08-10 NOTE — Progress Notes (Signed)
[facility] Card[facility]  Patient ID: Madison Mcbride MRN: [id number] DOB/AGE: 12-30-1955 67 y.o.   Admit date: 06/22/2022 Referring Physician Dr. Doristine Mango Primary Physician Dr. Emily Filbert Primary Cardiologist prev Dr. Nehemiah Massed Reason for Consultation tachycardia   HPI: Madison Mcbride is a 67yoF with a PMH of  HFrEF (prev 45-50% 02/2022), paroxysmal AF on eliquis, OSA on CPAP, HLD, DM2, CKD 3, hypothyroidism, morbid obesity with OHS who presented to [facility] ED 06/15/2022 from a skilled nursing facility out of concern for tachycardia, heart rates on admission were in the 130s, Presented with leukocytosis to 12.6K, UA concerning for cystitis. She initially met sepsis criteria, started on IV meropenem for treatment of cystitis due to history of ESBL and IV diltiazem for AF RVR.  Discharge back to SNF was planned to the afternoon of 07/15/2021, but the patient was tachycardic, and the SNF refused to accept her.  Cardiology was consulted for further assistance with her atrial fibrillation, which is well controlled.  She has completed treatment for HAP, hospital course complicated by acute encephalopathy felt to be related to delirium, AKI for which nephrology is following, and severe deconditioning.  Interval history: - breathing feels ok today. Noted some confusion overnight, husband concerned about another UTI, UA pending. Query hypercarbia vs delirium - no chest pain, palpitations - HR stable in 90s in NSR, renal function slowly improving with restarting lasix    Vitals:   08/10/22 0053 08/10/22 0409 08/10/22 0700 08/10/22 0829  BP: 114/86 99/71  114/80  Pulse: 100 95  98  Resp: 16 18  (!) 26  Temp:  (!) 97.4 F (36.3 C)    TempSrc:  Axillary    SpO2: 95% 97% 100% 98%  Weight:      Height:        No intake or output data in the 24 hours ending 08/10/22 1042  PHYSICAL EXAM General: caucasian female, well nourished, in no acute distress.  Sitting upright in hospital bed on [place],  HEENT:   Normocephalic and atraumatic. Neck:   No JVD.  Lungs: normal respiratory effort on [place] oxygen. Decreased breath sounds throughout Heart: regular rate and rhythm . Normal S1 and S2 without gallops or murmurs.  Abdomen: Non-distended appearing with excess adiposity.  Msk: deconditioned  Extremities: Chronic appearing venous stasis hyperpigmentation without peripheral edema.  Neuro: Alert and oriented X 3. Psych:  Answers questions appropriately.    LABS: Basic Metabolic Panel: Recent Labs    08/08/22 0900 08/09/22 0752  NA 139 137  K 4.0 3.9  CL 94* 96*  CO2 34* 33*  GLUCOSE 88 73  BUN 72* 69*  CREATININE 2.17* 2.18*  CALCIUM 8.2* 8.4*  MG  --  2.5*    Liver Function Tests: No results for input(s): "AST", "ALT", "ALKPHOS", "BILITOT", "PROT", "ALBUMIN" in the last 72 hours. No results for input(s): "LIPASE", "AMYLASE" in the last 72 hours. CBC: Recent Labs    08/08/22 0900  WBC 10.1  NEUTROABS 8.0*  HGB 11.0*  HCT 38.6  MCV 88.3  PLT 465*    Cardiac Enzymes: No results for input(s): "CKTOTAL", "CKMB", "CKMBINDEX", "TROPONINI" in the last 72 hours. BNP: Invalid input(s): "POCBNP" D-Dimer: No results for input(s): "DDIMER" in the last 72 hours. Hemoglobin A1C: No results for input(s): "HGBA1C" in the last 72 hours. Fasting Lipid Panel: No results for input(s): "CHOL", "HDL", "LDLCALC", "TRIG", "CHOLHDL", "LDLDIRECT" in the last 72 hours. Thyroid Function Tests: No results for input(s): "TSH", "T4TOTAL", "T3FREE", "THYROIDAB" in the last 72  hours.  Invalid input(s): "FREET3" Anemia Panel: No results for input(s): "VITAMINB12", "FOLATE", "FERRITIN", "TIBC", "IRON", "RETICCTPCT" in the last 72 hours.  DG Chest Port 1 View  Result Date: 08/08/2022 CLINICAL DATA:  Shortness of breath EXAM: PORTABLE CHEST 1 VIEW COMPARISON:  08/06/2022 FINDINGS: Cardiac shadow is enlarged but stable. Increasing vascular congestion is noted. Small effusions are noted bilaterally. No  pneumothorax is noted. No bony abnormality is seen. IMPRESSION: Worsening CHF. Electronically Signed   By: Inez Catalina M.D.   On: 08/08/2022 18:33     Echo EF 25-30% 03/06/2022  TELEMETRY: AF overnight, rate 70s-80s, currently NSR 90s  ASSESSMENT AND PLAN:  Principal Problem:   Paroxysmal atrial fibrillation with RVR (HCC) Active Problems:   Type 2 diabetes mellitus with diabetic neuropathy, without long-term current use of insulin (HCC)   Hypothyroidism   Acute metabolic encephalopathy   HLD (hyperlipidemia)   Elevated troponin   OSA (obstructive sleep apnea)   Acute respiratory failure with hypoxia (HCC)   Acute on chronic systolic CHF (congestive heart failure) (HCC)   Acute on chronic respiratory failure with hypoxia and hypercapnia (HCC)   AKI (acute kidney injury) ([place])   Thoracic aortic aneurysm (HCC)   Hyperkalemia   Hypotension   Leukocytosis   Normocytic anemia   Acute pulmonary edema (HCC)   HAP (hospital-acquired pneumonia)   Goals of care, counseling/discussion   Delirium   Bilateral pleural effusion    1.  Atrial flutter with RVR, on Eliquis for stroke prevention, s/p amiodarone load, currently on cardizem CD 180 daily, metoprolol tartrate 50 mg twice daily, amiodarone 200 mg once daily 2.  Acute on chronic systolic congestive heart failure, HFrEF, EF 25-30%/20 11/2021, on torsemide 20 mg daily and good medical management as tolerated (metoprolol to tartrate, isosorbide mononitrate) 3.  COPD 4.  Chronic kidney disease stage IIIa 5.  Pleural effusion, s/p successful thoracentesis 1/25 with 700cc aspirated with clinical improvement 6.  Morbid obesity, severely deconditioned   Recommendations   1.  Agree with current therapy 2.  agree with continuing IV lasix per nephrology 3.  Continue Eliquis for stroke prevention 4.  continue amiodarone '200mg'$  once daily 5.  Continue metoprolol tartrate 50 mg twice daily 6.  Continue Cardizem CD 180 mg daily for now during  acute illness, would recommend discontinuing after hospital discharge follow up with her reduced EF 7.  Agree with ongoing [place] discussions, appreciate palliative care assistance 8.  Needs to mobilize.   This patient's plan of care was discussed and created with Dr. Clayborn Bigness and he is in agreement.    Tristan Schroeder, PA-C 08/10/2022 10:42 AM
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
## 2022-08-10 NOTE — Progress Notes (Signed)
Occupational Therapy Treatment Patient Details Name: Madison Mcbride MRN: [id number] DOB: Nov 05, 1955 Today's Date: 08/10/2022   History of present illness 67 y.o. female with medical history significant of thyroid cancer, hypothyroidism, hyperlipidemia, Dm2, CKD stage 3, Pafib , CHFpEF, OSA on CPAP, was discharged to SNF and found to be tachycardic on intake and sent back to ED.   OT comments  Pt agreeable to OT/PT co-treatment to maximize safety and participation. Pt transferred to bedside recliner via hoyer lift this date. She required Max A +2 for rolling in bed to place lift sling and chuck pad. Pt fatigued quickly and required rest breaks t/o session. Pt also demonstrated anxiety with mobility and required frequent VC for pursed lip breathing 2/2 feelings of not being able to breathe. On 3.5L O2 via [place] t/o. SpO2 desatting to 85% with activity, however, quickly improved to >90% with rest. Once sitting up in recliner, pt engaged in grooming tasks with set up-supervision. Pt left in chair with all needs in reach. Pt is making progress toward goal completion. D/C recommendation remains appropriate. OT will continue to follow acutely.     Recommendations for follow up therapy are one component of a multi-disciplinary discharge planning process, led by the attending physician.  Recommendations may be updated based on patient status, additional functional criteria and insurance authorization.    Follow Up Recommendations  Skilled nursing-short term rehab (<3 hours/day)     Assistance Recommended at Discharge Frequent or constant Supervision/Assistance  Patient can return home with the following  Two people to help with walking and/or transfers;A lot of help with bathing/dressing/bathroom;Assistance with cooking/housework;Direct supervision/assist for medications management;Direct supervision/assist for financial management;Assist for transportation;Help with stairs or ramp for entrance    Equipment Recommendations  Other (comment) (defer to next venue of care)    Recommendations for Other Services      Precautions / Restrictions Precautions Precautions: Fall Restrictions Weight Bearing Restrictions: No       Mobility Bed Mobility Overal bed mobility: Needs Assistance Bed Mobility: Rolling Rolling: Max assist, +2 for physical assistance         General bed mobility comments: rolling performed multiple times for pad and hoyer lift pad placement, VC for hand placement. Very anxious with mobility needing multiple rest breaks throughout    Transfers Overall transfer level: Needs assistance                 General transfer comment: used hoyer lift for transfer from bed->chair. +2 for safety and monitoring vitals. Once seated in recliner, O2 sats improved to 95% on 3.5L of O2     Balance Overall balance assessment: Needs assistance Sitting-balance support: Feet supported, Bilateral upper extremity supported Sitting balance-Leahy Scale: Fair                                     ADL either performed or assessed with clinical judgement   ADL Overall ADL's : Needs assistance/impaired Eating/Feeding: Set up;Sitting   Grooming: Set up;Supervision/safety;Sitting;Wash/dry face;Brushing hair               Lower Body Dressing: Maximal assistance;Sitting/lateral leans Lower Body Dressing Details (indicate cue type and reason): socks                    Extremity/Trunk Assessment Upper Extremity Assessment Upper Extremity Assessment: Generalized weakness   Lower Extremity Assessment Lower Extremity Assessment: Generalized weakness   Cervical /  Trunk Assessment Cervical / Trunk Assessment: Normal    Vision Baseline Vision/History: 1 Wears glasses Patient Visual Report: No change from baseline     Perception     Praxis      Cognition Arousal/Alertness: Awake/alert Behavior During Therapy: WFL for tasks  assessed/performed Overall Cognitive Status: Within Functional Limits for tasks assessed                                 General Comments: anxious with all mobility, constant VC for pursed lip breathing        Exercises      Shoulder Instructions       General Comments      Pertinent Vitals/ Pain       Pain Assessment Pain Assessment: No/denies pain  Home Living                                          Prior Functioning/Environment              Frequency  Min 2X/week        Progress Toward Goals  OT Goals(current goals can now be found in the care plan section)  Progress towards OT goals: Progressing toward goals  Acute Rehab OT Goals Patient Stated Goal: To feel better OT Goal Formulation: With patient Time For Goal Achievement: 08/21/22 Potential to Achieve Goals: Good  Plan Discharge plan remains appropriate;Frequency remains appropriate    Co-evaluation    PT/OT/SLP Co-Evaluation/Treatment: Yes Reason for Co-Treatment: For patient/therapist safety;To address functional/ADL transfers;Complexity of the patient's impairments (multi-system involvement) PT goals addressed during session: Mobility/safety with mobility OT goals addressed during session: ADL's and self-care      AM-PAC OT "6 Clicks" Daily Activity     Outcome Measure   Help from another person eating meals?: None Help from another person taking care of personal grooming?: A Little Help from another person toileting, which includes using toliet, bedpan, or urinal?: A Lot Help from another person bathing (including washing, rinsing, drying)?: A Lot Help from another person to put on and taking off regular upper body clothing?: A Little Help from another person to put on and taking off regular lower body clothing?: A Lot 6 Click Score: 16    End of Session Equipment Utilized During Treatment: Oxygen;Other (comment) (hoyer lift)  OT Visit Diagnosis:  Unsteadiness on feet (R26.81);Muscle weakness (generalized) (M62.81)   Activity Tolerance Patient tolerated treatment well;Patient limited by fatigue   Patient Left in chair;with call bell/phone within reach;with family/visitor present   Nurse Communication Mobility status        Time: 1610-9604 OT Time Calculation (min): 49 min  Charges: OT General Charges $OT Visit: 1 Visit OT Treatments $Self Care/Home Management : 8-22 mins  [facility] MS, OTR/L ascom [phone]  08/10/22, 5:44 PM

## 2022-08-10 NOTE — Progress Notes (Signed)
Physical Therapy Treatment Patient Details Name: Madison Mcbride MRN: [id number] DOB: January 07, 1956 Today's Date: 08/10/2022   History of Present Illness 67 y.o. female with medical history significant of thyroid cancer, hypothyroidism, hyperlipidemia, Dm2, CKD stage 3, Pafib , CHFpEF, OSA on CPAP, was discharged to SNF and found to be tachycardic on intake and sent back to ED.    PT Comments    Pt agreeable to session, performed co-treat with OT. Re-evaluated this date and new goals documented. Therapy staff assisted and monitored pt tolerance for transfer bed->chair via hoyer lift this afternoon. +2 for rolling, donning lift pad and chux pad. Has increased anxiety rolling due to O2 sats decreasing to 85%. Assist for repositioning for improved breathing. Once in recliner, O2 sats improve to 96% and pt able to tolerate sitting position. Discussed with RN staff on hoyer lift between surfaces and need for linen replacement in bed. Able to participate in limited seated there-ex. Will continue to progress as able.   Recommendations for follow up therapy are one component of a multi-disciplinary discharge planning process, led by the attending physician.  Recommendations may be updated based on patient status, additional functional criteria and insurance authorization.  Follow Up Recommendations  Skilled nursing-short term rehab (<3 hours/day) Can patient physically be transported by private vehicle: No   Assistance Recommended at Discharge Frequent or constant Supervision/Assistance  Patient can return home with the following Two people to help with walking and/or transfers;Help with stairs or ramp for entrance;Assist for transportation;Assistance with cooking/housework;Direct supervision/assist for medications management;Direct supervision/assist for financial management;Two people to help with bathing/dressing/bathroom   Equipment Recommendations  Wheelchair (measurements PT);Wheelchair cushion  (measurements PT);Hospital bed;BSC/3in1;Other (comment)    Recommendations for Other Services       Precautions / Restrictions Precautions Precautions: Fall Restrictions Weight Bearing Restrictions: No     Mobility  Bed Mobility Overal bed mobility: Needs Assistance Bed Mobility: Rolling Rolling: Max assist, +2 for physical assistance         General bed mobility comments: rolling performed multiple times for pad and hoyer lift pad placement. Very anxious with mobility needing multiple rest breaks throughout    Transfers Overall transfer level: Needs assistance                 General transfer comment: used hoyer lift for transfer from bed->chair. +2 for safety and monitoring vitals. ONce seated in recliner, O2 sats improved to 95% on 3.5L of O2    Ambulation/Gait               General Gait Details: unable   Stairs             Wheelchair Mobility    Modified Rankin (Stroke Patients Only)       Balance Overall balance assessment: Needs assistance Sitting-balance support: Feet supported, Bilateral upper extremity supported Sitting balance-Leahy Scale: Fair                                      Cognition Arousal/Alertness: Awake/alert Behavior During Therapy: WFL for tasks assessed/performed Overall Cognitive Status: Within Functional Limits for tasks assessed                                 General Comments: anxious with all mobility        Exercises Other Exercises Other Exercises: seated ther-ex in  chair including B LE AP and hip abd/add. 10 reps performed    General Comments        Pertinent Vitals/Pain Pain Assessment Pain Assessment: No/denies pain    Home Living                          Prior Function            PT Goals (current goals can now be found in the care plan section) Acute Rehab PT Goals Patient Stated Goal: to go back to rehab PT Goal Formulation: With  patient Time For Goal Achievement: 08/07/22 Potential to Achieve Goals: Fair Progress towards PT goals: Progressing toward goals    Frequency    Min 2X/week      PT Plan Current plan remains appropriate    Co-evaluation PT/OT/SLP Co-Evaluation/Treatment: Yes Reason for Co-Treatment: Complexity of the patient's impairments (multi-system involvement) PT goals addressed during session: Mobility/safety with mobility OT goals addressed during session: ADL's and self-care      AM-PAC PT "6 Clicks" Mobility   Outcome Measure  Help needed turning from your back to your side while in a flat bed without using bedrails?: A Lot Help needed moving from lying on your back to sitting on the side of a flat bed without using bedrails?: A Lot Help needed moving to and from a bed to a chair (including a wheelchair)?: Total Help needed standing up from a chair using your arms (e.g., wheelchair or bedside chair)?: Total Help needed to walk in hospital room?: Total Help needed climbing 3-5 steps with a railing? : Total 6 Click Score: 8    End of Session Equipment Utilized During Treatment: Oxygen Activity Tolerance: Patient tolerated treatment well Patient left: in chair Nurse Communication: Need for lift equipment;Mobility status PT Visit Diagnosis: Unsteadiness on feet (R26.81);Muscle weakness (generalized) (M62.81)     Time: 2831-5176 PT Time Calculation (min) (ACUTE ONLY): 45 min  Charges:  $Therapeutic Exercise: 8-22 mins $Therapeutic Activity: 8-22 mins                     Greggory Stallion, PT, DPT, GCS [phone]    Ichael Pullara 08/10/2022, 4:39 PM

## 2022-08-10 NOTE — Progress Notes (Addendum)
Daily Progress Note   Patient Name: Madison Mcbride       Date: 08/10/2022 DOB: [birth year]  Age: 67 y.o. MRN#: [id number] Attending Physician: Sidney Ace, MD Primary Care Physician: Rusty Aus, MD Admit Date: 08/07/2022  Reason for Consultation/Follow-up: Establishing goals of care  Subjective: Notes and labs reviewed.  In to see patient.  She states her breathing has improved and she is feeling better.  It appears she is eating bites of her breakfast at most.  She states she is not hungry.  She discusses that her sons were able to come and visit over the weekend.  Began to broach goals of care as thus far, for different reasons, patient has not verbalized/has not been able to verbalize her personal feelings or wishes on care moving forward.    Inquired her thoughts on a feeding as her intake has been very poor, due to lack of hunger and shortness of breath.  She jokes that she has been trying to lose weight.  She states that she would not be happy with the prospect of a feeding tube, but is unable to state if she would allow one to be placed to assist with nutrition, or if she would not. Patient states that she does not want to have these conversations right now as they make her anxious.  She states that she understands the purpose of the conversation is to ensure that we are following her wishes and honoring her goals.   She states she understands that from a medical standpoint her prognosis is poor.  She has voiced being grateful for our visits, and states she just does not want to have the conversation regarding limits or boundaries to care.  In conversations with patient and spouse, we have discussed diagnoses, prognosis, consideration of pain and suffering, and acceptable  quality of life in the future.  We have discussed continued aggressive route as well as a comfort approach.  As patient does not wish to discuss goals of care, PMT will sign off at this time.  Please reconsult if patient desires to speak with our team.    Length of Stay: 24  Current Medications: Scheduled Meds:   amiodarone  200 mg Oral Daily   apixaban  5 mg Oral BID   atorvastatin  20 mg Oral Daily  budesonide (PULMICORT) nebulizer solution  0.25 mg Nebulization BID   buPROPion  150 mg Oral Daily   Chlorhexidine Gluconate Cloth  6 each Topical Daily   diltiazem  180 mg Oral Daily   feeding supplement  237 mL Oral BID BM   hydrocerin   Topical BID   ipratropium-albuterol  3 mL Nebulization BID   isosorbide mononitrate  15 mg Oral BID   levothyroxine  250 mcg Oral Q0600   metoprolol tartrate  50 mg Oral BID   midodrine  10 mg Oral TID WC   mouth rinse  15 mL Mouth Rinse 4 times per day   pantoprazole  40 mg Oral Daily   rOPINIRole  1 mg Oral QHS    Continuous Infusions:   PRN Meds: acetaminophen, ALPRAZolam, guaiFENesin, levalbuterol, liver oil-zinc oxide, LORazepam, metoprolol tartrate, ondansetron (ZOFRAN) IV, mouth rinse, polyethylene glycol, senna-docusate  Physical Exam Pulmonary:     Effort: Pulmonary effort is normal.  Neurological:     Mental Status: She is alert.             Vital Signs: BP 120/84 (BP Location: Right Arm)   Pulse 96   Temp 97.9 F (36.6 C) (Oral)   Resp (!) 24   Ht '5\' 5"'$  (1.651 m)   Wt 134 kg   SpO2 92%   BMI 49.17 kg/m  SpO2: SpO2: 92 % O2 Device: O2 Device: Nasal Cannula O2 Flow Rate: O2 Flow Rate (L/min): 4 L/min  Intake/output summary: No intake or output data in the 24 hours ending 08/10/22 1310 LBM: Last BM Date : 08/08/22 Baseline Weight: Weight: (!) 136.5 kg Most recent weight: Weight: 134 kg        Patient Active Problem List   Diagnosis Date Noted   Bilateral pleural effusion 08/03/2022   Delirium 07/31/2022    Goals of care, counseling/discussion 07/28/2022   Acute pulmonary edema ([place]) 07/20/2022   HAP (hospital-acquired pneumonia) 07/20/2022   Chronic atrial fibrillation with RVR ([place]) 07/20/2022   Thoracic aortic aneurysm ([place]) 08/01/2022   Hyperkalemia 07/28/2022   Hypotension 07/30/2022   Leukocytosis 07/16/2022   Normocytic anemia 08/02/2022   Paroxysmal atrial fibrillation with RVR ([place]) 07/16/2022   SIRS (systemic inflammatory response syndrome) ([place]) 07/15/2022   Tachycardia 07/15/2022   AKI (acute kidney injury) ([place]) 07/15/2022   UTI (urinary tract infection) 07/03/2022   HTN (hypertension) 06/29/2022   Type II diabetes mellitus with renal manifestations ([place]) 07/08/2022   Depression with anxiety 06/17/2022   Iron deficiency anemia 06/24/2022   Sepsis ([place]) 06/28/2022   Myocardial injury 07/02/2022   Atrial fibrillation with RVR ([place]) 06/24/2022   Acute kidney injury ([place]) 05/20/2022   Chronic hypoxic respiratory failure, on home oxygen therapy ([place]) 05/20/2022   Congestive heart failure (CHF) ([place]) 05/20/2022   Microcytic anemia 05/20/2022   Chronic venous stasis dermatitis of both lower extremities 05/20/2022   Pressure ulcer of buttock 05/20/2022   Inflammatory arthritis 05/20/2022   Sinus bradycardia 05/20/2022   Dysuria 05/20/2022   PAF (paroxysmal atrial fibrillation) ([place]) 03/08/2022   Acute on chronic respiratory failure with hypoxia and hypercapnia ([place]) 03/05/2022   Effusion of knee joint, left 03/22/2021   Acute on chronic systolic CHF (congestive heart failure) ([place]) 03/19/2021   Acute on chronic heart failure ([place]) 03/14/2021   Acute respiratory failure with hypoxia ([place]) 03/14/2021   Diabetes mellitus without complication ([place]) 18/56/3149   HLD (hyperlipidemia) 04/09/2020   Depression 04/09/2020   Elevated troponin 04/09/2020   OSA (obstructive sleep apnea)  04/09/2020   Peripheral polyneuropathy 02/29/2020   Polyclonal gammopathy determined by serum  protein electrophoresis 02/29/2020   Lumbar radiculopathy, acute    Acute midline low back pain without sciatica    Type 2 diabetes mellitus with diabetic neuropathy, without long-term current use of insulin ([place]) 02/21/2020   Hypothyroidism 02/21/2020   Morbid obesity with BMI of 50.0-59.9, adult ([place]) 02/21/2020   CAP (community acquired pneumonia) 02/21/2020   Bilateral cellulitis of lower leg 02/21/2020   Acute respiratory failure with hypoxia and hypercapnia ([place]) 02/21/2020   Acute renal failure superimposed on stage 3a chronic kidney disease ([place]) 13/02/6577   Acute metabolic encephalopathy 46/96/2952   Chronic kidney disease, stage 3a ([place]) 01/27/2020   Transaminitis 01/27/2020   Lymphedema of both lower extremities 08/21/2016   Benign essential hypertension 05/18/2016    Palliative Care Assessment & Plan    Recommendations/Plan: PMT will sign off as patient does not wish to discuss goals of care.  Please reconsult if patient desires to speak with a member of our team regarding goals of care.  Code Status:    Code Status Orders  (From admission, onward)           Start     Ordered   08/04/2022 2107  Full code  Continuous       Question:  By:  Answer:  Default: patient does not have capacity for decision making, no surrogate or prior directive available   07/31/2022 2107           Code Status History     Date Active Date Inactive Code Status Order ID Comments User Context   06/27/2022 0610 07/24/2022 1503 Full Code [id number]  Mansy, Arvella Merles, MD ED   05/20/2022 1733 05/29/2022 2115 Full Code [id number]  Jose Persia, MD ED   03/05/2022 2257 03/19/2022 1729 Full Code [id number]  Lang Snow, NP ED   07/15/2021 2331 07/29/2021 2207 Full Code [id number]  Cox, Amy N, DO ED   03/14/2021 2357 03/24/2021 2129 Full Code [id number]  Clarnce Flock, MD ED   04/09/2020 1838 04/19/2020 0100 Full Code [id number]  Ivor Costa, MD Inpatient   02/21/2020 0600 03/01/2020 2155 Full  Code [id number]  Athena Masse, MD ED      Advance Directive Documentation    Flowsheet Row Most Recent Value  Type of Advance Directive Healthcare Power of Attorney  Pre-existing out of facility DNR order (yellow form or pink MOST form) --  "MOST" Form in Place? --       Prognosis:  < 6 months    Thank you for allowing the Palliative Medicine Team to assist in the care of this patient.  Asencion Gowda, NP  Please contact Palliative Medicine Team phone at [phone] for questions and concerns.

## 2022-08-10 NOTE — Progress Notes (Signed)
PROGRESS NOTE    Madison Mcbride  PYP:[id number] DOB: 1955-08-01 DOA: 07/19/2022 PCP: Rusty Aus, MD    Brief Narrative:  67 y.o. female with medical history significant for morbid obesity, chronic diastolic CHF, hypothyroidism, type II DM, obstructive sleep apnea, lymphedema, CKD stage IIIa, OSA, chronic hypoxic and hypercapnic respiratory failure on 2 L/min oxygen and trilogy machine at night.  [facility] to [facility] ED [facility]17[facility]2022 from a skilled nursing facility out of concern for tachycardia, heart rates on admission were in the 130s, Presented with leukocytosis to 12.6K, UA concerning for cystitis. She initially met sepsis criteria, started on IV meropenem for treatment of cystitis due to history of ESBL and IV diltiazem for AF RVR.  Discharge back to SNF was planned to the afternoon of 07/15/2021, but the patient was tachycardic, and the SNF refused to accept her.   Respiratory status has been very tenuous during this admission.  She is constantly short of breath.  Conversational dyspnea even with minimal exertion.  Even eating causes her dyspnea worsen.  We attempted thoracentesis on 1/24.  Unfortunately patient was unable to tolerate.  After premedication patient was able to tolerate procedure on 1/25.  700 cc fluid removed.   Assessment & Plan:   Principal Problem:   Paroxysmal atrial fibrillation with RVR (HCC) Active Problems:   Acute on chronic respiratory failure with hypoxia and hypercapnia (HCC)   Acute metabolic encephalopathy   Acute on chronic systolic CHF (congestive heart failure) (HCC)   HLD (hyperlipidemia)   OSA (obstructive sleep apnea)   Type 2 diabetes mellitus with diabetic neuropathy, without long-term current use of insulin (HCC)   Elevated troponin   Hypothyroidism   Acute respiratory failure with hypoxia (HCC)   AKI (acute kidney injury) ([place])   Thoracic aortic aneurysm (HCC)   Hyperkalemia   Hypotension   Leukocytosis   Normocytic anemia   Acute  pulmonary edema (HCC)   HAP (hospital-acquired pneumonia)   Goals of care, counseling/discussion   Delirium   Bilateral pleural effusion  Acute on chronic systolic CHF, pulmonary hypertension: Status post low-dose Lasix challenge.  20 mg p.o. x 1 given on 1/26, dose repeated on 1/28.  Creatinine improving.  Patient may benefit from daily low-dose diuretic.  Further nephrology recommendations     AKI on CKD stage IIIa, hyperkalemia: Creatinine improved after Lasix challenge.  Nephrology following     Acute on chronic hypoxic and hypercapnic respiratory failure, COPD, OHS, OSA: Oxygen requirement has gone up from baseline.  Was requiring maximum of 6 L via nasal cannula.  Postthoracentesis down to 4 L.  BiPAP at night.  Baseline requirement of prior to hospitalization was 2 L.  Wean oxygen aggressively.   Bilateral pleural effusion, R>L: Right thoracentesis was ordered on 08/03/2022.  Patient was able to tolerate procedure with premedication on 1/25.  700 cc of fluid removed.  Atrial fibrillation with RVR: Overall, heart rate is better.  She is in mild sinus tachycardia.  Continue amiodarone, Cardizem and metoprolol per cardiologist.  She is on Eliquis for stroke prophylaxis.  [facility] follow-up     Hypotension: Continue midodrine     Acute toxic metabolic encephalopathy, delirium: Improved.  Completed 3-day course of Haldol on 08/02/2022.  CT head obtained on 08/01/2022 did not show any acute abnormality.  Mental status appears to be baseline   Recent hypoglycemia, hypokalemia: Resolved     Pneumonia: Completed antibiotics on 07/25/2022.   Other comorbidities include type II DM, restless leg syndrome, hypothyroidism,  chronic venous stasis, anxiety, depression, chronic pain   DVT prophylaxis: Eliquis Code Status: Full Family Communication: Close friend at bedside 1/24 Disposition Plan: Status is: Inpatient Remains inpatient appropriate because: Multiple acute medical  issues.  Disposition plan unknown at this time.   Level of care: Telemetry Cardiac  Consultants:  Cardiology Nephrology Palliative care  Procedures:  None  Antimicrobials: None   Subjective: Seen and examined.  More upset this morning.  Distrustful of hospital staff.  Objective: Vitals:   08/10/22 0053 08/10/22 0409 08/10/22 0700 08/10/22 0829  BP: 114/86 99/71  114/80  Pulse: 100 95  98  Resp: 16 18  (!) 26  Temp:  (!) 97.4 F (36.3 C)    TempSrc:  Axillary    SpO2: 95% 97% 100% 98%  Weight:      Height:       No intake or output data in the 24 hours ending 08/10/22 1220   Filed Weights   07/28/22 0904 08/01/22 0500 08/06/22 1733  Weight: (!) 137.5 kg 133.5 kg 134 kg    Examination:  General exam: Appears fatigued.  Appears chronically ill Respiratory system: Diminished lung sounds at bases.  No wheeze.  Normal work of breathing.  4 L Cardiovascular system: S1-S2, tachycardic, no appreciable murmurs, Gastrointestinal system: Obese, soft, NT/ND, normal bowel sounds Central nervous system: Alert and oriented. No focal neurological deficits. Extremities: Symmetrically decreased power bilaterally Skin: No rashes, lesions or ulcers Psychiatry: Judgement and insight appear impaired. Mood & affect flattened.     Data Reviewed: I have personally reviewed following labs and imaging studies  CBC: Recent Labs  Lab 08/04/22 0836 08/06/22 0846 08/07/22 0758 08/08/22 0900 08/10/22 1021  WBC 9.9 10.3 9.4 10.1 9.3  NEUTROABS 7.8* 8.5* 7.5 8.0* 7.6  HGB 11.7* 11.9* 11.7* 11.0* 12.0  HCT 40.9 42.3 40.8 38.6 42.8  MCV 88.5 89.1 88.3 88.3 90.3  PLT 446* 493* 468* 465* 379*   Basic Metabolic Panel: Recent Labs  Lab 08/04/22 0836 08/05/22 0407 08/06/22 0846 08/07/22 0753 08/08/22 0900 08/09/22 0752 08/10/22 1021  NA 141   < > 141 139 139 137 140  K 4.3   < > 4.2 4.0 4.0 3.9 4.1  CL 95*   < > 98 94* 94* 96* 95*  CO2 32   < > 31 31 34* 33* 34*  GLUCOSE 98    < > 78 83 88 73 96  BUN 69*   < > 74* 74* 72* 69* 65*  CREATININE 2.23*   < > 2.26* 2.26* 2.17* 2.18* 1.87*  CALCIUM 8.3*   < > 8.4* 8.3* 8.2* 8.4* 8.8*  MG 2.5*  --  2.7*  --   --  2.5* 2.5*  PHOS 4.5  --   --   --   --   --   --    < > = values in this interval not displayed.   GFR: Estimated Creatinine Clearance: 39.9 mL/min (A) (by C-G formula based on SCr of 1.87 mg/dL (H)). Liver Function Tests: No results for input(s): "AST", "ALT", "ALKPHOS", "BILITOT", "PROT", "ALBUMIN" in the last 168 hours. No results for input(s): "LIPASE", "AMYLASE" in the last 168 hours. No results for input(s): "AMMONIA" in the last 168 hours. Coagulation Profile: No results for input(s): "INR", "PROTIME" in the last 168 hours. Cardiac Enzymes: No results for input(s): "CKTOTAL", "CKMB", "CKMBINDEX", "TROPONINI" in the last 168 hours. BNP (last 3 results) No results for input(s): "PROBNP" in the last 8760 hours. HbA1C: No  results for input(s): "HGBA1C" in the last 72 hours. CBG: Recent Labs  Lab 08/09/22 1222 08/09/22 1539 08/09/22 2053 08/10/22 0057 08/10/22 0836  GLUCAP 104* 122* 99 106* 76   Lipid Profile: No results for input(s): "CHOL", "HDL", "LDLCALC", "TRIG", "CHOLHDL", "LDLDIRECT" in the last 72 hours. Thyroid Function Tests: No results for input(s): "TSH", "T4TOTAL", "FREET4", "T3FREE", "THYROIDAB" in the last 72 hours. Anemia Panel: No results for input(s): "VITAMINB12", "FOLATE", "FERRITIN", "TIBC", "IRON", "RETICCTPCT" in the last 72 hours. Sepsis Labs: No results for input(s): "PROCALCITON", "LATICACIDVEN" in the last 168 hours.  No results found for this or any previous visit (from the past 240 hour(s)).       Radiology Studies: DG Chest Port 1 View  Result Date: 08/08/2022 CLINICAL DATA:  Shortness of breath EXAM: PORTABLE CHEST 1 VIEW COMPARISON:  08/06/2022 FINDINGS: Cardiac shadow is enlarged but stable. Increasing vascular congestion is noted. Small effusions are  noted bilaterally. No pneumothorax is noted. No bony abnormality is seen. IMPRESSION: Worsening CHF. Electronically Signed   By: Inez Catalina M.D.   On: 08/08/2022 18:33        Scheduled Meds:  amiodarone  200 mg Oral Daily   apixaban  5 mg Oral BID   atorvastatin  20 mg Oral Daily   budesonide (PULMICORT) nebulizer solution  0.25 mg Nebulization BID   buPROPion  150 mg Oral Daily   Chlorhexidine Gluconate Cloth  6 each Topical Daily   diltiazem  180 mg Oral Daily   feeding supplement  237 mL Oral BID BM   hydrocerin   Topical BID   ipratropium-albuterol  3 mL Nebulization BID   isosorbide mononitrate  15 mg Oral BID   levothyroxine  250 mcg Oral Q0600   metoprolol tartrate  50 mg Oral BID   midodrine  10 mg Oral TID WC   mouth rinse  15 mL Mouth Rinse 4 times per day   pantoprazole  40 mg Oral Daily   rOPINIRole  1 mg Oral QHS   Continuous Infusions:   LOS: 24 days    Sidney Ace, MD [facility]   If 7PM-7AM, please contact night-coverage  08/10/2022, 12:20 PM

## 2022-08-10 NOTE — Plan of Care (Signed)

## 2022-08-11 DIAGNOSIS — I480 Paroxysmal atrial fibrillation: Secondary | ICD-10-CM | POA: Diagnosis not present

## 2022-08-11 LAB — BASIC METABOLIC PANEL
Anion gap: 8 (ref 5–15)
BUN: 67 mg/dL — ABNORMAL HIGH (ref 8–23)
CO2: 35 mmol/L — ABNORMAL HIGH (ref 22–32)
Calcium: 8.3 mg/dL — ABNORMAL LOW (ref 8.9–10.3)
Chloride: 97 mmol/L — ABNORMAL LOW (ref 98–111)
Creatinine, Ser: 1.8 mg/dL — ABNORMAL HIGH (ref 0.44–1.00)
GFR, Estimated: 30 mL/min — ABNORMAL LOW (ref >60)
Glucose, Bld: 85 mg/dL (ref 70–99)
Potassium: 3.8 mmol/L (ref 3.5–5.1)
Sodium: 140 mmol/L (ref 135–145)

## 2022-08-11 LAB — TROPONIN I (HIGH SENSITIVITY)
Troponin I (High Sensitivity): 15 ng/L (ref <18)
Troponin I (High Sensitivity): 16 ng/L (ref <18)

## 2022-08-11 LAB — BLOOD GAS, VENOUS
Acid-Base Excess: 12.3 mmol/L — ABNORMAL HIGH (ref 0.0–2.0)
Bicarbonate: 42.2 mmol/L — ABNORMAL HIGH (ref 20.0–28.0)
O2 Saturation: 80.8 %
Patient temperature: 37
pCO2, Ven: 82 mmHg (ref 44–60)
pH, Ven: 7.32 (ref 7.25–7.43)
pO2, Ven: 53 mmHg — ABNORMAL HIGH (ref 32–45)

## 2022-08-11 LAB — GLUCOSE, CAPILLARY
Glucose-Capillary: 121 mg/dL — ABNORMAL HIGH (ref 70–99)
Glucose-Capillary: 68 mg/dL — ABNORMAL LOW (ref 70–99)
Glucose-Capillary: 86 mg/dL (ref 70–99)
Glucose-Capillary: 93 mg/dL (ref 70–99)
Glucose-Capillary: 98 mg/dL (ref 70–99)

## 2022-08-11 MED ORDER — SENNOSIDES-DOCUSATE SODIUM 8.6-50 MG PO TABS
2.0000 | ORAL_TABLET | Freq: Once | ORAL | Status: AC
  Administered 2022-08-11: 2 via ORAL
  Filled 2022-08-11: qty 2

## 2022-08-11 MED ORDER — FUROSEMIDE 10 MG/ML IJ SOLN
20.0000 mg | Freq: Once | INTRAMUSCULAR | Status: AC
  Administered 2022-08-11: 20 mg via INTRAVENOUS
  Filled 2022-08-11: qty 2

## 2022-08-11 NOTE — Progress Notes (Signed)
PROGRESS NOTE    Madison Mcbride  BFX:[id number] DOB: December 14, 1955 DOA: 07/18/2022 PCP: Rusty Aus, MD    Brief Narrative:  67 y.o. female with medical history significant for morbid obesity, chronic diastolic CHF, hypothyroidism, type II DM, obstructive sleep apnea, lymphedema, CKD stage IIIa, OSA, chronic hypoxic and hypercapnic respiratory failure on 2 L/min oxygen and trilogy machine at night.  Presented to [facility] ED 06/27/2022 from a skilled nursing facility out of concern for tachycardia, heart rates on admission were in the 130s, Presented with leukocytosis to 12.6K, UA concerning for cystitis. She initially met sepsis criteria, started on IV meropenem for treatment of cystitis due to history of ESBL and IV diltiazem for AF RVR.  Discharge back to SNF was planned to the afternoon of 07/15/2021, but the patient was tachycardic, and the SNF refused to accept her.   Respiratory status has been very tenuous during this admission.  She is constantly short of breath.  Conversational dyspnea even with minimal exertion.  Even eating causes her dyspnea worsen.  We attempted thoracentesis on 1/24.  Unfortunately patient was unable to tolerate.  After premedication patient was able to tolerate procedure on 1/25.  700 cc fluid removed.  1/30: Rapid response called late last night for chest pain and confusion.  Seem to be more hospital-acquired delirium.  Patient was confused and apparently thought her husband might be cheating on her.  Workup negative.  Chest pain improved.  Remains on 4 L nasal cannula.   Assessment & Plan:   Principal Problem:   Paroxysmal atrial fibrillation with RVR (HCC) Active Problems:   Acute on chronic respiratory failure with hypoxia and hypercapnia (HCC)   Acute metabolic encephalopathy   Acute on chronic systolic CHF (congestive heart failure) (HCC)   HLD (hyperlipidemia)   OSA (obstructive sleep apnea)   Type 2 diabetes mellitus with diabetic neuropathy, without  long-term current use of insulin (HCC)   Elevated troponin   Hypothyroidism   Acute respiratory failure with hypoxia (HCC)   AKI (acute kidney injury) ([place])   Thoracic aortic aneurysm (HCC)   Hyperkalemia   Hypotension   Leukocytosis   Normocytic anemia   Acute pulmonary edema (HCC)   HAP (hospital-acquired pneumonia)   Goals of care, counseling/discussion   Delirium   Bilateral pleural effusion  Acute on chronic systolic CHF, pulmonary hypertension  Status post low-dose Lasix challenge.  20 mg p.o. x 1 given on 1/26, dose repeated on 1/28.  Creatinine improving.  At this point just dosing 20 mg IV Lasix after confirming stability of kidney function.  Would likely benefit from some more diuretic use in order to wean from supplemental oxygen.    AKI on CKD stage IIIa, hyperkalemia  Creatinine improved after Lasix challenge.  Will attempt 20 mg IV Lasix again today  Acute on chronic hypoxic and hypercapnic respiratory failure, COPD, OHS, OSA Oxygen requirement has gone up from baseline.  Was requiring maximum of 6 L via nasal cannula.  Postthoracentesis down to 4 L.  BiPAP at night.  Baseline requirement of prior to hospitalization was 2 L.  Will attempt to wean back to home right prior to discharge.     Bilateral pleural effusion, R>L  Right thoracentesis was ordered on 08/03/2022.  Patient was able to tolerate procedure with premedication on 1/25.  700 cc of fluid removed.  Currently no further indication for repeat thoracentesis.  If patient has deterioration in oxygenation will repeat chest x-ray  Atrial fibrillation with RVR Overall, heart  rate is better.  She is in mild sinus tachycardia.  Continue amiodarone, Cardizem and metoprolol per cardiologist.  She is on Eliquis for stroke prophylaxis.  Cardiology [facility] clinic follow-up   Hypotension Continue midodrine     Acute toxic metabolic encephalopathy, delirium Waxing and waning.  Had RRT called for confusion and chest pain  on 1/29.  Now suspect element of hospital-acquired delirium as patient has been hospitalized for nearly a month.     Recent hypoglycemia, hypokalemia  Resolved     Pneumonia  Completed antibiotics on 07/25/2022.   Other comorbidities include type II DM, restless leg syndrome, hypothyroidism, chronic venous stasis, anxiety, depression, chronic pain   DVT prophylaxis: Eliquis Code Status: Full Family Communication: Close friend at bedside 1/24 Disposition Plan: Status is: Inpatient Remains inpatient appropriate because: Multiple acute medical issues.  Disposition plan unknown at this time.   Level of care: Telemetry Cardiac  Consultants:  Cardiology Nephrology Palliative care  Procedures:  None  Antimicrobials: None   Subjective: Seen and examined.  Appears fatigued this morning.  Objective: Vitals:   08/11/22 0200 08/11/22 0500 08/11/22 0511 08/11/22 0842  BP: 96/86  114/72 117/80  Pulse: 91  93 93  Resp: 20  (!) 24 16  Temp:   97.8 F (36.6 C) 98.5 F (36.9 C)  TempSrc:   Axillary   SpO2: 93%  92% 92%  Weight:  132.9 kg    Height:        Intake/Output Summary (Last 24 hours) at 08/11/2022 1053 Last data filed at 08/11/2022 0934 Gross per 24 hour  Intake 720 ml  Output 1150 ml  Net -430 ml     Filed Weights   08/01/22 0500 08/06/22 1733 08/11/22 0500  Weight: 133.5 kg 134 kg 132.9 kg    Examination:  General exam: Appears fatigued.  Chronically ill Respiratory system: Quiet lungs.  No wheeze.  Normal work of breathing.  4 L Cardiovascular system: S1-S2, tachycardic, no appreciable murmurs, Gastrointestinal system: Obese, soft, NT/ND, normal bowel sounds Central nervous system: Alert and oriented. No focal neurological deficits. Extremities: Symmetrically decreased power bilaterally Skin: No rashes, lesions or ulcers Psychiatry: Judgement and insight appear impaired. Mood & affect flattened.     Data Reviewed: I have personally reviewed  following labs and imaging studies  CBC: Recent Labs  Lab 08/06/22 0846 08/07/22 0758 08/08/22 0900 08/10/22 1021  WBC 10.3 9.4 10.1 9.3  NEUTROABS 8.5* 7.5 8.0* 7.6  HGB 11.9* 11.7* 11.0* 12.0  HCT 42.3 40.8 38.6 42.8  MCV 89.1 88.3 88.3 90.3  PLT 493* 468* 465* 262*   Basic Metabolic Panel: Recent Labs  Lab 08/06/22 0846 08/07/22 0753 08/08/22 0900 08/09/22 0752 08/10/22 1021 08/11/22 0822  NA 141 139 139 137 140 140  K 4.2 4.0 4.0 3.9 4.1 3.8  CL 98 94* 94* 96* 95* 97*  CO2 31 31 34* 33* 34* 35*  GLUCOSE 78 83 88 73 96 85  BUN 74* 74* 72* 69* 65* 67*  CREATININE 2.26* 2.26* 2.17* 2.18* 1.87* 1.80*  CALCIUM 8.4* 8.3* 8.2* 8.4* 8.8* 8.3*  MG 2.7*  --   --  2.5* 2.5*  --    GFR: Estimated Creatinine Clearance: 41.3 mL/min (A) (by C-G formula based on SCr of 1.8 mg/dL (H)). Liver Function Tests: No results for input(s): "AST", "ALT", "ALKPHOS", "BILITOT", "PROT", "ALBUMIN" in the last 168 hours. No results for input(s): "LIPASE", "AMYLASE" in the last 168 hours. No results for input(s): "AMMONIA" in the last 168  hours. Coagulation Profile: No results for input(s): "INR", "PROTIME" in the last 168 hours. Cardiac Enzymes: No results for input(s): "CKTOTAL", "CKMB", "CKMBINDEX", "TROPONINI" in the last 168 hours. BNP (last 3 results) No results for input(s): "PROBNP" in the last 8760 hours. HbA1C: No results for input(s): "HGBA1C" in the last 72 hours. CBG: Recent Labs  Lab 08/10/22 0836 08/10/22 1244 08/10/22 1700 08/10/22 2328 08/11/22 0844  GLUCAP 76 105* 113* 105* 86   Lipid Profile: No results for input(s): "CHOL", "HDL", "LDLCALC", "TRIG", "CHOLHDL", "LDLDIRECT" in the last 72 hours. Thyroid Function Tests: No results for input(s): "TSH", "T4TOTAL", "FREET4", "T3FREE", "THYROIDAB" in the last 72 hours. Anemia Panel: No results for input(s): "VITAMINB12", "FOLATE", "FERRITIN", "TIBC", "IRON", "RETICCTPCT" in the last 72 hours. Sepsis Labs: No results  for input(s): "PROCALCITON", "LATICACIDVEN" in the last 168 hours.  No results found for this or any previous visit (from the past 240 hour(s)).       Radiology Studies: DG Chest Port 1 View  Result Date: 08/11/2022 CLINICAL DATA:  Dyspnea EXAM: PORTABLE CHEST 1 VIEW COMPARISON:  08/08/2022 FINDINGS: Cardiomegaly with mild central interstitial edema. Moderate right and small left pleural effusions. No pneumothorax. IMPRESSION: Cardiomegaly with mild central interstitial edema. Moderate right and small left pleural effusions. Electronically Signed   By: Julian Hy M.D.   On: 08/11/2022 00:11        Scheduled Meds:  amiodarone  200 mg Oral Daily   apixaban  5 mg Oral BID   atorvastatin  20 mg Oral Daily   budesonide (PULMICORT) nebulizer solution  0.25 mg Nebulization BID   buPROPion  150 mg Oral Daily   Chlorhexidine Gluconate Cloth  6 each Topical Daily   diltiazem  180 mg Oral Daily   feeding supplement  237 mL Oral BID BM   furosemide  20 mg Intravenous Once   hydrocerin   Topical BID   ipratropium-albuterol  3 mL Nebulization BID   isosorbide mononitrate  15 mg Oral BID   levothyroxine  250 mcg Oral Q0600   metoprolol tartrate  50 mg Oral BID   midodrine  10 mg Oral TID WC   nitroGLYCERIN       mouth rinse  15 mL Mouth Rinse 4 times per day   pantoprazole  40 mg Oral Daily   rOPINIRole  1 mg Oral QHS   Continuous Infusions:   LOS: 25 days    Sidney Ace, MD [facility]   If 7PM-7AM, please contact night-coverage  08/11/2022, 10:53 AM

## 2022-08-11 NOTE — Progress Notes (Signed)
Rapid called due to patient complaining of 10/10 chest pain. Patient intermittently confused and patient called husband to come in and be with her at the bedside. Hospitalist Foust NP put in an order for nitro but patient spit out the medication. After a few minutes patient claimed her pain in her chest was a 8 or 9/10. Once husband stated he was not cheating nor naked as the patient stated, her chest pain began to subside even more.

## 2022-08-11 NOTE — Progress Notes (Addendum)
CROSS COVER NOTE  NAME: Krishna Heuer MRN: [id number] DOB : Mar 29, 1956 ATTENDING PHYSICIAN: Sidney Ace, MD    Date of Service   08/11/2022   HPI/Events of Note   Rapid response called for chest pain and confusion.  M(r)s Warn is reporting 8/10 pain. When asked to point to where it hurts she points to her xiphoid process and makes a sweeping motion towards LUQ of her abdomen. Pain is not made worse on palpation. She is unable to describe the pain or provide aggrevating/alleviating factors. Nitroglycerin given by staff prior to my arrival to bedside. Patient did not allow it to dissolve and she spit pill out while I was accessing her. Lungs are clear to auscultation and diminished in the bases.   Husband reports M(r)s [place]a has been intermittently confused throughout the week but tonight is worse.She called him at home tonight and told him to come back to the hospital immediately and he reports she was upset with him and concerned he was cheating on her. He says this is not normal for her and a change from recent mental status. At beside she is alert and confused, she is delayed in her responses but able to tell me her name and DOB.  Husband also reports he thinks the chest pain could be abdominal pain as she has been constipated and reporting abdominal discomfort over the past few days.   Also of note, husband does not want patient to have Morphine or Benzos. He reports she was previously Narcaned after receiving Morphine and that she is very sensitive to benzos and has become lethargic after very low doses.  Vitals: BP 123/92 HR 88 RR 19 SPO2 95%   Interventions   Assessment/Plan:  Acute Metabolic Encephalopathy secondary to Acute on Chronic Respiratory Failure with Hypoxia and Hypercapnia VBG - pH 7.32 pCO2 82 pO2 52 Bicarbonate 42.2 O2 Saturation 80.8 Bipap  Chest Pain EKG Trop- 15-->16 CXR --> Mild interstitial edema, moderate right and small left  pleural effusions Redose 20 mg IV lasix, follow creatinine in AM  Constipation Senokot        To reach the provider On-Call:   7AM- 7PM see care teams to locate the attending and reach out to them via [email or web address] Password: [email or web address] 7PM-7AM contact night-coverage If you still have difficulty reaching the appropriate provider, please page the [facility] (Director on Call) for [facility] on amion for assistance  This document was prepared using Systems analyst and may include unintentional dictation errors.  Neomia Glass DNP, MBA, FNP-BC, PMHNP-BC Nurse Practitioner [facility] Pager [phone]
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
## 2022-08-12 ENCOUNTER — Inpatient Hospital Stay: Payer: Medicare Other

## 2022-08-12 DIAGNOSIS — I480 Paroxysmal atrial fibrillation: Secondary | ICD-10-CM | POA: Diagnosis not present

## 2022-08-12 LAB — GLUCOSE, CAPILLARY
Glucose-Capillary: 77 mg/dL (ref 70–99)
Glucose-Capillary: 101 mg/dL — ABNORMAL HIGH (ref 70–99)
Glucose-Capillary: 66 mg/dL — ABNORMAL LOW (ref 70–99)
Glucose-Capillary: 109 mg/dL — ABNORMAL HIGH (ref 70–99)

## 2022-08-12 LAB — BLOOD GAS, ARTERIAL
Acid-Base Excess: 5 mmol/L — ABNORMAL HIGH (ref 0.0–2.0)
Bicarbonate: 32 mmol/L — ABNORMAL HIGH (ref 20.0–28.0)
FIO2: 100 %
MECHVT: 450 mL
Mechanical Rate: 22
O2 Saturation: 91.7 %
PEEP: 10 cmH2O
Patient temperature: 37
pCO2 arterial: 58 mmHg — ABNORMAL HIGH (ref 32–48)
pH, Arterial: 7.35 (ref 7.35–7.45)
pO2, Arterial: 63 mmHg — ABNORMAL LOW (ref 83–108)

## 2022-08-12 MED ORDER — GLYCOPYRROLATE 0.2 MG/ML IJ SOLN: 0.2000 mg | INTRAMUSCULAR | Status: DC | PRN

## 2022-08-12 MED ORDER — POLYVINYL ALCOHOL 1.4 % OP SOLN: 1.0000 [drp] | Freq: Four times a day (QID) | OPHTHALMIC | Status: DC | PRN

## 2022-08-12 MED ORDER — ATROPINE SULFATE 1 MG/10ML IJ SOSY
PREFILLED_SYRINGE | INTRAMUSCULAR | Status: AC
  Filled 2022-08-12: qty 10

## 2022-08-12 MED ORDER — LORAZEPAM 2 MG/ML IJ SOLN: 2.0000 mg | INTRAMUSCULAR | Status: DC | PRN

## 2022-08-12 MED ORDER — NOREPINEPHRINE 4 MG/250ML-% IV SOLN
2.0000 ug/min | INTRAVENOUS | Status: DC
  Administered 2022-08-12: 2 ug/min via INTRAVENOUS
  Filled 2022-08-12: qty 250

## 2022-08-12 MED ORDER — ONDANSETRON HCL 4 MG/2ML IJ SOLN: 4.0000 mg | Freq: Four times a day (QID) | INTRAMUSCULAR | Status: DC | PRN

## 2022-08-12 MED ORDER — ACETAMINOPHEN 325 MG PO TABS: 650.0000 mg | ORAL_TABLET | Freq: Four times a day (QID) | ORAL | Status: DC | PRN

## 2022-08-12 MED ORDER — ACETAMINOPHEN 650 MG RE SUPP: 650.0000 mg | Freq: Four times a day (QID) | RECTAL | Status: DC | PRN

## 2022-08-12 MED ORDER — FENTANYL BOLUS VIA INFUSION: 100.0000 ug | INTRAVENOUS | Status: DC | PRN

## 2022-08-12 MED ORDER — FENTANYL CITRATE PF 50 MCG/ML IJ SOSY
50.0000 ug | PREFILLED_SYRINGE | Freq: Once | INTRAMUSCULAR | Status: AC
  Administered 2022-08-12: 50 ug via INTRAVENOUS

## 2022-08-12 MED ORDER — SODIUM CHLORIDE 0.9 % IV SOLN: INTRAVENOUS | Status: DC

## 2022-08-12 MED ORDER — ONDANSETRON 4 MG PO TBDP: 4.0000 mg | ORAL_TABLET | Freq: Four times a day (QID) | ORAL | Status: DC | PRN

## 2022-08-12 MED ORDER — FENTANYL 2500MCG IN NS 250ML (10MCG/ML) PREMIX INFUSION
25.0000 ug/h | INTRAVENOUS | Status: DC
  Administered 2022-08-12: 50 ug/h via INTRAVENOUS
  Filled 2022-08-12: qty 250

## 2022-08-12 MED ORDER — MIDAZOLAM HCL 2 MG/2ML IJ SOLN: 2.0000 mg | Freq: Once | INTRAMUSCULAR | Status: DC

## 2022-08-12 MED ORDER — HALOPERIDOL LACTATE 5 MG/ML IJ SOLN: 2.5000 mg | INTRAMUSCULAR | Status: DC | PRN

## 2022-08-12 MED ORDER — ZINC OXIDE 40 % EX OINT
TOPICAL_OINTMENT | Freq: Three times a day (TID) | CUTANEOUS | Status: DC
  Filled 2022-08-12: qty 113

## 2022-08-12 MED ORDER — FENTANYL CITRATE PF 50 MCG/ML IJ SOSY: 25.0000 ug | PREFILLED_SYRINGE | Freq: Once | INTRAMUSCULAR | Status: DC

## 2022-08-12 MED ORDER — GLYCOPYRROLATE 1 MG PO TABS: 1.0000 mg | ORAL_TABLET | ORAL | Status: DC | PRN

## 2022-08-12 MED ORDER — FENTANYL CITRATE (PF) 100 MCG/2ML IJ SOLN
INTRAMUSCULAR | Status: AC
  Filled 2022-08-12: qty 2

## 2022-08-12 MED ORDER — FENTANYL BOLUS VIA INFUSION: 25.0000 ug | INTRAVENOUS | Status: DC | PRN

## 2022-08-12 MED ORDER — DOCUSATE SODIUM 50 MG/5ML PO LIQD: 100.0000 mg | Freq: Two times a day (BID) | ORAL | Status: DC

## 2022-08-12 MED ORDER — POLYETHYLENE GLYCOL 3350 17 G PO PACK: 17.0000 g | PACK | Freq: Every day | ORAL | Status: DC

## 2022-08-12 MED ORDER — PANTOPRAZOLE SODIUM 40 MG IV SOLR: 40.0000 mg | Freq: Every day | INTRAVENOUS | Status: DC

## 2022-08-12 MED ORDER — MIDAZOLAM HCL 2 MG/2ML IJ SOLN
1.0000 mg | INTRAMUSCULAR | Status: DC | PRN
  Administered 2022-08-12: 2 mg via INTRAVENOUS
  Filled 2022-08-12 (×2): qty 2

## 2022-08-12 MED ORDER — EPINEPHRINE PF 1 MG/ML IJ SOLN
INTRAMUSCULAR | Status: AC
  Filled 2022-08-12: qty 1

## 2022-08-12 MED ORDER — FUROSEMIDE 10 MG/ML IJ SOLN
20.0000 mg | Freq: Two times a day (BID) | INTRAMUSCULAR | Status: DC
  Administered 2022-08-12: 20 mg via INTRAVENOUS
  Filled 2022-08-12: qty 2

## 2022-08-12 MED ORDER — NOREPINEPHRINE 16 MG/250ML-% IV SOLN
0.0000 ug/min | INTRAVENOUS | Status: DC
  Administered 2022-08-12: 10 ug/min via INTRAVENOUS
  Filled 2022-08-12: qty 250

## 2022-08-12 MED ORDER — SODIUM CHLORIDE 0.9 % IV SOLN: 250.0000 mL | INTRAVENOUS | Status: DC

## 2022-08-12 MED ORDER — ATROPINE SULFATE 1 MG/10ML IJ SOSY
1.0000 mg | PREFILLED_SYRINGE | Freq: Once | INTRAMUSCULAR | Status: AC
  Administered 2022-08-12: 1 mg via INTRAVENOUS

## 2022-08-12 MED ORDER — POTASSIUM CHLORIDE CRYS ER 20 MEQ PO TBCR
20.0000 meq | EXTENDED_RELEASE_TABLET | Freq: Every day | ORAL | Status: DC
  Administered 2022-08-12: 20 meq via ORAL
  Filled 2022-08-12: qty 1

## 2022-08-13 NOTE — Progress Notes (Signed)
Extubated per comfort care order.

## 2022-08-13 NOTE — Procedures (Signed)
Intubation Procedure Note  Madison Mcbride  [id number]  [birth year]  Date:05-Sep-2022  Time:12:45 PM   Provider Performing:Berthel Bagnall [place] Poli  Supervising Provider: Duffy Bruce MD   Procedure: Intubation (35361)  Indication(s) Respiratory Failure  Consent Unable to obtain consent due to emergent nature of procedure.   Anesthesia None   Time Out Verified patient identification, verified procedure.  Sterile Technique Usual hand hygeine, masks, and gloves were used   Procedure Description Patient positioned in bed supine.  Patient was intubated with endotracheal tube using Glidescope.  View was Grade 1 full glottis .  Number of attempts was 1.  Colorimetric CO2 detector was consistent with tracheal placement.   Complications/Tolerance None; patient tolerated the procedure well. Chest X-ray is ordered to verify placement.   EBL None   Specimen(s) None

## 2022-08-13 NOTE — ED Provider Notes (Signed)
[facility] Department of Emergency Medicine   Code Blue CONSULT NOTE  Chief Complaint: Cardiac arrest/unresponsive   Level V Caveat: Unresponsive  History of present illness: I was contacted by the hospital for a CODE BLUE cardiac arrest upstairs and presented to the patient's bedside.   Patient admitted for acute on chronic resp failure per report, had just been weaned off 44. PEA arrest, likely hypercapnea+hypoxia.   ROS: Unable to obtain, Level V caveat  Scheduled Meds:  amiodarone  200 mg Oral Daily   apixaban  5 mg Oral BID   atorvastatin  20 mg Oral Daily   budesonide (PULMICORT) nebulizer solution  0.25 mg Nebulization BID   buPROPion  150 mg Oral Daily   Chlorhexidine Gluconate Cloth  6 each Topical Daily   diltiazem  180 mg Oral Daily   docusate  100 mg Per Tube BID   EPINEPHrine       feeding supplement  237 mL Oral BID BM   fentaNYL       fentaNYL (SUBLIMAZE) injection  25 mcg Intravenous Once   hydrocerin   Topical BID   ipratropium-albuterol  3 mL Nebulization BID   levothyroxine  250 mcg Oral Q0600   liver oil-zinc oxide   Topical TID   midazolam  2 mg Intravenous Once   midodrine  10 mg Oral TID WC   mouth rinse  15 mL Mouth Rinse 4 times per day   pantoprazole (PROTONIX) IV  40 mg Intravenous Daily   polyethylene glycol  17 g Per Tube Daily   potassium chloride  20 mEq Oral Daily   rOPINIRole  1 mg Oral QHS   Continuous Infusions:  sodium chloride Stopped (02-Sep-2022 1542)   sodium chloride     fentaNYL infusion INTRAVENOUS 50 mcg/hr (02-Sep-2022 1405)   norepinephrine (LEVOPHED) Adult infusion 10 mcg/min (09-02-22 1451)   PRN Meds:.acetaminophen **OR** acetaminophen, EPINEPHrine, fentaNYL, fentaNYL, glycopyrrolate **OR** glycopyrrolate **OR** glycopyrrolate, guaiFENesin, haloperidol lactate, levalbuterol, liver oil-zinc oxide, LORazepam, ondansetron **OR** ondansetron (ZOFRAN) IV, mouth rinse, polyethylene glycol, polyvinyl alcohol,  senna-docusate Past Medical History:  Diagnosis Date   CHF (congestive heart failure) ([place])    Diabetes mellitus without complication ([place])    Hypothyroidism    Lymphedema    RLS (restless legs syndrome)    Sleep apnea    Thyroid cancer ([place]) 2007   Past Surgical History:  Procedure Laterality Date   ABDOMINAL HYSTERECTOMY     COLONOSCOPY WITH PROPOFOL N/A 08/26/2015   Procedure: COLONOSCOPY WITH PROPOFOL;  Surgeon: Manya Silvas, MD;  Location: [facility] ENDOSCOPY;  Service: Endoscopy;  Laterality: N/A;   THYROIDECTOMY     Social History   Socioeconomic History   Marital status: Married    Spouse name: Not on file   Number of children: Not on file   Years of education: Not on file   Highest education level: Not on file  Occupational History   Not on file  Tobacco Use   Smoking status: Never   Smokeless tobacco: Never  Substance and Sexual Activity   Alcohol use: No   Drug use: No   Sexual activity: Not on file  Other Topics Concern   Not on file  Social History Narrative   Not on file   Social Determinants of Health   Financial Resource Strain: Not on file  Food Insecurity: No Food Insecurity (07/18/2022)   Hunger Vital Sign    Worried About Running Out of Food in the Last Year: Never true  Ran Out of Food in the Last Year: Never true  Transportation Needs: No Transportation Needs (07/18/2022)   PRAPARE - Hydrologist (Medical): No    Lack of Transportation (Non-Medical): No  Physical Activity: Not on file  Stress: Not on file  Social Connections: Not on file  Intimate Partner Violence: Not At Risk (07/18/2022)   Humiliation, Afraid, Rape, and Kick questionnaire    Fear of Current or Ex-Partner: No    Emotionally Abused: No    Physically Abused: No    Sexually Abused: No   No Known Allergies  Last set of Vital Signs (not current) Vitals:   2022/09/08 1515 09/08/22 1530  BP: (!) 95/55 (!) 97/57  Pulse: (!) 46 (!) 47  Resp: (!) 22  (!) 22  Temp:    SpO2: 96% 97%      Physical Exam Gen: unresponsive Cardiovascular: pulseless  Resp: apneic. Breath sounds equal bilaterally with bagging  Abd: nondistended  Neuro: GCS 3, unresponsive to pain  HEENT: No blood in posterior pharynx, gag reflex absent  Neck: No crepitus  Musculoskeletal: No deformity  Skin: warm  Procedures (when applicable, including Critical Care time): Procedures   MDM / Assessment and Plan ICU team at bedside and assumed care ROSC achieved after 2 rounds of Epi RT BVM and intubation supervised by me, tolerated well w/ Glidescope 4 Care per ICU    Duffy Bruce, MD 09/08/22 1544

## 2022-08-13 NOTE — IPAL (Signed)
  Interdisciplinary Goals of Care Family Meeting   Date carried out: 09-11-2022  Location of the meeting: Bedside  Member's involved: Physician, Nurse Practitioner, Bedside Registered Nurse, and Family Member or next of kin     GOALS OF CARE DISCUSSION  The Clinical status was relayed to family in detail- Husband and 2 sons at bedside  Updated and notified of patients medical condition- Patient remains unresponsive and will not open eyes to command.   Patient with increased WOB and using accessory muscles to breathe Explained to family course of therapy and the modalities   Patient with Progressive multiorgan failure with a very high probablity of a very minimal chance of meaningful recovery despite all aggressive and optimal medical therapy.    Family understands the situation. Severe Cardiac failure and RESP failure Multiorgan failure Concerns of stroke and anoxic brain damage Patient has poor quality of life last 2 years Patient has not been home since last 4 months  They do NOT want artifical support and she would NOT want machines and they do not want TRACh/PEG tubes.  They do NOT want patient to suffer anymore   They have consented and agreed to DNR/DNI and would like to proceed with Comfort care measures.  Family are satisfied with Plan of action and management. All questions answered  Additional CC time 35 mins   Lynton Crescenzo Patricia Pesa, M.D.  [facility]  Medical Director [place] Director [facility] Cardio-Pulmonary Department

## 2022-08-13 NOTE — Progress Notes (Signed)
PROGRESS NOTE  Madison Mcbride  HYW:[id number] DOB: 26-Apr-1956 DOA: 08/09/2022 PCP: Rusty Aus, MD   Brief Narrative:  67 y.o. female with medical history significant for morbid obesity, chronic diastolic CHF, hypothyroidism, type II DM, obstructive sleep apnea, lymphedema, CKD stage IIIa, OSA, chronic hypoxic and hypercapnic respiratory failure on 2 L/min oxygen and trilogy machine at night.  Presented to [facility] ED 07/10/2022 from a skilled nursing facility out of concern for tachycardia, heart rates on admission were in the 130s, Presented with leukocytosis to 12.6K, UA concerning for cystitis. She initially met sepsis criteria, started on IV meropenem for treatment of cystitis due to history of ESBL and IV diltiazem for AF RVR.  Discharge back to SNF was planned to the afternoon of 07/15/2021, but the patient was tachycardic, and the SNF refused to accept her.   Respiratory status has been very tenuous during this admission.  She is constantly short of breath.  Conversational dyspnea even with minimal exertion.  Even eating causes her dyspnea worsen.  We attempted thoracentesis on 1/24.  Unfortunately patient was unable to tolerate.  After premedication patient was able to tolerate procedure on 1/25.  700 cc fluid removed.  Rapid response called overnight 1/30 for chest pain and confusion.  Seem to be more hospital-acquired delirium.  Patient was confused and apparently thought her husband might be cheating on her.  Workup negative.  Chest pain improved.  Remains on 4 L nasal cannula.  Assessment & Plan:   Principal Problem:   Paroxysmal atrial fibrillation with RVR (HCC) Active Problems:   Acute on chronic respiratory failure with hypoxia and hypercapnia (HCC)   Acute metabolic encephalopathy   Acute on chronic systolic CHF (congestive heart failure) (HCC)   HLD (hyperlipidemia)   OSA (obstructive sleep apnea)   Type 2 diabetes mellitus with diabetic neuropathy, without long-term  current use of insulin (HCC)   Elevated troponin   Hypothyroidism   Acute respiratory failure with hypoxia (HCC)   AKI (acute kidney injury) ([place])   Thoracic aortic aneurysm (HCC)   Hyperkalemia   Hypotension   Leukocytosis   Normocytic anemia   Acute pulmonary edema (HCC)   HAP (hospital-acquired pneumonia)   Goals of care, counseling/discussion   Delirium   Bilateral pleural effusion  Acute on chronic systolic CHF, pulmonary hypertension  Status post low-dose Lasix challenge, renal function improving slowly.  At this point will continue dosing 20 mg IV Lasix BID with K supplementation. Hoping this may help wean oxygen.   AKI on CKD stage IIIa, hyperkalemia Creatinine improved after Lasix challenge.  Will continue lasix with a close eye on renal function.  Acute on chronic hypoxic and hypercapnic respiratory failure, COPD, OHS, OSA Oxygen requirement has gone up from baseline.  Was requiring maximum of 6 L via nasal cannula.  Postthoracentesis down to 4 L.  BiPAP at night and when napping.  Baseline requirement of prior to hospitalization was 2 L.  Continue to wean as able.    Bilateral pleural effusion, R>L Patient was able to tolerate procedure with premedication on 1/25.  700 cc of fluid removed.  Currently no further indication for repeat thoracentesis.  If patient has deterioration in oxygenation will repeat chest x-ray.  Atrial fibrillation with RVR Overall, heart rate is better.  She is in mild sinus tachycardia.  Continue amiodarone, Cardizem and metoprolol per cardiologist.  She is on Eliquis for stroke prophylaxis.  Cardiology [facility] clinic follow-up.   Hypotension Continue midodrine    Acute toxic  metabolic encephalopathy, delirium Waxing and waning.  Had RRT called for confusion and chest pain on 1/30.  Now suspect element of hospital-acquired delirium as patient has been hospitalized for nearly a month.     Recent hypoglycemia, hypokalemia Resolved     Pneumonia Completed antibiotics on 07/25/2022.   Other comorbidities include type II DM, restless leg syndrome, hypothyroidism, chronic venous stasis, anxiety, depression, chronic pain  DVT prophylaxis: Eliquis Code Status: Full Family Communication: Close friend at bedside 1/24 Disposition Plan: Status is: Inpatient Remains inpatient appropriate because: Multiple acute medical issues including hypercapnic and hypoxic failure.  Disposition plan unknown at this time, she needs SNF but will be a very high readmission risk.  Level of care: Telemetry Cardiac  Consultants:  Cardiology Nephrology Palliative care (signed off)  Procedures:  None  Antimicrobials: None  Subjective: Seen and examined.  Appears fatigued this morning but stable, on Bipap, wants to take it off so she can drink.  Objective: Vitals:   05-Sep-2022 0237 09-05-22 0400 2022/09/05 0630 09/05/22 0757  BP:    106/72  Pulse:    88  Resp:    (!) 24  Temp:  (!) 96.2 F (35.7 C) (!) 97.5 F (36.4 C) 97.8 F (36.6 C)  TempSrc:  Axillary Rectal Axillary  SpO2: 94%   100%  Weight:      Height:        Intake/Output Summary (Last 24 hours) at September 05, 2022 1048 Last data filed at 2022-09-05 1003 Gross per 24 hour  Intake 240 ml  Output 650 ml  Net -410 ml    Filed Weights   08/06/22 1733 08/11/22 0500 09/05/2022 0100  Weight: 134 kg 132.9 kg 133.6 kg   Examination: General:  Alert, oriented, calm, in no acute distress, on Bipap, friend present Eyes: EOMI, clear conjuctivae, white sclerea Neck: supple, no masses, trachea mildline  Cardiovascular: RRR, no murmurs or rubs, no peripheral edema  Respiratory: breath sounds distant especially at bases, no resp distress, wheezing or rhonchi Abdomen: soft, nontender, nondistended, normal bowel tones heard  Skin: dry, no rashes  Musculoskeletal: no joint effusions, normal range of motion  Psychiatric: appropriate affect, normal speech  Neurologic: extraocular muscles  intact, clear speech, moving all extremities with intact sensorium  Data Reviewed: I have personally reviewed following labs and imaging studies  CBC: Recent Labs  Lab 08/06/22 0846 08/07/22 0758 08/08/22 0900 08/10/22 1021  WBC 10.3 9.4 10.1 9.3  NEUTROABS 8.5* 7.5 8.0* 7.6  HGB 11.9* 11.7* 11.0* 12.0  HCT 42.3 40.8 38.6 42.8  MCV 89.1 88.3 88.3 90.3  PLT 493* 468* 465* 424*    Basic Metabolic Panel: Recent Labs  Lab 08/06/22 0846 08/07/22 0753 08/08/22 0900 08/09/22 0752 08/10/22 1021 08/11/22 0822  NA 141 139 139 137 140 140  K 4.2 4.0 4.0 3.9 4.1 3.8  CL 98 94* 94* 96* 95* 97*  CO2 31 31 34* 33* 34* 35*  GLUCOSE 78 83 88 73 96 85  BUN 74* 74* 72* 69* 65* 67*  CREATININE 2.26* 2.26* 2.17* 2.18* 1.87* 1.80*  CALCIUM 8.4* 8.3* 8.2* 8.4* 8.8* 8.3*  MG 2.7*  --   --  2.5* 2.5*  --     GFR: Estimated Creatinine Clearance: 41.4 mL/min (A) (by C-G formula based on SCr of 1.8 mg/dL (H)). Liver Function Tests: No results for input(s): "AST", "ALT", "ALKPHOS", "BILITOT", "PROT", "ALBUMIN" in the last 168 hours. No results for input(s): "LIPASE", "AMYLASE" in the last 168 hours. No results for input(s): "  AMMONIA" in the last 168 hours. Coagulation Profile: No results for input(s): "INR", "PROTIME" in the last 168 hours. Cardiac Enzymes: No results for input(s): "CKTOTAL", "CKMB", "CKMBINDEX", "TROPONINI" in the last 168 hours. BNP (last 3 results) No results for input(s): "PROBNP" in the last 8760 hours. HbA1C: No results for input(s): "HGBA1C" in the last 72 hours. CBG: Recent Labs  Lab 08/11/22 2219 08/11/22 2340 08/15/22 0143 08-15-22 0426 Aug 15, 2022 0753  GLUCAP 93 98 77 101* 66*   Lipid Profile: No results for input(s): "CHOL", "HDL", "LDLCALC", "TRIG", "CHOLHDL", "LDLDIRECT" in the last 72 hours. Thyroid Function Tests: No results for input(s): "TSH", "T4TOTAL", "FREET4", "T3FREE", "THYROIDAB" in the last 72 hours. Anemia Panel: No results for input(s):  "VITAMINB12", "FOLATE", "FERRITIN", "TIBC", "IRON", "RETICCTPCT" in the last 72 hours. Sepsis Labs: No results for input(s): "PROCALCITON", "LATICACIDVEN" in the last 168 hours.  No results found for this or any previous visit (from the past 240 hour(s)).   Radiology Studies: DG Chest Port 1 View  Result Date: 08/11/2022 CLINICAL DATA:  Dyspnea EXAM: PORTABLE CHEST 1 VIEW COMPARISON:  08/08/2022 FINDINGS: Cardiomegaly with mild central interstitial edema. Moderate right and small left pleural effusions. No pneumothorax. IMPRESSION: Cardiomegaly with mild central interstitial edema. Moderate right and small left pleural effusions. Electronically Signed   By: Julian Hy M.D.   On: 08/11/2022 00:11    Scheduled Meds:  amiodarone  200 mg Oral Daily   apixaban  5 mg Oral BID   atorvastatin  20 mg Oral Daily   budesonide (PULMICORT) nebulizer solution  0.25 mg Nebulization BID   buPROPion  150 mg Oral Daily   Chlorhexidine Gluconate Cloth  6 each Topical Daily   diltiazem  180 mg Oral Daily   feeding supplement  237 mL Oral BID BM   furosemide  20 mg Intravenous BID   hydrocerin   Topical BID   ipratropium-albuterol  3 mL Nebulization BID   isosorbide mononitrate  15 mg Oral BID   levothyroxine  250 mcg Oral Q0600   metoprolol tartrate  50 mg Oral BID   midodrine  10 mg Oral TID WC   mouth rinse  15 mL Mouth Rinse 4 times per day   pantoprazole  40 mg Oral Daily   potassium chloride  20 mEq Oral Daily   rOPINIRole  1 mg Oral QHS   Continuous Infusions:   LOS: 26 days    Kimora Stankovic Neva Seat, MD [facility]   If 7PM-7AM, please contact night-coverage  Aug 15, 2022, 10:48 AM

## 2022-08-13 NOTE — Significant Event (Signed)
CODE BLUE called this afternoon while patient was being bathed. She had been weaned off Bipap to 4L [place] and was doing well.  I responded immediately while CPR was in progress. Monitor with PEA. Given 2 rounds of Epi and intubated by RT.  Blood sugar 109, no narcotics given per RN. ROSC achieved and patient moved to ICU. Discussed with friend, who has notified husband. Discussed with ICU team who are taking over care.

## 2022-08-13 NOTE — Progress Notes (Signed)
   28-Aug-2022 1300  Spiritual Encounters  Type of Visit Initial  Care provided to: [facility] partners present during encounter Nurse;Physician  Referral source Code page  Reason for visit Code  OnCall Visit Yes   Chaplain responded to code blue in 249. Chaplain presents with compassion and reflective listening and spends time with family while patient is stabilized. Chaplain walks with family to ICU waiting room and continues to visit. ICU staff aware family in waiting room. Chaplin available for further assistance as needed.

## 2022-08-13 NOTE — Death Summary Note (Signed)
DEATH SUMMARY   Patient Details  Name: Madison Mcbride MRN: [id number] DOB: 1955/11/28  Admission/Discharge Information   Admit Date:  2022-08-07  Date of Death: Date of Death: 02-Sep-2022  Time of Death: Time of Death: 10/14/1551  Length of Stay: October 27, 2022  Referring Physician: Rusty Aus, MD   Reason(s) for Hospitalization  Acute on Chronic HFrEF In-Hospital PEA Cardiac Arrest Multifactorial Shock: Hypovolemic in setting of diuresis vs Cardiogenic Atrial Fibrillation with RVR Acute on Chronic Hypoxic & Hypercapnic Respiratory Failure Bilateral Pleural Effusions AKI on CKD Stage IIIa Pneumonia Acute Metabolic Encephalopathy Diabetes Mellitus Type II Hypothyroidism  Diagnoses  Preliminary cause of death:  Acute Decompensated HFrEF Secondary Diagnoses (including complications and co-morbidities):  Principal Problem:   Paroxysmal atrial fibrillation with RVR (HCC) Active Problems:   Type 2 diabetes mellitus with diabetic neuropathy, without long-term current use of insulin (HCC)   Hypothyroidism   Acute metabolic encephalopathy   HLD (hyperlipidemia)   Elevated troponin   OSA (obstructive sleep apnea)   Acute respiratory failure with hypoxia (HCC)   Acute on chronic systolic CHF (congestive heart failure) (HCC)   Acute on chronic respiratory failure with hypoxia and hypercapnia (HCC)   AKI (acute kidney injury) (HCC)   Thoracic aortic aneurysm (HCC)   Hyperkalemia   Hypotension   Leukocytosis   Normocytic anemia   Acute pulmonary edema (HCC)   HAP (hospital-acquired pneumonia)   Goals of care, counseling/discussion   Delirium   Bilateral pleural effusion   Brief Hospital Course (including significant findings, care, treatment, and services provided and events leading to death)  Madison Mcbride is a 67 y.o. female with medical history significant for morbid obesity, chronic diastolic CHF, hypothyroidism, type II DM, obstructive sleep apnea, lymphedema, CKD stage  IIIa, OSA, chronic hypoxic and hypercapnic respiratory failure on 2 L/min oxygen and trilogy machine at night.  Presented to [facility] ED 06/21/2022 from a skilled nursing facility out of concern for tachycardia, heart rates on admission were in the 130s, Presented with leukocytosis to 12.6K, UA concerning for cystitis. She initially met sepsis criteria, started on IV meropenem for treatment of cystitis due to history of ESBL and IV diltiazem for AF RVR.  Discharge back to SNF was planned to the afternoon of 07/15/2021, but the patient was tachycardic, and the SNF refused to accept her    Respiratory status has been very tenuous during this admission.  She is constantly short of breath.  Conversational dyspnea even with minimal exertion.  Even eating causes her dyspnea worsen.  We attempted thoracentesis on 1/24.  Unfortunately patient was unable to tolerate.  After premedication patient was able to tolerate procedure on 1/25.  700 cc fluid removed.   Rapid response called overnight 1/30 for chest pain and confusion.  Seem to be more hospital-acquired delirium.  Patient was confused and apparently thought her husband might be cheating on her.  Workup negative.  Chest pain improved.  Remains on 4 L nasal cannula.   On 2022/09/02 she had been weaned off BiPAP to 4L [place] and seemed to be tolerating.  However while being bathed she suffered a brief PEA Cardiac Arrest.  Report is that she became unresponsive without pulses of which CPR was initiated and she received rounds of Epi with ROSC obtained about 5 minutes of ACLS.  She was intubated by ED provider, and transferred to ICU.  PCCM consulted.   Please see "significant hospital events" section below for full detailed hospital course.  Significant Hospital Events: Including  procedures, antibiotic start and stop dates in addition to other pertinent events   07/05/2022: Admitted to [facility] for treatment of UTI 08/10/2022: Discharged to SNF 07/28/2022: Presented back to ED same day of  discharge due to tachycardia and SOB. Admitted for A.fib w/ RVR, Acute CHF exacerbation and suspected pneumonia. 1/24: Attempt at thoracentesis, but pt unable to tolerate 1/25: Able to obtain thoracentesis after premedication 1/30: Rapid response called late last night for chest pain and confusion. Seem to be more hospital-acquired delirium. Patient was confused and apparently thought her husband might be cheating on her. Workup negative. Chest pain improved. Remains on 4 L nasal cannula.  08/17/22: Suffered brief in-hospital PEA Cardia arrest (approximately 5 minutes, suspect respiratory etiology).  Intubated and placed on mechanical ventilation, PCCM consulted.  Code status changed to DNR, family is considering comfort measures. Aug 17, 2022: Transitioned to comfort measures per family request, expired shortly after withdrawal  Pertinent Labs and Studies  Significant Diagnostic Studies DG Abd 1 View  Result Date: 08/17/22 CLINICAL DATA:  Nasogastric tube placement. EXAM: ABDOMEN - 1 VIEW COMPARISON:  None Available. FINDINGS: Nasogastric tube terminates in the stomach. Defibrillator pad overlies the lower left chest. Fair amount of stool is seen in the visualized colon. IMPRESSION: 1. Feeding tube terminates in the stomach. 2. Stool throughout the visualized colon is indicative of constipation. Electronically Signed   By: Lorin Picket M.D.   On: 2022/08/17 14:50   DG Chest Port 1 View  Result Date: Aug 17, 2022 CLINICAL DATA:  Intubation. EXAM: PORTABLE CHEST 1 VIEW COMPARISON:  08/10/2022 and CT chest 07/27/2022. FINDINGS: Patient is rotated. Endotracheal tube terminates approximately 5 cm above the carina. Right IJ central line tip is in the low right internal jugular vein or at the brachiocephalic vein junction. Nasogastric tube is followed into the distal esophagus with the tip projecting beyond the inferior margin of the image. A defibrillator pad overlies the upper right chest. Heart is enlarged,  stable. Diffuse mixed interstitial and airspace opacification with left lower lobe consolidation. There may be layering bilateral pleural effusions. Right costophrenic angle was not included in the field of view. IMPRESSION: Probable congestive heart failure. Left lower lobe consolidation, difficult to exclude pneumonia. Electronically Signed   By: Lorin Picket M.D.   On: 08-17-2022 14:46   DG Chest Port 1 View  Result Date: 08/11/2022 CLINICAL DATA:  Dyspnea EXAM: PORTABLE CHEST 1 VIEW COMPARISON:  08/08/2022 FINDINGS: Cardiomegaly with mild central interstitial edema. Moderate right and small left pleural effusions. No pneumothorax. IMPRESSION: Cardiomegaly with mild central interstitial edema. Moderate right and small left pleural effusions. Electronically Signed   By: Julian Hy M.D.   On: 08/11/2022 00:11   DG Chest Port 1 View  Result Date: 08/08/2022 CLINICAL DATA:  Shortness of breath EXAM: PORTABLE CHEST 1 VIEW COMPARISON:  08/06/2022 FINDINGS: Cardiac shadow is enlarged but stable. Increasing vascular congestion is noted. Small effusions are noted bilaterally. No pneumothorax is noted. No bony abnormality is seen. IMPRESSION: Worsening CHF. Electronically Signed   By: Inez Catalina M.D.   On: 08/08/2022 18:33   [place] CHEST (PLEURAL EFFUSION)  Result Date: 08/07/2022 CLINICAL DATA:  Right-sided pleural effusion. Please perform chest ultrasound and ultrasound-guided thoracentesis as indicated. EXAM: CHEST ULTRASOUND COMPARISON:  Chest radiograph-08/03/2022; chest CT-08/08/2022 FINDINGS: Sonographic evaluation of the right chest demonstrates a small right-sided pleural effusion, potentially amenable to ultrasound-guided thoracentesis however patient was unable to tolerate positioning for the thoracentesis and refuses intervention at this time. IMPRESSION: Small right-sided pleural effusion, potentially  amenable to ultrasound-guided thoracentesis however patient unable to tolerate positioning  and refuses intervention at this time. No thoracentesis attempted. Electronically Signed   By: Sandi Mariscal M.D.   On: 08/07/2022 08:20   US THORACENTESIS ASP PLEURAL SPACE W/IMG GUIDE  Result Date: 08/06/2022 INDICATION: Shortness of breath with respiratory failure and bilateral pleural effusions. EXAM: ULTRASOUND GUIDED RIGHT THORACENTESIS MEDICATIONS: Local 1% lidocaine only. COMPLICATIONS: None immediate. PROCEDURE: An ultrasound guided thoracentesis was thoroughly discussed with the patient and questions answered. The benefits, risks, alternatives and complications were also discussed. The patient understands and wishes to proceed with the procedure. Written consent was obtained. Ultrasound was performed to localize and mark an adequate pocket of fluid in the right chest. The area was then prepped and draped in the normal sterile fashion. 1% Lidocaine was used for local anesthesia. Under ultrasound guidance a 19 gauge, 10cm Yueh catheter was introduced. Thoracentesis was performed. The catheter was removed and a dressing applied. FINDINGS: A total of approximately 700 cc of clear amber colored fluid was removed. Samples were sent to the laboratory as requested by the clinical team. IMPRESSION: Successful ultrasound guided right thoracentesis yielding 700 cc of pleural fluid. This exam was performed by Tsosie Billing PA-C, and was supervised and interpreted by Dr. Dwaine Gale. Electronically Signed   By: Miachel Roux M.D.   On: 08/06/2022 16:14   DG Chest Port 1 View  Result Date: 08/06/2022 CLINICAL DATA:  Status post right thoracentesis. EXAM: PORTABLE CHEST 1 VIEW COMPARISON:  August 03, 2022. FINDINGS: No pneumothorax is noted status post right thoracentesis. Decreased right pleural effusion is noted. IMPRESSION: No pneumothorax status post right thoracentesis. Electronically Signed   By: Marijo Conception M.D.   On: 08/06/2022 15:20   DG Chest Port 1 View  Result Date: 08/03/2022 CLINICAL DATA:  Dyspnea  EXAM: PORTABLE CHEST 1 VIEW COMPARISON:  Radiograph 07/27/2022 FINDINGS: Unchanged enlarged cardiac silhouette and pulmonary vascular congestion. There are bilateral pleural effusions and lower lung airspace opacities, similar to prior exam. No evidence of pneumothorax. Bones are unchanged. IMPRESSION: Unchanged cardiomegaly and pulmonary vascular congestion with bilateral pleural effusions and lower lung airspace disease, not significantly changed from prior exam Electronically Signed   By: Maurine Simmering M.D.   On: 08/03/2022 12:50   CT HEAD WO CONTRAST (5MM)  Result Date: 08/01/2022 CLINICAL DATA:  Altered mental status EXAM: CT HEAD WITHOUT CONTRAST TECHNIQUE: Contiguous axial images were obtained from the base of the skull through the vertex without intravenous contrast. RADIATION DOSE REDUCTION: This exam was performed according to the departmental dose-optimization program which includes automated exposure control, adjustment of the mA and/or kV according to patient size and/or use of iterative reconstruction technique. COMPARISON:  None Available. FINDINGS: Brain: There is no mass, hemorrhage or extra-axial collection. The size and configuration of the ventricles and extra-axial CSF spaces are normal. There is hypoattenuation of the white matter, most commonly indicating chronic small vessel disease. Vascular: Atherosclerotic calcification of the vertebral and internal carotid arteries at the skull base. No abnormal hyperdensity of the major intracranial arteries or dural venous sinuses. Skull: The visualized skull base, calvarium and extracranial soft tissues are normal. Sinuses/Orbits: No fluid levels or advanced mucosal thickening of the visualized paranasal sinuses. No mastoid or middle ear effusion. The orbits are normal. IMPRESSION: 1. No acute intracranial abnormality. 2. Chronic small vessel disease. Electronically Signed   By: Ulyses Jarred M.D.   On: 08/01/2022 22:29   DG Chest [facility] 1  View  Result  Date: 07/27/2022 CLINICAL DATA:  Congestive failure EXAM: PORTABLE CHEST 1 VIEW COMPARISON:  07/23/2022 FINDINGS: Cardiac shadow is enlarged but stable. Persistent vascular congestion is noted with associated edema. Bilateral effusions are again noted. No bony abnormality is seen. IMPRESSION: No change from the prior exam. Electronically Signed   By: Inez Catalina M.D.   On: 07/27/2022 19:04   ECHOCARDIOGRAM COMPLETE  Result Date: 07/27/2022    ECHOCARDIOGRAM REPORT   Patient Name:   PALYN SCRIMA Date of Exam: 07/27/2022 Medical Rec #:  [id number]             Height:       65.0 in Accession #:    [id number]            Weight:       299.4 lb Date of Birth:  [birth year]             BSA:          2.348 m Patient Age:    55 years              BP:           100/62 mmHg Patient Gender: F                     HR:           100 bpm. Exam Location:  [facility] Procedure: 2D Echo, Cardiac Doppler and Color Doppler Indications:     CHF-acute diastolic C12.75  History:         Patient has prior history of Echocardiogram examinations, most                  recent 03/06/2022. CHF; Risk Factors:Sleep Apnea and Diabetes.  Sonographer:     Sherrie Sport Referring Phys:  [id number] Charlesetta Ivory GONFA Diagnosing Phys: Kathlyn Sacramento MD  Sonographer Comments: Technically challenging study due to limited acoustic windows and no apical window. IMPRESSIONS  1. Left ventricular ejection fraction, by estimation, is 25 to 30%. The left ventricle has severely decreased function. Left ventricular endocardial border not optimally defined to evaluate regional wall motion. The left ventricular internal cavity size  was mildly dilated. There is mild left ventricular hypertrophy. Left ventricular diastolic parameters are indeterminate.  2. Right ventricular systolic function is moderately reduced. The right ventricular size is normal. There is moderately elevated pulmonary artery systolic pressure. The estimated right ventricular systolic  pressure is 94.4 mmHg.  3. Moderate pleural effusion in the left lateral region.  4. The mitral valve is normal in structure. No evidence of mitral valve regurgitation. No evidence of mitral stenosis.  5. The aortic valve is normal in structure. Aortic valve regurgitation is moderate. Aortic valve sclerosis/calcification is present, without any evidence of aortic stenosis.  6. Aortic dilatation noted. 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  7. Moderately dilated pulmonary artery. FINDINGS  Left Ventricle: Left ventricular ejection fraction, by estimation, is 25 to 30%. The left ventricle has severely decreased function. Left ventricular endocardial border not optimally defined to evaluate regional wall motion. The left ventricular internal cavity size was mildly dilated. There is mild left ventricular hypertrophy. Left ventricular diastolic parameters are indeterminate. Right Ventricle: The right ventricular size is normal. No increase in right ventricular wall thickness. Right ventricular systolic function is moderately reduced. There is moderately elevated pulmonary artery systolic pressure. The tricuspid regurgitant velocity is 3.19 m/s, and with an assumed right  atrial pressure of 10 mmHg, the estimated right ventricular systolic pressure is 07.6 mmHg. Left Atrium: Left atrial size was normal in size. Right Atrium: Right atrial size was normal in size. Pericardium: There is no evidence of pericardial effusion. Mitral Valve: The mitral valve is normal in structure. No evidence of mitral valve regurgitation. No evidence of mitral valve stenosis. Tricuspid Valve: The tricuspid valve is normal in structure. Tricuspid valve regurgitation is mild . No evidence of tricuspid stenosis. Aortic Valve: The aortic valve is normal in structure. Aortic valve regurgitation is moderate. Aortic valve sclerosis/calcification is present, without any evidence of aortic stenosis.  Pulmonic Valve: The pulmonic valve was normal in structure. Pulmonic valve regurgitation is mild. No evidence of pulmonic stenosis. Aorta: Aortic dilatation noted. 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 Pulmonary Artery: The pulmonary artery is moderately dilated. Venous: The inferior vena cava was not well visualized. IAS/Shunts: No atrial level shunt detected by color flow Doppler. Additional Comments: There is a moderate pleural effusion in the left lateral region.  LEFT VENTRICLE PLAX 2D LVIDd:         5.80 cm LVIDs:         5.20 cm LV PW:         0.80 cm LV IVS:        1.20 cm LVOT diam:     2.20 cm LVOT Area:     3.80 cm  LEFT ATRIUM         Index LA diam:    2.60 cm 1.11 cm/m   AORTA Ao Root diam: 5.40 cm TRICUSPID VALVE TR Peak grad:   40.7 mmHg TR Vmax:        319.00 cm/s  SHUNTS Systemic Diam: 2.20 cm Kathlyn Sacramento MD Electronically signed by Kathlyn Sacramento MD Signature Date/Time: 07/27/2022/1:22:54 PM    Final    US RENAL  Result Date: 07/25/2022 CLINICAL DATA:  226333 AKI (acute kidney injury) ([place]) [id number] EXAM: RENAL / URINARY TRACT ULTRASOUND COMPLETE COMPARISON:  May 20, 2022 FINDINGS: Evaluation is limited by body habitus, suboptimal acoustic windows and bowel gas. Right Kidney: Renal measurements: 10.5 by 6.2 x 5.9 cm = volume: 198 mL. Echogenicity within normal limits. No suspicious mass or hydronephrosis visualized. Benign cyst of the superior pole measures 14 mm (for which no dedicated imaging follow-up is recommended). There is an additional 10 mm benign cyst of the inferior pole (for which no dedicated imaging follow-up is recommended). Left Kidney: Not visualized secondary to limitations described above. Bladder: Not visualized. Other: Small volume ascites is noted. Incidental note of a LEFT pleural effusion. IMPRESSION: 1. Evaluation is limited by body habitus, suboptimal acoustic windows and bowel gas. The left kidney and bladder  were not visualized sonographically due to these limitations. 2. No evidence of RIGHT-sided hydronephrosis. 3. Incidental note of LEFT pleural effusion and small volume ascites. Electronically Signed   By: Valentino Saxon M.D.   On: 07/25/2022 11:57   DG Chest Port 1 View  Result Date: 07/23/2022 CLINICAL DATA:  [id number] SOB (shortness of breath) [id number] EXAM: PORTABLE CHEST 1 VIEW COMPARISON:  July 17, 2022 FINDINGS: The cardiomediastinal silhouette is unchanged and enlarged in contour. Small bilateral pleural effusions, similar comparison to prior. No pneumothorax. Perihilar vascular congestion, peribronchial cuffing, vascular indistinctness and bibasilar reticulonodular opacities favored to reflect pulmonary edema. LEFT retrocardiac homogeneous opacity, likely atelectasis. Advanced degenerative changes of the RIGHT shoulder. IMPRESSION: Constellation of findings are favored to reflect  increased pulmonary edema with small bilateral pleural effusions and basilar atelectasis. Electronically Signed   By: Valentino Saxon M.D.   On: 07/23/2022 09:13   CT Angio Chest PE W and/or Wo Contrast  Result Date: 08/11/2022 CLINICAL DATA:  Pain and shortness of breath. EXAM: CT ANGIOGRAPHY CHEST WITH CONTRAST TECHNIQUE: Multidetector CT imaging of the chest was performed using the standard protocol during bolus administration of intravenous contrast. Multiplanar CT image reconstructions and MIPs were obtained to evaluate the vascular anatomy. RADIATION DOSE REDUCTION: This exam was performed according to the departmental dose-optimization program which includes automated exposure control, adjustment of the mA and/or kV according to patient size and/or use of iterative reconstruction technique. CONTRAST:  2m OMNIPAQUE IOHEXOL 350 MG/ML SOLN COMPARISON:  None Available. FINDINGS: Cardiovascular: There is mild to moderate severity calcification of the aortic arch and descending thoracic aorta. The ascending thoracic  aorta is dilated and measures 5.8 cm x 5.4 cm, proximally. Satisfactory opacification of the pulmonary arteries to the segmental level. No evidence of pulmonary embolism. The main pulmonary artery measures 3.9 cm. There is moderate severity cardiomegaly with moderate severity coronary artery calcification. No pericardial effusion. Mediastinum/Nodes: No enlarged mediastinal, hilar, or axillary lymph nodes. Thyroid gland, trachea, and esophagus demonstrate no significant findings. Lungs/Pleura: Mild to moderate severity patchy ground-glass appearance of the lung parenchyma is noted. Moderate to marked severity atelectasis and/or infiltrate is seen within the bilateral lower lobes. There are small to moderate size bilateral pleural effusions. No pneumothorax is identified. Upper Abdomen: A 14 mm x 12 mm mildly hyperdense, exophytic lesion is seen along the lateral aspect of the upper pole of the right kidney (axial CT image 182, CT series 5). Musculoskeletal: Multilevel degenerative changes seen throughout the thoracic spine. Review of the MIP images confirms the above findings. IMPRESSION: 1. No evidence of pulmonary embolism. 2. Moderate to marked severity bilateral lower lobe atelectasis and/or infiltrate. 3. Small to moderate size bilateral pleural effusions. 4. Moderate severity cardiomegaly with moderate severity coronary artery calcification. 5. Dilated ascending thoracic aorta. 6. Dilated main pulmonary artery which may represent pulmonary arterial hypertension. 7. 14 mm x 12 mm mildly hyperdense, exophytic lesion along the lateral aspect of the upper pole of the right kidney. While this may represent a hemorrhagic cyst, correlation with nonemergent renal ultrasound is recommended. 8. Aortic atherosclerosis. Aortic Atherosclerosis (ICD10-I70.0). Electronically Signed   By: TVirgina NorfolkM.D.   On: 07/20/2022 18:40   DG Chest 2 View  Result Date: 08/02/2022 CLINICAL DATA:  Shortness of breath EXAM: CHEST  - 2 VIEW COMPARISON:  Previous studies including the examination of 07/16/2022 FINDINGS: Transverse diameter of heart is increased. Central pulmonary vessels are less prominent. There is interval improvement in aeration in both lungs. There is residual increase in interstitial markings in parahilar regions and lower lung fields. There is increased density in left lower lung field partly obscuring the left hemidiaphragm. There is blunting of left lateral CP angle. There is no pneumothorax. IMPRESSION: There is partial clearing of pulmonary edema. Residual increased density is seen in both lower lung fields, more so on the left side suggesting a small pleural effusions and underlying pulmonary edema or pneumonia, more so on the left side. Electronically Signed   By: PElmer PickerM.D.   On: 07/30/2022 17:09   DG Chest Port 1 View  Result Date: 07/16/2022 CLINICAL DATA:  10027 Tachypnea 10027 EXAM: PORTABLE CHEST 1 VIEW COMPARISON:  Chest x-ray 07/08/2022 FINDINGS: Persistently enlarged cardiac silhouette. The heart and  mediastinal contours are within normal limits. Prominent hilar vasculature. No focal consolidation. Increased marking. No pleural effusion. No pneumothorax. No acute osseous abnormality. IMPRESSION: Cardiomegaly with pulmonary edema. Electronically Signed   By: Iven Finn M.D.   On: 07/16/2022 20:54    Microbiology No results found for this or any previous visit (from the past 240 hour(s)).  Lab Basic Metabolic Panel: Recent Labs  Lab 08/06/22 0846 08/07/22 0753 08/08/22 0900 08/09/22 0752 08/10/22 1021 08/11/22 0822  NA 141 139 139 137 140 140  K 4.2 4.0 4.0 3.9 4.1 3.8  CL 98 94* 94* 96* 95* 97*  CO2 31 31 34* 33* 34* 35*  GLUCOSE 78 83 88 73 96 85  BUN 74* 74* 72* 69* 65* 67*  CREATININE 2.26* 2.26* 2.17* 2.18* 1.87* 1.80*  CALCIUM 8.4* 8.3* 8.2* 8.4* 8.8* 8.3*  MG 2.7*  --   --  2.5* 2.5*  --    Liver Function Tests: No results for input(s): "AST", "ALT",  "ALKPHOS", "BILITOT", "PROT", "ALBUMIN" in the last 168 hours. No results for input(s): "LIPASE", "AMYLASE" in the last 168 hours. No results for input(s): "AMMONIA" in the last 168 hours. CBC: Recent Labs  Lab 08/06/22 0846 08/07/22 0758 08/08/22 0900 08/10/22 1021  WBC 10.3 9.4 10.1 9.3  NEUTROABS 8.5* 7.5 8.0* 7.6  HGB 11.9* 11.7* 11.0* 12.0  HCT 42.3 40.8 38.6 42.8  MCV 89.1 88.3 88.3 90.3  PLT 493* 468* 465* 424*   Cardiac Enzymes: No results for input(s): "CKTOTAL", "CKMB", "CKMBINDEX", "TROPONINI" in the last 168 hours. Sepsis Labs: Recent Labs  Lab 08/06/22 0846 08/07/22 0758 08/08/22 0900 08/10/22 1021  WBC 10.3 9.4 10.1 9.3    Procedures/Operations  1/25: Thoracentesis 09-03-22: Endotracheal intubation (placed during code blue) 09/03/22: Right IJ CVC placed   Darel Hong, AGACNP-BC [facility] [place] epic messenger for cross cover needs If after hours, please call E-link  Bradly Bienenstock Sep 03, 2022, 4:32 PM

## 2022-08-13 NOTE — Significant Event (Signed)
Rapid Response Event Note   Reason for Call :  CODE BLUE  Initial Focused Assessment:   PT receiving chest compressions as I came into the room-     Interventions:  Chest compressions- pt in PEA- a total of '3mg'$  epi was given- then patient had ROSC.  Plan of Care:  Pt transferred to ICU 17. Pt had line and foley placed- family updated.   Event Summary:   MD Notified:  Call Time: Arrival Time: End Time:  Reggie Pile, RN

## 2022-08-13 NOTE — Consult Note (Signed)
[facility] Nurse Consult Note: Reason for Consult: Sacral rash and areas of more deeply hued tissue not consistent with deep tissue pressure injury, irritant contact dermatitis in the deep skin folds of the subpannicular areas and inframammary areas. Ischial tuberosities with macerated epidermis without skin loss. Wound type: irritant contact dermatitis, specifically related to urinary incontinence and perspiration. Respiratory status dictates HOB elevation at least to 30 degrees and occasionally higher. Patient does not independently initiate position changes.  ICD-10 CM Codes for Irritant Dermatitis  L24A2 - Due to fecal, urinary or dual incontinence L24A9 - Due to friction or contact with other specified body fluids L30.4  - Erythema intertrigo. Also used for abrasion of the hand, chafing of the skin, dermatitis due to sweating and friction, friction dermatitis, friction eczema, and genital/thigh intertrigo.   Pressure Injury POA: N/A Measurement: N/A Wound bed:N/A Drainage (amount, consistency, odor) N/A Periwound: Rash at sacrum is confluent center with scattered satellite lesions, but not fungal, area on right sacral area with maroon patch, left sacral area with purple bruise (not consistent with DTPI). Maceration noted at left ischial tuberosity without wounding. Dressing procedure/placement/frequency: I will provide a bariatric mattress replacement with low air loss feature as well as bilateral pressure redistribution heel boots and a pressure redistribution chair pad for use when OOB in chair. Guidance has been provided for turning and repositioning, HOB elevation, placement of a sacral foam over the sacrum and moisture barrier protection to the ischial tuberosities, buttocks, perineum and medial and posterior thighs. DermaTherapy therapeutic and moisture wicking bed linen system with a low friction coefficient of friction (CoF) is in use.  POC discussed with friend who is at bedside who is in  agreement with interventions.  [place] nursing team will not follow, but will remain available to this patient, the nursing and medical teams.  Please re-consult if needed.  Thank you for inviting [place] to participate in this patient's Plan of Care.  Maudie Flakes, MSN, RN, CNS, [place], Serita Grammes, [employer], [employer] phone:  [phone]

## 2022-08-13 NOTE — Consult Note (Signed)
NAME:  Madison Mcbride, MRN:  [id number], DOB:  Sep 19, 1955, LOS: 66 ADMISSION DATE:  08/06/2022, CONSULTATION DATE:  08-19-22 REFERRING MD:  Dr. Hollice Gong, CHIEF COMPLAINT:  In-Hospital PEA Cardiac Arrest   Brief Pt Description / Synopsis:  67 y.o. female initially admitted with Acute on Chronic Hypoxic & Hypercapnic Respiratory Failure due to Acute Decompensated HFrEF and bilateral pleural effusions requiring frequent BiPAP, Atrial fibrillation with RVR, and AKI on CKD Stage III.  Hospital course complicated by brief in-hospital PEA Cardiac Arrest, suspect respiratory etiology requiring intubation and mechanical ventilation.  History of Present Illness:  67 y.o. female with medical history significant for morbid obesity, chronic diastolic CHF, hypothyroidism, type II DM, obstructive sleep apnea, lymphedema, CKD stage IIIa, OSA, chronic hypoxic and hypercapnic respiratory failure on 2 L/min oxygen and trilogy machine at night.  Presented to [facility] ED 07/11/2022 from a skilled nursing facility out of concern for tachycardia, heart rates on admission were in the 130s, Presented with leukocytosis to 12.6K, UA concerning for cystitis. She initially met sepsis criteria, started on IV meropenem for treatment of cystitis due to history of ESBL and IV diltiazem for AF RVR.  Discharge back to SNF was planned to the afternoon of 07/15/2021, but the patient was tachycardic, and the SNF refused to accept her   Respiratory status has been very tenuous during this admission.  She is constantly short of breath.  Conversational dyspnea even with minimal exertion.  Even eating causes her dyspnea worsen.  We attempted thoracentesis on 1/24.  Unfortunately patient was unable to tolerate.  After premedication patient was able to tolerate procedure on 1/25.  700 cc fluid removed.   Rapid response called overnight 1/30 for chest pain and confusion.  Seem to be more hospital-acquired delirium.  Patient was confused and  apparently thought her husband might be cheating on her.  Workup negative.  Chest pain improved.  Remains on 4 L nasal cannula.  On Aug 19, 2022 she had been weaned off BiPAP to 4L [place] and seemed to be tolerating.  However while being bathed she suffered a brief PEA Cardiac Arrest.  Report is that she became unresponsive without pulses of which CPR was initiated and she received rounds of Epi with ROSC obtained about 5 minutes of ACLS.  She was intubated by ED provider, and transferred to ICU.  PCCM consulted.  Please see "significant hospital events" section below for full detailed hospital course.   Pertinent  Medical History   Past Medical History:  Diagnosis Date   CHF (congestive heart failure) (HCC)    Diabetes mellitus without complication (HCC)    Hypothyroidism    Lymphedema    RLS (restless legs syndrome)    Sleep apnea    Thyroid cancer ([place]) 2007    Micro Data:  07/08/22: SARS-CoV-2/Flu/RSV PCR>>negative 07/08/22: Blood culture x2>> no growth 07/13/22: Urine>>no growth 08/04/2022: HIV screen>>negative 07/13/2022: Blood culture x2>> no growth 07/18/22: Respiratory viral panel>> negative 07/18/22: MRSA PCR>>negative 07/23/22: Urine>>no growth  Antimicrobials:   Anti-infectives (From admission, onward)    Start     Dose/Rate Route Frequency Ordered Stop   07/25/22 1200  amoxicillin-clavulanate (AUGMENTIN) 500-125 MG per tablet 1 tablet        1 tablet Oral 2 times daily 07/25/22 1102 07/26/22 2117   07/24/22 1400  Ampicillin-Sulbactam (UNASYN) 3 g in sodium chloride 0.9 % 100 mL IVPB  Status:  Discontinued        3 g 200 mL/hr over 30 Minutes Intravenous Every 6 hours  07/24/22 1228 07/24/22 1231   07/24/22 1330  amoxicillin-clavulanate (AUGMENTIN) 875-125 MG per tablet 1 tablet  Status:  Discontinued        1 tablet Oral Every 12 hours 07/24/22 1231 07/25/22 1102   07/21/22 1500  Ampicillin-Sulbactam (UNASYN) 3 g in sodium chloride 0.9 % 100 mL IVPB        3 g 200 mL/hr over 30 Minutes  Intravenous Every 6 hours 07/21/22 1357 07/22/22 2216   07/18/22 1900  azithromycin (ZITHROMAX) 500 mg in sodium chloride 0.9 % 250 mL IVPB  Status:  Discontinued        500 mg 250 mL/hr over 60 Minutes Intravenous Every 24 hours 07/18/22 0205 07/19/22 1204   07/18/22 1000  piperacillin-tazobactam (ZOSYN) IVPB 3.375 g  Status:  Discontinued        3.375 g 12.5 mL/hr over 240 Minutes Intravenous Every 8 hours 07/18/22 0842 07/21/22 1357   07/18/22 0800  cefTRIAXone (ROCEPHIN) 1 g in sodium chloride 0.9 % 100 mL IVPB  Status:  Discontinued        1 g 200 mL/hr over 30 Minutes Intravenous Every 24 hours 07/18/22 0205 07/18/22 0829   07/22/2022 1915  vancomycin (VANCOREADY) IVPB 2000 mg/400 mL        2,000 mg 200 mL/hr over 120 Minutes Intravenous  Once 07/24/2022 1914 08/05/2022 2250   07/23/2022 1900  ceFEPIme (MAXIPIME) 2 g in sodium chloride 0.9 % 100 mL IVPB        2 g 200 mL/hr over 30 Minutes Intravenous  Once 08/03/2022 1853 07/18/22 1900   07/14/2022 1900  azithromycin (ZITHROMAX) 500 mg in sodium chloride 0.9 % 250 mL IVPB        500 mg 250 mL/hr over 60 Minutes Intravenous  Once 08/08/2022 1853 07/18/22 1900        Significant Hospital Events: Including procedures, antibiotic start and stop dates in addition to other pertinent events   07/12/2022: Admitted to [facility] for treatment of UTI 08/06/2022: Discharged to SNF 08/11/2022: Presented back to ED same day of discharge due to tachycardia and SOB. Admitted for A.fib w/ RVR, Acute CHF exacerbation and suspected pneumonia. 1/24: Attempt at thoracentesis, but pt unable to tolerate 1/25: Able to obtain thoracentesis after premedication 1/30: Rapid response called late last night for chest pain and confusion. Seem to be more hospital-acquired delirium. Patient was confused and apparently thought her husband might be cheating on her. Workup negative. Chest pain improved. Remains on 4 L nasal cannula.  08-23-22: Suffered brief in-hospital PEA Cardia arrest  (approximately 5 minutes, suspect respiratory etiology).  Intubated and placed on mechanical ventilation, PCCM consulted.  Code status changed to DNR, family is considering comfort measures.  Interim History / Subjective:  -Pt transferred to ICU following brief PEA cardiac arrest (estimated 5 minutes, 2 rounds of epi) ~ was intubated by ED provider during code -Pt currently off sedation, starting to withdraw from pain and cough, but not currently following commands -Critically ill, hypotensive and bradycardic with poor peripheral IV access ~ will place central line emergently -Workup for cardiac arrest currently pending -Pt needs CT Head, but currently is too hemodynamically unstable (bradycardia in 40's, hypotensive, on 100% FiO2 and 10 PEEP on vent) ~ will obtain once hemodynamics improved  Objective   Blood pressure 94/63, pulse 82, temperature 98.7 F (37.1 C), temperature source Axillary, resp. rate (!) 24, height '5\' 5"'$  (1.651 m), weight 133.6 kg, SpO2 91 %.    FiO2 (%):  [36 %-40 %]  40 %   Intake/Output Summary (Last 24 hours) at 08-29-2022 1305 Last data filed at 2022/08/29 1144 Gross per 24 hour  Intake 480 ml  Output 650 ml  Net -170 ml   Filed Weights   08/06/22 1733 08/11/22 0500 08-29-22 0100  Weight: 134 kg 132.9 kg 133.6 kg    Examination: General: Critically ill appearing on chronically ill appearing female, intubated, on no sedation, currently unresponsive HENT: Atraumatic, normocephalic, neck supple, difficult to assess JVD due to body habitus Lungs: Coarse breath sounds bilaterally, no wheezing or rales, synchronous with the vent, even Cardiovascular: Bradycardia, regular rhythm, s1s2, no M/R/G Abdomen: Obese, soft, nontender, no guarding or rebound tenderness Extremities: Normal bulk and tone, no deformities, chronic tropic changes to BLE Neuro: Unresponsive on no sedation, starting to withdraw from pain, cough/gag reflex intact, pupils PERRL (4 mm sluggish  bilaterally) GU: Foley catheter placed by nursing draining yellow form  Resolved Hospital Problem list     Assessment & Plan:   #In Hospital PEA Cardiac Arrest, suspect respiratory etiology #Shock: ? Hypovolemic in setting of diuresis vs ? Cardiogenic #Acute on Chronic HFrEF #Atrial Fibrillation w/ RVR Echocardiogram 07/27/22: LVEF 25-30%, indeterminate diastolic parameters, moderately reduced right ventricular systolic function, moderately elevated pulmonary hypertension -Continuous cardiac monitoring and serial EKG's -Maintain MAP >65 -Vasopressors as needed to maintain MAP goal -Continue Midodrine -Trend lactic acid until normalized -Trend HS Troponin until peaked -Hold Metoprolol, Imdur -Will reconsult Cardiology  #Acute on Chronic Hypoxic & Hypercapnic Respiratory Failure in setting of Acute Decompensated CHF & bilateral Pleural Effusions (R>L) PMHx: COPD requiring 2L supplemental O2 at baseline, OHS, OSA S/p Thoracentesis on 08/06/22 (700 cc fluid removed) -Full vent support, implement lung protective strategies -Plateau pressures less than 30 cm H20 -Wean FiO2 & PEEP as tolerated to maintain O2 sats >92% -Follow intermittent Chest X-ray & ABG as needed -Spontaneous Breathing Trials when respiratory parameters met and mental status permits -Implement VAP Bundle -Bronchodilators and Pulmicort nebs -Diuresis as BP and renal function permits ~ currently holding due to hypotension and pressors  #AKI on CKD Stage IIIa -Monitor I&O's / urinary output -Follow BMP -Ensure adequate renal perfusion -Avoid nephrotoxic agents as able -Replace electrolytes as indicated  #Pneumonia ~ TREATED -Monitor fever curve -Trend WBC's & Procalcitonin -Follow cultures as above -Completed course of ABX on 01/15/22  #Acute Metabolic Encephalopathy #Sedation needs in setting of mechanical ventilation -Maintain a RASS goal of 0 to -1 -Fentanyl and Propofol as needed to maintain RASS  goal -Avoid sedating medications as able -Daily wake up assessment -Pt needs CT Head, but currently too hemo dynamically unstable ~ will obtain once more stable     Pt is critically ill (s/p in hospital cardiac arrest) superimposed on multiple co-morbidities (DM, COPD, OSA/OHS, restless leg syndrome, hypothyroidism) with prolonged hospitalization.  High risk for further decompensation, cardiac arrest, and death.  Recommend DNR status.  Pt's family is considering transition to comfort measures.    Best Practice (right click and "Reselect all SmartList Selections" daily)   Diet/type: NPO DVT prophylaxis: DOAC GI prophylaxis: PPI Lines: Central line and yes and it is still needed Foley:  Yes, and it is still needed Code Status:  DNR Last date of multidisciplinary goals of care discussion [N/A]  29-Aug-2022: Pt's husband and son updated at bedside by Dr. Mortimer Fries.  Labs   CBC: Recent Labs  Lab 08/06/22 0846 08/07/22 0758 08/08/22 0900 08/10/22 1021  WBC 10.3 9.4 10.1 9.3  NEUTROABS 8.5* 7.5 8.0* 7.6  HGB  11.9* 11.7* 11.0* 12.0  HCT 42.3 40.8 38.6 42.8  MCV 89.1 88.3 88.3 90.3  PLT 493* 468* 465* 424*    Basic Metabolic Panel: Recent Labs  Lab 08/06/22 0846 08/07/22 0753 08/08/22 0900 08/09/22 0752 08/10/22 1021 08/11/22 0822  NA 141 139 139 137 140 140  K 4.2 4.0 4.0 3.9 4.1 3.8  CL 98 94* 94* 96* 95* 97*  CO2 31 31 34* 33* 34* 35*  GLUCOSE 78 83 88 73 96 85  BUN 74* 74* 72* 69* 65* 67*  CREATININE 2.26* 2.26* 2.17* 2.18* 1.87* 1.80*  CALCIUM 8.4* 8.3* 8.2* 8.4* 8.8* 8.3*  MG 2.7*  --   --  2.5* 2.5*  --    GFR: Estimated Creatinine Clearance: 41.4 mL/min (A) (by C-G formula based on SCr of 1.8 mg/dL (H)). Recent Labs  Lab 08/06/22 0846 08/07/22 0758 08/08/22 0900 08/10/22 1021  WBC 10.3 9.4 10.1 9.3    Liver Function Tests: No results for input(s): "AST", "ALT", "ALKPHOS", "BILITOT", "PROT", "ALBUMIN" in the last 168 hours. No results for input(s): "LIPASE",  "AMYLASE" in the last 168 hours. No results for input(s): "AMMONIA" in the last 168 hours.  ABG    Component Value Date/Time   PHART 7.35 08/02/2022 1116   PCO2ART 59 (H) 08/02/2022 1116   PO2ART 83 08/02/2022 1116   HCO3 42.2 (H) 08/11/2022 0013   O2SAT 80.8 08/11/2022 0013     Coagulation Profile: No results for input(s): "INR", "PROTIME" in the last 168 hours.  Cardiac Enzymes: No results for input(s): "CKTOTAL", "CKMB", "CKMBINDEX", "TROPONINI" in the last 168 hours.  HbA1C: Hgb A1c MFr Bld  Date/Time Value Ref Range Status  07/08/2022 07:24 PM 5.3 4.8 - 5.6 % Final    Comment:    (NOTE)         Prediabetes: 5.7 - 6.4         Diabetes: >6.4         Glycemic control for adults with diabetes: <7.0   03/06/2022 04:04 AM 6.4 (H) 4.8 - 5.6 % Final    Comment:    (NOTE) Pre diabetes:          5.7%-6.4%  Diabetes:              >6.4%  Glycemic control for   <7.0% adults with diabetes     CBG: Recent Labs  Lab 08/11/22 2340 August 26, 2022 0143 08/26/22 0426 08-26-2022 0753 August 26, 2022 1248  GLUCAP 98 77 101* 66* 109*    Review of Systems:   Unable to assess due to AMS/intubation/sedation/critical illness   Past Medical History:  She,  has a past medical history of CHF (congestive heart failure) ([place]), Diabetes mellitus without complication ([place]), Hypothyroidism, Lymphedema, RLS (restless legs syndrome), Sleep apnea, and Thyroid cancer ([place]) (2007).   Surgical History:   Past Surgical History:  Procedure Laterality Date   ABDOMINAL HYSTERECTOMY     COLONOSCOPY WITH PROPOFOL N/A 08/26/2015   Procedure: COLONOSCOPY WITH PROPOFOL;  Surgeon: Manya Silvas, MD;  Location: [facility] ENDOSCOPY;  Service: Endoscopy;  Laterality: N/A;   THYROIDECTOMY       Social History:   reports that she has never smoked. She has never used smokeless tobacco. She reports that she does not drink alcohol and does not use drugs.   Family History:  Her family history includes Autoimmune  disease in her sister. There is no history of Breast cancer.   Allergies No Known Allergies   Home Medications  Prior to Admission  medications   Medication Sig Start Date End Date Taking? Authorizing Provider  acetaminophen (TYLENOL) 650 MG CR tablet Take 1,300 mg by mouth every 8 (eight) hours as needed for pain.   Yes [provider]  ALPRAZolam (XANAX) 0.25 MG tablet Take 1 tablet (0.25 mg total) by mouth 3 (three) times daily as needed for anxiety. 05/29/22  Yes Sreenath, Trula Slade, MD  apixaban (ELIQUIS) 5 MG TABS tablet Take 1 tablet (5 mg total) by mouth 2 (two) times daily. 07/25/21  Yes Dessa Phi, DO  atorvastatin (LIPITOR) 20 MG tablet Take 20 mg by mouth daily.   Yes [provider]  Biotin 5 MG TABS Take 2.5 mg by mouth daily.   Yes [provider]  buPROPion (WELLBUTRIN XL) 150 MG 24 hr tablet Take 150 mg by mouth daily.   Yes [provider]  Catheter Self-Adhesive Urinary MISC Continue PurWIck prn if available 03/18/22  Yes Emeterio Reeve, DO  Cholecalciferol 25 MCG (1000 UT) tablet Take 1 tablet by mouth daily.   Yes [provider]  cyclobenzaprine (FLEXERIL) 5 MG tablet Take 5 mg by mouth 3 (three) times daily as needed for muscle spasms.   Yes [provider]  diltiazem (CARDIZEM) 60 MG tablet Take 1 tablet (60 mg total) by mouth every 8 (eight) hours. 07/20/2022  Yes Richarda Osmond, MD  furosemide (LASIX) 40 MG tablet Take 1 tablet (40 mg total) by mouth 2 (two) times daily. Increase to 1 tablet (40 mg total) by mouth THREE TIMES daily (total daily dose 120 mg) as needed for up to 3 days for increased leg swelling, shortness of breath, weight gain 5+ lbs over 1-2 days. Seek medical care if these symptoms are not improving with increased dose. Reduce dose to 1 tablet (40 mg total) by mouth ONCE DAILY if dizziness or low blood pressure on bid dose. 03/18/22  Yes Emeterio Reeve, DO  gabapentin (NEURONTIN) 100 MG  capsule Take 2 capsules (200 mg total) by mouth 3 (three) times daily. 05/29/22  Yes Sreenath, Sudheer B, MD  Iron, Ferrous Sulfate, 325 (65 Fe) MG TABS Take 325 mg by mouth daily at 12 noon. 05/29/22  Yes Sreenath, Sudheer B, MD  levothyroxine (SYNTHROID) 125 MCG tablet Take 2 tablets (250 mcg total) by mouth daily. Takes two tablets for a total of 250 mcg daily. 07/15/22  Yes Richarda Osmond, MD  metolazone (ZAROXOLYN) 2.5 MG tablet Take 2.5 mg by mouth daily as needed (if weight up > 5 pounds).   Yes [provider]  Metoprolol Tartrate 75 MG TABS Take 75 mg by mouth 2 (two) times daily. 07/26/2022  Yes Richarda Osmond, MD  Mouthwashes (MOUTH RINSE) LIQD solution 15 mLs by Mouth Rinse route as needed (oral care). 03/18/22  Yes Emeterio Reeve, DO  Multiple Vitamin (MULTIVITAMIN) tablet Take 1 tablet by mouth daily.   Yes [provider]  naloxone (NARCAN) nasal spray 4 mg/0.1 mL Place 1 spray into the nose as needed (opioid overdose).   Yes [provider]  pantoprazole (PROTONIX) 40 MG tablet Take 40 mg by mouth daily. 01/11/21  Yes [provider]  polyethylene glycol (MIRALAX / GLYCOLAX) 17 g packet Take 17 g by mouth daily as needed for moderate constipation. 03/18/22  Yes Emeterio Reeve, DO  potassium chloride (KLOR-CON) 20 MEQ packet Take 20 mEq by mouth daily.   Yes [provider]  rOPINIRole (REQUIP) 3 MG tablet Take 1 tablet (3 mg total) by mouth  at bedtime. 03/18/22  Yes Emeterio Reeve, DO  Semaglutide, 1 MG/DOSE, 2 MG/1.5ML SOPN Inject 0.75 mLs into the skin once a week. 10/14/20  Yes [provider]  traMADol (ULTRAM) 50 MG tablet Take 50 mg by mouth every 6 (six) hours as needed for moderate pain.   Yes [provider]  zinc gluconate 50 MG tablet Take 50 mg by mouth daily.   Yes [provider]     Critical care time: 50 minutes     Darel Hong, AGACNP-BC [facility] [place]  epic messenger for cross cover needs If after hours, please call E-link

## 2022-08-13 NOTE — Progress Notes (Addendum)
PT Cancellation Note  Patient Details Name: Madison Mcbride MRN: [id number] DOB: June 28, 1956   Cancelled Treatment:    Reason Eval/Treat Not Completed: Other (comment). Chart reviewed, pt with unstable medical status, code blue called. Will re-attempt another date if medically stable.  Addendum: Transfer to ICU post code. Secondary to change in status, will need new orders to resume care. Please reorder when medically stable.  Lakitha Gordy 08/23/22, 12:51 PM Greggory Stallion, PT, DPT, [facility] [phone]

## 2022-08-13 NOTE — Progress Notes (Signed)
OT Cancellation Note  Patient Details Name: Madison Mcbride MRN: [id number] DOB: January 06, 1956   Cancelled Treatment:    Reason Eval/Treat Not Completed: Other (comment). Chart reviewed, pt with unstable medical status, code blue called. Pt also noted to have transferred to higher level of care (ICU). Will need new orders or "continue at transfer" orders before seeing pt for therapy again. Will continue to follow acutely.   Doneta Public 08-27-2022, 1:35 PM

## 2022-08-13 NOTE — Procedures (Signed)
Central Venous Catheter Insertion Procedure Note  Marley Pakula  [id number]  02-Nov-1955  Date:August 21, 2022  Time:2:02 PM   Provider Performing:Ethelyne Erich D Dewaine Conger   Procedure: Insertion of Non-tunneled Central Venous Catheter(36556) with US guidance (94801)   Indication(s) Medication administration and Difficult access  Consent Unable to obtain consent due to emergent nature of procedure.  Anesthesia Topical only with 1% lidocaine   Timeout Verified patient identification, verified procedure, site/side was marked, verified correct patient position, special equipment/implants available, medications/allergies/relevant history reviewed, required imaging and test results available.  Sterile Technique Maximal sterile technique including full sterile barrier drape, hand hygiene, sterile gown, sterile gloves, mask, hair covering, sterile ultrasound probe cover (if used).  Procedure Description Area of catheter insertion was cleaned with chlorhexidine and draped in sterile fashion.  With real-time ultrasound guidance a central venous catheter was placed into the right internal jugular vein. Nonpulsatile blood flow and easy flushing noted in all ports.  The catheter was sutured in place and sterile dressing applied.  Complications/Tolerance None; patient tolerated the procedure well. Chest X-ray is ordered to verify placement for internal jugular or subclavian cannulation.   Chest x-ray is not ordered for femoral cannulation.  EBL Minimal  Specimen(s) None   Line secured at the 16 cm mark. BIOPATCH applied to the insertion site.   Darel Hong, AGACNP-BC [facility] Prefer epic messenger for cross cover needs If after hours, please call E-link

## 2022-08-13 DEATH — deceased
